# Patient Record
Sex: Male | Born: 1945 | Race: White | Hispanic: No | Marital: Single | Smoking: Former smoker
Health system: Southern US, Community
[De-identification: ages and names within clinical notes are randomized; demographics above are authoritative.]

## PROBLEM LIST (undated history)

## (undated) DIAGNOSIS — I340 Nonrheumatic mitral (valve) insufficiency: Secondary | ICD-10-CM

## (undated) DIAGNOSIS — I10 Essential (primary) hypertension: Secondary | ICD-10-CM

## (undated) DIAGNOSIS — I509 Heart failure, unspecified: Secondary | ICD-10-CM

## (undated) DIAGNOSIS — I5020 Unspecified systolic (congestive) heart failure: Secondary | ICD-10-CM

## (undated) HISTORY — PX: NO PAST SURGERIES: SHX2092

---

## 2020-06-16 DIAGNOSIS — M62830 Muscle spasm of back: Secondary | ICD-10-CM | POA: Diagnosis not present

## 2020-06-16 DIAGNOSIS — M9901 Segmental and somatic dysfunction of cervical region: Secondary | ICD-10-CM | POA: Diagnosis not present

## 2020-06-16 DIAGNOSIS — M9903 Segmental and somatic dysfunction of lumbar region: Secondary | ICD-10-CM | POA: Diagnosis not present

## 2020-06-16 DIAGNOSIS — M5033 Other cervical disc degeneration, cervicothoracic region: Secondary | ICD-10-CM | POA: Diagnosis not present

## 2020-07-07 DIAGNOSIS — M9903 Segmental and somatic dysfunction of lumbar region: Secondary | ICD-10-CM | POA: Diagnosis not present

## 2020-07-07 DIAGNOSIS — M5033 Other cervical disc degeneration, cervicothoracic region: Secondary | ICD-10-CM | POA: Diagnosis not present

## 2020-07-07 DIAGNOSIS — M9901 Segmental and somatic dysfunction of cervical region: Secondary | ICD-10-CM | POA: Diagnosis not present

## 2020-07-07 DIAGNOSIS — M62830 Muscle spasm of back: Secondary | ICD-10-CM | POA: Diagnosis not present

## 2020-07-28 DIAGNOSIS — M62830 Muscle spasm of back: Secondary | ICD-10-CM | POA: Diagnosis not present

## 2020-07-28 DIAGNOSIS — M5033 Other cervical disc degeneration, cervicothoracic region: Secondary | ICD-10-CM | POA: Diagnosis not present

## 2020-07-28 DIAGNOSIS — M9903 Segmental and somatic dysfunction of lumbar region: Secondary | ICD-10-CM | POA: Diagnosis not present

## 2020-07-28 DIAGNOSIS — M9901 Segmental and somatic dysfunction of cervical region: Secondary | ICD-10-CM | POA: Diagnosis not present

## 2020-08-25 DIAGNOSIS — M9903 Segmental and somatic dysfunction of lumbar region: Secondary | ICD-10-CM | POA: Diagnosis not present

## 2020-08-25 DIAGNOSIS — M5033 Other cervical disc degeneration, cervicothoracic region: Secondary | ICD-10-CM | POA: Diagnosis not present

## 2020-08-25 DIAGNOSIS — M9901 Segmental and somatic dysfunction of cervical region: Secondary | ICD-10-CM | POA: Diagnosis not present

## 2020-08-25 DIAGNOSIS — M62830 Muscle spasm of back: Secondary | ICD-10-CM | POA: Diagnosis not present

## 2020-09-22 DIAGNOSIS — M9903 Segmental and somatic dysfunction of lumbar region: Secondary | ICD-10-CM | POA: Diagnosis not present

## 2020-09-22 DIAGNOSIS — M9901 Segmental and somatic dysfunction of cervical region: Secondary | ICD-10-CM | POA: Diagnosis not present

## 2020-09-22 DIAGNOSIS — M62830 Muscle spasm of back: Secondary | ICD-10-CM | POA: Diagnosis not present

## 2020-09-22 DIAGNOSIS — M5033 Other cervical disc degeneration, cervicothoracic region: Secondary | ICD-10-CM | POA: Diagnosis not present

## 2020-10-06 DIAGNOSIS — M62830 Muscle spasm of back: Secondary | ICD-10-CM | POA: Diagnosis not present

## 2020-10-06 DIAGNOSIS — M5033 Other cervical disc degeneration, cervicothoracic region: Secondary | ICD-10-CM | POA: Diagnosis not present

## 2020-10-06 DIAGNOSIS — M9901 Segmental and somatic dysfunction of cervical region: Secondary | ICD-10-CM | POA: Diagnosis not present

## 2020-10-06 DIAGNOSIS — M9903 Segmental and somatic dysfunction of lumbar region: Secondary | ICD-10-CM | POA: Diagnosis not present

## 2020-10-20 DIAGNOSIS — M9903 Segmental and somatic dysfunction of lumbar region: Secondary | ICD-10-CM | POA: Diagnosis not present

## 2020-10-20 DIAGNOSIS — M9901 Segmental and somatic dysfunction of cervical region: Secondary | ICD-10-CM | POA: Diagnosis not present

## 2020-10-20 DIAGNOSIS — M62830 Muscle spasm of back: Secondary | ICD-10-CM | POA: Diagnosis not present

## 2020-10-20 DIAGNOSIS — M5033 Other cervical disc degeneration, cervicothoracic region: Secondary | ICD-10-CM | POA: Diagnosis not present

## 2020-11-17 DIAGNOSIS — M62830 Muscle spasm of back: Secondary | ICD-10-CM | POA: Diagnosis not present

## 2020-11-17 DIAGNOSIS — M9903 Segmental and somatic dysfunction of lumbar region: Secondary | ICD-10-CM | POA: Diagnosis not present

## 2020-11-17 DIAGNOSIS — M9901 Segmental and somatic dysfunction of cervical region: Secondary | ICD-10-CM | POA: Diagnosis not present

## 2020-11-17 DIAGNOSIS — M5033 Other cervical disc degeneration, cervicothoracic region: Secondary | ICD-10-CM | POA: Diagnosis not present

## 2020-12-08 DIAGNOSIS — M62830 Muscle spasm of back: Secondary | ICD-10-CM | POA: Diagnosis not present

## 2020-12-08 DIAGNOSIS — M9903 Segmental and somatic dysfunction of lumbar region: Secondary | ICD-10-CM | POA: Diagnosis not present

## 2020-12-08 DIAGNOSIS — M9901 Segmental and somatic dysfunction of cervical region: Secondary | ICD-10-CM | POA: Diagnosis not present

## 2020-12-08 DIAGNOSIS — M5033 Other cervical disc degeneration, cervicothoracic region: Secondary | ICD-10-CM | POA: Diagnosis not present

## 2021-01-05 DIAGNOSIS — M62830 Muscle spasm of back: Secondary | ICD-10-CM | POA: Diagnosis not present

## 2021-01-05 DIAGNOSIS — M5033 Other cervical disc degeneration, cervicothoracic region: Secondary | ICD-10-CM | POA: Diagnosis not present

## 2021-01-05 DIAGNOSIS — M9903 Segmental and somatic dysfunction of lumbar region: Secondary | ICD-10-CM | POA: Diagnosis not present

## 2021-01-05 DIAGNOSIS — M9901 Segmental and somatic dysfunction of cervical region: Secondary | ICD-10-CM | POA: Diagnosis not present

## 2021-01-26 DIAGNOSIS — M9901 Segmental and somatic dysfunction of cervical region: Secondary | ICD-10-CM | POA: Diagnosis not present

## 2021-01-26 DIAGNOSIS — M9903 Segmental and somatic dysfunction of lumbar region: Secondary | ICD-10-CM | POA: Diagnosis not present

## 2021-01-26 DIAGNOSIS — M62830 Muscle spasm of back: Secondary | ICD-10-CM | POA: Diagnosis not present

## 2021-01-26 DIAGNOSIS — M5033 Other cervical disc degeneration, cervicothoracic region: Secondary | ICD-10-CM | POA: Diagnosis not present

## 2021-02-16 DIAGNOSIS — M9901 Segmental and somatic dysfunction of cervical region: Secondary | ICD-10-CM | POA: Diagnosis not present

## 2021-02-16 DIAGNOSIS — M62830 Muscle spasm of back: Secondary | ICD-10-CM | POA: Diagnosis not present

## 2021-02-16 DIAGNOSIS — M9903 Segmental and somatic dysfunction of lumbar region: Secondary | ICD-10-CM | POA: Diagnosis not present

## 2021-02-16 DIAGNOSIS — M5033 Other cervical disc degeneration, cervicothoracic region: Secondary | ICD-10-CM | POA: Diagnosis not present

## 2021-03-09 DIAGNOSIS — M62830 Muscle spasm of back: Secondary | ICD-10-CM | POA: Diagnosis not present

## 2021-03-09 DIAGNOSIS — M5033 Other cervical disc degeneration, cervicothoracic region: Secondary | ICD-10-CM | POA: Diagnosis not present

## 2021-03-09 DIAGNOSIS — M9903 Segmental and somatic dysfunction of lumbar region: Secondary | ICD-10-CM | POA: Diagnosis not present

## 2021-03-09 DIAGNOSIS — M9901 Segmental and somatic dysfunction of cervical region: Secondary | ICD-10-CM | POA: Diagnosis not present

## 2021-03-30 DIAGNOSIS — M9903 Segmental and somatic dysfunction of lumbar region: Secondary | ICD-10-CM | POA: Diagnosis not present

## 2021-03-30 DIAGNOSIS — M5033 Other cervical disc degeneration, cervicothoracic region: Secondary | ICD-10-CM | POA: Diagnosis not present

## 2021-03-30 DIAGNOSIS — M9901 Segmental and somatic dysfunction of cervical region: Secondary | ICD-10-CM | POA: Diagnosis not present

## 2021-03-30 DIAGNOSIS — M62830 Muscle spasm of back: Secondary | ICD-10-CM | POA: Diagnosis not present

## 2021-04-06 DIAGNOSIS — M5033 Other cervical disc degeneration, cervicothoracic region: Secondary | ICD-10-CM | POA: Diagnosis not present

## 2021-04-06 DIAGNOSIS — M62830 Muscle spasm of back: Secondary | ICD-10-CM | POA: Diagnosis not present

## 2021-04-06 DIAGNOSIS — M9901 Segmental and somatic dysfunction of cervical region: Secondary | ICD-10-CM | POA: Diagnosis not present

## 2021-04-06 DIAGNOSIS — M9903 Segmental and somatic dysfunction of lumbar region: Secondary | ICD-10-CM | POA: Diagnosis not present

## 2021-04-13 DIAGNOSIS — M9903 Segmental and somatic dysfunction of lumbar region: Secondary | ICD-10-CM | POA: Diagnosis not present

## 2021-04-13 DIAGNOSIS — M5033 Other cervical disc degeneration, cervicothoracic region: Secondary | ICD-10-CM | POA: Diagnosis not present

## 2021-04-13 DIAGNOSIS — M62830 Muscle spasm of back: Secondary | ICD-10-CM | POA: Diagnosis not present

## 2021-04-13 DIAGNOSIS — M9901 Segmental and somatic dysfunction of cervical region: Secondary | ICD-10-CM | POA: Diagnosis not present

## 2021-04-20 DIAGNOSIS — M5033 Other cervical disc degeneration, cervicothoracic region: Secondary | ICD-10-CM | POA: Diagnosis not present

## 2021-04-20 DIAGNOSIS — M9903 Segmental and somatic dysfunction of lumbar region: Secondary | ICD-10-CM | POA: Diagnosis not present

## 2021-04-20 DIAGNOSIS — M62830 Muscle spasm of back: Secondary | ICD-10-CM | POA: Diagnosis not present

## 2021-04-20 DIAGNOSIS — M9901 Segmental and somatic dysfunction of cervical region: Secondary | ICD-10-CM | POA: Diagnosis not present

## 2021-05-04 DIAGNOSIS — M5033 Other cervical disc degeneration, cervicothoracic region: Secondary | ICD-10-CM | POA: Diagnosis not present

## 2021-05-04 DIAGNOSIS — M9901 Segmental and somatic dysfunction of cervical region: Secondary | ICD-10-CM | POA: Diagnosis not present

## 2021-05-04 DIAGNOSIS — M9903 Segmental and somatic dysfunction of lumbar region: Secondary | ICD-10-CM | POA: Diagnosis not present

## 2021-05-04 DIAGNOSIS — M62830 Muscle spasm of back: Secondary | ICD-10-CM | POA: Diagnosis not present

## 2021-05-25 DIAGNOSIS — M62830 Muscle spasm of back: Secondary | ICD-10-CM | POA: Diagnosis not present

## 2021-05-25 DIAGNOSIS — M9903 Segmental and somatic dysfunction of lumbar region: Secondary | ICD-10-CM | POA: Diagnosis not present

## 2021-05-25 DIAGNOSIS — M5033 Other cervical disc degeneration, cervicothoracic region: Secondary | ICD-10-CM | POA: Diagnosis not present

## 2021-05-25 DIAGNOSIS — M9901 Segmental and somatic dysfunction of cervical region: Secondary | ICD-10-CM | POA: Diagnosis not present

## 2021-05-26 ENCOUNTER — Ambulatory Visit (INDEPENDENT_AMBULATORY_CARE_PROVIDER_SITE_OTHER): Payer: Medicare Other | Admitting: Nurse Practitioner

## 2021-05-26 ENCOUNTER — Encounter: Payer: Self-pay | Admitting: Nurse Practitioner

## 2021-05-26 ENCOUNTER — Other Ambulatory Visit: Payer: Self-pay

## 2021-05-26 VITALS — BP 154/72 | HR 86 | Temp 97.6°F | Resp 12 | Ht 68.0 in | Wt 150.0 lb

## 2021-05-26 DIAGNOSIS — Z0000 Encounter for general adult medical examination without abnormal findings: Secondary | ICD-10-CM

## 2021-05-26 DIAGNOSIS — M25562 Pain in left knee: Secondary | ICD-10-CM

## 2021-05-26 DIAGNOSIS — H6121 Impacted cerumen, right ear: Secondary | ICD-10-CM | POA: Diagnosis not present

## 2021-05-26 DIAGNOSIS — Z125 Encounter for screening for malignant neoplasm of prostate: Secondary | ICD-10-CM | POA: Diagnosis not present

## 2021-05-26 DIAGNOSIS — G8929 Other chronic pain: Secondary | ICD-10-CM | POA: Diagnosis not present

## 2021-05-26 DIAGNOSIS — M25561 Pain in right knee: Secondary | ICD-10-CM | POA: Diagnosis not present

## 2021-05-26 DIAGNOSIS — Z1211 Encounter for screening for malignant neoplasm of colon: Secondary | ICD-10-CM | POA: Diagnosis not present

## 2021-05-26 LAB — CBC WITH DIFFERENTIAL/PLATELET
Basophils Absolute: 0 10*3/uL (ref 0.0–0.1)
Basophils Relative: 0.8 % (ref 0.0–3.0)
Eosinophils Absolute: 0.1 10*3/uL (ref 0.0–0.7)
Eosinophils Relative: 1.7 % (ref 0.0–5.0)
HCT: 42.9 % (ref 39.0–52.0)
Hemoglobin: 14 g/dL (ref 13.0–17.0)
Lymphocytes Relative: 23.2 % (ref 12.0–46.0)
Lymphs Abs: 1.4 10*3/uL (ref 0.7–4.0)
MCHC: 32.7 g/dL (ref 30.0–36.0)
MCV: 84.3 fl (ref 78.0–100.0)
Monocytes Absolute: 0.6 10*3/uL (ref 0.1–1.0)
Monocytes Relative: 10.9 % (ref 3.0–12.0)
Neutro Abs: 3.7 10*3/uL (ref 1.4–7.7)
Neutrophils Relative %: 63.4 % (ref 43.0–77.0)
Platelets: 308 10*3/uL (ref 150.0–400.0)
RBC: 5.1 Mil/uL (ref 4.22–5.81)
RDW: 15.7 % — ABNORMAL HIGH (ref 11.5–15.5)
WBC: 5.9 10*3/uL (ref 4.0–10.5)

## 2021-05-26 LAB — COMPREHENSIVE METABOLIC PANEL
ALT: 15 U/L (ref 0–53)
AST: 23 U/L (ref 0–37)
Albumin: 4.5 g/dL (ref 3.5–5.2)
Alkaline Phosphatase: 102 U/L (ref 39–117)
BUN: 16 mg/dL (ref 6–23)
CO2: 30 mEq/L (ref 19–32)
Calcium: 9.8 mg/dL (ref 8.4–10.5)
Chloride: 101 mEq/L (ref 96–112)
Creatinine, Ser: 0.73 mg/dL (ref 0.40–1.50)
GFR: 88.9 mL/min (ref >60.00)
Glucose, Bld: 104 mg/dL — ABNORMAL HIGH (ref 70–99)
Potassium: 4.7 mEq/L (ref 3.5–5.1)
Sodium: 138 mEq/L (ref 135–145)
Total Bilirubin: 0.7 mg/dL (ref 0.2–1.2)
Total Protein: 7.9 g/dL (ref 6.0–8.3)

## 2021-05-26 LAB — LIPID PANEL
Cholesterol: 162 mg/dL (ref 0–200)
HDL: 58.5 mg/dL (ref >39.00)
LDL Cholesterol: 87 mg/dL (ref 0–99)
NonHDL: 103.91
Total CHOL/HDL Ratio: 3
Triglycerides: 86 mg/dL (ref 0.0–149.0)
VLDL: 17.2 mg/dL (ref 0.0–40.0)

## 2021-05-26 LAB — PSA, MEDICARE: PSA: 1.05 ng/ml (ref 0.10–4.00)

## 2021-05-26 NOTE — Assessment & Plan Note (Signed)
Discussed age-appropriate immunizations and screening exams. 

## 2021-05-26 NOTE — Assessment & Plan Note (Signed)
Discussed option of colonoscopy, Cologuard, iFOB.  Patient defers any intervention at current time.  He may be interested in Cologuard but states "ask him next time". ?

## 2021-05-26 NOTE — Progress Notes (Addendum)
? ?New Patient Office Visit ? ?Subjective:  ?Patient ID: Walter Vargas, male    DOB: 07-24-1945  Age: 76 y.o. MRN: [id number] ? ?CC:  ?Chief Complaint  ?Patient presents with  ? Establish Care  ?  Previous PCP Dr Merian Capronlmerado with [facility] and Dr Daisy LazarNeymeyer's office  ? ? ?HPI ?Walter CouncilmanJoel C Vargas presents for Establish care ? ?for complete physical and follow up of chronic conditions. ? ?Immunizations: ?-Tetanus: UTD ?-Influenza: refused ?-Covid-19: refused ?-Shingles: refused ?-Pneumonia: refused ? ? ? ?Diet: Fair diet. States that he eats 1.5 meals a day. Will eat salad every other day and bananas. Coffee in the am and then sprite, avoid caffiene. Very little water. ?Exercise: No regular exercise. States that his knees and hips are bothering him. Would walk and do some at home exercises prior to the joint issues ? ?Eye exam:  Within the year and wears glasses ?Dental exam: Completes semi-annually. Walter NovemberMike Vargas  ? ? ?Colonoscopy: Completed in never. does not want a colonoscopy. Did discuss cologuard and iFOB ?Dexa: Completed in never ?PSA: Due ? ?Lung Cancer Screening: NA ? ?Sleep: Goes to bed around 9-930 and get up 4am. Feels rested. Unsure if he snores ? ?Joint pain: Both knees and both hips. Has been bothering him for a while. A year ago it got worse. States that he limbs some and has balance issue. No pain at rest. States that it will hurt with movement. Even with long term sitting he will not hurt ? ? ? ?No past medical history on file. ? ? ? ?Family History  ?Problem Relation Age of Onset  ? Diabetes Mother   ?     gestational diabetes  ? Hypertension Father   ? Alcohol abuse Father   ? Heart disease Brother   ? Alcohol abuse Brother   ? Drug abuse Brother   ? ? ?Social History  ? ?Socioeconomic History  ? Marital status: Married  ?  Spouse name: Not on file  ? Number of children: Not on file  ? Years of education: Not on file  ? Highest education level: Not on file  ?Occupational History  ? Not on file  ?Tobacco Use  ?  Smoking status: Former  ?  Packs/day: 0.50  ?  Years: 10.00  ?  Pack years: 5.00  ?  Types: Cigarettes  ?  Quit date: 291978  ?  Years since quitting: 45.2  ? Smokeless tobacco: Never  ?Vaping Use  ? Vaping Use: Never used  ?Substance and Sexual Activity  ? Alcohol use: Not Currently  ?  Comment: no alcohol since 76 years old  ? Drug use: Never  ? Sexual activity: Not on file  ?Other Topics Concern  ? Not on file  ?Social History Narrative  ? Not on file  ? ?Social Determinants of Health  ? ?Financial Resource Strain: Not on file  ?Food Insecurity: Not on file  ?Transportation Needs: Not on file  ?Physical Activity: Not on file  ?Stress: Not on file  ?Social Connections: Not on file  ?Intimate Partner Violence: Not on file  ? ? ?ROS ?Review of Systems  ?Constitutional:  Negative for chills, fatigue and fever.  ?Respiratory:  Negative for cough and shortness of breath.   ?Cardiovascular:  Negative for chest pain and leg swelling.  ?Gastrointestinal:  Negative for abdominal pain, diarrhea, nausea and vomiting.  ?     BM every other day ?  ?Genitourinary:  Negative for difficulty urinating, dysuria and frequency.  ?  Nocturia "-'  ?Neurological:  Negative for dizziness, weakness, light-headedness, numbness and headaches.  ?Psychiatric/Behavioral:  Negative for hallucinations and suicidal ideas.   ? ?Objective:  ? ?Today's Vitals: BP (!) 154/72   Pulse 86   Temp 97.6 ?F (36.4 ?C)   Resp 12   Ht 5\' 8"  (1.727 m)   Wt 150 lb (68 kg)   SpO2 98%   BMI 22.81 kg/m?  ? ?Physical Exam ?Vitals and nursing note reviewed.  ?Constitutional:   ?   Appearance: Normal appearance.  ?HENT:  ?   Right Ear: Ear canal and external ear normal. There is impacted cerumen.  ?   Left Ear: Tympanic membrane, ear canal and external ear normal. There is impacted cerumen.  ?   Mouth/Throat:  ?   Mouth: Mucous membranes are moist.  ?   Pharynx: Oropharynx is clear.  ?Eyes:  ?   Extraocular Movements: Extraocular movements intact.  ?   Pupils:  Pupils are equal, round, and reactive to light.  ?Cardiovascular:  ?   Rate and Rhythm: Normal rate and regular rhythm.  ?   Pulses: Normal pulses.  ?   Heart sounds: Normal heart sounds.  ?Pulmonary:  ?   Effort: Pulmonary effort is normal.  ?   Breath sounds: Normal breath sounds.  ?Abdominal:  ?   General: Bowel sounds are normal. There is no distension.  ?   Palpations: There is no mass.  ?   Tenderness: There is no abdominal tenderness.  ?   Hernia: A hernia is present.  ?Musculoskeletal:  ?   Right knee: Crepitus present. No bony tenderness. No tenderness.  ?   Left knee: No bony tenderness or crepitus. No tenderness.  ?   Right lower leg: No edema.  ?   Left lower leg: No edema.  ?Lymphadenopathy:  ?   Cervical: No cervical adenopathy.  ?Skin: ?   General: Skin is warm.  ?Neurological:  ?   General: No focal deficit present.  ?   Mental Status: He is alert.  ?Psychiatric:     ?   Mood and Affect: Mood normal.     ?   Behavior: Behavior normal.     ?   Thought Content: Thought content normal.     ?   Judgment: Judgment normal.  ? ? ?Assessment & Plan:  ? ?Problem List Items Addressed This Visit   ? ?  ? Nervous and Auditory  ? Impacted cerumen of right ear  ?  Verbal consent obtained.  Patient was prepared per office policy mixture of water and hydroperoxide was mixed with bilateral ears were irrigated.  Patient tolerated procedure well ?  ?  ? Relevant Orders  ? Ear Lavage  ?  ? Other  ? Chronic pain of both knees  ?  Complaining of bilateral chronic knee pain.  States has been going on for extended period of time but over the past year has increased and worsened.  He did mention that he would like to see Dr. for further evaluation.  He can make an appointment when he leaves today ?  ?  ? Relevant Medications  ? aspirin EC 81 MG tablet  ? Preventative health care - Primary  ?  Discussed age-appropriate immunizations and screening exams. ?  ?  ? Relevant Orders  ? CBC with Differential/Platelet  ?  Comprehensive metabolic panel  ? Lipid panel  ? Screening for colon cancer  ?  Discussed option of colonoscopy, Cologuard,  iFOB.  Patient defers any intervention at current time.  He may be interested in Cologuard but states "ask him next time". ?  ?  ? ?Other Visit Diagnoses   ? ? Screening for prostate cancer      ? Relevant Orders  ? PSA, Medicare  ? ?  ? ? ?Outpatient Encounter Medications as of 05/26/2021  ?Medication Sig  ? Ascorbic Acid (VITAMIN C) 1000 MG tablet Take 1,000 mg by mouth daily.  ? aspirin EC 81 MG tablet Take by mouth daily. Swallow whole. 1/2 TABLET DAILY  ? Multiple Vitamin (MULTIVITAMIN) tablet Take 1 tablet by mouth daily. daily  ? ?No facility-administered encounter medications on file as of 05/26/2021.  ? ? ?Follow-up: Return in about 1 year (around 05/27/2022) for CPE. Make an appointment with Dr. Patsy Lager at check out.  ? ?This visit occurred during the SARS-CoV-2 public health emergency.  Safety protocols were in place, including screening questions prior to the visit, additional usage of staff PPE, and extensive cleaning of exam room while observing appropriate contact time as indicated for disinfecting solutions.  ? ?Audria Nine, NP ? ?

## 2021-05-26 NOTE — Assessment & Plan Note (Signed)
Verbal consent obtained.  Patient was prepared per office policy mixture of water and hydroperoxide was mixed with bilateral ears were irrigated.  Patient tolerated procedure well ?

## 2021-05-26 NOTE — Patient Instructions (Signed)
Nice to see you today.  ?I will be in touch with the lab results once I have them ?Follow up with me in a year, sooner if you need me ? ?When you leave today schedule an appointment with Dr Patsy Lager so he can look at your knees. ?

## 2021-05-26 NOTE — Assessment & Plan Note (Signed)
Complaining of bilateral chronic knee pain.  States has been going on for extended period of time but over the past year has increased and worsened.  He did mention that he would like to see Dr. Patsy Lager for further evaluation.  He can make an appointment when he leaves today ?

## 2021-06-15 DIAGNOSIS — M9901 Segmental and somatic dysfunction of cervical region: Secondary | ICD-10-CM | POA: Diagnosis not present

## 2021-06-15 DIAGNOSIS — M9903 Segmental and somatic dysfunction of lumbar region: Secondary | ICD-10-CM | POA: Diagnosis not present

## 2021-06-15 DIAGNOSIS — M5033 Other cervical disc degeneration, cervicothoracic region: Secondary | ICD-10-CM | POA: Diagnosis not present

## 2021-06-15 DIAGNOSIS — M62830 Muscle spasm of back: Secondary | ICD-10-CM | POA: Diagnosis not present

## 2021-06-29 ENCOUNTER — Ambulatory Visit (INDEPENDENT_AMBULATORY_CARE_PROVIDER_SITE_OTHER)
Admission: RE | Admit: 2021-06-29 | Discharge: 2021-06-29 | Disposition: A | Discharge status: Home / Self Care | Payer: Medicare Other | Source: Ambulatory Visit | Attending: Family Medicine | Admitting: Family Medicine

## 2021-06-29 ENCOUNTER — Ambulatory Visit (INDEPENDENT_AMBULATORY_CARE_PROVIDER_SITE_OTHER): Payer: Medicare Other | Admitting: Family Medicine

## 2021-06-29 ENCOUNTER — Encounter: Payer: Self-pay | Admitting: Family Medicine

## 2021-06-29 VITALS — BP 130/70 | HR 63 | Temp 97.7°F | Ht 68.0 in | Wt 149.5 lb

## 2021-06-29 DIAGNOSIS — M1612 Unilateral primary osteoarthritis, left hip: Secondary | ICD-10-CM

## 2021-06-29 DIAGNOSIS — M170 Bilateral primary osteoarthritis of knee: Secondary | ICD-10-CM

## 2021-06-29 DIAGNOSIS — S73002A Unspecified subluxation of left hip, initial encounter: Secondary | ICD-10-CM | POA: Diagnosis not present

## 2021-06-29 DIAGNOSIS — S73005A Unspecified dislocation of left hip, initial encounter: Secondary | ICD-10-CM | POA: Diagnosis not present

## 2021-06-29 NOTE — Progress Notes (Signed)
? ? ?Samyrah Bruster T. Danyele Smejkal, MD, CAQ Sports Medicine ?Nature conservation officer at [facility] ?[address] [place] ?[place], [address] ? ?Phone: [phone]  FAX: [phone] ? ?Walter Vargas - 76 y.o. male  MRN [id number]  Date of Birth: [birth year] ? ?Date: 06/29/2021  PCP: [name], NP  Referral: [name], NP ? ?Chief Complaint  ?Patient presents with  ? Knee Pain  ?  Bilateral  ? Hip Pain  ?  Left  ? ? ?This visit occurred during the SARS-CoV-2 public health emergency.  Safety protocols were in place, including screening questions prior to the visit, additional usage of staff PPE, and extensive cleaning of exam room while observing appropriate contact time as indicated for disinfecting solutions.  ? ?Subjective:  ? ?Walter Vargas is a 76 y.o. very pleasant male patient with Body mass index is 22.73 kg/m?. who presents with the following: ? ?Patient is here for discussion of bilateral chronic knee pain.  He just presented to our medical office as a new patient to Mr. Cable roughly 1 month ago. ? ?He has never had any prior operative intervention. ? ?L hip pain and hip OA.   While he is having some bilateral knee pain, he does have primary area of pain in the anterior groin on the left.  He currently denies any significant back pain. ? ?He is notably having quite a bit of difficulty putting on a sock, very much difficulty crossing his legs, and while he is able to put on his pants, sometimes this is more difficult.  He also is having trouble getting in and out of a car, and he sits down and then swings his leg. ? ?He is currently limiting his going up and down stairs to take pressure off his left hip.  This is due to hip pain, not knee pain. ? ?Knees are also hurting. ?He does see Dr. Jeannetta Ellis.  ? ?NO back pain ?Anterior groin pain. ? ?Limited going up steps with his L hip. ? ? ? ?Review of Systems is noted in the HPI, as appropriate ? ?Objective:  ? ?BP 130/70   Pulse 63   Temp 97.7 ?F (36.5 ?C)  (Oral)   Ht 5\' 8"  (1.727 m)   Wt 149 lb 8 oz (67.8 kg)   SpO2 98%   BMI 22.73 kg/m?  ? ?GEN: No acute distress; alert,appropriate. ?PULM: Breathing comfortably in no respiratory distress ?PSYCH: Normally interactive.  ? ?He does have significant genu varus. ? ?His knees do lacks roughly 5 degrees of extension bilaterally, but flexion to 120.  No significant joint pain, joint line tenderness, pain with patellar manipulation, flexion, or extension. ? ?At the hip, the patient's right hip has full range of motion, rotates easily with no limitation and no pain with terminal movement, ? ?At the left hip has pain with abduction at roughly 25 degrees. ?Difficulty getting the hip to 90 degrees, but as this approaches, the patient's rotational movement internal/external rotation is roughly 5 degrees. ?Pain with terminal motions in both directions. ?The hip pain he points to is at the anterior, C sign. ? ?Laboratory and Imaging Data: ? ?Assessment and Plan:  ? ?  ICD-10-CM   ?1. Primary osteoarthritis of left hip  M16.12 DG HIP UNILAT WITH PELVIS 2-3 VIEWS LEFT  ?  ?2. Primary osteoarthritis of knees, bilateral  M17.0   ?  ? ?Acute on chronic hip pain with exacerbation. ? ?Xray, hip series ?Indication: pain ?Findings: No occult fracture  or dislocation.  On the left hip, the patient does have complete loss of joint space, extensive subchondral sclerosis, and flattening of the femoral head.  This consistent with end-stage osteoarthritis of the hip. ?Electronically Signed  By: Hannah Beat, MD On: 06/29/2021  9:20 AM EDT  ? ?I think that all of his pain and functional impairment is coming from his left-sided end-stage hip arthritis.  Truly at this point, I think that the only thing that would provide him significant long-lasting relief is a total hip arthroplasty.  At any point, I am happy to refer him to a total joint surgeon. ? ?For now, he will think on things, and use Tylenol and ibuprofen. ? ?Does have some bilateral  knee pain and some occasional pain in stiffness in the knees.  Certainly does have some arthritic changes, but I think that #1 is by far and away the issue here ? ?Social: Functional impairment and certainly loss of functional activity for exercise ? ?Patient Instructions  ?You should be able to take generic ibuprofen orally. (Over the counter Motrin, Advil, or Generic Ibuprofen 200 mg tablets. 4 tablets by mouth 3 times a day. This equals a prescription strength dose.)  ? ?Take Tylenol/Acetaminophen ES (500mg ) 2 tabs by mouth three times a day max as needed.   ? ?No orders of the defined types were placed in this encounter. ? ?There are no discontinued medications. ?Orders Placed This Encounter  ?Procedures  ? DG HIP UNILAT WITH PELVIS 2-3 VIEWS LEFT  ? ? ?Follow-up: No follow-ups on file. ? ?Dr[employer] One speech-to-text software was used for transcription in this dictation.  Possible transcriptional errors can occur using .  ? ?Signed, ? ?Ezequias Lard T. Erving Sassano, MD ? ? ?Outpatient Encounter Medications as of 06/29/2021  ?Medication Sig  ? Ascorbic Acid (VITAMIN C) 1000 MG tablet Take 1,000 mg by mouth daily.  ? aspirin EC 81 MG tablet Take by mouth daily. Swallow whole. 1/2 TABLET DAILY  ? Multiple Vitamin (MULTIVITAMIN) tablet Take 1 tablet by mouth daily. daily  ? ?No facility-administered encounter medications on file as of 06/29/2021.  ?  ?

## 2021-06-29 NOTE — Patient Instructions (Signed)
You should be able to take generic ibuprofen orally. (Over the counter Motrin, Advil, or Generic Ibuprofen 200 mg tablets. 4 tablets by mouth 3 times a day. This equals a prescription strength dose.)  ? ?Take Tylenol/Acetaminophen ES (500mg ) 2 tabs by mouth three times a day max as needed.  ?

## 2021-07-06 DIAGNOSIS — M9901 Segmental and somatic dysfunction of cervical region: Secondary | ICD-10-CM | POA: Diagnosis not present

## 2021-07-06 DIAGNOSIS — M62830 Muscle spasm of back: Secondary | ICD-10-CM | POA: Diagnosis not present

## 2021-07-06 DIAGNOSIS — M9903 Segmental and somatic dysfunction of lumbar region: Secondary | ICD-10-CM | POA: Diagnosis not present

## 2021-07-06 DIAGNOSIS — M5033 Other cervical disc degeneration, cervicothoracic region: Secondary | ICD-10-CM | POA: Diagnosis not present

## 2021-07-27 DIAGNOSIS — M62830 Muscle spasm of back: Secondary | ICD-10-CM | POA: Diagnosis not present

## 2021-07-27 DIAGNOSIS — M9901 Segmental and somatic dysfunction of cervical region: Secondary | ICD-10-CM | POA: Diagnosis not present

## 2021-07-27 DIAGNOSIS — M5033 Other cervical disc degeneration, cervicothoracic region: Secondary | ICD-10-CM | POA: Diagnosis not present

## 2021-07-27 DIAGNOSIS — M9903 Segmental and somatic dysfunction of lumbar region: Secondary | ICD-10-CM | POA: Diagnosis not present

## 2021-07-29 DIAGNOSIS — H25813 Combined forms of age-related cataract, bilateral: Secondary | ICD-10-CM | POA: Diagnosis not present

## 2021-08-22 DIAGNOSIS — M9903 Segmental and somatic dysfunction of lumbar region: Secondary | ICD-10-CM | POA: Diagnosis not present

## 2021-08-22 DIAGNOSIS — M9901 Segmental and somatic dysfunction of cervical region: Secondary | ICD-10-CM | POA: Diagnosis not present

## 2021-08-22 DIAGNOSIS — M62830 Muscle spasm of back: Secondary | ICD-10-CM | POA: Diagnosis not present

## 2021-08-22 DIAGNOSIS — M5033 Other cervical disc degeneration, cervicothoracic region: Secondary | ICD-10-CM | POA: Diagnosis not present

## 2021-09-14 DIAGNOSIS — M9903 Segmental and somatic dysfunction of lumbar region: Secondary | ICD-10-CM | POA: Diagnosis not present

## 2021-09-14 DIAGNOSIS — M9901 Segmental and somatic dysfunction of cervical region: Secondary | ICD-10-CM | POA: Diagnosis not present

## 2021-09-14 DIAGNOSIS — M5033 Other cervical disc degeneration, cervicothoracic region: Secondary | ICD-10-CM | POA: Diagnosis not present

## 2021-09-14 DIAGNOSIS — M62830 Muscle spasm of back: Secondary | ICD-10-CM | POA: Diagnosis not present

## 2021-10-05 DIAGNOSIS — M9901 Segmental and somatic dysfunction of cervical region: Secondary | ICD-10-CM | POA: Diagnosis not present

## 2021-10-05 DIAGNOSIS — M62830 Muscle spasm of back: Secondary | ICD-10-CM | POA: Diagnosis not present

## 2021-10-05 DIAGNOSIS — M5033 Other cervical disc degeneration, cervicothoracic region: Secondary | ICD-10-CM | POA: Diagnosis not present

## 2021-10-05 DIAGNOSIS — M9903 Segmental and somatic dysfunction of lumbar region: Secondary | ICD-10-CM | POA: Diagnosis not present

## 2021-10-26 DIAGNOSIS — M62830 Muscle spasm of back: Secondary | ICD-10-CM | POA: Diagnosis not present

## 2021-10-26 DIAGNOSIS — M5033 Other cervical disc degeneration, cervicothoracic region: Secondary | ICD-10-CM | POA: Diagnosis not present

## 2021-10-26 DIAGNOSIS — M9903 Segmental and somatic dysfunction of lumbar region: Secondary | ICD-10-CM | POA: Diagnosis not present

## 2021-10-26 DIAGNOSIS — M9901 Segmental and somatic dysfunction of cervical region: Secondary | ICD-10-CM | POA: Diagnosis not present

## 2021-11-16 DIAGNOSIS — M5033 Other cervical disc degeneration, cervicothoracic region: Secondary | ICD-10-CM | POA: Diagnosis not present

## 2021-11-16 DIAGNOSIS — M9901 Segmental and somatic dysfunction of cervical region: Secondary | ICD-10-CM | POA: Diagnosis not present

## 2021-11-16 DIAGNOSIS — M62830 Muscle spasm of back: Secondary | ICD-10-CM | POA: Diagnosis not present

## 2021-11-16 DIAGNOSIS — M9903 Segmental and somatic dysfunction of lumbar region: Secondary | ICD-10-CM | POA: Diagnosis not present

## 2021-12-07 DIAGNOSIS — M5033 Other cervical disc degeneration, cervicothoracic region: Secondary | ICD-10-CM | POA: Diagnosis not present

## 2021-12-07 DIAGNOSIS — M9903 Segmental and somatic dysfunction of lumbar region: Secondary | ICD-10-CM | POA: Diagnosis not present

## 2021-12-07 DIAGNOSIS — M9901 Segmental and somatic dysfunction of cervical region: Secondary | ICD-10-CM | POA: Diagnosis not present

## 2021-12-07 DIAGNOSIS — M62830 Muscle spasm of back: Secondary | ICD-10-CM | POA: Diagnosis not present

## 2021-12-20 ENCOUNTER — Telehealth: Payer: Self-pay | Admitting: Nurse Practitioner

## 2021-12-20 NOTE — Telephone Encounter (Signed)
No answer unable to leave a message for patient to call back and schedule Medicare Annual Wellness Visit (AWV).  [facility] offer to do virtually or by telephone.   No hx of AWV - eligible as of 09/11/11   Please schedule at anytime with [facility].   45 minute appointment  Any questions, please contact me at [phone]

## 2021-12-21 DIAGNOSIS — M62830 Muscle spasm of back: Secondary | ICD-10-CM | POA: Diagnosis not present

## 2021-12-21 DIAGNOSIS — M9903 Segmental and somatic dysfunction of lumbar region: Secondary | ICD-10-CM | POA: Diagnosis not present

## 2021-12-21 DIAGNOSIS — M5033 Other cervical disc degeneration, cervicothoracic region: Secondary | ICD-10-CM | POA: Diagnosis not present

## 2021-12-21 DIAGNOSIS — M9901 Segmental and somatic dysfunction of cervical region: Secondary | ICD-10-CM | POA: Diagnosis not present

## 2022-01-11 DIAGNOSIS — M9901 Segmental and somatic dysfunction of cervical region: Secondary | ICD-10-CM | POA: Diagnosis not present

## 2022-01-11 DIAGNOSIS — M62830 Muscle spasm of back: Secondary | ICD-10-CM | POA: Diagnosis not present

## 2022-01-11 DIAGNOSIS — M5033 Other cervical disc degeneration, cervicothoracic region: Secondary | ICD-10-CM | POA: Diagnosis not present

## 2022-01-11 DIAGNOSIS — M9903 Segmental and somatic dysfunction of lumbar region: Secondary | ICD-10-CM | POA: Diagnosis not present

## 2022-01-16 ENCOUNTER — Telehealth: Payer: Self-pay | Admitting: Nurse Practitioner

## 2022-01-16 NOTE — Telephone Encounter (Signed)
LVM for pt to rtn my call to schedule AWV-I with [facility] call back # [phone] 

## 2022-02-01 DIAGNOSIS — M9903 Segmental and somatic dysfunction of lumbar region: Secondary | ICD-10-CM | POA: Diagnosis not present

## 2022-02-01 DIAGNOSIS — M5033 Other cervical disc degeneration, cervicothoracic region: Secondary | ICD-10-CM | POA: Diagnosis not present

## 2022-02-01 DIAGNOSIS — M9901 Segmental and somatic dysfunction of cervical region: Secondary | ICD-10-CM | POA: Diagnosis not present

## 2022-02-01 DIAGNOSIS — M62830 Muscle spasm of back: Secondary | ICD-10-CM | POA: Diagnosis not present

## 2022-02-27 DIAGNOSIS — M9903 Segmental and somatic dysfunction of lumbar region: Secondary | ICD-10-CM | POA: Diagnosis not present

## 2022-02-27 DIAGNOSIS — M62830 Muscle spasm of back: Secondary | ICD-10-CM | POA: Diagnosis not present

## 2022-02-27 DIAGNOSIS — M9901 Segmental and somatic dysfunction of cervical region: Secondary | ICD-10-CM | POA: Diagnosis not present

## 2022-02-27 DIAGNOSIS — M5033 Other cervical disc degeneration, cervicothoracic region: Secondary | ICD-10-CM | POA: Diagnosis not present

## 2022-03-01 DIAGNOSIS — M62830 Muscle spasm of back: Secondary | ICD-10-CM | POA: Diagnosis not present

## 2022-03-01 DIAGNOSIS — M5033 Other cervical disc degeneration, cervicothoracic region: Secondary | ICD-10-CM | POA: Diagnosis not present

## 2022-03-01 DIAGNOSIS — M9903 Segmental and somatic dysfunction of lumbar region: Secondary | ICD-10-CM | POA: Diagnosis not present

## 2022-03-01 DIAGNOSIS — M9901 Segmental and somatic dysfunction of cervical region: Secondary | ICD-10-CM | POA: Diagnosis not present

## 2022-03-22 DIAGNOSIS — M5033 Other cervical disc degeneration, cervicothoracic region: Secondary | ICD-10-CM | POA: Diagnosis not present

## 2022-03-22 DIAGNOSIS — M9903 Segmental and somatic dysfunction of lumbar region: Secondary | ICD-10-CM | POA: Diagnosis not present

## 2022-03-22 DIAGNOSIS — M62830 Muscle spasm of back: Secondary | ICD-10-CM | POA: Diagnosis not present

## 2022-03-22 DIAGNOSIS — M9901 Segmental and somatic dysfunction of cervical region: Secondary | ICD-10-CM | POA: Diagnosis not present

## 2022-04-12 DIAGNOSIS — M5033 Other cervical disc degeneration, cervicothoracic region: Secondary | ICD-10-CM | POA: Diagnosis not present

## 2022-04-12 DIAGNOSIS — M62830 Muscle spasm of back: Secondary | ICD-10-CM | POA: Diagnosis not present

## 2022-04-12 DIAGNOSIS — M9901 Segmental and somatic dysfunction of cervical region: Secondary | ICD-10-CM | POA: Diagnosis not present

## 2022-04-12 DIAGNOSIS — M9903 Segmental and somatic dysfunction of lumbar region: Secondary | ICD-10-CM | POA: Diagnosis not present

## 2022-05-10 DIAGNOSIS — M9901 Segmental and somatic dysfunction of cervical region: Secondary | ICD-10-CM | POA: Diagnosis not present

## 2022-05-10 DIAGNOSIS — M5033 Other cervical disc degeneration, cervicothoracic region: Secondary | ICD-10-CM | POA: Diagnosis not present

## 2022-05-10 DIAGNOSIS — M62830 Muscle spasm of back: Secondary | ICD-10-CM | POA: Diagnosis not present

## 2022-05-10 DIAGNOSIS — M9903 Segmental and somatic dysfunction of lumbar region: Secondary | ICD-10-CM | POA: Diagnosis not present

## 2022-05-31 DIAGNOSIS — M9903 Segmental and somatic dysfunction of lumbar region: Secondary | ICD-10-CM | POA: Diagnosis not present

## 2022-05-31 DIAGNOSIS — M62830 Muscle spasm of back: Secondary | ICD-10-CM | POA: Diagnosis not present

## 2022-05-31 DIAGNOSIS — M5033 Other cervical disc degeneration, cervicothoracic region: Secondary | ICD-10-CM | POA: Diagnosis not present

## 2022-05-31 DIAGNOSIS — M9901 Segmental and somatic dysfunction of cervical region: Secondary | ICD-10-CM | POA: Diagnosis not present

## 2022-06-21 ENCOUNTER — Encounter: Payer: Self-pay | Admitting: Nurse Practitioner

## 2022-06-21 ENCOUNTER — Ambulatory Visit (INDEPENDENT_AMBULATORY_CARE_PROVIDER_SITE_OTHER): Payer: 59 | Admitting: Nurse Practitioner

## 2022-06-21 VITALS — BP 160/92 | HR 79 | Temp 97.9°F | Resp 16 | Ht 68.0 in | Wt 150.5 lb

## 2022-06-21 DIAGNOSIS — Z5320 Procedure and treatment not carried out because of patient's decision for unspecified reasons: Secondary | ICD-10-CM

## 2022-06-21 DIAGNOSIS — L84 Corns and callosities: Secondary | ICD-10-CM | POA: Diagnosis not present

## 2022-06-21 DIAGNOSIS — M25561 Pain in right knee: Secondary | ICD-10-CM | POA: Diagnosis not present

## 2022-06-21 DIAGNOSIS — G8929 Other chronic pain: Secondary | ICD-10-CM | POA: Diagnosis not present

## 2022-06-21 DIAGNOSIS — R030 Elevated blood-pressure reading, without diagnosis of hypertension: Secondary | ICD-10-CM | POA: Diagnosis not present

## 2022-06-21 DIAGNOSIS — K429 Umbilical hernia without obstruction or gangrene: Secondary | ICD-10-CM | POA: Insufficient documentation

## 2022-06-21 DIAGNOSIS — Z125 Encounter for screening for malignant neoplasm of prostate: Secondary | ICD-10-CM

## 2022-06-21 DIAGNOSIS — Z0001 Encounter for general adult medical examination with abnormal findings: Secondary | ICD-10-CM | POA: Diagnosis not present

## 2022-06-21 DIAGNOSIS — M25562 Pain in left knee: Secondary | ICD-10-CM

## 2022-06-21 DIAGNOSIS — Z0000 Encounter for general adult medical examination without abnormal findings: Secondary | ICD-10-CM | POA: Diagnosis not present

## 2022-06-21 DIAGNOSIS — Z13220 Encounter for screening for lipoid disorders: Secondary | ICD-10-CM

## 2022-06-21 LAB — COMPREHENSIVE METABOLIC PANEL
ALT: 14 U/L (ref 0–53)
AST: 24 U/L (ref 0–37)
Albumin: 4.4 g/dL (ref 3.5–5.2)
Alkaline Phosphatase: 83 U/L (ref 39–117)
BUN: 11 mg/dL (ref 6–23)
CO2: 27 mEq/L (ref 19–32)
Calcium: 9.5 mg/dL (ref 8.4–10.5)
Chloride: 104 mEq/L (ref 96–112)
Creatinine, Ser: 0.66 mg/dL (ref 0.40–1.50)
GFR: 90.96 mL/min (ref >60.00)
Glucose, Bld: 88 mg/dL (ref 70–99)
Potassium: 4.3 mEq/L (ref 3.5–5.1)
Sodium: 141 mEq/L (ref 135–145)
Total Bilirubin: 0.7 mg/dL (ref 0.2–1.2)
Total Protein: 7.3 g/dL (ref 6.0–8.3)

## 2022-06-21 LAB — LIPID PANEL
Cholesterol: 170 mg/dL (ref 0–200)
HDL: 55.1 mg/dL (ref >39.00)
LDL Cholesterol: 100 mg/dL — ABNORMAL HIGH (ref 0–99)
NonHDL: 114.9
Total CHOL/HDL Ratio: 3
Triglycerides: 77 mg/dL (ref 0.0–149.0)
VLDL: 15.4 mg/dL (ref 0.0–40.0)

## 2022-06-21 LAB — CBC
HCT: 43 % (ref 39.0–52.0)
Hemoglobin: 14 g/dL (ref 13.0–17.0)
MCHC: 32.5 g/dL (ref 30.0–36.0)
MCV: 84 fl (ref 78.0–100.0)
Platelets: 302 10*3/uL (ref 150.0–400.0)
RBC: 5.12 Mil/uL (ref 4.22–5.81)
RDW: 17.2 % — ABNORMAL HIGH (ref 11.5–15.5)
WBC: 5.6 10*3/uL (ref 4.0–10.5)

## 2022-06-21 LAB — PSA, MEDICARE: PSA: 0.74 ng/ml (ref 0.10–4.00)

## 2022-06-21 NOTE — Assessment & Plan Note (Signed)
Patient is still being followed by orthopedist continue following with orthopedist as recommended.

## 2022-06-21 NOTE — Assessment & Plan Note (Signed)
Discussed age-appropriate immunizations and screening exams.  Did review patient's personal, surgical, family, and social history.  Patient states he is up-to-date on tetanus vaccine he refuses all other vaccinations.  Patient also refuses CRC screening.  Will draw PSA for prostate cancer screening.  Patient was given information at discharge about preventative healthcare maintenance with anticipatory guidance for his age range.

## 2022-06-21 NOTE — Progress Notes (Signed)
Established Patient Office Visit  Subjective   Patient ID: Walter Vargas, male    DOB: Jul 26, 1945  Age: 77 y.o. MRN: [id number]  Chief Complaint  Patient presents with   Hypertension      Elevated blood pressure: Patient history of elevated blood pressure.  Was elevated at last office visit more so today and even upon recheck.  Patient not currently on any medication  Hernia:  states that he has a hernia around the naval for approx 4-5 years. Does not bother him per his report.  for complete physical and follow up of chronic conditions.  Immunizations: -Tetanus: utd -Influenza: refused -Shingles: refused -Pneumonia: refused  Diet: Fair diet. States that he is a little breakfast and dinner. States that he does have snacks. Sprite and water and coffee. 1 cup in the am   Exercise: No regular exercise.  Eye exam: Completes annually. Glasses new glasses. With in the year. Progressive lenses  Dental exam: Completes semi-annually    Colonoscopy: refused Lung Cancer Screening:  NA  PSA: Due  Sleep; goes to bed aroun 8-9 and get up 4am. Feels rested. States that he may snore       Review of Systems  Constitutional:  Negative for chills and fever.  Eyes:  Negative for blurred vision, double vision and pain.  Respiratory:  Negative for shortness of breath.   Cardiovascular:  Negative for chest pain and leg swelling.  Gastrointestinal:  Negative for abdominal pain, blood in stool, constipation, diarrhea, nausea and vomiting.       BM daily to every other   Genitourinary:  Negative for dysuria and hematuria.  Neurological:  Negative for tingling and headaches.  Psychiatric/Behavioral:  Negative for hallucinations and suicidal ideas.       Objective:     BP (!) 160/92   Pulse 79   Temp 97.9 F (36.6 C)   Resp 16   Ht 5\' 8"  (1.727 m)   Wt 150 lb 8 oz (68.3 kg)   SpO2 99%   BMI 22.88 kg/m  BP Readings from Last 3 Encounters:  06/21/22 (!) 160/92  06/29/21 130/70   05/26/21 (!) 154/72   Wt Readings from Last 3 Encounters:  06/21/22 150 lb 8 oz (68.3 kg)  06/29/21 149 lb 8 oz (67.8 kg)  05/26/21 150 lb (68 kg)      Physical Exam Vitals and nursing note reviewed.  Constitutional:      Appearance: Normal appearance.  HENT:     Right Ear: Tympanic membrane, ear canal and external ear normal.     Left Ear: Tympanic membrane, ear canal and external ear normal.     Mouth/Throat:     Mouth: Mucous membranes are moist.     Pharynx: Oropharynx is clear.  Eyes:     Extraocular Movements: Extraocular movements intact.     Pupils: Pupils are equal, round, and reactive to light.  Cardiovascular:     Rate and Rhythm: Normal rate and regular rhythm.     Pulses: Normal pulses.     Heart sounds: Normal heart sounds.  Pulmonary:     Effort: Pulmonary effort is normal.     Breath sounds: Normal breath sounds.  Abdominal:     General: Bowel sounds are normal. There is no distension.     Palpations: There is no mass.     Tenderness: There is no abdominal tenderness.     Hernia: A hernia is present.  Musculoskeletal:     Right lower  leg: No edema.     Left lower leg: No edema.  Lymphadenopathy:     Cervical: No cervical adenopathy.  Skin:    General: Skin is warm.  Neurological:     General: No focal deficit present.     Mental Status: He is alert.     Deep Tendon Reflexes:     Reflex Scores:      Bicep reflexes are 2+ on the right side and 2+ on the left side.      Patellar reflexes are 2+ on the right side and 2+ on the left side.    Comments: Bilateral upper and lower extremity strength 5/5  Psychiatric:        Mood and Affect: Mood normal.        Behavior: Behavior normal.        Thought Content: Thought content normal.        Judgment: Judgment normal.      No results found for any visits on 06/21/22.    The 10-year ASCVD risk score (Arnett DK, et al., 2019) is: 33%    Assessment & Plan:   Problem List Items Addressed This  Visit       Musculoskeletal and Integument   Callus    2 located on the left plantar ball of feet.  Patient would like to see podiatry ambulatory referral placed      Relevant Orders   Ambulatory referral to Podiatry     Other   Chronic pain of both knees    Patient is still being followed by orthopedist continue following with orthopedist as recommended.      Preventative health care - Primary    Discussed age-appropriate immunizations and screening exams.  Did review patient's personal, surgical, family, and social history.  Patient states he is up-to-date on tetanus vaccine he refuses all other vaccinations.  Patient also refuses CRC screening.  Will draw PSA for prostate cancer screening.  Patient was given information at discharge about preventative healthcare maintenance with anticipatory guidance for his age range.      Elevated blood pressure reading    Elevated blood pressure x 2.  Does have morning between that was normotensive.  Patient is not on any blood pressure medications at this juncture.  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  Not incarcerated.  Did discuss watchful waiting versus elective versus emergent repair.  Patient will think on it as it would require general anesthesia.      Other Visit Diagnoses     Colonoscopy refused       Screening for lipid disorders       Relevant Orders   Lipid panel   Screening for prostate cancer       Relevant Orders   PSA, Medicare       Return in about 3 months (around 09/20/2022) for BP recheck.    Audria Nine, NP

## 2022-06-21 NOTE — Patient Instructions (Signed)
Nice to see you today See me in 3 months for a blood pressure check  I will be in touch with the labs once I have reviewed them

## 2022-06-21 NOTE — Assessment & Plan Note (Signed)
Elevated blood pressure x 2.  Does have morning between that was normotensive.  Patient is not on any blood pressure medications at this juncture.  Will have him follow-up in 3 months to see how blood pressure is doing if still elevated encourage patient on amlodipine 2.5 mg

## 2022-06-21 NOTE — Assessment & Plan Note (Signed)
2 located on the left plantar ball of feet.  Patient would like to see podiatry ambulatory referral placed

## 2022-06-21 NOTE — Assessment & Plan Note (Signed)
Reducible in office but came right back out once patient stood up.  Not incarcerated.  Did discuss watchful waiting versus elective versus emergent repair.  Patient will think on it as it would require general anesthesia.

## 2022-06-21 NOTE — Progress Notes (Signed)
Established Patient Office Visit  Subjective   Patient ID: Walter Vargas, male    DOB: Jul 26, 1945  Age: 77 y.o. MRN: [id number]  Chief Complaint  Patient presents with   Hypertension      Elevated blood pressure: Patient history of elevated blood pressure.  Was elevated at last office visit more so today and even upon recheck.  Patient not currently on any medication  Hernia:  states that he has a hernia around the naval for approx 4-5 years. Does not bother him per his report.  for complete physical and follow up of chronic conditions.  Immunizations: -Tetanus: utd -Influenza: refused -Shingles: refused -Pneumonia: refused  Diet: Fair diet. States that he is a little breakfast and dinner. States that he does have snacks. Sprite and water and coffee. 1 cup in the am   Exercise: No regular exercise.  Eye exam: Completes annually. Glasses new glasses. With in the year. Progressive lenses  Dental exam: Completes semi-annually    Colonoscopy: refused Lung Cancer Screening:  NA  PSA: Due  Sleep; goes to bed aroun 8-9 and get up 4am. Feels rested. States that he may snore       Review of Systems  Constitutional:  Negative for chills and fever.  Eyes:  Negative for blurred vision, double vision and pain.  Respiratory:  Negative for shortness of breath.   Cardiovascular:  Negative for chest pain and leg swelling.  Gastrointestinal:  Negative for abdominal pain, blood in stool, constipation, diarrhea, nausea and vomiting.       BM daily to every other   Genitourinary:  Negative for dysuria and hematuria.  Neurological:  Negative for tingling and headaches.  Psychiatric/Behavioral:  Negative for hallucinations and suicidal ideas.       Objective:     BP (!) 160/92   Pulse 79   Temp 97.9 F (36.6 C)   Resp 16   Ht 5\' 8"  (1.727 m)   Wt 150 lb 8 oz (68.3 kg)   SpO2 99%   BMI 22.88 kg/m  BP Readings from Last 3 Encounters:  06/21/22 (!) 160/92  06/29/21 130/70   05/26/21 (!) 154/72   Wt Readings from Last 3 Encounters:  06/21/22 150 lb 8 oz (68.3 kg)  06/29/21 149 lb 8 oz (67.8 kg)  05/26/21 150 lb (68 kg)      Physical Exam Vitals and nursing note reviewed.  Constitutional:      Appearance: Normal appearance.  HENT:     Right Ear: Tympanic membrane, ear canal and external ear normal.     Left Ear: Tympanic membrane, ear canal and external ear normal.     Mouth/Throat:     Mouth: Mucous membranes are moist.     Pharynx: Oropharynx is clear.  Eyes:     Extraocular Movements: Extraocular movements intact.     Pupils: Pupils are equal, round, and reactive to light.  Cardiovascular:     Rate and Rhythm: Normal rate and regular rhythm.     Pulses: Normal pulses.     Heart sounds: Normal heart sounds.  Pulmonary:     Effort: Pulmonary effort is normal.     Breath sounds: Normal breath sounds.  Abdominal:     General: Bowel sounds are normal. There is no distension.     Palpations: There is no mass.     Tenderness: There is no abdominal tenderness.     Hernia: A hernia is present.  Musculoskeletal:     Right lower  leg: No edema.     Left lower leg: No edema.  Lymphadenopathy:     Cervical: No cervical adenopathy.  Skin:    General: Skin is warm.  Neurological:     General: No focal deficit present.     Mental Status: He is alert.     Deep Tendon Reflexes:     Reflex Scores:      Bicep reflexes are 2+ on the right side and 2+ on the left side.      Patellar reflexes are 2+ on the right side and 2+ on the left side.    Comments: Bilateral upper and lower extremity strength 5/5  Psychiatric:        Mood and Affect: Mood normal.        Behavior: Behavior normal.        Thought Content: Thought content normal.        Judgment: Judgment normal.      No results found for any visits on 06/21/22.    The 10-year ASCVD risk score (Arnett DK, et al., 2019) is: 33%    Assessment & Plan:   Problem List Items Addressed This  Visit       Other   Chronic pain of both knees   Preventative health care - Primary   Relevant Orders   CBC   Comprehensive metabolic panel   Other Visit Diagnoses     Elevated blood pressure reading       Colonoscopy refused       Screening for lipid disorders       Relevant Orders   Lipid panel   Screening for prostate cancer       Relevant Orders   PSA, Medicare   Callus       Relevant Orders   Ambulatory referral to Podiatry   Umbilical hernia without obstruction and without gangrene       Primary hypertension       Relevant Orders   CBC   Comprehensive metabolic panel   Lipid panel       Return in about 3 months (around 09/20/2022) for BP recheck.    Audria Nine, NP

## 2022-06-23 ENCOUNTER — Telehealth: Payer: Self-pay | Admitting: Nurse Practitioner

## 2022-06-23 NOTE — Telephone Encounter (Signed)
Spoke to pt

## 2022-06-23 NOTE — Telephone Encounter (Signed)
Patient returning call re: lab results Please call him back at [phone]

## 2022-06-28 DIAGNOSIS — M62830 Muscle spasm of back: Secondary | ICD-10-CM | POA: Diagnosis not present

## 2022-06-28 DIAGNOSIS — M9903 Segmental and somatic dysfunction of lumbar region: Secondary | ICD-10-CM | POA: Diagnosis not present

## 2022-06-28 DIAGNOSIS — M9901 Segmental and somatic dysfunction of cervical region: Secondary | ICD-10-CM | POA: Diagnosis not present

## 2022-06-28 DIAGNOSIS — M5033 Other cervical disc degeneration, cervicothoracic region: Secondary | ICD-10-CM | POA: Diagnosis not present

## 2022-07-04 DIAGNOSIS — M79672 Pain in left foot: Secondary | ICD-10-CM | POA: Diagnosis not present

## 2022-07-04 DIAGNOSIS — D2372 Other benign neoplasm of skin of left lower limb, including hip: Secondary | ICD-10-CM | POA: Diagnosis not present

## 2022-07-26 DIAGNOSIS — M62830 Muscle spasm of back: Secondary | ICD-10-CM | POA: Diagnosis not present

## 2022-07-26 DIAGNOSIS — M9903 Segmental and somatic dysfunction of lumbar region: Secondary | ICD-10-CM | POA: Diagnosis not present

## 2022-07-26 DIAGNOSIS — M5033 Other cervical disc degeneration, cervicothoracic region: Secondary | ICD-10-CM | POA: Diagnosis not present

## 2022-07-26 DIAGNOSIS — M9901 Segmental and somatic dysfunction of cervical region: Secondary | ICD-10-CM | POA: Diagnosis not present

## 2022-08-16 DIAGNOSIS — M9901 Segmental and somatic dysfunction of cervical region: Secondary | ICD-10-CM | POA: Diagnosis not present

## 2022-08-16 DIAGNOSIS — M5033 Other cervical disc degeneration, cervicothoracic region: Secondary | ICD-10-CM | POA: Diagnosis not present

## 2022-08-16 DIAGNOSIS — M62830 Muscle spasm of back: Secondary | ICD-10-CM | POA: Diagnosis not present

## 2022-08-16 DIAGNOSIS — M9903 Segmental and somatic dysfunction of lumbar region: Secondary | ICD-10-CM | POA: Diagnosis not present

## 2022-09-06 DIAGNOSIS — M5033 Other cervical disc degeneration, cervicothoracic region: Secondary | ICD-10-CM | POA: Diagnosis not present

## 2022-09-06 DIAGNOSIS — M9901 Segmental and somatic dysfunction of cervical region: Secondary | ICD-10-CM | POA: Diagnosis not present

## 2022-09-06 DIAGNOSIS — M62830 Muscle spasm of back: Secondary | ICD-10-CM | POA: Diagnosis not present

## 2022-09-06 DIAGNOSIS — M9903 Segmental and somatic dysfunction of lumbar region: Secondary | ICD-10-CM | POA: Diagnosis not present

## 2022-10-10 ENCOUNTER — Telehealth: Payer: Self-pay | Admitting: Nurse Practitioner

## 2022-10-10 DIAGNOSIS — M9901 Segmental and somatic dysfunction of cervical region: Secondary | ICD-10-CM | POA: Diagnosis not present

## 2022-10-10 DIAGNOSIS — M62830 Muscle spasm of back: Secondary | ICD-10-CM | POA: Diagnosis not present

## 2022-10-10 DIAGNOSIS — M9903 Segmental and somatic dysfunction of lumbar region: Secondary | ICD-10-CM | POA: Diagnosis not present

## 2022-10-10 DIAGNOSIS — M5033 Other cervical disc degeneration, cervicothoracic region: Secondary | ICD-10-CM | POA: Diagnosis not present

## 2022-10-10 NOTE — Telephone Encounter (Signed)
Noted appreciate Dr. Alphonsus Sias evaluating the patient

## 2022-10-10 NOTE — Telephone Encounter (Signed)
Pt has an appointment scheduled tomorrow 10/11/22 @ 11:15a with Dr. Alphonsus Sias.  Has a dental appointment today that he doesn't want to miss.

## 2022-10-10 NOTE — Telephone Encounter (Signed)
Walter Vargas It has been going on for a week--so hopefully the delay is not a problem

## 2022-10-10 NOTE — Telephone Encounter (Signed)
FYI: This call has been transferred to Access Nurse. Once the result note has been entered staff can address the message at that time.  Patient called in with the following symptoms:  Red Word: sob   Please advise at Mobile [phone] (mobile)  Message is routed to Provider Pool and [facility] Triage     Pt called requesting a appt for some breathing issues he's been experiencing here & there along with fatigue. No available appt today, scheduled pt with Dr. Alphonsus Sias for tomorrow, 7/30 @ 11:15am. Transferred pt to access nurse.

## 2022-10-11 ENCOUNTER — Ambulatory Visit: Payer: 59 | Admitting: Internal Medicine

## 2022-10-12 ENCOUNTER — Ambulatory Visit
Admission: RE | Admit: 2022-10-12 | Discharge: 2022-10-12 | Disposition: A | Discharge status: Home / Self Care | Payer: 59 | Source: Ambulatory Visit | Attending: Nurse Practitioner | Admitting: Nurse Practitioner

## 2022-10-12 ENCOUNTER — Ambulatory Visit (INDEPENDENT_AMBULATORY_CARE_PROVIDER_SITE_OTHER): Payer: 59 | Admitting: Nurse Practitioner

## 2022-10-12 ENCOUNTER — Encounter: Payer: Self-pay | Admitting: Nurse Practitioner

## 2022-10-12 VITALS — BP 128/70 | HR 86 | Temp 97.8°F | Ht 68.0 in | Wt 152.0 lb

## 2022-10-12 DIAGNOSIS — R0609 Other forms of dyspnea: Secondary | ICD-10-CM

## 2022-10-12 DIAGNOSIS — R600 Localized edema: Secondary | ICD-10-CM

## 2022-10-12 DIAGNOSIS — R059 Cough, unspecified: Secondary | ICD-10-CM | POA: Diagnosis not present

## 2022-10-12 DIAGNOSIS — J90 Pleural effusion, not elsewhere classified: Secondary | ICD-10-CM | POA: Diagnosis not present

## 2022-10-12 LAB — CBC
HCT: 42.9 % (ref 39.0–52.0)
Hemoglobin: 13.5 g/dL (ref 13.0–17.0)
MCHC: 31.5 g/dL (ref 30.0–36.0)
MCV: 83.5 fl (ref 78.0–100.0)
Platelets: 254 10*3/uL (ref 150.0–400.0)
RBC: 5.13 Mil/uL (ref 4.22–5.81)
RDW: 17.2 % — ABNORMAL HIGH (ref 11.5–15.5)
WBC: 6.4 10*3/uL (ref 4.0–10.5)

## 2022-10-12 LAB — COMPREHENSIVE METABOLIC PANEL
ALT: 25 U/L (ref 0–53)
AST: 27 U/L (ref 0–37)
Albumin: 4.2 g/dL (ref 3.5–5.2)
Alkaline Phosphatase: 71 U/L (ref 39–117)
BUN: 14 mg/dL (ref 6–23)
CO2: 28 mEq/L (ref 19–32)
Calcium: 9.6 mg/dL (ref 8.4–10.5)
Chloride: 106 mEq/L (ref 96–112)
Creatinine, Ser: 0.9 mg/dL (ref 0.40–1.50)
GFR: 82.65 mL/min (ref >60.00)
Glucose, Bld: 99 mg/dL (ref 70–99)
Potassium: 4.1 mEq/L (ref 3.5–5.1)
Sodium: 143 mEq/L (ref 135–145)
Total Bilirubin: 1.3 mg/dL — ABNORMAL HIGH (ref 0.2–1.2)
Total Protein: 7 g/dL (ref 6.0–8.3)

## 2022-10-12 LAB — BRAIN NATRIURETIC PEPTIDE: Pro B Natriuretic peptide (BNP): 2878 pg/mL — ABNORMAL HIGH (ref 0.0–100.0)

## 2022-10-12 NOTE — Assessment & Plan Note (Signed)
Been over the past 2 weeks.  Patient has had a cough with some sputum production.  Given length of time will defer COVID testing pending chest x-ray.

## 2022-10-12 NOTE — Progress Notes (Signed)
Acute Office Visit  Subjective:     Patient ID: Walter Vargas, male    DOB: 19-Sep-1945, 77 y.o.   MRN: [id number]  Chief Complaint  Patient presents with   Shortness of Breath    With walking x 2 weeks some cough that is productive brown in color.      Patient is in today for shortness of breath with a history that is non contributory   States that it started 2 weeks ago. States that he has not been around anyone that was sick. States that he is having shortness of breath with exertoin. States that at rest he is ok at rest. States it vaires with distance. States that he can sleep 3-4 hours   Review of Systems  Constitutional:  Positive for malaise/fatigue. Negative for chills and fever.  HENT:  Negative for congestion, ear discharge, ear pain, sinus pain and sore throat.   Respiratory:  Positive for cough, sputum production and shortness of breath.   Cardiovascular:  Negative for chest pain.  Neurological:  Negative for dizziness and headaches.        Objective:    BP 128/70   Pulse 86   Temp 97.8 F (36.6 C) (Temporal)   Ht 5\' 8"  (1.727 m)   Wt 152 lb (68.9 kg)   SpO2 94%   BMI 23.11 kg/m  BP Readings from Last 3 Encounters:  10/12/22 128/70  06/21/22 (!) 160/92  06/29/21 130/70   Wt Readings from Last 3 Encounters:  10/12/22 152 lb (68.9 kg)  06/21/22 150 lb 8 oz (68.3 kg)  06/29/21 149 lb 8 oz (67.8 kg)      Physical Exam Vitals and nursing note reviewed.  Constitutional:      Appearance: Normal appearance.  Cardiovascular:     Rate and Rhythm: Normal rate. Rhythm irregular.     Heart sounds: Normal heart sounds.  Pulmonary:     Effort: Pulmonary effort is normal.     Breath sounds: Normal breath sounds.  Musculoskeletal:     Right lower leg: 2+ Pitting Edema present.     Left lower leg: 2+ Pitting Edema present.  Neurological:     Mental Status: He is alert.     Results for orders placed or performed in visit on 10/12/22  CBC  Result Value  Ref Range   WBC 6.4 4.0 - 10.5 K/uL   RBC 5.13 4.22 - 5.81 Mil/uL   Platelets 254.0 150.0 - 400.0 K/uL   Hemoglobin 13.5 13.0 - 17.0 g/dL   HCT 40.9 81.1 - 91.4 %   MCV 83.5 78.0 - 100.0 fl   MCHC 31.5 30.0 - 36.0 g/dL   RDW 78.2 (H) 95.6 - 21.3 %  Comprehensive metabolic panel  Result Value Ref Range   Sodium 143 135 - 145 mEq/L   Potassium 4.1 3.5 - 5.1 mEq/L   Chloride 106 96 - 112 mEq/L   CO2 28 19 - 32 mEq/L   Glucose, Bld 99 70 - 99 mg/dL   BUN 14 6 - 23 mg/dL   Creatinine, Ser 0.86 0.40 - 1.50 mg/dL   Total Bilirubin 1.3 (H) 0.2 - 1.2 mg/dL   Alkaline Phosphatase 71 39 - 117 U/L   AST 27 0 - 37 U/L   ALT 25 0 - 53 U/L   Total Protein 7.0 6.0 - 8.3 g/dL   Albumin 4.2 3.5 - 5.2 g/dL   GFR 57.84 >69.62 mL/min   Calcium 9.6 8.4 - 10.5 mg/dL  Brain natriuretic peptide  Result Value Ref Range   Pro B Natriuretic peptide (BNP) 2,878.0 (H) 0.0 - 100.0 pg/mL        Assessment & Plan:   Problem List Items Addressed This Visit       Other   DOE (dyspnea on exertion) - Primary    Been over the past 2 weeks.  Patient has had a cough with some sputum production.  Given length of time will defer COVID testing pending chest x-ray.      Relevant Orders   EKG 12-Lead (Completed)   DG Chest 2 View (Completed)   CBC (Completed)   Comprehensive metabolic panel (Completed)   Brain natriuretic peptide (Completed)   Lower extremity edema    Given dyspnea on exertion and lower extremity edema we will order a BNP along with chest x-ray EKG did show some LVH patient may be experiencing some heart failure      Relevant Orders   CBC (Completed)   Comprehensive metabolic panel (Completed)   Brain natriuretic peptide (Completed)    No orders of the defined types were placed in this encounter.   Return if symptoms worsen or fail to improve.  Audria Nine, NP

## 2022-10-12 NOTE — Patient Instructions (Signed)
Nice to see you today I will be in touch with the labs and xray once I have it  Follow up if you do not improve

## 2022-10-12 NOTE — Assessment & Plan Note (Signed)
Given dyspnea on exertion and lower extremity edema we will order a BNP along with chest x-ray EKG did show some LVH patient may be experiencing some heart failure

## 2022-10-13 ENCOUNTER — Telehealth: Payer: Self-pay | Admitting: Nurse Practitioner

## 2022-10-13 ENCOUNTER — Other Ambulatory Visit: Payer: Self-pay | Admitting: Nurse Practitioner

## 2022-10-13 DIAGNOSIS — R600 Localized edema: Secondary | ICD-10-CM

## 2022-10-13 DIAGNOSIS — J90 Pleural effusion, not elsewhere classified: Secondary | ICD-10-CM

## 2022-10-13 DIAGNOSIS — R0609 Other forms of dyspnea: Secondary | ICD-10-CM

## 2022-10-13 MED ORDER — FUROSEMIDE 20 MG PO TABS: 20.0000 mg | ORAL_TABLET | Freq: Every day | ORAL | 0 refills | Status: DC

## 2022-10-13 NOTE — Progress Notes (Signed)
Discussed results with patient over the phone.

## 2022-10-13 NOTE — Telephone Encounter (Signed)
Patient is on the phone with the pharmacy and they are stating they do not have any medication for him but he knows he discussed with Audria Nine about calling something in. Please advise

## 2022-10-13 NOTE — Telephone Encounter (Signed)
Pt called requesting a call regarding his Xray results and question about medication that was  called in yesterday 10/12/22 . Please advise [phone]

## 2022-10-13 NOTE — Telephone Encounter (Signed)
Contacted pt to inform that xray results have not come back yet. Pt has a question about medication prescribed yesterday. Does not know the name. Medication is not in pt chart. States it is for trouble breathing. Pt states that he will not take anything until ok'd by provider.

## 2022-10-13 NOTE — Telephone Encounter (Signed)
Matt spoke to patient. Will forward to matt if anything needs to be added.

## 2022-10-19 ENCOUNTER — Ambulatory Visit: Payer: 59 | Admitting: Nurse Practitioner

## 2022-10-19 ENCOUNTER — Encounter: Payer: Self-pay | Admitting: Nurse Practitioner

## 2022-10-19 VITALS — BP 130/70 | HR 62 | Temp 97.7°F | Ht 68.0 in | Wt 146.8 lb

## 2022-10-19 DIAGNOSIS — R0609 Other forms of dyspnea: Secondary | ICD-10-CM | POA: Diagnosis not present

## 2022-10-19 DIAGNOSIS — J90 Pleural effusion, not elsewhere classified: Secondary | ICD-10-CM | POA: Diagnosis not present

## 2022-10-19 DIAGNOSIS — R600 Localized edema: Secondary | ICD-10-CM | POA: Diagnosis not present

## 2022-10-19 DIAGNOSIS — R7989 Other specified abnormal findings of blood chemistry: Secondary | ICD-10-CM | POA: Diagnosis not present

## 2022-10-19 LAB — BASIC METABOLIC PANEL
BUN: 12 mg/dL (ref 6–23)
CO2: 30 mEq/L (ref 19–32)
Calcium: 9 mg/dL (ref 8.4–10.5)
Chloride: 104 mEq/L (ref 96–112)
Creatinine, Ser: 0.82 mg/dL (ref 0.40–1.50)
GFR: 84.99 mL/min (ref >60.00)
Glucose, Bld: 95 mg/dL (ref 70–99)
Potassium: 3.6 mEq/L (ref 3.5–5.1)
Sodium: 143 mEq/L (ref 135–145)

## 2022-10-19 LAB — BRAIN NATRIURETIC PEPTIDE: Pro B Natriuretic peptide (BNP): 2297 pg/mL — ABNORMAL HIGH (ref 0.0–100.0)

## 2022-10-19 NOTE — Assessment & Plan Note (Signed)
Improved with initiation of Lasix 20 mg daily.  Will recheck BNP today and renal function continue Lasix for now echocardiogram also ordered for suspected heart failure

## 2022-10-19 NOTE — Assessment & Plan Note (Signed)
Has improved with the initiation of Lasix 20 mg daily.  Continue

## 2022-10-19 NOTE — Patient Instructions (Addendum)
Nice to see you today I will be in touch with the labs once I have reviewed them Follow up with me in approx 8 months, sooner if you need me

## 2022-10-19 NOTE — Assessment & Plan Note (Addendum)
Noticed on x-ray last time.  Patient was started on Lasix 20 mg daily.  Tolerating medication well.  Will continue the fluid pill and recheck basic labs today.  Echocardiogram ordered

## 2022-10-19 NOTE — Progress Notes (Signed)
Established Patient Office Visit  Subjective   Patient ID: Walter Vargas, male    DOB: [birth year]  Age: 77 y.o. MRN: [id number]  Chief Complaint  Patient presents with   Follow-up    Pt states he is feeling fine.     HPI  DOE: Patient was seen by me on 10/12/2018 for dyspnea on exertion and lower extremity edema.  Patient underwent lab drawl and chest x-ray.Patient's BNP came back at 2800.  Chest x-ray reflected pulmonary edema with small bilateral pleural effusions.  Patient was placed on Lasix and here for follow-up for her renal function check and status recheck.  Patient states he has been taking the fluid pill as prescribed and tolerating medication well.  He has noticed a difference.  States that last time walking back to the exam room he was winded today has not.  States he does not swelling his legs have decreased.  States he did not take the fluid pill this morning as he is unsure of how many times he had to stop and to the restroom.  Plans on taking it when he gets in the car after office visit per his report    Review of Systems  Constitutional:  Negative for chills and fever.  Respiratory:  Negative for shortness of breath.   Cardiovascular:  Positive for leg swelling. Negative for chest pain.  Neurological:  Negative for dizziness and headaches.      Objective:     BP 130/70   Pulse 62   Temp 97.7 F (36.5 C) (Temporal)   Ht 5\' 8"  (1.727 m)   Wt 146 lb 12.8 oz (66.6 kg)   SpO2 96%   BMI 22.32 kg/m    Physical Exam Vitals and nursing note reviewed.  Constitutional:      Appearance: Normal appearance.  Cardiovascular:     Rate and Rhythm: Normal rate. Rhythm irregular.     Heart sounds: Normal heart sounds.     Comments: Regularly irregular  Pulmonary:     Effort: Pulmonary effort is normal.     Breath sounds: Normal breath sounds.  Musculoskeletal:     Right lower leg: Edema (trace) present.     Left lower leg: Edema (1+) present.  Neurological:      Mental Status: He is alert.      No results found for any visits on 10/19/22.    The 10-year ASCVD risk score (Arnett DK, et al., 2019) is: 26.7%    Assessment & Plan:   Problem List Items Addressed This Visit       Respiratory   Pleural effusion    Noticed on x-ray last time.  Patient was started on Lasix 20 mg daily.  Tolerating medication well.  Will continue the fluid pill and recheck basic labs today.  Echocardiogram ordered      Relevant Orders   Basic metabolic panel   Brain natriuretic peptide   ECHOCARDIOGRAM COMPLETE     Other   DOE (dyspnea on exertion)    Improved with initiation of Lasix 20 mg daily.  Will recheck BNP today and renal function continue Lasix for now echocardiogram also ordered for suspected heart failure      Relevant Orders   Basic metabolic panel   Brain natriuretic peptide   ECHOCARDIOGRAM COMPLETE   Lower extremity edema    Has improved with the initiation of Lasix 20 mg daily.  Continue      Elevated brain natriuretic peptide (BNP) level - Primary  Relevant Orders   Basic metabolic panel   Brain natriuretic peptide   ECHOCARDIOGRAM COMPLETE    Return in about 8 months (around 06/22/2023) for CPE and Labs.    Audria Nine, NP

## 2022-10-25 NOTE — Telephone Encounter (Signed)
Pt called in requesting a call back regarding lab results and concern about Medication [phone]

## 2022-10-26 NOTE — Telephone Encounter (Signed)
Called patient reviewed all information and repeated back to me. Pt states the Lasix is working good. Pt also stated that he has spoken to matt personally 3-4 times and would like to follow up again.

## 2022-10-26 NOTE — Telephone Encounter (Signed)
Pt call back request a call back regarding his lab results and medication concern #[phone]

## 2022-10-26 NOTE — Telephone Encounter (Signed)
I am not sure if the patient has any more questions or not. I am waiting on him to get the echocardiogram and then will be in touch with the results and if we need to do further follow up

## 2022-10-26 NOTE — Telephone Encounter (Signed)
Contacted pt. Could not hear pt over the phone. Will contact again regarding results.

## 2022-10-27 ENCOUNTER — Telehealth: Payer: Self-pay | Admitting: Nurse Practitioner

## 2022-10-27 NOTE — Telephone Encounter (Signed)
-----   Message from [facility] [place] T sent at 10/27/2022  9:33 AM EDT ----- Called patient reviewed all information and repeated back to me. Will call if any questions.  Patient stats he has not received any call about setting up echocardiogram. Do they call him or do we need to reach out to referrals to get number for him to call?

## 2022-10-27 NOTE — Telephone Encounter (Signed)
They should call him but if we can get the number for him that would be great

## 2022-10-30 NOTE — Telephone Encounter (Signed)
Contacted pt to give him the phone number for [facility] echocardiogram. Will call if any questions.

## 2022-10-30 NOTE — Telephone Encounter (Signed)
The patient can call [facility] to schedule Echo [phone]

## 2022-11-01 DIAGNOSIS — M9903 Segmental and somatic dysfunction of lumbar region: Secondary | ICD-10-CM | POA: Diagnosis not present

## 2022-11-01 DIAGNOSIS — M9901 Segmental and somatic dysfunction of cervical region: Secondary | ICD-10-CM | POA: Diagnosis not present

## 2022-11-01 DIAGNOSIS — M62830 Muscle spasm of back: Secondary | ICD-10-CM | POA: Diagnosis not present

## 2022-11-01 DIAGNOSIS — M5033 Other cervical disc degeneration, cervicothoracic region: Secondary | ICD-10-CM | POA: Diagnosis not present

## 2022-11-04 ENCOUNTER — Other Ambulatory Visit: Payer: Self-pay | Admitting: Nurse Practitioner

## 2022-11-04 DIAGNOSIS — R0609 Other forms of dyspnea: Secondary | ICD-10-CM

## 2022-11-04 DIAGNOSIS — J90 Pleural effusion, not elsewhere classified: Secondary | ICD-10-CM

## 2022-11-04 DIAGNOSIS — R600 Localized edema: Secondary | ICD-10-CM

## 2022-11-06 NOTE — Telephone Encounter (Signed)
Patient called office states that he needs refill as requested. He is taking 1 tab daily. Has had improvement with symptoms. He still has very little swelling. Will need refill soon .

## 2022-11-16 ENCOUNTER — Telehealth: Payer: Self-pay | Admitting: Nurse Practitioner

## 2022-11-16 NOTE — Telephone Encounter (Signed)
I am planning to keep him on it for a while. The side effects can be that it depletes his potassium and decrease kidney function. That is why I had him come back in office for labs. He still is holding on to a lot of fluid and I am waiting for him to get the Echo done for more information in regards to this decision

## 2022-11-16 NOTE — Telephone Encounter (Signed)
Pt called asking Toney Reil how long does he plan to have him on the meds, furosemide (LASIX) 20 MG tablet? Pt states at first the rx was for 30 tablets then when the meds got refilled, they were for 3 month supply. Pt also asked what are some of the side effects for meds? Call back # [phone]

## 2022-11-20 NOTE — Telephone Encounter (Signed)
Called patient reviewed information. He has not had echo done he thought that it didn't need to have that done for 8 months or so in our office. Advised that is not done in our office but at cardiology. If you want him to have done sooner that is ok would like done in [place].   Patient also was not sure if he had labs rechecked. Looks like he still needs to have done. Let patient know I will check with [facility] and call back with information.

## 2022-11-20 NOTE — Telephone Encounter (Signed)
The echo was ordered at the last office visit. I rechecked labs at that point too. If he would like to have them checked again to make sure the kidneys and all are doing ok, make a lab appointment for the patient  Referral pool can we check on the status of the echo please

## 2022-11-21 NOTE — Telephone Encounter (Signed)
Does not have to be done at cardiology

## 2022-11-22 DIAGNOSIS — M9903 Segmental and somatic dysfunction of lumbar region: Secondary | ICD-10-CM | POA: Diagnosis not present

## 2022-11-22 DIAGNOSIS — M62830 Muscle spasm of back: Secondary | ICD-10-CM | POA: Diagnosis not present

## 2022-11-22 DIAGNOSIS — M9901 Segmental and somatic dysfunction of cervical region: Secondary | ICD-10-CM | POA: Diagnosis not present

## 2022-11-22 DIAGNOSIS — M5033 Other cervical disc degeneration, cervicothoracic region: Secondary | ICD-10-CM | POA: Diagnosis not present

## 2022-12-13 DIAGNOSIS — M62830 Muscle spasm of back: Secondary | ICD-10-CM | POA: Diagnosis not present

## 2022-12-13 DIAGNOSIS — M9903 Segmental and somatic dysfunction of lumbar region: Secondary | ICD-10-CM | POA: Diagnosis not present

## 2022-12-13 DIAGNOSIS — M5033 Other cervical disc degeneration, cervicothoracic region: Secondary | ICD-10-CM | POA: Diagnosis not present

## 2022-12-13 DIAGNOSIS — M9901 Segmental and somatic dysfunction of cervical region: Secondary | ICD-10-CM | POA: Diagnosis not present

## 2022-12-20 ENCOUNTER — Ambulatory Visit: Payer: 59 | Attending: Nurse Practitioner

## 2022-12-25 DIAGNOSIS — I493 Ventricular premature depolarization: Secondary | ICD-10-CM | POA: Diagnosis not present

## 2022-12-25 DIAGNOSIS — R0602 Shortness of breath: Secondary | ICD-10-CM | POA: Diagnosis not present

## 2023-01-01 ENCOUNTER — Ambulatory Visit: Payer: 59 | Admitting: Nurse Practitioner

## 2023-01-01 ENCOUNTER — Telehealth: Payer: Self-pay | Admitting: Nurse Practitioner

## 2023-01-01 VITALS — BP 122/80 | HR 70 | Ht 68.0 in | Wt 142.0 lb

## 2023-01-01 DIAGNOSIS — R0609 Other forms of dyspnea: Secondary | ICD-10-CM | POA: Diagnosis not present

## 2023-01-01 DIAGNOSIS — R7989 Other specified abnormal findings of blood chemistry: Secondary | ICD-10-CM

## 2023-01-01 DIAGNOSIS — R600 Localized edema: Secondary | ICD-10-CM

## 2023-01-01 DIAGNOSIS — J90 Pleural effusion, not elsewhere classified: Secondary | ICD-10-CM | POA: Diagnosis not present

## 2023-01-01 MED ORDER — FUROSEMIDE 20 MG PO TABS
40.0000 mg | ORAL_TABLET | Freq: Every day | ORAL | Status: DC
Start: 2023-01-01 — End: 2023-02-14

## 2023-01-01 MED ORDER — METOPROLOL SUCCINATE ER 25 MG PO TB24: 25.0000 mg | ORAL_TABLET | Freq: Every day | ORAL | 0 refills | Status: DC

## 2023-01-01 NOTE — Telephone Encounter (Signed)
Called Walter Vargas to schedule Echocardiogram Complete:  Called and LVM @ 2:20 pm Called and LVM @ 3:00 pm. (No authorization required per [facility])  Please note, if pt calls: Needs scheduled at [facility]; Barbara-specialized scheduler [phone] Pt will enter at the [facility]

## 2023-01-01 NOTE — Patient Instructions (Signed)
Nice to see you today I want to see you in 7-10 days in office Sooner if you need me If you start spitting up blood let me know If the shortness of breath gets worse go to the hospital

## 2023-01-01 NOTE — Assessment & Plan Note (Signed)
Continue Lasix.  Patient defers cardiology referral at this juncture

## 2023-01-01 NOTE — Telephone Encounter (Signed)
Can we call the patient and get the echo scheduled or get him in touch with the appropriate folk please

## 2023-01-01 NOTE — Telephone Encounter (Signed)
Thank you :)

## 2023-01-01 NOTE — Assessment & Plan Note (Signed)
Still present.  Patient has not taken the fluid pill for 2 days inclusive of today.  He states that his legs are spine really well when he does take the fluid pill we are going to increase the Lasix to 40 mg daily since patient still having some shortness of breath.  Plan to check labs at next visit inclusive of BNP and BMP

## 2023-01-01 NOTE — Assessment & Plan Note (Signed)
Will increase Lasix to 40 mg daily.  Also put patient on metoprolol 25 mg nightly

## 2023-01-01 NOTE — Progress Notes (Signed)
Established Patient Office Visit  Subjective   Patient ID: BRIDGE MARZ, male    DOB: 1945-03-20  Age: 77 y.o. MRN: [id number]  Chief Complaint  Patient presents with   discuss stres test and treatment    HPI   CHF: Patient was seen by me on 10/12/2022 with dyspnea on exertion.  Labs were obtained and patient's BNP came back at 2900.  He was placed on Lasix daily and had a recheck of renal and BMP function a week later.  Kidneys were doing okay BNP had dropped approximately 600 points.  Patient was also ordered an echocardiogram but has not had that completed yet. Patient still experiencing shortness of breath on exertion.  [facility] and they did an EKG that showed normal sinus rhythm with occasional PVCs.  Patient was encouraged to follow-up with a cardiologist and primary care provider.  Did discuss with patient etiology of fluid overload likely being heart failure but unable to tell whether he has not preserved or reduced ejection fractions and had not had an echocardiogram.  Patient is still experiencing some swelling.  He has been taking the Lasix most days that does help with the swelling per his report.  Did talk about going to a cardiologist patient defers at this moment and will stick with the workup we have plan.     Review of Systems  Constitutional:  Negative for chills and fever.  Respiratory:  Positive for cough and shortness of breath.   Cardiovascular:  Positive for leg swelling. Negative for chest pain.  Neurological:  Negative for dizziness and headaches.      Objective:     BP 122/80   Pulse 70   Ht 5\' 8"  (1.727 m)   Wt 142 lb (64.4 kg)   SpO2 99%   BMI 21.59 kg/m  BP Readings from Last 3 Encounters:  01/01/23 122/80  10/19/22 130/70  10/12/22 128/70   Wt Readings from Last 3 Encounters:  01/01/23 142 lb (64.4 kg)  10/19/22 146 lb 12.8 oz (66.6 kg)  10/12/22 152 lb (68.9 kg)   SpO2 Readings from Last 3 Encounters:   01/01/23 99%  10/19/22 96%  10/12/22 94%      Physical Exam Vitals and nursing note reviewed.  Constitutional:      Appearance: Normal appearance.  Cardiovascular:     Rate and Rhythm: Normal rate and regular rhythm.     Heart sounds: Normal heart sounds.  Pulmonary:     Breath sounds: Normal breath sounds.  Musculoskeletal:     Right lower leg: Edema present.     Left lower leg: Edema present.  Neurological:     Mental Status: He is alert.      No results found for any visits on 01/01/23.    The 10-year ASCVD risk score (Arnett DK, et al., 2019) is: 24.2%    Assessment & Plan:   Problem List Items Addressed This Visit       Respiratory   Pleural effusion   Relevant Medications   furosemide (LASIX) 20 MG tablet     Other   Dyspnea on exertion    Will increase Lasix to 40 mg daily.  Also put patient on metoprolol 25 mg nightly      Relevant Medications   furosemide (LASIX) 20 MG tablet   Lower extremity edema    Still present.  Patient has not taken the fluid pill for 2 days inclusive of today.  He states that his legs are spine really well when he does take the fluid pill we are going to increase the Lasix to 40 mg daily since patient still having some shortness of breath.  Plan to check labs at next visit inclusive of BNP and BMP      Relevant Medications   furosemide (LASIX) 20 MG tablet   Elevated brain natriuretic peptide (BNP) level - Primary    Continue Lasix.  Patient defers cardiology referral at this juncture       Return in 1 week (on 01/08/2023) for BNP/BMP/ [facility] recheck .    Audria Nine, NP

## 2023-01-03 DIAGNOSIS — M9903 Segmental and somatic dysfunction of lumbar region: Secondary | ICD-10-CM | POA: Diagnosis not present

## 2023-01-03 DIAGNOSIS — M9901 Segmental and somatic dysfunction of cervical region: Secondary | ICD-10-CM | POA: Diagnosis not present

## 2023-01-03 DIAGNOSIS — M62830 Muscle spasm of back: Secondary | ICD-10-CM | POA: Diagnosis not present

## 2023-01-03 DIAGNOSIS — M5033 Other cervical disc degeneration, cervicothoracic region: Secondary | ICD-10-CM | POA: Diagnosis not present

## 2023-01-03 NOTE — Telephone Encounter (Signed)
Pt called to report he is not interested in scheduling the echocardiogram complete right now. He thought the doctor talked to him about other options besides the echo. Mr. Landaverde said he wants to talk to Dr. Toney Reil about it on Monday 01/08/23 at their appt. Pt says he "really don't want to do the echo right now".

## 2023-01-04 ENCOUNTER — Telehealth: Payer: Self-pay | Admitting: Nurse Practitioner

## 2023-01-04 NOTE — Telephone Encounter (Signed)
Contacted pt and relayed messages. Pt said okay to getting echocardiogram done. Pt would like to know if the medication metoprolol is helping the heart pump better, does it pump at a normal capacity?   Informed pt that we will recheck labs at next appointment. Pt verbalized understanding.

## 2023-01-04 NOTE — Telephone Encounter (Signed)
The metoprolol is to help the heart pump better with each pump and the heart rate  I have not checked labs or xray since the last time. We did increase the lasix and plan to recheck labs when he sees me in office.  I need him to scheduled the echocardiogram in order to tell him how his hear is doing

## 2023-01-04 NOTE — Telephone Encounter (Signed)
Mr. Carraher called asking to get some answers on 'where he stands' before coming for his appt with Dr. Toney Reil on 01/08/23. He left a voicemail today:  What is metoprolol for? How are his lungs now since taking Lasix? Overall how is my heart and lungs doing?  Patient's phone: [phone]

## 2023-01-05 NOTE — Telephone Encounter (Signed)
Pt is aware and has an appointment on Monday.

## 2023-01-05 NOTE — Telephone Encounter (Signed)
The metoprolol will help it pump better I cannot tell him how his heart is pumping until he gets the echocardiogram completed

## 2023-01-08 ENCOUNTER — Encounter: Payer: Self-pay | Admitting: Nurse Practitioner

## 2023-01-08 ENCOUNTER — Ambulatory Visit (INDEPENDENT_AMBULATORY_CARE_PROVIDER_SITE_OTHER): Payer: 59 | Admitting: Nurse Practitioner

## 2023-01-08 VITALS — BP 104/80 | HR 81 | Temp 97.5°F | Ht 68.0 in | Wt 137.8 lb

## 2023-01-08 DIAGNOSIS — R0609 Other forms of dyspnea: Secondary | ICD-10-CM | POA: Diagnosis not present

## 2023-01-08 DIAGNOSIS — R7989 Other specified abnormal findings of blood chemistry: Secondary | ICD-10-CM | POA: Diagnosis not present

## 2023-01-08 LAB — BRAIN NATRIURETIC PEPTIDE: Pro B Natriuretic peptide (BNP): >4978 pg/mL — ABNORMAL HIGH (ref 0.0–100.0)

## 2023-01-08 LAB — BASIC METABOLIC PANEL
BUN: 18 mg/dL (ref 6–23)
CO2: 31 meq/L (ref 19–32)
Calcium: 9.3 mg/dL (ref 8.4–10.5)
Chloride: 103 meq/L (ref 96–112)
Creatinine, Ser: 0.89 mg/dL (ref 0.40–1.50)
GFR: 82.79 mL/min (ref >60.00)
Glucose, Bld: 97 mg/dL (ref 70–99)
Potassium: 3.5 meq/L (ref 3.5–5.1)
Sodium: 141 meq/L (ref 135–145)

## 2023-01-08 LAB — CBC
HCT: 41.7 % (ref 39.0–52.0)
Hemoglobin: 12.9 g/dL — ABNORMAL LOW (ref 13.0–17.0)
MCHC: 30.9 g/dL (ref 30.0–36.0)
MCV: 82.3 fL (ref 78.0–100.0)
Platelets: 241 10*3/uL (ref 150.0–400.0)
RBC: 5.07 Mil/uL (ref 4.22–5.81)
RDW: 19.1 % — ABNORMAL HIGH (ref 11.5–15.5)
WBC: 6.4 10*3/uL (ref 4.0–10.5)

## 2023-01-08 NOTE — Patient Instructions (Signed)
Call  [facility] scheduler [phone]  You will [facility] into the medical mall entrance for the ultrasound. I want to see you in 1 month. We can discuss the Echo result

## 2023-01-08 NOTE — Assessment & Plan Note (Signed)
Check BMP today.  Patient is down 5 pounds we did increase his diuresis.  Continue Lasix 40 mg daily

## 2023-01-08 NOTE — Progress Notes (Signed)
   Established Patient Office Visit  Subjective   Patient ID: Walter Vargas, male    DOB: 06-15-1945  Age: 77 y.o. MRN: [id number]  Chief Complaint  Patient presents with   Follow-up    Pt states metoprolol and lasix are helping.     HPI   Heart failure: states that he did start the new medications. States that he feels like they have helped. Stats that he was able to walk from the care to the room without thinking about his breathing. States that he still has some shortness of breath.  Patient states that he is able to lie flat when he sleeps.  He has tolerated medication changes and additions well.  Patient denies any lightheadedness or dizziness.  He has still yet to schedule echocardiogram.   Review of Systems  Constitutional:  Negative for chills and fever.  Respiratory:  Positive for shortness of breath (improving).   Cardiovascular:  Positive for leg swelling. Negative for chest pain.  Neurological:  Negative for dizziness and headaches.      Objective:     BP 104/80   Pulse 81   Temp (!) 97.5 F (36.4 C) (Oral)   Ht 5\' 8"  (1.727 m)   Wt 137 lb 12.8 oz (62.5 kg)   SpO2 96%   BMI 20.95 kg/m  BP Readings from Last 3 Encounters:  01/08/23 104/80  01/01/23 122/80  10/19/22 130/70   Wt Readings from Last 3 Encounters:  01/08/23 137 lb 12.8 oz (62.5 kg)  01/01/23 142 lb (64.4 kg)  10/19/22 146 lb 12.8 oz (66.6 kg)   SpO2 Readings from Last 3 Encounters:  01/08/23 96%  01/01/23 99%  10/19/22 96%      Physical Exam Vitals and nursing note reviewed.  Constitutional:      Appearance: Normal appearance.  Cardiovascular:     Rate and Rhythm: Normal rate and regular rhythm.     Heart sounds: Normal heart sounds.  Pulmonary:     Effort: Pulmonary effort is normal.     Breath sounds: Normal breath sounds.  Musculoskeletal:     Right lower leg: Edema present.     Left lower leg: Edema present.  Neurological:     Mental Status: He is alert.      No  results found for any visits on 01/08/23.    The 10-year ASCVD risk score (Arnett DK, et al., 2019) is: 18.9%    Assessment & Plan:   Problem List Items Addressed This Visit       Other   Dyspnea on exertion - Primary    Improving with beta-blocker and diuretic.  Continue both pending labs today.  Patient was given information to call and have echocardiogram scheduled I stressed it again and cannot determine the function of his heart without this additional test.      Relevant Orders   CBC   Basic metabolic panel   Brain natriuretic peptide   Elevated brain natriuretic peptide (BNP) level    Check BMP today.  Patient is down 5 pounds we did increase his diuresis.  Continue Lasix 40 mg daily      Relevant Orders   Brain natriuretic peptide    Return in about 4 weeks (around 02/05/2023) for Breathing recheck .    Audria Nine, NP

## 2023-01-08 NOTE — Assessment & Plan Note (Signed)
Improving with beta-blocker and diuretic.  Continue both pending labs today.  Patient was given information to call and have echocardiogram scheduled I stressed it again and cannot determine the function of his heart without this additional test.

## 2023-01-11 ENCOUNTER — Other Ambulatory Visit: Payer: Self-pay | Admitting: Nurse Practitioner

## 2023-01-11 DIAGNOSIS — R0609 Other forms of dyspnea: Secondary | ICD-10-CM

## 2023-01-11 DIAGNOSIS — R7989 Other specified abnormal findings of blood chemistry: Secondary | ICD-10-CM

## 2023-01-16 ENCOUNTER — Ambulatory Visit
Admission: RE | Admit: 2023-01-16 | Discharge: 2023-01-16 | Disposition: A | Discharge status: Home / Self Care | Payer: 59 | Source: Ambulatory Visit | Attending: Nurse Practitioner | Admitting: Nurse Practitioner

## 2023-01-16 ENCOUNTER — Telehealth: Payer: Self-pay | Admitting: Nurse Practitioner

## 2023-01-16 DIAGNOSIS — J90 Pleural effusion, not elsewhere classified: Secondary | ICD-10-CM | POA: Diagnosis not present

## 2023-01-16 DIAGNOSIS — R7989 Other specified abnormal findings of blood chemistry: Secondary | ICD-10-CM | POA: Diagnosis not present

## 2023-01-16 DIAGNOSIS — R0609 Other forms of dyspnea: Secondary | ICD-10-CM | POA: Diagnosis not present

## 2023-01-16 DIAGNOSIS — I519 Heart disease, unspecified: Secondary | ICD-10-CM | POA: Insufficient documentation

## 2023-01-16 LAB — ECHOCARDIOGRAM COMPLETE
Area-P 1/2: 4.26 cm2
Calc EF: 8.2 %
Est EF: <20
MV M vel: 4.49 m/s
MV Peak grad: 80.6 mm[Hg]
Radius: 0.9 cm
S' Lateral: 5.1 cm
Single Plane A2C EF: 6 %
Single Plane A4C EF: 13.2 %

## 2023-01-16 NOTE — Progress Notes (Signed)
*  PRELIMINARY RESULTS* Echocardiogram 2D Echocardiogram has been performed.  Cristela Blue 01/16/2023, 10:33 AM

## 2023-01-16 NOTE — Telephone Encounter (Signed)
Pt called in to let Cable know he took the [place] test  today pt stated if there is anything else he need todo please feel free to call pt at  [phone]

## 2023-01-23 ENCOUNTER — Other Ambulatory Visit: Payer: Self-pay | Admitting: Nurse Practitioner

## 2023-01-24 DIAGNOSIS — M9901 Segmental and somatic dysfunction of cervical region: Secondary | ICD-10-CM | POA: Diagnosis not present

## 2023-01-24 DIAGNOSIS — M9903 Segmental and somatic dysfunction of lumbar region: Secondary | ICD-10-CM | POA: Diagnosis not present

## 2023-01-24 DIAGNOSIS — M62830 Muscle spasm of back: Secondary | ICD-10-CM | POA: Diagnosis not present

## 2023-01-24 DIAGNOSIS — M5033 Other cervical disc degeneration, cervicothoracic region: Secondary | ICD-10-CM | POA: Diagnosis not present

## 2023-02-05 ENCOUNTER — Encounter: Payer: Self-pay | Admitting: Nurse Practitioner

## 2023-02-05 ENCOUNTER — Ambulatory Visit (INDEPENDENT_AMBULATORY_CARE_PROVIDER_SITE_OTHER): Payer: 59 | Admitting: Nurse Practitioner

## 2023-02-05 VITALS — BP 130/82 | HR 75 | Temp 97.6°F | Ht 68.0 in | Wt 140.4 lb

## 2023-02-05 DIAGNOSIS — I5032 Chronic diastolic (congestive) heart failure: Secondary | ICD-10-CM | POA: Insufficient documentation

## 2023-02-05 NOTE — Assessment & Plan Note (Signed)
Patient is EF less than 20% on Lasix 40 mg daily along with metoprolol 25 mg daily patient is urgently referred to cardiology but has not made an appointment.  Encourage patient to make the appointment continue medication as prescribed

## 2023-02-05 NOTE — Progress Notes (Signed)
   Established Patient Office Visit  Subjective   Patient ID: Walter Vargas, male    DOB: 1945/07/19  Age: 77 y.o. MRN: [id number]  Chief Complaint  Patient presents with   Follow-up    Pt follow up for CHF.     HPI  CHF: he has been seen by me several times. He has been referred to cardiology and has not made an appointmetn yet. He has been contacted. States that his breathing has improved but is not base line. States that he has less swelling since the lasix medication. Patient did have an echocardiogram done on 01/16/2023.  This demonstrated an ejection fraction less than 20% with left ventricular function severely decreased.  Patient also demonstrated to have a pleural effusion in the left lateral region.  Patient is on Lasix 40 mg daily along with metoprolol 25 mg once a day    Review of Systems  Constitutional:  Negative for chills and fever.  Respiratory:  Positive for shortness of breath.   Cardiovascular:  Positive for leg swelling. Negative for chest pain.  Neurological:  Negative for headaches.  Psychiatric/Behavioral:  Negative for hallucinations and suicidal ideas.       Objective:     BP 130/82   Pulse 75   Temp 97.6 F (36.4 C) (Oral)   Ht 5\' 8"  (1.727 m)   Wt 140 lb 6.4 oz (63.7 kg)   SpO2 99%   BMI 21.35 kg/m  BP Readings from Last 3 Encounters:  02/05/23 130/82  01/08/23 104/80  01/01/23 122/80   Wt Readings from Last 3 Encounters:  02/05/23 140 lb 6.4 oz (63.7 kg)  01/08/23 137 lb 12.8 oz (62.5 kg)  01/01/23 142 lb (64.4 kg)   SpO2 Readings from Last 3 Encounters:  02/05/23 99%  01/08/23 96%  01/01/23 99%      Physical Exam Vitals and nursing note reviewed.  Constitutional:      Appearance: Normal appearance.  Cardiovascular:     Rate and Rhythm: Normal rate and regular rhythm.     Heart sounds: Normal heart sounds.  Pulmonary:     Effort: Pulmonary effort is normal.     Breath sounds: Normal breath sounds.  Musculoskeletal:      Right lower leg: Edema present.     Left lower leg: Edema present.  Neurological:     Mental Status: He is alert.      No results found for any visits on 02/05/23.    The 10-year ASCVD risk score (Arnett DK, et al., 2019) is: 26.7%    Assessment & Plan:   Problem List Items Addressed This Visit       Cardiovascular and Mediastinum   Chronic diastolic congestive heart failure (HCC) - Primary    Patient is EF less than 20% on Lasix 40 mg daily along with metoprolol 25 mg daily patient is urgently referred to cardiology but has not made an appointment.  Encourage patient to make the appointment continue medication as prescribed       Return in about 3 months (around 05/08/2023) for CHF recheck .    Audria Nine, NP

## 2023-02-05 NOTE — Patient Instructions (Addendum)
Call them and make an appointment  [facility] at [facility], [place]  [phone]

## 2023-02-06 ENCOUNTER — Ambulatory Visit: Payer: 59 | Admitting: Nurse Practitioner

## 2023-02-10 ENCOUNTER — Inpatient Hospital Stay
Admission: EM | Admit: 2023-02-10 | Discharge: 2023-02-14 | DRG: 280 | Discharge status: Other Acute Inpatient Hospital | Payer: 59 | Attending: Student in an Organized Health Care Education/Training Program | Admitting: Student in an Organized Health Care Education/Training Program

## 2023-02-10 ENCOUNTER — Emergency Department: Payer: 59

## 2023-02-10 ENCOUNTER — Other Ambulatory Visit: Payer: Self-pay

## 2023-02-10 DIAGNOSIS — I5041 Acute combined systolic (congestive) and diastolic (congestive) heart failure: Secondary | ICD-10-CM | POA: Diagnosis not present

## 2023-02-10 DIAGNOSIS — K429 Umbilical hernia without obstruction or gangrene: Secondary | ICD-10-CM | POA: Diagnosis not present

## 2023-02-10 DIAGNOSIS — R0602 Shortness of breath: Secondary | ICD-10-CM | POA: Diagnosis not present

## 2023-02-10 DIAGNOSIS — I5084 End stage heart failure: Secondary | ICD-10-CM | POA: Diagnosis not present

## 2023-02-10 DIAGNOSIS — F0390 Unspecified dementia without behavioral disturbance: Secondary | ICD-10-CM | POA: Diagnosis present

## 2023-02-10 DIAGNOSIS — T8111XA Postprocedural  cardiogenic shock, initial encounter: Secondary | ICD-10-CM | POA: Diagnosis not present

## 2023-02-10 DIAGNOSIS — U071 COVID-19: Secondary | ICD-10-CM | POA: Diagnosis not present

## 2023-02-10 DIAGNOSIS — Z79899 Other long term (current) drug therapy: Secondary | ICD-10-CM

## 2023-02-10 DIAGNOSIS — J690 Pneumonitis due to inhalation of food and vomit: Secondary | ICD-10-CM | POA: Diagnosis not present

## 2023-02-10 DIAGNOSIS — R918 Other nonspecific abnormal finding of lung field: Secondary | ICD-10-CM | POA: Diagnosis not present

## 2023-02-10 DIAGNOSIS — I110 Hypertensive heart disease with heart failure: Secondary | ICD-10-CM | POA: Diagnosis not present

## 2023-02-10 DIAGNOSIS — Z833 Family history of diabetes mellitus: Secondary | ICD-10-CM | POA: Diagnosis not present

## 2023-02-10 DIAGNOSIS — Z0389 Encounter for observation for other suspected diseases and conditions ruled out: Secondary | ICD-10-CM | POA: Diagnosis not present

## 2023-02-10 DIAGNOSIS — T8111XD Postprocedural  cardiogenic shock, subsequent encounter: Secondary | ICD-10-CM | POA: Diagnosis not present

## 2023-02-10 DIAGNOSIS — R7989 Other specified abnormal findings of blood chemistry: Secondary | ICD-10-CM | POA: Diagnosis present

## 2023-02-10 DIAGNOSIS — Z87891 Personal history of nicotine dependence: Secondary | ICD-10-CM | POA: Diagnosis not present

## 2023-02-10 DIAGNOSIS — Z95811 Presence of heart assist device: Secondary | ICD-10-CM | POA: Diagnosis not present

## 2023-02-10 DIAGNOSIS — I4719 Other supraventricular tachycardia: Secondary | ICD-10-CM | POA: Diagnosis present

## 2023-02-10 DIAGNOSIS — I2510 Atherosclerotic heart disease of native coronary artery without angina pectoris: Secondary | ICD-10-CM | POA: Diagnosis not present

## 2023-02-10 DIAGNOSIS — R570 Cardiogenic shock: Secondary | ICD-10-CM | POA: Diagnosis not present

## 2023-02-10 DIAGNOSIS — I97611 Postprocedural hemorrhage and hematoma of a circulatory system organ or structure following cardiac bypass: Secondary | ICD-10-CM | POA: Diagnosis not present

## 2023-02-10 DIAGNOSIS — E876 Hypokalemia: Secondary | ICD-10-CM | POA: Diagnosis not present

## 2023-02-10 DIAGNOSIS — I5043 Acute on chronic combined systolic (congestive) and diastolic (congestive) heart failure: Secondary | ICD-10-CM | POA: Diagnosis not present

## 2023-02-10 DIAGNOSIS — Z811 Family history of alcohol abuse and dependence: Secondary | ICD-10-CM | POA: Diagnosis not present

## 2023-02-10 DIAGNOSIS — K8020 Calculus of gallbladder without cholecystitis without obstruction: Secondary | ICD-10-CM | POA: Diagnosis not present

## 2023-02-10 DIAGNOSIS — Z7982 Long term (current) use of aspirin: Secondary | ICD-10-CM

## 2023-02-10 DIAGNOSIS — I5082 Biventricular heart failure: Secondary | ICD-10-CM | POA: Diagnosis not present

## 2023-02-10 DIAGNOSIS — R079 Chest pain, unspecified: Secondary | ICD-10-CM | POA: Diagnosis not present

## 2023-02-10 DIAGNOSIS — J9601 Acute respiratory failure with hypoxia: Secondary | ICD-10-CM | POA: Diagnosis not present

## 2023-02-10 DIAGNOSIS — I340 Nonrheumatic mitral (valve) insufficiency: Secondary | ICD-10-CM | POA: Diagnosis present

## 2023-02-10 DIAGNOSIS — R1013 Epigastric pain: Secondary | ICD-10-CM | POA: Diagnosis not present

## 2023-02-10 DIAGNOSIS — Z813 Family history of other psychoactive substance abuse and dependence: Secondary | ICD-10-CM

## 2023-02-10 DIAGNOSIS — I5023 Acute on chronic systolic (congestive) heart failure: Secondary | ICD-10-CM | POA: Diagnosis not present

## 2023-02-10 DIAGNOSIS — I5021 Acute systolic (congestive) heart failure: Secondary | ICD-10-CM | POA: Diagnosis not present

## 2023-02-10 DIAGNOSIS — R0609 Other forms of dyspnea: Secondary | ICD-10-CM | POA: Diagnosis present

## 2023-02-10 DIAGNOSIS — E871 Hypo-osmolality and hyponatremia: Secondary | ICD-10-CM | POA: Diagnosis present

## 2023-02-10 DIAGNOSIS — I214 Non-ST elevation (NSTEMI) myocardial infarction: Principal | ICD-10-CM

## 2023-02-10 DIAGNOSIS — K838 Other specified diseases of biliary tract: Secondary | ICD-10-CM | POA: Diagnosis not present

## 2023-02-10 DIAGNOSIS — J90 Pleural effusion, not elsewhere classified: Secondary | ICD-10-CM | POA: Diagnosis present

## 2023-02-10 DIAGNOSIS — E872 Acidosis, unspecified: Secondary | ICD-10-CM | POA: Diagnosis not present

## 2023-02-10 DIAGNOSIS — K828 Other specified diseases of gallbladder: Secondary | ICD-10-CM | POA: Diagnosis not present

## 2023-02-10 DIAGNOSIS — I6782 Cerebral ischemia: Secondary | ICD-10-CM | POA: Diagnosis not present

## 2023-02-10 DIAGNOSIS — R609 Edema, unspecified: Secondary | ICD-10-CM | POA: Diagnosis not present

## 2023-02-10 DIAGNOSIS — R0902 Hypoxemia: Secondary | ICD-10-CM | POA: Diagnosis not present

## 2023-02-10 DIAGNOSIS — N179 Acute kidney failure, unspecified: Secondary | ICD-10-CM | POA: Insufficient documentation

## 2023-02-10 DIAGNOSIS — Z9861 Coronary angioplasty status: Secondary | ICD-10-CM | POA: Diagnosis not present

## 2023-02-10 DIAGNOSIS — Z8249 Family history of ischemic heart disease and other diseases of the circulatory system: Secondary | ICD-10-CM

## 2023-02-10 DIAGNOSIS — I5022 Chronic systolic (congestive) heart failure: Secondary | ICD-10-CM | POA: Diagnosis not present

## 2023-02-10 DIAGNOSIS — I509 Heart failure, unspecified: Secondary | ICD-10-CM | POA: Diagnosis not present

## 2023-02-10 DIAGNOSIS — I255 Ischemic cardiomyopathy: Secondary | ICD-10-CM | POA: Diagnosis not present

## 2023-02-10 HISTORY — DX: Heart failure, unspecified: I50.9

## 2023-02-10 HISTORY — DX: Essential (primary) hypertension: I10

## 2023-02-10 HISTORY — DX: Nonrheumatic mitral (valve) insufficiency: I34.0

## 2023-02-10 HISTORY — DX: Unspecified systolic (congestive) heart failure: I50.20

## 2023-02-10 LAB — BASIC METABOLIC PANEL
Anion gap: 15 (ref 5–15)
BUN: 22 mg/dL (ref 8–23)
CO2: 19 mmol/L — ABNORMAL LOW (ref 22–32)
Calcium: 8.7 mg/dL — ABNORMAL LOW (ref 8.9–10.3)
Chloride: 102 mmol/L (ref 98–111)
Creatinine, Ser: 1.37 mg/dL — ABNORMAL HIGH (ref 0.61–1.24)
GFR, Estimated: 53 mL/min — ABNORMAL LOW (ref >60)
Glucose, Bld: 110 mg/dL — ABNORMAL HIGH (ref 70–99)
Potassium: 4.4 mmol/L (ref 3.5–5.1)
Sodium: 136 mmol/L (ref 135–145)

## 2023-02-10 LAB — LIPASE, BLOOD: Lipase: 22 U/L (ref 11–51)

## 2023-02-10 LAB — PROCALCITONIN: Procalcitonin: 0.42 ng/mL

## 2023-02-10 LAB — URINALYSIS, ROUTINE W REFLEX MICROSCOPIC
Bilirubin Urine: NEGATIVE
Glucose, UA: NEGATIVE mg/dL
Hgb urine dipstick: NEGATIVE
Ketones, ur: NEGATIVE mg/dL
Leukocytes,Ua: NEGATIVE
Nitrite: NEGATIVE
Protein, ur: NEGATIVE mg/dL
Specific Gravity, Urine: >1.046 — ABNORMAL HIGH (ref 1.005–1.030)
pH: 5 (ref 5.0–8.0)

## 2023-02-10 LAB — HEPATIC FUNCTION PANEL
ALT: 46 U/L — ABNORMAL HIGH (ref 0–44)
AST: 124 U/L — ABNORMAL HIGH (ref 15–41)
Albumin: 3.4 g/dL — ABNORMAL LOW (ref 3.5–5.0)
Alkaline Phosphatase: 58 U/L (ref 38–126)
Bilirubin, Direct: 0.8 mg/dL — ABNORMAL HIGH (ref 0.0–0.2)
Indirect Bilirubin: 1.3 mg/dL — ABNORMAL HIGH (ref 0.3–0.9)
Total Bilirubin: 2.1 mg/dL — ABNORMAL HIGH (ref <1.2)
Total Protein: 6.6 g/dL (ref 6.5–8.1)

## 2023-02-10 LAB — CBC
HCT: 41.8 % (ref 39.0–52.0)
Hemoglobin: 12.6 g/dL — ABNORMAL LOW (ref 13.0–17.0)
MCH: 26.3 pg (ref 26.0–34.0)
MCHC: 30.1 g/dL (ref 30.0–36.0)
MCV: 87.3 fL (ref 80.0–100.0)
Platelets: 237 10*3/uL (ref 150–400)
RBC: 4.79 MIL/uL (ref 4.22–5.81)
RDW: 20.4 % — ABNORMAL HIGH (ref 11.5–15.5)
WBC: 10.1 10*3/uL (ref 4.0–10.5)
nRBC: 0 % (ref 0.0–0.2)

## 2023-02-10 LAB — AMMONIA: Ammonia: 14 umol/L (ref 9–35)

## 2023-02-10 LAB — PROTIME-INR
INR: 1.4 — ABNORMAL HIGH (ref 0.8–1.2)
Prothrombin Time: 17.5 s — ABNORMAL HIGH (ref 11.4–15.2)

## 2023-02-10 LAB — ETHANOL: Alcohol, Ethyl (B): <10 mg/dL (ref <10)

## 2023-02-10 LAB — APTT: aPTT: 34 s (ref 24–36)

## 2023-02-10 LAB — HEPARIN LEVEL (UNFRACTIONATED): Heparin Unfractionated: 0.16 [IU]/mL — ABNORMAL LOW (ref 0.30–0.70)

## 2023-02-10 LAB — BRAIN NATRIURETIC PEPTIDE: B Natriuretic Peptide: >4500 pg/mL — ABNORMAL HIGH (ref 0.0–100.0)

## 2023-02-10 LAB — TROPONIN I (HIGH SENSITIVITY)
Troponin I (High Sensitivity): 13606 ng/L (ref <18)
Troponin I (High Sensitivity): 13871 ng/L (ref <18)

## 2023-02-10 MED ORDER — HEPARIN BOLUS VIA INFUSION
1900.0000 [IU] | Freq: Once | INTRAVENOUS | Status: AC
Start: 2023-02-10 — End: 2023-02-10
  Administered 2023-02-10: 1900 [IU] via INTRAVENOUS
  Filled 2023-02-10: qty 1900

## 2023-02-10 MED ORDER — ASPIRIN 81 MG PO TBEC
81.0000 mg | DELAYED_RELEASE_TABLET | Freq: Every day | ORAL | Status: DC
  Administered 2023-02-11 – 2023-02-14 (×4): 81 mg via ORAL
  Filled 2023-02-10 (×4): qty 1

## 2023-02-10 MED ORDER — FUROSEMIDE 10 MG/ML IJ SOLN
20.0000 mg | Freq: Once | INTRAMUSCULAR | Status: AC
  Administered 2023-02-10: 20 mg via INTRAVENOUS
  Filled 2023-02-10: qty 2

## 2023-02-10 MED ORDER — ONDANSETRON HCL 4 MG PO TABS: 4.0000 mg | ORAL_TABLET | Freq: Four times a day (QID) | ORAL | Status: DC | PRN

## 2023-02-10 MED ORDER — FUROSEMIDE 10 MG/ML IJ SOLN
40.0000 mg | Freq: Once | INTRAMUSCULAR | Status: AC
Start: 2023-02-10 — End: 2023-02-10
  Administered 2023-02-10: 40 mg via INTRAVENOUS
  Filled 2023-02-10: qty 4

## 2023-02-10 MED ORDER — IOHEXOL 350 MG/ML SOLN
100.0000 mL | Freq: Once | INTRAVENOUS | Status: AC | PRN
  Administered 2023-02-10: 100 mL via INTRAVENOUS

## 2023-02-10 MED ORDER — ACETAMINOPHEN 650 MG RE SUPP: 650.0000 mg | Freq: Four times a day (QID) | RECTAL | Status: DC | PRN

## 2023-02-10 MED ORDER — ASPIRIN 81 MG PO CHEW
324.0000 mg | CHEWABLE_TABLET | Freq: Once | ORAL | Status: AC
  Administered 2023-02-10: 81 mg via ORAL
  Filled 2023-02-10: qty 4

## 2023-02-10 MED ORDER — FUROSEMIDE 10 MG/ML IJ SOLN
40.0000 mg | Freq: Once | INTRAMUSCULAR | Status: DC
Start: 2023-02-10 — End: 2023-02-10

## 2023-02-10 MED ORDER — ACETAMINOPHEN 325 MG PO TABS: 650.0000 mg | ORAL_TABLET | Freq: Four times a day (QID) | ORAL | Status: DC | PRN

## 2023-02-10 MED ORDER — ONDANSETRON HCL 4 MG/2ML IJ SOLN: 4.0000 mg | Freq: Four times a day (QID) | INTRAMUSCULAR | Status: DC | PRN

## 2023-02-10 MED ORDER — HEPARIN (PORCINE) 25000 UT/250ML-% IV SOLN
1250.0000 [IU]/h | INTRAVENOUS | Status: DC
  Administered 2023-02-10: 750 [IU]/h via INTRAVENOUS
  Administered 2023-02-11: 1050 [IU]/h via INTRAVENOUS
  Administered 2023-02-12 (×2): 1250 [IU]/h via INTRAVENOUS
  Filled 2023-02-10 (×5): qty 250

## 2023-02-10 MED ORDER — SENNOSIDES-DOCUSATE SODIUM 8.6-50 MG PO TABS: 1.0000 | ORAL_TABLET | Freq: Every evening | ORAL | Status: DC | PRN

## 2023-02-10 MED ORDER — HEPARIN BOLUS VIA INFUSION
4000.0000 [IU] | Freq: Once | INTRAVENOUS | Status: AC
  Administered 2023-02-10: 4000 [IU] via INTRAVENOUS
  Filled 2023-02-10: qty 4000

## 2023-02-10 NOTE — Assessment & Plan Note (Addendum)
Versus edema with stones and sludge with elevated LFTs Per right upper quadrant ultrasound, acute cholecystitis is possible I will order a HIDA scan for further evaluation

## 2023-02-10 NOTE — ED Notes (Signed)
Pt taken to CT.

## 2023-02-10 NOTE — ED Notes (Signed)
Troponin 13,606. MD made aware

## 2023-02-10 NOTE — Assessment & Plan Note (Addendum)
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 on admission Admit to PCU Patient currently not on heart failure medication at home Cardiology has been consulted, appreciate recommendation guidance

## 2023-02-10 NOTE — Hospital Course (Addendum)
[name] is a 77 year old male with history of hypertension, heart failure reduced ejection fraction, who presents emergency department for chief concerns of chest pain, shortness of breath, and left-sided abdominal pain. Patient recently had echocardiogram 01/16/2023 showed ejection fraction less than 20% with moderate to severe mitral regurgitation.  Lab work showed BNP was elevated > 4500.  High sensitive troponin was 13,606 and on repeat was 13,871. CT abdomen pelvis with contrast: Was read as moderate gallbladder wall thickening is noted without cholelithiasis.  Ultrasound or HIDA scan recommended to evaluate acalculous cholecystitis.  Moderate bilateral pleural effusion. Patient is seen by cardiology, started on heparin drip and IV Lasix.  HIDA scan was also ordered.

## 2023-02-10 NOTE — Assessment & Plan Note (Signed)
Recheck LFTs in the a.m.

## 2023-02-10 NOTE — Final Consult Note (Signed)
Cardiology Consultation   Patient ID: MAKARI ROPER MRN: [id number]; DOB: 04/12/1945  Admit date: 02/10/2023 Date of Consult: 02/10/2023  PCP:  [name], NP   [facility] Providers Cardiologist:  None        Patient Profile:   HAIDON CHIRIBOGA is a 77 y.o. male with a hx of HFrEF who is being seen 02/10/2023 for the evaluation of acute on chronic heart failure at the request of Dr. Sedalia Muta.  History of Present Illness:   Mr. Curren reports shortness of breath and chest tightness, which had been ongoing for a couple of months. The symptoms were primarily noticed during physical activity, but recently, he was also experienced while at rest. The patient reported feeling disoriented and confused on the day of the consultation, which was a new symptom.  His son came in town for the holiday and noticed that he was not his usual self and that he was also very dyspneic.  Other family members and spoke with him earlier in the week and his mental status was reportedly normal.  He denies any fevers, chills, cough, or dysuria.  Mr. Berenguer has been working with his PCP, Mordecai Maes, NP and had an echo 01/16/23 that revealed LVEF <20% with global hypokinesis  and moderately reduced RV function.  He had moderate pleural effusions and moderate to severe MR.  The patient's shortness of breath was initially managed with Lasix, which also helped reduce leg swelling.  he reports that the dosage of Lasix was increased from 20mg  to 60mg  over time, which seemed to improve the symptoms. However, the patient expresses to stop Lasix and to avoid medications in the future if possible.  The patient denied any current alcohol consumption and reported abstinence for over 30 years.  He has not smoked in over 40 years.  The patient's physical activity was limited due to back and joint pain. Despite these limitations, the patient reported trying to maintain some level of physical activity, primarily  walking.  The patient's family history was significant for heart disease. The patient's father and brother both had heart conditions, with the brother having had a stent placed. The patient expressed concern about the possibility of needing a stent due to the brother's negative experience.  In the ED BNP was over 4500.  High-sensitivity troponin was 13,606--> 13,871.  Urinalysis was negative for infection.  EKG revealed sinus rhythm at 96 bpm with LVH and secondary repolarization abnormality.  He was given IV Lasix and IV heparin and cardiology was consulted for further evaluation and management.  Past Medical History:  Diagnosis Date   Heart failure [facility])     Past Surgical History:  Procedure Laterality Date   NO PAST SURGERIES       Home Medications:  Prior to Admission medications   Medication Sig Start Date End Date Taking? Authorizing Provider  Ascorbic Acid (VITAMIN C) 1000 MG tablet Take 1,000 mg by mouth daily.   Yes [provider]  aspirin EC 81 MG tablet Take by mouth daily. Swallow whole. 1/2 TABLET DAILY   Yes [provider]  furosemide (LASIX) 20 MG tablet Take 2 tablets (40 mg total) by mouth daily. Patient taking differently: Take 20 mg by mouth daily. 01/01/23  Yes [name], NP  metoprolol succinate (TOPROL-XL) 25 MG 24 hr tablet TAKE 1 TABLET (25 MG TOTAL) BY MOUTH DAILY. 01/23/23  Yes [name], NP  Multiple Vitamin (MULTIVITAMIN) tablet Take 1 tablet by mouth  daily. daily   Yes [provider]    Inpatient Medications: Scheduled Meds:  furosemide  20 mg Intravenous Once   Continuous Infusions:  heparin 750 Units/hr (02/10/23 1306)   PRN Meds: acetaminophen **OR** acetaminophen, ondansetron **OR** ondansetron (ZOFRAN) IV, senna-docusate  Allergies:   No Known Allergies  Social History:   Social History   Socioeconomic History   Marital status: Single    Spouse name: Not on file   Number of children: Not on file    Years of education: Not on file   Highest education level: Not on file  Occupational History   Not on file  Tobacco Use   Smoking status: Former    Current packs/day: 0.00    Average packs/day: 0.5 packs/day for 10.0 years (5.0 ttl pk-yrs)    Types: Cigarettes    Start date: 46    Quit date: 49    Years since quitting: 46.9   Smokeless tobacco: Never  Vaping Use   Vaping status: Never Used  Substance and Sexual Activity   Alcohol use: Not Currently    Comment: no alcohol since 77 years old   Drug use: Never   Sexual activity: Not Currently  Other Topics Concern   Not on file  Social History Narrative   Not on file   Social Determinants of Health   Financial Resource Strain: Not on file  Food Insecurity: Not on file  Transportation Needs: Not on file  Physical Activity: Not on file  Stress: Not on file  Social Connections: Not on file  Intimate Partner Violence: Not on file    Family History:    Family History  Problem Relation Age of Onset   Diabetes Mother        gestational diabetes   Hypertension Father    Alcohol abuse Father    Heart disease Brother    Alcohol abuse Brother    Drug abuse Brother      ROS:  Please see the history of present illness.   All other ROS reviewed and negative.     Physical Exam/Data:   Vitals:   02/10/23 1330 02/10/23 1400 02/10/23 1430 02/10/23 1500  BP: 111/76 110/76 111/79 104/64  Pulse: 84 95 (!) 145 74  Resp: 16 18 (!) 23 20  Temp:      TempSrc:      SpO2: 97% 98% 96% 95%   No intake or output data in the 24 hours ending 02/10/23 1604    02/05/2023    8:21 AM 01/08/2023    9:13 AM 01/01/2023    9:04 AM  Last 3 Weights  Weight (lbs) 140 lb 6.4 oz 137 lb 12.8 oz 142 lb  Weight (kg) 63.685 kg 62.506 kg 64.411 kg     There is no height or weight on file to calculate BMI.  General:  Well nourished, well developed, in no acute distress HEENT: normal Neck: + JVD to mandible at 45 degrees.  +HJR.  Vascular:  No carotid bruits; Distal pulses 2+ bilaterally Cardiac:  normal S1, S2; RRR; no murmur, rubs or gallops Lungs:  clear to auscultation bilaterally, no wheezing, rhonchi or rales  Abd: soft, nontender, no hepatomegaly  Ext: no edema Musculoskeletal:  No deformities, BUE and BLE strength normal and equal Skin: warm and dry  Neuro:  CNs 2-12 intact, no focal abnormalities noted Psych:  Normal affect   EKG:  The EKG was personally reviewed and demonstrates:  sinus rhythm at 96 bpm with LVH and  secondary repolarization abnormality Telemetry:  Telemetry was personally reviewed and demonstrates:  sinus rhythm.  PVCs  Relevant CV Studies:  Echo 01/16/23:  1. Left ventricular ejection fraction, by estimation, is <20%. The left  ventricle has severely decreased function. The left ventricle demonstrates  global hypokinesis. The left ventricular internal cavity size was mildly  dilated. Left ventricular  diastolic parameters are indeterminate.   2. Right ventricular systolic function is moderately reduced. The right  ventricular size is mildly enlarged. There is moderately elevated  pulmonary artery systolic pressure.   3. Left atrial size was mild to moderately dilated.   4. Right atrial size was mildly dilated.   5. Moderate pleural effusion in the left lateral region.   6. The mitral valve is normal in structure. Moderate to severe mitral  valve regurgitation. No evidence of mitral stenosis.   7. The aortic valve is normal in structure. Aortic valve regurgitation is  not visualized. Aortic valve sclerosis/calcification is present, without  any evidence of aortic stenosis.   8. The inferior vena cava is dilated in size with <50% respiratory  variability, suggesting right atrial pressure of 15 mmHg.   Laboratory Data:  High Sensitivity Troponin:   Recent Labs  Lab 02/10/23 1120 02/10/23 1225  TROPONINIHS 13,606* 13,871*     Chemistry Recent Labs  Lab 02/10/23 1120  NA 136  K 4.4   CL 102  CO2 19*  GLUCOSE 110*  BUN 22  CREATININE 1.37*  CALCIUM 8.7*  GFRNONAA 53*  ANIONGAP 15    Recent Labs  Lab 02/10/23 1322  PROT 6.6  ALBUMIN 3.4*  AST 124*  ALT 46*  ALKPHOS 58  BILITOT 2.1*   Lipids No results for input(s): "CHOL", "TRIG", "HDL", "LABVLDL", "LDLCALC", "CHOLHDL" in the last 168 hours.  Hematology Recent Labs  Lab 02/10/23 1120  WBC 10.1  RBC 4.79  HGB 12.6*  HCT 41.8  MCV 87.3  MCH 26.3  MCHC 30.1  RDW 20.4*  PLT 237   Thyroid No results for input(s): "TSH", "FREET4" in the last 168 hours.  BNP Recent Labs  Lab 02/10/23 1120  BNP >4,500.0*    DDimer No results for input(s): "DDIMER" in the last 168 hours.   Radiology/Studies:  US ABDOMEN LIMITED RUQ (LIVER/GB)  Result Date: 02/10/2023 CLINICAL DATA:  Abnormal gallbladder on CT EXAM: ULTRASOUND ABDOMEN LIMITED RIGHT UPPER QUADRANT COMPARISON:  CT 02/10/2023. FINDINGS: Gallbladder: Dilated gallbladder with stones and sludge. There is also significant wall thickening up to 7 mm with gallbladder wall edema. Common bile duct: Diameter: 3 mm Liver: Mildly complex cystic areas again seen left hepatic lobe lateral segment as seen on prior CT. Within normal limits in parenchymal echogenicity. Portal vein is patent on color Doppler imaging with normal direction of blood flow towards the liver. Other: Pleural effusion. IMPRESSION: Abnormal gallbladder with wall thickening, edema with stones and sludge. Acute cholecystitis is possible. If needed further workup with HIDA scan. No ductal dilatation. Right-sided pleural effusion. Electronically Signed   By: Karen Kays M.D.   On: 02/10/2023 15:00   CT ABDOMEN PELVIS W CONTRAST  Result Date: 02/10/2023 CLINICAL DATA:  Epigastric abdominal pain. EXAM: CT ABDOMEN AND PELVIS WITH CONTRAST TECHNIQUE: Multidetector CT imaging of the abdomen and pelvis was performed using the standard protocol following bolus administration of intravenous contrast.  RADIATION DOSE REDUCTION: This exam was performed according to the departmental dose-optimization program which includes automated exposure control, adjustment of the mA and/or kV according to patient size and/or use  of iterative reconstruction technique. CONTRAST:  OMNIPAQUE IOHEXOL 350 MG/ML SOLN COMPARISON:  None Available. FINDINGS: Lower chest: Bilateral pleural effusions are noted, right greater than left. Hepatobiliary: Moderate gallbladder wall thickening is noted without cholelithiasis. No biliary dilatation is noted. Probable 18 mm cyst seen in left hepatic lobe. Pancreas: Unremarkable. No pancreatic ductal dilatation or surrounding inflammatory changes. Spleen: Normal in size without focal abnormality. Adrenals/Urinary Tract: Adrenal glands are unremarkable. Kidneys are normal, without renal calculi, focal lesion, or hydronephrosis. Bladder is unremarkable. Stomach/Bowel: Stomach is within normal limits. Appendix appears normal. No evidence of bowel wall thickening, distention, or inflammatory changes. Vascular/Lymphatic: Aortic atherosclerosis. No enlarged abdominal or pelvic lymph nodes. Reproductive: Prostate is unremarkable. Other: Moderate size fat containing periumbilical hernia is noted. Minimal free fluid is noted in the pelvis. Musculoskeletal: Severe degenerative changes seen involving the left hip. L2 level degenerative disc disease is noted in lumbar spine. No acute osseous abnormality is noted. IMPRESSION: Moderate gallbladder wall thickening is noted without cholelithiasis. Ultrasound or HIDA scan is recommended to evaluate for acalculous cholecystitis. Moderate bilateral pleural effusions. Moderate size fat containing periumbilical hernia. Minimal free fluid is noted in the pelvis of uncertain etiology. Aortic Atherosclerosis (ICD10-I70.0). Electronically Signed   By: Lupita Raider M.D.   On: 02/10/2023 13:34   CT Head Wo Contrast  Result Date: 02/10/2023 CLINICAL DATA:   Altered mental status EXAM: CT HEAD WITHOUT CONTRAST TECHNIQUE: Contiguous axial images were obtained from the base of the skull through the vertex without intravenous contrast. RADIATION DOSE REDUCTION: This exam was performed according to the departmental dose-optimization program which includes automated exposure control, adjustment of the mA and/or kV according to patient size and/or use of iterative reconstruction technique. COMPARISON:  None Available. FINDINGS: Brain: No evidence of acute infarction, hemorrhage, hydrocephalus, extra-axial collection or mass lesion/mass effect. Patchy low-density changes within the periventricular and subcortical white matter most compatible with chronic microvascular ischemic change. Moderate diffuse cerebral volume loss. Vascular: Atherosclerotic calcifications involving the large vessels of the skull base. No unexpected hyperdense vessel. Skull: Normal. Negative for fracture or focal lesion. Sinuses/Orbits: No acute finding. Other: None. IMPRESSION: 1. No acute intracranial findings. 2. Chronic microvascular ischemic change and cerebral volume loss. Electronically Signed   By: Duanne Guess D.O.   On: 02/10/2023 13:16   CT Angio Chest PE W and/or Wo Contrast  Result Date: 02/10/2023 CLINICAL DATA:  Shortness of breath, hypoxia. EXAM: CT ANGIOGRAPHY CHEST WITH CONTRAST TECHNIQUE: Multidetector CT imaging of the chest was performed using the standard protocol during bolus administration of intravenous contrast. Multiplanar CT image reconstructions and MIPs were obtained to evaluate the vascular anatomy. RADIATION DOSE REDUCTION: This exam was performed according to the departmental dose-optimization program which includes automated exposure control, adjustment of the mA and/or kV according to patient size and/or use of iterative reconstruction technique. CONTRAST:  OMNIPAQUE IOHEXOL 350 MG/ML SOLN COMPARISON:  None Available. FINDINGS: Cardiovascular:  Satisfactory opacification of the pulmonary arteries to the segmental level. No evidence of pulmonary embolism. Normal heart size. No pericardial effusion. Mediastinum/Nodes: No enlarged mediastinal, hilar, or axillary lymph nodes. Thyroid gland, trachea, and esophagus demonstrate no significant findings. Lungs/Pleura: Moderate size right pleural effusion is noted with small left pleural effusion, but no pneumothorax is noted. Right middle lobe opacity is noted concerning for atelectasis or pneumonia. Mild left basilar opacity is noted most consistent with atelectasis or possibly infiltrate. Upper Abdomen: No acute abnormality. Musculoskeletal: No chest wall abnormality. No acute or significant osseous findings. Review  of the MIP images confirms the above findings. IMPRESSION: No definite evidence of pulmonary embolus. Moderate size right pleural effusion is noted with small left pleural effusion. Right middle lobe opacity is noted concerning for atelectasis or pneumonia. Mild left basilar opacity is noted most consistent with atelectasis or infiltrate. Aortic Atherosclerosis (ICD10-I70.0). Electronically Signed   By: Lupita Raider M.D.   On: 02/10/2023 13:14   DG Chest 2 View  Result Date: 02/10/2023 CLINICAL DATA:  Chest pain EXAM: CHEST - 2 VIEW COMPARISON:  Chest radiograph dated 10/12/2022 FINDINGS: Normal lung volumes. Bibasilar patchy opacities. Small right and moderate left pleural effusions. No pneumothorax. Similar cardiomediastinal silhouette. No acute osseous abnormality. IMPRESSION: 1. Small right and moderate left pleural effusions. 2. Bibasilar patchy opacities, likely atelectasis. Aspiration or pneumonia can be considered in the appropriate clinical setting. Electronically Signed   By: Agustin Cree M.D.   On: 02/10/2023 12:12     Assessment and Plan:   # Acute on chronic systolic and diastolic HF:  # Pleural effusion:  Newly diagnosed earlier this month.  Recent onset of shortness of  breath and chest tightness. Echocardiogram revealed ejection fraction <20%. No history of stroke or heart disease in the family. Currently on Lasix with some improvement in symptoms and edema.  BP too low to titrate GDMT at this time.  -Continue Lasix to manage fluid overload. -Plan for heart catheterization next week to assess for coronary artery disease. -Consider initiation of heart failure medications such as Entresto, pending results of catheterization and stabilization of blood pressure.  # NSTEMI:  Currently denies chest pain.  He had chest tightness prior to admission that seem more consistent with heart failure than ischemia.  However high-sensitivity troponin is over 13,000.  He will need a left and right heart cath catheterization for evaluation of NSTEMI as well as new diagnosis of heart failure.  Will plan to do this once he is more euvolemic next week.  He does not have much lower extremity edema but neck veins are still quite elevated.  Continue IV heparin and will add aspirin 81 mg daily.  Check fasting lipid panel.  # Encephalopathy:  New onset confusion noted today. No signs of stroke on CT scan and urinalysis was negative for UTI.  Will check ammonia as his LFTs are mildly abnormal.  No signs of infection in urine.  Will also check TSH.  General Health Maintenance -Discussed code status, patient agrees to CPR if necessary.   Risk Assessment/Risk Scores:     TIMI Risk Score for Unstable Angina or Non-ST Elevation MI:   The patient's TIMI risk score is  , which indicates a  % risk of all cause mortality, new or recurrent myocardial infarction or need for urgent revascularization in the next 14 days.  New York Heart Association (NYHA) Functional Class NYHA Class III        For questions or updates, please contact [facility] Please consult [email or web address] for contact info under    Signed, Chilton Si, MD  02/10/2023 4:04 PM

## 2023-02-10 NOTE — H&P (Addendum)
History and Physical   Walter Vargas XBM:[id number] DOB: Jul 31, 1945 DOA: 02/10/2023  PCP: [name], NP  Patient coming from: Home via POV  I have personally briefly reviewed patient's old medical records in [facility] EMR.  Chief Concern: Chest pain, shortness of breath  HPI: [name] is a 77 year old male with history of hypertension, heart failure reduced ejection fraction, who presents emergency department for chief concerns of chest pain, shortness of breath, and left-sided abdominal pain.  Vitals in the ED showed temperature of 98, respiration rate of 20, heart rate 83, blood pressure 119/62, SpO2 of 96% on room air.  Serum sodium is 136, potassium 4.4, chloride 102, bicarb 19, BUN of 22, serum creatinine 1.37, EGFR 53, nonfasting blood glucose 110, WBC 10.1, hemoglobin 12.6, platelets of 237.  BNP was elevated > 4500.  High sensitive troponin was 13,606 and on repeat was 13,871.  Patient was placed on heparin bolus and gtt.  CT abdomen pelvis with contrast: Was read as moderate gallbladder wall thickening is noted without cholelithiasis.  Ultrasound or HIDA scan recommended to evaluate acalculous cholecystitis.  Moderate bilateral pleural effusion.  EDP ordered right upper quadrant abdominal ultrasound.  EDP consulted [facility] cardiology service who recommends diuresis.  ED treatment: Heparin bolus and gtt., aspirin 324 mg p.o. one-time dose.  Furosemide 40 mg IV one-time dose. ------------------------------- Patient is a poor historian  At bedside, patient was able to tell me his first and last name and he knows he is in a hospital.  He was able to identify his son Walter Vargas at bedside.  He was not able to tell me his age or the current calendar year.  He reports he has been having shortness of breath for about 2 months.  He reports that shortness of breath is worse with exertion.  He denies current chest pain at this time.  He denies nausea, vomiting, abdominal  pain, pain with swallowing, swelling of his lower extremities, fever, chills, cough.  Social history: He lives at home on his own.  He is a former tobacco user quitting more than 40 years ago.  He denies EtOH and recreational drug use.  He is retired.  ROS: Constitutional: no weight change, no fever ENT/Mouth: no sore throat, no rhinorrhea Eyes: no eye pain, no vision changes Cardiovascular: no chest pain, + dyspnea,  no edema, no palpitations Respiratory: no cough, no sputum, no wheezing Gastrointestinal: no nausea, no vomiting, no diarrhea, no constipation Genitourinary: no urinary incontinence, no dysuria, no hematuria Musculoskeletal: no arthralgias, no myalgias Skin: no skin lesions, no pruritus, Neuro: + weakness, no loss of consciousness, no syncope Psych: no anxiety, no depression, no decrease appetite Heme/Lymph: no bruising, no bleeding  ED Course: Discussed with EDP, patient requiring hospitalization for chief concerns of NSTEMI, heart failure exacerbation.  Assessment/Plan  Principal Problem:   Acute exacerbation of CHF (congestive heart failure) (HCC) Active Problems:   NSTEMI (non-ST elevated myocardial infarction) (HCC)   Thickening of wall of gallbladder   Elevated LFTs   Dyspnea on exertion   Pleural effusion   Elevated brain natriuretic peptide (BNP) level   Assessment and Plan:  * Acute exacerbation of CHF (congestive heart failure) (HCC) Strict I's and O's New diagnosis: severe heart failure with reduced ejection fraction Status post furosemide 40 mg IV one-time dose per EDP Order additional furosemide 20 mg IV at 2200 hrs. on admission Admit to PCU Patient currently not on heart failure medication at home Cardiology has been consulted, appreciate  recommendation guidance  NSTEMI (non-ST elevated myocardial infarction) (HCC) Heparin per pharmacy for NSTEMI Cardiology has been consulted, appreciate recommendation Patient had on 01/16/2023 with estimated  ejection fraction of less than 20%, left ventricle demonstrates global hypokinesis.  Left ventricular diastolic parameters are indeterminate.  Left atrial was mild to moderately dilated.  Right atrial size was mildly dilated. Admit to PCU, inpatient  Elevated LFTs Recheck LFTs in the a.m.  Thickening of wall of gallbladder Versus edema with stones and sludge with elevated LFTs Per right upper quadrant ultrasound, acute cholecystitis is possible I will order a HIDA scan for further evaluation  Pleural effusion Bilateral pleural effusion Status post furosemide 40 mg IV per EDP Ordered additional furosemide 20 mg IV one-time dose of 2200 hrs., on admission Strict I's and O's  Dyspnea on exertion Presumed secondary to end-stage heart failure  Chart reviewed.   DVT prophylaxis: Heparin per pharmacy Code Status: Full code Diet: Heart healthy Family Communication: Updated son, Walter Vargas at bedside with patient's permission Disposition Plan: Pending clinical course Consults called: Cardiology Admission status: PCU, inpatient  Past Medical History:  Diagnosis Date   Heart failure [facility])    Past Surgical History:  Procedure Laterality Date   NO PAST SURGERIES     Social History:  reports that he quit smoking about 46 years ago. His smoking use included cigarettes. He started smoking about 56 years ago. He has a 5 pack-year smoking history. He has never used smokeless tobacco. He reports that he does not currently use alcohol. He reports that he does not use drugs.  No Known Allergies Family History  Problem Relation Age of Onset   Diabetes Mother        gestational diabetes   Hypertension Father    Alcohol abuse Father    Heart disease Brother    Alcohol abuse Brother    Drug abuse Brother    Family history: Family history reviewed and not pertinent.  Prior to Admission medications   Medication Sig Start Date End Date Taking? Authorizing Provider  Ascorbic Acid (VITAMIN C)  1000 MG tablet Take 1,000 mg by mouth daily.   Yes [provider]  aspirin EC 81 MG tablet Take by mouth daily. Swallow whole. 1/2 TABLET DAILY   Yes [provider]  furosemide (LASIX) 20 MG tablet Take 2 tablets (40 mg total) by mouth daily. Patient taking differently: Take 20 mg by mouth daily. 01/01/23  Yes [name], NP  metoprolol succinate (TOPROL-XL) 25 MG 24 hr tablet TAKE 1 TABLET (25 MG TOTAL) BY MOUTH DAILY. 01/23/23  Yes [name], NP  Multiple Vitamin (MULTIVITAMIN) tablet Take 1 tablet by mouth daily. daily   Yes [provider]   Physical Exam: Vitals:   02/10/23 1400 02/10/23 1430 02/10/23 1500 02/10/23 1642  BP: 110/76 111/79 104/64 107/80  Pulse: 95 (!) 145 74 80  Resp: 18 (!) 23 20 18   Temp:      TempSrc:      SpO2: 98% 96% 95% 100%   Constitutional: appears age-appropriate, frail, cachectic appearing, NAD, calm Eyes: PERRL, lids and conjunctivae normal ENMT: Mucous membranes are moist. Posterior pharynx clear of any exudate or lesions. Age-appropriate dentition. Hearing appropriate Neck: normal, supple, no masses, no thyromegaly Respiratory: clear to auscultation bilaterally, no wheezing, no crackles. Normal respiratory effort. No accessory muscle use.  Cardiovascular: Regular rate and rhythm, no murmurs / rubs / gallops.  Bilateral lower extremity 3+ pitting edema. 2+ pedal pulses. No carotid bruits.  Abdomen: no tenderness, no masses palpated, no hepatosplenomegaly. Bowel sounds positive.  Umbilical hernia, reducible. Musculoskeletal: no clubbing / cyanosis. No joint deformity upper and lower extremities. Good ROM, no contractures, no atrophy. Normal muscle tone.  Skin: no rashes, lesions, ulcers. No induration Neurologic: Sensation intact. Strength 5/5 in all 4.  Psychiatric: Lacks judgment and insight. Alert and oriented x 3. Normal mood.   EKG: independently reviewed, showing sinus rhythm with rate of 96, QTc 485, ST  depression in V5 and V6 noted.  Chest x-ray on Admission: I personally reviewed and I agree with radiologist reading as below.  US ABDOMEN LIMITED RUQ (LIVER/GB)  Result Date: 02/10/2023 CLINICAL DATA:  Abnormal gallbladder on CT EXAM: ULTRASOUND ABDOMEN LIMITED RIGHT UPPER QUADRANT COMPARISON:  CT 02/10/2023. FINDINGS: Gallbladder: Dilated gallbladder with stones and sludge. There is also significant wall thickening up to 7 mm with gallbladder wall edema. Common bile duct: Diameter: 3 mm Liver: Mildly complex cystic areas again seen left hepatic lobe lateral segment as seen on prior CT. Within normal limits in parenchymal echogenicity. Portal vein is patent on color Doppler imaging with normal direction of blood flow towards the liver. Other: Pleural effusion. IMPRESSION: Abnormal gallbladder with wall thickening, edema with stones and sludge. Acute cholecystitis is possible. If needed further workup with HIDA scan. No ductal dilatation. Right-sided pleural effusion. Electronically Signed   By: Karen Kays M.D.   On: 02/10/2023 15:00   CT ABDOMEN PELVIS W CONTRAST  Result Date: 02/10/2023 CLINICAL DATA:  Epigastric abdominal pain. EXAM: CT ABDOMEN AND PELVIS WITH CONTRAST TECHNIQUE: Multidetector CT imaging of the abdomen and pelvis was performed using the standard protocol following bolus administration of intravenous contrast. RADIATION DOSE REDUCTION: This exam was performed according to the departmental dose-optimization program which includes automated exposure control, adjustment of the mA and/or kV according to patient size and/or use of iterative reconstruction technique. CONTRAST:  OMNIPAQUE IOHEXOL 350 MG/ML SOLN COMPARISON:  None Available. FINDINGS: Lower chest: Bilateral pleural effusions are noted, right greater than left. Hepatobiliary: Moderate gallbladder wall thickening is noted without cholelithiasis. No biliary dilatation is noted. Probable 18 mm cyst seen in left hepatic  lobe. Pancreas: Unremarkable. No pancreatic ductal dilatation or surrounding inflammatory changes. Spleen: Normal in size without focal abnormality. Adrenals/Urinary Tract: Adrenal glands are unremarkable. Kidneys are normal, without renal calculi, focal lesion, or hydronephrosis. Bladder is unremarkable. Stomach/Bowel: Stomach is within normal limits. Appendix appears normal. No evidence of bowel wall thickening, distention, or inflammatory changes. Vascular/Lymphatic: Aortic atherosclerosis. No enlarged abdominal or pelvic lymph nodes. Reproductive: Prostate is unremarkable. Other: Moderate size fat containing periumbilical hernia is noted. Minimal free fluid is noted in the pelvis. Musculoskeletal: Severe degenerative changes seen involving the left hip. L2 level degenerative disc disease is noted in lumbar spine. No acute osseous abnormality is noted. IMPRESSION: Moderate gallbladder wall thickening is noted without cholelithiasis. Ultrasound or HIDA scan is recommended to evaluate for acalculous cholecystitis. Moderate bilateral pleural effusions. Moderate size fat containing periumbilical hernia. Minimal free fluid is noted in the pelvis of uncertain etiology. Aortic Atherosclerosis (ICD10-I70.0). Electronically Signed   By: Lupita Raider M.D.   On: 02/10/2023 13:34   CT Head Wo Contrast  Result Date: 02/10/2023 CLINICAL DATA:  Altered mental status EXAM: CT HEAD WITHOUT CONTRAST TECHNIQUE: Contiguous axial images were obtained from the base of the skull through the vertex without intravenous contrast. RADIATION DOSE REDUCTION: This exam was performed according to the departmental dose-optimization program which includes automated exposure control, adjustment of  the mA and/or kV according to patient size and/or use of iterative reconstruction technique. COMPARISON:  None Available. FINDINGS: Brain: No evidence of acute infarction, hemorrhage, hydrocephalus, extra-axial collection or mass lesion/mass  effect. Patchy low-density changes within the periventricular and subcortical white matter most compatible with chronic microvascular ischemic change. Moderate diffuse cerebral volume loss. Vascular: Atherosclerotic calcifications involving the large vessels of the skull base. No unexpected hyperdense vessel. Skull: Normal. Negative for fracture or focal lesion. Sinuses/Orbits: No acute finding. Other: None. IMPRESSION: 1. No acute intracranial findings. 2. Chronic microvascular ischemic change and cerebral volume loss. Electronically Signed   By: Duanne Guess D.O.   On: 02/10/2023 13:16   CT Angio Chest PE W and/or Wo Contrast  Result Date: 02/10/2023 CLINICAL DATA:  Shortness of breath, hypoxia. EXAM: CT ANGIOGRAPHY CHEST WITH CONTRAST TECHNIQUE: Multidetector CT imaging of the chest was performed using the standard protocol during bolus administration of intravenous contrast. Multiplanar CT image reconstructions and MIPs were obtained to evaluate the vascular anatomy. RADIATION DOSE REDUCTION: This exam was performed according to the departmental dose-optimization program which includes automated exposure control, adjustment of the mA and/or kV according to patient size and/or use of iterative reconstruction technique. CONTRAST:  OMNIPAQUE IOHEXOL 350 MG/ML SOLN COMPARISON:  None Available. FINDINGS: Cardiovascular: Satisfactory opacification of the pulmonary arteries to the segmental level. No evidence of pulmonary embolism. Normal heart size. No pericardial effusion. Mediastinum/Nodes: No enlarged mediastinal, hilar, or axillary lymph nodes. Thyroid gland, trachea, and esophagus demonstrate no significant findings. Lungs/Pleura: Moderate size right pleural effusion is noted with small left pleural effusion, but no pneumothorax is noted. Right middle lobe opacity is noted concerning for atelectasis or pneumonia. Mild left basilar opacity is noted most consistent with atelectasis or possibly  infiltrate. Upper Abdomen: No acute abnormality. Musculoskeletal: No chest wall abnormality. No acute or significant osseous findings. Review of the MIP images confirms the above findings. IMPRESSION: No definite evidence of pulmonary embolus. Moderate size right pleural effusion is noted with small left pleural effusion. Right middle lobe opacity is noted concerning for atelectasis or pneumonia. Mild left basilar opacity is noted most consistent with atelectasis or infiltrate. Aortic Atherosclerosis (ICD10-I70.0). Electronically Signed   By: Lupita Raider M.D.   On: 02/10/2023 13:14   DG Chest 2 View  Result Date: 02/10/2023 CLINICAL DATA:  Chest pain EXAM: CHEST - 2 VIEW COMPARISON:  Chest radiograph dated 10/12/2022 FINDINGS: Normal lung volumes. Bibasilar patchy opacities. Small right and moderate left pleural effusions. No pneumothorax. Similar cardiomediastinal silhouette. No acute osseous abnormality. IMPRESSION: 1. Small right and moderate left pleural effusions. 2. Bibasilar patchy opacities, likely atelectasis. Aspiration or pneumonia can be considered in the appropriate clinical setting. Electronically Signed   By: Agustin Cree M.D.   On: 02/10/2023 12:12    Labs on Admission: I have personally reviewed following labs  CBC: Recent Labs  Lab 02/10/23 1120  WBC 10.1  HGB 12.6*  HCT 41.8  MCV 87.3  PLT 237   Basic Metabolic Panel: Recent Labs  Lab 02/10/23 1120  NA 136  K 4.4  CL 102  CO2 19*  GLUCOSE 110*  BUN 22  CREATININE 1.37*  CALCIUM 8.7*   GFR: Estimated Creatinine Clearance: 40.7 mL/min (A) (by C-G formula based on SCr of 1.37 mg/dL (H)).  Liver Function Tests: Recent Labs  Lab 02/10/23 1322  AST 124*  ALT 46*  ALKPHOS 58  BILITOT 2.1*  PROT 6.6  ALBUMIN 3.4*   Recent Labs  Lab  02/10/23 1322  LIPASE 22   Coagulation Profile: Recent Labs  Lab 02/10/23 1225  INR 1.4*   BNP (last 3 results) Recent Labs    10/12/22 1030 10/19/22 0852  01/08/23 0957  PROBNP 2,878.0* 2,297.0* >4978.0*   This document was prepared using Dragon Voice Recognition software and may include unintentional dictation errors.  Dr. Sedalia Muta [facility]  If 7PM-7AM, please contact overnight-coverage provider If 7AM-7PM, please contact day attending provider [email or web address]  02/10/2023, 6:01 PM

## 2023-02-10 NOTE — ED Provider Notes (Signed)
[facility] Provider Note    Event Date/Time   First MD Initiated Contact with Patient 02/10/23 1213     (approximate)   History   Shortness of Breath and Chest Pain   HPI Walter Vargas is a 77 y.o. 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  Patient states he has had intermittent shortness of breath over the past couple weeks associated with walking up stairs and hills.  Describes vague chest pain but unable to locate it.  Also intermittently stated having some abdominal pain but is not present at this time.  Denies fever, chills, cough, congestion, diarrhea, constipation, dysuria.  Does note leg swelling.  Family at bedside notes that patient has been more confused over the past couple of days.  Unsure of any recent falls.  Chart review: Reviewed most recent cardiology note as well as echo from 01/16/2023 with profound systolic heart failure with EF less than 20%.     Physical Exam   Triage Vital Signs: ED Triage Vitals  Encounter Vitals Group     BP 02/10/23 1120 119/62     Systolic BP Percentile --      Diastolic BP Percentile --      Pulse Rate 02/10/23 1118 83     Resp 02/10/23 1118 20     Temp 02/10/23 1118 98 F (36.7 C)     Temp Source 02/10/23 1118 Oral     SpO2 02/10/23 1120 96 %     Weight --      Height --      Head Circumference --      Peak Flow --      Pain Score 02/10/23 1118 4     Pain Loc --      Pain Education --      Exclude from Growth Chart --     Most recent vital signs: Vitals:   02/10/23 1120 02/10/23 1220  BP:  114/75  Pulse:  84  Resp:  15  Temp:    SpO2: 96% 98%   Physical Exam: I have reviewed the vital signs and nursing notes. General: Awake, alert, no acute distress.  Nontoxic appearing. Head:  Atraumatic, normocephalic.   ENT:  EOM intact, PERRL. Oral mucosa is pink and moist with no lesions. Neck: Neck is supple with full range of  motion, No meningeal signs. Cardiovascular:  RRR, No murmurs. Peripheral pulses palpable and equal bilaterally. Respiratory:  Symmetrical chest wall expansion.  No rhonchi, rales, or wheezes.  Good air movement throughout.  No use of accessory muscles.   Musculoskeletal:  No cyanosis.  1+ pitting edema to right lower extremity and 2+ pitting edema to left lower extremity.  Moving extremities with full ROM Abdomen:  Soft, nontender, nondistended. Neuro:  GCS 14.  Moving all extremities.  Oriented to name and place but not to year. Psych:  Calm, appropriate.   Skin:  Warm, dry, no rash.    ED Results / Procedures / Treatments   Labs (all labs ordered are listed, but only abnormal results are displayed) Labs Reviewed  BASIC METABOLIC PANEL - Abnormal; Notable for the following components:      Result Value   CO2 19 (*)    Glucose, Bld 110 (*)    Creatinine, Ser 1.37 (*)    Calcium 8.7 (*)    GFR, Estimated 53 (*)    All other components within normal limits  CBC - Abnormal; Notable for the following components:   Hemoglobin 12.6 (*)    RDW 20.4 (*)    All other components within normal limits  BRAIN NATRIURETIC PEPTIDE - Abnormal; Notable for the following components:   B Natriuretic Peptide >4,500.0 (*)    All other components within normal limits  PROTIME-INR - Abnormal; Notable for the following components:   Prothrombin Time 17.5 (*)    INR 1.4 (*)    All other components within normal limits  HEPATIC FUNCTION PANEL - Abnormal; Notable for the following components:   Albumin 3.4 (*)    AST 124 (*)    ALT 46 (*)    Total Bilirubin 2.1 (*)    Bilirubin, Direct 0.8 (*)    Indirect Bilirubin 1.3 (*)    All other components within normal limits  TROPONIN I (HIGH SENSITIVITY) - Abnormal; Notable for the following components:   Troponin I (High Sensitivity) 13,606 (*)    All other components within normal limits  TROPONIN I (HIGH SENSITIVITY) - Abnormal; Notable for the  following components:   Troponin I (High Sensitivity) 13,871 (*)    All other components within normal limits  APTT  LIPASE, BLOOD  URINALYSIS, ROUTINE W REFLEX MICROSCOPIC  HEPARIN LEVEL (UNFRACTIONATED)  PROCALCITONIN  ETHANOL     EKG My EKG interpretation: Rate of 96, right axis deviation.  Left bundle branch block.  No acute ST elevations or depressions.  EKG overall comparable with most recent on 12/25/2022   RADIOLOGY Independently interpreted CT imaging with no acute findings on CT head.  CT chest with evidence of pulmonary edema and pleural effusions consistent with his heart failure exacerbation.  CT abdomen/pelvis with distended gallbladder but no obvious cholecystitis.  Will follow this up with right upper quadrant ultrasound   PROCEDURES:  Critical Care performed: Yes, see critical care procedure note(s)  .Critical Care  Performed by: Janith Lima, MD Authorized by: Janith Lima, MD   Critical care provider statement:    Critical care time (minutes):  30   Critical care was necessary to treat or prevent imminent or life-threatening deterioration of the following conditions:  Cardiac failure and circulatory failure   Critical care was time spent personally by me on the following activities:  Development of treatment plan with patient or surrogate, discussions with consultants, evaluation of patient's response to treatment, examination of patient, ordering and review of laboratory studies, ordering and review of radiographic studies, ordering and performing treatments and interventions, pulse oximetry, re-evaluation of patient's condition and review of old charts   I assumed direction of critical care for this patient from another provider in my specialty: no     Care discussed with: admitting provider      MEDICATIONS ORDERED IN ED: Medications  heparin bolus via infusion 4,000 Units (4,000 Units Intravenous Bolus from Bag 02/10/23 1306)    Followed by  heparin  ADULT infusion 100 units/mL (25000 units/296mL) (750 Units/hr Intravenous New Bag/Given 02/10/23 1306)  aspirin chewable tablet 324 mg (81 mg Oral Given 02/10/23 1302)  iohexol (OMNIPAQUE) 350 MG/ML injection 100 mL (100 mLs Intravenous Contrast Given 02/10/23 1243)  furosemide (LASIX) injection 40 mg (40 mg Intravenous Given 02/10/23 1412)     IMPRESSION / MDM / ASSESSMENT AND PLAN / ED COURSE  I reviewed the triage vital signs and the nursing notes.  Differential diagnosis includes, but is not limited to, pneumonia, CHF exacerbation, ACS, lower suspicion PE  Patient's presentation is most consistent with acute presentation with potential threat to life or bodily function.  Patient is a 77 year old male presenting today for vague shortness of breath and chest pain symptoms.  Recent echo with profound systolic dysfunction with EF less than 20%.  Patient was brought back into the room after his troponin in the waiting room came back at 13,000.  BMP also found to be greater than 4500.  He does have pitting edema to bilateral lower extremities which appears worse on the left side.  Given slight confusion here in the room, will evaluate for infectious sources as well as CT of head and chest/abdomen/pelvis.  Patient started on heparin at this time.  CT head with no acute pathology.  CTA chest with no evidence of PE but does show pleural effusions indicative of CHF exacerbation potentially either caused by heart attack or with resultant type II demand.  Spoke with the cardiologist on the phone who agrees with heparin, aspirin, and diuresis.  No further need for immediate cardiac cath discussing EKG.  CT abdomen/pelvis also makes note of possible acalculous cholecystitis.  Patient's bilirubin is more indirect than direct and have lower suspicion for this but will get right upper quadrant ultrasound.  Patient mid to hospitalist for further care.  The patient is on the cardiac  monitor to evaluate for evidence of arrhythmia and/or significant heart rate changes. Clinical Course as of 02/10/23 1420  Sat Feb 10, 2023  1216 Troponin I (High Sensitivity)(!!): 13,606 [DW]  1303 Cardiology aware of patient.  Agree with heparin, aspirin, and diuresis at this time [DW]    Clinical Course User Index [DW] Janith Lima, MD     FINAL CLINICAL IMPRESSION(S) / ED DIAGNOSES   Final diagnoses:  NSTEMI (non-ST elevated myocardial infarction) (HCC)  Acute on chronic systolic congestive heart failure (HCC)  AKI (acute kidney injury) (HCC)     Rx / DC Orders   ED Discharge Orders     None        Note:  This document was prepared using Dragon voice recognition software and may include unintentional dictation errors.   Janith Lima, MD 02/10/23 1430

## 2023-02-10 NOTE — Assessment & Plan Note (Addendum)
Heparin per pharmacy for NSTEMI Cardiology has been consulted, appreciate recommendation Patient had on 01/16/2023 with estimated ejection fraction of less than 20%, left ventricle demonstrates global hypokinesis.  Left ventricular diastolic parameters are indeterminate.  Left atrial was mild to moderately dilated.  Right atrial size was mildly dilated. Admit to PCU, inpatient

## 2023-02-10 NOTE — Consult Note (Signed)
PHARMACY - ANTICOAGULATION CONSULT NOTE  Pharmacy Consult for heparin  Indication: chest pain/ACS  No Known Allergies  Patient Measurements:   Heparin Dosing Weight:   Vital Signs: Temp: 98 F (36.7 C) (11/30 1118) Temp Source: Oral (11/30 1118) BP: 114/75 (11/30 1220) Pulse Rate: 84 (11/30 1220)  Labs: Recent Labs    02/10/23 1120  HGB 12.6*  HCT 41.8  PLT 237  CREATININE 1.37*  TROPONINIHS 13,606*    Estimated Creatinine Clearance: 40.7 mL/min (A) (by C-G formula based on SCr of 1.37 mg/dL (H)).   Medical History: History reviewed. No pertinent past medical history.  Medications:  Not on anticoagulation PTA  Assessment: Patient is a 77 y.o. male who presented to the ED on 02/10/2023 with SOB and chest pain. PMH includes HFrEF with EF < 20%. On admission BNP > 4,500 and troponins 13,606.   Goal of Therapy:  Heparin level 0.3-0.7 units/ml Monitor platelets by anticoagulation protocol: Yes   Plan:  - Bolus 4000 units IV heparin  - Initiate heparin drip at 750 units/hr  - 8 hour heparin level, daily heparin level and CBC    Karlton Lemon, PharmD Candidate 02/10/2023,12:40 PM

## 2023-02-10 NOTE — Assessment & Plan Note (Signed)
Presumed secondary to end-stage heart failure

## 2023-02-10 NOTE — Consult Note (Signed)
PHARMACY - ANTICOAGULATION CONSULT NOTE  Pharmacy Consult for heparin  Indication: chest pain/ACS  No Known Allergies  Patient Measurements:   Heparin Dosing Weight: 63.7 kg  Vital Signs: Temp: 98 F (36.7 C) (11/30 1642) Temp Source: Oral (11/30 1642) BP: 107/80 (11/30 1642) Pulse Rate: 80 (11/30 1642)  Labs: Recent Labs    02/10/23 1120 02/10/23 1225  HGB 12.6*  --   HCT 41.8  --   PLT 237  --   APTT  --  34  LABPROT  --  17.5*  INR  --  1.4*  CREATININE 1.37*  --   TROPONINIHS 13,606* 86,578*    Estimated Creatinine Clearance: 40.7 mL/min (A) (by C-G formula based on SCr of 1.37 mg/dL (H)).   Medical History: Past Medical History:  Diagnosis Date   Heart failure (HCC)     Medications:  Not on anticoagulation PTA  Assessment: Patient is a 77 y.o. male who presented to the ED on 02/10/2023 with SOB and chest pain. PMH includes HFrEF with EF < 20%. On admission BNP > 4,500 and troponins 13,606.   Date/Time  HL Rate  Comment 11/30 2050 0.16 750 units/hr Subtherapeutic   Goal of Therapy:  Heparin level 0.3-0.7 units/ml Monitor platelets by anticoagulation protocol: Yes   Plan:  - Heparin level subtherapeutic  - Bolus 1900 units IV heparin  - Initiate heparin drip at 950 units/hr  - 8 hour heparin level, daily heparin level and CBC    Walter Vargas, PharmD Clinical Pharmacist 02/10/2023,9:03 PM

## 2023-02-10 NOTE — ED Triage Notes (Addendum)
Pt to ED via POV from home. Son reports he is visiting from out of town and was suppose to meet pt for breakfast but he wasn't there which is unusual. Son reports went to pt's apartment and saw him walking up the stairs and seems more confused. Pt reports left sided abd pain, centralized CP and SOB. Pt with labored breathing in triage. Pt is A&Ox4 to orientation questions. Pt is also very restless and fidgety in wheelchair.   Son states pt recently seen by cardiology and placed on new medications for fluid.

## 2023-02-10 NOTE — Assessment & Plan Note (Addendum)
Bilateral pleural effusion Status post furosemide 40 mg IV per EDP Ordered additional furosemide 20 mg IV one-time dose of 2200 hrs., on admission Strict I's and O's

## 2023-02-11 ENCOUNTER — Encounter: Payer: Self-pay | Admitting: Internal Medicine

## 2023-02-11 DIAGNOSIS — R7989 Other specified abnormal findings of blood chemistry: Secondary | ICD-10-CM | POA: Diagnosis not present

## 2023-02-11 DIAGNOSIS — I214 Non-ST elevation (NSTEMI) myocardial infarction: Secondary | ICD-10-CM | POA: Diagnosis not present

## 2023-02-11 DIAGNOSIS — I5023 Acute on chronic systolic (congestive) heart failure: Secondary | ICD-10-CM | POA: Diagnosis not present

## 2023-02-11 LAB — CBC
HCT: 35.3 % — ABNORMAL LOW (ref 39.0–52.0)
Hemoglobin: 11.6 g/dL — ABNORMAL LOW (ref 13.0–17.0)
MCH: 26.5 pg (ref 26.0–34.0)
MCHC: 32.9 g/dL (ref 30.0–36.0)
MCV: 80.8 fL (ref 80.0–100.0)
Platelets: 176 10*3/uL (ref 150–400)
RBC: 4.37 MIL/uL (ref 4.22–5.81)
RDW: 20 % — ABNORMAL HIGH (ref 11.5–15.5)
WBC: 6.2 10*3/uL (ref 4.0–10.5)
nRBC: 0 % (ref 0.0–0.2)

## 2023-02-11 LAB — LIPID PANEL
Cholesterol: 90 mg/dL (ref 0–200)
HDL: 38 mg/dL — ABNORMAL LOW (ref >40)
LDL Cholesterol: 43 mg/dL (ref 0–99)
Total CHOL/HDL Ratio: 2.4 {ratio}
Triglycerides: 45 mg/dL (ref <150)
VLDL: 9 mg/dL (ref 0–40)

## 2023-02-11 LAB — BASIC METABOLIC PANEL
Anion gap: 10 (ref 5–15)
BUN: 30 mg/dL — ABNORMAL HIGH (ref 8–23)
CO2: 23 mmol/L (ref 22–32)
Calcium: 8.3 mg/dL — ABNORMAL LOW (ref 8.9–10.3)
Chloride: 101 mmol/L (ref 98–111)
Creatinine, Ser: 1.2 mg/dL (ref 0.61–1.24)
GFR, Estimated: >60 mL/min (ref >60)
Glucose, Bld: 74 mg/dL (ref 70–99)
Potassium: 3.7 mmol/L (ref 3.5–5.1)
Sodium: 134 mmol/L — ABNORMAL LOW (ref 135–145)

## 2023-02-11 LAB — HEPARIN LEVEL (UNFRACTIONATED)
Heparin Unfractionated: 0.12 [IU]/mL — ABNORMAL LOW (ref 0.30–0.70)
Heparin Unfractionated: 0.17 [IU]/mL — ABNORMAL LOW (ref 0.30–0.70)

## 2023-02-11 LAB — MAGNESIUM: Magnesium: 1.9 mg/dL (ref 1.7–2.4)

## 2023-02-11 LAB — HEPATIC FUNCTION PANEL
ALT: 106 U/L — ABNORMAL HIGH (ref 0–44)
AST: 193 U/L — ABNORMAL HIGH (ref 15–41)
Albumin: 3.2 g/dL — ABNORMAL LOW (ref 3.5–5.0)
Alkaline Phosphatase: 52 U/L (ref 38–126)
Bilirubin, Direct: 0.6 mg/dL — ABNORMAL HIGH (ref 0.0–0.2)
Indirect Bilirubin: 0.8 mg/dL (ref 0.3–0.9)
Total Bilirubin: 1.4 mg/dL — ABNORMAL HIGH (ref <1.2)
Total Protein: 5.8 g/dL — ABNORMAL LOW (ref 6.5–8.1)

## 2023-02-11 LAB — TSH: TSH: 4.481 u[IU]/mL (ref 0.350–4.500)

## 2023-02-11 MED ORDER — FUROSEMIDE 10 MG/ML IJ SOLN
40.0000 mg | Freq: Two times a day (BID) | INTRAMUSCULAR | Status: DC
  Administered 2023-02-11: 40 mg via INTRAVENOUS
  Filled 2023-02-11: qty 4

## 2023-02-11 MED ORDER — POTASSIUM CHLORIDE CRYS ER 20 MEQ PO TBCR
20.0000 meq | EXTENDED_RELEASE_TABLET | Freq: Two times a day (BID) | ORAL | Status: DC
  Administered 2023-02-11 (×2): 20 meq via ORAL
  Filled 2023-02-11 (×2): qty 1

## 2023-02-11 MED ORDER — FUROSEMIDE 10 MG/ML IJ SOLN
60.0000 mg | Freq: Two times a day (BID) | INTRAMUSCULAR | Status: DC
  Administered 2023-02-11 – 2023-02-13 (×4): 60 mg via INTRAVENOUS
  Filled 2023-02-11 (×4): qty 6

## 2023-02-11 MED ORDER — HEPARIN BOLUS VIA INFUSION
1900.0000 [IU] | Freq: Once | INTRAVENOUS | Status: AC
  Administered 2023-02-11: 1900 [IU] via INTRAVENOUS
  Filled 2023-02-11: qty 1900

## 2023-02-11 NOTE — Progress Notes (Signed)
Progress Note  Patient Name: Walter Vargas Date of Encounter: 02/11/2023  Primary Cardiologist: New - consult by Dr. Duke Salvia, MD  Subjective   More lucid today. Denies chest pain or dyspnea. Reports "I have stuff to take care of at home and I want to go home." Documented UOP 124 mL for the admission. Had 3 episodes of narrow complex tachycardia last evening consistent with atrial tachycardia into the 120s bpm lasting up to 5 minutes in duration.   Inpatient Medications    Scheduled Meds:  aspirin EC  81 mg Oral Daily   Continuous Infusions:  heparin 1,050 Units/hr (02/11/23 0629)   PRN Meds: acetaminophen **OR** acetaminophen, ondansetron **OR** ondansetron (ZOFRAN) IV, senna-docusate   Vital Signs    Vitals:   02/10/23 2100 02/10/23 2332 02/11/23 0414 02/11/23 0815  BP:  103/65 118/74 113/72  Pulse:  (!) 59 60 (!) 53  Resp:  20 16 15   Temp:  98.1 F (36.7 C) 98.4 F (36.9 C) 97.8 F (36.6 C)  TempSrc:  Oral    SpO2:  98% 98% 98%  Weight: 63.7 kg     Height: 5' 7.99" (1.727 m)       Intake/Output Summary (Last 24 hours) at 02/11/2023 0825 Last data filed at 02/11/2023 0200 Gross per 24 hour  Intake 75.58 ml  Output 200 ml  Net -124.42 ml   Filed Weights   02/10/23 2100  Weight: 63.7 kg    Telemetry    SR with sinus bradycardia with rates in the upper 50s to 60s bpm, rare PVCs, 3 runs of narrow complex tachycardia consistent with atrial tachycardia in the 120s bpm lasting up to 5 minutes - Personally Reviewed  ECG    No new tracings - Personally Reviewed  Physical Exam   GEN: No acute distress.   Neck: JVD elevated to the angle of the mandible. Cardiac: RRR, no murmurs, rubs, or gallops.  Respiratory: Diminished breath sounds along the bases bilaterally.  GI: Soft, nontender, non-distended.   MS: No edema; No deformity. Neuro:  Alert and oriented x 3; Nonfocal.  Psych: Normal affect.  Labs    Chemistry Recent Labs  Lab 02/10/23 1120  02/10/23 1322 02/11/23 0546  NA 136  --  134*  K 4.4  --  3.7  CL 102  --  101  CO2 19*  --  23  GLUCOSE 110*  --  74  BUN 22  --  30*  CREATININE 1.37*  --  1.20  CALCIUM 8.7*  --  8.3*  PROT  --  6.6 5.8*  ALBUMIN  --  3.4* 3.2*  AST  --  124* 193*  ALT  --  46* 106*  ALKPHOS  --  58 52  BILITOT  --  2.1* 1.4*  GFRNONAA 53*  --  >60  ANIONGAP 15  --  10     Hematology Recent Labs  Lab 02/10/23 1120 02/11/23 0546  WBC 10.1 6.2  RBC 4.79 4.37  HGB 12.6* 11.6*  HCT 41.8 35.3*  MCV 87.3 80.8  MCH 26.3 26.5  MCHC 30.1 32.9  RDW 20.4* 20.0*  PLT 237 176    Cardiac EnzymesNo results for input(s): "TROPONINI" in the last 168 hours. No results for input(s): "TROPIPOC" in the last 168 hours.   BNP Recent Labs  Lab 02/10/23 1120  BNP >4,500.0*     DDimer No results for input(s): "DDIMER" in the last 168 hours.   Radiology    US ABDOMEN  LIMITED RUQ (LIVER/GB)  Result Date: 02/10/2023 IMPRESSION: Abnormal gallbladder with wall thickening, edema with stones and sludge. Acute cholecystitis is possible. If needed further workup with HIDA scan. No ductal dilatation. Right-sided pleural effusion. Electronically Signed   By: Karen Kays M.D.   On: 02/10/2023 15:00   CT ABDOMEN PELVIS W CONTRAST  Result Date: 02/10/2023 IMPRESSION: Moderate gallbladder wall thickening is noted without cholelithiasis. Ultrasound or HIDA scan is recommended to evaluate for acalculous cholecystitis. Moderate bilateral pleural effusions. Moderate size fat containing periumbilical hernia. Minimal free fluid is noted in the pelvis of uncertain etiology. Aortic Atherosclerosis (ICD10-I70.0). Electronically Signed   By: Lupita Raider M.D.   On: 02/10/2023 13:34   CT Head Wo Contrast  Result Date: 02/10/2023 IMPRESSION: 1. No acute intracranial findings. 2. Chronic microvascular ischemic change and cerebral volume loss. Electronically Signed   By: Duanne Guess D.O.   On: 02/10/2023 13:16    CT Angio Chest PE W and/or Wo Contrast  Result Date: 02/10/2023 IMPRESSION: No definite evidence of pulmonary embolus. Moderate size right pleural effusion is noted with small left pleural effusion. Right middle lobe opacity is noted concerning for atelectasis or pneumonia. Mild left basilar opacity is noted most consistent with atelectasis or infiltrate. Aortic Atherosclerosis (ICD10-I70.0). Electronically Signed   By: Lupita Raider M.D.   On: 02/10/2023 13:14   DG Chest 2 View  Result Date: 02/10/2023 IMPRESSION: 1. Small right and moderate left pleural effusions. 2. Bibasilar patchy opacities, likely atelectasis. Aspiration or pneumonia can be considered in the appropriate clinical setting. Electronically Signed   By: Agustin Cree M.D.   On: 02/10/2023 12:12    Cardiac Studies   2D echo 01/16/2023: 1. Left ventricular ejection fraction, by estimation, is <20%. The left  ventricle has severely decreased function. The left ventricle demonstrates  global hypokinesis. The left ventricular internal cavity size was mildly  dilated. Left ventricular  diastolic parameters are indeterminate.   2. Right ventricular systolic function is moderately reduced. The right  ventricular size is mildly enlarged. There is moderately elevated  pulmonary artery systolic pressure.   3. Left atrial size was mild to moderately dilated.   4. Right atrial size was mildly dilated.   5. Moderate pleural effusion in the left lateral region.   6. The mitral valve is normal in structure. Moderate to severe mitral  valve regurgitation. No evidence of mitral stenosis.   7. The aortic valve is normal in structure. Aortic valve regurgitation is  not visualized. Aortic valve sclerosis/calcification is present, without  any evidence of aortic stenosis.   8. The inferior vena cava is dilated in size with <50% respiratory  variability, suggesting right atrial pressure of 15 mmHg.   Patient Profile     77 y.o. male  with history of recently diagnosed HFrEF by echo in early 01/2023, moderate to severe mitral regurgitation, and HTN who we are seeing for acute on chronic HFrEF with pleural effusion, and NSTEMI at the request of Dr. Sedalia Muta.   Assessment & Plan    1. Acute on chronic HFrEF with pleural effusion and mitral regurgitation: -Remains volume up -BUN up trending with improved SCr -If we have difficulty diuresing may need to consider transferring to the ICU for initiation of milrinone and Lasix gtts to augment diuresis, would also benefit from Coox and CVP evaluations -Plan for advanced heart failure consult tomorrow  -Continue IV Lasix 40 mg bid with KCl repletion  -Not currently on any GDMT, limited by  BP and bradycardia, escalate as able -Bradycardia and decompensated HFrEF preclude beta blocker at this time -Escalate GDMT as able moving forward -Plan for [facility] next week following diuresis -Look to follow up echo in several months to reassess LVSF -Daily weights -Strict I/O -Patient reports he wants to go home and may leave AMA -Despite detailed discussion with him, up to and including death -He understands this risk in sound state of mind and continues to report possible leaving AMA -High risk of death, patient aware and understands   2. NSTEMI: -Currently, without chest pain -High sensitivity troponin trended to 13871 -Heparin gtt for 48 hours -ASA -Will need R/LHC next week when he is more euvolemic, if he does not leave AMA -LDL 43, defer statin at this time with abnormal LFT  3. Transaminitis: -Ultrasound concerning for acute cholecystitis  -Cannot exclude some degree of congestive hepatopathy as well  -Patient would be high risk for surgery at this point  -Per IM  4. Atrial tachycardia: -Self limiting  -Potassium normal -Check magnesium and TSH -Baseline bradycardia and decompensated HFrEF preclude beta blocker at this time       For questions or updates, please contact  [facility] Please consult [email or web address] for contact info under Cardiology/STEMI.    Signed, Eula Listen, PA-C [facility] Pager: [phone] 02/11/2023, 8:25 AM

## 2023-02-11 NOTE — Plan of Care (Signed)

## 2023-02-11 NOTE — Consult Note (Signed)
PHARMACY - ANTICOAGULATION CONSULT NOTE  Pharmacy Consult for heparin  Indication: chest pain/ACS  No Known Allergies  Patient Measurements: Height: 5' 7.99" (172.7 cm) Weight: 63.7 kg (140 lb 6.9 oz) IBW/kg (Calculated) : 68.38 Heparin Dosing Weight: 63.7 kg  Vital Signs: Temp: 98.4 F (36.9 C) (12/01 0414) Temp Source: Oral (11/30 2332) BP: 118/74 (12/01 0414) Pulse Rate: 60 (12/01 0414)  Labs: Recent Labs    02/10/23 1120 02/10/23 1225 02/10/23 2050 02/11/23 0546  HGB 12.6*  --   --  11.6*  HCT 41.8  --   --  35.3*  PLT 237  --   --  176  APTT  --  34  --   --   LABPROT  --  17.5*  --   --   INR  --  1.4*  --   --   HEPARINUNFRC  --   --  0.16* 0.12*  CREATININE 1.37*  --   --   --   TROPONINIHS 13,606* 40,981*  --   --     Estimated Creatinine Clearance: 40.7 mL/min (A) (by C-G formula based on SCr of 1.37 mg/dL (H)).   Medical History: Past Medical History:  Diagnosis Date   Heart failure (HCC)     Medications:  Not on anticoagulation PTA  Assessment: Patient is a 77 y.o. male who presented to the ED on 02/10/2023 with SOB and chest pain. PMH includes HFrEF with EF < 20%. On admission BNP > 4,500 and troponins 13,606.   Date/Time  HL Rate  Comment 11/30 2050 0.16 750 units/hr Subtherapeutic 12/01 0546 0.12 950 units/hr subtherapeutic  Goal of Therapy:  Heparin level 0.3-0.7 units/ml Monitor platelets by anticoagulation protocol: Yes   Plan:  - Heparin level subtherapeutic  - Bolus 1900 units IV heparin  - Increase heparin drip at 1050 units/hr  - 8 hour heparin level, daily heparin level and CBC    Otelia Sergeant, PharmD, [facility] 02/11/2023 6:22 AM

## 2023-02-11 NOTE — Progress Notes (Signed)
Progress Note   Patient: Walter Vargas:[id number] DOB: 1945-11-28 DOA: 02/10/2023     1 DOS: the patient was seen and examined on 02/11/2023   Brief hospital course: [name] is a 77 year old male with history of hypertension, heart failure reduced ejection fraction, who presents emergency department for chief concerns of chest pain, shortness of breath, and left-sided abdominal pain. Patient recently had echocardiogram 01/16/2023 showed ejection fraction less than 20% with moderate to severe mitral regurgitation.  Lab work showed BNP was elevated > 4500.  High sensitive troponin was 13,606 and on repeat was 13,871. CT abdomen pelvis with contrast: Was read as moderate gallbladder wall thickening is noted without cholelithiasis.  Ultrasound or HIDA scan recommended to evaluate acalculous cholecystitis.  Moderate bilateral pleural effusion. Patient is seen by cardiology, started on heparin drip and IV Lasix.  HIDA scan was also ordered.   Principal Problem:   Acute exacerbation of CHF (congestive heart failure) (HCC) Active Problems:   NSTEMI (non-ST elevated myocardial infarction) (HCC)   Thickening of wall of gallbladder   Elevated LFTs   Dyspnea on exertion   Pleural effusion   Elevated brain natriuretic peptide (BNP) level   Assessment and Plan:  *Acute on chronic systolic congestive heart failure. Moderate to severe mitral regurgitation. Bilateral pleural effusion secondary to CHF. Patient had severe systolic dysfunction with ejection fraction less than 20%.  Patient was volume overloaded at the time of admission, will continue IV Lasix. Patient is a followed by cardiology to consider GDMT. Will continue monitor closely.  NSTEMI (non-ST elevated myocardial infarction) (HCC) Significant elevation troponin, followed by cardiology consider heart cath.  Elevated LFTs Thickening of wall of gallbladder CT scan showed thickened wall of the gallbladder, right upper quadrant  ultrasound also showed thickened bladder wall with edema, but clinically, patient does not have any evidence of infection.  Will wait for HIDA scan.  Acute kidney injury. Hyponatremia. Mild metabolic acidosis. Renal function is better after giving Lasix.  Will continue to monitor.  Patient wants to go home, but is not medically stable for discharge.  I have talked with the patient for the importance to stay in the hospital, he has very high risk about death if signout AMA.  He understood.  Hopefully he does not sign out AGAINST MEDICAL ADVICE.    Subjective:  Patient feels a lot better today with breathing.  Nonproductive cough.  No chest pain.  Physical Exam: Vitals:   02/10/23 2100 02/10/23 2332 02/11/23 0414 02/11/23 0815  BP:  103/65 118/74 113/72  Pulse:  (!) 59 60 (!) 53  Resp:  20 16 15   Temp:  98.1 F (36.7 C) 98.4 F (36.9 C) 97.8 F (36.6 C)  TempSrc:  Oral    SpO2:  98% 98% 98%  Weight: 63.7 kg     Height: 5' 7.99" (1.727 m)      General exam: Appears calm and comfortable  Respiratory system: Clear to auscultation. Respiratory effort normal. Cardiovascular system: Regular. No JVD, murmurs, rubs, gallops or clicks. No pedal edema. Gastrointestinal system: Abdomen is nondistended, soft and nontender. No organomegaly or masses felt. Normal bowel sounds heard. Central nervous system: Alert and oriented. No focal neurological deficits. Extremities: Symmetric 5 x 5 power. Skin: No rashes, lesions or ulcers Psychiatry: Judgement and insight appear normal. Mood & affect appropriate.    Data Reviewed:  Reviewed lab results, ultrasound and CT scan results.  Family Communication: None  Disposition: Status is: Inpatient Remains inpatient appropriate because: Severity  of disease, IV treatment.     Time spent: 55 minutes  Author: Marrion Coy, MD 02/11/2023 11:40 AM  For on call review [email or web address].

## 2023-02-11 NOTE — Consult Note (Signed)
PHARMACY - ANTICOAGULATION CONSULT NOTE  Pharmacy Consult for heparin  Indication: chest pain/ACS  No Known Allergies  Patient Measurements: Height: 5' 7.99" (172.7 cm) Weight: 63.7 kg (140 lb 6.9 oz) IBW/kg (Calculated) : 68.38 Heparin Dosing Weight: 63.7 kg  Vital Signs: Temp: 97.8 F (36.6 C) (12/01 1244) BP: 102/76 (12/01 1244) Pulse Rate: 75 (12/01 1244)  Labs: Recent Labs    02/10/23 1120 02/10/23 1225 02/10/23 2050 02/11/23 0546 02/11/23 1512  HGB 12.6*  --   --  11.6*  --   HCT 41.8  --   --  35.3*  --   PLT 237  --   --  176  --   APTT  --  34  --   --   --   LABPROT  --  17.5*  --   --   --   INR  --  1.4*  --   --   --   HEPARINUNFRC  --   --  0.16* 0.12* 0.17*  CREATININE 1.37*  --   --  1.20  --   TROPONINIHS 13,606* 13,244*  --   --   --     Estimated Creatinine Clearance: 46.4 mL/min (by C-G formula based on SCr of 1.2 mg/dL).   Medical History: Past Medical History:  Diagnosis Date   Essential hypertension    HFrEF (heart failure with reduced ejection fraction) (HCC)    Mitral regurgitation     Medications:  Not on anticoagulation PTA  Assessment: Patient is a 77 y.o. male who presented to the ED on 02/10/2023 with SOB and chest pain. PMH includes HFrEF with EF < 20%. On admission BNP > 4,500 and troponins 13,606.   Date/Time  HL Rate  Comment 11/30 2050 0.16 750 units/hr Subtherapeutic 12/01 0546 0.12 950 units/hr Subtherapeutic 12/01 1512 0.17 1050 units/hr Subtherapeutic  Goal of Therapy:  Heparin level 0.3-0.7 units/ml Monitor platelets by anticoagulation protocol: Yes   Plan:  - Heparin level subtherapeutic  - Bolus 1900 units IV heparin  - Increase heparin drip to 1250 units/hr  - 8 hour heparin level, daily heparin level and CBC    Paulita Fujita, PharmD Clinical Pharmacist 02/11/2023 3:44 PM

## 2023-02-11 NOTE — Plan of Care (Signed)
  Problem: Education: Goal: Knowledge of General Education information will improve Description: Including pain rating scale, medication(s)/side effects and non-pharmacologic comfort measures Outcome: Not Progressing   Problem: Health Behavior/Discharge Planning: Goal: Ability to manage health-related needs will improve Outcome: Not Progressing   Problem: Clinical Measurements: Goal: Ability to maintain clinical measurements within normal limits will improve Outcome: Not Progressing Goal: Will remain free from infection Outcome: Not Progressing Goal: Diagnostic test results will improve Outcome: Not Progressing Goal: Respiratory complications will improve Outcome: Not Progressing Goal: Cardiovascular complication will be avoided Outcome: Not Progressing   Problem: Activity: Goal: Risk for activity intolerance will decrease Outcome: Not Progressing   Problem: Nutrition: Goal: Adequate nutrition will be maintained Outcome: Not Progressing   Problem: Coping: Goal: Level of anxiety will decrease Outcome: Not Progressing   Problem: Elimination: Goal: Will not experience complications related to bowel motility Outcome: Not Progressing Goal: Will not experience complications related to urinary retention Outcome: Not Progressing   Problem: Pain Management: Goal: General experience of comfort will improve Outcome: Not Progressing   Problem: Safety: Goal: Ability to remain free from injury will improve Outcome: Not Progressing   Problem: Skin Integrity: Goal: Risk for impaired skin integrity will decrease Outcome: Not Progressing   Problem: Education: Goal: Ability to demonstrate management of disease process will improve Outcome: Not Progressing Goal: Ability to verbalize understanding of medication therapies will improve Outcome: Not Progressing Goal: Individualized Educational Video(s) Outcome: Not Progressing   Problem: Activity: Goal: Capacity to carry out  activities will improve Outcome: Not Progressing   Problem: Cardiac: Goal: Ability to achieve and maintain adequate cardiopulmonary perfusion will improve Outcome: Not Progressing

## 2023-02-12 ENCOUNTER — Inpatient Hospital Stay: Payer: 59

## 2023-02-12 DIAGNOSIS — K828 Other specified diseases of gallbladder: Secondary | ICD-10-CM | POA: Diagnosis not present

## 2023-02-12 DIAGNOSIS — I5023 Acute on chronic systolic (congestive) heart failure: Secondary | ICD-10-CM | POA: Diagnosis not present

## 2023-02-12 DIAGNOSIS — I214 Non-ST elevation (NSTEMI) myocardial infarction: Secondary | ICD-10-CM | POA: Diagnosis not present

## 2023-02-12 LAB — BASIC METABOLIC PANEL
Anion gap: 8 (ref 5–15)
BUN: 26 mg/dL — ABNORMAL HIGH (ref 8–23)
CO2: 26 mmol/L (ref 22–32)
Calcium: 8 mg/dL — ABNORMAL LOW (ref 8.9–10.3)
Chloride: 102 mmol/L (ref 98–111)
Creatinine, Ser: 0.87 mg/dL (ref 0.61–1.24)
GFR, Estimated: >60 mL/min (ref >60)
Glucose, Bld: 67 mg/dL — ABNORMAL LOW (ref 70–99)
Potassium: 3.4 mmol/L — ABNORMAL LOW (ref 3.5–5.1)
Sodium: 136 mmol/L (ref 135–145)

## 2023-02-12 LAB — CBC
HCT: 35.3 % — ABNORMAL LOW (ref 39.0–52.0)
Hemoglobin: 11.6 g/dL — ABNORMAL LOW (ref 13.0–17.0)
MCH: 26.7 pg (ref 26.0–34.0)
MCHC: 32.9 g/dL (ref 30.0–36.0)
MCV: 81.3 fL (ref 80.0–100.0)
Platelets: 198 10*3/uL (ref 150–400)
RBC: 4.34 MIL/uL (ref 4.22–5.81)
RDW: 19.6 % — ABNORMAL HIGH (ref 11.5–15.5)
WBC: 5.3 10*3/uL (ref 4.0–10.5)
nRBC: 0 % (ref 0.0–0.2)

## 2023-02-12 LAB — HEPARIN LEVEL (UNFRACTIONATED)
Heparin Unfractionated: <0.1 [IU]/mL — ABNORMAL LOW (ref 0.30–0.70)
Heparin Unfractionated: 0.35 [IU]/mL (ref 0.30–0.70)
Heparin Unfractionated: 0.43 [IU]/mL (ref 0.30–0.70)

## 2023-02-12 LAB — MAGNESIUM: Magnesium: 1.9 mg/dL (ref 1.7–2.4)

## 2023-02-12 MED ORDER — SPIRONOLACTONE 12.5 MG HALF TABLET
12.5000 mg | ORAL_TABLET | Freq: Every day | ORAL | Status: DC
  Administered 2023-02-12 – 2023-02-14 (×3): 12.5 mg via ORAL
  Filled 2023-02-12 (×3): qty 1

## 2023-02-12 MED ORDER — MAGNESIUM SULFATE IN D5W 1-5 GM/100ML-% IV SOLN
1.0000 g | Freq: Once | INTRAVENOUS | Status: AC
Start: 2023-02-12 — End: 2023-02-12
  Administered 2023-02-12: 1 g via INTRAVENOUS
  Filled 2023-02-12 (×2): qty 100

## 2023-02-12 MED ORDER — POTASSIUM CHLORIDE CRYS ER 20 MEQ PO TBCR
40.0000 meq | EXTENDED_RELEASE_TABLET | ORAL | Status: AC
  Administered 2023-02-12 (×2): 40 meq via ORAL
  Filled 2023-02-12 (×2): qty 2

## 2023-02-12 MED ORDER — TECHNETIUM TC 99M MEBROFENIN IV KIT
5.0000 | PACK | Freq: Once | INTRAVENOUS | Status: AC | PRN
  Administered 2023-02-12: 5 via INTRAVENOUS

## 2023-02-12 MED ORDER — HEPARIN BOLUS VIA INFUSION
1900.0000 [IU] | Freq: Once | INTRAVENOUS | Status: AC
  Administered 2023-02-12: 1900 [IU] via INTRAVENOUS
  Filled 2023-02-12: qty 1900

## 2023-02-12 NOTE — TOC Initial Note (Signed)
Transition of Care [facility]) - Initial/Assessment Note    Patient Details  Name: Walter Vargas MRN: [id number] Date of Birth: 09/08/1945  [facility]) CM/SW Contact:    Margarito Liner, LCSW Phone Number: 02/12/2023, 12:11 PM  Clinical Narrative:   CSW had a message to contact patient's son. He was not at bedside so patient gave permission to call him. Son asked about post-acute care options. Discussed SNF vs home health vs personal care services. MD has consulted PT and OT so we can determine patient's needs. Son will likely return to [place] tomorrow night so he asked if we could get him home if he or other family are not in the area at time of discharge. Discussed cab vs Safe Transport options.              Expected Discharge Plan:  (TBD) Barriers to Discharge: Continued Medical Work up   Patient Goals and CMS Choice            Expected Discharge Plan and Services     Post Acute Care Choice:  (TBD) Living arrangements for the past 2 months: Apartment                                      Prior Living Arrangements/Services Living arrangements for the past 2 months: Apartment Lives with:: Self Patient language and need for interpreter reviewed:: Yes Do you feel safe going back to the place where you live?: Yes      Need for Family Participation in Patient Care: Yes (Comment)     Criminal Activity/Legal Involvement Pertinent to Current Situation/Hospitalization: No - Comment as needed  Activities of Daily Living   ADL Screening (condition at time of admission) Independently performs ADLs?: Yes (appropriate for developmental age) Is the patient deaf or have difficulty hearing?: No Does the patient have difficulty seeing, even when wearing glasses/contacts?: No Does the patient have difficulty concentrating, remembering, or making decisions?: No  Permission Sought/Granted Permission sought to share information with : Family Supports Permission granted  to share information with : Yes, Verbal Permission Granted  Share Information with NAME: Gustavus Basaldua     Permission granted to share info w Relationship: Son  Permission granted to share info w Contact Information: [phone]  Emotional Assessment Appearance:: Appears stated age Attitude/Demeanor/Rapport: Engaged, Gracious Affect (typically observed): Accepting, Appropriate, Calm, Pleasant Orientation: : Oriented to Self, Oriented to Place, Oriented to  Time, Oriented to Situation Alcohol / Substance Use: Not Applicable Psych Involvement: No (comment)  Admission diagnosis:  NSTEMI (non-ST elevated myocardial infarction) (HCC) [I21.4] Acute exacerbation of CHF (congestive heart failure) (HCC) [I50.9] Acute on chronic systolic congestive heart failure (HCC) [I50.23] AKI (acute kidney injury) (HCC) [N17.9] Patient Active Problem List   Diagnosis Date Noted   Acute exacerbation of CHF (congestive heart failure) (HCC) 02/10/2023   NSTEMI (non-ST elevated myocardial infarction) (HCC) 02/10/2023   Thickening of wall of gallbladder 02/10/2023   Elevated LFTs 02/10/2023   Chronic diastolic congestive heart failure (HCC) 02/05/2023   Pleural effusion 10/19/2022   Elevated brain natriuretic peptide (BNP) level 10/19/2022   Dyspnea on exertion 10/12/2022   Lower extremity edema 10/12/2022   Elevated blood pressure reading 06/21/2022   Callus 06/21/2022   Umbilical hernia without obstruction and without gangrene 06/21/2022   Impacted cerumen of right ear 05/26/2021   Chronic pain of both knees 05/26/2021   Preventative health  care 05/26/2021   Screening for colon cancer 05/26/2021   PCP:  [name], NP Pharmacy:   [facility] [phone] [place], [place] - [address] [place] [address] Phone: [phone] Fax: [phone]     Social Determinants of Health (SDOH) Social History: SDOH Screenings   Food Insecurity: No Food Insecurity (02/10/2023)   Housing: Low Risk  (02/10/2023)  Transportation Needs: No Transportation Needs (02/10/2023)  Utilities: Not At Risk (02/10/2023)  Depression (PHQ2-9): Low Risk  (10/19/2022)  Tobacco Use: Medium Risk (02/10/2023)   SDOH Interventions:     Readmission Risk Interventions     No data to display

## 2023-02-12 NOTE — Consult Note (Signed)
PHARMACY - ANTICOAGULATION CONSULT NOTE  Pharmacy Consult for heparin  Indication: chest pain/ACS  No Known Allergies  Patient Measurements: Height: 5' 7.99" (172.7 cm) Weight: 63.7 kg (140 lb 6.9 oz) IBW/kg (Calculated) : 68.38 Heparin Dosing Weight: 63.7 kg  Vital Signs: Temp: 98.6 F (37 C) (12/02 0853) Temp Source: Oral (12/02 0853) BP: 110/61 (12/02 0853) Pulse Rate: 76 (12/02 0853)  Labs: Recent Labs    02/10/23 1120 02/10/23 1225 02/10/23 2050 02/11/23 0546 02/11/23 1512 02/12/23 0021 02/12/23 0501 02/12/23 1020  HGB 12.6*  --   --  11.6*  --   --  11.6*  --   HCT 41.8  --   --  35.3*  --   --  35.3*  --   PLT 237  --   --  176  --   --  198  --   APTT  --  34  --   --   --   --   --   --   LABPROT  --  17.5*  --   --   --   --   --   --   INR  --  1.4*  --   --   --   --   --   --   HEPARINUNFRC  --   --    < > 0.12* 0.17* <0.10*  --  0.35  CREATININE 1.37*  --   --  1.20  --   --  0.87  --   TROPONINIHS 13,606* 78,295*  --   --   --   --   --   --    < > = values in this interval not displayed.    Estimated Creatinine Clearance: 64.1 mL/min (by C-G formula based on SCr of 0.87 mg/dL).   Medical History: Past Medical History:  Diagnosis Date   Essential hypertension    HFrEF (heart failure with reduced ejection fraction) (HCC)    Mitral regurgitation     Medications:  Not on anticoagulation PTA  Assessment: Patient is a 77 y.o. male who presented to the ED on 02/10/2023 with SOB and chest pain. PMH includes HFrEF with EF < 20 RV mod reduced and mildly enlarged, mod pulm artery pressure, LA mod dilated, RA mildly dilated, mod pleural effusion, severe MVR. On admission BNP > 4,500 and troponins 13,606.   Date/Time  HL Rate  Comment 11/30 2050 0.16 750 units/hr Subtherapeutic 12/01 0546 0.12 950 units/hr Subtherapeutic 12/01 1512 0.17 1050 units/hr Subtherapeutic 12/02 0023 <0.1 1250 units/hr Subtherapeutic 12/02 1020 0.35  Goal of Therapy:   Heparin level 0.3-0.7 units/ml Monitor platelets by anticoagulation protocol: Yes   Plan:  Heparin level is therapeutic. Will continue heparin infusion at 1250 units/hr. Recheck heparin level in 8 hours. CBC with AM labs.   Paschal Dopp, PharmD, 02/12/2023 11:25 AM

## 2023-02-12 NOTE — Progress Notes (Signed)
Consult to HF Navigation Team Placed. Unfortunately, this patient does not meet criteria given the patient is followed by advanced heart failure. HF navigation team will sign off, but please feel free to reach out with any specific questions or needs.  Thank you for involving the HF Navigation Team in this patient's care.  Enos Fling, PharmD, BCPS Clinical Pharmacist 10/19/2022 12:50 PM

## 2023-02-12 NOTE — Consult Note (Addendum)
PHARMACY - ANTICOAGULATION CONSULT NOTE  Pharmacy Consult for heparin  Indication: chest pain/ACS  No Known Allergies  Patient Measurements: Height: 5' 7.99" (172.7 cm) Weight: 63.7 kg (140 lb 6.9 oz) IBW/kg (Calculated) : 68.38 Heparin Dosing Weight: 63.7 kg  Vital Signs: Temp: 98.4 F (36.9 C) (12/02 0011) Temp Source: Oral (12/01 1643) BP: 103/59 (12/02 0011) Pulse Rate: 60 (12/02 0011)  Labs: Recent Labs    02/10/23 1120 02/10/23 1225 02/10/23 2050 02/11/23 0546 02/11/23 1512 02/12/23 0021  HGB 12.6*  --   --  11.6*  --   --   HCT 41.8  --   --  35.3*  --   --   PLT 237  --   --  176  --   --   APTT  --  34  --   --   --   --   LABPROT  --  17.5*  --   --   --   --   INR  --  1.4*  --   --   --   --   HEPARINUNFRC  --   --    < > 0.12* 0.17* <0.10*  CREATININE 1.37*  --   --  1.20  --   --   TROPONINIHS 13,606* 13,871*  --   --   --   --    < > = values in this interval not displayed.    Estimated Creatinine Clearance: 46.4 mL/min (by C-G formula based on SCr of 1.2 mg/dL).   Medical History: Past Medical History:  Diagnosis Date   Essential hypertension    HFrEF (heart failure with reduced ejection fraction) (HCC)    Mitral regurgitation     Medications:  Not on anticoagulation PTA  Assessment: Patient is a 77 y.o. male who presented to the ED on 02/10/2023 with SOB and chest pain. PMH includes HFrEF with EF < 20%. On admission BNP > 4,500 and troponins 13,606.   Date/Time  HL Rate  Comment 11/30 2050 0.16 750 units/hr Subtherapeutic 12/01 0546 0.12 950 units/hr Subtherapeutic 12/01 1512 0.17 1050 units/hr Subtherapeutic 12/02 0023 >0.1 1250 units/hr Subtherapeutic  Goal of Therapy:  Heparin level 0.3-0.7 units/ml Monitor platelets by anticoagulation protocol: Yes   Plan:  -Contacted RN.  RN checked and discovered leakage at heparin infusion site. - Rebolus 1900 units IV heparin  - Continue heparin drip at 1250 units/hr  - 8 hour heparin  level, daily heparin level and CBC   Otelia Sergeant, PharmD, [facility] 02/12/2023 1:35 AM

## 2023-02-12 NOTE — Progress Notes (Signed)
Heart Failure Navigator Progress Note  Patient is being followed by the Advanced Heart Failure Team. Patient respectfully declined the offered Heart Failure education from the Nurse Navigator.   Navigator will sign off at this time.  Roxy Horseman, RN, BSN [facility] Heart Failure Navigator Secure Chat Only

## 2023-02-12 NOTE — Progress Notes (Signed)
Transported off unit by Patient Transporter to Nuclear Medicine at this time.

## 2023-02-12 NOTE — Plan of Care (Signed)
Intermittently confused. Went for Nuclear Medicine scan. Educated on plan for Cardiac Cath tomorrow. NPO after MN order in.   Problem: Education: Goal: Knowledge of General Education information will improve Description: Including pain rating scale, medication(s)/side effects and non-pharmacologic comfort measures Outcome: Progressing   Problem: Health Behavior/Discharge Planning: Goal: Ability to manage health-related needs will improve Outcome: Progressing   Problem: Clinical Measurements: Goal: Ability to maintain clinical measurements within normal limits will improve Outcome: Progressing Goal: Will remain free from infection Outcome: Progressing Goal: Diagnostic test results will improve Outcome: Progressing Goal: Respiratory complications will improve Outcome: Progressing Goal: Cardiovascular complication will be avoided Outcome: Progressing

## 2023-02-12 NOTE — Consult Note (Signed)
PHARMACY - ANTICOAGULATION CONSULT NOTE  Pharmacy Consult for heparin  Indication: chest pain/ACS  No Known Allergies  Patient Measurements: Height: 5' 7.99" (172.7 cm) Weight: 63.7 kg (140 lb 6.9 oz) IBW/kg (Calculated) : 68.38 Heparin Dosing Weight: 63.7 kg  Vital Signs: Temp: 97.9 F (36.6 C) (12/02 1717) Temp Source: Oral (12/02 1717) BP: 96/68 (12/02 1717) Pulse Rate: 76 (12/02 1717)  Labs: Recent Labs    02/10/23 1120 02/10/23 1225 02/10/23 2050 02/11/23 0546 02/11/23 1512 02/12/23 0021 02/12/23 0501 02/12/23 1020 02/12/23 1738  HGB 12.6*  --   --  11.6*  --   --  11.6*  --   --   HCT 41.8  --   --  35.3*  --   --  35.3*  --   --   PLT 237  --   --  176  --   --  198  --   --   APTT  --  34  --   --   --   --   --   --   --   LABPROT  --  17.5*  --   --   --   --   --   --   --   INR  --  1.4*  --   --   --   --   --   --   --   HEPARINUNFRC  --   --    < > 0.12*   < > <0.10*  --  0.35 0.43  CREATININE 1.37*  --   --  1.20  --   --  0.87  --   --   TROPONINIHS 13,606* 78,295*  --   --   --   --   --   --   --    < > = values in this interval not displayed.    Estimated Creatinine Clearance: 64.1 mL/min (by C-G formula based on SCr of 0.87 mg/dL).   Medical History: Past Medical History:  Diagnosis Date   Essential hypertension    HFrEF (heart failure with reduced ejection fraction) (HCC)    Mitral regurgitation     Medications:  Not on anticoagulation PTA  Assessment: Patient is a 77 y.o. male who presented to the ED on 02/10/2023 with SOB and chest pain. PMH includes HFrEF with EF < 20 RV mod reduced and mildly enlarged, mod pulm artery pressure, LA mod dilated, RA mildly dilated, mod pleural effusion, severe MVR. On admission BNP > 4,500 and troponins 13,606.   Date/Time  HL Rate  Comment 11/30 2050 0.16 750 units/hr Subtherapeutic 12/01 0546 0.12 950 units/hr Subtherapeutic 12/01 1512 0.17 1050 units/hr Subtherapeutic 12/02 0023 <0.1 1250  units/hr Subtherapeutic 12/02 1020 0.35 1250 units/hr Therapeutic x1 12/02 1738 0.43 1250 units/hr  Therapeutic x2  Goal of Therapy:  Heparin level 0.3-0.7 units/ml Monitor platelets by anticoagulation protocol: Yes   Plan:  Anti-Xa level remains therapeutic at 0.43 on 1250 units/hr. No issues with infusion reported, no signs/symptoms of bleeding noted. Continue heparin infusion at 1250 units/hour Monitor daily Anti-Xa levels while on heparin infusion Monitor CBC and signs/symptoms of bleeding F/u plans for left and right heart cath on 02/13/23  Thank you for involving pharmacy in this patient's care.   Rockwell [place], PharmD Clinical Pharmacist 02/12/2023 6:52 PM

## 2023-02-12 NOTE — Consult Note (Addendum)
Advanced Heart Failure Team Consult Note   Primary Physician: [name], NP PCP-Cardiologist:  None  Reason for Consultation: Acute/Chronic HFrEF  HPI:    Walter Vargas is seen today for evaluation of Acute HFrEF at the request of Dr Duke Salvia.   Walter Vargas is a 77 year old with a history of HTN, Walter, and recently diagnosed HFrEF.  Has a brother with heart disease.   In August he was saw PCP for shortness of breath and lower extremity edema. BNP was elelvated. He was started on lasix and set up for ECHO. He delayed ECHO and cardiology appointment. Echo 01/2023 Global HK, LVEF < 20% RV moderately reduced, LA mild-mod dilated, RA mildly dilated, MV mod-Severe Walter.   Presented to ED with chest pain and shortness of breath. Admitted with A/C HFrEF.EKG : SR 96 bpm /  CXR small right pleural effusion and moderate left pleural effusion. CTA - negative for PE. CT abdo- mod gall bladder thickening and moderate pleural effusions. Abd US-Gall bladder abnormal with edema and stones. Pertinent labs included: HS Trop 78295>62130, hgb 12.6, K 4.4, creatinine 1.37, and BNP >4500.  Placed on IV lasix and heparin drip.  Cardiology consulted. Agreed with IV lasix and considering cath.   Denies SOB.    Home Medications Prior to Admission medications   Medication Sig Start Date End Date Taking? Authorizing Provider  Ascorbic Acid (VITAMIN C) 1000 MG tablet Take 1,000 mg by mouth daily.   Yes [provider]  aspirin EC 81 MG tablet Take by mouth daily. Swallow whole. 1/2 TABLET DAILY   Yes [provider]  furosemide (LASIX) 20 MG tablet Take 2 tablets (40 mg total) by mouth daily. Patient taking differently: Take 20 mg by mouth daily. 01/01/23  Yes [name], NP  metoprolol succinate (TOPROL-XL) 25 MG 24 hr tablet TAKE 1 TABLET (25 MG TOTAL) BY MOUTH DAILY. 01/23/23  Yes [name], NP  Multiple Vitamin (MULTIVITAMIN) tablet Take 1 tablet by mouth daily. daily   Yes  [provider]    Past Medical History: Past Medical History:  Diagnosis Date   Essential hypertension    HFrEF (heart failure with reduced ejection fraction) (HCC)    Mitral regurgitation     Past Surgical History: Past Surgical History:  Procedure Laterality Date   NO PAST SURGERIES      Family History: Family History  Problem Relation Age of Onset   Diabetes Mother        gestational diabetes   Hypertension Father    Alcohol abuse Father    Heart disease Brother    Alcohol abuse Brother    Drug abuse Brother     Social History: Social History   Socioeconomic History   Marital status: Single    Spouse name: Not on file   Number of children: Not on file   Years of education: Not on file   Highest education level: Not on file  Occupational History   Not on file  Tobacco Use   Smoking status: Former    Current packs/day: 0.00    Average packs/day: 0.5 packs/day for 10.0 years (5.0 ttl pk-yrs)    Types: Cigarettes    Start date: 109    Quit date: 1978    Years since quitting: 46.9   Smokeless tobacco: Never  Vaping Use   Vaping status: Never Used  Substance and Sexual Activity   Alcohol use: Not Currently    Comment: no  alcohol since 77 years old   Drug use: Never   Sexual activity: Not Currently  Other Topics Concern   Not on file  Social History Narrative   Not on file   Social Determinants of Health   Financial Resource Strain: Not on file  Food Insecurity: No Food Insecurity (02/10/2023)   Hunger Vital Sign    Worried About Running Out of Food in the Last Year: Never true    Ran Out of Food in the Last Year: Never true  Transportation Needs: No Transportation Needs (02/10/2023)   PRAPARE - Administrator, Civil Service (Medical): No    Lack of Transportation (Non-Medical): No  Physical Activity: Not on file  Stress: Not on file  Social Connections: Not on file    Allergies:  No Known Allergies  Objective:     Vital Signs:   Temp:  [97.8 F (36.6 C)-98.6 F (37 C)] 98.6 F (37 C) (12/02 0853) Pulse Rate:  [60-76] 76 (12/02 0853) Resp:  [16-22] 22 (12/02 0853) BP: (102-110)/(59-93) 110/61 (12/02 0853) SpO2:  [97 %-100 %] 98 % (12/02 0853) Last BM Date : 02/11/23  Weight change: Filed Weights   02/10/23 2100  Weight: 63.7 kg    Intake/Output:   Intake/Output Summary (Last 24 hours) at 02/12/2023 1104 Last data filed at 02/12/2023 1000 Gross per 24 hour  Intake 249.78 ml  Output 3025 ml  Net -2775.22 ml      Physical Exam    General:  Sitting in the chair. No resp difficulty HEENT: normal Neck: supple. JVP 8-9  Carotids 2+ bilat; no bruits. No lymphadenopathy or thyromegaly appreciated. Cor: PMI nondisplaced. Regular rate & rhythm. No rubs, gallops or murmurs. Lungs: clear Abdomen: soft, nontender, nondistended. No hepatosplenomegaly. No bruits or masses. Good bowel sounds. Extremities: no cyanosis, clubbing, rash, edema Neuro: alert & orientedx3, cranial nerves grossly intact. moves all 4 extremities w/o difficulty. Affect pleasant   Telemetry   SR  EKG   SR 96 bpm    Labs   Basic Metabolic Panel: Recent Labs  Lab 02/10/23 1120 02/11/23 0545 02/11/23 0546 02/12/23 0501  NA 136  --  134* 136  K 4.4  --  3.7 3.4*  CL 102  --  101 102  CO2 19*  --  23 26  GLUCOSE 110*  --  74 67*  BUN 22  --  30* 26*  CREATININE 1.37*  --  1.20 0.87  CALCIUM 8.7*  --  8.3* 8.0*  MG  --  1.9  --  1.9    Liver Function Tests: Recent Labs  Lab 02/10/23 1322 02/11/23 0546  AST 124* 193*  ALT 46* 106*  ALKPHOS 58 52  BILITOT 2.1* 1.4*  PROT 6.6 5.8*  ALBUMIN 3.4* 3.2*   Recent Labs  Lab 02/10/23 1322  LIPASE 22   Recent Labs  Lab 02/10/23 1715  AMMONIA 14    CBC: Recent Labs  Lab 02/10/23 1120 02/11/23 0546 02/12/23 0501  WBC 10.1 6.2 5.3  HGB 12.6* 11.6* 11.6*  HCT 41.8 35.3* 35.3*  MCV 87.3 80.8 81.3  PLT 237 176 198    Cardiac Enzymes: No  results for input(s): "CKTOTAL", "CKMB", "CKMBINDEX", "TROPONINI" in the last 168 hours.  BNP: BNP (last 3 results) Recent Labs    02/10/23 1120  BNP >4,500.0*    ProBNP (last 3 results) Recent Labs    10/12/22 1030 10/19/22 0852 01/08/23 0957  PROBNP 2,878.0* 2,297.0* >4978.0*  CBG: No results for input(s): "GLUCAP" in the last 168 hours.  Coagulation Studies: Recent Labs    02/10/23 1225  LABPROT 17.5*  INR 1.4*     Imaging   No results found.   Medications:     Current Medications:  aspirin EC  81 mg Oral Daily   furosemide  60 mg Intravenous BID   potassium chloride  40 mEq Oral Q2H    Infusions:  heparin 1,250 Units/hr (02/12/23 3329)      Patient Profile   Walter Vargas is a 77 year old with a history of HTN, Walter, and recently diagnosed HFrEF.    Admitted with A/C biventricular heart failure.   Assessment/Plan   1. A/C HFrEF, Biventricular  Recently diagnosed 01/2023 with  global HK,severely reduced LVEF, moderately reduced RV, and mod-severe Walter. CO2 26. No previous cath. HS Trop >13K.  BNP > 4500 - Diuresed with IV lasix. Volume status improving. Adjust post cath.  - Plan for cath tomorrow. He is agreeable.  - Slowly add GDMT .  - Renal function ok.   2. NSTEMI  -Had chest tightness prior to admit. HS Trop 13K.  -On heparin drip.  - Plan cath RHC/LHC tomorrow by Dr End. He is agreeable.   3. Pleural Effusion Small Right and Mod Left . Follow with diuresis.   4. Gall Bladder Wall Thickening HIDDA scan pending.   5. Elevated LFTs AST 124>193 ALT 46>106   6. Mod/Severe Walter May need TEE to further assess.     Length of Stay: 2  Tonye Becket, NP  02/12/2023, 11:04 AM  Advanced Heart Failure Team Pager [phone] (M-F; 7a - 5p)  Please contact [facility] for night-coverage after hours (4p -7a ) and weekends on [email or web address]  Patient seen and examined with the above-signed Advanced Practice Provider and/or Housestaff. I  personally reviewed laboratory data, imaging studies and relevant notes. I independently examined the patient and formulated the important aspects of the plan. I have edited the note to reflect any of my changes or salient points. I have personally discussed the plan with the patient and/or family.  Denies CP or SOB. Remains on heparin. No bleeding. Pending HIDA scan  General:  Thin male sitting in chair No resp difficulty HEENT: normal Neck: supple. no JVD. Carotids 2+ bilat; no bruits. No lymphadenopathy or thryomegaly appreciated. Cor:  Regular rate & rhythm. No rubs, gallops or murmurs. Lungs: clear Abdomen: soft, nontender, nondistended. No hepatosplenomegaly. No bruits or masses. Good bowel sounds. Extremities: no cyanosis, clubbing, rash, edema Neuro: alert & orientedx3, cranial nerves grossly intact. moves all 4 extremities w/o difficulty. Affect pleasant  Volume status much improved. Plan R/L cath tomorrow. He will be on schedule for Dr. Okey Dupre (I d/w Dr. Kirke Corin). Concern for 3v CAD.  Wil lneed titration of GDMT post-cath. I will add spiro today.   Arvilla Meres, MD  4:08 PM

## 2023-02-12 NOTE — Progress Notes (Signed)
   02/12/23 1600  Note  Patient Observations Off unit at this time. Q4 Vital signs unable to be obtained.

## 2023-02-12 NOTE — Progress Notes (Signed)
  Progress Note   Patient: Walter Vargas:[id number] DOB: 1945-03-28 DOA: 02/10/2023     2 DOS: the patient was seen and examined on 02/12/2023   Brief hospital course: Walter Vargas is a 77 year old male with history of hypertension, heart failure reduced ejection fraction, who presents emergency department for chief concerns of chest pain, shortness of breath, and left-sided abdominal pain. Patient recently had echocardiogram 01/16/2023 showed ejection fraction less than 20% with moderate to severe mitral regurgitation.  Lab work showed BNP was elevated > 4500.  High sensitive troponin was 13,606 and on repeat was 13,871. CT abdomen pelvis with contrast: Was read as moderate gallbladder wall thickening is noted without cholelithiasis.  Ultrasound or HIDA scan recommended to evaluate acalculous cholecystitis.  Moderate bilateral pleural effusion. Patient is seen by cardiology, started on heparin drip and IV Lasix.  HIDA scan was also ordered.   Principal Problem:   Acute exacerbation of CHF (congestive heart failure) (HCC) Active Problems:   NSTEMI (non-ST elevated myocardial infarction) (HCC)   Thickening of wall of gallbladder   Elevated LFTs   Dyspnea on exertion   Pleural effusion   Elevated brain natriuretic peptide (BNP) level   Assessment and Plan:  Acute on chronic systolic congestive heart failure. Moderate to severe mitral regurgitation. Bilateral pleural effusion secondary to CHF. Patient had severe systolic dysfunction with ejection fraction less than 20%.  Patient was volume overloaded at the time of admission, patient is still on IV Lasix and followed by cardiology.  Will monitor electrolytes. Patient is a followed by cardiology to consider GDMT. Will continue monitor closely.   NSTEMI (non-ST elevated myocardial infarction) (HCC) Significant elevation troponin, followed by cardiology consider heart cath which is scheduled for tomorrow.   Elevated LFTs Thickening  of wall of gallbladder CT scan showed thickened wall of the gallbladder, right upper quadrant ultrasound also showed thickened bladder wall with edema, but clinically, patient does not have any evidence of infection.  HIDA scan scheduled for today.   Acute kidney injury. Hyponatremia. Mild metabolic acidosis. Hypokalemia. Condition stable, renal function normalized. Replete potassium.  Recheck a BMP tomorrow.     Subjective:  Patient doing much better today, no segment short of breath or chest pain.  Physical Exam: Vitals:   02/11/23 1940 02/12/23 0011 02/12/23 0352 02/12/23 0853  BP: 106/70 (!) 103/59 106/68 110/61  Pulse: 71 60 68 76  Resp: 18 18 18  (!) 22  Temp: 98.4 F (36.9 C) 98.4 F (36.9 C) 98 F (36.7 C) 98.6 F (37 C)  TempSrc:    Oral  SpO2: 98% 98% 97% 98%  Weight:      Height:       General exam: Appears calm and comfortable  Respiratory system: Clear to auscultation. Respiratory effort normal. Cardiovascular system: S1 & S2 heard, RRR. No JVD, murmurs, rubs, gallops or clicks. No pedal edema. Gastrointestinal system: Abdomen is nondistended, soft and nontender. No organomegaly or masses felt. Normal bowel sounds heard. Central nervous system: Alert and oriented x3. No focal neurological deficits. Extremities: Symmetric 5 x 5 power. Skin: No rashes, lesions or ulcers Psychiatry: Judgement and insight appear normal. Mood & affect appropriate.    Data Reviewed:  Lab results reviewed.  Family Communication: None  Disposition: Status is: Inpatient Remains inpatient appropriate because: Severity of disease, IV treatment.     Time spent: 35 minutes  Author: Marrion Coy, MD 02/12/2023 12:05 PM  For on call review [email or web address].

## 2023-02-12 NOTE — Progress Notes (Signed)
OT Cancellation Note  Patient Details Name: Walter Vargas MRN: [id number] DOB: January 08, 1946   Cancelled Treatment:    Reason Eval/Treat Not Completed: Patient at procedure or test/ unavailable. Order received, chart reviewed. Pt off unit for imaging, will re-attempt as pt available to initiate services.   Kathie Dike, M.S. OTR/L  02/12/23, 2:07 PM  ascom [phone]

## 2023-02-12 NOTE — Progress Notes (Signed)
Patient is still off the unit for a HIDA scan.  He was seen by Dr. Gala Romney today. I placed orders for a right and left cardiac catheterization and possible PCI to be done tomorrow in the early afternoon by Dr. Okey Dupre.

## 2023-02-13 ENCOUNTER — Telehealth (HOSPITAL_COMMUNITY): Payer: Self-pay | Admitting: Pharmacy Technician

## 2023-02-13 ENCOUNTER — Encounter: Payer: Self-pay | Admitting: Internal Medicine

## 2023-02-13 ENCOUNTER — Encounter
Admission: EM | Disposition: A | Discharge status: Other Acute Inpatient Hospital | Payer: Self-pay | Source: Home / Self Care | Attending: Internal Medicine

## 2023-02-13 ENCOUNTER — Other Ambulatory Visit (HOSPITAL_COMMUNITY): Payer: Self-pay

## 2023-02-13 DIAGNOSIS — I5021 Acute systolic (congestive) heart failure: Secondary | ICD-10-CM | POA: Diagnosis not present

## 2023-02-13 DIAGNOSIS — N179 Acute kidney failure, unspecified: Secondary | ICD-10-CM | POA: Insufficient documentation

## 2023-02-13 DIAGNOSIS — I5023 Acute on chronic systolic (congestive) heart failure: Secondary | ICD-10-CM | POA: Diagnosis not present

## 2023-02-13 DIAGNOSIS — I214 Non-ST elevation (NSTEMI) myocardial infarction: Secondary | ICD-10-CM | POA: Diagnosis not present

## 2023-02-13 DIAGNOSIS — I2510 Atherosclerotic heart disease of native coronary artery without angina pectoris: Secondary | ICD-10-CM | POA: Diagnosis not present

## 2023-02-13 HISTORY — PX: RIGHT/LEFT HEART CATH AND CORONARY ANGIOGRAPHY: CATH118266

## 2023-02-13 LAB — CBC
HCT: 36.9 % — ABNORMAL LOW (ref 39.0–52.0)
Hemoglobin: 12.1 g/dL — ABNORMAL LOW (ref 13.0–17.0)
MCH: 26.5 pg (ref 26.0–34.0)
MCHC: 32.8 g/dL (ref 30.0–36.0)
MCV: 80.7 fL (ref 80.0–100.0)
Platelets: 215 10*3/uL (ref 150–400)
RBC: 4.57 MIL/uL (ref 4.22–5.81)
RDW: 19.7 % — ABNORMAL HIGH (ref 11.5–15.5)
WBC: 5 10*3/uL (ref 4.0–10.5)
nRBC: 0 % (ref 0.0–0.2)

## 2023-02-13 LAB — POCT I-STAT 7, (LYTES, BLD GAS, ICA,H+H)
Acid-Base Excess: 1 mmol/L (ref 0.0–2.0)
Bicarbonate: 24.7 mmol/L (ref 20.0–28.0)
Calcium, Ion: 1.14 mmol/L — ABNORMAL LOW (ref 1.15–1.40)
HCT: 40 % (ref 39.0–52.0)
Hemoglobin: 13.6 g/dL (ref 13.0–17.0)
O2 Saturation: 95 %
Potassium: 3.7 mmol/L (ref 3.5–5.1)
Sodium: 138 mmol/L (ref 135–145)
TCO2: 26 mmol/L (ref 22–32)
pCO2 arterial: 36 mm[Hg] (ref 32–48)
pH, Arterial: 7.443 (ref 7.35–7.45)
pO2, Arterial: 74 mm[Hg] — ABNORMAL LOW (ref 83–108)

## 2023-02-13 LAB — BASIC METABOLIC PANEL
Anion gap: 10 (ref 5–15)
BUN: 27 mg/dL — ABNORMAL HIGH (ref 8–23)
CO2: 25 mmol/L (ref 22–32)
Calcium: 8.3 mg/dL — ABNORMAL LOW (ref 8.9–10.3)
Chloride: 102 mmol/L (ref 98–111)
Creatinine, Ser: 0.88 mg/dL (ref 0.61–1.24)
GFR, Estimated: >60 mL/min (ref >60)
Glucose, Bld: 75 mg/dL (ref 70–99)
Potassium: 3.8 mmol/L (ref 3.5–5.1)
Sodium: 137 mmol/L (ref 135–145)

## 2023-02-13 LAB — MAGNESIUM: Magnesium: 2.5 mg/dL — ABNORMAL HIGH (ref 1.7–2.4)

## 2023-02-13 LAB — HEPARIN LEVEL (UNFRACTIONATED): Heparin Unfractionated: 0.45 [IU]/mL (ref 0.30–0.70)

## 2023-02-13 SURGERY — RIGHT/LEFT HEART CATH AND CORONARY ANGIOGRAPHY
Anesthesia: Moderate Sedation

## 2023-02-13 MED ORDER — VERAPAMIL HCL 2.5 MG/ML IV SOLN
INTRAVENOUS | Status: DC | PRN
  Administered 2023-02-13 (×2): 2.5 mg via INTRA_ARTERIAL

## 2023-02-13 MED ORDER — FENTANYL CITRATE (PF) 100 MCG/2ML IJ SOLN
INTRAMUSCULAR | Status: DC | PRN
  Administered 2023-02-13: 12.5 ug via INTRAVENOUS

## 2023-02-13 MED ORDER — HEPARIN (PORCINE) 25000 UT/250ML-% IV SOLN
1250.0000 [IU]/h | INTRAVENOUS | Status: DC
  Administered 2023-02-14: 1250 [IU]/h via INTRAVENOUS
  Filled 2023-02-13: qty 250

## 2023-02-13 MED ORDER — MIDAZOLAM HCL 2 MG/2ML IJ SOLN
INTRAMUSCULAR | Status: DC | PRN
  Administered 2023-02-13: .5 mg via INTRAVENOUS

## 2023-02-13 MED ORDER — HEPARIN SODIUM (PORCINE) 1000 UNIT/ML IJ SOLN
INTRAMUSCULAR | Status: DC | PRN
  Administered 2023-02-13: 3000 [IU] via INTRAVENOUS

## 2023-02-13 MED ORDER — VERAPAMIL HCL 2.5 MG/ML IV SOLN
INTRAVENOUS | Status: AC
  Filled 2023-02-13: qty 2

## 2023-02-13 MED ORDER — SODIUM CHLORIDE 0.9% FLUSH
10.0000 mL | Freq: Two times a day (BID) | INTRAVENOUS | Status: DC
  Administered 2023-02-13: 10 mL via INTRAVENOUS

## 2023-02-13 MED ORDER — ASPIRIN 81 MG PO CHEW
81.0000 mg | CHEWABLE_TABLET | ORAL | Status: AC
Start: 2023-02-14 — End: 2023-02-14

## 2023-02-13 MED ORDER — ASPIRIN 81 MG PO CHEW: 81.0000 mg | CHEWABLE_TABLET | ORAL | Status: AC

## 2023-02-13 MED ORDER — ATORVASTATIN CALCIUM 80 MG PO TABS
80.0000 mg | ORAL_TABLET | Freq: Every day | ORAL | Status: DC
  Administered 2023-02-13 – 2023-02-14 (×2): 80 mg via ORAL
  Filled 2023-02-13 (×3): qty 1

## 2023-02-13 MED ORDER — MIDAZOLAM HCL 2 MG/2ML IJ SOLN
INTRAMUSCULAR | Status: AC
  Filled 2023-02-13: qty 2

## 2023-02-13 MED ORDER — SODIUM CHLORIDE 0.9% FLUSH: 3.0000 mL | INTRAVENOUS | Status: DC | PRN

## 2023-02-13 MED ORDER — SODIUM CHLORIDE 0.9 % IV SOLN: 250.0000 mL | INTRAVENOUS | Status: DC | PRN

## 2023-02-13 MED ORDER — HEPARIN SODIUM (PORCINE) 1000 UNIT/ML IJ SOLN
INTRAMUSCULAR | Status: AC
  Filled 2023-02-13: qty 10

## 2023-02-13 MED ORDER — HEPARIN (PORCINE) IN NACL 1000-0.9 UT/500ML-% IV SOLN
INTRAVENOUS | Status: DC | PRN
  Administered 2023-02-13: 1000 mL

## 2023-02-13 MED ORDER — IOHEXOL 300 MG/ML  SOLN
INTRAMUSCULAR | Status: DC | PRN
  Administered 2023-02-13: 27 mL

## 2023-02-13 MED ORDER — HYDRALAZINE HCL 20 MG/ML IJ SOLN
10.0000 mg | INTRAMUSCULAR | Status: AC | PRN
Start: 2023-02-13 — End: 2023-02-13

## 2023-02-13 MED ORDER — HEPARIN (PORCINE) IN NACL 1000-0.9 UT/500ML-% IV SOLN
INTRAVENOUS | Status: AC
  Filled 2023-02-13: qty 1000

## 2023-02-13 MED ORDER — SODIUM CHLORIDE 0.9% FLUSH
3.0000 mL | Freq: Two times a day (BID) | INTRAVENOUS | Status: DC
Start: 2023-02-13 — End: 2023-02-14
  Administered 2023-02-13 – 2023-02-14 (×2): 3 mL via INTRAVENOUS

## 2023-02-13 MED ORDER — FENTANYL CITRATE (PF) 100 MCG/2ML IJ SOLN
INTRAMUSCULAR | Status: AC
  Filled 2023-02-13: qty 2

## 2023-02-13 MED ORDER — POTASSIUM CHLORIDE CRYS ER 20 MEQ PO TBCR
20.0000 meq | EXTENDED_RELEASE_TABLET | Freq: Once | ORAL | Status: AC
  Administered 2023-02-13: 20 meq via ORAL
  Filled 2023-02-13: qty 1

## 2023-02-13 MED ORDER — LIDOCAINE HCL 1 % IJ SOLN
INTRAMUSCULAR | Status: DC | PRN
  Administered 2023-02-13 (×2): 1 mL

## 2023-02-13 SURGICAL SUPPLY — 13 items
CATH 5FR JL3.5 JR4 ANG PIG MP (CATHETERS) IMPLANT
CATH BALLN WEDGE 5F 110CM (CATHETERS) IMPLANT
DEVICE RAD TR BAND REGULAR (VASCULAR PRODUCTS) IMPLANT
DRAPE BRACHIAL (DRAPES) IMPLANT
GLIDESHEATH SLEND SS 6F .021 (SHEATH) IMPLANT
GUIDEWIRE INQWIRE 1.5J.035X260 (WIRE) IMPLANT
INQWIRE 1.5J .035X260CM (WIRE) ×1
PACK CARDIAC CATH (CUSTOM PROCEDURE TRAY) ×1 IMPLANT
PAD ELECT DEFIB RADIOL ZOLL (MISCELLANEOUS) IMPLANT
PROTECTION STATION PRESSURIZED (MISCELLANEOUS) ×1
SET ATX-X65L (MISCELLANEOUS) IMPLANT
SHEATH GLIDE SLENDER 4/5FR (SHEATH) IMPLANT
STATION PROTECTION PRESSURIZED (MISCELLANEOUS) IMPLANT

## 2023-02-13 NOTE — Interval H&P Note (Signed)
History and Physical Interval Note:  02/13/2023 12:25 PM  Walter Vargas  has presented today for surgery, with the diagnosis of NSTEMI/ acute systolic heart failure.  The various methods of treatment have been discussed with the patient and family. After consideration of risks, benefits and other options for treatment, the patient has consented to  Procedure(s): RIGHT AND LEFT HEART CATH (N/A) as a surgical intervention.  The patient's history has been reviewed, patient examined, no change in status, stable for surgery.  I have reviewed the patient's chart and labs.  Questions were answered to the patient's satisfaction.    Cath Lab Visit (complete for each Cath Lab visit)  Clinical Evaluation Leading to the Procedure:   ACS: Yes.    Non-ACS:  N/A  Shelba Susi

## 2023-02-13 NOTE — H&P (View-Only) (Signed)
Advanced Heart Failure Rounding Note  PCP-Cardiologist: None   Subjective:    Denies chest pain. Denies shortness of breath.    Objective:   Weight Range: 63.7 kg Body mass index is 21.36 kg/m.   Vital Signs:   Temp:  [97.7 F (36.5 C)-98.2 F (36.8 C)] 97.7 F (36.5 C) (12/03 0813) Pulse Rate:  [67-76] 76 (12/03 0813) Resp:  [16-20] 16 (12/03 0813) BP: (93-122)/(58-105) 103/72 (12/03 0813) SpO2:  [95 %-100 %] 100 % (12/03 0813) Last BM Date : 02/11/23  Weight change: Filed Weights   02/10/23 2100  Weight: 63.7 kg    Intake/Output:   Intake/Output Summary (Last 24 hours) at 02/13/2023 0918 Last data filed at 02/13/2023 0200 Gross per 24 hour  Intake 210.08 ml  Output 1300 ml  Net -1089.92 ml      Physical Exam    General:  In bed.  No resp difficulty HEENT: Normal Neck: Supple. JVP flat . Carotids 2+ bilat; no bruits. No lymphadenopathy or thyromegaly appreciated. Cor: PMI nondisplaced. Regular rate & rhythm. No rubs, gallops or murmurs. Lungs: Clear Abdomen: Soft, nontender, nondistended. No hepatosplenomegaly. No bruits or masses. Good bowel sounds. Extremities: No cyanosis, clubbing, rash, edema Neuro: Alert & orientedx3, cranial nerves grossly intact. moves all 4 extremities w/o difficulty. Affect pleasant   Telemetry   SR 60-70s   EKG    N/A  Labs    CBC Recent Labs    02/12/23 0501 02/13/23 0415  WBC 5.3 5.0  HGB 11.6* 12.1*  HCT 35.3* 36.9*  MCV 81.3 80.7  PLT 198 215   Basic Metabolic Panel Recent Labs    13/24/40 0501 02/13/23 0415  NA 136 137  K 3.4* 3.8  CL 102 102  CO2 26 25  GLUCOSE 67* 75  BUN 26* 27*  CREATININE 0.87 0.88  CALCIUM 8.0* 8.3*  MG 1.9 2.5*   Liver Function Tests Recent Labs    02/10/23 1322 02/11/23 0546  AST 124* 193*  ALT 46* 106*  ALKPHOS 58 52  BILITOT 2.1* 1.4*  PROT 6.6 5.8*  ALBUMIN 3.4* 3.2*   Recent Labs    02/10/23 1322  LIPASE 22   Cardiac Enzymes No results for  input(s): "CKTOTAL", "CKMB", "CKMBINDEX", "TROPONINI" in the last 72 hours.  BNP: BNP (last 3 results) Recent Labs    02/10/23 1120  BNP >4,500.0*    ProBNP (last 3 results) Recent Labs    10/12/22 1030 10/19/22 0852 01/08/23 0957  PROBNP 2,878.0* 2,297.0* >4978.0*     D-Dimer No results for input(s): "DDIMER" in the last 72 hours. Hemoglobin A1C No results for input(s): "HGBA1C" in the last 72 hours. Fasting Lipid Panel Recent Labs    02/11/23 0546  CHOL 90  HDL 38*  LDLCALC 43  TRIG 45  CHOLHDL 2.4   Thyroid Function Tests Recent Labs    02/11/23 0545  TSH 4.481    Other results:   Imaging    NM Hepato W/EF  Result Date: 02/12/2023 CLINICAL DATA:  Cholelithiasis.  Concern for cholecystitis. EXAM: NUCLEAR MEDICINE HEPATOBILIARY IMAGING WITH GALLBLADDER EF TECHNIQUE: Sequential images of the abdomen were obtained out to 60 minutes following intravenous administration of radiopharmaceutical. After oral ingestion of Ensure, gallbladder ejection fraction was determined. At 60 min, normal ejection fraction is greater than 33%. RADIOPHARMACEUTICALS:  5.6 mCi Tc-23m  Choletec IV COMPARISON:  Ultrasound 02/10/2023 FINDINGS: Prompt uptake and biliary excretion of activity by the liver is seen. Gallbladder activity is visualized, consistent with  patency of cystic duct. Biliary activity passes into small bowel, consistent with patent common bile duct. Fatty meal was administered to augment contraction of the gallbladder. Minimal contraction of the gallbladder with fatty meal stimulation. Calculated gallbladder ejection fraction is 0%. (Normal gallbladder ejection fraction with Ensure is greater than 33% and less than 80%.) IMPRESSION: 1. Patent cystic duct and common bile duct. No evidence of acute cholecystitis. 2. Failure of the gallbladder to contracted after administration of fatty meal. Electronically Signed   By: Genevive Bi M.D.   On: 02/12/2023 17:06      Medications:     Scheduled Medications:  aspirin EC  81 mg Oral Daily   furosemide  60 mg Intravenous BID   spironolactone  12.5 mg Oral Daily    Infusions:  heparin 1,250 Units/hr (02/12/23 2132)    PRN Medications: acetaminophen **OR** acetaminophen, ondansetron **OR** ondansetron (ZOFRAN) IV, senna-docusate    Patient Profile   Walter Vargas is a 77 year old with a history of HTN, Walter, and recently diagnosed HFrEF.     Admitted with A/C biventricular heart failure & NSTEMI  Assessment/Plan   1. A/C HFrEF, Biventricular  -Recently diagnosed 01/2023 with  global HK,severely reduced LVEF, moderately reduced RV, and mod-severe Walter. CO2 26. No previous cath. HS Trop >13K.  BNP > 4500.  - Diuresed with IV lasix. Appear euvolemic.Stop lasix.  Adjust post cath.  - Plan for cath today. He is agreeable. If cath ok may need CMRI to further assess.  -Continue spiro 12.5 mg daily.  - Adjust GDMT post cath.  - Renal function ok.    2. NSTEMI  -Had chest tightness prior to admit. HS Trop 13K.  -On heparin drip.  - Plan cath RHC/LHC today by Dr End. He is agreeable.    3. Pleural Effusion Small Right and Mod Left . Follow with diuresis.    4. Gall Bladder Wall Thickening HIDDA scan- no evidence of cholecystitis.     5. Elevated LFTs AST 124>193 ALT 46>106    6. Mod/Severe Walter May need TEE to further assess.    Length of Stay: 3  Walter Clegg, NP  02/13/2023, 9:18 AM  Advanced Heart Failure Team Pager [phone] (M-F; 7a - 5p)  Please contact [facility] for night-coverage after hours (5p -7a ) and weekends on [email or web address]  Patient seen and examined with the above-signed Advanced Practice Provider and/or Housestaff. I personally reviewed laboratory data, imaging studies and relevant notes. I independently examined the patient and formulated the important aspects of the plan. I have edited the note to reflect any of my changes or salient points. I have personally discussed  the plan with the patient and/or family.  Denies CP or SOB. Remains on heparin for NSTEMI.  Plan for cath today   General:  Thin male sitting in chair  No resp difficulty HEENT: normal Neck: supple. no JVD. Carotids 2+ bilat; no bruits. No lymphadenopathy or thryomegaly appreciated. Cor: PMI nondisplaced. Regular rate & rhythm. 2/6 TR Lungs: clear Abdomen: soft, nontender, nondistended. No hepatosplenomegaly. No bruits or masses. Good bowel sounds. Extremities: no cyanosis, clubbing, rash, edema Neuro: alert & orientedx3, cranial nerves grossly intact. moves all 4 extremities w/o difficulty. Affect pleasant  For R/L cath today with Dr. Okey Dupre. 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 Further evaluation of Walter based on cath findings (ischemic vs nonischemic)   Walter Meres, MD  10:31 AM

## 2023-02-13 NOTE — Progress Notes (Signed)
OT Cancellation Note  Patient Details Name: Walter Vargas MRN: [id number] DOB: 11/01/1945   Cancelled Treatment:     Consult received and chart reviewed. PT  discussed with RN who states pt is asking to talk to MD about heart cath today. PT discussed with MD, advised to hold off on PT/OT evaluations this date and re-attempt after procedure.   [name] 02/13/2023, 9:59 AM

## 2023-02-13 NOTE — Progress Notes (Signed)
PT Cancellation Note  Patient Details Name: Walter Vargas MRN: [id number] DOB: [birth year]   Cancelled Treatment:    Reason Eval/Treat Not Completed: Other (comment). Consult received and chart reviewed. Discussed with RN who states pt is asking to talk to MD about heart cath today. Discussed with MD, advised to hold off on PT evaluation this date and re-attempt after procedure.    Khori Underberg 02/13/2023, 9:28 AM Elizabeth Palau, PT, DPT, GCS [phone]

## 2023-02-13 NOTE — Telephone Encounter (Signed)
Patient Product/process development scientist completed.    The patient is insured through [facility] [employer]. Patient has Medicare and is not eligible for a copay card, but may be able to apply for patient assistance, if available.    Ran test claim for Entresto 24-26 mg and the current 30 day co-pay is $0.00.  Ran test claim for Farxiga 10 mg and the current 30 day co-pay is $0.00.  Ran test claim for Jardiance 10 mg and the current 30 day co-pay is $0.00.  Ran test claim for Brilinta 90 mg and the current 30 day co-pay is $0.00.  This test claim was processed through [facility]- copay amounts may vary at other pharmacies due to pharmacy/plan contracts, or as the patient moves through the different stages of their insurance plan.     Walter Vargas, CPHT Pharmacy Technician III Certified Patient Advocate [facility] Patient Advocate Team Direct Number: [phone]  Fax: [phone]

## 2023-02-13 NOTE — Plan of Care (Signed)

## 2023-02-13 NOTE — Progress Notes (Addendum)
Advanced Heart Failure Rounding Note  PCP-Cardiologist: None   Subjective:    Denies chest pain. Denies shortness of breath.    Objective:   Weight Range: 63.7 kg Body mass index is 21.36 kg/m.   Vital Signs:   Temp:  [97.7 F (36.5 C)-98.2 F (36.8 C)] 97.7 F (36.5 C) (12/03 0813) Pulse Rate:  [67-76] 76 (12/03 0813) Resp:  [16-20] 16 (12/03 0813) BP: (93-122)/(58-105) 103/72 (12/03 0813) SpO2:  [95 %-100 %] 100 % (12/03 0813) Last BM Date : 02/11/23  Weight change: Filed Weights   02/10/23 2100  Weight: 63.7 kg    Intake/Output:   Intake/Output Summary (Last 24 hours) at 02/13/2023 0918 Last data filed at 02/13/2023 0200 Gross per 24 hour  Intake 210.08 ml  Output 1300 ml  Net -1089.92 ml      Physical Exam    General:  In bed.  No resp difficulty HEENT: Normal Neck: Supple. JVP flat . Carotids 2+ bilat; no bruits. No lymphadenopathy or thyromegaly appreciated. Cor: PMI nondisplaced. Regular rate & rhythm. No rubs, gallops or murmurs. Lungs: Clear Abdomen: Soft, nontender, nondistended. No hepatosplenomegaly. No bruits or masses. Good bowel sounds. Extremities: No cyanosis, clubbing, rash, edema Neuro: Alert & orientedx3, cranial nerves grossly intact. moves all 4 extremities w/o difficulty. Affect pleasant   Telemetry   SR 60-70s   EKG    N/A  Labs    CBC Recent Labs    02/12/23 0501 02/13/23 0415  WBC 5.3 5.0  HGB 11.6* 12.1*  HCT 35.3* 36.9*  MCV 81.3 80.7  PLT 198 215   Basic Metabolic Panel Recent Labs    13/24/40 0501 02/13/23 0415  NA 136 137  K 3.4* 3.8  CL 102 102  CO2 26 25  GLUCOSE 67* 75  BUN 26* 27*  CREATININE 0.87 0.88  CALCIUM 8.0* 8.3*  MG 1.9 2.5*   Liver Function Tests Recent Labs    02/10/23 1322 02/11/23 0546  AST 124* 193*  ALT 46* 106*  ALKPHOS 58 52  BILITOT 2.1* 1.4*  PROT 6.6 5.8*  ALBUMIN 3.4* 3.2*   Recent Labs    02/10/23 1322  LIPASE 22   Cardiac Enzymes No results for  input(s): "CKTOTAL", "CKMB", "CKMBINDEX", "TROPONINI" in the last 72 hours.  BNP: BNP (last 3 results) Recent Labs    02/10/23 1120  BNP >4,500.0*    ProBNP (last 3 results) Recent Labs    10/12/22 1030 10/19/22 0852 01/08/23 0957  PROBNP 2,878.0* 2,297.0* >4978.0*     D-Dimer No results for input(s): "DDIMER" in the last 72 hours. Hemoglobin A1C No results for input(s): "HGBA1C" in the last 72 hours. Fasting Lipid Panel Recent Labs    02/11/23 0546  CHOL 90  HDL 38*  LDLCALC 43  TRIG 45  CHOLHDL 2.4   Thyroid Function Tests Recent Labs    02/11/23 0545  TSH 4.481    Other results:   Imaging    NM Hepato W/EF  Result Date: 02/12/2023 CLINICAL DATA:  Cholelithiasis.  Concern for cholecystitis. EXAM: NUCLEAR MEDICINE HEPATOBILIARY IMAGING WITH GALLBLADDER EF TECHNIQUE: Sequential images of the abdomen were obtained out to 60 minutes following intravenous administration of radiopharmaceutical. After oral ingestion of Ensure, gallbladder ejection fraction was determined. At 60 min, normal ejection fraction is greater than 33%. RADIOPHARMACEUTICALS:  5.6 mCi Tc-23m  Choletec IV COMPARISON:  Ultrasound 02/10/2023 FINDINGS: Prompt uptake and biliary excretion of activity by the liver is seen. Gallbladder activity is visualized, consistent with  patency of cystic duct. Biliary activity passes into small bowel, consistent with patent common bile duct. Fatty meal was administered to augment contraction of the gallbladder. Minimal contraction of the gallbladder with fatty meal stimulation. Calculated gallbladder ejection fraction is 0%. (Normal gallbladder ejection fraction with Ensure is greater than 33% and less than 80%.) IMPRESSION: 1. Patent cystic duct and common bile duct. No evidence of acute cholecystitis. 2. Failure of the gallbladder to contracted after administration of fatty meal. Electronically Signed   By: Genevive Bi M.D.   On: 02/12/2023 17:06      Medications:     Scheduled Medications:  aspirin EC  81 mg Oral Daily   furosemide  60 mg Intravenous BID   spironolactone  12.5 mg Oral Daily    Infusions:  heparin 1,250 Units/hr (02/12/23 2132)    PRN Medications: acetaminophen **OR** acetaminophen, ondansetron **OR** ondansetron (ZOFRAN) IV, senna-docusate    Patient Profile   Mr Strenger is a 77 year old with a history of HTN, MR, and recently diagnosed HFrEF.     Admitted with A/C biventricular heart failure & NSTEMI  Assessment/Plan   1. A/C HFrEF, Biventricular  -Recently diagnosed 01/2023 with  global HK,severely reduced LVEF, moderately reduced RV, and mod-severe MR. CO2 26. No previous cath. HS Trop >13K.  BNP > 4500.  - Diuresed with IV lasix. Appear euvolemic.Stop lasix.  Adjust post cath.  - Plan for cath today. He is agreeable. If cath ok may need CMRI to further assess.  -Continue spiro 12.5 mg daily.  - Adjust GDMT post cath.  - Renal function ok.    2. NSTEMI  -Had chest tightness prior to admit. HS Trop 13K.  -On heparin drip.  - Plan cath RHC/LHC today by Dr End. He is agreeable.    3. Pleural Effusion Small Right and Mod Left . Follow with diuresis.    4. Gall Bladder Wall Thickening HIDDA scan- no evidence of cholecystitis.     5. Elevated LFTs AST 124>193 ALT 46>106    6. Mod/Severe MR May need TEE to further assess.    Length of Stay: 3  Amy Clegg, NP  02/13/2023, 9:18 AM  Advanced Heart Failure Team Pager [phone] (M-F; 7a - 5p)  Please contact [facility] for night-coverage after hours (5p -7a ) and weekends on [email or web address]  Patient seen and examined with the above-signed Advanced Practice Provider and/or Housestaff. I personally reviewed laboratory data, imaging studies and relevant notes. I independently examined the patient and formulated the important aspects of the plan. I have edited the note to reflect any of my changes or salient points. I have personally discussed  the plan with the patient and/or family.  Denies CP or SOB. Remains on heparin for NSTEMI.  Plan for cath today   General:  Thin male sitting in chair  No resp difficulty HEENT: normal Neck: supple. no JVD. Carotids 2+ bilat; no bruits. No lymphadenopathy or thryomegaly appreciated. Cor: PMI nondisplaced. Regular rate & rhythm. 2/6 TR Lungs: clear Abdomen: soft, nontender, nondistended. No hepatosplenomegaly. No bruits or masses. Good bowel sounds. Extremities: no cyanosis, clubbing, rash, edema Neuro: alert & orientedx3, cranial nerves grossly intact. moves all 4 extremities w/o difficulty. Affect pleasant  For R/L cath today with Dr. Okey Dupre. 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 Further evaluation of MR based on cath findings (ischemic vs nonischemic)   Arvilla Meres, MD  10:31 AM

## 2023-02-13 NOTE — Consult Note (Signed)
PHARMACY - ANTICOAGULATION CONSULT NOTE  Pharmacy Consult for heparin  Indication: chest pain/ACS  No Known Allergies  Patient Measurements: Height: 5\' 7"  (170.2 cm) Weight: 63.7 kg (140 lb 6.9 oz) IBW/kg (Calculated) : 66.1 Heparin Dosing Weight: 63.7 kg  Vital Signs: Temp: 97.4 F (36.3 C) (12/03 1555) Temp Source: Oral (12/03 1144) BP: 94/63 (12/03 1555) Pulse Rate: 65 (12/03 1555)  Labs: Recent Labs    02/11/23 0546 02/11/23 1512 02/12/23 0501 02/12/23 1020 02/12/23 1738 02/13/23 0415 02/13/23 1327  HGB 11.6*  --  11.6*  --   --  12.1* 13.6  HCT 35.3*  --  35.3*  --   --  36.9* 40.0  PLT 176  --  198  --   --  215  --   HEPARINUNFRC 0.12*   < >  --  0.35 0.43 0.45  --   CREATININE 1.20  --  0.87  --   --  0.88  --    < > = values in this interval not displayed.   Estimated Creatinine Clearance: 63.3 mL/min (by C-G formula based on SCr of 0.88 mg/dL).  Medical History: Past Medical History:  Diagnosis Date   Essential hypertension    HFrEF (heart failure with reduced ejection fraction) (HCC)    Mitral regurgitation    Medications:  Not on anticoagulation PTA  Assessment: Patient is a 77 y.o. male who presented to the ED on 02/10/2023 with SOB and chest pain. PMH includes HFrEF with EF < 20 RV mod reduced and mildly enlarged, mod pulm artery pressure, LA mod dilated, RA mildly dilated, mod pleural effusion, severe MVR. On admission BNP > 4,500 and troponins 13,606. 12/3 taken for Right/Left heart cath and coronary angiography. Post procedure recommends CABG +/- MVR. Patient wishes to discuss with patient before transfer. Planned to resume heparin infusion 2 hours after TR band removal. Pharmacy was consulted to dose and manage heparin.   Date/Time  HL Rate  Comment 11/30 2050 0.16 750 units/hr Subtherapeutic 12/01 0546 0.12 950 units/hr Subtherapeutic 12/01 1512 0.17 1050 units/hr Subtherapeutic 12/02 0023 <0.1 1250 units/hr Subtherapeutic 12/02  1020 0.35 1250 units/hr Therapeutic x1 12/02 1738 0.43 1250 units/hr  Therapeutic x2 12/03 0415      0.45    1250 units/hr   Therapeutic X 3  12/03 - heparin stopped for cath, started pending transfer for CABG  Goal of Therapy:  Heparin level 0.3-0.7 units/ml Monitor platelets by anticoagulation protocol: Yes   Plan:  Re-started heparin infusion at previously therapeutic rate: 1250 units/hour Band removed at 1500 Timed to start 2 hours after remove: 1700 Monitor Anti-Xa levels 8 hours after heparin infusion starts (planned to start at 1700) Monitor CBC and signs/symptoms of bleeding  Thank you for involving pharmacy in this patient's care.   Effie Shy, PharmD Pharmacy Resident  02/13/2023 4:14 PM

## 2023-02-13 NOTE — Consult Note (Signed)
PHARMACY - ANTICOAGULATION CONSULT NOTE  Pharmacy Consult for heparin  Indication: chest pain/ACS  No Known Allergies  Patient Measurements: Height: 5' 7.99" (172.7 cm) Weight: 63.7 kg (140 lb 6.9 oz) IBW/kg (Calculated) : 68.38 Heparin Dosing Weight: 63.7 kg  Vital Signs: Temp: 97.9 F (36.6 C) (12/03 0417) BP: 111/75 (12/03 0417) Pulse Rate: 70 (12/03 0417)  Labs: Recent Labs    02/10/23 1120 02/10/23 1225 02/10/23 2050 02/11/23 0546 02/11/23 1512 02/12/23 0501 02/12/23 1020 02/12/23 1738 02/13/23 0415  HGB 12.6*  --   --  11.6*  --  11.6*  --   --  12.1*  HCT 41.8  --   --  35.3*  --  35.3*  --   --  36.9*  PLT 237  --   --  176  --  198  --   --  215  APTT  --  34  --   --   --   --   --   --   --   LABPROT  --  17.5*  --   --   --   --   --   --   --   INR  --  1.4*  --   --   --   --   --   --   --   HEPARINUNFRC  --   --    < > 0.12*   < >  --  0.35 0.43 0.45  CREATININE 1.37*  --   --  1.20  --  0.87  --   --  0.88  TROPONINIHS 13,606* 52,841*  --   --   --   --   --   --   --    < > = values in this interval not displayed.    Estimated Creatinine Clearance: 63.3 mL/min (by C-G formula based on SCr of 0.88 mg/dL).   Medical History: Past Medical History:  Diagnosis Date   Essential hypertension    HFrEF (heart failure with reduced ejection fraction) (HCC)    Mitral regurgitation     Medications:  Not on anticoagulation PTA  Assessment: Patient is a 77 y.o. male who presented to the ED on 02/10/2023 with SOB and chest pain. PMH includes HFrEF with EF < 20 RV mod reduced and mildly enlarged, mod pulm artery pressure, LA mod dilated, RA mildly dilated, mod pleural effusion, severe MVR. On admission BNP > 4,500 and troponins 13,606.   Date/Time  HL Rate  Comment 11/30 2050 0.16 750 units/hr Subtherapeutic 12/01 0546 0.12 950 units/hr Subtherapeutic 12/01 1512 0.17 1050 units/hr Subtherapeutic 12/02 0023 <0.1 1250 units/hr Subtherapeutic 12/02  1020 0.35 1250 units/hr Therapeutic x1 12/02 1738 0.43 1250 units/hr  Therapeutic x2 12/03 0415      0.45    1250 units/hr   Therapeutic X 3   Goal of Therapy:  Heparin level 0.3-0.7 units/ml Monitor platelets by anticoagulation protocol: Yes   Plan:  12/3:  HL @ 0415 = 0.45, therapeutic X 3 Continue heparin infusion at 1250 units/hour Monitor daily Anti-Xa levels while on heparin infusion Monitor CBC and signs/symptoms of bleeding F/u plans for left and right heart cath on 02/13/23  Thank you for involving pharmacy in this patient's care.   Daniella Dewberry D Clinical Pharmacist 02/13/2023 6:18 AM

## 2023-02-13 NOTE — Progress Notes (Signed)
Progress Note   Patient: Walter Vargas EXB:[id number] DOB: 30-Nov-1945 DOA: 02/10/2023     3 DOS: the patient was seen and examined on 02/13/2023   Brief hospital course: Mr. Jadakiss Kump is a 77 year old male with history of hypertension, heart failure reduced ejection fraction, who presents emergency department for chief concerns of chest pain, shortness of breath, and left-sided abdominal pain. Patient recently had echocardiogram 01/16/2023 showed ejection fraction less than 20% with moderate to severe mitral regurgitation.  Lab work showed BNP was elevated > 4500.  High sensitive troponin was 13,606 and on repeat was 13,871. CT abdomen pelvis with contrast: Was read as moderate gallbladder wall thickening is noted without cholelithiasis.  Ultrasound or HIDA scan recommended to evaluate acalculous cholecystitis.  Moderate bilateral pleural effusion. Patient is seen by cardiology, started on heparin drip and IV Lasix.  HIDA scan was also ordered.  No evidence of cholecystitis. Coronary angiogram was performed on 12/3, showed severe three-vessel disease, cardiology recommended CABG.   Principal Problem:   Acute exacerbation of CHF (congestive heart failure) (HCC) Active Problems:   NSTEMI (non-ST elevated myocardial infarction) (HCC)   Thickening of wall of gallbladder   Elevated LFTs   Dyspnea on exertion   Pleural effusion   Elevated brain natriuretic peptide (BNP) level   Assessment and Plan: Acute on chronic systolic congestive heart failure. Moderate to severe mitral regurgitation. Bilateral pleural effusion secondary to CHF. Patient had severe systolic dysfunction with ejection fraction less than 20%.  Patient was volume overloaded at the time of admission, patient is still on IV Lasix and followed by cardiology.   Patient is a followed by cardiology to consider GDMT. Heart cath was performed today.  Showed severe three-vessel disease.   NSTEMI (non-ST elevated myocardial  infarction) (HCC) Heart cath showed severe three-vessel disease, need CABG.  Patient is undecided as for the surgery.  Patient is put back on heparin postop.    Elevated LFTs Thickening of wall of gallbladder CT scan showed thickened wall of the gallbladder, right upper quadrant ultrasound also showed thickened bladder wall with edema, but clinically, patient does not have any evidence of infection.  HIDA scan has ruled out cholecystitis.  Acute kidney injury. Hyponatremia. Mild metabolic acidosis. Hypokalemia. Condition stable, renal function normalized. Potassium also normalized.      Subjective:  Patient doing well today, no chest pain shortness of breath.  Physical Exam: Vitals:   02/13/23 1500 02/13/23 1515 02/13/23 1530 02/13/23 1555  BP: 101/62 (!) 81/60 90/62 94/63   Pulse: 70 69 65 65  Resp: (!) 21 13 (!) 21 16  Temp:    (!) 97.4 F (36.3 C)  TempSrc:      SpO2: 93% 90% 93% 99%  Weight:      Height:       General exam: Appears calm and comfortable  Respiratory system: Clear to auscultation. Respiratory effort normal. Cardiovascular system: S1 & S2 heard, RRR. No JVD, murmurs, rubs, gallops or clicks. No pedal edema. Gastrointestinal system: Abdomen is nondistended, soft and nontender. No organomegaly or masses felt. Normal bowel sounds heard. Central nervous system: Alert and oriented. No focal neurological deficits. Extremities: Symmetric 5 x 5 power. Skin: No rashes, lesions or ulcers Psychiatry: Judgement and insight appear normal. Mood & affect appropriate.    Data Reviewed:  Lab results reviewed.  Family Communication:   Disposition: Status is: Inpatient Remains inpatient appropriate because: Severity of disease, IV treatment.  Inpatient procedure.     Time spent: 35 minutes  Author: Marrion Coy, MD 02/13/2023 4:25 PM  For on call review [email or web address].

## 2023-02-14 ENCOUNTER — Encounter (HOSPITAL_COMMUNITY): Payer: Self-pay

## 2023-02-14 ENCOUNTER — Other Ambulatory Visit (HOSPITAL_COMMUNITY): Payer: 59

## 2023-02-14 ENCOUNTER — Inpatient Hospital Stay (HOSPITAL_COMMUNITY): Payer: 59

## 2023-02-14 ENCOUNTER — Encounter (HOSPITAL_COMMUNITY): Payer: Self-pay | Admitting: Student in an Organized Health Care Education/Training Program

## 2023-02-14 ENCOUNTER — Other Ambulatory Visit: Payer: Self-pay

## 2023-02-14 ENCOUNTER — Inpatient Hospital Stay (HOSPITAL_COMMUNITY)
Admission: AD | Admit: 2023-02-14 | Discharge: 2023-03-20 | DRG: 001 | Disposition: A | Discharge status: Rehabilitation Facility | Payer: 59 | Source: Other Acute Inpatient Hospital | Attending: Thoracic Surgery (Cardiothoracic Vascular Surgery) | Admitting: Thoracic Surgery (Cardiothoracic Vascular Surgery)

## 2023-02-14 DIAGNOSIS — K429 Umbilical hernia without obstruction or gangrene: Secondary | ICD-10-CM | POA: Diagnosis present

## 2023-02-14 DIAGNOSIS — L89152 Pressure ulcer of sacral region, stage 2: Secondary | ICD-10-CM | POA: Diagnosis not present

## 2023-02-14 DIAGNOSIS — Z9861 Coronary angioplasty status: Secondary | ICD-10-CM | POA: Diagnosis not present

## 2023-02-14 DIAGNOSIS — Z79899 Other long term (current) drug therapy: Secondary | ICD-10-CM

## 2023-02-14 DIAGNOSIS — Z681 Body mass index (BMI) 19 or less, adult: Secondary | ICD-10-CM

## 2023-02-14 DIAGNOSIS — L03115 Cellulitis of right lower limb: Secondary | ICD-10-CM | POA: Diagnosis not present

## 2023-02-14 DIAGNOSIS — I272 Pulmonary hypertension, unspecified: Secondary | ICD-10-CM | POA: Diagnosis present

## 2023-02-14 DIAGNOSIS — J9601 Acute respiratory failure with hypoxia: Secondary | ICD-10-CM | POA: Diagnosis not present

## 2023-02-14 DIAGNOSIS — I5041 Acute combined systolic (congestive) and diastolic (congestive) heart failure: Secondary | ICD-10-CM | POA: Diagnosis not present

## 2023-02-14 DIAGNOSIS — Z87891 Personal history of nicotine dependence: Secondary | ICD-10-CM

## 2023-02-14 DIAGNOSIS — I5021 Acute systolic (congestive) heart failure: Secondary | ICD-10-CM | POA: Diagnosis not present

## 2023-02-14 DIAGNOSIS — I2510 Atherosclerotic heart disease of native coronary artery without angina pectoris: Secondary | ICD-10-CM

## 2023-02-14 DIAGNOSIS — E871 Hypo-osmolality and hyponatremia: Secondary | ICD-10-CM | POA: Diagnosis not present

## 2023-02-14 DIAGNOSIS — I429 Cardiomyopathy, unspecified: Secondary | ICD-10-CM | POA: Diagnosis not present

## 2023-02-14 DIAGNOSIS — I081 Rheumatic disorders of both mitral and tricuspid valves: Secondary | ICD-10-CM | POA: Diagnosis present

## 2023-02-14 DIAGNOSIS — I5042 Chronic combined systolic (congestive) and diastolic (congestive) heart failure: Secondary | ICD-10-CM

## 2023-02-14 DIAGNOSIS — E8729 Other acidosis: Secondary | ICD-10-CM | POA: Diagnosis not present

## 2023-02-14 DIAGNOSIS — J690 Pneumonitis due to inhalation of food and vomit: Secondary | ICD-10-CM | POA: Diagnosis not present

## 2023-02-14 DIAGNOSIS — I472 Ventricular tachycardia, unspecified: Secondary | ICD-10-CM | POA: Diagnosis not present

## 2023-02-14 DIAGNOSIS — I110 Hypertensive heart disease with heart failure: Secondary | ICD-10-CM | POA: Diagnosis not present

## 2023-02-14 DIAGNOSIS — S51012A Laceration without foreign body of left elbow, initial encounter: Secondary | ICD-10-CM | POA: Diagnosis not present

## 2023-02-14 DIAGNOSIS — I255 Ischemic cardiomyopathy: Secondary | ICD-10-CM | POA: Diagnosis not present

## 2023-02-14 DIAGNOSIS — I97611 Postprocedural hemorrhage and hematoma of a circulatory system organ or structure following cardiac bypass: Secondary | ICD-10-CM | POA: Diagnosis not present

## 2023-02-14 DIAGNOSIS — U071 COVID-19: Secondary | ICD-10-CM | POA: Diagnosis not present

## 2023-02-14 DIAGNOSIS — Y92230 Patient room in hospital as the place of occurrence of the external cause: Secondary | ICD-10-CM | POA: Diagnosis not present

## 2023-02-14 DIAGNOSIS — F0280 Dementia in other diseases classified elsewhere without behavioral disturbance: Secondary | ICD-10-CM | POA: Diagnosis not present

## 2023-02-14 DIAGNOSIS — Z833 Family history of diabetes mellitus: Secondary | ICD-10-CM

## 2023-02-14 DIAGNOSIS — L89156 Pressure-induced deep tissue damage of sacral region: Secondary | ICD-10-CM | POA: Diagnosis not present

## 2023-02-14 DIAGNOSIS — E038 Other specified hypothyroidism: Secondary | ICD-10-CM | POA: Diagnosis not present

## 2023-02-14 DIAGNOSIS — Z951 Presence of aortocoronary bypass graft: Secondary | ICD-10-CM

## 2023-02-14 DIAGNOSIS — L8990 Pressure ulcer of unspecified site, unspecified stage: Secondary | ICD-10-CM | POA: Diagnosis not present

## 2023-02-14 DIAGNOSIS — T8141XA Infection following a procedure, superficial incisional surgical site, initial encounter: Secondary | ICD-10-CM | POA: Diagnosis not present

## 2023-02-14 DIAGNOSIS — I5023 Acute on chronic systolic (congestive) heart failure: Secondary | ICD-10-CM | POA: Diagnosis present

## 2023-02-14 DIAGNOSIS — N179 Acute kidney failure, unspecified: Secondary | ICD-10-CM

## 2023-02-14 DIAGNOSIS — K828 Other specified diseases of gallbladder: Secondary | ICD-10-CM | POA: Diagnosis present

## 2023-02-14 DIAGNOSIS — Z95811 Presence of heart assist device: Secondary | ICD-10-CM | POA: Diagnosis not present

## 2023-02-14 DIAGNOSIS — I5022 Chronic systolic (congestive) heart failure: Secondary | ICD-10-CM | POA: Diagnosis not present

## 2023-02-14 DIAGNOSIS — I214 Non-ST elevation (NSTEMI) myocardial infarction: Secondary | ICD-10-CM | POA: Diagnosis not present

## 2023-02-14 DIAGNOSIS — Z452 Encounter for adjustment and management of vascular access device: Secondary | ICD-10-CM | POA: Diagnosis not present

## 2023-02-14 DIAGNOSIS — R64 Cachexia: Secondary | ICD-10-CM | POA: Diagnosis not present

## 2023-02-14 DIAGNOSIS — K8020 Calculus of gallbladder without cholecystitis without obstruction: Secondary | ICD-10-CM | POA: Diagnosis present

## 2023-02-14 DIAGNOSIS — I6522 Occlusion and stenosis of left carotid artery: Secondary | ICD-10-CM | POA: Diagnosis not present

## 2023-02-14 DIAGNOSIS — F0394 Unspecified dementia, unspecified severity, with anxiety: Secondary | ICD-10-CM | POA: Diagnosis present

## 2023-02-14 DIAGNOSIS — I5082 Biventricular heart failure: Secondary | ICD-10-CM | POA: Diagnosis present

## 2023-02-14 DIAGNOSIS — R1310 Dysphagia, unspecified: Secondary | ICD-10-CM | POA: Diagnosis not present

## 2023-02-14 DIAGNOSIS — R419 Unspecified symptoms and signs involving cognitive functions and awareness: Secondary | ICD-10-CM | POA: Diagnosis not present

## 2023-02-14 DIAGNOSIS — R059 Cough, unspecified: Secondary | ICD-10-CM | POA: Diagnosis not present

## 2023-02-14 DIAGNOSIS — R7303 Prediabetes: Secondary | ICD-10-CM | POA: Diagnosis present

## 2023-02-14 DIAGNOSIS — M7989 Other specified soft tissue disorders: Secondary | ICD-10-CM | POA: Diagnosis not present

## 2023-02-14 DIAGNOSIS — Z48812 Encounter for surgical aftercare following surgery on the circulatory system: Secondary | ICD-10-CM | POA: Diagnosis not present

## 2023-02-14 DIAGNOSIS — D62 Acute posthemorrhagic anemia: Secondary | ICD-10-CM | POA: Diagnosis not present

## 2023-02-14 DIAGNOSIS — F05 Delirium due to known physiological condition: Secondary | ICD-10-CM | POA: Diagnosis not present

## 2023-02-14 DIAGNOSIS — I63512 Cerebral infarction due to unspecified occlusion or stenosis of left middle cerebral artery: Secondary | ICD-10-CM | POA: Diagnosis not present

## 2023-02-14 DIAGNOSIS — Z8616 Personal history of COVID-19: Secondary | ICD-10-CM | POA: Diagnosis not present

## 2023-02-14 DIAGNOSIS — Z781 Physical restraint status: Secondary | ICD-10-CM

## 2023-02-14 DIAGNOSIS — I700 Atherosclerosis of aorta: Secondary | ICD-10-CM | POA: Diagnosis not present

## 2023-02-14 DIAGNOSIS — D689 Coagulation defect, unspecified: Secondary | ICD-10-CM | POA: Diagnosis not present

## 2023-02-14 DIAGNOSIS — W06XXXA Fall from bed, initial encounter: Secondary | ICD-10-CM | POA: Diagnosis not present

## 2023-02-14 DIAGNOSIS — J811 Chronic pulmonary edema: Secondary | ICD-10-CM | POA: Diagnosis not present

## 2023-02-14 DIAGNOSIS — I340 Nonrheumatic mitral (valve) insufficiency: Secondary | ICD-10-CM | POA: Diagnosis not present

## 2023-02-14 DIAGNOSIS — R570 Cardiogenic shock: Secondary | ICD-10-CM | POA: Diagnosis not present

## 2023-02-14 DIAGNOSIS — Z0489 Encounter for examination and observation for other specified reasons: Secondary | ICD-10-CM | POA: Diagnosis not present

## 2023-02-14 DIAGNOSIS — J9811 Atelectasis: Secondary | ICD-10-CM | POA: Diagnosis not present

## 2023-02-14 DIAGNOSIS — I639 Cerebral infarction, unspecified: Secondary | ICD-10-CM | POA: Diagnosis not present

## 2023-02-14 DIAGNOSIS — Z8249 Family history of ischemic heart disease and other diseases of the circulatory system: Secondary | ICD-10-CM

## 2023-02-14 DIAGNOSIS — J90 Pleural effusion, not elsewhere classified: Secondary | ICD-10-CM | POA: Diagnosis not present

## 2023-02-14 DIAGNOSIS — R918 Other nonspecific abnormal finding of lung field: Secondary | ICD-10-CM | POA: Diagnosis not present

## 2023-02-14 DIAGNOSIS — K5901 Slow transit constipation: Secondary | ICD-10-CM | POA: Diagnosis not present

## 2023-02-14 DIAGNOSIS — I5043 Acute on chronic combined systolic (congestive) and diastolic (congestive) heart failure: Secondary | ICD-10-CM | POA: Diagnosis not present

## 2023-02-14 DIAGNOSIS — I447 Left bundle-branch block, unspecified: Secondary | ICD-10-CM | POA: Diagnosis not present

## 2023-02-14 DIAGNOSIS — R5381 Other malaise: Secondary | ICD-10-CM | POA: Diagnosis not present

## 2023-02-14 DIAGNOSIS — E861 Hypovolemia: Secondary | ICD-10-CM | POA: Diagnosis not present

## 2023-02-14 DIAGNOSIS — E43 Unspecified severe protein-calorie malnutrition: Secondary | ICD-10-CM | POA: Insufficient documentation

## 2023-02-14 DIAGNOSIS — I509 Heart failure, unspecified: Principal | ICD-10-CM

## 2023-02-14 DIAGNOSIS — D6959 Other secondary thrombocytopenia: Secondary | ICD-10-CM | POA: Diagnosis not present

## 2023-02-14 DIAGNOSIS — I63412 Cerebral infarction due to embolism of left middle cerebral artery: Secondary | ICD-10-CM | POA: Diagnosis not present

## 2023-02-14 DIAGNOSIS — I517 Cardiomegaly: Secondary | ICD-10-CM | POA: Diagnosis not present

## 2023-02-14 DIAGNOSIS — R0989 Other specified symptoms and signs involving the circulatory and respiratory systems: Secondary | ICD-10-CM | POA: Diagnosis not present

## 2023-02-14 DIAGNOSIS — Z7982 Long term (current) use of aspirin: Secondary | ICD-10-CM

## 2023-02-14 DIAGNOSIS — Y838 Other surgical procedures as the cause of abnormal reaction of the patient, or of later complication, without mention of misadventure at the time of the procedure: Secondary | ICD-10-CM | POA: Diagnosis not present

## 2023-02-14 DIAGNOSIS — Z602 Problems related to living alone: Secondary | ICD-10-CM | POA: Diagnosis present

## 2023-02-14 DIAGNOSIS — Z01810 Encounter for preprocedural cardiovascular examination: Secondary | ICD-10-CM | POA: Diagnosis not present

## 2023-02-14 DIAGNOSIS — T8111XD Postprocedural  cardiogenic shock, subsequent encounter: Secondary | ICD-10-CM | POA: Diagnosis not present

## 2023-02-14 DIAGNOSIS — I97618 Postprocedural hemorrhage and hematoma of a circulatory system organ or structure following other circulatory system procedure: Secondary | ICD-10-CM | POA: Diagnosis not present

## 2023-02-14 DIAGNOSIS — E785 Hyperlipidemia, unspecified: Secondary | ICD-10-CM | POA: Diagnosis present

## 2023-02-14 DIAGNOSIS — Z4682 Encounter for fitting and adjustment of non-vascular catheter: Secondary | ICD-10-CM | POA: Diagnosis not present

## 2023-02-14 DIAGNOSIS — I5032 Chronic diastolic (congestive) heart failure: Secondary | ICD-10-CM | POA: Diagnosis not present

## 2023-02-14 DIAGNOSIS — T8111XA Postprocedural  cardiogenic shock, initial encounter: Secondary | ICD-10-CM

## 2023-02-14 DIAGNOSIS — I7090 Unspecified atherosclerosis: Secondary | ICD-10-CM | POA: Diagnosis not present

## 2023-02-14 DIAGNOSIS — S41111A Laceration without foreign body of right upper arm, initial encounter: Secondary | ICD-10-CM | POA: Diagnosis not present

## 2023-02-14 LAB — POCT I-STAT EG7
Acid-Base Excess: 2 mmol/L (ref 0.0–2.0)
Bicarbonate: 26.2 mmol/L (ref 20.0–28.0)
Calcium, Ion: 1.14 mmol/L — ABNORMAL LOW (ref 1.15–1.40)
HCT: 40 % (ref 39.0–52.0)
Hemoglobin: 13.6 g/dL (ref 13.0–17.0)
O2 Saturation: 59 %
Potassium: 3.7 mmol/L (ref 3.5–5.1)
Sodium: 138 mmol/L (ref 135–145)
TCO2: 27 mmol/L (ref 22–32)
pCO2, Ven: 38.7 mm[Hg] — ABNORMAL LOW (ref 44–60)
pH, Ven: 7.438 — ABNORMAL HIGH (ref 7.25–7.43)
pO2, Ven: 30 mm[Hg] — CL (ref 32–45)

## 2023-02-14 LAB — CBC
HCT: 38.8 % — ABNORMAL LOW (ref 39.0–52.0)
HCT: 39.5 % (ref 39.0–52.0)
Hemoglobin: 12.7 g/dL — ABNORMAL LOW (ref 13.0–17.0)
Hemoglobin: 12.8 g/dL — ABNORMAL LOW (ref 13.0–17.0)
MCH: 26.7 pg (ref 26.0–34.0)
MCH: 26.4 pg (ref 26.0–34.0)
MCHC: 32.7 g/dL (ref 30.0–36.0)
MCHC: 32.4 g/dL (ref 30.0–36.0)
MCV: 81.7 fL (ref 80.0–100.0)
MCV: 81.6 fL (ref 80.0–100.0)
Platelets: 213 10*3/uL (ref 150–400)
Platelets: 212 10*3/uL (ref 150–400)
RBC: 4.75 MIL/uL (ref 4.22–5.81)
RBC: 4.84 MIL/uL (ref 4.22–5.81)
RDW: 19.8 % — ABNORMAL HIGH (ref 11.5–15.5)
RDW: 19.9 % — ABNORMAL HIGH (ref 11.5–15.5)
WBC: 8.1 10*3/uL (ref 4.0–10.5)
WBC: 5.2 10*3/uL (ref 4.0–10.5)
nRBC: 0 % (ref 0.0–0.2)
nRBC: 0 % (ref 0.0–0.2)

## 2023-02-14 LAB — BASIC METABOLIC PANEL
Anion gap: 9 (ref 5–15)
BUN: 24 mg/dL — ABNORMAL HIGH (ref 8–23)
CO2: 26 mmol/L (ref 22–32)
Calcium: 8.1 mg/dL — ABNORMAL LOW (ref 8.9–10.3)
Chloride: 99 mmol/L (ref 98–111)
Creatinine, Ser: 0.87 mg/dL (ref 0.61–1.24)
GFR, Estimated: >60 mL/min (ref >60)
Glucose, Bld: 95 mg/dL (ref 70–99)
Potassium: 3.9 mmol/L (ref 3.5–5.1)
Sodium: 134 mmol/L — ABNORMAL LOW (ref 135–145)

## 2023-02-14 LAB — ECHOCARDIOGRAM COMPLETE
Est EF: <20
Height: 71 in
S' Lateral: 5.2 cm
Weight: 1992.96 [oz_av]

## 2023-02-14 LAB — CREATININE, SERUM
Creatinine, Ser: 0.9 mg/dL (ref 0.61–1.24)
GFR, Estimated: >60 mL/min (ref >60)

## 2023-02-14 LAB — HEPARIN LEVEL (UNFRACTIONATED)
Heparin Unfractionated: 0.4 [IU]/mL (ref 0.30–0.70)
Heparin Unfractionated: 0.57 [IU]/mL (ref 0.30–0.70)

## 2023-02-14 LAB — MAGNESIUM: Magnesium: 2.3 mg/dL (ref 1.7–2.4)

## 2023-02-14 MED ORDER — HEPARIN SODIUM (PORCINE) 5000 UNIT/ML IJ SOLN: 5000.0000 [IU] | Freq: Three times a day (TID) | INTRAMUSCULAR | Status: DC

## 2023-02-14 MED ORDER — ACETAMINOPHEN 325 MG PO TABS
650.0000 mg | ORAL_TABLET | ORAL | Status: DC | PRN
Start: 2023-02-14 — End: 2023-03-01
  Filled 2023-02-14: qty 2

## 2023-02-14 MED ORDER — ATORVASTATIN CALCIUM 80 MG PO TABS: 80.0000 mg | ORAL_TABLET | Freq: Every day | ORAL | Status: DC

## 2023-02-14 MED ORDER — DIGOXIN 125 MCG PO TABS
0.1250 mg | ORAL_TABLET | Freq: Every day | ORAL | Status: DC
Start: 2023-02-14 — End: 2023-03-01
  Administered 2023-02-14 – 2023-02-28 (×15): 0.125 mg via ORAL
  Filled 2023-02-14 (×15): qty 1

## 2023-02-14 MED ORDER — SODIUM CHLORIDE 0.9% FLUSH
3.0000 mL | Freq: Two times a day (BID) | INTRAVENOUS | Status: DC
  Administered 2023-02-17 – 2023-02-28 (×21): 3 mL via INTRAVENOUS

## 2023-02-14 MED ORDER — SPIRONOLACTONE 12.5 MG HALF TABLET: 12.5000 mg | ORAL_TABLET | Freq: Every day | ORAL | Status: DC

## 2023-02-14 MED ORDER — ASPIRIN 81 MG PO CHEW
81.0000 mg | CHEWABLE_TABLET | Freq: Every day | ORAL | Status: DC
  Administered 2023-02-15 – 2023-02-28 (×14): 81 mg via ORAL
  Filled 2023-02-14 (×14): qty 1

## 2023-02-14 MED ORDER — HEPARIN (PORCINE) 25000 UT/250ML-% IV SOLN
1350.0000 [IU]/h | INTRAVENOUS | Status: DC
  Administered 2023-02-14 – 2023-02-19 (×6): 1250 [IU]/h via INTRAVENOUS
  Administered 2023-02-20: 1350 [IU]/h via INTRAVENOUS
  Administered 2023-02-20: 1250 [IU]/h via INTRAVENOUS
  Administered 2023-02-21 – 2023-02-28 (×10): 1350 [IU]/h via INTRAVENOUS
  Filled 2023-02-14 (×19): qty 250

## 2023-02-14 MED ORDER — SODIUM CHLORIDE 0.9 % IV SOLN: 250.0000 mL | INTRAVENOUS | Status: AC | PRN

## 2023-02-14 MED ORDER — ONDANSETRON HCL 4 MG/2ML IJ SOLN
4.0000 mg | Freq: Four times a day (QID) | INTRAMUSCULAR | Status: DC | PRN
  Administered 2023-02-20: 4 mg via INTRAVENOUS
  Filled 2023-02-14: qty 2

## 2023-02-14 MED ORDER — SODIUM CHLORIDE 0.9% FLUSH: 3.0000 mL | INTRAVENOUS | Status: DC | PRN

## 2023-02-14 MED ORDER — SPIRONOLACTONE 25 MG PO TABS: 12.5000 mg | ORAL_TABLET | Freq: Every day | ORAL | Status: DC

## 2023-02-14 MED ORDER — ATORVASTATIN CALCIUM 80 MG PO TABS
80.0000 mg | ORAL_TABLET | Freq: Every evening | ORAL | Status: DC
  Administered 2023-02-15 – 2023-02-17 (×3): 80 mg via ORAL
  Filled 2023-02-14 (×3): qty 1

## 2023-02-14 MED ORDER — SPIRONOLACTONE 25 MG PO TABS
25.0000 mg | ORAL_TABLET | Freq: Every day | ORAL | Status: DC
  Administered 2023-02-15 – 2023-02-28 (×14): 25 mg via ORAL
  Filled 2023-02-14 (×14): qty 1

## 2023-02-14 MED ORDER — HEPARIN (PORCINE) 25000 UT/250ML-% IV SOLN: 1250.0000 [IU]/h | INTRAVENOUS | Status: DC

## 2023-02-14 NOTE — Progress Notes (Signed)
PHARMACY - ANTICOAGULATION CONSULT NOTE  Pharmacy Consult for Heparin Indication: NSTEMI, multi-vessel CAD  Allergies  Allergen Reactions   Lasix [Furosemide] Other (See Comments)    Excessive urination    Patient Measurements: Height: 68"  Weight: 55.7 kg (initial weight 56.5 kg, repeated due to weight 63.7 kg at [facility]) - lasix 60 IV q12h x 4 doses given at [facility] 12/1>>12/3 IBW/kg: 68.4 kg Heparin Dosing Weight: 55.7 kg   Vital Signs: Temp: 97.8 F (36.6 C) (12/04 1417) Temp Source: Oral (12/04 1417) BP: 120/86 (12/04 1417) Pulse Rate: 89 (12/04 1417)  Labs: Recent Labs    02/12/23 0501 02/12/23 1020 02/13/23 0415 02/13/23 1324 02/13/23 1327 02/14/23 0124 02/14/23 0950  HGB 11.6*  --  12.1* 13.6 13.6 12.7*  --   HCT 35.3*  --  36.9* 40.0 40.0 38.8*  --   PLT 198  --  215  --   --  213  --   HEPARINUNFRC  --    < > 0.45  --   --  0.40 0.57  CREATININE 0.87  --  0.88  --   --  0.87  --    < > = values in this interval not displayed.    Estimated Creatinine Clearance: 56.8 mL/min (by C-G formula based on SCr of 0.87 mg/dL).   Medical History: Past Medical History:  Diagnosis Date   Essential hypertension    HFrEF (heart failure with reduced ejection fraction) (HCC)    Mitral regurgitation     Assessment: Patient is a 77 y.o. male who presented to the ED on 02/10/2023 with SOB and chest pain. PMH includes HFrEF with EF < 20 RV mod reduced and mildly enlarged, mod pulm artery pressure, LA mod dilated, RA mildly dilated, mod pleural effusion, severe MVR. On admission BNP > 4,500 and troponins 13,606. 12/3 taken for Right/Left heart cath and coronary angiography. Showed severe three-vessel disease. Post procedure recommends CABG +/- MVR. Heparin infusion resumed 2 hours after TR band removal post-procedure. Pharmacy consulted to dose and manage heparin.   Transferred from [facility] to [facility] for TCTS evaluation. Arrived with heparin infusing at 1250 units/hr. Heparin level  therapeutic (0.57) this am. CBC stable.   Goal of Therapy:  Heparin level 0.3-0.7 units/ml Monitor platelets by anticoagulation protocol: Yes   Plan:  Continue heparin drip at 1250 units/hr Daily heparin level and CBC. Follow up TCTS consult.  Dennie Fetters, RPh 02/14/2023,5:07 PM

## 2023-02-14 NOTE — Consult Note (Signed)
Value-Based Care Institute [facility] Liaison Consult Note    02/14/2023  RADEK KEITA Jan 30, 1946 [id number]  *Value-Based Care Institute [VBCI] remote coverage review for Walter Cousin RN HL, patient at [facility]  Insurance: [employer]   Primary Care Provider: [name], NP, with Corinda Gubler at [facility], this provider is listed for the transition of care follow up appointments  and Community [facility] calls   [facility] Liaison screened the patient remotely at [facility]. Screened for disposition and patient transferring to [facility] noted.   Will anticipate transition to [facility] hospital.  Plan: [facility] Liaison will continue to follow progress and disposition to asess for post hospital community care coordination/management needs.  Referral request for community care coordination: pending disposition needs.   VBCI Community Care, Population Health does not replace or interfere with any arrangements made by the Inpatient Transition of Care team.   For questions contact:   Charlesetta Shanks, RN, BSN, CCM [place]  [facility], Population Health, [facility] Liaison Direct Dial: [phone] or secure chat Email: [email or web address]

## 2023-02-14 NOTE — Progress Notes (Signed)
PT Cancellation Note  Patient Details Name: DEROLD RAMIREZGARCIA MRN: [id number] DOB: 03/09/1946   Cancelled Treatment:    Reason Eval/Treat Not Completed: Other (comment). Per review, pt discharging to another hospital for cardio intervention. Will complete current orders at this time.   Swain Acree 02/14/2023, 10:51 AM Elizabeth Palau, PT, DPT, GCS [phone]

## 2023-02-14 NOTE — Progress Notes (Signed)
OT Cancellation Note  Patient Details Name: Walter Vargas MRN: [id number] DOB: 10/24/1945   Cancelled Treatment:     Per chart, following heart cath recommendation was made for CABG with pt/son made aware and agreeable. Pt to transfer to [facility] for procedure today. OT to hold off on eval.   [name] E Mir Fullilove 02/14/2023, 10:51 AM

## 2023-02-14 NOTE — Consult Note (Signed)
PHARMACY - ANTICOAGULATION CONSULT NOTE  Pharmacy Consult for heparin  Indication: chest pain/ACS  No Known Allergies  Patient Measurements: Height: 5\' 7"  (170.2 cm) Weight: 63.7 kg (140 lb 6.9 oz) IBW/kg (Calculated) : 66.1 Heparin Dosing Weight: 63.7 kg  Vital Signs: Temp: 98.7 F (37.1 C) (12/04 0318) Temp Source: Oral (12/04 0318) BP: 102/68 (12/04 0318) Pulse Rate: 60 (12/04 0318)  Labs: Recent Labs    02/12/23 0501 02/12/23 1020 02/12/23 1738 02/13/23 0415 02/13/23 1327 02/14/23 0124  HGB 11.6*  --   --  12.1* 13.6 12.7*  HCT 35.3*  --   --  36.9* 40.0 38.8*  PLT 198  --   --  215  --  213  HEPARINUNFRC  --    < > 0.43 0.45  --  0.40  CREATININE 0.87  --   --  0.88  --  0.87   < > = values in this interval not displayed.   Estimated Creatinine Clearance: 64.1 mL/min (by C-G formula based on SCr of 0.87 mg/dL).  Medical History: Past Medical History:  Diagnosis Date   Essential hypertension    HFrEF (heart failure with reduced ejection fraction) (HCC)    Mitral regurgitation    Medications:  Not on anticoagulation PTA  Assessment: Patient is a 77 y.o. male who presented to the ED on 02/10/2023 with SOB and chest pain. PMH includes HFrEF with EF < 20 RV mod reduced and mildly enlarged, mod pulm artery pressure, LA mod dilated, RA mildly dilated, mod pleural effusion, severe MVR. On admission BNP > 4,500 and troponins 13,606. 12/3 taken for Right/Left heart cath and coronary angiography. Post procedure recommends CABG +/- MVR. Patient wishes to discuss with patient before transfer. Planned to resume heparin infusion 2 hours after TR band removal. Pharmacy was consulted to dose and manage heparin.   Date/Time  HL Rate  Comment 11/30 2050 0.16 750 units/hr Subtherapeutic 12/01 0546 0.12 950 units/hr Subtherapeutic 12/01 1512 0.17 1050 units/hr Subtherapeutic 12/02 0023 <0.1 1250 units/hr Subtherapeutic 12/02 1020 0.35 1250 units/hr Therapeutic x1 12/02  1738 0.43 1250 units/hr  Therapeutic x2 12/03 0415      0.45    1250 units/hr   Therapeutic X 3  12/03 - heparin stopped for cath, started pending transfer for CABG 12/04 0124      0.40    1250 units/hr   Therapeutic X 1   Goal of Therapy:  Heparin level 0.3-0.7 units/ml Monitor platelets by anticoagulation protocol: Yes   Plan:  12/04:  HL @ 0124 = 0.40, therapeutic X 1 following restart - continue pt on current rate and recheck HL in 8 hrs Monitor CBC and signs/symptoms of bleeding  Thank you for involving pharmacy in this patient's care.   Brandilee Pies D, PharmD 02/14/2023 3:54 AM

## 2023-02-14 NOTE — Progress Notes (Incomplete)
Progress Note  Patient: Walter Vargas JXB:[id number] DOB: 22-Jan-1946 DOA: 02/10/2023     4 DOS: the patient was seen and examined on 02/14/2023   Brief hospital course: Mr. Jadarrion Benevento is a 77 year old male with history of hypertension, heart failure reduced ejection fraction, who presents emergency department for chief concerns of chest pain, shortness of breath, and left-sided abdominal pain. Patient recently had echocardiogram 01/16/2023 showed ejection fraction less than 20% with moderate to severe mitral regurgitation.  Lab work showed BNP was elevated > 4500.  High sensitive troponin was 13,606 and on repeat was 13,871. CT abdomen pelvis with contrast: Was read as moderate gallbladder wall thickening is noted without cholelithiasis.  Ultrasound or HIDA scan recommended to evaluate acalculous cholecystitis.  Moderate bilateral pleural effusion. Patient is seen by cardiology, started on heparin drip and IV Lasix.  HIDA scan was also ordered.  No evidence of cholecystitis. Coronary angiogram was performed on 12/3, showed severe three-vessel disease, cardiology recommended CABG.  Principal Problem:   Acute exacerbation of CHF (congestive heart failure) (HCC) Active Problems:   NSTEMI (non-ST elevated myocardial infarction) (HCC)   Thickening of wall of gallbladder   Elevated LFTs   Dyspnea on exertion   Pleural effusion   Elevated brain natriuretic peptide (BNP) level   AKI (acute kidney injury) (HCC)   Assessment and Plan: Acute on chronic systolic congestive heart failure. Moderate to severe mitral regurgitation. Bilateral pleural effusion secondary to CHF. Patient had severe systolic dysfunction with ejection fraction less than 20%.  Patient was volume overloaded at the time of admission, patient is still on IV Lasix and followed by cardiology.   Patient is a followed by cardiology to consider GDMT. Heart cath was performed today.  Showed severe three-vessel disease.   NSTEMI  (non-ST elevated myocardial infarction) (HCC) Heart cath showed severe three-vessel disease, need CABG.  Patient is undecided as for the surgery.  Patient is put back on heparin postop.    Elevated LFTs Thickening of wall of gallbladder CT scan showed thickened wall of the gallbladder, right upper quadrant ultrasound also showed thickened bladder wall with edema, but clinically, patient does not have any evidence of infection.  HIDA scan has ruled out cholecystitis.  Acute kidney injury. Hyponatremia. Mild metabolic acidosis. Hypokalemia. Condition stable, renal function normalized. Potassium also normalized.    {Tip this will not be part of the note when signed Body mass index is 21.99 kg/m. , ,  (Optional):26781}  Subjective:  Patient doing well today, no chest pain shortness of breath.  Physical Exam: Vitals:   02/13/23 1530 02/13/23 1555 02/14/23 0004 02/14/23 0318  BP: 90/62 94/63 104/76 102/68  Pulse: 65 65 79 60  Resp: (!) 21 16 19 17   Temp:  (!) 97.4 F (36.3 C) 98.9 F (37.2 C) 98.7 F (37.1 C)  TempSrc:   Oral Oral  SpO2: 93% 99% 93% 94%  Weight:      Height:       General exam: Appears calm and comfortable  Respiratory system: Clear to auscultation. Respiratory effort normal. Cardiovascular system: S1 & S2 heard, RRR. No JVD, murmurs, rubs, gallops or clicks. No pedal edema. Gastrointestinal system: Abdomen is nondistended, soft and nontender. No organomegaly or masses felt. Normal bowel sounds heard. Central nervous system: Alert and oriented. No focal neurological deficits. Extremities: Symmetric 5 x 5 power. Skin: No rashes, lesions or ulcers Psychiatry: Judgement and insight appear normal. Mood & affect appropriate.    Data Reviewed:  Lab results reviewed.  Family Communication:   Disposition: Status is: Inpatient Remains inpatient appropriate because: Severity of disease, IV treatment.  Inpatient procedure.  {Tip this will not be part of the  note when signed  DVT Prophylaxis  ., Place ted hose  (Optional):26781}   Time spent: 35 minutes  Author: Leeroy Bock, MD 02/14/2023 7:43 AM  For on call review [email or web address].

## 2023-02-14 NOTE — H&P (Addendum)
Advanced Heart Failure Team History and Physical Note   PCP:  [name], NP  PCP-Cardiology: None     Reason for Admission: Acute Heart Failure/ NSTEM  -->severe 3 vessel CAD   HPI:   Mr Paxson is a 77 year old history of HTN, MR, HFrEF, and severe 3V CAD. Brother has heart disease.   In August he was saw PCP for shortness of breath and lower extremity edema. BNP was elelvated. He was started on lasix and set up for ECHO. He delayed ECHO and cardiology appointment. Echo 01/2023 [employer] [facility], LVEF < 20% RV moderately reduced, LA mild-mod dilated, RA mildly dilated, MV mod-Severe MR.   Prior to admit he lived alone and had been fairly active until last Saturday when developed shortness breath and chest tightness.  He had been able to drive to the store. Working Chemical engineer at [employer].   Presented to [facility] 02/10/23 with chest pain and shortness of breath. Admitted with A/C HFrEF and NSTEMI. CXR small right pleural effusion and moderate left pleural effusion. CTA - negative for PE. CT abd- mod gall bladder thickening and moderate pleural effusions. Abd US-Gall bladder abnormal with edema and stones. Pertinent labs included: HS Trop 16109>60454, hgb 12.6, K 4.4, creatinine 1.37, and BNP >4500. Placed on IV lasix and heparin drip. HIDDA scan negative. Had cath yesterday with severe 3 vessel disease, RA 1, PCWP 8, PA 27/6 (11), CO 3.5, and CI 2.    Transferred to [facility] for CT surgery consultation for possible CABG. On Heparin drip. No chest pain.    Home Medications Prior to Admission medications   Medication Sig Start Date End Date Taking? Authorizing Provider  aspirin EC 81 MG tablet Take 81 mg by mouth daily.   Yes [provider]  metoprolol succinate (TOPROL-XL) 25 MG 24 hr tablet Take 25 mg by mouth daily.   Yes [provider]  Multiple Vitamins-Minerals (MULTIVITAMIN MEN 50+) TABS Take 1 tablet by mouth daily.   Yes [provider]   atorvastatin (LIPITOR) 80 MG tablet Take 1 tablet (80 mg total) by mouth daily. Patient not taking: Reported on 02/14/2023 02/15/23   Leeroy Bock, MD  furosemide (LASIX) 20 MG tablet Take 20 mg by mouth daily. Patient not taking: Reported on 02/14/2023    [provider]  heparin 09811 UT/250ML infusion Inject 1,250 Units/hr into the vein continuous. Patient not taking: Reported on 02/14/2023 02/14/23   Leeroy Bock, MD  spironolactone (ALDACTONE) 25 MG tablet Take 0.5 tablets (12.5 mg total) by mouth daily. Patient not taking: Reported on 02/14/2023 02/15/23   Leeroy Bock, MD    Past Medical History: Past Medical History:  Diagnosis Date   Essential hypertension    HFrEF (heart failure with reduced ejection fraction) (HCC)    Mitral regurgitation     Past Surgical History: Past Surgical History:  Procedure Laterality Date   NO PAST SURGERIES     RIGHT/LEFT HEART CATH AND CORONARY ANGIOGRAPHY N/A 02/13/2023   Procedure: RIGHT/LEFT HEART CATH AND CORONARY ANGIOGRAPHY;  Surgeon: Yvonne Kendall, MD;  Location: [facility] INVASIVE CV LAB;  Service: Cardiovascular;  Laterality: N/A;    Family History:  Family History  Problem Relation Age of Onset   Diabetes Mother        gestational diabetes   Hypertension Father    Alcohol abuse Father    Heart disease Brother    Alcohol abuse Brother    Drug abuse Brother  Social History: Social History   Socioeconomic History   Marital status: Single    Spouse name: Not on file   Number of children: Not on file   Years of education: Not on file   Highest education level: Not on file  Occupational History   Not on file  Tobacco Use   Smoking status: Former    Current packs/day: 0.00    Average packs/day: 0.5 packs/day for 10.0 years (5.0 ttl pk-yrs)    Types: Cigarettes    Start date: 44    Quit date: 20    Years since quitting: 46.9   Smokeless tobacco: Never  Vaping Use   Vaping status: Never  Used  Substance and Sexual Activity   Alcohol use: Not Currently    Comment: no alcohol since 77 years old   Drug use: Never   Sexual activity: Not Currently  Other Topics Concern   Not on file  Social History Narrative   Not on file   Social Determinants of Health   Financial Resource Strain: Not on file  Food Insecurity: No Food Insecurity (02/14/2023)   Hunger Vital Sign    Worried About Running Out of Food in the Last Year: Never true    Ran Out of Food in the Last Year: Never true  Transportation Needs: No Transportation Needs (02/14/2023)   PRAPARE - Administrator, Civil Service (Medical): No    Lack of Transportation (Non-Medical): No  Physical Activity: Not on file  Stress: Not on file  Social Connections: Not on file    Allergies:  Allergies  Allergen Reactions   Lasix [Furosemide] Other (See Comments)    Excessive urination    Objective:    Vital Signs:   Temp:  [97.4 F (36.3 C)-98.9 F (37.2 C)] 97.8 F (36.6 C) (12/04 1417) Pulse Rate:  [60-89] 89 (12/04 1417) Resp:  [16-20] 16 (12/04 1417) BP: (89-120)/(61-86) 120/86 (12/04 1417) SpO2:  [93 %-100 %] 100 % (12/04 1417) Weight:  [56.5 kg] 56.5 kg (12/04 1412)   Filed Weights   02/14/23 1412  Weight: 56.5 kg     Physical Exam     General:  In bed. Thin. NAD. No respiratory difficulty HEENT: Normal Neck: Supple. no JVD. Carotids 2+ bilat; no bruits. No lymphadenopathy or thyromegaly appreciated. Cor: PMI nondisplaced. Regular rate & rhythm. No rubs, gallops or murmurs. Lungs: Clear Abdomen: Soft, nontender, nondistended. No hepatosplenomegaly. No bruits or masses. Good bowel sounds. Extremities: No cyanosis, clubbing, rash, edema Neuro: Alert & oriented x 3, cranial nerves grossly intact. moves all 4 extremities w/o difficulty. Affect pleasant.   Telemetry  SR 70s   EKG   N/A  Labs     Basic Metabolic Panel: Recent Labs  Lab 02/10/23 1120 02/11/23 0545 02/11/23 0546  02/12/23 0501 02/13/23 0415 02/13/23 1324 02/13/23 1327 02/14/23 0124  NA 136  --  134* 136 137 138 138 134*  K 4.4  --  3.7 3.4* 3.8 3.7 3.7 3.9  CL 102  --  101 102 102  --   --  99  CO2 19*  --  23 26 25   --   --  26  GLUCOSE 110*  --  74 67* 75  --   --  95  BUN 22  --  30* 26* 27*  --   --  24*  CREATININE 1.37*  --  1.20 0.87 0.88  --   --  0.87  CALCIUM 8.7*  --  8.3* 8.0* 8.3*  --   --  8.1*  MG  --  1.9  --  1.9 2.5*  --   --  2.3    Liver Function Tests: Recent Labs  Lab 02/10/23 1322 02/11/23 0546  AST 124* 193*  ALT 46* 106*  ALKPHOS 58 52  BILITOT 2.1* 1.4*  PROT 6.6 5.8*  ALBUMIN 3.4* 3.2*   Recent Labs  Lab 02/10/23 1322  LIPASE 22   Recent Labs  Lab 02/10/23 1715  AMMONIA 14    CBC: Recent Labs  Lab 02/10/23 1120 02/11/23 0546 02/12/23 0501 02/13/23 0415 02/13/23 1324 02/13/23 1327 02/14/23 0124  WBC 10.1 6.2 5.3 5.0  --   --  8.1  HGB 12.6* 11.6* 11.6* 12.1* 13.6 13.6 12.7*  HCT 41.8 35.3* 35.3* 36.9* 40.0 40.0 38.8*  MCV 87.3 80.8 81.3 80.7  --   --  81.7  PLT 237 176 198 215  --   --  213    Cardiac Enzymes: No results for input(s): "CKTOTAL", "CKMB", "CKMBINDEX", "TROPONINI" in the last 168 hours.  BNP: BNP (last 3 results) Recent Labs    02/10/23 1120  BNP >4,500.0*    ProBNP (last 3 results) Recent Labs    10/12/22 1030 10/19/22 0852 01/08/23 0957  PROBNP 2,878.0* 2,297.0* >4978.0*     CBG: No results for input(s): "GLUCAP" in the last 168 hours.  Coagulation Studies: No results for input(s): "LABPROT", "INR" in the last 72 hours.  Imaging: CARDIAC CATHETERIZATION  Result Date: 02/13/2023 Conclusions: Severe three-vessel coronary artery disease, as detailed below. Normal left and right heart filling pressures. Mild-moderately reduced Fick cardiac output/index. Recommendations: Cardiac surgery consultation for CABG +/- MVR.  Patient wishes to discuss with his son before agreeing to transfer. Resume heparin  infusion 2 hours after TR band removal. Aggressive secondary prevention of coronary artery disease. Maintain net even fluid balance.  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 Yvonne Kendall, MD [facility]    Patient Profile  Mr Prante is a 77 year old history of HTN, MR, HFrEF, and severe 3V CAD.   Transferred to [facility] for CT surgery consultation for possible CABG.   Assessment/Plan  1. CAD--> NSTEMI, Severe 3V CAD  Admitted to [facility] with NSTEMI.  HS Trop 13K. Cath with severe 3V disease. Continue aspirin, high intensity statin, and heparin drip.   Repeat ECHO now.  CT Surgery consulted.   2. Acute Biventricular HF, ICM  Echo 01/16/2023 LVEF 20% RV moderately reduced, and mod- severe MR. Diuresed at [facility] and had cath. RHC CI 2. Low filling pressures.  Appears euvolemic. BP has been soft.  Continue 12.5 mg spironolactone daily. Consider increasing tomorrow.  Reviewed BMET from earlier today, stable.  As above repeat ECHO now.   3. Gall Bladder Thickening HIDDA Scan- No acute cholecystitis     Tonye Becket, NP 02/14/2023, 4:03 PM  Advanced Heart Failure Team Pager [phone] (M-F; 7a - 5p)  Please contact [facility] for night-coverage after hours (4p -7a ) and weekends on [email or web address]   Patient seen with NP, agree with the above note.   He reports dyspnea with moderate exertion for about 2 months, he has not had prominent chest pain. Last Saturday, he developed worsening shortness of breath and chest pain. To [facility] with BNP > 4500 and HS-TnI in 45409 range. Started on IV heparin and IV Lasix.  Echo showed EF < 20%, moderate RV dysfunction, mod-severe MR.  LHC was done  showing severe 3 vessel disease not amenable to PCI (see report). RHC showed low filling pressures and CI 2, PAPi good at 7.5.  He was transferred to [facility] for consideration of CABG.    Repeat echo done at bedside today: EF 20%, mild-moderate RV dysfunction, mild-moderate  MR.  Patient currently denies dyspnea or chest pain.  He has apparently had some confusion but is able to converse with me normally.  Head CT this admission with no acute findings.   General: NAD Neck: JVP 8 cm, no thyromegaly or thyroid nodule.  Lungs: Clear to auscultation bilaterally with normal respiratory effort. CV: Lateral PMI.  Heart regular S1/S2, no S3/S4, no murmur.  No peripheral edema.  No carotid bruit.  Normal pedal pulses.  Abdomen: Soft, nontender, no hepatosplenomegaly, no distention.  Skin: Intact without lesions or rashes.  Neurologic: Alert and oriented x 3.  Psych: Normal affect. Extremities: No clubbing or cyanosis.  HEENT: Normal.   1. CAD: Patient admitted with NSTEMI (?culprit D2) but with severe extensive diffuse 3 vessel disease.  Disease too extensive to treat percutaneously.  Anatomy would be best served by CABG, but CABG would be moderate-high risk given cardiomyopathy.  Echo today showed mild-moderate RV dysfunction and PAPi was adequate on RHC, so do not think RV function will be a limitation.  Patient seems to be reasonably active at baseline without significant comorbidities. - TCTS consult for high risk CABG.  - Continue heparin gtt for now.  - Continue ASA 81 - Continue statin.  - We could obtain a viability study, but LV walls are not severely thinned and presence/absence of viability has not been shown to have a significant impact on outcome for CABG in heart failure.  2. Acute on chronic systolic CHF: Patient has had HF symptoms for at least a couple of months.  Suspect pre-existing ischemic cardiomyopathy then he presented this admission with NSTEMI possible in D2 territory leading to CHF exacerbation. He has been diuresed, RHC yesterday showed normal filling pressures and low CI at 2.0. Echo today showed EF 20%, RV mild-moderately dysfunctional, mild-moderate MR.  On exam today, he does not look markedly volume overloaded but IVC on echo is mildly dilated.    - Start digoxin 0.125 daily.  - Increase spironolactone to 25 daily.  - Add on Lasix 20 mg daily tomorrow.  - Suspect patient's best chance to improve LV function will be CABG.  If deemed too high risk, he may be LVAD candidate.  3. Delirium: Patient has had on and off confusion in hospital.  He is clear with me today during our conversation.  He would be at risk for worsening post-op delirium in setting of CABG.  4. Gallbladder disease: Patient had gallstones but HIDA scan not suggestive of cholecystitis.   Marca Ancona 02/14/2023 5:17 PM

## 2023-02-14 NOTE — Consult Note (Signed)
PHARMACY - ANTICOAGULATION CONSULT NOTE  Pharmacy Consult for heparin  Indication: chest pain/ACS  No Known Allergies  Patient Measurements: Height: 5\' 7"  (170.2 cm) Weight: 63.7 kg (140 lb 6.9 oz) IBW/kg (Calculated) : 66.1 Heparin Dosing Weight: 63.7 kg  Vital Signs: Temp: 97.4 F (36.3 C) (12/04 0836) Temp Source: Oral (12/04 0318) BP: 89/61 (12/04 0836) Pulse Rate: 82 (12/04 0836)  Labs: Recent Labs    02/12/23 0501 02/12/23 1020 02/13/23 0415 02/13/23 1324 02/13/23 1327 02/14/23 0124 02/14/23 0950  HGB 11.6*  --  12.1* 13.6 13.6 12.7*  --   HCT 35.3*  --  36.9* 40.0 40.0 38.8*  --   PLT 198  --  215  --   --  213  --   HEPARINUNFRC  --    < > 0.45  --   --  0.40 0.57  CREATININE 0.87  --  0.88  --   --  0.87  --    < > = values in this interval not displayed.   Estimated Creatinine Clearance: 64.1 mL/min (by C-G formula based on SCr of 0.87 mg/dL).  Medical History: Past Medical History:  Diagnosis Date   Essential hypertension    HFrEF (heart failure with reduced ejection fraction) (HCC)    Mitral regurgitation    Medications:  Not on anticoagulation PTA  Assessment: Patient is a 77 y.o. male who presented to the ED on 02/10/2023 with SOB and chest pain. PMH includes HFrEF with EF < 20 RV mod reduced and mildly enlarged, mod pulm artery pressure, LA mod dilated, RA mildly dilated, mod pleural effusion, severe MVR. On admission BNP > 4,500 and troponins 13,606. 12/3 taken for Right/Left heart cath and coronary angiography. Showed severe three-vessel disease. Post procedure recommends CABG +/- MVR. Patient wishes to discuss with family and is undecided regarding the surgery. Patient to be put back on heparin postop. Planned to resume heparin infusion 2 hours after TR band removal. Pharmacy was consulted to dose and manage heparin.   Date/Time  HL Rate  Comment 11/30 2050 0.16 750 units/hr Subtherapeutic 12/01 0546 0.12 950 units/hr Subtherapeutic 12/01  1512 0.17 1050 units/hr Subtherapeutic 12/02 0023 <0.1 1250 units/hr Subtherapeutic 12/02 1020 0.35 1250 units/hr Therapeutic x1 12/02 1738 0.43 1250 units/hr  Therapeutic x2 12/03 0415      0.45    1250 units/hr   Therapeutic X 3  12/03 - heparin stopped for cath, started pending transfer for CABG 12/04  0124  0.40  1250 units/hr  Therapeutic X 1  12/04 0950 0.57 1250 units/hr Therapeutic x 2 Goal of Therapy:  Heparin level 0.3-0.7 units/ml Monitor platelets by anticoagulation protocol: Yes   Plan:  Continue heparin infusion 1,250 units/hour Monitor Anti-Xa with AM labs (therapeutic x 2) Follow cardio recs and patient decision for possible CABG for duration of heparin gtt Monitor CBC daily and signs/symptoms of bleeding  Thank you for involving pharmacy in this patient's care.   Effie Shy, PharmD Pharmacy Resident  02/14/2023 10:29 AM

## 2023-02-14 NOTE — Discharge Summary (Signed)
Physician Discharge Summary  Patient: Walter Vargas QQV:[id number] DOB: 07/14/1945   Code Status: Full Code Admit date: 02/10/2023 Discharge date: 02/14/2023 Disposition:  [facility] , [facility] PCP: [name], NP  Recommendations for Outpatient Follow-up:  Follow up with hospitalist upon arrival CVTS, cardiology in regard to managing CABG  Discharge Diagnoses:  Principal Problem:   Acute exacerbation of CHF (congestive heart failure) (HCC) Active Problems:   NSTEMI (non-ST elevated myocardial infarction) (HCC)   Thickening of wall of gallbladder   Elevated LFTs   Dyspnea on exertion   Pleural effusion   Elevated brain natriuretic peptide (BNP) level   AKI (acute kidney injury) [facility])  Brief Hospital Course Summary: [name] is a 77 year old male with history of hypertension, heart failure reduced ejection fraction, who presents emergency department for chief concerns of chest pain, shortness of breath, and left-sided abdominal pain. Patient recently had echocardiogram 01/16/2023 showed ejection fraction less than 20% with moderate to severe mitral regurgitation.  Lab work showed BNP was elevated > 4500.  High sensitive troponin was 13,606 and on repeat was 13,871.  CT abdomen pelvis with contrast: Was read as moderate gallbladder wall thickening is noted without cholelithiasis. Moderate bilateral pleural effusion.  Evaluated by cardiology, started on heparin drip and IV Lasix for suspected NSTEMI.   HIDA scan was also ordered.  No evidence of cholecystitis.  Coronary angiogram was performed on 12/3, showed severe three-vessel disease, cardiology recommended CABG.  Patient has remained stable on heparin gtt and has decided to proceed with CABG procedure. He is chest pain free currently and is awaiting transfer to [facility] for further management.   Discharge Condition: Stable, improved Recommended discharge diet: Cardiac  diet  Consultations: Cardiology CVTS  Procedures/Studies: LHC, RHC HIDA scan  Allergies as of 02/14/2023   No Known Allergies      Medication List     STOP taking these medications    furosemide 20 MG tablet Commonly known as: LASIX   metoprolol succinate 25 MG 24 hr tablet Commonly known as: TOPROL-XL   multivitamin tablet   vitamin C 1000 MG tablet       TAKE these medications    aspirin EC 81 MG tablet Take by mouth daily. Swallow whole. 1/2 TABLET DAILY   atorvastatin 80 MG tablet Commonly known as: LIPITOR Take 1 tablet (80 mg total) by mouth daily. Start taking on: February 15, 2023   heparin 33295 UT/250ML infusion Inject 1,250 Units/hr into the vein continuous.   spironolactone 25 MG tablet Commonly known as: ALDACTONE Take 0.5 tablets (12.5 mg total) by mouth daily. Start taking on: February 15, 2023       Subjective   Pt reports feeling well today. Denies SOB or chest pain. Primary complaint is being bored from prolonged stay in hospital room. He explains that he desires to be transferred to [facility] to proceed with CABG.   All questions and concerns were addressed at time of discharge.  Objective  Blood pressure (!) 89/61, pulse 82, temperature (!) 97.4 F (36.3 C), resp. rate 20, height 5\' 7"  (1.702 m), weight 63.7 kg, SpO2 100%.   General: Pt is alert, awake, not in acute distress. Cardiovascular: RRR, S1/S2 +, no rubs, no gallops Respiratory: CTA bilaterally, no wheezing, no rhonchi Abdominal: Soft, NT, ND, bowel sounds + Extremities: no edema, no cyanosis  The results of significant diagnostics from this hospitalization (including imaging, microbiology, ancillary and laboratory) are listed below for reference.   Imaging studies:  CARDIAC CATHETERIZATION  Result Date: 02/13/2023 Conclusions: Severe three-vessel coronary artery disease, as detailed below. Normal left and right heart filling pressures. Mild-moderately reduced Fick cardiac  output/index. Recommendations: Cardiac surgery consultation for CABG +/- MVR.  Patient wishes to discuss with his son before agreeing to transfer. Resume heparin infusion 2 hours after TR band removal. Aggressive secondary prevention of coronary artery disease. Maintain net even fluid balance.  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 Yvonne Kendall, MD [facility]  NM Hepato W/EF  Result Date: 02/12/2023 CLINICAL DATA:  Cholelithiasis.  Concern for cholecystitis. EXAM: NUCLEAR MEDICINE HEPATOBILIARY IMAGING WITH GALLBLADDER EF TECHNIQUE: Sequential images of the abdomen were obtained out to 60 minutes following intravenous administration of radiopharmaceutical. After oral ingestion of Ensure, gallbladder ejection fraction was determined. At 60 min, normal ejection fraction is greater than 33%. RADIOPHARMACEUTICALS:  5.6 mCi Tc-49m  Choletec IV COMPARISON:  Ultrasound 02/10/2023 FINDINGS: Prompt uptake and biliary excretion of activity by the liver is seen. Gallbladder activity is visualized, consistent with patency of cystic duct. Biliary activity passes into small bowel, consistent with patent common bile duct. Fatty meal was administered to augment contraction of the gallbladder. Minimal contraction of the gallbladder with fatty meal stimulation. Calculated gallbladder ejection fraction is 0%. (Normal gallbladder ejection fraction with Ensure is greater than 33% and less than 80%.) IMPRESSION: 1. Patent cystic duct and common bile duct. No evidence of acute cholecystitis. 2. Failure of the gallbladder to contracted after administration of fatty meal. Electronically Signed   By: Genevive Bi M.D.   On: 02/12/2023 17:06   US ABDOMEN LIMITED RUQ (LIVER/GB)  Result Date: 02/10/2023 CLINICAL DATA:  Abnormal gallbladder on CT EXAM: ULTRASOUND ABDOMEN LIMITED RIGHT UPPER QUADRANT COMPARISON:  CT 02/10/2023. FINDINGS: Gallbladder: Dilated gallbladder  with stones and sludge. There is also significant wall thickening up to 7 mm with gallbladder wall edema. Common bile duct: Diameter: 3 mm Liver: Mildly complex cystic areas again seen left hepatic lobe lateral segment as seen on prior CT. Within normal limits in parenchymal echogenicity. Portal vein is patent on color Doppler imaging with normal direction of blood flow towards the liver. Other: Pleural effusion. IMPRESSION: Abnormal gallbladder with wall thickening, edema with stones and sludge. Acute cholecystitis is possible. If needed further workup with HIDA scan. No ductal dilatation. Right-sided pleural effusion. Electronically Signed   By: Karen Kays M.D.   On: 02/10/2023 15:00   CT ABDOMEN PELVIS W CONTRAST  Result Date: 02/10/2023 CLINICAL DATA:  Epigastric abdominal pain. EXAM: CT ABDOMEN AND PELVIS WITH CONTRAST TECHNIQUE: Multidetector CT imaging of the abdomen and pelvis was performed using the standard protocol following bolus administration of intravenous contrast. RADIATION DOSE REDUCTION: This exam was performed according to the departmental dose-optimization program which includes automated exposure control, adjustment of the mA and/or kV according to patient size and/or use of iterative reconstruction technique. CONTRAST:  OMNIPAQUE IOHEXOL 350 MG/ML SOLN COMPARISON:  None Available. FINDINGS: Lower chest: Bilateral pleural effusions are noted, right greater than left. Hepatobiliary: Moderate gallbladder wall thickening is noted without cholelithiasis. No biliary dilatation is noted. Probable 18 mm cyst seen in left hepatic lobe. Pancreas: Unremarkable. No pancreatic ductal dilatation or surrounding inflammatory changes. Spleen: Normal in size without focal abnormality. Adrenals/Urinary Tract: Adrenal glands are unremarkable. Kidneys are normal, without renal calculi, focal lesion, or hydronephrosis. Bladder is unremarkable. Stomach/Bowel: Stomach is within normal limits. Appendix  appears normal. No evidence of bowel wall thickening, distention, or inflammatory changes. Vascular/Lymphatic:  Aortic atherosclerosis. No enlarged abdominal or pelvic lymph nodes. Reproductive: Prostate is unremarkable. Other: Moderate size fat containing periumbilical hernia is noted. Minimal free fluid is noted in the pelvis. Musculoskeletal: Severe degenerative changes seen involving the left hip. L2 level degenerative disc disease is noted in lumbar spine. No acute osseous abnormality is noted. IMPRESSION: Moderate gallbladder wall thickening is noted without cholelithiasis. Ultrasound or HIDA scan is recommended to evaluate for acalculous cholecystitis. Moderate bilateral pleural effusions. Moderate size fat containing periumbilical hernia. Minimal free fluid is noted in the pelvis of uncertain etiology. Aortic Atherosclerosis (ICD10-I70.0). Electronically Signed   By: Lupita Raider M.D.   On: 02/10/2023 13:34   CT Head Wo Contrast  Result Date: 02/10/2023 CLINICAL DATA:  Altered mental status EXAM: CT HEAD WITHOUT CONTRAST TECHNIQUE: Contiguous axial images were obtained from the base of the skull through the vertex without intravenous contrast. RADIATION DOSE REDUCTION: This exam was performed according to the departmental dose-optimization program which includes automated exposure control, adjustment of the mA and/or kV according to patient size and/or use of iterative reconstruction technique. COMPARISON:  None Available. FINDINGS: Brain: No evidence of acute infarction, hemorrhage, hydrocephalus, extra-axial collection or mass lesion/mass effect. Patchy low-density changes within the periventricular and subcortical white matter most compatible with chronic microvascular ischemic change. Moderate diffuse cerebral volume loss. Vascular: Atherosclerotic calcifications involving the large vessels of the skull base. No unexpected hyperdense vessel. Skull: Normal. Negative for fracture or focal lesion.  Sinuses/Orbits: No acute finding. Other: None. IMPRESSION: 1. No acute intracranial findings. 2. Chronic microvascular ischemic change and cerebral volume loss. Electronically Signed   By: Duanne Guess D.O.   On: 02/10/2023 13:16   CT Angio Chest PE W and/or Wo Contrast  Result Date: 02/10/2023 CLINICAL DATA:  Shortness of breath, hypoxia. EXAM: CT ANGIOGRAPHY CHEST WITH CONTRAST TECHNIQUE: Multidetector CT imaging of the chest was performed using the standard protocol during bolus administration of intravenous contrast. Multiplanar CT image reconstructions and MIPs were obtained to evaluate the vascular anatomy. RADIATION DOSE REDUCTION: This exam was performed according to the departmental dose-optimization program which includes automated exposure control, adjustment of the mA and/or kV according to patient size and/or use of iterative reconstruction technique. CONTRAST:  OMNIPAQUE IOHEXOL 350 MG/ML SOLN COMPARISON:  None Available. FINDINGS: Cardiovascular: Satisfactory opacification of the pulmonary arteries to the segmental level. No evidence of pulmonary embolism. Normal heart size. No pericardial effusion. Mediastinum/Nodes: No enlarged mediastinal, hilar, or axillary lymph nodes. Thyroid gland, trachea, and esophagus demonstrate no significant findings. Lungs/Pleura: Moderate size right pleural effusion is noted with small left pleural effusion, but no pneumothorax is noted. Right middle lobe opacity is noted concerning for atelectasis or pneumonia. Mild left basilar opacity is noted most consistent with atelectasis or possibly infiltrate. Upper Abdomen: No acute abnormality. Musculoskeletal: No chest wall abnormality. No acute or significant osseous findings. Review of the MIP images confirms the above findings. IMPRESSION: No definite evidence of pulmonary embolus. Moderate size right pleural effusion is noted with small left pleural effusion. Right middle lobe opacity is noted concerning  for atelectasis or pneumonia. Mild left basilar opacity is noted most consistent with atelectasis or infiltrate. Aortic Atherosclerosis (ICD10-I70.0). Electronically Signed   By: Lupita Raider M.D.   On: 02/10/2023 13:14   DG Chest 2 View  Result Date: 02/10/2023 CLINICAL DATA:  Chest pain EXAM: CHEST - 2 VIEW COMPARISON:  Chest radiograph dated 10/12/2022 FINDINGS: Normal lung volumes. Bibasilar patchy opacities. Small right and moderate left pleural  effusions. No pneumothorax. Similar cardiomediastinal silhouette. No acute osseous abnormality. IMPRESSION: 1. Small right and moderate left pleural effusions. 2. Bibasilar patchy opacities, likely atelectasis. Aspiration or pneumonia can be considered in the appropriate clinical setting. Electronically Signed   By: Agustin Cree M.D.   On: 02/10/2023 12:12   ECHOCARDIOGRAM COMPLETE  Result Date: 01/16/2023    ECHOCARDIOGRAM REPORT   Patient Name:   KARY FRONTINO Date of Exam: 01/16/2023 Medical Rec #:  [id number]     Height:       68.0 in Accession #:    [id number]    Weight:       137.8 lb Date of Birth:  1946-01-07     BSA:          1.744 m Patient Age:    77 years      BP:           104/80 mmHg Patient Gender: M             HR:           81 bpm. Exam Location:  [facility] Procedure: 2D Echo, Cardiac Doppler and Color Doppler Indications:     Dyspnea R06.00  History:         Patient has no prior history of Echocardiogram examinations. No                  past medical history on file.  Sonographer:     Cristela Blue Referring Phys:  [id number] Genene Churn CABLE Diagnosing Phys: Lorine Bears MD IMPRESSIONS  1. Left ventricular ejection fraction, by estimation, is <20%. The left ventricle has severely decreased function. The left ventricle demonstrates global hypokinesis. The left ventricular internal cavity size was mildly dilated. Left ventricular diastolic parameters are indeterminate.  2. Right ventricular systolic function is moderately reduced. The right ventricular size  is mildly enlarged. There is moderately elevated pulmonary artery systolic pressure.  3. Left atrial size was mild to moderately dilated.  4. Right atrial size was mildly dilated.  5. Moderate pleural effusion in the left lateral region.  6. The mitral valve is normal in structure. Moderate to severe mitral valve regurgitation. No evidence of mitral stenosis.  7. The aortic valve is normal in structure. Aortic valve regurgitation is not visualized. Aortic valve sclerosis/calcification is present, without any evidence of aortic stenosis.  8. The inferior vena cava is dilated in size with <50% respiratory variability, suggesting right atrial pressure of 15 mmHg. FINDINGS  Left Ventricle: Left ventricular ejection fraction, by estimation, is <20%. The left ventricle has severely decreased function. The left ventricle demonstrates global hypokinesis. The left ventricular internal cavity size was mildly dilated. There is no  left ventricular hypertrophy. Left ventricular diastolic parameters are indeterminate. Right Ventricle: The right ventricular size is mildly enlarged. No increase in right ventricular wall thickness. Right ventricular systolic function is moderately reduced. There is moderately elevated pulmonary artery systolic pressure. The tricuspid regurgitant velocity is 3.27 m/s, and with an assumed right atrial pressure of 15 mmHg, the estimated right ventricular systolic pressure is 57.8 mmHg. Left Atrium: Left atrial size was mild to moderately dilated. Right Atrium: Right atrial size was mildly dilated. Pericardium: Trivial pericardial effusion is present. Mitral Valve: The mitral valve is normal in structure. There is mild thickening of the mitral valve leaflet(s). Moderate to severe mitral valve regurgitation. No evidence of mitral valve stenosis. Tricuspid Valve: The tricuspid valve is normal in structure. Tricuspid valve regurgitation is mild . No evidence  of tricuspid stenosis. Aortic Valve: The aortic  valve is normal in structure. Aortic valve regurgitation is not visualized. Aortic valve sclerosis/calcification is present, without any evidence of aortic stenosis. Pulmonic Valve: The pulmonic valve was normal in structure. Pulmonic valve regurgitation is mild. No evidence of pulmonic stenosis. Aorta: The aortic root is normal in size and structure. Venous: The inferior vena cava is dilated in size with less than 50% respiratory variability, suggesting right atrial pressure of 15 mmHg. IAS/Shunts: No atrial level shunt detected by color flow Doppler. Additional Comments: There is a moderate pleural effusion in the left lateral region.  LEFT VENTRICLE PLAX 2D LVIDd:         5.50 cm      Diastology LVIDs:         5.10 cm      LV e' lateral:   7.72 cm/s LV PW:         1.10 cm      LV E/e' lateral: 11.2 LV IVS:        1.00 cm LVOT diam:     2.00 cm LVOT Area:     3.14 cm  LV Volumes (MOD) LV vol d, MOD A2C: 184.0 ml LV vol d, MOD A4C: 167.0 ml LV vol s, MOD A2C: 173.0 ml LV vol s, MOD A4C: 145.0 ml LV SV MOD A2C:     11.0 ml LV SV MOD A4C:     167.0 ml LV SV MOD BP:      14.3 ml RIGHT VENTRICLE RV Basal diam:  4.50 cm RV Mid diam:    3.00 cm LEFT ATRIUM             Index        RIGHT ATRIUM           Index LA diam:        4.50 cm 2.58 cm/m   RA Area:     18.90 cm LA Vol (A2C):   40.8 ml 23.39 ml/m  RA Volume:   57.70 ml  33.08 ml/m LA Vol (A4C):   36.7 ml 21.04 ml/m LA Biplane Vol: 39.6 ml 22.70 ml/m   AORTA Ao Root diam: 3.20 cm MITRAL VALVE                  TRICUSPID VALVE MV Area (PHT): 4.26 cm       TR Peak grad:   42.8 mmHg MV Decel Time: 178 msec       TR Vmax:        327.00 cm/s MR Peak grad:    80.6 mmHg MR Mean grad:    52.0 mmHg    SHUNTS MR Vmax:         449.00 cm/s  Systemic Diam: 2.00 cm MR Vmean:        343.0 cm/s MR PISA:         5.09 cm MR PISA Eff ROA: 39 mm MR PISA Radius:  0.90 cm MV E velocity: 86.50 cm/s Lorine Bears MD Electronically signed by Lorine Bears MD Signature Date/Time:  01/16/2023/1:26:49 PM    Final     Labs: Basic Metabolic Panel: Recent Labs  Lab 02/10/23 1120 02/11/23 0545 02/11/23 0546 02/12/23 0501 02/13/23 0415 02/13/23 1324 02/13/23 1327 02/14/23 0124  NA 136  --  134* 136 137 138 138 134*  K 4.4  --  3.7 3.4* 3.8 3.7 3.7 3.9  CL 102  --  101 102 102  --   --  99  CO2 19*  --  23 26 25   --   --  26  GLUCOSE 110*  --  74 67* 75  --   --  95  BUN 22  --  30* 26* 27*  --   --  24*  CREATININE 1.37*  --  1.20 0.87 0.88  --   --  0.87  CALCIUM 8.7*  --  8.3* 8.0* 8.3*  --   --  8.1*  MG  --  1.9  --  1.9 2.5*  --   --  2.3   CBC: Recent Labs  Lab 02/10/23 1120 02/11/23 0546 02/12/23 0501 02/13/23 0415 02/13/23 1324 02/13/23 1327 02/14/23 0124  WBC 10.1 6.2 5.3 5.0  --   --  8.1  HGB 12.6* 11.6* 11.6* 12.1* 13.6 13.6 12.7*  HCT 41.8 35.3* 35.3* 36.9* 40.0 40.0 38.8*  MCV 87.3 80.8 81.3 80.7  --   --  81.7  PLT 237 176 198 215  --   --  213   Microbiology: No results found for this or any previous visit.  Time coordinating discharge: Over 30 minutes  Leeroy Bock, MD  [facility] 02/14/2023, 10:44 AM

## 2023-02-14 NOTE — Progress Notes (Signed)
Advanced Heart Failure Rounding Note  PCP-Cardiologist: None   Subjective:    - Denies SOB/CP - Intermittently confused during our conversation but able to tell me year, president, president-elect, city and why he is in the hospital.    Objective:   Weight Range: 63.7 kg Body mass index is 21.99 kg/m.   Vital Signs:   Temp:  [97.4 F (36.3 C)-98.9 F (37.2 C)] 97.4 F (36.3 C) (12/04 0836) Pulse Rate:  [60-82] 82 (12/04 0836) Resp:  [11-24] 20 (12/04 0836) BP: (81-119)/(53-84) 89/61 (12/04 0836) SpO2:  [84 %-100 %] 100 % (12/04 0836) Weight:  [63.7 kg] 63.7 kg (12/03 1144) Last BM Date : 02/13/23  Weight change: Filed Weights   02/10/23 2100 02/13/23 1144  Weight: 63.7 kg 63.7 kg    Intake/Output:   Intake/Output Summary (Last 24 hours) at 02/14/2023 1041 Last data filed at 02/14/2023 0422 Gross per 24 hour  Intake 1367.1 ml  Output 700 ml  Net 667.1 ml      Physical Exam    General:  eating breakfast at bedside chair HEENT: Normal Neck: Supple. JVP flat . Carotids 2+ bilat; no bruits. No lymphadenopathy or thyromegaly appreciated. Cor: RRR Lungs: CTA B/L Abdomen: Soft, nontender, nondistended. No hepatosplenomegaly. No bruits or masses. Good bowel sounds. Extremities: No edema Neuro: Alert & orientedx3, cranial nerves grossly intact. moves all 4 extremities w/o difficulty. Affect pleasant   Telemetry   SR 60-70s   EKG    N/A  Labs    CBC Recent Labs    02/13/23 0415 02/13/23 1324 02/13/23 1327 02/14/23 0124  WBC 5.0  --   --  8.1  HGB 12.1*   < > 13.6 12.7*  HCT 36.9*   < > 40.0 38.8*  MCV 80.7  --   --  81.7  PLT 215  --   --  213   < > = values in this interval not displayed.   Basic Metabolic Panel Recent Labs    54/09/81 0415 02/13/23 1324 02/13/23 1327 02/14/23 0124  NA 137   < > 138 134*  K 3.8   < > 3.7 3.9  CL 102  --   --  99  CO2 25  --   --  26  GLUCOSE 75  --   --  95  BUN 27*  --   --  24*  CREATININE 0.88   --   --  0.87  CALCIUM 8.3*  --   --  8.1*  MG 2.5*  --   --  2.3   < > = values in this interval not displayed.   Liver Function Tests No results for input(s): "AST", "ALT", "ALKPHOS", "BILITOT", "PROT", "ALBUMIN" in the last 72 hours.  No results for input(s): "LIPASE", "AMYLASE" in the last 72 hours.  Cardiac Enzymes No results for input(s): "CKTOTAL", "CKMB", "CKMBINDEX", "TROPONINI" in the last 72 hours.  BNP: BNP (last 3 results) Recent Labs    02/10/23 1120  BNP >4,500.0*    ProBNP (last 3 results) Recent Labs    10/12/22 1030 10/19/22 0852 01/08/23 0957  PROBNP 2,878.0* 2,297.0* >4978.0*     D-Dimer No results for input(s): "DDIMER" in the last 72 hours. Hemoglobin A1C No results for input(s): "HGBA1C" in the last 72 hours. Fasting Lipid Panel No results for input(s): "CHOL", "HDL", "LDLCALC", "TRIG", "CHOLHDL", "LDLDIRECT" in the last 72 hours.  Thyroid Function Tests No results for input(s): "TSH", "T4TOTAL", "T3FREE", "THYROIDAB" in the last 72  hours.  Invalid input(s): "FREET3"   Other results:   Imaging    CARDIAC CATHETERIZATION  Result Date: 02/13/2023 Conclusions: Severe three-vessel coronary artery disease, as detailed below. Normal left and right heart filling pressures. Mild-moderately reduced Fick cardiac output/index. Recommendations: Cardiac surgery consultation for CABG +/- MVR.  Patient wishes to discuss with his son before agreeing to transfer. Resume heparin infusion 2 hours after TR band removal. Aggressive secondary prevention of coronary artery disease. Maintain net even fluid balance.  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 Walter Kendall, MD [facility]    Medications:     Scheduled Medications:  aspirin EC  81 mg Oral Daily   atorvastatin  80 mg Oral Daily   sodium chloride flush  3 mL Intravenous Q12H   spironolactone  12.5 mg Oral Daily    Infusions:  sodium  chloride     heparin 1,250 Units/hr (02/14/23 0422)    PRN Medications: sodium chloride, acetaminophen **OR** acetaminophen, ondansetron **OR** ondansetron (ZOFRAN) IV, sodium chloride flush    Patient Profile   Walter Vargas is a 77 year old with a history of HTN, Walter, and recently diagnosed HFrEF.     Admitted with A/C biventricular heart failure & NSTEMI  Assessment/Plan   1. A/C HFrEF, Biventricular  -Recently diagnosed 01/2023 with  global HK,severely reduced LVEF, moderately reduced RV, and mod-severe Walter. CO2 26. No previous cath. HS Trop >13K.  BNP > 4500.  -Euvolemic on exam today; holding off further diuresis. Well compensated.  -S/P RHC/LHC yesterday with low filling pressures and severe multivessel CAD   2. NSTEMI 2/2 multivessel CAD -LHC yesterday w/ severe multivessel CAD; moderately reduced CI at 2L/min/m2 by Fick -I spoke with him at length today regarding risks/benefits of CABG & his son over the phone. They are both agreeable and wish to be transferred to [facility] for further evaluation. Will discuss with hospitalist - He will be moderate to high risk given his biventricular failure albeit with low filling pressures and normal PAPi. I suspect post-op course will also be complicated by some dementia/delirium. He is some what confused on my exam but oriented to person/place/time and has appropriate situational awareness.  -Will start transfer process.    3. Pleural Effusion -Hold further diuresis.    4. Gall Bladder Wall Thickening HIDDA scan- no evidence of cholecystitis.     5. Elevated LFTs AST 124>193 ALT 46>106    6. Mod/Severe Walter - Would plan for intra-op TEE w/ MVR at the time of CABG.    Length of Stay: 4  Walter Poythress, DO  02/14/2023, 10:41 AM  Advanced Heart Failure Team Pager [phone] (M-F; 7a - 5p)  Please contact [facility] for night-coverage after hours (5p -7a ) and weekends on [email or web address]

## 2023-02-14 NOTE — Care Management Important Message (Signed)
Important Message  Patient Details  Name: Walter Vargas MRN: [id number] Date of Birth: 1945-06-08   Important Message Given:  Yes - Medicare IM     [name] 02/14/2023, 12:11 PM

## 2023-02-14 NOTE — Progress Notes (Signed)
  Echocardiogram 2D Echocardiogram has been performed.  Walter Vargas 02/14/2023, 4:59 PM

## 2023-02-14 NOTE — Plan of Care (Signed)
  Problem: Education: Goal: Knowledge of General Education information will improve Description: Including pain rating scale, medication(s)/side effects and non-pharmacologic comfort measures Outcome: Progressing   Problem: Health Behavior/Discharge Planning: Goal: Ability to manage health-related needs will improve Outcome: Progressing   Problem: Clinical Measurements: Goal: Ability to maintain clinical measurements within normal limits will improve Outcome: Progressing Goal: Will remain free from infection Outcome: Progressing Goal: Diagnostic test results will improve Outcome: Progressing Goal: Respiratory complications will improve Outcome: Progressing Goal: Cardiovascular complication will be avoided Outcome: Progressing   Problem: Activity: Goal: Risk for activity intolerance will decrease Outcome: Progressing   Problem: Nutrition: Goal: Adequate nutrition will be maintained Outcome: Progressing   Problem: Coping: Goal: Level of anxiety will decrease Outcome: Progressing   Problem: Elimination: Goal: Will not experience complications related to bowel motility Outcome: Progressing Goal: Will not experience complications related to urinary retention Outcome: Progressing   Problem: Pain Management: Goal: General experience of comfort will improve Outcome: Progressing   Problem: Safety: Goal: Ability to remain free from injury will improve Outcome: Progressing   Problem: Skin Integrity: Goal: Risk for impaired skin integrity will decrease Outcome: Progressing   Problem: Education: Goal: Ability to demonstrate management of disease process will improve Outcome: Progressing Goal: Ability to verbalize understanding of medication therapies will improve Outcome: Progressing Goal: Individualized Educational Video(s) Outcome: Progressing   Problem: Activity: Goal: Capacity to carry out activities will improve Outcome: Progressing   Problem: Cardiac: Goal:  Ability to achieve and maintain adequate cardiopulmonary perfusion will improve Outcome: Progressing   Problem: Education: Goal: Understanding of CV disease, CV risk reduction, and recovery process will improve Outcome: Progressing Goal: Individualized Educational Video(s) Outcome: Progressing   Problem: Activity: Goal: Ability to return to baseline activity level will improve Outcome: Progressing   Problem: Cardiovascular: Goal: Ability to achieve and maintain adequate cardiovascular perfusion will improve Outcome: Progressing Goal: Vascular access site(s) Level 0-1 will be maintained Outcome: Progressing   Problem: Health Behavior/Discharge Planning: Goal: Ability to safely manage health-related needs after discharge will improve Outcome: Progressing

## 2023-02-15 ENCOUNTER — Other Ambulatory Visit (HOSPITAL_COMMUNITY): Payer: 59

## 2023-02-15 DIAGNOSIS — I214 Non-ST elevation (NSTEMI) myocardial infarction: Secondary | ICD-10-CM | POA: Diagnosis not present

## 2023-02-15 DIAGNOSIS — I5023 Acute on chronic systolic (congestive) heart failure: Secondary | ICD-10-CM | POA: Diagnosis not present

## 2023-02-15 DIAGNOSIS — I5021 Acute systolic (congestive) heart failure: Secondary | ICD-10-CM | POA: Diagnosis not present

## 2023-02-15 DIAGNOSIS — I2510 Atherosclerotic heart disease of native coronary artery without angina pectoris: Secondary | ICD-10-CM | POA: Diagnosis not present

## 2023-02-15 DIAGNOSIS — I340 Nonrheumatic mitral (valve) insufficiency: Secondary | ICD-10-CM | POA: Diagnosis not present

## 2023-02-15 LAB — BASIC METABOLIC PANEL
Anion gap: 10 (ref 5–15)
BUN: 22 mg/dL (ref 8–23)
CO2: 24 mmol/L (ref 22–32)
Calcium: 8.5 mg/dL — ABNORMAL LOW (ref 8.9–10.3)
Chloride: 100 mmol/L (ref 98–111)
Creatinine, Ser: 0.85 mg/dL (ref 0.61–1.24)
GFR, Estimated: >60 mL/min (ref >60)
Glucose, Bld: 91 mg/dL (ref 70–99)
Potassium: 3.8 mmol/L (ref 3.5–5.1)
Sodium: 134 mmol/L — ABNORMAL LOW (ref 135–145)

## 2023-02-15 LAB — CBC
HCT: 37.6 % — ABNORMAL LOW (ref 39.0–52.0)
Hemoglobin: 12.2 g/dL — ABNORMAL LOW (ref 13.0–17.0)
MCH: 26.6 pg (ref 26.0–34.0)
MCHC: 32.4 g/dL (ref 30.0–36.0)
MCV: 81.9 fL (ref 80.0–100.0)
Platelets: 199 10*3/uL (ref 150–400)
RBC: 4.59 MIL/uL (ref 4.22–5.81)
RDW: 19.7 % — ABNORMAL HIGH (ref 11.5–15.5)
WBC: 5.9 10*3/uL (ref 4.0–10.5)
nRBC: 0 % (ref 0.0–0.2)

## 2023-02-15 LAB — LIPOPROTEIN A (LPA): Lipoprotein (a): 26.2 nmol/L (ref <75.0)

## 2023-02-15 LAB — HEPARIN LEVEL (UNFRACTIONATED): Heparin Unfractionated: 0.54 [IU]/mL (ref 0.30–0.70)

## 2023-02-15 MED ORDER — FUROSEMIDE 20 MG PO TABS
20.0000 mg | ORAL_TABLET | Freq: Every day | ORAL | Status: DC
  Administered 2023-02-15 – 2023-02-28 (×14): 20 mg via ORAL
  Filled 2023-02-15 (×14): qty 1

## 2023-02-15 NOTE — Progress Notes (Addendum)
Advanced Heart Failure Rounding Note  PCP-Cardiologist: None   Subjective:   Transferred to [facility] for CT surgery consultation for possible CABG   Patient adamant he's going home today. Otherwise feeling ok, sitting up in chair with sneakers on. Denies CP/SOB.   Objective:   Weight Range: 56.5 kg Body mass index is 18.93 kg/m.   Vital Signs:   Temp:  [97.3 F (36.3 C)-97.9 F (36.6 C)] 97.5 F (36.4 C) (12/05 0801) Pulse Rate:  [64-89] 64 (12/05 0801) Resp:  [16-20] 16 (12/05 0801) BP: (89-120)/(56-91) 108/56 (12/05 0801) SpO2:  [98 %-100 %] 98 % (12/05 0435) Weight:  [55.7 kg-56.5 kg] 56.5 kg (12/05 0435) Last BM Date : 02/14/23  Weight change: Filed Weights   02/14/23 1412 02/14/23 1714 02/15/23 0435  Weight: 56.5 kg 55.7 kg 56.5 kg    Intake/Output:   Intake/Output Summary (Last 24 hours) at 02/15/2023 0826 Last data filed at 02/15/2023 0757 Gross per 24 hour  Intake 534.97 ml  Output 200 ml  Net 334.97 ml    Physical Exam  General:  elderly appearing.  No respiratory difficulty HEENT: normal Neck: supple. JVD flat. Carotids 2+ bilat; no bruits. No lymphadenopathy or thyromegaly appreciated. Cor: PMI nondisplaced. Regular rate & rhythm. No rubs, gallops or murmurs. Lungs: clear Abdomen: soft, nontender, nondistended. No hepatosplenomegaly. No bruits or masses. Good bowel sounds. Extremities: no cyanosis, clubbing, rash, edema  Neuro: alert & oriented x 3, cranial nerves grossly intact. moves all 4 extremities w/o difficulty. Affect pleasant.   Telemetry   NSR 70s (Personally reviewed)    EKG    No new EKG to review  Labs    CBC Recent Labs    02/14/23 1814 02/15/23 0304  WBC 5.2 5.9  HGB 12.8* 12.2*  HCT 39.5 37.6*  MCV 81.6 81.9  PLT 212 199   Basic Metabolic Panel Recent Labs    78/46/96 0415 02/13/23 1324 02/14/23 0124 02/14/23 1739 02/15/23 0304  NA 137   < > 134*  --  134*  K 3.8   < > 3.9  --  3.8  CL 102  --  99  --   100  CO2 25  --  26  --  24  GLUCOSE 75  --  95  --  91  BUN 27*  --  24*  --  22  CREATININE 0.88  --  0.87 0.90 0.85  CALCIUM 8.3*  --  8.1*  --  8.5*  MG 2.5*  --  2.3  --   --    < > = values in this interval not displayed.   Liver Function Tests No results for input(s): "AST", "ALT", "ALKPHOS", "BILITOT", "PROT", "ALBUMIN" in the last 72 hours. No results for input(s): "LIPASE", "AMYLASE" in the last 72 hours. Cardiac Enzymes No results for input(s): "CKTOTAL", "CKMB", "CKMBINDEX", "TROPONINI" in the last 72 hours.  BNP: BNP (last 3 results) Recent Labs    02/10/23 1120  BNP >4,500.0*    ProBNP (last 3 results) Recent Labs    10/12/22 1030 10/19/22 0852 01/08/23 0957  PROBNP 2,878.0* 2,297.0* >4978.0*   D-Dimer No results for input(s): "DDIMER" in the last 72 hours. Hemoglobin A1C No results for input(s): "HGBA1C" in the last 72 hours. Fasting Lipid Panel No results for input(s): "CHOL", "HDL", "LDLCALC", "TRIG", "CHOLHDL", "LDLDIRECT" in the last 72 hours. Thyroid Function Tests No results for input(s): "TSH", "T4TOTAL", "T3FREE", "THYROIDAB" in the last 72 hours.  Invalid input(s): "FREET3"  Other results:  Imaging  ECHOCARDIOGRAM COMPLETE  Result Date: 02/14/2023    ECHOCARDIOGRAM REPORT   Patient Name:   ARVAL MCGUINESS Date of Exam: 02/14/2023 Medical Rec #:  [id number]     Height:       71.0 in Accession #:    [id number]    Weight:       124.6 lb Date of Birth:  [birth year]     BSA:          1.724 m Patient Age:    77 years      BP:           120/86 mmHg Patient Gender: M             HR:           78 bpm. Exam Location:  Inpatient Procedure: 2D Echo, Cardiac Doppler and Color Doppler STAT ECHO Indications:    congestive heart failure  History:        Patient has prior history of Echocardiogram examinations, most                 recent 01/16/2023. Signs/Symptoms:Dyspnea and Edema.  Sonographer:    Delcie Roch RDCS Referring Phys: [id number] STEVEN C HENDRICKSON  IMPRESSIONS  1. Left ventricular ejection fraction, by estimation, is <20%. The left ventricle has severely decreased function. The left ventricle demonstrates global hypokinesis. There is mild concentric left ventricular hypertrophy. Left ventricular diastolic parameters are indeterminate.  2. Right ventricular systolic function is moderately reduced. The right ventricular size is normal. There is severely elevated pulmonary artery systolic pressure.  3. Left atrial size was severely dilated.  4. Right atrial size was severely dilated.  5. The mitral valve is normal in structure. Moderate mitral valve regurgitation. No evidence of mitral stenosis.  6. Tricuspid valve regurgitation is moderate.  7. Abdominal aorta is normal sized. The aortic valve has an indeterminant number of cusps. Aortic valve regurgitation is not visualized. No aortic stenosis is present.  8. The inferior vena cava is dilated in size with <50% respiratory variability, suggesting right atrial pressure of 15 mmHg. FINDINGS  Left Ventricle: Left ventricular ejection fraction, by estimation, is <20%. The left ventricle has severely decreased function. The left ventricle demonstrates global hypokinesis. The left ventricular internal cavity size was normal in size. There is mild concentric left ventricular hypertrophy. Left ventricular diastolic parameters are indeterminate. Right Ventricle: The right ventricular size is normal. No increase in right ventricular wall thickness. Right ventricular systolic function is moderately reduced. There is severely elevated pulmonary artery systolic pressure. The tricuspid regurgitant velocity is 3.60 m/s, and with an assumed right atrial pressure of 15 mmHg, the estimated right ventricular systolic pressure is 66.8 mmHg. Left Atrium: Left atrial size was severely dilated. Right Atrium: Right atrial size was severely dilated. Pericardium: There is no evidence of pericardial effusion. Mitral Valve: The mitral  valve is normal in structure. Moderate mitral valve regurgitation. No evidence of mitral valve stenosis. Tricuspid Valve: The tricuspid valve is normal in structure. Tricuspid valve regurgitation is moderate . No evidence of tricuspid stenosis. Aortic Valve: Abdominal aorta is normal sized. The aortic valve has an indeterminant number of cusps. Aortic valve regurgitation is not visualized. No aortic stenosis is present. Pulmonic Valve: The pulmonic valve was not well visualized. Pulmonic valve regurgitation is trivial. No evidence of pulmonic stenosis. Aorta: The aortic root is normal in size and structure. Venous: The inferior vena cava is dilated in size with less than 50% respiratory  variability, suggesting right atrial pressure of 15 mmHg. IAS/Shunts: No atrial level shunt detected by color flow Doppler.  LEFT VENTRICLE PLAX 2D LVIDd:         5.40 cm LVIDs:         5.20 cm LV PW:         1.10 cm LV IVS:        1.10 cm LVOT diam:     1.90 cm LV SV:         24 LV SV Index:   14 LVOT Area:     2.84 cm  RIGHT VENTRICLE          IVC RV Basal diam:  3.30 cm  IVC diam: 2.00 cm TAPSE (M-mode): 1.6 cm LEFT ATRIUM              Index        RIGHT ATRIUM           Index LA diam:        4.50 cm  2.61 cm/m   RA Area:     20.40 cm LA Vol (A2C):   101.0 ml 58.58 ml/m  RA Volume:   63.20 ml  36.66 ml/m LA Vol (A4C):   86.8 ml  50.34 ml/m LA Biplane Vol: 94.5 ml  54.81 ml/m  AORTIC VALVE LVOT Vmax:   57.00 cm/s LVOT Vmean:  35.200 cm/s LVOT VTI:    0.085 m  AORTA Ao Root diam: 3.20 cm Ao Asc diam:  3.40 cm TRICUSPID VALVE TR Peak grad:   51.8 mmHg TR Vmax:        360.00 cm/s  SHUNTS Systemic VTI:  0.08 m Systemic Diam: 1.90 cm Kardie Tobb DO Electronically signed by Thomasene Ripple DO Signature Date/Time: 02/14/2023/5:08:48 PM    Final      Medications:   Scheduled Medications:  aspirin  81 mg Oral Daily   atorvastatin  80 mg Oral QPM   digoxin  0.125 mg Oral Daily   sodium chloride flush  3 mL Intravenous Q12H    spironolactone  25 mg Oral Daily    Infusions:  sodium chloride     heparin 1,250 Units/hr (02/15/23 0600)    PRN Medications: sodium chloride, acetaminophen, ondansetron (ZOFRAN) IV, sodium chloride flush  Patient Profile  [name] is a 77 y.o. history of HTN, MR, HFrEF, and severe 3V CAD.    Transferred to [facility] for CT surgery consultation for possible CABG.   Assessment/Plan  1. CAD: Patient admitted with NSTEMI (?culprit D2) but with severe extensive diffuse 3 vessel disease.  Disease too extensive to treat percutaneously.  Anatomy would be best served by CABG, but CABG would be moderate-high risk given cardiomyopathy.  Echo 12/4 showed mild-moderate RV dysfunction and PAPi was adequate on RHC, so do not think RV function will be a limitation.  Patient seems to be reasonably active at baseline without significant comorbidities. - TCTS consult for high risk CABG.  - Continue heparin gtt for now.  - Continue ASA 81 - Continue statin.  - We could obtain a viability study, but LV walls are not severely thinned and presence/absence of viability has not been shown to have a significant impact on outcome for CABG in heart failure.  2. Acute on chronic systolic CHF: Patient has had HF symptoms for at least a couple of months.  Suspect pre-existing ischemic cardiomyopathy then he presented this admission with NSTEMI possible in D2 territory leading to CHF exacerbation. He has been diuresed,  RHC 12/3 showed normal filling pressures and low CI at 2.0. Echo 12/4: EF 20%, RV mild-moderately dysfunctional, mild-moderate MR.   - Volume stable. Start lasix 20 mg daily (documented as allergy "excessive urination", means its working). Will removed from allergy list.  - Continue digoxin 0.125 daily.  - Continue spironolactone 25 daily.  - Hold on adding SGLT2i with possible procedure. - Suspect patient's best chance to improve LV function will be CABG.  If deemed too high risk, he may be LVAD candidate.   3. Delirium: Patient has had on and off confusion in hospital.  He would be at risk for worsening post-op delirium in setting of CABG.  4. Gallbladder disease: Patient had gallstones but HIDA scan not suggestive of cholecystitis.  Patient adamant about going home today "now". Long discussion on why he needs to stay. Reached out to TCTS to follow up on timing of consult. Also called his son Markham Jordan to explain the situation this morning, he will call his dad and try to get him to stay. Markham Jordan also coming up a little later today.   Length of Stay: 1  Alen Bleacher, NP  02/15/2023, 8:26 AM  Advanced Heart Failure Team Pager [phone] (M-F; 7a - 5p)  Please contact [facility] for night-coverage after hours (5p -7a ) and weekends on [email or web address]   Patient seen with NP, agree with the above note.   No chest pain or dyspnea.  Has walked in hall.  I saw him today with his son who is here from [place].   General: NAD Neck: No JVD, no thyromegaly or thyroid nodule.  Lungs: Clear to auscultation bilaterally with normal respiratory effort. CV: Nondisplaced PMI.  Heart regular S1/S2, no S3/S4, no murmur.  No peripheral edema.   Abdomen: Soft, nontender, no hepatosplenomegaly, no distention.  Skin: Intact without lesions or rashes.  Neurologic: Alert and oriented x 3.  Psych: Normal affect. Extremities: No clubbing or cyanosis.  HEENT: Normal.   Stable currently.  Severe 3VD, presented with NSTEMI.  He does not have good targets for PCI.  I reviewed yesterday's echo, EF 20%, RV mild-moderately dysfunctional, mild-moderate MR.  He looks optimized from volume standpoint.  - Start Lasix 20 mg po daily.  - Continue spironolactone and digoxin 0.125.  - Will review options with TCTS.  I do not think that the mitral valve would need repair.  CABG would be moderate-high risk with low EF, risk of mortality 7% by STS calculator.  Long-term outcome would be poor with low EF and nonrevascularized severe CAD.  At  baseline, he lives alone independently and drives.  He does seem to have mild dementia. I do not think a viability study would add much here, LV walls are not thinned and presence/absence of viability has not been shown to have a significant impact on outcome for CABG in heart failure.   Marca Ancona 02/15/2023 11:49 AM

## 2023-02-15 NOTE — Consult Note (Addendum)
[address]       [place] [address]             [id number]        [name] [facility] Medical Record #[id number] Date of Birth: 03/10/1946  Referring: Leeroy Bock, MD Primary Care: [name], NP Primary Cardiologist:None  Reason for Consult: Evaluation for potential coronary bypass grafting for severe 3V CAD presenting with acute NSTEMI and severe LV dysfynction.   History of Present Illness:     [name]. 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  He has a history of known congestive heart failure and cardiomyopathy based on echocardiogram in early November of this year.  His ejection fraction was estimated at less than 20%.  He was referred to cardiologist but delayed follow-up.  [name] son came into town last week with plans to meet him for breakfast but [name] did not arrive is planned.  When the son went to [name]'s home to check on him, he found him confused and short of breath.  Walter Vargas was taken to the emergency room at [facility] where he firmed having shortness of breath with activity for the previous couple of months along with vague chest and abdominal pain.  In the ED, his vital signs were within normal limits and he was in a stable sinus rhythm.  Physical exam was notable labored respirations and lower extremity pitting edema.  Initial high-sensitivity troponin was greater than 13,000 and BNP was greater than 4500.  EKG showed left bundle branch block with no acute ST changes.  CT head scan was done due to the recent onset of confusion which was a new symptom.  CT head showed no acute abnormality.  CTA of the chest was notable for pulmonary edema and small pleural effusions.  CT of the abdomen and pelvis showed a distended gallbladder but was otherwise  unremarkable.  This finding was followed up with an eminent an abdominal ultrasound showing thickened gallbladder wall with stones and sludge but no ductal dilatation.  A subsequent HIDA scan has ruled out cholecystitis.  [name] was admitted to the hospital with acute non-ST elevation myocardial infarction in the setting of acute on chronic congestive heart failure.  He was started on aspirin, heparin infusion, and was diuresed with double improvement of his breathing and resolution of lower extremity edema.  Cardiology left heart catheterization was recommended and completed yesterday showing severe three-vessel coronary artery disease repeat transthoracic echocardiogram also obtained yesterday confirmed left ventricular ejection fraction of less than 20% with global hypokinesis and mild concentric LV hypertrophy.  RV function was moderately reduced.  There was moderate mitral insufficiency and moderate tricuspid insufficiency.  No significant AI or AS.  Pulmonary artery systolic pressure was elevated with an estimated RV systolic pressure of 66 mmHg.  [name] is sitting in the bedside chair with his son accompanying him.  [name] is she is in insist that he needs to go home today " to think things over".  He lives independently in an apartment in [place].  He said his appetite has not been as good lately and he has lost some weight unintentionally.  He denies ever having any significant episodes of chest pain has been primarily limited by the shortness of breath exertion.  Zubrod Score: At the time of surgery this patient's most appropriate activity status/level should be described as: []     0    Normal activity, no symptoms []     1    Restricted in physical strenuous activity but ambulatory, able to do out light work []     2    Ambulatory and capable of self care, unable to do work activities, up and about                 more than 50%  Of the time                            [x]     3     Only limited self care, in bed greater than 50% of waking hours []     4    Completely disabled, no self care, confined to bed or chair []     5    Moribund  Past Medical History:  Diagnosis Date   Essential hypertension    HFrEF (heart failure with reduced ejection fraction) (HCC)    Mitral regurgitation     Past Surgical History:  Procedure Laterality Date   NO PAST SURGERIES     RIGHT/LEFT HEART CATH AND CORONARY ANGIOGRAPHY N/A 02/13/2023   Procedure: RIGHT/LEFT HEART CATH AND CORONARY ANGIOGRAPHY;  Surgeon: Yvonne Kendall, MD;  Location: [facility] INVASIVE CV LAB;  Service: Cardiovascular;  Laterality: N/A;    Social History   Tobacco Use  Smoking Status Former   Current packs/day: 0.00   Average packs/day: 0.5 packs/day for 10.0 years (5.0 ttl pk-yrs)   Types: Cigarettes   Start date: 38   Quit date: 66   Years since quitting: 46.9  Smokeless Tobacco Never    Social History   Substance and Sexual Activity  Alcohol Use Not Currently   Comment: no alcohol since 77 years old     No Active Allergies  Current Facility-Administered Medications  Medication Dose Route Frequency Provider Last Rate Last Admin   0.9 %  sodium chloride infusion  250 mL Intravenous PRN Andrey Farmer, PA-C       acetaminophen (TYLENOL) tablet 650 mg  650 mg Oral Q4H PRN Andrey Farmer, PA-C       aspirin chewable tablet 81 mg  81 mg Oral Daily Andrey Farmer, [place]   81 mg at 02/15/23 0859   atorvastatin (LIPITOR) tablet 80 mg  80 mg Oral QPM Andrey Farmer, PA-C       digoxin Margit Banda) tablet 0.125 mg  0.125 mg Oral Daily Laurey Morale, MD   0.125 mg at 02/15/23 9528   furosemide (LASIX) tablet 20 mg  20 mg Oral Daily Brynda Peon L, NP       heparin ADULT infusion 100 units/mL (25000 units/244mL)  1,250 Units/hr Intravenous Continuous Synetta Fail, MD 12.5 mL/hr at 02/15/23 0600 1,250 Units/hr at 02/15/23 0600   ondansetron (ZOFRAN) injection 4 mg  4 mg  Intravenous Q6H PRN Andrey Farmer, PA-C       sodium chloride flush (NS) 0.9 % injection 3 mL  3 mL Intravenous Q12H Andrey Farmer, PA-C       sodium chloride flush (NS) 0.9 % injection 3 mL  3 mL Intravenous PRN Andrey Farmer, PA-C       spironolactone (ALDACTONE) tablet 25 mg  25 mg Oral Daily Laurey Morale, MD   25 mg at 02/15/23 [phone]  Medications Prior to Admission  Medication Sig Dispense Refill Last Dose   aspirin EC 81 MG tablet Take 81 mg by mouth daily.   Past Week   metoprolol succinate (TOPROL-XL) 25 MG 24 hr tablet Take 25 mg by mouth daily.   Past Week   Multiple Vitamins-Minerals (MULTIVITAMIN MEN 50+) TABS Take 1 tablet by mouth daily.   Past Week   atorvastatin (LIPITOR) 80 MG tablet Take 1 tablet (80 mg total) by mouth daily. (Patient not taking: Reported on 02/14/2023)   Not Taking   furosemide (LASIX) 20 MG tablet Take 20 mg by mouth daily. (Patient not taking: Reported on 02/14/2023)   Not Taking   heparin 65784 UT/250ML infusion Inject 1,250 Units/hr into the vein continuous. (Patient not taking: Reported on 02/14/2023)   Not Taking   spironolactone (ALDACTONE) 25 MG tablet Take 0.5 tablets (12.5 mg total) by mouth daily. (Patient not taking: Reported on 02/14/2023)   Not Taking    Family History  Problem Relation Age of Onset   Diabetes Mother        gestational diabetes   Hypertension Father    Alcohol abuse Father    Heart disease Brother    Alcohol abuse Brother    Drug abuse Brother      Review of Systems:       Cardiac Review of Systems: Y or  [    ]= no  Chest Pain [    ]  Resting SOB [occasional] Exertional SOB  [frequent]  Orthopnea [  ]   Pedal Edema [present on admission, resolved]    Palpitations [  ] Syncope  [  ]   Presyncope [   ]  General Review of Systems: [Y] = yes [  ]=no Constitional: recent weight change [recent unintentional weight loss]; anorexia [  ]; fatigue [  ]; nausea [  ]; night sweats [  ]; fever [  ]; or  chills [  ]                                                               Dental: Last Dentist visit: About 6 months ago, scheduled for visit in January  Eye : blurred vision [  ]; diplopia [   ]; vision changes [  ];  Amaurosis fugax[  ]; Resp: cough [  ];  wheezing[  ];  hemoptysis[  ]; shortness of breath[ x ]; paroxysmal nocturnal dyspnea[  ]; dyspnea on exertion[x  ]; or orthopnea[  ];  GI:  gallstones[ x ], vomiting[  ];  dysphagia[  ]; melena[  ];  hematochezia [  ]; heartburn[  ];   Hx of  Colonoscopy[  ]; GU: kidney stones [  ]; hematuria[  ];   dysuria [  ];  nocturia[  ];  history of     obstruction [  ]; urinary frequency [  ]             Skin: rash, swelling[  ];, hair loss[  ];  peripheral edema[ x,  ];  or itching[  ]; Musculosketetal: myalgias[  ];  joint swelling[  ];  joint now resolved erythema[  ];  joint pain[  ];  back pain[  ];  Heme/Lymph: bruising[  ];  bleeding[  ];  anemia[  ];  Neuro: TIA[  ];  headaches[  ];  stroke[  ];  vertigo[  ];  seizures[  ];   paresthesias[  ];  difficulty walking[ x ];  Psych:depression[  ]; anxiety[  ];  Endocrine: diabetes[  ];  thyroid dysfunction[  ];                  Physical Exam: BP (!) 108/56 (BP Location: Right Arm)   Pulse 64   Temp (!) 97.5 F (36.4 C) (Axillary)   Resp 16   Ht 5\' 8"  (1.727 m) Comment: compared to tech's height  Wt 56.5 kg   SpO2 98%   BMI 18.93 kg/m    General appearance: alert, cooperative, no distress, and anxious Head: Normocephalic, without obvious abnormality, atraumatic Neck: no adenopathy, no carotid bruit, no JVD, supple, symmetrical, trachea midline, and there is a 1-1/2 cm cystic mass on the right side of the neck just below the angle of the jaw (he said this has been there for years and has not changed in character or size) Lymph nodes: No cervical or clavicular adenopathy Resp: Respirations are shallow but clear.  He has normal respiratory effort at rest.  Oxygen saturation is  satisfactory on room air Cardio: Regular rate and rhythm, monitor shows normal sinus rhythm.  I did not hear a murmur GI: Soft, nontender.  There is an obvious circular 3 cm umbilical hernia that is noninflamed and not tender Extremities: Thin, no deformities, all well-perfused with palpable distal pulses.  No significant varicosities in the lower extremities Neurologic: Anxious about being in the hospital and insistent that he needs to go home but he was cooperative and answered questions appropriately.  Diagnostic Studies & Laboratory data:    RIGHT/LEFT HEART CATH AND CORONARY ANGIOGRAPHY   Conclusion  Conclusions: Severe three-vessel coronary artery disease, as detailed below. Normal left and right heart filling pressures. Mild-moderately reduced Fick cardiac output/index.   Recommendations: Cardiac surgery consultation for CABG +/- MVR.  Patient wishes to discuss with his son before agreeing to transfer. Resume heparin infusion 2 hours after TR band removal. Aggressive secondary prevention of coronary artery disease. Maintain net even fluid balance.  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   Yvonne Kendall, MD [facility]  Coronary Findings  Diagnostic Dominance: Right Left Main  Vessel is moderate in size.  Mid LM to Dist LM lesion is 45% stenosed.    Left Anterior Descending  Vessel is moderate in size.  Prox LAD to Mid LAD lesion is 95% stenosed.  Mid LAD lesion is 70% stenosed.  Dist LAD lesion is 80% stenosed.    First Diagonal Branch  Vessel is moderate in size.  1st Diag lesion is 60% stenosed.    Second Diagonal Branch  Vessel is moderate in size.  2nd Diag lesion is 99% stenosed.    Ramus Intermedius  Ramus lesion is 50% stenosed.    Left Circumflex  Vessel is moderate in size.  Ost Cx to Prox Cx lesion is 60% stenosed.    First Obtuse Marginal Branch  Vessel is small in size.    Second  Obtuse Marginal Branch  Vessel is moderate in size.  2nd Mrg-1 lesion is 60% stenosed.  2nd Mrg-2 lesion is 80% stenosed.    Third Obtuse Marginal Branch  Vessel is small in size.    Right Coronary Artery  Vessel is large.  Prox RCA lesion is 50% stenosed.  Mid  RCA lesion is 99% stenosed. The lesion is focal.    Right Posterior Descending Artery  Vessel is moderate in size.  RPDA lesion is 70% stenosed.    Right Posterior Atrioventricular Artery  Vessel is moderate in size.  RPAV lesion is 30% stenosed.    First Right Posterolateral Branch  Vessel is small in size.    Second Right Posterolateral Branch  Vessel is small in size.    Third Right Posterolateral Branch  Vessel is moderate in size.  3rd RPL lesion is 80% stenosed.    Intervention   No interventions have been documented.   Right Heart  Right Heart Pressures RA (mean): 2 mmHg RV (S/EDP): 27/2 mmHg PA (S/D, mean): 25/10 (15) mmHg PCWP (mean): 12 mmHg  Ao sat: 95% PA sat: 59%  Fick CO: 3.5 L/min Fick CI: 2.0 L/min/m^2   Left Heart  Left Ventricle LV end diastolic pressure is normal. LVEDP 15 mmHg.  Aortic Valve There is no aortic valve stenosis.   Coronary Diagrams  Diagnostic Dominance: Right    ECHOCARDIOGRAM REPORT    Patient Name:   LADDIE WOODKE Date of Exam: 02/14/2023  Medical Rec #:  [id number]     Height:       71.0 in  Accession #:    [id number]    Weight:       124.6 lb  Date of Birth:  [birth year]     BSA:          1.724 m  Patient Age:    77 years      BP:           120/86 mmHg  Patient Gender: M             HR:           78 bpm.  Exam Location:  Inpatient   Procedure: 2D Echo, Cardiac Doppler and Color Doppler   STAT ECHO   Indications:    congestive heart failure    History:        Patient has prior history of Echocardiogram examinations,  most                 recent 01/16/2023. Signs/Symptoms:Dyspnea and Edema.    Sonographer:    Delcie Roch RDCS  Referring Phys:  [id number]   IMPRESSIONS     1. Left ventricular ejection fraction, by estimation, is <20%. The left  ventricle has severely decreased function. The left ventricle demonstrates  global hypokinesis. There is mild concentric left ventricular hypertrophy.  Left ventricular diastolic  parameters are indeterminate.   2. Right ventricular systolic function is moderately reduced. The right  ventricular size is normal. There is severely elevated pulmonary artery  systolic pressure.   3. Left atrial size was severely dilated.   4. Right atrial size was severely dilated.   5. The mitral valve is normal in structure. Moderate mitral valve  regurgitation. No evidence of mitral stenosis.   6. Tricuspid valve regurgitation is moderate.   7. Abdominal aorta is normal sized. The aortic valve has an indeterminant  number of cusps. Aortic valve regurgitation is not visualized. No aortic  stenosis is present.   8. The inferior vena cava is dilated in size with <50% respiratory  variability, suggesting right atrial pressure of 15 mmHg.   FINDINGS   Left Ventricle: Left ventricular ejection fraction, by estimation, is  <20%. The left ventricle has severely decreased function. The left  ventricle demonstrates global  hypokinesis. The left ventricular internal  cavity size was normal in size. There is  mild concentric left ventricular hypertrophy. Left ventricular diastolic  parameters are indeterminate.   Right Ventricle: The right ventricular size is normal. No increase in  right ventricular wall thickness. Right ventricular systolic function is  moderately reduced. There is severely elevated pulmonary artery systolic  pressure. The tricuspid regurgitant  velocity is 3.60 m/s, and with an assumed right atrial pressure of 15  mmHg, the estimated right ventricular systolic pressure is 66.8 mmHg.   Left Atrium: Left atrial size was severely dilated.   Right Atrium: Right atrial size  was severely dilated.   Pericardium: There is no evidence of pericardial effusion.   Mitral Valve: The mitral valve is normal in structure. Moderate mitral  valve regurgitation. No evidence of mitral valve stenosis.   Tricuspid Valve: The tricuspid valve is normal in structure. Tricuspid  valve regurgitation is moderate . No evidence of tricuspid stenosis.   Aortic Valve: Abdominal aorta is normal sized. The aortic valve has an  indeterminant number of cusps. Aortic valve regurgitation is not  visualized. No aortic stenosis is present.   Pulmonic Valve: The pulmonic valve was not well visualized. Pulmonic valve  regurgitation is trivial. No evidence of pulmonic stenosis.   Aorta: The aortic root is normal in size and structure.   Venous: The inferior vena cava is dilated in size with less than 50%  respiratory variability, suggesting right atrial pressure of 15 mmHg.   IAS/Shunts: No atrial level shunt detected by color flow Doppler.     LEFT VENTRICLE  PLAX 2D  LVIDd:         5.40 cm  LVIDs:         5.20 cm  LV PW:         1.10 cm  LV IVS:        1.10 cm  LVOT diam:     1.90 cm  LV SV:         24  LV SV Index:   14  LVOT Area:     2.84 cm     RIGHT VENTRICLE          IVC  RV Basal diam:  3.30 cm  IVC diam: 2.00 cm  TAPSE (M-mode): 1.6 cm   LEFT ATRIUM              Index        RIGHT ATRIUM           Index  LA diam:        4.50 cm  2.61 cm/m   RA Area:     20.40 cm  LA Vol (A2C):   101.0 ml 58.58 ml/m  RA Volume:   63.20 ml  36.66 ml/m  LA Vol (A4C):   86.8 ml  50.34 ml/m  LA Biplane Vol: 94.5 ml  54.81 ml/m   AORTIC VALVE  LVOT Vmax:   57.00 cm/s  LVOT Vmean:  35.200 cm/s  LVOT VTI:    0.085 m    AORTA  Ao Root diam: 3.20 cm  Ao Asc diam:  3.40 cm   TRICUSPID VALVE  TR Peak grad:   51.8 mmHg  TR Vmax:        360.00 cm/s    SHUNTS  Systemic VTI:  0.08 m  Systemic Diam: 1.90 cm   Kardie Tobb DO  Electronically signed by Thomasene Ripple DO  Signature  Date/Time: 02/14/2023/5:08:48 PM  Recent Radiology Findings:   CLINICAL DATA:  Chest pain   EXAM: CHEST - 2 VIEW   COMPARISON:  Chest radiograph dated 10/12/2022   FINDINGS: Normal lung volumes. Bibasilar patchy opacities. Small right and moderate left pleural effusions. No pneumothorax. Similar cardiomediastinal silhouette. No acute osseous abnormality.   IMPRESSION: 1. Small right and moderate left pleural effusions. 2. Bibasilar patchy opacities, likely atelectasis. Aspiration or pneumonia can be considered in the appropriate clinical setting.     Electronically Signed   By: Agustin Cree M.D.   On: 02/10/2023 12:12    CLINICAL DATA:  Shortness of breath, hypoxia.   EXAM: CT ANGIOGRAPHY CHEST WITH CONTRAST   TECHNIQUE: Multidetector CT imaging of the chest was performed using the standard protocol during bolus administration of intravenous contrast. Multiplanar CT image reconstructions and MIPs were obtained to evaluate the vascular anatomy.   RADIATION DOSE REDUCTION: This exam was performed according to the departmental dose-optimization program which includes automated exposure control, adjustment of the mA and/or kV according to patient size and/or use of iterative reconstruction technique.   CONTRAST:  OMNIPAQUE IOHEXOL 350 MG/ML SOLN   COMPARISON:  None Available.   FINDINGS: Cardiovascular: Satisfactory opacification of the pulmonary arteries to the segmental level. No evidence of pulmonary embolism. Normal heart size. No pericardial effusion.   Mediastinum/Nodes: No enlarged mediastinal, hilar, or axillary lymph nodes. Thyroid gland, trachea, and esophagus demonstrate no significant findings.   Lungs/Pleura: Moderate size right pleural effusion is noted with small left pleural effusion, but no pneumothorax is noted. Right middle lobe opacity is noted concerning for atelectasis or pneumonia. Mild left basilar opacity is noted most consistent  with atelectasis or possibly infiltrate.   Upper Abdomen: No acute abnormality.   Musculoskeletal: No chest wall abnormality. No acute or significant osseous findings.   Review of the MIP images confirms the above findings.   IMPRESSION: No definite evidence of pulmonary embolus.   Moderate size right pleural effusion is noted with small left pleural effusion.   Right middle lobe opacity is noted concerning for atelectasis or pneumonia. Mild left basilar opacity is noted most consistent with atelectasis or infiltrate.   Aortic Atherosclerosis (ICD10-I70.0).     Electronically Signed   By: Lupita Raider M.D.   On: 02/10/2023 13:14   CLINICAL DATA:  Epigastric abdominal pain.   EXAM: CT ABDOMEN AND PELVIS WITH CONTRAST   TECHNIQUE: Multidetector CT imaging of the abdomen and pelvis was performed using the standard protocol following bolus administration of intravenous contrast.   RADIATION DOSE REDUCTION: This exam was performed according to the departmental dose-optimization program which includes automated exposure control, adjustment of the mA and/or kV according to patient size and/or use of iterative reconstruction technique.   CONTRAST:  OMNIPAQUE IOHEXOL 350 MG/ML SOLN   COMPARISON:  None Available.   FINDINGS: Lower chest: Bilateral pleural effusions are noted, right greater than left.   Hepatobiliary: Moderate gallbladder wall thickening is noted without cholelithiasis. No biliary dilatation is noted. Probable 18 mm cyst seen in left hepatic lobe.   Pancreas: Unremarkable. No pancreatic ductal dilatation or surrounding inflammatory changes.   Spleen: Normal in size without focal abnormality.   Adrenals/Urinary Tract: Adrenal glands are unremarkable. Kidneys are normal, without renal calculi, focal lesion, or hydronephrosis. Bladder is unremarkable.   Stomach/Bowel: Stomach is within normal limits. Appendix appears normal. No evidence of  bowel wall thickening, distention, or inflammatory changes.   Vascular/Lymphatic: Aortic atherosclerosis. No enlarged abdominal or pelvic lymph nodes.  Reproductive: Prostate is unremarkable.   Other: Moderate size fat containing periumbilical hernia is noted. Minimal free fluid is noted in the pelvis.   Musculoskeletal: Severe degenerative changes seen involving the left hip. L2 level degenerative disc disease is noted in lumbar spine. No acute osseous abnormality is noted.   IMPRESSION: Moderate gallbladder wall thickening is noted without cholelithiasis. Ultrasound or HIDA scan is recommended to evaluate for acalculous cholecystitis.   Moderate bilateral pleural effusions.   Moderate size fat containing periumbilical hernia.   Minimal free fluid is noted in the pelvis of uncertain etiology.   Aortic Atherosclerosis (ICD10-I70.0).     Electronically Signed   By: Lupita Raider M.D.   On: 02/10/2023 13:34      I have independently reviewed the above radiologic studies and discussed with the patient   Recent Lab Findings: Lab Results  Component Value Date   WBC 5.9 02/15/2023   HGB 12.2 (L) 02/15/2023   HCT 37.6 (L) 02/15/2023   PLT 199 02/15/2023   GLUCOSE 91 02/15/2023   CHOL 90 02/11/2023   TRIG 45 02/11/2023   HDL 38 (L) 02/11/2023   LDLCALC 43 02/11/2023   ALT 106 (H) 02/11/2023   AST 193 (H) 02/11/2023   NA 134 (L) 02/15/2023   K 3.8 02/15/2023   CL 100 02/15/2023   CREATININE 0.85 02/15/2023   BUN 22 02/15/2023   CO2 24 02/15/2023   TSH 4.481 02/11/2023   INR 1.4 (H) 02/10/2023      Assessment / Plan:      -Severe three-vessel coronary artery disease with moderate mitral insufficiency and a 76 year old male presenting with acute non-ST elevation myocardial infarction in the setting of acute on chronic systolic congestive heart failure.  His ejection fraction is less than 20%.  He has been hospitalized since 02/10/2023 with significant  improvement in his resting respiratory status with diuresis.  He denies having any chest pain either prior to admission or since admission.  He is currently on heparin infusion, Lasix, digoxin, and spironolactone. The STS risk calculator places the operative mortality at 7.09% for isolated CABG with combined morbidity and mortality risk of 17.7% Dr. Dorris Fetch is aware of Mr. Pearcy's hospital admission and findings of the workup so far.  He will review further and comment regarding risks versus benefits of surgery.  -History of hypertension: Blood pressure is well-controlled on the current setting.  -Cholelithiasis without acute cholecystitis based on abdominal ultrasound and HIDA scan this admission.  -Acute kidney injury-present on admission and likely related to hypoperfusion.  Creatinine has normalized  -Umbilical hernia: Chronic, no evidence of incarceration.  I  spent 30 minutes counseling the patient face to face.   Leary Roca, PA-C  02/15/2023 10:08 AM  Patient seen and examined, agree with above.  I have reviewed Mr. Weltman's records and personally reviewed his cath and echo images.  77 yo man presented with confusion and shortness of breath.  Admitted with decompensated heart failure. W/u revealed EF 10% with severe MR,  cath showed severe 3 vessel CAD. CI 2.0 PA 25/10 PCWP 12. Repeat echo- EF 20%, moderate MR.  Has had issues with confusion while in hospital and has threatedned to leave earlier today.  Currently calm and cooperative, but anxious about not having son present.  CABG is indicated for survival benefit and relief of symptoms.  I think the STS risk calculator severely underestimates the risk in this case with his confusion making delirium a significant issue postoperatively.  May  not require MVR but final decision would be made in OR.  I discussed the proposed operative procedure with him.  We discussed the need for general anesthesia, the incisions to be  used, the use of cardiopulmonary bypass, and the need for drainage tubes and temporary pacemaker wires postoperatively.  We discussed the expected hospital stay, overall recovery and short and long term outcomes. I informed him of the indications, risks, benefits and alternatives.  He understands the risks include, but are not limited to death, stroke, MI, DVT/PE, bleeding, possible need for transfusion, infections, cardiac arrhythmias, possible need for mechanical assist device, as well as other organ system dysfunction including respiratory, renal, or GI complications.   He wants his son to make his decisions for him.  I am happy to speak with his son.  Salvatore Decent Dorris Fetch, MD [facility] [phone]

## 2023-02-15 NOTE — Progress Notes (Signed)
Patient's son is requesting a call from Dr. Dorris Fetch as soon as possible tomorrow. He states that he is requesting to know timing of surgery. Phone number is listed under patient's emergency contact information.

## 2023-02-15 NOTE — Progress Notes (Signed)
PHARMACY - ANTICOAGULATION CONSULT NOTE  Pharmacy Consult for Heparin Indication: NSTEMI, multi-vessel CAD  Allergies  Allergen Reactions   Lasix [Furosemide] Other (See Comments)    Excessive urination    Patient Measurements: Height: 68"  Weight: 55.7 kg (initial weight 56.5 kg, repeated due to weight 63.7 kg at [facility]) - lasix 60 IV q12h x 4 doses given at [facility] 12/1>>12/3 IBW/kg: 68.4 kg Heparin Dosing Weight: 55.7 kg   Vital Signs: Temp: 97.3 F (36.3 C) (12/05 0435) Temp Source: Oral (12/05 0435) BP: 105/91 (12/05 0435) Pulse Rate: 80 (12/05 0435)  Labs: Recent Labs    02/14/23 0124 02/14/23 0950 02/14/23 1739 02/14/23 1814 02/15/23 0304  HGB 12.7*  --   --  12.8* 12.2*  HCT 38.8*  --   --  39.5 37.6*  PLT 213  --   --  212 199  HEPARINUNFRC 0.40 0.57  --   --  0.54  CREATININE 0.87  --  0.90  --  0.85    Estimated Creatinine Clearance: 58.2 mL/min (by C-G formula based on SCr of 0.85 mg/dL).   Medical History: Past Medical History:  Diagnosis Date   Essential hypertension    HFrEF (heart failure with reduced ejection fraction) (HCC)    Mitral regurgitation     Assessment: Patient is a 77 y.o. male who presented to the ED on 02/10/2023 with SOB and chest pain. PMH includes HFrEF with EF < 20 RV mod reduced and mildly enlarged, mod pulm artery pressure, LA mod dilated, RA mildly dilated, mod pleural effusion, severe MVR. On admission BNP > 4,500 and troponins 13,606. 12/3 taken for Right/Left heart cath and coronary angiography. Showed severe three-vessel disease. Post procedure recommends CABG +/- MVR. Heparin infusion resumed 2 hours after TR band removal post-procedure. Pharmacy consulted to dose and manage heparin.   Transferred from [facility] to [facility] for TCTS evaluation.  Heparin level 0.54 is therapeutic on 1250 units/hr.  CBC stable.  Goal of Therapy:  Heparin level 0.3-0.7 units/ml Monitor platelets by anticoagulation protocol: Yes   Plan:  Continue  heparin drip at 1250 units/hr Daily heparin level and CBC. Follow up TCTS consult.  Trixie Rude, PharmD Clinical Pharmacist 02/15/2023  7:23 AM

## 2023-02-15 NOTE — H&P (View-Only) (Signed)
 [address]       [place] [address]             [id number]        [name] [facility] Medical Record #[id number] Date of Birth: 03/10/1946  Referring: Leeroy Bock, MD Primary Care: [name], NP Primary Cardiologist:None  Reason for Consult: Evaluation for potential coronary bypass grafting for severe 3V CAD presenting with acute NSTEMI and severe LV dysfynction.   History of Present Illness:     [name] 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  He has a history of known congestive heart failure and cardiomyopathy based on echocardiogram in early November of this year.  His ejection fraction was estimated at less than 20%.  He was referred to cardiologist but delayed follow-up.  [name] son came into town last week with plans to meet him for breakfast but [name] did not arrive is planned.  When the son went to [name] home to check on him, he found him confused and short of breath.  Stryffeler was taken to the emergency room at [facility] where he firmed having shortness of breath with activity for the previous couple of months along with vague chest and abdominal pain.  In the ED, his vital signs were within normal limits and he was in a stable sinus rhythm.  Physical exam was notable labored respirations and lower extremity pitting edema.  Initial high-sensitivity troponin was greater than 13,000 and BNP was greater than 4500.  EKG showed left bundle branch block with no acute ST changes.  CT head scan was done due to the recent onset of confusion which was a new symptom.  CT head showed no acute abnormality.  CTA of the chest was notable for pulmonary edema and small pleural effusions.  CT of the abdomen and pelvis showed a distended gallbladder but was otherwise  unremarkable.  This finding was followed up with an eminent an abdominal ultrasound showing thickened gallbladder wall with stones and sludge but no ductal dilatation.  A subsequent HIDA scan has ruled out cholecystitis.  Mr. Hosp was admitted to the hospital with acute non-ST elevation myocardial infarction in the setting of acute on chronic congestive heart failure.  He was started on aspirin, heparin infusion, and was diuresed with double improvement of his breathing and resolution of lower extremity edema.  Cardiology left heart catheterization was recommended and completed yesterday showing severe three-vessel coronary artery disease repeat transthoracic echocardiogram also obtained yesterday confirmed left ventricular ejection fraction of less than 20% with global hypokinesis and mild concentric LV hypertrophy.  RV function was moderately reduced.  There was moderate mitral insufficiency and moderate tricuspid insufficiency.  No significant AI or AS.  Pulmonary artery systolic pressure was elevated with an estimated RV systolic pressure of 66 mmHg.  [name] is sitting in the bedside chair with his son accompanying him.  Mr. Leonel Ramsay is she is in insist that he needs to go home today " to think things over".  He lives independently in an apartment in [place].  He said his appetite has not been as good lately and he has lost some weight unintentionally.  He denies ever having any significant episodes of chest pain has been primarily limited by the shortness of breath exertion.  Zubrod Score: At the time of surgery this patient's most appropriate activity status/level should be described as: []     0    Normal activity, no symptoms []     1    Restricted in physical strenuous activity but ambulatory, able to do out light work []     2    Ambulatory and capable of self care, unable to do work activities, up and about                 more than 50%  Of the time                            [x]     3     Only limited self care, in bed greater than 50% of waking hours []     4    Completely disabled, no self care, confined to bed or chair []     5    Moribund  Past Medical History:  Diagnosis Date   Essential hypertension    HFrEF (heart failure with reduced ejection fraction) (HCC)    Mitral regurgitation     Past Surgical History:  Procedure Laterality Date   NO PAST SURGERIES     RIGHT/LEFT HEART CATH AND CORONARY ANGIOGRAPHY N/A 02/13/2023   Procedure: RIGHT/LEFT HEART CATH AND CORONARY ANGIOGRAPHY;  Surgeon: Yvonne Kendall, MD;  Location: [facility] INVASIVE CV LAB;  Service: Cardiovascular;  Laterality: N/A;    Social History   Tobacco Use  Smoking Status Former   Current packs/day: 0.00   Average packs/day: 0.5 packs/day for 10.0 years (5.0 ttl pk-yrs)   Types: Cigarettes   Start date: 38   Quit date: 66   Years since quitting: 46.9  Smokeless Tobacco Never    Social History   Substance and Sexual Activity  Alcohol Use Not Currently   Comment: no alcohol since 77 years old     No Active Allergies  Current Facility-Administered Medications  Medication Dose Route Frequency Provider Last Rate Last Admin   0.9 %  sodium chloride infusion  250 mL Intravenous PRN Andrey Farmer, PA-C       acetaminophen (TYLENOL) tablet 650 mg  650 mg Oral Q4H PRN Andrey Farmer, PA-C       aspirin chewable tablet 81 mg  81 mg Oral Daily Andrey Farmer, [place]   81 mg at 02/15/23 0859   atorvastatin (LIPITOR) tablet 80 mg  80 mg Oral QPM Andrey Farmer, PA-C       digoxin Margit Banda) tablet 0.125 mg  0.125 mg Oral Daily Laurey Morale, MD   0.125 mg at 02/15/23 9528   furosemide (LASIX) tablet 20 mg  20 mg Oral Daily Brynda Peon L, NP       heparin ADULT infusion 100 units/mL (25000 units/244mL)  1,250 Units/hr Intravenous Continuous Synetta Fail, MD 12.5 mL/hr at 02/15/23 0600 1,250 Units/hr at 02/15/23 0600   ondansetron (ZOFRAN) injection 4 mg  4 mg  Intravenous Q6H PRN Andrey Farmer, PA-C       sodium chloride flush (NS) 0.9 % injection 3 mL  3 mL Intravenous Q12H Andrey Farmer, PA-C       sodium chloride flush (NS) 0.9 % injection 3 mL  3 mL Intravenous PRN Andrey Farmer, PA-C       spironolactone (ALDACTONE) tablet 25 mg  25 mg Oral Daily Laurey Morale, MD   25 mg at 02/15/23 [phone]  Medications Prior to Admission  Medication Sig Dispense Refill Last Dose   aspirin EC 81 MG tablet Take 81 mg by mouth daily.   Past Week   metoprolol succinate (TOPROL-XL) 25 MG 24 hr tablet Take 25 mg by mouth daily.   Past Week   Multiple Vitamins-Minerals (MULTIVITAMIN MEN 50+) TABS Take 1 tablet by mouth daily.   Past Week   atorvastatin (LIPITOR) 80 MG tablet Take 1 tablet (80 mg total) by mouth daily. (Patient not taking: Reported on 02/14/2023)   Not Taking   furosemide (LASIX) 20 MG tablet Take 20 mg by mouth daily. (Patient not taking: Reported on 02/14/2023)   Not Taking   heparin 65784 UT/250ML infusion Inject 1,250 Units/hr into the vein continuous. (Patient not taking: Reported on 02/14/2023)   Not Taking   spironolactone (ALDACTONE) 25 MG tablet Take 0.5 tablets (12.5 mg total) by mouth daily. (Patient not taking: Reported on 02/14/2023)   Not Taking    Family History  Problem Relation Age of Onset   Diabetes Mother        gestational diabetes   Hypertension Father    Alcohol abuse Father    Heart disease Brother    Alcohol abuse Brother    Drug abuse Brother      Review of Systems:       Cardiac Review of Systems: Y or  [    ]= no  Chest Pain [    ]  Resting SOB [occasional] Exertional SOB  [frequent]  Orthopnea [  ]   Pedal Edema [present on admission, resolved]    Palpitations [  ] Syncope  [  ]   Presyncope [   ]  General Review of Systems: [Y] = yes [  ]=no Constitional: recent weight change [recent unintentional weight loss]; anorexia [  ]; fatigue [  ]; nausea [  ]; night sweats [  ]; fever [  ]; or  chills [  ]                                                               Dental: Last Dentist visit: About 6 months ago, scheduled for visit in January  Eye : blurred vision [  ]; diplopia [   ]; vision changes [  ];  Amaurosis fugax[  ]; Resp: cough [  ];  wheezing[  ];  hemoptysis[  ]; shortness of breath[ x ]; paroxysmal nocturnal dyspnea[  ]; dyspnea on exertion[x  ]; or orthopnea[  ];  GI:  gallstones[ x ], vomiting[  ];  dysphagia[  ]; melena[  ];  hematochezia [  ]; heartburn[  ];   Hx of  Colonoscopy[  ]; GU: kidney stones [  ]; hematuria[  ];   dysuria [  ];  nocturia[  ];  history of     obstruction [  ]; urinary frequency [  ]             Skin: rash, swelling[  ];, hair loss[  ];  peripheral edema[ x,  ];  or itching[  ]; Musculosketetal: myalgias[  ];  joint swelling[  ];  joint now resolved erythema[  ];  joint pain[  ];  back pain[  ];  Heme/Lymph: bruising[  ];  bleeding[  ];  anemia[  ];  Neuro: TIA[  ];  headaches[  ];  stroke[  ];  vertigo[  ];  seizures[  ];   paresthesias[  ];  difficulty walking[ x ];  Psych:depression[  ]; anxiety[  ];  Endocrine: diabetes[  ];  thyroid dysfunction[  ];                  Physical Exam: BP (!) 108/56 (BP Location: Right Arm)   Pulse 64   Temp (!) 97.5 F (36.4 C) (Axillary)   Resp 16   Ht 5\' 8"  (1.727 m) Comment: compared to tech's height  Wt 56.5 kg   SpO2 98%   BMI 18.93 kg/m    General appearance: alert, cooperative, no distress, and anxious Head: Normocephalic, without obvious abnormality, atraumatic Neck: no adenopathy, no carotid bruit, no JVD, supple, symmetrical, trachea midline, and there is a 1-1/2 cm cystic mass on the right side of the neck just below the angle of the jaw (he said this has been there for years and has not changed in character or size) Lymph nodes: No cervical or clavicular adenopathy Resp: Respirations are shallow but clear.  He has normal respiratory effort at rest.  Oxygen saturation is  satisfactory on room air Cardio: Regular rate and rhythm, monitor shows normal sinus rhythm.  I did not hear a murmur GI: Soft, nontender.  There is an obvious circular 3 cm umbilical hernia that is noninflamed and not tender Extremities: Thin, no deformities, all well-perfused with palpable distal pulses.  No significant varicosities in the lower extremities Neurologic: Anxious about being in the hospital and insistent that he needs to go home but he was cooperative and answered questions appropriately.  Diagnostic Studies & Laboratory data:    RIGHT/LEFT HEART CATH AND CORONARY ANGIOGRAPHY   Conclusion  Conclusions: Severe three-vessel coronary artery disease, as detailed below. Normal left and right heart filling pressures. Mild-moderately reduced Fick cardiac output/index.   Recommendations: Cardiac surgery consultation for CABG +/- MVR.  Patient wishes to discuss with his son before agreeing to transfer. Resume heparin infusion 2 hours after TR band removal. Aggressive secondary prevention of coronary artery disease. Maintain net even fluid balance.  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   Yvonne Kendall, MD [facility]  Coronary Findings  Diagnostic Dominance: Right Left Main  Vessel is moderate in size.  Mid LM to Dist LM lesion is 45% stenosed.    Left Anterior Descending  Vessel is moderate in size.  Prox LAD to Mid LAD lesion is 95% stenosed.  Mid LAD lesion is 70% stenosed.  Dist LAD lesion is 80% stenosed.    First Diagonal Branch  Vessel is moderate in size.  1st Diag lesion is 60% stenosed.    Second Diagonal Branch  Vessel is moderate in size.  2nd Diag lesion is 99% stenosed.    Ramus Intermedius  Ramus lesion is 50% stenosed.    Left Circumflex  Vessel is moderate in size.  Ost Cx to Prox Cx lesion is 60% stenosed.    First Obtuse Marginal Branch  Vessel is small in size.    Second  Obtuse Marginal Branch  Vessel is moderate in size.  2nd Mrg-1 lesion is 60% stenosed.  2nd Mrg-2 lesion is 80% stenosed.    Third Obtuse Marginal Branch  Vessel is small in size.    Right Coronary Artery  Vessel is large.  Prox RCA lesion is 50% stenosed.  Mid  RCA lesion is 99% stenosed. The lesion is focal.    Right Posterior Descending Artery  Vessel is moderate in size.  RPDA lesion is 70% stenosed.    Right Posterior Atrioventricular Artery  Vessel is moderate in size.  RPAV lesion is 30% stenosed.    First Right Posterolateral Branch  Vessel is small in size.    Second Right Posterolateral Branch  Vessel is small in size.    Third Right Posterolateral Branch  Vessel is moderate in size.  3rd RPL lesion is 80% stenosed.    Intervention   No interventions have been documented.   Right Heart  Right Heart Pressures RA (mean): 2 mmHg RV (S/EDP): 27/2 mmHg PA (S/D, mean): 25/10 (15) mmHg PCWP (mean): 12 mmHg  Ao sat: 95% PA sat: 59%  Fick CO: 3.5 L/min Fick CI: 2.0 L/min/m^2   Left Heart  Left Ventricle LV end diastolic pressure is normal. LVEDP 15 mmHg.  Aortic Valve There is no aortic valve stenosis.   Coronary Diagrams  Diagnostic Dominance: Right    ECHOCARDIOGRAM REPORT    Patient Name:   LADDIE WOODKE Date of Exam: 02/14/2023  Medical Rec #:  [id number]     Height:       71.0 in  Accession #:    [id number]    Weight:       124.6 lb  Date of Birth:  [birth year]     BSA:          1.724 m  Patient Age:    77 years      BP:           120/86 mmHg  Patient Gender: M             HR:           78 bpm.  Exam Location:  Inpatient   Procedure: 2D Echo, Cardiac Doppler and Color Doppler   STAT ECHO   Indications:    congestive heart failure    History:        Patient has prior history of Echocardiogram examinations,  most                 recent 01/16/2023. Signs/Symptoms:Dyspnea and Edema.    Sonographer:    Delcie Roch RDCS  Referring Phys:  [id number]   IMPRESSIONS     1. Left ventricular ejection fraction, by estimation, is <20%. The left  ventricle has severely decreased function. The left ventricle demonstrates  global hypokinesis. There is mild concentric left ventricular hypertrophy.  Left ventricular diastolic  parameters are indeterminate.   2. Right ventricular systolic function is moderately reduced. The right  ventricular size is normal. There is severely elevated pulmonary artery  systolic pressure.   3. Left atrial size was severely dilated.   4. Right atrial size was severely dilated.   5. The mitral valve is normal in structure. Moderate mitral valve  regurgitation. No evidence of mitral stenosis.   6. Tricuspid valve regurgitation is moderate.   7. Abdominal aorta is normal sized. The aortic valve has an indeterminant  number of cusps. Aortic valve regurgitation is not visualized. No aortic  stenosis is present.   8. The inferior vena cava is dilated in size with <50% respiratory  variability, suggesting right atrial pressure of 15 mmHg.   FINDINGS   Left Ventricle: Left ventricular ejection fraction, by estimation, is  <20%. The left ventricle has severely decreased function. The left  ventricle demonstrates global  hypokinesis. The left ventricular internal  cavity size was normal in size. There is  mild concentric left ventricular hypertrophy. Left ventricular diastolic  parameters are indeterminate.   Right Ventricle: The right ventricular size is normal. No increase in  right ventricular wall thickness. Right ventricular systolic function is  moderately reduced. There is severely elevated pulmonary artery systolic  pressure. The tricuspid regurgitant  velocity is 3.60 m/s, and with an assumed right atrial pressure of 15  mmHg, the estimated right ventricular systolic pressure is 66.8 mmHg.   Left Atrium: Left atrial size was severely dilated.   Right Atrium: Right atrial size  was severely dilated.   Pericardium: There is no evidence of pericardial effusion.   Mitral Valve: The mitral valve is normal in structure. Moderate mitral  valve regurgitation. No evidence of mitral valve stenosis.   Tricuspid Valve: The tricuspid valve is normal in structure. Tricuspid  valve regurgitation is moderate . No evidence of tricuspid stenosis.   Aortic Valve: Abdominal aorta is normal sized. The aortic valve has an  indeterminant number of cusps. Aortic valve regurgitation is not  visualized. No aortic stenosis is present.   Pulmonic Valve: The pulmonic valve was not well visualized. Pulmonic valve  regurgitation is trivial. No evidence of pulmonic stenosis.   Aorta: The aortic root is normal in size and structure.   Venous: The inferior vena cava is dilated in size with less than 50%  respiratory variability, suggesting right atrial pressure of 15 mmHg.   IAS/Shunts: No atrial level shunt detected by color flow Doppler.     LEFT VENTRICLE  PLAX 2D  LVIDd:         5.40 cm  LVIDs:         5.20 cm  LV PW:         1.10 cm  LV IVS:        1.10 cm  LVOT diam:     1.90 cm  LV SV:         24  LV SV Index:   14  LVOT Area:     2.84 cm     RIGHT VENTRICLE          IVC  RV Basal diam:  3.30 cm  IVC diam: 2.00 cm  TAPSE (M-mode): 1.6 cm   LEFT ATRIUM              Index        RIGHT ATRIUM           Index  LA diam:        4.50 cm  2.61 cm/m   RA Area:     20.40 cm  LA Vol (A2C):   101.0 ml 58.58 ml/m  RA Volume:   63.20 ml  36.66 ml/m  LA Vol (A4C):   86.8 ml  50.34 ml/m  LA Biplane Vol: 94.5 ml  54.81 ml/m   AORTIC VALVE  LVOT Vmax:   57.00 cm/s  LVOT Vmean:  35.200 cm/s  LVOT VTI:    0.085 m    AORTA  Ao Root diam: 3.20 cm  Ao Asc diam:  3.40 cm   TRICUSPID VALVE  TR Peak grad:   51.8 mmHg  TR Vmax:        360.00 cm/s    SHUNTS  Systemic VTI:  0.08 m  Systemic Diam: 1.90 cm   Kardie Tobb DO  Electronically signed by Thomasene Ripple DO  Signature  Date/Time: 02/14/2023/5:08:48 PM  Recent Radiology Findings:   CLINICAL DATA:  Chest pain   EXAM: CHEST - 2 VIEW   COMPARISON:  Chest radiograph dated 10/12/2022   FINDINGS: Normal lung volumes. Bibasilar patchy opacities. Small right and moderate left pleural effusions. No pneumothorax. Similar cardiomediastinal silhouette. No acute osseous abnormality.   IMPRESSION: 1. Small right and moderate left pleural effusions. 2. Bibasilar patchy opacities, likely atelectasis. Aspiration or pneumonia can be considered in the appropriate clinical setting.     Electronically Signed   By: Agustin Cree M.D.   On: 02/10/2023 12:12    CLINICAL DATA:  Shortness of breath, hypoxia.   EXAM: CT ANGIOGRAPHY CHEST WITH CONTRAST   TECHNIQUE: Multidetector CT imaging of the chest was performed using the standard protocol during bolus administration of intravenous contrast. Multiplanar CT image reconstructions and MIPs were obtained to evaluate the vascular anatomy.   RADIATION DOSE REDUCTION: This exam was performed according to the departmental dose-optimization program which includes automated exposure control, adjustment of the mA and/or kV according to patient size and/or use of iterative reconstruction technique.   CONTRAST:  OMNIPAQUE IOHEXOL 350 MG/ML SOLN   COMPARISON:  None Available.   FINDINGS: Cardiovascular: Satisfactory opacification of the pulmonary arteries to the segmental level. No evidence of pulmonary embolism. Normal heart size. No pericardial effusion.   Mediastinum/Nodes: No enlarged mediastinal, hilar, or axillary lymph nodes. Thyroid gland, trachea, and esophagus demonstrate no significant findings.   Lungs/Pleura: Moderate size right pleural effusion is noted with small left pleural effusion, but no pneumothorax is noted. Right middle lobe opacity is noted concerning for atelectasis or pneumonia. Mild left basilar opacity is noted most consistent  with atelectasis or possibly infiltrate.   Upper Abdomen: No acute abnormality.   Musculoskeletal: No chest wall abnormality. No acute or significant osseous findings.   Review of the MIP images confirms the above findings.   IMPRESSION: No definite evidence of pulmonary embolus.   Moderate size right pleural effusion is noted with small left pleural effusion.   Right middle lobe opacity is noted concerning for atelectasis or pneumonia. Mild left basilar opacity is noted most consistent with atelectasis or infiltrate.   Aortic Atherosclerosis (ICD10-I70.0).     Electronically Signed   By: Lupita Raider M.D.   On: 02/10/2023 13:14   CLINICAL DATA:  Epigastric abdominal pain.   EXAM: CT ABDOMEN AND PELVIS WITH CONTRAST   TECHNIQUE: Multidetector CT imaging of the abdomen and pelvis was performed using the standard protocol following bolus administration of intravenous contrast.   RADIATION DOSE REDUCTION: This exam was performed according to the departmental dose-optimization program which includes automated exposure control, adjustment of the mA and/or kV according to patient size and/or use of iterative reconstruction technique.   CONTRAST:  OMNIPAQUE IOHEXOL 350 MG/ML SOLN   COMPARISON:  None Available.   FINDINGS: Lower chest: Bilateral pleural effusions are noted, right greater than left.   Hepatobiliary: Moderate gallbladder wall thickening is noted without cholelithiasis. No biliary dilatation is noted. Probable 18 mm cyst seen in left hepatic lobe.   Pancreas: Unremarkable. No pancreatic ductal dilatation or surrounding inflammatory changes.   Spleen: Normal in size without focal abnormality.   Adrenals/Urinary Tract: Adrenal glands are unremarkable. Kidneys are normal, without renal calculi, focal lesion, or hydronephrosis. Bladder is unremarkable.   Stomach/Bowel: Stomach is within normal limits. Appendix appears normal. No evidence of  bowel wall thickening, distention, or inflammatory changes.   Vascular/Lymphatic: Aortic atherosclerosis. No enlarged abdominal or pelvic lymph nodes.  Reproductive: Prostate is unremarkable.   Other: Moderate size fat containing periumbilical hernia is noted. Minimal free fluid is noted in the pelvis.   Musculoskeletal: Severe degenerative changes seen involving the left hip. L2 level degenerative disc disease is noted in lumbar spine. No acute osseous abnormality is noted.   IMPRESSION: Moderate gallbladder wall thickening is noted without cholelithiasis. Ultrasound or HIDA scan is recommended to evaluate for acalculous cholecystitis.   Moderate bilateral pleural effusions.   Moderate size fat containing periumbilical hernia.   Minimal free fluid is noted in the pelvis of uncertain etiology.   Aortic Atherosclerosis (ICD10-I70.0).     Electronically Signed   By: Lupita Raider M.D.   On: 02/10/2023 13:34      I have independently reviewed the above radiologic studies and discussed with the patient   Recent Lab Findings: Lab Results  Component Value Date   WBC 5.9 02/15/2023   HGB 12.2 (L) 02/15/2023   HCT 37.6 (L) 02/15/2023   PLT 199 02/15/2023   GLUCOSE 91 02/15/2023   CHOL 90 02/11/2023   TRIG 45 02/11/2023   HDL 38 (L) 02/11/2023   LDLCALC 43 02/11/2023   ALT 106 (H) 02/11/2023   AST 193 (H) 02/11/2023   NA 134 (L) 02/15/2023   K 3.8 02/15/2023   CL 100 02/15/2023   CREATININE 0.85 02/15/2023   BUN 22 02/15/2023   CO2 24 02/15/2023   TSH 4.481 02/11/2023   INR 1.4 (H) 02/10/2023      Assessment / Plan:      -Severe three-vessel coronary artery disease with moderate mitral insufficiency and a 76 year old male presenting with acute non-ST elevation myocardial infarction in the setting of acute on chronic systolic congestive heart failure.  His ejection fraction is less than 20%.  He has been hospitalized since 02/10/2023 with significant  improvement in his resting respiratory status with diuresis.  He denies having any chest pain either prior to admission or since admission.  He is currently on heparin infusion, Lasix, digoxin, and spironolactone. The STS risk calculator places the operative mortality at 7.09% for isolated CABG with combined morbidity and mortality risk of 17.7% Dr. Dorris Fetch is aware of Mr. Pearcy's hospital admission and findings of the workup so far.  He will review further and comment regarding risks versus benefits of surgery.  -History of hypertension: Blood pressure is well-controlled on the current setting.  -Cholelithiasis without acute cholecystitis based on abdominal ultrasound and HIDA scan this admission.  -Acute kidney injury-present on admission and likely related to hypoperfusion.  Creatinine has normalized  -Umbilical hernia: Chronic, no evidence of incarceration.  I  spent 30 minutes counseling the patient face to face.   Leary Roca, PA-C  02/15/2023 10:08 AM  Patient seen and examined, agree with above.  I have reviewed Mr. Weltman's records and personally reviewed his cath and echo images.  77 yo man presented with confusion and shortness of breath.  Admitted with decompensated heart failure. W/u revealed EF 10% with severe MR,  cath showed severe 3 vessel CAD. CI 2.0 PA 25/10 PCWP 12. Repeat echo- EF 20%, moderate MR.  Has had issues with confusion while in hospital and has threatedned to leave earlier today.  Currently calm and cooperative, but anxious about not having son present.  CABG is indicated for survival benefit and relief of symptoms.  I think the STS risk calculator severely underestimates the risk in this case with his confusion making delirium a significant issue postoperatively.  May  not require MVR but final decision would be made in OR.  I discussed the proposed operative procedure with him.  We discussed the need for general anesthesia, the incisions to be  used, the use of cardiopulmonary bypass, and the need for drainage tubes and temporary pacemaker wires postoperatively.  We discussed the expected hospital stay, overall recovery and short and long term outcomes. I informed him of the indications, risks, benefits and alternatives.  He understands the risks include, but are not limited to death, stroke, MI, DVT/PE, bleeding, possible need for transfusion, infections, cardiac arrhythmias, possible need for mechanical assist device, as well as other organ system dysfunction including respiratory, renal, or GI complications.   He wants his son to make his decisions for him.  I am happy to speak with his son.  Salvatore Decent Dorris Fetch, MD [facility] [phone]

## 2023-02-15 NOTE — Progress Notes (Addendum)
Patient's son is from out of town and is concerned that he has not been updated by the Physician regarding the CABG procedure. His son is now at beside requesting a phone call after numerous attempts to reach the doctor throughout the day. The After hours number was contacted for a message to be sent to [facility] MD tomorrow.  Patient's son has to fly back to [place]. Due top this circumstance, Please call [name] [phone]. If she is not reachable, please leave a message.  Main concerns:  Family is on board with procedure. Is the procedure still CABG? When will the procedure be scheduled?

## 2023-02-16 DIAGNOSIS — I340 Nonrheumatic mitral (valve) insufficiency: Secondary | ICD-10-CM | POA: Diagnosis not present

## 2023-02-16 DIAGNOSIS — I2510 Atherosclerotic heart disease of native coronary artery without angina pectoris: Secondary | ICD-10-CM | POA: Diagnosis not present

## 2023-02-16 DIAGNOSIS — I214 Non-ST elevation (NSTEMI) myocardial infarction: Secondary | ICD-10-CM | POA: Diagnosis not present

## 2023-02-16 LAB — BASIC METABOLIC PANEL
Anion gap: 8 (ref 5–15)
BUN: 33 mg/dL — ABNORMAL HIGH (ref 8–23)
CO2: 25 mmol/L (ref 22–32)
Calcium: 8.6 mg/dL — ABNORMAL LOW (ref 8.9–10.3)
Chloride: 103 mmol/L (ref 98–111)
Creatinine, Ser: 0.84 mg/dL (ref 0.61–1.24)
GFR, Estimated: >60 mL/min (ref >60)
Glucose, Bld: 86 mg/dL (ref 70–99)
Potassium: 4.1 mmol/L (ref 3.5–5.1)
Sodium: 136 mmol/L (ref 135–145)

## 2023-02-16 LAB — CBC
HCT: 38 % — ABNORMAL LOW (ref 39.0–52.0)
Hemoglobin: 12.4 g/dL — ABNORMAL LOW (ref 13.0–17.0)
MCH: 26.8 pg (ref 26.0–34.0)
MCHC: 32.6 g/dL (ref 30.0–36.0)
MCV: 82.3 fL (ref 80.0–100.0)
Platelets: 211 10*3/uL (ref 150–400)
RBC: 4.62 MIL/uL (ref 4.22–5.81)
RDW: 19.5 % — ABNORMAL HIGH (ref 11.5–15.5)
WBC: 7.5 10*3/uL (ref 4.0–10.5)
nRBC: 0 % (ref 0.0–0.2)

## 2023-02-16 LAB — URINALYSIS, ROUTINE W REFLEX MICROSCOPIC
Bilirubin Urine: NEGATIVE
Glucose, UA: NEGATIVE mg/dL
Hgb urine dipstick: NEGATIVE
Ketones, ur: NEGATIVE mg/dL
Leukocytes,Ua: NEGATIVE
Nitrite: NEGATIVE
Protein, ur: NEGATIVE mg/dL
Specific Gravity, Urine: 1.014 (ref 1.005–1.030)
pH: 7 (ref 5.0–8.0)

## 2023-02-16 LAB — TYPE AND SCREEN
ABO/RH(D): O POS
Antibody Screen: NEGATIVE

## 2023-02-16 LAB — HEPARIN LEVEL (UNFRACTIONATED): Heparin Unfractionated: 0.5 [IU]/mL (ref 0.30–0.70)

## 2023-02-16 LAB — ABO/RH: ABO/RH(D): O POS

## 2023-02-16 MED ORDER — METOPROLOL SUCCINATE ER 25 MG PO TB24
12.5000 mg | ORAL_TABLET | Freq: Every day | ORAL | Status: DC
  Administered 2023-02-16 – 2023-02-28 (×13): 12.5 mg via ORAL
  Filled 2023-02-16 (×12): qty 1

## 2023-02-16 MED ORDER — METOPROLOL TARTRATE 12.5 MG HALF TABLET: 12.5000 mg | ORAL_TABLET | Freq: Once | ORAL | Status: DC

## 2023-02-16 MED ORDER — CHLORHEXIDINE GLUCONATE CLOTH 2 % EX PADS: 6.0000 | MEDICATED_PAD | Freq: Once | CUTANEOUS | Status: DC

## 2023-02-16 MED ORDER — BISACODYL 5 MG PO TBEC
5.0000 mg | DELAYED_RELEASE_TABLET | Freq: Once | ORAL | Status: DC
Start: 2023-02-18 — End: 2023-02-19

## 2023-02-16 MED ORDER — DIAZEPAM 2 MG PO TABS: 2.0000 mg | ORAL_TABLET | Freq: Once | ORAL | Status: DC

## 2023-02-16 MED ORDER — CHLORHEXIDINE GLUCONATE 0.12 % MT SOLN: 15.0000 mL | Freq: Once | OROMUCOSAL | Status: DC

## 2023-02-16 NOTE — TOC Progression Note (Signed)
Transition of Care [facility]) - Progression Note    Patient Details  Name: Walter Vargas MRN: [id number] Date of Birth: 1945-04-29  Transition of [facility]) CM/SW Contact  Nicanor Bake Phone Number: [phone] 02/16/2023, 2:14 PM  Clinical Narrative: HF CSW attempted to meet with pt at bedside. Pt was in the restroom and needed some assistance. CSW went to grab nurse for pt to assist with device while in the restroom. CSW will follow up with the pt at a more appropriate time.   TOC will continue following.            Expected Discharge Plan and Services                                               Social Determinants of Health (SDOH) Interventions SDOH Screenings   Food Insecurity: No Food Insecurity (02/14/2023)  Housing: Low Risk  (02/14/2023)  Transportation Needs: No Transportation Needs (02/14/2023)  Utilities: Not At Risk (02/14/2023)  Depression (PHQ2-9): Low Risk  (10/19/2022)  Tobacco Use: Medium Risk (02/14/2023)    Readmission Risk Interventions     No data to display

## 2023-02-16 NOTE — Plan of Care (Signed)
Problem: Education: Goal: Knowledge of General Education information will improve Description: Including pain rating scale, medication(s)/side effects and non-pharmacologic comfort measures Outcome: Progressing   Problem: Health Behavior/Discharge Planning: Goal: Ability to manage health-related needs will improve Outcome: Progressing   Problem: Clinical Measurements: Goal: Ability to maintain clinical measurements within normal limits will improve Outcome: Progressing Goal: Will remain free from infection Outcome: Progressing Goal: Diagnostic test results will improve Outcome: Progressing Goal: Respiratory complications will improve Outcome: Progressing Goal: Cardiovascular complication will be avoided Outcome: Progressing   Problem: Activity: Goal: Risk for activity intolerance will decrease Outcome: Progressing   Problem: Nutrition: Goal: Adequate nutrition will be maintained Outcome: Progressing   Problem: Coping: Goal: Level of anxiety will decrease Outcome: Progressing   Problem: Elimination: Goal: Will not experience complications related to bowel motility Outcome: Progressing Goal: Will not experience complications related to urinary retention Outcome: Progressing   Problem: Pain Management: Goal: General experience of comfort will improve Outcome: Progressing   Problem: Safety: Goal: Ability to remain free from injury will improve Outcome: Progressing   Problem: Skin Integrity: Goal: Risk for impaired skin integrity will decrease Outcome: Progressing   Problem: Education: Goal: Knowledge of disease or condition will improve Outcome: Progressing Goal: Understanding of medication regimen will improve Outcome: Progressing Goal: Individualized Educational Video(s) Outcome: Progressing   Problem: Activity: Goal: Ability to tolerate increased activity will improve Outcome: Progressing   Problem: Cardiac: Goal: Ability to achieve and maintain  adequate cardiopulmonary perfusion will improve Outcome: Progressing   Problem: Health Behavior/Discharge Planning: Goal: Ability to safely manage health-related needs after discharge will improve Outcome: Progressing   Problem: Education: Goal: Will demonstrate proper wound care and an understanding of methods to prevent future damage Outcome: Progressing Goal: Knowledge of disease or condition will improve Outcome: Progressing Goal: Knowledge of the prescribed therapeutic regimen will improve Outcome: Progressing Goal: Individualized Educational Video(s) Outcome: Progressing   Problem: Activity: Goal: Risk for activity intolerance will decrease Outcome: Progressing   Problem: Cardiac: Goal: Will achieve and/or maintain hemodynamic stability Outcome: Progressing   Problem: Clinical Measurements: Goal: Postoperative complications will be avoided or minimized Outcome: Progressing   Problem: Respiratory: Goal: Respiratory status will improve Outcome: Progressing   Problem: Skin Integrity: Goal: Wound healing without signs and symptoms of infection Outcome: Progressing Goal: Risk for impaired skin integrity will decrease Outcome: Progressing   Problem: Urinary Elimination: Goal: Ability to achieve and maintain adequate renal perfusion and functioning will improve Outcome: Progressing   Problem: Education: Goal: Knowledge of General Education information will improve Description: Including pain rating scale, medication(s)/side effects and non-pharmacologic comfort measures Outcome: Progressing   Problem: Health Behavior/Discharge Planning: Goal: Ability to manage health-related needs will improve Outcome: Progressing   Problem: Clinical Measurements: Goal: Ability to maintain clinical measurements within normal limits will improve Outcome: Progressing Goal: Will remain free from infection Outcome: Progressing Goal: Diagnostic test results will improve Outcome:  Progressing Goal: Respiratory complications will improve Outcome: Progressing Goal: Cardiovascular complication will be avoided Outcome: Progressing   Problem: Activity: Goal: Risk for activity intolerance will decrease Outcome: Progressing   Problem: Nutrition: Goal: Adequate nutrition will be maintained Outcome: Progressing   Problem: Coping: Goal: Level of anxiety will decrease Outcome: Progressing   Problem: Elimination: Goal: Will not experience complications related to bowel motility Outcome: Progressing Goal: Will not experience complications related to urinary retention Outcome: Progressing   Problem: Pain Management: Goal: General experience of comfort will improve Outcome: Progressing   Problem: Safety: Goal: Ability to remain free from injury  will improve Outcome: Progressing   Problem: Skin Integrity: Goal: Risk for impaired skin integrity will decrease Outcome: Progressing

## 2023-02-16 NOTE — Progress Notes (Signed)
Pts son, Walter Vargas, is able to talk now.  Please call him now for any procedure information.  Thank you.    Walter Vargas [phone]

## 2023-02-16 NOTE — Progress Notes (Addendum)
PHARMACY - ANTICOAGULATION CONSULT NOTE  Pharmacy Consult for Heparin Indication: NSTEMI, multi-vessel CAD  No Active Allergies   Patient Measurements: Height: 68"  Weight: 55.7 kg (initial weight 56.5 kg, repeated due to weight 63.7 kg at [facility]) - lasix 60 IV q12h x 4 doses given at [facility] 12/1>>12/3 IBW/kg: 68.4 kg Heparin Dosing Weight: 55.7 kg   Vital Signs: Temp: 97.4 F (36.3 C) (12/06 0409) Temp Source: Oral (12/06 0409) BP: 114/70 (12/06 0409) Pulse Rate: 54 (12/06 0409)  Labs: Recent Labs    02/14/23 0950 02/14/23 1739 02/14/23 1814 02/15/23 0304 02/16/23 0414  HGB  --   --  12.8* 12.2* 12.4*  HCT  --   --  39.5 37.6* 38.0*  PLT  --   --  212 199 211  HEPARINUNFRC 0.57  --   --  0.54 0.50  CREATININE  --  0.90  --  0.85 0.84    Estimated Creatinine Clearance: 58.4 mL/min (by C-G formula based on SCr of 0.84 mg/dL).   Medical History: Past Medical History:  Diagnosis Date   Essential hypertension    HFrEF (heart failure with reduced ejection fraction) (HCC)    Mitral regurgitation     Assessment: Patient is a 77 y.o. male who presented to the ED on 02/10/2023 with SOB and chest pain. PMH includes HFrEF with EF < 20 RV mod reduced and mildly enlarged, mod pulm artery pressure, LA mod dilated, RA mildly dilated, mod pleural effusion, severe MVR. On admission BNP > 4,500 and troponins 13,606. 12/3 taken for Right/Left heart cath and coronary angiography. Showed severe three-vessel disease. Post procedure recommends CABG +/- MVR. Pharmacy consulted to dose and manage heparin.   Heparin level remains therapeutic a t0.5, on heparin infusion at 1250 units/hr. Hgb 12.4, plt 211. No s/sx of bleeding or infusion issues - IV had to be changed out after shift change.  Goal of Therapy:  Heparin level 0.3-0.7 units/ml Monitor platelets by anticoagulation protocol: Yes   Plan:  Continue heparin drip at 1250 units/hr Daily heparin level and CBC. Follow up timing of  CABG   Thank you for allowing pharmacy to participate in this patient's care,  Sherron Monday, PharmD, BCCCP Clinical Pharmacist  Phone: [phone] 02/16/2023 8:32 AM  Please check AMION for all [facility] Pharmacy phone numbers After 10:00 PM, call Main Pharmacy [phone]

## 2023-02-16 NOTE — Progress Notes (Addendum)
Advanced Heart Failure Rounding Note  PCP-Cardiologist: None   Subjective:   Transferred to [facility] for CT surgery consultation for possible CABG   TCTS saw patient yesterday. Timing of CABG pending but suspect early next week.   Feels good this morning, sitting on EOB eating breakfast.   Objective:   Weight Range: 56.1 kg Body mass index is 18.81 kg/m.   Vital Signs:   Temp:  [97.4 F (36.3 C)-98 F (36.7 C)] 97.4 F (36.3 C) (12/06 0409) Pulse Rate:  [54-95] 54 (12/06 0409) Resp:  [16-18] 18 (12/06 0409) BP: (91-114)/(53-84) 114/70 (12/06 0409) SpO2:  [96 %] 96 % (12/06 0409) Weight:  [56.1 kg] 56.1 kg (12/06 0409) Last BM Date : 02/14/23  Weight change: Filed Weights   02/14/23 1714 02/15/23 0435 02/16/23 0409  Weight: 55.7 kg 56.5 kg 56.1 kg    Intake/Output:   Intake/Output Summary (Last 24 hours) at 02/16/2023 1008 Last data filed at 02/16/2023 0900 Gross per 24 hour  Intake 273.68 ml  Output 700 ml  Net -426.32 ml    Physical Exam  General:  elderly appearing.  No respiratory difficulty HEENT: normal Neck: supple. JVD ~7 cm. Carotids 2+ bilat; no bruits. No lymphadenopathy or thyromegaly appreciated. Cor: PMI nondisplaced. Regular rate & rhythm. No rubs, gallops or murmurs. Lungs: clear Abdomen: soft, nontender, nondistended. No hepatosplenomegaly. No bruits or masses. Good bowel sounds. Extremities: no cyanosis, clubbing, rash, edema  Neuro: alert & oriented x 3, cranial nerves grossly intact. moves all 4 extremities w/o difficulty. Affect pleasant.   Telemetry   NSR 80s (Personally reviewed)    EKG    No new EKG to review  Labs    CBC Recent Labs    02/15/23 0304 02/16/23 0414  WBC 5.9 7.5  HGB 12.2* 12.4*  HCT 37.6* 38.0*  MCV 81.9 82.3  PLT 199 211   Basic Metabolic Panel Recent Labs    16/10/96 0124 02/14/23 1739 02/15/23 0304 02/16/23 0414  NA 134*  --  134* 136  K 3.9  --  3.8 4.1  CL 99  --  100 103  CO2 26  --   24 25  GLUCOSE 95  --  91 86  BUN 24*  --  22 33*  CREATININE 0.87   < > 0.85 0.84  CALCIUM 8.1*  --  8.5* 8.6*  MG 2.3  --   --   --    < > = values in this interval not displayed.   Liver Function Tests No results for input(s): "AST", "ALT", "ALKPHOS", "BILITOT", "PROT", "ALBUMIN" in the last 72 hours. No results for input(s): "LIPASE", "AMYLASE" in the last 72 hours. Cardiac Enzymes No results for input(s): "CKTOTAL", "CKMB", "CKMBINDEX", "TROPONINI" in the last 72 hours.  BNP: BNP (last 3 results) Recent Labs    02/10/23 1120  BNP >4,500.0*    ProBNP (last 3 results) Recent Labs    10/12/22 1030 10/19/22 0852 01/08/23 0957  PROBNP 2,878.0* 2,297.0* >4978.0*   D-Dimer No results for input(s): "DDIMER" in the last 72 hours. Hemoglobin A1C No results for input(s): "HGBA1C" in the last 72 hours. Fasting Lipid Panel No results for input(s): "CHOL", "HDL", "LDLCALC", "TRIG", "CHOLHDL", "LDLDIRECT" in the last 72 hours. Thyroid Function Tests No results for input(s): "TSH", "T4TOTAL", "T3FREE", "THYROIDAB" in the last 72 hours.  Invalid input(s): "FREET3"  Other results:  Imaging  No results found.   Medications:   Scheduled Medications:  aspirin  81 mg Oral  Daily   atorvastatin  80 mg Oral QPM   digoxin  0.125 mg Oral Daily   furosemide  20 mg Oral Daily   sodium chloride flush  3 mL Intravenous Q12H   spironolactone  25 mg Oral Daily    Infusions:  heparin 1,250 Units/hr (02/15/23 2306)    PRN Medications: acetaminophen, ondansetron (ZOFRAN) IV, sodium chloride flush  Patient Profile  [name] is a 77 y.o. history of HTN, MR, HFrEF, and severe 3V CAD.    Transferred to [facility] for CT surgery consultation for possible CABG.  Assessment/Plan  1. CAD: Patient admitted with NSTEMI (?culprit D2) but with severe extensive diffuse 3 vessel disease.  Disease too extensive to treat percutaneously.  Anatomy would be best served by CABG, but CABG would be  moderate-high risk given cardiomyopathy.  Echo 12/4 showed mild-moderate RV dysfunction and PAPi was adequate on RHC, so do not think RV function will be a limitation.  Patient seems to be reasonably active at baseline without significant comorbidities. - TCTS consult for high risk CABG, timing TBD but suspect early next week.  - Continue heparin gtt for now.  - Continue ASA 81 - Continue statin.  - We could obtain a viability study, but LV walls are not severely thinned and presence/absence of viability has not been shown to have a significant impact on outcome for CABG in heart failure.  2. Acute on chronic systolic CHF: Patient has had HF symptoms for at least a couple of months.  Suspect pre-existing ischemic cardiomyopathy then he presented this admission with NSTEMI possible in D2 territory leading to CHF exacerbation. He has been diuresed, RHC 12/3 showed normal filling pressures and low CI at 2.0. Echo 12/4: EF 20%, RV mild-moderately dysfunctional, mild-moderate MR.   - Volume stable. Continue lasix 20 mg daily  - Continue digoxin 0.125 daily.  - Continue spironolactone 25 daily.  - Hold on adding SGLT2i with possible procedure. - Suspect patient's best chance to improve LV function will be CABG.  If deemed too high risk, he may be LVAD candidate.  3. Delirium: Suspect some underlying mild demential.  Patient has had on and off confusion in hospital.  He would be at risk for worsening post-op delirium in setting of CABG.  4. Gallbladder disease: Patient had gallstones but HIDA scan not suggestive of cholecystitis.  Length of Stay: 2  Walter Bleacher, NP  02/16/2023, 10:08 AM  Advanced Heart Failure Team Pager [phone] (M-F; 7a - 5p)  Please contact [facility] for night-coverage after hours (5p -7a ) and weekends on [email or web address]  Patient seen with NP, agree with the above note.   No chest pain or dyspnea today, eating lunch. Converses normally with me today.   General: NAD Neck: No  JVD, no thyromegaly or thyroid nodule.  Lungs: Clear to auscultation bilaterally with normal respiratory effort. CV: Nondisplaced PMI.  Heart regular S1/S2, no S3/S4, no murmur.  No peripheral edema.   Abdomen: Soft, nontender, no hepatosplenomegaly, no distention.  Skin: Intact without lesions or rashes.  Neurologic: Alert and oriented x 3.  Psych: Normal affect. Extremities: No clubbing or cyanosis.  HEENT: Normal.   Stable awaiting CABG.  As noted above, will be high risk due to low EF as well as mild dementia with delirium risk.  He is not volume overloaded on exam.   - Will continue current meds and add low dose Toprol XL 12.5 mg daily.    TCTS following, CABG plan to be decided.  I spoke with son by phone today.   Walter Vargas 02/16/2023 12:41 PM

## 2023-02-16 NOTE — Progress Notes (Signed)
Procedure(s) (LRB): CORONARY ARTERY BYPASS GRAFTING (CABG) (N/A) possible MITRAL VALVE REPAIR or replacement (MVR) (N/A) TRANSESOPHAGEAL ECHOCARDIOGRAM (TEE) (N/A) Subjective: No CP or SOB  Objective: Vital signs in last 24 hours: Temp:  [97.4 F (36.3 C)-98 F (36.7 C)] 97.4 F (36.3 C) (12/06 1403) Pulse Rate:  [54-95] 82 (12/06 1403) Cardiac Rhythm: Normal sinus rhythm (12/06 0946) Resp:  [16-18] 18 (12/06 1403) BP: (91-114)/(67-84) 111/77 (12/06 1403) SpO2:  [96 %-100 %] 100 % (12/06 1403) Weight:  [56.1 kg] 56.1 kg (12/06 0409)  Hemodynamic parameters for last 24 hours:    Intake/Output from previous day: 12/05 0701 - 12/06 0700 In: 333.7 [P.O.:120; I.V.:213.7] Out: 700 [Urine:700] Intake/Output this shift: Total I/O In: 60 [P.O.:60] Out: 250 [Urine:250]  General appearance: alert, cooperative, and no distress Neurologic: intact Heart: regular rate and rhythm Lungs: clear to auscultation bilaterally  Lab Results: Recent Labs    02/15/23 0304 02/16/23 0414  WBC 5.9 7.5  HGB 12.2* 12.4*  HCT 37.6* 38.0*  PLT 199 211   BMET:  Recent Labs    02/15/23 0304 02/16/23 0414  NA 134* 136  K 3.8 4.1  CL 100 103  CO2 24 25  GLUCOSE 91 86  BUN 22 33*  CREATININE 0.85 0.84  CALCIUM 8.5* 8.6*    PT/INR: No results for input(s): "LABPROT", "INR" in the last 72 hours. ABG    Component Value Date/Time   PHART 7.443 02/13/2023 1327   HCO3 24.7 02/13/2023 1327   TCO2 26 02/13/2023 1327   O2SAT 95 02/13/2023 1327   CBG (last 3)  No results for input(s): "GLUCAP" in the last 72 hours.  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 Pecina.  I  also spoke with his son on the telephone.  We discussed the general nature of the procedure, including the need for general anesthesia, the incisions to be used, the use of cardiopulmonary bypass, and the use of drainage tubes and temporary pacemaker wires postoperatively.  We discussed the expected hospital stay, overall recovery and short and long term outcomes.  I informed him of the indications, risks, benefits and alternatives.  He understands the risks include, but are not limited to death, stroke, MI, DVT/PE, bleeding, possible need for transfusion, infections, possible need for mechanical assistance device, as well as other organ system dysfunction including respiratory, renal, or GI complications.   He accept the risks and wishes to proceed.   Plan CABG, possible mitral repair or replacement on Monday 02/19/2023   LOS: 2 days    [name] 02/16/2023

## 2023-02-17 ENCOUNTER — Other Ambulatory Visit (HOSPITAL_COMMUNITY): Payer: 59

## 2023-02-17 ENCOUNTER — Inpatient Hospital Stay (HOSPITAL_COMMUNITY): Payer: 59

## 2023-02-17 DIAGNOSIS — I214 Non-ST elevation (NSTEMI) myocardial infarction: Secondary | ICD-10-CM | POA: Diagnosis not present

## 2023-02-17 DIAGNOSIS — I5021 Acute systolic (congestive) heart failure: Secondary | ICD-10-CM | POA: Diagnosis not present

## 2023-02-17 LAB — CBC
HCT: 37.4 % — ABNORMAL LOW (ref 39.0–52.0)
Hemoglobin: 11.9 g/dL — ABNORMAL LOW (ref 13.0–17.0)
MCH: 26.3 pg (ref 26.0–34.0)
MCHC: 31.8 g/dL (ref 30.0–36.0)
MCV: 82.7 fL (ref 80.0–100.0)
Platelets: 220 10*3/uL (ref 150–400)
RBC: 4.52 MIL/uL (ref 4.22–5.81)
RDW: 19.6 % — ABNORMAL HIGH (ref 11.5–15.5)
WBC: 7.7 10*3/uL (ref 4.0–10.5)
nRBC: 0 % (ref 0.0–0.2)

## 2023-02-17 LAB — BASIC METABOLIC PANEL
Anion gap: 10 (ref 5–15)
BUN: 17 mg/dL (ref 8–23)
CO2: 23 mmol/L (ref 22–32)
Calcium: 8.7 mg/dL — ABNORMAL LOW (ref 8.9–10.3)
Chloride: 103 mmol/L (ref 98–111)
Creatinine, Ser: 0.72 mg/dL (ref 0.61–1.24)
GFR, Estimated: >60 mL/min (ref >60)
Glucose, Bld: 91 mg/dL (ref 70–99)
Potassium: 3.9 mmol/L (ref 3.5–5.1)
Sodium: 136 mmol/L (ref 135–145)

## 2023-02-17 LAB — HEPARIN LEVEL (UNFRACTIONATED): Heparin Unfractionated: 0.48 [IU]/mL (ref 0.30–0.70)

## 2023-02-17 LAB — SURGICAL PCR SCREEN
MRSA, PCR: NEGATIVE
Staphylococcus aureus: NEGATIVE

## 2023-02-17 LAB — GLUCOSE, CAPILLARY: Glucose-Capillary: 130 mg/dL — ABNORMAL HIGH (ref 70–99)

## 2023-02-17 MED ORDER — TRANEXAMIC ACID (OHS) PUMP PRIME SOLUTION
2.0000 mg/kg | INTRAVENOUS | Status: DC
  Filled 2023-02-17: qty 1.12

## 2023-02-17 MED ORDER — POTASSIUM CHLORIDE 2 MEQ/ML IV SOLN
80.0000 meq | INTRAVENOUS | Status: DC
  Filled 2023-02-17: qty 40

## 2023-02-17 MED ORDER — NITROGLYCERIN IN D5W 200-5 MCG/ML-% IV SOLN
2.0000 ug/min | INTRAVENOUS | Status: DC
  Filled 2023-02-17: qty 250

## 2023-02-17 MED ORDER — CEFAZOLIN SODIUM-DEXTROSE 2-4 GM/100ML-% IV SOLN
2.0000 g | INTRAVENOUS | Status: DC
  Filled 2023-02-17: qty 100

## 2023-02-17 MED ORDER — PLASMA-LYTE A IV SOLN
INTRAVENOUS | Status: DC
  Filled 2023-02-17: qty 2.5

## 2023-02-17 MED ORDER — NOREPINEPHRINE 4 MG/250ML-% IV SOLN
0.0000 ug/min | INTRAVENOUS | Status: DC
  Filled 2023-02-17: qty 250

## 2023-02-17 MED ORDER — TRANEXAMIC ACID 1000 MG/10ML IV SOLN
1.5000 mg/kg/h | INTRAVENOUS | Status: DC
  Filled 2023-02-17: qty 25

## 2023-02-17 MED ORDER — MANNITOL 20 % IV SOLN
INTRAVENOUS | Status: DC
  Filled 2023-02-17: qty 13

## 2023-02-17 MED ORDER — MILRINONE LACTATE IN DEXTROSE 20-5 MG/100ML-% IV SOLN
0.3000 ug/kg/min | INTRAVENOUS | Status: DC
  Filled 2023-02-17: qty 100

## 2023-02-17 MED ORDER — PHENYLEPHRINE HCL-NACL 20-0.9 MG/250ML-% IV SOLN
30.0000 ug/min | INTRAVENOUS | Status: DC
Start: 2023-02-19 — End: 2023-02-19
  Filled 2023-02-17: qty 250

## 2023-02-17 MED ORDER — HEPARIN 30,000 UNITS/1000 ML (OHS) CELLSAVER SOLUTION
Status: DC
  Filled 2023-02-17: qty 1000

## 2023-02-17 MED ORDER — VANCOMYCIN HCL 1250 MG/250ML IV SOLN
1250.0000 mg | INTRAVENOUS | Status: DC
  Filled 2023-02-17: qty 250

## 2023-02-17 MED ORDER — DEXMEDETOMIDINE HCL IN NACL 400 MCG/100ML IV SOLN
0.1000 ug/kg/h | INTRAVENOUS | Status: DC
Start: 2023-02-19 — End: 2023-02-19
  Filled 2023-02-17: qty 100

## 2023-02-17 MED ORDER — TRANEXAMIC ACID (OHS) BOLUS VIA INFUSION
15.0000 mg/kg | INTRAVENOUS | Status: DC
  Filled 2023-02-17: qty 843

## 2023-02-17 MED ORDER — EPINEPHRINE HCL 5 MG/250ML IV SOLN IN NS
0.0000 ug/min | INTRAVENOUS | Status: DC
  Filled 2023-02-17: qty 250

## 2023-02-17 MED ORDER — INSULIN REGULAR(HUMAN) IN NACL 100-0.9 UT/100ML-% IV SOLN
INTRAVENOUS | Status: DC
  Filled 2023-02-17: qty 100

## 2023-02-17 NOTE — Evaluation (Signed)
Physical Therapy Evaluation Patient Details Name: Walter Vargas MRN: [id number] DOB: 1945/06/29 Today's Date: 02/17/2023  History of Present Illness  77 y.o. male admitted 02/13/22 with NSTEMI, CHF exacerbation. LHC with 3-vessel CAD. Plan for CABG with possible MVR on 12/9. PMH includes HTN, HF, mod/severe MR.   Clinical Impression  Pt presents with an overall decrease in functional mobility secondary to above. PTA, pt independent, lives alone in apartment, drives. Initiated pre-op CABG education re: sternal precautions, activity recommendations and importance of mobility. Today, pt able to transfer and ambulate without DME at supervision-level. Pt with supportive sister/brother-in-law who plan to provide 24/7 assist for patient at home; educ on HHPT vs SNF pending activity progression post-op. Will plan to follow-up for PT Re-Evaluation post-op CABG as appropriate.    If plan is discharge home, recommend the following: Assistance with cooking/housework;Direct supervision/assist for medications management;Direct supervision/assist for financial management;Assist for transportation   Can travel by private vehicle        Equipment Recommendations Other (comment) (TBD post-op CABG)  Recommendations for Other Services       Functional Status Assessment       Precautions / Restrictions Precautions Precautions: Fall;Other (comment) Precaution Comments: educ on sternal precautions in preparation for CABG on 12/9 Restrictions Weight Bearing Restrictions: No      Mobility  Bed Mobility               General bed mobility comments: received sitting EOB    Transfers Overall transfer level: Modified independent Equipment used: None Transfers: Sit to/from Stand             General transfer comment: unable to stand without UEs or with hands on knees, reliant on momentum and pushing with BUEs next to knees to stand; 4x sit<>stand from EOB throughout session     Ambulation/Gait Ambulation/Gait assistance: Supervision Gait Distance (Feet): 360 Feet Assistive device: None Gait Pattern/deviations: Step-through pattern, Decreased stride length Gait velocity: Decreased     General Gait Details: slow, mostly steady gait without DME, 1x self-corrected LOB with direction change; supervision for safety/line management  Stairs            Wheelchair Mobility     Tilt Bed    Modified Rankin (Stroke Patients Only)       Balance Overall balance assessment: Needs assistance Sitting-balance support: No upper extremity supported, Feet supported Sitting balance-Leahy Scale: Good     Standing balance support: No upper extremity supported, During functional activity Standing balance-Leahy Scale: Good                               Pertinent Vitals/Pain Pain Assessment Pain Assessment: No/denies pain    Home Living Family/patient expects to be discharged to:: Private residence Living Arrangements: Alone Available Help at Discharge: Family;Available 24 hours/day Type of Home: Apartment Home Access: Stairs to enter Entrance Stairs-Rails: Right Entrance Stairs-Number of Steps: 3   Home Layout: One level Home Equipment: None Additional Comments: lives alone; sister and brother-in-law (in good shape) live in [place], state they're retired and plan to stay with pt for initial 24/7 assist at d/c    Prior Function Prior Level of Function : Independent/Modified Independent;Driving             Mobility Comments: independent without DME. drives. enjoys going to [employer] to socialize in the morning       Extremity/Trunk Assessment   Upper Extremity Assessment Upper Extremity Assessment:  Overall WFL for tasks assessed    Lower Extremity Assessment Lower Extremity Assessment: Overall WFL for tasks assessed    Cervical / Trunk Assessment Cervical / Trunk Assessment: Kyphotic  Communication    Communication Communication: No apparent difficulties  Cognition Arousal: Alert Behavior During Therapy: Restless, Anxious Overall Cognitive Status: History of cognitive impairments - at baseline                                 General Comments: pt easily distracted and has difficulty maintaining attention on current task, very focused on wanting floor cleaned and needing to figure something out on phone. pt requires encouragement to mobilize, initially declining to walk. pt's brother-in-law states this is baseline cognition        General Comments General comments (skin integrity, edema, etc.): pt's sister and BIL present, supportive. initiated educ re: POC, sternal precautions, activity recommendations, importance of OOB mobility (pre-op and post-op), potential discharge needs    Exercises     Assessment/Plan    PT Assessment Patient needs continued PT services  PT Problem List Decreased strength;Decreased activity tolerance;Decreased balance;Decreased mobility;Decreased cognition;Decreased knowledge of use of DME;Decreased knowledge of precautions;Cardiopulmonary status limiting activity       PT Treatment Interventions DME instruction;Gait training;Stair training;Functional mobility training;Therapeutic activities;Therapeutic exercise;Balance training;Patient/family education;Cognitive remediation    PT Goals (Current goals can be found in the Care Plan section)  Acute Rehab PT Goals Patient Stated Goal: return home PT Goal Formulation: With patient/family Time For Goal Achievement: 03/03/23 Potential to Achieve Goals: Good    Frequency Min 1X/week     Co-evaluation               AM-PAC PT "6 Clicks" Mobility  Outcome Measure Help needed turning from your back to your side while in a flat bed without using bedrails?: None Help needed moving from lying on your back to sitting on the side of a flat bed without using bedrails?: None Help needed moving  to and from a bed to a chair (including a wheelchair)?: None Help needed standing up from a chair using your arms (e.g., wheelchair or bedside chair)?: None Help needed to walk in hospital room?: A Little Help needed climbing 3-5 steps with a railing? : A Little 6 Click Score: 22    End of Session   Activity Tolerance: Patient tolerated treatment well Patient left: in bed;with call bell/phone within reach;with family/visitor present Nurse Communication: Mobility status PT Visit Diagnosis: Other abnormalities of gait and mobility (R26.89)    Time: 1030-1050 PT Time Calculation (min) (ACUTE ONLY): 20 min   Charges:   PT Evaluation $PT Eval Low Complexity: 1 Low   PT General Charges $$ ACUTE PT VISIT: 1 Visit       Ina Homes, PT, DPT Acute Rehabilitation Services  Personal: Secure Chat Rehab Office: [phone]  Malachy Chamber 02/17/2023, 12:51 PM

## 2023-02-17 NOTE — Progress Notes (Signed)
PHARMACY - ANTICOAGULATION CONSULT NOTE  Pharmacy Consult for Heparin Indication: NSTEMI, multi-vessel CAD  No Active Allergies   Patient Measurements: Height: 68"  Weight: 55.7 kg (initial weight 56.5 kg, repeated due to weight 63.7 kg at [facility]) - lasix 60 IV q12h x 4 doses given at [facility] 12/1>>12/3 IBW/kg: 68.4 kg Heparin Dosing Weight: 55.7 kg   Vital Signs: Temp: 97.8 F (36.6 C) (12/07 0401) Temp Source: Oral (12/07 0401) BP: 108/62 (12/07 0401) Pulse Rate: 60 (12/07 0401)  Labs: Recent Labs    02/15/23 0304 02/16/23 0414 02/17/23 0259  HGB 12.2* 12.4* 11.9*  HCT 37.6* 38.0* 37.4*  PLT 199 211 220  HEPARINUNFRC 0.54 0.50 0.48  CREATININE 0.85 0.84 0.72    Estimated Creatinine Clearance: 61.5 mL/min (by C-G formula based on SCr of 0.72 mg/dL).   Medical History: Past Medical History:  Diagnosis Date   Essential hypertension    HFrEF (heart failure with reduced ejection fraction) (HCC)    Mitral regurgitation     Assessment: Patient is a 77 y.o. male who presented to the ED on 02/10/2023 with SOB and chest pain. PMH includes HFrEF with EF < 20 RV mod reduced and mildly enlarged, mod pulm artery pressure, LA mod dilated, RA mildly dilated, mod pleural effusion, severe MVR. On admission BNP > 4,500 and troponins 13,606. 12/3 taken for Right/Left heart cath and coronary angiography. Showed severe three-vessel disease. Post procedure recommends CABG +/- MVR. Pharmacy consulted to dose and manage IV heparin.   Heparin level remains therapeutic at 0.48, on heparin infusion at 1250 units/hr. Hgb 11.9, plt 220. No s/sx of bleeding or infusion issues.    Goal of Therapy:  Heparin level 0.3-0.7 units/ml Monitor platelets by anticoagulation protocol: Yes   Plan:  Continue heparin infusion at 1250 units/hr Daily heparin level and CBC. Scheduled for CABG 12/9  Thank you for allowing pharmacy to participate in this patient's care,  [employer], Pharm.D.,  BCPS Clinical Pharmacist  **Pharmacist phone directory can be found on [email or web address] listed under [facility].  02/17/2023 10:29 AM

## 2023-02-17 NOTE — Progress Notes (Signed)
Cardiac rehab came by to walk pt. Pt declined walk at this time. He stated he does not want to walk until after his surgery. Pre-op education done on sternal precautions, IS use, importance of mobility and OHS booklet was given. We will continue to follow.   [id number] Guss Bunde, RRT 02/17/2023 9:25 AM

## 2023-02-17 NOTE — Progress Notes (Signed)
VASCULAR LAB    Attempted pre CABG Dopplers, however, only able to get the right carotid scanned as patient refused to go on with the study after his meal was delivered.  The vascular lab will re-attempt 12/8 as schedule allows.     Krupa Stege, RVT 02/17/2023, 5:47 PM

## 2023-02-17 NOTE — Plan of Care (Signed)
Problem: Education: Goal: Knowledge of General Education information will improve Description: Including pain rating scale, medication(s)/side effects and non-pharmacologic comfort measures Outcome: Progressing   Problem: Health Behavior/Discharge Planning: Goal: Ability to manage health-related needs will improve Outcome: Progressing   Problem: Clinical Measurements: Goal: Ability to maintain clinical measurements within normal limits will improve Outcome: Progressing Goal: Will remain free from infection Outcome: Progressing Goal: Diagnostic test results will improve Outcome: Progressing Goal: Respiratory complications will improve Outcome: Progressing Goal: Cardiovascular complication will be avoided Outcome: Progressing   Problem: Activity: Goal: Risk for activity intolerance will decrease Outcome: Progressing   Problem: Nutrition: Goal: Adequate nutrition will be maintained Outcome: Progressing   Problem: Coping: Goal: Level of anxiety will decrease Outcome: Progressing   Problem: Elimination: Goal: Will not experience complications related to bowel motility Outcome: Progressing Goal: Will not experience complications related to urinary retention Outcome: Progressing   Problem: Pain Management: Goal: General experience of comfort will improve Outcome: Progressing   Problem: Safety: Goal: Ability to remain free from injury will improve Outcome: Progressing   Problem: Skin Integrity: Goal: Risk for impaired skin integrity will decrease Outcome: Progressing   Problem: Education: Goal: Knowledge of disease or condition will improve Outcome: Progressing Goal: Understanding of medication regimen will improve Outcome: Progressing Goal: Individualized Educational Video(s) Outcome: Progressing   Problem: Activity: Goal: Ability to tolerate increased activity will improve Outcome: Progressing   Problem: Cardiac: Goal: Ability to achieve and maintain  adequate cardiopulmonary perfusion will improve Outcome: Progressing   Problem: Health Behavior/Discharge Planning: Goal: Ability to safely manage health-related needs after discharge will improve Outcome: Progressing   Problem: Education: Goal: Will demonstrate proper wound care and an understanding of methods to prevent future damage Outcome: Progressing Goal: Knowledge of disease or condition will improve Outcome: Progressing Goal: Knowledge of the prescribed therapeutic regimen will improve Outcome: Progressing Goal: Individualized Educational Video(s) Outcome: Progressing   Problem: Activity: Goal: Risk for activity intolerance will decrease Outcome: Progressing   Problem: Cardiac: Goal: Will achieve and/or maintain hemodynamic stability Outcome: Progressing   Problem: Clinical Measurements: Goal: Postoperative complications will be avoided or minimized Outcome: Progressing   Problem: Respiratory: Goal: Respiratory status will improve Outcome: Progressing   Problem: Skin Integrity: Goal: Wound healing without signs and symptoms of infection Outcome: Progressing Goal: Risk for impaired skin integrity will decrease Outcome: Progressing   Problem: Urinary Elimination: Goal: Ability to achieve and maintain adequate renal perfusion and functioning will improve Outcome: Progressing   Problem: Education: Goal: Knowledge of General Education information will improve Description: Including pain rating scale, medication(s)/side effects and non-pharmacologic comfort measures Outcome: Progressing   Problem: Health Behavior/Discharge Planning: Goal: Ability to manage health-related needs will improve Outcome: Progressing   Problem: Clinical Measurements: Goal: Ability to maintain clinical measurements within normal limits will improve Outcome: Progressing Goal: Will remain free from infection Outcome: Progressing Goal: Diagnostic test results will improve Outcome:  Progressing Goal: Respiratory complications will improve Outcome: Progressing Goal: Cardiovascular complication will be avoided Outcome: Progressing   Problem: Activity: Goal: Risk for activity intolerance will decrease Outcome: Progressing   Problem: Nutrition: Goal: Adequate nutrition will be maintained Outcome: Progressing   Problem: Coping: Goal: Level of anxiety will decrease Outcome: Progressing   Problem: Elimination: Goal: Will not experience complications related to bowel motility Outcome: Progressing Goal: Will not experience complications related to urinary retention Outcome: Progressing   Problem: Pain Management: Goal: General experience of comfort will improve Outcome: Progressing   Problem: Safety: Goal: Ability to remain free from injury  will improve Outcome: Progressing   Problem: Skin Integrity: Goal: Risk for impaired skin integrity will decrease Outcome: Progressing

## 2023-02-17 NOTE — Progress Notes (Signed)
DAILY PROGRESS NOTE   Patient Name: Walter Vargas Date of Encounter: 02/17/2023 Cardiologist: None  Chief Complaint   No chest pain or dyspnea  Patient Profile   Walter Vargas is a 77 year old history of HTN, Walter, HFrEF, and severe 3V CAD. Transferred to [facility] for CT surgery consultation for possible CABG.   Subjective   No events overnight. Declined to walk with cardiac rehab. Appreciate TCTS consultation and recommendations - plan for CABG/MV repair on Monday. I's/O's stable overnight.  Vitals and labs stable.  Objective   Vitals:   02/16/23 1403 02/16/23 2014 02/17/23 0401 02/17/23 0913  BP: 111/77 (!) 105/56 108/62 107/66  Pulse: 82 84 60 71  Resp: 18 16 16    Temp: (!) 97.4 F (36.3 C) 97.8 F (36.6 C) 97.8 F (36.6 C)   TempSrc: Oral Oral Oral   SpO2: 100% 98% 99%   Weight:   56.2 kg   Height:        Intake/Output Summary (Last 24 hours) at 02/17/2023 0950 Last data filed at 02/17/2023 0200 Gross per 24 hour  Intake 685.22 ml  Output 450 ml  Net 235.22 ml   Filed Weights   02/15/23 0435 02/16/23 0409 02/17/23 0401  Weight: 56.5 kg 56.1 kg 56.2 kg    Physical Exam   General appearance: alert, cachectic, no distress, and pale Neck: no carotid bruit, no JVD, and thyroid not enlarged, symmetric, no tenderness/mass/nodules Lungs: clear to auscultation bilaterally Heart: regular rate and rhythm Abdomen: soft, non-tender; bowel sounds normal; no masses,  no organomegaly Extremities: extremities normal, atraumatic, no cyanosis or edema Pulses: 2+ and symmetric Skin: Skin color, texture, turgor normal. No rashes or lesions Neurologic: Grossly normal Psych: Pleasant  Inpatient Medications    Scheduled Meds:  aspirin  81 mg Oral Daily   atorvastatin  80 mg Oral QPM   [START ON 02/18/2023] bisacodyl  5 mg Oral Once   [START ON 02/19/2023] chlorhexidine  15 mL Mouth/Throat Once   [START ON 02/18/2023] Chlorhexidine Gluconate Cloth  6 each Topical Once   And    [START ON 02/19/2023] Chlorhexidine Gluconate Cloth  6 each Topical Once   [START ON 02/19/2023] diazepam  2 mg Oral Once   digoxin  0.125 mg Oral Daily   furosemide  20 mg Oral Daily   metoprolol succinate  12.5 mg Oral Daily   [START ON 02/19/2023] metoprolol tartrate  12.5 mg Oral Once   sodium chloride flush  3 mL Intravenous Q12H   spironolactone  25 mg Oral Daily    Continuous Infusions:  heparin 1,250 Units/hr (02/16/23 1522)    PRN Meds: acetaminophen, ondansetron (ZOFRAN) IV, sodium chloride flush   Labs   Results for orders placed or performed during the hospital encounter of 02/14/23 (from the past 48 hour(s))  Basic metabolic panel     Status: Abnormal   Collection Time: 02/16/23  4:14 AM  Result Value Ref Range   Sodium 136 135 - 145 mmol/L   Potassium 4.1 3.5 - 5.1 mmol/L   Chloride 103 98 - 111 mmol/L   CO2 25 22 - 32 mmol/L   Glucose, Bld 86 70 - 99 mg/dL    Comment: Glucose reference range applies only to samples taken after fasting for at least 8 hours.   BUN 33 (H) 8 - 23 mg/dL   Creatinine, Ser 4.09 0.61 - 1.24 mg/dL   Calcium 8.6 (L) 8.9 - 10.3 mg/dL   GFR, Estimated >81 >19 mL/min  Comment: (NOTE) Calculated using the CKD-EPI Creatinine Equation (2021)    Anion gap 8 5 - 15    Comment: Performed at [facility], [address], [place], [place] [address]  Heparin level (unfractionated)     Status: None   Collection Time: 02/16/23  4:14 AM  Result Value Ref Range   Heparin Unfractionated 0.50 0.30 - 0.70 IU/mL    Comment: (NOTE) The clinical reportable range upper limit is being lowered to >1.10 to align with the FDA approved guidance for the current laboratory assay.  If heparin results are below expected values, and patient dosage has  been confirmed, suggest follow up testing of antithrombin III levels. Performed at [facility], [address], [place], [place] [address]   CBC     Status: Abnormal   Collection Time: 02/16/23   4:14 AM  Result Value Ref Range   WBC 7.5 4.0 - 10.5 K/uL   RBC 4.62 4.22 - 5.81 MIL/uL   Hemoglobin 12.4 (L) 13.0 - 17.0 g/dL   HCT 75.6 (L) 43.3 - 29.5 %   MCV 82.3 80.0 - 100.0 fL   MCH 26.8 26.0 - 34.0 pg   MCHC 32.6 30.0 - 36.0 g/dL   RDW 18.8 (H) 41.6 - 60.6 %   Platelets 211 150 - 400 K/uL   nRBC 0.0 0.0 - 0.2 %    Comment: Performed at [facility], [address] [place], [place] [address]  ABO/Rh     Status: None   Collection Time: 02/16/23  4:14 AM  Result Value Ref Range   ABO/RH(D)      O POS Performed at [facility], [address] [place], [place] [address]   Type and screen [facility]     Status: None   Collection Time: 02/16/23  6:38 PM  Result Value Ref Range   ABO/RH(D) O POS    Antibody Screen NEG    Sample Expiration      02/19/2023,2359 Performed at [facility], [address] [place], [place] [address]   Urinalysis, Routine w reflex microscopic -Urine, Clean Catch     Status: None   Collection Time: 02/16/23  7:22 PM  Result Value Ref Range   Color, Urine YELLOW YELLOW   APPearance CLEAR CLEAR   Specific Gravity, Urine 1.014 1.005 - 1.030   pH 7.0 5.0 - 8.0   Glucose, UA NEGATIVE NEGATIVE mg/dL   Hgb urine dipstick NEGATIVE NEGATIVE   Bilirubin Urine NEGATIVE NEGATIVE   Ketones, ur NEGATIVE NEGATIVE mg/dL   Protein, ur NEGATIVE NEGATIVE mg/dL   Nitrite NEGATIVE NEGATIVE   Leukocytes,Ua NEGATIVE NEGATIVE    Comment: Performed at [facility], [address], [place], [place] [address]  Basic metabolic panel     Status: Abnormal   Collection Time: 02/17/23  2:59 AM  Result Value Ref Range   Sodium 136 135 - 145 mmol/L   Potassium 3.9 3.5 - 5.1 mmol/L   Chloride 103 98 - 111 mmol/L   CO2 23 22 - 32 mmol/L   Glucose, Bld 91 70 - 99 mg/dL    Comment: Glucose reference range applies only to samples taken after fasting for at least 8 hours.   BUN 17 8 - 23 mg/dL   Creatinine, Ser 4.27 0.61 - 1.24  mg/dL   Calcium 8.7 (L) 8.9 - 10.3 mg/dL   GFR, Estimated >06 >23 mL/min    Comment: (NOTE)  Calculated using the CKD-EPI Creatinine Equation (2021)    Anion gap 10 5 - 15    Comment: Performed at [facility], [address] [place], [place] [address]  Heparin level (unfractionated)     Status: None   Collection Time: 02/17/23  2:59 AM  Result Value Ref Range   Heparin Unfractionated 0.48 0.30 - 0.70 IU/mL    Comment: (NOTE) The clinical reportable range upper limit is being lowered to >1.10 to align with the FDA approved guidance for the current laboratory assay.  If heparin results are below expected values, and patient dosage has  been confirmed, suggest follow up testing of antithrombin III levels. Performed at [facility], [address] [place], [place] [address]   CBC     Status: Abnormal   Collection Time: 02/17/23  2:59 AM  Result Value Ref Range   WBC 7.7 4.0 - 10.5 K/uL   RBC 4.52 4.22 - 5.81 MIL/uL   Hemoglobin 11.9 (L) 13.0 - 17.0 g/dL   HCT 09.8 (L) 11.9 - 14.7 %   MCV 82.7 80.0 - 100.0 fL   MCH 26.3 26.0 - 34.0 pg   MCHC 31.8 30.0 - 36.0 g/dL   RDW 82.9 (H) 56.2 - 13.0 %   Platelets 220 150 - 400 K/uL   nRBC 0.0 0.0 - 0.2 %    Comment: Performed at [facility], [address] [place], [place] [address]    ECG   N/A  Telemetry   Sinus rhythm with frequent PAC's - Personally Reviewed  Radiology    No results found.  Cardiac Studies   STAT ECHO   Indications:    congestive heart failure    History:        Patient has prior history of Echocardiogram examinations,  most                 recent 01/16/2023. Signs/Symptoms:Dyspnea and Edema.    Sonographer:    Delcie Roch RDCS  Referring Phys: [id number] STEVEN C HENDRICKSON   IMPRESSIONS     1. Left ventricular ejection fraction, by estimation, is <20%. The left  ventricle has severely decreased function. The left ventricle demonstrates  global hypokinesis. There is mild  concentric left ventricular hypertrophy.  Left ventricular diastolic  parameters are indeterminate.   2. Right ventricular systolic function is moderately reduced. The right  ventricular size is normal. There is severely elevated pulmonary artery  systolic pressure.   3. Left atrial size was severely dilated.   4. Right atrial size was severely dilated.   5. The mitral valve is normal in structure. Moderate mitral valve  regurgitation. No evidence of mitral stenosis.   6. Tricuspid valve regurgitation is moderate.   7. Abdominal aorta is normal sized. The aortic valve has an indeterminant  number of cusps. Aortic valve regurgitation is not visualized. No aortic  stenosis is present.   8. The inferior vena cava is dilated in size with <50% respiratory  variability, suggesting right atrial pressure of 15 mmHg.   Assessment   Principal Problem:   Heart failure (HCC) Active Problems:   Non-ST elevation (NSTEMI) myocardial infarction [facility])   Plan   Walter. Vargas is doing well - remains on heparin. No shortness of breath or chest pain. Labs and vitals stable. Monitor over the weekend with plans for OR on Monday morning with Dr. Dorris Fetch.  Time Spent Directly with Patient:  I have spent a total of 25 minutes  with the patient reviewing hospital notes, telemetry, EKGs, labs and examining the patient as well as establishing an assessment and plan that was discussed personally with the patient.  > 50% of time was spent in direct patient care.  Length of Stay:  LOS: 3 days   Chrystie Nose, MD, [facility], FACP  [place]  [facility]  Medical Director of the [facility]  [facility] Diplomate of the American Board of Clinical Lipidology Attending Cardiologist  Direct Dial: [phone]  Fax: [phone]  Website:  [email or web address] Nicely Chezney Huether 02/17/2023, 9:50 AM

## 2023-02-18 ENCOUNTER — Other Ambulatory Visit (HOSPITAL_COMMUNITY): Payer: 59

## 2023-02-18 DIAGNOSIS — U071 COVID-19: Secondary | ICD-10-CM | POA: Insufficient documentation

## 2023-02-18 DIAGNOSIS — I5021 Acute systolic (congestive) heart failure: Secondary | ICD-10-CM | POA: Diagnosis not present

## 2023-02-18 DIAGNOSIS — I214 Non-ST elevation (NSTEMI) myocardial infarction: Secondary | ICD-10-CM | POA: Diagnosis not present

## 2023-02-18 LAB — CBC
HCT: 37.3 % — ABNORMAL LOW (ref 39.0–52.0)
Hemoglobin: 11.9 g/dL — ABNORMAL LOW (ref 13.0–17.0)
MCH: 26.9 pg (ref 26.0–34.0)
MCHC: 31.9 g/dL (ref 30.0–36.0)
MCV: 84.4 fL (ref 80.0–100.0)
Platelets: 214 10*3/uL (ref 150–400)
RBC: 4.42 MIL/uL (ref 4.22–5.81)
RDW: 19.7 % — ABNORMAL HIGH (ref 11.5–15.5)
WBC: 5.8 10*3/uL (ref 4.0–10.5)
nRBC: 0 % (ref 0.0–0.2)

## 2023-02-18 LAB — BASIC METABOLIC PANEL
Anion gap: 11 (ref 5–15)
BUN: 16 mg/dL (ref 8–23)
CO2: 23 mmol/L (ref 22–32)
Calcium: 8.9 mg/dL (ref 8.9–10.3)
Chloride: 105 mmol/L (ref 98–111)
Creatinine, Ser: 0.93 mg/dL (ref 0.61–1.24)
GFR, Estimated: >60 mL/min (ref >60)
Glucose, Bld: 101 mg/dL — ABNORMAL HIGH (ref 70–99)
Potassium: 4.1 mmol/L (ref 3.5–5.1)
Sodium: 139 mmol/L (ref 135–145)

## 2023-02-18 LAB — BLOOD GAS, ARTERIAL
Acid-Base Excess: 1.8 mmol/L (ref 0.0–2.0)
Bicarbonate: 25.7 mmol/L (ref 20.0–28.0)
O2 Saturation: 99.2 %
Patient temperature: 36.4
pCO2 arterial: 36 mm[Hg] (ref 32–48)
pH, Arterial: 7.46 — ABNORMAL HIGH (ref 7.35–7.45)
pO2, Arterial: 96 mm[Hg] (ref 83–108)

## 2023-02-18 LAB — HEPARIN LEVEL (UNFRACTIONATED): Heparin Unfractionated: 0.49 [IU]/mL (ref 0.30–0.70)

## 2023-02-18 LAB — SARS CORONAVIRUS 2 (TAT 6-24 HRS): SARS Coronavirus 2: POSITIVE — AB

## 2023-02-18 MED ORDER — NIRMATRELVIR/RITONAVIR (PAXLOVID)TABLET
3.0000 | ORAL_TABLET | Freq: Two times a day (BID) | ORAL | Status: AC
  Administered 2023-02-18 – 2023-02-22 (×10): 3 via ORAL
  Filled 2023-02-18 (×2): qty 30

## 2023-02-18 NOTE — Progress Notes (Signed)
PHARMACY - ANTICOAGULATION CONSULT NOTE  Pharmacy Consult for Heparin Indication: NSTEMI, multi-vessel CAD  No Active Allergies   Patient Measurements: Height: 68"  Weight: 55.7 kg (initial weight 56.5 kg, repeated due to weight 63.7 kg at [facility]) - lasix 60 IV q12h x 4 doses given at [facility] 12/1>>12/3 IBW/kg: 68.4 kg Heparin Dosing Weight: 55.7 kg   Vital Signs: Temp: 97.5 F (36.4 C) (12/08 0406) Temp Source: Oral (12/08 0406) BP: 123/67 (12/08 0406) Pulse Rate: 56 (12/08 0406)  Labs: Recent Labs    02/16/23 0414 02/17/23 0259 02/18/23 0406  HGB 12.4* 11.9* 11.9*  HCT 38.0* 37.4* 37.3*  PLT 211 220 214  HEPARINUNFRC 0.50 0.48 0.49  CREATININE 0.84 0.72 0.93    Estimated Creatinine Clearance: 53.6 mL/min (by C-G formula based on SCr of 0.93 mg/dL).   Medical History: Past Medical History:  Diagnosis Date   Essential hypertension    HFrEF (heart failure with reduced ejection fraction) (HCC)    Mitral regurgitation     Assessment: Patient is a 77 y.o. male who presented to the ED on 02/10/2023 with SOB and chest pain. PMH includes HFrEF with EF < 20 RV mod reduced and mildly enlarged, mod pulm artery pressure, LA mod dilated, RA mildly dilated, mod pleural effusion, severe MVR. On admission BNP > 4,500 and troponins 13,606. 12/3 taken for Right/Left heart cath and coronary angiography. Showed severe three-vessel disease. Post procedure recommends CABG +/- MVR. Pharmacy consulted to dose and manage IV heparin.   Heparin level remains therapeutic on heparin infusion at 1250 units/hr. Hgb 11.9, plt 214. No s/sx of bleeding or infusion issues.    Goal of Therapy:  Heparin level 0.3-0.7 units/ml Monitor platelets by anticoagulation protocol: Yes   Plan:  Continue heparin infusion at 1250 units/hr Daily heparin level and CBC. Scheduled for CABG 12/9  Thank you for allowing pharmacy to participate in this patient's care,  [employer], Pharm.D., BCPS Clinical  Pharmacist  **Pharmacist phone directory can be found on [email or web address] listed under [facility].  02/18/2023 9:58 AM

## 2023-02-18 NOTE — Progress Notes (Signed)
Mobility Specialist Progress Note:   02/18/23 0920  Mobility  Activity Ambulated with assistance in hallway  Level of Assistance Standby assist, set-up cues, supervision of patient - no hands on  Assistive Device None  Distance Ambulated (ft) 250 ft  Activity Response Tolerated well  Mobility Referral Yes  Mobility visit 1 Mobility  Mobility Specialist Start Time (ACUTE ONLY) 0920  Mobility Specialist Stop Time (ACUTE ONLY) 0930  Mobility Specialist Time Calculation (min) (ACUTE ONLY) 10 min   Pt agreeable to mobility session. Required only supervision for safety with no AD use. C/o L hip pain which is chronic per pt. Back sitting EOB with all needs met.   Addison Lank Mobility Specialist Please contact via SecureChat or  Rehab office at [phone]

## 2023-02-18 NOTE — Progress Notes (Signed)
DAILY PROGRESS NOTE   Patient Name: Walter Vargas Date of Encounter: 02/18/2023 Cardiologist: None  Chief Complaint   No chest pain or dyspnea  Patient Profile   Walter Vargas is a 77 year old history of HTN, Walter, HFrEF, and severe 3V CAD. Transferred to [facility] for CT surgery consultation for possible CABG.   Subjective   No issues overnight - plan for surgery tomorrow. Noted to test positive for COVID via nasal swab yesterday, ordered apparently for pre-op. Has had a productive cough, no fevers. Not hypoxic.   Objective   Vitals:   02/17/23 1713 02/17/23 2000 02/18/23 0406 02/18/23 1239  BP: 100/60 (!) 101/58 123/67 115/67  Pulse: 73 64 (!) 56 68  Resp: 16 16 16 16   Temp: 97.9 F (36.6 C) 98.4 F (36.9 C) (!) 97.5 F (36.4 C) 98 F (36.7 C)  TempSrc: Oral Oral Oral Oral  SpO2: 100% 100% 100% 100%  Weight:   57 kg   Height:        Intake/Output Summary (Last 24 hours) at 02/18/2023 1301 Last data filed at 02/18/2023 0700 Gross per 24 hour  Intake 605.25 ml  Output 450 ml  Net 155.25 ml   Filed Weights   02/16/23 0409 02/17/23 0401 02/18/23 0406  Weight: 56.1 kg 56.2 kg 57 kg    Physical Exam   General appearance: alert, cachectic, no distress, and pale Neck: no carotid bruit, no JVD, and thyroid not enlarged, symmetric, no tenderness/mass/nodules Lungs: diminished breath sounds bilaterally and rhonchi bilaterally Heart: regular rate and rhythm Abdomen: soft, non-tender; bowel sounds normal; no masses,  no organomegaly Extremities: extremities normal, atraumatic, no cyanosis or edema Pulses: 2+ and symmetric Skin: Skin color, texture, turgor normal. No rashes or lesions Neurologic: Grossly normal Psych: Pleasant  Inpatient Medications    Scheduled Meds:  aspirin  81 mg Oral Daily   bisacodyl  5 mg Oral Once   [START ON 02/19/2023] chlorhexidine  15 mL Mouth/Throat Once   Chlorhexidine Gluconate Cloth  6 each Topical Once   And   [START ON 02/19/2023]  Chlorhexidine Gluconate Cloth  6 each Topical Once   [START ON 02/19/2023] diazepam  2 mg Oral Once   digoxin  0.125 mg Oral Daily   [START ON 02/19/2023] epinephrine  0-10 mcg/min Intravenous To OR   furosemide  20 mg Oral Daily   [START ON 02/19/2023] heparin sodium (porcine) 2,500 Units, papaverine 30 mg in electrolyte-A (PLASMALYTE-A PH 7.4) 500 mL irrigation   Irrigation To OR   [START ON 02/19/2023] insulin   Intravenous To OR   [START ON 02/19/2023] [place] Blood Cardioplegia vial (lidocaine/magnesium/mannitol 0.26g-4g-6.4g)   Intracoronary To OR   metoprolol succinate  12.5 mg Oral Daily   [START ON 02/19/2023] metoprolol tartrate  12.5 mg Oral Once   nirmatrelvir/ritonavir  3 tablet Oral BID   [START ON 02/19/2023] phenylephrine  30-200 mcg/min Intravenous To OR   [START ON 02/19/2023] potassium chloride  80 mEq Other To OR   sodium chloride flush  3 mL Intravenous Q12H   spironolactone  25 mg Oral Daily   [START ON 02/19/2023] tranexamic acid  15 mg/kg Intravenous To OR   [START ON 02/19/2023] tranexamic acid  2 mg/kg Intracatheter To OR    Continuous Infusions:  [START ON 02/19/2023]  ceFAZolin (ANCEF) IV     [START ON 02/19/2023]  ceFAZolin (ANCEF) IV     [START ON 02/19/2023] dexmedetomidine     [START ON 02/19/2023] heparin 30,000 units/NS  1000 mL solution for CELLSAVER     heparin 1,250 Units/hr (02/18/23 0853)   [START ON 02/19/2023] milrinone     [START ON 02/19/2023] nitroGLYCERIN     [START ON 02/19/2023] norepinephrine     [START ON 02/19/2023] tranexamic acid (CYKLOKAPRON) 2,500 mg in sodium chloride 0.9 % 250 mL (10 mg/mL) infusion     [START ON 02/19/2023] vancomycin      PRN Meds: acetaminophen, ondansetron (ZOFRAN) IV, sodium chloride flush   Labs   Results for orders placed or performed during the hospital encounter of 02/14/23 (from the past 48 hour(s))  Type and screen [facility]     Status: None   Collection Time: 02/16/23  6:38 PM  Result Value  Ref Range   ABO/RH(D) O POS    Antibody Screen NEG    Sample Expiration      02/19/2023,2359 Performed at [facility], [address], [place], [place] [address]   Urinalysis, Routine w reflex microscopic -Urine, Clean Catch     Status: None   Collection Time: 02/16/23  7:22 PM  Result Value Ref Range   Color, Urine YELLOW YELLOW   APPearance CLEAR CLEAR   Specific Gravity, Urine 1.014 1.005 - 1.030   pH 7.0 5.0 - 8.0   Glucose, UA NEGATIVE NEGATIVE mg/dL   Hgb urine dipstick NEGATIVE NEGATIVE   Bilirubin Urine NEGATIVE NEGATIVE   Ketones, ur NEGATIVE NEGATIVE mg/dL   Protein, ur NEGATIVE NEGATIVE mg/dL   Nitrite NEGATIVE NEGATIVE   Leukocytes,Ua NEGATIVE NEGATIVE    Comment: Performed at [facility], [address], [place], [place] [address]  Basic metabolic panel     Status: Abnormal   Collection Time: 02/17/23  2:59 AM  Result Value Ref Range   Sodium 136 135 - 145 mmol/L   Potassium 3.9 3.5 - 5.1 mmol/L   Chloride 103 98 - 111 mmol/L   CO2 23 22 - 32 mmol/L   Glucose, Bld 91 70 - 99 mg/dL    Comment: Glucose reference range applies only to samples taken after fasting for at least 8 hours.   BUN 17 8 - 23 mg/dL   Creatinine, Ser 4.78 0.61 - 1.24 mg/dL   Calcium 8.7 (L) 8.9 - 10.3 mg/dL   GFR, Estimated >29 >56 mL/min    Comment: (NOTE) Calculated using the CKD-EPI Creatinine Equation (2021)    Anion gap 10 5 - 15    Comment: Performed at [facility], [address], [place], [place] [address]  Heparin level (unfractionated)     Status: None   Collection Time: 02/17/23  2:59 AM  Result Value Ref Range   Heparin Unfractionated 0.48 0.30 - 0.70 IU/mL    Comment: (NOTE) The clinical reportable range upper limit is being lowered to >1.10 to align with the FDA approved guidance for the current laboratory assay.  If heparin results are below expected values, and patient dosage has  been confirmed, suggest follow up testing of antithrombin III  levels. Performed at [facility], [address], [place], [place] [address]   CBC     Status: Abnormal   Collection Time: 02/17/23  2:59 AM  Result Value Ref Range   WBC 7.7 4.0 - 10.5 K/uL   RBC 4.52 4.22 - 5.81 MIL/uL   Hemoglobin 11.9 (L) 13.0 - 17.0 g/dL   HCT 69.6 (L) 29.5 - 28.4 %   MCV 82.7 80.0 - 100.0 fL   MCH  26.3 26.0 - 34.0 pg   MCHC 31.8 30.0 - 36.0 g/dL   RDW 82.9 (H) 56.2 - 13.0 %   Platelets 220 150 - 400 K/uL   nRBC 0.0 0.0 - 0.2 %    Comment: Performed at [facility], [address], [place], [place] [address]  Surgical pcr screen     Status: None   Collection Time: 02/17/23  9:00 AM   Specimen: Nasal Mucosa; Nasal Swab  Result Value Ref Range   MRSA, PCR NEGATIVE NEGATIVE   Staphylococcus aureus NEGATIVE NEGATIVE    Comment: (NOTE) The Xpert SA Assay (FDA approved for NASAL specimens in patients 36 years of age and older), is one component of a comprehensive surveillance program. It is not intended to diagnose infection nor to guide or monitor treatment. Performed at [facility], [address] [place], [place] [address]   SARS CORONAVIRUS 2 (TAT 6-24 HRS) Anterior Nasal Swab     Status: Abnormal   Collection Time: 02/17/23  9:18 AM   Specimen: Anterior Nasal Swab  Result Value Ref Range   SARS Coronavirus 2 POSITIVE (A) NEGATIVE    Comment: (NOTE) SARS-CoV-2 target nucleic acids are DETECTED.  The SARS-CoV-2 RNA is generally detectable in upper and lower respiratory specimens during the acute phase of infection. Positive results are indicative of the presence of SARS-CoV-2 RNA. Clinical correlation with patient history and other diagnostic information is  necessary to determine patient infection status. Positive results do not rule out bacterial infection or co-infection with other viruses.  The expected result is Negative.  Fact Sheet for Patients: HairSlick.no  Fact Sheet for Healthcare  Providers: quierodirigir.com  This test is not yet approved or cleared by the [place] FDA and  has been authorized for detection and/or diagnosis of SARS-CoV-2 by FDA under an Emergency Use Authorization (EUA). This EUA will remain  in effect (meaning this test can be used) for the duration of the COVID-19 declaration under Section 564(b)(1) of the Act, 21 U. S.C. section 360bbb-3(b)(1), unless the authorization is terminated or revoked sooner.   Performed at [facility], [address] [place], [place] [address]   Glucose, capillary     Status: Abnormal   Collection Time: 02/17/23  9:09 PM  Result Value Ref Range   Glucose-Capillary 130 (H) 70 - 99 mg/dL    Comment: Glucose reference range applies only to samples taken after fasting for at least 8 hours.  Basic metabolic panel     Status: Abnormal   Collection Time: 02/18/23  4:06 AM  Result Value Ref Range   Sodium 139 135 - 145 mmol/L   Potassium 4.1 3.5 - 5.1 mmol/L   Chloride 105 98 - 111 mmol/L   CO2 23 22 - 32 mmol/L   Glucose, Bld 101 (H) 70 - 99 mg/dL    Comment: Glucose reference range applies only to samples taken after fasting for at least 8 hours.   BUN 16 8 - 23 mg/dL   Creatinine, Ser 1.32 0.61 - 1.24 mg/dL   Calcium 8.9 8.9 - 44.0 mg/dL   GFR, Estimated >10 >27 mL/min    Comment: (NOTE) Calculated using the CKD-EPI Creatinine Equation (2021)    Anion gap 11 5 - 15    Comment: Performed at [facility], [address] [place], [place] [address]  Heparin level (unfractionated)     Status: None   Collection Time: 02/18/23  4:06 AM  Result Value  Ref Range   Heparin Unfractionated 0.49 0.30 - 0.70 IU/mL    Comment: (NOTE) The clinical reportable range upper limit is being lowered to >1.10 to align with the FDA approved guidance for the current laboratory assay.  If heparin results are below expected values, and patient dosage has  been confirmed, suggest follow up  testing of antithrombin III levels. Performed at [facility], [address] [place], [place] [address]   CBC     Status: Abnormal   Collection Time: 02/18/23  4:06 AM  Result Value Ref Range   WBC 5.8 4.0 - 10.5 K/uL   RBC 4.42 4.22 - 5.81 MIL/uL   Hemoglobin 11.9 (L) 13.0 - 17.0 g/dL   HCT 60.4 (L) 54.0 - 98.1 %   MCV 84.4 80.0 - 100.0 fL   MCH 26.9 26.0 - 34.0 pg   MCHC 31.9 30.0 - 36.0 g/dL   RDW 19.1 (H) 47.8 - 29.5 %   Platelets 214 150 - 400 K/uL   nRBC 0.0 0.0 - 0.2 %    Comment: Performed at [facility], [address], [place], [place] [address]  Blood gas, arterial     Status: Abnormal   Collection Time: 02/18/23  5:00 AM  Result Value Ref Range   pH, Arterial 7.46 (H) 7.35 - 7.45   pCO2 arterial 36 32 - 48 mmHg   pO2, Arterial 96 83 - 108 mmHg   Bicarbonate 25.7 20.0 - 28.0 mmol/L   Acid-Base Excess 1.8 0.0 - 2.0 mmol/L   O2 Saturation 99.2 %   Patient temperature 36.4    Collection site RIGHT RADIAL    Allens test (pass/fail) PASS PASS    Comment: Performed at [facility], [address] [place], [place] [address]    ECG   N/A  Telemetry   Sinus rhythm with frequent PAC's - Personally Reviewed  Radiology    DG Chest 2 View  Result Date: 02/17/2023 CLINICAL DATA:  77 year old male preoperative study for CABG. EXAM: CHEST - 2 VIEW COMPARISON:  CTA chest 02/10/2023 and earlier. FINDINGS: Bilateral pleural effusions appear regressed but not resolved, now small. Calcified aortic atherosclerosis. Heart size within normal limits. Other mediastinal contours are within normal limits. Visualized tracheal air column is within normal limits. No pneumothorax or pulmonary edema. No consolidation. Osteopenia. No acute osseous abnormality identified. Scoliosis. Negative visible bowel gas. IMPRESSION: 1. Regressed pleural effusions since last month, now small. 2. No other acute cardiopulmonary abnormality. Electronically Signed   By: Odessa Fleming M.D.   On:  02/17/2023 10:20    Cardiac Studies   STAT ECHO   Indications:    congestive heart failure    History:        Patient has prior history of Echocardiogram examinations,  most                 recent 01/16/2023. Signs/Symptoms:Dyspnea and Edema.    Sonographer:    Delcie Roch RDCS  Referring Phys: [id number] STEVEN C HENDRICKSON   IMPRESSIONS     1. Left ventricular ejection fraction, by estimation, is <20%. The left  ventricle has severely decreased function. The left ventricle demonstrates  global hypokinesis. There is mild concentric left ventricular hypertrophy.  Left ventricular diastolic  parameters are indeterminate.   2. Right ventricular systolic function is moderately reduced. The right  ventricular size is normal. There is severely elevated pulmonary artery  systolic pressure.   3. Left atrial size was severely  dilated.   4. Right atrial size was severely dilated.   5. The mitral valve is normal in structure. Moderate mitral valve  regurgitation. No evidence of mitral stenosis.   6. Tricuspid valve regurgitation is moderate.   7. Abdominal aorta is normal sized. The aortic valve has an indeterminant  number of cusps. Aortic valve regurgitation is not visualized. No aortic  stenosis is present.   8. The inferior vena cava is dilated in size with <50% respiratory  variability, suggesting right atrial pressure of 15 mmHg.   Assessment   Principal Problem:   Heart failure (HCC) Active Problems:   Non-ST elevation (NSTEMI) myocardial infarction (HCC)   COVID-19 virus infection   Plan   Walter. Guckert was found by nasal swab to be positive for COVID 19. Labs and vitals stable, but he has a productive cough. D/w hospital medicine - Dr. Janee Morn, noted to not be hypoxic - suspect this is a mild case. Given low EF and planned surgery, will start Paxlovid. On airborne and contact precautions. Hold atorvastatin given elevated liver enzymes (AST/ALT 193/106) and planned therapy  with Paxlovid.  Will message reach out to CT surgery to make them aware.  Time Spent Directly with Patient:  I have spent a total of 25 minutes with the patient reviewing hospital notes, telemetry, EKGs, labs and examining the patient as well as establishing an assessment and plan that was discussed personally with the patient.  > 50% of time was spent in direct patient care.  Length of Stay:  LOS: 4 days   Chrystie Nose, MD, [facility], FACP  [place]  [facility]  Medical Director of the [facility] Lipid [facility]  Cardiovascular Risk Reduction [facility] Diplomate of the American Board of Clinical Lipidology Attending Cardiologist  Direct Dial: [phone]  Fax: [phone]  Website:  [email or web address] Nicely Amena Dockham 02/18/2023, 1:01 PM

## 2023-02-18 NOTE — Plan of Care (Signed)
Problem: Education: Goal: Knowledge of General Education information will improve Description: Including pain rating scale, medication(s)/side effects and non-pharmacologic comfort measures Outcome: Progressing   Problem: Health Behavior/Discharge Planning: Goal: Ability to manage health-related needs will improve Outcome: Progressing   Problem: Clinical Measurements: Goal: Ability to maintain clinical measurements within normal limits will improve Outcome: Progressing Goal: Will remain free from infection Outcome: Progressing Goal: Diagnostic test results will improve Outcome: Progressing Goal: Respiratory complications will improve Outcome: Progressing Goal: Cardiovascular complication will be avoided Outcome: Progressing   Problem: Activity: Goal: Risk for activity intolerance will decrease Outcome: Progressing   Problem: Nutrition: Goal: Adequate nutrition will be maintained Outcome: Progressing   Problem: Coping: Goal: Level of anxiety will decrease Outcome: Progressing   Problem: Elimination: Goal: Will not experience complications related to bowel motility Outcome: Progressing Goal: Will not experience complications related to urinary retention Outcome: Progressing   Problem: Pain Management: Goal: General experience of comfort will improve Outcome: Progressing   Problem: Safety: Goal: Ability to remain free from injury will improve Outcome: Progressing   Problem: Skin Integrity: Goal: Risk for impaired skin integrity will decrease Outcome: Progressing   Problem: Education: Goal: Knowledge of disease or condition will improve Outcome: Progressing Goal: Understanding of medication regimen will improve Outcome: Progressing Goal: Individualized Educational Video(s) Outcome: Progressing   Problem: Activity: Goal: Ability to tolerate increased activity will improve Outcome: Progressing   Problem: Cardiac: Goal: Ability to achieve and maintain  adequate cardiopulmonary perfusion will improve Outcome: Progressing   Problem: Health Behavior/Discharge Planning: Goal: Ability to safely manage health-related needs after discharge will improve Outcome: Progressing   Problem: Education: Goal: Will demonstrate proper wound care and an understanding of methods to prevent future damage Outcome: Progressing Goal: Knowledge of disease or condition will improve Outcome: Progressing Goal: Knowledge of the prescribed therapeutic regimen will improve Outcome: Progressing Goal: Individualized Educational Video(s) Outcome: Progressing   Problem: Activity: Goal: Risk for activity intolerance will decrease Outcome: Progressing   Problem: Cardiac: Goal: Will achieve and/or maintain hemodynamic stability Outcome: Progressing   Problem: Clinical Measurements: Goal: Postoperative complications will be avoided or minimized Outcome: Progressing   Problem: Respiratory: Goal: Respiratory status will improve Outcome: Progressing   Problem: Skin Integrity: Goal: Wound healing without signs and symptoms of infection Outcome: Progressing Goal: Risk for impaired skin integrity will decrease Outcome: Progressing   Problem: Urinary Elimination: Goal: Ability to achieve and maintain adequate renal perfusion and functioning will improve Outcome: Progressing   Problem: Education: Goal: Knowledge of General Education information will improve Description: Including pain rating scale, medication(s)/side effects and non-pharmacologic comfort measures Outcome: Progressing   Problem: Health Behavior/Discharge Planning: Goal: Ability to manage health-related needs will improve Outcome: Progressing   Problem: Clinical Measurements: Goal: Ability to maintain clinical measurements within normal limits will improve Outcome: Progressing Goal: Will remain free from infection Outcome: Progressing Goal: Diagnostic test results will improve Outcome:  Progressing Goal: Respiratory complications will improve Outcome: Progressing Goal: Cardiovascular complication will be avoided Outcome: Progressing   Problem: Activity: Goal: Risk for activity intolerance will decrease Outcome: Progressing   Problem: Nutrition: Goal: Adequate nutrition will be maintained Outcome: Progressing   Problem: Coping: Goal: Level of anxiety will decrease Outcome: Progressing   Problem: Elimination: Goal: Will not experience complications related to bowel motility Outcome: Progressing Goal: Will not experience complications related to urinary retention Outcome: Progressing   Problem: Pain Management: Goal: General experience of comfort will improve Outcome: Progressing   Problem: Safety: Goal: Ability to remain free from injury  will improve Outcome: Progressing   Problem: Skin Integrity: Goal: Risk for impaired skin integrity will decrease Outcome: Progressing   Problem: Education: Goal: Knowledge of risk factors and measures for prevention of condition will improve Outcome: Progressing   Problem: Coping: Goal: Psychosocial and spiritual needs will be supported Outcome: Progressing   Problem: Respiratory: Goal: Will maintain a patent airway Outcome: Progressing Goal: Complications related to the disease process, condition or treatment will be avoided or minimized Outcome: Progressing

## 2023-02-19 ENCOUNTER — Other Ambulatory Visit (HOSPITAL_COMMUNITY): Payer: 59

## 2023-02-19 DIAGNOSIS — I340 Nonrheumatic mitral (valve) insufficiency: Secondary | ICD-10-CM | POA: Diagnosis not present

## 2023-02-19 DIAGNOSIS — I2510 Atherosclerotic heart disease of native coronary artery without angina pectoris: Secondary | ICD-10-CM | POA: Diagnosis not present

## 2023-02-19 DIAGNOSIS — I5021 Acute systolic (congestive) heart failure: Secondary | ICD-10-CM | POA: Diagnosis not present

## 2023-02-19 LAB — COMPREHENSIVE METABOLIC PANEL
ALT: 42 U/L (ref 0–44)
AST: 25 U/L (ref 15–41)
Albumin: 2.9 g/dL — ABNORMAL LOW (ref 3.5–5.0)
Alkaline Phosphatase: 51 U/L (ref 38–126)
Anion gap: 9 (ref 5–15)
BUN: 15 mg/dL (ref 8–23)
CO2: 22 mmol/L (ref 22–32)
Calcium: 8.5 mg/dL — ABNORMAL LOW (ref 8.9–10.3)
Chloride: 103 mmol/L (ref 98–111)
Creatinine, Ser: 0.9 mg/dL (ref 0.61–1.24)
GFR, Estimated: >60 mL/min (ref >60)
Glucose, Bld: 84 mg/dL (ref 70–99)
Potassium: 4.1 mmol/L (ref 3.5–5.1)
Sodium: 134 mmol/L — ABNORMAL LOW (ref 135–145)
Total Bilirubin: 1 mg/dL (ref <1.2)
Total Protein: 6.1 g/dL — ABNORMAL LOW (ref 6.5–8.1)

## 2023-02-19 LAB — TYPE AND SCREEN
ABO/RH(D): O POS
Antibody Screen: NEGATIVE

## 2023-02-19 LAB — CBC WITH DIFFERENTIAL/PLATELET
Abs Immature Granulocytes: 0.03 10*3/uL (ref 0.00–0.07)
Basophils Absolute: 0 10*3/uL (ref 0.0–0.1)
Basophils Relative: 0 %
Eosinophils Absolute: 0.1 10*3/uL (ref 0.0–0.5)
Eosinophils Relative: 1 %
HCT: 34.5 % — ABNORMAL LOW (ref 39.0–52.0)
Hemoglobin: 11.1 g/dL — ABNORMAL LOW (ref 13.0–17.0)
Immature Granulocytes: 0 %
Lymphocytes Relative: 14 %
Lymphs Abs: 1.3 10*3/uL (ref 0.7–4.0)
MCH: 26.7 pg (ref 26.0–34.0)
MCHC: 32.2 g/dL (ref 30.0–36.0)
MCV: 82.9 fL (ref 80.0–100.0)
Monocytes Absolute: 0.6 10*3/uL (ref 0.1–1.0)
Monocytes Relative: 7 %
Neutro Abs: 7 10*3/uL (ref 1.7–7.7)
Neutrophils Relative %: 78 %
Platelets: 230 10*3/uL (ref 150–400)
RBC: 4.16 MIL/uL — ABNORMAL LOW (ref 4.22–5.81)
RDW: 19.5 % — ABNORMAL HIGH (ref 11.5–15.5)
WBC: 8.9 10*3/uL (ref 4.0–10.5)
nRBC: 0 % (ref 0.0–0.2)

## 2023-02-19 LAB — PROTIME-INR
INR: 1.2 (ref 0.8–1.2)
Prothrombin Time: 15.8 s — ABNORMAL HIGH (ref 11.4–15.2)

## 2023-02-19 LAB — HEPARIN LEVEL (UNFRACTIONATED): Heparin Unfractionated: 0.35 [IU]/mL (ref 0.30–0.70)

## 2023-02-19 LAB — HEMOGLOBIN A1C
Hgb A1c MFr Bld: 6 % — ABNORMAL HIGH (ref 4.8–5.6)
Mean Plasma Glucose: 125.5 mg/dL

## 2023-02-19 LAB — APTT: aPTT: 104 s — ABNORMAL HIGH (ref 24–36)

## 2023-02-19 MED ORDER — FUROSEMIDE 20 MG PO TABS
20.0000 mg | ORAL_TABLET | Freq: Once | ORAL | Status: AC
  Administered 2023-02-19: 20 mg via ORAL
  Filled 2023-02-19: qty 1

## 2023-02-19 NOTE — Plan of Care (Signed)
Problem: Education: Goal: Knowledge of General Education information will improve Description: Including pain rating scale, medication(s)/side effects and non-pharmacologic comfort measures Outcome: Progressing   Problem: Health Behavior/Discharge Planning: Goal: Ability to manage health-related needs will improve Outcome: Progressing   Problem: Clinical Measurements: Goal: Ability to maintain clinical measurements within normal limits will improve Outcome: Progressing Goal: Will remain free from infection Outcome: Progressing Goal: Diagnostic test results will improve Outcome: Progressing Goal: Respiratory complications will improve Outcome: Progressing Goal: Cardiovascular complication will be avoided Outcome: Progressing   Problem: Activity: Goal: Risk for activity intolerance will decrease Outcome: Progressing   Problem: Nutrition: Goal: Adequate nutrition will be maintained Outcome: Progressing   Problem: Coping: Goal: Level of anxiety will decrease Outcome: Progressing   Problem: Elimination: Goal: Will not experience complications related to bowel motility Outcome: Progressing Goal: Will not experience complications related to urinary retention Outcome: Progressing   Problem: Pain Management: Goal: General experience of comfort will improve Outcome: Progressing   Problem: Safety: Goal: Ability to remain free from injury will improve Outcome: Progressing   Problem: Skin Integrity: Goal: Risk for impaired skin integrity will decrease Outcome: Progressing   Problem: Education: Goal: Knowledge of disease or condition will improve Outcome: Progressing Goal: Understanding of medication regimen will improve Outcome: Progressing Goal: Individualized Educational Video(s) Outcome: Progressing   Problem: Activity: Goal: Ability to tolerate increased activity will improve Outcome: Progressing   Problem: Cardiac: Goal: Ability to achieve and maintain  adequate cardiopulmonary perfusion will improve Outcome: Progressing   Problem: Health Behavior/Discharge Planning: Goal: Ability to safely manage health-related needs after discharge will improve Outcome: Progressing   Problem: Education: Goal: Will demonstrate proper wound care and an understanding of methods to prevent future damage Outcome: Progressing Goal: Knowledge of disease or condition will improve Outcome: Progressing Goal: Knowledge of the prescribed therapeutic regimen will improve Outcome: Progressing Goal: Individualized Educational Video(s) Outcome: Progressing   Problem: Activity: Goal: Risk for activity intolerance will decrease Outcome: Progressing   Problem: Cardiac: Goal: Will achieve and/or maintain hemodynamic stability Outcome: Progressing   Problem: Clinical Measurements: Goal: Postoperative complications will be avoided or minimized Outcome: Progressing   Problem: Respiratory: Goal: Respiratory status will improve Outcome: Progressing   Problem: Skin Integrity: Goal: Wound healing without signs and symptoms of infection Outcome: Progressing Goal: Risk for impaired skin integrity will decrease Outcome: Progressing   Problem: Urinary Elimination: Goal: Ability to achieve and maintain adequate renal perfusion and functioning will improve Outcome: Progressing   Problem: Education: Goal: Knowledge of General Education information will improve Description: Including pain rating scale, medication(s)/side effects and non-pharmacologic comfort measures Outcome: Progressing   Problem: Health Behavior/Discharge Planning: Goal: Ability to manage health-related needs will improve Outcome: Progressing   Problem: Clinical Measurements: Goal: Ability to maintain clinical measurements within normal limits will improve Outcome: Progressing Goal: Will remain free from infection Outcome: Progressing Goal: Diagnostic test results will improve Outcome:  Progressing Goal: Respiratory complications will improve Outcome: Progressing Goal: Cardiovascular complication will be avoided Outcome: Progressing   Problem: Activity: Goal: Risk for activity intolerance will decrease Outcome: Progressing   Problem: Nutrition: Goal: Adequate nutrition will be maintained Outcome: Progressing   Problem: Coping: Goal: Level of anxiety will decrease Outcome: Progressing   Problem: Elimination: Goal: Will not experience complications related to bowel motility Outcome: Progressing Goal: Will not experience complications related to urinary retention Outcome: Progressing   Problem: Pain Management: Goal: General experience of comfort will improve Outcome: Progressing   Problem: Safety: Goal: Ability to remain free from injury  will improve Outcome: Progressing   Problem: Skin Integrity: Goal: Risk for impaired skin integrity will decrease Outcome: Progressing   Problem: Education: Goal: Knowledge of risk factors and measures for prevention of condition will improve Outcome: Progressing   Problem: Coping: Goal: Psychosocial and spiritual needs will be supported Outcome: Progressing   Problem: Respiratory: Goal: Will maintain a patent airway Outcome: Progressing Goal: Complications related to the disease process, condition or treatment will be avoided or minimized Outcome: Progressing

## 2023-02-19 NOTE — Progress Notes (Signed)
Procedure(s) (LRB): CORONARY ARTERY BYPASS GRAFTING (CABG) (N/A) possible MITRAL VALVE REPAIR or replacement (MVR) (N/A) TRANSESOPHAGEAL ECHOCARDIOGRAM (TEE) (N/A) Subjective: Cancelled CABG due to testing + for COVID + cough but no respiratory distress or hypoxia  Objective: Vital signs in last 24 hours: Temp:  [97.6 F (36.4 C)-97.9 F (36.6 C)] 97.6 F (36.4 C) (12/09 0821) Pulse Rate:  [64-97] 75 (12/09 0821) Cardiac Rhythm: Normal sinus rhythm;Bundle branch block (12/09 0819) Resp:  [16-20] 16 (12/09 0821) BP: (94-138)/(63-88) 110/74 (12/09 0821) SpO2:  [94 %-97 %] 97 % (12/09 0821) Weight:  [58.4 kg] 58.4 kg (12/09 0350)  Hemodynamic parameters for last 24 hours:    Intake/Output from previous day: 12/08 0701 - 12/09 0700 In: 506.1 [P.O.:240; I.V.:266.1] Out: 300 [Urine:300] Intake/Output this shift: Total I/O In: 236 [P.O.:236] Out: -   General appearance: alert, cooperative, and no distress Heart: regular rate and rhythm  Lab Results: Recent Labs    02/18/23 0406 02/19/23 0327  WBC 5.8 8.9  HGB 11.9* 11.1*  HCT 37.3* 34.5*  PLT 214 230   BMET:  Recent Labs    02/18/23 0406 02/19/23 0327  NA 139 134*  K 4.1 4.1  CL 105 103  CO2 23 22  GLUCOSE 101* 84  BUN 16 15  CREATININE 0.93 0.90  CALCIUM 8.9 8.5*    PT/INR:  Recent Labs    02/19/23 0327  LABPROT 15.8*  INR 1.2   ABG    Component Value Date/Time   PHART 7.46 (H) 02/18/2023 0500   HCO3 25.7 02/18/2023 0500   TCO2 26 02/13/2023 1327   O2SAT 99.2 02/18/2023 0500   CBG (last 3)  Recent Labs    02/17/23 2109  GLUCAP 130*    Assessment/Plan: S/P Procedure(s) (LRB): CORONARY ARTERY BYPASS GRAFTING (CABG) (N/A) possible MITRAL VALVE REPAIR or replacement (MVR) (N/A) TRANSESOPHAGEAL ECHOCARDIOGRAM (TEE) (N/A) - Started on paxlovid for + COVID test Will need to wait 10 days piror to surgery He is anxious about home matters including paying bills, etc. Tentative plan for CABG  next Thursday 12/19   LOS: 5 days    [name] 02/19/2023

## 2023-02-19 NOTE — Progress Notes (Addendum)
Advanced Heart Failure Rounding Note  PCP-Cardiologist: None   Subjective:   Transferred to [facility] for CT surgery consultation for possible CABG   Was scheduled for CABG today but canceled due to being COVID +. Now on Plaxlovid.   Wt trending up slowly last several days, from low of 122 lb to 128 lb today. I/Os net +. SCr stable 0.90. Na 134.   Denies dyspnea. + dry cough. No fever or chills. Denies CP    Worried about getting home to pay bills. Worried his power may be off by the time he is discharged if payment is not made on time.    Objective:   Weight Range: 58.4 kg Body mass index is 19.58 kg/m.   Vital Signs:   Temp:  [97.6 F (36.4 C)-98 F (36.7 C)] 97.6 F (36.4 C) (12/09 0821) Pulse Rate:  [64-97] 75 (12/09 0821) Resp:  [16-20] 16 (12/09 0821) BP: (94-138)/(63-88) 110/74 (12/09 0821) SpO2:  [94 %-100 %] 97 % (12/09 0821) Weight:  [58.4 kg] 58.4 kg (12/09 0350) Last BM Date : 02/16/23  Weight change: Filed Weights   02/17/23 0401 02/18/23 0406 02/19/23 0350  Weight: 56.2 kg 57 kg 58.4 kg    Intake/Output:   Intake/Output Summary (Last 24 hours) at 02/19/2023 1006 Last data filed at 02/19/2023 1610 Gross per 24 hour  Intake 742.09 ml  Output 300 ml  Net 442.09 ml    Physical Exam   General:  Well appearing elderly male, sitting up on side of bed. No respiratory difficulty HEENT: normal Neck: supple. JVD 7 cm. Carotids 2+ bilat; no bruits. No lymphadenopathy or thyromegaly appreciated. Cor: PMI nondisplaced. Regular rate & rhythm. No rubs, gallops or murmurs. Lungs: clear Abdomen: soft, nontender, nondistended. No hepatosplenomegaly. No bruits or masses. Good bowel sounds. Extremities: no cyanosis, clubbing, rash, edema Neuro: alert & oriented x 3, cranial nerves grossly intact. moves all 4 extremities w/o difficulty. Affect pleasant.   Telemetry   NSR 80s (Personally reviewed)    EKG    No new EKG to review  Labs    CBC Recent  Labs    02/18/23 0406 02/19/23 0327  WBC 5.8 8.9  NEUTROABS  --  7.0  HGB 11.9* 11.1*  HCT 37.3* 34.5*  MCV 84.4 82.9  PLT 214 230   Basic Metabolic Panel Recent Labs    96/04/54 0406 02/19/23 0327  NA 139 134*  K 4.1 4.1  CL 105 103  CO2 23 22  GLUCOSE 101* 84  BUN 16 15  CREATININE 0.93 0.90  CALCIUM 8.9 8.5*   Liver Function Tests Recent Labs    02/19/23 0327  AST 25  ALT 42  ALKPHOS 51  BILITOT 1.0  PROT 6.1*  ALBUMIN 2.9*   No results for input(s): "LIPASE", "AMYLASE" in the last 72 hours. Cardiac Enzymes No results for input(s): "CKTOTAL", "CKMB", "CKMBINDEX", "TROPONINI" in the last 72 hours.  BNP: BNP (last 3 results) Recent Labs    02/10/23 1120  BNP >4,500.0*    ProBNP (last 3 results) Recent Labs    10/12/22 1030 10/19/22 0852 01/08/23 0957  PROBNP 2,878.0* 2,297.0* >4978.0*   D-Dimer No results for input(s): "DDIMER" in the last 72 hours. Hemoglobin A1C Recent Labs    02/19/23 0327  HGBA1C 6.0*   Fasting Lipid Panel No results for input(s): "CHOL", "HDL", "LDLCALC", "TRIG", "CHOLHDL", "LDLDIRECT" in the last 72 hours. Thyroid Function Tests No results for input(s): "TSH", "T4TOTAL", "T3FREE", "THYROIDAB" in the last  72 hours.  Invalid input(s): "FREET3"  Other results:  Imaging  No results found.   Medications:   Scheduled Medications:  aspirin  81 mg Oral Daily   Chlorhexidine Gluconate Cloth  6 each Topical Once   And   Chlorhexidine Gluconate Cloth  6 each Topical Once   diazepam  2 mg Oral Once   digoxin  0.125 mg Oral Daily   furosemide  20 mg Oral Daily   metoprolol succinate  12.5 mg Oral Daily   nirmatrelvir/ritonavir  3 tablet Oral BID   sodium chloride flush  3 mL Intravenous Q12H   spironolactone  25 mg Oral Daily    Infusions:  heparin 1,250 Units/hr (02/19/23 0407)    PRN Medications: acetaminophen, ondansetron (ZOFRAN) IV, sodium chloride flush  Patient Profile  [name] is a 77 y.o. history  of HTN, MR, HFrEF, and severe 3V CAD.    Transferred to [facility] for CT surgery consultation for possible CABG.  Assessment/Plan  1. CAD: Patient admitted with NSTEMI (?culprit D2) but with severe extensive diffuse 3 vessel disease.  Disease too extensive to treat percutaneously.  Anatomy would be best served by CABG, but CABG would be moderate-high risk given cardiomyopathy.  Echo 12/4 showed mild-moderate RV dysfunction and PAPi was adequate on RHC, so do not think RV function will be a limitation.  Patient seems to be reasonably active at baseline without significant comorbidities. - We could obtain a viability study, but LV walls are not severely thinned and presence/absence of viability has not been shown to have a significant impact on outcome for CABG in heart failure - TCTS consulted for high risk CABG, timing TBD. Being treated for COVID.   - he is CP free  - Continue heparin gtt for now.  - Continue ASA 81 - Holding statin w/ mildly elevated LFTs while being treated w/ Plaxlovid (can increase statin concentrations/increase risk for myopathy)  2. Acute on chronic systolic CHF: Patient has had HF symptoms for at least a couple of months.  Suspect pre-existing ischemic cardiomyopathy then he presented this admission with NSTEMI possible in D2 territory leading to CHF exacerbation. He has been diuresed, RHC 12/3 showed normal filling pressures and low CI at 2.0. Echo 12/4: EF 20%, RV mild-moderately dysfunctional, mild-moderate MR. Suspect patient's best chance to improve LV function will be CABG.   - Wt trending up last several days. I/Os net +. Will given extra dose of 20 mg IV Lasix today (40 mg total) and follow response  - Continue digoxin 0.125 daily.  - Continue spironolactone 25 daily.  - Hold on adding SGLT2i until post CABG   3. Delirium: Suspect some underlying mild demential.  Patient has had on and off confusion in hospital.  He would be at risk for worsening post-op delirium in  setting of CABG.   4. Gallbladder disease: Patient had gallstones but HIDA scan not suggestive of cholecystitis.  5. COVID+ - on Paxlovid   Social: Lives alone. Worried about getting home to pay bills. Worried his power may be off by the time he is discharged if payment is not made on time. Will ask SW/CM to reach out to power company to see about getting extension. They will also contact his son who lives in [place]   Length of Stay: 5  Robbie Lis, [place]  02/19/2023, 10:06 AM  Advanced Heart Failure Team Pager [phone] (M-F; 7a - 5p)  Please contact [facility] for night-coverage after hours (5p -7a ) and weekends  on [email or web address]  Patient seen and examined with the above-signed Advanced Practice Provider and/or Housestaff. I personally reviewed laboratory data, imaging studies and relevant notes. I independently examined the patient and formulated the important aspects of the plan. I have edited the note to reflect any of my changes or salient points. I have personally discussed the plan with the patient and/or family.  Tested positive for COVID. Now on Paxlovid. Denies CP.  + cough. Vitals stable.   On heparin. No bleeding  General:  Thin weak appearing. No resp difficulty + cough HEENT: normal Neck: supple. no JVD. Carotids 2+ bilat; no bruits. No lymphadenopathy or thryomegaly appreciated. Cor: PMI nondisplaced. Regular rate & rhythm. No rubs, gallops or murmurs. Lungs: coarse Abdomen: soft, nontender, nondistended. No hepatosplenomegaly. No bruits or masses. Good bowel sounds. Extremities: no cyanosis, clubbing, rash, edema Neuro: alert & orientedx3, cranial nerves grossly intact. moves all 4 extremities w/o difficulty. Affect pleasant  Stable from HF perspective. No current angina. Now COVID +. D/w TCTS.   Plan for CABG 12/19. Will need to determine if patient will remain in house until CABG or go home. Given comorbidities and social situation may need to get him in the  hospital.   Arvilla Meres, MD  5:39 PM

## 2023-02-19 NOTE — Progress Notes (Signed)
   02/19/23 1927  What Happened  Was fall witnessed? Yes  Who witnessed fall? Kizzie Bane, NT  Patients activity before fall ambulating-unassisted  Point of contact buttocks  Was patient injured? No  Provider Notification  Provider Name/Title Dr. Gwendalyn Ege  Date Provider Notified 02/19/23  Time Provider Notified 1930  Method of Notification Call  Notification Reason Fall  Provider response See new orders  Date of Provider Response 02/19/23  Time of Provider Response 1932  Follow Up  Family notified No - patient refusal  Additional tests No  Progress note created (see row info) Yes  Vitals  Temp 97.8 F (36.6 C)  Temp Source Oral  BP 108/77  MAP (mmHg) 85  BP Location Right Arm  BP Method Automatic  Patient Position (if appropriate) Lying  Pulse Rate 86  Pulse Rate Source Monitor  ECG Heart Rate 88  Resp 20  Oxygen Therapy  SpO2 98 %  O2 Device Room Air  Pain Assessment  Pain Scale 0-10  Pain Score 0  PCA/Epidural/Spinal Assessment  Respiratory Pattern Regular;Unlabored  Neurological  Neuro (WDL) X  Level of Consciousness Alert  Orientation Level Oriented to person;Oriented to time;Oriented to situation  Cognition Follows commands;Impulsive  Speech Clear  R Pupil Size (mm) 3  R Pupil Shape Round  R Pupil Reaction Brisk  L Pupil Size (mm) 3  L Pupil Shape Round  L Pupil Reaction Brisk  Motor Function/Sensation Assessment Grip  R Hand Grip Strong  L Hand Grip Strong  R Foot Dorsiflexion Strong  L Foot Dorsiflexion Strong  R Foot Plantar Flexion Strong  L Foot Plantar Flexion Strong  RUE Motor Response Purposeful movement  RUE Sensation Full sensation  RUE Motor Strength 5  LUE Motor Response Purposeful movement  LUE Sensation Full sensation  LUE Motor Strength 5  RLE Motor Response Purposeful movement  RLE Sensation Full sensation  RLE Motor Strength 5  LLE Motor Response Purposeful movement  LLE Sensation Full sensation  LLE Motor Strength 5   Neuro Symptoms Forgetful  Glasgow Coma Scale  Eye Opening 4  Best Verbal Response (NON-intubated) 4  Best Motor Response 6  Glasgow Coma Scale Score 14  Musculoskeletal  Musculoskeletal (WDL) X  Assistive Device None  Generalized Weakness Yes  Weight Bearing Restrictions No  Integumentary  Integumentary (WDL) X  Skin Color Appropriate for ethnicity  Skin Condition Dry;Flaky  Skin Integrity Abrasion  Abrasion Location Arm  Abrasion Location Orientation Bilateral  Abrasion Intervention Other (Comment)  Scratch Marks Location Back  Scratch Marks Location Orientation Bilateral  Scratch Marks Intervention Other (Comment)  Skin Turgor Non-tenting

## 2023-02-19 NOTE — TOC Progression Note (Signed)
Transition of Care [facility]) - Progression Note    Patient Details  Name: Walter Vargas MRN: [id number] Date of Birth: Feb 12, 1946  Transition of Care [facility]) CM/SW Contact  Nicanor Bake Phone Number: [phone] 02/19/2023, 1:57 PM  Clinical Narrative:   HF CSW spoke with pts son, Remi Deter over the phone in reference to pts bills. Pt seems to be concerned about his bill getting behind while he is in the hospital. Pts son stated that his father does not need to worry about his bills and he will help him manage them. Pts son stated that he lives in [place], and travels to and from often to assist his father since the hospitalization. Pts son will be here at the end of the week and will support in managing bills. Pts son also inquired about SNF and if that was an option. CSW encouraged the pts son to wait for PT/OT recommendations and doctor. Son agrees.   TOC will continue following.          Expected Discharge Plan and Services                                               Social Determinants of Health (SDOH) Interventions SDOH Screenings   Food Insecurity: No Food Insecurity (02/14/2023)  Housing: Low Risk  (02/14/2023)  Transportation Needs: No Transportation Needs (02/14/2023)  Utilities: Not At Risk (02/14/2023)  Depression (PHQ2-9): Low Risk  (10/19/2022)  Tobacco Use: Medium Risk (02/14/2023)    Readmission Risk Interventions     No data to display

## 2023-02-19 NOTE — Progress Notes (Addendum)
Mechanical fall: Patient's nurse reported that patient accidentally has a fall on the ground when he was trying to get out of the bed using the urinal.  At the baseline patient has some delirium.  Patient did not hit his head or did not injure himself.  Denies any pain anywhere.  Patient is alert oriented x 3.  Hemo-dynamically stable. -Recommending nurse to continue fall precaution, keep the bed alarm on. - Continue delirium precaution. - Place soft floor mat on both side bed to make sure that patient does not have any major injury from fall again.   Tereasa Coop, MD [facility] 02/19/2023, 7:36 PM

## 2023-02-19 NOTE — Progress Notes (Signed)
PHARMACY - ANTICOAGULATION CONSULT NOTE  Pharmacy Consult for Heparin Indication: NSTEMI, multi-vessel CAD  No Active Allergies   Patient Measurements: Height: 68"  Weight: 55.7 kg (initial weight 56.5 kg, repeated due to weight 63.7 kg at [facility]) - lasix 60 IV q12h x 4 doses given at [facility] 12/1>>12/3 IBW/kg: 68.4 kg Heparin Dosing Weight: 55.7 kg   Vital Signs: Temp: 97.6 F (36.4 C) (12/09 0821) Temp Source: Oral (12/09 0821) BP: 110/74 (12/09 0821) Pulse Rate: 75 (12/09 0821)  Labs: Recent Labs    02/17/23 0259 02/18/23 0406 02/19/23 0327  HGB 11.9* 11.9* 11.1*  HCT 37.4* 37.3* 34.5*  PLT 220 214 230  APTT  --   --  104*  LABPROT  --   --  15.8*  INR  --   --  1.2  HEPARINUNFRC 0.48 0.49 0.35  CREATININE 0.72 0.93 0.90    Estimated Creatinine Clearance: 56.8 mL/min (by C-G formula based on SCr of 0.9 mg/dL).   Medical History: Past Medical History:  Diagnosis Date   Essential hypertension    HFrEF (heart failure with reduced ejection fraction) (HCC)    Mitral regurgitation     Assessment: Patient is a 76 y.o. male who presented to the ED on 02/10/2023 with SOB and chest pain. PMH includes HFrEF with EF < 20 RV mod reduced and mildly enlarged, mod pulm artery pressure, LA mod dilated, RA mildly dilated, mod pleural effusion, severe MVR. On admission BNP > 4,500 and troponins 13,606. 12/3 taken for Right/Left heart cath and coronary angiography. Showed severe three-vessel disease. Post procedure recommends CABG +/- MVR. Pharmacy consulted to dose and manage IV heparin.   Heparin level is therapeutic at 0.35, CBC stable.  Goal of Therapy:  Heparin level 0.3-0.7 units/ml Monitor platelets by anticoagulation protocol: Yes   Plan:  Continue heparin infusion at 1250 units/hr Daily heparin level and CBC.  Fredonia Highland, PharmD, BCPS, [facility] Clinical Pharmacist [phone] Please check AMION for all [facility] Pharmacy numbers 02/19/2023

## 2023-02-20 DIAGNOSIS — I5021 Acute systolic (congestive) heart failure: Secondary | ICD-10-CM | POA: Diagnosis not present

## 2023-02-20 LAB — BASIC METABOLIC PANEL
Anion gap: 8 (ref 5–15)
BUN: 16 mg/dL (ref 8–23)
CO2: 25 mmol/L (ref 22–32)
Calcium: 9.2 mg/dL (ref 8.9–10.3)
Chloride: 101 mmol/L (ref 98–111)
Creatinine, Ser: 0.79 mg/dL (ref 0.61–1.24)
GFR, Estimated: >60 mL/min (ref >60)
Glucose, Bld: 93 mg/dL (ref 70–99)
Potassium: 4.1 mmol/L (ref 3.5–5.1)
Sodium: 134 mmol/L — ABNORMAL LOW (ref 135–145)

## 2023-02-20 LAB — DIGOXIN LEVEL: Digoxin Level: 0.7 ng/mL — ABNORMAL LOW (ref 0.8–2.0)

## 2023-02-20 LAB — HEPARIN LEVEL (UNFRACTIONATED): Heparin Unfractionated: 0.29 [IU]/mL — ABNORMAL LOW (ref 0.30–0.70)

## 2023-02-20 NOTE — Progress Notes (Signed)
PHARMACY - ANTICOAGULATION CONSULT NOTE  Pharmacy Consult for Heparin Indication: NSTEMI, multi-vessel CAD  No Active Allergies   Patient Measurements: Height: 68"  Weight: 55.7 kg (initial weight 56.5 kg, repeated due to weight 63.7 kg at [facility]) - lasix 60 IV q12h x 4 doses given at [facility] 12/1>>12/3 IBW/kg: 68.4 kg Heparin Dosing Weight: 55.7 kg   Vital Signs: Temp: 98.3 F (36.8 C) (12/10 0836) Temp Source: Oral (12/10 0836) BP: 109/67 (12/10 0836) Pulse Rate: 76 (12/10 0836)  Labs: Recent Labs    02/18/23 0406 02/19/23 0327 02/20/23 0354  HGB 11.9* 11.1*  --   HCT 37.3* 34.5*  --   PLT 214 230  --   APTT  --  104*  --   LABPROT  --  15.8*  --   INR  --  1.2  --   HEPARINUNFRC 0.49 0.35 0.29*  CREATININE 0.93 0.90 0.79    Estimated Creatinine Clearance: 63.1 mL/min (by C-G formula based on SCr of 0.79 mg/dL).   Medical History: Past Medical History:  Diagnosis Date   Essential hypertension    HFrEF (heart failure with reduced ejection fraction) (HCC)    Mitral regurgitation     Assessment: Patient is a 77 y.o. male who presented to the ED on 02/10/2023 with SOB and chest pain. PMH includes HFrEF with EF < 20 RV mod reduced and mildly enlarged, mod pulm artery pressure, LA mod dilated, RA mildly dilated, mod pleural effusion, severe MVR. On admission BNP > 4,500 and troponins 13,606. 12/3 taken for Right/Left heart cath and coronary angiography. Showed severe three-vessel disease. Post procedure recommends CABG +/- MVR. Pharmacy consulted to dose and manage IV heparin.   Heparin level is slightly subtherapeutic at 0.29.  Goal of Therapy:  Heparin level 0.3-0.7 units/ml Monitor platelets by anticoagulation protocol: Yes   Plan:  Increase heparin infusion to 1350 units/hr Daily heparin level and CBC.  Fredonia Highland, PharmD, BCPS, [facility] Clinical Pharmacist [phone] Please check AMION for all [facility] Pharmacy numbers 02/20/2023

## 2023-02-20 NOTE — Progress Notes (Signed)
   I personally called his son Remi Deter [phone]    Discussed current condition and need dopplers for preop CABG. CABG date has been set 03/01/23. I reached out to CT surgery about possible discharge.    Remi Deter plans to reach out to family members about possible discharge before CABG.    If he discharges will need to let CT Surgery know, Darius Bump RN, and set up preoperative hospital visit for labs and imaging.  Our team will reach out to [facility] tomorrow about the plan   Wlliam Grosso NP-C  5:01 PM

## 2023-02-20 NOTE — Progress Notes (Addendum)
Advanced Heart Failure Rounding Note  PCP-Cardiologist: None   Subjective:   Scheduled for CABG but canceled due to being COVID +. Now on Plaxlovid.   Yesterday diuresed with IV lasix.   Had fall last night trying to stand and use the urinal. No injuries.   Wants to go home and take care of a few things. Nonproductive cough.   Objective:   Weight Range: 57.7 kg Body mass index is 19.36 kg/m.   Vital Signs:   Temp:  [97.8 F (36.6 C)-98.5 F (36.9 C)] 98.3 F (36.8 C) (12/10 0836) Pulse Rate:  [62-86] 76 (12/10 0836) Resp:  [16-20] 16 (12/10 0836) BP: (98-114)/(61-77) 109/67 (12/10 0836) SpO2:  [97 %-100 %] 100 % (12/10 0836) Weight:  [57.7 kg] 57.7 kg (12/10 0400) Last BM Date : 02/16/23  Weight change: Filed Weights   02/18/23 0406 02/19/23 0350 02/20/23 0400  Weight: 57 kg 58.4 kg 57.7 kg    Intake/Output:   Intake/Output Summary (Last 24 hours) at 02/20/2023 0903 Last data filed at 02/20/2023 0835 Gross per 24 hour  Intake 1450.36 ml  Output 300 ml  Net 1150.36 ml    Physical Exam  General:  Thin. Sitting on the side of the bed.  No resp difficulty HEENT: normal Neck: supple. no JVD. Carotids 2+ bilat; no bruits. No lymphadenopathy or thryomegaly appreciated. Cor: PMI nondisplaced. Regular rate & rhythm. No rubs, gallops or murmurs. Lungs: clear on room air.  Abdomen: soft, nontender, nondistended. No hepatosplenomegaly. No bruits or masses. Good bowel sounds. Extremities: no cyanosis, clubbing, rash, edema Neuro: alert & orientedx3, cranial nerves grossly intact. moves all 4 extremities w/o difficulty. Affect pleasant   Telemetry   SR 60-80s   EKG    No new EKG to review  Labs    CBC Recent Labs    02/18/23 0406 02/19/23 0327  WBC 5.8 8.9  NEUTROABS  --  7.0  HGB 11.9* 11.1*  HCT 37.3* 34.5*  MCV 84.4 82.9  PLT 214 230   Basic Metabolic Panel Recent Labs    16/10/96 0327 02/20/23 0354  NA 134* 134*  K 4.1 4.1  CL 103 101   CO2 22 25  GLUCOSE 84 93  BUN 15 16  CREATININE 0.90 0.79  CALCIUM 8.5* 9.2   Liver Function Tests Recent Labs    02/19/23 0327  AST 25  ALT 42  ALKPHOS 51  BILITOT 1.0  PROT 6.1*  ALBUMIN 2.9*   No results for input(s): "LIPASE", "AMYLASE" in the last 72 hours. Cardiac Enzymes No results for input(s): "CKTOTAL", "CKMB", "CKMBINDEX", "TROPONINI" in the last 72 hours.  BNP: BNP (last 3 results) Recent Labs    02/10/23 1120  BNP >4,500.0*    ProBNP (last 3 results) Recent Labs    10/12/22 1030 10/19/22 0852 01/08/23 0957  PROBNP 2,878.0* 2,297.0* >4978.0*   D-Dimer No results for input(s): "DDIMER" in the last 72 hours. Hemoglobin A1C Recent Labs    02/19/23 0327  HGBA1C 6.0*   Fasting Lipid Panel No results for input(s): "CHOL", "HDL", "LDLCALC", "TRIG", "CHOLHDL", "LDLDIRECT" in the last 72 hours. Thyroid Function Tests No results for input(s): "TSH", "T4TOTAL", "T3FREE", "THYROIDAB" in the last 72 hours.  Invalid input(s): "FREET3"  Other results:  Imaging  No results found.   Medications:   Scheduled Medications:  aspirin  81 mg Oral Daily   Chlorhexidine Gluconate Cloth  6 each Topical Once   And   Chlorhexidine Gluconate Cloth  6 each Topical  Once   digoxin  0.125 mg Oral Daily   furosemide  20 mg Oral Daily   metoprolol succinate  12.5 mg Oral Daily   nirmatrelvir/ritonavir  3 tablet Oral BID   sodium chloride flush  3 mL Intravenous Q12H   spironolactone  25 mg Oral Daily    Infusions:  heparin 1,250 Units/hr (02/20/23 0332)    PRN Medications: acetaminophen, ondansetron (ZOFRAN) IV, sodium chloride flush  Patient Profile  [name] is a 77 y.o. history of HTN, MR, HFrEF, and severe 3V CAD.    Transferred to [facility] for CT surgery consultation for possible CABG.  Assessment/Plan  1. CAD: Patient admitted with NSTEMI (?culprit D2) but with severe extensive diffuse 3 vessel disease.  Disease too extensive to treat percutaneously.   Anatomy would be best served by CABG, but CABG would be moderate-high risk given cardiomyopathy.  Echo 12/4 showed mild-moderate RV dysfunction and PAPi was adequate on RHC, so do not think RV function will be a limitation.  Patient seems to be reasonably active at baseline without significant comorbidities. - We could obtain a viability study, but LV walls are not severely thinned and presence/absence of viability has not been shown to have a significant impact on outcome for CABG in heart failure - TCTS consulted for high risk CABG, timing TBD. Being treated for COVID.  CABG rescheduled for 12/19 - No chest pain.  - Continue heparin gtt for now.  - Continue ASA 81 - Holding statin w/ mildly elevated LFTs while being treated w/ Plaxlovid (can increase statin concentrations/increase risk for myopathy)  2. Acute on chronic systolic CHF: Patient has had HF symptoms for at least a couple of months.  Suspect pre-existing ischemic cardiomyopathy then he presented this admission with NSTEMI possible in D2 territory leading to CHF exacerbation. He has been diuresed, RHC 12/3 showed normal filling pressures and low CI at 2.0. Echo 12/4: EF 20%, RV mild-moderately dysfunctional, mild-moderate MR. Suspect patient's best chance to improve LV function will be CABG.   - Appears  euvolemic.  - Continue digoxin 0.125 daily.  - Continue spironolactone 25 daily.  - Hold on adding SGLT2i until post CABG  - Renal function stable  3. Delirium: Suspect some underlying mild demential.  Patient has had on and off confusion in hospital.  He would be at risk for worsening post-op delirium in setting of CABG.    4. Gallbladder disease: Patient had gallstones but HIDA scan not suggestive of cholecystitis.  5. COVID+ - on Paxlovid   6. Fall Had fall last night. No injuries. Did not hit his head. Soft mats beside bed.     Social: SW met with him and discussed with his family. Family plans to help him.    Length of  Stay: 6  Amy Clegg, NP  02/20/2023, 9:03 AM  Advanced Heart Failure Team Pager [phone] (M-F; 7a - 5p)  Please contact [facility] for night-coverage after hours (5p -7a ) and weekends on [email or web address]   Patient seen and examined with the above-signed Advanced Practice Provider and/or Housestaff. I personally reviewed laboratory data, imaging studies and relevant notes. I independently examined the patient and formulated the important aspects of the plan. I have edited the note to reflect any of my changes or salient points. I have personally discussed the plan with the patient and/or family.  On Paxlovid. Still coughing. Felt weak last night and had a fall. No CP or SOB. Wants to go home  General:  Weak appearing. +  fall HEENT: normal Neck: supple. no JVD. Carotids 2+ bilat; no bruits. No lymphadenopathy or thryomegaly appreciated. Cor: PMI nondisplaced. Regular rate & rhythm. No rubs, gallops or murmurs. Lungs: coarse Abdomen: soft, nontender, nondistended. No hepatosplenomegaly. No bruits or masses. Good bowel sounds. Extremities: no cyanosis, clubbing, rash, edema Neuro: alert & orientedx3, cranial nerves grossly intact. moves all 4 extremities w/o difficulty. Affect pleasant  CABG cancelled due to COVID rescheduled for 12/19. I told him that we would consider d/c home for several days prior to CABG if he can assure me that he has someone to live with him for 1 week.   Arvilla Meres, MD  1:02 PM

## 2023-02-20 NOTE — Progress Notes (Signed)
Physical Therapy Treatment Patient Details Name: Walter Vargas MRN: [id number] DOB: [birth year] Today's Date: 02/20/2023   History of Present Illness 77 y.o. male admitted 02/13/22 with NSTEMI, CHF exacerbation. LHC with 3-vessel CAD. Plan for CABG with possible MVR but pt covid + so surgery delayed until possibly 12/19. Pt with fall in room on 12/9. PMH includes HTN, HF, mod/severe MR.    PT Comments  Pt now with delay in CABG due to now covid +. Pt also with fall yesterday in room. Had pt use rollator today for incr stability. Pt with poor safety awareness in general. Agree with Dr Teressa Lower that if he is to go home prior to surgery he will need someone to live with him for safety. Will continue to follow.     If plan is discharge home, recommend the following: Assistance with cooking/housework;Direct supervision/assist for medications management;Direct supervision/assist for financial management;Assist for transportation;Supervision due to cognitive status   Can travel by private Psychologist, clinical (4 wheels)    Recommendations for Other Services       Precautions / Restrictions Precautions Precautions: Fall Restrictions Weight Bearing Restrictions: No     Mobility  Bed Mobility               General bed mobility comments: Pt sitting EOB    Transfers Overall transfer level: Needs assistance Equipment used: Rollator (4 wheels) Transfers: Sit to/from Stand Sit to Stand: Contact guard assist           General transfer comment: Assist for safety    Ambulation/Gait Ambulation/Gait assistance: Supervision Gait Distance (Feet): 225 Feet Assistive device: Rollator (4 wheels) Gait Pattern/deviations: Step-through pattern, Decreased stride length Gait velocity: normal Gait velocity interpretation: >4.37 ft/sec, indicative of normal walking speed   General Gait Details: supervision for safety and lines   Stairs              Wheelchair Mobility     Tilt Bed    Modified Rankin (Stroke Patients Only)       Balance Overall balance assessment: Needs assistance Sitting-balance support: No upper extremity supported, Feet supported Sitting balance-Leahy Scale: Good     Standing balance support: No upper extremity supported, During functional activity Standing balance-Leahy Scale: Fair                              Cognition Arousal: Alert Behavior During Therapy: Restless Overall Cognitive Status: History of cognitive impairments - at baseline                                 General Comments: Pt easily distracted and decr safety awareness which family reports is baseline        Exercises      General Comments        Pertinent Vitals/Pain      Home Living                          Prior Function            PT Goals (current goals can now be found in the care plan section) Acute Rehab PT Goals Patient Stated Goal: return home Progress towards PT goals: Progressing toward goals    Frequency    Min 1X/week      PT Plan  Co-evaluation              AM-PAC PT "6 Clicks" Mobility   Outcome Measure  Help needed turning from your back to your side while in a flat bed without using bedrails?: None Help needed moving from lying on your back to sitting on the side of a flat bed without using bedrails?: None Help needed moving to and from a bed to a chair (including a wheelchair)?: A Little Help needed standing up from a chair using your arms (e.g., wheelchair or bedside chair)?: A Little Help needed to walk in hospital room?: A Little Help needed climbing 3-5 steps with a railing? : A Little 6 Click Score: 20    End of Session   Activity Tolerance: Patient tolerated treatment well Patient left: with call bell/phone within reach;in chair;with chair alarm set Nurse Communication: Mobility status PT Visit Diagnosis: Other  abnormalities of gait and mobility (R26.89)     Time: 1610-9604 PT Time Calculation (min) (ACUTE ONLY): 25 min  Charges:    $Gait Training: 23-37 mins PT General Charges $$ ACUTE PT VISIT: 1 Visit                     [facility] PT Acute Rehabilitation Services Office [phone]    Angelina Ok [facility] 02/20/2023, 12:01 PM

## 2023-02-20 NOTE — Plan of Care (Signed)
Problem: Education: Goal: Knowledge of General Education information will improve Description: Including pain rating scale, medication(s)/side effects and non-pharmacologic comfort measures Outcome: Progressing   Problem: Health Behavior/Discharge Planning: Goal: Ability to manage health-related needs will improve Outcome: Progressing   Problem: Clinical Measurements: Goal: Ability to maintain clinical measurements within normal limits will improve Outcome: Progressing Goal: Will remain free from infection Outcome: Progressing Goal: Diagnostic test results will improve Outcome: Progressing Goal: Respiratory complications will improve Outcome: Progressing Goal: Cardiovascular complication will be avoided Outcome: Progressing   Problem: Activity: Goal: Risk for activity intolerance will decrease Outcome: Progressing   Problem: Nutrition: Goal: Adequate nutrition will be maintained Outcome: Progressing   Problem: Coping: Goal: Level of anxiety will decrease Outcome: Progressing   Problem: Elimination: Goal: Will not experience complications related to bowel motility Outcome: Progressing Goal: Will not experience complications related to urinary retention Outcome: Progressing   Problem: Pain Management: Goal: General experience of comfort will improve Outcome: Progressing   Problem: Safety: Goal: Ability to remain free from injury will improve Outcome: Progressing   Problem: Skin Integrity: Goal: Risk for impaired skin integrity will decrease Outcome: Progressing   Problem: Education: Goal: Knowledge of General Education information will improve Description: Including pain rating scale, medication(s)/side effects and non-pharmacologic comfort measures Outcome: Progressing   Problem: Health Behavior/Discharge Planning: Goal: Ability to manage health-related needs will improve Outcome: Progressing   Problem: Clinical Measurements: Goal: Ability to maintain  clinical measurements within normal limits will improve Outcome: Progressing Goal: Will remain free from infection Outcome: Progressing Goal: Diagnostic test results will improve Outcome: Progressing Goal: Respiratory complications will improve Outcome: Progressing Goal: Cardiovascular complication will be avoided Outcome: Progressing   Problem: Activity: Goal: Risk for activity intolerance will decrease Outcome: Progressing   Problem: Nutrition: Goal: Adequate nutrition will be maintained Outcome: Progressing   Problem: Coping: Goal: Level of anxiety will decrease Outcome: Progressing   Problem: Elimination: Goal: Will not experience complications related to bowel motility Outcome: Progressing Goal: Will not experience complications related to urinary retention Outcome: Progressing   Problem: Pain Management: Goal: General experience of comfort will improve Outcome: Progressing   Problem: Safety: Goal: Ability to remain free from injury will improve Outcome: Progressing   Problem: Skin Integrity: Goal: Risk for impaired skin integrity will decrease Outcome: Progressing   Problem: Education: Goal: Knowledge of disease or condition will improve Outcome: Progressing Goal: Understanding of medication regimen will improve Outcome: Progressing Goal: Individualized Educational Video(s) Outcome: Progressing   Problem: Activity: Goal: Ability to tolerate increased activity will improve Outcome: Progressing   Problem: Cardiac: Goal: Ability to achieve and maintain adequate cardiopulmonary perfusion will improve Outcome: Progressing   Problem: Health Behavior/Discharge Planning: Goal: Ability to safely manage health-related needs after discharge will improve Outcome: Progressing   Problem: Education: Goal: Will demonstrate proper wound care and an understanding of methods to prevent future damage Outcome: Progressing Goal: Knowledge of disease or condition will  improve Outcome: Progressing Goal: Knowledge of the prescribed therapeutic regimen will improve Outcome: Progressing Goal: Individualized Educational Video(s) Outcome: Progressing   Problem: Activity: Goal: Risk for activity intolerance will decrease Outcome: Progressing   Problem: Cardiac: Goal: Will achieve and/or maintain hemodynamic stability Outcome: Progressing   Problem: Clinical Measurements: Goal: Postoperative complications will be avoided or minimized Outcome: Progressing   Problem: Respiratory: Goal: Respiratory status will improve Outcome: Progressing   Problem: Skin Integrity: Goal: Wound healing without signs and symptoms of infection Outcome: Progressing Goal: Risk for impaired skin integrity will decrease Outcome: Progressing

## 2023-02-21 ENCOUNTER — Other Ambulatory Visit (HOSPITAL_COMMUNITY): Payer: 59

## 2023-02-21 ENCOUNTER — Other Ambulatory Visit: Payer: Self-pay | Admitting: *Deleted

## 2023-02-21 ENCOUNTER — Encounter: Payer: Self-pay | Admitting: *Deleted

## 2023-02-21 DIAGNOSIS — I2510 Atherosclerotic heart disease of native coronary artery without angina pectoris: Secondary | ICD-10-CM

## 2023-02-21 DIAGNOSIS — I214 Non-ST elevation (NSTEMI) myocardial infarction: Secondary | ICD-10-CM | POA: Diagnosis not present

## 2023-02-21 LAB — BASIC METABOLIC PANEL
Anion gap: 5 (ref 5–15)
BUN: 13 mg/dL (ref 8–23)
CO2: 25 mmol/L (ref 22–32)
Calcium: 8.8 mg/dL — ABNORMAL LOW (ref 8.9–10.3)
Chloride: 104 mmol/L (ref 98–111)
Creatinine, Ser: 0.86 mg/dL (ref 0.61–1.24)
GFR, Estimated: >60 mL/min (ref >60)
Glucose, Bld: 79 mg/dL (ref 70–99)
Potassium: 4.2 mmol/L (ref 3.5–5.1)
Sodium: 134 mmol/L — ABNORMAL LOW (ref 135–145)

## 2023-02-21 LAB — HEPARIN LEVEL (UNFRACTIONATED): Heparin Unfractionated: 0.42 [IU]/mL (ref 0.30–0.70)

## 2023-02-21 NOTE — Care Management (Signed)
Ordered rollator through [employer] to be delivered to the room today

## 2023-02-21 NOTE — Progress Notes (Addendum)
Advanced Heart Failure Rounding Note  PCP-Cardiologist: None   Subjective:    Scheduled for CABG but postponed due to being COVID +. Now on Plaxlovid.   Son is coming from [facility] to help patient at home prior to CABG.    Objective:   Weight Range: 49.3 kg Body mass index is 16.51 kg/m.   Vital Signs:   Temp:  [97.6 F (36.4 C)-98.4 F (36.9 C)] 97.6 F (36.4 C) (12/11 0308) Pulse Rate:  [65-73] 70 (12/11 0308) Resp:  [16-18] 18 (12/11 0308) BP: (107-114)/(62-88) 111/78 (12/11 0308) SpO2:  [98 %-100 %] 98 % (12/11 0308) Weight:  [49.3 kg] 49.3 kg (12/11 0308) Last BM Date : 02/16/23  Weight change: Filed Weights   02/19/23 0350 02/20/23 0400 02/21/23 0308  Weight: 58.4 kg 57.7 kg 49.3 kg    Intake/Output:   Intake/Output Summary (Last 24 hours) at 02/21/2023 1154 Last data filed at 02/21/2023 1032 Gross per 24 hour  Intake 802.2 ml  Output 800 ml  Net 2.2 ml    Physical Exam   General: Well appearing. No distress on RA HEENT: neck supple.   Cardiac: no JVD. S1 and S2 present. No murmurs or rub. Resp: Lung sounds clear and equal B/L Abdomen: Soft, non-tender, non-distended. + BS. Extremities: Warm and dry. No rash, cyanosis, edema.  Neuro: Alert and oriented x3. Affect pleasant. Moves all extremities without difficulty.  Telemetry   SR 60-70s (personally reviewed)  EKG    No new EKG to review  Labs    CBC Recent Labs    02/19/23 0327  WBC 8.9  NEUTROABS 7.0  HGB 11.1*  HCT 34.5*  MCV 82.9  PLT 230   Basic Metabolic Panel Recent Labs    95/28/41 0354 02/21/23 0424  NA 134* 134*  K 4.1 4.2  CL 101 104  CO2 25 25  GLUCOSE 93 79  BUN 16 13  CREATININE 0.79 0.86  CALCIUM 9.2 8.8*   Liver Function Tests Recent Labs    02/19/23 0327  AST 25  ALT 42  ALKPHOS 51  BILITOT 1.0  PROT 6.1*  ALBUMIN 2.9*   No results for input(s): "LIPASE", "AMYLASE" in the last 72 hours. Cardiac Enzymes No results for input(s): "CKTOTAL", "CKMB",  "CKMBINDEX", "TROPONINI" in the last 72 hours.  BNP: BNP (last 3 results) Recent Labs    02/10/23 1120  BNP >4,500.0*   ProBNP (last 3 results) Recent Labs    10/12/22 1030 10/19/22 0852 01/08/23 0957  PROBNP 2,878.0* 2,297.0* >4978.0*   D-Dimer No results for input(s): "DDIMER" in the last 72 hours. Hemoglobin A1C Recent Labs    02/19/23 0327  HGBA1C 6.0*   Fasting Lipid Panel No results for input(s): "CHOL", "HDL", "LDLCALC", "TRIG", "CHOLHDL", "LDLDIRECT" in the last 72 hours. Thyroid Function Tests No results for input(s): "TSH", "T4TOTAL", "T3FREE", "THYROIDAB" in the last 72 hours.  Invalid input(s): "FREET3"  Other results:  Imaging   No results found.  Medications:   Scheduled Medications:  aspirin  81 mg Oral Daily   Chlorhexidine Gluconate Cloth  6 each Topical Once   And   Chlorhexidine Gluconate Cloth  6 each Topical Once   digoxin  0.125 mg Oral Daily   furosemide  20 mg Oral Daily   metoprolol succinate  12.5 mg Oral Daily   nirmatrelvir/ritonavir  3 tablet Oral BID   sodium chloride flush  3 mL Intravenous Q12H   spironolactone  25 mg Oral Daily    Infusions:  heparin 1,350 Units/hr (02/21/23 1031)    PRN Medications: acetaminophen, ondansetron (ZOFRAN) IV, sodium chloride flush  Patient Profile   Walter Vargas is a 77 y.o. history of HTN, Walter, HFrEF, and severe 3V CAD.  Transferred to [facility] for CT surgery consultation for possible CABG.   Assessment/Plan   1. CAD: Patient admitted with NSTEMI (?culprit D2) but with severe extensive diffuse 3 vessel disease.  Disease too extensive to treat percutaneously.  Anatomy would be best served by CABG, but CABG would be moderate-high risk given cardiomyopathy.  Echo 12/4 showed mild-moderate RV dysfunction and PAPi was adequate on RHC, so do not think RV function will be a limitation.  Patient seems to be reasonably active at baseline without significant comorbidities. - We could obtain a viability  study, but LV walls are not severely thinned and presence/absence of viability has not been shown to have a significant impact on outcome for CABG in heart failure - TCTS high risk CABG now postponed to 12/19 d/t COVID + being treated w Paxlovid - No chest pain.  - On heparin gtt. - Continue ASA 81 - Holding statin w/ mildly elevated LFTs while being treated w/ Plaxlovid (can increase statin concentrations/increase risk for myopathy)  2. Acute on chronic systolic CHF: Patient has had HF symptoms for at least a couple of months.  Suspect pre-existing ischemic cardiomyopathy then he presented this admission with NSTEMI possible in D2 territory leading to CHF exacerbation. He has been diuresed, RHC 12/3 showed normal filling pressures and low CI at 2.0. Echo 12/4: EF 20%, RV mild-moderately dysfunctional, mild-moderate Walter. Suspect patient's best chance to improve LV function will be CABG.   - Appears euvolemic. Continue Lasix 20 PO daily - Continue Digoxin 0.125 daily.  - Continue Spironolactone 25 daily.  - Continue Metoprolol 12.5 daily - Hold on adding SGLT2i until post CABG  - Renal function stable  3. Delirium: Suspect some underlying mild demential.  Patient has had on and off confusion in hospital.  He would be at risk for worsening post-op delirium in setting of CABG.    4. Gallbladder disease: Patient had gallstones but HIDA scan not suggestive of cholecystitis.  5. COVID+: on Paxlovid   6. Fall: Had inpatient fall. No injuries. Did not hit his head. Soft mats beside bed.   Social: Patient would like to discharge home and come back as outpatient for CABG. Per pt, son is on the way from [facility] to help patient at home prior to surgery. Dr. Gala Romney will reach out to him today.   Length of Stay: 7  Walter Lee, NP  02/21/2023, 11:54 AM  Advanced Heart Failure Team Pager [phone] (M-F; 7a - 5p)  Please contact [facility] for night-coverage after hours (5p -7a ) and weekends on  [email or web address]  Patient seen and examined with the above-signed Advanced Practice Provider and/or Housestaff. I personally reviewed laboratory data, imaging studies and relevant notes. I independently examined the patient and formulated the important aspects of the plan. I have edited the note to reflect any of my changes or salient points. I have personally discussed the plan with the patient and/or family.  COVID cough improving. Denies CP or SOB.   General:  Thin male. No resp difficulty HEENT: normal Neck: supple. no JVD. Carotids 2+ bilat; no bruits. No lymphadenopathy or thryomegaly appreciated. Cor: PMI nondisplaced. Regular rate & rhythm. No rubs, gallops or murmurs. Lungs: coarse Abdomen: soft, nontender, nondistended. No hepatosplenomegaly. No bruits or masses. Good bowel sounds. Extremities:  no cyanosis, clubbing, rash, edema Neuro: alert & orientedx3, cranial nerves grossly intact. moves all 4 extremities w/o difficulty. Affect pleasant  He is eager to go home prior to CABG but will need someone to stay with him.  I spoke with his son who is in [facility] and attempting to make arrangements for him.   Will go ahead and schedule pre-CABG Doppler studies prior to d/c.   Continue current regimen. Continue to ambulate.   Arvilla Meres, MD  4:45 PM

## 2023-02-21 NOTE — Plan of Care (Signed)
  Problem: Education: Goal: Knowledge of General Education information will improve Description: Including pain rating scale, medication(s)/side effects and non-pharmacologic comfort measures Outcome: Progressing   Problem: Health Behavior/Discharge Planning: Goal: Ability to manage health-related needs will improve Outcome: Progressing   Problem: Clinical Measurements: Goal: Ability to maintain clinical measurements within normal limits will improve Outcome: Progressing Goal: Will remain free from infection Outcome: Progressing Goal: Diagnostic test results will improve Outcome: Progressing Goal: Respiratory complications will improve Outcome: Progressing Goal: Cardiovascular complication will be avoided Outcome: Progressing   Problem: Activity: Goal: Risk for activity intolerance will decrease Outcome: Progressing   Problem: Coping: Goal: Level of anxiety will decrease Outcome: Progressing   Problem: Elimination: Goal: Will not experience complications related to bowel motility Outcome: Progressing Goal: Will not experience complications related to urinary retention Outcome: Progressing   Problem: Pain Management: Goal: General experience of comfort will improve Outcome: Progressing   Problem: Safety: Goal: Ability to remain free from injury will improve Outcome: Progressing   Problem: Skin Integrity: Goal: Risk for impaired skin integrity will decrease Outcome: Progressing   Problem: Education: Goal: Knowledge of General Education information will improve Description: Including pain rating scale, medication(s)/side effects and non-pharmacologic comfort measures Outcome: Progressing   Problem: Health Behavior/Discharge Planning: Goal: Ability to manage health-related needs will improve Outcome: Progressing   Problem: Clinical Measurements: Goal: Ability to maintain clinical measurements within normal limits will improve Outcome: Progressing Goal: Will  remain free from infection Outcome: Progressing Goal: Diagnostic test results will improve Outcome: Progressing Goal: Respiratory complications will improve Outcome: Progressing Goal: Cardiovascular complication will be avoided Outcome: Progressing   Problem: Activity: Goal: Risk for activity intolerance will decrease Outcome: Progressing   Problem: Nutrition: Goal: Adequate nutrition will be maintained Outcome: Progressing   Problem: Coping: Goal: Level of anxiety will decrease Outcome: Progressing   Problem: Elimination: Goal: Will not experience complications related to bowel motility Outcome: Progressing Goal: Will not experience complications related to urinary retention Outcome: Progressing   Problem: Pain Management: Goal: General experience of comfort will improve Outcome: Progressing   Problem: Safety: Goal: Ability to remain free from injury will improve Outcome: Progressing   Problem: Skin Integrity: Goal: Risk for impaired skin integrity will decrease Outcome: Progressing   Problem: Education: Goal: Knowledge of disease or condition will improve Outcome: Progressing Goal: Understanding of medication regimen will improve Outcome: Progressing Goal: Individualized Educational Video(s) Outcome: Progressing   Problem: Activity: Goal: Ability to tolerate increased activity will improve Outcome: Progressing   Problem: Cardiac: Goal: Ability to achieve and maintain adequate cardiopulmonary perfusion will improve Outcome: Progressing   Problem: Health Behavior/Discharge Planning: Goal: Ability to safely manage health-related needs after discharge will improve Outcome: Progressing   Problem: Education: Goal: Will demonstrate proper wound care and an understanding of methods to prevent future damage Outcome: Progressing Goal: Knowledge of disease or condition will improve Outcome: Progressing Goal: Knowledge of the prescribed therapeutic regimen will  improve Outcome: Progressing Goal: Individualized Educational Video(s) Outcome: Progressing   Problem: Activity: Goal: Risk for activity intolerance will decrease Outcome: Progressing   Problem: Cardiac: Goal: Will achieve and/or maintain hemodynamic stability Outcome: Progressing   Problem: Clinical Measurements: Goal: Postoperative complications will be avoided or minimized Outcome: Progressing   Problem: Respiratory: Goal: Respiratory status will improve Outcome: Progressing   Problem: Skin Integrity: Goal: Wound healing without signs and symptoms of infection Outcome: Progressing Goal: Risk for impaired skin integrity will decrease Outcome: Progressing

## 2023-02-21 NOTE — Progress Notes (Signed)
Mobility Specialist Progress Note:    02/21/23 1142  Mobility  Activity Ambulated with assistance in hallway  Level of Assistance Contact guard assist, steadying assist  Assistive Device Four wheel walker  Distance Ambulated (ft) 500 ft  Activity Response Tolerated well  Mobility Referral Yes  Mobility visit 1 Mobility  Mobility Specialist Start Time (ACUTE ONLY) 1117  Mobility Specialist Stop Time (ACUTE ONLY) 1136  Mobility Specialist Time Calculation (min) (ACUTE ONLY) 19 min   Pt received in chair, agreeable to mobility. Voided in BR before ambulating in hallway. No complaints throughout. Pt left in chair with call bell and all needs met. Chair alarm on.  D'Vante Earlene Plater Mobility Specialist Please contact via Special educational needs teacher or Rehab office at [phone]

## 2023-02-21 NOTE — Progress Notes (Signed)
PHARMACY - ANTICOAGULATION CONSULT NOTE  Pharmacy Consult for Heparin Indication: NSTEMI, multi-vessel CAD  No Active Allergies   Patient Measurements: Height: 68"  Weight: 55.7 kg (initial weight 56.5 kg, repeated due to weight 63.7 kg at [facility]) - lasix 60 IV q12h x 4 doses given at [facility] 12/1>>12/3 IBW/kg: 68.4 kg Heparin Dosing Weight: 55.7 kg   Vital Signs: Temp: 97.6 F (36.4 C) (12/11 0308) Temp Source: Oral (12/11 0308) BP: 111/78 (12/11 0308) Pulse Rate: 70 (12/11 0308)  Labs: Recent Labs    02/19/23 0327 02/20/23 0354 02/21/23 0424  HGB 11.1*  --   --   HCT 34.5*  --   --   PLT 230  --   --   APTT 104*  --   --   LABPROT 15.8*  --   --   INR 1.2  --   --   HEPARINUNFRC 0.35 0.29* 0.42  CREATININE 0.90 0.79 0.86    Estimated Creatinine Clearance: 50.2 mL/min (by C-G formula based on SCr of 0.86 mg/dL).   Medical History: Past Medical History:  Diagnosis Date   Essential hypertension    HFrEF (heart failure with reduced ejection fraction) (HCC)    Mitral regurgitation     Assessment: Patient is a 77 y.o. male who presented to the ED on 02/10/2023 with SOB and chest pain. PMH includes HFrEF with EF < 20 RV mod reduced and mildly enlarged, mod pulm artery pressure, LA mod dilated, RA mildly dilated, mod pleural effusion, severe MVR. On admission BNP > 4,500 and troponins 13,606. 12/3 taken for Right/Left heart cath and coronary angiography. Showed severe three-vessel disease. Post procedure recommends CABG +/- MVR. Pharmacy consulted to dose and manage IV heparin.   Heparin level is therapeutic at 0.42.  Goal of Therapy:  Heparin level 0.3-0.7 units/ml Monitor platelets by anticoagulation protocol: Yes   Plan:  Continue heparin 1350 units/h Daily heparin level and CBC  Fredonia Highland, PharmD, [place], [facility] Clinical Pharmacist [phone] Please check AMION for all [facility] Pharmacy numbers 02/21/2023

## 2023-02-22 ENCOUNTER — Inpatient Hospital Stay (HOSPITAL_COMMUNITY): Payer: 59

## 2023-02-22 DIAGNOSIS — I6522 Occlusion and stenosis of left carotid artery: Secondary | ICD-10-CM | POA: Diagnosis not present

## 2023-02-22 DIAGNOSIS — I340 Nonrheumatic mitral (valve) insufficiency: Secondary | ICD-10-CM | POA: Diagnosis not present

## 2023-02-22 DIAGNOSIS — I2510 Atherosclerotic heart disease of native coronary artery without angina pectoris: Secondary | ICD-10-CM | POA: Diagnosis not present

## 2023-02-22 DIAGNOSIS — Z01810 Encounter for preprocedural cardiovascular examination: Secondary | ICD-10-CM | POA: Diagnosis not present

## 2023-02-22 DIAGNOSIS — I5021 Acute systolic (congestive) heart failure: Secondary | ICD-10-CM | POA: Diagnosis not present

## 2023-02-22 LAB — BASIC METABOLIC PANEL
Anion gap: 9 (ref 5–15)
BUN: 14 mg/dL (ref 8–23)
CO2: 26 mmol/L (ref 22–32)
Calcium: 9.3 mg/dL (ref 8.9–10.3)
Chloride: 101 mmol/L (ref 98–111)
Creatinine, Ser: 0.87 mg/dL (ref 0.61–1.24)
GFR, Estimated: >60 mL/min (ref >60)
Glucose, Bld: 86 mg/dL (ref 70–99)
Potassium: 4.4 mmol/L (ref 3.5–5.1)
Sodium: 136 mmol/L (ref 135–145)

## 2023-02-22 LAB — CBC
HCT: 36.5 % — ABNORMAL LOW (ref 39.0–52.0)
Hemoglobin: 11.6 g/dL — ABNORMAL LOW (ref 13.0–17.0)
MCH: 26.6 pg (ref 26.0–34.0)
MCHC: 31.8 g/dL (ref 30.0–36.0)
MCV: 83.7 fL (ref 80.0–100.0)
Platelets: 323 10*3/uL (ref 150–400)
RBC: 4.36 MIL/uL (ref 4.22–5.81)
RDW: 20.1 % — ABNORMAL HIGH (ref 11.5–15.5)
WBC: 7.4 10*3/uL (ref 4.0–10.5)
nRBC: 0 % (ref 0.0–0.2)

## 2023-02-22 LAB — HEPARIN LEVEL (UNFRACTIONATED): Heparin Unfractionated: 0.66 [IU]/mL (ref 0.30–0.70)

## 2023-02-22 MED ORDER — POLYETHYLENE GLYCOL 3350 17 G PO PACK
17.0000 g | PACK | Freq: Every day | ORAL | Status: DC
Start: 2023-02-22 — End: 2023-03-01
  Administered 2023-02-23 – 2023-02-27 (×5): 17 g via ORAL
  Filled 2023-02-22 (×7): qty 1

## 2023-02-22 MED ORDER — SENNOSIDES-DOCUSATE SODIUM 8.6-50 MG PO TABS
2.0000 | ORAL_TABLET | Freq: Every day | ORAL | Status: DC
  Administered 2023-02-22 – 2023-02-25 (×3): 2 via ORAL
  Filled 2023-02-22 (×6): qty 2

## 2023-02-22 NOTE — Progress Notes (Signed)
Mobility Specialist Progress Note:    02/22/23 1200  Mobility  Activity Ambulated with assistance in hallway  Level of Assistance Contact guard assist, steadying assist  Assistive Device Four wheel walker  Distance Ambulated (ft) 500 ft  Activity Response Tolerated well  Mobility Referral Yes  Mobility visit 1 Mobility  Mobility Specialist Start Time (ACUTE ONLY) 1113  Mobility Specialist Stop Time (ACUTE ONLY) 1132  Mobility Specialist Time Calculation (min) (ACUTE ONLY) 19 min   Received pt in chair having no complaints and agreeable to mobility. Pt was asymptomatic throughout ambulation and returned to room w/o fault. Left in chair w/ call bell in reach and all needs met. Chair alarm on.   D'Vante Earlene Plater Mobility Specialist Please contact via Special educational needs teacher or Rehab office at [phone]

## 2023-02-22 NOTE — Progress Notes (Signed)
PHARMACY - ANTICOAGULATION CONSULT NOTE  Pharmacy Consult for Heparin Indication: NSTEMI, multi-vessel CAD  No Active Allergies   Patient Measurements: Height: 68"  Weight: 55.7 kg (initial weight 56.5 kg, repeated due to weight 63.7 kg at [facility]) - lasix 60 IV q12h x 4 doses given at [facility] 12/1>>12/3 IBW/kg: 68.4 kg Heparin Dosing Weight: 55.7 kg   Vital Signs: Temp: 97.8 F (36.6 C) (12/12 0324) Temp Source: Oral (12/12 0324) BP: 107/49 (12/12 1027) Pulse Rate: 68 (12/12 1027)  Labs: Recent Labs    02/20/23 0354 02/21/23 0424 02/22/23 0346  HGB  --   --  11.6*  HCT  --   --  36.5*  PLT  --   --  323  HEPARINUNFRC 0.29* 0.42 0.66  CREATININE 0.79 0.86 0.87    Estimated Creatinine Clearance: 48.8 mL/min (by C-G formula based on SCr of 0.87 mg/dL).   Medical History: Past Medical History:  Diagnosis Date   Essential hypertension    HFrEF (heart failure with reduced ejection fraction) (HCC)    Mitral regurgitation     Assessment: Patient is a 77 y.o. male who presented to the ED on 02/10/2023 with SOB and chest pain. PMH includes HFrEF with EF < 20 RV mod reduced and mildly enlarged, mod pulm artery pressure, LA mod dilated, RA mildly dilated, mod pleural effusion, severe MVR. On admission BNP > 4,500 and troponins 13,606. 12/3 taken for Right/Left heart cath and coronary angiography. Showed severe three-vessel disease. Post procedure recommends CABG +/- MVR. Pharmacy consulted to dose and manage IV heparin.   Heparin level is therapeutic at 0.66 on heparin drip 1350 uts/hr  CBC stable no bleeding noted   Goal of Therapy:  Heparin level 0.3-0.7 units/ml Monitor platelets by anticoagulation protocol: Yes   Plan:  Continue heparin 1350 units/h Daily heparin level and CBC    Leota Sauers Pharm.D. CPP, BCPS Clinical Pharmacist [phone] 02/22/2023 12:23 PM   Please check AMION for all [facility] Pharmacy numbers 02/22/2023

## 2023-02-22 NOTE — Progress Notes (Signed)
VASCULAR LAB    Carotid duplex has been performed.  See CV proc for preliminary results.  Messaged results to Darius Bump via secure chat  Simisola Sandles, RVT 02/22/2023, 3:21 PM

## 2023-02-22 NOTE — Progress Notes (Addendum)
Advanced Heart Failure Rounding Note  PCP-Cardiologist: None   Subjective:    Scheduled for CABG but postponed due to being COVID +. Now on Plaxlovid.      Denies chest pain. Denies shortness of breath. Told me he saw a baby in a bassinet outside of his room. When asked orientation questions he is A&O x3. Walked 500 feet with mobility team.   Objective:   Weight Range: 48.5 kg Body mass index is 16.26 kg/m.   Vital Signs:   Temp:  [97.8 F (36.6 C)-98.1 F (36.7 C)] 97.8 F (36.6 C) (12/12 0324) Pulse Rate:  [56-115] 68 (12/12 1027) Resp:  [16-18] 18 (12/12 0324) BP: (107-125)/(49-91) 107/49 (12/12 1027) Weight:  [48.5 kg] 48.5 kg (12/12 0527) Last BM Date : 02/16/23  Weight change: Filed Weights   02/20/23 0400 02/21/23 0308 02/22/23 0527  Weight: 57.7 kg 49.3 kg 48.5 kg    Intake/Output:   Intake/Output Summary (Last 24 hours) at 02/22/2023 1059 Last data filed at 02/22/2023 0215 Gross per 24 hour  Intake 449.03 ml  Output --  Net 449.03 ml    Physical Exam  General:  Well appearing. No resp difficulty. Sitting in the chair.  HEENT: normal Neck: supple. no JVD. Carotids 2+ bilat; no bruits. No lymphadenopathy or thryomegaly appreciated. Cor: PMI nondisplaced. Regular rate & rhythm. No rubs, gallops or murmurs. Lungs: clear Abdomen: soft, nontender, nondistended. No hepatosplenomegaly. No bruits or masses. Good bowel sounds. Extremities: no cyanosis, clubbing, rash, edema Neuro: alert & orientedx3, cranial nerves grossly intact. moves all 4 extremities w/o difficulty. Affect pleasant  Telemetry  SR 60-70s   EKG    No new EKG to review  Labs    CBC Recent Labs    02/22/23 0346  WBC 7.4  HGB 11.6*  HCT 36.5*  MCV 83.7  PLT 323   Basic Metabolic Panel Recent Labs    57/84/69 0424 02/22/23 0346  NA 134* 136  K 4.2 4.4  CL 104 101  CO2 25 26  GLUCOSE 79 86  BUN 13 14  CREATININE 0.86 0.87  CALCIUM 8.8* 9.3   Liver Function  Tests No results for input(s): "AST", "ALT", "ALKPHOS", "BILITOT", "PROT", "ALBUMIN" in the last 72 hours.  No results for input(s): "LIPASE", "AMYLASE" in the last 72 hours. Cardiac Enzymes No results for input(s): "CKTOTAL", "CKMB", "CKMBINDEX", "TROPONINI" in the last 72 hours.  BNP: BNP (last 3 results) Recent Labs    02/10/23 1120  BNP >4,500.0*   ProBNP (last 3 results) Recent Labs    10/12/22 1030 10/19/22 0852 01/08/23 0957  PROBNP 2,878.0* 2,297.0* >4978.0*   D-Dimer No results for input(s): "DDIMER" in the last 72 hours. Hemoglobin A1C No results for input(s): "HGBA1C" in the last 72 hours.  Fasting Lipid Panel No results for input(s): "CHOL", "HDL", "LDLCALC", "TRIG", "CHOLHDL", "LDLDIRECT" in the last 72 hours. Thyroid Function Tests No results for input(s): "TSH", "T4TOTAL", "T3FREE", "THYROIDAB" in the last 72 hours.  Invalid input(s): "FREET3"  Other results:  Imaging   No results found.  Medications:   Scheduled Medications:  aspirin  81 mg Oral Daily   Chlorhexidine Gluconate Cloth  6 each Topical Once   And   Chlorhexidine Gluconate Cloth  6 each Topical Once   digoxin  0.125 mg Oral Daily   furosemide  20 mg Oral Daily   metoprolol succinate  12.5 mg Oral Daily   nirmatrelvir/ritonavir  3 tablet Oral BID   sodium chloride flush  3  mL Intravenous Q12H   spironolactone  25 mg Oral Daily    Infusions:  heparin 1,350 Units/hr (02/22/23 0807)    PRN Medications: acetaminophen, ondansetron (ZOFRAN) IV, sodium chloride flush  Patient Profile   [name] is a 77 y.o. history of HTN, MR, HFrEF, and severe 3V CAD.  Transferred to [facility] for CT surgery consultation for possible CABG.   Assessment/Plan   1. CAD: Patient admitted with NSTEMI (?culprit D2) but with severe extensive diffuse 3 vessel disease.  Disease too extensive to treat percutaneously.  Anatomy would be best served by CABG, but CABG would be moderate-high risk given  cardiomyopathy.  Echo 12/4 showed mild-moderate RV dysfunction and PAPi was adequate on RHC, so do not think RV function will be a limitation.  Patient seems to be reasonably active at baseline without significant comorbidities. - We could obtain a viability study, but LV walls are not severely thinned and presence/absence of viability has not been shown to have a significant impact on outcome for CABG in heart failure - TCTS high risk CABG now postponed to 12/19 d/t COVID + being treated w Paxlovid - Vascular US later today.  - No chest pain.  - On heparin gtt. - Continue ASA 81 - Holding statin w/ mildly elevated LFTs while being treated w/ Plaxlovid (can increase statin concentrations/increase risk for myopathy)  2. Acute on chronic systolic CHF: Patient has had HF symptoms for at least a couple of months.  Suspect pre-existing ischemic cardiomyopathy then he presented this admission with NSTEMI possible in D2 territory leading to CHF exacerbation. He has been diuresed, RHC 12/3 showed normal filling pressures and low CI at 2.0. Echo 12/4: EF 20%, RV mild-moderately dysfunctional, mild-moderate MR. Suspect patient's best chance to improve LV function will be CABG.  - Volume status appears stable.   - Continue Lasix 20 PO daily - Continue Digoxin 0.125 daily.  - Continue Spironolactone 25 daily.  - Continue Metoprolol 12.5 daily - Hold on adding SGLT2i until post CABG  -Renal function stable   3. Delirium: Suspect some underlying mild demential.  Patient has had on and off confusion in hospital. He is A&Ox3 but had some confusion about   He would be at risk for worsening post-op delirium in setting of CABG.    4. Gallbladder disease: Patient had gallstones but HIDA scan not suggestive of cholecystitis.  5. COVID+: on Paxlovid   6. Fall: Had inpatient fall. No injuries. Did not hit his head.   Social: Patient would like to discharge home and come back as outpatient for CABG. Per pt, son  is on the way from [facility] to help patient at home prior to surgery.   Length of Stay: 8  Amy Clegg, NP  02/22/2023, 10:59 AM  Advanced Heart Failure Team Pager [phone] (M-F; 7a - 5p)  Please contact [facility] for night-coverage after hours (5p -7a ) and weekends on [email or web address]  Patient seen and examined with the above-signed Advanced Practice Provider and/or Housestaff. I personally reviewed laboratory data, imaging studies and relevant notes. I independently examined the patient and formulated the important aspects of the plan. I have edited the note to reflect any of my changes or salient points. I have personally discussed the plan with the patient and/or family.  Feeling better. Walking halls. Anxious to go home. No CP or SOB.  Covid cough improved  General:  Sitting in chair. Thin  No resp difficulty HEENT: normal Neck: supple. no JVD.  Cor: PMI nondisplaced.  Regular rate & rhythm. 2/6 MR Lungs: clear decreased  Abdomen: soft, nontender, nondistended. No hepatosplenomegaly. No bruits or masses. Good bowel sounds. Extremities: no cyanosis, clubbing, rash, edema Neuro: alert & orientedx3, cranial nerves grossly intact. moves all 4 extremities w/o difficulty. Affect pleasant  Stable from cardiac perspective. I spoke with his son and they do not feel like he will be able to go home with family prior to CABG so will keep in house.   Carotid u/s with 80-99% L CEA. VVS consult pending  I have asked son to help clarify post-op care plan (I.e. where he will go after SNF stay post-d/c). They will get back to me.   Time spent 35 minutes   Arvilla Meres, MD  5:25 PM

## 2023-02-22 NOTE — Plan of Care (Signed)
  Problem: Clinical Measurements: Goal: Ability to maintain clinical measurements within normal limits will improve Outcome: Progressing Goal: Will remain free from infection Outcome: Progressing Goal: Diagnostic test results will improve Outcome: Progressing Goal: Respiratory complications will improve Outcome: Progressing Goal: Cardiovascular complication will be avoided Outcome: Progressing   Problem: Activity: Goal: Risk for activity intolerance will decrease Outcome: Progressing   Problem: Elimination: Goal: Will not experience complications related to bowel motility Outcome: Progressing Goal: Will not experience complications related to urinary retention Outcome: Progressing   Problem: Safety: Goal: Ability to remain free from injury will improve Outcome: Progressing

## 2023-02-22 NOTE — Consult Note (Signed)
Hospital Consult    Reason for Consult: Carotid artery stenosis Requesting Physician: Dr. Orson Aloe  MRN #:  [id number]  History of Present Illness: This is a 77 y.o. male with a history of CAD, HFrEF, mitral regurg and hypertension. He originally presented about 1 week ago with shortness of breath, confusion and lower extremity edema.  He was found to have an NSTEMI in the setting of acute on chronic CHF.  Left heart cath demonstrated severe three-vessel CAD and an echo demonstrated an EF of 20% with global hypokinesis, mild concentric LV hypertrophy and pulmonary hypertension with an RSVP of 66. He was planned to undergo CABG and mitral valve repair but recently had a positive COVID test so is now rescheduled for 1219.  On preop workup a carotid duplex noted a severe, greater than 80% left ICA stenosis with an EDV of 131. He denies any previous CVA or TIA symptoms.  Specifically he denies any one-sided weakness, numbness, amaurosis or speech issues.  He is a former smoker.  He is currently on aspirin and high intensity statin therapy.  No previous neck surgeries or radiation.    Past Medical History:  Diagnosis Date   Essential hypertension    HFrEF (heart failure with reduced ejection fraction) (HCC)    Mitral regurgitation     Past Surgical History:  Procedure Laterality Date   NO PAST SURGERIES     RIGHT/LEFT HEART CATH AND CORONARY ANGIOGRAPHY N/A 02/13/2023   Procedure: RIGHT/LEFT HEART CATH AND CORONARY ANGIOGRAPHY;  Surgeon: Yvonne Kendall, MD;  Location: [facility] INVASIVE CV [facility];  Service: Cardiovascular;  Laterality: N/A;    No Active Allergies  Prior to Admission medications   Medication Sig Start Date End Date Taking? Authorizing Provider  aspirin EC 81 MG tablet Take 81 mg by mouth daily.   Yes [provider]  metoprolol succinate (TOPROL-XL) 25 MG 24 hr tablet Take 25 mg by mouth daily.   Yes [provider]  Multiple Vitamins-Minerals (MULTIVITAMIN  MEN 50+) TABS Take 1 tablet by mouth daily.   Yes [provider]  atorvastatin (LIPITOR) 80 MG tablet Take 1 tablet (80 mg total) by mouth daily. Patient not taking: Reported on 02/14/2023 02/15/23   Leeroy Bock, MD  furosemide (LASIX) 20 MG tablet Take 20 mg by mouth daily. Patient not taking: Reported on 02/14/2023    [provider]  heparin 78295 UT/250ML infusion Inject 1,250 Units/hr into the vein continuous. Patient not taking: Reported on 02/14/2023 02/14/23   Leeroy Bock, MD  spironolactone (ALDACTONE) 25 MG tablet Take 0.5 tablets (12.5 mg total) by mouth daily. Patient not taking: Reported on 02/14/2023 02/15/23   Leeroy Bock, MD    Social History   Socioeconomic History   Marital status: Single    Spouse name: Not on file   Number of children: Not on file   Years of education: Not on file   Highest education level: Not on file  Occupational History   Not on file  Tobacco Use   Smoking status: Former    Current packs/day: 0.00    Average packs/day: 0.5 packs/day for 10.0 years (5.0 ttl pk-yrs)    Types: Cigarettes    Start date: 61    Quit date: 65    Years since quitting: 46.9   Smokeless tobacco: Never  Vaping Use   Vaping status: Never Used  Substance and Sexual Activity   Alcohol use: Not Currently    Comment: no alcohol since 77 years  old   Drug use: Never   Sexual activity: Not Currently  Other Topics Concern   Not on file  Social History Narrative   Not on file   Social Drivers of Health   Financial Resource Strain: Not on file  Food Insecurity: No Food Insecurity (02/14/2023)   Hunger Vital Sign    Worried About Running Out of Food in the Last Year: Never true    Ran Out of Food in the Last Year: Never true  Transportation Needs: No Transportation Needs (02/14/2023)   PRAPARE - Administrator, Civil Service (Medical): No    Lack of Transportation (Non-Medical): No  Physical Activity: Not on file   Stress: Not on file  Social Connections: Not on file  Intimate Partner Violence: Not At Risk (02/14/2023)   Humiliation, Afraid, Rape, and Kick questionnaire    Fear of Current or Ex-Partner: No    Emotionally Abused: No    Physically Abused: No    Sexually Abused: No    Family History  Problem Relation Age of Onset   Diabetes Mother        gestational diabetes   Hypertension Father    Alcohol abuse Father    Heart disease Brother    Alcohol abuse Brother    Drug abuse Brother     ROS: Otherwise negative unless mentioned in HPI  Physical Examination  Vitals:   02/22/23 1027 02/22/23 1337  BP: (!) 107/49   Pulse: 68 71  Resp:  18  Temp:  98.5 F (36.9 C)  SpO2:     Body mass index is 16.26 kg/m.  General: no acute distress Cardiac: hemodynamically stable, nontachycardic Neuro: alert, no focal deficit Extremities: No edema, cyanosis or wounds Vascular: Palpable radials bilaterally   Data:   Right Carotid Findings:  +----------+--------+--------+--------+------------+------------------+           PSV cm/sEDV cm/sStenosisDescribe    Comments            +----------+--------+--------+--------+------------+------------------+  CCA Prox  103     21                          intimal thickening  +----------+--------+--------+--------+------------+------------------+  CCA Distal96      25                          intimal thickening  +----------+--------+--------+--------+------------+------------------+  ICA Prox  79      19      1-39%   heterogenoustortuous            +----------+--------+--------+--------+------------+------------------+  ICA Mid   188     40                          tortuous            +----------+--------+--------+--------+------------+------------------+  ICA Distal102     40                          tortuous            +----------+--------+--------+--------+------------+------------------+  ECA      145      22                          tortuous            +----------+--------+--------+--------+------------+------------------+   +----------+--------+-------+------------+------------+  PSV cm/sEDV cmsDescribe    Arm Pressure  +----------+--------+-------+------------+------------+  Subclavian              Not assessed              +----------+--------+-------+------------+------------+   +---------+--------+--+--------+--+  VertebralPSV cm/s86EDV cm/s23  +---------+--------+--+--------+--+  Elevated velocities noted mid ICA, possibly secondary to tortuosity.  Left Carotid Findings:  +----------+--------+--------+--------+------------+--------+           PSV cm/sEDV cm/sStenosisDescribe    Comments  +----------+--------+--------+--------+------------+--------+  CCA Prox  66      10              homogeneous           +----------+--------+--------+--------+------------+--------+  CCA Distal88      11              homogeneous           +----------+--------+--------+--------+------------+--------+  ICA Prox  392     131     80-99%  heterogenoustortuous  +----------+--------+--------+--------+------------+--------+  ICA Mid   144     26                          tortuous  +----------+--------+--------+--------+------------+--------+  ICA Distal46      17                          tortuous  +----------+--------+--------+--------+------------+--------+  ECA      193     3                                     +----------+--------+--------+--------+------------+--------+   CT chest reviewed  Echo reviewed  BMP reviewed, creatinine 0.87  ASSESSMENT/PLAN: This is a 77 y.o. male with severe three-vessel CAD, HFrEF, mitral regurg and asymptomatic severe left ICA stenosis.  We discussed the natural history of carotid stenosis and his risk of stroke in the next 2 years approximately 10 to 15% with best medical management.   We briefly discussed the options of CEA and TCAR.  We discussed that since he is asymptomatic we will plan for follow-up in about 1 month with a CTA head and neck.  This should give him time for recovery from his CABG and mitral valve repair and at that time we can discuss carotid intervention.   Daria Pastures MD [facility] [phone] 02/22/2023  4:53 PM

## 2023-02-23 DIAGNOSIS — I5021 Acute systolic (congestive) heart failure: Secondary | ICD-10-CM | POA: Diagnosis not present

## 2023-02-23 LAB — HEPARIN LEVEL (UNFRACTIONATED): Heparin Unfractionated: 0.51 [IU]/mL (ref 0.30–0.70)

## 2023-02-23 LAB — CBC
HCT: 35.9 % — ABNORMAL LOW (ref 39.0–52.0)
Hemoglobin: 11.4 g/dL — ABNORMAL LOW (ref 13.0–17.0)
MCH: 26.5 pg (ref 26.0–34.0)
MCHC: 31.8 g/dL (ref 30.0–36.0)
MCV: 83.5 fL (ref 80.0–100.0)
Platelets: 336 10*3/uL (ref 150–400)
RBC: 4.3 MIL/uL (ref 4.22–5.81)
RDW: 20 % — ABNORMAL HIGH (ref 11.5–15.5)
WBC: 8.8 10*3/uL (ref 4.0–10.5)
nRBC: 0 % (ref 0.0–0.2)

## 2023-02-23 LAB — BASIC METABOLIC PANEL
Anion gap: 8 (ref 5–15)
BUN: 16 mg/dL (ref 8–23)
CO2: 25 mmol/L (ref 22–32)
Calcium: 9.1 mg/dL (ref 8.9–10.3)
Chloride: 100 mmol/L (ref 98–111)
Creatinine, Ser: 0.73 mg/dL (ref 0.61–1.24)
GFR, Estimated: >60 mL/min (ref >60)
Glucose, Bld: 82 mg/dL (ref 70–99)
Potassium: 4.5 mmol/L (ref 3.5–5.1)
Sodium: 133 mmol/L — ABNORMAL LOW (ref 135–145)

## 2023-02-23 NOTE — TOC Progression Note (Addendum)
Transition of Care [facility]) - Progression Note    Patient Details  Name: Walter Vargas MRN: [id number] Date of Birth: 11-30-1945  Transition of Care [facility]) CM/SW Contact  Nicanor Bake Phone Number: [phone] 02/23/2023, 1:56 PM  Clinical Narrative:   1:56PM-HF CSW called to speak with pts sister, Burns Spain and daughter, [name] and left a VM asking to be contacted back. Both of their contact information is located on the face sheet. CSW will attempt to make contact with other family.   3:24 PM- Pts daughter, [name] called the CSW back to discuss a plan for th ept. [name] stated that Remi Deter, who is also known as [name] will not be able to take the pt in because of the distance. [name] stated that she has small kids and works FT with a busy schedule and it would be challenging. [name] stated that she cannot go to [place], but the pt could potentially come to [place] with her. [name] stated that she is going to contact her aunt, Burns Spain to see if she would be willing to take her brother in. [name] stated that she and her husband are both retired and have the time to help meet pts needs. [name] stated that she is going to check-in with her aunt and follow up with CSW early next week with a plan. Family would like for pt to go to rehab after surgery and trying to create a plan.   TOC will continue following.          Expected Discharge Plan and Services   Discharge Planning Services: CM Consult                     DME Arranged: Walker rolling with seat DME Agency: [employer] Date DME Agency Contacted: 02/21/23 Time DME Agency Contacted: 1341 Representative spoke with at DME Agency: Vaughan Basta             Social Determinants of Health (SDOH) Interventions SDOH Screenings   Food Insecurity: No Food Insecurity (02/14/2023)  Housing: Low Risk  (02/14/2023)  Transportation Needs: No Transportation Needs (02/14/2023)  Utilities: Not At Risk (02/14/2023)  Depression (PHQ2-9): Low  Risk  (10/19/2022)  Tobacco Use: Medium Risk (02/14/2023)    Readmission Risk Interventions     No data to display

## 2023-02-23 NOTE — Progress Notes (Signed)
Physical Therapy Treatment Patient Details Name: Walter Vargas MRN: [id number] DOB: Mar 23, 1945 Today's Date: 02/23/2023   History of Present Illness 77 y.o. male admitted 02/13/22 with NSTEMI, CHF exacerbation. LHC with 3-vessel CAD. Plan for CABG with possible MVR but pt covid + so surgery delayed until possibly 12/19. Pt with fall in room on 12/9. PMH includes HTN, HF, mod/severe MR.    PT Comments  Patient was eager to walk. With 4 wheeled walker, patient ambulates a normal gait speed with supervision. No significant shortness of breath noted with activity. Short distance ambulation performed in room with CGA and patient with tendency to reach for furniture for support. Recommend to continue using assistive device for safety. Had patient try standing from bed without UE support in preparation for upcoming CABG. He required Mod A with maximal cues for technique to stand without using UE. Recommend to continue PT and supervision if going home prior to anticipated surgery. PT will continue to follow    If plan is discharge home, recommend the following: Assistance with cooking/housework;Direct supervision/assist for medications management;Direct supervision/assist for financial management;Assist for transportation;Supervision due to cognitive status   Can travel by private vehicle        Equipment Recommendations  Rollator (4 wheels)    Recommendations for Other Services       Precautions / Restrictions Precautions Precautions: Fall Restrictions Weight Bearing Restrictions Per Provider Order: No     Mobility  Bed Mobility Overal bed mobility: Modified Independent                  Transfers Overall transfer level: Needs assistance Equipment used: Rollator (4 wheels) Transfers: Sit to/from Stand Sit to Stand: Contact guard assist           General transfer comment: verbal cues for safety. after hallway ambulation, had patient attempt standing without use of UE support  in preparation for upcoming CABG. with cues for technique, patient required Mod A to stand without use of UE    Ambulation/Gait Ambulation/Gait assistance: Supervision Gait Distance (Feet): 300 Feet Assistive device: Rollator (4 wheels) Gait Pattern/deviations: Step-through pattern, Decreased stride length Gait velocity: normal     General Gait Details: supervision for safety. patient walked a short distance in the room without device with CGA required for safety and occasionally reacing out for furniture. recommend to continue using assistive device for ambulation for safety at this time   Stairs             Wheelchair Mobility     Tilt Bed    Modified Rankin (Stroke Patients Only)       Balance Overall balance assessment: Needs assistance Sitting-balance support: No upper extremity supported, Feet supported Sitting balance-Leahy Scale: Good     Standing balance support: No upper extremity supported, During functional activity Standing balance-Leahy Scale: Fair                              Cognition Arousal: Alert Behavior During Therapy: Restless Overall Cognitive Status: History of cognitive impairments - at baseline                                 General Comments: patient needs occasional attention to task and is easily distracted at times.        Exercises      General Comments  Pertinent Vitals/Pain Pain Assessment Pain Assessment: No/denies pain    Home Living                          Prior Function            PT Goals (current goals can now be found in the care plan section) Acute Rehab PT Goals Patient Stated Goal: return home PT Goal Formulation: With patient/family Time For Goal Achievement: 03/03/23 Potential to Achieve Goals: Good Progress towards PT goals: Progressing toward goals    Frequency    Min 1X/week      PT Plan      Co-evaluation              AM-PAC PT "6  Clicks" Mobility   Outcome Measure  Help needed turning from your back to your side while in a flat bed without using bedrails?: None Help needed moving from lying on your back to sitting on the side of a flat bed without using bedrails?: None Help needed moving to and from a bed to a chair (including a wheelchair)?: A Little Help needed standing up from a chair using your arms (e.g., wheelchair or bedside chair)?: A Little Help needed to walk in hospital room?: A Little Help needed climbing 3-5 steps with a railing? : A Little 6 Click Score: 20    End of Session   Activity Tolerance: Patient tolerated treatment well Patient left: in chair;with call bell/phone within reach;with chair alarm set Nurse Communication: Mobility status PT Visit Diagnosis: Other abnormalities of gait and mobility (R26.89)     Time: 1610-9604 PT Time Calculation (min) (ACUTE ONLY): 32 min  Charges:    $Therapeutic Activity: 23-37 mins PT General Charges $$ ACUTE PT VISIT: 1 Visit                     Donna Bernard, PT, MPT    [employer] 02/23/2023, 12:30 PM

## 2023-02-23 NOTE — Progress Notes (Signed)
PHARMACY - ANTICOAGULATION CONSULT NOTE  Pharmacy Consult for Heparin Indication: NSTEMI, multi-vessel CAD  No Active Allergies   Patient Measurements: Height: 68"  Weight: 55.7 kg (initial weight 56.5 kg, repeated due to weight 63.7 kg at [facility]) - lasix 60 IV q12h x 4 doses given at [facility] 12/1>>12/3 IBW/kg: 68.4 kg Heparin Dosing Weight: 55.7 kg   Vital Signs: Temp: 97.4 F (36.3 C) (12/13 0430) Temp Source: Oral (12/13 0430) BP: 120/63 (12/13 0430) Pulse Rate: 66 (12/13 0430)  Labs: Recent Labs    02/21/23 0424 02/22/23 0346 02/23/23 0431  HGB  --  11.6* 11.4*  HCT  --  36.5* 35.9*  PLT  --  323 336  HEPARINUNFRC 0.42 0.66 0.51  CREATININE 0.86 0.87 0.73    Estimated Creatinine Clearance: 60.6 mL/min (by C-G formula based on SCr of 0.73 mg/dL).   Medical History: Past Medical History:  Diagnosis Date   Essential hypertension    HFrEF (heart failure with reduced ejection fraction) (HCC)    Mitral regurgitation     Assessment: Patient is a 77 y.o. male who presented to the ED on 02/10/2023 with SOB and chest pain. PMH includes HFrEF with EF < 20 RV mod reduced and mildly enlarged, mod pulm artery pressure, LA mod dilated, RA mildly dilated, mod pleural effusion, severe MVR. On admission BNP > 4,500 and troponins 13,606. 12/3 taken for Right/Left heart cath and coronary angiography. Showed severe three-vessel disease. Post procedure recommends CABG +/- MVR. Pharmacy consulted to dose and manage IV heparin until OR  Heparin level is therapeutic at 0.51 on heparin drip 1350 uts/hr  CBC stable no bleeding noted    Goal of Therapy:  Heparin level 0.3-0.7 units/ml Monitor platelets by anticoagulation protocol: Yes   Plan:  Continue heparin 1350 units/h Daily heparin level and CBC    Leota Sauers Pharm.D. CPP, BCPS Clinical Pharmacist [phone] 02/23/2023 10:23 AM   Please check AMION for all [facility] numbers 02/23/2023

## 2023-02-23 NOTE — Progress Notes (Addendum)
Advanced Heart Failure Rounding Note  PCP-Cardiologist: None   Subjective:    Scheduled for CABG but postponed due to being COVID +. Completed Paxlovid.  Feeling well. Reports he has already walked the halls today. No dyspnea.     Objective:   Weight Range: 55.4 kg Body mass index is 18.58 kg/m.   Vital Signs:   Temp:  [97.4 F (36.3 C)-98.5 F (36.9 C)] 97.4 F (36.3 C) (12/13 0430) Pulse Rate:  [66-71] 66 (12/13 0430) Resp:  [16-18] 16 (12/13 0430) BP: (107-120)/(49-68) 120/63 (12/13 0430) SpO2:  [99 %-100 %] 100 % (12/13 0430) Weight:  [55.4 kg] 55.4 kg (12/13 0430) Last BM Date : 02/16/23  Weight change: Filed Weights   02/21/23 0308 02/22/23 0527 02/23/23 0430  Weight: 49.3 kg 48.5 kg 55.4 kg    Intake/Output:   Intake/Output Summary (Last 24 hours) at 02/23/2023 0959 Last data filed at 02/23/2023 0530 Gross per 24 hour  Intake 480 ml  Output 650 ml  Net -170 ml    Physical Exam  General:  Thin elderly male. Sitting up in chair.  HEENT: normal Neck: supple. no JVD. Carotids 2+ bilat; no bruits.  Cor: PMI nondisplaced. Regular rate & rhythm. No rubs, gallops, 2/6 Walter murmur Lungs: clear Abdomen: soft, nontender, nondistended.  Extremities: no cyanosis, clubbing, rash, edema Neuro: alert & orientedx3. Affect pleasant   Telemetry  SR 70s, PACs, ? Short run of SVT this am  Labs    CBC Recent Labs    02/22/23 0346 02/23/23 0431  WBC 7.4 8.8  HGB 11.6* 11.4*  HCT 36.5* 35.9*  MCV 83.7 83.5  PLT 323 336   Basic Metabolic Panel Recent Labs    56/38/75 0346 02/23/23 0431  NA 136 133*  K 4.4 4.5  CL 101 100  CO2 26 25  GLUCOSE 86 82  BUN 14 16  CREATININE 0.87 0.73  CALCIUM 9.3 9.1   Liver Function Tests No results for input(s): "AST", "ALT", "ALKPHOS", "BILITOT", "PROT", "ALBUMIN" in the last 72 hours.  No results for input(s): "LIPASE", "AMYLASE" in the last 72 hours. Cardiac Enzymes No results for input(s): "CKTOTAL",  "CKMB", "CKMBINDEX", "TROPONINI" in the last 72 hours.  BNP: BNP (last 3 results) Recent Labs    02/10/23 1120  BNP >4,500.0*   ProBNP (last 3 results) Recent Labs    10/12/22 1030 10/19/22 0852 01/08/23 0957  PROBNP 2,878.0* 2,297.0* >4978.0*   D-Dimer No results for input(s): "DDIMER" in the last 72 hours. Hemoglobin A1C No results for input(s): "HGBA1C" in the last 72 hours.  Fasting Lipid Panel No results for input(s): "CHOL", "HDL", "LDLCALC", "TRIG", "CHOLHDL", "LDLDIRECT" in the last 72 hours. Thyroid Function Tests No results for input(s): "TSH", "T4TOTAL", "T3FREE", "THYROIDAB" in the last 72 hours.  Invalid input(s): "FREET3"  Other results:  Imaging   VAS US DOPPLER PRE CABG Result Date: 02/22/2023 PREOPERATIVE VASCULAR EVALUATION Patient Name:  Walter Vargas  Date of Exam:   02/22/2023 Medical Rec #: [id number]      Accession #:    [id number] Date of Birth: June 06, 1945      Patient Gender: M Patient Age:   78 years Exam Location:  [facility] Procedure:      VAS US DOPPLER PRE CABG Referring Phys: Avon Gully SIMMONS --------------------------------------------------------------------------------  Indications:      Pre-CABG. Risk Factors:     Hypertension, past history of smoking, prior MI, coronary  artery disease. Other Factors:    Covid 19 Positive, HFrEF. Limitations:      Body habitus, dementia, movement, tortuosity of vessels Comparison Study: No prior study on file Performing Technologist: Sherren Kerns RVS  Examination Guidelines: A complete evaluation includes B-mode imaging, spectral Doppler, color Doppler, and power Doppler as needed of all accessible portions of each vessel. Bilateral testing is considered an integral part of a complete examination. Limited examinations for reoccurring indications may be performed as noted.  Right Carotid Findings: +----------+--------+--------+--------+------------+------------------+           PSV  cm/sEDV cm/sStenosisDescribe    Comments           +----------+--------+--------+--------+------------+------------------+ CCA Prox  103     21                          intimal thickening +----------+--------+--------+--------+------------+------------------+ CCA Distal96      25                          intimal thickening +----------+--------+--------+--------+------------+------------------+ ICA Prox  79      19      1-39%   heterogenoustortuous           +----------+--------+--------+--------+------------+------------------+ ICA Mid   188     40                          tortuous           +----------+--------+--------+--------+------------+------------------+ ICA Distal102     40                          tortuous           +----------+--------+--------+--------+------------+------------------+ ECA       145     22                          tortuous           +----------+--------+--------+--------+------------+------------------+ +----------+--------+-------+------------+------------+           PSV cm/sEDV cmsDescribe    Arm Pressure +----------+--------+-------+------------+------------+ Subclavian               Not assessed             +----------+--------+-------+------------+------------+ +---------+--------+--+--------+--+ VertebralPSV cm/s86EDV cm/s23 +---------+--------+--+--------+--+ Elevated velocities noted mid ICA, possibly secondary to tortuosity. Left Carotid Findings: +----------+--------+--------+--------+------------+--------+           PSV cm/sEDV cm/sStenosisDescribe    Comments +----------+--------+--------+--------+------------+--------+ CCA Prox  66      10              homogeneous          +----------+--------+--------+--------+------------+--------+ CCA Distal88      11              homogeneous          +----------+--------+--------+--------+------------+--------+ ICA Prox  392     131     80-99%   heterogenoustortuous +----------+--------+--------+--------+------------+--------+ ICA Mid   144     26                          tortuous +----------+--------+--------+--------+------------+--------+ ICA Distal46      17  tortuous +----------+--------+--------+--------+------------+--------+ ECA       193     3                                    +----------+--------+--------+--------+------------+--------+ +----------+--------+--------+------------+------------+ SubclavianPSV cm/sEDV cm/sDescribe    Arm Pressure +----------+--------+--------+------------+------------+                           Not assessed             +----------+--------+--------+------------+------------+ +---------+--------+--+--------+-+ VertebralPSV cm/s40EDV cm/s4 +---------+--------+--+--------+-+   Summary: Right Carotid: Velocities in the right ICA are consistent with a 1-39% stenosis. Left Carotid: Velocities in the left ICA are consistent with a 80-99% stenosis. Vertebrals:  Bilateral vertebral arteries demonstrate antegrade flow. Subclavians: Not assessed.  Electronically signed by Carolynn Sayers on 02/22/2023 at 5:17:07 PM.    Final     Medications:   Scheduled Medications:  aspirin  81 mg Oral Daily   Chlorhexidine Gluconate Cloth  6 each Topical Once   And   Chlorhexidine Gluconate Cloth  6 each Topical Once   digoxin  0.125 mg Oral Daily   furosemide  20 mg Oral Daily   metoprolol succinate  12.5 mg Oral Daily   polyethylene glycol  17 g Oral Daily   senna-docusate  2 tablet Oral QHS   sodium chloride flush  3 mL Intravenous Q12H   spironolactone  25 mg Oral Daily    Infusions:  heparin 1,350 Units/hr (02/23/23 0150)    PRN Medications: acetaminophen, ondansetron (ZOFRAN) IV, sodium chloride flush  Patient Profile   Walter Vargas is a 77 y.o. history of HTN, Walter, HFrEF, and severe 3V CAD.  Transferred to [facility] for CT surgery consultation for possible  CABG.   Assessment/Plan   1. CAD: Patient admitted with NSTEMI (?culprit D2) but with severe extensive diffuse 3 vessel disease.  Disease too extensive to treat percutaneously.  Anatomy would be best served by CABG, but CABG would be moderate-high risk given cardiomyopathy.  Echo 12/4 showed mild-moderate RV dysfunction and PAPi was adequate on RHC, so do not think RV function will be a limitation.   - We could obtain a viability study, but LV walls are not severely thinned and presence/absence of viability has not been shown to have a significant impact on outcome for CABG in heart failure - High risk CABG now postponed to 12/19 d/t COVID + being treated w Paxlovid - No chest pain.  - On heparin gtt. - Continue ASA 81 - Held statin w/ mildly elevated LFTs while being treated w/ Plaxlovid. Will need to restart prior to discharge.  2. Acute on chronic systolic CHF: Patient has had HF symptoms for at least a couple of months.  Suspect pre-existing ischemic cardiomyopathy then he presented this admission with NSTEMI possible in D2 territory leading to CHF exacerbation. He has been diuresed, RHC 12/3 showed normal filling pressures and low CI at 2.0. Echo 12/4: EF 20%, RV mild-moderately dysfunctional, mild-moderate Walter. Suspect patient's best chance to improve LV function will be CABG.  - Volume status appears stable.   - Continue Lasix 20 PO daily - Continue Digoxin 0.125 daily.  - Continue Spironolactone 25 daily.  - Continue Metoprolol 12.5 daily - Hold on adding SGLT2i and ARB/ARNi until post CABG  -Renal function stable   3. 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  Patient has had on and off  confusion in hospital.  - He would be at risk for worsening post-op delirium in setting of CABG.    4. Gallbladder disease: Patient had gallstones but HIDA scan not suggestive of cholecystitis.  5. COVID+: Completed Paxlovid  6. Fall: Had inpatient fall. No injuries. Did not hit his head.    7. 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    He cannot go home with family pre CABG, planning to stay here until surgery. Family confirming post-op plan. Patient states he has family in [place] and [place] but is not aware of a confirmed plan.   Length of Stay: 9  FINCH, LINDSAY N, PA-C  02/23/2023, 9:59 AM  Advanced Heart Failure Team Pager [phone] (M-F; 7a - 5p)  Please contact [facility] for night-coverage after hours (5p -7a ) and weekends on [email or web address]  Patient seen and examined with the above-signed Advanced Practice Provider and/or Housestaff. I personally reviewed laboratory data, imaging studies and relevant notes. I independently examined the patient and formulated the important aspects of the plan. I have edited the note to reflect any of my changes or salient points. I have personally discussed the plan with the patient and/or family.  Denies CP or SOB. COVID symptoms resolved. Seen by [facility] for left carotid stenosis  General:  thin male sitting up in chair  No resp difficulty HEENT: normal Neck: supple. no JVD. Carotids 2+ bilat; no bruits. No lymphadenopathy or thryomegaly appreciated. CZY:SAYTKZS rate & rhythm. No rubs, gallops or murmurs. Lungs: clear but decreased  Abdomen: soft, nontender, nondistended. No hepatosplenomegaly. No bruits or masses. Good bowel sounds. Extremities: no cyanosis, clubbing, rash, edema Neuro: alert & orientedx3, cranial nerves grossly intact. moves all 4 extremities w/o difficulty. Affect pleasant  Continue current treatment. Plan for CABG Thursday.   [facility]VS has seen and plan will be to address L carotid stenosis post-CABG.  We will reconnect with his son on Monday to make sure there is a family plan to help him with care after CABG and SNF stay.  Arvilla Meres, MD  5:26 PM

## 2023-02-24 DIAGNOSIS — I5023 Acute on chronic systolic (congestive) heart failure: Secondary | ICD-10-CM

## 2023-02-24 LAB — CBC
HCT: 35.3 % — ABNORMAL LOW (ref 39.0–52.0)
Hemoglobin: 11.3 g/dL — ABNORMAL LOW (ref 13.0–17.0)
MCH: 26.7 pg (ref 26.0–34.0)
MCHC: 32 g/dL (ref 30.0–36.0)
MCV: 83.5 fL (ref 80.0–100.0)
Platelets: 357 10*3/uL (ref 150–400)
RBC: 4.23 MIL/uL (ref 4.22–5.81)
RDW: 19.9 % — ABNORMAL HIGH (ref 11.5–15.5)
WBC: 8 10*3/uL (ref 4.0–10.5)
nRBC: 0 % (ref 0.0–0.2)

## 2023-02-24 LAB — BASIC METABOLIC PANEL
Anion gap: 7 (ref 5–15)
BUN: 14 mg/dL (ref 8–23)
CO2: 25 mmol/L (ref 22–32)
Calcium: 9.3 mg/dL (ref 8.9–10.3)
Chloride: 102 mmol/L (ref 98–111)
Creatinine, Ser: 0.75 mg/dL (ref 0.61–1.24)
GFR, Estimated: >60 mL/min (ref >60)
Glucose, Bld: 99 mg/dL (ref 70–99)
Potassium: 4.5 mmol/L (ref 3.5–5.1)
Sodium: 134 mmol/L — ABNORMAL LOW (ref 135–145)

## 2023-02-24 LAB — HEPARIN LEVEL (UNFRACTIONATED): Heparin Unfractionated: 0.53 [IU]/mL (ref 0.30–0.70)

## 2023-02-24 NOTE — Progress Notes (Signed)
   Rounding Note    Patient Name: DEMARKUS USCANGA Date of Encounter: 02/24/2023  [facility] Cardiologist: None   Subjective   NAEO. Feels well today.  Vital Signs    Vitals:   02/23/23 1528 02/23/23 1956 02/24/23 0307 02/24/23 0728  BP: (!) 101/58 125/79 106/78 111/73  Pulse: 76 85 77 68  Resp: 18 16 18 16   Temp: (!) 97.4 F (36.3 C) 98.3 F (36.8 C) 98.5 F (36.9 C) 97.8 F (36.6 C)  TempSrc: Oral Oral Oral Oral  SpO2:  97% 100% 100%  Weight:   54.9 kg   Height:        Intake/Output Summary (Last 24 hours) at 02/24/2023 0853 Last data filed at 02/24/2023 0727 Gross per 24 hour  Intake 758.87 ml  Output 675 ml  Net 83.87 ml      02/24/2023    3:07 AM 02/23/2023    4:30 AM 02/22/2023    5:27 AM  Last 3 Weights  Weight (lbs) 121 lb 122 lb 3.2 oz 106 lb 14.8 oz  Weight (kg) 54.885 kg 55.43 kg 48.5 kg      Telemetry    Personally Reviewed  ECG    Personally Reviewed  Physical Exam   GEN: No acute distress.  Thin. Cardiac: RRR, no murmurs, rubs, or gallops.  Respiratory: Clear to auscultation bilaterally. Psych: Normal affect   Assessment & Plan    #CAD CABG planned for Thursday Cont heparin gtt Cont aspirin  #COVID Treated  #CAS [facility] outpatient follow up  #Acute on chronic systolic heart failure NYHA II today. Cont lasix, digoxin, spiro, metop      Tinesha Siegrist T. Lalla Brothers, MD, [facility], [facility] Cardiac Electrophysiology

## 2023-02-24 NOTE — Plan of Care (Signed)

## 2023-02-24 NOTE — Progress Notes (Signed)
Mobility Specialist Progress Note:    02/24/23 1200  Mobility  Activity Ambulated with assistance in hallway  Level of Assistance Contact guard assist, steadying assist  Assistive Device Four wheel walker  Distance Ambulated (ft) 500 ft  Activity Response Tolerated well  Mobility Referral Yes  Mobility visit 1 Mobility  Mobility Specialist Start Time (ACUTE ONLY) 1200  Mobility Specialist Stop Time (ACUTE ONLY) 1213  Mobility Specialist Time Calculation (min) (ACUTE ONLY) 13 min   Received pt in bed having no complaints and agreeable to mobility. Pt was asymptomatic throughout ambulation and returned to room w/o fault. Left in bed w/ call bell in reach and all needs met. Bed alarm on.   D'Vante Earlene Plater Mobility Specialist Please contact via Special educational needs teacher or Rehab office at [phone]

## 2023-02-24 NOTE — Progress Notes (Signed)
PHARMACY - ANTICOAGULATION CONSULT NOTE  Pharmacy Consult for Heparin Indication: NSTEMI, multi-vessel CAD  No Active Allergies   Patient Measurements: Height: 68"  Weight: 55.7 kg (initial weight 56.5 kg, repeated due to weight 63.7 kg at [facility]) - lasix 60 IV q12h x 4 doses given at [facility] 12/1>>12/3 IBW/kg: 68.4 kg Heparin Dosing Weight: 55.7 kg   Vital Signs: Temp: 97.8 F (36.6 C) (12/14 0728) Temp Source: Oral (12/14 0728) BP: 111/73 (12/14 0728) Pulse Rate: 68 (12/14 0728)  Labs: Recent Labs    02/22/23 0346 02/23/23 0431 02/24/23 0314  HGB 11.6* 11.4* 11.3*  HCT 36.5* 35.9* 35.3*  PLT 323 336 357  HEPARINUNFRC 0.66 0.51 0.53  CREATININE 0.87 0.73 0.75    Estimated Creatinine Clearance: 60 mL/min (by C-G formula based on SCr of 0.75 mg/dL).   Medical History: Past Medical History:  Diagnosis Date   Essential hypertension    HFrEF (heart failure with reduced ejection fraction) (HCC)    Mitral regurgitation     Assessment: Patient is a 77 y.o. male who presented to the ED on 02/10/2023 with SOB and chest pain. PMH includes HFrEF with EF < 20 RV mod reduced and mildly enlarged, mod pulm artery pressure, LA mod dilated, RA mildly dilated, mod pleural effusion, severe MVR. On admission BNP > 4,500 and troponins 13,606. 12/3 taken for Right/Left heart cath and coronary angiography. Showed severe three-vessel disease. Post procedure recommends CABG +/- MVR. Pharmacy consulted to dose and manage IV heparin until OR. Noted plans for CABG on 12/19.   Heparin level is therapeutic at 0.53 on 1350 units/hr. CBC is stable. No issues with infusion and no new bleeding noted per RN.    Goal of Therapy:  Heparin level 0.3-0.7 units/ml Monitor platelets by anticoagulation protocol: Yes   Plan:  Continue heparin 1350 units/h Daily heparin level and CBC  Lennie Muckle, PharmD PGY1 Pharmacy Resident 02/24/2023 7:57 AM

## 2023-02-25 DIAGNOSIS — I5023 Acute on chronic systolic (congestive) heart failure: Secondary | ICD-10-CM | POA: Diagnosis not present

## 2023-02-25 LAB — CBC
HCT: 34.4 % — ABNORMAL LOW (ref 39.0–52.0)
Hemoglobin: 11 g/dL — ABNORMAL LOW (ref 13.0–17.0)
MCH: 27 pg (ref 26.0–34.0)
MCHC: 32 g/dL (ref 30.0–36.0)
MCV: 84.5 fL (ref 80.0–100.0)
Platelets: 318 10*3/uL (ref 150–400)
RBC: 4.07 MIL/uL — ABNORMAL LOW (ref 4.22–5.81)
RDW: 20 % — ABNORMAL HIGH (ref 11.5–15.5)
WBC: 7.1 10*3/uL (ref 4.0–10.5)
nRBC: 0 % (ref 0.0–0.2)

## 2023-02-25 LAB — HEPARIN LEVEL (UNFRACTIONATED): Heparin Unfractionated: 0.44 [IU]/mL (ref 0.30–0.70)

## 2023-02-25 MED ORDER — ATORVASTATIN CALCIUM 80 MG PO TABS
80.0000 mg | ORAL_TABLET | Freq: Every day | ORAL | Status: DC
  Administered 2023-02-25 – 2023-02-27 (×3): 80 mg via ORAL
  Filled 2023-02-25 (×5): qty 1

## 2023-02-25 NOTE — Plan of Care (Signed)
Problem: Health Behavior/Discharge Planning: Goal: Ability to manage health-related needs will improve Outcome: Progressing   Problem: Clinical Measurements: Goal: Ability to maintain clinical measurements within normal limits will improve Outcome: Progressing Goal: Will remain free from infection Outcome: Progressing Goal: Diagnostic test results will improve Outcome: Progressing Goal: Respiratory complications will improve Outcome: Progressing Goal: Cardiovascular complication will be avoided Outcome: Progressing   Problem: Activity: Goal: Risk for activity intolerance will decrease Outcome: Progressing   Problem: Coping: Goal: Level of anxiety will decrease Outcome: Progressing   Problem: Elimination: Goal: Will not experience complications related to bowel motility Outcome: Progressing Goal: Will not experience complications related to urinary retention Outcome: Progressing   Problem: Pain Management: Goal: General experience of comfort will improve Outcome: Progressing   Problem: Safety: Goal: Ability to remain free from injury will improve Outcome: Progressing   Problem: Skin Integrity: Goal: Risk for impaired skin integrity will decrease Outcome: Progressing   Problem: Education: Goal: Knowledge of disease or condition will improve Outcome: Progressing Goal: Understanding of medication regimen will improve Outcome: Progressing Goal: Individualized Educational Video(s) Outcome: Progressing   Problem: Activity: Goal: Ability to tolerate increased activity will improve Outcome: Progressing   Problem: Cardiac: Goal: Ability to achieve and maintain adequate cardiopulmonary perfusion will improve Outcome: Progressing   Problem: Health Behavior/Discharge Planning: Goal: Ability to safely manage health-related needs after discharge will improve Outcome: Progressing   Problem: Education: Goal: Will demonstrate proper wound care and an understanding of  methods to prevent future damage Outcome: Progressing Goal: Knowledge of disease or condition will improve Outcome: Progressing Goal: Knowledge of the prescribed therapeutic regimen will improve Outcome: Progressing Goal: Individualized Educational Video(s) Outcome: Progressing   Problem: Activity: Goal: Risk for activity intolerance will decrease Outcome: Progressing   Problem: Cardiac: Goal: Will achieve and/or maintain hemodynamic stability Outcome: Progressing   Problem: Clinical Measurements: Goal: Postoperative complications will be avoided or minimized Outcome: Progressing   Problem: Respiratory: Goal: Respiratory status will improve Outcome: Progressing   Problem: Skin Integrity: Goal: Wound healing without signs and symptoms of infection Outcome: Progressing Goal: Risk for impaired skin integrity will decrease Outcome: Progressing   Problem: Urinary Elimination: Goal: Ability to achieve and maintain adequate renal perfusion and functioning will improve Outcome: Progressing   Problem: Education: Goal: Knowledge of General Education information will improve Description: Including pain rating scale, medication(s)/side effects and non-pharmacologic comfort measures Outcome: Progressing   Problem: Health Behavior/Discharge Planning: Goal: Ability to manage health-related needs will improve Outcome: Progressing   Problem: Clinical Measurements: Goal: Ability to maintain clinical measurements within normal limits will improve Outcome: Progressing Goal: Will remain free from infection Outcome: Progressing Goal: Diagnostic test results will improve Outcome: Progressing Goal: Respiratory complications will improve Outcome: Progressing Goal: Cardiovascular complication will be avoided Outcome: Progressing   Problem: Activity: Goal: Risk for activity intolerance will decrease Outcome: Progressing   Problem: Nutrition: Goal: Adequate nutrition will be  maintained Outcome: Progressing   Problem: Coping: Goal: Level of anxiety will decrease Outcome: Progressing   Problem: Elimination: Goal: Will not experience complications related to bowel motility Outcome: Progressing Goal: Will not experience complications related to urinary retention Outcome: Progressing   Problem: Pain Management: Goal: General experience of comfort will improve Outcome: Progressing   Problem: Safety: Goal: Ability to remain free from injury will improve Outcome: Progressing   Problem: Skin Integrity: Goal: Risk for impaired skin integrity will decrease Outcome: Progressing   Problem: Education: Goal: Knowledge of risk factors and measures for prevention of condition will improve Outcome:  Progressing   Problem: Coping: Goal: Psychosocial and spiritual needs will be supported Outcome: Progressing   Problem: Respiratory: Goal: Will maintain a patent airway Outcome: Progressing Goal: Complications related to the disease process, condition or treatment will be avoided or minimized Outcome: Progressing   Problem: Education: Goal: Ability to demonstrate management of disease process will improve Outcome: Progressing Goal: Ability to verbalize understanding of medication therapies will improve Outcome: Progressing Goal: Individualized Educational Video(s) Outcome: Progressing   Problem: Activity: Goal: Capacity to carry out activities will improve Outcome: Progressing   Problem: Cardiac: Goal: Ability to achieve and maintain adequate cardiopulmonary perfusion will improve Outcome: Progressing

## 2023-02-25 NOTE — Progress Notes (Signed)
Mobility Specialist Progress Note:    02/25/23 1100  Mobility  Activity Ambulated with assistance in hallway  Level of Assistance Standby assist, set-up cues, supervision of patient - no hands on  Assistive Device Four wheel walker  Distance Ambulated (ft) 400 ft  Activity Response Tolerated well  Mobility Referral Yes  Mobility visit 1 Mobility  Mobility Specialist Start Time (ACUTE ONLY) 1107  Mobility Specialist Stop Time (ACUTE ONLY) 1130  Mobility Specialist Time Calculation (min) (ACUTE ONLY) 23 min   Received pt in bed having no complaints and agreeable to mobility. Pt was asymptomatic throughout ambulation and returned to room w/o fault. Left in chair w/ call bell in reach and all needs met. Chair alarm on, RN aware.   D'Vante Earlene Plater Mobility Specialist Please contact via Special educational needs teacher or Rehab office at [phone]

## 2023-02-25 NOTE — Progress Notes (Signed)
PHARMACY - ANTICOAGULATION CONSULT NOTE  Pharmacy Consult for Heparin Indication: NSTEMI, multi-vessel CAD  No Active Allergies   Patient Measurements: Height: 68"  Weight: 55.7 kg (initial weight 56.5 kg, repeated due to weight 63.7 kg at [facility]) - lasix 60 IV q12h x 4 doses given at [facility] 12/1>>12/3 IBW/kg: 68.4 kg Heparin Dosing Weight: 55.7 kg   Vital Signs: Temp: 97.9 F (36.6 C) (12/15 0235) Temp Source: Oral (12/15 0235) BP: 114/71 (12/15 0235) Pulse Rate: 71 (12/15 0235)  Labs: Recent Labs    02/23/23 0431 02/24/23 0314 02/25/23 0529  HGB 11.4* 11.3* 11.0*  HCT 35.9* 35.3* 34.4*  PLT 336 357 318  HEPARINUNFRC 0.51 0.53 0.44  CREATININE 0.73 0.75  --     Estimated Creatinine Clearance: 60 mL/min (by C-G formula based on SCr of 0.75 mg/dL).   Medical History: Past Medical History:  Diagnosis Date   Essential hypertension    HFrEF (heart failure with reduced ejection fraction) (HCC)    Mitral regurgitation     Assessment: Patient is a 77 y.o. male who presented to the ED on 02/10/2023 with SOB and chest pain. PMH includes HFrEF with EF < 20 RV mod reduced and mildly enlarged, mod pulm artery pressure, LA mod dilated, RA mildly dilated, mod pleural effusion, severe MVR. On admission BNP > 4,500 and troponins 13,606. 12/3 taken for Right/Left heart cath and coronary angiography. Showed severe three-vessel disease. Post procedure recommends CABG +/- MVR. Pharmacy consulted to dose and manage IV heparin until OR. Noted plans for CABG on 12/19.   Heparin level is therapeutic at 0.44 on 1350 units/hr. CBC is stable. RN does report that patient is allowing pump to beep and not letting nursing staff know, so infusion does pause for undetermined lengths of time. Heparin level is therapeutic, so will not make any adjustments but will keep close eye on heparin level. No new bleeding reported per RN.   Goal of Therapy:  Heparin level 0.3-0.7 units/ml Monitor platelets by  anticoagulation protocol: Yes   Plan:  Continue heparin infusion at 1350 units/h Daily heparin level and CBC Monitor for new s/sx of bleeding   Lennie Muckle, PharmD PGY1 Pharmacy Resident 02/25/2023 8:00 AM

## 2023-02-25 NOTE — Progress Notes (Signed)
   Rounding Note    Patient Name: TREVONTA BRISKY Date of Encounter: 02/25/2023  [facility] Cardiologist: None   Subjective   NAEO.  Vital Signs    Vitals:   02/24/23 1120 02/24/23 1632 02/24/23 1914 02/25/23 0235  BP:  109/78 98/62 114/71  Pulse:   67 71  Resp:  17 16 16   Temp:  98.6 F (37 C) 98.4 F (36.9 C) 97.9 F (36.6 C)  TempSrc:  Oral Oral Oral  SpO2: 94% 100% 99% 97%  Weight:    54.9 kg  Height:        Intake/Output Summary (Last 24 hours) at 02/25/2023 0946 Last data filed at 02/25/2023 8657 Gross per 24 hour  Intake 1278.03 ml  Output 1000 ml  Net 278.03 ml      02/25/2023    2:35 AM 02/24/2023    3:07 AM 02/23/2023    4:30 AM  Last 3 Weights  Weight (lbs) 121 lb 121 lb 122 lb 3.2 oz  Weight (kg) 54.885 kg 54.885 kg 55.43 kg      Telemetry    Personally Reviewed  ECG    Personally Reviewed  Physical Exam   GEN: No acute distress.  Respiratory: No IWOB Psych: Normal affect   Assessment & Plan    #CAD CABG planned for Thursday Cont heparin gtt Cont aspirin  #COVID Treated  #CAS [facility] outpatient follow up  #Acute on chronic systolic heart failure NYHA II today. Cont lasix, digoxin, spiro, metop      Rally Ouch T. Lalla Brothers, MD, [facility], [facility] Cardiac Electrophysiology

## 2023-02-26 DIAGNOSIS — I2510 Atherosclerotic heart disease of native coronary artery without angina pectoris: Secondary | ICD-10-CM

## 2023-02-26 LAB — HEPARIN LEVEL (UNFRACTIONATED): Heparin Unfractionated: 0.43 [IU]/mL (ref 0.30–0.70)

## 2023-02-26 LAB — CBC
HCT: 35.7 % — ABNORMAL LOW (ref 39.0–52.0)
Hemoglobin: 11.3 g/dL — ABNORMAL LOW (ref 13.0–17.0)
MCH: 26.9 pg (ref 26.0–34.0)
MCHC: 31.7 g/dL (ref 30.0–36.0)
MCV: 85 fL (ref 80.0–100.0)
Platelets: 340 10*3/uL (ref 150–400)
RBC: 4.2 MIL/uL — ABNORMAL LOW (ref 4.22–5.81)
RDW: 20.2 % — ABNORMAL HIGH (ref 11.5–15.5)
WBC: 7.6 10*3/uL (ref 4.0–10.5)
nRBC: 0 % (ref 0.0–0.2)

## 2023-02-26 LAB — BASIC METABOLIC PANEL
Anion gap: 12 (ref 5–15)
BUN: 16 mg/dL (ref 8–23)
CO2: 24 mmol/L (ref 22–32)
Calcium: 9.6 mg/dL (ref 8.9–10.3)
Chloride: 99 mmol/L (ref 98–111)
Creatinine, Ser: 0.71 mg/dL (ref 0.61–1.24)
GFR, Estimated: >60 mL/min (ref >60)
Glucose, Bld: 112 mg/dL — ABNORMAL HIGH (ref 70–99)
Potassium: 4.7 mmol/L (ref 3.5–5.1)
Sodium: 135 mmol/L (ref 135–145)

## 2023-02-26 NOTE — Progress Notes (Signed)
Physical Therapy Treatment Patient Details Name: Walter Vargas MRN: [id number] DOB: 1945/10/13 Today's Date: 02/26/2023   History of Present Illness 77 y.o. male admitted 02/13/22 with NSTEMI, CHF exacerbation. LHC with 3-vessel CAD. Plan for CABG with possible MVR but pt covid + so surgery delayed until possibly 12/19. Pt with fall in room on 12/9. PMH includes HTN, HF, mod/severe MR.    PT Comments  Patient is agreeable to PT session. He remains mildly impulsive with mobility and needs safety cues throughout session. Patient ambulated using 4 wheeled walker without loss of balance, cues to decrease walking speed for safety. No dyspnea with exertion noted. Patient did require increase physical assistance with bed mobility and transfers when simulating sternal precautions in preparation for upcoming CABG surgery. Patient will need re-evaluation of mobility following surgery to determine further therapy needs. PT will continue to follow.    If plan is discharge home, recommend the following: Assistance with cooking/housework;Direct supervision/assist for medications management;Direct supervision/assist for financial management;Assist for transportation;Supervision due to cognitive status   Can travel by private vehicle        Equipment Recommendations  Rollator (4 wheels)    Recommendations for Other Services       Precautions / Restrictions Precautions Precautions: Fall Restrictions Weight Bearing Restrictions Per Provider Order: No     Mobility  Bed Mobility Overal bed mobility: Modified Independent, Needs Assistance             General bed mobility comments: Mod I for supine to short sitting. When returning to bed, had patient simulate sternal precuations- he required increased cues and minimal assistance for LE support.    Transfers Overall transfer level: Needs assistance Equipment used: Rolling walker (2 wheels) Transfers: Sit to/from Stand Sit to Stand: Contact guard  assist           General transfer comment: patient relying on UE support to stand. he needs assistance for standing without UE support (to simulate sternal precuations in preparation for upcoming CABG surgery)    Ambulation/Gait Ambulation/Gait assistance: Supervision Gait Distance (Feet): 300 Feet Assistive device: Rollator (4 wheels) Gait Pattern/deviations: Step-through pattern Gait velocity: normal     General Gait Details: no loss of balance with ambulation. occasional safety cues to slow walking speed for safety. no dyspnea noted with exertion. heart rate in the 80's-90's after walking.   Stairs             Wheelchair Mobility     Tilt Bed    Modified Rankin (Stroke Patients Only)       Balance Overall balance assessment: Needs assistance Sitting-balance support: No upper extremity supported, Feet supported Sitting balance-Leahy Scale: Good     Standing balance support: No upper extremity supported, During functional activity Standing balance-Leahy Scale: Fair                              Cognition Arousal: Alert Behavior During Therapy: Restless Overall Cognitive Status: History of cognitive impairments - at baseline                                 General Comments: patient is impulsive at times and needs safety cues with mobility        Exercises      General Comments        Pertinent Vitals/Pain Pain Assessment Pain Assessment: No/denies pain  Home Living                          Prior Function            PT Goals (current goals can now be found in the care plan section) Acute Rehab PT Goals Patient Stated Goal: return home PT Goal Formulation: With patient/family Time For Goal Achievement: 03/03/23 Potential to Achieve Goals: Good Progress towards PT goals: Progressing toward goals    Frequency    Min 1X/week      PT Plan      Co-evaluation              AM-PAC PT "6  Clicks" Mobility   Outcome Measure  Help needed turning from your back to your side while in a flat bed without using bedrails?: None Help needed moving from lying on your back to sitting on the side of a flat bed without using bedrails?: None Help needed moving to and from a bed to a chair (including a wheelchair)?: A Little Help needed standing up from a chair using your arms (e.g., wheelchair or bedside chair)?: A Little Help needed to walk in hospital room?: A Little Help needed climbing 3-5 steps with a railing? : A Little 6 Click Score: 20    End of Session   Activity Tolerance: Patient tolerated treatment well Patient left: in bed;with call bell/phone within reach;with bed alarm set Nurse Communication: Mobility status PT Visit Diagnosis: Other abnormalities of gait and mobility (R26.89)     Time: 5732-2025 PT Time Calculation (min) (ACUTE ONLY): 19 min  Charges:    $Therapeutic Activity: 8-22 mins PT General Charges $$ ACUTE PT VISIT: 1 Visit                     Donna Bernard, PT, MPT    Ina Homes 02/26/2023, 1:22 PM

## 2023-02-26 NOTE — Plan of Care (Signed)
Problem: Health Behavior/Discharge Planning: Goal: Ability to manage health-related needs will improve Outcome: Progressing   Problem: Clinical Measurements: Goal: Ability to maintain clinical measurements within normal limits will improve Outcome: Progressing Goal: Will remain free from infection Outcome: Progressing Goal: Diagnostic test results will improve Outcome: Progressing Goal: Respiratory complications will improve Outcome: Progressing Goal: Cardiovascular complication will be avoided Outcome: Progressing   Problem: Activity: Goal: Risk for activity intolerance will decrease Outcome: Progressing   Problem: Coping: Goal: Level of anxiety will decrease Outcome: Progressing   Problem: Elimination: Goal: Will not experience complications related to bowel motility Outcome: Progressing Goal: Will not experience complications related to urinary retention Outcome: Progressing   Problem: Pain Management: Goal: General experience of comfort will improve Outcome: Progressing   Problem: Safety: Goal: Ability to remain free from injury will improve Outcome: Progressing   Problem: Skin Integrity: Goal: Risk for impaired skin integrity will decrease Outcome: Progressing   Problem: Education: Goal: Knowledge of disease or condition will improve Outcome: Progressing Goal: Understanding of medication regimen will improve Outcome: Progressing Goal: Individualized Educational Video(s) Outcome: Progressing   Problem: Activity: Goal: Ability to tolerate increased activity will improve Outcome: Progressing   Problem: Cardiac: Goal: Ability to achieve and maintain adequate cardiopulmonary perfusion will improve Outcome: Progressing   Problem: Health Behavior/Discharge Planning: Goal: Ability to safely manage health-related needs after discharge will improve Outcome: Progressing   Problem: Education: Goal: Will demonstrate proper wound care and an understanding of  methods to prevent future damage Outcome: Progressing Goal: Knowledge of disease or condition will improve Outcome: Progressing Goal: Knowledge of the prescribed therapeutic regimen will improve Outcome: Progressing Goal: Individualized Educational Video(s) Outcome: Progressing   Problem: Activity: Goal: Risk for activity intolerance will decrease Outcome: Progressing   Problem: Cardiac: Goal: Will achieve and/or maintain hemodynamic stability Outcome: Progressing   Problem: Clinical Measurements: Goal: Postoperative complications will be avoided or minimized Outcome: Progressing   Problem: Respiratory: Goal: Respiratory status will improve Outcome: Progressing   Problem: Skin Integrity: Goal: Wound healing without signs and symptoms of infection Outcome: Progressing Goal: Risk for impaired skin integrity will decrease Outcome: Progressing   Problem: Urinary Elimination: Goal: Ability to achieve and maintain adequate renal perfusion and functioning will improve Outcome: Progressing   Problem: Education: Goal: Knowledge of General Education information will improve Description: Including pain rating scale, medication(s)/side effects and non-pharmacologic comfort measures Outcome: Progressing   Problem: Health Behavior/Discharge Planning: Goal: Ability to manage health-related needs will improve Outcome: Progressing   Problem: Clinical Measurements: Goal: Ability to maintain clinical measurements within normal limits will improve Outcome: Progressing Goal: Will remain free from infection Outcome: Progressing Goal: Diagnostic test results will improve Outcome: Progressing Goal: Respiratory complications will improve Outcome: Progressing Goal: Cardiovascular complication will be avoided Outcome: Progressing   Problem: Activity: Goal: Risk for activity intolerance will decrease Outcome: Progressing   Problem: Nutrition: Goal: Adequate nutrition will be  maintained Outcome: Progressing   Problem: Coping: Goal: Level of anxiety will decrease Outcome: Progressing   Problem: Elimination: Goal: Will not experience complications related to bowel motility Outcome: Progressing Goal: Will not experience complications related to urinary retention Outcome: Progressing   Problem: Pain Management: Goal: General experience of comfort will improve Outcome: Progressing   Problem: Safety: Goal: Ability to remain free from injury will improve Outcome: Progressing   Problem: Skin Integrity: Goal: Risk for impaired skin integrity will decrease Outcome: Progressing   Problem: Education: Goal: Knowledge of risk factors and measures for prevention of condition will improve Outcome:  Progressing   Problem: Coping: Goal: Psychosocial and spiritual needs will be supported Outcome: Progressing   Problem: Respiratory: Goal: Will maintain a patent airway Outcome: Progressing Goal: Complications related to the disease process, condition or treatment will be avoided or minimized Outcome: Progressing   Problem: Education: Goal: Ability to demonstrate management of disease process will improve Outcome: Progressing Goal: Ability to verbalize understanding of medication therapies will improve Outcome: Progressing Goal: Individualized Educational Video(s) Outcome: Progressing   Problem: Activity: Goal: Capacity to carry out activities will improve Outcome: Progressing   Problem: Cardiac: Goal: Ability to achieve and maintain adequate cardiopulmonary perfusion will improve Outcome: Progressing

## 2023-02-26 NOTE — Progress Notes (Signed)
Advanced Heart Failure Rounding Note  PCP-Cardiologist: None   Subjective:   Chief Complaint: Acute on Chronic Systolic Heart Failure  CABG Thursday COVID treated.  Feeling well this morning. Ambulating around room. No CP, SOB, or dyspnea.  Objective:   Weight Range: 55.1 kg Body mass index is 18.47 kg/m.   Vital Signs:   Temp:  [98.2 F (36.8 C)-98.3 F (36.8 C)] 98.2 F (36.8 C) (12/16 0414) Pulse Rate:  [70-75] 75 (12/16 0414) Resp:  [17-20] 20 (12/16 0414) BP: (108-125)/(67-90) 114/77 (12/16 0414) SpO2:  [96 %-98 %] 98 % (12/16 0414) Weight:  [55.1 kg] 55.1 kg (12/16 0414) Last BM Date : 02/24/23  Weight change: Filed Weights   02/24/23 0307 02/25/23 0235 02/26/23 0414  Weight: 54.9 kg 54.9 kg 55.1 kg    Intake/Output:   Intake/Output Summary (Last 24 hours) at 02/26/2023 0920 Last data filed at 02/26/2023 0654 Gross per 24 hour  Intake 120 ml  Output 1025 ml  Net -905 ml    Physical Exam   General: Thin and frail appearing. No distress on RA HEENT: neck supple.   Cardiac: JVP ~8cm. S1 and S2 present. No murmurs or rub. Resp: Lung sounds clear and equal B/L Abdomen: Soft, non-tender, non-distended. + BS. Extremities: Warm and dry. No rash, cyanosis.  No edema.  Neuro: Alert and oriented x3. Impulsive and forgetful. Moves all extremities without difficulty.  Telemetry   SR 70-80s w frequent PACs  (personally reviewed)  Labs    CBC Recent Labs    02/24/23 0314 02/25/23 0529  WBC 8.0 7.1  HGB 11.3* 11.0*  HCT 35.3* 34.4*  MCV 83.5 84.5  PLT 357 318   Basic Metabolic Panel Recent Labs    16/10/96 0314  NA 134*  K 4.5  CL 102  CO2 25  GLUCOSE 99  BUN 14  CREATININE 0.75  CALCIUM 9.3   Liver Function Tests No results for input(s): "AST", "ALT", "ALKPHOS", "BILITOT", "PROT", "ALBUMIN" in the last 72 hours.  No results for input(s): "LIPASE", "AMYLASE" in the last 72 hours. Cardiac Enzymes No results for input(s): "CKTOTAL",  "CKMB", "CKMBINDEX", "TROPONINI" in the last 72 hours.  BNP: BNP (last 3 results) Recent Labs    02/10/23 1120  BNP >4,500.0*   ProBNP (last 3 results) Recent Labs    10/12/22 1030 10/19/22 0852 01/08/23 0957  PROBNP 2,878.0* 2,297.0* >4978.0*   D-Dimer No results for input(s): "DDIMER" in the last 72 hours. Hemoglobin A1C No results for input(s): "HGBA1C" in the last 72 hours.  Fasting Lipid Panel No results for input(s): "CHOL", "HDL", "LDLCALC", "TRIG", "CHOLHDL", "LDLDIRECT" in the last 72 hours. Thyroid Function Tests No results for input(s): "TSH", "T4TOTAL", "T3FREE", "THYROIDAB" in the last 72 hours.  Invalid input(s): "FREET3"  Other results:  Imaging   No results found.  Medications:    Scheduled Medications:  aspirin  81 mg Oral Daily   atorvastatin  80 mg Oral Daily   Chlorhexidine Gluconate Cloth  6 each Topical Once   And   Chlorhexidine Gluconate Cloth  6 each Topical Once   digoxin  0.125 mg Oral Daily   furosemide  20 mg Oral Daily   metoprolol succinate  12.5 mg Oral Daily   polyethylene glycol  17 g Oral Daily   senna-docusate  2 tablet Oral QHS   sodium chloride flush  3 mL Intravenous Q12H   spironolactone  25 mg Oral Daily    Infusions:  heparin 1,350 Units/hr (02/25/23 1241)  PRN Medications: acetaminophen, ondansetron (ZOFRAN) IV, sodium chloride flush  Patient Profile   Walter Vargas is a 77 y.o. history of HTN, Walter, HFrEF, and severe 3V CAD.  Transferred to [facility] for CT surgery consultation for possible CABG.   Assessment/Plan   1. CAD: Patient admitted with NSTEMI (?culprit D2) but with severe extensive diffuse 3 vessel disease.  Disease too extensive to treat percutaneously.  Anatomy would be best served by CABG, but CABG would be moderate-high risk given cardiomyopathy.  Echo 12/4 showed mild-moderate RV dysfunction and PAPi was adequate on RHC, so do not think RV function will be a limitation.   - We could obtain a viability  study, but LV walls are not severely thinned and presence/absence of viability has not been shown to have a significant impact on outcome for CABG in heart failure - High risk CABG now postponed to 12/19 d/t COVID +. Completed Treatment.  - No chest pain.  - On heparin gtt. - Continue ASA 81 - Held statin w/ mildly elevated LFTs while being treated w/ Plaxlovid. Will need to restart prior to discharge.  2. Acute on chronic systolic CHF: Patient has had HF symptoms for at least a couple of months.  Suspect pre-existing ischemic cardiomyopathy then he presented this admission with NSTEMI possible in D2 territory leading to CHF exacerbation. He has been diuresed, RHC 12/3 showed normal filling pressures and low CI at 2.0. Echo 12/4: EF 20%, RV mild-moderately dysfunctional, mild-moderate Walter. Suspect patient's best chance to improve LV function will be CABG.  - Volume status appears stable.   - Continue Lasix 20 PO daily - Continue Digoxin 0.125 daily.  - Continue Spironolactone 25 daily.  - Continue Metoprolol 12.5 daily - Hold on adding SGLT2i and ARB/ARNi until post CABG  - Renal function stable   3. 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  Patient has had on and off confusion in hospital.  - He would be at risk for worsening post-op delirium in setting of CABG.    4. Gallbladder disease: Patient had gallstones but HIDA scan not suggestive of cholecystitis.  5. COVID+: Completed Paxlovid  6. Fall: Had inpatient fall. No injuries. Did not hit his head.   7. 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  He cannot go home with family pre-CABG, planning to stay here until surgery. Family confirming post-op plan. Patient states he has family in [place] and [place] but is not aware of a confirmed plan.   We will reconnect with his son today to make sure  there is a family plan to help him with care after CABG and SNF stay.  Length of Stay: 20  [place] Ivi Griffith, NP  02/26/2023, 9:20 AM  Advanced Heart Failure Team Pager [phone] (M-F; 7a - 5p)  Please contact [facility] for night-coverage after hours (5p -7a ) and weekends on [email or web address]

## 2023-02-26 NOTE — Progress Notes (Signed)
PHARMACY - ANTICOAGULATION CONSULT NOTE  Pharmacy Consult for Heparin Indication: NSTEMI, multi-vessel CAD  No Active Allergies   Patient Measurements: Height: 68"  Weight: 55.7 kg (initial weight 56.5 kg, repeated due to weight 63.7 kg at [facility]) - lasix 60 IV q12h x 4 doses given at [facility] 12/1>>12/3 IBW/kg: 68.4 kg Heparin Dosing Weight: 55.7 kg   Vital Signs: Temp: 98.2 F (36.8 C) (12/16 0414) Temp Source: Oral (12/16 0414) BP: 114/77 (12/16 0414) Pulse Rate: 75 (12/16 0414)  Labs: Recent Labs    02/24/23 0314 02/25/23 0529 02/26/23 0438 02/26/23 1005  HGB 11.3* 11.0*  --  11.3*  HCT 35.3* 34.4*  --  35.7*  PLT 357 318  --  340  HEPARINUNFRC 0.53 0.44 0.43  --   CREATININE 0.75  --   --   --     Estimated Creatinine Clearance: 60.3 mL/min (by C-G formula based on SCr of 0.75 mg/dL).   Medical History: Past Medical History:  Diagnosis Date   Essential hypertension    HFrEF (heart failure with reduced ejection fraction) (HCC)    Mitral regurgitation     Assessment: Patient is a 77 y.o. male who presented to the ED on 02/10/2023 with SOB and chest pain. PMH includes HFrEF with EF < 20 RV mod reduced and mildly enlarged, mod pulm artery pressure, LA mod dilated, RA mildly dilated, mod pleural effusion, severe MVR. On admission BNP > 4,500 and troponins 13,606. 12/3 taken for Right/Left heart cath and coronary angiography. Showed severe three-vessel disease. Post procedure recommends CABG +/- MVR. Pharmacy consulted to dose and manage IV heparin until OR. Noted plans for CABG on 12/19.   Heparin level is therapeutic at 0.43 on heparin drip rate 1350 units/hr. CBC is stable. RN does report that patient is allowing pump to beep and not letting nursing staff know, so infusion does pause for undetermined lengths of time. Heparin level is therapeutic, so will not make any adjustments but will keep close eye on heparin level. No new bleeding reported per RN.   Meds stable  ready for CABG  Goal of Therapy:  Heparin level 0.3-0.7 units/ml Monitor platelets by anticoagulation protocol: Yes   Plan:  Continue heparin infusion at 1350 units/h Daily heparin level and CBC Monitor for new s/sx of bleeding     Leota Sauers Pharm.D. CPP, BCPS Clinical Pharmacist [phone] 02/26/2023 10:41 AM

## 2023-02-27 LAB — CBC
HCT: 35.1 % — ABNORMAL LOW (ref 39.0–52.0)
Hemoglobin: 11.2 g/dL — ABNORMAL LOW (ref 13.0–17.0)
MCH: 27 pg (ref 26.0–34.0)
MCHC: 31.9 g/dL (ref 30.0–36.0)
MCV: 84.6 fL (ref 80.0–100.0)
Platelets: 338 10*3/uL (ref 150–400)
RBC: 4.15 MIL/uL — ABNORMAL LOW (ref 4.22–5.81)
RDW: 20.2 % — ABNORMAL HIGH (ref 11.5–15.5)
WBC: 7.4 10*3/uL (ref 4.0–10.5)
nRBC: 0 % (ref 0.0–0.2)

## 2023-02-27 LAB — BASIC METABOLIC PANEL
Anion gap: 8 (ref 5–15)
BUN: 19 mg/dL (ref 8–23)
CO2: 25 mmol/L (ref 22–32)
Calcium: 9.2 mg/dL (ref 8.9–10.3)
Chloride: 100 mmol/L (ref 98–111)
Creatinine, Ser: 0.79 mg/dL (ref 0.61–1.24)
GFR, Estimated: >60 mL/min (ref >60)
Glucose, Bld: 94 mg/dL (ref 70–99)
Potassium: 4.6 mmol/L (ref 3.5–5.1)
Sodium: 133 mmol/L — ABNORMAL LOW (ref 135–145)

## 2023-02-27 LAB — HEPARIN LEVEL (UNFRACTIONATED): Heparin Unfractionated: 0.55 [IU]/mL (ref 0.30–0.70)

## 2023-02-27 NOTE — Progress Notes (Signed)
Mobility Specialist Progress Note:    02/27/23 1000  Mobility  Activity Ambulated with assistance in hallway  Level of Assistance Standby assist, set-up cues, supervision of patient - no hands on  Assistive Device Four wheel walker  Distance Ambulated (ft) 400 ft  Activity Response Tolerated well  Mobility Referral Yes  Mobility visit 1 Mobility  Mobility Specialist Start Time (ACUTE ONLY) 0956  Mobility Specialist Stop Time (ACUTE ONLY) 1012  Mobility Specialist Time Calculation (min) (ACUTE ONLY) 16 min   Pt received in bed, agreeable to mobility.Asymptomatic w/ no complaints throughout. Pt left in bed with call bell and all needs met. Bed alarm on.  [place] Earlene Plater Mobility Specialist Please contact via Special educational needs teacher or Rehab office at [phone]

## 2023-02-27 NOTE — Plan of Care (Signed)
Problem: Health Behavior/Discharge Planning: Goal: Ability to manage health-related needs will improve Outcome: Progressing   Problem: Clinical Measurements: Goal: Ability to maintain clinical measurements within normal limits will improve Outcome: Progressing Goal: Will remain free from infection Outcome: Progressing Goal: Diagnostic test results will improve Outcome: Progressing Goal: Respiratory complications will improve Outcome: Progressing Goal: Cardiovascular complication will be avoided Outcome: Progressing   Problem: Activity: Goal: Risk for activity intolerance will decrease Outcome: Progressing   Problem: Coping: Goal: Level of anxiety will decrease Outcome: Progressing   Problem: Elimination: Goal: Will not experience complications related to bowel motility Outcome: Progressing Goal: Will not experience complications related to urinary retention Outcome: Progressing   Problem: Pain Management: Goal: General experience of comfort will improve Outcome: Progressing   Problem: Safety: Goal: Ability to remain free from injury will improve Outcome: Progressing   Problem: Skin Integrity: Goal: Risk for impaired skin integrity will decrease Outcome: Progressing   Problem: Education: Goal: Knowledge of disease or condition will improve Outcome: Progressing Goal: Understanding of medication regimen will improve Outcome: Progressing Goal: Individualized Educational Video(s) Outcome: Progressing   Problem: Activity: Goal: Ability to tolerate increased activity will improve Outcome: Progressing   Problem: Cardiac: Goal: Ability to achieve and maintain adequate cardiopulmonary perfusion will improve Outcome: Progressing   Problem: Health Behavior/Discharge Planning: Goal: Ability to safely manage health-related needs after discharge will improve Outcome: Progressing   Problem: Education: Goal: Will demonstrate proper wound care and an understanding of  methods to prevent future damage Outcome: Progressing Goal: Knowledge of disease or condition will improve Outcome: Progressing Goal: Knowledge of the prescribed therapeutic regimen will improve Outcome: Progressing Goal: Individualized Educational Video(s) Outcome: Progressing   Problem: Activity: Goal: Risk for activity intolerance will decrease Outcome: Progressing   Problem: Cardiac: Goal: Will achieve and/or maintain hemodynamic stability Outcome: Progressing   Problem: Clinical Measurements: Goal: Postoperative complications will be avoided or minimized Outcome: Progressing   Problem: Respiratory: Goal: Respiratory status will improve Outcome: Progressing   Problem: Skin Integrity: Goal: Wound healing without signs and symptoms of infection Outcome: Progressing Goal: Risk for impaired skin integrity will decrease Outcome: Progressing   Problem: Urinary Elimination: Goal: Ability to achieve and maintain adequate renal perfusion and functioning will improve Outcome: Progressing   Problem: Education: Goal: Knowledge of General Education information will improve Description: Including pain rating scale, medication(s)/side effects and non-pharmacologic comfort measures Outcome: Progressing   Problem: Health Behavior/Discharge Planning: Goal: Ability to manage health-related needs will improve Outcome: Progressing   Problem: Clinical Measurements: Goal: Ability to maintain clinical measurements within normal limits will improve Outcome: Progressing Goal: Will remain free from infection Outcome: Progressing Goal: Diagnostic test results will improve Outcome: Progressing Goal: Respiratory complications will improve Outcome: Progressing Goal: Cardiovascular complication will be avoided Outcome: Progressing   Problem: Activity: Goal: Risk for activity intolerance will decrease Outcome: Progressing   Problem: Nutrition: Goal: Adequate nutrition will be  maintained Outcome: Progressing   Problem: Coping: Goal: Level of anxiety will decrease Outcome: Progressing   Problem: Elimination: Goal: Will not experience complications related to bowel motility Outcome: Progressing Goal: Will not experience complications related to urinary retention Outcome: Progressing   Problem: Pain Management: Goal: General experience of comfort will improve Outcome: Progressing   Problem: Safety: Goal: Ability to remain free from injury will improve Outcome: Progressing   Problem: Skin Integrity: Goal: Risk for impaired skin integrity will decrease Outcome: Progressing   Problem: Education: Goal: Knowledge of risk factors and measures for prevention of condition will improve Outcome:  Progressing

## 2023-02-27 NOTE — Progress Notes (Signed)
Advanced Heart Failure Rounding Note  PCP-Cardiologist: None   Subjective:    CABG scheduled for Thursday  Feeling antsy this morning. No CP, SOB. Ambulating in room and in halls.   Objective:   Weight Range: 54.4 kg Body mass index is 18.23 kg/m.   Vital Signs:   Temp:  [97.8 F (36.6 C)-98 F (36.7 C)] 98 F (36.7 C) (12/17 0339) Pulse Rate:  [66-76] 76 (12/17 0339) Resp:  [15-16] 16 (12/17 0339) BP: (109-121)/(63-82) 121/82 (12/17 0339) SpO2:  [100 %] 100 % (12/17 0339) Weight:  [54.4 kg] 54.4 kg (12/17 0339) Last BM Date : 02/24/23  Weight change: Filed Weights   02/25/23 0235 02/26/23 0414 02/27/23 0339  Weight: 54.9 kg 55.1 kg 54.4 kg    Intake/Output:   Intake/Output Summary (Last 24 hours) at 02/27/2023 0903 Last data filed at 02/27/2023 0500 Gross per 24 hour  Intake 653.36 ml  Output 1150 ml  Net -496.64 ml    Physical Exam   General: Frail appearing. No distress on RA HEENT: neck supple.   Cardiac: JVP ~6cm. S1 and S2 present. No murmurs or rub. Resp: Lung sounds clear and equal B/L Abdomen: Soft, non-tender, non-distended. + BS. Extremities: Warm and dry. No rash, cyanosis.  No edema.  Neuro: Alert and oriented x3. Affect pleasant. Moves all extremities without difficulty.  Telemetry   SR 70-80s w occasional PVCs  (personally reviewed)  Labs    CBC Recent Labs    02/26/23 1005 02/27/23 0403  WBC 7.6 7.4  HGB 11.3* 11.2*  HCT 35.7* 35.1*  MCV 85.0 84.6  PLT 340 338   Basic Metabolic Panel Recent Labs    02/72/53 1005 02/27/23 0403  NA 135 133*  K 4.7 4.6  CL 99 100  CO2 24 25  GLUCOSE 112* 94  BUN 16 19  CREATININE 0.71 0.79  CALCIUM 9.6 9.2   Liver Function Tests No results for input(s): "AST", "ALT", "ALKPHOS", "BILITOT", "PROT", "ALBUMIN" in the last 72 hours.  No results for input(s): "LIPASE", "AMYLASE" in the last 72 hours. Cardiac Enzymes No results for input(s): "CKTOTAL", "CKMB", "CKMBINDEX",  "TROPONINI" in the last 72 hours.  BNP: BNP (last 3 results) Recent Labs    02/10/23 1120  BNP >4,500.0*   ProBNP (last 3 results) Recent Labs    10/12/22 1030 10/19/22 0852 01/08/23 0957  PROBNP 2,878.0* 2,297.0* >4978.0*   D-Dimer No results for input(s): "DDIMER" in the last 72 hours. Hemoglobin A1C No results for input(s): "HGBA1C" in the last 72 hours.  Fasting Lipid Panel No results for input(s): "CHOL", "HDL", "LDLCALC", "TRIG", "CHOLHDL", "LDLDIRECT" in the last 72 hours. Thyroid Function Tests No results for input(s): "TSH", "T4TOTAL", "T3FREE", "THYROIDAB" in the last 72 hours.  Invalid input(s): "FREET3"  Other results:  Imaging   No results found.  Medications:    Scheduled Medications:  aspirin  81 mg Oral Daily   atorvastatin  80 mg Oral Daily   Chlorhexidine Gluconate Cloth  6 each Topical Once   And   Chlorhexidine Gluconate Cloth  6 each Topical Once   digoxin  0.125 mg Oral Daily   furosemide  20 mg Oral Daily   metoprolol succinate  12.5 mg Oral Daily   polyethylene glycol  17 g Oral Daily   senna-docusate  2 tablet Oral QHS   sodium chloride flush  3 mL Intravenous Q12H   spironolactone  25 mg Oral Daily    Infusions:  heparin 1,350 Units/hr (02/27/23 0448)  PRN Medications: acetaminophen, ondansetron (ZOFRAN) IV, sodium chloride flush  Patient Profile   Walter Vargas is a 77 y.o. history of HTN, Walter, HFrEF, and severe 3V CAD.  Transferred to [facility] for CT surgery consultation for possible CABG.   Assessment/Plan   1. CAD: Patient admitted with NSTEMI (?culprit D2) but with severe extensive diffuse 3 vessel disease.  Disease too extensive to treat percutaneously.  Anatomy would be best served by CABG, but CABG would be moderate-high risk given cardiomyopathy.  Echo 12/4 showed mild-moderate RV dysfunction and PAPi was adequate on RHC, so do not think RV function will be a limitation.   - We could obtain a viability study, but LV walls  are not severely thinned and presence/absence of viability has not been shown to have a significant impact on outcome for CABG in heart failure - High risk CABG now postponed to 12/19 d/t COVID +. Completed Treatment.  - No chest pain.  - On heparin gtt. - Continue ASA 81 - Held statin w/ mildly elevated LFTs while being treated w/ Plaxlovid. Will need to restart prior to discharge.  2. Acute on chronic systolic CHF: Patient has had HF symptoms for at least a couple of months.  Suspect pre-existing ischemic cardiomyopathy then he presented this admission with NSTEMI possible in D2 territory leading to CHF exacerbation. He has been diuresed, RHC 12/3 showed normal filling pressures and low CI at 2.0. Echo 12/4: EF 20%, RV mild-moderately dysfunctional, mild-moderate Walter. Suspect patient's best chance to improve LV function will be CABG.  - Volume status appears stable.   - Continue Lasix 20 PO daily - Continue Digoxin 0.125 daily.  - Continue Spironolactone 25 daily.  - Continue Metoprolol 12.5 daily - Hold on adding SGLT2i and ARB/ARNi until post CABG  - Renal function stable   3. 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  Patient has had on and off confusion in hospital.  - He would be at risk for worsening post-op delirium in setting of CABG.    4. Gallbladder disease: Patient had gallstones but HIDA scan not suggestive of cholecystitis.  5. COVID+: Completed Paxlovid  6. Fall: Had inpatient fall. No injuries. Did not hit his head.   7. 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  He cannot go home with family pre-CABG, planning to stay here until surgery. Family confirming post-op plan. Patient states he has family in [place] and [place] but is not aware of a confirmed plan.   Length of Stay: 22  Walter Brenlyn Beshara, NP  02/27/2023, 9:03 AM  Advanced  Heart Failure Team Pager [phone] (M-F; 7a - 5p)  Please contact [facility] for night-coverage after hours (5p -7a ) and weekends on [email or web address]

## 2023-02-27 NOTE — Progress Notes (Signed)
PHARMACY - ANTICOAGULATION CONSULT NOTE  Pharmacy Consult for Heparin Indication: NSTEMI, multi-vessel CAD  No Active Allergies   Patient Measurements: Height: 68"  Weight: 55.7 kg (initial weight 56.5 kg, repeated due to weight 63.7 kg at [facility]) - lasix 60 IV q12h x 4 doses given at [facility] 12/1>>12/3 IBW/kg: 68.4 kg Heparin Dosing Weight: 55.7 kg   Vital Signs: Temp: 98 F (36.7 C) (12/17 0339) Temp Source: Oral (12/17 0339) BP: 114/69 (12/17 0929) Pulse Rate: 68 (12/17 0929)  Labs: Recent Labs    02/25/23 0529 02/26/23 0438 02/26/23 1005 02/27/23 0403  HGB 11.0*  --  11.3* 11.2*  HCT 34.4*  --  35.7* 35.1*  PLT 318  --  340 338  HEPARINUNFRC 0.44 0.43  --  0.55  CREATININE  --   --  0.71 0.79    Estimated Creatinine Clearance: 59.5 mL/min (by C-G formula based on SCr of 0.79 mg/dL).   Medical History: Past Medical History:  Diagnosis Date   Essential hypertension    HFrEF (heart failure with reduced ejection fraction) (HCC)    Mitral regurgitation     Assessment: Patient is a 77 y.o. male who presented to the ED on 02/10/2023 with SOB and chest pain. PMH includes HFrEF with EF < 20 RV mod reduced and mildly enlarged, mod pulm artery pressure, LA mod dilated, RA mildly dilated, mod pleural effusion, severe MVR. On admission BNP > 4,500 and troponins 13,606. 12/3 taken for Right/Left heart cath and coronary angiography. Showed severe three-vessel disease. Post procedure recommends CABG +/- MVR. Pharmacy consulted to dose and manage IV heparin until OR. Noted plans for CABG on 12/19.   Heparin level is therapeutic at 0.55 on heparin drip rate 1350 units/hr.  CBC is stable.  No further infusion issues per RN.  Meds stable ready for CABG 12/19.  Goal of Therapy:  Heparin level 0.3-0.7 units/ml Monitor platelets by anticoagulation protocol: Yes   Plan:  Continue heparin infusion at 1350 units/h Daily heparin level and CBC Monitor for new s/sx of bleeding    Trixie Rude, PharmD Clinical Pharmacist 02/27/2023  10:18 AM

## 2023-02-27 NOTE — Plan of Care (Signed)
  Problem: Health Behavior/Discharge Planning: Goal: Ability to manage health-related needs will improve Outcome: Progressing   

## 2023-02-27 NOTE — Plan of Care (Signed)
  Problem: Clinical Measurements: Goal: Ability to maintain clinical measurements within normal limits will improve Outcome: Progressing Goal: Will remain free from infection Outcome: Progressing Goal: Respiratory complications will improve Outcome: Progressing Goal: Cardiovascular complication will be avoided Outcome: Progressing   Problem: Activity: Goal: Risk for activity intolerance will decrease Outcome: Progressing   Problem: Coping: Goal: Level of anxiety will decrease Outcome: Progressing   Problem: Elimination: Goal: Will not experience complications related to bowel motility Outcome: Progressing Goal: Will not experience complications related to urinary retention Outcome: Progressing   Problem: Pain Management: Goal: General experience of comfort will improve Outcome: Progressing   Problem: Safety: Goal: Ability to remain free from injury will improve Outcome: Progressing   Problem: Activity: Goal: Ability to tolerate increased activity will improve Outcome: Progressing   Problem: Cardiac: Goal: Ability to achieve and maintain adequate cardiopulmonary perfusion will improve Outcome: Progressing   Problem: Respiratory: Goal: Respiratory status will improve Outcome: Progressing   Problem: Clinical Measurements: Goal: Ability to maintain clinical measurements within normal limits will improve Outcome: Progressing Goal: Will remain free from infection Outcome: Progressing Goal: Respiratory complications will improve Outcome: Progressing   Problem: Activity: Goal: Risk for activity intolerance will decrease Outcome: Progressing   Problem: Pain Management: Goal: General experience of comfort will improve Outcome: Progressing   Problem: Safety: Goal: Ability to remain free from injury will improve Outcome: Progressing

## 2023-02-28 ENCOUNTER — Other Ambulatory Visit (HOSPITAL_COMMUNITY): Payer: 59

## 2023-02-28 ENCOUNTER — Inpatient Hospital Stay (HOSPITAL_COMMUNITY): Payer: 59

## 2023-02-28 DIAGNOSIS — I2510 Atherosclerotic heart disease of native coronary artery without angina pectoris: Secondary | ICD-10-CM

## 2023-02-28 DIAGNOSIS — I5022 Chronic systolic (congestive) heart failure: Secondary | ICD-10-CM

## 2023-02-28 LAB — COMPREHENSIVE METABOLIC PANEL
ALT: 15 U/L (ref 0–44)
AST: 18 U/L (ref 15–41)
Albumin: 3.4 g/dL — ABNORMAL LOW (ref 3.5–5.0)
Alkaline Phosphatase: 59 U/L (ref 38–126)
Anion gap: 12 (ref 5–15)
BUN: 22 mg/dL (ref 8–23)
CO2: 24 mmol/L (ref 22–32)
Calcium: 9.5 mg/dL (ref 8.9–10.3)
Chloride: 98 mmol/L (ref 98–111)
Creatinine, Ser: 0.84 mg/dL (ref 0.61–1.24)
GFR, Estimated: >60 mL/min (ref >60)
Glucose, Bld: 89 mg/dL (ref 70–99)
Potassium: 4.6 mmol/L (ref 3.5–5.1)
Sodium: 134 mmol/L — ABNORMAL LOW (ref 135–145)
Total Bilirubin: 0.6 mg/dL (ref <1.2)
Total Protein: 7.1 g/dL (ref 6.5–8.1)

## 2023-02-28 LAB — BLOOD GAS, ARTERIAL
Acid-Base Excess: 2.9 mmol/L — ABNORMAL HIGH (ref 0.0–2.0)
Bicarbonate: 25.9 mmol/L (ref 20.0–28.0)
Drawn by: 51185
O2 Saturation: 100 %
Patient temperature: 37
pCO2 arterial: 34 mm[Hg] (ref 32–48)
pH, Arterial: 7.49 — ABNORMAL HIGH (ref 7.35–7.45)
pO2, Arterial: 102 mm[Hg] (ref 83–108)

## 2023-02-28 LAB — APTT: aPTT: 83 s — ABNORMAL HIGH (ref 24–36)

## 2023-02-28 LAB — CBC
HCT: 35.1 % — ABNORMAL LOW (ref 39.0–52.0)
Hemoglobin: 11.2 g/dL — ABNORMAL LOW (ref 13.0–17.0)
MCH: 27 pg (ref 26.0–34.0)
MCHC: 31.9 g/dL (ref 30.0–36.0)
MCV: 84.6 fL (ref 80.0–100.0)
Platelets: 319 10*3/uL (ref 150–400)
RBC: 4.15 MIL/uL — ABNORMAL LOW (ref 4.22–5.81)
RDW: 20.6 % — ABNORMAL HIGH (ref 11.5–15.5)
WBC: 7 10*3/uL (ref 4.0–10.5)
nRBC: 0 % (ref 0.0–0.2)

## 2023-02-28 LAB — BASIC METABOLIC PANEL
Anion gap: 8 (ref 5–15)
BUN: 21 mg/dL (ref 8–23)
CO2: 25 mmol/L (ref 22–32)
Calcium: 9.1 mg/dL (ref 8.9–10.3)
Chloride: 100 mmol/L (ref 98–111)
Creatinine, Ser: 0.75 mg/dL (ref 0.61–1.24)
GFR, Estimated: >60 mL/min (ref >60)
Glucose, Bld: 88 mg/dL (ref 70–99)
Potassium: 4.6 mmol/L (ref 3.5–5.1)
Sodium: 133 mmol/L — ABNORMAL LOW (ref 135–145)

## 2023-02-28 LAB — URINALYSIS, ROUTINE W REFLEX MICROSCOPIC
Bilirubin Urine: NEGATIVE
Glucose, UA: NEGATIVE mg/dL
Hgb urine dipstick: NEGATIVE
Ketones, ur: NEGATIVE mg/dL
Leukocytes,Ua: NEGATIVE
Nitrite: NEGATIVE
Protein, ur: NEGATIVE mg/dL
Specific Gravity, Urine: 1.017 (ref 1.005–1.030)
pH: 5 (ref 5.0–8.0)

## 2023-02-28 LAB — HEMOGLOBIN A1C
Hgb A1c MFr Bld: 5.9 % — ABNORMAL HIGH (ref 4.8–5.6)
Mean Plasma Glucose: 122.63 mg/dL

## 2023-02-28 LAB — HEPARIN LEVEL (UNFRACTIONATED): Heparin Unfractionated: 0.46 [IU]/mL (ref 0.30–0.70)

## 2023-02-28 MED ORDER — NOREPINEPHRINE 4 MG/250ML-% IV SOLN
0.0000 ug/min | INTRAVENOUS | Status: AC
  Administered 2023-03-01: 2 ug/min via INTRAVENOUS
  Filled 2023-02-28: qty 250

## 2023-02-28 MED ORDER — BISACODYL 5 MG PO TBEC
5.0000 mg | DELAYED_RELEASE_TABLET | Freq: Once | ORAL | Status: AC
  Administered 2023-02-28: 5 mg via ORAL
  Filled 2023-02-28: qty 1

## 2023-02-28 MED ORDER — PHENYLEPHRINE HCL-NACL 20-0.9 MG/250ML-% IV SOLN
30.0000 ug/min | INTRAVENOUS | Status: AC
  Administered 2023-03-01: 30 ug/min via INTRAVENOUS
  Filled 2023-02-28: qty 250

## 2023-02-28 MED ORDER — CHLORHEXIDINE GLUCONATE CLOTH 2 % EX PADS
6.0000 | MEDICATED_PAD | Freq: Once | CUTANEOUS | Status: AC
Start: 2023-02-28 — End: 2023-02-28
  Administered 2023-02-28: 6 via TOPICAL

## 2023-02-28 MED ORDER — DEXMEDETOMIDINE HCL IN NACL 400 MCG/100ML IV SOLN
0.1000 ug/kg/h | INTRAVENOUS | Status: AC
  Administered 2023-03-01: .4 ug/kg/h via INTRAVENOUS
  Filled 2023-02-28: qty 100

## 2023-02-28 MED ORDER — TRANEXAMIC ACID (OHS) BOLUS VIA INFUSION
15.0000 mg/kg | INTRAVENOUS | Status: AC
  Administered 2023-03-01: 805.5 mg via INTRAVENOUS
  Filled 2023-02-28: qty 806

## 2023-02-28 MED ORDER — DIAZEPAM 2 MG PO TABS
2.0000 mg | ORAL_TABLET | Freq: Once | ORAL | Status: AC
  Administered 2023-03-01: 2 mg via ORAL
  Filled 2023-02-28: qty 1

## 2023-02-28 MED ORDER — VANCOMYCIN HCL IN DEXTROSE 1-5 GM/200ML-% IV SOLN
1000.0000 mg | INTRAVENOUS | Status: AC
  Administered 2023-03-01: 1000 mg via INTRAVENOUS
  Filled 2023-02-28: qty 200

## 2023-02-28 MED ORDER — INSULIN REGULAR(HUMAN) IN NACL 100-0.9 UT/100ML-% IV SOLN
INTRAVENOUS | Status: AC
  Administered 2023-03-01: .8 [IU]/h via INTRAVENOUS
  Filled 2023-02-28: qty 100

## 2023-02-28 MED ORDER — NITROGLYCERIN IN D5W 200-5 MCG/ML-% IV SOLN
2.0000 ug/min | INTRAVENOUS | Status: DC
  Filled 2023-02-28: qty 250

## 2023-02-28 MED ORDER — PLASMA-LYTE A IV SOLN
INTRAVENOUS | Status: DC
  Filled 2023-02-28: qty 5

## 2023-02-28 MED ORDER — HEPARIN 30,000 UNITS/1000 ML (OHS) CELLSAVER SOLUTION
Status: DC
  Filled 2023-02-28: qty 1000

## 2023-02-28 MED ORDER — EPINEPHRINE HCL 5 MG/250ML IV SOLN IN NS
0.0000 ug/min | INTRAVENOUS | Status: AC
  Administered 2023-03-01: 2 ug/min via INTRAVENOUS
  Filled 2023-02-28: qty 250

## 2023-02-28 MED ORDER — CHLORHEXIDINE GLUCONATE 0.12 % MT SOLN
15.0000 mL | Freq: Once | OROMUCOSAL | Status: AC
Start: 2023-03-01 — End: 2023-03-01
  Administered 2023-03-01: 15 mL via OROMUCOSAL
  Filled 2023-02-28: qty 15

## 2023-02-28 MED ORDER — TRANEXAMIC ACID (OHS) PUMP PRIME SOLUTION
2.0000 mg/kg | INTRAVENOUS | Status: DC
  Filled 2023-02-28: qty 1.07

## 2023-02-28 MED ORDER — POTASSIUM CHLORIDE 2 MEQ/ML IV SOLN
80.0000 meq | INTRAVENOUS | Status: DC
  Filled 2023-02-28: qty 40

## 2023-02-28 MED ORDER — PLASMA-LYTE A IV SOLN
INTRAVENOUS | Status: DC
  Filled 2023-02-28: qty 2.5

## 2023-02-28 MED ORDER — MANNITOL 20 % IV SOLN
INTRAVENOUS | Status: DC
  Filled 2023-02-28: qty 13

## 2023-02-28 MED ORDER — MILRINONE LACTATE IN DEXTROSE 20-5 MG/100ML-% IV SOLN
0.3000 ug/kg/min | INTRAVENOUS | Status: AC
  Administered 2023-03-01: .375 ug/kg/min via INTRAVENOUS
  Filled 2023-02-28: qty 100

## 2023-02-28 MED ORDER — CEFAZOLIN SODIUM-DEXTROSE 2-4 GM/100ML-% IV SOLN
2.0000 g | INTRAVENOUS | Status: AC
  Administered 2023-03-01: 2 g via INTRAVENOUS
  Filled 2023-02-28: qty 100

## 2023-02-28 MED ORDER — TRANEXAMIC ACID 1000 MG/10ML IV SOLN
1.5000 mg/kg/h | INTRAVENOUS | Status: AC
  Administered 2023-03-01: 1.5 mg/kg/h via INTRAVENOUS
  Filled 2023-02-28: qty 20

## 2023-02-28 MED ORDER — METOPROLOL TARTRATE 12.5 MG HALF TABLET
12.5000 mg | ORAL_TABLET | Freq: Once | ORAL | Status: AC
  Administered 2023-03-01: 12.5 mg via ORAL
  Filled 2023-02-28: qty 1

## 2023-02-28 MED ORDER — CHLORHEXIDINE GLUCONATE CLOTH 2 % EX PADS
6.0000 | MEDICATED_PAD | Freq: Once | CUTANEOUS | Status: AC
Start: 2023-03-01 — End: 2023-03-01
  Administered 2023-03-01: 6 via TOPICAL

## 2023-02-28 NOTE — Progress Notes (Signed)
PHARMACY - ANTICOAGULATION CONSULT NOTE  Pharmacy Consult for Heparin Indication: NSTEMI, multi-vessel CAD  No Active Allergies   Patient Measurements: Height: 68"  Weight: 55.7 kg (initial weight 56.5 kg, repeated due to weight 63.7 kg at [facility]) - lasix 60 IV q12h x 4 doses given at [facility] 12/1>>12/3 IBW/kg: 68.4 kg Heparin Dosing Weight: 55.7 kg   Vital Signs: Temp: 97.6 F (36.4 C) (12/18 0759) Temp Source: Oral (12/18 0759) BP: 120/66 (12/18 0759) Pulse Rate: 82 (12/18 0759)  Labs: Recent Labs    02/26/23 0438 02/26/23 1005 02/26/23 1005 02/27/23 0403 02/28/23 0409  HGB  --  11.3*   < > 11.2* 11.2*  HCT  --  35.7*  --  35.1* 35.1*  PLT  --  340  --  338 319  HEPARINUNFRC 0.43  --   --  0.55 0.46  CREATININE  --  0.71  --  0.79 0.75   < > = values in this interval not displayed.    Estimated Creatinine Clearance: 58.7 mL/min (by C-G formula based on SCr of 0.75 mg/dL).   Medical History: Past Medical History:  Diagnosis Date   Essential hypertension    HFrEF (heart failure with reduced ejection fraction) (HCC)    Mitral regurgitation     Assessment: Patient is a 77 y.o. male who presented to the ED on 02/10/2023 with SOB and chest pain. PMH includes HFrEF with EF < 20 RV mod reduced and mildly enlarged, mod pulm artery pressure, LA mod dilated, RA mildly dilated, mod pleural effusion, severe MVR. On admission BNP > 4,500 and troponins 13,606. 12/3 taken for Right/Left heart cath and coronary angiography. Showed severe three-vessel disease. Post procedure recommends CABG +/- MVR. Pharmacy consulted to dose and manage IV heparin until OR. Noted plans for CABG on 12/19.   Heparin remains therapeutic, CBC ok.  Goal of Therapy:  Heparin level 0.3-0.7 units/ml Monitor platelets by anticoagulation protocol: Yes   Plan:  Continue heparin infusion at 1350 units/h Daily heparin level and CBC Monitor for new s/sx of bleeding   Fredonia Highland, PharmD, BCPS,  [facility] Clinical Pharmacist [phone] Please check AMION for all [facility] Pharmacy numbers 02/28/2023

## 2023-02-28 NOTE — TOC Progression Note (Addendum)
Transition of Care [facility]) - Progression Note    Patient Details  Name: Walter Vargas MRN: [id number] Date of Birth: 1945-06-27  [facility] CM/SW Contact  Nicanor Bake Phone Number: [phone] 02/28/2023, 10:43 AM  Clinical Narrative:   HF CSW called and left a VM for pts daughter, Walter Vargas asking to be called back.   1:54 PM- CSW received a phone call from pts daughter, Walter Vargas. [name] stated that her brother will be traveling from [facility] and will be working remote for a few weeks to take care of their father. [name] stated that they will take care of him in his home and she will be bale to assist as needed.   TOC will continue following.          Expected Discharge Plan and Services   Discharge Planning Services: CM Consult                     DME Arranged: Walker rolling with seat DME Agency: [employer] Date DME Agency Contacted: 02/21/23 Time DME Agency Contacted: 1341 Representative spoke with at DME Agency: Vaughan Basta             Social Determinants of Health (SDOH) Interventions SDOH Screenings   Food Insecurity: No Food Insecurity (02/14/2023)  Housing: Low Risk  (02/14/2023)  Transportation Needs: No Transportation Needs (02/14/2023)  Utilities: Not At Risk (02/14/2023)  Depression (PHQ2-9): Low Risk  (10/19/2022)  Tobacco Use: Medium Risk (02/14/2023)    Readmission Risk Interventions     No data to display

## 2023-02-28 NOTE — Anesthesia Preprocedure Evaluation (Signed)
Anesthesia Evaluation  Patient identified by MRN, date of birth, ID band Patient awake    Reviewed: Allergy & Precautions, H&P , NPO status , Patient's Chart, lab work & pertinent test results  Airway Mallampati: II  TM Distance: >3 FB Neck ROM: Full    Dental no notable dental hx. (+) Poor Dentition, Dental Advisory Given   Pulmonary neg pulmonary ROS, former smoker   Pulmonary exam normal breath sounds clear to auscultation       Cardiovascular Exercise Tolerance: Good hypertension, Pt. on medications and Pt. on home beta blockers + Past MI and +CHF   Rhythm:Regular Rate:Normal     Neuro/Psych negative neurological ROS  negative psych ROS   GI/Hepatic negative GI ROS, Neg liver ROS,,,  Endo/Other  negative endocrine ROS    Renal/GU negative Renal ROS  negative genitourinary   Musculoskeletal   Abdominal   Peds  Hematology negative hematology ROS (+)   Anesthesia Other Findings   Reproductive/Obstetrics negative OB ROS                             Anesthesia Physical Anesthesia Plan  ASA: 4  Anesthesia Plan: General   Post-op Pain Management: Tylenol PO (pre-op)*   Induction: Intravenous  PONV Risk Score and Plan: 3 and Ondansetron, Midazolam and Treatment may vary due to age or medical condition  Airway Management Planned: Oral ETT  Additional Equipment: Arterial line, CVP, PA Cath, TEE, 3D TEE and Ultrasound Guidance Line Placement  Intra-op Plan:   Post-operative Plan: Post-operative intubation/ventilation  Informed Consent: I have reviewed the patients History and Physical, chart, labs and discussed the procedure including the risks, benefits and alternatives for the proposed anesthesia with the patient or authorized representative who has indicated his/her understanding and acceptance.     Dental advisory given  Plan Discussed with: CRNA  Anesthesia Plan Comments:         Anesthesia Quick Evaluation

## 2023-02-28 NOTE — Plan of Care (Signed)
Will continue to monitor patient.

## 2023-02-28 NOTE — Progress Notes (Signed)
Advanced Heart Failure Rounding Note  HF Cardiologist: Dr. Gala Romney  Subjective:    CABG scheduled for tomorrow.  Feeling well. Has been up ambulating the halls.   Objective:   Weight Range: 53.7 kg Body mass index is 17.99 kg/m.   Vital Signs:   Temp:  [97.6 F (36.4 C)-98.2 F (36.8 C)] 97.6 F (36.4 C) (12/18 0759) Pulse Rate:  [64-82] 82 (12/18 0759) Resp:  [16-20] 18 (12/18 0759) BP: (110-123)/(66-94) 120/66 (12/18 0759) SpO2:  [90 %-98 %] 90 % (12/18 0439) Weight:  [53.7 kg] 53.7 kg (12/18 0439) Last BM Date : 02/27/23  Weight change: Filed Weights   02/26/23 0414 02/27/23 0339 02/28/23 0439  Weight: 55.1 kg 54.4 kg 53.7 kg    Intake/Output:   Intake/Output Summary (Last 24 hours) at 02/28/2023 0943 Last data filed at 02/28/2023 3244 Gross per 24 hour  Intake 1177.78 ml  Output 1990 ml  Net -812.22 ml    Physical Exam   General:  Thin, frail appearing elderly male HEENT: normal Neck: supple. no JVD.  Cor: PMI nondisplaced. Regular rate & rhythm. No rubs, gallops or murmurs. Lungs: clear Abdomen: soft, nontender, nondistended.  Extremities: no cyanosis, clubbing, rash, edema Neuro: alert & orientedx3. Affect pleasant. Forgetful.   Telemetry   SR 60s-70s  Labs    CBC Recent Labs    02/27/23 0403 02/28/23 0409  WBC 7.4 7.0  HGB 11.2* 11.2*  HCT 35.1* 35.1*  MCV 84.6 84.6  PLT 338 319   Basic Metabolic Panel Recent Labs    03/14/70 0403 02/28/23 0409  NA 133* 133*  K 4.6 4.6  CL 100 100  CO2 25 25  GLUCOSE 94 88  BUN 19 21  CREATININE 0.79 0.75  CALCIUM 9.2 9.1   Liver Function Tests No results for input(s): "AST", "ALT", "ALKPHOS", "BILITOT", "PROT", "ALBUMIN" in the last 72 hours.  No results for input(s): "LIPASE", "AMYLASE" in the last 72 hours. Cardiac Enzymes No results for input(s): "CKTOTAL", "CKMB", "CKMBINDEX", "TROPONINI" in the last 72 hours.  BNP: BNP (last 3 results) Recent Labs    02/10/23 1120   BNP >4,500.0*   ProBNP (last 3 results) Recent Labs    10/12/22 1030 10/19/22 0852 01/08/23 0957  PROBNP 2,878.0* 2,297.0* >4978.0*   D-Dimer No results for input(s): "DDIMER" in the last 72 hours. Hemoglobin A1C No results for input(s): "HGBA1C" in the last 72 hours.  Fasting Lipid Panel No results for input(s): "CHOL", "HDL", "LDLCALC", "TRIG", "CHOLHDL", "LDLDIRECT" in the last 72 hours. Thyroid Function Tests No results for input(s): "TSH", "T4TOTAL", "T3FREE", "THYROIDAB" in the last 72 hours.  Invalid input(s): "FREET3"  Other results:  Imaging   No results found.  Medications:    Scheduled Medications:  aspirin  81 mg Oral Daily   atorvastatin  80 mg Oral Daily   Chlorhexidine Gluconate Cloth  6 each Topical Once   And   Chlorhexidine Gluconate Cloth  6 each Topical Once   digoxin  0.125 mg Oral Daily   furosemide  20 mg Oral Daily   metoprolol succinate  12.5 mg Oral Daily   polyethylene glycol  17 g Oral Daily   senna-docusate  2 tablet Oral QHS   sodium chloride flush  3 mL Intravenous Q12H   spironolactone  25 mg Oral Daily    Infusions:  heparin 1,350 Units/hr (02/28/23 0011)    PRN Medications: acetaminophen, ondansetron (ZOFRAN) IV, sodium chloride flush  Patient Profile   Mr Desimone is a  77 y.o. history of HTN, MR, HFrEF, and severe 3V CAD.  Transferred to [facility] for CT surgery consultation for possible CABG.   Assessment/Plan   1. CAD: Patient admitted with NSTEMI (?culprit D2) but with severe extensive diffuse 3 vessel disease.  Disease too extensive to treat percutaneously.  Anatomy would be best served by CABG, but CABG would be moderate-high risk given cardiomyopathy.  Echo 12/4 showed mild-moderate RV dysfunction and PAPi was adequate on RHC, so do not think RV function will be a limitation.   - We could obtain a viability study, but LV walls are not severely thinned and presence/absence of viability has not been shown to have a  significant impact on outcome for CABG in heart failure - High risk CABG now postponed to 12/19 d/t COVID +. Completed Paxlovid. - No chest pain.  - On heparin gtt. - Continue ASA 81 - Held statin w/ mildly elevated LFTs while being treated w/ Plaxlovid. Will need to restart prior to discharge.  2. Acute on chronic systolic CHF: Patient has had HF symptoms for at least a couple of months.  Suspect pre-existing ischemic cardiomyopathy then he presented this admission with NSTEMI possible in D2 territory leading to CHF exacerbation. He has been diuresed, RHC 12/3 showed normal filling pressures and low CI at 2.0. Echo 12/4: EF 20%, RV mild-moderately dysfunctional, mild-moderate MR. Suspect patient's best chance to improve LV function will be CABG.  - Volume status appears stable.   - Continue Lasix 20 PO daily - Continue Digoxin 0.125 daily.  - Continue Spironolactone 25 daily.  - Continue Metoprolol 12.5 daily - Hold on adding SGLT2i and ARB/ARNi until post CABG  - Renal function stable   3. 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  Patient has had on and off confusion in hospital.  - He would be at risk for worsening post-op delirium in setting of CABG.    4. Gallbladder disease: Patient had gallstones but HIDA scan not suggestive of cholecystitis.  5. COVID+: Completed Paxlovid  6. Fall: Had inpatient fall. No injuries. Did not hit his head.   7. 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  Consulted HF TOC CM/CSW to assist with disposition post-op.  Length of Stay: 14  Oswin Johal N, PA-C  02/28/2023, 9:43 AM  Advanced Heart Failure Team Pager [phone] (M-F; 7a - 5p)  Please contact [facility] for night-coverage after hours (5p -7a ) and weekends on [email or web address]

## 2023-02-28 NOTE — Progress Notes (Signed)
Procedure(s) (LRB): CORONARY ARTERY BYPASS GRAFTING (CABG) (N/A) possible MITRAL VALVE REPAIR or replacement (MVR) (N/A) TRANSESOPHAGEAL ECHOCARDIOGRAM (TEE) (N/A) Subjective: No chest pain or shortness of breath.  A little confusion re: who has seen him today  Objective: Vital signs in last 24 hours: Temp:  [97.6 F (36.4 C)-98.2 F (36.8 C)] 97.8 F (36.6 C) (12/18 1220) Pulse Rate:  [64-82] 74 (12/18 1220) Cardiac Rhythm: Normal sinus rhythm (12/18 0700) Resp:  [18-20] 18 (12/18 1220) BP: (101-123)/(66-94) 101/79 (12/18 1220) SpO2:  [90 %-100 %] 100 % (12/18 1220) Weight:  [53.7 kg] 53.7 kg (12/18 0439)  Hemodynamic parameters for last 24 hours:    Intake/Output from previous day: 12/17 0701 - 12/18 0700 In: 1413.8 [P.O.:1072; I.V.:341.8] Out: 1840 [Urine:1840] Intake/Output this shift: Total I/O In: -  Out: 650 [Urine:650]  General appearance: alert, cooperative, and no distress Neurologic: intact Heart: regular rate and rhythm Lungs: clear to auscultation bilaterally Extremities: no edema, hematoma right forearm  Lab Results: Recent Labs    02/27/23 0403 02/28/23 0409  WBC 7.4 7.0  HGB 11.2* 11.2*  HCT 35.1* 35.1*  PLT 338 319   BMET:  Recent Labs    02/27/23 0403 02/28/23 0409  NA 133* 133*  K 4.6 4.6  CL 100 100  CO2 25 25  GLUCOSE 94 88  BUN 19 21  CREATININE 0.79 0.75  CALCIUM 9.2 9.1    PT/INR: No results for input(s): "LABPROT", "INR" in the last 72 hours. ABG    Component Value Date/Time   PHART 7.46 (H) 02/18/2023 0500   HCO3 25.7 02/18/2023 0500   TCO2 26 02/13/2023 1327   O2SAT 99.2 02/18/2023 0500   CBG (last 3)  No results for input(s): "GLUCAP" in the last 72 hours.  Assessment/Plan: S/P Procedure(s) (LRB): CORONARY ARTERY BYPASS GRAFTING (CABG) (N/A) possible MITRAL VALVE REPAIR or replacement (MVR) (N/A) TRANSESOPHAGEAL ECHOCARDIOGRAM (TEE) (N/A) Severe 3 vessel CAD with moderate MR  Plan for coronary bypass  grafting and possible mitral valve repair or replacement tomorrow a.m.  Will assess mitral valve with intraoperative TEE.  Will be interesting to see left-ventricular function and mitral function now that he is compensated, whereas previously he was severely decompensated.  I again discussed the proposed operative procedure with him.  He understands the need for general anesthesia, the incisions to be used, the use of cardiopulmonary bypass, the use of drainage tubes and temporary pacemaker wires postoperatively, and the possible need for mechanical circulatory support.  I informed him of the indications, risks, benefits, and alternatives.  We had previously discussed these.  He is aware of the risks include but not limited to death, MI, DVT, PE, bleeding, possible need for transfusion, infection, cardiac arrhythmias, respiratory or renal failure, as well as possibility of other unforeseeable complications.  He accepts the risks and agrees to proceed   LOS: 14 days    [name] 02/28/2023

## 2023-03-01 ENCOUNTER — Inpatient Hospital Stay (HOSPITAL_COMMUNITY): Payer: 59

## 2023-03-01 ENCOUNTER — Inpatient Hospital Stay (HOSPITAL_COMMUNITY): Payer: 59 | Admitting: Certified Registered Nurse Anesthetist

## 2023-03-01 ENCOUNTER — Other Ambulatory Visit: Payer: Self-pay

## 2023-03-01 ENCOUNTER — Inpatient Hospital Stay (HOSPITAL_COMMUNITY)
Admission: AD | Disposition: A | Discharge status: Rehabilitation Facility | Payer: Self-pay | Source: Other Acute Inpatient Hospital | Attending: Cardiology

## 2023-03-01 ENCOUNTER — Encounter (HOSPITAL_COMMUNITY)
Admission: AD | Disposition: A | Discharge status: Rehabilitation Facility | Payer: Self-pay | Source: Other Acute Inpatient Hospital | Attending: Cardiology

## 2023-03-01 DIAGNOSIS — I2510 Atherosclerotic heart disease of native coronary artery without angina pectoris: Secondary | ICD-10-CM

## 2023-03-01 DIAGNOSIS — T8111XA Postprocedural  cardiogenic shock, initial encounter: Secondary | ICD-10-CM

## 2023-03-01 DIAGNOSIS — I110 Hypertensive heart disease with heart failure: Secondary | ICD-10-CM

## 2023-03-01 DIAGNOSIS — R570 Cardiogenic shock: Secondary | ICD-10-CM

## 2023-03-01 DIAGNOSIS — I509 Heart failure, unspecified: Secondary | ICD-10-CM

## 2023-03-01 DIAGNOSIS — I6529 Occlusion and stenosis of unspecified carotid artery: Secondary | ICD-10-CM

## 2023-03-01 DIAGNOSIS — I340 Nonrheumatic mitral (valve) insufficiency: Secondary | ICD-10-CM

## 2023-03-01 HISTORY — PX: TEE WITHOUT CARDIOVERSION: SHX5443

## 2023-03-01 HISTORY — PX: EXPLORATION POST OPERATIVE OPEN HEART: SHX5061

## 2023-03-01 HISTORY — PX: CORONARY ARTERY BYPASS GRAFT: SHX141

## 2023-03-01 HISTORY — PX: PLACEMENT OF IMPELLA LEFT VENTRICULAR ASSIST DEVICE: SHX6519

## 2023-03-01 LAB — POCT I-STAT 7, (LYTES, BLD GAS, ICA,H+H)
Acid-Base Excess: 0 mmol/L (ref 0.0–2.0)
Acid-base deficit: 1 mmol/L (ref 0.0–2.0)
Acid-base deficit: 1 mmol/L (ref 0.0–2.0)
Acid-base deficit: 3 mmol/L — ABNORMAL HIGH (ref 0.0–2.0)
Acid-base deficit: 3 mmol/L — ABNORMAL HIGH (ref 0.0–2.0)
Acid-base deficit: 8 mmol/L — ABNORMAL HIGH (ref 0.0–2.0)
Bicarbonate: 24.7 mmol/L (ref 20.0–28.0)
Bicarbonate: 23.2 mmol/L (ref 20.0–28.0)
Bicarbonate: 23.7 mmol/L (ref 20.0–28.0)
Bicarbonate: 18.9 mmol/L — ABNORMAL LOW (ref 20.0–28.0)
Bicarbonate: 22.7 mmol/L (ref 20.0–28.0)
Bicarbonate: 17.8 mmol/L — ABNORMAL LOW (ref 20.0–28.0)
Calcium, Ion: 1.26 mmol/L (ref 1.15–1.40)
Calcium, Ion: 1.07 mmol/L — ABNORMAL LOW (ref 1.15–1.40)
Calcium, Ion: 1.09 mmol/L — ABNORMAL LOW (ref 1.15–1.40)
Calcium, Ion: 1.09 mmol/L — ABNORMAL LOW (ref 1.15–1.40)
Calcium, Ion: 1.2 mmol/L (ref 1.15–1.40)
Calcium, Ion: 1.17 mmol/L (ref 1.15–1.40)
HCT: 33 % — ABNORMAL LOW (ref 39.0–52.0)
HCT: 25 % — ABNORMAL LOW (ref 39.0–52.0)
HCT: 26 % — ABNORMAL LOW (ref 39.0–52.0)
HCT: 26 % — ABNORMAL LOW (ref 39.0–52.0)
HCT: 26 % — ABNORMAL LOW (ref 39.0–52.0)
HCT: 16 % — ABNORMAL LOW (ref 39.0–52.0)
Hemoglobin: 11.2 g/dL — ABNORMAL LOW (ref 13.0–17.0)
Hemoglobin: 8.5 g/dL — ABNORMAL LOW (ref 13.0–17.0)
Hemoglobin: 8.8 g/dL — ABNORMAL LOW (ref 13.0–17.0)
Hemoglobin: 8.8 g/dL — ABNORMAL LOW (ref 13.0–17.0)
Hemoglobin: 8.8 g/dL — ABNORMAL LOW (ref 13.0–17.0)
Hemoglobin: 5.4 g/dL — CL (ref 13.0–17.0)
O2 Saturation: 100 %
O2 Saturation: 100 %
O2 Saturation: 100 %
O2 Saturation: 100 %
O2 Saturation: 97 %
O2 Saturation: 100 %
Potassium: 4.4 mmol/L (ref 3.5–5.1)
Potassium: 4.3 mmol/L (ref 3.5–5.1)
Potassium: 6.3 mmol/L (ref 3.5–5.1)
Potassium: 4.9 mmol/L (ref 3.5–5.1)
Potassium: 4.8 mmol/L (ref 3.5–5.1)
Potassium: 3.3 mmol/L — ABNORMAL LOW (ref 3.5–5.1)
Sodium: 134 mmol/L — ABNORMAL LOW (ref 135–145)
Sodium: 134 mmol/L — ABNORMAL LOW (ref 135–145)
Sodium: 131 mmol/L — ABNORMAL LOW (ref 135–145)
Sodium: 135 mmol/L (ref 135–145)
Sodium: 136 mmol/L (ref 135–145)
Sodium: 138 mmol/L (ref 135–145)
TCO2: 26 mmol/L (ref 22–32)
TCO2: 24 mmol/L (ref 22–32)
TCO2: 25 mmol/L (ref 22–32)
TCO2: 20 mmol/L — ABNORMAL LOW (ref 22–32)
TCO2: 24 mmol/L (ref 22–32)
TCO2: 19 mmol/L — ABNORMAL LOW (ref 22–32)
pCO2 arterial: 38.3 mm[Hg] (ref 32–48)
pCO2 arterial: 35.7 mm[Hg] (ref 32–48)
pCO2 arterial: 38.3 mm[Hg] (ref 32–48)
pCO2 arterial: 22.7 mm[Hg] — ABNORMAL LOW (ref 32–48)
pCO2 arterial: 41.6 mmHg (ref 32–48)
pCO2 arterial: 39.3 mm[Hg] (ref 32–48)
pH, Arterial: 7.417 (ref 7.35–7.45)
pH, Arterial: 7.421 (ref 7.35–7.45)
pH, Arterial: 7.4 (ref 7.35–7.45)
pH, Arterial: 7.528 — ABNORMAL HIGH (ref 7.35–7.45)
pH, Arterial: 7.346 — ABNORMAL LOW (ref 7.35–7.45)
pH, Arterial: 7.263 — ABNORMAL LOW (ref 7.35–7.45)
pO2, Arterial: 503 mm[Hg] — ABNORMAL HIGH (ref 83–108)
pO2, Arterial: 299 mm[Hg] — ABNORMAL HIGH (ref 83–108)
pO2, Arterial: 312 mm[Hg] — ABNORMAL HIGH (ref 83–108)
pO2, Arterial: 456 mm[Hg] — ABNORMAL HIGH (ref 83–108)
pO2, Arterial: 97 mmHg (ref 83–108)
pO2, Arterial: 428 mm[Hg] — ABNORMAL HIGH (ref 83–108)

## 2023-03-01 LAB — POCT I-STAT, CHEM 8
BUN: 20 mg/dL (ref 8–23)
BUN: 19 mg/dL (ref 8–23)
BUN: 17 mg/dL (ref 8–23)
BUN: 17 mg/dL (ref 8–23)
BUN: 17 mg/dL (ref 8–23)
BUN: 16 mg/dL (ref 8–23)
BUN: 12 mg/dL (ref 8–23)
Calcium, Ion: 1.25 mmol/L (ref 1.15–1.40)
Calcium, Ion: 1.27 mmol/L (ref 1.15–1.40)
Calcium, Ion: 1.12 mmol/L — ABNORMAL LOW (ref 1.15–1.40)
Calcium, Ion: 1.08 mmol/L — ABNORMAL LOW (ref 1.15–1.40)
Calcium, Ion: 1.28 mmol/L (ref 1.15–1.40)
Calcium, Ion: 1.16 mmol/L (ref 1.15–1.40)
Calcium, Ion: 1.18 mmol/L (ref 1.15–1.40)
Chloride: 99 mmol/L (ref 98–111)
Chloride: 97 mmol/L — ABNORMAL LOW (ref 98–111)
Chloride: 98 mmol/L (ref 98–111)
Chloride: 100 mmol/L (ref 98–111)
Chloride: 103 mmol/L (ref 98–111)
Chloride: 100 mmol/L (ref 98–111)
Chloride: 104 mmol/L (ref 98–111)
Creatinine, Ser: 0.6 mg/dL — ABNORMAL LOW (ref 0.61–1.24)
Creatinine, Ser: 0.5 mg/dL — ABNORMAL LOW (ref 0.61–1.24)
Creatinine, Ser: 0.5 mg/dL — ABNORMAL LOW (ref 0.61–1.24)
Creatinine, Ser: 0.5 mg/dL — ABNORMAL LOW (ref 0.61–1.24)
Creatinine, Ser: 0.4 mg/dL — ABNORMAL LOW (ref 0.61–1.24)
Creatinine, Ser: 0.5 mg/dL — ABNORMAL LOW (ref 0.61–1.24)
Creatinine, Ser: 0.4 mg/dL — ABNORMAL LOW (ref 0.61–1.24)
Glucose, Bld: 96 mg/dL (ref 70–99)
Glucose, Bld: 125 mg/dL — ABNORMAL HIGH (ref 70–99)
Glucose, Bld: 124 mg/dL — ABNORMAL HIGH (ref 70–99)
Glucose, Bld: 127 mg/dL — ABNORMAL HIGH (ref 70–99)
Glucose, Bld: 188 mg/dL — ABNORMAL HIGH (ref 70–99)
Glucose, Bld: 169 mg/dL — ABNORMAL HIGH (ref 70–99)
Glucose, Bld: 184 mg/dL — ABNORMAL HIGH (ref 70–99)
HCT: 35 % — ABNORMAL LOW (ref 39.0–52.0)
HCT: 32 % — ABNORMAL LOW (ref 39.0–52.0)
HCT: 26 % — ABNORMAL LOW (ref 39.0–52.0)
HCT: 24 % — ABNORMAL LOW (ref 39.0–52.0)
HCT: 22 % — ABNORMAL LOW (ref 39.0–52.0)
HCT: 28 % — ABNORMAL LOW (ref 39.0–52.0)
HCT: 16 % — ABNORMAL LOW (ref 39.0–52.0)
Hemoglobin: 11.9 g/dL — ABNORMAL LOW (ref 13.0–17.0)
Hemoglobin: 10.9 g/dL — ABNORMAL LOW (ref 13.0–17.0)
Hemoglobin: 8.8 g/dL — ABNORMAL LOW (ref 13.0–17.0)
Hemoglobin: 8.2 g/dL — ABNORMAL LOW (ref 13.0–17.0)
Hemoglobin: 7.5 g/dL — ABNORMAL LOW (ref 13.0–17.0)
Hemoglobin: 9.5 g/dL — ABNORMAL LOW (ref 13.0–17.0)
Hemoglobin: 5.4 g/dL — CL (ref 13.0–17.0)
Potassium: 4.4 mmol/L (ref 3.5–5.1)
Potassium: 4.3 mmol/L (ref 3.5–5.1)
Potassium: 5.9 mmol/L — ABNORMAL HIGH (ref 3.5–5.1)
Potassium: 6.7 mmol/L (ref 3.5–5.1)
Potassium: 4.5 mmol/L (ref 3.5–5.1)
Potassium: 4.8 mmol/L (ref 3.5–5.1)
Potassium: 3.3 mmol/L — ABNORMAL LOW (ref 3.5–5.1)
Sodium: 134 mmol/L — ABNORMAL LOW (ref 135–145)
Sodium: 134 mmol/L — ABNORMAL LOW (ref 135–145)
Sodium: 131 mmol/L — ABNORMAL LOW (ref 135–145)
Sodium: 132 mmol/L — ABNORMAL LOW (ref 135–145)
Sodium: 135 mmol/L (ref 135–145)
Sodium: 135 mmol/L (ref 135–145)
Sodium: 138 mmol/L (ref 135–145)
TCO2: 25 mmol/L (ref 22–32)
TCO2: 25 mmol/L (ref 22–32)
TCO2: 25 mmol/L (ref 22–32)
TCO2: 25 mmol/L (ref 22–32)
TCO2: 22 mmol/L (ref 22–32)
TCO2: 23 mmol/L (ref 22–32)
TCO2: 20 mmol/L — ABNORMAL LOW (ref 22–32)

## 2023-03-01 LAB — CBC
HCT: 33.7 % — ABNORMAL LOW (ref 39.0–52.0)
HCT: 26.1 % — ABNORMAL LOW (ref 39.0–52.0)
HCT: 18.9 % — ABNORMAL LOW (ref 39.0–52.0)
HCT: 25.4 % — ABNORMAL LOW (ref 39.0–52.0)
Hemoglobin: 10.8 g/dL — ABNORMAL LOW (ref 13.0–17.0)
Hemoglobin: 8.4 g/dL — ABNORMAL LOW (ref 13.0–17.0)
Hemoglobin: 6.1 g/dL — CL (ref 13.0–17.0)
Hemoglobin: 8.6 g/dL — ABNORMAL LOW (ref 13.0–17.0)
MCH: 27.3 pg (ref 26.0–34.0)
MCH: 28.2 pg (ref 26.0–34.0)
MCH: 29.2 pg (ref 26.0–34.0)
MCH: 30.4 pg (ref 26.0–34.0)
MCHC: 32 g/dL (ref 30.0–36.0)
MCHC: 32.2 g/dL (ref 30.0–36.0)
MCHC: 32.3 g/dL (ref 30.0–36.0)
MCHC: 33.9 g/dL (ref 30.0–36.0)
MCV: 85.1 fL (ref 80.0–100.0)
MCV: 87.6 fL (ref 80.0–100.0)
MCV: 90.4 fL (ref 80.0–100.0)
MCV: 89.8 fL (ref 80.0–100.0)
Platelets: 328 10*3/uL (ref 150–400)
Platelets: 149 10*3/uL — ABNORMAL LOW (ref 150–400)
Platelets: 113 10*3/uL — ABNORMAL LOW (ref 150–400)
Platelets: 99 10*3/uL — ABNORMAL LOW (ref 150–400)
RBC: 3.96 MIL/uL — ABNORMAL LOW (ref 4.22–5.81)
RBC: 2.98 MIL/uL — ABNORMAL LOW (ref 4.22–5.81)
RBC: 2.09 MIL/uL — ABNORMAL LOW (ref 4.22–5.81)
RBC: 2.83 MIL/uL — ABNORMAL LOW (ref 4.22–5.81)
RDW: 20.4 % — ABNORMAL HIGH (ref 11.5–15.5)
RDW: 18.6 % — ABNORMAL HIGH (ref 11.5–15.5)
RDW: 17.2 % — ABNORMAL HIGH (ref 11.5–15.5)
RDW: 14.9 % (ref 11.5–15.5)
WBC: 8.2 10*3/uL (ref 4.0–10.5)
WBC: 15.1 10*3/uL — ABNORMAL HIGH (ref 4.0–10.5)
WBC: 16.2 10*3/uL — ABNORMAL HIGH (ref 4.0–10.5)
WBC: 13.6 10*3/uL — ABNORMAL HIGH (ref 4.0–10.5)
nRBC: 0 % (ref 0.0–0.2)
nRBC: 0 % (ref 0.0–0.2)
nRBC: 0.1 % (ref 0.0–0.2)
nRBC: 0 % (ref 0.0–0.2)

## 2023-03-01 LAB — DIC (DISSEMINATED INTRAVASCULAR COAGULATION)PANEL
D-Dimer, Quant: 1.64 ug{FEU}/mL — ABNORMAL HIGH (ref 0.00–0.50)
D-Dimer, Quant: 0.89 ug{FEU}/mL — ABNORMAL HIGH (ref 0.00–0.50)
D-Dimer, Quant: 0.74 ug{FEU}/mL — ABNORMAL HIGH (ref 0.00–0.50)
Fibrinogen: 180 mg/dL — ABNORMAL LOW (ref 210–475)
Fibrinogen: 174 mg/dL — ABNORMAL LOW (ref 210–475)
Fibrinogen: 163 mg/dL — ABNORMAL LOW (ref 210–475)
INR: 1.8 — ABNORMAL HIGH (ref 0.8–1.2)
INR: 1.6 — ABNORMAL HIGH (ref 0.8–1.2)
INR: 1.1 (ref 0.8–1.2)
Platelets: 149 10*3/uL — ABNORMAL LOW (ref 150–400)
Platelets: 114 10*3/uL — ABNORMAL LOW (ref 150–400)
Platelets: 97 10*3/uL — ABNORMAL LOW (ref 150–400)
Prothrombin Time: 20.9 s — ABNORMAL HIGH (ref 11.4–15.2)
Prothrombin Time: 19.7 s — ABNORMAL HIGH (ref 11.4–15.2)
Prothrombin Time: 14.3 s (ref 11.4–15.2)
Smear Review: NONE SEEN
Smear Review: NONE SEEN
Smear Review: NONE SEEN
aPTT: 38 s — ABNORMAL HIGH (ref 24–36)
aPTT: 61 s — ABNORMAL HIGH (ref 24–36)
aPTT: 46 s — ABNORMAL HIGH (ref 24–36)

## 2023-03-01 LAB — GLUCOSE, CAPILLARY
Glucose-Capillary: 111 mg/dL — ABNORMAL HIGH (ref 70–99)
Glucose-Capillary: 135 mg/dL — ABNORMAL HIGH (ref 70–99)
Glucose-Capillary: 125 mg/dL — ABNORMAL HIGH (ref 70–99)
Glucose-Capillary: 154 mg/dL — ABNORMAL HIGH (ref 70–99)
Glucose-Capillary: 175 mg/dL — ABNORMAL HIGH (ref 70–99)
Glucose-Capillary: 126 mg/dL — ABNORMAL HIGH (ref 70–99)
Glucose-Capillary: 185 mg/dL — ABNORMAL HIGH (ref 70–99)
Glucose-Capillary: 194 mg/dL — ABNORMAL HIGH (ref 70–99)

## 2023-03-01 LAB — PREPARE RBC (CROSSMATCH)

## 2023-03-01 LAB — BASIC METABOLIC PANEL
Anion gap: 8 (ref 5–15)
Anion gap: 8 (ref 5–15)
BUN: 20 mg/dL (ref 8–23)
BUN: 13 mg/dL (ref 8–23)
CO2: 25 mmol/L (ref 22–32)
CO2: 21 mmol/L — ABNORMAL LOW (ref 22–32)
Calcium: 9 mg/dL (ref 8.9–10.3)
Calcium: 7.7 mg/dL — ABNORMAL LOW (ref 8.9–10.3)
Chloride: 100 mmol/L (ref 98–111)
Chloride: 105 mmol/L (ref 98–111)
Creatinine, Ser: 0.69 mg/dL (ref 0.61–1.24)
Creatinine, Ser: 0.87 mg/dL (ref 0.61–1.24)
GFR, Estimated: >60 mL/min (ref >60)
GFR, Estimated: >60 mL/min (ref >60)
Glucose, Bld: 96 mg/dL (ref 70–99)
Glucose, Bld: 210 mg/dL — ABNORMAL HIGH (ref 70–99)
Potassium: 4.1 mmol/L (ref 3.5–5.1)
Potassium: 3.7 mmol/L (ref 3.5–5.1)
Sodium: 133 mmol/L — ABNORMAL LOW (ref 135–145)
Sodium: 134 mmol/L — ABNORMAL LOW (ref 135–145)

## 2023-03-01 LAB — POCT I-STAT EG7
Acid-base deficit: 1 mmol/L (ref 0.0–2.0)
Bicarbonate: 24 mmol/L (ref 20.0–28.0)
Calcium, Ion: 1.09 mmol/L — ABNORMAL LOW (ref 1.15–1.40)
HCT: 26 % — ABNORMAL LOW (ref 39.0–52.0)
Hemoglobin: 8.8 g/dL — ABNORMAL LOW (ref 13.0–17.0)
O2 Saturation: 76 %
Potassium: 4.4 mmol/L (ref 3.5–5.1)
Sodium: 134 mmol/L — ABNORMAL LOW (ref 135–145)
TCO2: 25 mmol/L (ref 22–32)
pCO2, Ven: 38.9 mm[Hg] — ABNORMAL LOW (ref 44–60)
pH, Ven: 7.399 (ref 7.25–7.43)
pO2, Ven: 41 mm[Hg] (ref 32–45)

## 2023-03-01 LAB — APTT: aPTT: 37 s — ABNORMAL HIGH (ref 24–36)

## 2023-03-01 LAB — PLATELET COUNT: Platelets: 218 10*3/uL (ref 150–400)

## 2023-03-01 LAB — HEMOGLOBIN AND HEMATOCRIT, BLOOD
HCT: 22.3 % — ABNORMAL LOW (ref 39.0–52.0)
Hemoglobin: 7.1 g/dL — ABNORMAL LOW (ref 13.0–17.0)

## 2023-03-01 LAB — ECHO INTRAOPERATIVE TEE
Height: 68 in
Weight: 1936 [oz_av]

## 2023-03-01 LAB — PROTIME-INR
INR: 1.9 — ABNORMAL HIGH (ref 0.8–1.2)
Prothrombin Time: 21.6 s — ABNORMAL HIGH (ref 11.4–15.2)

## 2023-03-01 LAB — HEPARIN LEVEL (UNFRACTIONATED): Heparin Unfractionated: 0.52 [IU]/mL (ref 0.30–0.70)

## 2023-03-01 SURGERY — CORONARY ARTERY BYPASS GRAFTING (CABG)
Anesthesia: General | Site: Chest

## 2023-03-01 SURGERY — EXPLORATION POST OPERATIVE OPEN HEART
Anesthesia: General

## 2023-03-01 MED ORDER — BISACODYL 10 MG RE SUPP
10.0000 mg | Freq: Every day | RECTAL | Status: DC
  Administered 2023-03-02: 10 mg via RECTAL
  Filled 2023-03-01: qty 1

## 2023-03-01 MED ORDER — ETOMIDATE 2 MG/ML IV SOLN
INTRAVENOUS | Status: AC
  Filled 2023-03-01: qty 10

## 2023-03-01 MED ORDER — AMIODARONE LOAD VIA INFUSION
150.0000 mg | Freq: Once | INTRAVENOUS | Status: AC
  Administered 2023-03-01: 150 mg via INTRAVENOUS
  Filled 2023-03-01: qty 83.34

## 2023-03-01 MED ORDER — DEXTROSE 50 % IV SOLN: 0.0000 mL | INTRAVENOUS | Status: DC | PRN

## 2023-03-01 MED ORDER — ACETAMINOPHEN 500 MG PO TABS
1000.0000 mg | ORAL_TABLET | Freq: Four times a day (QID) | ORAL | Status: AC
  Filled 2023-03-01: qty 2

## 2023-03-01 MED ORDER — FENTANYL CITRATE (PF) 250 MCG/5ML IJ SOLN
INTRAMUSCULAR | Status: AC
  Filled 2023-03-01: qty 5

## 2023-03-01 MED ORDER — HEMOSTATIC AGENTS (NO CHARGE) OPTIME
TOPICAL | Status: DC | PRN
  Administered 2023-03-01: 1 via TOPICAL

## 2023-03-01 MED ORDER — ASPIRIN 81 MG PO CHEW
324.0000 mg | CHEWABLE_TABLET | Freq: Once | ORAL | Status: DC
  Filled 2023-03-01: qty 4

## 2023-03-01 MED ORDER — HEPARIN 6000 UNIT IRRIGATION SOLUTION
Status: DC | PRN
  Administered 2023-03-01: 1

## 2023-03-01 MED ORDER — BISACODYL 5 MG PO TBEC
10.0000 mg | DELAYED_RELEASE_TABLET | Freq: Every day | ORAL | Status: DC
  Administered 2023-03-03: 10 mg via ORAL
  Filled 2023-03-01: qty 2

## 2023-03-01 MED ORDER — PROTAMINE SULFATE 10 MG/ML IV SOLN
INTRAVENOUS | Status: AC
  Filled 2023-03-01: qty 25

## 2023-03-01 MED ORDER — COAGULATION FACTOR VIIA RECOMB 1 MG IV SOLR
90.0000 ug/kg | Freq: Once | INTRAVENOUS | Status: AC
  Administered 2023-03-01: 5000 ug via INTRAVENOUS
  Filled 2023-03-01: qty 5

## 2023-03-01 MED ORDER — TRANEXAMIC ACID 1000 MG/10ML IV SOLN
INTRAVENOUS | Status: DC | PRN
  Administered 2023-03-01: 1.5 mg/kg/h via INTRAVENOUS

## 2023-03-01 MED ORDER — SODIUM CHLORIDE 0.9% IV SOLUTION: Freq: Once | INTRAVENOUS | Status: DC

## 2023-03-01 MED ORDER — VASOPRESSIN 20 UNITS/100 ML INFUSION FOR SHOCK
0.0000 [IU]/min | INTRAVENOUS | Status: DC
  Administered 2023-03-01 – 2023-03-04 (×7): 0.04 [IU]/min via INTRAVENOUS
  Filled 2023-03-01 (×8): qty 100

## 2023-03-01 MED ORDER — MIDAZOLAM HCL (PF) 10 MG/2ML IJ SOLN
INTRAMUSCULAR | Status: AC
  Filled 2023-03-01: qty 2

## 2023-03-01 MED ORDER — VASOPRESSIN 20 UNIT/ML IV SOLN
INTRAVENOUS | Status: AC
  Filled 2023-03-01: qty 1

## 2023-03-01 MED ORDER — PANTOPRAZOLE SODIUM 40 MG IV SOLR
40.0000 mg | Freq: Every day | INTRAVENOUS | Status: DC
  Administered 2023-03-01: 40 mg via INTRAVENOUS
  Filled 2023-03-01: qty 10

## 2023-03-01 MED ORDER — VASOPRESSIN 20 UNITS/100 ML INFUSION FOR SHOCK
INTRAVENOUS | Status: DC | PRN
  Administered 2023-03-01: .03 [IU]/min via INTRAVENOUS

## 2023-03-01 MED ORDER — LACTATED RINGERS IV SOLN: INTRAVENOUS | Status: AC

## 2023-03-01 MED ORDER — MILRINONE LACTATE IN DEXTROSE 20-5 MG/100ML-% IV SOLN
0.3000 ug/kg/min | INTRAVENOUS | Status: DC
  Filled 2023-03-01 (×2): qty 100

## 2023-03-01 MED ORDER — FENTANYL 2500MCG IN NS 250ML (10MCG/ML) PREMIX INFUSION
0.0000 ug/h | INTRAVENOUS | Status: AC
Start: 2023-03-01 — End: ?
  Administered 2023-03-01 – 2023-03-02 (×2): 25 ug/h via INTRAVENOUS
  Filled 2023-03-01: qty 250

## 2023-03-01 MED ORDER — MIDAZOLAM HCL (PF) 5 MG/ML IJ SOLN
INTRAMUSCULAR | Status: DC | PRN
  Administered 2023-03-01: 2 mg via INTRAVENOUS
  Administered 2023-03-01: 4 mg via INTRAVENOUS
  Administered 2023-03-01: 3 mg via INTRAVENOUS
  Administered 2023-03-01: 2 mg via INTRAVENOUS
  Administered 2023-03-01: 1 mg via INTRAVENOUS

## 2023-03-01 MED ORDER — ACETAMINOPHEN 160 MG/5ML PO SOLN
1000.0000 mg | Freq: Four times a day (QID) | ORAL | Status: AC
  Administered 2023-03-02 – 2023-03-06 (×17): 1000 mg
  Filled 2023-03-01 (×18): qty 40.6

## 2023-03-01 MED ORDER — LACTATED RINGERS IV SOLN: INTRAVENOUS | Status: DC | PRN

## 2023-03-01 MED ORDER — PROPOFOL 10 MG/ML IV BOLUS
INTRAVENOUS | Status: AC
  Filled 2023-03-01: qty 20

## 2023-03-01 MED ORDER — MAGNESIUM SULFATE 4 GM/100ML IV SOLN
4.0000 g | Freq: Once | INTRAVENOUS | Status: AC
  Administered 2023-03-01: 4 g via INTRAVENOUS
  Filled 2023-03-01: qty 100

## 2023-03-01 MED ORDER — SODIUM CHLORIDE (PF) 0.9 % IJ SOLN: OROMUCOSAL | Status: DC | PRN

## 2023-03-01 MED ORDER — DOCUSATE SODIUM 100 MG PO CAPS: 200.0000 mg | ORAL_CAPSULE | Freq: Every day | ORAL | Status: DC

## 2023-03-01 MED ORDER — FENTANYL CITRATE (PF) 250 MCG/5ML IJ SOLN
INTRAMUSCULAR | Status: DC | PRN
  Administered 2023-03-01: 50 ug via INTRAVENOUS

## 2023-03-01 MED ORDER — SODIUM CHLORIDE 0.9% FLUSH: 3.0000 mL | INTRAVENOUS | Status: DC | PRN

## 2023-03-01 MED ORDER — MORPHINE SULFATE (PF) 2 MG/ML IV SOLN
1.0000 mg | INTRAVENOUS | Status: DC | PRN
  Administered 2023-03-02 – 2023-03-20 (×4): 2 mg via INTRAVENOUS
  Filled 2023-03-01 (×4): qty 1

## 2023-03-01 MED ORDER — 0.9 % SODIUM CHLORIDE (POUR BTL) OPTIME
TOPICAL | Status: DC | PRN
  Administered 2023-03-01: 5000 mL

## 2023-03-01 MED ORDER — OXYCODONE HCL 5 MG PO TABS: 5.0000 mg | ORAL_TABLET | ORAL | Status: DC | PRN

## 2023-03-01 MED ORDER — ROCURONIUM BROMIDE 10 MG/ML (PF) SYRINGE
PREFILLED_SYRINGE | INTRAVENOUS | Status: DC | PRN
  Administered 2023-03-01 (×3): 100 mg via INTRAVENOUS
  Administered 2023-03-01: 80 mg via INTRAVENOUS

## 2023-03-01 MED ORDER — PROTAMINE SULFATE 10 MG/ML IV SOLN
INTRAVENOUS | Status: AC
  Filled 2023-03-01: qty 5

## 2023-03-01 MED ORDER — VANCOMYCIN HCL IN DEXTROSE 1-5 GM/200ML-% IV SOLN
1000.0000 mg | Freq: Once | INTRAVENOUS | Status: AC
Start: 2023-03-01 — End: 2023-03-01
  Administered 2023-03-01: 1000 mg via INTRAVENOUS
  Filled 2023-03-01: qty 200

## 2023-03-01 MED ORDER — SODIUM BICARBONATE 8.4 % IV SOLN
50.0000 meq | Freq: Once | INTRAVENOUS | Status: AC
  Administered 2023-03-01: 50 meq via INTRAVENOUS

## 2023-03-01 MED ORDER — SODIUM CHLORIDE 0.9 % IV SOLN
250.0000 mL | INTRAVENOUS | Status: AC
  Administered 2023-03-02: 250 mL via INTRAVENOUS

## 2023-03-01 MED ORDER — CALCIUM CHLORIDE 10 % IV SOLN
INTRAVENOUS | Status: AC
  Filled 2023-03-01: qty 10

## 2023-03-01 MED ORDER — CHLORHEXIDINE GLUCONATE 0.12 % MT SOLN
15.0000 mL | OROMUCOSAL | Status: AC
  Administered 2023-03-01: 15 mL via OROMUCOSAL
  Filled 2023-03-01: qty 15

## 2023-03-01 MED ORDER — PROTAMINE SULFATE 10 MG/ML IV SOLN
INTRAVENOUS | Status: DC | PRN
  Administered 2023-03-01: 50 mg via INTRAVENOUS
  Administered 2023-03-01: 180 mg via INTRAVENOUS

## 2023-03-01 MED ORDER — PANTOPRAZOLE SODIUM 40 MG PO TBEC: 40.0000 mg | DELAYED_RELEASE_TABLET | Freq: Every day | ORAL | Status: DC

## 2023-03-01 MED ORDER — FENTANYL BOLUS VIA INFUSION: 25.0000 ug | INTRAVENOUS | Status: DC | PRN

## 2023-03-01 MED ORDER — POTASSIUM CHLORIDE 10 MEQ/50ML IV SOLN: 10.0000 meq | INTRAVENOUS | Status: AC

## 2023-03-01 MED ORDER — CHLORHEXIDINE GLUCONATE CLOTH 2 % EX PADS
6.0000 | MEDICATED_PAD | Freq: Every day | CUTANEOUS | Status: DC
  Administered 2023-03-02 – 2023-03-20 (×19): 6 via TOPICAL

## 2023-03-01 MED ORDER — MIDAZOLAM HCL 2 MG/2ML IJ SOLN
2.0000 mg | INTRAMUSCULAR | Status: DC | PRN
  Administered 2023-03-01 – 2023-03-02 (×3): 2 mg via INTRAVENOUS
  Filled 2023-03-01 (×3): qty 2

## 2023-03-01 MED ORDER — METOPROLOL TARTRATE 12.5 MG HALF TABLET: 12.5000 mg | ORAL_TABLET | Freq: Two times a day (BID) | ORAL | Status: DC

## 2023-03-01 MED ORDER — SODIUM CHLORIDE 0.9% FLUSH
3.0000 mL | Freq: Two times a day (BID) | INTRAVENOUS | Status: DC
  Administered 2023-03-02 – 2023-03-04 (×5): 3 mL via INTRAVENOUS

## 2023-03-01 MED ORDER — MIDAZOLAM HCL 5 MG/5ML IJ SOLN
INTRAMUSCULAR | Status: DC | PRN
  Administered 2023-03-01: 5 mg via INTRAVENOUS

## 2023-03-01 MED ORDER — CALCIUM GLUCONATE-NACL 1-0.675 GM/50ML-% IV SOLN
INTRAVENOUS | Status: AC
  Filled 2023-03-01: qty 50

## 2023-03-01 MED ORDER — ALBUMIN HUMAN 5 % IV SOLN
INTRAVENOUS | Status: DC | PRN
  Administered 2023-03-02: 12.5 g via INTRAVENOUS

## 2023-03-01 MED ORDER — PHENYLEPHRINE 80 MCG/ML (10ML) SYRINGE FOR IV PUSH (FOR BLOOD PRESSURE SUPPORT): PREFILLED_SYRINGE | INTRAVENOUS | Status: DC | PRN

## 2023-03-01 MED ORDER — SODIUM CHLORIDE 0.9 % IV SOLN: INTRAVENOUS | Status: DC | PRN

## 2023-03-01 MED ORDER — METOCLOPRAMIDE HCL 5 MG/ML IJ SOLN
10.0000 mg | Freq: Four times a day (QID) | INTRAMUSCULAR | Status: AC
  Administered 2023-03-02 (×4): 10 mg via INTRAVENOUS
  Filled 2023-03-01 (×4): qty 2

## 2023-03-01 MED ORDER — SODIUM BICARBONATE 8.4 % IV SOLN
INTRAVENOUS | Status: DC
  Filled 2023-03-01 (×3): qty 25

## 2023-03-01 MED ORDER — SODIUM CHLORIDE 0.9 % IV SOLN
1.0000 g | Freq: Once | INTRAVENOUS | Status: AC
  Administered 2023-03-01: 1 g via INTRAVENOUS
  Filled 2023-03-01: qty 10

## 2023-03-01 MED ORDER — METOPROLOL TARTRATE 5 MG/5ML IV SOLN: 2.5000 mg | INTRAVENOUS | Status: DC | PRN

## 2023-03-01 MED ORDER — AMIODARONE HCL IN DEXTROSE 360-4.14 MG/200ML-% IV SOLN
INTRAVENOUS | Status: AC
  Filled 2023-03-01: qty 200

## 2023-03-01 MED ORDER — ASPIRIN 325 MG PO TBEC
325.0000 mg | DELAYED_RELEASE_TABLET | Freq: Every day | ORAL | Status: DC
Start: 2023-03-02 — End: 2023-03-02

## 2023-03-01 MED ORDER — SODIUM CHLORIDE 0.45 % IV SOLN: INTRAVENOUS | Status: AC | PRN

## 2023-03-01 MED ORDER — AMIODARONE HCL IN DEXTROSE 360-4.14 MG/200ML-% IV SOLN
60.0000 mg/h | INTRAVENOUS | Status: AC
  Administered 2023-03-01 (×2): 60 mg/h via INTRAVENOUS
  Filled 2023-03-01: qty 400

## 2023-03-01 MED ORDER — PROTAMINE SULFATE 10 MG/ML IV SOLN
25.0000 mg | Freq: Once | INTRAVENOUS | Status: AC
  Administered 2023-03-01: 25 mg via INTRAVENOUS
  Filled 2023-03-01: qty 5

## 2023-03-01 MED ORDER — ASPIRIN 81 MG PO CHEW: 324.0000 mg | CHEWABLE_TABLET | Freq: Every day | ORAL | Status: DC

## 2023-03-01 MED ORDER — ONDANSETRON HCL 4 MG/2ML IJ SOLN: 4.0000 mg | Freq: Four times a day (QID) | INTRAMUSCULAR | Status: DC | PRN

## 2023-03-01 MED ORDER — HEPARIN SODIUM (PORCINE) 1000 UNIT/ML IJ SOLN
INTRAMUSCULAR | Status: DC | PRN
  Administered 2023-03-01: 2000 [IU] via INTRAVENOUS
  Administered 2023-03-01: 7000 [IU] via INTRAVENOUS
  Administered 2023-03-01: 16000 [IU] via INTRAVENOUS

## 2023-03-01 MED ORDER — ROCURONIUM BROMIDE 10 MG/ML (PF) SYRINGE
PREFILLED_SYRINGE | INTRAVENOUS | Status: AC
  Filled 2023-03-01: qty 10

## 2023-03-01 MED ORDER — TRAMADOL HCL 50 MG PO TABS: 50.0000 mg | ORAL_TABLET | ORAL | Status: DC | PRN

## 2023-03-01 MED ORDER — MILRINONE LACTATE IN DEXTROSE 20-5 MG/100ML-% IV SOLN
0.2500 ug/kg/min | INTRAVENOUS | Status: DC
  Administered 2023-03-02 – 2023-03-12 (×14): 0.3 ug/kg/min via INTRAVENOUS
  Administered 2023-03-13: 0.25 ug/kg/min via INTRAVENOUS
  Filled 2023-03-01 (×16): qty 100

## 2023-03-01 MED ORDER — AMIODARONE HCL IN DEXTROSE 360-4.14 MG/200ML-% IV SOLN
30.0000 mg/h | INTRAVENOUS | Status: DC
Start: 2023-03-02 — End: 2023-03-11
  Administered 2023-03-02 – 2023-03-11 (×22): 30 mg/h via INTRAVENOUS
  Filled 2023-03-01 (×20): qty 200

## 2023-03-01 MED ORDER — PLASMA-LYTE A IV SOLN
INTRAVENOUS | Status: DC | PRN
  Administered 2023-03-01: 1000 mL via INTRAVASCULAR

## 2023-03-01 MED ORDER — CEFAZOLIN SODIUM-DEXTROSE 2-4 GM/100ML-% IV SOLN
2.0000 g | Freq: Three times a day (TID) | INTRAVENOUS | Status: AC
  Administered 2023-03-01 – 2023-03-03 (×6): 2 g via INTRAVENOUS
  Filled 2023-03-01 (×5): qty 100

## 2023-03-01 MED ORDER — FENTANYL CITRATE (PF) 250 MCG/5ML IJ SOLN
INTRAMUSCULAR | Status: DC | PRN
  Administered 2023-03-01: 75 ug via INTRAVENOUS
  Administered 2023-03-01: 25 ug via INTRAVENOUS
  Administered 2023-03-01 (×2): 100 ug via INTRAVENOUS
  Administered 2023-03-01: 150 ug via INTRAVENOUS

## 2023-03-01 MED ORDER — METOPROLOL TARTRATE 25 MG/10 ML ORAL SUSPENSION: 12.5000 mg | Freq: Two times a day (BID) | ORAL | Status: DC

## 2023-03-01 MED ORDER — SURGIFLO WITH THROMBIN (HEMOSTATIC MATRIX KIT) OPTIME
TOPICAL | Status: DC | PRN
  Administered 2023-03-01: 1 via TOPICAL

## 2023-03-01 MED ORDER — ETOMIDATE 2 MG/ML IV SOLN
INTRAVENOUS | Status: DC | PRN
  Administered 2023-03-01: 10 mg via INTRAVENOUS

## 2023-03-01 MED ORDER — EPHEDRINE SULFATE-NACL 50-0.9 MG/10ML-% IV SOSY
PREFILLED_SYRINGE | INTRAVENOUS | Status: DC | PRN
  Administered 2023-03-01: 5 mg via INTRAVENOUS
  Administered 2023-03-01: 2.5 mg via INTRAVENOUS

## 2023-03-01 MED ORDER — ROCURONIUM BROMIDE 10 MG/ML (PF) SYRINGE
PREFILLED_SYRINGE | INTRAVENOUS | Status: DC | PRN
  Administered 2023-03-01: 100 mg via INTRAVENOUS
  Administered 2023-03-02: 50 mg via INTRAVENOUS

## 2023-03-01 MED ORDER — SODIUM CHLORIDE 0.9 % IV SOLN: INTRAVENOUS | Status: AC

## 2023-03-01 MED ORDER — VASOPRESSIN 20 UNIT/ML IV SOLN
INTRAVENOUS | Status: DC | PRN
  Administered 2023-03-01: 1 [IU] via INTRAVENOUS

## 2023-03-01 MED ORDER — ALBUMIN HUMAN 5 % IV SOLN
250.0000 mL | INTRAVENOUS | Status: AC | PRN
  Administered 2023-03-01 (×5): 12.5 g via INTRAVENOUS
  Filled 2023-03-01 (×4): qty 250

## 2023-03-01 MED ORDER — DEXMEDETOMIDINE HCL IN NACL 400 MCG/100ML IV SOLN
0.0000 ug/kg/h | INTRAVENOUS | Status: DC
  Administered 2023-03-01: 0.7 ug/kg/h via INTRAVENOUS
  Administered 2023-03-02: 0.3 ug/kg/h via INTRAVENOUS
  Administered 2023-03-02: 0.7 ug/kg/h via INTRAVENOUS
  Filled 2023-03-01 (×4): qty 100

## 2023-03-01 MED ORDER — MIDAZOLAM HCL 2 MG/2ML IJ SOLN
INTRAMUSCULAR | Status: AC
  Filled 2023-03-01: qty 2

## 2023-03-01 MED ORDER — 0.9 % SODIUM CHLORIDE (POUR BTL) OPTIME
TOPICAL | Status: DC | PRN
  Administered 2023-03-01: 5000 mL
  Administered 2023-03-01 (×3): 1000 mL

## 2023-03-01 MED ORDER — HEPARIN 6000 UNIT IRRIGATION SOLUTION
Status: AC
  Filled 2023-03-01: qty 500

## 2023-03-01 MED ORDER — CEFAZOLIN SODIUM-DEXTROSE 2-3 GM-%(50ML) IV SOLR
INTRAVENOUS | Status: DC | PRN
  Administered 2023-03-01: 2 g via INTRAVENOUS

## 2023-03-01 MED ORDER — ALBUMIN HUMAN 5 % IV SOLN: INTRAVENOUS | Status: DC | PRN

## 2023-03-01 MED ORDER — ACETAMINOPHEN 160 MG/5ML PO SOLN
650.0000 mg | Freq: Once | ORAL | Status: AC
  Administered 2023-03-01: 650 mg
  Filled 2023-03-01: qty 20.3

## 2023-03-01 MED ORDER — EPINEPHRINE HCL 5 MG/250ML IV SOLN IN NS
0.0000 ug/min | INTRAVENOUS | Status: DC
Start: 2023-03-01 — End: 2023-03-04
  Administered 2023-03-02: 2 ug/min via INTRAVENOUS
  Filled 2023-03-01 (×2): qty 250

## 2023-03-01 MED ORDER — NOREPINEPHRINE 4 MG/250ML-% IV SOLN
0.0000 ug/min | INTRAVENOUS | Status: DC
  Administered 2023-03-01: 12 ug/min via INTRAVENOUS
  Administered 2023-03-02: 6 ug/min via INTRAVENOUS
  Administered 2023-03-02: 9 ug/min via INTRAVENOUS
  Administered 2023-03-02 – 2023-03-03 (×2): 6 ug/min via INTRAVENOUS
  Administered 2023-03-03: 4 ug/min via INTRAVENOUS
  Administered 2023-03-04: 5 ug/min via INTRAVENOUS
  Administered 2023-03-05: 7 ug/min via INTRAVENOUS
  Administered 2023-03-06 (×2): 6 ug/min via INTRAVENOUS
  Administered 2023-03-07: 8 ug/min via INTRAVENOUS
  Administered 2023-03-07: 10 ug/min via INTRAVENOUS
  Administered 2023-03-08: 2 ug/min via INTRAVENOUS
  Administered 2023-03-08: 12 ug/min via INTRAVENOUS
  Administered 2023-03-09: 2 ug/min via INTRAVENOUS
  Administered 2023-03-10 (×2): 5 ug/min via INTRAVENOUS
  Administered 2023-03-11 (×2): 6 ug/min via INTRAVENOUS
  Administered 2023-03-12 (×2): 5 ug/min via INTRAVENOUS
  Administered 2023-03-14 – 2023-03-15 (×2): 6 ug/min via INTRAVENOUS
  Administered 2023-03-15: 10 ug/min via INTRAVENOUS
  Filled 2023-03-01 (×23): qty 250

## 2023-03-01 MED ORDER — INSULIN REGULAR(HUMAN) IN NACL 100-0.9 UT/100ML-% IV SOLN
INTRAVENOUS | Status: DC
  Administered 2023-03-01: 0.5 [IU]/h via INTRAVENOUS

## 2023-03-01 MED ORDER — VASOPRESSIN 20 UNITS/100 ML INFUSION FOR SHOCK
INTRAVENOUS | Status: AC
  Filled 2023-03-01: qty 100

## 2023-03-01 SURGICAL SUPPLY — 115 items
ADAPTER CARDIO PERF ANTE/RETRO (ADAPTER) ×3 IMPLANT
ANTIFOG SOL W/FOAM PAD STRL (MISCELLANEOUS) ×3 IMPLANT
BAG DECANTER FOR FLEXI CONT (MISCELLANEOUS) ×3 IMPLANT
BLADE CLIPPER SURG (BLADE) ×3 IMPLANT
BLADE STERNUM SYSTEM 6 (BLADE) ×3 IMPLANT
BLADE SURG 15 STRL LF DISP TIS (BLADE) ×3 IMPLANT
BNDG ELASTIC 4INX 5YD STR LF (GAUZE/BANDAGES/DRESSINGS) IMPLANT
BNDG ELASTIC 6INX 5YD STR LF (GAUZE/BANDAGES/DRESSINGS) IMPLANT
BNDG GAUZE DERMACEA FLUFF 4 (GAUZE/BANDAGES/DRESSINGS) ×3 IMPLANT
CANISTER SUCT 3000ML PPV (MISCELLANEOUS) ×3 IMPLANT
CANNULA ARTERIAL NVNT 3/8 22FR (MISCELLANEOUS) IMPLANT
CANNULA EZ GLIDE AORTIC 21FR (CANNULA) ×3 IMPLANT
CANNULA GUNDRY RCSP 15FR (MISCELLANEOUS) IMPLANT
CANNULA MC2 2 STG 36/46 NON-V (CANNULA) IMPLANT
CANNULA SUMP PERICARDIAL (CANNULA) ×3 IMPLANT
CATH CPB KIT HENDRICKSON (MISCELLANEOUS) ×3 IMPLANT
CATH INFINITI 5FR ANG PIGTAIL (CATHETERS) IMPLANT
CATH ROBINSON RED A/P 18FR (CATHETERS) ×3 IMPLANT
CATH THORACIC 36FR (CATHETERS) ×3 IMPLANT
CATH THORACIC 36FR RT ANG (CATHETERS) ×3 IMPLANT
CLIP FOGARTY SPRING 6M (CLIP) IMPLANT
CLIP TI MEDIUM 24 (CLIP) IMPLANT
CLIP TI WIDE RED SMALL 24 (CLIP) IMPLANT
CNTNR URN SCR LID CUP LEK RST (MISCELLANEOUS) IMPLANT
CONN 1/2X1/2X1/2 BEN (MISCELLANEOUS) ×3 IMPLANT
CONN ST 3/8 X 1/2 (MISCELLANEOUS) ×6 IMPLANT
CONN Y 3/8X3/8X3/8 BEN (MISCELLANEOUS) IMPLANT
CONTAINER PROTECT SURGISLUSH (MISCELLANEOUS) ×6 IMPLANT
DERMABOND ADVANCED .7 DNX12 (GAUZE/BANDAGES/DRESSINGS) IMPLANT
DRAIN CHANNEL 28F RND 3/8 FF (WOUND CARE) IMPLANT
DRAPE SRG 135X102X78XABS (DRAPES) ×3 IMPLANT
DRAPE WARM FLUID 44X44 (DRAPES) ×3 IMPLANT
DRSG COVADERM 4X14 (GAUZE/BANDAGES/DRESSINGS) ×3 IMPLANT
DRSG TEGADERM 4X4.5 CHG (GAUZE/BANDAGES/DRESSINGS) IMPLANT
ELECT CAUTERY BLADE 6.4 (BLADE) IMPLANT
ELECT REM PT RETURN 9FT ADLT (ELECTROSURGICAL) ×6 IMPLANT
ELECTRODE REM PT RTRN 9FT ADLT (ELECTROSURGICAL) ×6 IMPLANT
FELT TEFLON 1X6 (MISCELLANEOUS) ×6 IMPLANT
GAUZE SPONGE 4X4 12PLY STRL (GAUZE/BANDAGES/DRESSINGS) ×6 IMPLANT
GLOVE SS BIOGEL STRL SZ 7.5 (GLOVE) ×3 IMPLANT
GLOVE SURG SIGNA 7.5 PF LTX (GLOVE) ×9 IMPLANT
GOWN STRL REUS W/ TWL LRG LVL3 (GOWN DISPOSABLE) ×12 IMPLANT
GOWN STRL REUS W/ TWL XL LVL3 (GOWN DISPOSABLE) ×6 IMPLANT
GRAFT VASC STRG 30X10STRL (Graft) IMPLANT
HEMOSTAT POWDER SURGIFOAM 1G (HEMOSTASIS) ×9 IMPLANT
HEMOSTAT SURGICEL 2X14 (HEMOSTASIS) ×3 IMPLANT
INSERT FOGARTY SM (MISCELLANEOUS) IMPLANT
INSERT FOGARTY XLG (MISCELLANEOUS) IMPLANT
IV CATH 22GX1 FEP (IV SOLUTION) IMPLANT
KIT BASIN OR (CUSTOM PROCEDURE TRAY) ×3 IMPLANT
KIT SUCTION CATH 14FR (SUCTIONS) ×6 IMPLANT
KIT TURNOVER KIT B (KITS) ×3 IMPLANT
KIT VASOVIEW HEMOPRO 2 VH 4000 (KITS) ×3 IMPLANT
LOOP VASCLR EXTRA MAXI WHITE (MISCELLANEOUS) IMPLANT
LOOPS VASCLR EXTRA MAXI WHITE (MISCELLANEOUS) ×3 IMPLANT
MARKER GRAFT CORONARY BYPASS (MISCELLANEOUS) ×9 IMPLANT
NS IRRIG 1000ML POUR BTL (IV SOLUTION) ×15 IMPLANT
PACK E OPEN HEART (SUTURE) ×3 IMPLANT
PACK OPEN HEART (CUSTOM PROCEDURE TRAY) ×3 IMPLANT
PAD ARMBOARD 7.5X6 YLW CONV (MISCELLANEOUS) ×6 IMPLANT
PAD ELECT DEFIB RADIOL ZOLL (MISCELLANEOUS) ×3 IMPLANT
PENCIL BUTTON HOLSTER BLD 10FT (ELECTRODE) ×3 IMPLANT
POSITIONER HEAD DONUT 9IN (MISCELLANEOUS) ×3 IMPLANT
PUMP SET IMPELLA 5.5 US (CATHETERS) IMPLANT
PUNCH AORTIC ROTATE 4.0MM (MISCELLANEOUS) IMPLANT
PUNCH AORTIC ROTATE 4.5MM 8IN (MISCELLANEOUS) IMPLANT
PUNCH AORTIC ROTATE 5MM 8IN (MISCELLANEOUS) IMPLANT
SET MPS 3-ND DEL (MISCELLANEOUS) IMPLANT
SOLUTION ANTFG W/FOAM PAD STRL (MISCELLANEOUS) IMPLANT
SPONGE T-LAP 18X18 ~~LOC~~+RFID (SPONGE) ×12 IMPLANT
SPONGE T-LAP 4X18 ~~LOC~~+RFID (SPONGE) ×3 IMPLANT
STOPCOCK 4 WAY LG BORE MALE ST (IV SETS) IMPLANT
SUPPORT HEART JANKE-BARRON (MISCELLANEOUS) ×3 IMPLANT
SUT BONE WAX W31G (SUTURE) ×3 IMPLANT
SUT EB EXC GRN/WHT 2-0 D/A SH (SUTURE) ×3 IMPLANT
SUT ETHIBOND 2 0 SH 36X2 (SUTURE) ×6 IMPLANT
SUT ETHIBOND 2 0 V4 (SUTURE) IMPLANT
SUT ETHIBOND 2 0V4 GREEN (SUTURE) IMPLANT
SUT MNCRL AB 4-0 PS2 18 (SUTURE) IMPLANT
SUT PROLENE 3 0 SH DA (SUTURE) ×3 IMPLANT
SUT PROLENE 3 0 SH1 36 (SUTURE) ×3 IMPLANT
SUT PROLENE 4 0 SH DA (SUTURE) IMPLANT
SUT PROLENE 4-0 RB1 .5 CRCL 36 (SUTURE) ×6 IMPLANT
SUT PROLENE 5 0 C 1 36 (SUTURE) ×6 IMPLANT
SUT PROLENE 5 0 CC1 (SUTURE) ×3 IMPLANT
SUT PROLENE 6 0 C 1 30 (SUTURE) ×6 IMPLANT
SUT PROLENE 7 0 BV1 MDA (SUTURE) ×3 IMPLANT
SUT PROLENE 8 0 BV175 6 (SUTURE) IMPLANT
SUT SILK 1 MH (SUTURE) ×6 IMPLANT
SUT SILK 1 TIES 10X30 (SUTURE) ×3 IMPLANT
SUT SILK 2 0 SH CR/8 (SUTURE) ×6 IMPLANT
SUT SILK 2-0 18XBRD TIE 12 (SUTURE) ×3 IMPLANT
SUT SILK 3 0 SH CR/8 (SUTURE) ×3 IMPLANT
SUT SILK 4-0 18XBRD TIE 12 (SUTURE) ×3 IMPLANT
SUT STEEL 6MS V (SUTURE) ×3 IMPLANT
SUT STEEL STERNAL CCS#1 18IN (SUTURE) IMPLANT
SUT STEEL SZ 6 DBL 3X14 BALL (SUTURE) ×3 IMPLANT
SUT TEM PAC WIRE 2 0 SH (SUTURE) ×12 IMPLANT
SUT VIC AB 1 CTX36XBRD ANBCTR (SUTURE) ×6 IMPLANT
SUT VIC AB 2-0 CT1 TAPERPNT 27 (SUTURE) IMPLANT
SUT VIC AB 2-0 CTX 27 (SUTURE) IMPLANT
SUT VIC AB 3-0 SH 27X BRD (SUTURE) IMPLANT
SUT VIC AB 3-0 X1 27 (SUTURE) IMPLANT
SUTURE EB EXC GRN/WHT 2-0 D/A (SUTURE) ×3 IMPLANT
SYSTEM SAHARA CHEST DRAIN ATS (WOUND CARE) ×3 IMPLANT
TAPE CLOTH SURG 4X10 WHT LF (GAUZE/BANDAGES/DRESSINGS) IMPLANT
TAPE PAPER 2X10 WHT MICROPORE (GAUZE/BANDAGES/DRESSINGS) IMPLANT
TIE VASCULAR EXTRA MAXI WHITE (MISCELLANEOUS) ×3 IMPLANT
TOWEL GREEN STERILE (TOWEL DISPOSABLE) ×3 IMPLANT
TOWEL GREEN STERILE FF (TOWEL DISPOSABLE) ×3 IMPLANT
TRAY FOLEY SLVR 16FR TEMP STAT (SET/KITS/TRAYS/PACK) ×3 IMPLANT
TUBING LAP HI FLOW INSUFFLATIO (TUBING) ×3 IMPLANT
UNDERPAD 30X36 HEAVY ABSORB (UNDERPADS AND DIAPERS) ×3 IMPLANT
VASCULAR TIE EXTRA MAXI WHITE (MISCELLANEOUS) ×3
WATER STERILE IRR 1000ML POUR (IV SOLUTION) ×6 IMPLANT

## 2023-03-01 SURGICAL SUPPLY — 81 items
ADAPTER CARDIO PERF ANTE/RETRO (ADAPTER) IMPLANT
APPLIER CLIP 9.375 MED OPEN (MISCELLANEOUS) IMPLANT
APPLIER CLIP 9.375 SM OPEN (CLIP) IMPLANT
BAG DECANTER FOR FLEXI CONT (MISCELLANEOUS) ×1 IMPLANT
BLADE CLIPPER SURG (BLADE) ×1 IMPLANT
BLADE SURG 11 STRL SS (BLADE) IMPLANT
CANISTER SUCT 3000ML PPV (MISCELLANEOUS) ×1 IMPLANT
CANNULA GUNDRY RCSP 15FR (MISCELLANEOUS) IMPLANT
CATH THORACIC 28FR (CATHETERS) IMPLANT
CLIP APPLIE 9.375 MED OPEN (MISCELLANEOUS) IMPLANT
CLIP APPLIE 9.375 SM OPEN (CLIP) IMPLANT
CLIP FOGARTY SPRING 6M (CLIP) IMPLANT
CLIP TI MEDIUM 24 (CLIP) IMPLANT
CLIP TI MEDIUM 6 (CLIP) IMPLANT
CLIP TI WIDE RED SMALL 24 (CLIP) IMPLANT
CONN Y 3/8X3/8X3/8 BEN (MISCELLANEOUS) IMPLANT
CONTAINER PROTECT SURGISLUSH (MISCELLANEOUS) ×1 IMPLANT
COVER SURGICAL LIGHT HANDLE (MISCELLANEOUS) IMPLANT
DRAPE SRG 135X102X78XABS (DRAPES) ×1 IMPLANT
DRSG COVADERM 4X14 (GAUZE/BANDAGES/DRESSINGS) ×1 IMPLANT
ELECT CAUTERY BLADE 6.4 (BLADE) IMPLANT
ELECT REM PT RETURN 9FT ADLT (ELECTROSURGICAL) ×2 IMPLANT
ELECTRODE REM PT RTRN 9FT ADLT (ELECTROSURGICAL) ×2 IMPLANT
GAUZE 4X4 16PLY ~~LOC~~+RFID DBL (SPONGE) ×1 IMPLANT
GAUZE SPONGE 4X4 12PLY STRL (GAUZE/BANDAGES/DRESSINGS) ×2 IMPLANT
GLOVE BIO SURGEON STRL SZ 6 (GLOVE) IMPLANT
GLOVE BIO SURGEON STRL SZ 6.5 (GLOVE) IMPLANT
GLOVE BIO SURGEON STRL SZ7 (GLOVE) IMPLANT
GLOVE BIO SURGEON STRL SZ7.5 (GLOVE) IMPLANT
GLOVE SKINSENSE STRL SZ6.5 (GLOVE) IMPLANT
GLOVE SS BIOGEL STRL SZ 7.5 (GLOVE) ×1 IMPLANT
GLOVE SURG SIGNA 7.5 PF LTX (GLOVE) ×2 IMPLANT
GOWN STRL REUS W/ TWL LRG LVL3 (GOWN DISPOSABLE) ×3 IMPLANT
GOWN STRL REUS W/ TWL XL LVL3 (GOWN DISPOSABLE) ×2 IMPLANT
HEMOSTAT POWDER SURGIFOAM 1G (HEMOSTASIS) IMPLANT
HEMOSTAT SURGICEL 2X14 (HEMOSTASIS) IMPLANT
INSERT FOGARTY XLG (MISCELLANEOUS) IMPLANT
KIT BASIN OR (CUSTOM PROCEDURE TRAY) ×1 IMPLANT
KIT SUCTION CATH 14FR (SUCTIONS) IMPLANT
KIT TURNOVER KIT B (KITS) ×1 IMPLANT
LINE VENT (MISCELLANEOUS) IMPLANT
NS IRRIG 1000ML POUR BTL (IV SOLUTION) ×5 IMPLANT
PACK CHEST (CUSTOM PROCEDURE TRAY) ×1 IMPLANT
PACK E OPEN HEART (SUTURE) IMPLANT
PAD ARMBOARD 7.5X6 YLW CONV (MISCELLANEOUS) ×2 IMPLANT
PAD ELECT DEFIB RADIOL ZOLL (MISCELLANEOUS) ×1 IMPLANT
POSITIONER HEAD DONUT 9IN (MISCELLANEOUS) ×1 IMPLANT
POWDER SURGICEL 3.0 GRAM (HEMOSTASIS) IMPLANT
SEALANT PATCH FIBRIN 2X4IN (MISCELLANEOUS) IMPLANT
SET CARDIOPLEGIA MPS 5001102 (MISCELLANEOUS) IMPLANT
SPONGE T-LAP 18X18 ~~LOC~~+RFID (SPONGE) ×4 IMPLANT
SPONGE T-LAP 4X18 ~~LOC~~+RFID (SPONGE) ×1 IMPLANT
STAPLER VISISTAT (STAPLE) IMPLANT
SURGIFLO W/THROMBIN 8M KIT (HEMOSTASIS) IMPLANT
SUT ETHIBOND 2 0 SH 36X2 (SUTURE) IMPLANT
SUT MNCRL AB 4-0 PS2 18 (SUTURE) IMPLANT
SUT PROLENE 3 0 SH 1 (SUTURE) IMPLANT
SUT PROLENE 4-0 RB1 .5 CRCL 36 (SUTURE) IMPLANT
SUT PROLENE 5 0 C 1 36 (SUTURE) IMPLANT
SUT PROLENE 6 0 C 1 30 (SUTURE) IMPLANT
SUT PROLENE 7 0 BV 1 (SUTURE) IMPLANT
SUT PROLENE 7 0 BV1 MDA (SUTURE) IMPLANT
SUT PROLENE 8 0 BV175 6 (SUTURE) IMPLANT
SUT SILK 1 MH (SUTURE) ×1 IMPLANT
SUT STEEL 6MS V (SUTURE) IMPLANT
SUT STEEL STERNAL CCS#1 18IN (SUTURE) IMPLANT
SUT STEEL SZ 6 DBL 3X14 BALL (SUTURE) IMPLANT
SUT VIC AB 1 CTX36XBRD ANBCTR (SUTURE) ×2 IMPLANT
SUT VIC AB 2-0 CT1 36 (SUTURE) IMPLANT
SUT VIC AB 2-0 CTX 27 (SUTURE) ×2 IMPLANT
SUT VIC AB 3-0 SH 27X BRD (SUTURE) IMPLANT
SUT VIC AB 3-0 X1 27 (SUTURE) ×2 IMPLANT
SUT VICRYL 4-0 PS2 18IN ABS (SUTURE) IMPLANT
SYSTEM SAHARA CHEST DRAIN ATS (WOUND CARE) IMPLANT
TAPE PAPER 2X10 WHT MICROPORE (GAUZE/BANDAGES/DRESSINGS) IMPLANT
TAPE PAPER 3X10 WHT MICROPORE (GAUZE/BANDAGES/DRESSINGS) IMPLANT
TOWEL GREEN STERILE (TOWEL DISPOSABLE) ×1 IMPLANT
TRAY CATH LUMEN 1 20CM STRL (SET/KITS/TRAYS/PACK) IMPLANT
TUBING LAP HI FLOW INSUFFLATIO (TUBING) IMPLANT
UNDERPAD 30X36 HEAVY ABSORB (UNDERPADS AND DIAPERS) ×1 IMPLANT
WATER STERILE IRR 1000ML POUR (IV SOLUTION) ×2 IMPLANT

## 2023-03-01 NOTE — Anesthesia Procedure Notes (Signed)
Central Venous Catheter Insertion Performed by: Gaynelle Adu, MD, anesthesiologist Start/End12/19/2024 6:55 AM, 03/01/2023 7:15 AM Patient location: Pre-op. Preanesthetic checklist: patient identified, IV checked, site marked, risks and benefits discussed, surgical consent, monitors and equipment checked, pre-op evaluation, timeout performed and anesthesia consent Hand hygiene performed  and maximum sterile barriers used  PA cath was placed.Swan type:thermodilution PA Cath depth:50 Procedure performed without using ultrasound guided technique. Attempts: 1 Patient tolerated the procedure well with no immediate complications.

## 2023-03-01 NOTE — Transfer of Care (Signed)
Immediate Anesthesia Transfer of Care Note  Patient: KOMARI PRZYWARA  Procedure(s) Performed: CORONARY ARTERY BYPASS GRAFTING X 5, USING LEFT INTERNAL MAMMARY ARTERY AND ENDOSCOPICALLY HARVESTED RIGHT SAPHENOUS VEIN GRAFT (Chest) TRANSESOPHAGEAL ECHOCARDIOGRAM (TEE) PLACEMENT OF IMPELLA LEFT VENTRICULAR ASSIST DEVICE  Patient Location: SICU  Anesthesia Type:General  Level of Consciousness: sedated and Patient remains intubated per anesthesia plan  Airway & Oxygen Therapy: Patient remains intubated per anesthesia plan and Patient placed on Ventilator (see vital sign flow sheet for setting)  Post-op Assessment: Report given to RN and Post -op Vital signs reviewed and stable  Post vital signs: Reviewed and stable  Last Vitals:  Vitals Value Taken Time  BP 98/82 03/01/23 1526  Temp 36.5 C 03/01/23 1535  Pulse 109 03/01/23 1525  Resp 16 03/01/23 1535  SpO2 94 % 03/01/23 1525  Vitals shown include unfiled device data.  Last Pain:  Vitals:   03/01/23 0637  TempSrc: Oral  PainSc:       Patients Stated Pain Goal: 0 (02/24/23 1914)  Complications: No notable events documented.

## 2023-03-01 NOTE — Discharge Summary (Incomplete)
[address]       [place] [address]             [phone]    Physician Discharge Summary  Patient ID: Walter Vargas MRN: [id number] DOB/AGE: 77/25/1947 77 y.o.  Admit date: 02/14/2023 Discharge date: 03/02/2023  Admission Diagnoses:  Patient Active Problem List   Diagnosis Date Noted   Cardiogenic shock (HCC) 03/01/2023   COVID-19 virus infection 02/18/2023   Heart failure (HCC) 02/14/2023   AKI (acute kidney injury) (HCC) 02/13/2023   Acute exacerbation of CHF (congestive heart failure) (HCC) 02/10/2023   Non-ST elevation (NSTEMI) myocardial infarction (HCC) 02/10/2023   Thickening of wall of gallbladder 02/10/2023   Elevated LFTs 02/10/2023   Chronic diastolic congestive heart failure (HCC) 02/05/2023   Pleural effusion 10/19/2022   Elevated brain natriuretic peptide (BNP) level 10/19/2022   Dyspnea on exertion 10/12/2022   Lower extremity edema 10/12/2022   Elevated blood pressure reading 06/21/2022   Callus 06/21/2022   Umbilical hernia without obstruction and without gangrene 06/21/2022   Impacted cerumen of right ear 05/26/2021   Chronic pain of both knees 05/26/2021   Preventative health care 05/26/2021   Screening for colon cancer 05/26/2021     Discharge Diagnoses:  Patient Active Problem List   Diagnosis Date Noted   Cardiogenic shock (HCC) 03/01/2023   COVID-19 virus infection 02/18/2023   Heart failure (HCC) 02/14/2023   AKI (acute kidney injury) (HCC) 02/13/2023   Acute exacerbation of CHF (congestive heart failure) (HCC) 02/10/2023   Non-ST elevation (NSTEMI) myocardial infarction (HCC) 02/10/2023   Thickening of wall of gallbladder 02/10/2023   Elevated LFTs 02/10/2023   Chronic diastolic congestive heart failure (HCC) 02/05/2023   Pleural effusion 10/19/2022   Elevated brain natriuretic peptide (BNP) level 10/19/2022   Dyspnea on exertion 10/12/2022   Lower extremity edema 10/12/2022   Elevated blood pressure reading  06/21/2022   Callus 06/21/2022   Umbilical hernia without obstruction and without gangrene 06/21/2022   Impacted cerumen of right ear 05/26/2021   Chronic pain of both knees 05/26/2021   Preventative health care 05/26/2021   Screening for colon cancer 05/26/2021     Discharged Condition: Stable  History of Present Illness:  Walter Vargas. 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  He has a history of known congestive heart failure and cardiomyopathy based on echocardiogram in early November of this year.  His ejection fraction was estimated at less than 20%.  He was referred to cardiologist but delayed follow-up.  Walter Vargas son came into town last week with plans to meet him for breakfast but Walter Vargas did not arrive is planned.  When the son went to Walter Vargas's home to check on him, he found him confused and short of breath.  Stryffeler was taken to the emergency room at [facility] where he firmed having shortness of breath with activity for the previous couple of months along with vague chest and abdominal pain.  In the ED, his vital signs were within normal limits and he was in a stable sinus rhythm.  Physical exam was notable labored respirations and lower extremity pitting edema.  Initial high-sensitivity troponin was greater than 13,000 and BNP was greater than 4500.  EKG showed left bundle branch block with no acute ST changes.  CT head scan was  done due to the recent onset of confusion which was a new symptom.  CT head showed no acute abnormality.  CTA of the chest was notable for pulmonary edema and small pleural effusions.  CT of the abdomen and pelvis showed a distended gallbladder but was otherwise unremarkable.  This finding was followed up with an eminent an abdominal ultrasound showing thickened gallbladder wall  with stones and sludge but no ductal dilatation.  A subsequent HIDA scan has ruled out cholecystitis.   [name] was admitted to the hospital with acute non-ST elevation myocardial infarction in the setting of acute on chronic congestive heart failure.  He was started on aspirin, heparin infusion, and was diuresed with double improvement of his breathing and resolution of lower extremity edema.  Cardiology left heart catheterization was recommended and completed yesterday showing severe three-vessel coronary artery disease repeat transthoracic echocardiogram also obtained yesterday confirmed left ventricular ejection fraction of less than 20% with global hypokinesis and mild concentric LV hypertrophy.  RV function was moderately reduced.  There was moderate mitral insufficiency and moderate tricuspid insufficiency.  No significant AI or AS.  Pulmonary artery systolic pressure was elevated with an estimated RV systolic pressure of 66 mmHg.  Hospital Course:  He was evaluated by Dr. Dorris Fetch who recommended coronary bypass grafting with possible intervention on his Mitral Valve.  The risks and benefits of the procedure were explained to the patient and he was agreeable to proceed.  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  He was symptom free from this.  Vascular surgery was consulted and felt this could be dealt with once he recovered from his surgery.  He also tested positive for COVID-19 and was treated with Paxlovid.  He has since recovered and was felt stable to proceed with his surgical procedure.  He was taken to the operating room on 03/01/2023.  He underwent CABG x 5 and endoscopic harvest of the greater saphenous vein from his right leg and an Impella 5.5.  He tolerated the procedure without difficulty and was taken to the SICU in stable condition. He returned to the OR after midnight for exploration for bleeding. He was transported back to  [facility] ICU and remained intubated. Will try to wean and extubate later today. He is on Epineprhine, Nor epinephrine, Milrinone, and Vasopressin drips. He had anemia and thrombocytopenia post op. Advanced heart failure continued to follow post op.  Consults:  Advanced heart failure  Significant Diagnostic Studies:   Narrative & Impression  CLINICAL DATA:  Status post coronary artery bypass graft.   EXAM: PORTABLE CHEST 1 VIEW   COMPARISON:  Same day.   FINDINGS: Stable cardiomediastinal silhouette. Impella device is unchanged in position. Right internal jugular Swan-Ganz catheter is unchanged. Endotracheal tube is unchanged. Distal tip of nasogastric tube is seen in expected position of cavoatrial junction. Left-sided chest tube is noted without pneumothorax. Bibasilar atelectasis is noted. Small right pleural effusion.   IMPRESSION: Nasogastric tube tip seen at expected position of gastroesophageal junction; advancement is recommended. Otherwise stable support apparatus. Left-sided chest tube without pneumothorax. Bibasilar subsegmental atelectasis. Small right pleural effusion.     Electronically Signed   By: Lupita Raider M.D.   On: 03/02/2023 09:45    Treatments: surgery: ***   Discharge Exam: Blood pressure (!) 103/90, pulse 81, temperature 99.7 F (37.6 C), resp. rate 20, height 5\' 8"  (1.727 m), weight 67.1 kg, SpO2 99%. {physical [id number]   Discharge Medications:  The patient has been discharged on:   1.Beta Blocker:  Yes [   ]                              No   [   ]                              If No, reason:  2.Ace Inhibitor/ARB: Yes [   ]                                     No  [    ]                                     If No, reason:  3.Statin:   Yes [   ]                  No  [   ]                  If No, reason:  4.Ecasa:  Yes  [   ]                  No   [   ]                  If No, reason:  Patient had ACS upon  admission:  Plavix/P2Y12 inhibitor: Yes [   ]                                      No  [   ]     Discharge Instructions     AMB Referral to Cardiac Rehabilitation - Phase II   Complete by: As directed    Diagnosis: CABG   CABG X ___: 5   After initial evaluation and assessments completed: Virtual Based Care may be provided alone or in conjunction with Phase 2 Cardiac Rehab based on patient barriers.: Yes   Intensive Cardiac Rehabilitation (ICR) [facility] location only OR Traditional Cardiac Rehabilitation (TCR) *If criteria for ICR are not met will enroll in TCR [facility] only): Yes      Allergies as of 03/02/2023   No Active Allergies   Med Rec must be completed prior to using this [facility]***        Durable Medical Equipment  (From admission, onward)           Start     Ordered   02/20/23 1715  For home use only DME 4 wheeled rolling walker with seat  Once       Question Answer Comment  Patient needs a walker to treat with the following condition Physical deconditioning   Patient needs a walker to treat with the following condition Heart failure (HCC)      02/20/23 1719            Follow-up Information     [facility] Follow up.   Specialty: Cardiology Why: 02/28/23 at 2:00 PM   For pre surgical ultrasounds and labs. Arrive to [facility] ([facility] [place]  [facility], Valet parking avaible) Solicitor information: [address] [place] [address] [phone]        [facility]. Go on 04/03/2023.   Why: Please arrive by 8:15 am in order to have PA/LAT CXR taken PRIOR to office appointment Contact information: [address] [place] [address]        [name], MD. Go on 04/03/2023.   Specialty: Cardiothoracic Surgery Why: Appointment time is at 9:15 am Contact information: [address] [place]  [address] [phone]                 Signed:  Ardelle Balls, PA-C 03/02/2023, 10:20 AM

## 2023-03-01 NOTE — Hospital Course (Addendum)
History of Present Illness:  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  He has a history of known congestive heart failure and cardiomyopathy based on echocardiogram in early November of this year.  His ejection fraction was estimated at less than 20%.  He was referred to cardiologist but delayed follow-up.  Walter Vargas son came into town last week with plans to meet him for breakfast but Walter Vargas did not arrive is planned.  When the son went to Walter Vargas's home to check on him, he found him confused and short of breath.  Walter Vargas was taken to the emergency room at [facility] where he firmed having shortness of breath with activity for the previous couple of months along with vague chest and abdominal pain.  In the ED, his vital signs were within normal limits and he was in a stable sinus rhythm.  Physical exam was notable labored respirations and lower extremity pitting edema.  Initial high-sensitivity troponin was greater than 13,000 and BNP was greater than 4500.  EKG showed left bundle branch block with no acute ST changes.  CT head scan was done due to the recent onset of confusion which was a new symptom.  CT head showed no acute abnormality.  CTA of the chest was notable for pulmonary edema and small pleural effusions.  CT of the abdomen and pelvis showed a distended gallbladder but was otherwise unremarkable.  This finding was followed up with an eminent an abdominal ultrasound showing thickened gallbladder wall with stones and sludge but no ductal dilatation.  A subsequent HIDA scan has ruled out cholecystitis.   Walter Vargas was admitted to the hospital with acute non-ST elevation myocardial infarction in the setting of acute on chronic congestive heart failure.  He was started on aspirin, heparin infusion, and was  diuresed with double improvement of his breathing and resolution of lower extremity edema.  Cardiology left heart catheterization was recommended and completed yesterday showing severe three-vessel coronary artery disease repeat transthoracic echocardiogram also obtained yesterday confirmed left ventricular ejection fraction of less than 20% with global hypokinesis and mild concentric LV hypertrophy.  RV function was moderately reduced.  There was moderate mitral insufficiency and moderate tricuspid insufficiency.  No significant AI or AS.  Pulmonary artery systolic pressure was elevated with an estimated RV systolic pressure of 66 mmHg.  Hospital Course:  He was evaluated by Dr. Dorris Fetch who recommended coronary bypass grafting with possible intervention on his Mitral Valve.  The risks and benefits of the procedure were explained to the patient and he was agreeable to proceed.  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  He was symptom free from this.  Vascular surgery was consulted and felt this could be dealt with once he recovered from his surgery.  He also tested positive for COVID-19 and was treated with Paxlovid.  He has since recovered and was felt stable to proceed with his surgical procedure.  He was taken to the operating room on 03/01/2023.  He underwent CABG x 5 and endoscopic harvest of the greater saphenous vein from his right leg and an Impella 5.5.  He tolerated the procedure without difficulty and was taken to the SICU in stable condition. He returned to the OR after midnight for exploration for bleeding. He was transported back to  2H ICU and remained intubated. Will try to wean and extubate later today. He is on Epineprhine, Nor epinephrine, Milrinone, and Vasopressin drips. He had anemia and thrombocytopenia post op and was not started on Lovenox. Advanced heart failure continued to follow post op.  He underwent placement of a Cortrak  feeding tube and feedings were initiated.  He was extubated the evening of POD #1.  He was transfused for post operative blood loss anemia with Hgb of 7.6.  His chest tubes and pacing wires were removed without difficulty.  He was started on lasix to help facilitate diuresis.  SLP evaluation was obtained and they recommended permitting a dysphagia I diet since there was no evidence of aspiration.  The patient had issues with delirium baseline and experienced increased episodes post surgery.  This was treated with Precedex during severe episodes and he was also started on Seroquel.  His mental status gradually returned to baseline with intermittent confusion. Post operative thrombocytopenia improved and his hematocrit remaine relatively stable. The vasopressor supporg was gradually weaned off by post-op day 8 and the Impella 5.5 support was also gradually decreased.  After discussion with the advance heart failure team, the decision was made to remove the Impella on 12/30. Impella was reduced to P4 and hemodynamics remained stable on milrinone, norepi and Amiodarone. Milrinone drip was continued and NE was weaned as BP allowed. Impella was weaned and removed without complication on 12/30. His confusion remained stable on Seroquel and empiricThiamine was also added.

## 2023-03-01 NOTE — Progress Notes (Signed)
PT Cancellation Note  Patient Details Name: KRISTIEN MEHAFFEY MRN: [id number] DOB: 01-29-1946   Cancelled Treatment:    Reason Eval/Treat Not Completed: Patient at procedure or test/unavailable (Pt to OR this morning for CABG. Will follow up at later date/time for Re-eval as pt able/appropriate and schedule allows.)   Wynn Maudlin, DPT Acute Rehabilitation Services Office [phone]  03/01/23 9:38 AM

## 2023-03-01 NOTE — Anesthesia Procedure Notes (Signed)
Procedure Name: Intubation Date/Time: 03/01/2023 8:24 AM  Performed by: April Holding, CRNAPre-anesthesia Checklist: Patient identified, Emergency Drugs available, Suction available and Patient being monitored Patient Re-evaluated:Patient Re-evaluated prior to induction Oxygen Delivery Method: Circle System Utilized Preoxygenation: Pre-oxygenation with 100% oxygen Induction Type: IV induction Ventilation: Mask ventilation without difficulty Laryngoscope Size: Miller and 2 Grade View: Grade I Tube type: Oral Tube size: 8.0 mm Number of attempts: 1 Airway Equipment and Method: Stylet and Oral airway Placement Confirmation: ETT inserted through vocal cords under direct vision, positive ETCO2 and breath sounds checked- equal and bilateral Secured at: 22 cm Tube secured with: Tape Dental Injury: Teeth and Oropharynx as per pre-operative assessment

## 2023-03-01 NOTE — Anesthesia Procedure Notes (Signed)
Arterial Line Insertion Start/End12/19/2024 7:15 AM, 03/01/2023 7:25 AM Performed by: Gaynelle Adu, MD, anesthesiologist  Patient location: Pre-op. Preanesthetic checklist: patient identified, IV checked, site marked, risks and benefits discussed, surgical consent, monitors and equipment checked, pre-op evaluation, timeout performed and anesthesia consent Lidocaine 1% used for infiltration Left, brachial was placed Catheter size: 20 G Hand hygiene performed  and maximum sterile barriers used   Attempts: 1 Procedure performed using ultrasound guided technique. Ultrasound Notes:anatomy identified, needle tip was noted to be adjacent to the nerve/plexus identified, no ultrasound evidence of intravascular and/or intraneural injection and image(s) printed for medical record Following insertion, dressing applied, Biopatch and line sutured. Post procedure assessment: normal and unchanged  Post procedure complications: unsuccessful attempts and second provider assisted. Patient tolerated the procedure well with no immediate complications.

## 2023-03-01 NOTE — Anesthesia Postprocedure Evaluation (Addendum)
Anesthesia Post Note  Patient: Walter Vargas  Procedure(s) Performed: CORONARY ARTERY BYPASS GRAFTING X 5, USING LEFT INTERNAL MAMMARY ARTERY AND ENDOSCOPICALLY HARVESTED RIGHT SAPHENOUS VEIN GRAFT (Chest) TRANSESOPHAGEAL ECHOCARDIOGRAM (TEE) PLACEMENT OF IMPELLA LEFT VENTRICULAR ASSIST DEVICE     Patient location during evaluation: SICU Anesthesia Type: General Level of consciousness: sedated Pain management: pain level controlled Vital Signs Assessment: post-procedure vital signs reviewed and stable Respiratory status: patient remains intubated per anesthesia plan Cardiovascular status: stable Postop Assessment: no apparent nausea or vomiting Anesthetic complications: no Comments: OG tube placed at the conclusion of the case after the TEE was removed. Frank blood returned. This was relayed to the surgeon and ICU nurses who will monitor  No notable events documented.  Last Vitals:  Vitals:   03/01/23 0738 03/01/23 0739  BP:    Pulse: (!) 54 (!) 55  Resp: 18 (!) 21  Temp:    SpO2: 100% 100%    Last Pain:  Vitals:   03/01/23 0637  TempSrc: Oral  PainSc:                  Chrishawn Boley,W. EDMOND

## 2023-03-01 NOTE — Progress Notes (Addendum)
Advanced Heart Failure Rounding Note  PCP-Cardiologist: None  Chief Complaint: Cardiogenic Shock  Subjective:    s/p CABG and Impella 5.5 Insertion. Post-Op day #0 Intra-op received 3u RBCs, 2L plasmalyte, 1L albumin, and ~580 cell saver.  Stable transferred to ICU on ventilator. On NE 10, Milrinone 0.375, and Vaso 0.04. Now on Epi 2.  Swan#s: CVP 4, PAP 28/17, CO 2.86, CI 1.74, PAPi 2.75  Objective:   Weight Range: 54.9 kg Body mass index is 18.4 kg/m.   Vital Signs:   Temp:  [97.6 F (36.4 C)-98.2 F (36.8 C)] 98.2 F (36.8 C) (12/19 0637) Pulse Rate:  [54-110] 55 (12/19 0739) Resp:  [14-21] 21 (12/19 0739) BP: (114-119)/(61-74) 119/61 (12/19 0637) SpO2:  [92 %-100 %] 100 % (12/19 0739) Weight:  [53.6 kg-54.9 kg] 54.9 kg (12/19 0637) Last BM Date : 02/27/23  Weight change: Filed Weights   02/28/23 0439 03/01/23 0540 03/01/23 0637  Weight: 53.7 kg 53.6 kg 54.9 kg   Intake/Output:   Intake/Output Summary (Last 24 hours) at 03/01/2023 1513 Last data filed at 03/01/2023 1508 Gross per 24 hour  Intake 3345.73 ml  Output 2830 ml  Net 515.73 ml    Physical Exam   CVP 3-4 General: Intubated and sedated on vent HEENT: neck supple.   Cardiac: JVP not visible. Impella hum. Resp: Lung sounds clear and equal B/L Abdomen: Soft, non-tender, non-distended.  Extremities: Warm and dry. No rash, cyanosis.  No edema.  Neuro: Sedated. Lines/Devices: R axillary graft + impella 5.5, RIJ PAC + introducer, L brach aline, and 1 L pleural CT, Foley, ETT, OGT  Telemetry   ST 110s (personally reviewed)  Labs    CBC Recent Labs    02/28/23 0409 03/01/23 0409 03/01/23 0834 03/01/23 1145 03/01/23 1157 03/01/23 1404 03/01/23 1407  WBC 7.0 8.2  --   --   --   --   --   HGB 11.2* 10.8*   < > 7.1*   < > 8.8* 9.5*  HCT 35.1* 33.7*   < > 22.3*   < > 26.0* 28.0*  MCV 84.6 85.1  --   --   --   --   --   PLT 319 328  --  218  --   --   --    < > = values in this  interval not displayed.   Basic Metabolic Panel Recent Labs    16/10/96 1446 03/01/23 0409 03/01/23 0834 03/01/23 1257 03/01/23 1325 03/01/23 1404 03/01/23 1407  NA 134* 133*   < > 135   < > 136 135  K 4.6 4.1   < > 4.5   < > 4.8 4.8  CL 98 100   < > 103  --   --  100  CO2 24 25  --   --   --   --   --   GLUCOSE 89 96   < > 188*  --   --  169*  BUN 22 20   < > 17  --   --  16  CREATININE 0.84 0.69   < > 0.40*  --   --  0.50*  CALCIUM 9.5 9.0  --   --   --   --   --    < > = values in this interval not displayed.   Liver Function Tests Recent Labs    02/28/23 1446  AST 18  ALT 15  ALKPHOS 59  BILITOT 0.6  PROT 7.1  ALBUMIN 3.4*    No results for input(s): "LIPASE", "AMYLASE" in the last 72 hours. Cardiac Enzymes No results for input(s): "CKTOTAL", "CKMB", "CKMBINDEX", "TROPONINI" in the last 72 hours.  BNP: BNP (last 3 results) Recent Labs    02/10/23 1120  BNP >4,500.0*   ProBNP (last 3 results) Recent Labs    10/12/22 1030 10/19/22 0852 01/08/23 0957  PROBNP 2,878.0* 2,297.0* >4978.0*   D-Dimer No results for input(s): "DDIMER" in the last 72 hours. Hemoglobin A1C Recent Labs    02/28/23 1446  HGBA1C 5.9*    Fasting Lipid Panel No results for input(s): "CHOL", "HDL", "LDLCALC", "TRIG", "CHOLHDL", "LDLDIRECT" in the last 72 hours. Thyroid Function Tests No results for input(s): "TSH", "T4TOTAL", "T3FREE", "THYROIDAB" in the last 72 hours.  Invalid input(s): "FREET3"  Other results:  Imaging   DG C-Arm 1-60 Min-No Report Result Date: 03/01/2023 Fluoroscopy was utilized by the requesting physician.  No radiographic interpretation.   DG Chest 2 View Result Date: 02/28/2023 CLINICAL DATA:  Coronary artery disease. EXAM: CHEST - 2 VIEW COMPARISON:  Chest x-ray 02/27/2023 FINDINGS: The heart size and mediastinal contours are within normal limits. Both lungs are clear. The visualized skeletal structures are unremarkable. IMPRESSION: No active  cardiopulmonary disease. Electronically Signed   By: Darliss Cheney M.D.   On: 02/28/2023 19:05   Medications:    Scheduled Medications:  [MAR Hold] aspirin  81 mg Oral Daily   [MAR Hold] atorvastatin  80 mg Oral Daily   Chlorhexidine Gluconate Cloth  6 each Topical Once   And   Chlorhexidine Gluconate Cloth  6 each Topical Once   [MAR Hold] digoxin  0.125 mg Oral Daily   [MAR Hold] furosemide  20 mg Oral Daily   heparin sodium (porcine) 5,000 Units, papaverine 60 mg in electrolyte-A (PLASMALYTE-A PH 7.4) 1,000 mL irrigation   Irrigation To OR   [facility] Blood Cardioplegia vial (lidocaine/magnesium/mannitol 0.26g-4g-6.4g)   Intracoronary To OR   [MAR Hold] metoprolol succinate  12.5 mg Oral Daily   [MAR Hold] polyethylene glycol  17 g Oral Daily   potassium chloride  80 mEq Other To OR   [MAR Hold] senna-docusate  2 tablet Oral QHS   [MAR Hold] sodium chloride flush  3 mL Intravenous Q12H   [MAR Hold] spironolactone  25 mg Oral Daily   tranexamic acid  2 mg/kg Intracatheter To OR   Infusions:  heparin 30,000 units/NS 1000 mL solution for CELLSAVER     heparin 1,350 Units/hr (02/28/23 1916)   milrinone     nitroGLYCERIN     sodium bicarbonate 25 mEq (Impella PURGE) in dextrose 5 % 1000 mL bag     PRN Medications: 0.9 % irrigation (POUR BTL), [MAR Hold] acetaminophen, hemostatic agents (no charge) Optime, heparin sodium (porcine) 2,500 Units, papaverine 30 mg in electrolyte-A (PLASMALYTE-A PH 7.4) 500 mL irrigation, [MAR Hold] ondansetron (ZOFRAN) IV, [MAR Hold] sodium chloride flush, Surgifoam 1 Gm with 0.9% sodium chloride (4 ml) topical solution  Patient Profile   Walter Vargas is a 77 y.o. history of HTN, Walter, HFrEF, and severe 3V CAD.  S/p CABG and Impella 5.5 Insertion 12/19 w D.r Dorris Fetch.   Assessment/Plan   Cardiogenic Postoperative Shock - Intra-op course complicated by cardiac stun and failure to wean from bypass. Impella 5.5 placed.  - Impella 5.5 P7. Flow ~4.0LPM.  CVP 3-4. Unable to tolerate p8 without suction. - Under-filled, needs aggressive volume resuscitation. Continue to monitor CT output. CBC pending - No pulse pressure  on exam. MAP 72. CI 1.74 - Restart Epi. Initially held for tachycardia up to 160s. - On NE 10, Milrinone 0.375, and Vaso 0.04  2.[name] CAD: Patient admitted with NSTEMI (?culprit D2) but with severe extensive diffuse 3 vessel disease.  Disease too extensive to treat percutaneously.  Anatomy would be best served by CABG, but CABG would be moderate-high risk given cardiomyopathy.  Echo 12/4 showed mild-moderate RV dysfunction and PAPi was adequate on RHC, so do not think RV function will be a limitation.  - s/p CABG and Impella 5.5 insertion w Dr. Dorris Fetch 12/19. - Vent management per CCM. CXR pending. - Follow CT output closely - Continue ASA 81 - Held statin w/ mildly elevated LFTs while being treated w/ Plaxlovid. Will need to restart prior to discharge.  3. Acute on chronic systolic CHF: Patient has had HF symptoms for at least a couple of months.  Suspect pre-existing ischemic cardiomyopathy then he presented this admission with NSTEMI possible in D2 territory leading to CHF exacerbation. He has been diuresed, RHC 12/3 showed normal filling pressures and low CI at 2.0. Echo 12/4: EF 20%, RV mild-moderately dysfunctional, mild-moderate Walter.  - in CGS as above. - Hold diuretics in immediate post-op - Hold GDMT with pressor requirement (prev on: digoxin 0.125, spiro 25 and metoprolol XL12.5) - Hold on adding SGLT2i and ARB/ARNi until post CABG  - Will need repeat Echo closer to discharge  - Follow renal function  4. 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  Patient has had on and off confusion in hospital.  - He would be at risk for worsening post-op delirium in setting of CABG.    5. Gallbladder disease: Patient had gallstones but HIDA scan not suggestive of cholecystitis.  6. COVID+: Completed Paxlovid  7. Fall: Had  inpatient fall. No injuries. Did not hit his head.   8. 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      Length of Stay: 31  [place] Pama Roskos, NP  03/01/2023, 3:13 PM  Advanced Heart Failure Team Pager [phone] (M-F; 7a - 5p)  Please contact [facility] for night-coverage after hours (5p -7a ) and weekends on [email or web address]

## 2023-03-01 NOTE — CV Procedure (Signed)
CV PROCEDURE  Procedure:  1. Insertion of Impells 5.5 ventricular assist device  Indication  Acute systolic HF, CAD - inability to separate from cardiopulmonary bypass pump post CABG  Operators:  1. Andrey Spearman, MD (Primary) 2. Arvilla Meres, MD (co-primary) 3. Clearnce Hasten, MD  77 y/o male with severe CAD and severe ischemic CM. Underwent CABG today by Dr. Dorris Fetch. Post-CABG was having difficulty separating from CPB. Severe LV dysfunction by TEE (pre-existing). RV function looked good visually and by TEE. Decision made to proceed with Impella 5.5 placement.   Dr. Sunday Corn attached a Dacron graft to the ascending aorta. Using TEE guidance a 5FR pigtail was placed across the aortic valve using an 0.035" J-wire for support. Fluoroscopy was brought in to the OR suite. Under fluoroscopic guidance, the J-wire was removed and the Impella placement wire was inserted through the pigtail and wire was placed in the LV wrapping the apex and anterior wall. The pigtail was removed. The Impella 5.5 device was brought to the table and inserted into the LV over the wire and positioned appropriately using fluoro and TEE guidance. The wire was removed and Impella flow was commenced without issue.   Once stable flow established. The ECMO circuit was clamped and patient remained stable. Dr. Dorris Fetch then removed the peel-away sheath from the graft and sutured in the repositioning sheath with good hemostasis.   See Dr. Sunday Corn operative note for further details.   Arvilla Meres, MD  3:20 PM

## 2023-03-01 NOTE — Anesthesia Procedure Notes (Deleted)
Central Venous Catheter Insertion Performed by: Gaynelle Adu, MD, anesthesiologist Start/End12/19/2024 7:15 AM, 03/01/2023 7:20 AM Patient location: Pre-op. Preanesthetic checklist: patient identified, IV checked, site marked, risks and benefits discussed, surgical consent, monitors and equipment checked, pre-op evaluation, timeout performed and anesthesia consent Hand hygiene performed  and maximum sterile barriers used  PA cath was placed.Swan type:thermodilution PA Cath depth:50 Procedure performed without using ultrasound guided technique. Attempts: 1 Patient tolerated the procedure well with no immediate complications.

## 2023-03-01 NOTE — Progress Notes (Addendum)
  Patient coagulopathic on return from OR.  ~1500cc our from CTs over first few hours.   Dr. Dorris Fetch at bedside.   Supported with multiple units of blood products including RBCs, cryo, FFP and Factor VII. Also given multiple bottles of albumin.   Repeat DIC panel with low platelets and elevated PTT. A second dose of protamine was given.   ABG with mild respiratory acidosis. Vent rate increased.   Patient transiently dropped BP and developed suction alarm on Impella with brief VT due to low preload. Impella turned down to P-7 and resolved. Pressure supported with more volume/blood and MAPs improved. Impella turned back up to P-8 with good flows.   If bleeding persists will return to OR per Dr. Dorris Fetch.   Critical care time 120 minutes  Arvilla Meres, MD  8:28 PM

## 2023-03-01 NOTE — Interval H&P Note (Signed)
History and Physical Interval Note:   Confused this AM, no focal deficit  03/01/2023 7:47 AM  Walter Vargas  has presented today for surgery, with the diagnosis of CAD MR CHF.  The various methods of treatment have been discussed with the patient and family. After consideration of risks, benefits and other options for treatment, the patient has consented to  Procedure(s): CORONARY ARTERY BYPASS GRAFTING (CABG) (N/A) possible MITRAL VALVE REPAIR or replacement (MVR) (N/A) TRANSESOPHAGEAL ECHOCARDIOGRAM (TEE) (N/A) as a surgical intervention.  The patient's history has been reviewed, patient examined, no change in status, stable for surgery.  I have reviewed the patient's chart and labs.  Questions were answered to the patient's satisfaction.     [name]

## 2023-03-01 NOTE — Discharge Instructions (Signed)
Discharge Instructions:  1. You may shower, please wash incisions daily with soap and water and keep dry.  If you wish to cover wounds with dressing you may do so but please keep clean and change daily.  No tub baths or swimming until incisions have completely healed.  If your incisions become red or develop any drainage please call our office at [phone]  2. No Driving until cleared by Dr. Hendrickson's office and you are no longer using narcotic pain medications  3. Monitor your weight daily.. Please use the same scale and weigh at same time... If you gain 5-10 lbs in 48 hours with associated lower extremity swelling, please contact our office at [phone]  4. Fever of 101.5 for at least 24 hours with no source, please contact our office at [phone]  5. Activity- up as tolerated, please walk at least 3 times per day.  Avoid strenuous activity, no lifting, pushing, or pulling with your arms over 8-10 lbs for a minimum of 6 weeks  6. If any questions or concerns arise, please do not hesitate to contact our office at [phone]  

## 2023-03-01 NOTE — Anesthesia Preprocedure Evaluation (Signed)
Anesthesia Evaluation  Patient identified by MRN, date of birth, ID band Patient unresponsive    Reviewed: Allergy & Precautions, H&P , Patient's Chart, lab work & pertinent test resultsPreop documentation limited or incomplete due to emergent nature of procedure.  Airway Mallampati: Intubated       Dental  (+) Poor Dentition, Dental Advisory Given   Pulmonary neg pulmonary ROS, former smoker   Pulmonary exam normal breath sounds clear to auscultation       Cardiovascular Exercise Tolerance: Good hypertension, Pt. on medications and Pt. on home beta blockers + Past MI and +CHF   Rhythm:Regular Rate:Normal     Neuro/Psych negative neurological ROS  negative psych ROS   GI/Hepatic negative GI ROS, Neg liver ROS,,,  Endo/Other  negative endocrine ROS    Renal/GU Renal disease     Musculoskeletal   Abdominal   Peds  Hematology negative hematology ROS (+)   Anesthesia Other Findings   Reproductive/Obstetrics                             Anesthesia Physical Anesthesia Plan  ASA: 4 and emergent  Anesthesia Plan: General   Post-op Pain Management:    Induction: Intravenous  PONV Risk Score and Plan: 3 and Ondansetron, Midazolam and Treatment may vary due to age or medical condition  Airway Management Planned: Oral ETT  Additional Equipment: Arterial line, CVP, PA Cath, TEE, 3D TEE and Ultrasound Guidance Line Placement  Intra-op Plan:   Post-operative Plan: Post-operative intubation/ventilation  Informed Consent: I have reviewed the patients History and Physical, chart, labs and discussed the procedure including the risks, benefits and alternatives for the proposed anesthesia with the patient or authorized representative who has indicated his/her understanding and acceptance.     Only emergency history available and History available from chart only  Plan Discussed with:  CRNA  Anesthesia Plan Comments:        Anesthesia Quick Evaluation

## 2023-03-01 NOTE — Anesthesia Procedure Notes (Signed)
Central Venous Catheter Insertion Performed by: Gaynelle Adu, MD, anesthesiologist Start/End12/19/2024 6:55 AM, 03/01/2023 7:15 AM Patient location: Pre-op. Preanesthetic checklist: patient identified, IV checked, site marked, risks and benefits discussed, surgical consent, monitors and equipment checked, pre-op evaluation, timeout performed and anesthesia consent Position: Trendelenburg Lidocaine 1% used for infiltration and patient sedated Hand hygiene performed , maximum sterile barriers used  and Seldinger technique used Catheter size: 9 Fr Total catheter length 10. Central line was placed.MAC introducer Procedure performed using ultrasound guided technique. Ultrasound Notes:anatomy identified, needle tip was noted to be adjacent to the nerve/plexus identified, no ultrasound evidence of intravascular and/or intraneural injection and image(s) printed for medical record Attempts: 1 Following insertion, line sutured, dressing applied and Biopatch. Post procedure assessment: blood return through all ports, free fluid flow and no air  Patient tolerated the procedure well with no immediate complications.

## 2023-03-01 NOTE — Progress Notes (Signed)
      [address]       [name] [address]             [phone]       [name] has been bleeding since OR.  Initially coagulopathic and FFP, cryo and PLT given  Elevated PTT- suspected heparin rebound- given additional protamine  PRBC for anemia  For a time his output decreased, but now is bleeding again  Hemodynamics stable on Impella  I discussed the need for reexploration for bleeding with his son Walter Vargas.  He understands the indications, risks, benefits and alternatives.  He goves verbal consent witnessed by RN  OR notified  Salvatore Decent. Dorris Fetch, MD [facility] [phone]

## 2023-03-02 ENCOUNTER — Inpatient Hospital Stay (HOSPITAL_COMMUNITY): Payer: 59

## 2023-03-02 ENCOUNTER — Encounter (HOSPITAL_COMMUNITY): Payer: Self-pay | Admitting: Thoracic Surgery (Cardiothoracic Vascular Surgery)

## 2023-03-02 DIAGNOSIS — I97611 Postprocedural hemorrhage and hematoma of a circulatory system organ or structure following cardiac bypass: Secondary | ICD-10-CM

## 2023-03-02 LAB — BPAM FFP
Blood Product Expiration Date: 202412192359
Blood Product Expiration Date: 202412192359
Blood Product Expiration Date: 202412202359
Blood Product Expiration Date: 202412242359
Blood Product Expiration Date: 202412252359
Blood Product Expiration Date: 202412252359
ISSUE DATE / TIME: 202412191644
ISSUE DATE / TIME: 202412191644
ISSUE DATE / TIME: 202412191644
ISSUE DATE / TIME: 202412191644
Unit Type and Rh: 6200
Unit Type and Rh: 6200
Unit Type and Rh: 6200
Unit Type and Rh: 6200
Unit Type and Rh: 6200
Unit Type and Rh: 6200

## 2023-03-02 LAB — CBC
HCT: 28.3 % — ABNORMAL LOW (ref 39.0–52.0)
HCT: 25.8 % — ABNORMAL LOW (ref 39.0–52.0)
HCT: 24.1 % — ABNORMAL LOW (ref 39.0–52.0)
Hemoglobin: 9.6 g/dL — ABNORMAL LOW (ref 13.0–17.0)
Hemoglobin: 9 g/dL — ABNORMAL LOW (ref 13.0–17.0)
Hemoglobin: 8.4 g/dL — ABNORMAL LOW (ref 13.0–17.0)
MCH: 30 pg (ref 26.0–34.0)
MCH: 29.7 pg (ref 26.0–34.0)
MCH: 30 pg (ref 26.0–34.0)
MCHC: 33.9 g/dL (ref 30.0–36.0)
MCHC: 34.9 g/dL (ref 30.0–36.0)
MCHC: 34.9 g/dL (ref 30.0–36.0)
MCV: 88.4 fL (ref 80.0–100.0)
MCV: 85.1 fL (ref 80.0–100.0)
MCV: 86.1 fL (ref 80.0–100.0)
Platelets: 70 10*3/uL — ABNORMAL LOW (ref 150–400)
Platelets: 68 10*3/uL — ABNORMAL LOW (ref 150–400)
Platelets: 105 10*3/uL — ABNORMAL LOW (ref 150–400)
RBC: 3.2 MIL/uL — ABNORMAL LOW (ref 4.22–5.81)
RBC: 3.03 MIL/uL — ABNORMAL LOW (ref 4.22–5.81)
RBC: 2.8 MIL/uL — ABNORMAL LOW (ref 4.22–5.81)
RDW: 15.1 % (ref 11.5–15.5)
RDW: 15.5 % (ref 11.5–15.5)
RDW: 16 % — ABNORMAL HIGH (ref 11.5–15.5)
WBC: 8.5 10*3/uL (ref 4.0–10.5)
WBC: 6.4 10*3/uL (ref 4.0–10.5)
WBC: 9.4 10*3/uL (ref 4.0–10.5)
nRBC: 0 % (ref 0.0–0.2)
nRBC: 0 % (ref 0.0–0.2)
nRBC: 0 % (ref 0.0–0.2)

## 2023-03-02 LAB — BASIC METABOLIC PANEL
Anion gap: 12 (ref 5–15)
Anion gap: 7 (ref 5–15)
Anion gap: 8 (ref 5–15)
BUN: 12 mg/dL (ref 8–23)
BUN: 10 mg/dL (ref 8–23)
BUN: 11 mg/dL (ref 8–23)
CO2: 20 mmol/L — ABNORMAL LOW (ref 22–32)
CO2: 23 mmol/L (ref 22–32)
CO2: 22 mmol/L (ref 22–32)
Calcium: 7.3 mg/dL — ABNORMAL LOW (ref 8.9–10.3)
Calcium: 7.4 mg/dL — ABNORMAL LOW (ref 8.9–10.3)
Calcium: 7.6 mg/dL — ABNORMAL LOW (ref 8.9–10.3)
Chloride: 104 mmol/L (ref 98–111)
Chloride: 104 mmol/L (ref 98–111)
Chloride: 102 mmol/L (ref 98–111)
Creatinine, Ser: 0.85 mg/dL (ref 0.61–1.24)
Creatinine, Ser: 0.96 mg/dL (ref 0.61–1.24)
Creatinine, Ser: 0.57 mg/dL — ABNORMAL LOW (ref 0.61–1.24)
GFR, Estimated: >60 mL/min (ref >60)
GFR, Estimated: >60 mL/min (ref >60)
GFR, Estimated: >60 mL/min (ref >60)
Glucose, Bld: 187 mg/dL — ABNORMAL HIGH (ref 70–99)
Glucose, Bld: 153 mg/dL — ABNORMAL HIGH (ref 70–99)
Glucose, Bld: 139 mg/dL — ABNORMAL HIGH (ref 70–99)
Potassium: 3.6 mmol/L (ref 3.5–5.1)
Potassium: 4.2 mmol/L (ref 3.5–5.1)
Potassium: 4.5 mmol/L (ref 3.5–5.1)
Sodium: 136 mmol/L (ref 135–145)
Sodium: 134 mmol/L — ABNORMAL LOW (ref 135–145)
Sodium: 132 mmol/L — ABNORMAL LOW (ref 135–145)

## 2023-03-02 LAB — BPAM PLATELET PHERESIS
Blood Product Expiration Date: 202412202359
Blood Product Expiration Date: 202412202359
Blood Product Expiration Date: 202412202359
ISSUE DATE / TIME: 202412191952
ISSUE DATE / TIME: 202412192239
ISSUE DATE / TIME: 202412192344
Unit Type and Rh: 5100
Unit Type and Rh: 7300
Unit Type and Rh: 7300

## 2023-03-02 LAB — POCT I-STAT 7, (LYTES, BLD GAS, ICA,H+H)
Acid-base deficit: 8 mmol/L — ABNORMAL HIGH (ref 0.0–2.0)
Acid-base deficit: 4 mmol/L — ABNORMAL HIGH (ref 0.0–2.0)
Acid-base deficit: 5 mmol/L — ABNORMAL HIGH (ref 0.0–2.0)
Acid-base deficit: 6 mmol/L — ABNORMAL HIGH (ref 0.0–2.0)
Acid-base deficit: 7 mmol/L — ABNORMAL HIGH (ref 0.0–2.0)
Acid-base deficit: 2 mmol/L (ref 0.0–2.0)
Acid-base deficit: 1 mmol/L (ref 0.0–2.0)
Bicarbonate: 18.9 mmol/L — ABNORMAL LOW (ref 20.0–28.0)
Bicarbonate: 19.4 mmol/L — ABNORMAL LOW (ref 20.0–28.0)
Bicarbonate: 20.8 mmol/L (ref 20.0–28.0)
Bicarbonate: 21.6 mmol/L (ref 20.0–28.0)
Bicarbonate: 22.9 mmol/L (ref 20.0–28.0)
Bicarbonate: 21.8 mmol/L (ref 20.0–28.0)
Bicarbonate: 22.7 mmol/L (ref 20.0–28.0)
Calcium, Ion: 0.94 mmol/L — ABNORMAL LOW (ref 1.15–1.40)
Calcium, Ion: 1.05 mmol/L — ABNORMAL LOW (ref 1.15–1.40)
Calcium, Ion: 1.05 mmol/L — ABNORMAL LOW (ref 1.15–1.40)
Calcium, Ion: 1.14 mmol/L — ABNORMAL LOW (ref 1.15–1.40)
Calcium, Ion: 1.17 mmol/L (ref 1.15–1.40)
Calcium, Ion: 1.06 mmol/L — ABNORMAL LOW (ref 1.15–1.40)
Calcium, Ion: 1.09 mmol/L — ABNORMAL LOW (ref 1.15–1.40)
HCT: 28 % — ABNORMAL LOW (ref 39.0–52.0)
HCT: 20 % — ABNORMAL LOW (ref 39.0–52.0)
HCT: 24 % — ABNORMAL LOW (ref 39.0–52.0)
HCT: 26 % — ABNORMAL LOW (ref 39.0–52.0)
HCT: 26 % — ABNORMAL LOW (ref 39.0–52.0)
HCT: 22 % — ABNORMAL LOW (ref 39.0–52.0)
HCT: 23 % — ABNORMAL LOW (ref 39.0–52.0)
Hemoglobin: 9.5 g/dL — ABNORMAL LOW (ref 13.0–17.0)
Hemoglobin: 6.8 g/dL — CL (ref 13.0–17.0)
Hemoglobin: 8.2 g/dL — ABNORMAL LOW (ref 13.0–17.0)
Hemoglobin: 8.8 g/dL — ABNORMAL LOW (ref 13.0–17.0)
Hemoglobin: 8.8 g/dL — ABNORMAL LOW (ref 13.0–17.0)
Hemoglobin: 7.5 g/dL — ABNORMAL LOW (ref 13.0–17.0)
Hemoglobin: 7.8 g/dL — ABNORMAL LOW (ref 13.0–17.0)
O2 Saturation: 100 %
O2 Saturation: 100 %
O2 Saturation: 100 %
O2 Saturation: 95 %
O2 Saturation: 99 %
O2 Saturation: 100 %
O2 Saturation: 97 %
Patient temperature: 34.2
Patient temperature: 34.6
Patient temperature: 34.9
Patient temperature: 35.2
Patient temperature: 36.5
Patient temperature: 37.5
Patient temperature: 37.8
Potassium: 3.7 mmol/L (ref 3.5–5.1)
Potassium: 3.5 mmol/L (ref 3.5–5.1)
Potassium: 3.7 mmol/L (ref 3.5–5.1)
Potassium: 3.8 mmol/L (ref 3.5–5.1)
Potassium: 4.1 mmol/L (ref 3.5–5.1)
Potassium: 4.5 mmol/L (ref 3.5–5.1)
Potassium: 4.3 mmol/L (ref 3.5–5.1)
Sodium: 137 mmol/L (ref 135–145)
Sodium: 135 mmol/L (ref 135–145)
Sodium: 135 mmol/L (ref 135–145)
Sodium: 136 mmol/L (ref 135–145)
Sodium: 137 mmol/L (ref 135–145)
Sodium: 134 mmol/L — ABNORMAL LOW (ref 135–145)
Sodium: 133 mmol/L — ABNORMAL LOW (ref 135–145)
TCO2: 20 mmol/L — ABNORMAL LOW (ref 22–32)
TCO2: 21 mmol/L — ABNORMAL LOW (ref 22–32)
TCO2: 22 mmol/L (ref 22–32)
TCO2: 23 mmol/L (ref 22–32)
TCO2: 24 mmol/L (ref 22–32)
TCO2: 23 mmol/L (ref 22–32)
TCO2: 24 mmol/L (ref 22–32)
pCO2 arterial: 40.1 mm[Hg] (ref 32–48)
pCO2 arterial: 37.6 mm[Hg] (ref 32–48)
pCO2 arterial: 40 mm[Hg] (ref 32–48)
pCO2 arterial: 44.4 mm[Hg] (ref 32–48)
pCO2 arterial: 45.6 mm[Hg] (ref 32–48)
pCO2 arterial: 31.6 mm[Hg] — ABNORMAL LOW (ref 32–48)
pCO2 arterial: 35.2 mm[Hg] (ref 32–48)
pH, Arterial: 7.267 — ABNORMAL LOW (ref 7.35–7.45)
pH, Arterial: 7.292 — ABNORMAL LOW (ref 7.35–7.45)
pH, Arterial: 7.299 — ABNORMAL LOW (ref 7.35–7.45)
pH, Arterial: 7.308 — ABNORMAL LOW (ref 7.35–7.45)
pH, Arterial: 7.314 — ABNORMAL LOW (ref 7.35–7.45)
pH, Arterial: 7.448 (ref 7.35–7.45)
pH, Arterial: 7.42 (ref 7.35–7.45)
pO2, Arterial: 570 mm[Hg] — ABNORMAL HIGH (ref 83–108)
pO2, Arterial: 162 mm[Hg] — ABNORMAL HIGH (ref 83–108)
pO2, Arterial: 203 mm[Hg] — ABNORMAL HIGH (ref 83–108)
pO2, Arterial: 205 mm[Hg] — ABNORMAL HIGH (ref 83–108)
pO2, Arterial: 81 mm[Hg] — ABNORMAL LOW (ref 83–108)
pO2, Arterial: 170 mm[Hg] — ABNORMAL HIGH (ref 83–108)
pO2, Arterial: 95 mm[Hg] (ref 83–108)

## 2023-03-02 LAB — PREPARE PLATELET PHERESIS
Unit division: 0
Unit division: 0
Unit division: 0

## 2023-03-02 LAB — PREPARE FRESH FROZEN PLASMA
Unit division: 0
Unit division: 0

## 2023-03-02 LAB — HEPATIC FUNCTION PANEL
ALT: 13 U/L (ref 0–44)
AST: 51 U/L — ABNORMAL HIGH (ref 15–41)
Albumin: 2.8 g/dL — ABNORMAL LOW (ref 3.5–5.0)
Alkaline Phosphatase: 27 U/L — ABNORMAL LOW (ref 38–126)
Bilirubin, Direct: 0.3 mg/dL — ABNORMAL HIGH (ref 0.0–0.2)
Indirect Bilirubin: 0.8 mg/dL (ref 0.3–0.9)
Total Bilirubin: 1.1 mg/dL (ref <1.2)
Total Protein: 4.4 g/dL — ABNORMAL LOW (ref 6.5–8.1)

## 2023-03-02 LAB — GLUCOSE, CAPILLARY
Glucose-Capillary: 184 mg/dL — ABNORMAL HIGH (ref 70–99)
Glucose-Capillary: 155 mg/dL — ABNORMAL HIGH (ref 70–99)
Glucose-Capillary: 100 mg/dL — ABNORMAL HIGH (ref 70–99)
Glucose-Capillary: 104 mg/dL — ABNORMAL HIGH (ref 70–99)
Glucose-Capillary: 141 mg/dL — ABNORMAL HIGH (ref 70–99)
Glucose-Capillary: 145 mg/dL — ABNORMAL HIGH (ref 70–99)
Glucose-Capillary: 117 mg/dL — ABNORMAL HIGH (ref 70–99)
Glucose-Capillary: 104 mg/dL — ABNORMAL HIGH (ref 70–99)
Glucose-Capillary: 82 mg/dL (ref 70–99)
Glucose-Capillary: 148 mg/dL — ABNORMAL HIGH (ref 70–99)
Glucose-Capillary: 146 mg/dL — ABNORMAL HIGH (ref 70–99)
Glucose-Capillary: 133 mg/dL — ABNORMAL HIGH (ref 70–99)
Glucose-Capillary: 132 mg/dL — ABNORMAL HIGH (ref 70–99)

## 2023-03-02 LAB — BPAM CRYOPRECIPITATE
Blood Product Expiration Date: 202412212359
Blood Product Expiration Date: 202412242359
Blood Product Expiration Date: 202412242359
ISSUE DATE / TIME: 202412191836
ISSUE DATE / TIME: 202412192341
ISSUE DATE / TIME: 202412192341
Unit Type and Rh: 6200
Unit Type and Rh: 5100
Unit Type and Rh: 6200

## 2023-03-02 LAB — MAGNESIUM
Magnesium: 2 mg/dL (ref 1.7–2.4)
Magnesium: 1.7 mg/dL (ref 1.7–2.4)

## 2023-03-02 LAB — CG4 I-STAT (LACTIC ACID): Lactic Acid, Venous: 2.1 mmol/L (ref 0.5–1.9)

## 2023-03-02 LAB — DIC (DISSEMINATED INTRAVASCULAR COAGULATION)PANEL
D-Dimer, Quant: 0.54 ug{FEU}/mL — ABNORMAL HIGH (ref 0.00–0.50)
Fibrinogen: 190 mg/dL — ABNORMAL LOW (ref 210–475)
INR: 1.1 (ref 0.8–1.2)
Platelets: 68 10*3/uL — ABNORMAL LOW (ref 150–400)
Prothrombin Time: 14.8 s (ref 11.4–15.2)
Smear Review: NONE SEEN
aPTT: 48 s — ABNORMAL HIGH (ref 24–36)

## 2023-03-02 LAB — PREPARE CRYOPRECIPITATE
Unit division: 0
Unit division: 0
Unit division: 0

## 2023-03-02 MED ORDER — SODIUM CHLORIDE 0.9% IV SOLUTION: Freq: Once | INTRAVENOUS | Status: DC

## 2023-03-02 MED ORDER — SODIUM BICARBONATE 8.4 % IV SOLN
INTRAVENOUS | Status: AC
  Filled 2023-03-02: qty 50

## 2023-03-02 MED ORDER — ORAL CARE MOUTH RINSE: 15.0000 mL | OROMUCOSAL | Status: DC | PRN

## 2023-03-02 MED ORDER — AMIODARONE LOAD VIA INFUSION
150.0000 mg | Freq: Once | INTRAVENOUS | Status: AC
  Administered 2023-03-02: 150 mg via INTRAVENOUS
  Filled 2023-03-02: qty 83.34

## 2023-03-02 MED ORDER — SODIUM CHLORIDE 0.9% FLUSH: 10.0000 mL | INTRAVENOUS | Status: DC | PRN

## 2023-03-02 MED ORDER — SODIUM CHLORIDE 0.9% IV SOLUTION: Freq: Once | INTRAVENOUS | Status: AC

## 2023-03-02 MED ORDER — VITAL 1.5 CAL PO LIQD
1000.0000 mL | ORAL | Status: DC
  Administered 2023-03-02 – 2023-03-05 (×2): 1000 mL

## 2023-03-02 MED ORDER — SODIUM BICARBONATE 8.4 % IV SOLN
INTRAVENOUS | Status: DC | PRN
  Administered 2023-03-02: 50 meq via INTRAVENOUS

## 2023-03-02 MED ORDER — ASPIRIN 81 MG PO CHEW
81.0000 mg | CHEWABLE_TABLET | Freq: Every day | ORAL | Status: DC
  Filled 2023-03-02: qty 1

## 2023-03-02 MED ORDER — ATORVASTATIN CALCIUM 80 MG PO TABS
80.0000 mg | ORAL_TABLET | Freq: Every day | ORAL | Status: DC
Start: 2023-03-02 — End: 2023-03-08
  Administered 2023-03-02 – 2023-03-08 (×7): 80 mg
  Filled 2023-03-02 (×6): qty 1

## 2023-03-02 MED ORDER — TRAMADOL HCL 50 MG PO TABS: 50.0000 mg | ORAL_TABLET | ORAL | Status: DC | PRN

## 2023-03-02 MED ORDER — SODIUM BICARBONATE 8.4 % IV SOLN
50.0000 meq | Freq: Once | INTRAVENOUS | Status: AC
  Administered 2023-03-02: 50 meq via INTRAVENOUS
  Filled 2023-03-02: qty 50

## 2023-03-02 MED ORDER — AMIODARONE IV BOLUS ONLY 150 MG/100ML: 150.0000 mg | Freq: Once | INTRAVENOUS | Status: DC

## 2023-03-02 MED ORDER — LACTATED RINGERS IV SOLN: INTRAVENOUS | Status: DC | PRN

## 2023-03-02 MED ORDER — CEFAZOLIN SODIUM 1 G IJ SOLR
INTRAMUSCULAR | Status: AC
  Filled 2023-03-02: qty 20

## 2023-03-02 MED ORDER — CALCIUM GLUCONATE-NACL 1-0.675 GM/50ML-% IV SOLN
1.0000 g | Freq: Once | INTRAVENOUS | Status: AC
  Administered 2023-03-02: 1000 mg via INTRAVENOUS
  Filled 2023-03-02: qty 50

## 2023-03-02 MED ORDER — ORAL CARE MOUTH RINSE
15.0000 mL | OROMUCOSAL | Status: DC
  Administered 2023-03-02 (×7): 15 mL via OROMUCOSAL

## 2023-03-02 MED ORDER — INSULIN ASPART 100 UNIT/ML IJ SOLN
0.0000 [IU] | INTRAMUSCULAR | Status: DC
  Administered 2023-03-02 – 2023-03-03 (×5): 2 [IU] via SUBCUTANEOUS

## 2023-03-02 MED ORDER — ORAL CARE MOUTH RINSE
15.0000 mL | Freq: Four times a day (QID) | OROMUCOSAL | Status: DC
  Administered 2023-03-02 – 2023-03-15 (×44): 15 mL via OROMUCOSAL

## 2023-03-02 MED ORDER — OXYCODONE HCL 5 MG PO TABS
5.0000 mg | ORAL_TABLET | ORAL | Status: DC | PRN
  Administered 2023-03-02: 5 mg
  Filled 2023-03-02: qty 1

## 2023-03-02 MED ORDER — ALBUMIN HUMAN 5 % IV SOLN
INTRAVENOUS | Status: AC
  Filled 2023-03-02: qty 250

## 2023-03-02 MED ORDER — DOCUSATE SODIUM 50 MG/5ML PO LIQD
200.0000 mg | Freq: Every day | ORAL | Status: DC
Start: 2023-03-02 — End: 2023-03-08
  Administered 2023-03-02 – 2023-03-05 (×4): 200 mg
  Filled 2023-03-02 (×4): qty 20

## 2023-03-02 MED ORDER — PANTOPRAZOLE SODIUM 40 MG IV SOLR
40.0000 mg | Freq: Every day | INTRAVENOUS | Status: DC
  Administered 2023-03-02 – 2023-03-18 (×17): 40 mg via INTRAVENOUS
  Filled 2023-03-02 (×17): qty 10

## 2023-03-02 MED ORDER — DOCUSATE SODIUM 50 MG/5ML PO LIQD: 200.0000 mg | Freq: Every day | ORAL | Status: DC

## 2023-03-02 MED ORDER — ASPIRIN 81 MG PO CHEW
81.0000 mg | CHEWABLE_TABLET | Freq: Every day | ORAL | Status: DC
  Administered 2023-03-02 – 2023-03-08 (×7): 81 mg
  Filled 2023-03-02 (×6): qty 1

## 2023-03-02 MED ORDER — PROSOURCE TF20 ENFIT COMPATIBL EN LIQD
60.0000 mL | Freq: Every day | ENTERAL | Status: DC
  Administered 2023-03-02 – 2023-03-20 (×17): 60 mL
  Filled 2023-03-02 (×17): qty 60

## 2023-03-02 MED ORDER — MAGNESIUM SULFATE 2 GM/50ML IV SOLN
2.0000 g | Freq: Once | INTRAVENOUS | Status: AC
  Administered 2023-03-02: 2 g via INTRAVENOUS
  Filled 2023-03-02: qty 50

## 2023-03-02 MED ORDER — SODIUM CHLORIDE 0.9% FLUSH
10.0000 mL | Freq: Two times a day (BID) | INTRAVENOUS | Status: DC
  Administered 2023-03-02: 10 mL
  Administered 2023-03-02: 40 mL
  Administered 2023-03-03: 10 mL
  Administered 2023-03-03: 20 mL
  Administered 2023-03-04: 30 mL

## 2023-03-02 MED FILL — Potassium Chloride Inj 2 mEq/ML: INTRAVENOUS | Qty: 40 | Status: AC

## 2023-03-02 MED FILL — Lidocaine HCl Local Preservative Free (PF) Inj 2%: INTRAMUSCULAR | Qty: 14 | Status: AC

## 2023-03-02 MED FILL — Heparin Sodium (Porcine) Inj 1000 Unit/ML: Qty: 1000 | Status: AC

## 2023-03-02 NOTE — Op Note (Unsigned)
NAMESENOVIO, Walter Vargas MEDICAL RECORD NO: [id number] ACCOUNT NO: [id number] DATE OF BIRTH: 12-17-1945 FACILITY: [facility] LOCATION: [facility] PHYSICIAN: Salvatore Decent. 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  PROCEDURE:  Median sternotomy, extracorporeal circulation, coronary artery bypass grafting x5 (left internal mammary artery to the LAD, saphenous vein graft to first diagonal, saphenous vein graft to obtuse marginal 1, sequential saphenous vein graft to  posterior descending and posterolateral), endoscopic vein harvest right leg, placement of Impella 5.5 ventricular assist device.  SURGEON:  Salvatore Decent. Dorris Fetch, MD  ASSISTANT:  Darnell Level, MD and Lowella Dandy, PA.  Experienced assistance was necessary for this case due to surgical complexity.  Erin Barrett independently harvested the saphenous vein and closed the leg incisions while the internal mammary artery was being harvested.  Dr. Mindi Slicker served as the  assistant providing assistance with retraction of delicate tissues, exposure, suture management, suctioning.  I performed all the critical ports of the coronary procedure including the sternotomy, mammary takedown, anastomoses, cannulating and  decannulating and grafting the aorta for the Impella placement.  Dr. Gala Romney and Dr. Elwyn Lade from cardiology placed the Impella device via the graft.  ANESTHESIA:  General.  FINDINGS:  Severe left ventricular dysfunction, mild mitral regurgitation with pre and post bypass, slight improvement in ventricular function post bypass, but low output and rising pulmonary artery pressures necessitated Impella placement.  Ejection  fraction is still only approximately 20% post bypass.  Good quality conduits.  Good quality targets.  CLINICAL  NOTE:  The patient is a 77 year old gentleman admitted with decompensated heart failure with severe three-vessel disease and profound ischemic cardiomyopathy with an ejection fraction of less than 20%.  There were no good percutaneous options  and limited life expectancy with medical therapy.  He was offered coronary bypass grafting albeit as a high-risk procedure with a strong possibility of needing mechanical ventricular support to wean from bypass.  The indications, risks, benefits, and  alternatives were discussed in detail with the patient.  He understood and accepted the risks and agreed to proceed.  OPERATIVE NOTE:  The patient was brought to the operating room on 12/***/2024.  He had induction of general anesthesia and was intubated.  Dr. Sampson Goon performed transesophageal echocardiography.  Please see the separately dictated notes for full  details of the procedure.  Intravenous antibiotics were administered.  A Foley catheter was placed.  The chest, abdomen, and legs were prepped and draped in the usual sterile fashion.  A timeout was performed.  A median sternotomy was performed and the  left internal mammary artery was harvested using standard technique.  Simultaneously, Erin Barrett made an incision in the right leg at the level of the knee.  The greater saphenous vein was harvested from just above the ankle to the groin.  The  saphenous vein was a good quality vessel.  The mammary artery was also a good quality vessel.  2000 units of heparin was administered during the vessel harvest.  The remainder of the full heparin dose was given prior to opening the pericardium.  After harvesting the conduits and administering the remainder of the heparin, the pericardium was opened.  The ascending aorta was inspected.  There was no evidence of atherosclerotic disease.  After confirming adequate anticoagulation with ACT  measurement, the aorta was cannulated via concentric 2-0 Ethibond pledgeted  pursestring  sutures.  A dual-stage venous cannula was placed via a pursestring suture in the right atrial appendage.  Cardiopulmonary bypass was initiated.  Flows were maintained  per protocol.  The patient was cooled at 32 degrees Celsius.  The coronary arteries were inspected and anastomotic sites were chosen.  The conduits were inspected and cut to length.  A foam pad was placed in the pericardium to ventilated the heart.  A  temperature probe was placed in the myocardial septum and the cardioplegia cannula was placed in the ascending aorta.  The aorta was cross-clamped.  The left ventricle was emptied via the aortic root vent.  Cardiac arrest then was achieved with a combination of cold antegrade blood cardioplegia and topical ice saline.  There was a rapid diastolic arrest and subsequently  to 12 degrees with cardioplegia administration.  A reverse saphenous vein graft was placed sequentially to the posterior descending and posterolateral branches of the right coronary artery.  These were both 1.5 mm good quality targets at the site of the anastomosis.  A side-to-side anastomosis was  performed to the posterior descending and an inside of the posterolateral.  Both were done with running 7-0 Prolene sutures.  At the completion of the anastomosis a probe was passed proximally and distally to ensure patency before tying the suture.   Cardioplegia was administered down the graft.  There was good flow and good hemostasis.  Next, reverse saphenous vein graft was placed end-to-side to the first diagonal branch to the LAD.  This was a 1.5 mm good quality target.  The anastomosis was performed with a running 7-0 Prolene suture.  I probed passed easily proximally and distally.   Cardioplegia was administered with good flow and good hemostasis.  A reverse saphenous vein graft then was placed end-to-side to obtuse marginal 1.  This was the only marginal branch.  It was a 1.5 mm good quality vessel.  The vein  was of good quality.  It was anastomosed at end-to-side with a running 7-0 Prolene  suture.  Again, the probe passed easily and cardioplegia was administered with good flow and good hemostasis.  The left internal mammary artery was brought through a window in the pericardium.  The distal limb was beveled.  It was anastomosed end-to-side to the distal LAD.  The LAD had some diffuse disease, but was of good quality at the site of the anastomosis.   The mammary was of good quality.  It was anastomosed end-to-side with a running 8-0 Prolene suture.  At the completion of the anastomosis, the Bulldog clamp was removed.  Rapid septal rewarming was noted.  The Bulldog clamps were placed and the mammary  pedicle was tacked to the epicardial surface of the heart with 6-0 Prolene sutures.  Additional cardioplegia was administered.  The vein grafts were cut to length.  The cardioplegia cannula was removed from the ascending aorta and the proximal vein graft anastomoses were performed with 4.0 mm punch aortotomies with running 6-0 Prolene  sutures.  At the completion of the final proximal anastomosis, the patient was placed in Trendelenburg position.  Lidocaine was administered.  The aortic root was deaired and the aortic cross clamp was removed.  The total cross-clamp time was 97 minutes.   The patient required two defibrillations and then was in a sinus rhythm.  While rewarming was completed, all proximal and distal anastomosis were inspected for hemostasis.  Epicardial pacing wires were placed on the right ventricle and right atrium.   When the  patient had rewarmed to a core temperature of 37 degrees Celsius, a milrinone infusion was initiated as were low-dose epinephrine and norepinephrine infusions.  The patient then weaned from cardiopulmonary bypass on those inotropes and initially  paced, but that was later stopped because of competition with his native heart rate.  The cardiopulmonary bypass time was 133  minutes.  The initial cardiac index was 1.6 liters per minute per meter squared, which was higher than his pre-bypass output,  but still low.  Over time, his PA pressures progressively rose.  Transesophageal echocardiography showed no significant change in his mitral regurgitation, but only slight improvement in wall motion.  At this point, it was felt that the best option would  be to place a mechanical support device and the patient was placed back on cardiopulmonary bypass.  A partial occlusion clamp was placed on the ascending aorta and a 10 mm Hemashield graft was anastomosed end-to-side to the aorta with a running 5-0  Prolene suture.  The graft then was tunneled to a supraclavicular exit site.  The peel-away sheath introducer for the Impella was placed into the graft.  Dr. Nicholes Mango then advanced a wire into the left ventricle.  The pigtail catheter then was  advanced over the wire and the second wire was advanced through the pigtail catheter.  The Impella 5.5 was advanced through the graft and into the left ventricle and positioned using both fluoroscopy and transesophageal echocardiography.  The peel-away  sheath introducer was removed and the Impella device was secured in the graft.  The patient then weaned from cardiopulmonary bypass with the Impella flowing approximately 4 liters per minute.  The second bypass time was 48 minutes.  The patient did have  some hemodynamic instability and had coagulopathic bleeding.  Volume resuscitation and blood products were administered.  The test dose of protamine was administered.  The atrial and aortic cannulae were removed.  The remainder of the protamine was  administered without incident.  The chest was irrigated with warm saline.  Hemostasis was achieved.  Hemostasis was poor due to coagulopathic bleeding and blood products were administered to help with that.  Left pleural and mediastinal chest tubes were  placed and a 28-French Blake drain was  placed into the pericardium.  These were secured with #1 silk sutures.  The sternum was closed with a combination of single and double heavy gauge stainless steel wires.  The pectoralis fascia and subcutaneous  tissue and skin were closed in standard fashion.  All sponge, needle, and instrument counts were correct at the end of the procedure.  The patient remained critically ill and was transported from the operating room to the surgical intensive care unit intubated and in fair condition.   PUS D: 03/02/2023 10:39:18 am T: 03/02/2023 11:49:00 am  JOB: [id number]/ [id number]

## 2023-03-02 NOTE — Transfer of Care (Signed)
Immediate Anesthesia Transfer of Care Note  Patient: Walter Vargas  Procedure(s) Performed: EXPLORATION POST OPERATIVE OPEN HEART  Patient Location: ICU  Anesthesia Type:General  Level of Consciousness: Patient remains intubated per anesthesia plan  Airway & Oxygen Therapy: Patient remains intubated per anesthesia plan and Patient placed on Ventilator (see vital sign flow sheet for setting)  Post-op Assessment: Report given to RN and Post -op Vital signs reviewed and stable  Post vital signs: Reviewed and stable  Last Vitals:  Vitals Value Taken Time  BP    Temp    Pulse    Resp    SpO2      Last Pain:  Vitals:   03/01/23 1915  TempSrc: Core  PainSc:       Patients Stated Pain Goal: 0 (02/24/23 1914)  Complications: No notable events documented.

## 2023-03-02 NOTE — Progress Notes (Signed)
PT Cancellation Note  Patient Details Name: Walter Vargas MRN: [id number] DOB: 01-23-1946   Cancelled Treatment:    Reason Eval/Treat Not Completed: Patient at procedure or test/unavailable;Medical issues which prohibited therapy;Patient not medically ready (Pt back to OR due to bleeding for surgery. Will hold at this time. Will continue to follow up as able and appropriate.)  Harrel Carina, DPT, CLT  Acute Rehabilitation Services Office: [phone] (Secure chat preferred)    Claudia Desanctis 03/02/2023, 12:07 PM

## 2023-03-02 NOTE — Plan of Care (Signed)
 Problem: Health Behavior/Discharge Planning: Goal: Ability to manage health-related needs will improve Outcome: Progressing   Problem: Clinical Measurements: Goal: Ability to maintain clinical measurements within normal limits will improve Outcome: Progressing Goal: Will remain free from infection Outcome: Progressing Goal: Diagnostic test results will improve Outcome: Progressing Goal: Respiratory complications will improve Outcome: Progressing Goal: Cardiovascular complication will be avoided Outcome: Progressing   Problem: Activity: Goal: Risk for activity intolerance will decrease Outcome: Progressing   Problem: Coping: Goal: Level of anxiety will decrease Outcome: Progressing   Problem: Elimination: Goal: Will not experience complications related to bowel motility Outcome: Progressing Goal: Will not experience complications related to urinary retention Outcome: Progressing   Problem: Pain Management: Goal: General experience of comfort will improve Outcome: Progressing   Problem: Safety: Goal: Ability to remain free from injury will improve Outcome: Progressing   Problem: Skin Integrity: Goal: Risk for impaired skin integrity will decrease Outcome: Progressing   Problem: Education: Goal: Knowledge of disease or condition will improve Outcome: Progressing Goal: Understanding of medication regimen will improve Outcome: Progressing Goal: Individualized Educational Video(s) Outcome: Progressing   Problem: Activity: Goal: Ability to tolerate increased activity will improve Outcome: Progressing   Problem: Cardiac: Goal: Ability to achieve and maintain adequate cardiopulmonary perfusion will improve Outcome: Progressing   Problem: Health Behavior/Discharge Planning: Goal: Ability to safely manage health-related needs after discharge will improve Outcome: Progressing   Problem: Education: Goal: Will demonstrate proper wound care and an understanding of  methods to prevent future damage Outcome: Progressing Goal: Knowledge of disease or condition will improve Outcome: Progressing Goal: Knowledge of the prescribed therapeutic regimen will improve Outcome: Progressing Goal: Individualized Educational Video(s) Outcome: Progressing   Problem: Activity: Goal: Risk for activity intolerance will decrease Outcome: Progressing   Problem: Cardiac: Goal: Will achieve and/or maintain hemodynamic stability Outcome: Progressing   Problem: Clinical Measurements: Goal: Postoperative complications will be avoided or minimized Outcome: Progressing   Problem: Respiratory: Goal: Respiratory status will improve Outcome: Progressing   Problem: Skin Integrity: Goal: Wound healing without signs and symptoms of infection Outcome: Progressing Goal: Risk for impaired skin integrity will decrease Outcome: Progressing   Problem: Urinary Elimination: Goal: Ability to achieve and maintain adequate renal perfusion and functioning will improve Outcome: Progressing   Problem: Education: Goal: Knowledge of General Education information will improve Description: Including pain rating scale, medication(s)/side effects and non-pharmacologic comfort measures Outcome: Progressing   Problem: Health Behavior/Discharge Planning: Goal: Ability to manage health-related needs will improve Outcome: Progressing   Problem: Clinical Measurements: Goal: Ability to maintain clinical measurements within normal limits will improve Outcome: Progressing Goal: Will remain free from infection Outcome: Progressing Goal: Diagnostic test results will improve Outcome: Progressing Goal: Respiratory complications will improve Outcome: Progressing Goal: Cardiovascular complication will be avoided Outcome: Progressing   Problem: Activity: Goal: Risk for activity intolerance will decrease Outcome: Progressing   Problem: Nutrition: Goal: Adequate nutrition will be  maintained Outcome: Progressing   Problem: Coping: Goal: Level of anxiety will decrease Outcome: Progressing   Problem: Elimination: Goal: Will not experience complications related to bowel motility Outcome: Progressing Goal: Will not experience complications related to urinary retention Outcome: Progressing   Problem: Pain Management: Goal: General experience of comfort will improve Outcome: Progressing   Problem: Safety: Goal: Ability to remain free from injury will improve Outcome: Progressing   Problem: Skin Integrity: Goal: Risk for impaired skin integrity will decrease Outcome: Progressing   Problem: Education: Goal: Knowledge of risk factors and measures for prevention of condition will improve Outcome:  Progressing

## 2023-03-02 NOTE — Brief Op Note (Signed)
02/14/2023 - 03/02/2023  1:21 AM  PATIENT:  Walter Vargas  77 y.o. male  PRE-OPERATIVE DIAGNOSIS:  Postoperative bleeding  POST-OPERATIVE DIAGNOSIS:  Postoperative bleeding  PROCEDURE:  Procedure(s): EXPLORATION POST OPERATIVE OPEN HEART (N/A)  SURGEON:  Surgeons and Role:    * [name], MD - Primary  PHYSICIAN ASSISTANT:   ASSISTANTS: Alfonso Patten, MD   ANESTHESIA:   general  EBL:  850 ml   BLOOD ADMINISTERED:  4 units of PRBC,  4 units FFP, 2 units cryo and 2 platelets  DRAINS:  3 Chest Tube(s) in the Left pleura and mediastinum    LOCAL MEDICATIONS USED:  NONE  SPECIMEN:  No Specimen  DISPOSITION OF SPECIMEN:  N/A  COUNTS:  YES  TOURNIQUET:  * No tourniquets in log *  DICTATION: .Other Dictation: Dictation Number -  PLAN OF CARE: Admit to inpatient   PATIENT DISPOSITION:  ICU - intubated and critically ill.   Delay start of Pharmacological VTE agent (>24hrs) due to surgical blood loss or risk of bleeding: yes

## 2023-03-02 NOTE — Op Note (Unsigned)
NAMESUSHANT, Walter Vargas MEDICAL RECORD NO: [id number] ACCOUNT NO: [id number] DATE OF BIRTH: 1945-06-11 FACILITY: [facility] LOCATION: [facility] PHYSICIAN: Salvatore Decent. Dorris Fetch, MD  Operative Report   DATE OF PROCEDURE: 03/02/2023  PREOPERATIVE DIAGNOSIS:  Postoperative bleeding after coronary artery bypass grafting.  POSTOPERATIVE DIAGNOSIS:  Postoperative bleeding after coronary artery bypass grafting.  PROCEDURE:  Re-exploration of mediastinum for bleeding.  SURGEON:  Salvatore Decent.  Dorris Fetch, MD  ASSISTANT:  Darnell Level, MD  Experienced assistance was necessary for this case due to surgical complexity, Darnell Level assisted with exposure, retraction of delicate tissues, suctioning, and wound closure.  I was present for the entire case and performed all of the critical steps of the case.  ANESTHESIA:  General.  FINDINGS:  Approximately 400 mL of blood in the left pleural space, arterial blood in the anterior mediastinum with severe coagulopathy.  Several small bleeding sites noted under the right hemisternum and around chest tube sites.  No bleeding at anastomoses.  No definitive culprit vessel identified.  CLINICAL NOTE:  Mr. Grube is a 77 year old gentleman who had undergone coronary artery bypass grafting and Impella ventricular assist device placement earlier on 03/01/2023.  He had coagulopathic bleeding and then even after administration of blood products had ongoing bleeding.  The situation was discussed with his family who gave consent for re-exploration for bleeding.    OPERATIVE NOTE: Mr. Lilja was transported from the ICU to the operating room.  He was placed on the operative bed.  Anesthesia was induced.  The dressings were removed.  The chest, abdomen, and left thigh were prepped and draped in the usual sterile fashion.  A timeout was performed.  The sternotomy wound was reopened.  The sternal wires were removed.  On opening the chest, there was red non-clotting blood in the  anterior mediastinum and with placement of the sucker into the left pleural space, there was approximately 400 mL of non-clotting blood in the left pleural space.  The sternum and area under the sternum were inspected for hemostasis.  There were some bleeding sites under the manubrium on the right.  These did not appear to be major bleeding sites, but did have significant ongoing oozing.  These were cauterized and sutured respectively.  Systematic inspection was made of the left internal mammary bed and there was some minor bleeding in that area.  There was some bleeding around the [place] drain insertion site, which was sutured.  The cannulation sites were intact with no bleeding as were the proximal anastomosis and the anastomosis for the Hemoshield graft on the aorta.  The internal mammary was inspected along its length and there were no bleeding sites noted there.  There was no significant bleeding from behind the heart, so no attempt was made to elevate the heart due to risk of injury to the mammary artery or other grafts.  Correction of coagulopathy continued guided by thromboelastogram.  A total of 4 units of packed red blood cells, 4 units of FFP, 2 units of cryoprecipitate, and 2 bags of platelets were administered.  After watching for additional bleeding, hemostasis was much improved, although there still was some coagulopathic bleeding.  The tubes were placed back into position and the sternum was closed with a combination of single and double-heavy gauge stainless steel wires.  Pectoralis fascia and subcutaneous tissue were closed in standard fashion.  Before closing the skin, the patient was observed for 30 minutes in the operating room to ensure that there was not ongoing surgical bleeding.  The  bleeding was dramatically improved compared to the bleeding on arrival to the OR.  The skin then was closed in standard fashion.  The patient then was transported from the operating room to the surgical intensive  care unit intubated and in critical condition.  All sponge, needle, and instrument counts were correct at the end of the procedure.   PUS D: 03/02/2023 10:46:11 am T: 03/02/2023 12:03:00 pm  JOB: [id number]/ [id number]

## 2023-03-02 NOTE — Consult Note (Signed)
NAME:  Walter Vargas, MRN:  [id number], DOB:  [birth year], LOS: 16 ADMISSION DATE:  02/14/2023, CONSULTATION DATE:  03/02/23 REFERRING MD:  Dr. Dorris Fetch, CHIEF COMPLAINT:  post op   History of Present Illness:  Encephalopathic, therefore HPI obtained from EMR review.  89 yoM with PMH significant for HTN, MR, HFrEF, and CAD admitted to [facility] 11/30 with acute on chronic HFrEF and NSTEMI.  Found to be in acute Biventricular HF with moderate to severe MR.  Treated with IV heparin, diuresed and transferred to [facility] for CABG evaluation on 12/4, admitted to HF team. Additionally tested positive for COVID 12/7, s/p completion of paxlovid with hospitalization complicated by intermittent delirium.  Underwent CABG x 5 on 12/19 by Dr. Dorris Fetch, complicated by cardiac stunning and failure to wean from bypass s/p with placement of impella 5.5.  On return to ICU, pt coagulopathic with high CT output, VT event w/suction alarm, given multiple blood products, requiring return to OR for re-exploration without obvious source found, suspected due to coagulopathy.  PCCM consulted to assist with vent and medical management postop.   Pertinent  Medical History  HTN, MR, HFrEF, CAD  Significant Hospital Events: Including procedures, antibiotic start and stop dates in addition to other pertinent events    Interim History / Subjective:  On WUA, followed simple commands, MAE  Objective   Blood pressure (!) 103/90, pulse 81, temperature 99 F (37.2 C), resp. rate 20, height 5\' 8"  (1.727 m), weight 67.1 kg, SpO2 99%. PAP: (13-43)/(10-22) 32/16 CVP:  [2 mmHg-42 mmHg] 9 mmHg CO:  [2.9 L/min-4 L/min] 3.1 L/min CI:  [1.74 L/min/m2-2.41 L/min/m2] 1.85 L/min/m2  Vent Mode: SIMV;PSV;PRVC FiO2 (%):  [50 %] 50 % Set Rate:  [16 bmp-20 bmp] 20 bmp Vt Set:  [540 mL] 540 mL PEEP:  [8 cmH20] 8 cmH20 Pressure Support:  [10 cmH20] 10 cmH20 Plateau Pressure:  [20 cmH20-21 cmH20] 21 cmH20   Intake/Output Summary (Last 24  hours) at 03/02/2023 0854 Last data filed at 03/02/2023 0800 Gross per 24 hour  Intake 13938.42 ml  Output 7905 ml  Net 6033.42 ml   Filed Weights   03/01/23 0540 03/01/23 0637 03/02/23 0500  Weight: 53.6 kg 54.9 kg 67.1 kg   Examination: General:  elderly critically ill appearing male sedated on MV in NAD HEENT: MM pink/moist, pupils 3/r, anicteric, ETT/ OGT (clamped/ scant residual bloody output) Neuro: sedated CV: rr ir, dry sternal dressing, pacing wires in place, 3 Cts, left brachial aline, R internal jugular CVL/ PA, R axillary 5.5 impella  PULM:  MV supported, clear anteriorly, +cough GI: soft, bs+, ND, foley -cyu Extremities: warm/dry, trace LE edema  Skin: posterior not visualized.  Small blood blister R forearm, dressing to R arm (from prior blood blister)   Impella 5.5> P9, flow 4.8L, CO 4.8, CVP 9-12, PA 34/16 (23) UOP 3L/ 24hrs   +6L / 24hrs CT output 1.8L/ 12hrs   sodium chloride     sodium chloride 250 mL (03/02/23 0817)   sodium chloride     albumin human     amiodarone 30 mg/hr (03/02/23 0800)    ceFAZolin (ANCEF) IV Stopped (03/02/23 0535)   dexmedetomidine (PRECEDEX) IV infusion 0.7 mcg/kg/hr (03/02/23 0800)   epinephrine 4 mcg/min (03/02/23 0800)   fentaNYL infusion INTRAVENOUS 100 mcg/hr (03/02/23 0800)   insulin 1 Units/hr (03/02/23 0800)   lactated ringers     lactated ringers 20 mL/hr at 03/02/23 0800   milrinone 0.3 mcg/kg/min (03/02/23 0800)   norepinephrine (LEVOPHED)  Adult infusion 6 mcg/min (03/02/23 0800)   sodium bicarbonate 25 mEq (Impella PURGE) in dextrose 5 % 1000 mL bag     vasopressin 0.04 Units/min (03/02/23 0800)   Resolved Hospital Problem list    Assessment & Plan:   Multivessel CAD s/p CABG x5 NSTEMI Post bypass vasoplegia/ cardiogenic shock s/p impella 5.5 Acute on chronic biventricular HF Acute postoperative hypoxic respiratory insufficiency Expected post-operative ABLA with coagulopathy  - intra op TEE EF 20%, RV mildly  reduced - s/p 4u PRBC, 4u FFP, 2u cryo, 2 plts P:  - post-op management per TCTS - EPW/ mediastinal drains per TCTS - impella per TCTS/ HF.  Trending daily lactic and CMP - trending hemodynamics, coox - cont epi, NE, vaso, milrinone per HF/ TCTS, goal MAP > 65 - monitor for further bleeding, CT output - H/H stable thus far.  Transfuse per TCTS - amio per TCTS - optimize electrolytes - ASA/ statin  - complete post-op antibiotics - insulin gtt per endo tool - cont full MV support, will start to wean sedation as hemodynamically tolerated today - VAP/ PPI - CXR in am - monitor electrolytes, replete PRN - PAD protocol for now w/ fentanyl/ precedex, RASS goal 0/ -1 with bowel regimen.  Multimodal pain regimen to resume after extubation - CBC, CMP, coags in am   COVID+ - s/p paxlovid tx, completed 12/12  Delirium - some underlying dementia suspected - delirium precautions/ supportive care   Best Practice (right click and "Reselect all SmartList Selections" daily)   Diet/type: NPO DVT prophylaxis SCD Pressure ulcer(s): N/A GI prophylaxis: PPI Lines: Central line, Arterial Line, and yes and it is still needed Foley:  Yes, and it is still needed Code Status:  full code Last date of multidisciplinary goals of care discussion [per primary team]  Labs   CBC: Recent Labs  Lab 03/01/23 1550 03/01/23 1828 03/01/23 1944 03/01/23 2129 03/01/23 2257 03/01/23 2304 03/02/23 0053 03/02/23 0206 03/02/23 0215 03/02/23 0550  WBC 15.1* 16.2*  --  13.6*  --   --   --  8.5  --  6.4  HGB 8.4* 6.1*   < > 8.6*   < > 5.4* 9.5* 9.6* 8.8* 9.0*  HCT 26.1* 18.9*   < > 25.4*   < > 16.0* 28.0* 28.3* 26.0* 25.8*  MCV 87.6 90.4  --  89.8  --   --   --  88.4  --  85.1  PLT 149*  149* 114*  113*  --  97*  99*  --   --   --  68*  70*  --  68*   < > = values in this interval not displayed.    Basic Metabolic Panel: Recent Labs  Lab 02/28/23 1446 03/01/23 0409 03/01/23 0834 03/01/23 1407  03/01/23 1544 03/01/23 2129 03/01/23 2257 03/01/23 2304 03/02/23 0053 03/02/23 0206 03/02/23 0215 03/02/23 0550  NA 134* 133*   < > 135   < > 134*   < > 138 137 136 135 134*  K 4.6 4.1   < > 4.8   < > 3.7   < > 3.3* 3.7 3.6 3.5 4.2  CL 98 100   < > 100  --  105  --  104  --  104  --  104  CO2 24 25  --   --   --  21*  --   --   --  20*  --  23  GLUCOSE 89 96   < >  169*  --  210*  --  184*  --  187*  --  153*  BUN 22 20   < > 16  --  13  --  12  --  12  --  10  CREATININE 0.84 0.69   < > 0.50*  --  0.87  --  0.40*  --  0.85  --  0.96  CALCIUM 9.5 9.0  --   --   --  7.7*  --   --   --  7.3*  --  7.4*  MG  --   --   --   --   --   --   --   --   --  2.0  --   --    < > = values in this interval not displayed.   GFR: Estimated Creatinine Clearance: 61.2 mL/min (by C-G formula based on SCr of 0.96 mg/dL). Recent Labs  Lab 03/01/23 1828 03/01/23 2129 03/02/23 0206 03/02/23 0550  WBC 16.2* 13.6* 8.5 6.4    Liver Function Tests: Recent Labs  Lab 02/28/23 1446  AST 18  ALT 15  ALKPHOS 59  BILITOT 0.6  PROT 7.1  ALBUMIN 3.4*   No results for input(s): "LIPASE", "AMYLASE" in the last 168 hours. No results for input(s): "AMMONIA" in the last 168 hours.  ABG    Component Value Date/Time   PHART 7.314 (L) 03/02/2023 0215   PCO2ART 40.0 03/02/2023 0215   PO2ART 162 (H) 03/02/2023 0215   HCO3 20.8 03/02/2023 0215   TCO2 22 03/02/2023 0215   ACIDBASEDEF 6.0 (H) 03/02/2023 0215   O2SAT 99 03/02/2023 0215     Coagulation Profile: Recent Labs  Lab 03/01/23 1550 03/01/23 1828 03/01/23 2129 03/02/23 0206  INR 1.8*  1.9* 1.6* 1.1 1.1    Cardiac Enzymes: No results for input(s): "CKTOTAL", "CKMB", "CKMBINDEX", "TROPONINI" in the last 168 hours.  HbA1C: Hgb A1c MFr Bld  Date/Time Value Ref Range Status  02/28/2023 02:46 PM 5.9 (H) 4.8 - 5.6 % Final    Comment:    (NOTE) Pre diabetes:          5.7%-6.4%  Diabetes:              >6.4%  Glycemic control for    <7.0% adults with diabetes   02/19/2023 03:27 AM 6.0 (H) 4.8 - 5.6 % Final    Comment:    (NOTE) Pre diabetes:          5.7%-6.4%  Diabetes:              >6.4%  Glycemic control for   <7.0% adults with diabetes     CBG: Recent Labs  Lab 03/02/23 0312 03/02/23 0416 03/02/23 0528 03/02/23 0646 03/02/23 0809  GLUCAP 155* 100* 104* 141* 145*    Review of Systems:   Unable to review  Past Medical History:  He,  has a past medical history of Essential hypertension, HFrEF (heart failure with reduced ejection fraction) (HCC), and Mitral regurgitation.   Surgical History:   Past Surgical History:  Procedure Laterality Date   NO PAST SURGERIES     RIGHT/LEFT HEART CATH AND CORONARY ANGIOGRAPHY N/A 02/13/2023   Procedure: RIGHT/LEFT HEART CATH AND CORONARY ANGIOGRAPHY;  Surgeon: Yvonne Kendall, MD;  Location: [facility] INVASIVE CV LAB;  Service: Cardiovascular;  Laterality: N/A;     Social History:   reports that he quit smoking about 47 years ago. His smoking use included cigarettes. He started smoking about  57 years ago. He has a 5 pack-year smoking history. He has never used smokeless tobacco. He reports that he does not currently use alcohol. He reports that he does not use drugs.   Family History:  His family history includes Alcohol abuse in his brother and father; Diabetes in his mother; Drug abuse in his brother; Heart disease in his brother; Hypertension in his father.   Allergies No Active Allergies   Home Medications  Prior to Admission medications   Medication Sig Start Date End Date Taking? Authorizing Provider  aspirin EC 81 MG tablet Take 81 mg by mouth daily.   Yes [provider]  metoprolol succinate (TOPROL-XL) 25 MG 24 hr tablet Take 25 mg by mouth daily.   Yes [provider]  Multiple Vitamins-Minerals (MULTIVITAMIN MEN 50+) TABS Take 1 tablet by mouth daily.   Yes [provider]  atorvastatin (LIPITOR) 80 MG tablet Take 1  tablet (80 mg total) by mouth daily. Patient not taking: Reported on 02/14/2023 02/15/23   Leeroy Bock, MD  furosemide (LASIX) 20 MG tablet Take 20 mg by mouth daily. Patient not taking: Reported on 02/14/2023    [provider]  heparin 47829 UT/250ML infusion Inject 1,250 Units/hr into the vein continuous. Patient not taking: Reported on 02/14/2023 02/14/23   Leeroy Bock, MD  spironolactone (ALDACTONE) 25 MG tablet Take 0.5 tablets (12.5 mg total) by mouth daily. Patient not taking: Reported on 02/14/2023 02/15/23   Leeroy Bock, MD     Critical care time: 73 mins     Posey Boyer, MSN, AG-ACNP-BC [facility] 03/02/2023, 8:54 AM  See Amion for pager If no response to pager , please call [phone] until 7pm After 7:00 pm call Elink  [phone]

## 2023-03-02 NOTE — Progress Notes (Signed)
1 Day Post-Op Procedure(s) (LRB): EXPLORATION POST OPERATIVE OPEN HEART (N/A) Subjective: Intubated, has been waking up a little  Objective: Vital signs in last 24 hours: Temp:  [94.1 F (34.5 C)-99 F (37.2 C)] 99 F (37.2 C) (12/20 0700) Pulse Rate:  [75-251] 81 (12/20 0445) Cardiac Rhythm: Normal sinus rhythm;Sinus tachycardia;Ventricular tachycardia (12/19 2000) Resp:  [0-25] 20 (12/20 0700) BP: (71-105)/(39-90) 103/90 (12/19 2200) SpO2:  [78 %-100 %] 99 % (12/20 0751) Arterial Line BP: (49-247)/(43-216) 101/90 (12/20 0700) FiO2 (%):  [50 %] 50 % (12/20 0751) Weight:  [67.1 kg] 67.1 kg (12/20 0500)  Hemodynamic parameters for last 24 hours: PAP: (13-43)/(10-22) 32/16 CVP:  [2 mmHg-42 mmHg] 9 mmHg CO:  [2.9 L/min-4 L/min] 3.1 L/min CI:  [1.74 L/min/m2-2.41 L/min/m2] 1.85 L/min/m2  Intake/Output from previous day: 12/19 0701 - 12/20 0700 In: 13815.4 [I.V.:5060.8; Blood:6068.3; IV Piggyback:2606.2] Out: 7790 [Urine:2955; Blood:1905; Chest Tube:2930] Intake/Output this shift: Total I/O In: 123 [I.V.:113.8; Other:9.2] Out: 115 [Urine:65; Chest Tube:50]  PE  Intubated, sedated Extremities cool, doppler pulses except left DP Cardiac RRR, Impella sounds Lungs clear anteriorly  Lab Results: Recent Labs    03/02/23 0206 03/02/23 0215 03/02/23 0550  WBC 8.5  --  6.4  HGB 9.6* 8.8* 9.0*  HCT 28.3* 26.0* 25.8*  PLT 68*  70*  --  68*   BMET:  Recent Labs    03/02/23 0206 03/02/23 0215 03/02/23 0550  NA 136 135 134*  K 3.6 3.5 4.2  CL 104  --  104  CO2 20*  --  23  GLUCOSE 187*  --  153*  BUN 12  --  10  CREATININE 0.85  --  0.96  CALCIUM 7.3*  --  7.4*    PT/INR:  Recent Labs    03/02/23 0206  LABPROT 14.8  INR 1.1   ABG    Component Value Date/Time   PHART 7.314 (L) 03/02/2023 0215   HCO3 20.8 03/02/2023 0215   TCO2 22 03/02/2023 0215   ACIDBASEDEF 6.0 (H) 03/02/2023 0215   O2SAT 99 03/02/2023 0215   CBG (last 3)  Recent Labs     03/02/23 0528 03/02/23 0646 03/02/23 0809  GLUCAP 104* 141* 145*    Assessment/Plan: S/P Procedure(s) (LRB): EXPLORATION POST OPERATIVE OPEN HEART (N/A) Critically ill NEURO- will assess as sedation lightened CV- decompensated LV systolic and diastolic failure with ischemic cardiomyopathy and EF < 20% S/p CABG, Impella placement On Impella p9 with 5L flow  PA 35/18 CI 2.4  Epi 4, norepi 6, milrinone 0.3, vasopressin 0.04 RESP- on 50% and 8 PEEP  Will decrease PEEP to 5  Consult CCM for vent and medical management  Hopefully can try to wean today RENAL- creatinine normal, good UO  Lytes ok  Will need diuresis ENDO- CBG well controlled  Transition to SSI per protocol GI- NPO Malnutrition- will need enteral nutrition in near future if unable to extubate Anemia - Hgb 9 post transfusion Thrombocytopenia- PLT 68K- transfuse  Coagulopathy- improved Keep CT in place No enoxaparin due to bleeding risk   LOS: 16 days    [name] 03/02/2023

## 2023-03-02 NOTE — Brief Op Note (Signed)
03/01/2023  1:18 AM  PATIENT:  Walter Vargas  77 y.o. male  PRE-OPERATIVE DIAGNOSIS:  CORONARY ARTERY DISEASE, MITRAL REGURGITATION, CONGESTIVE HEART FAILURE  POST-OPERATIVE DIAGNOSIS:  CORONARY ARTERY DISEASE, MITRAL REGURGITATION, CONGESTIVE HEART FAILURE  PROCEDURE:  Procedure(s): CORONARY ARTERY BYPASS GRAFTING X 5, USING LEFT INTERNAL MAMMARY ARTERY AND ENDOSCOPICALLY HARVESTED RIGHT SAPHENOUS VEIN GRAFT (N/A) TRANSESOPHAGEAL ECHOCARDIOGRAM (TEE) (N/A) PLACEMENT OF IMPELLA LEFT VENTRICULAR ASSIST DEVICE   SURGEON:  Surgeons and Role:    * [name], MD - Primary    * Bensimhon, Bevelyn Buckles, MD - Assisting    * Romie Minus, MD - Assisting  PHYSICIAN ASSISTANT: Lowella Dandy, PA-C  ASSISTANTS: Alfonso Patten, MD   ANESTHESIA:   general  EBL:  1055 mL   BLOOD ADMINISTERED: 2 units CC PRBC and 2 units FFP  DRAINS:  3 Chest Tube(s) in the Left pleura and mediastinum    LOCAL MEDICATIONS USED:  NONE  SPECIMEN:  No Specimen  DISPOSITION OF SPECIMEN:  N/A  COUNTS:  YES  TOURNIQUET:  * No tourniquets in log *  DICTATION: .Other Dictation: Dictation Number -  PLAN OF CARE: Admit to inpatient   PATIENT DISPOSITION:  ICU - intubated and critically ill.   Delay start of Pharmacological VTE agent (>24hrs) due to surgical blood loss or risk of bleeding: yes

## 2023-03-02 NOTE — Procedures (Signed)
Extubation Procedure Note  Patient Details:   Name: Walter Vargas DOB: September 18, 1945 MRN: [id number]   Airway Documentation:    Vent end date: 03/02/23 Vent end time: 1448   Evaluation  O2 sats: stable throughout Complications: No apparent complications Patient did tolerate procedure well. Bilateral Breath Sounds: Clear   Yes Pt extubated to 4L [place]. Positive cuff leak noted NIF: - 25cm H2O FVC: 1L Walter Vargas V 03/02/2023, 2:51 PM

## 2023-03-02 NOTE — Progress Notes (Signed)
Initial Nutrition Assessment  DOCUMENTATION CODES:   Severe malnutrition in context of chronic illness  INTERVENTION:  Once xray confirms adequate placement, start trickle TF via Cortrak:  Vital 1.5 at 61ml/hr  Once medically able to advance TF, recommend: Advancing Vital 1.5 to 30ml and then 10ml q8h to goal rate of 71ml/hr (1099ml/day) 60ml ProSource TF20 once daily Provides 1700 kcal, 93g protein, 825 ml free water  NUTRITION DIAGNOSIS:   Severe Malnutrition related to chronic illness (HFrEF, 3V CAD, MR) as evidenced by severe fat depletion, severe muscle depletion.  GOAL:   Patient will meet greater than or equal to 90% of their needs  MONITOR:   Labs, Vent status, Weight trends, TF tolerance, I & O's  REASON FOR ASSESSMENT:   Ventilator    ASSESSMENT:   Pt remains admitted with heart failure, NSTEMI and cardiogenic shock. PMH significant for HTN, MR, HFrEF and severe 3V CAD.  11/30: admitted 12/19: s/p CABG and impella 5/5 placement 12/20: back to OR for re-exploration d/t post-op bleeding/ABLA; cortrak placed  Spoke with RN at bedside. Monitoring chest tube output which is reduced from day prior. Plans to lighten sedation and wean for possible extubation today. Unfortunately unable to advance OGT with side port remaining GEJ. MD agreeable to Cortrak placement and initiation of trickle TF.   No family/visitors present at time of visit to obtain detailed nutrition related history from at time of visit. Patient noted to have fluctuating confusion throughout admission. CABG had also been delayed d/t COVID +.   Patient is currently intubated on ventilator support MV: 11.1 L/min Temp (24hrs), Avg:97.5 F (36.4 C), Min:94.1 F (34.5 C), Max:99.7 F (37.6 C) MAP (a-line) 91 Diastolic BP 84   Admit weight: 56.5 kg Current weight: 67.1 kg  Question accuracy of weight measurement today as weight had been trending down and was documented to be 54.9 kg yesterday.    Drains/lines: A-line RIJ L chest tube Cortrak (xray pending)  Medications: dulcolax, colace, reglan q6h started 12/19, IV protonix daily Drips:  Amio Precedex Epi @ 51mcg/min Insulin drip Milrinone Levo @ 26mcg/min Vaso @ 0.04 units/min  Labs:   Sodium 134 Ionized Ca 1.05 Alkaline phos 27 AST 51 Hgb 9.0 CBG's 100-145 x24 hours  UOP: x24 hours Chest tube output: x24 hours + x6 hours  NUTRITION - FOCUSED PHYSICAL EXAM:  Flowsheet Row Most Recent Value  Orbital Region Severe depletion  Upper Arm Region Severe depletion  Thoracic and Lumbar Region Severe depletion  Buccal Region Severe depletion  Temple Region Severe depletion  Clavicle Bone Region Moderate depletion  Clavicle and Acromion Bone Region Severe depletion  Scapular Bone Region Moderate depletion  Dorsal Hand Unable to assess  [edema]  Patellar Region Moderate depletion  Anterior Thigh Region Severe depletion  Posterior Calf Region Unable to assess  [BLE,  pitting edema on feet]  Edema (RD Assessment) Mild  Hair Reviewed  Eyes Unable to assess  Mouth Unable to assess  Skin Reviewed  Nails Reviewed   Diet Order:   Diet Order     None       EDUCATION NEEDS:   No education needs have been identified at this time  Skin:  Skin Assessment: Skin Integrity Issues: Skin Integrity Issues:: Incisions Incisions: Chest, R leg inicision closed  Last BM:  12/17 type 4 large  Height:   Ht Readings from Last 1 Encounters:  03/01/23 5\' 8"  (1.727 m)    Weight:   Wt Readings from Last 1  Encounters:  03/02/23 67.1 kg   BMI:  Body mass index is 22.49 kg/m.  Estimated Nutritional Needs:   Kcal:  1700-1900  Protein:  85-100g  Fluid:  1.7L  Drusilla Kanner, RDN, LDN Clinical Nutrition

## 2023-03-02 NOTE — Progress Notes (Signed)
      [address]       [name] [address]             [phone]      POD # 1  Extubated, communicating well moving all 4 ext "I need Lasix"  BP (!) 103/90   Pulse 82   Temp 100 F (37.8 C)   Resp (!) 22   Ht 5\' 8"  (1.727 m)   Wt 67.1 kg   SpO2 99%   BMI 22.49 kg/m  36/11 CI 2.57  Impella p8 4.6 L/min On epi 3, norepi 6, vasopressin 0.04, milrinone 0.375  Intake/Output Summary (Last 24 hours) at 03/02/2023 1642 Last data filed at 03/02/2023 1600 Gross per 24 hour  Intake 11629.85 ml  Output 5965 ml  Net 5664.85 ml   ~ 50 ml/ hr serosanguinous from chest tubes  PM labs pending  Will need diuresis but not ready yet- d/w Dr. Gerlean Ren [name], MD [facility] [phone]

## 2023-03-02 NOTE — Procedures (Signed)
Cortrak  Person Inserting Tube:  Maylon Peppers C, RD Tube Type:  Cortrak - 43 inches Tube Size:  10 Tube Location:  Left nare Secured by: Bridle Technique Used to Measure Tube Placement:  Marking at nare/corner of mouth Cortrak Secured At:  74 cm   Cortrak Tube Team Note:  Consult received to place a Cortrak feeding tube.   X-ray is required, abdominal x-ray has been ordered by the Cortrak team. Please confirm tube placement before using the Cortrak tube.   If the tube becomes dislodged please keep the tube and contact the Cortrak team at [email or web address] for replacement.  If after hours and replacement cannot be delayed, place a NG tube and confirm placement with an abdominal x-ray.    Lockie Pares., RD, LDN, CNSC See AMiON for contact information

## 2023-03-02 NOTE — Progress Notes (Signed)
Advanced Heart Failure Rounding Note  PCP-Cardiologist: None  Chief Complaint: Cardiogenic Shock  Subjective:    12/19: S/p CABG and Impella 5.5 Insertion. Coagulopathic on return from OR, high chest tube output, required multiple blood products. Subsequent VT associated w/ Impella suction alarms. Back to OR for re-exploratation, bleeding from EPW.   POD# 1. Intubated and sedated   800 CT output overnight (slowing).   Hgb 9.0 Plts 68K   Impella at P-9, Flow 4.9L. No further suction alarms overnight.   On Epi 4, NE 6, Milrinone 0.375, and Vaso 0.04. MAPs 80.   Swan#s:  CVP 9 PAP 28/14 (20) CO 3.68 CI 2.23   Objective:   Weight Range: 67.1 kg Body mass index is 22.49 kg/m.   Vital Signs:   Temp:  [94.1 F (34.5 C)-99 F (37.2 C)] 99 F (37.2 C) (12/20 0700) Pulse Rate:  [54-251] 81 (12/20 0445) Resp:  [0-25] 20 (12/20 0700) BP: (71-105)/(39-90) 103/90 (12/19 2200) SpO2:  [78 %-100 %] 99 % (12/20 0447) Arterial Line BP: (49-247)/(43-216) 101/90 (12/20 0700) FiO2 (%):  [50 %] 50 % (12/20 0447) Weight:  [67.1 kg] 67.1 kg (12/20 0500) Last BM Date : 02/27/23  Weight change: Filed Weights   03/01/23 0540 03/01/23 0637 03/02/23 0500  Weight: 53.6 kg 54.9 kg 67.1 kg   Intake/Output:   Intake/Output Summary (Last 24 hours) at 03/02/2023 0715 Last data filed at 03/02/2023 0700 Gross per 24 hour  Intake 13380.44 ml  Output 7790 ml  Net 5590.44 ml    Physical Exam   General:  critically ill, intubated and sedated  HEENT: + ETT  Neck: supple.  + RIJ Swan. Rt subclavian 5.5 Impella Carotids 2+ bilat; no bruits. No lymphadenopathy or thyromegaly appreciated. Cor: PMI nondisplaced. Regular rate & rhythm. No rubs, gallops or murmurs. + sternal dressing, + EWP, + CTs  Lungs: intubated and clear  Abdomen: soft, nontender, nondistended. No hepatosplenomegaly. No bruits or masses. Good bowel sounds. Extremities: no cyanosis, clubbing, rash, edema, decreased  palpable DPs (audible by doppler) Neuro: intubated and sedated GU: + foley    Telemetry   NSR 80s, 2 brief runs of NSVT this morning ( 5 and 7 beats) (personally reviewed)  Labs    CBC Recent Labs    03/02/23 0206 03/02/23 0215 03/02/23 0550  WBC 8.5  --  6.4  HGB 9.6* 8.8* 9.0*  HCT 28.3* 26.0* 25.8*  MCV 88.4  --  85.1  PLT 68*  70*  --  68*   Basic Metabolic Panel Recent Labs    62/13/08 0206 03/02/23 0215 03/02/23 0550  NA 136 135 134*  K 3.6 3.5 4.2  CL 104  --  104  CO2 20*  --  23  GLUCOSE 187*  --  153*  BUN 12  --  10  CREATININE 0.85  --  0.96  CALCIUM 7.3*  --  7.4*  MG 2.0  --   --    Liver Function Tests Recent Labs    02/28/23 1446  AST 18  ALT 15  ALKPHOS 59  BILITOT 0.6  PROT 7.1  ALBUMIN 3.4*    No results for input(s): "LIPASE", "AMYLASE" in the last 72 hours. Cardiac Enzymes No results for input(s): "CKTOTAL", "CKMB", "CKMBINDEX", "TROPONINI" in the last 72 hours.  BNP: BNP (last 3 results) Recent Labs    02/10/23 1120  BNP >4,500.0*   ProBNP (last 3 results) Recent Labs    10/12/22 1030 10/19/22 0852 01/08/23 0957  PROBNP 2,878.0* 2,297.0* >4978.0*   D-Dimer Recent Labs    03/01/23 2129 03/02/23 0206  DDIMER 0.74* 0.54*   Hemoglobin A1C Recent Labs    02/28/23 1446  HGBA1C 5.9*    Fasting Lipid Panel No results for input(s): "CHOL", "HDL", "LDLCALC", "TRIG", "CHOLHDL", "LDLDIRECT" in the last 72 hours. Thyroid Function Tests No results for input(s): "TSH", "T4TOTAL", "T3FREE", "THYROIDAB" in the last 72 hours.  Invalid input(s): "FREET3"  Other results:  Imaging   DG CHEST PORT 1 VIEW Result Date: 03/02/2023 CLINICAL DATA:  Chest tube placement EXAM: PORTABLE CHEST 1 VIEW COMPARISON:  06/30/2022 FINDINGS: Endotracheal tube 3.6 cm above the carina. Nasogastric tube tip is seen within the expected distal esophagus with proximal sidehole within the subcarinal region. Right internal jugular Swan-Ganz  catheter is in place with its tip within the probable right pulmonary artery, obscured by overlying lines and tubes. Right subclavian Impella left ventricular assist device is in expected position across the aortic valve plane. Mediastinal drain and dual left basilar chest tubes are in place. The lungs are symmetrically well expanded. Trace right pleural effusion. Pulmonary infiltrate throughout the right lung is present most in keeping with asymmetric pulmonary edema. No pneumothorax. No pleural effusion on the left. Coronary artery bypass grafting has been performed. Cardiac size within normal limits. IMPRESSION: 1. Support lines and tubes in expected position as described above. 2. Nasogastric tube tip within the expected distal esophagus. Recommend advancement by at least 20 cm. 3. Asymmetric pulmonary edema. Trace right pleural effusion. 4. Left chest tubes in place.  No pneumothorax.  The Electronically Signed   By: Helyn Numbers M.D.   On: 03/02/2023 03:58   DG Abd 1 View Result Date: 03/02/2023 CLINICAL DATA:  Orogastric tube placement EXAM: ABDOMEN - 1 VIEW COMPARISON:  None Available. FINDINGS: Nasogastric tube tip seen within the expected gastroesophageal junction and proximal side hole within the distal esophagus. The abdominal gas pattern is indeterminate due to a paucity of intra-abdominal gas. No free intraperitoneal gas within the upper. IMPRESSION: 1. Nasogastric tube tip within the expected gastroesophageal junction and proximal side hole within the distal esophagus. Advancement by at least 20 cm is recommended. Electronically Signed   By: Helyn Numbers M.D.   On: 03/02/2023 00:29   DG Chest Port 1 View Result Date: 03/01/2023 CLINICAL DATA:  Status post CABG. EXAM: PORTABLE CHEST 1 VIEW COMPARISON:  Chest x-ray 02/28/2023 FINDINGS: Endotracheal tube tip is 3 cm above the carina. Enteric tube tip is at the level of the distal esophagus. Swan-Ganz catheter tip projects over the main  pulmonary artery. Large bore catheter tip projects over the left heart. Left-sided chest tube and mediastinal drain are present. The cardiomediastinal silhouette is mildly enlarged with new sternotomy wires compatible with recent surgery. The lungs are clear. There is no pleural effusion or pneumothorax. No acute fractures are seen. IMPRESSION: 1. Enteric tube tip is at the level of the distal esophagus. Recommend advancement. 2. Additional support apparatus as above. 3. No acute cardiopulmonary process. Electronically Signed   By: Darliss Cheney M.D.   On: 03/01/2023 19:14   ECHO INTRAOPERATIVE TEE Result Date: 03/01/2023  *INTRAOPERATIVE TRANSESOPHAGEAL REPORT *  Patient Name:   Walter Vargas  Date of Exam: 03/01/2023 Medical Rec #:  [id number]      Height:       68.0 in Accession #:    [id number]     Weight:       121.0 lb Date of Birth:  [birth year]      BSA:          1.65 m Patient Age:    77 years       BP:           ./. mmHg Patient Gender: M              HR:           63 bpm. Exam Location:  Anesthesiology Transesophogeal exam was perform intraoperatively during surgical procedure. Patient was closely monitored under general anesthesia during the entirety of examination. Indications:     CAD Sonographer:     Dondra Prader RVT RCS Performing Phys: [id number] STEVEN C HENDRICKSON Complications: No known complications during this procedure. POST-OP IMPRESSIONS Overall, there were no significant changes from pre-bypass. _ Left Ventricle: has severely reduced systolic function, with an ejection fraction of 20%. _ Comments: Impella is across the aortic valve in good position. Functioning well. PRE-OP FINDINGS  Left Ventricle: The left ventricle Less than 20. The cavity size was severely dilated. There is no increase in left ventricular wall thickness. There is no left ventricular hypertrophy.  LV Wall Scoring: The entire septum, entire inferior wall, and apex are akinetic. The entire anterior wall and entire lateral wall  are hypokinetic.  Right Ventricle: The right ventricle has mildly reduced systolic function. The cavity was normal. There is no increase in right ventricular wall thickness. Left Atrium: Left atrial size was not assessed. No left atrial/left atrial appendage thrombus was detected. The left atrial appendage is well visualized and there is no evidence of thrombus present. Right Atrium: Right atrial size was not assessed. Interatrial Septum: No atrial level shunt detected by color flow Doppler. Pericardium: There is no evidence of pericardial effusion. Mitral Valve: The mitral valve is normal in structure. Mitral valve regurgitation is mild by color flow Doppler. The Walter jet is centrally-directed. There is No evidence of mitral stenosis. Tricuspid Valve: The tricuspid valve was normal in structure. Tricuspid valve regurgitation was not visualized by color flow Doppler. Aortic Valve: The aortic valve is tricuspid Aortic valve regurgitation was not visualized by color flow Doppler. Pulmonic Valve: The pulmonic valve was normal in structure. Pulmonic valve regurgitation is not visualized by color flow Doppler. +--------------+--------++ LEFT VENTRICLE         +--------------+--------++ PLAX 2D                +--------------+--------++ LVOT diam:    1.70 cm  +--------------+--------++ LVOT Area:    2.27 cm +--------------+--------++                        +--------------+--------++  +-------------+-------++ AORTA                +-------------+-------++ Ao Root diam:3.70 cm +-------------+-------++ +-------------+---------++ MITRAL VALVE           +--------------+-------+ +-------------+---------++ SHUNTS                MV Peak grad:1.8 mmHg  +--------------+-------+ +-------------+---------++ Systemic Diam:1.70 cm MV Mean grad:1.0 mmHg  +--------------+-------+ +-------------+---------++ MV Vmax:     0.67 m/s  +-------------+---------++ MV Vmean:    38.2 cm/s  +-------------+---------++ MV VTI:      0.21 m    +-------------+---------++  Gaynelle Adu MD Electronically signed by Gaynelle Adu MD Signature Date/Time: 03/01/2023/3:54:04 PM    Final    DG C-Arm 1-60 Min-No Report Result Date: 03/01/2023 Fluoroscopy was utilized by the requesting physician.  No radiographic  interpretation.   Medications:    Scheduled Medications:  sodium chloride   Intravenous Once   sodium chloride   Intravenous Once   sodium chloride   Intravenous Once   sodium chloride   Intravenous Once   sodium chloride   Intravenous Once   sodium chloride   Intravenous Once   sodium chloride   Intravenous Once   sodium chloride   Intravenous Once   sodium chloride   Intravenous Once   sodium chloride   Intravenous Once   sodium chloride   Intravenous Once   sodium chloride   Intravenous Once   acetaminophen  1,000 mg Oral Q6H   Or   acetaminophen (TYLENOL) oral liquid 160 mg/5 mL  1,000 mg Per Tube Q6H   aspirin  324 mg Oral Once   aspirin EC  325 mg Oral Daily   Or   aspirin  324 mg Per Tube Daily   atorvastatin  80 mg Oral Daily   bisacodyl  10 mg Oral Daily   Or   bisacodyl  10 mg Rectal Daily   Chlorhexidine Gluconate Cloth  6 each Topical Daily   docusate sodium  200 mg Oral Daily   metoCLOPramide (REGLAN) injection  10 mg Intravenous Q6H   mouth rinse  15 mL Mouth Rinse Q2H   [START ON 03/03/2023] pantoprazole  40 mg Oral Daily   pantoprazole (PROTONIX) IV  40 mg Intravenous QHS   protamine       sodium chloride flush  3 mL Intravenous Q12H   Infusions:  sodium chloride     sodium chloride     sodium chloride     albumin human     amiodarone 30 mg/hr (03/02/23 0700)   calcium gluconate in NaCl      ceFAZolin (ANCEF) IV Stopped (03/02/23 0535)   dexmedetomidine (PRECEDEX) IV infusion 0.7 mcg/kg/hr (03/02/23 0700)   epinephrine 4 mcg/min (03/02/23 0700)   fentaNYL infusion INTRAVENOUS 50 mcg/hr (03/02/23 0700)   insulin 1 Units/hr  (03/02/23 0700)   lactated ringers     lactated ringers 20 mL/hr at 03/02/23 0700   milrinone 0.3 mcg/kg/min (03/02/23 0700)   norepinephrine (LEVOPHED) Adult infusion 6 mcg/min (03/02/23 0700)   sodium bicarbonate 25 mEq (Impella PURGE) in dextrose 5 % 1000 mL bag     vasopressin 0.04 Units/min (03/02/23 0700)   PRN Medications: sodium chloride, albumin human, calcium gluconate in NaCl, dextrose, fentaNYL, midazolam, morphine injection, ondansetron (ZOFRAN) IV, mouth rinse, oxyCODONE, protamine, sodium chloride flush, traMADol  Patient Profile   Walter Vargas is a 77 y.o. history of HTN, Walter, HFrEF, and severe 3V CAD.  S/p CABG and Impella 5.5 Insertion 12/19 w D.r Dorris Fetch.   Assessment/Plan   Cardiogenic Postoperative Shock - Biventricular failure at b/l. Pre CABG echo EF < 20%, RV moderately reduced (PAPi was adequate on RHC) - Intra-op course complicated by cardiac stun and failure to wean from bypass. Impella 5.5 placed.  - intra op TEE EF 20%, RV mildly reduced  - Impella 5.5 @ P9. Flow ~4.9LPM. - on Epi 4, NE 6, Milrinone 0.375, and Vaso 0.04 - CI 2.2, CVP 9  - continue current Impella and inotropic support support  - no diuresis today   2. CAD: Patient admitted with NSTEMI (?culprit D2) but with severe extensive diffuse 3 vessel disease.  Disease too extensive to treat percutaneously.  Anatomy felt best served by CABG. - s/p CABG and Impella 5.5 insertion w Dr. Dorris Fetch 12/19. - Vent management per  CCM.  - Continue ASA 352  - Held statin w/ mildly elevated LFTs while being treated w/ Plaxlovid. Will need to restart prior to discharge. - no ? blocker w/ shock   3. Acute on chronic systolic CHF: Patient has had HF symptoms for at least a couple of months.  Suspect pre-existing ischemic cardiomyopathy then he presented this admission with NSTEMI possible in D2 territory leading to CHF exacerbation. RHC 12/3 showed normal filling pressures and low CI at 2.0. Echo 12/4: EF  20%, RV mild-moderately dysfunctional, mild-moderate Walter.  - in CGS as above. - Hold diuretics, need to maintain adequate preload in setting of blood loss  - Hold GDMT with pressor requirement (prev on: digoxin 0.125, spiro 25 and metoprolol XL12.5) - Hold on adding SGLT2i and ARB/ARNi until post CABG  - Will need repeat Echo closer to discharge  - Follow renal function/ UOP   4. Post-Operative Bleeding/ ABLA -coagulopathic on return from OR. ~1500cc our from CTs over first few hours  - Supported with multiple units of blood products including RBCs, cryo, FFP and Factor VII + albumin and protamine - Back to OR for re-exploration, bleeding from EPW, bleeding controlled  - CT OP slowing, 800 cc overnight. Hgb stable at 9.0. Plts 68K  - continue to monitor closely   5. 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  Patient has had on and off confusion in hospital.  - He would be at risk for worsening post-op delirium in setting of CABG.  - will reassess once off sedation    6. Gallbladder disease: Patient had gallstones but HIDA scan not suggestive of cholecystitis.  7. COVID+: Completed Paxlovid  8. Fall: Had inpatient fall. No injuries. Did not hit his head.   9. 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  10. Acute Hypoxic Respiratory Failure - intubated, FiO2 50%, PEEP 8       Length of Stay: [address] PA-C  03/02/2023, 7:15 AM  Advanced Heart Failure Team Pager [phone] (M-F; 7a - 5p)  Please contact [facility] for night-coverage after hours (5p -7a ) and weekends on [email or web address]

## 2023-03-03 ENCOUNTER — Inpatient Hospital Stay (HOSPITAL_COMMUNITY): Payer: 59

## 2023-03-03 DIAGNOSIS — R570 Cardiogenic shock: Secondary | ICD-10-CM

## 2023-03-03 DIAGNOSIS — E43 Unspecified severe protein-calorie malnutrition: Secondary | ICD-10-CM | POA: Insufficient documentation

## 2023-03-03 DIAGNOSIS — I509 Heart failure, unspecified: Secondary | ICD-10-CM

## 2023-03-03 LAB — PREPARE CRYOPRECIPITATE: Unit division: 0

## 2023-03-03 LAB — PREPARE FRESH FROZEN PLASMA
Unit division: 0
Unit division: 0
Unit division: 0

## 2023-03-03 LAB — PREPARE PLATELET PHERESIS: Unit division: 0

## 2023-03-03 LAB — BPAM FFP
Blood Product Expiration Date: 202412242359
Blood Product Expiration Date: 202412242359
Blood Product Expiration Date: 202412242359
Blood Product Expiration Date: 202412242359
ISSUE DATE / TIME: 202412192239
ISSUE DATE / TIME: 202412192239
ISSUE DATE / TIME: 202412200114
ISSUE DATE / TIME: 202412200114
Unit Type and Rh: 6200
Unit Type and Rh: 6200
Unit Type and Rh: 6200
Unit Type and Rh: 6200

## 2023-03-03 LAB — CBC
HCT: 21.4 % — ABNORMAL LOW (ref 39.0–52.0)
HCT: 23.3 % — ABNORMAL LOW (ref 39.0–52.0)
Hemoglobin: 7.6 g/dL — ABNORMAL LOW (ref 13.0–17.0)
Hemoglobin: 8.1 g/dL — ABNORMAL LOW (ref 13.0–17.0)
MCH: 30.4 pg (ref 26.0–34.0)
MCH: 30 pg (ref 26.0–34.0)
MCHC: 35.5 g/dL (ref 30.0–36.0)
MCHC: 34.8 g/dL (ref 30.0–36.0)
MCV: 85.6 fL (ref 80.0–100.0)
MCV: 86.3 fL (ref 80.0–100.0)
Platelets: 88 10*3/uL — ABNORMAL LOW (ref 150–400)
Platelets: 75 10*3/uL — ABNORMAL LOW (ref 150–400)
RBC: 2.5 MIL/uL — ABNORMAL LOW (ref 4.22–5.81)
RBC: 2.7 MIL/uL — ABNORMAL LOW (ref 4.22–5.81)
RDW: 16.4 % — ABNORMAL HIGH (ref 11.5–15.5)
RDW: 16.3 % — ABNORMAL HIGH (ref 11.5–15.5)
WBC: 10 10*3/uL (ref 4.0–10.5)
WBC: 9.8 10*3/uL (ref 4.0–10.5)
nRBC: 0 % (ref 0.0–0.2)
nRBC: 0 % (ref 0.0–0.2)

## 2023-03-03 LAB — COMPREHENSIVE METABOLIC PANEL
ALT: 11 U/L (ref 0–44)
AST: 39 U/L (ref 15–41)
Albumin: 2.5 g/dL — ABNORMAL LOW (ref 3.5–5.0)
Alkaline Phosphatase: 33 U/L — ABNORMAL LOW (ref 38–126)
Anion gap: 4 — ABNORMAL LOW (ref 5–15)
BUN: 13 mg/dL (ref 8–23)
CO2: 23 mmol/L (ref 22–32)
Calcium: 7.4 mg/dL — ABNORMAL LOW (ref 8.9–10.3)
Chloride: 101 mmol/L (ref 98–111)
Creatinine, Ser: 0.63 mg/dL (ref 0.61–1.24)
GFR, Estimated: >60 mL/min (ref >60)
Glucose, Bld: 138 mg/dL — ABNORMAL HIGH (ref 70–99)
Potassium: 4.2 mmol/L (ref 3.5–5.1)
Sodium: 128 mmol/L — ABNORMAL LOW (ref 135–145)
Total Bilirubin: 0.9 mg/dL (ref <1.2)
Total Protein: 4.4 g/dL — ABNORMAL LOW (ref 6.5–8.1)

## 2023-03-03 LAB — BASIC METABOLIC PANEL
Anion gap: 8 (ref 5–15)
BUN: 17 mg/dL (ref 8–23)
CO2: 22 mmol/L (ref 22–32)
Calcium: 7.7 mg/dL — ABNORMAL LOW (ref 8.9–10.3)
Chloride: 97 mmol/L — ABNORMAL LOW (ref 98–111)
Creatinine, Ser: 0.6 mg/dL — ABNORMAL LOW (ref 0.61–1.24)
GFR, Estimated: >60 mL/min (ref >60)
Glucose, Bld: 114 mg/dL — ABNORMAL HIGH (ref 70–99)
Potassium: 4.1 mmol/L (ref 3.5–5.1)
Sodium: 127 mmol/L — ABNORMAL LOW (ref 135–145)

## 2023-03-03 LAB — GLUCOSE, CAPILLARY
Glucose-Capillary: 147 mg/dL — ABNORMAL HIGH (ref 70–99)
Glucose-Capillary: 134 mg/dL — ABNORMAL HIGH (ref 70–99)
Glucose-Capillary: 144 mg/dL — ABNORMAL HIGH (ref 70–99)
Glucose-Capillary: 109 mg/dL — ABNORMAL HIGH (ref 70–99)
Glucose-Capillary: 90 mg/dL (ref 70–99)
Glucose-Capillary: 126 mg/dL — ABNORMAL HIGH (ref 70–99)

## 2023-03-03 LAB — MAGNESIUM
Magnesium: 2.1 mg/dL (ref 1.7–2.4)
Magnesium: 1.8 mg/dL (ref 1.7–2.4)

## 2023-03-03 LAB — BPAM PLATELET PHERESIS
Blood Product Expiration Date: 202412232359
ISSUE DATE / TIME: 202412200858
Unit Type and Rh: 8400

## 2023-03-03 LAB — PROTIME-INR
INR: 1.6 — ABNORMAL HIGH (ref 0.8–1.2)
Prothrombin Time: 19.4 s — ABNORMAL HIGH (ref 11.4–15.2)

## 2023-03-03 LAB — LACTIC ACID, PLASMA: Lactic Acid, Venous: 2.3 mmol/L (ref 0.5–1.9)

## 2023-03-03 LAB — BPAM CRYOPRECIPITATE
Blood Product Expiration Date: 202412242359
ISSUE DATE / TIME: 202412200119
Unit Type and Rh: 5100

## 2023-03-03 LAB — PHOSPHORUS: Phosphorus: 2 mg/dL — ABNORMAL LOW (ref 2.5–4.6)

## 2023-03-03 LAB — PREPARE RBC (CROSSMATCH)

## 2023-03-03 MED ORDER — INSULIN ASPART 100 UNIT/ML IJ SOLN
0.0000 [IU] | Freq: Four times a day (QID) | INTRAMUSCULAR | Status: DC
  Administered 2023-03-03 – 2023-03-06 (×4): 2 [IU] via SUBCUTANEOUS
  Administered 2023-03-07: 3 [IU] via SUBCUTANEOUS
  Administered 2023-03-07: 2 [IU] via SUBCUTANEOUS
  Administered 2023-03-08 (×2): 3 [IU] via SUBCUTANEOUS
  Administered 2023-03-08: 2 [IU] via SUBCUTANEOUS

## 2023-03-03 MED ORDER — DEXMEDETOMIDINE HCL IN NACL 400 MCG/100ML IV SOLN
0.0000 ug/kg/h | INTRAVENOUS | Status: DC
  Administered 2023-03-03: 0.5 ug/kg/h via INTRAVENOUS
  Administered 2023-03-03 – 2023-03-04 (×2): 1.2 ug/kg/h via INTRAVENOUS
  Administered 2023-03-04: 1.1 ug/kg/h via INTRAVENOUS
  Filled 2023-03-03: qty 100
  Filled 2023-03-03: qty 200
  Filled 2023-03-03: qty 100

## 2023-03-03 MED ORDER — DIPHENHYDRAMINE HCL 12.5 MG/5ML PO ELIX
25.0000 mg | ORAL_SOLUTION | Freq: Every evening | ORAL | Status: DC | PRN
  Administered 2023-03-19: 25 mg via ORAL
  Filled 2023-03-03 (×3): qty 10

## 2023-03-03 MED ORDER — FUROSEMIDE 10 MG/ML IJ SOLN
20.0000 mg | Freq: Once | INTRAMUSCULAR | Status: AC
  Administered 2023-03-03: 20 mg via INTRAVENOUS
  Filled 2023-03-03: qty 2

## 2023-03-03 MED ORDER — QUETIAPINE FUMARATE 25 MG PO TABS
25.0000 mg | ORAL_TABLET | Freq: Every day | ORAL | Status: DC
  Administered 2023-03-03: 25 mg via ORAL
  Filled 2023-03-03: qty 1

## 2023-03-03 MED ORDER — SODIUM CHLORIDE 0.9% IV SOLUTION
Freq: Once | INTRAVENOUS | Status: AC
Start: 2023-03-03 — End: 2023-03-03

## 2023-03-03 MED ORDER — OXYCODONE HCL 5 MG/5ML PO SOLN
5.0000 mg | ORAL | Status: DC | PRN
  Administered 2023-03-03: 5 mg
  Filled 2023-03-03: qty 5

## 2023-03-03 NOTE — Progress Notes (Signed)
2 Days Post-Op Procedure(s) (LRB): EXPLORATION POST OPERATIVE OPEN HEART (N/A) Subjective: No complaints, denies pain and nausea  Objective: Vital signs in last 24 hours: Temp:  [98.6 F (37 C)-100.2 F (37.9 C)] 99.3 F (37.4 C) (12/21 0800) Pulse Rate:  [55-120] 84 (12/21 0800) Cardiac Rhythm: Normal sinus rhythm (12/20 2000) Resp:  [8-26] 18 (12/21 0800) SpO2:  [90 %-100 %] 97 % (12/21 0800) Arterial Line BP: (70-121)/(52-94) 110/52 (12/21 0800) FiO2 (%):  [40 %-50 %] 40 % (12/20 1416) Weight:  [67.3 kg] 67.3 kg (12/21 0500)  Hemodynamic parameters for last 24 hours: PAP: (23-48)/(6-23) 44/18 CVP:  [3 mmHg-45 mmHg] 10 mmHg CO:  [3.6 L/min-4.8 L/min] 4 L/min CI:  [2.19 L/min/m2-2.89 L/min/m2] 2.5 L/min/m2  Intake/Output from previous day: 12/20 0701 - 12/21 0700 In: 3372.4 [I.V.:2107.7; Blood:409.2; NG/GT:263.3; IV Piggyback:400.1] Out: 2300 [Urine:1235; Emesis/NG output:55; Chest Tube:1010] Intake/Output this shift: No intake/output data recorded.  General appearance: alert, cooperative, and no distress Neurologic: intact Heart: regular rate and rhythm Lungs: diminished breath sounds bibasilar Abdomen: normal findings: soft, non-tender Extremities: warm  Lab Results: Recent Labs    03/02/23 1558 03/03/23 0314  WBC 9.4 10.0  HGB 8.4* 7.6*  HCT 24.1* 21.4*  PLT 105* 88*   BMET:  Recent Labs    03/02/23 1558 03/03/23 0314  NA 132* 128*  K 4.5 4.2  CL 102 101  CO2 22 23  GLUCOSE 139* 138*  BUN 11 13  CREATININE 0.57* 0.63  CALCIUM 7.6* 7.4*    PT/INR:  Recent Labs    03/03/23 0314  LABPROT 19.4*  INR 1.6*   ABG    Component Value Date/Time   PHART 7.420 03/02/2023 1552   HCO3 22.7 03/02/2023 1552   TCO2 24 03/02/2023 1552   ACIDBASEDEF 1.0 03/02/2023 1552   O2SAT 97 03/02/2023 1552   CBG (last 3)  Recent Labs    03/02/23 2303 03/03/23 0308 03/03/23 0758  GLUCAP 147* 134* 144*    Assessment/Plan: S/P Procedure(s)  (LRB): EXPLORATION POST OPERATIVE OPEN HEART (N/A) POD # 2  NEURO- alert and appropriate, no focal deficit CV- in SR  Impella p8 with 4.4L flow  Pulsatility 108/83  49/18 CI 2.45  On milrinone 0.375, epi 3, norepi 6, vasopressin 0.04  Dc pacing wires RESP- 4L Spencer 97% sat  Small to moderate right pleural effusion RENAL- creatinine normal  Hopefully can begin to diurese soon ENDO- CBG mildly elevated  SSI GI- needs Speech for swallow eval Malnutrition- severe, TF started advance as tolerated Anemia- Hgb 7.6- will transfuse 1 unit Thrombocytopenia- no bleeding at present, monitor  No enoxaparin  LOS: 17 days    [name] 03/03/2023

## 2023-03-03 NOTE — Plan of Care (Signed)
 Problem: Health Behavior/Discharge Planning: Goal: Ability to manage health-related needs will improve Outcome: Progressing   Problem: Clinical Measurements: Goal: Ability to maintain clinical measurements within normal limits will improve Outcome: Progressing Goal: Will remain free from infection Outcome: Progressing Goal: Diagnostic test results will improve Outcome: Progressing Goal: Respiratory complications will improve Outcome: Progressing Goal: Cardiovascular complication will be avoided Outcome: Progressing   Problem: Activity: Goal: Risk for activity intolerance will decrease Outcome: Progressing   Problem: Coping: Goal: Level of anxiety will decrease Outcome: Progressing   Problem: Elimination: Goal: Will not experience complications related to bowel motility Outcome: Progressing Goal: Will not experience complications related to urinary retention Outcome: Progressing   Problem: Pain Management: Goal: General experience of comfort will improve Outcome: Progressing   Problem: Safety: Goal: Ability to remain free from injury will improve Outcome: Progressing   Problem: Skin Integrity: Goal: Risk for impaired skin integrity will decrease Outcome: Progressing   Problem: Education: Goal: Knowledge of disease or condition will improve Outcome: Progressing Goal: Understanding of medication regimen will improve Outcome: Progressing Goal: Individualized Educational Video(s) Outcome: Progressing   Problem: Activity: Goal: Ability to tolerate increased activity will improve Outcome: Progressing   Problem: Cardiac: Goal: Ability to achieve and maintain adequate cardiopulmonary perfusion will improve Outcome: Progressing   Problem: Health Behavior/Discharge Planning: Goal: Ability to safely manage health-related needs after discharge will improve Outcome: Progressing   Problem: Education: Goal: Will demonstrate proper wound care and an understanding of  methods to prevent future damage Outcome: Progressing Goal: Knowledge of disease or condition will improve Outcome: Progressing Goal: Knowledge of the prescribed therapeutic regimen will improve Outcome: Progressing Goal: Individualized Educational Video(s) Outcome: Progressing   Problem: Activity: Goal: Risk for activity intolerance will decrease Outcome: Progressing   Problem: Cardiac: Goal: Will achieve and/or maintain hemodynamic stability Outcome: Progressing   Problem: Clinical Measurements: Goal: Postoperative complications will be avoided or minimized Outcome: Progressing   Problem: Respiratory: Goal: Respiratory status will improve Outcome: Progressing   Problem: Skin Integrity: Goal: Wound healing without signs and symptoms of infection Outcome: Progressing Goal: Risk for impaired skin integrity will decrease Outcome: Progressing   Problem: Urinary Elimination: Goal: Ability to achieve and maintain adequate renal perfusion and functioning will improve Outcome: Progressing   Problem: Education: Goal: Knowledge of General Education information will improve Description: Including pain rating scale, medication(s)/side effects and non-pharmacologic comfort measures Outcome: Progressing   Problem: Health Behavior/Discharge Planning: Goal: Ability to manage health-related needs will improve Outcome: Progressing   Problem: Clinical Measurements: Goal: Ability to maintain clinical measurements within normal limits will improve Outcome: Progressing Goal: Will remain free from infection Outcome: Progressing Goal: Diagnostic test results will improve Outcome: Progressing Goal: Respiratory complications will improve Outcome: Progressing Goal: Cardiovascular complication will be avoided Outcome: Progressing   Problem: Activity: Goal: Risk for activity intolerance will decrease Outcome: Progressing   Problem: Nutrition: Goal: Adequate nutrition will be  maintained Outcome: Progressing   Problem: Coping: Goal: Level of anxiety will decrease Outcome: Progressing   Problem: Elimination: Goal: Will not experience complications related to bowel motility Outcome: Progressing Goal: Will not experience complications related to urinary retention Outcome: Progressing   Problem: Pain Management: Goal: General experience of comfort will improve Outcome: Progressing   Problem: Safety: Goal: Ability to remain free from injury will improve Outcome: Progressing   Problem: Skin Integrity: Goal: Risk for impaired skin integrity will decrease Outcome: Progressing   Problem: Education: Goal: Knowledge of risk factors and measures for prevention of condition will improve Outcome:  Progressing

## 2023-03-03 NOTE — Progress Notes (Addendum)
Advanced Heart Failure Rounding Note  PCP-Cardiologist: None  Chief Complaint: Cardiogenic Shock  Subjective:    12/19: S/p CABG and Impella 5.5 Insertion. Coagulopathic on return from OR, high chest tube output, required multiple blood products. Subsequent VT associated w/ Impella suction alarms. Back to OR for re-exploratation, bleeding from EPW.   POD# 2. Intubated and sedated   1010 from chest tube yesterday, only today  Hgb 7.6, getting a unit of blood Plts 88K   Impella at P-8, Flow 4.5L. No further suction alarms.   Junctional rhythm this morning, increased pacer to 80bpm  Off Epi, NE 6, Milrinone 0.375, and Vaso 0.04. MAPs 80.   Swan#s:  CVP 8 PAP 35/14 (28)   Objective:   Weight Range: 67.3 kg Body mass index is 22.56 kg/m.   Vital Signs:   Temp:  [99 F (37.2 C)-100.2 F (37.9 C)] 99 F (37.2 C) (12/21 1345) Pulse Rate:  [55-120] 80 (12/21 1345) Resp:  [8-31] 25 (12/21 1345) BP: (106-109)/(78-81) 106/78 (12/21 1200) SpO2:  [87 %-100 %] 91 % (12/21 1345) Arterial Line BP: (70-121)/(52-93) 111/79 (12/21 1345) FiO2 (%):  [40 %] 40 % (12/20 1416) Weight:  [67.3 kg] 67.3 kg (12/21 0500) Last BM Date : 02/27/23  Weight change: Filed Weights   03/01/23 0637 03/02/23 0500 03/03/23 0500  Weight: 54.9 kg 67.1 kg 67.3 kg   Intake/Output:   Intake/Output Summary (Last 24 hours) at 03/03/2023 1359 Last data filed at 03/03/2023 1300 Gross per 24 hour  Intake 3336.02 ml  Output 2206 ml  Net 1130.02 ml    Physical Exam   General:  critically ill HEENT: NCAT Neck: supple.  + RIJ Swan. Rt subclavian 5.5 Impella Carotids 2+ bilat; no bruits. No lymphadenopathy or thyromegaly appreciated. Cor: Regular rate & rhythm. No rubs, gallops or murmurs. + sternal dressing, + EWP, + CTs  Lungs: Normal WOB Abdomen: soft, nontender, nondistended. No hepatosplenomegaly. No bruits or masses. Good bowel sounds. Extremities: Mild LE edema Neuro: slow to  respond, moves all 4's GU: + foley    Telemetry   NSR 80s, occasional junction rhythm in the 60s  Patient Profile   Walter Vargas is a 77 y.o. history of HTN, Walter, HFrEF, and severe 3V CAD.  S/p CABG and Impella 5.5 Insertion 12/19 w D.r Dorris Fetch.   Assessment/Plan   Cardiogenic Postoperative Shock - Biventricular failure at b/l. Pre CABG echo EF < 20%, RV moderately reduced (PAPi was adequate on RHC) - Intra-op course complicated by cardiac stun and failure to wean from bypass. Impella 5.5 placed  - intra op TEE EF 20%, RV mildly reduced  - Impella 5.5 @ P8. Flow 4.5LPM - NE 6, Milrinone 0.375, and Vaso 0.04 - continue current Impella and inotropic support support  - Give 20mg  IV lasix, goal urine output 2-3L, can increase to 40mg  if needed  2. CAD: Patient admitted with NSTEMI (?culprit D2) but with severe extensive diffuse 3 vessel disease.  Disease too extensive to treat percutaneously.  Anatomy felt best served by CABG. - s/p CABG and Impella 5.5 insertion w Dr. Dorris Fetch 12/19. - Vent management per CCM.  - Continue ASA 352  - Held statin w/ mildly elevated LFTs while being treated w/ Plaxlovid. Will need to restart prior to discharge. - no ? blocker w/ shock   3. Acute on chronic systolic CHF: Patient has had HF symptoms for at least a couple of months.  Suspect pre-existing ischemic cardiomyopathy then he presented this admission  with NSTEMI possible in D2 territory leading to CHF exacerbation. RHC 12/3 showed normal filling pressures and low CI at 2.0. Echo 12/4: EF 20%, RV mild-moderately dysfunctional, mild-moderate Walter.  - in CGS as above. - Hold GDMT with impella, can ambulate - Will need repeat Echo closer to discharge  - Follow renal function/ UOP   4. Post-Operative Bleeding/ ABLA -coagulopathic on return from OR - Supported with multiple units of blood products including RBCs, cryo, FFP and Factor VII + albumin and protamine, had to return to the OR - CT OP  slowing, today, 1unit pRBC - continue to monitor closely   5. 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  Patient has had on and off confusion in hospital.  - Ongoing  6. Gallbladder disease: Patient had gallstones but HIDA scan not suggestive of cholecystitis.  7. COVID+: Completed Paxlovid  8. Fall: Had inpatient fall. No injuries. Did not hit his head.   9. 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  10. Acute Hypoxic Respiratory Failure - Improving, diuresis as above      Length of Stay: 17  Romie Minus, MD  03/03/2023, 1:59 PM  Advanced Heart Failure Team Pager [phone] (M-F; 7a - 5p)  Please contact [facility] for night-coverage after hours (5p -7a ) and weekends on [email or web address]  CRITICAL CARE Performed by: Romie Minus   Total critical care time: 40 minutes  Critical care time was exclusive of separately billable procedures and treating other patients.  Critical care was necessary to treat or prevent imminent or life-threatening deterioration.  Critical care was time spent personally by me on the following activities: development of treatment plan with patient and/or surrogate as well as nursing, discussions with consultants, evaluation of patient's response to treatment, examination of patient, obtaining history from patient or surrogate, ordering and performing treatments and interventions, ordering and review of laboratory studies, ordering and review of radiographic studies, pulse oximetry and re-evaluation of patient's condition.

## 2023-03-03 NOTE — Progress Notes (Signed)
      [address]       [name] [address]             [phone]      POD # 2  Some cough, denies pain  Was trying to get OOB earlier  BP 106/78   Pulse 86   Temp 98.8 F (37.1 C)   Resp (!) 25   Ht 5\' 8"  (1.727 m)   Wt 67.3 kg   SpO2 94%   BMI 22.56 kg/m   Milrinone 0.3, norepi 5, vasopressin 0.04 44/17 CI 2.7 IMpella p8 4.3 L/min   Intake/Output Summary (Last 24 hours) at 03/03/2023 1659 Last data filed at 03/03/2023 1525 Gross per 24 hour  Intake 3389.51 ml  Output 2161 ml  Net 1228.51 ml    PM labs pending  Will give 20 mg of Lasix again this evening, will try to be more aggressive tomorrow  Viviann Spare [name], MD [facility] [phone]

## 2023-03-03 NOTE — Progress Notes (Signed)
SLP Cancellation Note  Patient Details Name: Walter Vargas MRN: [id number] DOB: Aug 30, 1945   Cancelled treatment:       Reason Eval/Treat Not Completed: D/W RN. Pt beset with confusion this afternoon, not able to participate in swallow evaluation. Cortrak in place for nutrition. Will f/u next date.  Walter Ware L. Samson Frederic, MA CCC/SLP Clinical Specialist - Acute Care SLP Acute Rehabilitation Services Office number [phone]    Walter Vargas 03/03/2023, 2:10 PM

## 2023-03-03 NOTE — Evaluation (Signed)
Physical Therapy Re-Evaluation Patient Details Name: Walter Vargas MRN: [id number] DOB: [birth year] Today's Date: 03/03/2023  History of Present Illness  77 y.o. male admitted 02/13/22 with NSTEMI, CHF exacerbation. LHC with 3-vessel CAD. CABG surgery delayed due to being covid +. S/p CABG x5, TEE, and placement of impella and swan-ganz 12/19. Coagulopathic on return from OR, high chest tube output, required multiple blood products. Back to OR for re-exploratation, bleeding from EPW. ETT 12/19 - 12/20. Chest tubes 12/19 - 12/21. Cortrak placed 12/20.  Pt with fall in room on 12/9. PMH includes HTN, HF, mod/severe MR.   Clinical Impression  Performed PT Re-Evaluation now that pt is s/p CABG. Limited session to transfers only for pt safety due to pt having a Swan-Ganz catheter line. Currently, pt is demonstrating deficits in cognition, balance (posterior bias in standing), power, activity tolerance, and gross strength. He needed intermittent knee blocking in standing due to buckling/instability. At this time, pt is requiring mod-maxA to roll, modA to transition sidelying to sit EOB, maxAx2 to transition sit to supine, and modA to transfer to stand with HHA. +2 provided from RN to monitor and manage lines throughout the session. The pt has had a drastic functional decline and could greatly benefit from intensive inpatient rehab, > 3 hours/day. Will continue to follow acutely.        If plan is discharge home, recommend the following: Assistance with cooking/housework;Direct supervision/assist for medications management;Direct supervision/assist for financial management;Assist for transportation;Supervision due to cognitive status;A lot of help with walking and/or transfers;A lot of help with bathing/dressing/bathroom;Help with stairs or ramp for entrance   Can travel by private vehicle        Equipment Recommendations Other (comment) (TBD)  Recommendations for Other Services  Rehab consult;OT  consult    Functional Status Assessment Patient has had a recent decline in their functional status and demonstrates the ability to make significant improvements in function in a reasonable and predictable amount of time.     Precautions / Restrictions Precautions Precautions: Fall;Sternal Precaution Booklet Issued: No Precaution Comments: reviewed precautions; Impella; Swan-Ganz; A-line; temp pacer Restrictions Weight Bearing Restrictions Per Provider Order: Yes Other Position/Activity Restrictions: sternal precautions      Mobility  Bed Mobility Overal bed mobility: Needs Assistance Bed Mobility: Rolling, Sidelying to Sit, Sit to Sidelying Rolling: Mod assist, Max assist Sidelying to sit: +2 for safety/equipment, HOB elevated, Mod assist     Sit to sidelying: Max assist, +2 for physical assistance, +2 for safety/equipment General bed mobility comments: Cues provided for pt to flex leg and bring UE to his chest when rolling either direction, mod-maxA needed to rotate trunk. Cues provided to bring legs off EOB and ascend trunk, modA at legs and trunk to sit up, + 2 for lines. MaxAx2 to manage trunk, legs, and lines back to supine.    Transfers Overall transfer level: Needs assistance Equipment used: 1 person hand held assist Transfers: Sit to/from Stand Sit to Stand: Mod assist, +2 safety/equipment           General transfer comment: Cues provided for pt to place hands on his lap with transfers. ModA needed to power up to stand, shift weight anteriorly, and gain balance, cuing pt to lean anteriorly and extend trunk and legs. Knees blocked due to intermittent buckling noted. +2 for lines    Ambulation/Gait               General Gait Details: deferred due to Swan-Ganz line  Stairs  Wheelchair Mobility     Tilt Bed    Modified Rankin (Stroke Patients Only)       Balance Overall balance assessment: Needs assistance Sitting-balance support: No  upper extremity supported, Feet supported Sitting balance-Leahy Scale: Fair Sitting balance - Comments: Static sitting EOB with CGA for safety   Standing balance support: Single extremity supported, Bilateral upper extremity supported Standing balance-Leahy Scale: Poor Standing balance comment: Pt placed hands on therapist anterior to him for support. Noted a posterior lean and tendency to flex at his trunk and legs, needing verbal and tactile cues to correct these deviations with mod success. ModA for static standing balance                             Pertinent Vitals/Pain Pain Assessment Pain Assessment: Faces Faces Pain Scale: Hurts little more Pain Location: generalized Pain Descriptors / Indicators: Discomfort, Grimacing, Operative site guarding Pain Intervention(s): Limited activity within patient's tolerance, Monitored during session, Repositioned    Home Living Family/patient expects to be discharged to:: Private residence Living Arrangements: Alone Available Help at Discharge: Family;Available 24 hours/day Type of Home: Apartment Home Access: Stairs to enter Entrance Stairs-Rails: Right Entrance Stairs-Number of Steps: 3   Home Layout: One level Home Equipment: None Additional Comments: lives alone; sister and brother-in-law (in good shape) live in [place], state they're retired and plan to stay with pt for initial 24/7 assist at d/c    Prior Function Prior Level of Function : Independent/Modified Independent;Driving             Mobility Comments: independent without DME. drives. enjoys going to [employer] to socialize in the morning       Extremity/Trunk Assessment   Upper Extremity Assessment Upper Extremity Assessment: Defer to OT evaluation    Lower Extremity Assessment Lower Extremity Assessment: Generalized weakness    Cervical / Trunk Assessment Cervical / Trunk Assessment: Kyphotic  Communication   Communication Communication: No  apparent difficulties  Cognition Arousal: Alert Behavior During Therapy: Flat affect Overall Cognitive Status: Impaired/Different from baseline Area of Impairment: Attention, Memory, Safety/judgement, Following commands, Awareness, Problem solving                   Current Attention Level: Sustained Memory: Decreased short-term memory, Decreased recall of precautions Following Commands: Follows one step commands consistently, Follows one step commands with increased time, Follows multi-step commands inconsistently Safety/Judgement: Decreased awareness of safety, Decreased awareness of deficits Awareness: Intellectual Problem Solving: Slow processing, Decreased initiation, Difficulty sequencing, Requires verbal cues, Requires tactile cues General Comments: Pt with no recall of precautions mentioned earlier in session. Slow to process cues. Needs step-by-step cues to sequence mobility.        General Comments General comments (skin integrity, edema, etc.): VSS; RN present throughout to monitor and manage lines for therapist; noted increased drainage from where chest tubes were with mobility (some already present at start of session), RN aware and changed bandage end of session    Exercises     Assessment/Plan    PT Assessment Patient needs continued PT services  PT Problem List Decreased strength;Decreased activity tolerance;Decreased balance;Decreased mobility;Decreased cognition;Decreased knowledge of use of DME;Decreased knowledge of precautions;Cardiopulmonary status limiting activity;Decreased safety awareness       PT Treatment Interventions DME instruction;Gait training;Stair training;Functional mobility training;Therapeutic activities;Therapeutic exercise;Balance training;Patient/family education;Cognitive remediation;Neuromuscular re-education    PT Goals (Current goals can be found in the Care Plan section)  Acute Rehab PT Goals  Patient Stated Goal: to improve PT  Goal Formulation: With patient Time For Goal Achievement: 03/17/23 Potential to Achieve Goals: Good    Frequency Min 1X/week     Co-evaluation               AM-PAC PT "6 Clicks" Mobility  Outcome Measure Help needed turning from your back to your side while in a flat bed without using bedrails?: A Lot Help needed moving from lying on your back to sitting on the side of a flat bed without using bedrails?: A Lot Help needed moving to and from a bed to a chair (including a wheelchair)?: A Lot Help needed standing up from a chair using your arms (e.g., wheelchair or bedside chair)?: A Lot Help needed to walk in hospital room?: Total Help needed climbing 3-5 steps with a railing? : Total 6 Click Score: 10    End of Session Equipment Utilized During Treatment: Oxygen Activity Tolerance: Patient tolerated treatment well;Other (comment) (limited by Swan-Ganz line) Patient left: in bed;with call bell/phone within reach;with bed alarm set Nurse Communication: Mobility status;Other (comment) (increased drainage from old chest tube site, RN aware and changed bandage) PT Visit Diagnosis: Other abnormalities of gait and mobility (R26.89);Unsteadiness on feet (R26.81);Muscle weakness (generalized) (M62.81);Difficulty in walking, not elsewhere classified (R26.2)    Time: 1478-2956 PT Time Calculation (min) (ACUTE ONLY): 29 min   Charges:   PT Evaluation $PT Re-evaluation: 1 Re-eval PT Treatments $Therapeutic Activity: 8-22 mins PT General Charges $$ ACUTE PT VISIT: 1 Visit         Virgil Benedict, PT, DPT Acute Rehabilitation Services  Office: [phone]   Bettina Gavia 03/03/2023, 5:15 PM

## 2023-03-03 NOTE — Progress Notes (Signed)
NAME:  RADLEY TWING, MRN:  [id number], DOB:  1945-12-13, LOS: 17 ADMISSION DATE:  02/14/2023, CONSULTATION DATE:  03/02/2023 REFERRING MD: Dorris Fetch - TCTS, CHIEF COMPLAINT: Postoperative management  History of Present Illness:  Encephalopathic, therefore HPI obtained from EMR review.  89 yoM with PMH significant for HTN, MR, HFrEF, and CAD admitted to [facility] 11/30 with acute on chronic HFrEF and NSTEMI.  Found to be in acute Biventricular HF with moderate to severe MR.  Treated with IV heparin, diuresed and transferred to [facility] for CABG evaluation on 12/4, admitted to HF team. Additionally tested positive for COVID 12/7, s/p completion of paxlovid with hospitalization complicated by intermittent delirium.  Underwent CABG x 5 on 12/19 by Dr. Dorris Fetch, complicated by cardiac stunning and failure to wean from bypass s/p with placement of Impella 5.5.  On return to ICU, pt coagulopathic with high CT output, VT event w/suction alarm, given multiple blood products, requiring return to OR for re-exploration without obvious source found, suspected due to coagulopathy.  PCCM consulted to assist with vent and medical management postop.   Pertinent Medical History:  HTN, MR, HFrEF, CAD  Significant Hospital Events: Including procedures, antibiotic start and stop dates in addition to other pertinent events   12/20 - PCCM consulted.  Interim History / Subjective:  No significant events overnight No complaints of pain, discomfort A&O x 2-3, answers orientation questions correctly with some redirection Weaning down on pressor support, Epi 1 and Levo 6 Mildly bradycardic in 50s  Objective:  Blood pressure (!) 103/90, pulse 84, temperature 99.3 F (37.4 C), resp. rate (!) 23, height 5\' 8"  (1.727 m), weight 67.3 kg, SpO2 96%. PAP: (23-48)/(6-23) 46/19 CVP:  [3 mmHg-45 mmHg] 11 mmHg CO:  [3.6 L/min-4.8 L/min] 4.8 L/min CI:  [2.19 L/min/m2-2.89 L/min/m2] 2.89 L/min/m2  Vent Mode: PSV;CPAP FiO2 (%):   [40 %-50 %] 40 % Set Rate:  [4 bmp-20 bmp] 4 bmp Vt Set:  [540 mL] 540 mL PEEP:  [5 cmH20-8 cmH20] 5 cmH20 Pressure Support:  [10 cmH20] 10 cmH20 Plateau Pressure:  [20 cmH20] 20 cmH20   Intake/Output Summary (Last 24 hours) at 03/03/2023 0757 Last data filed at 03/03/2023 0700 Gross per 24 hour  Intake 3372.4 ml  Output 2300 ml  Net 1072.4 ml   Filed Weights   03/01/23 0637 03/02/23 0500 03/03/23 0500  Weight: 54.9 kg 67.1 kg 67.3 kg   Physical Examination: General: Acutely ill-appearing older man in NAD. HEENT: [place]/AT, anicteric sclera, PERRL, slightly dry mucous membranes. Neuro: Awake, oriented x 2-3. Responds to verbal stimuli. Following commands consistently. Moves all 4 extremities spontaneously. Strength 4/5 in 4 extremities.  CV: Bradycardic, no m/g/r. Midline sternotomy c/d/I with dressing in place. CT present. PULM: Breathing even and unlabored on 4LNC. Lung fields diminished at bilateral bases. GI: Soft, nontender, nondistended. Normoactive bowel sounds. Extremities: No significant LE edema noted. Skin: Warm/dry, no rashes.  Resolved Hospital Problem list    Assessment & Plan:   Multivessel CAD s/p CABG x5 NSTEMI Post bypass vasoplegia/ cardiogenic shock s/p impella 5.5 Acute on chronic biventricular HF Acute postoperative hypoxic respiratory insufficiency Expected post-operative ABLA with coagulopathy  Intraoperative TEE EF 20%, RV mildly reduced. S/p 4U PRBC, 4U FFP, 2U Cryo, 2 Plt. - POD#2 CABG x 5  - Postoperative management per TCTS - Chest tube/pacing wire management per TCTS - Impella 5.5 in place - Trend Co-ox - Goal MAP > 65 - Levo/Epi, weaning as able - Milrinone, amio per HF/TCTS - ASA/statin  Expected  post-operative ABLA with coagulopathy  - Trend H&H - Monitor for signs of active bleeding - Transfuse for Hgb < 7.0 or hemodynamically significant bleeding (per TCTS)  Acute postoperative hypoxic respiratory insufficiency - Supplemental O2  support - Wean O2 for sat > 90% - Pulmonary hygiene - PAD protocol for sedation: Precedex for goal RASS 0 to -1  COVID+ - S/p Paxlovid treatment (end 12/12)  Delirium 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 - Delirium precautions/supportive care  Best Practice (right click and "Reselect all SmartList Selections" daily)   Diet/type: NPO DVT prophylaxis SCD Pressure ulcer(s): N/A GI prophylaxis: PPI Lines: Central line, Arterial Line, and yes and it is still needed Foley:  Yes, and it is still needed Code Status:  full code Last date of multidisciplinary goals of care discussion [per primary team]  Critical care time:    The patient is critically ill with multiple organ system failure and requires high complexity decision making for assessment and support, frequent evaluation and titration of therapies, advanced monitoring, review of radiographic studies and interpretation of complex data.   Critical Care Time devoted to patient care services, exclusive of separately billable procedures, described in this note is 36 minutes.  Tim Lair, PA-C [facility] 03/03/23 7:58 AM  Please see [email or web address] for pager details.  From 7A-7P if no response, please call [phone] After hours, please call ELink [phone]

## 2023-03-04 ENCOUNTER — Inpatient Hospital Stay (HOSPITAL_COMMUNITY): Payer: 59

## 2023-03-04 LAB — BPAM RBC
Blood Product Expiration Date: 202501012359
Blood Product Expiration Date: 202501012359
Blood Product Expiration Date: 202501112359
Blood Product Expiration Date: 202501112359
Blood Product Expiration Date: 202501122359
Blood Product Expiration Date: 202501122359
Blood Product Expiration Date: 202501122359
Blood Product Expiration Date: 202501122359
Blood Product Expiration Date: 202501122359
Blood Product Expiration Date: 202501122359
Blood Product Expiration Date: 202501132359
Blood Product Expiration Date: 202501132359
Blood Product Expiration Date: 202501132359
Blood Product Expiration Date: 202501152359
Blood Product Expiration Date: 202501152359
Blood Product Expiration Date: 202501162359
Blood Product Expiration Date: 202501162359
ISSUE DATE / TIME: 202412190855
ISSUE DATE / TIME: 202412190855
ISSUE DATE / TIME: 202412191309
ISSUE DATE / TIME: 202412191618
ISSUE DATE / TIME: 202412191836
ISSUE DATE / TIME: 202412191850
ISSUE DATE / TIME: 202412191930
ISSUE DATE / TIME: 202412191930
ISSUE DATE / TIME: 202412192239
ISSUE DATE / TIME: 202412192239
ISSUE DATE / TIME: 202412192319
ISSUE DATE / TIME: 202412192319
ISSUE DATE / TIME: 202412192331
ISSUE DATE / TIME: 202412201638
ISSUE DATE / TIME: 202412201827
Unit Type and Rh: 5100
Unit Type and Rh: 5100
Unit Type and Rh: 5100
Unit Type and Rh: 5100
Unit Type and Rh: 5100
Unit Type and Rh: 5100
Unit Type and Rh: 5100
Unit Type and Rh: 5100
Unit Type and Rh: 5100
Unit Type and Rh: 5100
Unit Type and Rh: 5100
Unit Type and Rh: 5100
Unit Type and Rh: 5100
Unit Type and Rh: 5100
Unit Type and Rh: 5100
Unit Type and Rh: 5100
Unit Type and Rh: 5100

## 2023-03-04 LAB — TYPE AND SCREEN
ABO/RH(D): O POS
Antibody Screen: NEGATIVE
Unit division: 0
Unit division: 0
Unit division: 0
Unit division: 0
Unit division: 0
Unit division: 0
Unit division: 0
Unit division: 0
Unit division: 0
Unit division: 0
Unit division: 0
Unit division: 0
Unit division: 0
Unit division: 0
Unit division: 0
Unit division: 0
Unit division: 0

## 2023-03-04 LAB — CBC
HCT: 21.9 % — ABNORMAL LOW (ref 39.0–52.0)
HCT: 25.9 % — ABNORMAL LOW (ref 39.0–52.0)
Hemoglobin: 7.6 g/dL — ABNORMAL LOW (ref 13.0–17.0)
Hemoglobin: 8.9 g/dL — ABNORMAL LOW (ref 13.0–17.0)
MCH: 30 pg (ref 26.0–34.0)
MCH: 29.7 pg (ref 26.0–34.0)
MCHC: 34.7 g/dL (ref 30.0–36.0)
MCHC: 34.4 g/dL (ref 30.0–36.0)
MCV: 86.6 fL (ref 80.0–100.0)
MCV: 86.3 fL (ref 80.0–100.0)
Platelets: 66 10*3/uL — ABNORMAL LOW (ref 150–400)
Platelets: 63 10*3/uL — ABNORMAL LOW (ref 150–400)
RBC: 2.53 MIL/uL — ABNORMAL LOW (ref 4.22–5.81)
RBC: 3 MIL/uL — ABNORMAL LOW (ref 4.22–5.81)
RDW: 16.1 % — ABNORMAL HIGH (ref 11.5–15.5)
RDW: 15.9 % — ABNORMAL HIGH (ref 11.5–15.5)
WBC: 8.8 10*3/uL (ref 4.0–10.5)
WBC: 9.3 10*3/uL (ref 4.0–10.5)
nRBC: 0 % (ref 0.0–0.2)
nRBC: 0 % (ref 0.0–0.2)

## 2023-03-04 LAB — BASIC METABOLIC PANEL
Anion gap: 8 (ref 5–15)
Anion gap: 9 (ref 5–15)
BUN: 16 mg/dL (ref 8–23)
BUN: 19 mg/dL (ref 8–23)
CO2: 24 mmol/L (ref 22–32)
CO2: 22 mmol/L (ref 22–32)
Calcium: 7.7 mg/dL — ABNORMAL LOW (ref 8.9–10.3)
Calcium: 7.5 mg/dL — ABNORMAL LOW (ref 8.9–10.3)
Chloride: 95 mmol/L — ABNORMAL LOW (ref 98–111)
Chloride: 93 mmol/L — ABNORMAL LOW (ref 98–111)
Creatinine, Ser: 0.78 mg/dL (ref 0.61–1.24)
Creatinine, Ser: 0.61 mg/dL (ref 0.61–1.24)
GFR, Estimated: >60 mL/min (ref >60)
GFR, Estimated: >60 mL/min (ref >60)
Glucose, Bld: 120 mg/dL — ABNORMAL HIGH (ref 70–99)
Glucose, Bld: 132 mg/dL — ABNORMAL HIGH (ref 70–99)
Potassium: 3.8 mmol/L (ref 3.5–5.1)
Potassium: 3.8 mmol/L (ref 3.5–5.1)
Sodium: 127 mmol/L — ABNORMAL LOW (ref 135–145)
Sodium: 124 mmol/L — ABNORMAL LOW (ref 135–145)

## 2023-03-04 LAB — GLUCOSE, CAPILLARY
Glucose-Capillary: 70 mg/dL (ref 70–99)
Glucose-Capillary: 118 mg/dL — ABNORMAL HIGH (ref 70–99)
Glucose-Capillary: 121 mg/dL — ABNORMAL HIGH (ref 70–99)
Glucose-Capillary: 97 mg/dL (ref 70–99)
Glucose-Capillary: 100 mg/dL — ABNORMAL HIGH (ref 70–99)
Glucose-Capillary: 118 mg/dL — ABNORMAL HIGH (ref 70–99)
Glucose-Capillary: 90 mg/dL (ref 70–99)

## 2023-03-04 LAB — MAGNESIUM: Magnesium: 1.6 mg/dL — ABNORMAL LOW (ref 1.7–2.4)

## 2023-03-04 LAB — PREPARE RBC (CROSSMATCH)

## 2023-03-04 MED ORDER — ORAL CARE MOUTH RINSE
15.0000 mL | OROMUCOSAL | Status: DC | PRN
Start: 2023-03-04 — End: 2023-03-04

## 2023-03-04 MED ORDER — MAGNESIUM SULFATE 2 GM/50ML IV SOLN
2.0000 g | Freq: Once | INTRAVENOUS | Status: AC
  Administered 2023-03-04: 2 g via INTRAVENOUS
  Filled 2023-03-04: qty 50

## 2023-03-04 MED ORDER — ORAL CARE MOUTH RINSE: 15.0000 mL | OROMUCOSAL | Status: DC

## 2023-03-04 MED ORDER — FUROSEMIDE 10 MG/ML IJ SOLN
40.0000 mg | Freq: Once | INTRAMUSCULAR | Status: AC
  Administered 2023-03-04: 40 mg via INTRAVENOUS
  Filled 2023-03-04: qty 4

## 2023-03-04 MED ORDER — SODIUM CHLORIDE 0.9% IV SOLUTION: Freq: Once | INTRAVENOUS | Status: AC

## 2023-03-04 MED ORDER — POTASSIUM CHLORIDE 20 MEQ PO PACK
40.0000 meq | PACK | ORAL | Status: AC
Start: 2023-03-04 — End: 2023-03-04
  Administered 2023-03-04 (×2): 40 meq
  Filled 2023-03-04 (×2): qty 2

## 2023-03-04 MED ORDER — QUETIAPINE FUMARATE 25 MG PO TABS
25.0000 mg | ORAL_TABLET | Freq: Two times a day (BID) | ORAL | Status: DC
  Administered 2023-03-04 (×2): 25 mg via ORAL
  Filled 2023-03-04 (×2): qty 1

## 2023-03-04 MED ORDER — POTASSIUM CHLORIDE 20 MEQ PO PACK
40.0000 meq | PACK | Freq: Once | ORAL | Status: AC
  Administered 2023-03-04: 40 meq
  Filled 2023-03-04: qty 2

## 2023-03-04 NOTE — Plan of Care (Signed)
 Problem: Health Behavior/Discharge Planning: Goal: Ability to manage health-related needs will improve Outcome: Progressing   Problem: Clinical Measurements: Goal: Ability to maintain clinical measurements within normal limits will improve Outcome: Progressing Goal: Will remain free from infection Outcome: Progressing Goal: Diagnostic test results will improve Outcome: Progressing Goal: Respiratory complications will improve Outcome: Progressing Goal: Cardiovascular complication will be avoided Outcome: Progressing   Problem: Activity: Goal: Risk for activity intolerance will decrease Outcome: Progressing   Problem: Coping: Goal: Level of anxiety will decrease Outcome: Progressing   Problem: Elimination: Goal: Will not experience complications related to bowel motility Outcome: Progressing Goal: Will not experience complications related to urinary retention Outcome: Progressing   Problem: Pain Management: Goal: General experience of comfort will improve Outcome: Progressing   Problem: Safety: Goal: Ability to remain free from injury will improve Outcome: Progressing   Problem: Skin Integrity: Goal: Risk for impaired skin integrity will decrease Outcome: Progressing   Problem: Education: Goal: Knowledge of disease or condition will improve Outcome: Progressing Goal: Understanding of medication regimen will improve Outcome: Progressing Goal: Individualized Educational Video(s) Outcome: Progressing   Problem: Activity: Goal: Ability to tolerate increased activity will improve Outcome: Progressing   Problem: Cardiac: Goal: Ability to achieve and maintain adequate cardiopulmonary perfusion will improve Outcome: Progressing   Problem: Health Behavior/Discharge Planning: Goal: Ability to safely manage health-related needs after discharge will improve Outcome: Progressing   Problem: Education: Goal: Will demonstrate proper wound care and an understanding of  methods to prevent future damage Outcome: Progressing Goal: Knowledge of disease or condition will improve Outcome: Progressing Goal: Knowledge of the prescribed therapeutic regimen will improve Outcome: Progressing Goal: Individualized Educational Video(s) Outcome: Progressing   Problem: Activity: Goal: Risk for activity intolerance will decrease Outcome: Progressing   Problem: Cardiac: Goal: Will achieve and/or maintain hemodynamic stability Outcome: Progressing   Problem: Clinical Measurements: Goal: Postoperative complications will be avoided or minimized Outcome: Progressing   Problem: Respiratory: Goal: Respiratory status will improve Outcome: Progressing   Problem: Skin Integrity: Goal: Wound healing without signs and symptoms of infection Outcome: Progressing Goal: Risk for impaired skin integrity will decrease Outcome: Progressing   Problem: Urinary Elimination: Goal: Ability to achieve and maintain adequate renal perfusion and functioning will improve Outcome: Progressing   Problem: Education: Goal: Knowledge of General Education information will improve Description: Including pain rating scale, medication(s)/side effects and non-pharmacologic comfort measures Outcome: Progressing   Problem: Health Behavior/Discharge Planning: Goal: Ability to manage health-related needs will improve Outcome: Progressing   Problem: Clinical Measurements: Goal: Ability to maintain clinical measurements within normal limits will improve Outcome: Progressing Goal: Will remain free from infection Outcome: Progressing Goal: Diagnostic test results will improve Outcome: Progressing Goal: Respiratory complications will improve Outcome: Progressing Goal: Cardiovascular complication will be avoided Outcome: Progressing   Problem: Activity: Goal: Risk for activity intolerance will decrease Outcome: Progressing   Problem: Nutrition: Goal: Adequate nutrition will be  maintained Outcome: Progressing   Problem: Coping: Goal: Level of anxiety will decrease Outcome: Progressing   Problem: Elimination: Goal: Will not experience complications related to bowel motility Outcome: Progressing Goal: Will not experience complications related to urinary retention Outcome: Progressing   Problem: Pain Management: Goal: General experience of comfort will improve Outcome: Progressing   Problem: Safety: Goal: Ability to remain free from injury will improve Outcome: Progressing   Problem: Skin Integrity: Goal: Risk for impaired skin integrity will decrease Outcome: Progressing   Problem: Education: Goal: Knowledge of risk factors and measures for prevention of condition will improve Outcome:  Progressing

## 2023-03-04 NOTE — Progress Notes (Signed)
Advanced Heart Failure Rounding Note  PCP-Cardiologist: None  Chief Complaint: Cardiogenic Shock  Subjective:    12/19: S/p CABG and Impella 5.5 Insertion. Coagulopathic on return from OR, high chest tube output, required multiple blood products. Subsequent VT associated w/ Impella suction alarms. Back to OR for re-exploratation, bleeding from EPW.   POD# 3  Chest tubes removed yesterday  Hgb 7.6, getting a unit of blood Plts 66K   Impella at P-8, Flow 4.5L. No further suction alarms.   Sinus rhythm in the 70s this AM. Epi weaned to off, coming down on vaso now.   Off Epi, NE 4, Milrinone 0.3, and Vaso 0.03. MAPs 80.   Can remove swan.   Objective:   Weight Range: 68.3 kg Body mass index is 22.89 kg/m.   Vital Signs:   Temp:  [97.2 F (36.2 C)-99.3 F (37.4 C)] 97.9 F (36.6 C) (12/22 1021) Pulse Rate:  [70-113] 74 (12/22 1021) Resp:  [13-31] 23 (12/22 1021) BP: (106)/(78) 106/78 (12/21 1200) SpO2:  [87 %-100 %] 96 % (12/22 1021) Arterial Line BP: (86-136)/(60-106) 96/84 (12/22 1021) Weight:  [68.3 kg] 68.3 kg (12/22 0500) Last BM Date : 03/04/23  Weight change: Filed Weights   03/02/23 0500 03/03/23 0500 03/04/23 0500  Weight: 67.1 kg 67.3 kg 68.3 kg   Intake/Output:   Intake/Output Summary (Last 24 hours) at 03/04/2023 1159 Last data filed at 03/04/2023 1021 Gross per 24 hour  Intake 3053.14 ml  Output 1800 ml  Net 1253.14 ml    Physical Exam   General:  critically ill HEENT: NCAT Neck: supple.  + RIJ Swan. Rt subclavian 5.5 Impella Carotids 2+ bilat; no bruits. No lymphadenopathy or thyromegaly appreciated. Cor: Regular rate & rhythm. No rubs, gallops or murmurs. + sternal dressing, + EWP, + CTs  Lungs: Normal WOB Abdomen: soft, nontender, nondistended. No hepatosplenomegaly. No bruits or masses. Good bowel sounds. Extremities: Mild LE edema Neuro: slow to respond, moves all 4's GU: + foley    Telemetry   NSR 70s  Patient Profile    Walter Vargas is a 77 y.o. history of HTN, Walter, HFrEF, and severe 3V CAD.  S/p CABG and Impella 5.5 Insertion 12/19 w D.r Dorris Fetch.   Assessment/Plan   Cardiogenic Postoperative Shock - Biventricular failure at b/l. Pre CABG echo EF < 20%, RV moderately reduced (PAPi was adequate on RHC) - Intra-op course complicated by cardiac stun and failure to wean from bypass. Impella 5.5 placed  - intra op TEE EF 20%, RV mildly reduced  - Impella 5.5 @ P8. Flow 4.5LPM - NE 6, Milrinone 0.3, and Vaso 0.04 - continue current Impella and inotropic support support  - Redose lasix 40mg  IV x1 with unit of blood - Repeat later this afternoon  2. CAD: Patient admitted with NSTEMI (?culprit D2) but with severe extensive diffuse 3 vessel disease.  Disease too extensive to treat percutaneously.  Anatomy felt best served by CABG. - s/p CABG and Impella 5.5 insertion w Dr. Dorris Fetch 12/19. - Vent management per CCM.  - Continue ASA 352  - Held statin w/ mildly elevated LFTs while being treated w/ Plaxlovid. Will need to restart prior to discharge. - no ? blocker w/ shock   3. Acute on chronic systolic CHF: Patient has had HF symptoms for at least a couple of months.  Suspect pre-existing ischemic cardiomyopathy then he presented this admission with NSTEMI possible in D2 territory leading to CHF exacerbation. RHC 12/3 showed normal filling pressures and low  CI at 2.0. Echo 12/4: EF 20%, RV mild-moderately dysfunctional, mild-moderate Walter.  - in CGS as above. - Hold GDMT with impella, can ambulate - Will need repeat Echo closer to discharge  - Follow renal function/ UOP   4. Post-Operative Bleeding/ ABLA -coagulopathic on return from OR - Supported with multiple units of blood products including RBCs, cryo, FFP and Factor VII + albumin and protamine, had to return to the OR - CT OP slowing, today, 1unit pRBC - continue to monitor closely   5. 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  Patient has  had on and off confusion in hospital. On precedex this morning, slept well with seroquel overnight - Ongoing, appreciate CCM management  6. Gallbladder disease: Patient had gallstones but HIDA scan not suggestive of cholecystitis.  7. COVID+: Completed Paxlovid  8. Fall: Had inpatient fall. No injuries. Did not hit his head.   9. 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  10. Acute Hypoxic Respiratory Failure - Improving, diuresis as above      Length of Stay: 26  Romie Minus, MD  03/04/2023, 11:59 AM  Advanced Heart Failure Team Pager [phone] (M-F; 7a - 5p)  Please contact [facility] for night-coverage after hours (5p -7a ) and weekends on [email or web address]  CRITICAL CARE Performed by: Romie Minus   Total critical care time: 45 minutes  Critical care time was exclusive of separately billable procedures and treating other patients.  Critical care was necessary to treat or prevent imminent or life-threatening deterioration.  Critical care was time spent personally by me on the following activities: development of treatment plan with patient and/or surrogate as well as nursing, discussions with consultants, evaluation of patient's response to treatment, examination of patient, obtaining history from patient or surrogate, ordering and performing treatments and interventions, ordering and review of laboratory studies, ordering and review of radiographic studies, pulse oximetry and re-evaluation of patient's condition.

## 2023-03-04 NOTE — Progress Notes (Signed)
      [address]       [name] [address]             [phone]      Sedated after Seroquel  BP 106/78   Pulse 78   Temp (!) 97.3 F (36.3 C) (Bladder)   Resp 13   Ht 5\' 8"  (1.727 m)   Wt 68.3 kg   SpO2 100%   BMI 22.89 kg/m  Off vaso, norepi @ 7, milrinone 0.3   Intake/Output Summary (Last 24 hours) at 03/04/2023 1748 Last data filed at 03/04/2023 1700 Gross per 24 hour  Intake 3198.11 ml  Output 2505 ml  Net 693.11 ml   Confusion persists, not surprising under the circumstances  Viviann Spare [name], MD [facility] [phone]

## 2023-03-04 NOTE — Progress Notes (Signed)
SLP Cancellation Note  Patient Details Name: Walter Vargas MRN: [id number] DOB: [birth year]   Cancelled treatment:       Reason Eval/Treat Not Completed: sleepy after Seroquel last night per RN, just not ready for swallowing evaluation. Has cortrak. Will continue to follow. RN will reach out via Secure Chat if mentation improves.  Joran Kallal L. Samson Frederic, MA CCC/SLP Clinical Specialist - Acute Care SLP Acute Rehabilitation Services Office number [phone]   Blenda Mounts Laurice 03/04/2023, 9:36 AM

## 2023-03-04 NOTE — Progress Notes (Signed)
NAME:  KALYX SPADE, MRN:  [id number], DOB:  [birth year], LOS: 18 ADMISSION DATE:  02/14/2023, CONSULTATION DATE:  03/02/2023 REFERRING MD: Dorris Fetch - TCTS, CHIEF COMPLAINT: Postoperative management  History of Present Illness:  Encephalopathic, therefore HPI obtained from EMR review.  70 yoM with PMH significant for HTN, MR, HFrEF, and CAD admitted to [facility] 11/30 with acute on chronic HFrEF and NSTEMI.  Found to be in acute Biventricular HF with moderate to severe MR.  Treated with IV heparin, diuresed and transferred to [facility] for CABG evaluation on 12/4, admitted to HF team. Additionally tested positive for COVID 12/7, s/p completion of paxlovid with hospitalization complicated by intermittent delirium.  Underwent CABG x 5 on 12/19 by Dr. Dorris Fetch, complicated by cardiac stunning and failure to wean from bypass s/p with placement of Impella 5.5.  On return to ICU, pt coagulopathic with high CT output, VT event w/suction alarm, given multiple blood products, requiring return to OR for re-exploration without obvious source found, suspected due to coagulopathy.  PCCM consulted to assist with vent and medical management postop.   Pertinent Medical History:  HTN, MR, HFrEF, CAD  Significant Hospital Events: Including procedures, antibiotic start and stop dates in addition to other pertinent events   12/20 - PCCM consulted.  Interim History / Subjective:  Patient continued to require vasopressor support Epinephrine was titrated off Currently on milrinone, Levophed and vasopressin He was agitated overnight, on Precedex  Objective:  Blood pressure 106/78, pulse 75, temperature 97.9 F (36.6 C), resp. rate 15, height 5\' 8"  (1.727 m), weight 68.3 kg, SpO2 99%. PAP: (23-60)/(9-31) 51/19 CVP:  [0 mmHg-20 mmHg] 15 mmHg CO:  [3.8 L/min-5.3 L/min] 3.8 L/min CI:  [2.29 L/min/m2-3.2 L/min/m2] 2.3 L/min/m2      Intake/Output Summary (Last 24 hours) at 03/04/2023 0940 Last data filed at  03/04/2023 0800 Gross per 24 hour  Intake 3172.48 ml  Output 2056 ml  Net 1116.48 ml   Filed Weights   03/02/23 0500 03/03/23 0500 03/04/23 0500  Weight: 67.1 kg 67.3 kg 68.3 kg   Physical Examination: General: Acute on chronically ill-appearing elderly male, lying on the bed HEENT: [place]/AT, eyes anicteric.  moist mucus membranes Neuro: Sedated, eyes closed, does not open, not following commands Chest: Central sternotomy incision looks clean and dry, bilateral rhonchorous breath sounds  Heart: Regular rate and rhythm, no murmurs or gallops Abdomen: Soft, nontender, nondistended, bowel sounds present Skin: No rash  Labs and images reviewed  Resolved Hospital Problem list    Assessment & Plan:  Multivessel CAD s/p CABG x5 Acute NSTEMI Acute on chronic biventricular HFrEF with cardiogenic shock status post Impella 5.5 Post bypass vasoplegia/ cardiogenic shock s/p impella 5.5 Acute on chronic biventricular HF Expected post-operative ABLA with coagulopathy  Intraoperative TEE EF 20%, RV mildly reduced. S/p 4U PRBC, 4U FFP, 2U Cryo, 2 Plt. 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 He was agitated and restless overnight Started on Seroquel twice daily Titrate off Precedex  Best Practice (right click and "Reselect all SmartList Selections" daily)   Diet/type: NPO DVT prophylaxis SCD Pressure ulcer(s): N/A GI prophylaxis: PPI Lines: Central line, Arterial Line, and  yes and it is still needed Foley:  Yes, and it is still needed Code Status:  full code Last date of  multidisciplinary goals of care discussion [per primary team]  Critical care time:     The patient is critically ill due to cardiogenic shock/acute respiratory failure/status post CABG.  Critical care was necessary to treat or prevent imminent or life-threatening deterioration.  Critical care was time spent personally by me on the following activities: development of treatment plan with patient and/or surrogate as well as nursing, discussions with consultants, evaluation of patient's response to treatment, examination of patient, obtaining history from patient or surrogate, ordering and performing treatments and interventions, ordering and review of laboratory studies, ordering and review of radiographic studies, pulse oximetry, re-evaluation of patient's condition and participation in multidisciplinary rounds.   During this encounter critical care time was devoted to patient care services described in this note for 33 minutes.     Cheri Hogan, MD [facility] See Amion for pager If no response to pager, please call [phone] until 7pm After 7pm, Please call E-link [phone]   Please see [email or web address] for pager details.  From 7A-7P if no response, please call [phone] After hours, please call ELink [phone]

## 2023-03-04 NOTE — Progress Notes (Signed)
Inpatient Rehab Admissions Coordinator:   Per therapy recommendations,  patient was screened for CIR candidacy by Megan Salon, MS, CCC-SLPp. At this time, Pt. Appears to be a a potential candidate for CIR. I will place  order for rehab consult per protocol for full assessment. Please contact me any with questions.  Megan Salon, MS, CCC-SLP Rehab Admissions Coordinator  [phone] (celll) [phone] (office)

## 2023-03-04 NOTE — Progress Notes (Signed)
3 Days Post-Op Procedure(s) (LRB): EXPLORATION POST OPERATIVE OPEN HEART (N/A) Subjective: Sleeping currently, some agitation yesterday evening, slept after being given Seroquel last night  Objective: Vital signs in last 24 hours: Temp:  [97.2 F (36.2 C)-99.3 F (37.4 C)] 97.7 F (36.5 C) (12/22 0800) Pulse Rate:  [55-113] 75 (12/22 0800) Cardiac Rhythm: Normal sinus rhythm (12/22 0800) Resp:  [13-31] 22 (12/22 0800) BP: (106-109)/(78-81) 106/78 (12/21 1200) SpO2:  [87 %-100 %] 96 % (12/22 0800) Arterial Line BP: (86-136)/(60-106) 108/87 (12/22 0800) Weight:  [68.3 kg] 68.3 kg (12/22 0500)  Hemodynamic parameters for last 24 hours: PAP: (23-60)/(9-31) 51/19 CVP:  [0 mmHg-20 mmHg] 15 mmHg CO:  [3.8 L/min-5.3 L/min] 3.8 L/min CI:  [2.29 L/min/m2-3.2 L/min/m2] 2.3 L/min/m2  Intake/Output from previous day: 12/21 0701 - 12/22 0700 In: 3094.9 [I.V.:1571.5; Blood:417; NG/GT:890] Out: 2121 [Urine:2015; Chest Tube:106] Intake/Output this shift: Total I/O In: 169.8 [I.V.:129.8; NG/GT:40] Out: 60 [Urine:60]  General appearance: ill appearing Heart: regular rate and rhythm Lungs: diminished breath sounds right base Abdomen: normal findings: soft, non-tender  Lab Results: Recent Labs    03/03/23 1615 03/04/23 0414  WBC 9.8 8.8  HGB 8.1* 7.6*  HCT 23.3* 21.9*  PLT 75* 66*   BMET:  Recent Labs    03/03/23 1615 03/04/23 0414  NA 127* 127*  K 4.1 3.8  CL 97* 95*  CO2 22 24  GLUCOSE 114* 120*  BUN 17 16  CREATININE 0.60* 0.78  CALCIUM 7.7* 7.7*    PT/INR:  Recent Labs    03/03/23 0314  LABPROT 19.4*  INR 1.6*   ABG    Component Value Date/Time   PHART 7.420 03/02/2023 1552   HCO3 22.7 03/02/2023 1552   TCO2 24 03/02/2023 1552   ACIDBASEDEF 1.0 03/02/2023 1552   O2SAT 97 03/02/2023 1552   CBG (last 3)  Recent Labs    03/03/23 2304 03/04/23 0416 03/04/23 0723  GLUCAP 70 118* 121*    Assessment/Plan: S/P Procedure(s) (LRB): EXPLORATION POST  OPERATIVE OPEN HEART (N/A) POD # 3 NEURO- some delirium yesterday, not surprising given baseline and delirium preop CV- in SR with intermittent runs of junctional- paced during those episodes  Impella p8 4.3 L/min, 48/19 Ci 2.32  Will try to wean vasopressin today RESP- sat  OK on 4L [place]  Right effusion- will need to be tapped at some point RENAL- creatinine stable  Diurese as tolerated, supplement K ENDO- CBG well controlled GI- tolerating TF Anemia- Hgb 7.6, still on multiple pressors- will transfuse 1 unit Thrombocytopenia- PLT down slightly to 66k, no evidence of active bleeding  Avoid heparin, monitor   LOS: 18 days    [name] 03/04/2023

## 2023-03-04 NOTE — Progress Notes (Signed)
OT Cancellation Note  Patient Details Name: Walter Vargas MRN: [id number] DOB: [birth year]   Cancelled Treatment:    Reason Eval/Treat Not Completed: Fatigue/lethargy limiting ability to participate. RN reports lethargy due to medication and requests hold until tomorrow to optimize pt benefit and participation.   Tyler Deis, OTR/L [facility] Acute Rehabilitation Office: [phone]   Walter Vargas 03/04/2023, 3:10 PM

## 2023-03-05 ENCOUNTER — Inpatient Hospital Stay (HOSPITAL_COMMUNITY): Payer: 59

## 2023-03-05 DIAGNOSIS — J9601 Acute respiratory failure with hypoxia: Secondary | ICD-10-CM

## 2023-03-05 DIAGNOSIS — Z95811 Presence of heart assist device: Secondary | ICD-10-CM

## 2023-03-05 LAB — POCT I-STAT 7, (LYTES, BLD GAS, ICA,H+H)
Acid-Base Excess: 3 mmol/L — ABNORMAL HIGH (ref 0.0–2.0)
Bicarbonate: 26.2 mmol/L (ref 20.0–28.0)
Calcium, Ion: 1.15 mmol/L (ref 1.15–1.40)
HCT: 30 % — ABNORMAL LOW (ref 39.0–52.0)
Hemoglobin: 10.2 g/dL — ABNORMAL LOW (ref 13.0–17.0)
O2 Saturation: 96 %
Patient temperature: 37.5
Potassium: 3.6 mmol/L (ref 3.5–5.1)
Sodium: 137 mmol/L (ref 135–145)
TCO2: 27 mmol/L (ref 22–32)
pCO2 arterial: 35.3 mm[Hg] (ref 32–48)
pH, Arterial: 7.481 — ABNORMAL HIGH (ref 7.35–7.45)
pO2, Arterial: 75 mm[Hg] — ABNORMAL LOW (ref 83–108)

## 2023-03-05 LAB — COOXEMETRY PANEL
Carboxyhemoglobin: 1.9 % — ABNORMAL HIGH (ref 0.5–1.5)
Methemoglobin: <0.7 % (ref 0.0–1.5)
O2 Saturation: 72.2 %
Total hemoglobin: 10.6 g/dL — ABNORMAL LOW (ref 12.0–16.0)

## 2023-03-05 LAB — BASIC METABOLIC PANEL
Anion gap: 9 (ref 5–15)
Anion gap: 8 (ref 5–15)
Anion gap: 11 (ref 5–15)
BUN: 14 mg/dL (ref 8–23)
BUN: 16 mg/dL (ref 8–23)
BUN: 16 mg/dL (ref 8–23)
CO2: 23 mmol/L (ref 22–32)
CO2: 26 mmol/L (ref 22–32)
CO2: 24 mmol/L (ref 22–32)
Calcium: 8.1 mg/dL — ABNORMAL LOW (ref 8.9–10.3)
Calcium: 8 mg/dL — ABNORMAL LOW (ref 8.9–10.3)
Calcium: 8 mg/dL — ABNORMAL LOW (ref 8.9–10.3)
Chloride: 102 mmol/L (ref 98–111)
Chloride: 99 mmol/L (ref 98–111)
Chloride: 100 mmol/L (ref 98–111)
Creatinine, Ser: 0.67 mg/dL (ref 0.61–1.24)
Creatinine, Ser: 0.53 mg/dL — ABNORMAL LOW (ref 0.61–1.24)
Creatinine, Ser: 0.58 mg/dL — ABNORMAL LOW (ref 0.61–1.24)
GFR, Estimated: >60 mL/min (ref >60)
GFR, Estimated: >60 mL/min (ref >60)
GFR, Estimated: >60 mL/min (ref >60)
Glucose, Bld: 118 mg/dL — ABNORMAL HIGH (ref 70–99)
Glucose, Bld: 112 mg/dL — ABNORMAL HIGH (ref 70–99)
Glucose, Bld: 115 mg/dL — ABNORMAL HIGH (ref 70–99)
Potassium: 4.2 mmol/L (ref 3.5–5.1)
Potassium: 3.7 mmol/L (ref 3.5–5.1)
Potassium: 3.8 mmol/L (ref 3.5–5.1)
Sodium: 134 mmol/L — ABNORMAL LOW (ref 135–145)
Sodium: 133 mmol/L — ABNORMAL LOW (ref 135–145)
Sodium: 135 mmol/L (ref 135–145)

## 2023-03-05 LAB — BPAM RBC
Blood Product Expiration Date: 202501172359
Blood Product Expiration Date: 202501172359
ISSUE DATE / TIME: 202412211011
ISSUE DATE / TIME: 202412220851
Unit Type and Rh: 5100
Unit Type and Rh: 5100

## 2023-03-05 LAB — CBC
HCT: 29.3 % — ABNORMAL LOW (ref 39.0–52.0)
HCT: 31.6 % — ABNORMAL LOW (ref 39.0–52.0)
Hemoglobin: 9.9 g/dL — ABNORMAL LOW (ref 13.0–17.0)
Hemoglobin: 10.6 g/dL — ABNORMAL LOW (ref 13.0–17.0)
MCH: 29.6 pg (ref 26.0–34.0)
MCH: 29.3 pg (ref 26.0–34.0)
MCHC: 33.8 g/dL (ref 30.0–36.0)
MCHC: 33.5 g/dL (ref 30.0–36.0)
MCV: 87.5 fL (ref 80.0–100.0)
MCV: 87.3 fL (ref 80.0–100.0)
Platelets: 79 10*3/uL — ABNORMAL LOW (ref 150–400)
Platelets: 81 10*3/uL — ABNORMAL LOW (ref 150–400)
RBC: 3.35 MIL/uL — ABNORMAL LOW (ref 4.22–5.81)
RBC: 3.62 MIL/uL — ABNORMAL LOW (ref 4.22–5.81)
RDW: 16.2 % — ABNORMAL HIGH (ref 11.5–15.5)
RDW: 16.8 % — ABNORMAL HIGH (ref 11.5–15.5)
WBC: 8.3 10*3/uL (ref 4.0–10.5)
WBC: 8 10*3/uL (ref 4.0–10.5)
nRBC: 0 % (ref 0.0–0.2)
nRBC: 0 % (ref 0.0–0.2)

## 2023-03-05 LAB — GLUCOSE, CAPILLARY
Glucose-Capillary: 102 mg/dL — ABNORMAL HIGH (ref 70–99)
Glucose-Capillary: 117 mg/dL — ABNORMAL HIGH (ref 70–99)
Glucose-Capillary: 98 mg/dL (ref 70–99)
Glucose-Capillary: 106 mg/dL — ABNORMAL HIGH (ref 70–99)
Glucose-Capillary: 96 mg/dL (ref 70–99)
Glucose-Capillary: 106 mg/dL — ABNORMAL HIGH (ref 70–99)
Glucose-Capillary: 114 mg/dL — ABNORMAL HIGH (ref 70–99)

## 2023-03-05 LAB — TYPE AND SCREEN
ABO/RH(D): O POS
Antibody Screen: NEGATIVE
Unit division: 0
Unit division: 0

## 2023-03-05 LAB — MAGNESIUM
Magnesium: 2 mg/dL (ref 1.7–2.4)
Magnesium: 1.9 mg/dL (ref 1.7–2.4)
Magnesium: 1.8 mg/dL (ref 1.7–2.4)

## 2023-03-05 LAB — PHOSPHORUS: Phosphorus: 2.1 mg/dL — ABNORMAL LOW (ref 2.5–4.6)

## 2023-03-05 MED ORDER — FUROSEMIDE 10 MG/ML IJ SOLN
4.0000 mg/h | INTRAVENOUS | Status: DC
  Administered 2023-03-05 – 2023-03-07 (×2): 4 mg/h via INTRAVENOUS
  Filled 2023-03-05 (×2): qty 20

## 2023-03-05 MED ORDER — SODIUM CHLORIDE 0.9% IV SOLUTION: INTRAVENOUS | Status: DC | PRN

## 2023-03-05 MED ORDER — FUROSEMIDE 10 MG/ML IJ SOLN
40.0000 mg | Freq: Once | INTRAMUSCULAR | Status: AC
  Administered 2023-03-05: 40 mg via INTRAVENOUS
  Filled 2023-03-05: qty 4

## 2023-03-05 MED ORDER — MAGNESIUM SULFATE 2 GM/50ML IV SOLN
2.0000 g | Freq: Once | INTRAVENOUS | Status: DC
  Administered 2023-03-05: 2 g via INTRAVENOUS
  Filled 2023-03-05: qty 50

## 2023-03-05 MED ORDER — POTASSIUM CHLORIDE 10 MEQ/100ML IV SOLN
10.0000 meq | INTRAVENOUS | Status: AC
  Administered 2023-03-05 (×4): 10 meq via INTRAVENOUS
  Filled 2023-03-05 (×4): qty 100

## 2023-03-05 MED ORDER — POTASSIUM CHLORIDE 10 MEQ/50ML IV SOLN
10.0000 meq | INTRAVENOUS | Status: AC
  Administered 2023-03-05 – 2023-03-06 (×3): 10 meq via INTRAVENOUS
  Filled 2023-03-05 (×3): qty 50

## 2023-03-05 MED ORDER — VITAL 1.5 CAL PO LIQD
1000.0000 mL | ORAL | Status: DC
  Administered 2023-03-06 – 2023-03-08 (×4): 1000 mL
  Filled 2023-03-05 (×4): qty 1000

## 2023-03-05 MED ORDER — FUROSEMIDE 10 MG/ML IJ SOLN: 40.0000 mg | Freq: Once | INTRAMUSCULAR | Status: DC

## 2023-03-05 MED ORDER — QUETIAPINE FUMARATE 25 MG PO TABS
25.0000 mg | ORAL_TABLET | Freq: Every day | ORAL | Status: DC
  Administered 2023-03-05 – 2023-03-10 (×6): 25 mg via ORAL
  Filled 2023-03-05 (×6): qty 1

## 2023-03-05 MED ORDER — SPIRONOLACTONE 12.5 MG HALF TABLET
12.5000 mg | ORAL_TABLET | Freq: Every day | ORAL | Status: DC
Start: 2023-03-05 — End: 2023-03-09
  Administered 2023-03-05 – 2023-03-08 (×4): 12.5 mg via ORAL
  Filled 2023-03-05 (×5): qty 1

## 2023-03-05 MED ORDER — THIAMINE MONONITRATE 100 MG PO TABS
100.0000 mg | ORAL_TABLET | Freq: Every day | ORAL | Status: DC
  Administered 2023-03-05 – 2023-03-08 (×4): 100 mg
  Filled 2023-03-05 (×4): qty 1

## 2023-03-05 NOTE — Plan of Care (Signed)
Problem: Health Behavior/Discharge Planning: Goal: Ability to manage health-related needs will improve 03/05/2023 0135 by Jerold Coombe, RN Outcome: Progressing 03/05/2023 0135 by Jerold Coombe, RN Outcome: Progressing   Problem: Clinical Measurements: Goal: Ability to maintain clinical measurements within normal limits will improve 03/05/2023 0135 by Jerold Coombe, RN Outcome: Progressing 03/05/2023 0135 by Jerold Coombe, RN Outcome: Progressing Goal: Will remain free from infection 03/05/2023 0135 by Jerold Coombe, RN Outcome: Progressing 03/05/2023 0135 by Jerold Coombe, RN Outcome: Progressing Goal: Diagnostic test results will improve 03/05/2023 0135 by Jerold Coombe, RN Outcome: Progressing 03/05/2023 0135 by Jerold Coombe, RN Outcome: Progressing Goal: Respiratory complications will improve 03/05/2023 0135 by Jerold Coombe, RN Outcome: Progressing 03/05/2023 0135 by Jerold Coombe, RN Outcome: Progressing Goal: Cardiovascular complication will be avoided 03/05/2023 0135 by Jerold Coombe, RN Outcome: Progressing 03/05/2023 0135 by Jerold Coombe, RN Outcome: Progressing   Problem: Activity: Goal: Risk for activity intolerance will decrease 03/05/2023 0135 by Jerold Coombe, RN Outcome: Progressing 03/05/2023 0135 by Jerold Coombe, RN Outcome: Progressing   Problem: Coping: Goal: Level of anxiety will decrease 03/05/2023 0135 by Jerold Coombe, RN Outcome: Progressing 03/05/2023 0135 by Jerold Coombe, RN Outcome: Progressing   Problem: Elimination: Goal: Will not experience complications related to bowel motility 03/05/2023 0135 by Jerold Coombe, RN Outcome: Progressing 03/05/2023 0135 by Jerold Coombe, RN Outcome: Progressing Goal: Will not experience complications related to urinary retention 03/05/2023 0135 by Jerold Coombe, RN Outcome: Progressing 03/05/2023 0135 by Jerold Coombe, RN Outcome: Progressing    Problem: Pain Management: Goal: General experience of comfort will improve 03/05/2023 0135 by Jerold Coombe, RN Outcome: Progressing 03/05/2023 0135 by Jerold Coombe, RN Outcome: Progressing   Problem: Safety: Goal: Ability to remain free from injury will improve 03/05/2023 0135 by Jerold Coombe, RN Outcome: Progressing 03/05/2023 0135 by Jerold Coombe, RN Outcome: Progressing   Problem: Skin Integrity: Goal: Risk for impaired skin integrity will decrease 03/05/2023 0135 by Jerold Coombe, RN Outcome: Progressing 03/05/2023 0135 by Jerold Coombe, RN Outcome: Progressing   Problem: Education: Goal: Knowledge of disease or condition will improve 03/05/2023 0135 by Jerold Coombe, RN Outcome: Progressing 03/05/2023 0135 by Jerold Coombe, RN Outcome: Progressing Goal: Understanding of medication regimen will improve 03/05/2023 0135 by Jerold Coombe, RN Outcome: Progressing 03/05/2023 0135 by Jerold Coombe, RN Outcome: Progressing Goal: Individualized Educational Video(s) 03/05/2023 0135 by Jerold Coombe, RN Outcome: Progressing 03/05/2023 0135 by Jerold Coombe, RN Outcome: Progressing   Problem: Activity: Goal: Ability to tolerate increased activity will improve 03/05/2023 0135 by Jerold Coombe, RN Outcome: Progressing 03/05/2023 0135 by Jerold Coombe, RN Outcome: Progressing   Problem: Cardiac: Goal: Ability to achieve and maintain adequate cardiopulmonary perfusion will improve 03/05/2023 0135 by Jerold Coombe, RN Outcome: Progressing 03/05/2023 0135 by Jerold Coombe, RN Outcome: Progressing   Problem: Health Behavior/Discharge Planning: Goal: Ability to safely manage health-related needs after discharge will improve 03/05/2023 0135 by Jerold Coombe, RN Outcome: Progressing 03/05/2023 0135 by Jerold Coombe, RN Outcome: Progressing   Problem: Education: Goal: Will demonstrate proper wound care and an understanding of methods  to prevent future damage 03/05/2023 0135 by Jerold Coombe, RN Outcome: Progressing 03/05/2023 0135 by Jerold Coombe, RN Outcome: Progressing Goal: Knowledge of disease or condition will improve 03/05/2023 0135 by Jerold Coombe, RN Outcome: Progressing 03/05/2023 0135 by Jerold Coombe, RN Outcome: Progressing Goal: Knowledge of the prescribed therapeutic regimen will improve 03/05/2023 0135 by Jerold Coombe, RN Outcome: Progressing 03/05/2023 0135 by Jerold Coombe, RN Outcome: Progressing Goal: Individualized Educational Video(s) 03/05/2023 0135 by Jerold Coombe, RN Outcome:  Progressing 03/05/2023 0135 by Jerold Coombe, RN Outcome: Progressing   Problem: Activity: Goal: Risk for activity intolerance will decrease 03/05/2023 0135 by Jerold Coombe, RN Outcome: Progressing 03/05/2023 0135 by Jerold Coombe, RN Outcome: Progressing   Problem: Cardiac: Goal: Will achieve and/or maintain hemodynamic stability 03/05/2023 0135 by Jerold Coombe, RN Outcome: Progressing 03/05/2023 0135 by Jerold Coombe, RN Outcome: Progressing   Problem: Clinical Measurements: Goal: Postoperative complications will be avoided or minimized 03/05/2023 0135 by Jerold Coombe, RN Outcome: Progressing 03/05/2023 0135 by Jerold Coombe, RN Outcome: Progressing   Problem: Respiratory: Goal: Respiratory status will improve 03/05/2023 0135 by Jerold Coombe, RN Outcome: Progressing 03/05/2023 0135 by Jerold Coombe, RN Outcome: Progressing   Problem: Skin Integrity: Goal: Wound healing without signs and symptoms of infection 03/05/2023 0135 by Jerold Coombe, RN Outcome: Progressing 03/05/2023 0135 by Jerold Coombe, RN Outcome: Progressing Goal: Risk for impaired skin integrity will decrease 03/05/2023 0135 by Jerold Coombe, RN Outcome: Progressing 03/05/2023 0135 by Jerold Coombe, RN Outcome: Progressing   Problem: Urinary Elimination: Goal: Ability to  achieve and maintain adequate renal perfusion and functioning will improve 03/05/2023 0135 by Jerold Coombe, RN Outcome: Progressing 03/05/2023 0135 by Jerold Coombe, RN Outcome: Progressing   Problem: Education: Goal: Knowledge of General Education information will improve Description: Including pain rating scale, medication(s)/side effects and non-pharmacologic comfort measures 03/05/2023 0135 by Jerold Coombe, RN Outcome: Progressing 03/05/2023 0135 by Jerold Coombe, RN Outcome: Progressing   Problem: Health Behavior/Discharge Planning: Goal: Ability to manage health-related needs will improve 03/05/2023 0135 by Jerold Coombe, RN Outcome: Progressing 03/05/2023 0135 by Jerold Coombe, RN Outcome: Progressing   Problem: Clinical Measurements: Goal: Ability to maintain clinical measurements within normal limits will improve 03/05/2023 0135 by Jerold Coombe, RN Outcome: Progressing 03/05/2023 0135 by Jerold Coombe, RN Outcome: Progressing Goal: Will remain free from infection 03/05/2023 0135 by Jerold Coombe, RN Outcome: Progressing 03/05/2023 0135 by Jerold Coombe, RN Outcome: Progressing Goal: Diagnostic test results will improve 03/05/2023 0135 by Jerold Coombe, RN Outcome: Progressing 03/05/2023 0135 by Jerold Coombe, RN Outcome: Progressing Goal: Respiratory complications will improve 03/05/2023 0135 by Jerold Coombe, RN Outcome: Progressing 03/05/2023 0135 by Jerold Coombe, RN Outcome: Progressing Goal: Cardiovascular complication will be avoided 03/05/2023 0135 by Jerold Coombe, RN Outcome: Progressing 03/05/2023 0135 by Jerold Coombe, RN Outcome: Progressing   Problem: Activity: Goal: Risk for activity intolerance will decrease 03/05/2023 0135 by Jerold Coombe, RN Outcome: Progressing 03/05/2023 0135 by Jerold Coombe, RN Outcome: Progressing   Problem: Nutrition: Goal: Adequate nutrition will be maintained 03/05/2023  0135 by Jerold Coombe, RN Outcome: Progressing 03/05/2023 0135 by Jerold Coombe, RN Outcome: Progressing   Problem: Coping: Goal: Level of anxiety will decrease 03/05/2023 0135 by Jerold Coombe, RN Outcome: Progressing 03/05/2023 0135 by Jerold Coombe, RN Outcome: Progressing   Problem: Elimination: Goal: Will not experience complications related to bowel motility 03/05/2023 0135 by Jerold Coombe, RN Outcome: Progressing 03/05/2023 0135 by Jerold Coombe, RN Outcome: Progressing Goal: Will not experience complications related to urinary retention 03/05/2023 0135 by Jerold Coombe, RN Outcome: Progressing 03/05/2023 0135 by Jerold Coombe, RN Outcome: Progressing   Problem: Pain Management: Goal: General experience of comfort will improve 03/05/2023 0135 by Jerold Coombe, RN Outcome: Progressing 03/05/2023 0135 by Jerold Coombe, RN Outcome: Progressing   Problem: Safety: Goal: Ability to remain free from injury will improve 03/05/2023 0135 by Jerold Coombe, RN Outcome: Progressing 03/05/2023 0135 by Jerold Coombe, RN Outcome: Progressing   Problem: Skin Integrity: Goal: Risk for impaired skin integrity will decrease 03/05/2023 0135  by Jerold Coombe, RN Outcome: Progressing 03/05/2023 0135 by Jerold Coombe, RN Outcome: Progressing   Problem: Education: Goal: Knowledge of risk factors and measures for prevention of condition will improve 03/05/2023 0135 by Jerold Coombe, RN Outcome: Progressing 03/05/2023 0135 by Jerold Coombe, RN Outcome: Progressing

## 2023-03-05 NOTE — Progress Notes (Addendum)
Advanced Heart Failure Rounding Note  PCP-Cardiologist: None  Chief Complaint: Cardiogenic Shock  Subjective:    12/19: S/p CABG and Impella 5.5 Insertion. Coagulopathic on return from OR, high chest tube output, required multiple blood products. Subsequent VT associated w/ Impella suction alarms. Back to OR for re-exploratation, bleeding from EPW.   POD# 4  Hgb improved, 7.6>> 9.9 post 1 u RBCs yesterday. Plts trending up, 63>>79K   Impella at P-8, Flow 4.5L. No further suction alarms.   On NE 1 + Milrinone 0.3. Off VP. Walter Vargas out. Co-ox 72%.   Good response to IV Lasix yesterday w/ 4.9L in UOP. CVP 6 but still up 25 lb from pre-op wt. SCr stable 0.67. K 4.2   NSR on tele. Amio gtt @ 30  Somnolent but arousable and follow commands. Required sequale over the weekend due to agitation.     Objective:   Weight Range: 65.3 kg Body mass index is 21.89 kg/m.   Vital Signs:   Temp:  [97.3 F (36.3 C)-99.5 F (37.5 C)] 99.5 F (37.5 C) (12/23 0400) Pulse Rate:  [68-89] 78 (12/23 0730) Resp:  [12-24] 19 (12/23 0730) SpO2:  [92 %-100 %] 94 % (12/23 0730) Arterial Line BP: (90-126)/(61-99) 117/74 (12/23 0730) Weight:  [65.3 kg] 65.3 kg (12/23 0500) Last BM Date : 03/04/23  Weight change: Filed Weights   03/03/23 0500 03/04/23 0500 03/05/23 0500  Weight: 67.3 kg 68.3 kg 65.3 kg   Intake/Output:   Intake/Output Summary (Last 24 hours) at 03/05/2023 0738 Last data filed at 03/05/2023 0700 Gross per 24 hour  Intake 3145.4 ml  Output 4910 ml  Net -1764.6 ml    Physical Exam   CVP 6  General:  thin, chronically ill appearing elderly male  HEENT: normal Neck: supple. JVD not well visualized, + rt subclavian Impella 5.5, site stable. Carotids 2+ bilat; no bruits. No lymphadenopathy or thyromegaly appreciated. Cor: PMI nondisplaced. Regular rate & rhythm. No rubs, gallops or murmurs. Lungs: course/rhonchorous bilaterally  Abdomen: soft, nontender, nondistended. No  hepatosplenomegaly. No bruits or masses. Good bowel sounds. Extremities: no cyanosis, clubbing, rash, 2+ b/l LE edema + SCDs Neuro: somnolent but arousable and follows commands  GU: + foley   Telemetry   NSR 70s   Patient Profile   Walter Vargas is a 77 y.o. history of HTN, Walter, HFrEF, and severe 3V CAD.  S/p CABG and Impella 5.5 Insertion 12/19 w D.r Dorris Fetch.   Assessment/Plan   Cardiogenic Postoperative Shock - Biventricular failure at b/l. Pre CABG echo EF < 20%, RV moderately reduced (PAPi was adequate on RHC) - Intra-op course complicated by cardiac stun and failure to wean from bypass. Impella 5.5 placed  - intra op TEE EF 20%, RV mildly reduced  - c/w Impella 5.5 @ P8. Flow 4.5LPM - on NE 1 + Milrinone 0.3. Walter Vargas is out. co-ox 72%  - CVP 6 but up 25 lb from pre-op. Responding well w/ IV lasix w/ stable SCr. Continue to diurese w/ IV Lasix, place on lasix gtt at 4/hr   - continue current Impella and inotropic support support   2. CAD: Patient admitted with NSTEMI (?culprit D2) but with severe extensive diffuse 3 vessel disease.  Disease too extensive to treat percutaneously.  Anatomy felt best served by CABG. - s/p CABG and Impella 5.5 insertion w Dr. Dorris Fetch 12/19. - Continue ASA 81  - Continue atorva 80 - no ? blocker w/ shock   3. Acute on chronic systolic CHF:  Patient has had HF symptoms for at least a couple of months.  Suspect pre-existing ischemic cardiomyopathy then he presented this admission with NSTEMI possible in D2 territory leading to CHF exacerbation. RHC 12/3 showed normal filling pressures and low CI at 2.0. Echo 12/4: EF 20%, RV mild-moderately dysfunctional, mild-moderate Walter.  - in CGS as above - continue Impella and milrinone support until fully diuresed - continue IV Lasix, start gtt at 4/hr  - Hold GDMT with impella, can ambulate - Will need repeat Echo closer to discharge  - Follow renal function/ UOP   4. Post-Operative Bleeding/  ABLA -coagulopathic on return from OR - Supported with multiple units of blood products including RBCs, cryo, FFP and Factor VII + albumin and protamine, had to return to the OR. Bleeding noted around chest tubes - now stable. CTs pulled - Hgb stable at 9.9 today  - continue to monitor closely   5. 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  Patient has had on and off confusion in hospital.  - Ongoing, appreciate CCM management  6. Gallbladder disease: Patient had gallstones but HIDA scan not suggestive of cholecystitis.  7. COVID+: Completed Paxlovid  8. Fall: Had inpatient fall. No injuries. Did not hit his head.   9. 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  10. Acute Hypoxic Respiratory Failure - now extubated  - continue diuresis as above      Length of Stay: [address], PA-C  03/05/2023, 7:38 AM  Advanced Heart Failure Team Pager [phone] (M-F; 7a - 5p)  Please contact [facility] for night-coverage after hours (5p -7a ) and weekends on [email or web address]  Agree with above.   Extubated. Awake but somewhat delirious. Remains on milrinone and Impella 5.5. Hemodynamics look good.   Rhythm stable. Hgb ok   Weight still up about 25 pounds.   General:  Sitting up in bed. Mildly confused HEENT: normal Neck: supple.RIJ introducer. Carotids 2+ bilat; no bruits. No lymphadenopathy or thryomegaly appreciated. Cor: Sternal wound ok  Regular rate & rhythm. No rubs, gallops or murmurs. Impella graft ok Lungs: coarse  Abdomen: soft, nontender, nondistended. No hepatosplenomegaly. No bruits or masses. Good bowel sounds. Extremities: no cyanosis, clubbing, rash, 2+ edema Neuro:mildly confused. O/w nonfocal   He is improving but still tenuous. Needs volume off. Will continue Impella. (Waveforms look good) start lasix gtt at 4. Increase as  needed  I did bedside echo severe biventricular dysfunction. LVEF 25% RV moderately down. Impella well positioned  CRITICAL CARE Performed by: Arvilla Meres  Total critical care time: 40 minutes  Critical care time was exclusive of separately billable procedures and treating other patients.  Critical care was necessary to treat or prevent imminent or life-threatening deterioration.  Critical care was time spent personally by me (independent of midlevel providers or residents) on the following activities: development of treatment plan with patient and/or surrogate as well as nursing, discussions with consultants, evaluation of patient's response to treatment, examination of patient, obtaining history from patient or surrogate, ordering and performing treatments and interventions, ordering and review of laboratory studies, ordering and review of radiographic studies, pulse oximetry and re-evaluation of patient's condition.  Arvilla Meres, MD  12:40 PM

## 2023-03-05 NOTE — Evaluation (Signed)
Clinical/Bedside Swallow Evaluation Patient Details  Name: Walter Vargas MRN: [id number] Date of Birth: 1945-10-03  Today's Date: 03/05/2023 Time: SLP Start Time (ACUTE ONLY): 1343 SLP Stop Time (ACUTE ONLY): 1356 SLP Time Calculation (min) (ACUTE ONLY): 13 min  Past Medical History:  Past Medical History:  Diagnosis Date   Essential hypertension    HFrEF (heart failure with reduced ejection fraction) (HCC)    Mitral regurgitation    Past Surgical History:  Past Surgical History:  Procedure Laterality Date   CORONARY ARTERY BYPASS GRAFT N/A 03/01/2023   Procedure: CORONARY ARTERY BYPASS GRAFTING X 5, USING LEFT INTERNAL MAMMARY ARTERY AND ENDOSCOPICALLY HARVESTED RIGHT SAPHENOUS VEIN GRAFT;  Surgeon: [name], MD;  Location: [facility] OR;  Service: Open Heart Surgery;  Laterality: N/A;   EXPLORATION POST OPERATIVE OPEN HEART N/A 03/01/2023   Procedure: EXPLORATION POST OPERATIVE OPEN HEART;  Surgeon: [name], MD;  Location: [facility] OR;  Service: Open Heart Surgery;  Laterality: N/A;   NO PAST SURGERIES     PLACEMENT OF IMPELLA LEFT VENTRICULAR ASSIST DEVICE  03/01/2023   Procedure: PLACEMENT OF IMPELLA LEFT VENTRICULAR ASSIST DEVICE;  Surgeon: [name], MD;  Location: [facility] OR;  Service: Open Heart Surgery;;   RIGHT/LEFT HEART CATH AND CORONARY ANGIOGRAPHY N/A 02/13/2023   Procedure: RIGHT/LEFT HEART CATH AND CORONARY ANGIOGRAPHY;  Surgeon: Yvonne Kendall, MD;  Location: [facility] INVASIVE CV LAB;  Service: Cardiovascular;  Laterality: N/A;   TEE WITHOUT CARDIOVERSION N/A 03/01/2023   Procedure: TRANSESOPHAGEAL ECHOCARDIOGRAM (TEE);  Surgeon: [name], MD;  Location: [facility] OR;  Service: Open Heart Surgery;  Laterality: N/A;   HPI:  77 year old male with chronic HFrEF was initially admitted 11/30 with acute NSTEMI and acute on chronic biventricular HFrEF with moderate to severe MR, underwent CABG x  5 12/19, post-procedure complicated with cardiac  extending requiring Impella 5/5; 12/20 back to OR for re-exploration d/t post-op bleeding/ABLA.  Intubated 12/19-20. Cortrak 12/20.    Assessment / Plan / Recommendation  Clinical Impression  Pt participated in limited clinical swallow assessment given degree of fatigue, weakness, and poor participation. Oral mechanism exam revealed bloody secretions on posterior pharyngeal wall; removed with oral suctioning. Pt followed minimal orofacial commands; did not vocalize nor speak despite cues.  He accepted ice chips, which he masticated with sufficient effort; swallow was palpable and ice did not elicit coughing. No further POs given due to the aforementioned conditions. RN present - recommend offering 3-4 ice chips to help stimulate spontaneous swallowing after each episode of oral care. SLP will follow for readiness to advance trials and/or participate in instrumental swallow study. Continue NPO otherwise. SLP Visit Diagnosis: Dysphagia, oropharyngeal phase (R13.12)    Aspiration Risk    tba   Diet Recommendation   NPO  Medication Administration: Via alternative means    Other  Recommendations Oral Care Recommendations: Oral care prior to ice chip/H20    Recommendations for follow up therapy are one component of a multi-disciplinary discharge planning process, led by the attending physician.  Recommendations may be updated based on patient status, additional functional criteria and insurance authorization.  Follow up Recommendations Other (comment) (tba)          Functional Status Assessment Patient has had a recent decline in their functional status and demonstrates the ability to make significant improvements in function in a reasonable and predictable amount of time.  Frequency and Duration min 2x/week  2 weeks       Prognosis Prognosis for improved oropharyngeal  function: Fair      Swallow Study   General Date of Onset: 02/14/23 HPI: 77 year old male with chronic HFrEF was  initially admitted 11/30 with acute NSTEMI and acute on chronic biventricular HFrEF with moderate to severe MR, underwent CABG x  5 12/19, post-procedure complicated with cardiac extending requiring Impella 5/5; 12/20 back to OR for re-exploration d/t post-op bleeding/ABLA.  Intubated 12/19-20. Cortrak 12/20. Type of Study: Bedside Swallow Evaluation Previous Swallow Assessment: no Diet Prior to this Study: NPO;Cortrak/Small bore NG tube Temperature Spikes Noted: No Respiratory Status: Nasal cannula History of Recent Intubation: Yes Total duration of intubation (days): 1 days Date extubated: 03/02/23 Behavior/Cognition: Lethargic/Drowsy Oral Cavity Assessment: Dried secretions Oral Care Completed by SLP: Yes Oral Cavity - Dentition: Missing dentition Self-Feeding Abilities: Total assist Patient Positioning: Partially reclined Baseline Vocal Quality: Not observed Volitional Cough: Cognitively unable to elicit Volitional Swallow: Unable to elicit    Oral/Motor/Sensory Function Overall Oral Motor/Sensory Function: Within functional limits   Ice Chips Ice chips: Within functional limits Presentation: Spoon   Thin Liquid Thin Liquid: Not tested    Nectar Thick Nectar Thick Liquid: Not tested   Honey Thick Honey Thick Liquid: Not tested   Puree Puree: Not tested   Solid     Solid: Not tested      Blenda Mounts Laurice 03/05/2023,2:12 PM Marchelle Folks L. Samson Frederic, MA CCC/SLP Clinical Specialist - Acute Care SLP Acute Rehabilitation Services Office number [phone]

## 2023-03-05 NOTE — Progress Notes (Signed)
Nutrition Follow-up  DOCUMENTATION CODES:   Severe malnutrition in context of chronic illness  INTERVENTION:   Tube Feeding via Cortrak: Vital 1.5 at 55 ml/hr Increase to 30 ml/hr, titrate by 10 mL q 8 hours until goal rate of 55 ml/hr Pro-Source TF20 60 mL daily TF at goal provides 2060 kcals, 109 g of protein and 1003 mL of free water  Add Thiamine 100 mg daily x 7 days   Add phosphorus on to AM labs today; if low, recommend supplementation and recommend checking for the next several days  Monitor bowel function; +type 6 stool today. Consider changing bowel regimen from schedule to prn   NUTRITION DIAGNOSIS:   Severe Malnutrition related to chronic illness (HFrEF, 3V CAD, MR) as evidenced by severe fat depletion, severe muscle depletion.  Being addressed via TF   GOAL:   Patient will meet greater than or equal to 90% of their needs  Progressing  MONITOR:   Labs, Vent status, Weight trends, TF tolerance, I & O's  REASON FOR ASSESSMENT:   Ventilator    ASSESSMENT:   Pt remains admitted with heart failure, NSTEMI and cardiogenic shock. PMH significant for HTN, MR, HFrEF and severe 3V CAD.  11/30: admitted 12/19: s/p CABG and impella 5/5 placement 12/20: back to OR for re-exploration d/t post-op bleeding/ABLA; cortrak placed, trickle TF initiated, extubated   Impella at P8 Tolerating trickle of Vital 1.5 at 20 ml/hr via Cortrak. Pt remains somnolent.  Discussed plan for advancement of TF with Dr. Merrily Pew; MD agrees with beginning titration towards goal  Remains on lasix gtt; currently off levophed/vasopressin  Noted last phosphorus on 12/21 was 2.0 and has not been rechecked since. Plan to check via add-on from today's labs and monitor for the next few days given refeeding risk and planning to titrate from trickle to goal  Noted weight of 48.5 kg on 12/12; current wt 65.3 kg with highest wt of 68.3 kg  Labs: CBGs 90-121, sodium 134 (L), BUN/Creatinine wdl,  magnesium 2.0 (wdl), phosphorus 2.0 (L on 12/214) Meds: dulcolax daily, colace   Diet Order:   Diet Order     None       EDUCATION NEEDS:   No education needs have been identified at this time  Skin:  Skin Assessment: Skin Integrity Issues: Skin Integrity Issues:: Incisions Incisions: Chest, R leg inicision closed  Last BM:  12/23 type 6 medium  Height:   Ht Readings from Last 1 Encounters:  03/01/23 5\' 8"  (1.727 m)    Weight:   Wt Readings from Last 1 Encounters:  03/05/23 65.3 kg    Ideal Body Weight:     BMI:  Body mass index is 21.89 kg/m.  Estimated Nutritional Needs:   Kcal:  1900-2200 kcals  Protein:  90-110 g  Fluid:  1.7L    Romelle Starcher MS, RDN, LDN, CNSC Registered Dietitian 3 Clinical Nutrition RD Inpatient Contact Info in Amion

## 2023-03-05 NOTE — Evaluation (Signed)
Occupational Therapy Evaluation Patient Details Name: Walter Vargas MRN: [id number] DOB: Jun 28, 1945 Today's Date: 03/05/2023   History of Present Illness 77 y.o. male admitted 02/13/22 with NSTEMI, CHF exacerbation. LHC with 3-vessel CAD. CABG surgery delayed due to being covid +. S/p CABG x5, TEE, and placement of impella and swan-ganz 12/19. Coagulopathic on return from OR, high chest tube output, required multiple blood products. Back to OR for re-exploratation, bleeding from EPW. ETT 12/19 - 12/20. Chest tubes 12/19 - 12/21. Cortrak placed 12/20.  Pt with fall in room on 12/9. PMH includes HTN, HF, mod/severe MR.   Clinical Impression   PTA, per chart pt lived alone but family can stay when first transitioning home if needed. Upon arrival, pt lethargic and needing increased time to follow commands and provide verbal responses to questions. Minimal verbalizations this session. Pt needing max A for oral care from chair position in bed today. Worked on breathing techniques and BLE exercises from chair position in bed. Will continue to follow. Once medically stable and tolerating more mobility, recommending intensive inpatient rehab >3 hours.       If plan is discharge home, recommend the following: Two people to help with walking and/or transfers;Two people to help with bathing/dressing/bathroom;Assistance with cooking/housework;Direct supervision/assist for medications management;Direct supervision/assist for financial management;Assist for transportation;Help with stairs or ramp for entrance;Assistance with feeding    Functional Status Assessment  Patient has had a recent decline in their functional status and demonstrates the ability to make significant improvements in function in a reasonable and predictable amount of time.  Equipment Recommendations  Other (comment) (defer)    Recommendations for Other Services       Precautions / Restrictions Precautions Precautions:  Fall;Sternal Precaution Booklet Issued: No Precaution Comments: reviewed precautions; Impella; Swan-Ganz; A-line; temp pacer Restrictions Weight Bearing Restrictions Per Provider Order: Yes (sternal precautions) RUE Weight Bearing Per Provider Order: Non weight bearing LUE Weight Bearing Per Provider Order: Non weight bearing Other Position/Activity Restrictions: sternal precautions      Mobility Bed Mobility Overal bed mobility: Needs Assistance             General bed mobility comments: total A with bed egress.    Transfers                   General transfer comment: MD cleared for sitting only this session      Balance Overall balance assessment: Needs assistance   Sitting balance-Leahy Scale: Poor Sitting balance - Comments: reliant on back support from bed this session                                   ADL either performed or assessed with clinical judgement   ADL Overall ADL's : Needs assistance/impaired     Grooming: Oral care;Sitting;Maximal assistance Grooming Details (indicate cue type and reason): using RUE with support at elbow and initiatlly assist to move hand in functional pattern                                     Vision   Additional Comments: Will continue to assess; cues to open eyes this session; does automatically scan     Perception Perception: Not tested       Praxis Praxis: Not tested       Pertinent Vitals/Pain Pain Assessment Pain Assessment:  Faces Faces Pain Scale: Hurts little more Pain Location: generalized Pain Descriptors / Indicators: Discomfort, Grimacing, Operative site guarding Pain Intervention(s): Limited activity within patient's tolerance, Monitored during session     Extremity/Trunk Assessment Upper Extremity Assessment Upper Extremity Assessment: Generalized weakness;Right hand dominant (able to give thumbs up and number 1 on R side)   Lower Extremity Assessment Lower  Extremity Assessment: Generalized weakness   Cervical / Trunk Assessment Cervical / Trunk Assessment: Kyphotic   Communication Communication Communication: No apparent difficulties   Cognition Arousal: Alert Behavior During Therapy: Flat affect Overall Cognitive Status: Impaired/Different from baseline Area of Impairment: Attention, Memory, Safety/judgement, Following commands, Awareness, Problem solving                   Current Attention Level: Focused, Sustained Memory: Decreased short-term memory, Decreased recall of precautions Following Commands: Follows one step commands with increased time, Follows one step commands inconsistently Safety/Judgement: Decreased awareness of safety, Decreased awareness of deficits Awareness: Intellectual Problem Solving: Slow processing, Decreased initiation, Difficulty sequencing, Requires verbal cues, Requires tactile cues General Comments: Pt lethargic needing constant cues to maintain arousal with eyes open. Was oriented to location and month but not year. Pt received sedative medications inducing drowsiness this weekend several times and last dose last night which RN believes contributing. Needing one step direct verbal cues this session to follow commands. Able to sistain task for short periods     General Comments  HR 90s. MAP iniaitlly 70s and 59 with sitting extended period of time in chair poisition in bed ~15 mins    Exercises     Shoulder Instructions      Home Living Family/patient expects to be discharged to:: Private residence Living Arrangements: Alone Available Help at Discharge: Family;Available 24 hours/day Type of Home: Apartment Home Access: Stairs to enter Entrance Stairs-Number of Steps: 3 Entrance Stairs-Rails: Right Home Layout: One level               Home Equipment: None   Additional Comments: Per chart review, lives alone; sister and brother-in-law (in good shape) live in [place], state they're  retired and plan to stay with pt for initial 24/7 assist at d/c      Prior Functioning/Environment Prior Level of Function : Independent/Modified Independent;Driving             Mobility Comments: independent without DME. drives. enjoys going to [employer] to socialize in the morning ADLs Comments: Per chart independent; pt with limited verbalizations this session        OT Problem List: Decreased strength;Impaired balance (sitting and/or standing);Decreased activity tolerance;Decreased cognition;Decreased safety awareness;Decreased knowledge of use of DME or AE;Cardiopulmonary status limiting activity;Impaired UE functional use      OT Treatment/Interventions: Self-care/ADL training;Therapeutic exercise;DME and/or AE instruction;Energy conservation;Balance training;Patient/family education;Therapeutic activities    OT Goals(Current goals can be found in the care plan section) Acute Rehab OT Goals Patient Stated Goal: unable OT Goal Formulation: With patient Time For Goal Achievement: 03/19/23 Potential to Achieve Goals: Good  OT Frequency: Min 1X/week    Co-evaluation              AM-PAC OT "6 Clicks" Daily Activity     Outcome Measure Help from another person eating meals?: Total Help from another person taking care of personal grooming?: A Lot Help from another person toileting, which includes using toliet, bedpan, or urinal?: Total Help from another person bathing (including washing, rinsing, drying)?: A Lot Help from another person to put on and  taking off regular upper body clothing?: A Lot Help from another person to put on and taking off regular lower body clothing?: Total 6 Click Score: 9   End of Session Nurse Communication: Mobility status  Activity Tolerance: Patient limited by lethargy Patient left: in bed;with call bell/phone within reach;with bed alarm set;with nursing/sitter in room  OT Visit Diagnosis: Unsteadiness on feet (R26.81);Muscle weakness  (generalized) (M62.81);Other symptoms and signs involving cognitive function                Time: 8119-1478 OT Time Calculation (min): 34 min Charges:  OT General Charges $OT Visit: 1 Visit OT Evaluation $OT Eval Moderate Complexity: 1 Mod OT Treatments $Self Care/Home Management : 8-22 mins  Tyler Deis, OTR/L [facility] Acute Rehabilitation Office: [phone]   Myrla Halsted 03/05/2023, 3:26 PM

## 2023-03-05 NOTE — Progress Notes (Signed)
NAME:  Walter Vargas, MRN:  [id number], DOB:  01/23/1946, LOS: 19 ADMISSION DATE:  02/14/2023, CONSULTATION DATE:  03/02/2023 REFERRING MD: Dorris Fetch - TCTS, CHIEF COMPLAINT: Postoperative management  History of Present Illness:  Encephalopathic, therefore HPI obtained from EMR review.  70 yoM with PMH significant for HTN, MR, HFrEF, and CAD admitted to [facility] 11/30 with acute on chronic HFrEF and NSTEMI.  Found to be in acute Biventricular HF with moderate to severe MR.  Treated with IV heparin, diuresed and transferred to [facility] for CABG evaluation on 12/4, admitted to HF team. Additionally tested positive for COVID 12/7, s/p completion of paxlovid with hospitalization complicated by intermittent delirium.  Underwent CABG x 5 on 12/19 by Dr. Dorris Fetch, complicated by cardiac stunning and failure to wean from bypass s/p with placement of Impella 5.5.  On return to ICU, pt coagulopathic with high CT output, VT event w/suction alarm, given multiple blood products, requiring return to OR for re-exploration without obvious source found, suspected due to coagulopathy.  PCCM consulted to assist with vent and medical management postop.   Pertinent Medical History:  HTN, MR, HFrEF, CAD  Significant Hospital Events: Including procedures, antibiotic start and stop dates in addition to other pertinent events   12/20 - PCCM consulted.  Interim History / Subjective:  Patient remains sleepy but now opens eyes with vocal stimuli Precedex was titrated off Vasopressin was titrated off, currently on norepinephrine 1 mic and milrinone at 0.3 Coox 72% Making good amount of urine Remain on Impella 5.5 at P8 with 4.5 L flow  Objective:  Blood pressure 106/78, pulse 75, temperature 99.3 F (37.4 C), temperature source Bladder, resp. rate (!) 22, height 5\' 8"  (1.727 m), weight 65.3 kg, SpO2 99%. PAP: (50-77)/(16-34) 50/16 CVP:  [0 mmHg-24 mmHg] 5 mmHg      Intake/Output Summary (Last 24 hours) at 03/05/2023  1021 Last data filed at 03/05/2023 1000 Gross per 24 hour  Intake 2594.94 ml  Output 5980 ml  Net -3385.06 ml   Filed Weights   03/03/23 0500 03/04/23 0500 03/05/23 0500  Weight: 67.3 kg 68.3 kg 65.3 kg   Physical Examination: General: Acute on chronically ill-appearing male, lying on the bed HEENT: [place]/AT, eyes anicteric.  moist mucus membranes Neuro: Lethargic, opens eyes with vocal stimuli, following simple commands but remained confused Chest: Central sternotomy incision looks clean and dry, rhonchorous breath sounds bilaterally  Heart: Regular rate and rhythm, no murmurs or gallops Abdomen: Soft, nontender, nondistended, bowel sounds present Skin: No rash   Labs and images reviewed  Resolved Hospital Problem list    Assessment & Plan:  Multivessel CAD s/p CABG x5 Acute NSTEMI Acute on chronic biventricular HFrEF with cardiogenic shock status post Impella 5.5 Expected post-operative ABLA with coagulopathy  Intraoperative TEE EF 20%, RV mildly reduced. S/p 4U PRBC, 4U FFP, 2U Cryo, 2 Plt. Still on Impella at P8 with 4.5 L flow Epinephrine and vasopressin was titrated off Remain on norepinephrine 1 mic and milrinone at 0.3 Coox is 72% Continue IV amiodarone Continue aspirin and statin Monitor H&H and transfuse  Acute respiratory failure with hypoxia Titrate oxygen with O2 sat goal 92%, currently on 4 L nasal cannula oxygen  COVID+ S/p Paxlovid treatment (end 12/12)  Delirium 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 Mental status is better today, now he is waking up Precedex was stopped Decrease Seroquel to once daily 25 mg nightly  Best Practice (right click and "Reselect all SmartList Selections" daily)   Diet/type: NPO DVT prophylaxis SCD Pressure  ulcer(s): N/A GI prophylaxis: PPI Lines: Central line, Arterial Line, and yes and it is still needed Foley:  Yes, and it is still needed Code Status:  full code Last date of multidisciplinary goals of care discussion  [per primary team]  Critical care time:     The patient is critically ill due to cardiogenic shock/acute respiratory failure/status post CABG.  Critical care was necessary to treat or prevent imminent or life-threatening deterioration.  Critical care was time spent personally by me on the following activities: development of treatment plan with patient and/or surrogate as well as nursing, discussions with consultants, evaluation of patient's response to treatment, examination of patient, obtaining history from patient or surrogate, ordering and performing treatments and interventions, ordering and review of laboratory studies, ordering and review of radiographic studies, pulse oximetry, re-evaluation of patient's condition and participation in multidisciplinary rounds.   During this encounter critical care time was devoted to patient care services described in this note for 34 minutes.     Cheri Fenstermaker, MD [place] [facility] See Amion for pager If no response to pager, please call [phone] until 7pm After 7pm, Please call E-link [phone]   Please see [email or web address] for pager details.  From 7A-7P if no response, please call [phone] After hours, please call ELink [phone]

## 2023-03-05 NOTE — Progress Notes (Signed)
Inpatient Rehab Admissions Coordinator:   Met with patient at bedside.  Lethargic, does not open eyes but does shake/nod head to yes/no questions.  Discussed with RN.  Still with impella, NE drip, and lasix gtt.  Not medically ready to consider CIR yet, but we will follow.  I will call his sister to discuss discharge plan today or tomorrow.   Estill Dooms, PT, DPT Admissions Coordinator [phone] 03/05/23  2:15 PM

## 2023-03-05 NOTE — Progress Notes (Signed)
4 Days Post-Op Procedure(s) (LRB): EXPLORATION POST OPERATIVE OPEN HEART (N/A) Subjective: somnolent  Objective: Vital signs in last 24 hours: Temp:  [97.3 F (36.3 C)-99.5 F (37.5 C)] 99.5 F (37.5 C) (12/23 0400) Pulse Rate:  [68-89] 78 (12/23 0730) Cardiac Rhythm: Normal sinus rhythm;Junctional rhythm (12/22 2000) Resp:  [12-24] 19 (12/23 0730) SpO2:  [92 %-100 %] 94 % (12/23 0730) Arterial Line BP: (90-126)/(61-99) 117/74 (12/23 0730) Weight:  [65.3 kg] 65.3 kg (12/23 0500)  Hemodynamic parameters for last 24 hours: PAP: (47-77)/(15-34) 50/16 CVP:  [0 mmHg-24 mmHg] 3 mmHg CO:  [3.8 L/min] 3.8 L/min CI:  [2.3 L/min/m2] 2.3 L/min/m2  Intake/Output from previous day: 12/22 0701 - 12/23 0700 In: 3145.4 [I.V.:1207.9; Blood:375; NG/GT:1210; IV Piggyback:50] Out: 4910 [Urine:4910] Intake/Output this shift: No intake/output data recorded.  General appearance: cooperative Neurologic: no focal deficit Heart: regular rate and rhythm and impella hum Lungs: rhonchi bilaterally and L>R Abdomen: normal findings: soft, non-tender  Lab Results: Recent Labs    03/04/23 1352 03/05/23 0313  WBC 9.3 8.3  HGB 8.9* 9.9*  HCT 25.9* 29.3*  PLT 63* 79*   BMET:  Recent Labs    03/04/23 1352 03/05/23 0313  NA 124* 134*  K 3.8 4.2  CL 93* 102  CO2 22 23  GLUCOSE 132* 118*  BUN 19 14  CREATININE 0.61 0.67  CALCIUM 7.5* 8.1*    PT/INR:  Recent Labs    03/03/23 0314  LABPROT 19.4*  INR 1.6*   ABG    Component Value Date/Time   PHART 7.420 03/02/2023 1552   HCO3 22.7 03/02/2023 1552   TCO2 24 03/02/2023 1552   ACIDBASEDEF 1.0 03/02/2023 1552   O2SAT 97 03/02/2023 1552   CBG (last 3)  Recent Labs    03/04/23 2335 03/05/23 0314 03/05/23 0727  GLUCAP 102* 117* 98    Assessment/Plan: S/P Procedure(s) (LRB): EXPLORATION POST OPERATIVE OPEN HEART (N/A) - NEURO- over sedated at present, hold Seroquel CV- in SR. Impella p8 with 4.6 L/min flow, CVP 2  Off  vasopressin, norepi @ 2, milrinone 0.3  Will discuss possible Impella wean with AHF  In Sr with occasional paced beats on amiodarone RESP- small to moderate effusions bilaterally  Continue pulmonary hygiene RENAL- creatinine and lytes OK  Diuresing well ENDO- CBG well controlled Gi- tolerating TF Anemia Hgb 9.9, monitor Thrombocytopenia- PLT up to 79K   LOS: 19 days    [name] 03/05/2023

## 2023-03-06 ENCOUNTER — Inpatient Hospital Stay (HOSPITAL_COMMUNITY): Payer: 59

## 2023-03-06 LAB — BASIC METABOLIC PANEL
Anion gap: 9 (ref 5–15)
Anion gap: 8 (ref 5–15)
Anion gap: 6 (ref 5–15)
BUN: 14 mg/dL (ref 8–23)
BUN: 18 mg/dL (ref 8–23)
BUN: 17 mg/dL (ref 8–23)
CO2: 24 mmol/L (ref 22–32)
CO2: 25 mmol/L (ref 22–32)
CO2: 27 mmol/L (ref 22–32)
Calcium: 7.7 mg/dL — ABNORMAL LOW (ref 8.9–10.3)
Calcium: 7.9 mg/dL — ABNORMAL LOW (ref 8.9–10.3)
Calcium: 7.7 mg/dL — ABNORMAL LOW (ref 8.9–10.3)
Chloride: 99 mmol/L (ref 98–111)
Chloride: 99 mmol/L (ref 98–111)
Chloride: 98 mmol/L (ref 98–111)
Creatinine, Ser: 0.65 mg/dL (ref 0.61–1.24)
Creatinine, Ser: 0.62 mg/dL (ref 0.61–1.24)
Creatinine, Ser: 0.64 mg/dL (ref 0.61–1.24)
GFR, Estimated: >60 mL/min (ref >60)
GFR, Estimated: >60 mL/min (ref >60)
GFR, Estimated: >60 mL/min (ref >60)
Glucose, Bld: 128 mg/dL — ABNORMAL HIGH (ref 70–99)
Glucose, Bld: 83 mg/dL (ref 70–99)
Glucose, Bld: 117 mg/dL — ABNORMAL HIGH (ref 70–99)
Potassium: 3.7 mmol/L (ref 3.5–5.1)
Potassium: 3.7 mmol/L (ref 3.5–5.1)
Potassium: 4 mmol/L (ref 3.5–5.1)
Sodium: 132 mmol/L — ABNORMAL LOW (ref 135–145)
Sodium: 132 mmol/L — ABNORMAL LOW (ref 135–145)
Sodium: 131 mmol/L — ABNORMAL LOW (ref 135–145)

## 2023-03-06 LAB — SURGICAL PATHOLOGY

## 2023-03-06 LAB — COOXEMETRY PANEL
Carboxyhemoglobin: 2.1 % — ABNORMAL HIGH (ref 0.5–1.5)
Methemoglobin: <0.7 % (ref 0.0–1.5)
O2 Saturation: 68.7 %
Total hemoglobin: 10.8 g/dL — ABNORMAL LOW (ref 12.0–16.0)

## 2023-03-06 LAB — CBC
HCT: 30.5 % — ABNORMAL LOW (ref 39.0–52.0)
Hemoglobin: 10.2 g/dL — ABNORMAL LOW (ref 13.0–17.0)
MCH: 29.6 pg (ref 26.0–34.0)
MCHC: 33.4 g/dL (ref 30.0–36.0)
MCV: 88.4 fL (ref 80.0–100.0)
Platelets: 89 10*3/uL — ABNORMAL LOW (ref 150–400)
RBC: 3.45 MIL/uL — ABNORMAL LOW (ref 4.22–5.81)
RDW: 16.7 % — ABNORMAL HIGH (ref 11.5–15.5)
WBC: 7.2 10*3/uL (ref 4.0–10.5)
nRBC: 0 % (ref 0.0–0.2)

## 2023-03-06 LAB — GLUCOSE, CAPILLARY
Glucose-Capillary: 118 mg/dL — ABNORMAL HIGH (ref 70–99)
Glucose-Capillary: 112 mg/dL — ABNORMAL HIGH (ref 70–99)
Glucose-Capillary: 121 mg/dL — ABNORMAL HIGH (ref 70–99)
Glucose-Capillary: 121 mg/dL — ABNORMAL HIGH (ref 70–99)
Glucose-Capillary: 128 mg/dL — ABNORMAL HIGH (ref 70–99)

## 2023-03-06 LAB — MAGNESIUM
Magnesium: 2.1 mg/dL (ref 1.7–2.4)
Magnesium: 1.6 mg/dL — ABNORMAL LOW (ref 1.7–2.4)

## 2023-03-06 LAB — PHOSPHORUS: Phosphorus: 2.5 mg/dL (ref 2.5–4.6)

## 2023-03-06 MED ORDER — POTASSIUM CHLORIDE 10 MEQ/50ML IV SOLN
10.0000 meq | INTRAVENOUS | Status: AC
  Administered 2023-03-06 (×3): 10 meq via INTRAVENOUS
  Filled 2023-03-06: qty 50

## 2023-03-06 MED ORDER — MAGNESIUM SULFATE 2 GM/50ML IV SOLN
2.0000 g | Freq: Once | INTRAVENOUS | Status: AC
  Administered 2023-03-06: 2 g via INTRAVENOUS
  Filled 2023-03-06: qty 50

## 2023-03-06 MED ORDER — MAGNESIUM SULFATE 2 GM/50ML IV SOLN
2.0000 g | Freq: Once | INTRAVENOUS | Status: AC
  Administered 2023-03-07: 2 g via INTRAVENOUS
  Filled 2023-03-06: qty 50

## 2023-03-06 NOTE — Progress Notes (Signed)
NAME:  Walter Vargas, MRN:  [id number], DOB:  Nov 20, 1945, LOS: 20 ADMISSION DATE:  02/14/2023, CONSULTATION DATE:  03/02/2023 REFERRING MD: Dorris Fetch - TCTS, CHIEF COMPLAINT: Postoperative management  History of Present Illness:  Encephalopathic, therefore HPI obtained from EMR review.  32 yoM with PMH significant for HTN, MR, HFrEF, and CAD admitted to [facility] 11/30 with acute on chronic HFrEF and NSTEMI.  Found to be in acute Biventricular HF with moderate to severe MR.  Treated with IV heparin, diuresed and transferred to [facility] for CABG evaluation on 12/4, admitted to HF team. Additionally tested positive for COVID 12/7, s/p completion of paxlovid with hospitalization complicated by intermittent delirium.  Underwent CABG x 5 on 12/19 by Dr. Dorris Fetch, complicated by cardiac stunning and failure to wean from bypass s/p with placement of Impella 5.5.  On return to ICU, pt coagulopathic with high CT output, VT event w/suction alarm, given multiple blood products, requiring return to OR for re-exploration without obvious source found, suspected due to coagulopathy.  PCCM consulted to assist with vent and medical management postop.   Pertinent Medical History:  HTN, MR, HFrEF, CAD  Significant Hospital Events: Including procedures, antibiotic start and stop dates in addition to other pertinent events   12/20 - PCCM consulted.  Interim History / Subjective:  Patient is much more awake and appropriate today, though he was agitated overnight Remain on low-dose norepinephrine Impella is at P8 with 4.6 L flow Coox 69%   Objective:  Blood pressure 106/78, pulse 89, temperature 98.2 F (36.8 C), temperature source Axillary, resp. rate 20, height 5\' 8"  (1.727 m), weight 64.9 kg, SpO2 95%. CVP:  [0 mmHg-6 mmHg] 2 mmHg      Intake/Output Summary (Last 24 hours) at 03/06/2023 8295 Last data filed at 03/06/2023 0700 Gross per 24 hour  Intake 3209.12 ml  Output 5155 ml  Net -1945.88 ml   Filed  Weights   03/05/23 0500 03/05/23 1037 03/06/23 0406  Weight: 65.3 kg 65.3 kg 64.9 kg   Physical Examination: General: Acute on chronically ill-appearing elderly male, lying on the bed HEENT: [place]/AT, eyes anicteric.  moist mucus membranes Neuro: Alert, awake following commands Chest: Central sternotomy looks clean and dry coarse breath sounds, no wheezes or rhonchi Heart: Regular rate and rhythm, no murmurs or gallops Abdomen: Soft, nontender, nondistended, bowel sounds present Skin: No rash   Labs and images reviewed  Resolved Hospital Problem list    Assessment & Plan:  Multivessel CAD s/p CABG x5 Acute NSTEMI Acute on chronic biventricular HFrEF with cardiogenic shock status post Impella 5.5 Expected post-operative ABLA with coagulopathy  Intraoperative TEE EF 20%, RV mildly reduced. S/p 4U PRBC, 4U FFP, 2U Cryo, 2 Plt. Remain on Impella at P8 with 4.6 L flow Remain on norepinephrine 2 mic and milrinone at 0.3 Coox is 69% Continue diuresis Continue IV amiodarone Continue aspirin and statin  Acute respiratory failure with hypoxia Continue to titrate oxygen with O2 sat goal 92%  Hypovolemic hyponatremia Closely monitor electrolytes and supplement  COVID+ S/p Paxlovid treatment (end 12/12)  Delirium 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 Mental status is better this morning, he was agitated overnight Precedex is off now Continue Seroquel 25 mg nightly  Best Practice (right click and "Reselect all SmartList Selections" daily)   Diet/type: Tube feeds DVT prophylaxis SCD Pressure ulcer(s): N/A GI prophylaxis: PPI Lines: Central line, Arterial Line, and yes and it is still needed Foley:  Yes, and it is still needed Code Status:  full code Last date  of multidisciplinary goals of care discussion [per primary team]  Critical care time:     The patient is critically ill due to cardiogenic shock/acute respiratory failure/status post CABG.  Critical care was necessary to treat  or prevent imminent or life-threatening deterioration.  Critical care was time spent personally by me on the following activities: development of treatment plan with patient and/or surrogate as well as nursing, discussions with consultants, evaluation of patient's response to treatment, examination of patient, obtaining history from patient or surrogate, ordering and performing treatments and interventions, ordering and review of laboratory studies, ordering and review of radiographic studies, pulse oximetry, re-evaluation of patient's condition and participation in multidisciplinary rounds.   During this encounter critical care time was devoted to patient care services described in this note for 32 minutes.     Cheri Wallman, MD  Pulmonary Critical Care See Amion for pager If no response to pager, please call [phone] until 7pm After 7pm, Please call E-link [phone]

## 2023-03-06 NOTE — Progress Notes (Signed)
5 Days Post-Op Procedure(s) (LRB): EXPLORATION POST OPERATIVE OPEN HEART (N/A) Subjective: Calm this AM after being agitated over night requiring restraints  Objective: Vital signs in last 24 hours: Temp:  [98.6 F (37 C)-99.6 F (37.6 C)] 99.1 F (37.3 C) (12/24 0400) Pulse Rate:  [63-126] 89 (12/24 0700) Cardiac Rhythm: Normal sinus rhythm (12/24 0000) Resp:  [15-27] 20 (12/24 0700) SpO2:  [78 %-100 %] 95 % (12/24 0700) Arterial Line BP: (81-135)/(53-87) 101/64 (12/24 0700) Weight:  [64.9 kg-65.3 kg] 64.9 kg (12/24 0406)  Hemodynamic parameters for last 24 hours: CVP:  [0 mmHg-6 mmHg] 2 mmHg  Intake/Output from previous day: 12/23 0701 - 12/24 0700 In: 3319.3 [I.V.:803.1; NG/GT:1482.7; IV Piggyback:750.8] Out: 5555 [Urine:5385; Stool:170] Intake/Output this shift: No intake/output data recorded.  General appearance: alert, cooperative, and no distress Neurologic: no focal weakness Heart: regular rate and rhythm and impella hum Lungs: diminished breath sounds bibasilar  Lab Results: Recent Labs    03/05/23 1650 03/06/23 0338  WBC 8.0 7.2  HGB 10.6* 10.2*  HCT 31.6* 30.5*  PLT 81* 89*   BMET:  Recent Labs    03/05/23 2126 03/06/23 0338  NA 135 132*  K 3.8 3.7  CL 100 99  CO2 24 24  GLUCOSE 115* 128*  BUN 16 14  CREATININE 0.58* 0.65  CALCIUM 8.0* 7.7*    PT/INR: No results for input(s): "LABPROT", "INR" in the last 72 hours. ABG    Component Value Date/Time   PHART 7.481 (H) 03/05/2023 1355   HCO3 26.2 03/05/2023 1355   TCO2 27 03/05/2023 1355   ACIDBASEDEF 1.0 03/02/2023 1552   O2SAT 96 03/05/2023 1355   CBG (last 3)  Recent Labs    03/05/23 1933 03/05/23 2125 03/06/23 0341  GLUCAP 106* 114* 118*    Assessment/Plan: S/P Procedure(s) (LRB): EXPLORATION POST OPERATIVE OPEN HEART (N/A) POD # 5 NEURO- no focal deficit, delirium in setting of dementia continues to be problematic CV- severe ischemic cardiomyopathy  Impella p8 4.5 l/min flow  CVP 2  On milrinone 0.3, norepi 6  Will d/w AHF team timing of Impella wean RESP- sat ok on 2L [place]  + R effusion RENAL- creatinine normal, mild hyponatremia  4 L negative over past 48 hours with no change in weight\  Continue IV Lasix ENDO- CBG well controlled GI- on goal TF Thrombocytopenia- PLT up slightly to 89K  LOS: 20 days    [name] 03/06/2023

## 2023-03-06 NOTE — Progress Notes (Signed)
Physical Therapy Treatment Patient Details Name: Walter Vargas MRN: [id number] DOB: 06/13/1945 Today's Date: 03/06/2023   History of Present Illness 77 y.o. male admitted 02/13/22 with NSTEMI, CHF exacerbation. LHC with 3-vessel CAD. 12/9 Pt with fall in room. CABG delayed due to covid +. 12/19 S/p CABG x 5, TEE, and placement of impella and swan-ganz. Coagulopathic on return from OR, high chest tube output, required multiple blood products. 12/20 Back to OR for re-exploratation, bleeding from EPW. ETT 12/19 - 12/20. Chest tubes 12/19 - 12/21. Cortrak placed 12/20. PMHx: HTN, HF, mod/severe MR.    PT Comments  Pt pleasant, remains confused but able to state name and initiate following commands to stand and ambulate. Impella 5.5 at p-8 with RN present and assisting with lines throughout. Pt educated for sternal precautions, transfers, progression and safety. Will continue to follow to maximize mobility and safety.  HR 93 BP 126/80 97% on 4L    If plan is discharge home, recommend the following: Assistance with cooking/housework;Direct supervision/assist for medications management;Direct supervision/assist for financial management;Assist for transportation;Supervision due to cognitive status;A lot of help with walking and/or transfers;A lot of help with bathing/dressing/bathroom;Help with stairs or ramp for entrance   Can travel by private vehicle        Equipment Recommendations  BSC/3in1    Recommendations for Other Services Rehab consult     Precautions / Restrictions Precautions Precautions: Fall;Sternal;Other (comment) Precaution Comments: Impella;  A-line; temp pacer, flexiseal     Mobility  Bed Mobility Overal bed mobility: Needs Assistance Bed Mobility: Rolling, Sidelying to Sit Rolling: Min assist Sidelying to sit: Min assist       General bed mobility comments: cues for sequence with assist to roll to right and rise from side with assist to clear legs and lift trunk.  mod assist to scoot to EOB    Transfers Overall transfer level: Needs assistance   Transfers: Sit to/from Stand Sit to Stand: Min assist, +2 safety/equipment           General transfer comment: min assist to rise from elevated bed with cues for hand placement, safety and sequence. +2 assist for lines and safety    Ambulation/Gait Ambulation/Gait assistance: Mod assist, +2 safety/equipment Gait Distance (Feet): 120 Feet Assistive device: Rolling walker (2 wheels) Gait Pattern/deviations: Step-through pattern, Decreased stride length, Leaning posteriorly, Narrow base of support   Gait velocity interpretation: <1.8 ft/sec, indicate of risk for recurrent falls   General Gait Details: pt with narrow BOS, heels nearly touching, posterior bias and mod cues for safety, sequence, RW control and balance. +3 additional assist to manage 2 iv poles, impella and chair follow. pt able to self direct distance   Optometrist     Tilt Bed    Modified Rankin (Stroke Patients Only)       Balance Overall balance assessment: Needs assistance Sitting-balance support: No upper extremity supported, Feet supported Sitting balance-Leahy Scale: Poor Sitting balance - Comments: posterior bias in sitting with CGA-min assist   Standing balance support: Bilateral upper extremity supported, Reliant on assistive device for balance Standing balance-Leahy Scale: Poor Standing balance comment: mod assist for standing balance with reliance on RW and physical support due to posterior bias                            Cognition Arousal: Alert Behavior During Therapy: Flat affect  Overall Cognitive Status: Impaired/Different from baseline Area of Impairment: Attention, Memory, Safety/judgement, Following commands, Awareness, Problem solving                   Current Attention Level: Focused Memory: Decreased short-term memory, Decreased recall of  precautions Following Commands: Follows one step commands with increased time, Follows one step commands inconsistently Safety/Judgement: Decreased awareness of safety, Decreased awareness of deficits   Problem Solving: Slow processing, Decreased initiation, Difficulty sequencing, Requires verbal cues, Requires tactile cues General Comments: pt oriented to place and self not time or precautions. educated for sternal precautions        Exercises      General Comments        Pertinent Vitals/Pain Pain Assessment Pain Assessment: No/denies pain    Home Living                          Prior Function            PT Goals (current goals can now be found in the care plan section) Progress towards PT goals: Progressing toward goals    Frequency    Min 1X/week      PT Plan      Co-evaluation              AM-PAC PT "6 Clicks" Mobility   Outcome Measure  Help needed turning from your back to your side while in a flat bed without using bedrails?: A Little Help needed moving from lying on your back to sitting on the side of a flat bed without using bedrails?: A Little Help needed moving to and from a bed to a chair (including a wheelchair)?: A Lot Help needed standing up from a chair using your arms (e.g., wheelchair or bedside chair)?: Total Help needed to walk in hospital room?: Total Help needed climbing 3-5 steps with a railing? : Total 6 Click Score: 11    End of Session Equipment Utilized During Treatment: Oxygen;Gait belt Activity Tolerance: Patient tolerated treatment well Patient left: in chair;with call bell/phone within reach;with chair alarm set;with nursing/sitter in room Nurse Communication: Mobility status;Precautions PT Visit Diagnosis: Other abnormalities of gait and mobility (R26.89);Unsteadiness on feet (R26.81);Muscle weakness (generalized) (M62.81);Difficulty in walking, not elsewhere classified (R26.2)     Time: 6195-0932 PT Time  Calculation (min) (ACUTE ONLY): 32 min  Charges:    $Gait Training: 8-22 mins $Therapeutic Activity: 8-22 mins PT General Charges $$ ACUTE PT VISIT: 1 Visit                     Merryl Hacker, PT Acute Rehabilitation Services Office: [phone]    Delailah Spieth B Leroy Pettway 03/06/2023, 10:30 AM

## 2023-03-06 NOTE — Consult Note (Signed)
WOC Nurse Consult Note: Reason for Consult: Requested to assess a skin breakdown Wound type: Bruise blisters on the skin. 1 - right arm  Measurement: 1.5x1.5x.1cm Wound bed: 100% red, hematoma. Drainage (amount, consistency, odor) Low amount, clots. Periwound:disrupt blister. Dressing procedure/placement/frequency: Apply xeroform (change daily), cover with foam dressing (change 3Q). Important to continue with non adherent dressing. Conservative wound debridement with gauze at the bedside. Sent it a secure chat to the doctor assess the pt, regarding to the suspicious causes of the hematomas related to a anticoagulant medication.  WOC team will not plan to follow further.  Please reconsult if further assistance is needed. Thank-you,  Denyse Amass BSN, RN, [employer], WOC  (Pager: [phone])

## 2023-03-06 NOTE — Progress Notes (Addendum)
Advanced Heart Failure Rounding Note  PCP-Cardiologist: None  Chief Complaint: Cardiogenic Shock  Subjective:    12/19: S/p CABG and Impella 5.5 Insertion. Coagulopathic on return from OR, high chest tube output, required multiple blood products. Subsequent VT associated w/ Impella suction alarms. Back to OR for re-exploratation, bleeding from EPW.   12/23: Bedside echo severe biventricular dysfunction. LVEF 25% RV moderately down. Impella well positioned  POD# 5  More alert/ awake this morning. Conversant. Mood appropriate.  No distress. Denies CP. No dyspnea.   Hgb improved/ stable at 10.2. Plts trending up, 89K today.   Impella at P-8, Flow 4.6L.   On NE 4 + Milrinone 0.3. Co-ox 69%.   Diuresing well on lasix gtt at 4/hr. 5.4L in UOP yesterday.   NSR on tele. Amio gtt @ 30  Objective:   Weight Range: 64.9 kg Body mass index is 21.76 kg/m.   Vital Signs:   Temp:  [98.2 F (36.8 C)-99.5 F (37.5 C)] 99.5 F (37.5 C) (12/24 0800) Pulse Rate:  [63-126] 89 (12/24 0845) Resp:  [15-27] 21 (12/24 0845) SpO2:  [78 %-100 %] 96 % (12/24 0845) Arterial Line BP: (81-135)/(53-87) 111/66 (12/24 0845) Weight:  [64.9 kg-65.3 kg] 64.9 kg (12/24 0406) Last BM Date : 03/05/23  Weight change: Filed Weights   03/05/23 0500 03/05/23 1037 03/06/23 0406  Weight: 65.3 kg 65.3 kg 64.9 kg   Intake/Output:   Intake/Output Summary (Last 24 hours) at 03/06/2023 0907 Last data filed at 03/06/2023 0800 Gross per 24 hour  Intake 3322.6 ml  Output 5080 ml  Net -1757.4 ml    Physical Exam   General:  thin, elderly appearing. No respiratory difficulty HEENT: normal + cor trak  Neck: supple. JVD 8 cm Carotids 2+ bilat; no bruits. No lymphadenopathy or thyromegaly appreciated. Cor: PMI nondisplaced. Regular rate & rhythm. No rubs, gallops or murmurs. +lt subclavian Impella  Lungs: clear Abdomen: soft, nontender, nondistended. No hepatosplenomegaly. No bruits or masses. Good bowel  sounds. Extremities: no cyanosis, clubbing, rash, edema Neuro: alert & oriented x 3, cranial nerves grossly intact. moves all 4 extremities w/o difficulty. Affect pleasant. GU:+ foley    Telemetry   NSR 70s   Patient Profile   Mr Phinney is a 77 y.o. history of HTN, MR, HFrEF, and severe 3V CAD.  S/p CABG and Impella 5.5 Insertion 12/19 w D.r Dorris Fetch.   Assessment/Plan   Cardiogenic Postoperative Shock - Biventricular failure at b/l. Pre CABG echo EF < 20%, RV moderately reduced (PAPi was adequate on RHC) - Intra-op course complicated by cardiac stun and failure to wean from bypass. Impella 5.5 placed  - intra op TEE EF 20%, RV mildly reduced  - c/w Impella 5.5 @ P8. Flow 4.6LPM - on NE 3 + Milrinone 0.3. Ernestine Conrad is out.  Co-ox 69%  - Remains volume up. Responding well w/ IV lasix w/ stable SCr. Continue  lasix gtt at 4/hr   - continue current Impella and milrinone support support . Wean NE as tolerated   2. CAD: Patient admitted with NSTEMI (?culprit D2) but with severe extensive diffuse 3 vessel disease.  Disease too extensive to treat percutaneously.  Anatomy felt best served by CABG. - s/p CABG and Impella 5.5 insertion w Dr. Dorris Fetch 12/19. - Continue ASA 81  - Continue atorva 80 - no ? blocker w/ shock   3. Acute on chronic systolic CHF: Patient has had HF symptoms for at least a couple of months.  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 RHC 12/3 showed normal filling pressures and low CI at 2.0. Echo 12/4: EF 20%, RV mild-moderately dysfunctional, mild-moderate MR.  - in CGS as above - continue Impella and milrinone support until fully diuresed - continue lasix gtt at 4/hr  - Hold GDMT with impella, can ambulate - Follow renal function/ UOP   4. Post-Operative Bleeding/ ABLA -coagulopathic on return from OR - Supported with multiple units of blood products  including RBCs, cryo, FFP and Factor VII + albumin and protamine, had to return to the OR. Bleeding noted around chest tubes - now stable. CTs pulled - Hgb stable at 10.6 today  - continue to monitor closely   5. 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  Patient has had on and off confusion in hospital.  - much improved today  - appreciate CCM management  6. Gallbladder disease: Patient had gallstones but HIDA scan not suggestive of cholecystitis.  7. COVID+: Completed Paxlovid  8. Fall: Had inpatient fall. No injuries. Did not hit his head.   9. 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  10. Acute Hypoxic Respiratory Failure - now extubated  - continue diuresis as above      Length of Stay: [address], PA-C  03/06/2023, 9:07 AM  Advanced Heart Failure Team Pager [phone] (M-F; 7a - 5p)  Please contact [facility] for night-coverage after hours (5p -7a ) and weekends on [email or web address]   Agree with above.   Remains on NE 3, milrinone 0.3 and Impella 5.5 @ P-8 (4.5 L flow)  More alert today. Diuresing well with IV lasix Renal function stable. Remains in sinus  General:  Weak appearing. No resp difficulty HEENT: normal Neck: supple. no JVD. Carotids 2+ bilat; no bruits. No lymphadenopathy or thryomegaly appreciated. Cor: Sternal wound ok. Impella site ok  Regular rate & rhythm. No rubs, gallops or murmurs. Lungs: coarse Abdomen: soft, nontender, nondistended. No hepatosplenomegaly. No bruits or masses. Good bowel sounds. Extremities: no cyanosis, clubbing, rash,1-2+ edema Neuro: alert & orientedx3, cranial nerves grossly intact. moves all 4 extremities w/o difficulty. Affect pleasant  Improving steadily. Continue IV diuresis with Impella and milrinone support. Once volume status optimized will wean Impella (Thurs/Fri?). D/w Dr.  Dorris Fetch.   Mobilize with PT today.   I did bedside echo and EF still 25% with moderate RV dysfunction. Impeall well positioned.   CRITICAL CARE Performed by: Arvilla Meres  Total critical care time: 50 minutes  Critical care time was exclusive of separately billable procedures and treating other patients.  Critical care was necessary to treat or prevent imminent or life-threatening deterioration.  Critical care was time spent personally by me (independent of midlevel providers or residents) on the following activities: development of treatment plan with patient and/or surrogate as well as nursing, discussions with consultants, evaluation of patient's response to treatment, examination of patient, obtaining history from patient or surrogate, ordering and performing treatments and interventions, ordering and review of laboratory studies, ordering and review of radiographic studies, pulse oximetry and re-evaluation of patient's condition.  Arvilla Meres, MD  5:11 PM

## 2023-03-06 NOTE — Progress Notes (Signed)
Inpatient Rehab Coordinator Note:  I spoke with patient's son, [name], over the phone to discuss CIR recommendations and goals/expectations of CIR stay.  We reviewed 3 hrs/day of therapy, physician follow up, and average length of stay 2 weeks (dependent upon progress) with goals of supervision.  We discussed pt's current medical status and barriers to CIR at this time (still with impella, on NE drip, amio gtt, and lasix gtt).  Family would need to coordinate 24/7 supervision from multiple family members, none of whom are local.  We discussed in depth regarding goal if admitted to CIR would be to d/c home with 24/7 supervision, not d/c to a SNF.  We discussed would need insurance auth if the time comes.  I will f/u with him on Thursday.   Estill Dooms, PT, DPT Admissions Coordinator [phone] 03/06/23  2:59 PM

## 2023-03-06 NOTE — Progress Notes (Signed)
Occupational Therapy Treatment Patient Details Name: Walter Vargas MRN: [id number] DOB: 10-Feb-1946 Today's Date: 03/06/2023   History of present illness 77 y.o. male admitted 02/13/22 with NSTEMI, CHF exacerbation. LHC with 3-vessel CAD. 12/9 Pt with fall in room. CABG delayed due to covid +. 12/19 S/p CABG x 5, TEE, and placement of impella and swan-ganz. Coagulopathic on return from OR, high chest tube output, required multiple blood products. 12/20 Back to OR for re-exploratation, bleeding from EPW. ETT 12/19 - 12/20. Chest tubes 12/19 - 12/21. Cortrak placed 12/20. PMHx: HTN, HF, mod/severe MR.   OT comments  Entering session at request of RN to assist with transfer back to bed. Pt unable to recall any sternal precautions and max multimodal cues to implement functionally. Pt needing mod A +2 for STS and steps toward bed with continued significant cueing. Total A for posterior pericare with leaky flexiseal. Will continue to follow. Recommending intensive multidisciplinary rehabilitation >3 hours/day to optimize safety and independence in ADL.        If plan is discharge home, recommend the following:  Two people to help with walking and/or transfers;Two people to help with bathing/dressing/bathroom;Assistance with cooking/housework;Direct supervision/assist for medications management;Direct supervision/assist for financial management;Assist for transportation;Help with stairs or ramp for entrance;Assistance with feeding   Equipment Recommendations  Other (comment) (defer)    Recommendations for Other Services      Precautions / Restrictions Precautions Precautions: Fall;Sternal;Other (comment) Precaution Booklet Issued: No Precaution Comments: Impella;  A-line; temp pacer, flexiseal Restrictions Weight Bearing Restrictions Per Provider Order: Yes RUE Weight Bearing Per Provider Order: Non weight bearing LUE Weight Bearing Per Provider Order: Non weight bearing Other Position/Activity  Restrictions: sternal precautions       Mobility Bed Mobility Overal bed mobility: Needs Assistance Bed Mobility: Rolling, Sit to Sidelying Rolling: Min assist       Sit to sidelying: Max assist, +2 for physical assistance, +2 for safety/equipment General bed mobility comments: max cues for sequencing and hand plancement    Transfers Overall transfer level: Needs assistance Equipment used: Rolling walker (2 wheels) Transfers: Sit to/from Stand Sit to Stand: +2 safety/equipment, Mod assist           General transfer comment: heavy retropulsion in standing. Max multimodal cues and decreasing environmental distraction to follow commands.     Balance Overall balance assessment: Needs assistance Sitting-balance support: No upper extremity supported, Feet supported Sitting balance-Leahy Scale: Poor     Standing balance support: Bilateral upper extremity supported, Reliant on assistive device for balance Standing balance-Leahy Scale: Poor Standing balance comment: mod assist for standing balance with reliance on RW and physical support due to posterior bias                           ADL either performed or assessed with clinical judgement   ADL Overall ADL's : Needs assistance/impaired                         Toilet Transfer: Moderate assistance;+2 for physical assistance;+2 for safety/equipment Toilet Transfer Details (indicate cue type and reason): Poor command following throughout transfer and signifciant retropulsion in standing requiring constant mod A Toileting- Clothing Manipulation and Hygiene: Total assistance;+2 for physical assistance;+2 for safety/equipment Toileting - Clothing Manipulation Details (indicate cue type and reason): posterior pericare with leaky flexiseal            Extremity/Trunk Assessment Upper Extremity Assessment Upper Extremity Assessment:  Generalized weakness;Right hand dominant   Lower Extremity  Assessment Lower Extremity Assessment: Generalized weakness        Vision       Perception Perception Perception: Not tested   Praxis Praxis Praxis: Not tested    Cognition Arousal: Alert Behavior During Therapy: Flat affect, Anxious Overall Cognitive Status: Impaired/Different from baseline Area of Impairment: Attention, Memory, Safety/judgement, Following commands, Awareness, Problem solving                   Current Attention Level: Focused Memory: Decreased short-term memory, Decreased recall of precautions Following Commands: Follows one step commands with increased time, Follows one step commands inconsistently Safety/Judgement: Decreased awareness of safety, Decreased awareness of deficits Awareness: Intellectual Problem Solving: Slow processing, Decreased initiation, Difficulty sequencing, Requires verbal cues, Requires tactile cues General Comments: pt oriented to place and self not time or precautions. educated for sternal precautions; poor implementation even with direct commands regarding hand palcement during transfers and bed mobility        Exercises      Shoulder Instructions       General Comments VSS. Impella 5.5 p8 on 4L O2 via [place]    Pertinent Vitals/ Pain       Pain Assessment Pain Assessment: No/denies pain  Home Living                                          Prior Functioning/Environment              Frequency  Min 1X/week        Progress Toward Goals  OT Goals(current goals can now be found in the care plan section)  Progress towards OT goals: Progressing toward goals  Acute Rehab OT Goals Patient Stated Goal: unable secondary to confusion OT Goal Formulation: With patient Time For Goal Achievement: 03/19/23 Potential to Achieve Goals: Good ADL Goals Pt Will Perform Grooming: with set-up;sitting Pt Will Perform Upper Body Dressing: with min assist;sitting Pt Will Perform Lower Body Dressing:  with mod assist;sit to/from stand Pt Will Transfer to Toilet: with mod assist;stand pivot transfer  Plan      Co-evaluation                 AM-PAC OT "6 Clicks" Daily Activity     Outcome Measure   Help from another person eating meals?: Total Help from another person taking care of personal grooming?: A Lot Help from another person toileting, which includes using toliet, bedpan, or urinal?: Total Help from another person bathing (including washing, rinsing, drying)?: A Lot Help from another person to put on and taking off regular upper body clothing?: A Lot Help from another person to put on and taking off regular lower body clothing?: Total 6 Click Score: 9    End of Session Equipment Utilized During Treatment: Gait belt;Rolling walker (2 wheels)  OT Visit Diagnosis: Unsteadiness on feet (R26.81);Muscle weakness (generalized) (M62.81);Other symptoms and signs involving cognitive function   Activity Tolerance Patient tolerated treatment well;Other (comment) (limited by confusion)   Patient Left in bed;with call bell/phone within reach;with bed alarm set;with nursing/sitter in room   Nurse Communication Mobility status        Time: 8119-1478 OT Time Calculation (min): 15 min  Charges: OT General Charges $OT Visit: 1 Visit OT Treatments $Self Care/Home Management : 8-22 mins  Myrla Halsted, OTD, OTR/L [facility] Acute  Rehabilitation Office: [phone]   Myrla Halsted 03/06/2023, 5:06 PM

## 2023-03-06 NOTE — Plan of Care (Signed)
 Problem: Health Behavior/Discharge Planning: Goal: Ability to manage health-related needs will improve Outcome: Progressing   Problem: Clinical Measurements: Goal: Ability to maintain clinical measurements within normal limits will improve Outcome: Progressing Goal: Will remain free from infection Outcome: Progressing Goal: Diagnostic test results will improve Outcome: Progressing Goal: Respiratory complications will improve Outcome: Progressing Goal: Cardiovascular complication will be avoided Outcome: Progressing   Problem: Activity: Goal: Risk for activity intolerance will decrease Outcome: Progressing   Problem: Coping: Goal: Level of anxiety will decrease Outcome: Progressing   Problem: Elimination: Goal: Will not experience complications related to bowel motility Outcome: Progressing Goal: Will not experience complications related to urinary retention Outcome: Progressing   Problem: Pain Management: Goal: General experience of comfort will improve Outcome: Progressing   Problem: Safety: Goal: Ability to remain free from injury will improve Outcome: Progressing   Problem: Skin Integrity: Goal: Risk for impaired skin integrity will decrease Outcome: Progressing   Problem: Education: Goal: Knowledge of disease or condition will improve Outcome: Progressing Goal: Understanding of medication regimen will improve Outcome: Progressing Goal: Individualized Educational Video(s) Outcome: Progressing   Problem: Activity: Goal: Ability to tolerate increased activity will improve Outcome: Progressing   Problem: Cardiac: Goal: Ability to achieve and maintain adequate cardiopulmonary perfusion will improve Outcome: Progressing   Problem: Health Behavior/Discharge Planning: Goal: Ability to safely manage health-related needs after discharge will improve Outcome: Progressing   Problem: Education: Goal: Will demonstrate proper wound care and an understanding of  methods to prevent future damage Outcome: Progressing Goal: Knowledge of disease or condition will improve Outcome: Progressing Goal: Knowledge of the prescribed therapeutic regimen will improve Outcome: Progressing Goal: Individualized Educational Video(s) Outcome: Progressing   Problem: Activity: Goal: Risk for activity intolerance will decrease Outcome: Progressing   Problem: Cardiac: Goal: Will achieve and/or maintain hemodynamic stability Outcome: Progressing   Problem: Clinical Measurements: Goal: Postoperative complications will be avoided or minimized Outcome: Progressing   Problem: Respiratory: Goal: Respiratory status will improve Outcome: Progressing   Problem: Skin Integrity: Goal: Wound healing without signs and symptoms of infection Outcome: Progressing Goal: Risk for impaired skin integrity will decrease Outcome: Progressing   Problem: Urinary Elimination: Goal: Ability to achieve and maintain adequate renal perfusion and functioning will improve Outcome: Progressing   Problem: Education: Goal: Knowledge of General Education information will improve Description: Including pain rating scale, medication(s)/side effects and non-pharmacologic comfort measures Outcome: Progressing   Problem: Health Behavior/Discharge Planning: Goal: Ability to manage health-related needs will improve Outcome: Progressing   Problem: Clinical Measurements: Goal: Ability to maintain clinical measurements within normal limits will improve Outcome: Progressing Goal: Will remain free from infection Outcome: Progressing Goal: Diagnostic test results will improve Outcome: Progressing Goal: Respiratory complications will improve Outcome: Progressing Goal: Cardiovascular complication will be avoided Outcome: Progressing   Problem: Activity: Goal: Risk for activity intolerance will decrease Outcome: Progressing   Problem: Nutrition: Goal: Adequate nutrition will be  maintained Outcome: Progressing   Problem: Coping: Goal: Level of anxiety will decrease Outcome: Progressing   Problem: Elimination: Goal: Will not experience complications related to bowel motility Outcome: Progressing Goal: Will not experience complications related to urinary retention Outcome: Progressing   Problem: Pain Management: Goal: General experience of comfort will improve Outcome: Progressing   Problem: Safety: Goal: Ability to remain free from injury will improve Outcome: Progressing   Problem: Skin Integrity: Goal: Risk for impaired skin integrity will decrease Outcome: Progressing   Problem: Education: Goal: Knowledge of risk factors and measures for prevention of condition will improve Outcome:  Progressing

## 2023-03-07 ENCOUNTER — Inpatient Hospital Stay (HOSPITAL_COMMUNITY): Payer: 59

## 2023-03-07 LAB — BASIC METABOLIC PANEL
Anion gap: 9 (ref 5–15)
Anion gap: 10 (ref 5–15)
BUN: 17 mg/dL (ref 8–23)
BUN: 20 mg/dL (ref 8–23)
CO2: 26 mmol/L (ref 22–32)
CO2: 24 mmol/L (ref 22–32)
Calcium: 7.8 mg/dL — ABNORMAL LOW (ref 8.9–10.3)
Calcium: 7.8 mg/dL — ABNORMAL LOW (ref 8.9–10.3)
Chloride: 94 mmol/L — ABNORMAL LOW (ref 98–111)
Chloride: 97 mmol/L — ABNORMAL LOW (ref 98–111)
Creatinine, Ser: 0.68 mg/dL (ref 0.61–1.24)
Creatinine, Ser: 0.66 mg/dL (ref 0.61–1.24)
GFR, Estimated: >60 mL/min (ref >60)
GFR, Estimated: >60 mL/min (ref >60)
Glucose, Bld: 148 mg/dL — ABNORMAL HIGH (ref 70–99)
Glucose, Bld: 127 mg/dL — ABNORMAL HIGH (ref 70–99)
Potassium: 3.8 mmol/L (ref 3.5–5.1)
Potassium: 4.1 mmol/L (ref 3.5–5.1)
Sodium: 129 mmol/L — ABNORMAL LOW (ref 135–145)
Sodium: 131 mmol/L — ABNORMAL LOW (ref 135–145)

## 2023-03-07 LAB — MAGNESIUM: Magnesium: 2.2 mg/dL (ref 1.7–2.4)

## 2023-03-07 LAB — CBC
HCT: 32.4 % — ABNORMAL LOW (ref 39.0–52.0)
Hemoglobin: 10.8 g/dL — ABNORMAL LOW (ref 13.0–17.0)
MCH: 29.2 pg (ref 26.0–34.0)
MCHC: 33.3 g/dL (ref 30.0–36.0)
MCV: 87.6 fL (ref 80.0–100.0)
Platelets: 116 10*3/uL — ABNORMAL LOW (ref 150–400)
RBC: 3.7 MIL/uL — ABNORMAL LOW (ref 4.22–5.81)
RDW: 16.3 % — ABNORMAL HIGH (ref 11.5–15.5)
WBC: 10.5 10*3/uL (ref 4.0–10.5)
nRBC: 0.2 % (ref 0.0–0.2)

## 2023-03-07 LAB — COOXEMETRY PANEL
Carboxyhemoglobin: 2.5 % — ABNORMAL HIGH (ref 0.5–1.5)
Methemoglobin: 0.7 % (ref 0.0–1.5)
O2 Saturation: 73.3 %
Total hemoglobin: 11.1 g/dL — ABNORMAL LOW (ref 12.0–16.0)

## 2023-03-07 LAB — GLUCOSE, CAPILLARY
Glucose-Capillary: 153 mg/dL — ABNORMAL HIGH (ref 70–99)
Glucose-Capillary: 127 mg/dL — ABNORMAL HIGH (ref 70–99)
Glucose-Capillary: 97 mg/dL (ref 70–99)
Glucose-Capillary: 116 mg/dL — ABNORMAL HIGH (ref 70–99)
Glucose-Capillary: 113 mg/dL — ABNORMAL HIGH (ref 70–99)

## 2023-03-07 LAB — PROCALCITONIN: Procalcitonin: 0.34 ng/mL

## 2023-03-07 MED ORDER — POTASSIUM CHLORIDE 20 MEQ PO PACK
20.0000 meq | PACK | ORAL | Status: AC
  Administered 2023-03-07 (×3): 20 meq
  Filled 2023-03-07 (×3): qty 1

## 2023-03-07 MED ORDER — PIPERACILLIN-TAZOBACTAM 3.375 G IVPB
3.3750 g | Freq: Three times a day (TID) | INTRAVENOUS | Status: AC
  Administered 2023-03-07 – 2023-03-12 (×15): 3.375 g via INTRAVENOUS
  Filled 2023-03-07 (×15): qty 50

## 2023-03-07 MED ORDER — DEXMEDETOMIDINE HCL IN NACL 400 MCG/100ML IV SOLN
0.0000 ug/kg/h | INTRAVENOUS | Status: DC
  Administered 2023-03-08: 0.3 ug/kg/h via INTRAVENOUS
  Filled 2023-03-07: qty 100

## 2023-03-07 MED ORDER — DEXMEDETOMIDINE HCL IN NACL 400 MCG/100ML IV SOLN
INTRAVENOUS | Status: AC
  Administered 2023-03-07: 0.2 ug/kg/h via INTRAVENOUS
  Filled 2023-03-07: qty 100

## 2023-03-07 MED ORDER — ENOXAPARIN SODIUM 40 MG/0.4ML IJ SOSY
40.0000 mg | PREFILLED_SYRINGE | INTRAMUSCULAR | Status: DC
  Administered 2023-03-07 – 2023-03-20 (×13): 40 mg via SUBCUTANEOUS
  Filled 2023-03-07 (×13): qty 0.4

## 2023-03-07 MED ORDER — ACETAMINOPHEN 160 MG/5ML PO SOLN
650.0000 mg | ORAL | Status: DC | PRN
  Administered 2023-03-07 – 2023-03-12 (×2): 650 mg via ORAL
  Filled 2023-03-07: qty 20.3

## 2023-03-07 NOTE — Plan of Care (Signed)
 Problem: Health Behavior/Discharge Planning: Goal: Ability to manage health-related needs will improve Outcome: Progressing   Problem: Clinical Measurements: Goal: Ability to maintain clinical measurements within normal limits will improve Outcome: Progressing Goal: Will remain free from infection Outcome: Progressing Goal: Diagnostic test results will improve Outcome: Progressing Goal: Respiratory complications will improve Outcome: Progressing Goal: Cardiovascular complication will be avoided Outcome: Progressing   Problem: Activity: Goal: Risk for activity intolerance will decrease Outcome: Progressing   Problem: Coping: Goal: Level of anxiety will decrease Outcome: Progressing   Problem: Elimination: Goal: Will not experience complications related to bowel motility Outcome: Progressing Goal: Will not experience complications related to urinary retention Outcome: Progressing   Problem: Pain Management: Goal: General experience of comfort will improve Outcome: Progressing   Problem: Safety: Goal: Ability to remain free from injury will improve Outcome: Progressing   Problem: Skin Integrity: Goal: Risk for impaired skin integrity will decrease Outcome: Progressing   Problem: Education: Goal: Knowledge of disease or condition will improve Outcome: Progressing Goal: Understanding of medication regimen will improve Outcome: Progressing Goal: Individualized Educational Video(s) Outcome: Progressing   Problem: Activity: Goal: Ability to tolerate increased activity will improve Outcome: Progressing   Problem: Cardiac: Goal: Ability to achieve and maintain adequate cardiopulmonary perfusion will improve Outcome: Progressing   Problem: Health Behavior/Discharge Planning: Goal: Ability to safely manage health-related needs after discharge will improve Outcome: Progressing   Problem: Education: Goal: Will demonstrate proper wound care and an understanding of  methods to prevent future damage Outcome: Progressing Goal: Knowledge of disease or condition will improve Outcome: Progressing Goal: Knowledge of the prescribed therapeutic regimen will improve Outcome: Progressing Goal: Individualized Educational Video(s) Outcome: Progressing   Problem: Activity: Goal: Risk for activity intolerance will decrease Outcome: Progressing   Problem: Cardiac: Goal: Will achieve and/or maintain hemodynamic stability Outcome: Progressing   Problem: Clinical Measurements: Goal: Postoperative complications will be avoided or minimized Outcome: Progressing   Problem: Respiratory: Goal: Respiratory status will improve Outcome: Progressing   Problem: Skin Integrity: Goal: Wound healing without signs and symptoms of infection Outcome: Progressing Goal: Risk for impaired skin integrity will decrease Outcome: Progressing   Problem: Urinary Elimination: Goal: Ability to achieve and maintain adequate renal perfusion and functioning will improve Outcome: Progressing   Problem: Education: Goal: Knowledge of General Education information will improve Description: Including pain rating scale, medication(s)/side effects and non-pharmacologic comfort measures Outcome: Progressing   Problem: Health Behavior/Discharge Planning: Goal: Ability to manage health-related needs will improve Outcome: Progressing   Problem: Clinical Measurements: Goal: Ability to maintain clinical measurements within normal limits will improve Outcome: Progressing Goal: Will remain free from infection Outcome: Progressing Goal: Diagnostic test results will improve Outcome: Progressing Goal: Respiratory complications will improve Outcome: Progressing Goal: Cardiovascular complication will be avoided Outcome: Progressing   Problem: Activity: Goal: Risk for activity intolerance will decrease Outcome: Progressing   Problem: Nutrition: Goal: Adequate nutrition will be  maintained Outcome: Progressing   Problem: Coping: Goal: Level of anxiety will decrease Outcome: Progressing   Problem: Elimination: Goal: Will not experience complications related to bowel motility Outcome: Progressing Goal: Will not experience complications related to urinary retention Outcome: Progressing   Problem: Pain Management: Goal: General experience of comfort will improve Outcome: Progressing   Problem: Safety: Goal: Ability to remain free from injury will improve Outcome: Progressing   Problem: Skin Integrity: Goal: Risk for impaired skin integrity will decrease Outcome: Progressing   Problem: Education: Goal: Knowledge of risk factors and measures for prevention of condition will improve Outcome:  Progressing

## 2023-03-07 NOTE — Progress Notes (Signed)
NAME:  Walter Vargas, MRN:  [id number], DOB:  May 09, 1945, LOS: 21 ADMISSION DATE:  02/14/2023, CONSULTATION DATE:  03/02/2023 REFERRING MD: Dorris Fetch - TCTS, CHIEF COMPLAINT: Postoperative management  History of Present Illness:  Encephalopathic, therefore HPI obtained from EMR review.  23 yoM with PMH significant for HTN, MR, HFrEF, and CAD admitted to [facility] 11/30 with acute on chronic HFrEF and NSTEMI.  Found to be in acute Biventricular HF with moderate to severe MR.  Treated with IV heparin, diuresed and transferred to [facility] for CABG evaluation on 12/4, admitted to HF team. Additionally tested positive for COVID 12/7, s/p completion of paxlovid with hospitalization complicated by intermittent delirium.  Underwent CABG x 5 on 12/19 by Dr. Dorris Fetch, complicated by cardiac stunning and failure to wean from bypass s/p with placement of Impella 5.5.  On return to ICU, pt coagulopathic with high CT output, VT event w/suction alarm, given multiple blood products, requiring return to OR for re-exploration without obvious source found, suspected due to coagulopathy.  PCCM consulted to assist with vent and medical management postop.   Pertinent Medical History:  HTN, MR, HFrEF, CAD  Significant Hospital Events: Including procedures, antibiotic start and stop dates in addition to other pertinent events   12/20 - PCCM consulted.  Interim History / Subjective:  Patient is awake but confused, agitation has resolved Impella was titrated down to P6 with 3.7 L flow  Started spiking fever with Tmax 100.8 White count went up from 7 now to 10 Slight increase in vasopressor requirement with Levophed currently at 9 mics  Objective:  Blood pressure 106/78, pulse 98, temperature (!) 100.4 F (38 C), resp. rate (!) 23, height 5\' 8"  (1.727 m), weight 63.6 kg, SpO2 95%. CVP:  [1 mmHg-3 mmHg] 1 mmHg CO:  [0 L/min] 0 L/min      Intake/Output Summary (Last 24 hours) at 03/07/2023 0854 Last data filed at  03/07/2023 0800 Gross per 24 hour  Intake 4209.92 ml  Output 4500 ml  Net -290.08 ml   Filed Weights   03/06/23 0406 03/06/23 1100 03/07/23 0500  Weight: 64.9 kg 64.2 kg 63.6 kg   Physical Examination: General: Acute on chronically ill-appearing elderly male, lying on the bed HEENT: [place]/AT, eyes anicteric.  moist mucus membranes, nasal cannula oxygen Neuro: Awake, following commands, moving all 4 extremities, confused Chest:.  Central sternotomy incision looks clean and dry, reduced air entry at the bases bilaterally, no wheezes or rhonchi Heart: Regular rate and rhythm, no murmurs or gallops.  Impella hum sound heard Abdomen: Soft, nontender, nondistended, bowel sounds present Skin: No rash   Labs and images reviewed  Resolved Hospital Problem list    Assessment & Plan:  Multivessel CAD s/p CABG x5 Acute NSTEMI Acute on chronic biventricular HFrEF with cardiogenic shock status post Impella 5.5 Expected post-operative ABLA with coagulopathy  TEE EF 20%, RV mildly reduced Received multiple blood products during and after surgery Impella was titrated down to P6 with 3.7 L flow Had increase vasopressor requirement with norepinephrine to 9 mics Remain on milrinone at 0.3 Coox is 73% Holding diuresis Continue IV amiodarone Continue aspirin and statin  Acute respiratory failure with hypoxia Probable aspiration pneumonia Continue to titrate oxygen with O2 sat goal 92% Encourage incentive spirometry Out of bed to chair Patient started spiking fever with Tmax 100.8, white count went up from 7 now to 10 Increased vasopressor requirement from 2 mics yesterday now to 9 mics Send procalcitonin Send sputum culture Started on IV Zosyn  Hypovolemic hyponatremia Closely monitor electrolytes and supplement  COVID+ S/p Paxlovid treatment (end 12/12)  Delirium 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 Mental status is better but he remained confused Continue Seroquel 25 mg  nightly  Best Practice (right click and "Reselect all SmartList Selections" daily)   Diet/type: Tube feeds DVT prophylaxis SCD Pressure ulcer(s): N/A GI prophylaxis: PPI Lines: Central line, Arterial Line, and yes and it is still needed Foley:  Yes, and it is still needed Code Status:  full code Last date of multidisciplinary goals of care discussion [per primary team]  Critical care time:     The patient is critically ill due to cardiogenic shock/acute respiratory failure/status post CABG.  Critical care was necessary to treat or prevent imminent or life-threatening deterioration.  Critical care was time spent personally by me on the following activities: development of treatment plan with patient and/or surrogate as well as nursing, discussions with consultants, evaluation of patient's response to treatment, examination of patient, obtaining history from patient or surrogate, ordering and performing treatments and interventions, ordering and review of laboratory studies, ordering and review of radiographic studies, pulse oximetry, re-evaluation of patient's condition and participation in multidisciplinary rounds.   During this encounter critical care time was devoted to patient care services described in this note for 35 minutes.     Cheri Zabawa, MD [facility] See Amion for pager If no response to pager, please call [phone] until 7pm After 7pm, Please call E-link [phone]

## 2023-03-07 NOTE — Progress Notes (Signed)
Velcro posey safety belt applied to patient while up in chair. Patient instructed on use and how to loosen belt if needed.

## 2023-03-07 NOTE — Progress Notes (Signed)
6 Days Post-Op Procedure(s) (LRB): EXPLORATION POST OPERATIVE OPEN HEART (N/A) Subjective: Confused overnight  Objective: Vital signs in last 24 hours: Temp:  [98.2 F (36.8 C)-100.8 F (38.2 C)] 100.4 F (38 C) (12/25 0615) Pulse Rate:  [85-102] 98 (12/25 0622) Cardiac Rhythm: Sinus tachycardia (12/25 0400) Resp:  [15-32] 20 (12/25 0622) SpO2:  [91 %-100 %] 95 % (12/25 0622) Arterial Line BP: (84-139)/(55-88) 93/59 (12/25 0622) Weight:  [63.6 kg-64.2 kg] 63.6 kg (12/25 0500)  Hemodynamic parameters for last 24 hours: CVP:  [1 mmHg-3 mmHg] 1 mmHg CO:  [0 L/min] 0 L/min  Intake/Output from previous day: 12/24 0701 - 12/25 0700 In: 4143.3 [I.V.:1133.6; ZO/XW:9604.5; IV Piggyback:249.5] Out: 4410 [Urine:3990; Stool:420] Intake/Output this shift: Total I/O In: 1795.5 [I.V.:655.3; Other:91.8; NG/GT:848.3; IV Piggyback:200.1] Out: 2900 [Urine:2515; Stool:385]  General appearance: just awakened Neurologic: intact Heart: regular rate and rhythm and + hum Lungs: diminished breath sounds bibasilar Abdomen: normal findings: soft, non-tender Wound: clean and dry  Lab Results: Recent Labs    03/06/23 0338 03/07/23 0338  WBC 7.2 10.5  HGB 10.2* 10.8*  HCT 30.5* 32.4*  PLT 89* 116*   BMET:  Recent Labs    03/06/23 2309 03/07/23 0338  NA 131* 129*  K 4.0 3.8  CL 98 94*  CO2 27 26  GLUCOSE 117* 148*  BUN 17 17  CREATININE 0.64 0.68  CALCIUM 7.7* 7.8*    PT/INR: No results for input(s): "LABPROT", "INR" in the last 72 hours. ABG    Component Value Date/Time   PHART 7.481 (H) 03/05/2023 1355   HCO3 26.2 03/05/2023 1355   TCO2 27 03/05/2023 1355   ACIDBASEDEF 1.0 03/02/2023 1552   O2SAT 73.3 03/07/2023 0338   CBG (last 3)  Recent Labs    03/06/23 1514 03/06/23 2056 03/07/23 0329  GLUCAP 121* 128* 153*    Assessment/Plan: S/P Procedure(s) (LRB): EXPLORATION POST OPERATIVE OPEN HEART (N/A) POD # 6 CABG + Impella NEURO- delirium CV- Impella at p8 4.6  L/min  Co-ox 73 on milrinone 0.3, norepi 9  CVP 0-1  In SR on amiodarone RESP_ still with pleural effusions, atelectasis  Some cough RENAL- creatinine normal,  hyponatremia with diuresis Supplement K ENDO_ CBG mildly elevated Gi- tolerating TF Anemia- Hgb stable Thrombocytopenia- PLT up to 116K  Start enoxaparin ID- Fever to 100.8, WBC WNL  Pulmonary most likely source  Will discuss empiric antibiotics with CCM  LOS: 21 days    Walter Vargas 03/07/2023

## 2023-03-07 NOTE — Progress Notes (Signed)
Advanced Heart Failure Rounding Note  PCP-Cardiologist: None  Chief Complaint: Cardiogenic Shock  Subjective:    12/19: S/p CABG and Impella 5.5 Insertion. Coagulopathic on return from OR, high chest tube output, required multiple blood products. Subsequent VT associated w/ Impella suction alarms. Back to OR for re-exploratation, bleeding from EPW.   12/23: Bedside echo severe biventricular dysfunction. LVEF 25% RV moderately down. Impella well positioned  POD# 6  Remains on milrinone 0.3 and Impella 5.5 on P-8 Waveforms and flow ok (I checked position by echo and looks good)   On lasix gtt. CVP 1-2 Co-ox 73%  Low-grade temps overnight. Zosyn started  A bit confused today. No CP or SOB  Objective:   Weight Range: 63.6 kg Body mass index is 21.32 kg/m.   Vital Signs:   Temp:  [98.2 F (36.8 C)-100.8 F (38.2 C)] 100.4 F (38 C) (12/25 0615) Pulse Rate:  [92-102] 98 (12/25 0900) Resp:  [16-32] 19 (12/25 0900) SpO2:  [92 %-100 %] 96 % (12/25 0900) Arterial Line BP: (84-139)/(55-88) 109/63 (12/25 0900) Weight:  [63.6 kg-64.2 kg] 63.6 kg (12/25 0500) Last BM Date : 03/07/23  Weight change: Filed Weights   03/06/23 0406 03/06/23 1100 03/07/23 0500  Weight: 64.9 kg 64.2 kg 63.6 kg   Intake/Output:   Intake/Output Summary (Last 24 hours) at 03/07/2023 1028 Last data filed at 03/07/2023 1000 Gross per 24 hour  Intake 3494.11 ml  Output 4485 ml  Net -990.89 ml    Physical Exam   General:  thin, elderly appearing. No respiratory difficulty HEENT: normal + cor trak  Neck: supple. no JVD. RIJ ccentral line Cor: Impella site and sternal wound ok. Regular rate & rhythm. No rubs, gallops or murmurs. Lungs: clear Abdomen: soft, nontender, nondistended. No hepatosplenomegaly. No bruits or masses. Good bowel sounds. Extremities: no cyanosis, clubbing, rash, edema Neuro: awake conversant mildly confused , cranial nerves grossly intact. moves all 4 extremities w/o  difficulty. Affect pleasant   Telemetry   NSR 70-80s Personally reviewed  Patient Profile   Walter Vargas is a 77 y.o. history of HTN, Walter, HFrEF, and severe 3V CAD.  S/p CABG and Impella 5.5 Insertion 12/19 w D.r Dorris Fetch.   Assessment/Plan   Cardiogenic Postoperative Shock - Biventricular failure at b/l. Pre CABG echo EF < 20%, RV moderately reduced (PAPi was adequate on RHC) - Intra-op course complicated by cardiac stun and failure to wean from bypass. Impella 5.5 placed  - intra op TEE EF 20%, RV mildly reduced  - c/w Impella 5.5 @ P8. Flow 4.6 No alarms - on Milrinone 0.3. Ernestine Conrad is out.  Co-ox 73%  - Has been diuresed well CVP 1-2. Stop lasix gtt - POCUS echo today EF 25-30% RV improved. Impella well positioned. - Turn Impella to P-6. Wean Impella over next 24-48 hours   2. CAD: Patient admitted with NSTEMI (?culprit D2) but with severe extensive diffuse 3 vessel disease.  Disease too extensive to treat percutaneously.  Anatomy felt best served by CABG. - s/p CABG and Impella 5.5 insertion w Dr. Dorris Fetch 12/19. - Continue ASA 81  - Continue atorva 80 - No s/s angina. No b-blokcer yet with shock   3. Acute on chronic systolic CHF: Patient has had HF symptoms for at least a couple of months.  Suspect pre-existing ischemic cardiomyopathy then he presented this admission with NSTEMI possible in D2 territory leading to CHF exacerbation. RHC 12/3 showed normal filling pressures and low CI at 2.0. Echo 12/4: EF  20%, RV mild-moderately dysfunctional, mild-moderate Walter.  - continue Impella and milrinone support  - plan as above - stop lasix.  - Wean Impella  4. Post-Operative Bleeding/ ABLA -coagulopathic on return from OR - Supported with multiple units of blood products including RBCs, cryo, FFP and Factor VII + albumin and protamine, had to return to the OR. Bleeding noted around chest tubes - now stable. CTs pulled - Hgb stable at 10.8 - continue to monitor closely   5.  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  Patient has had on and off confusion in hospital.  - labile  6. Fever - encourage IS - cover with Zosyn  7. COVID+: Completed Paxlovid  8. 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  9. Acute Hypoxic Respiratory Failure - now extubated  - continue diuresis as above  CRITICAL CARE Performed by: Arvilla Meres  Total critical care time: 45 minutes  Critical care time was exclusive of separately billable procedures and treating other patients.  Critical care was necessary to treat or prevent imminent or life-threatening deterioration.  Critical care was time spent personally by me (independent of midlevel providers or residents) on the following activities: development of treatment plan with patient and/or surrogate as well as nursing, discussions with consultants, evaluation of patient's response to treatment, examination of patient, obtaining history from patient or surrogate, ordering and performing treatments and interventions, ordering and review of laboratory studies, ordering and review of radiographic studies, pulse oximetry and re-evaluation of patient's condition.       Length of Stay: 21  Arvilla Meres, MD  03/07/2023, 10:28 AM  Advanced Heart Failure Team Pager [phone] (M-F; 7a - 5p)  Please contact [facility] for night-coverage after hours (5p -7a ) and weekends on [email or web address]

## 2023-03-07 NOTE — Plan of Care (Signed)
  Problem: Health Behavior/Discharge Planning: Goal: Ability to manage health-related needs will improve Outcome: Progressing   Problem: Clinical Measurements: Goal: Ability to maintain clinical measurements within normal limits will improve Outcome: Progressing Goal: Will remain free from infection Outcome: Progressing Goal: Cardiovascular complication will be avoided Outcome: Progressing   Problem: Activity: Goal: Risk for activity intolerance will decrease Outcome: Progressing   Problem: Safety: Goal: Ability to remain free from injury will improve Outcome: Progressing   Problem: Clinical Measurements: Goal: Ability to maintain clinical measurements within normal limits will improve Outcome: Progressing

## 2023-03-07 NOTE — Progress Notes (Signed)
      [address]       [place] [address]             [phone]    POD # 6  Visiting with family  BP 93/73   Pulse 89   Temp 98.8 F (37.1 C) (Oral)   Resp 20   Ht 5\' 8"  (1.727 m)   Wt 63.6 kg   SpO2 95%   BMI 21.32 kg/m  P6 3.9L/min Required precedex for confusion  Intake/Output Summary (Last 24 hours) at 03/07/2023 1726 Last data filed at 03/07/2023 1724 Gross per 24 hour  Intake 3283.62 ml  Output 4630 ml  Net -1346.38 ml    Viviann Spare [name], MD [facility] [phone]

## 2023-03-08 DIAGNOSIS — J690 Pneumonitis due to inhalation of food and vomit: Secondary | ICD-10-CM

## 2023-03-08 LAB — COOXEMETRY PANEL
Carboxyhemoglobin: 3.1 % — ABNORMAL HIGH (ref 0.5–1.5)
Methemoglobin: 2.2 % — ABNORMAL HIGH (ref 0.0–1.5)
O2 Saturation: 66.7 %
Total hemoglobin: 10.3 g/dL — ABNORMAL LOW (ref 12.0–16.0)

## 2023-03-08 LAB — BASIC METABOLIC PANEL
Anion gap: 9 (ref 5–15)
Anion gap: 9 (ref 5–15)
BUN: 17 mg/dL (ref 8–23)
BUN: 22 mg/dL (ref 8–23)
CO2: 24 mmol/L (ref 22–32)
CO2: 24 mmol/L (ref 22–32)
Calcium: 7.9 mg/dL — ABNORMAL LOW (ref 8.9–10.3)
Calcium: 8 mg/dL — ABNORMAL LOW (ref 8.9–10.3)
Chloride: 99 mmol/L (ref 98–111)
Chloride: 100 mmol/L (ref 98–111)
Creatinine, Ser: 0.63 mg/dL (ref 0.61–1.24)
Creatinine, Ser: 0.68 mg/dL (ref 0.61–1.24)
GFR, Estimated: >60 mL/min (ref >60)
GFR, Estimated: >60 mL/min (ref >60)
Glucose, Bld: 163 mg/dL — ABNORMAL HIGH (ref 70–99)
Glucose, Bld: 136 mg/dL — ABNORMAL HIGH (ref 70–99)
Potassium: 4 mmol/L (ref 3.5–5.1)
Potassium: 4.2 mmol/L (ref 3.5–5.1)
Sodium: 132 mmol/L — ABNORMAL LOW (ref 135–145)
Sodium: 133 mmol/L — ABNORMAL LOW (ref 135–145)

## 2023-03-08 LAB — CBC
HCT: 29.5 % — ABNORMAL LOW (ref 39.0–52.0)
Hemoglobin: 9.9 g/dL — ABNORMAL LOW (ref 13.0–17.0)
MCH: 29.1 pg (ref 26.0–34.0)
MCHC: 33.6 g/dL (ref 30.0–36.0)
MCV: 86.8 fL (ref 80.0–100.0)
Platelets: 149 10*3/uL — ABNORMAL LOW (ref 150–400)
RBC: 3.4 MIL/uL — ABNORMAL LOW (ref 4.22–5.81)
RDW: 16.4 % — ABNORMAL HIGH (ref 11.5–15.5)
WBC: 10.7 10*3/uL — ABNORMAL HIGH (ref 4.0–10.5)
nRBC: 0.4 % — ABNORMAL HIGH (ref 0.0–0.2)

## 2023-03-08 LAB — GLUCOSE, CAPILLARY
Glucose-Capillary: 153 mg/dL — ABNORMAL HIGH (ref 70–99)
Glucose-Capillary: 156 mg/dL — ABNORMAL HIGH (ref 70–99)
Glucose-Capillary: 90 mg/dL (ref 70–99)
Glucose-Capillary: 79 mg/dL (ref 70–99)

## 2023-03-08 LAB — MAGNESIUM: Magnesium: 1.8 mg/dL (ref 1.7–2.4)

## 2023-03-08 MED ORDER — GUAIFENESIN 100 MG/5ML PO LIQD
15.0000 mL | Freq: Four times a day (QID) | ORAL | Status: DC
Start: 2023-03-08 — End: 2023-03-12
  Administered 2023-03-08 – 2023-03-11 (×15): 15 mL via ORAL
  Filled 2023-03-08 (×15): qty 15

## 2023-03-08 MED ORDER — OXYCODONE HCL 5 MG/5ML PO SOLN: 5.0000 mg | ORAL | Status: DC | PRN

## 2023-03-08 MED ORDER — SALINE SPRAY 0.65 % NA SOLN: 1.0000 | NASAL | Status: DC | PRN

## 2023-03-08 MED ORDER — ATORVASTATIN CALCIUM 80 MG PO TABS
80.0000 mg | ORAL_TABLET | Freq: Every day | ORAL | Status: DC
  Administered 2023-03-09 – 2023-03-11 (×3): 80 mg via ORAL
  Filled 2023-03-08 (×3): qty 1

## 2023-03-08 MED ORDER — FUROSEMIDE 10 MG/ML IJ SOLN
40.0000 mg | Freq: Once | INTRAMUSCULAR | Status: AC
  Administered 2023-03-08: 40 mg via INTRAVENOUS
  Filled 2023-03-08: qty 4

## 2023-03-08 MED ORDER — THIAMINE MONONITRATE 100 MG PO TABS
100.0000 mg | ORAL_TABLET | Freq: Every day | ORAL | Status: AC
  Administered 2023-03-09 – 2023-03-11 (×3): 100 mg via ORAL
  Filled 2023-03-08 (×3): qty 1

## 2023-03-08 MED ORDER — POTASSIUM CHLORIDE 20 MEQ PO PACK
40.0000 meq | PACK | Freq: Once | ORAL | Status: AC
  Administered 2023-03-08: 40 meq
  Filled 2023-03-08: qty 2

## 2023-03-08 MED ORDER — ASPIRIN 81 MG PO CHEW
81.0000 mg | CHEWABLE_TABLET | Freq: Every day | ORAL | Status: DC
  Administered 2023-03-09 – 2023-03-11 (×3): 81 mg via ORAL
  Filled 2023-03-08 (×3): qty 1

## 2023-03-08 MED ORDER — MAGNESIUM SULFATE 2 GM/50ML IV SOLN
2.0000 g | Freq: Once | INTRAVENOUS | Status: AC
  Administered 2023-03-08: 2 g via INTRAVENOUS
  Filled 2023-03-08: qty 50

## 2023-03-08 MED ORDER — DOCUSATE SODIUM 50 MG/5ML PO LIQD: 200.0000 mg | Freq: Every day | ORAL | Status: DC

## 2023-03-08 NOTE — Progress Notes (Signed)
Physical Therapy Treatment Patient Details Name: Walter Vargas MRN: [id number] DOB: 02/05/1946 Today's Date: 03/08/2023   History of Present Illness 77 y.o. male admitted 02/13/22 with NSTEMI, CHF exacerbation. LHC with 3-vessel CAD. 12/9 Pt with fall in room. CABG delayed due to covid +. 12/19 S/p CABG x 5, TEE, and placement of impella and swan-ganz. Coagulopathic on return from OR, high chest tube output, required multiple blood products. 12/20 Back to OR for re-exploratation, bleeding from EPW. ETT 12/19 - 12/20. Chest tubes 12/19 - 12/21. Cortrak placed 12/20. PMHx: HTN, HF, mod/severe MR.    PT Comments  Pt is slowly progressing towards goals. Pt requires more assist currently for functional activities due to lines and leads. Pt was Min A for trunk and LE for bed mobility, Mod-Min A +2 for sit to stand and Min A +2 for gait with chair follow secondary to to fatigue. Pt has good family support. Due to pt current functional status, home set up and available assistance at home recommending skilled physical therapy services > 3 hours/day in order to address strength, balance and functional mobility to decrease risk for falls, injury, immobility, skin break down and re-hospitalization.      If plan is discharge home, recommend the following: Assistance with cooking/housework;Assist for transportation;Supervision due to cognitive status;A lot of help with walking and/or transfers;Help with stairs or ramp for entrance     Equipment Recommendations  BSC/3in1       Precautions / Restrictions Precautions Precautions: Fall;Sternal;Other (comment) Precaution Booklet Issued: No Precaution Comments: Impella;  A-line; temp pacer, flexiseal Restrictions Weight Bearing Restrictions Per Provider Order: Yes RUE Weight Bearing Per Provider Order: Non weight bearing LUE Weight Bearing Per Provider Order: Non weight bearing Other Position/Activity Restrictions: sternal precautions     Mobility  Bed  Mobility Overal bed mobility: Needs Assistance Bed Mobility: Sidelying to Sit   Sidelying to sit: Min assist       General bed mobility comments: multimodal cues for sequencing and Min A for LE then trunk to progress to mid line.    Transfers Overall transfer level: Needs assistance Equipment used: Rolling walker (2 wheels) Transfers: Sit to/from Stand Sit to Stand: +2 safety/equipment, Mod assist, Min assist           General transfer comment: heavy retropulsion in standing heavy initially then improved to Min A +2 for second attempt. Improved command following    Ambulation/Gait Ambulation/Gait assistance: Min assist, +2 physical assistance, +2 safety/equipment Gait Distance (Feet): 80 Feet Assistive device: Rolling walker (2 wheels) Gait Pattern/deviations: Step-through pattern, Decreased stride length, Leaning posteriorly, Narrow base of support Gait velocity: normal Gait velocity interpretation: <1.8 ft/sec, indicate of risk for recurrent falls   General Gait Details: intermittent posterior bias, able to correct. Verbal cues throughout for larger steps then higher steps. Pt responded better to cueing for higher steps. +3 additional assist for IV poles, impella and chair follow      Balance Overall balance assessment: Needs assistance Sitting-balance support: No upper extremity supported, Feet supported Sitting balance-Leahy Scale: Fair Sitting balance - Comments: able to sit EOB and recliner without falling posteriorly with feet on the floor   Standing balance support: Bilateral upper extremity supported, Reliant on assistive device for balance Standing balance-Leahy Scale: Poor Standing balance comment: mod assist for standing balance with reliance on RW and physical support due to posterior bias intermittently heavier CGA to Max A          Cognition Arousal: Alert Behavior During  Therapy: Flat affect, Anxious Overall Cognitive Status: Impaired/Different from  baseline     Following Commands: Follows one step commands with increased time, Follows one step commands consistently Safety/Judgement: Decreased awareness of safety, Decreased awareness of deficits   Problem Solving: Slow processing, Decreased initiation, Difficulty sequencing, Requires verbal cues, Requires tactile cues General Comments: poor implementation of sternal precautions even with direct commands regarding hand palcement during transfers and bed mobility           General Comments General comments (skin integrity, edema, etc.): Impella at P5 on 4L O2 [address]      Pertinent Vitals/Pain Pain Assessment Pain Assessment: No/denies pain     PT Goals (current goals can now be found in the care plan section) Acute Rehab PT Goals Patient Stated Goal: to improve PT Goal Formulation: With patient Time For Goal Achievement: 03/17/23 Potential to Achieve Goals: Good Progress towards PT goals: Progressing toward goals    Frequency    Min 1X/week      PT Plan  Continue with current POC        AM-PAC PT "6 Clicks" Mobility   Outcome Measure  Help needed turning from your back to your side while in a flat bed without using bedrails?: A Little Help needed moving from lying on your back to sitting on the side of a flat bed without using bedrails?: A Little Help needed moving to and from a bed to a chair (including a wheelchair)?: A Lot Help needed standing up from a chair using your arms (e.g., wheelchair or bedside chair)?: A Lot Help needed to walk in hospital room?: A Lot Help needed climbing 3-5 steps with a railing? : Total 6 Click Score: 13    End of Session Equipment Utilized During Treatment: Oxygen;Gait belt Activity Tolerance: Patient tolerated treatment well Patient left: in chair;with call bell/phone within reach;with chair alarm set;with nursing/sitter in room Nurse Communication: Mobility status;Precautions PT Visit Diagnosis: Other abnormalities of  gait and mobility (R26.89);Unsteadiness on feet (R26.81);Muscle weakness (generalized) (M62.81);Difficulty in walking, not elsewhere classified (R26.2)     Time: 1610-9604 PT Time Calculation (min) (ACUTE ONLY): 43 min  Charges:    $Gait Training: 8-22 mins $Therapeutic Activity: 23-37 mins PT General Charges $$ ACUTE PT VISIT: 1 Visit                     Harrel Carina, DPT, CLT  Acute Rehabilitation Services Office: [phone] (Secure chat preferred)    Claudia Desanctis 03/08/2023, 12:50 PM

## 2023-03-08 NOTE — Progress Notes (Signed)
Patient ID: Walter Vargas, male   DOB: 1945-08-17, 77 y.o.   MRN: [id number]  TCTS Evening Rounds:  Hemodynamically stable on milrinone 0.3, NE 3, Impella P5. Sinus 80's  UO good.  BMET    Component Value Date/Time   NA 133 (L) 03/08/2023 1813   K 4.2 03/08/2023 1813   CL 100 03/08/2023 1813   CO2 24 03/08/2023 1813   GLUCOSE 136 (H) 03/08/2023 1813   BUN 22 03/08/2023 1813   CREATININE 0.68 03/08/2023 1813   CALCIUM 8.0 (L) 03/08/2023 1813   GFRNONAA >60 03/08/2023 1813   Has been calm this evening.

## 2023-03-08 NOTE — Progress Notes (Signed)
Inpatient Rehab Admissions Coordinator:   Still working to wean impella.  On NE and amio gtt.  I will f/u with patient and family on Monday.   Estill Dooms, PT, DPT Admissions Coordinator [phone] 03/08/23  2:56 PM

## 2023-03-08 NOTE — Plan of Care (Signed)
 Problem: Health Behavior/Discharge Planning: Goal: Ability to manage health-related needs will improve Outcome: Progressing   Problem: Clinical Measurements: Goal: Ability to maintain clinical measurements within normal limits will improve Outcome: Progressing Goal: Will remain free from infection Outcome: Progressing Goal: Diagnostic test results will improve Outcome: Progressing Goal: Respiratory complications will improve Outcome: Progressing Goal: Cardiovascular complication will be avoided Outcome: Progressing   Problem: Activity: Goal: Risk for activity intolerance will decrease Outcome: Progressing   Problem: Coping: Goal: Level of anxiety will decrease Outcome: Progressing   Problem: Elimination: Goal: Will not experience complications related to bowel motility Outcome: Progressing Goal: Will not experience complications related to urinary retention Outcome: Progressing   Problem: Pain Management: Goal: General experience of comfort will improve Outcome: Progressing   Problem: Safety: Goal: Ability to remain free from injury will improve Outcome: Progressing   Problem: Skin Integrity: Goal: Risk for impaired skin integrity will decrease Outcome: Progressing   Problem: Education: Goal: Knowledge of disease or condition will improve Outcome: Progressing Goal: Understanding of medication regimen will improve Outcome: Progressing Goal: Individualized Educational Video(s) Outcome: Progressing   Problem: Activity: Goal: Ability to tolerate increased activity will improve Outcome: Progressing   Problem: Cardiac: Goal: Ability to achieve and maintain adequate cardiopulmonary perfusion will improve Outcome: Progressing   Problem: Health Behavior/Discharge Planning: Goal: Ability to safely manage health-related needs after discharge will improve Outcome: Progressing   Problem: Education: Goal: Will demonstrate proper wound care and an understanding of  methods to prevent future damage Outcome: Progressing Goal: Knowledge of disease or condition will improve Outcome: Progressing Goal: Knowledge of the prescribed therapeutic regimen will improve Outcome: Progressing Goal: Individualized Educational Video(s) Outcome: Progressing   Problem: Activity: Goal: Risk for activity intolerance will decrease Outcome: Progressing   Problem: Cardiac: Goal: Will achieve and/or maintain hemodynamic stability Outcome: Progressing   Problem: Clinical Measurements: Goal: Postoperative complications will be avoided or minimized Outcome: Progressing   Problem: Respiratory: Goal: Respiratory status will improve Outcome: Progressing   Problem: Skin Integrity: Goal: Wound healing without signs and symptoms of infection Outcome: Progressing Goal: Risk for impaired skin integrity will decrease Outcome: Progressing   Problem: Urinary Elimination: Goal: Ability to achieve and maintain adequate renal perfusion and functioning will improve Outcome: Progressing   Problem: Education: Goal: Knowledge of General Education information will improve Description: Including pain rating scale, medication(s)/side effects and non-pharmacologic comfort measures Outcome: Progressing   Problem: Health Behavior/Discharge Planning: Goal: Ability to manage health-related needs will improve Outcome: Progressing   Problem: Clinical Measurements: Goal: Ability to maintain clinical measurements within normal limits will improve Outcome: Progressing Goal: Will remain free from infection Outcome: Progressing Goal: Diagnostic test results will improve Outcome: Progressing Goal: Respiratory complications will improve Outcome: Progressing Goal: Cardiovascular complication will be avoided Outcome: Progressing   Problem: Activity: Goal: Risk for activity intolerance will decrease Outcome: Progressing   Problem: Nutrition: Goal: Adequate nutrition will be  maintained Outcome: Progressing   Problem: Coping: Goal: Level of anxiety will decrease Outcome: Progressing   Problem: Elimination: Goal: Will not experience complications related to bowel motility Outcome: Progressing Goal: Will not experience complications related to urinary retention Outcome: Progressing   Problem: Pain Management: Goal: General experience of comfort will improve Outcome: Progressing   Problem: Safety: Goal: Ability to remain free from injury will improve Outcome: Progressing   Problem: Skin Integrity: Goal: Risk for impaired skin integrity will decrease Outcome: Progressing   Problem: Education: Goal: Knowledge of risk factors and measures for prevention of condition will improve Outcome:  Progressing

## 2023-03-08 NOTE — Consult Note (Signed)
[facility] Liaison Consult Note   03/08/2023  Walter Vargas [birth year] [id number]  Follow up review:  LLOS 21 days, transferred from [facility] to [facility] ICU  Insurance: [employer] Dual Complete   Primary Care Provider: [name], NP    [facility] Liaison follow up review patient remains in ICU at ICU level of care and review for ongoing planning for post hospital needs.  Reviewed Rehab admissions notes, MD progress notes today.    Plan: [facility] Liaison will continue to follow progress and disposition to asess for post hospital community care coordination/management needs.  Referral request for community care coordination: disposition currently not complete.   [facility] Community Care, Population Health does not replace or interfere with any arrangements made by the Inpatient Transition of Care team.   For questions contact:   Charlesetta Shanks, RN, BSN, CCM [place]  [facility] Liaison Direct Dial: [phone] or secure chat Email: [email or web address]

## 2023-03-08 NOTE — Progress Notes (Addendum)
TCTS DAILY ICU PROGRESS NOTE                   [address]            [place] [address]          [phone]   7 Days Post-Op Procedure(s) (LRB): EXPLORATION POST OPERATIVE OPEN HEART (N/A)  Total Length of Stay:  LOS: 22 days   Subjective: Up in the bedside chair, just returned from walking in the hall.  Precedex is off. He is alert, calm and cooperative.    Milrinone at 0.22mcg/kg/min NorEpi at 64mcg/min Amiodarone at 30mg /hr  Impella at P-5, CoOx 67 (with Impella at P-6)   Objective: Vital signs in last 24 hours: Temp:  [97.6 F (36.4 C)-99.8 F (37.7 C)] 97.6 F (36.4 C) (12/26 0800) Pulse Rate:  [80-103] 88 (12/26 1045) Cardiac Rhythm: Normal sinus rhythm (12/26 0800) Resp:  [14-33] 28 (12/26 1045) BP: (93-120)/(73-89) 102/75 (12/26 0900) SpO2:  [81 %-100 %] 94 % (12/26 1045) Arterial Line BP: (72-142)/(25-88) 104/65 (12/26 1045) Weight:  [62.1 kg] 62.1 kg (12/26 0422)  Filed Weights   03/06/23 1100 03/07/23 0500 03/08/23 0422  Weight: 64.2 kg 63.6 kg 62.1 kg    Weight change: -2.1 kg   Hemodynamic parameters for last 24 hours: CVP:  [2 mmHg-49 mmHg] 4 mmHg  Intake/Output from previous day: 12/25 0701 - 12/26 0700 In: 3161.3 [I.V.:1189.5; NG/GT:1520; IV Piggyback:199.7] Out: 3320 [Urine:2850; Emesis/NG output:200; Stool:270]  Intake/Output this shift: Total I/O In: 330.6 [I.V.:134.8; Other:27.9; NG/GT:165; IV Piggyback:3] Out: 235 [Urine:200; Stool:35]  Current Meds: Scheduled Meds:  aspirin  81 mg Per Tube Daily   atorvastatin  80 mg Per Tube Daily   bisacodyl  10 mg Oral Daily   Or   bisacodyl  10 mg Rectal Daily   Chlorhexidine Gluconate Cloth  6 each Topical Daily   docusate  200 mg Per Tube Daily   enoxaparin (LOVENOX) injection  40 mg Subcutaneous Q24H   feeding supplement (PROSource TF20)  60 mL Per Tube Daily   guaiFENesin  15 mL Oral Q6H   insulin aspart  0-15 Units Subcutaneous Q6H   mouth rinse  15 mL Mouth Rinse  QID   pantoprazole (PROTONIX) IV  40 mg Intravenous QHS   QUEtiapine  25 mg Oral QHS   spironolactone  12.5 mg Oral Daily   thiamine  100 mg Per Tube Daily   Continuous Infusions:  amiodarone 30 mg/hr (03/08/23 1000)   dexmedetomidine (PRECEDEX) IV infusion Stopped (03/08/23 0833)   feeding supplement (VITAL 1.5 CAL) 55 mL/hr at 03/08/23 1000   milrinone 0.3 mcg/kg/min (03/08/23 1000)   norepinephrine (LEVOPHED) Adult infusion 2 mcg/min (03/08/23 1000)   piperacillin-tazobactam (ZOSYN)  IV 12.5 mL/hr at 03/08/23 1000   sodium bicarbonate 25 mEq (Impella PURGE) in dextrose 5 % 1000 mL bag     PRN Meds:.sodium chloride, acetaminophen (TYLENOL) oral liquid 160 mg/5 mL, diphenhydrAMINE, fentaNYL, morphine injection, ondansetron (ZOFRAN) IV, mouth rinse, oxyCODONE  General appearance: alert, cooperative, and no distress Neurologic: calm and cooperative, no focal deficits. Swallow eval pending.  Heart: monotor showing SR, Impella at P-5 Lungs: clear anterior, diminished both bases. CXR with R>L pleral effusion, bibasilar ATX.  Abdomen: soft, no tenderness Extremities: well perfused no peripheral edema. Expected bruising right ankle.  Wound: the Impella insertion site and sternotomy incision are dry.    Lab Results: CBC: Recent Labs    03/07/23 0338 03/08/23 0345  WBC 10.5 10.7*  HGB 10.8* 9.9*  HCT 32.4* 29.5*  PLT 116* 149*   BMET:  Recent Labs    03/07/23 1625 03/08/23 0345  NA 131* 132*  K 4.1 4.0  CL 97* 99  CO2 24 24  GLUCOSE 127* 163*  BUN 20 17  CREATININE 0.66 0.63  CALCIUM 7.8* 7.9*    CMET: Lab Results  Component Value Date   WBC 10.7 (H) 03/08/2023   HGB 9.9 (L) 03/08/2023   HCT 29.5 (L) 03/08/2023   PLT 149 (L) 03/08/2023   GLUCOSE 163 (H) 03/08/2023   CHOL 90 02/11/2023   TRIG 45 02/11/2023   HDL 38 (L) 02/11/2023   LDLCALC 43 02/11/2023   ALT 11 03/03/2023   AST 39 03/03/2023   NA 132 (L) 03/08/2023   K 4.0 03/08/2023   CL 99 03/08/2023    CREATININE 0.63 03/08/2023   BUN 17 03/08/2023   CO2 24 03/08/2023   TSH 4.481 02/11/2023   PSA 0.74 06/21/2022   INR 1.6 (H) 03/03/2023   HGBA1C 5.9 (H) 02/28/2023      PT/INR: No results for input(s): "LABPROT", "INR" in the last 72 hours. Radiology: No results found.   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  Hemodynamics slowly improving, HF team weaning support.   -PULM- On Zosyn per CCM for suspected aspiration pneumonia. Normal work of breathing on 4Lnc.  Small bilateral effusions with ATX. Diuresing as BP allows, encouraging pulm hygiene.  COVID positive on admission screening, received Paxlovid course prior to surgery.    -GI- receiving TF at goal per small-bore NG.  Awaiting swallow eval per speech therapy before offering PO's. Passing stool.  -NEURO- intermittent confusion before and after surgery. On Precedes yesterday for agitation, anxiety. He is calm and cooperative now off of Precedex. On daily Seroquel.  -RENAL- normal function, Wt is ~6kg+ if accurate.  Diuretic mgt per HF tea.   -Left carotid stenosis- seen on pre-op screening duplex.  Evaluated by Dr. Hetty Blend ([facility]) before CABG.  TCAR vs CEA planned after recovery from CABG.   -DVT PPX- on enoxaparin, ambulating.     Leary Roca, PA-C [phone] 03/08/2023 10:59 AM  Patient seen and examined, agree with above Impella turned down to p5- tolerating so far Hopefully can progress to removal tomorrow Delirium worse over past 24 hours Day 2 Zosyn  Viviann Spare [name], MD [facility] [phone]

## 2023-03-08 NOTE — Progress Notes (Addendum)
Advanced Heart Failure Rounding Note  PCP-Cardiologist: None  Chief Complaint: Cardiogenic Shock  Subjective:    12/19: S/p CABG and Impella 5.5 Insertion. Coagulopathic on return from OR, high chest tube output, required multiple blood products. Subsequent VT associated w/ Impella suction alarms. Back to OR for re-exploratation, bleeding from EPW.   12/23: Bedside echo severe biventricular dysfunction. LVEF 25% RV moderately down. Impella well positioned  12/25: started on Zosyn for fevers/suspected aspiration PNA. PCT 0.34   POD# 7  Back on precedex due to agitation. Sleeping    Remains on milrinone 0.3 + NE 4 and Impella 5.5 on P-6, Flow 3.8L   Co-ox 67%  CVP 6 but LVEDP up at 20.   Continues on Zosyn. Fevers resolved     Objective:   Weight Range: 62.1 kg Body mass index is 20.82 kg/m.   Vital Signs:   Temp:  [97.8 F (36.6 C)-99.8 F (37.7 C)] 99.8 F (37.7 C) (12/26 0400) Pulse Rate:  [80-103] 93 (12/26 0800) Resp:  [14-33] 19 (12/26 0800) BP: (93-120)/(73-89) 108/77 (12/26 0800) SpO2:  [81 %-100 %] 97 % (12/26 0800) Arterial Line BP: (72-142)/(25-80) 103/62 (12/26 0800) Weight:  [62.1 kg] 62.1 kg (12/26 0422) Last BM Date : 03/08/23  Weight change: Filed Weights   03/06/23 1100 03/07/23 0500 03/08/23 0422  Weight: 64.2 kg 63.6 kg 62.1 kg   Intake/Output:   Intake/Output Summary (Last 24 hours) at 03/08/2023 0834 Last data filed at 03/08/2023 0800 Gross per 24 hour  Intake 3135.25 ml  Output 3270 ml  Net -134.75 ml    Physical Exam   General:  thin elderly male appearing. No respiratory difficulty HEENT: normal + cortrak  Neck: supple. JVD 7 cm. Carotids 2+ bilat; no bruits. No lymphadenopathy or thyromegaly appreciated. Cor: PMI nondisplaced. Regular rate & rhythm. No rubs, gallops or murmurs. Lungs: clear Abdomen: soft, nontender, nondistended. No hepatosplenomegaly. No bruits or masses. Good bowel sounds. Extremities: no cyanosis,  clubbing, rash, edema Neuro: sedated   Telemetry   NSR 70-80s Personally reviewed  Patient Profile   Walter Vargas is a 77 y.o. history of HTN, Walter, HFrEF, and severe 3V CAD.  S/p CABG and Impella 5.5 Insertion 12/19 w D.r Dorris Fetch.   Assessment/Plan   Cardiogenic Postoperative Shock - Biventricular failure at b/l. Pre CABG echo EF < 20%, RV moderately reduced (PAPi was adequate on RHC) - Intra-op course complicated by cardiac stun and failure to wean from bypass. Impella 5.5 placed  - intra op TEE EF 20%, RV mildly reduced  - POCUS 12/25 EF 25-30% RV improved. Impella well positioned - c/w Impella 5.5 @ P6. Flow 3.8 No alarms.  - on Milrinone 0.3 + NE 3. Ernestine Conrad is out.  Co-ox 67%  - Volume status trending up, LVEDP 20 - Give IV Lasix 40 mg x 1  - Wean Impella down to P-5  2. CAD: Patient admitted with NSTEMI (?culprit D2) but with severe extensive diffuse 3 vessel disease.  Disease too extensive to treat percutaneously.  Anatomy felt best served by CABG. - s/p CABG and Impella 5.5 insertion w Dr. Dorris Fetch 12/19. - Continue ASA 81  - Continue atorva 80 - No s/s angina. No b-blokcer yet with shock   3. Acute on chronic systolic CHF: Patient has had HF symptoms for at least a couple of months.  Suspect pre-existing ischemic cardiomyopathy then he presented this admission with NSTEMI possible in D2 territory leading to CHF exacerbation. RHC 12/3 showed normal filling pressures  and low CI at 2.0. Echo 12/4: EF 20%, RV mild-moderately dysfunctional, mild-moderate Walter.  - continue Impella and milrinone support  - plan as above - IV Lasix 40 mg x 1 per above  - Wean Impella to P-5   4. Post-Operative Bleeding/ ABLA -coagulopathic on return from OR - Supported with multiple units of blood products including RBCs, cryo, FFP and Factor VII + albumin and protamine, had to return to the OR. Bleeding noted around chest tubes - now stable. CTs pulled - Hgb stable at 9.9 - continue to  monitor closely   5. 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  Patient has had on and off confusion in hospital.  - labile  6. Fevers/ Suspected Aspiration PNA - PCT 0.34 - Zosyn started 12/25 - fevers resolved - abx duration per CCM   7. COVID+: Completed Paxlovid  8. 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  9. Acute Hypoxic Respiratory Failure - now extubated  - diuretics/ abx per above        Length of Stay: [address] PA-C  03/08/2023, 8:34 AM  Advanced Heart Failure Team Pager [phone] (M-F; 7a - 5p)  Please contact [facility] for night-coverage after hours (5p -7a ) and weekends on [email or web address]   Agree with above.   Remains on Impella at P-6. Flowing well. On milrinone 0.3 and NE 3. Co-ox 67% CVP 5-6  LVEDP 20 on Impella   Continues with delirium. Now sedated with Precedex. On zosyn   General:  Sedated but arousable HEENT: normal Neck: supple. RIC TLC. Carotids 2+ bilat; no bruits. No lymphadenopathy or thryomegaly appreciated. Cor: Impella site and sternal wound ok  Regular rate & rhythm. No rubs, gallops or murmurs. Lungs: clear Abdomen: soft, nontender, nondistended. No hepatosplenomegaly. No bruits or masses. Good bowel sounds. Extremities: no cyanosis, clubbing, rash, edema Neuro: sedated but arousable. Non-focal   Will give one dose IV lasix today. Wean Impella to P-5 and recheck co-ox. Will wean Impella further throughout the day as tolerated. Continue epi and milrinone to support impella wean.   Management of sedation per TCTS and CCM.   CRITICAL CARE Performed by: Arvilla Meres  Total critical care time: 45 minutes  Critical care time was exclusive of separately billable procedures and treating other patients.  Critical care was necessary to treat or prevent imminent or life-threatening  deterioration.  Critical care was time spent personally by me (independent of midlevel providers or residents) on the following activities: development of treatment plan with patient and/or surrogate as well as nursing, discussions with consultants, evaluation of patient's response to treatment, examination of patient, obtaining history from patient or surrogate, ordering and performing treatments and interventions, ordering and review of laboratory studies, ordering and review of radiographic studies, pulse oximetry and re-evaluation of patient's condition.  Arvilla Meres, MD  9:33 AM

## 2023-03-08 NOTE — Progress Notes (Signed)
Speech Language Pathology Treatment: Dysphagia  Patient Details Name: Walter Vargas MRN: [id number] DOB: February 25, 1946 Today's Date: 03/08/2023 Time: 5621-3086 SLP Time Calculation (min) (ACUTE ONLY): 12 min  Assessment / Plan / Recommendation Clinical Impression  Walter Vargas was alert, communicative today; confusion noted. Seated in recliner visiting with a family friend. Voice was strong/ good quality. He was eager to participate in swallow re-assessment. Accepted ice chips, single sips then sequential sips of water without difficulty. He eagerly consumed a half container of applesauce with no impairments identified.  No s/s of aspiration.  Recommend starting a dysphagia 1 diet with thin liquids; give meds via TF or whole in puree. Anticipate we will be able to advance diet over next few days.  Pt pleased with recommendations. D/W RN. SLP will follow.   HPI HPI: 77 year old male with chronic HFrEF was initially admitted 11/30 with acute NSTEMI and acute on chronic biventricular HFrEF with moderate to severe MR, underwent CABG x  5 12/19, post-procedure complicated with cardiac stunning and failure to wean from bypass s/p with placement of Impella 5.5. 12/20 back to OR for re-exploration d/t post-op bleeding/ABLA.  Intubated 12/19-20. Cortrak 12/20.      SLP Plan  Continue with current plan of care      Recommendations for follow up therapy are one component of a multi-disciplinary discharge planning process, led by the attending physician.  Recommendations may be updated based on patient status, additional functional criteria and insurance authorization.    Recommendations  Diet recommendations: Dysphagia 1 (puree);Thin liquid Liquids provided via: Cup;Straw Medication Administration: Whole meds with puree Supervision: Staff to assist with self feeding Compensations: Minimize environmental distractions                  Oral care BID     Dysphagia, oropharyngeal phase (R13.12)      Continue with current plan of care   Walter Vargas L. Walter Frederic, MA CCC/SLP Clinical Specialist - Acute Care SLP Acute Rehabilitation Services Office number [phone]   Walter Vargas Walter Vargas  03/08/2023, 2:31 PM

## 2023-03-08 NOTE — Progress Notes (Signed)
NAME:  Walter Vargas, MRN:  [id number], DOB:  28-Dec-1945, LOS: 22 ADMISSION DATE:  02/14/2023, CONSULTATION DATE:  03/02/2023 REFERRING MD: Dorris Fetch - TCTS, CHIEF COMPLAINT: Postoperative management  History of Present Illness:  Encephalopathic, therefore HPI obtained from EMR review.  48 yoM with PMH significant for HTN, MR, HFrEF, and CAD admitted to [facility] 11/30 with acute on chronic HFrEF and NSTEMI.  Found to be in acute Biventricular HF with moderate to severe MR.  Treated with IV heparin, diuresed and transferred to [facility] for CABG evaluation on 12/4, admitted to HF team. Additionally tested positive for COVID 12/7, s/p completion of paxlovid with hospitalization complicated by intermittent delirium.  Underwent CABG x 5 on 12/19 by Dr. Dorris Fetch, complicated by cardiac stunning and failure to wean from bypass s/p with placement of Impella 5.5.  On return to ICU, pt coagulopathic with high CT output, VT event w/suction alarm, given multiple blood products, requiring return to OR for re-exploration without obvious source found, suspected due to coagulopathy.  PCCM consulted to assist with vent and medical management postop.   Pertinent Medical History:  HTN, MR, HFrEF, CAD  Significant Hospital Events: Including procedures, antibiotic start and stop dates in addition to other pertinent events   12/20 - PCCM consulted. 12/25 tmax 100.8, WBC up, pressor up, zosyn started, down to P6, worsening agitation needing precedex, lasix gtt stopped  Interim History / Subjective:  Worsening agitation and aggression yest requiring precedex, improved this am, weaned down to dex 0.1 Tmax 99.8, WBC stable NE 10 mcg (started to wean down), milrinone 0.3 Coox 67%, CVP 2-4 Impella remains P6, flow 3.8L  Objective:  Blood pressure 116/85, pulse 95, temperature 99.8 F (37.7 C), temperature source Axillary, resp. rate 17, height 5\' 8"  (1.727 m), weight 62.1 kg, SpO2 97%. CVP:  [0 mmHg-11 mmHg] 4 mmHg       Intake/Output Summary (Last 24 hours) at 03/08/2023 0715 Last data filed at 03/08/2023 0700 Gross per 24 hour  Intake 3161.25 ml  Output 3120 ml  Net 41.25 ml   Filed Weights   03/06/23 1100 03/07/23 0500 03/08/23 0422  Weight: 64.2 kg 63.6 kg 62.1 kg   Physical Examination: General:  AoC ill appearing elderly male sitting upright in bed in NAD HEENT: MM pink/dry, pupils 4/r Neuro: Awakens to verbal, oriented to person, place, month, not year.  MAE generalized weakness CV: rr, SR, mechanical hum, R ax impella  PULM:  non labored, scattered rales on right, diminished bases GI: soft, bs+, NT, purwick- yellow urine Extremities: warm/dry, no LE edema  Skin: no rashes   Labs and images reviewed Na 132, Mag 1.8, WBC 10.7, plts 149   amiodarone 30 mg/hr (03/08/23 0700)   dexmedetomidine (PRECEDEX) IV infusion 0.1 mcg/kg/hr (03/08/23 0700)   feeding supplement (VITAL 1.5 CAL) 55 mL/hr at 03/08/23 0700   milrinone 0.3 mcg/kg/min (03/08/23 0700)   norepinephrine (LEVOPHED) Adult infusion 10 mcg/min (03/08/23 0700)   piperacillin-tazobactam (ZOSYN)  IV Stopped (03/08/23 0540)   sodium bicarbonate 25 mEq (Impella PURGE) in dextrose 5 % 1000 mL bag     UOP 2.8L/ 24hrs +35ml Net +1.5L Wt 63.6> 62.1  Resolved Hospital Problem list    Assessment & Plan:  Multivessel CAD s/p CABG x5 Acute NSTEMI Acute on chronic biventricular HFrEF with cardiogenic shock status post Impella 5.5 Expected post-operative ABLA with coagulopathy  TEE EF 20%, RV mildly reduced 12/23 echo severe biventricular dysfunction, LVEF 25%, RV moderately down, impella well positioned P:  Per  TCTS and AHF team Impella per AHF Stable vasopressor requirements Abx as below for possible infectious resp component Milrinone per AHF Trending coox/ cvp Lasix gtt held 12/25 with low CVP and increased vasopressor requirement Cont IV amio, remains NSR ASA/ statin SSI prn  Multimodal pain management w/ bowel  regimen Hgb 10.8> 9.9, Plts better.  lovenox restarted 12/25.  No s/s bleeding, monior   Acute respiratory failure with hypoxia Probable aspiration pneumonia vs atelectasis with pleural effusions  CXR 12/26 with pleural effusions, atelectasis Continue to titrate oxygen with O2 sat goal 92% Cont pulm aggressive hygiene, IS/ flutter, add guaifenesin, mobilize as tolerated Holding diuresis  Repeat PCT Follow sputum culture Day 2 of empiric zosyn Trend WBC/ fever curve TF per cortrak Prn CXR  Hypovolemic hyponatremia Stable, trend BMET  COVID+ completed Paxlovid treatment 12/12  Delirium 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 Cont to wean precedex Minimize sedating meds Delirium precautions Tx possible infectious source as above Continue Seroquel 25 mg nightly  Deconditioning PT/ OT> OOB/ ambulate as able (very  weak getting out of bed 12/25 but did ambulate with PT/ AHF team 12/24)  Best Practice (right click and "Reselect all SmartList Selections" daily)   Diet/type: Tube feeds per cortrak DVT prophylaxis SCD; lovenox restarted 12/25 Pressure ulcer(s): N/A GI prophylaxis: PPI Lines: Central line, Arterial Line, and yes and it is still needed Foley:  Yes, and it is still needed Code Status:  full code Last date of multidisciplinary goals of care discussion [per primary team]  Critical care time: 33 mins     Posey Boyer, MSN, AG-ACNP-BC [facility] 03/08/2023, 7:15 AM  See Amion for pager If no response to pager , please call [phone] until 7pm After 7:00 pm call Elink  [phone]

## 2023-03-09 DIAGNOSIS — T8111XA Postprocedural  cardiogenic shock, initial encounter: Secondary | ICD-10-CM

## 2023-03-09 LAB — BASIC METABOLIC PANEL
Anion gap: 9 (ref 5–15)
BUN: 20 mg/dL (ref 8–23)
CO2: 24 mmol/L (ref 22–32)
Calcium: 7.8 mg/dL — ABNORMAL LOW (ref 8.9–10.3)
Chloride: 98 mmol/L (ref 98–111)
Creatinine, Ser: 0.63 mg/dL (ref 0.61–1.24)
GFR, Estimated: >60 mL/min (ref >60)
Glucose, Bld: 118 mg/dL — ABNORMAL HIGH (ref 70–99)
Potassium: 4.3 mmol/L (ref 3.5–5.1)
Sodium: 131 mmol/L — ABNORMAL LOW (ref 135–145)

## 2023-03-09 LAB — CBC
HCT: 27.6 % — ABNORMAL LOW (ref 39.0–52.0)
Hemoglobin: 9.1 g/dL — ABNORMAL LOW (ref 13.0–17.0)
MCH: 29.1 pg (ref 26.0–34.0)
MCHC: 33 g/dL (ref 30.0–36.0)
MCV: 88.2 fL (ref 80.0–100.0)
Platelets: 143 10*3/uL — ABNORMAL LOW (ref 150–400)
RBC: 3.13 MIL/uL — ABNORMAL LOW (ref 4.22–5.81)
RDW: 16.3 % — ABNORMAL HIGH (ref 11.5–15.5)
WBC: 8 10*3/uL (ref 4.0–10.5)
nRBC: 0.3 % — ABNORMAL HIGH (ref 0.0–0.2)

## 2023-03-09 LAB — COOXEMETRY PANEL
Carboxyhemoglobin: 2.5 % — ABNORMAL HIGH (ref 0.5–1.5)
Methemoglobin: 0.8 % (ref 0.0–1.5)
O2 Saturation: 59.7 %
Total hemoglobin: 9.5 g/dL — ABNORMAL LOW (ref 12.0–16.0)

## 2023-03-09 LAB — GLUCOSE, CAPILLARY
Glucose-Capillary: 125 mg/dL — ABNORMAL HIGH (ref 70–99)
Glucose-Capillary: 119 mg/dL — ABNORMAL HIGH (ref 70–99)
Glucose-Capillary: 91 mg/dL (ref 70–99)
Glucose-Capillary: 59 mg/dL — ABNORMAL LOW (ref 70–99)
Glucose-Capillary: 138 mg/dL — ABNORMAL HIGH (ref 70–99)
Glucose-Capillary: 82 mg/dL (ref 70–99)
Glucose-Capillary: 140 mg/dL — ABNORMAL HIGH (ref 70–99)
Glucose-Capillary: 142 mg/dL — ABNORMAL HIGH (ref 70–99)

## 2023-03-09 LAB — MAGNESIUM: Magnesium: 1.8 mg/dL (ref 1.7–2.4)

## 2023-03-09 LAB — PROCALCITONIN: Procalcitonin: 0.92 ng/mL

## 2023-03-09 MED ORDER — DEXTROSE 50 % IV SOLN
25.0000 mL | Freq: Once | INTRAVENOUS | Status: AC
  Administered 2023-03-09: 25 mL via INTRAVENOUS
  Filled 2023-03-09: qty 50

## 2023-03-09 MED ORDER — POTASSIUM CHLORIDE CRYS ER 20 MEQ PO TBCR: 40.0000 meq | EXTENDED_RELEASE_TABLET | Freq: Once | ORAL | Status: DC

## 2023-03-09 MED ORDER — VITAL 1.5 CAL PO LIQD
660.0000 mL | ORAL | Status: DC
Start: 2023-03-09 — End: 2023-03-20
  Administered 2023-03-09 – 2023-03-19 (×11): 660 mL
  Filled 2023-03-09 (×9): qty 1000
  Filled 2023-03-09: qty 711
  Filled 2023-03-09 (×7): qty 1000

## 2023-03-09 MED ORDER — MAGNESIUM SULFATE 2 GM/50ML IV SOLN
2.0000 g | Freq: Once | INTRAVENOUS | Status: AC
  Administered 2023-03-09: 2 g via INTRAVENOUS
  Filled 2023-03-09: qty 50

## 2023-03-09 MED ORDER — SPIRONOLACTONE 25 MG PO TABS
25.0000 mg | ORAL_TABLET | Freq: Every day | ORAL | Status: DC
  Administered 2023-03-09 – 2023-03-11 (×3): 25 mg via ORAL
  Filled 2023-03-09 (×3): qty 1

## 2023-03-09 MED ORDER — FUROSEMIDE 10 MG/ML IJ SOLN
4.0000 mg/h | INTRAVENOUS | Status: DC
  Administered 2023-03-09 – 2023-03-10 (×2): 4 mg/h via INTRAVENOUS
  Filled 2023-03-09 (×3): qty 20

## 2023-03-09 MED ORDER — INSULIN ASPART 100 UNIT/ML IJ SOLN
0.0000 [IU] | INTRAMUSCULAR | Status: DC
  Administered 2023-03-09 – 2023-03-11 (×5): 2 [IU] via SUBCUTANEOUS

## 2023-03-09 MED ORDER — ENSURE ENLIVE PO LIQD
237.0000 mL | Freq: Two times a day (BID) | ORAL | Status: DC
  Administered 2023-03-09 – 2023-03-13 (×5): 237 mL via ORAL

## 2023-03-09 MED ORDER — FUROSEMIDE 10 MG/ML IJ SOLN
60.0000 mg | Freq: Once | INTRAMUSCULAR | Status: AC
  Administered 2023-03-09: 60 mg via INTRAVENOUS
  Filled 2023-03-09: qty 6

## 2023-03-09 MED ORDER — FUROSEMIDE 10 MG/ML IJ SOLN
60.0000 mg | Freq: Once | INTRAMUSCULAR | Status: DC
Start: 2023-03-09 — End: 2023-03-09
  Filled 2023-03-09: qty 6

## 2023-03-09 MED ORDER — POTASSIUM CHLORIDE 20 MEQ PO PACK
40.0000 meq | PACK | Freq: Once | ORAL | Status: AC
  Administered 2023-03-09: 40 meq
  Filled 2023-03-09: qty 2

## 2023-03-09 NOTE — Progress Notes (Addendum)
Advanced Heart Failure Rounding Note  PCP-Cardiologist: None  Chief Complaint: Cardiogenic Shock  Subjective:    12/19: S/p CABG and Impella 5.5 Insertion. Coagulopathic on return from OR, high chest tube output, required multiple blood products. Subsequent VT associated w/ Impella suction alarms. Back to OR for re-exploratation, bleeding from EPW.   12/23: Bedside echo severe biventricular dysfunction. LVEF 25% RV moderately down. Impella well positioned  12/25: started on Zosyn for fevers/suspected aspiration PNA. PCT 0.34   POD# 8 OOB, sitting up in chair. Not agitated this morning. Cooperative. Denies CP and dyspnea. Eager to go home.    Impella at P-5, Flows 3.4L   Remains on milrinone 0.375. Off NE   Co-ox 60%. CVP 3 but LVEDP off Impella measuring 24.  Remains ~14 lb above pre-op wt. SCr stable 0.63.  Continues on Zosyn. Fevers resolved. O2 sats 95% on RA.   Meds:   aspirin  81 mg Oral Daily   atorvastatin  80 mg Oral Daily   bisacodyl  10 mg Oral Daily   Or   bisacodyl  10 mg Rectal Daily   Chlorhexidine Gluconate Cloth  6 each Topical Daily   docusate  200 mg Oral Daily   enoxaparin (LOVENOX) injection  40 mg Subcutaneous Q24H   feeding supplement (PROSource TF20)  60 mL Per Tube Daily   guaiFENesin  15 mL Oral Q6H   insulin aspart  0-15 Units Subcutaneous Q4H   mouth rinse  15 mL Mouth Rinse QID   pantoprazole (PROTONIX) IV  40 mg Intravenous QHS   QUEtiapine  25 mg Oral QHS   spironolactone  25 mg Oral Daily   thiamine  100 mg Oral Daily    amiodarone 30 mg/hr (03/09/23 0900)   feeding supplement (VITAL 1.5 CAL) Stopped (03/09/23 0725)   milrinone 0.3 mcg/kg/min (03/09/23 1008)   norepinephrine (LEVOPHED) Adult infusion Stopped (03/09/23 0209)   piperacillin-tazobactam (ZOSYN)  IV 3.375 g (03/09/23 1008)   sodium bicarbonate 25 mEq (Impella PURGE) in dextrose 5 % 1000 mL bag       Objective:   Weight Range: 61.4 kg Body mass index is 20.58  kg/m.   Vital Signs:   Temp:  [97.7 F (36.5 C)-98.7 F (37.1 C)] 97.7 F (36.5 C) (12/27 0800) Pulse Rate:  [78-96] 83 (12/27 0900) Resp:  [16-33] 20 (12/27 0900) BP: (89-120)/(63-80) 105/69 (12/27 0500) SpO2:  [81 %-100 %] 93 % (12/27 0900) Arterial Line BP: (93-231)/(48-226) 96/54 (12/27 0900) Weight:  [61.4 kg] 61.4 kg (12/27 0500) Last BM Date : 03/08/23  Weight change: Filed Weights   03/07/23 0500 03/08/23 0422 03/09/23 0500  Weight: 63.6 kg 62.1 kg 61.4 kg   Intake/Output:   Intake/Output Summary (Last 24 hours) at 03/09/2023 0908 Last data filed at 03/09/2023 0900 Gross per 24 hour  Intake 2676.84 ml  Output 2085 ml  Net 591.84 ml    Physical Exam   General:  thin elderly male, sitting up in chair. No respiratory difficulty HEENT: normal + cor trak  Neck: supple. no JVD. Rt subclavian Impella 5.5 (site ok) Carotids 2+ bilat; no bruits. No lymphadenopathy or thyromegaly appreciated. Cor: PMI nondisplaced. Regular rate & rhythm. No rubs, gallops or murmurs. Lungs: decreased BS at the bases bilaterally  Abdomen: soft, nontender, nondistended. No hepatosplenomegaly. No bruits or masses. Good bowel sounds. Extremities: no cyanosis, clubbing, rash, edema Neuro: alert & oriented x 3, cranial nerves grossly intact. moves all 4 extremities w/o difficulty. Affect pleasant.  Telemetry   NSR 70-80s Personally reviewed  Patient Profile   Walter Vargas is a 77 y.o. history of HTN, Walter, HFrEF, and severe 3V CAD. S/p CABG and Impella 5.5 Insertion 12/19 w Dr. Dorris Fetch.   Assessment/Plan   Cardiogenic Postoperative Shock - Biventricular failure at b/l. Pre CABG echo EF < 20%, RV moderately reduced (PAPi was adequate on RHC) - Intra-op course complicated by cardiac stun and failure to wean from bypass. Impella 5.5 placed  - intra op TEE EF 20%, RV mildly reduced  - POCUS 12/25 EF 25-30% RV improved. Impella well positioned - c/w Impella 5.5 @ P5. Flow 3.4L No alarms.   - on Milrinone 0.3. Off NE. Co-ox 60%   - volume status still elevated, remains up from post op wt, LVEDP 24. Continue diuresis w/ IV Lasix - aim to get Impella out today, will d/w Dr. Gala Romney   2. CAD: Patient admitted with NSTEMI (?culprit D2) but with severe extensive diffuse 3 vessel disease.  Disease too extensive to treat percutaneously.  Anatomy felt best served by CABG. - s/p CABG and Impella 5.5 insertion w Dr. Dorris Fetch 12/19. - Continue ASA 81  - Continue atorva 80 - No s/s angina. No b-blokcer yet with shock   3. Acute on chronic systolic CHF: Patient has had HF symptoms for at least a couple of months.  Suspect pre-existing ischemic cardiomyopathy then he presented this admission with NSTEMI possible in D2 territory leading to CHF exacerbation. RHC 12/3 showed normal filling pressures and low CI at 2.0. Echo 12/4: EF 20%, RV mild-moderately dysfunctional, mild-moderate Walter.  - continue milrinone support  - aim to remote Impella today - continue IV Lasix per above  - increase spiro to 25 mg daily  - will aim to start Entresto as we wean down milrinone   4. Post-Operative Bleeding/ ABLA - coagulopathic on return from OR - Supported with multiple units of blood products including RBCs, cryo, FFP and Factor VII + albumin and protamine, had to return to the OR. Bleeding noted around chest tubes - now stable. CTs pulled - Hgb stable at 9.1 - continue to monitor closely   5. 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  Patient has had on and off confusion in hospital.  - labile, improved some today   6. Fevers/ Suspected Aspiration PNA - PCT 0.34 - Zosyn started 12/25 - fevers resolved - abx duration per CCM   7. COVID+: Completed Paxlovid  8. 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  9. Acute Hypoxic Respiratory Failure - now extubated,  stable on RA  - diuretics/ abx per above        Length of Stay: [address] PA-C  03/09/2023, 9:08 AM  Advanced Heart Failure Team Pager [phone] (M-F; 7a - 5p)  Please contact [facility] for night-coverage after hours (5p -7a ) and weekends on [email or web address]  Agree with above.   More alert today. Remains on Impella 5.5 at P.5 with good flows. Also on milrinone 0.3. Co-ox 60%  Denies CP or SOB,   Weight still up 14 pounds post-op CVP   General:  Sitting in chair No resp difficulty HEENT: normal Neck: supple. + TLC Carotids 2+ bilat; no bruits. No lymphadenopathy or thryomegaly appreciated. Cor: Impella site ok. Sternal wound ok Regular rate & rhythm. No rubs, gallops or murmurs. Lungs: clear Abdomen: soft, nontender,  nondistended. No hepatosplenomegaly. No bruits or masses. Good bowel sounds. Extremities: no cyanosis, clubbing, rash, 1+ edema Neuro: alert & orientedx3, cranial nerves grossly intact. moves all 4 extremities w/o difficulty. Affect pleasant  Improving slowly. Remains on Impella at P-5. Attempting to wean but LVEDP 24 on P-5 and increases to 31 on P-4. Will diurese further today and recheck in the afternoon to see if we can remove Impella. Hopefully later today but may need another day of volume removal. Follow closely. D/w Dr. Dorris Fetch.  CRITICAL CARE Performed by: Walter Vargas  Total critical care time: 35 minutes  Critical care time was exclusive of separately billable procedures and treating other patients.  Critical care was necessary to treat or prevent imminent or life-threatening deterioration.  Critical care was time spent personally by me (independent of midlevel providers or residents) on the following activities: development of treatment plan with patient and/or surrogate as well as nursing, discussions with consultants, evaluation of patient's response to treatment, examination of patient, obtaining history from patient or  surrogate, ordering and performing treatments and interventions, ordering and review of laboratory studies, ordering and review of radiographic studies, pulse oximetry and re-evaluation of patient's condition.  Walter Meres, MD  11:23 AM

## 2023-03-09 NOTE — Progress Notes (Signed)
Hypoglycemic Event  CBG: 59  Treatment: D50 25 mL (12.5 gm)  Symptoms: Nervous/irritable  Follow-up CBG: 138  Possible Reasons for Event: Other: Tube Feeds held this AM for possible OR this afternoon, pt NPO.  Patient not going to OR this afternoon, previous diet reordered by Dr Dorris Fetch. Tube feeds switched to 12hr/nocturnal feeds.   [employer]

## 2023-03-09 NOTE — H&P (View-Only) (Signed)
      [address]       [name] [address]             [phone]      Diuresing reasonably well with Lasix and spironolactone  LVEDP still 22-25  Will plan to continue diuresis over the weekend and try to remove Impella Monday  Resume D1 diet and will resume tube feedings at 7 PM  Walter Vargas [name], MD [facility] [phone]

## 2023-03-09 NOTE — Progress Notes (Signed)
SLP Cancellation Note  Patient Details Name: Walter Vargas MRN: [id number] DOB: 08/04/1945   Cancelled treatment:       Reason Eval/Treat Not Completed: Patient NPO for procedure. SLP will follow. Please ensure that he is placed back on dys1/thin liquids s/p procedure.  Quinnley Colasurdo L. Samson Frederic, MA CCC/SLP Clinical Specialist - Acute Care SLP Acute Rehabilitation Services Office number [phone]    Blenda Mounts Laurice 03/09/2023, 10:13 AM

## 2023-03-09 NOTE — Progress Notes (Signed)
NAME:  Walter Vargas, MRN:  [id number], DOB:  1945/10/04, LOS: 23 ADMISSION DATE:  02/14/2023, CONSULTATION DATE:  03/02/2023 REFERRING MD: Dorris Fetch - TCTS, CHIEF COMPLAINT: Postoperative management  History of Present Illness:  Encephalopathic, therefore HPI obtained from EMR review.  20 yoM with PMH significant for HTN, MR, HFrEF, and CAD admitted to [facility] 11/30 with acute on chronic HFrEF and NSTEMI.  Found to be in acute Biventricular HF with moderate to severe MR.  Treated with IV heparin, diuresed and transferred to [facility] for CABG evaluation on 12/4, admitted to HF team. Additionally tested positive for COVID 12/7, s/p completion of paxlovid with hospitalization complicated by intermittent delirium.  Underwent CABG x 5 on 12/19 by Dr. Dorris Fetch, complicated by cardiac stunning and failure to wean from bypass s/p with placement of Impella 5.5.  On return to ICU, pt coagulopathic with high CT output, VT event w/suction alarm, given multiple blood products, requiring return to OR for re-exploration without obvious source found, suspected due to coagulopathy.  PCCM consulted to assist with vent and medical management postop.   Pertinent Medical History:  HTN, MR, HFrEF, CAD  Significant Hospital Events: Including procedures, antibiotic start and stop dates in addition to other pertinent events   12/20 - PCCM consulted. 12/25 tmax 100.8, WBC up, pressor up, zosyn started, down to P6, worsening agitation needing precedex, lasix gtt stopped  Interim History / Subjective:    amiodarone 30 mg/hr (03/09/23 0600)   dexmedetomidine (PRECEDEX) IV infusion Stopped (03/08/23 0833)   feeding supplement (VITAL 1.5 CAL) 55 mL/hr at 03/09/23 0600   milrinone 0.3 mcg/kg/min (03/09/23 0600)   norepinephrine (LEVOPHED) Adult infusion Stopped (03/09/23 0209)   piperacillin-tazobactam (ZOSYN)  IV 12.5 mL/hr at 03/09/23 0600   sodium bicarbonate 25 mEq (Impella PURGE) in dextrose 5 % 1000 mL bag      No  events, off precedex. Impella P5  Objective:  Blood pressure 105/69, pulse 83, temperature 98.6 F (37 C), temperature source Oral, resp. rate (!) 29, height 5\' 8"  (1.727 m), weight 61.4 kg, SpO2 92%. CVP:  [0 mmHg-49 mmHg] 3 mmHg      Intake/Output Summary (Last 24 hours) at 03/09/2023 0709 Last data filed at 03/09/2023 0600 Gross per 24 hour  Intake 2662.54 ml  Output 2320 ml  Net 342.54 ml   Filed Weights   03/07/23 0500 03/08/23 0422 03/09/23 0500  Weight: 63.6 kg 62.1 kg 61.4 kg   Physical Examination: No distress Sternotomy looks good Impella RP looks okay Small amount of blood near cortrak in nostril MM dry Ext warm Breath sounds diminished, nonlabored breathing pattern RA Aox3  CBG okay CBC stable Renal function stable Pct up? CXR improving edema pattern  Resolved Hospital Problem list    Assessment & Plan:  Multivessel CAD s/p CABG x5 Acute NSTEMI Acute on chronic biventricular HFrEF with cardiogenic shock status post Impella 5.5 Expected post-operative ABLA with coagulopathy  TEE EF 20%, RV mildly reduced 12/23 echo severe biventricular dysfunction, LVEF 25%, RV moderately down, impella well positioned P:  GDMT when BP allows Diuresis and impella wean per TCTS/AHF  Acute respiratory failure with hypoxia Probable aspiration pneumonia vs atelectasis with pleural effusions  Improved 5 days zosyn Diuresis Encourage IS, OOB  COVID+ completed Paxlovid treatment 12/12  Delirium Seroquel at bedtime Encourage day/night cycles, reorient PRN  Deconditioning PT/ OT  Dysphagia, poor PO Cortrak to PO conversion as able  Best Practice (right click and "Reselect all SmartList Selections" daily)   Diet/type: Tube  feeds per cortrak DVT prophylaxis SCD; lovenox restarted 12/25 Pressure ulcer(s): N/A GI prophylaxis: PPI Lines: Central line, Arterial Line, and yes and it is still needed Foley:  Yes, and it is still needed Code Status:  full code Last  date of multidisciplinary goals of care discussion [per primary team]  Myrla Halsted MD PCCM

## 2023-03-09 NOTE — Progress Notes (Signed)
Nutrition Follow-up  DOCUMENTATION CODES:   Severe malnutrition in context of chronic illness  INTERVENTION:   Tube Feeding via Cortrak: Changed to 12 hour nocturnal TF Vital 1.5 at 55 ml/hr x 12 hours TF provides 990 kcals, 45 g of protein and 502 mL of free water  Ensure Enlive po BID between meals, each supplement provides 350 kcal and 20 grams of protein.  Magic cup BID at lunch and dinner, each supplement provides 290 kcal and 9 grams of protein   NUTRITION DIAGNOSIS:   Severe Malnutrition related to chronic illness (HFrEF, 3V CAD, MR) as evidenced by severe fat depletion, severe muscle depletion.  Being addressed via diet, oral nutrition supplements and supplemental TF  GOAL:   Patient will meet greater than or equal to 90% of their needs  Progressing  MONITOR:   PO intake, Supplement acceptance, TF tolerance, Skin, Weight trends, Labs  REASON FOR ASSESSMENT:   Ventilator    ASSESSMENT:   Pt remains admitted with heart failure, NSTEMI and cardiogenic shock. PMH significant for HTN, MR, HFrEF and severe 3V CAD.  11/30: admitted 12/19: s/p CABG and impella 5/5 placement 12/20: back to OR for re-exploration d/t post-op bleeding/ABLA; cortrak placed, trickle TF initiated, extubated 12/26 Diet advanced to Dysphagia 1, Thins per SLP 12/27 Changed to nocturnal TF  NPO this AM with TF on hold for possible Impella removal today but now plan to continue diuresis over the weekend with possible Impella removal Monday.   Noted diet advanced to Dysphagia 1/Thins yesterday but no intake recorded; unsure how much, if any, pt ate yesterday.   Noted MD requesting TF be changed to nocturnal; RD to modify to 12 hours feedings and add oral nutrition supplements.   Weight trending back down with diuresis; current wt 61.4 kg. Lowest wt 49 kg with admit wt 56 kg. Still with significant edema. UOP around 2L in 24 hours; 900 mL thus far today  Labs: sodium 131 (L), BUN/Creatinine  wdl Meds: colace, lasix, ss novolog, thiamine   Diet Order:   Diet Order             DIET - DYS 1 Room service appropriate? Yes; Fluid consistency: Thin  Diet effective now                   EDUCATION NEEDS:   No education needs have been identified at this time  Skin:  Skin Assessment: Skin Integrity Issues: Skin Integrity Issues:: Incisions Incisions: Chest, R leg inicision closed  Last BM:  12/26 type 6/7, noted FMS removed  Height:   Ht Readings from Last 1 Encounters:  03/01/23 5\' 8"  (1.727 m)    Weight:   Wt Readings from Last 1 Encounters:  03/09/23 61.4 kg     BMI:  Body mass index is 20.58 kg/m.  Estimated Nutritional Needs:   Kcal:  1900-2200 kcals  Protein:  90-110 g  Fluid:  1.7L   Romelle Starcher MS, RDN, LDN, CNSC Registered Dietitian 3 Clinical Nutrition RD Inpatient Contact Info in Amion

## 2023-03-09 NOTE — Progress Notes (Signed)
      [address]       [name] [address]             [phone]      Diuresing reasonably well with Lasix and spironolactone  LVEDP still 22-25  Will plan to continue diuresis over the weekend and try to remove Impella Monday  Resume D1 diet and will resume tube feedings at 7 PM  Walter Mckillop [name], MD [facility] [phone]

## 2023-03-09 NOTE — Plan of Care (Signed)
  Problem: Health Behavior/Discharge Planning: Goal: Ability to manage health-related needs will improve Outcome: Progressing  Patient remained calm and cooperative  Problem: Clinical Measurements: Goal: Ability to maintain clinical measurements within normal limits will improve Outcome: Progressing Goal: Respiratory complications will improve Outcome: Progressing  Downward titration of O2 LPM Goal: Cardiovascular complication will be avoided Outcome: Progressing

## 2023-03-09 NOTE — Inpatient Diabetes Management (Signed)
Inpatient Diabetes Program Recommendations  AACE/ADA: New Consensus Statement on Inpatient Glycemic Control (2015)  Target Ranges:  Prepandial:   less than 140 mg/dL      Peak postprandial:   less than 180 mg/dL (1-2 hours)      Critically ill patients:  140 - 180 mg/dL   Lab Results  Component Value Date   GLUCAP 138 (H) 03/09/2023   HGBA1C 5.9 (H) 02/28/2023    Review of Glycemic Control  Latest Reference Range & Units 03/08/23 07:45 03/08/23 11:18 03/08/23 16:27 03/08/23 21:24 03/09/23 04:05 03/09/23 07:32 03/09/23 12:00 03/09/23 13:12  Glucose-Capillary 70 - 99 mg/dL 409 (H) 90 79 811 (H)  Novolog 2 units  119 (H) 91 59 (L) 138 (H)  (H): Data is abnormally high (L): Data is abnormally low  Diabetes history: Pre-DM Outpatient Diabetes medications: None Current orders for Inpatient glycemic control: Novolog 0-15 units Q4H  Inpatient Diabetes Program Recommendations:  Noted tube feeds on hold for possible OR.  Was 59 mg/dL at noon today.  Please decrease correction scale:  Novolog 0-9 units Q4H.  Will continue to follow while inpatient.  Thank you, Dulce Sellar, MSN, CDCES Diabetes Coordinator Inpatient Diabetes Program [phone] (team pager from 8a-5p)

## 2023-03-09 NOTE — Progress Notes (Addendum)
TCTS DAILY ICU PROGRESS NOTE                   [address]            [place] [id number]          [phone]   8 Days Post-Op Procedure(s) (LRB): EXPLORATION POST OPERATIVE OPEN HEART (N/A)  Total Length of Stay:  LOS: 23 days   Subjective: Up in the bedside chair. He is alert, calm and cooperative.    Milrinone at 0.1mcg/kg/min NorEpi is off Amiodarone at 30mg /hr  Impella at P-5, CoOx pending   Objective: Vital signs in last 24 hours: Temp:  [97.6 F (36.4 C)-98.7 F (37.1 C)] 98.6 F (37 C) (12/27 0311) Pulse Rate:  [78-96] 82 (12/27 0700) Cardiac Rhythm: Normal sinus rhythm;Bundle branch block (12/26 2100) Resp:  [16-33] 27 (12/27 0700) BP: (89-120)/(63-80) 105/69 (12/27 0500) SpO2:  [81 %-100 %] 97 % (12/27 0700) Arterial Line BP: (93-231)/(48-226) 106/57 (12/27 0700) Weight:  [61.4 kg] 61.4 kg (12/27 0500)  Filed Weights   03/07/23 0500 03/08/23 0422 03/09/23 0500  Weight: 63.6 kg 62.1 kg 61.4 kg    Weight change: -0.7 kg   Hemodynamic parameters for last 24 hours: CVP:  [0 mmHg-49 mmHg] 1 mmHg  Intake/Output from previous day: 12/26 0701 - 12/27 0700 In: 2672 [P.O.:100; I.V.:722.1; NG/GT:1415; IV Piggyback:148.9] Out: 2320 [Urine:1975; Stool:345]  Intake/Output this shift: No intake/output data recorded.  Current Meds: Scheduled Meds:  aspirin  81 mg Oral Daily   atorvastatin  80 mg Oral Daily   bisacodyl  10 mg Oral Daily   Or   bisacodyl  10 mg Rectal Daily   Chlorhexidine Gluconate Cloth  6 each Topical Daily   docusate  200 mg Oral Daily   enoxaparin (LOVENOX) injection  40 mg Subcutaneous Q24H   feeding supplement (PROSource TF20)  60 mL Per Tube Daily   guaiFENesin  15 mL Oral Q6H   insulin aspart  0-15 Units Subcutaneous Q4H   mouth rinse  15 mL Mouth Rinse QID   pantoprazole (PROTONIX) IV  40 mg Intravenous QHS   QUEtiapine  25 mg Oral QHS   spironolactone  12.5 mg Oral Daily   thiamine  100 mg Oral Daily    Continuous Infusions:  amiodarone 30 mg/hr (03/09/23 0600)   feeding supplement (VITAL 1.5 CAL) 55 mL/hr at 03/09/23 0600   magnesium sulfate bolus IVPB     milrinone 0.3 mcg/kg/min (03/09/23 0600)   norepinephrine (LEVOPHED) Adult infusion Stopped (03/09/23 0209)   piperacillin-tazobactam (ZOSYN)  IV 12.5 mL/hr at 03/09/23 0600   sodium bicarbonate 25 mEq (Impella PURGE) in dextrose 5 % 1000 mL bag     PRN Meds:.sodium chloride, acetaminophen (TYLENOL) oral liquid 160 mg/5 mL, diphenhydrAMINE, fentaNYL, morphine injection, ondansetron (ZOFRAN) IV, mouth rinse, oxyCODONE, sodium chloride  General appearance: alert, cooperative, and no distress Neurologic: calm and cooperative, no focal deficits. No signs os aspiration on swallow eval yesterday. Heart: monotor showing SR, Impella at P-5 Lungs: clear anterior, diminished both bases.  Abdomen: soft, no tenderness Extremities: well perfused no peripheral edema. Expected bruising right ankle.  Wound: the Impella insertion site and sternotomy incision are dry.    Lab Results: CBC: Recent Labs    03/08/23 0345 03/09/23 0402  WBC 10.7* 8.0  HGB 9.9* 9.1*  HCT 29.5* 27.6*  PLT 149* 143*   BMET:  Recent Labs    03/08/23 0345 03/08/23 1813  NA 132* 133*  K 4.0 4.2  CL 99 100  CO2 24 24  GLUCOSE 163* 136*  BUN 17 22  CREATININE 0.63 0.68  CALCIUM 7.9* 8.0*    CMET: Lab Results  Component Value Date   WBC 8.0 03/09/2023   HGB 9.1 (L) 03/09/2023   HCT 27.6 (L) 03/09/2023   PLT 143 (L) 03/09/2023   GLUCOSE 136 (H) 03/08/2023   CHOL 90 02/11/2023   TRIG 45 02/11/2023   HDL 38 (L) 02/11/2023   LDLCALC 43 02/11/2023   ALT 11 03/03/2023   AST 39 03/03/2023   NA 133 (L) 03/08/2023   K 4.2 03/08/2023   CL 100 03/08/2023   CREATININE 0.68 03/08/2023   BUN 22 03/08/2023   CO2 24 03/08/2023   TSH 4.481 02/11/2023   PSA 0.74 06/21/2022   INR 1.6 (H) 03/03/2023   HGBA1C 5.9 (H) 02/28/2023      PT/INR: No results for  input(s): "LABPROT", "INR" in the last 72 hours. Radiology: No results found.   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  Hemodynamics slowly improving, now off NE,  CoOx pending.  Will discuss timing of Impella removal with  the HF team. Will hold TF and keep NPO this morning until decision is made.   -PULM- On Zosyn per CCM for suspected aspiration pneumonia. Normal work of breathing on RA currently, SaO2 95%.  Diuresing as BP allows, encouraging pulm hygiene.  COVID positive on admission screening, received Paxlovid course prior to surgery.    -GI- receiving TF at goal per small-bore NG.  No sign of aspiration on swallow eval yesterday, ordered dysphagia I diet. Passing stool.  NPO and TF stopped for now. Will transition to nocturnal feeding when resumed.   -NEURO- intermittent confusion before and after surgery.  He is calm and cooperative now off of Precedex. On daily Seroquel.  -RENAL- normal function, Wt is ~6kg+ if accurate.  Diuretic mgt per HF team.  Replacing Mg++.  -HEME- expected acute blood loss anemia and thrombocytopenia. Counts stable.   -Left carotid stenosis- seen on pre-op screening duplex.  Evaluated by Dr. Hetty Blend ([facility]) before CABG.  TCAR vs CEA planned after recovery from CABG.   -DVT PPX- on enoxaparin, ambulating.     Leary Roca, PA-C [phone] 03/09/2023 7:29 AM  Patient seen and examined, agree with findings and plan outlined above Looks better this AM Up in chair, alert and appropriate Co-ox pending but #s look good on p5 with 3.5 L/min flow Will hold Tf and make NPO in case we are able to try to dc Impella this afternoon Will d/w Dr. Lenis Dickinson [name], MD [facility] [phone]

## 2023-03-10 ENCOUNTER — Other Ambulatory Visit: Payer: Self-pay

## 2023-03-10 LAB — CBC
HCT: 26.7 % — ABNORMAL LOW (ref 39.0–52.0)
Hemoglobin: 8.7 g/dL — ABNORMAL LOW (ref 13.0–17.0)
MCH: 28.8 pg (ref 26.0–34.0)
MCHC: 32.6 g/dL (ref 30.0–36.0)
MCV: 88.4 fL (ref 80.0–100.0)
Platelets: 223 10*3/uL (ref 150–400)
RBC: 3.02 MIL/uL — ABNORMAL LOW (ref 4.22–5.81)
RDW: 16.4 % — ABNORMAL HIGH (ref 11.5–15.5)
WBC: 9.7 10*3/uL (ref 4.0–10.5)
nRBC: 0 % (ref 0.0–0.2)

## 2023-03-10 LAB — RENAL FUNCTION PANEL
Albumin: 2.3 g/dL — ABNORMAL LOW (ref 3.5–5.0)
Anion gap: 9 (ref 5–15)
BUN: 22 mg/dL (ref 8–23)
CO2: 26 mmol/L (ref 22–32)
Calcium: 8 mg/dL — ABNORMAL LOW (ref 8.9–10.3)
Chloride: 94 mmol/L — ABNORMAL LOW (ref 98–111)
Creatinine, Ser: 0.93 mg/dL (ref 0.61–1.24)
GFR, Estimated: >60 mL/min (ref >60)
Glucose, Bld: 157 mg/dL — ABNORMAL HIGH (ref 70–99)
Phosphorus: 3.7 mg/dL (ref 2.5–4.6)
Potassium: 4 mmol/L (ref 3.5–5.1)
Sodium: 129 mmol/L — ABNORMAL LOW (ref 135–145)

## 2023-03-10 LAB — COOXEMETRY PANEL
Carboxyhemoglobin: 2.7 % — ABNORMAL HIGH (ref 0.5–1.5)
Methemoglobin: <0.7 % (ref 0.0–1.5)
O2 Saturation: 60.5 %
Total hemoglobin: 9 g/dL — ABNORMAL LOW (ref 12.0–16.0)

## 2023-03-10 LAB — LACTATE DEHYDROGENASE: LDH: 345 U/L — ABNORMAL HIGH (ref 98–192)

## 2023-03-10 LAB — GLUCOSE, CAPILLARY
Glucose-Capillary: 106 mg/dL — ABNORMAL HIGH (ref 70–99)
Glucose-Capillary: 116 mg/dL — ABNORMAL HIGH (ref 70–99)
Glucose-Capillary: 120 mg/dL — ABNORMAL HIGH (ref 70–99)
Glucose-Capillary: 129 mg/dL — ABNORMAL HIGH (ref 70–99)
Glucose-Capillary: 143 mg/dL — ABNORMAL HIGH (ref 70–99)
Glucose-Capillary: 93 mg/dL (ref 70–99)

## 2023-03-10 LAB — MAGNESIUM: Magnesium: 1.9 mg/dL (ref 1.7–2.4)

## 2023-03-10 MED ORDER — MAGNESIUM SULFATE 2 GM/50ML IV SOLN
2.0000 g | Freq: Once | INTRAVENOUS | Status: AC
Start: 2023-03-10 — End: 2023-03-10
  Administered 2023-03-10: 2 g via INTRAVENOUS
  Filled 2023-03-10: qty 50

## 2023-03-10 MED ORDER — GERHARDT'S BUTT CREAM
TOPICAL_CREAM | Freq: Every day | CUTANEOUS | Status: DC
Start: 2023-03-10 — End: 2023-03-20
  Administered 2023-03-12: 1 via TOPICAL
  Filled 2023-03-10 (×2): qty 60

## 2023-03-10 MED ORDER — DIGOXIN 125 MCG PO TABS
0.1250 mg | ORAL_TABLET | Freq: Every day | ORAL | Status: DC
  Administered 2023-03-10 – 2023-03-11 (×2): 0.125 mg via ORAL
  Filled 2023-03-10 (×2): qty 1

## 2023-03-10 MED ORDER — SODIUM CHLORIDE 0.9% FLUSH: 10.0000 mL | INTRAVENOUS | Status: DC | PRN

## 2023-03-10 MED ORDER — SODIUM CHLORIDE 0.9% FLUSH
10.0000 mL | Freq: Two times a day (BID) | INTRAVENOUS | Status: DC
  Administered 2023-03-10 – 2023-03-12 (×5): 10 mL
  Administered 2023-03-12: 20 mL
  Administered 2023-03-13 – 2023-03-16 (×7): 10 mL
  Administered 2023-03-16 – 2023-03-17 (×2): 20 mL
  Administered 2023-03-17: 10 mL
  Administered 2023-03-18: 20 mL
  Administered 2023-03-18 – 2023-03-19 (×2): 10 mL
  Administered 2023-03-19: 30 mL
  Administered 2023-03-20: 10 mL

## 2023-03-10 MED ORDER — POTASSIUM CHLORIDE 20 MEQ PO PACK
40.0000 meq | PACK | Freq: Once | ORAL | Status: AC
  Administered 2023-03-10: 40 meq
  Filled 2023-03-10: qty 2

## 2023-03-10 NOTE — Progress Notes (Signed)
RN spoke with pt son, Markham Jordan. Son updated on pt condition. RN discussed pt returning to OR Monday to remove impella. Son stated he knew of procedure and was comfortable with signing consent tomorrow morning at bedside.

## 2023-03-10 NOTE — Plan of Care (Signed)
  Problem: Health Behavior/Discharge Planning: Goal: Ability to manage health-related needs will improve Outcome: Not Progressing   

## 2023-03-10 NOTE — Progress Notes (Signed)
While reviewing patient's orders, RN noted order placed 03/03/23 to discontinue pacing wires. Pacing wires are still in place at this time. Dr. Leafy Ro made aware during evening rounds, MD stated to keep pacing wires in place at this time. RN to discontinue previous order.

## 2023-03-10 NOTE — Progress Notes (Addendum)
Patient ID: Walter Vargas, male   DOB: 06/18/1945, 77 y.o.   MRN: [id number]     Advanced Heart Failure Rounding Note  PCP-Cardiologist: None  Chief Complaint: Cardiogenic Shock  Subjective:    12/19: S/p CABG and Impella 5.5 Insertion. Coagulopathic on return from OR, high chest tube output, required multiple blood products. Subsequent VT associated w/ Impella suction alarms. Back to OR for re-exploratation, bleeding from EPW.   12/23: Bedside echo severe biventricular dysfunction. LVEF 25% RV moderately down. Impella well positioned  12/25: started on Zosyn for fevers/suspected aspiration PNA. PCT 0.34   POD# 9 Walked yesterday with PT.   Impella at P-5, Flows 3.5 L   Remains on milrinone 0.3, NE 5. MAP 69.  Co-ox 61%, CVP 3 but LVEDP 24 off Impella. Creatinine 0.93.  I/Os net negative 1383.   Continues on Zosyn. Fevers resolved.   Meds:   aspirin  81 mg Oral Daily   atorvastatin  80 mg Oral Daily   bisacodyl  10 mg Oral Daily   Or   bisacodyl  10 mg Rectal Daily   Chlorhexidine Gluconate Cloth  6 each Topical Daily   docusate  200 mg Oral Daily   enoxaparin (LOVENOX) injection  40 mg Subcutaneous Q24H   feeding supplement  237 mL Oral BID BM   feeding supplement (PROSource TF20)  60 mL Per Tube Daily   feeding supplement (VITAL 1.5 CAL)  660 mL Per Tube Q24H   guaiFENesin  15 mL Oral Q6H   insulin aspart  0-15 Units Subcutaneous Q4H   mouth rinse  15 mL Mouth Rinse QID   pantoprazole (PROTONIX) IV  40 mg Intravenous QHS   potassium chloride  40 mEq Per Tube Once   QUEtiapine  25 mg Oral QHS   spironolactone  25 mg Oral Daily   thiamine  100 mg Oral Daily    amiodarone 30 mg/hr (03/10/23 0700)   furosemide (LASIX) 200 mg in dextrose 5 % 100 mL (2 mg/mL) infusion 4 mg/hr (03/10/23 0700)   milrinone 0.3 mcg/kg/min (03/10/23 0700)   norepinephrine (LEVOPHED) Adult infusion 5 mcg/min (03/10/23 0700)   piperacillin-tazobactam (ZOSYN)  IV Stopped (03/10/23 0529)   sodium  bicarbonate 25 mEq (Impella PURGE) in dextrose 5 % 1000 mL bag       Objective:   Weight Range: 62.2 kg Body mass index is 20.85 kg/m.   Vital Signs:   Temp:  [97.7 F (36.5 C)-98.4 F (36.9 C)] 98.1 F (36.7 C) (12/28 0327) Pulse Rate:  [75-93] 90 (12/28 0700) Resp:  [17-30] 24 (12/28 0700) SpO2:  [91 %-99 %] 96 % (12/28 0700) Arterial Line BP: (86-144)/(45-100) 118/61 (12/28 0700) Weight:  [62.2 kg] 62.2 kg (12/28 0557) Last BM Date : 03/08/23  Weight change: Filed Weights   03/08/23 0422 03/09/23 0500 03/10/23 0557  Weight: 62.1 kg 61.4 kg 62.2 kg   Intake/Output:   Intake/Output Summary (Last 24 hours) at 03/10/2023 0728 Last data filed at 03/10/2023 0700 Gross per 24 hour  Intake 1957.39 ml  Output 3450 ml  Net -1492.61 ml    Physical Exam   General: NAD, thin Neck: No JVD, no thyromegaly or thyroid nodule.  Lungs: Clear to auscultation bilaterally with normal respiratory effort. CV: Nondisplaced PMI.  Heart regular S1/S2, no S3/S4, no murmur.  No peripheral edema.   Abdomen: Soft, nontender, no hepatosplenomegaly, no distention.  Skin: Intact without lesions or rashes.  Neurologic: Alert and oriented x 3.  Psych: Normal affect. Extremities:  No clubbing or cyanosis.  HEENT: Cortrack.   Telemetry   NSR 70-80s Personally reviewed  Patient Profile   Walter Vargas is a 77 y.o. history of HTN, Walter, HFrEF, and severe 3V CAD. S/p CABG and Impella 5.5 Insertion 12/19 w Dr. Dorris Fetch.   Assessment/Plan   1.  Cardiogenic Shock:  Patient has had HF symptoms for at least a couple of months.  Suspect pre-existing ischemic cardiomyopathy then he presented this admission with NSTEMI possible in D2 territory leading to CHF exacerbation. RHC 12/3 showed normal filling pressures and low CI at 2.0. Pre CABG echo EF < 20%, RV moderately reduced (PAPi was adequate on RHC)Intra-op course complicated by cardiac stun and failure to wean from bypass. Impella 5.5 placed. Intra-op  TEE EF 20%, RV mildly reduced.  POCUS 12/25 EF 25-30% RV improved. Impella well positioned.  Impella 5.5 @ P5 this morning. Flow 3.5 L No alarms. On Milrinone 0.3, NE5.  Co-ox 61%. CVP 3 but LVEDP 24.  I/Os net negative 1383 with Lasix gtt 4 mg/hr.    - Continue Lasix gtt 4 mg/hr today, LVEDP elevated and still above baseline weight. Would not diurese more aggressively with low CVP.  - Slow Impella wean with plan to remove Monday, decrease to P4 now. On HCO3 purge, no heparin.  - Continue milrinone 0.3, wean NE as MAP allows.  - Continue spironolactone 25 mg daily.  - Add digoxin 0.125.  2. CAD: Patient admitted with NSTEMI (?culprit D2) but with severe extensive diffuse 3 vessel disease.  Disease too extensive to treat percutaneously.  Anatomy felt best served by CABG.  s/p CABG and Impella 5.5 insertion w Dr. Dorris Fetch 12/19. - Continue ASA 81  - Continue atorva 80 3. Post-Operative Bleeding/ ABLA: Coagulopathic on return from OR. Supported with multiple units of blood products including RBCs, cryo, FFP and Factor VII + albumin and protamine, had to return to the OR. Bleeding noted around chest tubes.  Now stable. CTs pulled. Hgb 8.7.  4. 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  Patient has had on and off confusion in hospital.  - labile, improved some today  5. ID:  Suspected Aspiration PNA.  PCT 0.34. Zosyn started 12/25 - Continue Zosyn until Impella pulled.  6. COVID+: Completed Paxlovid 7. Carotid artery stenosis: Pre-CABG dopplers with 80-99% left ICA stenosis. Seen by [facility] - Planning for outpatient follow-up in 1 month (post CABG) with CTA head and neck. 8. DVT prophylaxis: Enoxaparin.  9. FEN: nocturnal TFs.   CRITICAL CARE Performed by: Marca Ancona  Total critical care time: 35 minutes  Critical care time was exclusive of separately billable procedures and treating other patients.  Critical care was necessary to treat or prevent imminent or life-threatening  deterioration.  Critical care was time spent personally by me (independent of midlevel providers or residents) on the following activities: development of treatment plan with patient and/or surrogate as well as nursing, discussions with consultants, evaluation of patient's response to treatment, examination of patient, obtaining history from patient or surrogate, ordering and performing treatments and interventions, ordering and review of laboratory studies, ordering and review of radiographic studies, pulse oximetry and re-evaluation of patient's condition.  Marca Ancona, MD  7:28 AM

## 2023-03-10 NOTE — Progress Notes (Signed)
[address]       [place] [address]             [phone]      9 Days Post-Op  Procedure(s) (LRB): EXPLORATION POST OPERATIVE OPEN HEART (N/A)   Total Length of Stay:  LOS: 24 days    SUBJECTIVE: No complaints  Slept well  No appetite  Vitals:   03/10/23 0645 03/10/23 0700  BP:    Pulse: 90 90  Resp: (!) 25 (!) 24  Temp:    SpO2: 94% 96%    Intake/Output      12/27 0701 12/28 0700 12/28 0701 12/29 0700   P.O.     I.V. (mL/kg) 821.8 (13.2)    Other 226    NG/GT 818.3    IV Piggyback 200.5    Total Intake(mL/kg) 2066.7 (33.2)    Urine (mL/kg/hr) 3450 (2.3)    Emesis/NG output     Other     Stool 0    Blood     Total Output 3450    Net -1383.3         Stool Occurrence 1 x        amiodarone 30 mg/hr (03/10/23 0700)   furosemide (LASIX) 200 mg in dextrose 5 % 100 mL (2 mg/mL) infusion 4 mg/hr (03/10/23 0700)   milrinone 0.3 mcg/kg/min (03/10/23 0700)   norepinephrine (LEVOPHED) Adult infusion 5 mcg/min (03/10/23 0700)   piperacillin-tazobactam (ZOSYN)  IV Stopped (03/10/23 0529)   sodium bicarbonate 25 mEq (Impella PURGE) in dextrose 5 % 1000 mL bag      CBC    Component Value Date/Time   WBC 9.7 03/10/2023 0400   RBC 3.02 (L) 03/10/2023 0400   HGB 8.7 (L) 03/10/2023 0400   HCT 26.7 (L) 03/10/2023 0400   PLT 223 03/10/2023 0400   MCV 88.4 03/10/2023 0400   MCH 28.8 03/10/2023 0400   MCHC 32.6 03/10/2023 0400   RDW 16.4 (H) 03/10/2023 0400   LYMPHSABS 1.3 02/19/2023 0327   MONOABS 0.6 02/19/2023 0327   EOSABS 0.1 02/19/2023 0327   BASOSABS 0.0 02/19/2023 0327   CMP     Component Value Date/Time   NA 129 (L) 03/10/2023 0400   K 4.0 03/10/2023 0400   CL 94 (L) 03/10/2023 0400   CO2 26 03/10/2023 0400   GLUCOSE 157 (H) 03/10/2023 0400   BUN 22 03/10/2023 0400   CREATININE 0.93 03/10/2023 0400   CALCIUM 8.0 (L) 03/10/2023 0400   PROT 4.4 (L) 03/03/2023 0314   ALBUMIN 2.3 (L) 03/10/2023 0400   AST 39 03/03/2023  0314   ALT 11 03/03/2023 0314   ALKPHOS 33 (L) 03/03/2023 0314   BILITOT 0.9 03/03/2023 0314   GFRNONAA >60 03/10/2023 0400   ABG    Component Value Date/Time   PHART 7.481 (H) 03/05/2023 1355   PCO2ART 35.3 03/05/2023 1355   PO2ART 75 (L) 03/05/2023 1355   HCO3 26.2 03/05/2023 1355   TCO2 27 03/05/2023 1355   ACIDBASEDEF 1.0 03/02/2023 1552   O2SAT 60.5 03/10/2023 0400   CBG (last 3)  Recent Labs    03/09/23 1945 03/09/23 2319 03/10/23 0329  GLUCAP 140* 142* 143*  EXAM: Lungs overall clear Card: RR WUX:LKGM no edema Neuro: intact   ASSESSMENT: Post op CABG with impella support Hemodynamics ok on milrinone, norepi, amiodarone and just reduced impella to P4. Coox 60 Plan on removal of impella on Monday Diuresing well on lasix drip. Weight up today  Cont tube feeds   Eugenio Hoes, MD 03/10/2023

## 2023-03-10 NOTE — Progress Notes (Signed)
Discussed with RN: mental status continues to improve. Will see again Monday if needed.  Call if issues over weekend.  Myrla Halsted MD PCCM

## 2023-03-10 NOTE — Progress Notes (Signed)
Peripherally Inserted Central Catheter Placement  The IV Nurse has discussed with the patient and/or persons authorized to consent for the patient, the purpose of this procedure and the potential benefits and risks involved with this procedure.  The benefits include less needle sticks, lab draws from the catheter, and the patient may be discharged home with the catheter. Risks include, but not limited to, infection, bleeding, blood clot (thrombus formation), and puncture of an artery; nerve damage and irregular heartbeat and possibility to perform a PICC exchange if needed/ordered by physician.  Alternatives to this procedure were also discussed.  Bard Power PICC patient education guide, fact sheet on infection prevention and patient information card has been provided to patient /or left at bedside.  Telephone consent obtained from son. Remi Deter.  PICC Placement Documentation  PICC Triple Lumen 03/10/23 Left Brachial 40 cm 0 cm (Active)  Indication for Insertion or Continuance of Line Vasoactive infusions 03/10/23 1042  Exposed Catheter (cm) 0 cm 03/10/23 1042  Site Assessment Clean, Dry, Intact 03/10/23 1042  Lumen #1 Status Flushed;Saline locked;Blood return noted 03/10/23 1042  Lumen #2 Status Flushed;Saline locked;Blood return noted 03/10/23 1042  Lumen #3 Status Flushed;Saline locked;Blood return noted 03/10/23 1042  Dressing Type Transparent;Securing device 03/10/23 1042  Dressing Status Antimicrobial disc in place;Clean, Dry, Intact 03/10/23 1042  Line Care Connections checked and tightened 03/10/23 1042  Line Adjustment (NICU/IV Team Only) No 03/10/23 1042  Dressing Intervention New dressing;Adhesive placed at insertion site (IV team only);Adhesive placed around edges of dressing (IV team/ICU RN only) 03/10/23 1042  Dressing Change Due 03/17/23 03/10/23 1042       Elliot Dally 03/10/2023, 10:43 AM

## 2023-03-11 ENCOUNTER — Inpatient Hospital Stay (HOSPITAL_COMMUNITY): Payer: 59

## 2023-03-11 DIAGNOSIS — M7989 Other specified soft tissue disorders: Secondary | ICD-10-CM | POA: Diagnosis not present

## 2023-03-11 DIAGNOSIS — I5043 Acute on chronic combined systolic (congestive) and diastolic (congestive) heart failure: Secondary | ICD-10-CM

## 2023-03-11 LAB — CBC
HCT: 28.5 % — ABNORMAL LOW (ref 39.0–52.0)
Hemoglobin: 9.5 g/dL — ABNORMAL LOW (ref 13.0–17.0)
MCH: 28.8 pg (ref 26.0–34.0)
MCHC: 33.3 g/dL (ref 30.0–36.0)
MCV: 86.4 fL (ref 80.0–100.0)
Platelets: 289 10*3/uL (ref 150–400)
RBC: 3.3 MIL/uL — ABNORMAL LOW (ref 4.22–5.81)
RDW: 16.2 % — ABNORMAL HIGH (ref 11.5–15.5)
WBC: 13 10*3/uL — ABNORMAL HIGH (ref 4.0–10.5)
nRBC: 0 % (ref 0.0–0.2)

## 2023-03-11 LAB — GLUCOSE, CAPILLARY
Glucose-Capillary: 128 mg/dL — ABNORMAL HIGH (ref 70–99)
Glucose-Capillary: 100 mg/dL — ABNORMAL HIGH (ref 70–99)
Glucose-Capillary: 83 mg/dL (ref 70–99)
Glucose-Capillary: 97 mg/dL (ref 70–99)
Glucose-Capillary: 105 mg/dL — ABNORMAL HIGH (ref 70–99)

## 2023-03-11 LAB — RENAL FUNCTION PANEL
Albumin: 2.4 g/dL — ABNORMAL LOW (ref 3.5–5.0)
Anion gap: 11 (ref 5–15)
BUN: 24 mg/dL — ABNORMAL HIGH (ref 8–23)
CO2: 23 mmol/L (ref 22–32)
Calcium: 8 mg/dL — ABNORMAL LOW (ref 8.9–10.3)
Chloride: 90 mmol/L — ABNORMAL LOW (ref 98–111)
Creatinine, Ser: 0.88 mg/dL (ref 0.61–1.24)
GFR, Estimated: >60 mL/min (ref >60)
Glucose, Bld: 137 mg/dL — ABNORMAL HIGH (ref 70–99)
Phosphorus: 3.4 mg/dL (ref 2.5–4.6)
Potassium: 4.4 mmol/L (ref 3.5–5.1)
Sodium: 124 mmol/L — ABNORMAL LOW (ref 135–145)

## 2023-03-11 LAB — SODIUM: Sodium: 133 mmol/L — ABNORMAL LOW (ref 135–145)

## 2023-03-11 LAB — COOXEMETRY PANEL
Carboxyhemoglobin: 3.1 % — ABNORMAL HIGH (ref 0.5–1.5)
Methemoglobin: 1.7 % — ABNORMAL HIGH (ref 0.0–1.5)
O2 Saturation: 57.6 %
Total hemoglobin: 9.4 g/dL — ABNORMAL LOW (ref 12.0–16.0)

## 2023-03-11 LAB — TYPE AND SCREEN
ABO/RH(D): O POS
Antibody Screen: NEGATIVE

## 2023-03-11 LAB — LACTATE DEHYDROGENASE: LDH: 442 U/L — ABNORMAL HIGH (ref 98–192)

## 2023-03-11 MED ORDER — FUROSEMIDE 10 MG/ML IJ SOLN
40.0000 mg | Freq: Once | INTRAMUSCULAR | Status: AC
  Administered 2023-03-11: 40 mg via INTRAVENOUS
  Filled 2023-03-11: qty 4

## 2023-03-11 MED ORDER — TOLVAPTAN 15 MG PO TABS
15.0000 mg | ORAL_TABLET | Freq: Once | ORAL | Status: AC
Start: 2023-03-11 — End: 2023-03-11
  Administered 2023-03-11: 15 mg via ORAL
  Filled 2023-03-11: qty 1

## 2023-03-11 MED ORDER — AMIODARONE HCL 200 MG PO TABS
200.0000 mg | ORAL_TABLET | Freq: Two times a day (BID) | ORAL | Status: DC
  Administered 2023-03-11 (×2): 200 mg via ORAL
  Filled 2023-03-11 (×2): qty 1

## 2023-03-11 MED ORDER — AMIODARONE HCL 200 MG PO TABS: 200.0000 mg | ORAL_TABLET | Freq: Two times a day (BID) | ORAL | Status: DC

## 2023-03-11 MED ORDER — INSULIN ASPART 100 UNIT/ML IJ SOLN
0.0000 [IU] | Freq: Three times a day (TID) | INTRAMUSCULAR | Status: DC
  Administered 2023-03-15 – 2023-03-20 (×5): 1 [IU] via SUBCUTANEOUS

## 2023-03-11 MED ORDER — QUETIAPINE FUMARATE 25 MG PO TABS
25.0000 mg | ORAL_TABLET | Freq: Two times a day (BID) | ORAL | Status: DC
  Administered 2023-03-11 (×2): 25 mg via ORAL
  Filled 2023-03-11 (×2): qty 1

## 2023-03-11 NOTE — Plan of Care (Signed)
  Problem: Clinical Measurements: Goal: Ability to maintain clinical measurements within normal limits will improve Outcome: Progressing   

## 2023-03-11 NOTE — Anesthesia Preprocedure Evaluation (Signed)
Anesthesia Evaluation  Patient identified by MRN, date of birth, ID band Patient awake    Reviewed: Allergy & Precautions, H&P , NPO status , Patient's Chart, lab work & pertinent test results  Airway Mallampati: II  TM Distance: >3 FB Neck ROM: Full    Dental no notable dental hx. (+) Teeth Intact, Dental Advisory Given   Pulmonary neg pulmonary ROS, former smoker   Pulmonary exam normal breath sounds clear to auscultation       Cardiovascular Exercise Tolerance: Good hypertension, Pt. on medications and Pt. on home beta blockers + CAD, + Past MI, + CABG and +CHF   Rhythm:Regular Rate:Normal     Neuro/Psych negative neurological ROS  negative psych ROS   GI/Hepatic negative GI ROS, Neg liver ROS,,,  Endo/Other  negative endocrine ROS    Renal/GU negative Renal ROS  negative genitourinary   Musculoskeletal   Abdominal   Peds  Hematology negative hematology ROS (+)   Anesthesia Other Findings   Reproductive/Obstetrics negative OB ROS                             Anesthesia Physical Anesthesia Plan  ASA: 4  Anesthesia Plan: General   Post-op Pain Management: Minimal or no pain anticipated   Induction: Intravenous  PONV Risk Score and Plan: 2 and Ondansetron  Airway Management Planned: Oral ETT  Additional Equipment: Arterial line, CVP, PA Cath, TEE and Ultrasound Guidance Line Placement  Intra-op Plan:   Post-operative Plan: Post-operative intubation/ventilation  Informed Consent: I have reviewed the patients History and Physical, chart, labs and discussed the procedure including the risks, benefits and alternatives for the proposed anesthesia with the patient or authorized representative who has indicated his/her understanding and acceptance.     Dental advisory given  Plan Discussed with: CRNA  Anesthesia Plan Comments:        Anesthesia Quick Evaluation

## 2023-03-11 NOTE — Progress Notes (Addendum)
SLP Cancellation Note  Patient Details Name: Walter Vargas MRN: [id number] DOB: 07/17/1945   Cancelled treatment:       Reason Eval/Treat Not Completed: Patient at procedure or test/unavailable. SLP will continue attempts to f/u.    Gwynneth Aliment, M.A., CF-SLP Speech Language Pathology, Acute Rehabilitation Services  Secure Chat preferred [phone]  03/11/2023, 9:31 AM

## 2023-03-11 NOTE — Progress Notes (Signed)
Consent signed for removal of impella on 03/12/23. Placed in pt chart.

## 2023-03-11 NOTE — Progress Notes (Signed)
° °   °  [address]       [place] [address]             [phone]      10 Days Post-Op  Procedure(s) (LRB): EXPLORATION POST OPERATIVE OPEN HEART (N/A)   Total Length of Stay:  LOS: 25 days    SUBJECTIVE: No issues. Still confused AHF decreased Impella to P3 this am Stopped lasix drip overnight  Vitals:   03/11/23 0700 03/11/23 0715  BP:    Pulse: 94 95  Resp: (!) 24 17  Temp:    SpO2: 96% 94%    Intake/Output      12/28 0701 12/29 0700 12/29 0701 12/30 0700   I.V. (mL/kg) 1034.7 (17.4)    Other 220    NG/GT 872.3    IV Piggyback 197    Total Intake(mL/kg) 2324 (39)    Urine (mL/kg/hr) 3150 (2.2)    Stool 0    Total Output 3150    Net -826         Urine Occurrence 1 x    Stool Occurrence 3 x        milrinone 0.3 mcg/kg/min (03/11/23 0700)   norepinephrine (LEVOPHED) Adult infusion 6 mcg/min (03/11/23 0700)   piperacillin-tazobactam (ZOSYN)  IV Stopped (03/11/23 0506)   sodium bicarbonate 25 mEq (Impella PURGE) in dextrose 5 % 1000 mL bag      CBC    Component Value Date/Time   WBC 13.0 (H) 03/11/2023 0311   RBC 3.30 (L) 03/11/2023 0311   HGB 9.5 (L) 03/11/2023 0311   HCT 28.5 (L) 03/11/2023 0311   PLT 289 03/11/2023 0311   MCV 86.4 03/11/2023 0311   MCH 28.8 03/11/2023 0311   MCHC 33.3 03/11/2023 0311   RDW 16.2 (H) 03/11/2023 0311   LYMPHSABS 1.3 02/19/2023 0327   MONOABS 0.6 02/19/2023 0327   EOSABS 0.1 02/19/2023 0327   BASOSABS 0.0 02/19/2023 0327   CMP     Component Value Date/Time   NA 124 (L) 03/11/2023 0311   K 4.4 03/11/2023 0311   CL 90 (L) 03/11/2023 0311   CO2 23 03/11/2023 0311   GLUCOSE 137 (H) 03/11/2023 0311   BUN 24 (H) 03/11/2023 0311   CREATININE 0.88 03/11/2023 0311   CALCIUM 8.0 (L) 03/11/2023 0311   PROT 4.4 (L) 03/03/2023 0314   ALBUMIN 2.4 (L) 03/11/2023 0311   AST 39 03/03/2023 0314   ALT 11 03/03/2023 0314   ALKPHOS 33 (L) 03/03/2023 0314   BILITOT 0.9 03/03/2023 0314   GFRNONAA >60  03/11/2023 0311   ABG    Component Value Date/Time   PHART 7.481 (H) 03/05/2023 1355   PCO2ART 35.3 03/05/2023 1355   PO2ART 75 (L) 03/05/2023 1355   HCO3 26.2 03/05/2023 1355   TCO2 27 03/05/2023 1355   ACIDBASEDEF 1.0 03/02/2023 1552   O2SAT 57.6 03/11/2023 0311   CBG (last 3)  Recent Labs    03/10/23 1947 03/10/23 2331 03/11/23 0306  GLUCAP 129* 120* 128*  EXAM Lungs: clear Card: RR Ext: Warm Neuro: confused   ASSESSMENT: SP 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 Hemodynamics stable on unchanged inotropic support Will stop tube feeds at midnight tonite Hyponatremia: lasix drip stopped. Following lytes Confusion: stable. Adjusting seroquel    Eugenio Hoes, MD 03/11/2023

## 2023-03-11 NOTE — Progress Notes (Signed)
Patient ID: Walter Vargas, male   DOB: 06-Feb-1946, 77 y.o.   MRN: [id number]     Advanced Heart Failure Rounding Note  PCP-Cardiologist: None  Chief Complaint: Cardiogenic Shock  Subjective:    12/19: S/p CABG and Impella 5.5 Insertion. Coagulopathic on return from OR, high chest tube output, required multiple blood products. Subsequent VT associated w/ Impella suction alarms. Back to OR for re-exploratation, bleeding from EPW.   12/23: Bedside echo severe biventricular dysfunction. LVEF 25% RV moderately down. Impella well positioned  12/25: started on Zosyn for fevers/suspected aspiration PNA. PCT 0.34   POD# 10  Impella at P-4, Flows 2.7 L. LDH 442.   Remains on milrinone 0.3, NE 6. MAP 82.  Co-ox 58%, CVP <5 but LVEDP 27 off Impella. Creatinine 0.88.  I/Os net negative 826, IV Lasix stopped overnight. Weight down 6 lbs.  Na lower at 124.   Continues on Zosyn. Fevers resolved.   On amiodarone gtt with history of VT.   Delirious overnight, this morning appropriate and oriented to person/place. Not eating much, does not like dysphagia diet, gets nocturnal tube feeds.   Meds:   aspirin  81 mg Oral Daily   atorvastatin  80 mg Oral Daily   bisacodyl  10 mg Oral Daily   Or   bisacodyl  10 mg Rectal Daily   Chlorhexidine Gluconate Cloth  6 each Topical Daily   digoxin  0.125 mg Oral Daily   docusate  200 mg Oral Daily   enoxaparin (LOVENOX) injection  40 mg Subcutaneous Q24H   feeding supplement  237 mL Oral BID BM   feeding supplement (PROSource TF20)  60 mL Per Tube Daily   feeding supplement (VITAL 1.5 CAL)  660 mL Per Tube Q24H   Gerhardt's butt cream   Topical Daily   guaiFENesin  15 mL Oral Q6H   insulin aspart  0-15 Units Subcutaneous Q4H   mouth rinse  15 mL Mouth Rinse QID   pantoprazole (PROTONIX) IV  40 mg Intravenous QHS   QUEtiapine  25 mg Oral QHS   sodium chloride flush  10-40 mL Intracatheter Q12H   spironolactone  25 mg Oral Daily   thiamine  100 mg Oral  Daily    amiodarone 30 mg/hr (03/11/23 0700)   milrinone 0.3 mcg/kg/min (03/11/23 0700)   norepinephrine (LEVOPHED) Adult infusion 6 mcg/min (03/11/23 0700)   piperacillin-tazobactam (ZOSYN)  IV Stopped (03/11/23 0506)   sodium bicarbonate 25 mEq (Impella PURGE) in dextrose 5 % 1000 mL bag       Objective:   Weight Range: 59.6 kg Body mass index is 19.98 kg/m.   Vital Signs:   Temp:  [98.3 F (36.8 C)-98.8 F (37.1 C)] 98.4 F (36.9 C) (12/28 2333) Pulse Rate:  [83-105] 95 (12/29 0715) Resp:  [15-31] 17 (12/29 0715) SpO2:  [78 %-100 %] 94 % (12/29 0715) Arterial Line BP: (87-155)/(49-80) 126/62 (12/29 0715) Weight:  [59.6 kg] 59.6 kg (12/29 0513) Last BM Date : 03/10/23  Weight change: Filed Weights   03/09/23 0500 03/10/23 0557 03/11/23 0513  Weight: 61.4 kg 62.2 kg 59.6 kg   Intake/Output:   Intake/Output Summary (Last 24 hours) at 03/11/2023 0732 Last data filed at 03/11/2023 0700 Gross per 24 hour  Intake 2324.03 ml  Output 3150 ml  Net -825.97 ml    Physical Exam   General: NAD, thin Neck: No JVD, no thyromegaly or thyroid nodule.  Lungs: Clear to auscultation bilaterally with normal respiratory effort. CV: Nondisplaced PMI.  Heart regular S1/S2, no S3/S4, no murmur.  1+ edema to knee on right.  No carotid bruit.  Normal pedal pulses.  Abdomen: Soft, nontender, no hepatosplenomegaly, no distention.  Skin: Intact without lesions or rashes.  Neurologic: Oriented to person/place.  Psych: Normal affect. Extremities: No clubbing or cyanosis.  HEENT: Normal.   Telemetry   NSR 70-80s Personally reviewed  Patient Profile   Walter Vargas is a 77 y.o. history of HTN, Walter, HFrEF, and severe 3V CAD. S/p CABG and Impella 5.5 Insertion 12/19 w Dr. Dorris Fetch.   Assessment/Plan   1.  Cardiogenic Shock:  Patient has had HF symptoms for at least a couple of months.  Suspect pre-existing ischemic cardiomyopathy then he presented this admission with NSTEMI possible in  D2 territory leading to CHF exacerbation. RHC 12/3 showed normal filling pressures and low CI at 2.0. Pre CABG echo EF < 20%, RV moderately reduced (PAPi was adequate on RHC)Intra-op course complicated by cardiac stun and failure to wean from bypass. Impella 5.5 placed. Intra-op TEE EF 20%, RV mildly reduced.  POCUS 12/25 EF 25-30% RV improved. Impella well positioned.  Impella 5.5 @ P4 this morning. Flow 2.7 L. No alarms. On Milrinone 0.3, NE 6.  Co-ox 58%. CVP < 5 but LVEDP 27.  I/Os mildly negative but weight down. Still significantly above baseline weight.  Now off Lasix gtt.  - Lasix 40 mg IV x 1 today and will give tolvaptan 15 mg x 1 with Na 124.  - Slow Impella wean with plan to remove Monday, decrease to P3 now. On HCO3 purge, no heparin.  - Continue milrinone 0.3, wean NE as MAP allows.  - Continue spironolactone 25 mg daily.  - Continue digoxin 0.125.  2. CAD: Patient admitted with NSTEMI (?culprit D2) but with severe extensive diffuse 3 vessel disease.  Disease too extensive to treat percutaneously.  Anatomy felt best served by CABG.  s/p CABG and Impella 5.5 insertion w Dr. Dorris Fetch 12/19. - Continue ASA 81  - Continue atorva 80 3. Post-Operative Bleeding/ ABLA: Coagulopathic on return from OR. Supported with multiple units of blood products including RBCs, cryo, FFP and Factor VII + albumin and protamine, had to return to the OR. Bleeding noted around chest tubes.  Now stable. CTs pulled. Hgb 9.5.  4. 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  Patient has had on and off confusion in hospital. Confused overnight, now oriented/appropriate.  - Will make Seroquel bid.  5. ID:  Suspected Aspiration PNA.  PCT 0.34. Zosyn started 12/25 - Continue Zosyn until Impella pulled.  6. COVID+: Completed Paxlovid 7. Carotid artery stenosis: Pre-CABG dopplers with 80-99% left ICA stenosis. Seen by [facility] - Planning for outpatient follow-up in 1 month (post CABG) with CTA head and neck. 8. DVT  prophylaxis: Enoxaparin.  Given right lower leg asymmetric edema, will get venous dopplers.  9. FEN: nocturnal TFs.  Not eating much, does not like dysphagia diet.  - Will ask speech/swallow to evaluate again, can we advance diet? 10. H/o VT: Peri-CABG.  - Stop IV amiodarone, change to amiodarone 200 mg bid.  11. Need to mobilize.   CRITICAL CARE Performed by: Marca Ancona  Total critical care time: 40 minutes  Critical care time was exclusive of separately billable procedures and treating other patients.  Critical care was necessary to treat or prevent imminent or life-threatening deterioration.  Critical care was time spent personally by me (independent of midlevel providers or residents) on the following activities: development of treatment plan  with patient and/or surrogate as well as nursing, discussions with consultants, evaluation of patient's response to treatment, examination of patient, obtaining history from patient or surrogate, ordering and performing treatments and interventions, ordering and review of laboratory studies, ordering and review of radiographic studies, pulse oximetry and re-evaluation of patient's condition.  Marca Ancona, MD  7:32 AM

## 2023-03-12 ENCOUNTER — Inpatient Hospital Stay (HOSPITAL_COMMUNITY): Payer: 59

## 2023-03-12 ENCOUNTER — Encounter (HOSPITAL_COMMUNITY)
Admission: AD | Disposition: A | Discharge status: Rehabilitation Facility | Payer: Self-pay | Source: Other Acute Inpatient Hospital | Attending: Thoracic Surgery (Cardiothoracic Vascular Surgery)

## 2023-03-12 DIAGNOSIS — I110 Hypertensive heart disease with heart failure: Secondary | ICD-10-CM

## 2023-03-12 DIAGNOSIS — I509 Heart failure, unspecified: Secondary | ICD-10-CM

## 2023-03-12 DIAGNOSIS — I2510 Atherosclerotic heart disease of native coronary artery without angina pectoris: Secondary | ICD-10-CM | POA: Diagnosis not present

## 2023-03-12 DIAGNOSIS — I255 Ischemic cardiomyopathy: Secondary | ICD-10-CM

## 2023-03-12 HISTORY — PX: REMOVAL OF IMPELLA LEFT VENTRICULAR ASSIST DEVICE: SHX6556

## 2023-03-12 HISTORY — PX: TEE WITHOUT CARDIOVERSION: SHX5443

## 2023-03-12 LAB — POCT I-STAT 7, (LYTES, BLD GAS, ICA,H+H)
Acid-Base Excess: 3 mmol/L — ABNORMAL HIGH (ref 0.0–2.0)
Acid-Base Excess: 1 mmol/L (ref 0.0–2.0)
Acid-Base Excess: 1 mmol/L (ref 0.0–2.0)
Bicarbonate: 26.9 mmol/L (ref 20.0–28.0)
Bicarbonate: 24.7 mmol/L (ref 20.0–28.0)
Bicarbonate: 24.4 mmol/L (ref 20.0–28.0)
Calcium, Ion: 1.14 mmol/L — ABNORMAL LOW (ref 1.15–1.40)
Calcium, Ion: 1.18 mmol/L (ref 1.15–1.40)
Calcium, Ion: 1.16 mmol/L (ref 1.15–1.40)
HCT: 28 % — ABNORMAL LOW (ref 39.0–52.0)
HCT: 25 % — ABNORMAL LOW (ref 39.0–52.0)
HCT: 27 % — ABNORMAL LOW (ref 39.0–52.0)
Hemoglobin: 9.5 g/dL — ABNORMAL LOW (ref 13.0–17.0)
Hemoglobin: 8.5 g/dL — ABNORMAL LOW (ref 13.0–17.0)
Hemoglobin: 9.2 g/dL — ABNORMAL LOW (ref 13.0–17.0)
O2 Saturation: 99 %
O2 Saturation: 99 %
O2 Saturation: 97 %
Patient temperature: 36.2
Patient temperature: 36.3
Patient temperature: 36.6
Potassium: 4.4 mmol/L (ref 3.5–5.1)
Potassium: 4 mmol/L (ref 3.5–5.1)
Potassium: 4.1 mmol/L (ref 3.5–5.1)
Sodium: 133 mmol/L — ABNORMAL LOW (ref 135–145)
Sodium: 133 mmol/L — ABNORMAL LOW (ref 135–145)
Sodium: 135 mmol/L (ref 135–145)
TCO2: 28 mmol/L (ref 22–32)
TCO2: 26 mmol/L (ref 22–32)
TCO2: 25 mmol/L (ref 22–32)
pCO2 arterial: 36.6 mm[Hg] (ref 32–48)
pCO2 arterial: 33.9 mm[Hg] (ref 32–48)
pCO2 arterial: 31.3 mm[Hg] — ABNORMAL LOW (ref 32–48)
pH, Arterial: 7.471 — ABNORMAL HIGH (ref 7.35–7.45)
pH, Arterial: 7.468 — ABNORMAL HIGH (ref 7.35–7.45)
pH, Arterial: 7.498 — ABNORMAL HIGH (ref 7.35–7.45)
pO2, Arterial: 138 mm[Hg] — ABNORMAL HIGH (ref 83–108)
pO2, Arterial: 127 mm[Hg] — ABNORMAL HIGH (ref 83–108)
pO2, Arterial: 78 mm[Hg] — ABNORMAL LOW (ref 83–108)

## 2023-03-12 LAB — GLUCOSE, CAPILLARY
Glucose-Capillary: 140 mg/dL — ABNORMAL HIGH (ref 70–99)
Glucose-Capillary: 116 mg/dL — ABNORMAL HIGH (ref 70–99)
Glucose-Capillary: 105 mg/dL — ABNORMAL HIGH (ref 70–99)
Glucose-Capillary: 55 mg/dL — ABNORMAL LOW (ref 70–99)
Glucose-Capillary: 78 mg/dL (ref 70–99)
Glucose-Capillary: 91 mg/dL (ref 70–99)
Glucose-Capillary: 96 mg/dL (ref 70–99)

## 2023-03-12 LAB — CBC
HCT: 26.8 % — ABNORMAL LOW (ref 39.0–52.0)
Hemoglobin: 8.9 g/dL — ABNORMAL LOW (ref 13.0–17.0)
MCH: 28.6 pg (ref 26.0–34.0)
MCHC: 33.2 g/dL (ref 30.0–36.0)
MCV: 86.2 fL (ref 80.0–100.0)
Platelets: 358 10*3/uL (ref 150–400)
RBC: 3.11 MIL/uL — ABNORMAL LOW (ref 4.22–5.81)
RDW: 16.6 % — ABNORMAL HIGH (ref 11.5–15.5)
WBC: 16.1 10*3/uL — ABNORMAL HIGH (ref 4.0–10.5)
nRBC: 0 % (ref 0.0–0.2)

## 2023-03-12 LAB — LACTATE DEHYDROGENASE: LDH: 300 U/L — ABNORMAL HIGH (ref 98–192)

## 2023-03-12 LAB — RENAL FUNCTION PANEL
Albumin: 2.4 g/dL — ABNORMAL LOW (ref 3.5–5.0)
Anion gap: 15 (ref 5–15)
BUN: 26 mg/dL — ABNORMAL HIGH (ref 8–23)
CO2: 23 mmol/L (ref 22–32)
Calcium: 7.9 mg/dL — ABNORMAL LOW (ref 8.9–10.3)
Chloride: 92 mmol/L — ABNORMAL LOW (ref 98–111)
Creatinine, Ser: 0.94 mg/dL (ref 0.61–1.24)
GFR, Estimated: >60 mL/min (ref >60)
Glucose, Bld: 275 mg/dL — ABNORMAL HIGH (ref 70–99)
Phosphorus: 2.8 mg/dL (ref 2.5–4.6)
Potassium: 3.6 mmol/L (ref 3.5–5.1)
Sodium: 130 mmol/L — ABNORMAL LOW (ref 135–145)

## 2023-03-12 LAB — COOXEMETRY PANEL
Carboxyhemoglobin: 3 % — ABNORMAL HIGH (ref 0.5–1.5)
Methemoglobin: <0.7 % (ref 0.0–1.5)
O2 Saturation: 65.9 %
Total hemoglobin: 8.6 g/dL — ABNORMAL LOW (ref 12.0–16.0)

## 2023-03-12 SURGERY — REMOVAL, CARDIAC ASSIST DEVICE, IMPELLA
Anesthesia: General

## 2023-03-12 MED ORDER — DIGOXIN 125 MCG PO TABS
0.1250 mg | ORAL_TABLET | Freq: Every day | ORAL | Status: DC
  Administered 2023-03-13 – 2023-03-20 (×8): 0.125 mg
  Filled 2023-03-12 (×8): qty 1

## 2023-03-12 MED ORDER — ETOMIDATE 2 MG/ML IV SOLN
INTRAVENOUS | Status: DC | PRN
  Administered 2023-03-12: 10 mg via INTRAVENOUS

## 2023-03-12 MED ORDER — 0.9 % SODIUM CHLORIDE (POUR BTL) OPTIME
TOPICAL | Status: DC | PRN
  Administered 2023-03-12: 1000 mL

## 2023-03-12 MED ORDER — ASPIRIN 81 MG PO CHEW
81.0000 mg | CHEWABLE_TABLET | Freq: Every day | ORAL | Status: DC
  Administered 2023-03-13 – 2023-03-20 (×8): 81 mg
  Filled 2023-03-12 (×8): qty 1

## 2023-03-12 MED ORDER — THIAMINE HCL 100 MG/ML IJ SOLN
500.0000 mg | Freq: Three times a day (TID) | INTRAVENOUS | Status: AC
  Administered 2023-03-12 – 2023-03-13 (×5): 500 mg via INTRAVENOUS
  Filled 2023-03-12 (×8): qty 5

## 2023-03-12 MED ORDER — DOCUSATE SODIUM 50 MG/5ML PO LIQD
200.0000 mg | Freq: Every day | ORAL | Status: DC
  Administered 2023-03-14 – 2023-03-20 (×5): 200 mg
  Filled 2023-03-12 (×5): qty 20

## 2023-03-12 MED ORDER — AMIODARONE HCL 200 MG PO TABS
200.0000 mg | ORAL_TABLET | Freq: Two times a day (BID) | ORAL | Status: DC
  Administered 2023-03-12 – 2023-03-20 (×16): 200 mg
  Filled 2023-03-12 (×16): qty 1

## 2023-03-12 MED ORDER — LACTATED RINGERS IV SOLN: INTRAVENOUS | Status: DC | PRN

## 2023-03-12 MED ORDER — ROCURONIUM BROMIDE 10 MG/ML (PF) SYRINGE
PREFILLED_SYRINGE | INTRAVENOUS | Status: DC | PRN
  Administered 2023-03-12: 70 mg via INTRAVENOUS

## 2023-03-12 MED ORDER — POTASSIUM CHLORIDE 10 MEQ/50ML IV SOLN
10.0000 meq | INTRAVENOUS | Status: AC
  Administered 2023-03-12 (×2): 10 meq via INTRAVENOUS
  Filled 2023-03-12 (×3): qty 50

## 2023-03-12 MED ORDER — QUETIAPINE FUMARATE 25 MG PO TABS
25.0000 mg | ORAL_TABLET | Freq: Two times a day (BID) | ORAL | Status: DC
  Administered 2023-03-12 – 2023-03-19 (×15): 25 mg
  Filled 2023-03-12 (×15): qty 1

## 2023-03-12 MED ORDER — PROPOFOL 10 MG/ML IV BOLUS
INTRAVENOUS | Status: AC
  Filled 2023-03-12: qty 20

## 2023-03-12 MED ORDER — DEXMEDETOMIDINE HCL IN NACL 400 MCG/100ML IV SOLN
0.0000 ug/kg/h | INTRAVENOUS | Status: DC
  Administered 2023-03-12: 0.4 ug/kg/h via INTRAVENOUS
  Filled 2023-03-12 (×2): qty 100

## 2023-03-12 MED ORDER — VANCOMYCIN HCL 1000 MG IV SOLR
INTRAVENOUS | Status: AC
  Filled 2023-03-12: qty 20

## 2023-03-12 MED ORDER — VANCOMYCIN HCL 1000 MG IV SOLR
INTRAVENOUS | Status: DC | PRN
  Administered 2023-03-12: 1000 mg via INTRAVENOUS

## 2023-03-12 MED ORDER — DEXMEDETOMIDINE HCL IN NACL 200 MCG/50ML IV SOLN
INTRAVENOUS | Status: DC | PRN
  Administered 2023-03-12: .7 ug/kg/h via INTRAVENOUS

## 2023-03-12 MED ORDER — ATORVASTATIN CALCIUM 80 MG PO TABS
80.0000 mg | ORAL_TABLET | Freq: Every day | ORAL | Status: DC
  Administered 2023-03-13 – 2023-03-20 (×8): 80 mg
  Filled 2023-03-12 (×8): qty 1

## 2023-03-12 MED ORDER — THIAMINE MONONITRATE 100 MG PO TABS
100.0000 mg | ORAL_TABLET | Freq: Every day | ORAL | Status: DC
  Administered 2023-03-14 – 2023-03-20 (×7): 100 mg via ORAL
  Filled 2023-03-12 (×7): qty 1

## 2023-03-12 MED ORDER — FENTANYL CITRATE (PF) 250 MCG/5ML IJ SOLN
INTRAMUSCULAR | Status: AC
  Filled 2023-03-12: qty 5

## 2023-03-12 MED ORDER — GUAIFENESIN 100 MG/5ML PO LIQD
15.0000 mL | Freq: Four times a day (QID) | ORAL | Status: DC
  Administered 2023-03-12 – 2023-03-20 (×33): 15 mL
  Filled 2023-03-12 (×6): qty 15
  Filled 2023-03-12: qty 20
  Filled 2023-03-12: qty 15
  Filled 2023-03-12: qty 20
  Filled 2023-03-12 (×9): qty 15
  Filled 2023-03-12: qty 20
  Filled 2023-03-12 (×10): qty 15
  Filled 2023-03-12: qty 20
  Filled 2023-03-12 (×4): qty 15

## 2023-03-12 SURGICAL SUPPLY — 45 items
ATTRACTOMAT 16X20 MAGNETIC DRP (DRAPES) ×1 IMPLANT
BAG DECANTER FOR FLEXI CONT (MISCELLANEOUS) ×1 IMPLANT
BLADE CLIPPER SURG (BLADE) ×1 IMPLANT
BLADE SURG 12 STRL SS (BLADE) ×1 IMPLANT
CANISTER SUCT 3000ML PPV (MISCELLANEOUS) ×1 IMPLANT
CLIP TI MEDIUM 24 (CLIP) IMPLANT
CLIP TI WIDE RED SMALL 24 (CLIP) IMPLANT
CONTAINER PROTECT SURGISLUSH (MISCELLANEOUS) ×2 IMPLANT
COVER SURGICAL LIGHT HANDLE (MISCELLANEOUS) ×1 IMPLANT
DRAPE CHEST BREAST 15X10 FENES (DRAPES) ×1 IMPLANT
DRAPE SLUSH/WARMER DISC (DRAPES) ×1 IMPLANT
DRSG AQUACEL AG ADV 3.5X 6 (GAUZE/BANDAGES/DRESSINGS) ×1 IMPLANT
DRSG TEGADERM 4X10 (GAUZE/BANDAGES/DRESSINGS) IMPLANT
ELECT BLADE 4.0 EZ CLEAN MEGAD (MISCELLANEOUS) ×1 IMPLANT
ELECT BLADE 6.5 EXT (BLADE) ×1 IMPLANT
ELECT CAUTERY BLADE 6.4 (BLADE) ×1 IMPLANT
ELECT REM PT RETURN 9FT ADLT (ELECTROSURGICAL) ×2 IMPLANT
ELECTRODE BLDE 4.0 EZ CLN MEGD (MISCELLANEOUS) ×1 IMPLANT
ELECTRODE REM PT RTRN 9FT ADLT (ELECTROSURGICAL) ×2 IMPLANT
GAUZE 4X4 16PLY ~~LOC~~+RFID DBL (SPONGE) ×1 IMPLANT
GAUZE SPONGE 4X4 12PLY STRL (GAUZE/BANDAGES/DRESSINGS) ×2 IMPLANT
GLOVE BIO SURGEON STRL SZ7.5 (GLOVE) ×3 IMPLANT
GOWN STRL REUS W/ TWL LRG LVL3 (GOWN DISPOSABLE) ×4 IMPLANT
HANDLE STAPLE ENDO EGIA 4 STD (STAPLE) IMPLANT
INSERT FOGARTY SM (MISCELLANEOUS) IMPLANT
KIT BASIN OR (CUSTOM PROCEDURE TRAY) ×1 IMPLANT
KIT TURNOVER KIT B (KITS) ×1 IMPLANT
NS IRRIG 1000ML POUR BTL (IV SOLUTION) ×2 IMPLANT
PACK GENERAL/GYN (CUSTOM PROCEDURE TRAY) ×1 IMPLANT
PACK OPEN HEART (CUSTOM PROCEDURE TRAY) ×1 IMPLANT
PAD ARMBOARD 7.5X6 YLW CONV (MISCELLANEOUS) ×2 IMPLANT
PAD ELECT DEFIB RADIOL ZOLL (MISCELLANEOUS) ×1 IMPLANT
POSITIONER HEAD DONUT 9IN (MISCELLANEOUS) ×1 IMPLANT
RELOAD TRI 2.0 30 VAS MED SUL (STAPLE) IMPLANT
SET TUBE SMOKE EVAC HIGH FLOW (TUBING) ×1 IMPLANT
SPONGE T-LAP 18X18 ~~LOC~~+RFID (SPONGE) ×4 IMPLANT
SPONGE T-LAP 4X18 ~~LOC~~+RFID (SPONGE) ×1 IMPLANT
STAPLER SKIN PROX WIDE 3.9 (STAPLE) IMPLANT
SUT PROLENE 4-0 RB1 .5 CRCL 36 (SUTURE) ×1 IMPLANT
SUT VIC AB 1 CTX36XBRD ANBCTR (SUTURE) ×2 IMPLANT
SUT VIC AB 2-0 CT1 TAPERPNT 27 (SUTURE) ×2 IMPLANT
SUT VIC AB 3-0 X1 27 (SUTURE) ×2 IMPLANT
TOWEL GREEN STERILE (TOWEL DISPOSABLE) ×1 IMPLANT
TOWEL GREEN STERILE FF (TOWEL DISPOSABLE) ×1 IMPLANT
WATER STERILE IRR 1000ML POUR (IV SOLUTION) ×1 IMPLANT

## 2023-03-12 NOTE — Anesthesia Procedure Notes (Signed)
Central Venous Catheter Insertion Performed by: Gaynelle Adu, MD, anesthesiologist Start/End12/30/2024 7:40 AM, 03/12/2023 7:55 AM Patient location: Pre-op. Preanesthetic checklist: patient identified, IV checked, site marked, risks and benefits discussed, surgical consent, monitors and equipment checked, pre-op evaluation, timeout performed and anesthesia consent Position: Trendelenburg Lidocaine 1% used for infiltration and patient sedated Hand hygiene performed , maximum sterile barriers used  and Seldinger technique used Catheter size: 8.5 Fr Total catheter length 10. Central line was placed.Sheath introducer Procedure performed using ultrasound guided technique. Ultrasound Notes:anatomy identified, needle tip was noted to be adjacent to the nerve/plexus identified, no ultrasound evidence of intravascular and/or intraneural injection and image(s) printed for medical record Attempts: 1 Following insertion, line sutured, dressing applied and Biopatch. Post procedure assessment: blood return through all ports, free fluid flow and no air  Patient tolerated the procedure well with no immediate complications.

## 2023-03-12 NOTE — Interval H&P Note (Signed)
History and Physical Interval Note:  Coox 67 on p3 Milrinone 0.03, norepi 2 03/12/2023 7:14 AM  Walter Vargas  has presented today for surgery, with the diagnosis of CHF.  The various methods of treatment have been discussed with the patient and family. After consideration of risks, benefits and other options for treatment, the patient has consented to  Procedure(s): REMOVAL OF IMPELLA LEFT VENTRICULAR ASSIST DEVICE (N/A) TRANSESOPHAGEAL ECHOCARDIOGRAM (TEE) (N/A) as a surgical intervention.  The patient's history has been reviewed, patient examined, no change in status, stable for surgery.  I have reviewed the patient's chart and labs.  Questions were answered to the patient's satisfaction.     [name]

## 2023-03-12 NOTE — Procedures (Signed)
Cortrak  Person Inserting Tube:  Greig Castilla D, RD Tube Type:  Cortrak - 43 inches Tube Size:  10 Tube Location:  Left nare Secured by: Bridle Technique Used to Measure Tube Placement:  Marking at nare/corner of mouth Cortrak Secured At:  67 cm Procedure Comments:  Cortrak Tube Team Note:  Consult received to place a Cortrak feeding tube.   X-ray is required, abdominal x-ray has been ordered by the Cortrak team. Please confirm tube placement before using the Cortrak tube.   If the tube becomes dislodged please keep the tube and contact the Cortrak team at [email or web address] for replacement.  If after hours and replacement cannot be delayed, place a NG tube and confirm placement with an abdominal x-ray.    Greig Castilla, RD, LDN Registered Dietitian II Please reach out via secure chat Weekend on-call pager # available in [facility]

## 2023-03-12 NOTE — Anesthesia Postprocedure Evaluation (Signed)
Anesthesia Post Note  Patient: Walter Vargas  Procedure(s) Performed: EXPLORATION POST OPERATIVE OPEN HEART     Patient location during evaluation: SICU Anesthesia Type: General Level of consciousness: sedated and patient remains intubated per anesthesia plan Pain management: pain level controlled Vital Signs Assessment: post-procedure vital signs reviewed and stable Respiratory status: patient on ventilator - see flowsheet for VS and patient remains intubated per anesthesia plan Cardiovascular status: stable Anesthetic complications: no   No notable events documented.  Last Vitals:  Vitals:   03/12/23 1700 03/12/23 1715  BP: (!) 106/56   Pulse: 85 83  Resp: 20 (!) 21  Temp: 37.5 C 37.5 C  SpO2: 97% 98%    Last Pain:  Vitals:   03/12/23 1600  TempSrc: Core  PainSc: Asleep                 Lewie Loron

## 2023-03-12 NOTE — Progress Notes (Signed)
PT Cancellation Note  Patient Details Name: Walter Vargas MRN: [id number] DOB: 1945-10-15   Cancelled Treatment:    Reason Eval/Treat Not Completed: Patient at procedure or test/unavailable. Pt in OR for L impella removal. Acute PT to return as appropriate to re-asses mobility.  Lewis Shock, PT, DPT Acute Rehabilitation Services Secure chat preferred Office #: [phone]    Iona Hansen 03/12/2023, 9:20 AM

## 2023-03-12 NOTE — Progress Notes (Signed)
EVENING ROUNDS NOTE :     [address]       [place] [id number]             [phone]                 * Day of Surgery * Procedure(s) (LRB): REMOVAL OF IMPELLA LEFT VENTRICULAR ASSIST DEVICE (N/A) TRANSESOPHAGEAL ECHOCARDIOGRAM (TEE) (N/A)   Total Length of Stay:  LOS: 26 days  Events:   No events Good hemodynamics    BP (!) 106/56   Pulse 83   Temp 99.5 F (37.5 C)   Resp (!) 21   Ht 5\' 8"  (1.727 m)   Wt 54 kg   SpO2 98%   BMI 18.10 kg/m   PAP: (17-35)/(7-22) 32/16 CVP:  [0 mmHg-3 mmHg] 2 mmHg CO:  [4.1 L/min-5.1 L/min] 5.1 L/min CI:  [2.46 L/min/m2-3.1 L/min/m2] 3.1 L/min/m2  Vent Mode: PSV;CPAP FiO2 (%):  [40 %-50 %] 40 % Set Rate:  [4 bmp-16 bmp] 4 bmp Vt Set:  [540 mL] 540 mL PEEP:  [5 cmH20] 5 cmH20 Pressure Support:  [10 cmH20] 10 cmH20   dexmedetomidine (PRECEDEX) IV infusion Stopped (03/12/23 1150)   milrinone 0.3 mcg/kg/min (03/12/23 1700)   norepinephrine (LEVOPHED) Adult infusion 2 mcg/min (03/12/23 1700)   sodium bicarbonate 25 mEq (Impella PURGE) in dextrose 5 % 1000 mL bag     thiamine (VITAMIN B1) injection Stopped (03/12/23 1516)    I/O last 3 completed shifts: In: 3748.6 [P.O.:720; I.V.:1236.8; Other:326; NG/GT:1179.6; IV Piggyback:286.2] Out: 4100 [Urine:4100]      Latest Ref Rng & Units 03/12/2023    1:10 PM 03/12/2023   11:28 AM 03/12/2023   10:01 AM  CBC  Hemoglobin 13.0 - 17.0 g/dL 9.2  8.5  9.5   Hematocrit 39.0 - 52.0 % 27.0  25.0  28.0        Latest Ref Rng & Units 03/12/2023    1:10 PM 03/12/2023   11:28 AM 03/12/2023   10:01 AM  BMP  Sodium 135 - 145 mmol/L 135  133  133   Potassium 3.5 - 5.1 mmol/L 4.1  4.0  4.4     ABG    Component Value Date/Time   PHART 7.498 (H) 03/12/2023 1310   PCO2ART 31.3 (L) 03/12/2023 1310   PO2ART 78 (L) 03/12/2023 1310   HCO3 24.4 03/12/2023 1310   TCO2 25 03/12/2023 1310   ACIDBASEDEF 1.0 03/02/2023 1552   O2SAT 97 03/12/2023 1310       Walter Greathouse, MD 03/12/2023 6:09 PM

## 2023-03-12 NOTE — Progress Notes (Signed)
03/12/2023 Confusion still present but mild. For impella removal today. Apparently confusion pre-dated admission question developing dementia. Will give some empiric thiamine just in case.  Available as needed.  Myrla Halsted MD PCCM

## 2023-03-12 NOTE — Progress Notes (Signed)
SLP Cancellation Note  Patient Details Name: Walter Vargas MRN: [id number] DOB: [birth year]   Cancelled treatment:  Pt remains intubated after removal of Impella.  SLP will follow for readiness.  Millard Bautch L. Samson Frederic, MA CCC/SLP Clinical Specialist - Acute Care SLP Acute Rehabilitation Services Office number [phone]          Blenda Mounts Laurice 03/12/2023, 10:45 AM

## 2023-03-12 NOTE — Anesthesia Procedure Notes (Signed)
Central Venous Catheter Insertion Performed by: Gaynelle Adu, MD, anesthesiologist Start/End12/30/2024 7:40 AM, 03/12/2023 7:55 AM Patient location: Pre-op. Preanesthetic checklist: patient identified, IV checked, site marked, risks and benefits discussed, surgical consent, monitors and equipment checked, pre-op evaluation, timeout performed and anesthesia consent Hand hygiene performed  and maximum sterile barriers used  PA cath was placed.Swan type:thermodilution PA Cath depth:60 Procedure performed without using ultrasound guided technique. Attempts: 1 Patient tolerated the procedure well with no immediate complications.

## 2023-03-12 NOTE — Op Note (Unsigned)
Walter Vargas, Walter Vargas MEDICAL RECORD NO: [id number] ACCOUNT NO: [id number] DATE OF BIRTH: 1945-07-29 FACILITY: [facility] LOCATION: [facility] PHYSICIAN: Salvatore Decent. Dorris Fetch, MD  Operative Report   DATE OF PROCEDURE: 03/12/2023  PREOPERATIVE DIAGNOSIS:  Congestive heart failure with ischemic cardiomyopathy.  POSTOPERATIVE DIAGNOSIS:  Congestive heart failure with ischemic cardiomyopathy.  PROCEDURE:  Removal of Impella 5.5 left ventricular assist device.  SURGEON:  Salvatore Decent. Dorris Fetch, MD  ASSISTANT:  None.  ANESTHESIA:  General.  FINDINGS:  No hemodynamic changes with decrease of Impella from P3 to P2.  No hemodynamic change with removal of Impella device.  Transesophageal echocardiography shows severe left ventricular systolic dysfunction.  No significant valvular pathology.  CLINICAL NOTE:  The patient is a 77 year old gentleman with severe three-vessel coronary artery disease and ischemic cardiomyopathy who had undergone coronary bypass grafting and placement of an Impella left ventricular assist device.  He has now weaned  to the point where he is felt to no longer need the assistive device.  He and his family were advised to undergo removal of the Impella.  The indications, risks, benefits, and alternatives were discussed with the patient and family, permission was  granted.  OPERATIVE NOTE:  The patient was brought to the operating room on 03/12/2023.  He had induction of general anesthesia and was intubated.  A Swan-Ganz catheter was placed.  Initial cardiac index was greater than 2 liters per minute per meter square.   Pulmonary artery pressures vary depending on afterload.  With decrease from P3 to P2 on the Impella device, there was no hemodynamic change.  The chest, neck, and abdomen were prepped and draped in the usual sterile fashion.  A timeout was performed.   The staples in the supraclavicular wound were removed exposing the graft.  The sutures were cut and removed.  The  Impella device was pulled back into the ascending aorta.  There was no hemodynamic change.  The device then was turned off and removed  intact without difficulty.  The patient was observed with no hemodynamic changes.  Echocardiogram showed no valvular dysfunction.  There was severe left ventricular dysfunction, unchanged.  The Hemashield graft was oversewn with a 4-0 Prolene suture.   The wound was copiously irrigated with saline.  The subcutaneous tissue was closed with a running 3-0 Vicryl suture and staples were applied.  The patient then was transported from the operating room back to the surgical intensive care unit, intubated  and in critical, but stable condition.  All sponge, needle, and instrument counts were correct at the end of the procedure.   PUS D: 03/12/2023 9:42:28 am T: 03/12/2023 11:13:00 am  JOB: [id number]/ [id number]

## 2023-03-12 NOTE — Progress Notes (Signed)
Inpatient Rehab Admissions Coordinator:   Left message for pt's son to discuss caregiver support and next steps towards CIR.  Impella out this AM and weaning gtts.  Potentially ready to begin insurance auth request in the next 1-2 days if family planning on 24/7 support at discharge.   Estill Dooms, PT, DPT Admissions Coordinator [phone] 03/12/23  1:55 PM

## 2023-03-12 NOTE — Progress Notes (Signed)
Patient ID: Walter Vargas, male   DOB: Aug 13, 1945, 77 y.o.   MRN: [id number]     Advanced Heart Failure Rounding Note  PCP-Cardiologist: None  Chief Complaint: Cardiogenic Shock  Subjective:    12/19: S/p CABG and Impella 5.5 Insertion. Coagulopathic on return from OR, high chest tube output, required multiple blood products. Subsequent VT associated w/ Impella suction alarms. Back to OR for re-exploratation, bleeding from EPW.   12/23: Bedside echo severe biventricular dysfunction. LVEF 25% RV moderately down. Impella well positioned  12/25: started on Zosyn for fevers/suspected aspiration PNA. PCT 0.34   POD# 11  Impella at P-3, Flows 2.2 L. LDH 300.   Remains on milrinone 0.3, NE 2. MAP stable.  Co-ox 66%, CVP <5. Creatinine 0.88.  I/Os net negative 780 with 1 dose of IV Lasix yesterday + tolvaptan. Weight down.  Na 124 => 130.   Continues on Zosyn. Fevers resolved.   On amiodarone with history of VT.   Oriented to person/place and alert this morning.   Meds:   amiodarone  200 mg Oral BID   aspirin  81 mg Oral Daily   atorvastatin  80 mg Oral Daily   bisacodyl  10 mg Oral Daily   Or   bisacodyl  10 mg Rectal Daily   Chlorhexidine Gluconate Cloth  6 each Topical Daily   digoxin  0.125 mg Oral Daily   docusate  200 mg Oral Daily   enoxaparin (LOVENOX) injection  40 mg Subcutaneous Q24H   feeding supplement  237 mL Oral BID BM   feeding supplement (PROSource TF20)  60 mL Per Tube Daily   feeding supplement (VITAL 1.5 CAL)  660 mL Per Tube Q24H   Gerhardt's butt cream   Topical Daily   guaiFENesin  15 mL Oral Q6H   insulin aspart  0-9 Units Subcutaneous TID WC   mouth rinse  15 mL Mouth Rinse QID   pantoprazole (PROTONIX) IV  40 mg Intravenous QHS   QUEtiapine  25 mg Oral BID   sodium chloride flush  10-40 mL Intracatheter Q12H   spironolactone  25 mg Oral Daily   [START ON 03/14/2023] thiamine  100 mg Oral Daily    milrinone 0.3 mcg/kg/min (03/12/23 0700)    norepinephrine (LEVOPHED) Adult infusion 2 mcg/min (03/12/23 0700)   potassium chloride 50 mL/hr at 03/12/23 0700   sodium bicarbonate 25 mEq (Impella PURGE) in dextrose 5 % 1000 mL bag     thiamine (VITAMIN B1) injection       Objective:   Weight Range: 54 kg Body mass index is 18.1 kg/m.   Vital Signs:   Temp:  [97.7 F (36.5 C)-98.4 F (36.9 C)] 98 F (36.7 C) (12/30 0430) Pulse Rate:  [88-103] 88 (12/30 0700) Resp:  [16-33] 22 (12/30 0700) BP: (112-123)/(65-70) 112/70 (12/30 0700) SpO2:  [60 %-100 %] 93 % (12/30 0700) Arterial Line BP: (81-169)/(43-85) 118/49 (12/30 0700) Weight:  [54 kg] 54 kg (12/30 0430) Last BM Date : 03/11/23  Weight change: Filed Weights   03/10/23 0557 03/11/23 0513 03/12/23 0430  Weight: 62.2 kg 59.6 kg 54 kg   Intake/Output:   Intake/Output Summary (Last 24 hours) at 03/12/2023 4098 Last data filed at 03/12/2023 0700 Gross per 24 hour  Intake 2419.46 ml  Output 3200 ml  Net -780.54 ml    Physical Exam   General: NAD Neck: No JVD, no thyromegaly or thyroid nodule.  Lungs: Clear to auscultation bilaterally with normal respiratory effort. CV: Nondisplaced PMI.  Heart regular S1/S2, no S3/S4, no murmur.  1+ edema right ankle.  Abdomen: Soft, nontender, no hepatosplenomegaly, no distention.  Skin: Intact without lesions or rashes.  Neurologic: Alert and oriented x 3.  Psych: Normal affect. Extremities: No clubbing or cyanosis.  HEENT: Normal.   Telemetry   NSR 70-80s Personally reviewed  Patient Profile   Walter Vargas is a 77 y.o. history of HTN, Walter, HFrEF, and severe 3V CAD. S/p CABG and Impella 5.5 Insertion 12/19 w Dr. Dorris Fetch.   Assessment/Plan   1.  Cardiogenic Shock:  Patient has had HF symptoms for at least a couple of months.  Suspect pre-existing ischemic cardiomyopathy then he presented this admission with NSTEMI possible in D2 territory leading to CHF exacerbation. RHC 12/3 showed normal filling pressures and low CI  at 2.0. Pre CABG echo EF < 20%, RV moderately reduced (PAPi was adequate on RHC)Intra-op course complicated by cardiac stun and failure to wean from bypass. Impella 5.5 placed. Intra-op TEE EF 20%, RV mildly reduced.  POCUS 12/25 EF 25-30% RV improved. Impella well positioned.  Impella 5.5 @ P3 this morning. Flow 2.2 L. No alarms. On Milrinone 0.3, NE 2.  Co-ox 66%. CVP < 5.  I/Os negative and weight seems to be near baseline now.  - No Lasix for now.  - Plan to remove Impella 5.5 today, discussed with Dr. Dorris Fetch. - Continue milrinone 0.3, wean NE as MAP allows after Impella removal (will leave NE at 2 for now).  - Continue digoxin 0.125.  2. CAD: Patient admitted with NSTEMI (?culprit D2) but with severe extensive diffuse 3 vessel disease.  Disease too extensive to treat percutaneously.  Anatomy felt best served by CABG.  s/p CABG and Impella 5.5 insertion w Dr. Dorris Fetch 12/19. - Continue ASA 81  - Continue atorva 80 3. Post-Operative Bleeding/ ABLA: Coagulopathic on return from OR. Supported with multiple units of blood products including RBCs, cryo, FFP and Factor VII + albumin and protamine, had to return to the OR. Bleeding noted around chest tubes.  Now stable. CTs pulled.  - Needs CBC.  4. 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  Patient has had on and off confusion in hospital. Appropriate this morning.  - Continue Seroquel bid.  5. ID:  Suspected Aspiration PNA.  PCT 0.34. Zosyn started 12/25 - Continue Zosyn until Impella pulled.  6. COVID+: Completed Paxlovid 7. Carotid artery stenosis: Pre-CABG dopplers with 80-99% left ICA stenosis. Seen by [facility] - Planning for outpatient follow-up in 1 month (post CABG) with CTA head and neck. 8. DVT prophylaxis: Enoxaparin.  Right lower leg asymmetric edema => venous dopplers negative for DVT.  9. FEN: nocturnal TFs.  Not eating much, does not like dysphagia diet.  - Will ask speech/swallow to evaluate again, can we advance diet? 10.  H/o VT: Peri-CABG.  - Continue amiodarone 200 mg bid.  11. Hyponatremia: Had tolvaptan 12/29, Na up to 130 today.  12. Need to mobilize.   CRITICAL CARE Performed by: Marca Ancona  Total critical care time: 35 minutes  Critical care time was exclusive of separately billable procedures and treating other patients.  Critical care was necessary to treat or prevent imminent or life-threatening deterioration.  Critical care was time spent personally by me (independent of midlevel providers or residents) on the following activities: development of treatment plan with patient and/or surrogate as well as nursing, discussions with consultants, evaluation of patient's response to treatment, examination of patient, obtaining history from patient or surrogate, ordering and  performing treatments and interventions, ordering and review of laboratory studies, ordering and review of radiographic studies, pulse oximetry and re-evaluation of patient's condition.  Marca Ancona, MD  7:22 AM

## 2023-03-12 NOTE — Anesthesia Procedure Notes (Signed)
Procedure Name: Intubation Date/Time: 03/12/2023 8:04 AM  Performed by: Yolonda Kida, CRNAPre-anesthesia Checklist: Patient identified, Emergency Drugs available, Suction available and Patient being monitored Patient Re-evaluated:Patient Re-evaluated prior to induction Oxygen Delivery Method: Circle System Utilized Preoxygenation: Pre-oxygenation with 100% oxygen Induction Type: IV induction Ventilation: Mask ventilation without difficulty Laryngoscope Size: Mac and 4 Grade View: Grade I Tube type: Oral Tube size: 8.0 mm Number of attempts: 1 Airway Equipment and Method: Stylet Placement Confirmation: ETT inserted through vocal cords under direct vision, positive ETCO2 and breath sounds checked- equal and bilateral Secured at: 23 cm Tube secured with: Tape Dental Injury: Teeth and Oropharynx as per pre-operative assessment

## 2023-03-12 NOTE — Progress Notes (Signed)
Inpatient Rehab Admissions Coordinator:   Spoke to pt's son, [name], on the phone to discuss progress towards rehab.  Answered questions, family will provide 24/7 support at pt's home at discharge. I will touch base with therapy to see if we can get updated notes tomorrow to proceed with insurance auth request.   Estill Dooms, PT, DPT Admissions Coordinator [phone] 03/12/23  5:02 PM

## 2023-03-12 NOTE — Brief Op Note (Signed)
03/12/2023  8:47 AM  PATIENT:  Walter Vargas  77 y.o. male  PRE-OPERATIVE DIAGNOSIS:  CONGESTIVE HEART FAILURE  POST-OPERATIVE DIAGNOSIS:  CONGESTIVE HEART FAILURE  PROCEDURE:  Procedure(s): REMOVAL OF IMPELLA LEFT VENTRICULAR ASSIST DEVICE (N/A) TRANSESOPHAGEAL ECHOCARDIOGRAM (TEE) (N/A)  SURGEON:  Surgeons and Role:    * [name], MD - Primary  PHYSICIAN ASSISTANT:   ASSISTANTS: none   ANESTHESIA:   general  EBL:  minimal  BLOOD ADMINISTERED:none  DRAINS: none   LOCAL MEDICATIONS USED:  NONE  SPECIMEN:  Source of Specimen:  Impella device  DISPOSITION OF SPECIMEN:  N/A  COUNTS:  YES  TOURNIQUET:  * No tourniquets in log *  DICTATION: .Other Dictation: Dictation Number -  PLAN OF CARE:  already inpatient  PATIENT DISPOSITION:  ICU - intubated and hemodynamically stable.   Delay start of Pharmacological VTE agent (>24hrs) due to surgical blood loss or risk of bleeding: no

## 2023-03-12 NOTE — Progress Notes (Signed)
NIF weaning parameter not met despite multiple attempts with pt and bedside RN. CCM notified. Verbal order received to proceed with extubation after cortrak has been placed. RT will continue to monitor and be available as needed.

## 2023-03-12 NOTE — Anesthesia Postprocedure Evaluation (Signed)
Anesthesia Post Note  Patient: Walter Vargas  Procedure(s) Performed: REMOVAL OF IMPELLA LEFT VENTRICULAR ASSIST DEVICE TRANSESOPHAGEAL ECHOCARDIOGRAM (TEE)     Patient location during evaluation: SICU Anesthesia Type: General Level of consciousness: sedated Pain management: pain level controlled Vital Signs Assessment: post-procedure vital signs reviewed and stable Respiratory status: patient remains intubated per anesthesia plan Cardiovascular status: stable Postop Assessment: no apparent nausea or vomiting Anesthetic complications: no  No notable events documented.  Last Vitals:  Vitals:   03/12/23 0730 03/12/23 0901  BP:  127/60  Pulse: 86 83  Resp: (!) 22 16  Temp:    SpO2: 94% 99%    Last Pain:  Vitals:   03/12/23 0430  TempSrc: Oral  PainSc: 0-No pain                 Leonie Amacher,W. EDMOND

## 2023-03-12 NOTE — Procedures (Signed)
Extubation Procedure Note  Patient Details:   Name: Walter Vargas DOB: March 13, 1946 MRN: [id number]   Airway Documentation:    Vent end date: 03/12/23 Vent end time: 1150   Evaluation  O2 sats: stable throughout Complications: No apparent complications Patient did tolerate procedure well. Bilateral Breath Sounds: Clear, Diminished   Yes  Pt extubated per rapid wean protocol/physician order. Pt with positive cuff leak and suctioned via ETT/orally prior. Upon extubation pt placed on 4L nasal cannula, able to speak his name, give a good cough and no stridor was heard. Pt Vital capacity and pt unable to perform NIF. Pt tolerated procedure well, RT will continue to monitor and be available.   Derinda Late 03/12/2023, 11:58 AM

## 2023-03-12 NOTE — Transfer of Care (Signed)
Immediate Anesthesia Transfer of Care Note  Patient: Walter Vargas  Procedure(s) Performed: REMOVAL OF IMPELLA LEFT VENTRICULAR ASSIST DEVICE TRANSESOPHAGEAL ECHOCARDIOGRAM (TEE)  Patient Location: ICU  Anesthesia Type:General  Level of Consciousness: sedated and Patient remains intubated per anesthesia plan  Airway & Oxygen Therapy: Patient remains intubated per anesthesia plan and Patient placed on Ventilator (see vital sign flow sheet for setting)  Post-op Assessment: Report given to RN and Post -op Vital signs reviewed and stable  Post vital signs: Reviewed and stable  Last Vitals:  Vitals Value Taken Time  BP 127/60 03/12/23 0901  Temp    Pulse 83 03/12/23 0901  Resp 16 03/12/23 0901  SpO2 99 % 03/12/23 0901    Last Pain:  Vitals:   03/12/23 0430  TempSrc: Oral  PainSc: 0-No pain      Patients Stated Pain Goal: 0 (03/08/23 2000)  Complications: No notable events documented.

## 2023-03-13 ENCOUNTER — Encounter (HOSPITAL_COMMUNITY): Payer: Self-pay | Admitting: Thoracic Surgery (Cardiothoracic Vascular Surgery)

## 2023-03-13 ENCOUNTER — Inpatient Hospital Stay (HOSPITAL_COMMUNITY): Payer: 59

## 2023-03-13 LAB — CBC
HCT: 26.2 % — ABNORMAL LOW (ref 39.0–52.0)
Hemoglobin: 8.4 g/dL — ABNORMAL LOW (ref 13.0–17.0)
MCH: 29 pg (ref 26.0–34.0)
MCHC: 32.1 g/dL (ref 30.0–36.0)
MCV: 90.3 fL (ref 80.0–100.0)
Platelets: 387 10*3/uL (ref 150–400)
RBC: 2.9 MIL/uL — ABNORMAL LOW (ref 4.22–5.81)
RDW: 16.5 % — ABNORMAL HIGH (ref 11.5–15.5)
WBC: 13.2 10*3/uL — ABNORMAL HIGH (ref 4.0–10.5)
nRBC: 0 % (ref 0.0–0.2)

## 2023-03-13 LAB — RENAL FUNCTION PANEL
Albumin: 2.3 g/dL — ABNORMAL LOW (ref 3.5–5.0)
Anion gap: 6 (ref 5–15)
BUN: 26 mg/dL — ABNORMAL HIGH (ref 8–23)
CO2: 25 mmol/L (ref 22–32)
Calcium: 8.2 mg/dL — ABNORMAL LOW (ref 8.9–10.3)
Chloride: 105 mmol/L (ref 98–111)
Creatinine, Ser: 0.75 mg/dL (ref 0.61–1.24)
GFR, Estimated: >60 mL/min (ref >60)
Glucose, Bld: 148 mg/dL — ABNORMAL HIGH (ref 70–99)
Phosphorus: 2.5 mg/dL (ref 2.5–4.6)
Potassium: 3.9 mmol/L (ref 3.5–5.1)
Sodium: 136 mmol/L (ref 135–145)

## 2023-03-13 LAB — COOXEMETRY PANEL
Carboxyhemoglobin: 2.4 % — ABNORMAL HIGH (ref 0.5–1.5)
Methemoglobin: 0.8 % (ref 0.0–1.5)
O2 Saturation: 71.4 %
Total hemoglobin: 9 g/dL — ABNORMAL LOW (ref 12.0–16.0)

## 2023-03-13 LAB — GLUCOSE, CAPILLARY
Glucose-Capillary: 112 mg/dL — ABNORMAL HIGH (ref 70–99)
Glucose-Capillary: 90 mg/dL (ref 70–99)
Glucose-Capillary: 101 mg/dL — ABNORMAL HIGH (ref 70–99)
Glucose-Capillary: 123 mg/dL — ABNORMAL HIGH (ref 70–99)

## 2023-03-13 LAB — LACTATE DEHYDROGENASE: LDH: 336 U/L — ABNORMAL HIGH (ref 98–192)

## 2023-03-13 MED ORDER — FUROSEMIDE 10 MG/ML IJ SOLN
40.0000 mg | Freq: Once | INTRAMUSCULAR | Status: AC
  Administered 2023-03-13: 40 mg via INTRAVENOUS
  Filled 2023-03-13: qty 4

## 2023-03-13 MED ORDER — POTASSIUM CHLORIDE 20 MEQ PO PACK
40.0000 meq | PACK | Freq: Once | ORAL | Status: AC
  Administered 2023-03-13: 40 meq
  Filled 2023-03-13: qty 2

## 2023-03-13 MED ORDER — ENSURE ENLIVE PO LIQD
237.0000 mL | Freq: Three times a day (TID) | ORAL | Status: DC
  Administered 2023-03-13 – 2023-03-20 (×20): 237 mL via ORAL

## 2023-03-13 MED ORDER — SPIRONOLACTONE 12.5 MG HALF TABLET
12.5000 mg | ORAL_TABLET | Freq: Every day | ORAL | Status: DC
  Administered 2023-03-13 – 2023-03-14 (×2): 12.5 mg via ORAL
  Filled 2023-03-13 (×2): qty 1

## 2023-03-13 MED ORDER — FUROSEMIDE 20 MG PO TABS: 20.0000 mg | ORAL_TABLET | Freq: Every day | ORAL | Status: DC

## 2023-03-13 NOTE — Progress Notes (Signed)
 Inpatient Rehab Admissions Coordinator:   Met with pt at bedside.  Still with EPW, on NE/milrinone  drip.  On oral amio.  Discussed with nursing, not sure when EPW out but doing well weaning drips.  Will continue to follow.  Not ready to open insurance yet.   Reche Lowers, PT, DPT Admissions Coordinator [phone] 03/13/23  12:43 PM

## 2023-03-13 NOTE — Progress Notes (Signed)
 Speech Language Pathology Treatment: Dysphagia  Patient Details Name: Walter Vargas MRN: [id number] DOB: 07/27/1945 Today's Date: 03/13/2023 Time: 8341-8288 SLP Time Calculation (min) (ACUTE ONLY): 13 min  Assessment / Plan / Recommendation Clinical Impression  Per RN, pt significantly agitated earlier this date but is now appropriate for PO trials. Trials of purees observed with good oral clearance. Cup sips of thin liquids resulted in immediate coughing in 50% of trials. Controlled sips via tspn did not result in further coughing. Discussed with RN. Recommend continuing Dys 1 solids with thin liquids via tspn only. Meds can continue to be given whole in puree. SLP will f/u to complete an MBS as scheduling and pt's agitation allows.    HPI HPI: 77 year old male with chronic HFrEF was initially admitted 11/30 with acute NSTEMI and acute on chronic biventricular HFrEF with moderate to severe MR, underwent CABG x  5 12/19, post-procedure complicated with cardiac stunning and failure to wean from bypass s/p with placement of Impella 5.5. 12/20 back to OR for re-exploration d/t post-op bleeding/ABLA.  Intubated 12/19-20. Cortrak 12/20. Reintubated for procedure only 12/30. Per RN family reports baseline dysphagia      SLP Plan  MBS      Recommendations for follow up therapy are one component of a multi-disciplinary discharge planning process, led by the attending physician.  Recommendations may be updated based on patient status, additional functional criteria and insurance authorization.    Recommendations  Diet recommendations: Dysphagia 1 (puree);Thin liquid Liquids provided via: Teaspoon Medication Administration: Whole meds with puree Supervision: Staff to assist with self feeding Compensations: Minimize environmental distractions Postural Changes and/or Swallow Maneuvers: Seated upright 90 degrees                  Oral care BID   Frequent or constant  Supervision/Assistance Dysphagia, oropharyngeal phase (R13.12)     MBS     Damien Blumenthal, M.A., CF-SLP Speech Language Pathology, Acute Rehabilitation Services  Secure Chat preferred [phone]   03/13/2023, 5:18 PM

## 2023-03-13 NOTE — Progress Notes (Addendum)
 Patient ID: Walter Vargas, male   DOB: 01-Apr-1945, 77 y.o.   MRN: [id number]     Advanced Heart Failure Rounding Note  PCP-Cardiologist: None  Chief Complaint: Cardiogenic Shock  Subjective:   12/19: S/p CABG and Impella 5.5 Insertion. Coagulopathic on return from OR, high chest tube output, required multiple blood products. Subsequent VT associated w/ Impella suction alarms. Back to OR for re-exploratation, bleeding from EPW.  12/23: Bedside echo severe biventricular dysfunction. LVEF 25% RV moderately down. Impella well positioned 12/25: started on Zosyn  for fevers/suspected aspiration PNA. PCT 0.34   POD# 12  Impella removed 12/30  Remains on milrinone  0.3, off NE. MAP stable.  Co-ox 71%, CVP 9. Creatinine 0.75.  I/Os net +607.  Weight up 1lb.     Completed Zosyn  12/30, afebrile.    Confused this morning. Needs frequent redirecting.   Meds:   amiodarone   200 mg Per Tube BID   aspirin   81 mg Per Tube Daily   atorvastatin   80 mg Per Tube Daily   bisacodyl   10 mg Oral Daily   Or   bisacodyl   10 mg Rectal Daily   Chlorhexidine  Gluconate Cloth  6 each Topical Daily   digoxin   0.125 mg Per Tube Daily   docusate  200 mg Per Tube Daily   enoxaparin  (LOVENOX ) injection  40 mg Subcutaneous Q24H   feeding supplement  237 mL Oral BID BM   feeding supplement (PROSource TF20)  60 mL Per Tube Daily   feeding supplement (VITAL 1.5 CAL)  660 mL Per Tube Q24H   Gerhardt's butt cream   Topical Daily   guaiFENesin   15 mL Per Tube Q6H   insulin  aspart  0-9 Units Subcutaneous TID WC   mouth rinse  15 mL Mouth Rinse QID   pantoprazole  (PROTONIX ) IV  40 mg Intravenous QHS   QUEtiapine   25 mg Per Tube BID   sodium chloride  flush  10-40 mL Intracatheter Q12H   [START ON 03/14/2023] thiamine   100 mg Oral Daily    dexmedetomidine  (PRECEDEX ) IV infusion Stopped (03/13/23 0521)   milrinone  0.3 mcg/kg/min (03/13/23 1100)   norepinephrine  (LEVOPHED ) Adult infusion 1 mcg/min (03/13/23 1100)   sodium  bicarbonate 25 mEq (Impella PURGE) in dextrose  5 % 1000 mL bag     thiamine  (VITAMIN B1) injection Stopped (03/13/23 0554)     Objective:   Weight Range: 54.5 kg Body mass index is 18.27 kg/m.   Vital Signs:   Temp:  [97.3 F (36.3 C)-99.7 F (37.6 C)] 99.5 F (37.5 C) (12/31 1145) Pulse Rate:  [78-91] 86 (12/31 1145) Resp:  [15-29] 24 (12/31 1145) BP: (106-118)/(56-68) 114/60 (12/31 0800) SpO2:  [83 %-100 %] 98 % (12/31 1145) Arterial Line BP: (72-135)/(30-64) 123/43 (12/31 1145) Weight:  [54.5 kg] 54.5 kg (12/31 0530) Last BM Date : 03/12/23  Weight change: Filed Weights   03/11/23 0513 03/12/23 0430 03/13/23 0530  Weight: 59.6 kg 54 kg 54.5 kg   Intake/Output:   Intake/Output Summary (Last 24 hours) at 03/13/2023 1200 Last data filed at 03/13/2023 1100 Gross per 24 hour  Intake 1827.21 ml  Output 1500 ml  Net 327.21 ml    Physical Exam  CVP 9 General:  elderly appearing.  No respiratory difficulty HEENT: normal. Swann RIJ  Neck: supple. JVD ~8 cm. Carotids 2+ bilat; no bruits. No lymphadenopathy or thyromegaly appreciated. Cor: PMI nondisplaced. Regular rate & rhythm. No rubs, gallops or murmurs. Lungs: clear Abdomen: soft, nontender, nondistended. No hepatosplenomegaly. No bruits or  masses. Good bowel sounds. Extremities: no cyanosis, clubbing, rash, edema. L brachial a line. PICC LUE Neuro: confused  Telemetry   NSR 80s (Personally reviewed)    Patient Profile   Walter Vargas is a 77 y.o. history of HTN, Walter, HFrEF, and severe 3V CAD. S/p CABG and Impella 5.5 Insertion 12/19 w Dr. Kerrin.   Assessment/Plan  1.  Cardiogenic Shock:  Patient has had HF symptoms for at least a couple of months.  Suspect pre-existing ischemic cardiomyopathy then he presented this admission with NSTEMI possible in D2 territory leading to CHF exacerbation. RHC 12/3 showed normal filling pressures and low CI at 2.0. Pre CABG echo EF < 20%, RV moderately reduced (PAPi was  adequate on RHC)Intra-op course complicated by cardiac stun and failure to wean from bypass. Impella 5.5 placed. Intra-op TEE EF 20%, RV mildly reduced.  POCUS 12/25 EF 25-30% RV improved. Impella well positioned.  Impella 5.5 @ P3 this morning. Flow 2.2 L. No alarms. On Milrinone  0.3.  Co-ox 71%.  - CVP 9, weight up 1lb. Will give 20 PO lasix  today.  - Impella removed 12/30 - Decrease milrinone  0.3>0.25. NE off earlier today.  - Continue digoxin  0.125.   2. CAD: Patient admitted with NSTEMI (?culprit D2) but with severe extensive diffuse 3 vessel disease.  Disease too extensive to treat percutaneously.  Anatomy felt best served by CABG.  s/p CABG and Impella 5.5 insertion w Dr. Kerrin 12/19. - Continue ASA 81  - Continue atorva 80  3. Post-Operative Bleeding/ ABLA: Coagulopathic on return from OR. Supported with multiple units of blood products including RBCs, cryo, FFP and Factor VII + albumin  and protamine , had to return to the OR. Bleeding noted around chest tubes.  Now stable. CTs pulled.  - Hgb 8.8  4. 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  Patient has had on and off confusion in hospital. Appropriate this morning.  - Continue Seroquel  bid.   5. ID:  Suspected Aspiration PNA.  PCT 0.34. Zosyn  started 12/25 - Continue Zosyn  until Impella pulled.   6. COVID+: Completed Paxlovid   7. Carotid artery stenosis: Pre-CABG dopplers with 80-99% left ICA stenosis. Seen by [facility] - Planning for outpatient follow-up in 1 month (post CABG) with CTA head and neck.  8. DVT prophylaxis: Enoxaparin .  Right lower leg asymmetric edema => venous dopplers negative for DVT.   9. FEN: nocturnal TFs.  Not eating much, does not like dysphagia diet.  - Speech following  10. H/o VT: Peri-CABG.  - Continue amiodarone  200 mg bid.   11. Hyponatremia: Had tolvaptan  12/29, Na up to 136 today.   12. Need to mobilize.   Beckey LITTIE Coe, NP  12:00 PM  Patient seen with NP, agree with the above note.    NE now off, milrinone  decreased to 0.25.  Co-ox 71%, CI 2.8.  CVP 9-10.    Feels good, walked in hall today.  Seems to be eating better.  He is alert/oriented.   General: NAD, thin Neck: JVP 8-9 cm, no thyromegaly or thyroid nodule.  Lungs: Clear to auscultation bilaterally with normal respiratory effort. CV: Nondisplaced PMI.  Heart regular S1/S2, no S3/S4, no murmur.  No peripheral edema.    Abdomen: Soft, nontender, no hepatosplenomegaly, no distention.  Skin: Intact without lesions or rashes.  Neurologic: Alert and oriented x 3.  Psych: Normal affect. Extremities: No clubbing or cyanosis.  HEENT: Normal.   Can pull Swan today, follow CVP and co-ox from PICC.  Will slowly wean  milrinone  and add GDMT.  Continue digoxin , add spironolactone  12.5 daily.  Will give Lasix  40 mg IV x 1 today.    Continue to mobilize.   Ezra Shuck 03/13/2023 1:50 PM

## 2023-03-13 NOTE — Progress Notes (Signed)
 SLP Cancellation Note  Patient Details Name: Walter Vargas MRN: [id number] DOB: [birth year]   Cancelled treatment:       Reason Eval/Treat Not Completed: RN deferred MBS due to agitation. SLP will f/u.    Damien Blumenthal, M.A., CF-SLP Speech Language Pathology, Acute Rehabilitation Services  Secure Chat preferred [phone]  03/13/2023, 5:16 PM

## 2023-03-13 NOTE — Progress Notes (Signed)
Patient ID: Walter Vargas, male   DOB: 1945/07/21, 77 y.o.   MRN: [id number]  TCTS Evening Rounds:  Hemodynamically stable on milrinone 0.25. off NE.  UO good.   I will remove the pacing wires myself in the am.

## 2023-03-13 NOTE — Progress Notes (Signed)
 PT Cancellation Note  Patient Details Name: Walter Vargas MRN: [id number] DOB: 1945-09-17   Cancelled Treatment:    Reason Eval/Treat Not Completed: Other (comment) (pt just starting lunch, will return)   [name] 03/13/2023, 11:28 AM Lenoard SQUIBB, PT Acute Rehabilitation Services Office: [phone]

## 2023-03-13 NOTE — Progress Notes (Signed)
 Physical Therapy Treatment Patient Details Name: Walter Vargas MRN: [id number] DOB: 08-18-1945 Today's Date: 03/13/2023   History of Present Illness 77 y.o. male admitted 02/13/22 with NSTEMI, CHF exacerbation. LHC with 3-vessel CAD. 12/9 Pt with fall in room. CABG delayed due to covid +. 12/19 S/p CABG x 5, TEE, and placement of impella and swan-ganz. Coagulopathic on return from OR, high chest tube output, required multiple blood products. 12/20 Back to OR for re-exploratation, bleeding from EPW. ETT 12/19 - 12/20. Chest tubes 12/19 - 12/21. Cortrak placed 12/20. 12/30 impella removed, swan placed. 12/31 swan out.  PMHx: HTN, HF, mod/severe MR.    PT Comments  Pt with confusion and perseveration on items in room. Pt with mod assist for standing and gait due to continued posterior bias/retropulsion. Pt able to progress gait distance with assist and +2 support for lines and safety. Pt educated for sternal precautions, transfers, HEP and progression with plan still appropriate. Patient will benefit from intensive inpatient follow up therapy, >3 hours/day  HR 88-92 Pre gait 110/59 (75) Post gait 116/70 (84) 96% on RA    If plan is discharge home, recommend the following: Assistance with cooking/housework;Assist for transportation;Supervision due to cognitive status;A lot of help with walking and/or transfers;Help with stairs or ramp for entrance;A lot of help with bathing/dressing/bathroom   Can travel by private vehicle        Equipment Recommendations  BSC/3in1    Recommendations for Other Services       Precautions / Restrictions Precautions Precautions: Fall;Sternal     Mobility  Bed Mobility Overal bed mobility: Needs Assistance Bed Mobility: Supine to Sit     Supine to sit: Mod assist, HOB elevated     General bed mobility comments: HOB 40 degrees with mod assist to pivot to EOB and scoot fully to EOB, mod cues for sequence and safety with frequent need to redirect to  task    Transfers Overall transfer level: Needs assistance   Transfers: Sit to/from Stand Sit to Stand: Mod assist           General transfer comment: mod assist to rise from bed with max cues for hand placement and significant retropulsion with rise needing feet blocked and physical assist to achieve standing    Ambulation/Gait Ambulation/Gait assistance: Mod assist, +2 safety/equipment Gait Distance (Feet): 200 Feet Assistive device: Rolling walker (2 wheels) Gait Pattern/deviations: Step-through pattern, Decreased stride length, Leaning posteriorly, Narrow base of support, Trunk flexed   Gait velocity interpretation: <1.8 ft/sec, indicate of risk for recurrent falls   General Gait Details: pt with flexed trunk, posterior bias, veering right and mod physical assist to maintain anterior progression and momentum. 3 standing rests with max cues for direction and +2 assist for lines/safety   Stairs             Wheelchair Mobility     Tilt Bed    Modified Rankin (Stroke Patients Only)       Balance Overall balance assessment: Needs assistance Sitting-balance support: No upper extremity supported, Feet supported Sitting balance-Leahy Scale: Fair     Standing balance support: Bilateral upper extremity supported, Reliant on assistive device for balance Standing balance-Leahy Scale: Poor Standing balance comment: mod assist for standing balance with reliance on RW                            Cognition Arousal: Alert Behavior During Therapy: Flat affect Overall Cognitive Status: Impaired/Different  from baseline Area of Impairment: Attention, Memory, Safety/judgement, Following commands, Awareness, Problem solving, Orientation                 Orientation Level: Disoriented to, Time, Situation Current Attention Level: Sustained Memory: Decreased short-term memory, Decreased recall of precautions Following Commands: Follows one step commands with  increased time, Follows one step commands inconsistently Safety/Judgement: Decreased awareness of safety, Decreased awareness of deficits Awareness: Intellectual Problem Solving: Slow processing, Decreased initiation, Difficulty sequencing, Requires verbal cues, Requires tactile cues General Comments: pt perseverating on table and water placement in room, no awareness of precautions despite education during session needing max cues to maintain        Exercises General Exercises - Lower Extremity Long Arc Quad: AROM, Both, 15 reps, Seated Hip Flexion/Marching: AROM, Both, 10 reps, Seated    General Comments        Pertinent Vitals/Pain Pain Assessment Pain Assessment: No/denies pain    Home Living                          Prior Function            PT Goals (current goals can now be found in the care plan section) Progress towards PT goals: Progressing toward goals    Frequency    Min 1X/week      PT Plan      Co-evaluation              AM-PAC PT 6 Clicks Mobility   Outcome Measure  Help needed turning from your back to your side while in a flat bed without using bedrails?: A Little Help needed moving from lying on your back to sitting on the side of a flat bed without using bedrails?: A Lot Help needed moving to and from a bed to a chair (including a wheelchair)?: A Lot Help needed standing up from a chair using your arms (e.g., wheelchair or bedside chair)?: A Lot Help needed to walk in hospital room?: Total Help needed climbing 3-5 steps with a railing? : Total 6 Click Score: 11    End of Session Equipment Utilized During Treatment: Gait belt Activity Tolerance: Patient tolerated treatment well Patient left: in chair;with call bell/phone within reach;with chair alarm set;with nursing/sitter in room Nurse Communication: Mobility status;Precautions PT Visit Diagnosis: Other abnormalities of gait and mobility (R26.89);Unsteadiness on feet  (R26.81);Muscle weakness (generalized) (M62.81);Difficulty in walking, not elsewhere classified (R26.2)     Time: 1202-1242 PT Time Calculation (min) (ACUTE ONLY): 40 min  Charges:    $Gait Training: 8-22 mins $Therapeutic Exercise: 8-22 mins $Therapeutic Activity: 8-22 mins PT General Charges $$ ACUTE PT VISIT: 1 Visit                     Lenoard SQUIBB, PT Acute Rehabilitation Services Office: [phone]    Lenoard NOVAK Aiman [name] 03/13/2023, 1:36 PM

## 2023-03-13 NOTE — Progress Notes (Signed)
 Nutrition Follow-up  DOCUMENTATION CODES:   Severe malnutrition in context of chronic illness  INTERVENTION:   Continue nocturnal TF for now, pt ate well at lunch today with assistance from family but did not eat breakfast this AM or any meals this weekend. If pt continues to eat well at meals and taking supplements, ok to remove Cortrak  Increase Ensure Enlive po to TID, each supplement provides 350 kcal and 20 grams of protein.  Magic cup BID at lunch and dinner, each supplement provides 290 kcal and 9 grams of protein   Tube Feeding via Cortrak:  Vital 1.5 at 55 ml/hr x 12 hours TF provides 990 kcals, 45 g of protein and 502 mL of free water  NUTRITION DIAGNOSIS:   Severe Malnutrition related to chronic illness (HFrEF, 3V CAD, MR) as evidenced by severe fat depletion, severe muscle depletion.  Continues but being addressed via supplements, nocturnal TF  GOAL:   Patient will meet greater than or equal to 90% of their needs  Progressing  MONITOR:   PO intake, Supplement acceptance, TF tolerance, Skin, Weight trends, Labs  REASON FOR ASSESSMENT:   Ventilator    ASSESSMENT:   Pt remains admitted with heart failure, NSTEMI and cardiogenic shock. PMH significant for HTN, MR, HFrEF and severe 3V CAD.  11/30: admitted 12/19: s/p CABG and impella 5/5 placement 12/20: back to OR for re-exploration d/t post-op bleeding/ABLA; cortrak placed, trickle TF initiated, extubated 12/26 Diet advanced to Dysphagia 1, Thins per SLP 12/27 Changed to nocturnal TF 12/30 OR for Impella removal, Cortrak inadvertently removed during TEE and replaced postop  Pt is alert but confused at times. Noted pt with significant confusion/agitation this weekend.   RD present during lunch meal today; pt stating I have got to eat. Family friend at bedside and pt eating puree beef and mashed potatoes with gravy. By the end of the meal, pt had consumed almost all of the meat and potatoes, at least 75%  of Magic Cup and bites of pears and corn.  RN reports pt did not eat breakfast this AM and did not eat anything this weekend. RN indicates this is the first time she has seen patient eat. Pt has been drinking Ensures well; plan to increase frequency  Noted hypoglycemic episode yesterday; likely related to TF being on hold. No episodes since  Tolerating nocturnal TF via Cortrak x 12 hours  Pt receiving high dose thiamine  supplementation  Noted +BMs (type 6-7), rectal tube out  Labs: CBGs 55-116, sodium 136 (wdl) Meds: ss novolog , colace daily   Diet Order:   Diet Order             DIET - DYS 1 Room service appropriate? Yes; Fluid consistency: Thin  Diet effective now                   EDUCATION NEEDS:   No education needs have been identified at this time  Skin:  Skin Assessment: Skin Integrity Issues: Skin Integrity Issues:: Incisions Incisions: Chest, R leg inicision closed  Last BM:  12/31 type 6/7  Height:   Ht Readings from Last 1 Encounters:  03/13/23 5' 8 (1.727 m)    Weight:   Wt Readings from Last 1 Encounters:  03/13/23 54.5 kg     BMI:  Body mass index is 18.27 kg/m.  Estimated Nutritional Needs:   Kcal:  1900-2200 kcals  Protein:  90-110 g  Fluid:  1.7L   Betsey Finger MS, RDN, LDN, CNSC Registered  Dietitian 3 Clinical Nutrition RD Inpatient Contact Info in Amion

## 2023-03-13 NOTE — Progress Notes (Signed)
 Occupational Therapy Treatment Patient Details Name: Walter Vargas MRN: [id number] DOB: 22-Feb-1946 Today's Date: 03/13/2023   History of present illness 77 y.o. male admitted 02/13/22 with NSTEMI, CHF exacerbation. LHC with 3-vessel CAD. 12/9 Pt with fall in room. CABG delayed due to covid +. 12/19 S/p CABG x 5, TEE, and placement of impella and swan-ganz. Coagulopathic on return from OR, high chest tube output, required multiple blood products. 12/20 Back to OR for re-exploratation, bleeding from EPW. ETT 12/19 - 12/20. Chest tubes 12/19 - 12/21. Cortrak placed 12/20. 12/30 impella removed, swan placed. 12/31 swan out.  PMHx: HTN, HF, mod/severe MR.   OT comments  Pt progressing toward established OT goals. Pt confused on arrival perseverating on location of table/water and desire to go home. Pt needing max multimodal cues to both recall and maintain sternal precautions this session. Focus session on cognition, following commands, and functional implementation of transfers. Pt needing mod A +2 for safety for transfers and max multimodal cues. Recommending intensive multidisciplinary rehabilitation >3 hours/day to optimize safety and independence in ADL.        If plan is discharge home, recommend the following:  Two people to help with walking and/or transfers;Two people to help with bathing/dressing/bathroom;Assistance with cooking/housework;Direct supervision/assist for medications management;Direct supervision/assist for financial management;Assist for transportation;Help with stairs or ramp for entrance;Assistance with feeding   Equipment Recommendations  Other (comment) (defer)    Recommendations for Other Services      Precautions / Restrictions Precautions Precautions: Fall;Sternal Restrictions Weight Bearing Restrictions Per Provider Order: Yes (sternal precautions) RUE Weight Bearing Per Provider Order: Non weight bearing LUE Weight Bearing Per Provider Order: Non weight  bearing Other Position/Activity Restrictions: sternal precautions       Mobility Bed Mobility Overal bed mobility: Needs Assistance Bed Mobility: Sit to Sidelying, Rolling Rolling: Min assist       Sit to sidelying: Mod assist General bed mobility comments: mod A to bring BLE into bed    Transfers Overall transfer level: Needs assistance Equipment used: Rolling walker (2 wheels) Transfers: Sit to/from Stand Sit to Stand: Mod assist           General transfer comment: mod A to rise and maintain static standing     Balance Overall balance assessment: Needs assistance Sitting-balance support: No upper extremity supported, Feet supported Sitting balance-Leahy Scale: Fair   Postural control: Posterior lean Standing balance support: Bilateral upper extremity supported, Reliant on assistive device for balance Standing balance-Leahy Scale: Poor Standing balance comment: mod assist for standing balance with reliance on RW                           ADL either performed or assessed with clinical judgement   ADL Overall ADL's : Needs assistance/impaired     Grooming: Oral care;Sitting;Contact guard assist Grooming Details (indicate cue type and reason): max set-up                 Toilet Transfer: Moderate assistance;+2 for physical assistance;+2 for safety/equipment;Stand-pivot Toilet Transfer Details (indicate cue type and reason): Poor command following throughout transfer and signifciant retropulsion in standing requiring constant mod A Toileting- Clothing Manipulation and Hygiene: Total assistance;+2 for physical assistance;+2 for safety/equipment Toileting - Clothing Manipulation Details (indicate cue type and reason): posterior pericare provided by nursing staff            Extremity/Trunk Assessment Upper Extremity Assessment Upper Extremity Assessment: Generalized weakness   Lower Extremity Assessment  Lower Extremity Assessment: Generalized  weakness        Vision   Additional Comments: to continue to assess   Perception     Praxis      Cognition Arousal: Alert Behavior During Therapy: Flat affect Overall Cognitive Status: Impaired/Different from baseline Area of Impairment: Attention, Memory, Safety/judgement, Following commands, Awareness, Problem solving, Orientation                 Orientation Level: Disoriented to, Time, Situation Current Attention Level: Sustained Memory: Decreased short-term memory, Decreased recall of precautions Following Commands: Follows one step commands with increased time, Follows one step commands inconsistently Safety/Judgement: Decreased awareness of safety, Decreased awareness of deficits Awareness: Intellectual Problem Solving: Slow processing, Decreased initiation, Difficulty sequencing, Requires verbal cues, Requires tactile cues General Comments: pt perseverating on table and water placement in room as well as being ready to go home, no awareness of precautions despite education during session needing max cues to maintain and to recall at end of session. Poor awareness of current abilities. Soiled in chair on arrival with no awareness.        Exercises      Shoulder Instructions       General Comments      Pertinent Vitals/ Pain       Pain Assessment Pain Assessment: No/denies pain  Home Living                                          Prior Functioning/Environment              Frequency  Min 1X/week        Progress Toward Goals  OT Goals(current goals can now be found in the care plan section)  Progress towards OT goals: Progressing toward goals  Acute Rehab OT Goals OT Goal Formulation: With patient Time For Goal Achievement: 03/19/23 Potential to Achieve Goals: Good ADL Goals Pt Will Perform Grooming: with set-up;sitting Pt Will Perform Upper Body Dressing: with min assist;sitting Pt Will Perform Lower Body Dressing:  with mod assist;sit to/from stand Pt Will Transfer to Toilet: with mod assist;stand pivot transfer  Plan      Co-evaluation                 AM-PAC OT 6 Clicks Daily Activity     Outcome Measure   Help from another person eating meals?: A Lot Help from another person taking care of personal grooming?: A Lot Help from another person toileting, which includes using toliet, bedpan, or urinal?: Total Help from another person bathing (including washing, rinsing, drying)?: A Lot Help from another person to put on and taking off regular upper body clothing?: A Lot Help from another person to put on and taking off regular lower body clothing?: Total 6 Click Score: 10    End of Session Equipment Utilized During Treatment: Gait belt;Rolling walker (2 wheels)  OT Visit Diagnosis: Unsteadiness on feet (R26.81);Muscle weakness (generalized) (M62.81);Other symptoms and signs involving cognitive function   Activity Tolerance Patient tolerated treatment well;Other (comment) (limited by confusion)   Patient Left in bed;with call bell/phone within reach;with bed alarm set;with nursing/sitter in room   Nurse Communication Mobility status        Time: 1452-1510 OT Time Calculation (min): 18 min  Charges: OT General Charges $OT Visit: 1 Visit OT Treatments $Self Care/Home Management : 8-22 mins  Elma JONETTA Penner,  OTD, OTR/L [facility] Acute Rehabilitation Office: [phone]   Elma JONETTA Penner 03/13/2023, 4:22 PM

## 2023-03-13 NOTE — Progress Notes (Addendum)
 [facility] DAILY ICU PROGRESS NOTE                   [address]            [place] [address]          [phone]   1 Day Post-Op Procedure(s) (LRB): REMOVAL OF IMPELLA LEFT VENTRICULAR ASSIST DEVICE (N/A) TRANSESOPHAGEAL ECHOCARDIOGRAM (TEE) (N/A)  Total Length of Stay:  LOS: 27 days   Subjective: Patient states his mouth is dry and he is thirsty this am. He is oriented to person and place  Objective: Vital signs in last 24 hours: Temp:  [97 F (36.1 C)-99.7 F (37.6 C)] 99.1 F (37.3 C) (12/31 0700) Pulse Rate:  [75-91] 85 (12/31 0700) Cardiac Rhythm: Normal sinus rhythm (12/30 1945) Resp:  [14-29] 16 (12/31 0700) BP: (106-129)/(56-68) 110/65 (12/31 0400) SpO2:  [94 %-100 %] 96 % (12/31 0700) Arterial Line BP: (72-149)/(30-64) 122/44 (12/31 0700) FiO2 (%):  [40 %-50 %] 40 % (12/30 1114) Weight:  [54.5 kg] 54.5 kg (12/31 0530)  Filed Weights   03/11/23 0513 03/12/23 0430 03/13/23 0530  Weight: 59.6 kg 54 kg 54.5 kg    Weight change: 0.5 kg   Hemodynamic parameters for last 24 hours: PAP: (17-36)/(7-30) 28/24 CVP:  [0 mmHg-17 mmHg] 7 mmHg CO:  [4.1 L/min-6.1 L/min] 4.8 L/min CI:  [2.46 L/min/m2-3.7 L/min/m2] 2.89 L/min/m2  Intake/Output from previous day: 12/30 0701 - 12/31 0700 In: 2157.5 [I.V.:613.6; NG/GT:853.8; IV Piggyback:690.2] Out: 1550 [Urine:1550]  Intake/Output this shift: No intake/output data recorded.  Current Meds: Scheduled Meds:  amiodarone   200 mg Per Tube BID   aspirin   81 mg Per Tube Daily   atorvastatin   80 mg Per Tube Daily   bisacodyl   10 mg Oral Daily   Or   bisacodyl   10 mg Rectal Daily   Chlorhexidine  Gluconate Cloth  6 each Topical Daily   digoxin   0.125 mg Per Tube Daily   docusate  200 mg Per Tube Daily   enoxaparin  (LOVENOX ) injection  40 mg Subcutaneous Q24H   feeding supplement  237 mL Oral BID BM   feeding supplement (PROSource TF20)  60 mL Per Tube Daily   feeding supplement (VITAL 1.5 CAL)  660 mL Per  Tube Q24H   Gerhardt's butt cream   Topical Daily   guaiFENesin   15 mL Per Tube Q6H   insulin  aspart  0-9 Units Subcutaneous TID WC   mouth rinse  15 mL Mouth Rinse QID   pantoprazole  (PROTONIX ) IV  40 mg Intravenous QHS   QUEtiapine   25 mg Per Tube BID   sodium chloride  flush  10-40 mL Intracatheter Q12H   [START ON 03/14/2023] thiamine   100 mg Oral Daily   Continuous Infusions:  dexmedetomidine  (PRECEDEX ) IV infusion Stopped (03/13/23 0521)   milrinone  0.3 mcg/kg/min (03/13/23 0700)   norepinephrine  (LEVOPHED ) Adult infusion 2 mcg/min (03/13/23 0700)   sodium bicarbonate  25 mEq (Impella PURGE) in dextrose  5 % 1000 mL bag     thiamine  (VITAMIN B1) injection Stopped (03/13/23 0554)   PRN Meds:.sodium chloride , acetaminophen  (TYLENOL ) oral liquid 160 mg/5 mL, diphenhydrAMINE , fentaNYL , morphine  injection, ondansetron  (ZOFRAN ) IV, mouth rinse, oxyCODONE , sodium chloride , sodium chloride  flush  General appearance: cooperative and no distress Neurologic: intact Heart: RRR, no murmur Lungs: Mostly clear Abdomen: Soft, non tender, sporadic bowel sounds this am Extremities: No LE edema. Mild edema right ankle. Ecchymosis right thigh/leg Wounds:Sternal wound is clean, dry, no sign of infection. Eschars on right  LE ([facility])  Lab Results: CBC: Recent Labs    03/12/23 0727 03/12/23 1001 03/12/23 1310 03/13/23 0442  WBC 16.1*  --   --  13.2*  HGB 8.9*   < > 9.2* 8.4*  HCT 26.8*   < > 27.0* 26.2*  PLT 358  --   --  387   < > = values in this interval not displayed.   BMET:  Recent Labs    03/12/23 0440 03/12/23 1001 03/12/23 1310 03/13/23 0442  NA 130*   < > 135 136  K 3.6   < > 4.1 3.9  CL 92*  --   --  105  CO2 23  --   --  25  GLUCOSE 275*  --   --  148*  BUN 26*  --   --  26*  CREATININE 0.94  --   --  0.75  CALCIUM  7.9*  --   --  8.2*   < > = values in this interval not displayed.    CMET: Lab Results  Component Value Date   WBC 13.2 (H) 03/13/2023   HGB 8.4 (L)  03/13/2023   HCT 26.2 (L) 03/13/2023   PLT 387 03/13/2023   GLUCOSE 148 (H) 03/13/2023   CHOL 90 02/11/2023   TRIG 45 02/11/2023   HDL 38 (L) 02/11/2023   LDLCALC 43 02/11/2023   ALT 11 03/03/2023   AST 39 03/03/2023   NA 136 03/13/2023   K 3.9 03/13/2023   CL 105 03/13/2023   CREATININE 0.75 03/13/2023   BUN 26 (H) 03/13/2023   CO2 25 03/13/2023   TSH 4.481 02/11/2023   PSA 0.74 06/21/2022   INR 1.6 (H) 03/03/2023   HGBA1C 5.9 (H) 02/28/2023      PT/INR: No results for input(s): LABPROT, INR in the last 72 hours. Radiology: DG Abd Portable 1V Result Date: 03/12/2023 CLINICAL DATA:  Feeding tube placement. EXAM: PORTABLE ABDOMEN - 1 VIEW COMPARISON:  March 02, 2023. FINDINGS: Distal tip of feeding tube is seen in expected position of distal stomach. IMPRESSION: Distal tip of feeding tube is seen in expected position of distal stomach. Electronically Signed   By: Lynwood Landy Raddle M.D.   On: 03/12/2023 13:20   ECHO INTRAOPERATIVE TEE Result Date: 03/12/2023  *INTRAOPERATIVE TRANSESOPHAGEAL REPORT *  Patient Name:   Walter Vargas  Date of Exam: 03/12/2023 Medical Rec #:  [id number]      Height:       68.0 in Accession #:    [id number]     Weight:       119.0 lb Date of Birth:  1945/11/07      BSA:          1.64 m Patient Age:    77 years       BP:           123/65 mmHg Patient Gender: M              HR:           85 bpm. Exam Location:  Anesthesiology Transesophogeal exam was perform intraoperatively during surgical procedure. Patient was closely monitored under general anesthesia during the entirety of examination. Indications:     CHF Performing Phys: [id number] STEVEN C HENDRICKSON Complications: No known complications during this procedure. PRE-OP FINDINGS  Left Ventricle: The left ventricle 20. The cavity size was severely dilated. There is no increase in left ventricular wall thickness. Left ventricular diffuse hypokinesis. Right Ventricle: The right ventricle  has moderately reduced  systolic function. The cavity was dialated. There is no increase in right ventricular wall thickness. Left Atrium: Left atrial size was not assessed. No left atrial/left atrial appendage thrombus was detected. Right Atrium: Right atrial size was not assessed. Interatrial Septum: No atrial level shunt detected by color flow Doppler. Pericardium: There is no evidence of pericardial effusion. Mitral Valve: The mitral valve is normal in structure. Mitral valve regurgitation is trivial by color flow Doppler. There is No evidence of mitral stenosis. Tricuspid Valve: The tricuspid valve was normal in structure. Tricuspid valve regurgitation is trivial by color flow Doppler. Aortic Valve: The aortic valve is normal in structure. Aortic valve regurgitation was not visualized by color flow Doppler. There is no stenosis of the aortic valve. Pulmonic Valve: The pulmonic valve was normal in structure. Pulmonic valve regurgitation is trivial by color flow Doppler.  Elsie Needle MD Electronically signed by Elsie Needle MD Signature Date/Time: 03/12/2023/9:39:43 AM   Final     Assessment/Plan: S/P Procedure(s) (LRB): REMOVAL OF IMPELLA LEFT VENTRICULAR ASSIST DEVICE (N/A) TRANSESOPHAGEAL ECHOCARDIOGRAM (TEE) (N/A) CV-S/p NSTEMI. S/p removal of Impella 12/30. SR this am. LVEF prior to surgery < 20%. Co ox this am 71.4. On Milrinone  0.3 and Nor Epinephrine  at 2, Amiodarone  200 mg bid via tube and Digoxin  0.125 via tube. On Amiodarone  for history of VT.  EPW remain for now. Pulmonary-on room air this am. Will order CXR for am . Encourage incentive spirometer and flutter valve Expected post op blood loss anemia-H and H this am decreased to 8.4 and 26.2 HFrEF-CVP 6. Advanced heart failure following and will determine diuresis needs 5. CBGs 91/96/112. Pre op HGA1C 5.9. 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 Will continue accu checks, SS PRN until tolerating PO 6. Supplement potassium 7. GI-Cortrak in place. Tolerating tube  feedings at 60 ml/hr. Also, on dysphagia I diet. Speech pathology following. 8. On Lovenox  for DVT prophylaxis 9. Delirium-He most likely has underlying dementia, continue Seroquel  and Thiamine  10. Deconditioned-continue PT  Donielle CHRISTELLA Donald PA-C 03/13/2023 7:41 AM  CT Surgery Agree with above assessment and plan  Patient examined, yesterdays intra-op TEE personally reviewed. Stable postop Impella 5.5 removal, neck site dry w/o hematoma. Patient on low dose milrinone  and NE - wean per HF, remove Swan when possible to promote mobility Surgical incisions clean, dry   P Fleeta Ochoa MD

## 2023-03-13 NOTE — Progress Notes (Signed)
 Speech Language Pathology Treatment: Dysphagia  Patient Details Name: Walter Vargas MRN: [id number] DOB: 1946/02/18 Today's Date: 03/13/2023 Time: 9067-9056 SLP Time Calculation (min) (ACUTE ONLY): 11 min  Assessment / Plan / Recommendation Clinical Impression  Pt seen for ongoing dysphagia management. Pt intubated briefly for procedure yesterday.  He apparently does not like modified diet and would like to advance textures.  Today pt exhibited clinical s/s of aspiration with thin and nectar thick liquids.  There was immediate cough of 50% of trials of thin liquid.  Cough less frequent, but still present with nectar thick liquid.  Single sips decreased cough frequency but did not eliminate cough.  RN shared that family reports baseline dysphagia and were concerned this admission for  aspiration pneumonia.  Pt would benefit from instrumental assessment to further assess pharyngeal swallow function.  Pt tolerated puree and regular texture solids and exhibited adequate oral clearance.  There was trace-mild residue with solids. Pt is likely to be able to safely advance diet texture, but will wait for results of instrumental study prior to making diet recommendations.  MBSS preferred over FEES 2/2 delirium this admission and probable underlying dementia.     HPI HPI: 77 year old male with chronic HFrEF was initially admitted 11/30 with acute NSTEMI and acute on chronic biventricular HFrEF with moderate to severe MR, underwent CABG x  5 12/19, post-procedure complicated with cardiac stunning and failure to wean from bypass s/p with placement of Impella 5.5. 12/20 back to OR for re-exploration d/t post-op bleeding/ABLA.  Intubated 12/19-20. Cortrak 12/20. Reintubated for procedure only 12/30. Per RN family reports baseline dysphagia      SLP Plan  MBS      Recommendations for follow up therapy are one component of a multi-disciplinary discharge planning process, led by the attending physician.   Recommendations may be updated based on patient status, additional functional criteria and insurance authorization.    Recommendations  Diet recommendations: Dysphagia 1 (puree) Medication Administration: Whole meds with puree                  Oral care BID     Dysphagia, oropharyngeal phase (R13.12)     MBS     Anette FORBES Grippe, MA, CCC-SLP Acute Rehabilitation Services Office: [phone] 03/13/2023, 10:01 AM

## 2023-03-13 NOTE — Plan of Care (Signed)
  Problem: Coping: Goal: Level of anxiety will decrease Outcome: Not Progressing   

## 2023-03-14 ENCOUNTER — Inpatient Hospital Stay (HOSPITAL_COMMUNITY): Payer: 59

## 2023-03-14 LAB — LACTATE DEHYDROGENASE: LDH: 341 U/L — ABNORMAL HIGH (ref 98–192)

## 2023-03-14 LAB — RENAL FUNCTION PANEL
Albumin: 2.2 g/dL — ABNORMAL LOW (ref 3.5–5.0)
Anion gap: 9 (ref 5–15)
BUN: 35 mg/dL — ABNORMAL HIGH (ref 8–23)
CO2: 21 mmol/L — ABNORMAL LOW (ref 22–32)
Calcium: 8.3 mg/dL — ABNORMAL LOW (ref 8.9–10.3)
Chloride: 102 mmol/L (ref 98–111)
Creatinine, Ser: 0.8 mg/dL (ref 0.61–1.24)
GFR, Estimated: >60 mL/min (ref >60)
Glucose, Bld: 137 mg/dL — ABNORMAL HIGH (ref 70–99)
Phosphorus: 2.7 mg/dL (ref 2.5–4.6)
Potassium: 4.5 mmol/L (ref 3.5–5.1)
Sodium: 132 mmol/L — ABNORMAL LOW (ref 135–145)

## 2023-03-14 LAB — COOXEMETRY PANEL
Carboxyhemoglobin: 3 % — ABNORMAL HIGH (ref 0.5–1.5)
Carboxyhemoglobin: 2.6 % — ABNORMAL HIGH (ref 0.5–1.5)
Carboxyhemoglobin: 2 % — ABNORMAL HIGH (ref 0.5–1.5)
Methemoglobin: 0.7 % (ref 0.0–1.5)
Methemoglobin: <0.7 % (ref 0.0–1.5)
Methemoglobin: 0.8 % (ref 0.0–1.5)
O2 Saturation: 97.9 %
O2 Saturation: 52.3 %
O2 Saturation: 33.5 %
Total hemoglobin: 8.6 g/dL — ABNORMAL LOW (ref 12.0–16.0)
Total hemoglobin: 8.9 g/dL — ABNORMAL LOW (ref 12.0–16.0)
Total hemoglobin: 8.2 g/dL — ABNORMAL LOW (ref 12.0–16.0)

## 2023-03-14 LAB — CBC
HCT: 26.1 % — ABNORMAL LOW (ref 39.0–52.0)
Hemoglobin: 8.3 g/dL — ABNORMAL LOW (ref 13.0–17.0)
MCH: 28.5 pg (ref 26.0–34.0)
MCHC: 31.8 g/dL (ref 30.0–36.0)
MCV: 89.7 fL (ref 80.0–100.0)
Platelets: 455 10*3/uL — ABNORMAL HIGH (ref 150–400)
RBC: 2.91 MIL/uL — ABNORMAL LOW (ref 4.22–5.81)
RDW: 17.1 % — ABNORMAL HIGH (ref 11.5–15.5)
WBC: 11.5 10*3/uL — ABNORMAL HIGH (ref 4.0–10.5)
nRBC: 0 % (ref 0.0–0.2)

## 2023-03-14 LAB — GLUCOSE, CAPILLARY
Glucose-Capillary: 110 mg/dL — ABNORMAL HIGH (ref 70–99)
Glucose-Capillary: 80 mg/dL (ref 70–99)
Glucose-Capillary: 134 mg/dL — ABNORMAL HIGH (ref 70–99)

## 2023-03-14 MED ORDER — CEFAZOLIN SODIUM-DEXTROSE 2-4 GM/100ML-% IV SOLN
2.0000 g | Freq: Three times a day (TID) | INTRAVENOUS | Status: DC
  Administered 2023-03-14 – 2023-03-20 (×19): 2 g via INTRAVENOUS
  Filled 2023-03-14 (×19): qty 100

## 2023-03-14 MED ORDER — MILRINONE LACTATE IN DEXTROSE 20-5 MG/100ML-% IV SOLN: 0.3750 ug/kg/min | INTRAVENOUS | Status: DC

## 2023-03-14 MED ORDER — MILRINONE LACTATE IN DEXTROSE 20-5 MG/100ML-% IV SOLN
0.2500 ug/kg/min | INTRAVENOUS | Status: DC
  Administered 2023-03-14 – 2023-03-15 (×4): 0.375 ug/kg/min via INTRAVENOUS
  Filled 2023-03-14 (×3): qty 100

## 2023-03-14 MED ORDER — FUROSEMIDE 10 MG/ML IJ SOLN
60.0000 mg | Freq: Two times a day (BID) | INTRAMUSCULAR | Status: AC
  Administered 2023-03-14 (×2): 60 mg via INTRAVENOUS
  Filled 2023-03-14 (×2): qty 6

## 2023-03-14 MED ORDER — LOSARTAN POTASSIUM 25 MG PO TABS
12.5000 mg | ORAL_TABLET | Freq: Every day | ORAL | Status: DC
  Administered 2023-03-14: 12.5 mg via ORAL
  Filled 2023-03-14: qty 1

## 2023-03-14 NOTE — Progress Notes (Signed)
 PHARMACY ANTIBIOTIC CONSULT NOTE   Walter Vargas a 78 y.o. male admitted on 02/14/23 now s/p CABG 12/19 with re-exploration + Impella 5.5 (12/19-12/30).  Now with RLE cellulitis from vein harvest.  Pharmacy has been consulted for Ancef  dosing.  S/p Zosyn  x  Scr 0.80 (03/14/2023), LA 2.3 (03/03/2023), WBC 11.5 (03/14/2023)  03/14/23: Scr 0.80, WBC 11.5 Vital Signs: Afebrile, HR 79, BP 121/68  Estimated Creatinine Clearance: 68.4 mL/min (by C-G formula based on SCr of 0.8 mg/dL).  Plan: START Ancef  2gm IV Q8h Monitor renal function, clinical status, DOT  Allergies:  No Active Allergies  Filed Weights   03/12/23 0430 03/13/23 0530 03/14/23 0500  Weight: 54 kg (119 lb 0.8 oz) 54.5 kg (120 lb 2.4 oz) 62.5 kg (137 lb 12.6 oz)       Latest Ref Rng & Units 03/14/2023    4:29 AM 03/13/2023    4:42 AM 03/12/2023    1:10 PM  CBC  WBC 4.0 - 10.5 K/uL 11.5  13.2    Hemoglobin 13.0 - 17.0 g/dL 8.3  8.4  9.2   Hematocrit 39.0 - 52.0 % 26.1  26.2  27.0   Platelets 150 - 400 K/uL 455  387      Antibiotics Given (last 72 hours)     Date/Time Action Medication Dose Rate   03/11/23 1728 New Bag/Given   piperacillin -tazobactam (ZOSYN ) IVPB 3.375 g 3.375 g 12.5 mL/hr   03/12/23 0107 New Bag/Given   piperacillin -tazobactam (ZOSYN ) IVPB 3.375 g 3.375 g 12.5 mL/hr       Antimicrobials this admission: Ancef  (periop) 12/19 > 12/21, 1/1 >> c Vanc (periop) 12/19, 12/30 Zosyn  12/25 > 12/30  Microbiology results: 12/7 COVID: positive  Thank you for allowing pharmacy to be a part of this patient's care.  Maurilio Fila, PharmD Clinical Pharmacist 03/14/2023  10:00 AM

## 2023-03-14 NOTE — Plan of Care (Signed)
  Problem: Activity: Goal: Risk for activity intolerance will decrease Outcome: Progressing   Problem: Coping: Goal: Level of anxiety will decrease Outcome: Progressing   Problem: Elimination: Goal: Will not experience complications related to bowel motility Outcome: Not Progressing   Problem: Skin Integrity: Goal: Risk for impaired skin integrity will decrease Outcome: Not Progressing   Problem: Education: Goal: Knowledge of General Education information will improve Description: Including pain rating scale, medication(s)/side effects and non-pharmacologic comfort measures Outcome: Not Progressing   Problem: Nutrition: Goal: Adequate nutrition will be maintained Outcome: Not Progressing

## 2023-03-14 NOTE — Evaluation (Signed)
 Modified Barium Swallow Study  Patient Details  Name: Walter Vargas MRN: [id number] Date of Birth: 1945-04-25  Today's Date: 03/14/2023  Modified Barium Swallow completed.  Full report located under Chart Review in the Imaging Section.  History of Present Illness 78 year old male with chronic HFrEF was initially admitted 11/30 with acute NSTEMI and acute on chronic biventricular HFrEF with moderate to severe MR, underwent CABG x  5 12/19, post-procedure complicated with cardiac stunning and failure to wean from bypass s/p with placement of Impella 5.5. 12/20 back to OR for re-exploration d/t post-op bleeding/ABLA.  Intubated 12/19-20. Cortrak 12/20. Reintubated for procedure only 12/30. Per RN family reports baseline dysphagia   Clinical Impression Pt has an oropharyngeal dysphagia with study somewhat limited by mentation as well as positioning and frequent movement throughout the study that at times limited view. His lingual motion can be repetitive and he does not engage in self-feeding as he has at times with SLP this admission. He loses liquids anteriorly when drinking via spoon or cup and he needs cues to continue mastication and initiate posterior transit with solids. He has reduced hyolaryngeal movement, laryngeal vestibule closure, pharyngeal squeeze, and base of tongue retraction. He also has minimal epiglottic inversion, with no movement across most boluses, which resulted in incomplete clearance of valleculae. He achieved some inversion with honey thick liquids and purees which provided more weight, exhibiting the least amount of residue with these consistencies as a result. He had an increase in residue again when boluses became more solid than puree. Mistiming and presence of oral and pharyngeal residue further contribute to penetration to the vocal folds with thin and nectar thick liquids. Although aspiration was not definitively observed, he also could not clear the penetrates even with a  cued cough. Given current presentation and review of chart, pt appears to be fluctuating in his performance but there has been concern for clinical signs of difficulty with thin and nectar thick liquids as well as potentially even baseline dysphagia. Also considering his current deconditioning and acute medical issues, will err on the side of a more conservative recommendation today (dys 1 diet, honey thick liquids) and plan to follow up clinically to see if he can advance.  Factors that may increase risk of adverse event in presence of aspiration [name] & [name] 2021): Poor general health and/or compromised immunity;Reduced cognitive function;Frail or deconditioned;Dependence for feeding and/or oral hygiene;Weak cough;Presence of tubes (ETT, trach, NG, etc.)  Swallow Evaluation Recommendations Recommendations: PO diet PO Diet Recommendation: Dysphagia 1 (Pureed);Moderately thick liquids (Level 3, honey thick) Liquid Administration via: Cup;Straw Medication Administration: Crushed with puree Supervision: Staff to assist with self-feeding;Full assist for feeding Swallowing strategies  : Minimize environmental distractions;Slow rate;Small bites/sips Postural changes: Position pt fully upright for meals Oral care recommendations: Oral care BID (2x/day) Caregiver Recommendations: Avoid jello, ice cream, thin soups, popsicles;Remove water pitcher;Have oral suction available       Leita SAILOR., M.A. CCC-SLP Acute Rehabilitation Services Office [phone]  Secure chat preferred  03/14/2023,2:25 PM

## 2023-03-14 NOTE — Progress Notes (Signed)
 Patient ID: Walter Vargas, male   DOB: 1945-08-11, 78 y.o.   MRN: [id number]     Advanced Heart Failure Rounding Note  PCP-Cardiologist: None  Chief Complaint: Cardiogenic Shock  Subjective:   12/19: S/p CABG and Impella 5.5 Insertion. Coagulopathic on return from OR, high chest tube output, required multiple blood products. Subsequent VT associated w/ Impella suction alarms. Back to OR for re-exploratation, bleeding from EPW.  12/23: Bedside echo severe biventricular dysfunction. LVEF 25% RV moderately down. Impella well positioned 12/25: started on Zosyn  for fevers/suspected aspiration PNA. PCT 0.34  12/30: Impella 5.5 removed.   POD# 13  Remains on milrinone  0.25, off NE. MAP stable.  Co-ox lower at 52%, CVP 8-9. He looks more tachypneic this morning.  Weight probably not accurate.    Ancef  added for possible cellulitis at vein harvest site.   More confused today.   Meds:   amiodarone   200 mg Per Tube BID   aspirin   81 mg Per Tube Daily   atorvastatin   80 mg Per Tube Daily   Chlorhexidine  Gluconate Cloth  6 each Topical Daily   digoxin   0.125 mg Per Tube Daily   docusate  200 mg Per Tube Daily   enoxaparin  (LOVENOX ) injection  40 mg Subcutaneous Q24H   feeding supplement  237 mL Oral TID BM   feeding supplement (PROSource TF20)  60 mL Per Tube Daily   feeding supplement (VITAL 1.5 CAL)  660 mL Per Tube Q24H   furosemide   60 mg Intravenous BID   Gerhardt's butt cream   Topical Daily   guaiFENesin   15 mL Per Tube Q6H   insulin  aspart  0-9 Units Subcutaneous TID WC   losartan   12.5 mg Oral Daily   mouth rinse  15 mL Mouth Rinse QID   pantoprazole  (PROTONIX ) IV  40 mg Intravenous QHS   QUEtiapine   25 mg Per Tube BID   sodium chloride  flush  10-40 mL Intracatheter Q12H   spironolactone   12.5 mg Oral Daily   thiamine   100 mg Oral Daily     ceFAZolin  (ANCEF ) IV     milrinone  0.25 mcg/kg/min (03/14/23 0800)   norepinephrine  (LEVOPHED ) Adult infusion Stopped (03/13/23 1146)      Objective:   Weight Range: 62.5 kg Body mass index is 20.95 kg/m.   Vital Signs:   Temp:  [98.3 F (36.8 C)-99.5 F (37.5 C)] 98.3 F (36.8 C) (01/01 0800) Pulse Rate:  [74-94] 86 (01/01 1030) Resp:  [16-37] 23 (01/01 0800) BP: (96-123)/(51-68) 121/68 (01/01 0800) SpO2:  [76 %-98 %] 98 % (01/01 0800) Arterial Line BP: (80-160)/(38-78) 140/57 (01/01 0800) Weight:  [62.5 kg] 62.5 kg (01/01 0500) Last BM Date : 03/13/23  Weight change: Filed Weights   03/12/23 0430 03/13/23 0530 03/14/23 0500  Weight: 54 kg 54.5 kg 62.5 kg   Intake/Output:   Intake/Output Summary (Last 24 hours) at 03/14/2023 1033 Last data filed at 03/14/2023 0800 Gross per 24 hour  Intake 1281.56 ml  Output 1450 ml  Net -168.44 ml    Physical Exam  CVP 8-9 General: Tachypneic.  Neck: JVP 10-12 cm, no thyromegaly or thyroid nodule.  Lungs: Clear to auscultation bilaterally with normal respiratory effort. CV: Nondisplaced PMI.  Heart regular S1/S2, no S3/S4, no murmur.  No peripheral edema.    Abdomen: Soft, nontender, no hepatosplenomegaly, no distention.  Skin: Intact without lesions or rashes.  Neurologic: Confused  Psych: Normal affect. Extremities: No clubbing or cyanosis.  HEENT: Normal.    Telemetry  NSR 80s (Personally reviewed)    Patient Profile   Walter Vargas is a 78 y.o. history of HTN, Walter, HFrEF, and severe 3V CAD. S/p CABG and Impella 5.5 Insertion 12/19 w Dr. Kerrin.   Assessment/Plan   1.  Cardiogenic Shock:  Patient has had HF symptoms for at least a couple of months.  Suspect pre-existing ischemic cardiomyopathy then he presented this admission with NSTEMI possible in D2 territory leading to CHF exacerbation. RHC 12/3 showed normal filling pressures and low CI at 2.0. Pre CABG echo EF < 20%, RV moderately reduced (PAPi was adequate on RHC)Intra-op course complicated by cardiac stun and failure to wean from bypass. Impella 5.5 placed. Intra-op TEE EF 20%, RV mildly  reduced.  POCUS 12/25 EF 25-30% RV improved. Impella removed 12/30. NE titrated off.  He remains on  milrinone  0.25. Co-ox lower at 52% this morning. CVP 8-9 but more tachypneic today. BP stable.  - Continue milrinone  0.25 mcg/kg/min today.  Will be slow wean.  Repeat co-ox.   - Lasix  60 mg IV bid today.  - Continue digoxin  0.125.  - Continue spironolactone  12.5 daily.  - Add losartan  12.5 daily.  2. CAD: Patient admitted with NSTEMI (?culprit D2) but with severe extensive diffuse 3 vessel disease.  Disease too extensive to treat percutaneously.  Anatomy felt best served by CABG.  s/p CABG and Impella 5.5 insertion w Dr. Kerrin 12/19. - Continue ASA 81  - Continue atorva 80 3. Post-Operative Bleeding/ ABLA: Coagulopathic on return from OR. Supported with multiple units of blood products including RBCs, cryo, FFP and Factor VII + albumin  and protamine , had to return to the OR. Bleeding noted around chest tubes.  Now stable. CTs pulled.  Hgb 8.3.  4. 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  Patient has had on and off confusion in hospital. Currently confused.  - Continue Seroquel  bid.  5. ID:  Suspected Aspiration PNA. Zosyn  course completed.  Now with possible cellulitis at vein harvest site, started on Ancef .  6. COVID+: Completed Paxlovid  7. Carotid artery stenosis: Pre-CABG dopplers with 80-99% left ICA stenosis. Seen by [facility] - Planning for outpatient follow-up in 1 month (post CABG) with CTA head and neck. 8. DVT prophylaxis: Enoxaparin .  Right lower leg asymmetric edema => venous dopplers negative for DVT.  9. FEN: nocturnal TFs.  Not eating much, does not like dysphagia diet.  - Speech following 10. H/o VT: Peri-CABG.  - Continue amiodarone  200 mg bid.  11. Hyponatremia: Had tolvaptan  12/29, Na 132.  12. Need to mobilize.   Ezra Shuck, MD  10:33 AM

## 2023-03-14 NOTE — Progress Notes (Signed)
 Patient ID: Walter Vargas, male   DOB: 11-23-1945, 78 y.o.   MRN: [id number]  TCTS Evening Rounds:  Hemodynamically stable today but repeat Co-ox at 11 am was 33.5, down from 52.3 at 7:30 am.   Milrinone  increased to 0.375 and NE started at 1 and increased to 5 by DM.  CVP has been low. Diuresing well.

## 2023-03-14 NOTE — Progress Notes (Signed)
 2 Days Post-Op Procedure(s) (LRB): REMOVAL OF IMPELLA LEFT VENTRICULAR ASSIST DEVICE (N/A) TRANSESOPHAGEAL ECHOCARDIOGRAM (TEE) (N/A) Subjective:  No specific complaints. Wants better diet and to get out of the ICU.  No pain.  Objective: Vital signs in last 24 hours: Temp:  [98.3 F (36.8 C)-99.5 F (37.5 C)] 98.3 F (36.8 C) (01/01 0800) Pulse Rate:  [74-94] 79 (01/01 0800) Cardiac Rhythm: Normal sinus rhythm (01/01 0000) Resp:  [16-37] 23 (01/01 0800) BP: (96-123)/(51-68) 121/68 (01/01 0800) SpO2:  [76 %-98 %] 98 % (01/01 0800) Arterial Line BP: (80-160)/(38-78) 140/57 (01/01 0800) Weight:  [62.5 kg] 62.5 kg (01/01 0500)  Hemodynamic parameters for last 24 hours: PAP: (32-49)/(24-39) 36/27 CVP:  [7 mmHg-72 mmHg] 10 mmHg CO:  [4.6 L/min] 4.6 L/min CI:  [2.8 L/min/m2] 2.8 L/min/m2  Intake/Output from previous day: 12/31 0701 - 01/01 0700 In: 1703.7 [P.O.:600; I.V.:131.6; NG/GT:862.3; IV Piggyback:109.8] Out: 1650 [Urine:1650] Intake/Output this shift: Total I/O In: 4.1 [I.V.:4.1] Out: -   General appearance: alert and cooperative Neurologic: intact Heart: regular rate and rhythm Lungs: diminished breath sounds bibasilar Abdomen: soft, non-tender; bowel sounds normal, umbilical hernia.  Extremities: extremities normal, atraumatic, no cyanosis or edema Wound: chest incision healing well. Sternum stable. There is cellulitis of right lower leg from vein harvest tunnel.  Lab Results: Recent Labs    03/13/23 0442 03/14/23 0429  WBC 13.2* 11.5*  HGB 8.4* 8.3*  HCT 26.2* 26.1*  PLT 387 455*   BMET:  Recent Labs    03/13/23 0442 03/14/23 0429  NA 136 132*  K 3.9 4.5  CL 105 102  CO2 25 21*  GLUCOSE 148* 137*  BUN 26* 35*  CREATININE 0.75 0.80  CALCIUM  8.2* 8.3*    PT/INR: No results for input(s): LABPROT, INR in the last 72 hours. ABG    Component Value Date/Time   PHART 7.498 (H) 03/12/2023 1310   HCO3 24.4 03/12/2023 1310   TCO2 25 03/12/2023  1310   ACIDBASEDEF 1.0 03/02/2023 1552   O2SAT 52.3 03/14/2023 0739   CBG (last 3)  Recent Labs    03/13/23 1609 03/13/23 2117 03/14/23 0638  GLUCAP 101* 123* 110*   CXR: small right pleural effusion and bibasilar atelectasis.  Assessment/Plan: S/P Procedure(s) (LRB): REMOVAL OF IMPELLA LEFT VENTRICULAR ASSIST DEVICE (N/A) TRANSESOPHAGEAL ECHOCARDIOGRAM (TEE) (N/A)  Hemodynamically stable on milrinone  0.25. Co-ox dropped this am to 52% from 71.4 yesterday am. AHF team will decide about repeating and when to wean milrinone  further.   Rhythm is sinus 80's. I personally removed the pacing wires this am without difficulty.  DC sleeve today if stable after pacing wire removal for a couple hours.  RLE cellulitis from vein harvest. Will start Ancef .  Further swallowing eval per ST planned for today.  Continuing TF at night for now.  IS, OOB.   LOS: 28 days    Walter Vargas 03/14/2023

## 2023-03-15 ENCOUNTER — Ambulatory Visit: Payer: 59 | Attending: Cardiology | Admitting: Cardiology

## 2023-03-15 LAB — CBC
HCT: 28 % — ABNORMAL LOW (ref 39.0–52.0)
Hemoglobin: 9 g/dL — ABNORMAL LOW (ref 13.0–17.0)
MCH: 28.6 pg (ref 26.0–34.0)
MCHC: 32.1 g/dL (ref 30.0–36.0)
MCV: 88.9 fL (ref 80.0–100.0)
Platelets: 683 10*3/uL — ABNORMAL HIGH (ref 150–400)
RBC: 3.15 MIL/uL — ABNORMAL LOW (ref 4.22–5.81)
RDW: 17 % — ABNORMAL HIGH (ref 11.5–15.5)
WBC: 12.7 10*3/uL — ABNORMAL HIGH (ref 4.0–10.5)
nRBC: 0.2 % (ref 0.0–0.2)

## 2023-03-15 LAB — RENAL FUNCTION PANEL
Albumin: 2.3 g/dL — ABNORMAL LOW (ref 3.5–5.0)
Anion gap: 10 (ref 5–15)
BUN: 40 mg/dL — ABNORMAL HIGH (ref 8–23)
CO2: 21 mmol/L — ABNORMAL LOW (ref 22–32)
Calcium: 8.2 mg/dL — ABNORMAL LOW (ref 8.9–10.3)
Chloride: 101 mmol/L (ref 98–111)
Creatinine, Ser: 0.93 mg/dL (ref 0.61–1.24)
GFR, Estimated: >60 mL/min (ref >60)
Glucose, Bld: 151 mg/dL — ABNORMAL HIGH (ref 70–99)
Phosphorus: 3.5 mg/dL (ref 2.5–4.6)
Potassium: 3.9 mmol/L (ref 3.5–5.1)
Sodium: 132 mmol/L — ABNORMAL LOW (ref 135–145)

## 2023-03-15 LAB — GLUCOSE, CAPILLARY
Glucose-Capillary: 144 mg/dL — ABNORMAL HIGH (ref 70–99)
Glucose-Capillary: 122 mg/dL — ABNORMAL HIGH (ref 70–99)
Glucose-Capillary: 100 mg/dL — ABNORMAL HIGH (ref 70–99)
Glucose-Capillary: 95 mg/dL (ref 70–99)

## 2023-03-15 LAB — COOXEMETRY PANEL
Carboxyhemoglobin: 3.1 % — ABNORMAL HIGH (ref 0.5–1.5)
Carboxyhemoglobin: 2.3 % — ABNORMAL HIGH (ref 0.5–1.5)
Carboxyhemoglobin: 2.3 % — ABNORMAL HIGH (ref 0.5–1.5)
Methemoglobin: <0.7 % (ref 0.0–1.5)
Methemoglobin: 0.8 % (ref 0.0–1.5)
Methemoglobin: 1.5 % (ref 0.0–1.5)
O2 Saturation: 85 %
O2 Saturation: 47.4 %
O2 Saturation: 61.3 %
Total hemoglobin: 8.8 g/dL — ABNORMAL LOW (ref 12.0–16.0)
Total hemoglobin: 9.9 g/dL — ABNORMAL LOW (ref 12.0–16.0)
Total hemoglobin: 10.3 g/dL — ABNORMAL LOW (ref 12.0–16.0)

## 2023-03-15 LAB — LACTATE DEHYDROGENASE: LDH: 328 U/L — ABNORMAL HIGH (ref 98–192)

## 2023-03-15 MED ORDER — FUROSEMIDE 40 MG PO TABS
40.0000 mg | ORAL_TABLET | Freq: Every day | ORAL | Status: DC
Start: 2023-03-16 — End: 2023-03-17
  Administered 2023-03-16: 40 mg via ORAL
  Filled 2023-03-15: qty 1

## 2023-03-15 MED ORDER — POTASSIUM CHLORIDE 20 MEQ PO PACK
40.0000 meq | PACK | Freq: Once | ORAL | Status: AC
  Administered 2023-03-15: 40 meq
  Filled 2023-03-15: qty 2

## 2023-03-15 MED FILL — Lidocaine HCl Local Soln Prefilled Syringe 100 MG/5ML (2%): INTRAMUSCULAR | Qty: 5 | Status: AC

## 2023-03-15 MED FILL — Sodium Chloride IV Soln 0.9%: INTRAVENOUS | Qty: 2000 | Status: AC

## 2023-03-15 MED FILL — Heparin Sodium (Porcine) Inj 1000 Unit/ML: INTRAMUSCULAR | Qty: 60 | Status: AC

## 2023-03-15 MED FILL — Mannitol IV Soln 20%: INTRAVENOUS | Qty: 500 | Status: AC

## 2023-03-15 MED FILL — Calcium Chloride Inj 10%: INTRAVENOUS | Qty: 10 | Status: AC

## 2023-03-15 MED FILL — Magnesium Sulfate Inj 50%: INTRAMUSCULAR | Qty: 10 | Status: AC

## 2023-03-15 MED FILL — Sodium Bicarbonate IV Soln 8.4%: INTRAVENOUS | Qty: 50 | Status: AC

## 2023-03-15 MED FILL — Electrolyte-R (PH 7.4) Solution: INTRAVENOUS | Qty: 3000 | Status: AC

## 2023-03-15 MED FILL — Potassium Chloride Inj 2 mEq/ML: INTRAVENOUS | Qty: 20 | Status: AC

## 2023-03-15 NOTE — Progress Notes (Addendum)
 Patient ID: Walter Vargas, male   DOB: 05-Apr-1945, 78 y.o.   MRN: [id number]     Advanced Heart Failure Rounding Note  PCP-Cardiologist: Dr. Cherrie  Chief Complaint: Cardiogenic Shock  Subjective:   12/19: S/p CABG and Impella 5.5 Insertion. Coagulopathic on return from OR, high chest tube output, required multiple blood products. Subsequent VT associated w/ Impella suction alarms. Back to OR for re-exploratation, bleeding from EPW.  12/23: Bedside echo severe biventricular dysfunction. LVEF 25% RV moderately down. Impella well positioned 12/25: started on Zosyn  for fevers/suspected aspiration PNA. PCT 0.34  12/30: Impella 5.5 removed.   POD# 14  ? CO-OX 34% yesterday >>85% this am.  Yesterday milrinone  increased to 0.375 and NE added back, currently at 6 mcg/min.  CVP 5.   Lying in bed. States he feels okay. No dyspnea at rest.    Meds:   amiodarone   200 mg Per Tube BID   aspirin   81 mg Per Tube Daily   atorvastatin   80 mg Per Tube Daily   Chlorhexidine  Gluconate Cloth  6 each Topical Daily   digoxin   0.125 mg Per Tube Daily   docusate  200 mg Per Tube Daily   enoxaparin  (LOVENOX ) injection  40 mg Subcutaneous Q24H   feeding supplement  237 mL Oral TID BM   feeding supplement (PROSource TF20)  60 mL Per Tube Daily   feeding supplement (VITAL 1.5 CAL)  660 mL Per Tube Q24H   Gerhardt's butt cream   Topical Daily   guaiFENesin   15 mL Per Tube Q6H   insulin  aspart  0-9 Units Subcutaneous TID WC   losartan   12.5 mg Oral Daily   mouth rinse  15 mL Mouth Rinse QID   pantoprazole  (PROTONIX ) IV  40 mg Intravenous QHS   potassium chloride   40 mEq Per Tube Once   QUEtiapine   25 mg Per Tube BID   sodium chloride  flush  10-40 mL Intracatheter Q12H   spironolactone   12.5 mg Oral Daily   thiamine   100 mg Oral Daily     ceFAZolin  (ANCEF ) IV Stopped (03/15/23 9380)   milrinone  0.375 mcg/kg/min (03/15/23 0700)   norepinephrine  (LEVOPHED ) Adult infusion 9 mcg/min (03/15/23 0700)      Objective:   Weight Range: 57.5 kg Body mass index is 19.27 kg/m.   Vital Signs:   Temp:  [97.7 F (36.5 C)-98.9 F (37.2 C)] 97.8 F (36.6 C) (01/02 0700) Pulse Rate:  [77-98] 98 (01/02 0700) Resp:  [17-38] 25 (01/02 0700) BP: (88-136)/(44-114) 136/51 (01/02 0650) SpO2:  [87 %-98 %] 95 % (01/02 0700) Arterial Line BP: (80-150)/(34-73) 125/65 (01/02 0700) Weight:  [57.5 kg] 57.5 kg (01/02 0500) Last BM Date : 03/14/23  Weight change: Filed Weights   03/13/23 0530 03/14/23 0500 03/15/23 0500  Weight: 54.5 kg 62.5 kg 57.5 kg   Intake/Output:   Intake/Output Summary (Last 24 hours) at 03/15/2023 0744 Last data filed at 03/15/2023 0700 Gross per 24 hour  Intake 1939.17 ml  Output 1800 ml  Net 139.17 ml    Physical Exam  CVP 5 General:  Thin, elderly male. No distress. HEENT: + [place] Neck: supple. no JVD.  Cor: PMI nondisplaced. Regular rate & rhythm. No rubs, gallops or murmurs. Lungs: clear Abdomen: soft, nontender, nondistended.  Extremities: no cyanosis, clubbing, rash, edema, LUE PICC Neuro: Affect flat. Appears more confused than baseline.     Telemetry   NSR 80s  Patient Profile   [name] is a 78 y.o. history of HTN,  MR, HFrEF, and severe 3V CAD. Admitted with NSTEMI and a/c CHF. S/p CABG and Impella 5.5 Insertion 12/19 w Dr. Kerrin.   Assessment/Plan   1.  Cardiogenic Shock:  Patient has had HF symptoms for at least a couple of months.  Suspect pre-existing ischemic cardiomyopathy then he presented this admission with NSTEMI possible in D2 territory leading to CHF exacerbation. RHC 12/3 showed normal filling pressures and low CI at 2.0. Pre CABG echo EF < 20%, RV moderately reduced (PAPi was adequate on RHC)Intra-op course complicated by cardiac stun and failure to wean from bypass. Impella 5.5 placed. Intra-op TEE EF 20%, RV mildly reduced.  POCUS 12/25 EF 25-30% RV improved. Impella removed 12/30.  - CO-OX down to 33% yesterday. Milrinone   increased to 0.375 and NE restarted. NE up to 10 overnight, down to 6 this am. Target SBP > 90 rather than MAP. Wean NE as able today. ? CO-OX 85% this am, recheck.  - CVP 5. Start po lasix  40 daily tomorrow. - Continue digoxin  0.125.  - Hold losartan  and spiro until off NE.  - Will further titrate GDMT once off pressors. 2. CAD: Patient admitted with NSTEMI (?culprit D2) but with severe extensive diffuse 3 vessel disease.  Disease too extensive to treat percutaneously.  Anatomy felt best served by CABG.  s/p CABG and Impella 5.5 insertion w Dr. Kerrin 12/19. - Continue ASA 81. Add plavix  prior to discharge. - Continue atorva 80 3. Post-Operative Bleeding/ ABLA: Coagulopathic on return from OR. Supported with multiple units of blood products including RBCs, cryo, FFP and Factor VII + albumin  and protamine , had to return to the OR. Bleeding noted around chest tubes.  Now stable. CTs pulled.  Hgb 9.0.  4. 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  Patient has had on and off confusion in hospital. Currently confused.  - Continue Seroquel  bid.  5. ID:  Suspected Aspiration PNA. Zosyn  course completed.  Now with possible cellulitis at vein harvest site, started on Ancef .  6. COVID+: Completed Paxlovid  7. Carotid artery stenosis: Pre-CABG dopplers with 80-99% left ICA stenosis. Seen by [facility] - Planning for outpatient follow-up in 1 month (post CABG) with CTA head and neck. 8. DVT prophylaxis: Enoxaparin .  Right lower leg asymmetric edema => venous dopplers negative for DVT.  9. FEN: nocturnal TFs.  Not eating much, does not like dysphagia diet.  - Speech following 10. H/o VT: Peri-CABG.  - Continue amiodarone  200 mg bid.  11. Hyponatremia: Had tolvaptan  12/29, Na stable 132.  12. Need to mobilize. PT/OT following. Will need SNF for rehab.  FINCH, LINDSAY N, PA-C  7:44 AM  Patient see with PA, agree with the above note .  He was put back on norepinephrine  last night due to low BP.  I  only see 1 BP recorded with SBP 89.  Actively weaning back off NE.  CVP 5 today, given IV Lasix  yesterday.  We have stopped losartan  and spironolactone . Co-ox 61% on NE 6 + milrinone  0.375.   He is alert/oriented for me, walked down hall earlier.   General: NAD Neck: No JVD, no thyromegaly or thyroid nodule.  Lungs: Clear to auscultation bilaterally with normal respiratory effort. CV: Nondisplaced PMI.  Heart regular S1/S2, no S3/S4, no murmur.  1+ edema right ankle.    Abdomen: Soft, nontender, no hepatosplenomegaly, no distention.  Skin: Intact without lesions or rashes.  Neurologic: Alert and oriented x 3.  Psych: Normal affect. Extremities: No clubbing or cyanosis.  HEENT: Normal.  I will work on weaning off NE today, keep SBP > 90.  Can use midodrine if needed.  Will continue milrinone  at 0.375, suspect this will be a very slow wean.  Continue digoxin .   No diuretic today, will transition to Lasix  40 mg po daily tomorrow.   Delirium improved currently.  He waxes/wanes, worse overnight.   Continue to mobilize.   CRITICAL CARE Performed by: Ezra Shuck  Total critical care time: 35 minutes  Critical care time was exclusive of separately billable procedures and treating other patients.  Critical care was necessary to treat or prevent imminent or life-threatening deterioration.  Critical care was time spent personally by me on the following activities: development of treatment plan with patient and/or surrogate as well as nursing, discussions with consultants, evaluation of patient's response to treatment, examination of patient, obtaining history from patient or surrogate, ordering and performing treatments and interventions, ordering and review of laboratory studies, ordering and review of radiographic studies, pulse oximetry and re-evaluation of patient's condition.  Ezra Shuck 03/15/2023 11:45 AM

## 2023-03-15 NOTE — Progress Notes (Signed)
 Inpatient Rehab Admissions Coordinator:   Continue to follow.  Milrinone d/c'd, NE increased.  Participating well with therapy.    Estill Dooms, PT, DPT Admissions Coordinator [phone] 03/15/23  10:30 AM

## 2023-03-15 NOTE — Plan of Care (Signed)
  Problem: Health Behavior/Discharge Planning: Goal: Ability to manage health-related needs will improve Outcome: Progressing   Problem: Pain Management: Goal: General experience of comfort will improve Outcome: Progressing   Problem: Clinical Measurements: Goal: Diagnostic test results will improve Outcome: Progressing   Problem: Elimination: Goal: Will not experience complications related to bowel motility Outcome: Progressing

## 2023-03-15 NOTE — Consult Note (Addendum)
 WOC Nurse Consult Note: Consult requested for Deep tissue pressure injury to sacrum.  Performed remotely after review of progress notes and photo in the EMR.  WOC consult was previously performed 12/24 for an arm wound.  Pt is critically ill with multiple systemic factors which can impair healing. He is on a low airloss mattress to decrease pressure.  Dark red purple Deep tissue pressure injury noted by the bedside nurse on 1/2; 3.5X3cm, according to the bedside nurse's wound flow sheet. These are high risk to evolve into full thickness tissue loss within 7-10 days.  Pressure Injury POA: No Dressing procedure/placement/frequency: Topical treatment orders provided for bedside nurses to perform as follows: Apply xeroform to sacrum and right arm Q day, cover with foam dressing.  Change foam dressing Q 3 days or PRN soiling. WOC team will assess weekly to determine if a change in the plan of care is indicated at that time. Stephane Fought MSN, RN, CWOCN, Walter Vargas, CNS [phone]

## 2023-03-15 NOTE — Progress Notes (Signed)
 Physical Therapy Treatment Patient Details Name: Walter Vargas MRN: [id number] DOB: [birth year] Today's Date: 03/15/2023   History of Present Illness 78 y.o. male admitted 02/13/22 with NSTEMI, CHF exacerbation. LHC with 3-vessel CAD. 12/9 Pt with fall in room. CABG delayed due to covid +. 12/19 S/p CABG x 5, TEE, and placement of impella and swan-ganz. Coagulopathic on return from OR, high chest tube output, required multiple blood products. 12/20 Back to OR for re-exploratation, bleeding from EPW. ETT 12/19 - 12/20. Chest tubes 12/19 - 12/21. Cortrak placed 12/20. 12/30 impella removed, swan placed. 12/31 swan out.  PMHx: HTN, HF, mod/severe MR.    PT Comments  Pt pleasantly confused, able to progress gait distance with decreased posterior lean today but continues to maintain retropulsion, left lean and lack of awareness for deficits. Pt educated throughout session for sternal precautions with limited carryover end of session. Will continue to follow. Patient will benefit from intensive inpatient follow up therapy, >3 hours/day  HR 86-92 Pre gait 118/72 (86) Post gait 126/65 (82) 97% on RA    If plan is discharge home, recommend the following: Assistance with cooking/housework;Assist for transportation;Supervision due to cognitive status;A lot of help with walking and/or transfers;Help with stairs or ramp for entrance;A lot of help with bathing/dressing/bathroom   Can travel by private vehicle        Equipment Recommendations  BSC/3in1    Recommendations for Other Services       Precautions / Restrictions Precautions Precautions: Fall;Sternal     Mobility  Bed Mobility Overal bed mobility: Needs Assistance Bed Mobility: Rolling, Sidelying to Sit Rolling: Min assist Sidelying to sit: Min assist       General bed mobility comments: cue for sequence/ precautions, assist to lift trunk from surface    Transfers Overall transfer level: Needs assistance   Transfers: Sit to/from  Stand Sit to Stand: Mod assist           General transfer comment: mod assist to rise from bed, mod +2 to rise from recliner, total of 5 trials STS with max cues for hand placement, maintained retropulsion with narrow BOS and pt unable to carryover education    Ambulation/Gait Ambulation/Gait assistance: Mod assist, +2 safety/equipment Gait Distance (Feet): 200 Feet Assistive device: Rolling walker (2 wheels) Gait Pattern/deviations: Step-through pattern, Decreased stride length, Leaning posteriorly, Narrow base of support, Trunk flexed   Gait velocity interpretation: <1.8 ft/sec, indicate of risk for recurrent falls   General Gait Details: pt with flexed trunk, posterior bias, veering right  with left trunk lean and min-mod physical assist to maintain anterior progression and momentum. no standing rest this trial. cues for direction and +2 assist for lines/safety   Stairs             Wheelchair Mobility     Tilt Bed    Modified Rankin (Stroke Patients Only)       Balance Overall balance assessment: Needs assistance Sitting-balance support: No upper extremity supported, Feet supported Sitting balance-Leahy Scale: Fair Sitting balance - Comments: able to sit EOB and recliner   Standing balance support: Bilateral upper extremity supported, Reliant on assistive device for balance Standing balance-Leahy Scale: Poor Standing balance comment: mod assist for standing balance with reliance on RW                            Cognition Arousal: Alert Behavior During Therapy: Flat affect Overall Cognitive Status: Impaired/Different from baseline Area of  Impairment: Attention, Memory, Safety/judgement, Following commands, Awareness, Problem solving, Orientation                 Orientation Level: Disoriented to, Time, Situation Current Attention Level: Sustained Memory: Decreased short-term memory, Decreased recall of precautions Following Commands:  Follows one step commands with increased time, Follows one step commands inconsistently Safety/Judgement: Decreased awareness of safety, Decreased awareness of deficits Awareness: Intellectual Problem Solving: Slow processing, Decreased initiation, Difficulty sequencing, Requires verbal cues, Requires tactile cues General Comments: no recall of sternal precautions, perseverating on calling [name], no awareness of deficits        Exercises      General Comments        Pertinent Vitals/Pain Pain Assessment Pain Assessment: No/denies pain    Home Living                          Prior Function            PT Goals (current goals can now be found in the care plan section) Progress towards PT goals: Progressing toward goals    Frequency    Min 1X/week      PT Plan      Co-evaluation              AM-PAC PT 6 Clicks Mobility   Outcome Measure  Help needed turning from your back to your side while in a flat bed without using bedrails?: A Little Help needed moving from lying on your back to sitting on the side of a flat bed without using bedrails?: A Lot Help needed moving to and from a bed to a chair (including a wheelchair)?: A Lot Help needed standing up from a chair using your arms (e.g., wheelchair or bedside chair)?: A Lot Help needed to walk in hospital room?: Total Help needed climbing 3-5 steps with a railing? : Total 6 Click Score: 11    End of Session Equipment Utilized During Treatment: Gait belt Activity Tolerance: Patient tolerated treatment well Patient left: in chair;with call bell/phone within reach;with chair alarm set;with nursing/sitter in room Nurse Communication: Mobility status;Precautions PT Visit Diagnosis: Other abnormalities of gait and mobility (R26.89);Unsteadiness on feet (R26.81);Muscle weakness (generalized) (M62.81);Difficulty in walking, not elsewhere classified (R26.2)     Time: 9174-9146 PT Time Calculation  (min) (ACUTE ONLY): 28 min  Charges:    $Gait Training: 8-22 mins $Therapeutic Activity: 8-22 mins PT General Charges $$ ACUTE PT VISIT: 1 Visit                     Walter Vargas, PT Acute Rehabilitation Services Office: [phone]    Walter Vargas Walter Vargas 03/15/2023, 9:24 AM

## 2023-03-15 NOTE — Plan of Care (Signed)
  Problem: Coping: Goal: Level of anxiety will decrease Outcome: Progressing   Problem: Elimination: Goal: Will not experience complications related to urinary retention Outcome: Progressing   

## 2023-03-15 NOTE — Progress Notes (Signed)
 Speech Language Pathology Treatment: Dysphagia  Patient Details Name: PACER DORN MRN: [id number] DOB: 02-26-1946 Today's Date: 03/15/2023 Time: 0921-0940 SLP Time Calculation (min) (ACUTE ONLY): 19 min  Assessment / Plan / Recommendation Clinical Impression  F/u after yesterday's MBS. Mr. Amero was in the recliner after his session with PT. Friend was at the bedside; she brought his favorite coffee.  Thickened it to honey consistency; pt able to drink independently after initial assist.  Naturally taking small sips; tolerated without overt s/s of airway intrusion.  Pleasantly confused; able to follow commands but poor carryover of verbal information.    MBS yesterday revealed impairments in both efficiency (a lot of residue remained in the throat after swallowing) and safety (he had difficulty closing his larynx when swallowing) - all exacerbated by his mental status.  He will benefit from therapeutic exercise to target the pharyngeal musculature.    Precautions posted HOB. SLP will follow.    HPI HPI: 78 year old male with chronic HFrEF was initially admitted 11/30 with acute NSTEMI and acute on chronic biventricular HFrEF with moderate to severe MR, underwent CABG x  5 12/19, post-procedure complicated with cardiac stunning and failure to wean from bypass s/p with placement of Impella 5.5. 12/20 back to OR for re-exploration d/t post-op bleeding/ABLA.  Intubated 12/19-20. Cortrak 12/20. Reintubated for procedure only 12/30. Per RN family reports baseline dysphagia. MBS 1/1 with compromised airway protection and efficiency. Dysphagia 1/honey thick liquids recommended.      SLP Plan  Continue with current plan of care      Recommendations for follow up therapy are one component of a multi-disciplinary discharge planning process, led by the attending physician.  Recommendations may be updated based on patient status, additional functional criteria and insurance authorization.     Recommendations  Diet recommendations: Dysphagia 1 (puree);Honey-thick liquid Liquids provided via: Teaspoon;Cup Medication Administration: Crushed with puree Supervision: Staff to assist with self feeding Compensations: Minimize environmental distractions;Slow rate;Small sips/bites Postural Changes and/or Swallow Maneuvers: Seated upright 90 degrees                  Oral care BID   Frequent or constant Supervision/Assistance Dysphagia, oropharyngeal phase (R13.12)     Continue with current plan of care    Margot Oriordan L. Vona, MA CCC/SLP Clinical Specialist - Acute Care SLP Acute Rehabilitation Services Office number [phone]  Vona Palma Laurice  03/15/2023, 9:45 AM

## 2023-03-15 NOTE — Progress Notes (Addendum)
 TCTS DAILY ICU PROGRESS NOTE                   [address]            [place] [address]          [phone]   3 Days Post-Op Procedure(s) (LRB): REMOVAL OF IMPELLA LEFT VENTRICULAR ASSIST DEVICE (N/A) TRANSESOPHAGEAL ECHOCARDIOGRAM (TEE) (N/A)  Total Length of Stay:  LOS: 29 days   Subjective: Patient asking how Geryl is (his son). He is asking for help with opening container to drink.  Objective: Vital signs in last 24 hours: Temp:  [97.7 F (36.5 C)-98.9 F (37.2 C)] 97.8 F (36.6 C) (01/02 0700) Pulse Rate:  [75-98] 98 (01/02 0700) Cardiac Rhythm: Normal sinus rhythm (01/02 0400) Resp:  [17-38] 25 (01/02 0700) BP: (88-136)/(44-114) 136/51 (01/02 0650) SpO2:  [87 %-98 %] 95 % (01/02 0700) Arterial Line BP: (80-150)/(34-73) 125/65 (01/02 0700) Weight:  [57.5 kg] 57.5 kg (01/02 0500)  Filed Weights   03/13/23 0530 03/14/23 0500 03/15/23 0500  Weight: 54.5 kg 62.5 kg 57.5 kg    Weight change: -5 kg   Hemodynamic parameters for last 24 hours: CVP:  [3 mmHg-8 mmHg] 6 mmHg  Intake/Output from previous day: 01/01 0701 - 01/02 0700 In: 1939.2 [I.V.:724.1; NG/GT:915; IV Piggyback:300.1] Out: 1800 [Urine:1800]  Intake/Output this shift: No intake/output data recorded.  Current Meds: Scheduled Meds:  amiodarone   200 mg Per Tube BID   aspirin   81 mg Per Tube Daily   atorvastatin   80 mg Per Tube Daily   Chlorhexidine  Gluconate Cloth  6 each Topical Daily   digoxin   0.125 mg Per Tube Daily   docusate  200 mg Per Tube Daily   enoxaparin  (LOVENOX ) injection  40 mg Subcutaneous Q24H   feeding supplement  237 mL Oral TID BM   feeding supplement (PROSource TF20)  60 mL Per Tube Daily   feeding supplement (VITAL 1.5 CAL)  660 mL Per Tube Q24H   Gerhardt's butt cream   Topical Daily   guaiFENesin   15 mL Per Tube Q6H   insulin  aspart  0-9 Units Subcutaneous TID WC   losartan   12.5 mg Oral Daily   mouth rinse  15 mL Mouth Rinse QID   pantoprazole   (PROTONIX ) IV  40 mg Intravenous QHS   QUEtiapine   25 mg Per Tube BID   sodium chloride  flush  10-40 mL Intracatheter Q12H   spironolactone   12.5 mg Oral Daily   thiamine   100 mg Oral Daily   Continuous Infusions:   ceFAZolin  (ANCEF ) IV Stopped (03/15/23 9380)   milrinone  0.375 mcg/kg/min (03/15/23 0700)   norepinephrine  (LEVOPHED ) Adult infusion 9 mcg/min (03/15/23 0700)   PRN Meds:.sodium chloride , acetaminophen  (TYLENOL ) oral liquid 160 mg/5 mL, diphenhydrAMINE , morphine  injection, ondansetron  (ZOFRAN ) IV, mouth rinse, oxyCODONE , sodium chloride , sodium chloride  flush  General appearance: cooperative and no distress Neurologic: intact Heart: RRR, no murmur Lungs: Diminished at bases R>L Abdomen: Soft, non tender, sporadic bowel sounds this am Extremities: No LE edema. Erythema RLE (cellulitis). Ecchymosis right thigh/leg Wounds:Sternal wound is clean, dry, no sign of infection. 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 [facility])  Lab Results: CBC: Recent Labs    03/14/23 0429 03/15/23 0432  WBC 11.5* 12.7*  HGB 8.3* 9.0*  HCT 26.1* 28.0*  PLT 455* 683*   BMET:  Recent Labs    03/14/23 0429 03/15/23 0432  NA 132* 132*  K 4.5 3.9  CL 102 101  CO2 21* 21*  GLUCOSE 137* 151*  BUN 35* 40*  CREATININE 0.80 0.93  CALCIUM  8.3* 8.2*    CMET: Lab Results  Component Value Date   WBC 12.7 (H) 03/15/2023   HGB 9.0 (L) 03/15/2023   HCT 28.0 (L) 03/15/2023   PLT 683 (H) 03/15/2023   GLUCOSE 151 (H) 03/15/2023   CHOL 90 02/11/2023   TRIG 45 02/11/2023   HDL 38 (L) 02/11/2023   LDLCALC 43 02/11/2023   ALT 11 03/03/2023   AST 39 03/03/2023   NA 132 (L) 03/15/2023   K 3.9 03/15/2023   CL 101 03/15/2023   CREATININE 0.93 03/15/2023   BUN 40 (H) 03/15/2023   CO2 21 (L) 03/15/2023   TSH 4.481 02/11/2023   PSA 0.74 06/21/2022   INR 1.6 (H) 03/03/2023   HGBA1C 5.9 (H) 02/28/2023      PT/INR: No results for input(s): LABPROT, INR in the last 72 hours. Radiology: DG Swallowing  Func-Speech Pathology Result Date: 03/14/2023 Table formatting from the original result was not included. Modified Barium Swallow Study Patient Details Name: Walter Vargas MRN: [id number] Date of Birth: 01/28/1946 Today's Date: 03/14/2023 HPI/PMH: HPI: 78 year old male with chronic HFrEF was initially admitted 11/30 with acute NSTEMI and acute on chronic biventricular HFrEF with moderate to severe MR, underwent CABG x  5 12/19, post-procedure complicated with cardiac stunning and failure to wean from bypass s/p with placement of Impella 5.5. 12/20 back to OR for re-exploration d/t post-op bleeding/ABLA.  Intubated 12/19-20. Cortrak 12/20. Reintubated for procedure only 12/30. Per RN family reports baseline dysphagia Clinical Impression: Pt has an oropharyngeal dysphagia with study somewhat limited by mentation as well as positioning and frequent movement throughout the study that at times limited view. His lingual motion can be repetitive and he does not engage in self-feeding as he has at times with SLP this admission. He loses liquids anteriorly when drinking via spoon or cup and he needs cues to continue mastication and initiate posterior transit with solids. He has reduced hyolaryngeal movement, laryngeal vestibule closure, pharyngeal squeeze, and base of tongue retraction. He also has minimal epiglottic inversion, with no movement across most boluses, which resulted in incomplete clearance of valleculae. He achieved some inversion with honey thick liquids and purees which provided more weight, exhibiting the least amount of residue with these consistencies as a result. He had an increase in residue again when boluses became more solid than puree. Mistiming and presence of oral and pharyngeal residue further contribute to penetration to the vocal folds with thin and nectar thick liquids. Although aspiration was not definitively observed, he also could not clear the penetrates even with a cued cough. Given current  presentation and review of chart, pt appears to be fluctuating in his performance but there has been concern for clinical signs of difficulty with thin and nectar thick liquids as well as potentially even baseline dysphagia. Also considering his current deconditioning and acute medical issues, will err on the side of a more conservative recommendation today (dys 1 diet, honey thick liquids) and plan to follow up clinically to see if he can advance. Factors that may increase risk of adverse event in presence of aspiration [name] & [name] 2021): Factors that may increase risk of adverse event in presence of aspiration [name] & [name] 2021): Poor general health and/or compromised immunity; Reduced cognitive function; Frail or deconditioned; Dependence for feeding and/or oral hygiene; Weak cough; Presence of tubes (ETT, trach, NG, etc.) Recommendations/Plan: Swallowing Evaluation Recommendations Swallowing Evaluation Recommendations Recommendations: PO  diet PO Diet Recommendation: Dysphagia 1 (Pureed); Moderately thick liquids (Level 3, honey thick) Liquid Administration via: Cup; Straw Medication Administration: Crushed with puree Supervision: Staff to assist with self-feeding; Full assist for feeding Swallowing strategies  : Minimize environmental distractions; Slow rate; Small bites/sips Postural changes: Position pt fully upright for meals Oral care recommendations: Oral care BID (2x/day) Caregiver Recommendations: Avoid jello, ice cream, thin soups, popsicles; Remove water pitcher; Have oral suction available Treatment Plan Treatment Plan Treatment recommendations: Therapy as outlined in treatment plan below Follow-up recommendations: Acute inpatient rehab (3 hours/day) Functional status assessment: Patient has had a recent decline in their functional status and demonstrates the ability to make significant improvements in function in a reasonable and predictable amount of time. Treatment frequency: Min 2x/week  Treatment duration: 2 weeks Interventions: Aspiration precaution training; Oropharyngeal exercises; Compensatory techniques; Patient/family education; Trials of upgraded texture/liquids; Diet toleration management by SLP; Respiratory muscle strength training Recommendations Recommendations for follow up therapy are one component of a multi-disciplinary discharge planning process, led by the attending physician.  Recommendations may be updated based on patient status, additional functional criteria and insurance authorization. Assessment: Orofacial Exam: Orofacial Exam Oral Cavity - Dentition: Missing dentition Anatomy: No data recorded Boluses Administered: Boluses Administered Boluses Administered: Thin liquids (Level 0); Mildly thick liquids (Level 2, nectar thick); Moderately thick liquids (Level 3, honey thick); Puree; Solid  Oral Impairment Domain: Oral Impairment Domain Lip Closure: Escape beyond mid-chin Tongue control during bolus hold: Posterior escape of greater than half of bolus (cognitively not following commands) Bolus preparation/mastication: Slow prolonged chewing/mashing with complete recollection Bolus transport/lingual motion: Repetitive/disorganized tongue motion Oral residue: Residue collection on oral structures Location of oral residue : Tongue Initiation of pharyngeal swallow : Valleculae  Pharyngeal Impairment Domain: Pharyngeal Impairment Domain Soft palate elevation: No bolus between soft palate (SP)/pharyngeal wall (PW) Laryngeal elevation: Partial superior movement of thyroid cartilage/partial approximation of arytenoids to epiglottic petiole Anterior hyoid excursion: Partial anterior movement Epiglottic movement: No inversion Laryngeal vestibule closure: Incomplete, narrow column air/contrast in laryngeal vestibule Pharyngeal stripping wave : Present - diminished Pharyngeal contraction (A/P view only): N/A Pharyngoesophageal segment opening: Partial distention/partial duration, partial  obstruction of flow Tongue base retraction: Trace column of contrast or air between tongue base and PPW Pharyngeal residue: Collection of residue within or on pharyngeal structures Location of pharyngeal residue: Valleculae; Pyriform sinuses  Esophageal Impairment Domain: No data recorded Pill: No data recorded Penetration/Aspiration Scale Score: Penetration/Aspiration Scale Score 1.  Material does not enter airway: Moderately thick liquids (Level 3, honey thick); Puree; Solid 5.  Material enters airway, CONTACTS cords and not ejected out: Thin liquids (Level 0); Mildly thick liquids (Level 2, nectar thick) Compensatory Strategies: No data recorded  General Information: Caregiver present: Yes (RN)  Diet Prior to this Study: Dysphagia 1 (pureed); Thin liquids (Level 0); Cortrak/Small bore NG tube   Temperature : Normal   Respiratory Status: WFL   Supplemental O2: None (Room air)   History of Recent Intubation: Yes  Behavior/Cognition: Alert; Cooperative; Confused; Distractible; Requires cueing Self-Feeding Abilities: Dependent for feeding Baseline vocal quality/speech: Hypophonia/low volume Volitional Cough: Unable to elicit No data recorded Exam Limitations: Poor positioning; Excessive movement; Other (comment) (reduced command following) Goal Planning: Prognosis for improved oropharyngeal function: Fair Barriers to Reach Goals: Cognitive deficits No data recorded Patient/Family Stated Goal: none stated Consulted and agree with results and recommendations: Patient; Nurse Pain: Pain Assessment Pain Assessment: Faces Faces Pain Scale: 0 End of Session: Start Time:SLP Start Time (ACUTE ONLY): 1304  Stop Time: SLP Stop Time (ACUTE ONLY): 1320 Time Calculation:SLP Time Calculation (min) (ACUTE ONLY): 16 min Charges: SLP Evaluations $ SLP Speech Visit: 1 Visit SLP Evaluations $MBS Swallow: 1 Procedure $Swallowing Treatment: 1 Procedure SLP visit diagnosis: SLP Visit Diagnosis: Dysphagia, oropharyngeal phase (R13.12) Past  Medical History: Past Medical History: Diagnosis Date  Essential hypertension   HFrEF (heart failure with reduced ejection fraction) (HCC)   Mitral regurgitation  Past Surgical History: Past Surgical History: Procedure Laterality Date  CORONARY ARTERY BYPASS GRAFT N/A 03/01/2023  Procedure: CORONARY ARTERY BYPASS GRAFTING X 5, USING LEFT INTERNAL MAMMARY ARTERY AND ENDOSCOPICALLY HARVESTED RIGHT SAPHENOUS VEIN GRAFT;  Surgeon: Kerrin Elspeth BROCKS, MD;  Location: [facility] OR;  Service: Open Heart Surgery;  Laterality: N/A;  EXPLORATION POST OPERATIVE OPEN HEART N/A 03/01/2023  Procedure: EXPLORATION POST OPERATIVE OPEN HEART;  Surgeon: Kerrin Elspeth BROCKS, MD;  Location: [facility] OR;  Service: Open Heart Surgery;  Laterality: N/A;  NO PAST SURGERIES    PLACEMENT OF IMPELLA LEFT VENTRICULAR ASSIST DEVICE  03/01/2023  Procedure: PLACEMENT OF IMPELLA LEFT VENTRICULAR ASSIST DEVICE;  Surgeon: Kerrin Elspeth BROCKS, MD;  Location: [facility] OR;  Service: Open Heart Surgery;;  REMOVAL OF IMPELLA LEFT VENTRICULAR ASSIST DEVICE N/A 03/12/2023  Procedure: REMOVAL OF IMPELLA LEFT VENTRICULAR ASSIST DEVICE;  Surgeon: Kerrin Elspeth BROCKS, MD;  Location: [facility] OR;  Service: Open Heart Surgery;  Laterality: N/A;  RIGHT/LEFT HEART CATH AND CORONARY ANGIOGRAPHY N/A 02/13/2023  Procedure: RIGHT/LEFT HEART CATH AND CORONARY ANGIOGRAPHY;  Surgeon: Mady Bruckner, MD;  Location: [facility] INVASIVE CV LAB;  Service: Cardiovascular;  Laterality: N/A;  TEE WITHOUT CARDIOVERSION N/A 03/01/2023  Procedure: TRANSESOPHAGEAL ECHOCARDIOGRAM (TEE);  Surgeon: Kerrin Elspeth BROCKS, MD;  Location: [facility] OR;  Service: Open Heart Surgery;  Laterality: N/A;  TEE WITHOUT CARDIOVERSION N/A 03/12/2023  Procedure: TRANSESOPHAGEAL ECHOCARDIOGRAM (TEE);  Surgeon: Kerrin Elspeth BROCKS, MD;  Location: [facility] OR;  Service: Open Heart Surgery;  Laterality: N/A; Leita SAILOR., M.A. CCC-SLP Acute Rehabilitation Services Office [phone] Secure chat preferred 03/14/2023, 2:28  PM   Assessment/Plan: S/P Procedure(s) (LRB): REMOVAL OF IMPELLA LEFT VENTRICULAR ASSIST DEVICE (N/A) TRANSESOPHAGEAL ECHOCARDIOGRAM (TEE) (N/A) CV-S/p NSTEMI. S/p removal of Impella 12/30. SR this am. LVEF prior to surgery < 20%. Co ox this am 85. On Milrinone , which was increased to 0.375 yesterday for decreased co ox. Also, on Nor Epinephrine  drip and Amiodarone  200 mg bid via tube and Digoxin  0.125 via tube. On Amiodarone  for history of VT.   Pulmonary-On room air this am. Will order CXR for am . Encourage incentive spirometer and flutter valve Expected post op blood loss anemia-H and H this am increased to 9 and 28 HFrEF-CVP 6. On Spironolactone  12.5 mg daily. Advanced heart failure following. 5. CBGs 80/134/144. Pre op HGA1C 5.9. 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 Will continue accu checks, SS PRN until tolerating PO 6. Supplement potassium 7. GI-Cortrak in place. Tolerating tube feedings at 60 ml/hr. Also, on dysphagia I diet. Speech pathology following. 8. On Lovenox  for DVT prophylaxis 9. Delirium-He most likely has underlying dementia, continue Seroquel  and Thiamine  10. Cellulitis RLE-on Cefazolin  10. Deconditioned-continue PT  Donielle CHRISTELLA Donald PA-C 03/15/2023 7:20 AM  Patient seen and examined, agree with above Co-ox 85 this AM on milrinone  0.375, norepi 9 New cellulitis right LE- day 2 Ancef  Severe protein calorie malnutrition- Tf + D1 diet Deconditioning- mobilize  [employer]. Kerrin, MD [facility] [phone]

## 2023-03-16 ENCOUNTER — Inpatient Hospital Stay (HOSPITAL_COMMUNITY): Payer: 59

## 2023-03-16 LAB — CBC
HCT: 27.4 % — ABNORMAL LOW (ref 39.0–52.0)
Hemoglobin: 8.5 g/dL — ABNORMAL LOW (ref 13.0–17.0)
MCH: 28.3 pg (ref 26.0–34.0)
MCHC: 31 g/dL (ref 30.0–36.0)
MCV: 91.3 fL (ref 80.0–100.0)
Platelets: 575 10*3/uL — ABNORMAL HIGH (ref 150–400)
RBC: 3 MIL/uL — ABNORMAL LOW (ref 4.22–5.81)
RDW: 17 % — ABNORMAL HIGH (ref 11.5–15.5)
WBC: 9.4 10*3/uL (ref 4.0–10.5)
nRBC: 0 % (ref 0.0–0.2)

## 2023-03-16 LAB — RENAL FUNCTION PANEL
Albumin: 2.1 g/dL — ABNORMAL LOW (ref 3.5–5.0)
Anion gap: 9 (ref 5–15)
BUN: 34 mg/dL — ABNORMAL HIGH (ref 8–23)
CO2: 22 mmol/L (ref 22–32)
Calcium: 8.4 mg/dL — ABNORMAL LOW (ref 8.9–10.3)
Chloride: 100 mmol/L (ref 98–111)
Creatinine, Ser: 0.71 mg/dL (ref 0.61–1.24)
GFR, Estimated: >60 mL/min (ref >60)
Glucose, Bld: 153 mg/dL — ABNORMAL HIGH (ref 70–99)
Phosphorus: 3.5 mg/dL (ref 2.5–4.6)
Potassium: 4.2 mmol/L (ref 3.5–5.1)
Sodium: 131 mmol/L — ABNORMAL LOW (ref 135–145)

## 2023-03-16 LAB — GLUCOSE, CAPILLARY
Glucose-Capillary: 147 mg/dL — ABNORMAL HIGH (ref 70–99)
Glucose-Capillary: 61 mg/dL — ABNORMAL LOW (ref 70–99)
Glucose-Capillary: 73 mg/dL (ref 70–99)
Glucose-Capillary: 109 mg/dL — ABNORMAL HIGH (ref 70–99)
Glucose-Capillary: 122 mg/dL — ABNORMAL HIGH (ref 70–99)

## 2023-03-16 LAB — COOXEMETRY PANEL
Carboxyhemoglobin: 2.5 % — ABNORMAL HIGH (ref 0.5–1.5)
Methemoglobin: <0.7 % (ref 0.0–1.5)
O2 Saturation: 69.4 %
Total hemoglobin: 8.9 g/dL — ABNORMAL LOW (ref 12.0–16.0)

## 2023-03-16 LAB — DIGOXIN LEVEL: Digoxin Level: 0.6 ng/mL — ABNORMAL LOW (ref 0.8–2.0)

## 2023-03-16 MED ORDER — LOSARTAN POTASSIUM 25 MG PO TABS
12.5000 mg | ORAL_TABLET | Freq: Every day | ORAL | Status: DC
  Administered 2023-03-16 – 2023-03-18 (×3): 12.5 mg via ORAL
  Filled 2023-03-16 (×3): qty 1

## 2023-03-16 MED ORDER — ZINC SULFATE 220 (50 ZN) MG PO CAPS
220.0000 mg | ORAL_CAPSULE | Freq: Every day | ORAL | Status: DC
  Administered 2023-03-16 – 2023-03-20 (×5): 220 mg
  Filled 2023-03-16 (×5): qty 1

## 2023-03-16 MED ORDER — ORAL CARE MOUTH RINSE
15.0000 mL | OROMUCOSAL | Status: DC | PRN
Start: 2023-03-16 — End: 2023-03-20

## 2023-03-16 MED ORDER — ORAL CARE MOUTH RINSE
15.0000 mL | OROMUCOSAL | Status: DC
  Administered 2023-03-16 – 2023-03-20 (×17): 15 mL via OROMUCOSAL

## 2023-03-16 MED ORDER — VITAMIN C 500 MG PO TABS: 500.0000 mg | ORAL_TABLET | Freq: Every day | ORAL | Status: DC

## 2023-03-16 MED ORDER — SPIRONOLACTONE 12.5 MG HALF TABLET
12.5000 mg | ORAL_TABLET | Freq: Every day | ORAL | Status: DC
  Administered 2023-03-16 – 2023-03-20 (×5): 12.5 mg via ORAL
  Filled 2023-03-16 (×5): qty 1

## 2023-03-16 MED ORDER — ZINC SULFATE 220 (50 ZN) MG PO CAPS: 220.0000 mg | ORAL_CAPSULE | Freq: Every day | ORAL | Status: DC

## 2023-03-16 MED ORDER — VITAMIN C 500 MG PO TABS
500.0000 mg | ORAL_TABLET | Freq: Every day | ORAL | Status: DC
  Administered 2023-03-16 – 2023-03-20 (×5): 500 mg
  Filled 2023-03-16 (×5): qty 1

## 2023-03-16 NOTE — Progress Notes (Signed)
 Hypoglycemic Event  CBG: 61  Treatment: meal tray  Symptoms: none  Follow-up CBG: Time: 1148 CBG Result:73  Possible Reasons for Event: did not eat breakfast, tube feeds off since this AM  Comments/MD notified: none, will discuss on evening rounds   Walter Vargas

## 2023-03-16 NOTE — Progress Notes (Signed)
 Inpatient Rehab Admissions Coordinator:   Continues to improve.  Off NE, milrinone weaning.  I will start insurance auth request today.   Estill Dooms, PT, DPT Admissions Coordinator [phone] 03/16/23  4:43 PM

## 2023-03-16 NOTE — Progress Notes (Addendum)
 Patient ID: Walter Vargas, male   DOB: May 06, 1945, 78 y.o.   MRN: [id number]     Advanced Heart Failure Rounding Note  PCP-Cardiologist: Dr. Cherrie  Chief Complaint: Cardiogenic Shock  Subjective:   12/19: S/p CABG and Impella 5.5 Insertion. Coagulopathic on return from OR, high chest tube output, required multiple blood products. Subsequent VT associated w/ Impella suction alarms. Back to OR for re-exploratation, bleeding from EPW.  12/23: Bedside echo severe biventricular dysfunction. LVEF 25% RV moderately down. Impella well positioned 12/25: started on Zosyn  for fevers/suspected aspiration PNA. PCT 0.34  12/30: Impella 5.5 removed.   POD# 15  CO-OX 69% on milrinone  0.375. Remains off NE.   CVP 6.   Feeling well. No dyspnea. More confused this am.   Meds:   amiodarone   200 mg Per Tube BID   aspirin   81 mg Per Tube Daily   atorvastatin   80 mg Per Tube Daily   Chlorhexidine  Gluconate Cloth  6 each Topical Daily   digoxin   0.125 mg Per Tube Daily   docusate  200 mg Per Tube Daily   enoxaparin  (LOVENOX ) injection  40 mg Subcutaneous Q24H   feeding supplement  237 mL Oral TID BM   feeding supplement (PROSource TF20)  60 mL Per Tube Daily   feeding supplement (VITAL 1.5 CAL)  660 mL Per Tube Q24H   furosemide   40 mg Oral Daily   Gerhardt's butt cream   Topical Daily   guaiFENesin   15 mL Per Tube Q6H   insulin  aspart  0-9 Units Subcutaneous TID WC   mouth rinse  15 mL Mouth Rinse QID   pantoprazole  (PROTONIX ) IV  40 mg Intravenous QHS   QUEtiapine   25 mg Per Tube BID   sodium chloride  flush  10-40 mL Intracatheter Q12H   thiamine   100 mg Oral Daily     ceFAZolin  (ANCEF ) IV Stopped (03/16/23 9360)   milrinone  0.375 mcg/kg/min (03/16/23 0700)   norepinephrine  (LEVOPHED ) Adult infusion Stopped (03/15/23 1343)     Objective:   Weight Range: 58.3 kg Body mass index is 19.54 kg/m.   Vital Signs:   Temp:  [97.6 F (36.4 C)-98.7 F (37.1 C)] 98.2 F (36.8 C) (01/02  2315) Pulse Rate:  [68-226] 71 (01/03 0700) Resp:  [13-37] 23 (01/03 0700) BP: (93-137)/(47-114) 133/108 (01/03 0700) SpO2:  [72 %-100 %] 97 % (01/03 0700) Arterial Line BP: (85-155)/(33-63) 139/44 (01/03 0630) Weight:  [58.3 kg] 58.3 kg (01/03 0341) Last BM Date : 03/14/23  Weight change: Filed Weights   03/14/23 0500 03/15/23 0500 03/16/23 0341  Weight: 62.5 kg 57.5 kg 58.3 kg   Intake/Output:   Intake/Output Summary (Last 24 hours) at 03/16/2023 0708 Last data filed at 03/16/2023 0700 Gross per 24 hour  Intake 1960.26 ml  Output 1300 ml  Net 660.26 ml    CVP 6 Physical Exam  General:  Thin, elderly male. No distress.  HEENT: + CorTrak Neck: supple. no JVD.  Cor: PMI nondisplaced. Regular rate & rhythm. No rubs, gallops or murmurs. Sternum stable. Lungs: clear Abdomen: soft, nontender, nondistended.  Extremities: no cyanosis, clubbing, rash, edema Neuro: Awake. Appears confused. Affect pleasant   Telemetry  SR 70  Patient Profile   Mr Cotham is a 78 y.o. history of HTN, MR, HFrEF, and severe 3V CAD. Admitted with NSTEMI and a/c CHF. S/p CABG and Impella 5.5 Insertion 12/19 w Dr. Kerrin.   Assessment/Plan   1.  Cardiogenic Shock:  Patient has had HF symptoms for at  least a couple of months.  Suspect pre-existing ischemic cardiomyopathy then he presented this admission with NSTEMI possible in D2 territory leading to CHF exacerbation. RHC 12/3 showed normal filling pressures and low CI at 2.0. Pre CABG echo EF < 20%, RV moderately reduced (PAPi was adequate on RHC)Intra-op course complicated by cardiac stun and failure to wean from bypass. Impella 5.5 placed. Intra-op TEE EF 20%, RV mildly reduced.  POCUS 12/25 EF 25-30% RV improved. Impella removed 12/30.  - CO-OX 69% on milrinone  0.375. Wean milrinone  to 0.25. Remains off NE. - CVP 6. Starting po lasix  40 mg daily - Continue digoxin  0.125. Dig level 0.6. - Restart spiro 12.5 mg and losartan  12.5 mg daily 2. CAD:  Patient admitted with NSTEMI (?culprit D2) but with severe extensive diffuse 3 vessel disease.  Disease too extensive to treat percutaneously.  Anatomy felt best served by CABG.  s/p CABG and Impella 5.5 insertion w Dr. Kerrin 12/19. - Continue ASA 81. Add plavix  prior to discharge. - Continue atorva 80 3. Post-Operative Bleeding/ ABLA: Coagulopathic on return from OR. Supported with multiple units of blood products including RBCs, cryo, FFP and Factor VII + albumin  and protamine , had to return to the OR. Bleeding noted around chest tubes.  Now stable. CTs pulled.  Hgb 8.5.  4. 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  Patient has had on and off confusion in hospital. Worse in evenings/early am.  - Continue Seroquel  bid.  5. ID:  Suspected Aspiration PNA. Zosyn  course completed.  Now with possible cellulitis at vein harvest site, started on Ancef .  6. COVID+: Completed Paxlovid  7. Carotid artery stenosis: Pre-CABG dopplers with 80-99% left ICA stenosis. Seen by [facility] - Planning for outpatient follow-up in 1 month (post CABG) with CTA head and neck. 8. DVT prophylaxis: Enoxaparin .  Right lower leg asymmetric edema => venous dopplers negative for DVT.  9. FEN: nocturnal TFs.  Not eating much, does not like dysphagia diet.  - Speech following 10. H/o VT: Peri-CABG.  - Continue amiodarone  200 mg bid.  11. Hyponatremia: Had tolvaptan  12/29, Na stable 131.  12. Need to mobilize. PT/OT following. Will need SNF for rehab.  [facility], LINDSAY N, PA-C  7:08 AM  Patient seen with PA, agree with the above note.   CVP 6, co-ox 69%. Still on and off confusion. Digoxin  level 0.6.   General: NAD Neck: No JVD, no thyromegaly or thyroid nodule.  Lungs: Clear to auscultation bilaterally with normal respiratory effort. CV: Nondisplaced PMI.  Heart regular S1/S2, no S3/S4, no murmur.  No peripheral edema.  Abdomen: Soft, nontender, no hepatosplenomegaly, no distention.  Skin: Intact without lesions  or rashes.  Neurologic: Alert and oriented x 3.  Psych: Normal affect. Extremities: No clubbing or cyanosis.  HEENT: Normal.   Continue po Lasix .  Can restart losartan  12.5 daily and spironolactone  12.5 daily.   Decrease milrinone  to 0.25 mcg/kg/min.  Recheck co-ox in am and continue down-titration.   Continue to work with PT.   Ezra Shuck 03/16/2023 1:40 PM

## 2023-03-16 NOTE — Progress Notes (Signed)
 4 Days Post-Op Procedure(s) (LRB): REMOVAL OF IMPELLA LEFT VENTRICULAR ASSIST DEVICE (N/A) TRANSESOPHAGEAL ECHOCARDIOGRAM (TEE) (N/A) Subjective: No complaints this AM  Objective: Vital signs in last 24 hours: Temp:  [97.6 F (36.4 C)-98.7 F (37.1 C)] 97.9 F (36.6 C) (01/03 0700) Pulse Rate:  [68-226] 71 (01/03 0700) Cardiac Rhythm: Normal sinus rhythm (01/02 2000) Resp:  [13-37] 23 (01/03 0700) BP: (93-137)/(47-114) 133/108 (01/03 0700) SpO2:  [72 %-100 %] 97 % (01/03 0700) Arterial Line BP: (85-155)/(33-63) 139/44 (01/03 0630) Weight:  [58.3 kg] 58.3 kg (01/03 0341)  Hemodynamic parameters for last 24 hours: CVP:  [0 mmHg-11 mmHg] 3 mmHg  Intake/Output from previous day: 01/02 0701 - 01/03 0700 In: 1960.3 [I.V.:290.3; NG/GT:1370; IV Piggyback:300] Out: 1300 [Urine:1300] Intake/Output this shift: No intake/output data recorded.  General appearance: alert, cooperative, and no distress Neurologic: intact Heart: regular rate and rhythm Lungs: diminished breath sounds bibasilar Sternal wound Ok,  RLE slight decrease in erythema  Lab Results: Recent Labs    03/15/23 0432 03/16/23 0330  WBC 12.7* 9.4  HGB 9.0* 8.5*  HCT 28.0* 27.4*  PLT 683* 575*   BMET:  Recent Labs    03/15/23 0432 03/16/23 0330  NA 132* 131*  K 3.9 4.2  CL 101 100  CO2 21* 22  GLUCOSE 151* 153*  BUN 40* 34*  CREATININE 0.93 0.71  CALCIUM  8.2* 8.4*    PT/INR: No results for input(s): LABPROT, INR in the last 72 hours. ABG    Component Value Date/Time   PHART 7.498 (H) 03/12/2023 1310   HCO3 24.4 03/12/2023 1310   TCO2 25 03/12/2023 1310   ACIDBASEDEF 1.0 03/02/2023 1552   O2SAT 69.4 03/16/2023 0330   CBG (last 3)  Recent Labs    03/15/23 1608 03/15/23 2221 03/16/23 0643  GLUCAP 100* 95 147*    Assessment/Plan: S/P Procedure(s) (LRB): REMOVAL OF IMPELLA LEFT VENTRICULAR ASSIST DEVICE (N/A) TRANSESOPHAGEAL ECHOCARDIOGRAM (TEE) (N/A) - NEURO- delirium waxes and  wanes, alert and appropriate this AM CV- in Sr, BP ok off norepi   Still on milrinone  0,375, co-ox 69, CVP 1-5  On digoxin   ASA  Not on beta blocker  RESP- continue IS, has a small to moderate right effusion RENAL- creatinine normal, BUN down from 40 to 34  To start pO Lasix  today ENDO- CBG well controlled Severe protein calorie malnutrition- nocturnal TF + PO during day Deconditioning- severe, continue PT/OT ID- on Ancef  for cellulitis right LE    LOS: 30 days    Elspeth JAYSON Millers 03/16/2023

## 2023-03-16 NOTE — Progress Notes (Signed)
 Nutrition Follow-up  DOCUMENTATION CODES:   Severe malnutrition in context of chronic illness  INTERVENTION:   Calorie Count to better assess oral intake; further recommendations regarding Cortrak and TF pending results of calorie count  Ensure Enlive po to TID-currently must be thickened to honey consistency, each supplement provides 350 kcal and 20 grams of protein.   Magic cup BID at lunch and dinner, each supplement provides 290 kcal and 9 grams of protein    Tube Feeding via Cortrak:  Vital 1.5 at 55 ml/hr x 12 hours Pro-Source TF20 60 mL TF provides 1070 kcals, 65 g of protein and 502 mL of free water  Add Vitamin C  500 mg daily x 30 days and Zinc  Sulfate 200 mg daily x 14 days for wound healing. Continue Thiamine   NUTRITION DIAGNOSIS:   Severe Malnutrition related to chronic illness (HFrEF, 3V CAD, MR) as evidenced by severe fat depletion, severe muscle depletion.  Being addressed  GOAL:   Patient will meet greater than or equal to 90% of their needs  Progressing  MONITOR:   PO intake, Supplement acceptance, TF tolerance, Skin, Weight trends, Labs  REASON FOR ASSESSMENT:   Ventilator    ASSESSMENT:   Pt remains admitted with heart failure, NSTEMI and cardiogenic shock. PMH significant for HTN, MR, HFrEF and severe 3V CAD.  11/30: admitted 12/19: s/p CABG and impella 5/5 placement 12/20: back to OR for re-exploration d/t post-op bleeding/ABLA; cortrak placed, trickle TF initiated, extubated 12/26 Diet advanced to Dysphagia 1, Thins per SLP 12/27 Changed to nocturnal TF 12/30 OR for Impella removal, Cortrak inadvertently removed during TEE and replaced postop  Mental status waxes and wanes; alert and more appropriate this AM per RN  Hypoglycemic this AM after nocturnal TF stopped and pt did not eat breakfast but did eat about 50% of lunch today. Recorded po intake 0-50% of meals  Noted diet downgraded to Dysphagia 1 with HONEY thick liquids on 1/01. Pt  had been drinking Ensures very well but RN reports pt did not want Ensure this AM. Previously pt able to drink Ensure without being thickened to honey consistency. Pt also receiving the Magic Cup  Weight 58.3 kg, noted lowest wt of 54 kg  Continues on nocturnal TF via Cortrak  Newly documented pressure injury to sacrum, coccyx  Labs: CBGs 61-147, sodium 131 (L), BUN 34, Creatinine wdl Meds: ss novolog , thiamine , lasix    Diet Order:   Diet Order             DIET - DYS 1 Room service appropriate? Yes; Fluid consistency: Honey Thick  Diet effective now                   EDUCATION NEEDS:   No education needs have been identified at this time  Skin:  Skin Assessment: Skin Integrity Issues: Skin Integrity Issues:: Stage II, DTI DTI: sacrum Stage II: coccyx Incisions: Chest, R leg inicision closed  Last BM:  1/03  Height:   Ht Readings from Last 1 Encounters:  03/13/23 5' 8 (1.727 m)    Weight:   Wt Readings from Last 1 Encounters:  03/16/23 58.3 kg    BMI:  Body mass index is 19.54 kg/m.  Estimated Nutritional Needs:   Kcal:  1900-2200 kcals  Protein:  90-110 g  Fluid:  1.7L  Betsey Finger MS, RDN, LDN, CNSC Registered Dietitian 3 Clinical Nutrition RD Inpatient Contact Info in Amion

## 2023-03-16 NOTE — TOC Progression Note (Addendum)
 Transition of Care [facility]) - Progression Note    Patient Details  Name: Walter Vargas MRN: [id number] Date of Birth: Jan 30, 1946  [facility] CM/SW Contact  Arlana JINNY Nicholaus ISRAEL Phone Number: [phone] 03/16/2023, 11:16 AM  Clinical Narrative:   11:15 AM- HF CSW called to speak with pts sister, Steffi and left a VM asking to be contacted back. Her contact information is located on the face sheet. CSW will attempt to make contact with the pts sister.    TOC will continue following.          Expected Discharge Plan and Services   Discharge Planning Services: CM Consult                     DME Arranged: Walker rolling with seat DME Agency: [employer] Date DME Agency Contacted: 02/21/23 Time DME Agency Contacted: 1341 Representative spoke with at DME Agency: [place]             Social Determinants of Health (SDOH) Interventions SDOH Screenings   Food Insecurity: No Food Insecurity (02/14/2023)  Housing: Low Risk  (03/02/2023)  Transportation Needs: No Transportation Needs (02/14/2023)  Utilities: Not At Risk (02/14/2023)  Depression (PHQ2-9): Low Risk  (10/19/2022)  Social Connections: Patient Unable To Answer (03/12/2023)  Tobacco Use: Medium Risk (02/14/2023)    Readmission Risk Interventions     No data to display

## 2023-03-16 NOTE — Progress Notes (Signed)
 Physical Therapy Treatment Patient Details Name: Walter Vargas MRN: [id number] DOB: 1945-09-05 Today's Date: 03/16/2023   History of Present Illness 78 y.o. male admitted 02/13/22 with NSTEMI, CHF exacerbation. LHC with 3-vessel CAD. 12/9 Pt with fall in room. CABG delayed due to covid +. 12/19 S/p CABG x 5, TEE, and placement of impella and swan-ganz. Coagulopathic on return from OR, high chest tube output, required multiple blood products. 12/20 Back to OR for re-exploratation, bleeding from EPW. ETT 12/19 - 12/20. Chest tubes 12/19 - 12/21. Cortrak placed 12/20. 12/30 impella removed, swan placed. 12/31 swan out.  PMHx: HTN, HF, mod/severe MR.    PT Comments  Pt remains confused with inability to recall place, sx performed, or sternal precautions. Pt with improved standing trials from bed with increased BOS and improved balance. However, repeated trials to stand from chair pt requiring max assist and unable to fully rise. Pt educated for sternal precautions, transfers and progression. +2 assist remains necessary for lines, safety and rising from lower surfaces.   HR 77 98% on RA Pre gait 114/65 Post gait 121/65 (81)   If plan is discharge home, recommend the following: Assistance with cooking/housework;Assist for transportation;Supervision due to cognitive status;A lot of help with walking and/or transfers;Help with stairs or ramp for entrance;A lot of help with bathing/dressing/bathroom   Can travel by private vehicle        Equipment Recommendations  BSC/3in1    Recommendations for Other Services       Precautions / Restrictions Precautions Precautions: Fall;Sternal     Mobility  Bed Mobility Overal bed mobility: Needs Assistance Bed Mobility: Rolling, Sidelying to Sit Rolling: Mod assist Sidelying to sit: Min assist       General bed mobility comments: mod assist to roll to right as pt with difficulty following cues, min assist to clear leg and lift trunk from surface     Transfers Overall transfer level: Needs assistance   Transfers: Sit to/from Stand Sit to Stand: Mod assist, Max assist           General transfer comment: mod assist to rise from elevated bed with RW present and pt able to rise and steady with RW, initial retropulsion but improved from prior sessions. From lower recliner max assist to rise with left knee blocked, pt lifting sacrum but would not extend trunk and legs fully to rise x 2 trials from chair    Ambulation/Gait Ambulation/Gait assistance: Min assist, +2 safety/equipment Gait Distance (Feet): 200 Feet Assistive device: Rolling walker (2 wheels) Gait Pattern/deviations: Step-through pattern, Decreased stride length, Leaning posteriorly, Narrow base of support, Trunk flexed   Gait velocity interpretation: 1.31 - 2.62 ft/sec, indicative of limited community ambulator   General Gait Details: pt with flexed trunk, veering right with left trunk lean and min physical assist to maintain anterior progression and momentum. no standing rest this trial. cues for direction and +2 assist for lines/safety   Stairs             Wheelchair Mobility     Tilt Bed    Modified Rankin (Stroke Patients Only)       Balance Overall balance assessment: Needs assistance Sitting-balance support: No upper extremity supported, Feet supported Sitting balance-Leahy Scale: Fair Sitting balance - Comments: able to sit EOB and recliner   Standing balance support: Bilateral upper extremity supported, Reliant on assistive device for balance Standing balance-Leahy Scale: Poor Standing balance comment: RW in standing  Cognition Arousal: Alert Behavior During Therapy: Flat affect Overall Cognitive Status: Impaired/Different from baseline Area of Impairment: Attention, Memory, Safety/judgement, Following commands, Awareness, Problem solving, Orientation                 Orientation Level:  Disoriented to, Time, Situation, Place Current Attention Level: Sustained Memory: Decreased short-term memory, Decreased recall of precautions Following Commands: Follows one step commands with increased time, Follows one step commands inconsistently Safety/Judgement: Decreased awareness of safety, Decreased awareness of deficits   Problem Solving: Slow processing, Decreased initiation, Difficulty sequencing, Requires verbal cues, Requires tactile cues General Comments: no recall of sternal precautions, no awareness of deficits        Exercises      General Comments        Pertinent Vitals/Pain Pain Assessment Pain Assessment: No/denies pain    Home Living                          Prior Function            PT Goals (current goals can now be found in the care plan section) Progress towards PT goals: Progressing toward goals    Frequency    Min 1X/week      PT Plan      Co-evaluation              AM-PAC PT 6 Clicks Mobility   Outcome Measure  Help needed turning from your back to your side while in a flat bed without using bedrails?: A Little Help needed moving from lying on your back to sitting on the side of a flat bed without using bedrails?: A Lot Help needed moving to and from a bed to a chair (including a wheelchair)?: A Lot Help needed standing up from a chair using your arms (e.g., wheelchair or bedside chair)?: Total Help needed to walk in hospital room?: Total Help needed climbing 3-5 steps with a railing? : Total 6 Click Score: 10    End of Session Equipment Utilized During Treatment: Gait belt Activity Tolerance: Patient tolerated treatment well Patient left: in chair;with call bell/phone within reach;with chair alarm set;with nursing/sitter in room Nurse Communication: Mobility status;Precautions PT Visit Diagnosis: Other abnormalities of gait and mobility (R26.89);Unsteadiness on feet (R26.81);Muscle weakness (generalized)  (M62.81);Difficulty in walking, not elsewhere classified (R26.2)     Time: 8944-8880 PT Time Calculation (min) (ACUTE ONLY): 24 min  Charges:    $Gait Training: 8-22 mins $Therapeutic Activity: 8-22 mins PT General Charges $$ ACUTE PT VISIT: 1 Visit                     Lenoard SQUIBB, PT Acute Rehabilitation Services Office: [phone]    Lenoard NOVAK Ajax Schroll 03/16/2023, 12:22 PM

## 2023-03-17 LAB — COOXEMETRY PANEL
Carboxyhemoglobin: 2.6 % — ABNORMAL HIGH (ref 0.5–1.5)
Methemoglobin: <0.7 % (ref 0.0–1.5)
O2 Saturation: 68.3 %
Total hemoglobin: 8.9 g/dL — ABNORMAL LOW (ref 12.0–16.0)

## 2023-03-17 LAB — GLUCOSE, CAPILLARY
Glucose-Capillary: 105 mg/dL — ABNORMAL HIGH (ref 70–99)
Glucose-Capillary: 115 mg/dL — ABNORMAL HIGH (ref 70–99)
Glucose-Capillary: 61 mg/dL — ABNORMAL LOW (ref 70–99)
Glucose-Capillary: 74 mg/dL (ref 70–99)
Glucose-Capillary: 107 mg/dL — ABNORMAL HIGH (ref 70–99)

## 2023-03-17 LAB — RENAL FUNCTION PANEL
Albumin: 2.1 g/dL — ABNORMAL LOW (ref 3.5–5.0)
Anion gap: 6 (ref 5–15)
BUN: 36 mg/dL — ABNORMAL HIGH (ref 8–23)
CO2: 23 mmol/L (ref 22–32)
Calcium: 8.1 mg/dL — ABNORMAL LOW (ref 8.9–10.3)
Chloride: 103 mmol/L (ref 98–111)
Creatinine, Ser: 0.72 mg/dL (ref 0.61–1.24)
GFR, Estimated: >60 mL/min (ref >60)
Glucose, Bld: 151 mg/dL — ABNORMAL HIGH (ref 70–99)
Phosphorus: 3.2 mg/dL (ref 2.5–4.6)
Potassium: 4.6 mmol/L (ref 3.5–5.1)
Sodium: 132 mmol/L — ABNORMAL LOW (ref 135–145)

## 2023-03-17 MED ORDER — FUROSEMIDE 20 MG PO TABS
20.0000 mg | ORAL_TABLET | Freq: Every day | ORAL | Status: DC
  Administered 2023-03-17 – 2023-03-20 (×4): 20 mg via ORAL
  Filled 2023-03-17 (×4): qty 1

## 2023-03-17 MED ORDER — MILRINONE LACTATE IN DEXTROSE 20-5 MG/100ML-% IV SOLN
0.1250 ug/kg/min | INTRAVENOUS | Status: DC
  Administered 2023-03-17: 0.125 ug/kg/min via INTRAVENOUS
  Filled 2023-03-17: qty 100

## 2023-03-17 MED ORDER — DAPAGLIFLOZIN PROPANEDIOL 10 MG PO TABS
10.0000 mg | ORAL_TABLET | Freq: Every day | ORAL | Status: DC
Start: 2023-03-17 — End: 2023-03-20
  Administered 2023-03-17 – 2023-03-20 (×4): 10 mg via ORAL
  Filled 2023-03-17 (×4): qty 1

## 2023-03-17 NOTE — Progress Notes (Signed)
 Patient ID: Walter Vargas, male   DOB: 03-Jan-1946, 78 y.o.   MRN: [id number]      Advanced Heart Failure Rounding Note  Chief Complaint: Cardiogenic Shock  Subjective:    12/19: S/p CABG and Impella 5.5 Insertion. Coagulopathic on return from OR, high chest tube output, required multiple blood products. Subsequent VT associated w/ Impella suction alarms. Back to OR for re-exploratation, bleeding from EPW.  12/23: Bedside echo severe biventricular dysfunction. LVEF 25% RV moderately down. Impella well positioned 12/25: started on Zosyn  for fevers/suspected aspiration PNA. PCT 0.34  12/30: Impella 5.5 removed.   POD# 16  CO-OX 68% on milrinone  0.25. CVP 5.   Awake/alert, oriented to place this morning.  Tends to get confused overnight.  Doing some walking.   Meds:   amiodarone   200 mg Per Tube BID   ascorbic acid   500 mg Per Tube Daily   aspirin   81 mg Per Tube Daily   atorvastatin   80 mg Per Tube Daily   Chlorhexidine  Gluconate Cloth  6 each Topical Daily   dapagliflozin  propanediol  10 mg Oral Daily   digoxin   0.125 mg Per Tube Daily   docusate  200 mg Per Tube Daily   enoxaparin  (LOVENOX ) injection  40 mg Subcutaneous Q24H   feeding supplement  237 mL Oral TID BM   feeding supplement (PROSource TF20)  60 mL Per Tube Daily   feeding supplement (VITAL 1.5 CAL)  660 mL Per Tube Q24H   furosemide   20 mg Oral Daily   Gerhardt's butt cream   Topical Daily   guaiFENesin   15 mL Per Tube Q6H   insulin  aspart  0-9 Units Subcutaneous TID WC   losartan   12.5 mg Oral Daily   mouth rinse  15 mL Mouth Rinse 4 times per day   pantoprazole  (PROTONIX ) IV  40 mg Intravenous QHS   QUEtiapine   25 mg Per Tube BID   sodium chloride  flush  10-40 mL Intracatheter Q12H   spironolactone   12.5 mg Oral Daily   thiamine   100 mg Oral Daily   zinc  sulfate (50mg  elemental zinc )  220 mg Per Tube Daily     ceFAZolin  (ANCEF ) IV Stopped (03/17/23 0534)   milrinone        Objective:   Weight Range: 55  kg Body mass index is 18.44 kg/m.   Vital Signs:   Temp:  [97.6 F (36.4 C)-98.6 F (37 C)] 97.9 F (36.6 C) (01/04 0314) Pulse Rate:  [62-75] 65 (01/04 0700) Resp:  [14-29] 18 (01/04 0700) BP: (100-127)/(47-101) 108/56 (01/04 0700) SpO2:  [95 %-100 %] 97 % (01/04 0700) Arterial Line BP: (73-130)/(45-70) 130/45 (01/03 1000) Weight:  [55 kg] 55 kg (01/04 0500) Last BM Date : 03/16/23  Weight change: Filed Weights   03/15/23 0500 03/16/23 0341 03/17/23 0500  Weight: 57.5 kg 58.3 kg 55 kg   Intake/Output:   Intake/Output Summary (Last 24 hours) at 03/17/2023 0737 Last data filed at 03/17/2023 0600 Gross per 24 hour  Intake 1391.55 ml  Output 970 ml  Net 421.55 ml    CVP 5 Physical Exam  General: NAD, thin Neck: No JVD, no thyromegaly or thyroid nodule.  Lungs: Clear to auscultation bilaterally with normal respiratory effort. CV: Nondisplaced PMI.  Heart regular S1/S2, no S3/S4, no murmur.  No peripheral edema.   Abdomen: Soft, nontender, no hepatosplenomegaly, no distention.  Skin: Intact without lesions or rashes.  Neurologic: Alert and oriented x 3.  Psych: Normal affect. Extremities: No clubbing or  cyanosis.  HEENT: Normal.   Telemetry  SR 70 (personally reviewed)  Patient Profile   Walter Vargas is a 78 y.o. history of HTN, Walter, HFrEF, and severe 3V CAD. Admitted with NSTEMI and a/c CHF. S/p CABG and Impella 5.5 Insertion 12/19 w Dr. Kerrin.   Assessment/Plan   1.  Cardiogenic Shock:  Patient has had HF symptoms for at least a couple of months.  Suspect pre-existing ischemic cardiomyopathy then he presented this admission with NSTEMI possible in D2 territory leading to CHF exacerbation. RHC 12/3 showed normal filling pressures and low CI at 2.0. Pre CABG echo EF < 20%, RV moderately reduced (PAPi was adequate on RHC)Intra-op course complicated by cardiac stun and failure to wean from bypass. Impella 5.5 placed. Intra-op TEE EF 20%, RV mildly reduced.  POCUS 12/25 EF  25-30% RV improved. Impella removed 12/30. Co-ox 68%, CVP 5.  - Decrease milrinone  to 0.125. - Decrease Lasix  to 20 mg daily and add Farxiga  10 mg daily.  - Continue digoxin  0.125. - Continue spironolactone  12.5 mg and losartan  12.5 mg daily 2. CAD: Patient admitted with NSTEMI (?culprit D2) but with severe extensive diffuse 3 vessel disease.  Disease too extensive to treat percutaneously.  Anatomy felt best served by CABG.  s/p CABG and Impella 5.5 insertion w Dr. Kerrin 12/19. - Continue ASA 81. Add plavix  prior to discharge. - Continue atorva 80 3. Post-Operative Bleeding/ ABLA: Coagulopathic on return from OR. Supported with multiple units of blood products including RBCs, cryo, FFP and Factor VII + albumin  and protamine , had to return to the OR. Bleeding noted around chest tubes.  Now stable. CTs pulled.  Hgb 8.9.  4. 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  Patient has had on and off confusion in hospital. Worse in evenings/early am.  - Continue Seroquel  bid.  5. ID:  Suspected Aspiration PNA. Zosyn  course completed.  Now with possible cellulitis at vein harvest site, started on Ancef .  6. COVID+: Completed Paxlovid  7. Carotid artery stenosis: Pre-CABG dopplers with 80-99% left ICA stenosis. Seen by [facility] - Planning for outpatient follow-up in 1 month (post CABG) with CTA head and neck. 8. DVT prophylaxis: Enoxaparin .  Right lower leg asymmetric edema => venous dopplers negative for DVT.  9. FEN: nocturnal TFs. Aspiration risk, on dysphagia diet.  Not eating much, does not like dysphagia diet.  - Speech following 10. H/o VT: Peri-CABG.  - Continue amiodarone  200 mg bid.  11. Continue to mobilize, hopefully to CIR when off milrinone .   Walter Vargas 03/17/2023 7:37 AM

## 2023-03-17 NOTE — Progress Notes (Signed)
      [address]       [place] [id number]             [phone]                 5 Days Post-Op Procedure(s) (LRB): REMOVAL OF IMPELLA LEFT VENTRICULAR ASSIST DEVICE (N/A) TRANSESOPHAGEAL ECHOCARDIOGRAM (TEE) (N/A)   Events: No events _______________________________________________________________ Vitals: BP 119/63   Pulse 65   Temp 98.7 F (37.1 C) (Oral)   Resp (!) 26   Ht 5' 8 (1.727 m)   Wt 55 kg   SpO2 100%   BMI 18.44 kg/m  Filed Weights   03/15/23 0500 03/16/23 0341 03/17/23 0500  Weight: 57.5 kg 58.3 kg 55 kg     - Neuro: alert NAD  - Cardiovascular: sinus  Drips: milr 0.125.   CVP:  [0 mmHg-10 mmHg] 4 mmHg  - Pulm: EWOB    ABG    Component Value Date/Time   PHART 7.498 (H) 03/12/2023 1310   PCO2ART 31.3 (L) 03/12/2023 1310   PO2ART 78 (L) 03/12/2023 1310   HCO3 24.4 03/12/2023 1310   TCO2 25 03/12/2023 1310   ACIDBASEDEF 1.0 03/02/2023 1552   O2SAT 68.3 03/17/2023 0411    - Abd: ND - Extremity: warm  .Intake/Output      01/03 0701 01/04 0700 01/04 0701 01/05 0700   I.V. (mL/kg) 100 (1.8) 6.6 (0.1)   NG/GT 1050.7 110   IV Piggyback 300    Total Intake(mL/kg) 1450.7 (26.4) 116.6 (2.1)   Urine (mL/kg/hr) 970 (0.7)    Stool     Total Output 970    Net +480.7 +116.6           _______________________________________________________________ Labs:    Latest Ref Rng & Units 03/16/2023    3:30 AM 03/15/2023    4:32 AM 03/14/2023    4:29 AM  CBC  WBC 4.0 - 10.5 K/uL 9.4  12.7  11.5   Hemoglobin 13.0 - 17.0 g/dL 8.5  9.0  8.3   Hematocrit 39.0 - 52.0 % 27.4  28.0  26.1   Platelets 150 - 400 K/uL 575  683  455       Latest Ref Rng & Units 03/17/2023    4:11 AM 03/16/2023    3:30 AM 03/15/2023    4:32 AM  CMP  Glucose 70 - 99 mg/dL 848  846  848   BUN 8 - 23 mg/dL 36  34  40   Creatinine 0.61 - 1.24 mg/dL 9.27  9.28  9.06   Sodium 135 - 145 mmol/L 132  131  132   Potassium 3.5 - 5.1 mmol/L 4.6  4.2  3.9   Chloride 98  - 111 mmol/L 103  100  101   CO2 22 - 32 mmol/L 23  22  21    Calcium  8.9 - 10.3 mg/dL 8.1  8.4  8.2     CXR: -  _______________________________________________________________  Assessment and Plan: S/p CABG with impella placement on 12/19  Neuro: pain controlled CV: on milr.  Appreciate HF team Pulm: IS, pulm hygiene Renal: creat stable GI: on tube feeds.  Speech eval Heme: stable ID: [email or web address] Endo: SSI Dispo: continue ICU care.   Walter Vargas O Walter Vargas 03/17/2023 10:14 AM

## 2023-03-18 DIAGNOSIS — I5041 Acute combined systolic (congestive) and diastolic (congestive) heart failure: Secondary | ICD-10-CM

## 2023-03-18 LAB — CBC
HCT: 31.5 % — ABNORMAL LOW (ref 39.0–52.0)
Hemoglobin: 9.8 g/dL — ABNORMAL LOW (ref 13.0–17.0)
MCH: 28.3 pg (ref 26.0–34.0)
MCHC: 31.1 g/dL (ref 30.0–36.0)
MCV: 91 fL (ref 80.0–100.0)
Platelets: 745 10*3/uL — ABNORMAL HIGH (ref 150–400)
RBC: 3.46 MIL/uL — ABNORMAL LOW (ref 4.22–5.81)
RDW: 17.7 % — ABNORMAL HIGH (ref 11.5–15.5)
WBC: 9.2 10*3/uL (ref 4.0–10.5)
nRBC: 0 % (ref 0.0–0.2)

## 2023-03-18 LAB — RENAL FUNCTION PANEL
Albumin: 2.2 g/dL — ABNORMAL LOW (ref 3.5–5.0)
Anion gap: 8 (ref 5–15)
BUN: 36 mg/dL — ABNORMAL HIGH (ref 8–23)
CO2: 23 mmol/L (ref 22–32)
Calcium: 8.2 mg/dL — ABNORMAL LOW (ref 8.9–10.3)
Chloride: 100 mmol/L (ref 98–111)
Creatinine, Ser: 0.69 mg/dL (ref 0.61–1.24)
GFR, Estimated: >60 mL/min (ref >60)
Glucose, Bld: 153 mg/dL — ABNORMAL HIGH (ref 70–99)
Phosphorus: 3.4 mg/dL (ref 2.5–4.6)
Potassium: 4.5 mmol/L (ref 3.5–5.1)
Sodium: 131 mmol/L — ABNORMAL LOW (ref 135–145)

## 2023-03-18 LAB — GLUCOSE, CAPILLARY
Glucose-Capillary: 84 mg/dL (ref 70–99)
Glucose-Capillary: 85 mg/dL (ref 70–99)
Glucose-Capillary: 95 mg/dL (ref 70–99)
Glucose-Capillary: 87 mg/dL (ref 70–99)

## 2023-03-18 LAB — COOXEMETRY PANEL
Carboxyhemoglobin: 3 % — ABNORMAL HIGH (ref 0.5–1.5)
Methemoglobin: 0.8 % (ref 0.0–1.5)
O2 Saturation: 61.9 %
Total hemoglobin: 9.3 g/dL — ABNORMAL LOW (ref 12.0–16.0)

## 2023-03-18 LAB — MAGNESIUM: Magnesium: 1.9 mg/dL (ref 1.7–2.4)

## 2023-03-18 NOTE — Progress Notes (Signed)
 EVENING ROUNDS NOTE :     [address]       [place] [address]             [phone]                 6 Days Post-Op Procedure(s) (LRB): REMOVAL OF IMPELLA LEFT VENTRICULAR ASSIST DEVICE (N/A) TRANSESOPHAGEAL ECHOCARDIOGRAM (TEE) (N/A)   Total Length of Stay:  LOS: 32 days  Events:   No events     BP (!) 112/48   Pulse 65   Temp 97.9 F (36.6 C) (Oral)   Resp (!) 22   Ht 5' 8 (1.727 m)   Wt 57 kg   SpO2 98%   BMI 19.11 kg/m   CVP:  [0 mmHg-11 mmHg] 5 mmHg       ceFAZolin  (ANCEF ) IV Stopped (03/18/23 1530)   milrinone  0.125 mcg/kg/min (03/18/23 1900)    I/O last 3 completed shifts: In: 3533.4 [P.O.:1395; I.V.:76.5; Other:80; WH/HU:8524; IV Piggyback:506.9] Out: 1900 [Urine:1900]      Latest Ref Rng & Units 03/18/2023   10:54 AM 03/16/2023    3:30 AM 03/15/2023    4:32 AM  CBC  WBC 4.0 - 10.5 K/uL 9.2  9.4  12.7   Hemoglobin 13.0 - 17.0 g/dL 9.8  8.5  9.0   Hematocrit 39.0 - 52.0 % 31.5  27.4  28.0   Platelets 150 - 400 K/uL 745  575  683        Latest Ref Rng & Units 03/18/2023    3:46 AM 03/17/2023    4:11 AM 03/16/2023    3:30 AM  BMP  Glucose 70 - 99 mg/dL 846  848  846   BUN 8 - 23 mg/dL 36  36  34   Creatinine 0.61 - 1.24 mg/dL 9.30  9.27  9.28   Sodium 135 - 145 mmol/L 131  132  131   Potassium 3.5 - 5.1 mmol/L 4.5  4.6  4.2   Chloride 98 - 111 mmol/L 100  103  100   CO2 22 - 32 mmol/L 23  23  22    Calcium  8.9 - 10.3 mg/dL 8.2  8.1  8.4     ABG    Component Value Date/Time   PHART 7.498 (H) 03/12/2023 1310   PCO2ART 31.3 (L) 03/12/2023 1310   PO2ART 78 (L) 03/12/2023 1310   HCO3 24.4 03/12/2023 1310   TCO2 25 03/12/2023 1310   ACIDBASEDEF 1.0 03/02/2023 1552   O2SAT 61.9 03/18/2023 0350       Linnie Rayas, MD 03/18/2023 7:45 PM

## 2023-03-18 NOTE — Progress Notes (Signed)
 Patient ID: Walter Vargas, male   DOB: [birth year], 78 y.o.   MRN: [id number]      Advanced Heart Failure Rounding Note  Chief Complaint: Cardiogenic Shock  Subjective:    12/19: S/p CABG and Impella 5.5 Insertion. Coagulopathic on return from OR, high chest tube output, required multiple blood products. Subsequent VT associated w/ Impella suction alarms. Back to OR for re-exploratation, bleeding from EPW.  12/23: Bedside echo severe biventricular dysfunction. LVEF 25% RV moderately down. Impella well positioned 12/25: started on Zosyn  for fevers/suspected aspiration PNA. PCT 0.34  12/30: Impella 5.5 removed.   POD# 17 - Intermittent delirium - Sitting up at bedside today; hemodynamically stable.  - CVP 5-7  Meds:   amiodarone   200 mg Per Tube BID   ascorbic acid   500 mg Per Tube Daily   aspirin   81 mg Per Tube Daily   atorvastatin   80 mg Per Tube Daily   Chlorhexidine  Gluconate Cloth  6 each Topical Daily   dapagliflozin  propanediol  10 mg Oral Daily   digoxin   0.125 mg Per Tube Daily   docusate  200 mg Per Tube Daily   enoxaparin  (LOVENOX ) injection  40 mg Subcutaneous Q24H   feeding supplement  237 mL Oral TID BM   feeding supplement (PROSource TF20)  60 mL Per Tube Daily   feeding supplement (VITAL 1.5 CAL)  660 mL Per Tube Q24H   furosemide   20 mg Oral Daily   Gerhardt's butt cream   Topical Daily   guaiFENesin   15 mL Per Tube Q6H   insulin  aspart  0-9 Units Subcutaneous TID WC   losartan   12.5 mg Oral Daily   mouth rinse  15 mL Mouth Rinse 4 times per day   pantoprazole  (PROTONIX ) IV  40 mg Intravenous QHS   QUEtiapine   25 mg Per Tube BID   sodium chloride  flush  10-40 mL Intracatheter Q12H   spironolactone   12.5 mg Oral Daily   thiamine   100 mg Oral Daily   zinc  sulfate (50mg  elemental zinc )  220 mg Per Tube Daily     ceFAZolin  (ANCEF ) IV Stopped (03/18/23 0610)   milrinone  0.125 mcg/kg/min (03/18/23 1000)     Objective:   Weight Range: 57 kg Body mass index  is 19.11 kg/m.   Vital Signs:   Temp:  [97.6 F (36.4 C)-98.2 F (36.8 C)] 97.9 F (36.6 C) (01/05 1100) Pulse Rate:  [61-74] 70 (01/05 1145) Resp:  [14-31] 22 (01/05 1145) BP: (101-136)/(47-103) 126/63 (01/05 1145) SpO2:  [89 %-100 %] 98 % (01/05 1145) Weight:  [57 kg] 57 kg (01/05 0500) Last BM Date : 03/17/23  Weight change: Filed Weights   03/16/23 0341 03/17/23 0500 03/18/23 0500  Weight: 58.3 kg 55 kg 57 kg   Intake/Output:   Intake/Output Summary (Last 24 hours) at 03/18/2023 1213 Last data filed at 03/18/2023 1100 Gross per 24 hour  Intake 2325.43 ml  Output 1000 ml  Net 1325.43 ml    CVP 5-7 Physical Exam  General: sitting at bedside; NAD Neck: No JVD, no thyromegaly or thyroid nodule.  Lungs: Clear to auscultation bilaterally with normal respiratory effort. CV: RRR; no murmurs.  Abdomen: Soft, nontender, no hepatosplenomegaly, no distention.  Skin: erythema / cellulitis in the RLE Neurologic: AAOX3 Psych: Normal affect. Extremities: Nno edema.  HEENT: Normal.   Telemetry  SR 70 (personally reviewed)  Patient Profile   Walter Vargas is a 78 y.o. history of HTN, Walter, HFrEF, and severe 3V CAD. Admitted with  NSTEMI and a/c CHF. S/p CABG and Impella 5.5 Insertion 12/19 w Dr. Kerrin.   Assessment/Plan   1.  Cardiogenic Shock:  Patient has had HF symptoms for at least a couple of months.  Suspect pre-existing ischemic cardiomyopathy then he presented this admission with NSTEMI possible in D2 territory leading to CHF exacerbation. RHC 12/3 showed normal filling pressures and low CI at 2.0. Pre CABG echo EF < 20%, RV moderately reduced (PAPi was adequate on RHC)Intra-op course complicated by cardiac stun and failure to wean from bypass. Impella 5.5 placed. Intra-op TEE EF 20%, RV mildly reduced.   - continue milrinone  to 0.125; repeat mixed venous this afternoon. - Decrease Lasix  to 20 mg daily and add Farxiga  10 mg daily.  - Continue digoxin  0.125; check dig level  later today.  - Continue spironolactone  12.5 mg and losartan  12.5 mg daily; increase to 25mg  tomorrow to allow for milrinone  wean.   2. CAD: Patient admitted with NSTEMI (?culprit D2) but with severe extensive diffuse 3 vessel disease.  Disease too extensive to treat percutaneously.  Anatomy felt best served by CABG.  s/p CABG and Impella 5.5 insertion w Dr. Kerrin 12/19. - Continue ASA 81. Add plavix  prior to discharge. - Continue atorva 80  3. Post-Operative Bleeding/ ABLA: Coagulopathic on return from OR. Supported with multiple units of blood products including RBCs, cryo, FFP and Factor VII + albumin  and protamine , had to return to the OR. Bleeding noted around chest tubes.  Now stable. CTs pulled.  Hgb 9.8  4. 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  Patient has had on and off confusion in hospital. Worse in evenings/early am.  - Continue Seroquel  bid.   5. ID:   - Completed zosyn  for Suspected Aspiration PNA. Z - Now with possible cellulitis at vein harvest site, started on Ancef .  - WBC ct improving; down to 9.2 today.   6. COVID+: Completed Paxlovid   7. Carotid artery stenosis: Pre-CABG dopplers with 80-99% left ICA stenosis. Seen by [facility] - Planning for outpatient follow-up in 1 month (post CABG) with CTA head and neck.  8. DVT prophylaxis: Enoxaparin .  Right lower leg asymmetric edema => venous dopplers negative for DVT.   9. FEN: nocturnal TFs. Aspiration risk, on dysphagia diet.  Not eating much, does not like dysphagia diet.  - Speech following  10. H/o VT: Peri-CABG.  - Continue amiodarone  200 mg bid.   11. Continue to mobilize, hopefully to CIR when off milrinone .   Jordin Dambrosio 03/18/2023 12:13 PM

## 2023-03-18 NOTE — Progress Notes (Addendum)
 [address]       [place] [id number]             [phone]                 6 Days Post-Op Procedure(s) (LRB): REMOVAL OF IMPELLA LEFT VENTRICULAR ASSIST DEVICE (N/A) TRANSESOPHAGEAL ECHOCARDIOGRAM (TEE) (N/A)   Events: No events Pain in leg _______________________________________________________________ Vitals: BP 121/63   Pulse 65   Temp 98.1 F (36.7 C) (Oral)   Resp 19   Ht 5' 8 (1.727 m)   Wt 57 kg   SpO2 100%   BMI 19.11 kg/m  Filed Weights   03/16/23 0341 03/17/23 0500 03/18/23 0500  Weight: 58.3 kg 55 kg 57 kg     - Neuro: alert NAD  - Cardiovascular: sinus  Drips: milr 0.125.   CVP:  [0 mmHg-17 mmHg] 5 mmHg  - Pulm: EWOB    ABG    Component Value Date/Time   PHART 7.498 (H) 03/12/2023 1310   PCO2ART 31.3 (L) 03/12/2023 1310   PO2ART 78 (L) 03/12/2023 1310   HCO3 24.4 03/12/2023 1310   TCO2 25 03/12/2023 1310   ACIDBASEDEF 1.0 03/02/2023 1552   O2SAT 61.9 03/18/2023 0350    - Abd: ND - Extremity: warm  .Intake/Output      01/04 0701 01/05 0700 01/05 0701 01/06 0700   P.O. 1060    I.V. (mL/kg) 51.8 (0.9)    Other 70    NG/GT 1185    IV Piggyback 300.2    Total Intake(mL/kg) 2667 (46.8)    Urine (mL/kg/hr) 1200 (0.9)    Stool 0    Total Output 1200    Net +1467         Stool Occurrence 2 x       _______________________________________________________________ Labs:    Latest Ref Rng & Units 03/16/2023    3:30 AM 03/15/2023    4:32 AM 03/14/2023    4:29 AM  CBC  WBC 4.0 - 10.5 K/uL 9.4  12.7  11.5   Hemoglobin 13.0 - 17.0 g/dL 8.5  9.0  8.3   Hematocrit 39.0 - 52.0 % 27.4  28.0  26.1   Platelets 150 - 400 K/uL 575  683  455       Latest Ref Rng & Units 03/18/2023    3:46 AM 03/17/2023    4:11 AM 03/16/2023    3:30 AM  CMP  Glucose 70 - 99 mg/dL 846  848  846   BUN 8 - 23 mg/dL 36  36  34   Creatinine 0.61 - 1.24 mg/dL 9.30  9.27  9.28   Sodium 135 - 145 mmol/L 131  132  131   Potassium 3.5 - 5.1  mmol/L 4.5  4.6  4.2   Chloride 98 - 111 mmol/L 100  103  100   CO2 22 - 32 mmol/L 23  23  22    Calcium  8.9 - 10.3 mg/dL 8.2  8.1  8.4     CXR: -  _______________________________________________________________  Assessment and Plan: S/p CABG with impella placement on 12/19  Neuro: pain controlled CV: on milr.  Appreciate HF team Pulm: IS, pulm hygiene Renal: creat stable GI: on tube feeds.  Speech eval Heme: stable ID: afebrile, and ancef  for cellulitis.  Checking CBC, if elevated WBC will get US  to check for fluid collection Endo: SSI Dispo: continue ICU care.   Walter Vargas 03/18/2023 9:35 AM

## 2023-03-19 ENCOUNTER — Inpatient Hospital Stay (HOSPITAL_COMMUNITY): Payer: 59

## 2023-03-19 DIAGNOSIS — T8111XD Postprocedural  cardiogenic shock, subsequent encounter: Secondary | ICD-10-CM

## 2023-03-19 DIAGNOSIS — I7090 Unspecified atherosclerosis: Secondary | ICD-10-CM | POA: Diagnosis not present

## 2023-03-19 LAB — COOXEMETRY PANEL
Carboxyhemoglobin: 2 % — ABNORMAL HIGH (ref 0.5–1.5)
Carboxyhemoglobin: 2.1 % — ABNORMAL HIGH (ref 0.5–1.5)
Methemoglobin: <0.7 % (ref 0.0–1.5)
Methemoglobin: 0.9 % (ref 0.0–1.5)
O2 Saturation: 58.9 %
O2 Saturation: 57.6 %
Total hemoglobin: 9.8 g/dL — ABNORMAL LOW (ref 12.0–16.0)
Total hemoglobin: 10.3 g/dL — ABNORMAL LOW (ref 12.0–16.0)

## 2023-03-19 LAB — CBC
HCT: 30.4 % — ABNORMAL LOW (ref 39.0–52.0)
Hemoglobin: 9.5 g/dL — ABNORMAL LOW (ref 13.0–17.0)
MCH: 29 pg (ref 26.0–34.0)
MCHC: 31.3 g/dL (ref 30.0–36.0)
MCV: 92.7 fL (ref 80.0–100.0)
Platelets: 666 10*3/uL — ABNORMAL HIGH (ref 150–400)
RBC: 3.28 MIL/uL — ABNORMAL LOW (ref 4.22–5.81)
RDW: 17.8 % — ABNORMAL HIGH (ref 11.5–15.5)
WBC: 7.9 10*3/uL (ref 4.0–10.5)
nRBC: 0 % (ref 0.0–0.2)

## 2023-03-19 LAB — GLUCOSE, CAPILLARY
Glucose-Capillary: 137 mg/dL — ABNORMAL HIGH (ref 70–99)
Glucose-Capillary: 86 mg/dL (ref 70–99)
Glucose-Capillary: 85 mg/dL (ref 70–99)
Glucose-Capillary: 92 mg/dL (ref 70–99)

## 2023-03-19 LAB — RENAL FUNCTION PANEL
Albumin: 2.2 g/dL — ABNORMAL LOW (ref 3.5–5.0)
Anion gap: 8 (ref 5–15)
BUN: 38 mg/dL — ABNORMAL HIGH (ref 8–23)
CO2: 22 mmol/L (ref 22–32)
Calcium: 8.1 mg/dL — ABNORMAL LOW (ref 8.9–10.3)
Chloride: 99 mmol/L (ref 98–111)
Creatinine, Ser: 0.7 mg/dL (ref 0.61–1.24)
GFR, Estimated: >60 mL/min (ref >60)
Glucose, Bld: 135 mg/dL — ABNORMAL HIGH (ref 70–99)
Phosphorus: 3.4 mg/dL (ref 2.5–4.6)
Potassium: 4.6 mmol/L (ref 3.5–5.1)
Sodium: 129 mmol/L — ABNORMAL LOW (ref 135–145)

## 2023-03-19 MED ORDER — LOSARTAN POTASSIUM 25 MG PO TABS
25.0000 mg | ORAL_TABLET | Freq: Every day | ORAL | Status: DC
  Administered 2023-03-19 – 2023-03-20 (×2): 25 mg via ORAL
  Filled 2023-03-19 (×2): qty 1

## 2023-03-19 MED ORDER — PANTOPRAZOLE SODIUM 40 MG PO TBEC
40.0000 mg | DELAYED_RELEASE_TABLET | Freq: Every day | ORAL | Status: DC
  Administered 2023-03-19 – 2023-03-20 (×2): 40 mg via ORAL
  Filled 2023-03-19 (×2): qty 1

## 2023-03-19 NOTE — Progress Notes (Signed)
 Patient arrived from 2H to 4E.  VSS.  Patient states he has no pain.  No complaints.    03/19/23 1603  Vitals  Temp 97.8 F (36.6 C)  Temp Source Oral  BP 121/64  MAP (mmHg) 80  BP Location Left Arm  BP Method Automatic  Patient Position (if appropriate) Lying  Pulse Rate 68  Pulse Rate Source Monitor  ECG Heart Rate 69  Resp 20  Level of Consciousness  Level of Consciousness Alert  MEWS COLOR  MEWS Score Color Green  Oxygen Therapy  SpO2 100 %  O2 Device Room Air  Pain Assessment  Pain Scale 0-10  Pain Score 0  MEWS Score  MEWS Temp 0  MEWS Systolic 0  MEWS Pulse 0  MEWS RR 0  MEWS LOC 0  MEWS Score 0

## 2023-03-19 NOTE — Progress Notes (Signed)
 Patient ID: Walter Vargas, male   DOB: [birth year], 78 y.o.   MRN: [id number]      Advanced Heart Failure Rounding Note  Chief Complaint: Cardiogenic Shock  Subjective:    12/19: S/p CABG and Impella 5.5 Insertion. Coagulopathic on return from OR, high chest tube output, required multiple blood products. Subsequent VT associated w/ Impella suction alarms. Back to OR for re-exploratation, bleeding from EPW.  12/23: Bedside echo severe biventricular dysfunction. LVEF 25% RV moderately down. Impella well positioned 12/25: started on Zosyn  for fevers/suspected aspiration PNA. PCT 0.34  12/30: Impella 5.5 removed.   POD# 18 - mentation better today, family at bedside - Sitting up at bedside today; hemodynamically stable.  - CVP 9  Meds:   amiodarone   200 mg Per Tube BID   ascorbic acid   500 mg Per Tube Daily   aspirin   81 mg Per Tube Daily   atorvastatin   80 mg Per Tube Daily   Chlorhexidine  Gluconate Cloth  6 each Topical Daily   dapagliflozin  propanediol  10 mg Oral Daily   digoxin   0.125 mg Per Tube Daily   docusate  200 mg Per Tube Daily   enoxaparin  (LOVENOX ) injection  40 mg Subcutaneous Q24H   feeding supplement  237 mL Oral TID BM   feeding supplement (PROSource TF20)  60 mL Per Tube Daily   feeding supplement (VITAL 1.5 CAL)  660 mL Per Tube Q24H   furosemide   20 mg Oral Daily   Gerhardt's butt cream   Topical Daily   guaiFENesin   15 mL Per Tube Q6H   insulin  aspart  0-9 Units Subcutaneous TID WC   losartan   12.5 mg Oral Daily   mouth rinse  15 mL Mouth Rinse 4 times per day   pantoprazole  (PROTONIX ) IV  40 mg Intravenous QHS   QUEtiapine   25 mg Per Tube BID   sodium chloride  flush  10-40 mL Intracatheter Q12H   spironolactone   12.5 mg Oral Daily   thiamine   100 mg Oral Daily   zinc  sulfate (50mg  elemental zinc )  220 mg Per Tube Daily     ceFAZolin  (ANCEF ) IV Stopped (03/19/23 0649)   milrinone  0.125 mcg/kg/min (03/19/23 0700)     Objective:   Weight Range: 60  kg Body mass index is 20.11 kg/m.   Vital Signs:   Temp:  [97.7 F (36.5 C)-98.1 F (36.7 C)] 98 F (36.7 C) (01/06 0309) Pulse Rate:  [60-74] 69 (01/06 0630) Resp:  [15-29] 18 (01/06 0630) BP: (101-137)/(47-97) 126/66 (01/06 0630) SpO2:  [97 %-100 %] 99 % (01/06 0630) Weight:  [60 kg] 60 kg (01/06 0500) Last BM Date : 03/17/23  Weight change: Filed Weights   03/17/23 0500 03/18/23 0500 03/19/23 0500  Weight: 55 kg 57 kg 60 kg   Intake/Output:   Intake/Output Summary (Last 24 hours) at 03/19/2023 0949 Last data filed at 03/19/2023 0700 Gross per 24 hour  Intake 1746.8 ml  Output 1100 ml  Net 646.8 ml    CVP 9 Physical Exam  General:  elderly appearing.  No respiratory difficulty HEENT: normal Neck: supple. JVD ~8 cm. Carotids 2+ bilat; no bruits. No lymphadenopathy or thyromegaly appreciated. Cor: PMI nondisplaced. Regular rate & rhythm. No rubs, gallops or murmurs. Lungs: clear Abdomen: soft, nontender, nondistended. No hepatosplenomegaly. No bruits or masses. Good bowel sounds. Extremities: no cyanosis, clubbing, rash, edema  Neuro: alert & oriented x 2-3, cranial nerves grossly intact. moves all 4 extremities w/o difficulty. Affect pleasant.  Telemetry  SR 60s (personally reviewed) Patient Profile   Walter Vargas is a 78 y.o. history of HTN, Walter, HFrEF, and severe 3V CAD. Admitted with NSTEMI and a/c CHF. S/p CABG and Impella 5.5 Insertion 12/19 w Dr. Kerrin.   Assessment/Plan  1.  Cardiogenic Shock:  Patient has had HF symptoms for at least a couple of months.  Suspect pre-existing ischemic cardiomyopathy then he presented this admission with NSTEMI possible in D2 territory leading to CHF exacerbation. RHC 12/3 showed normal filling pressures and low CI at 2.0. Pre CABG echo EF < 20%, RV moderately reduced (PAPi was adequate on RHC)Intra-op course complicated by cardiac stun and failure to wean from bypass. Impella 5.5 placed. Intra-op TEE EF 20%, RV mildly reduced.    - Stop milrinone  today repeat co-ox later today - Continue Lasix  20 mg daily  - Continue Farxiga  10 mg daily.  - Continue digoxin  0.125; dig level 0.6 1/6 - Continue spironolactone  12.5 mg  - Increase losartan  12.5>25 mg daily  2. CAD: Patient admitted with NSTEMI (?culprit D2) but with severe extensive diffuse 3 vessel disease.  Disease too extensive to treat percutaneously.  Anatomy felt best served by CABG.  s/p CABG and Impella 5.5 insertion w Dr. Kerrin 12/19. - Continue ASA 81. Add plavix  prior to discharge. - Continue atorva 80  3. Post-Operative Bleeding/ ABLA: Coagulopathic on return from OR. Supported with multiple units of blood products including RBCs, cryo, FFP and Factor VII + albumin  and protamine , had to return to the OR. Bleeding noted around chest tubes.  Now stable. CTs pulled.  Hgb 9.5  4. 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  Patient has had on and off confusion in hospital. Worse in evenings/early am.  - Continue Seroquel  bid.   5. ID:   - Completed zosyn  for Suspected Aspiration PNA. Z - Now with possible cellulitis at vein harvest site, started on Ancef .  - WBC ct improving; down to 7.9 today.   6. COVID+: Completed Paxlovid   7. Carotid artery stenosis: Pre-CABG dopplers with 80-99% left ICA stenosis. Seen by [facility] - Planning for outpatient follow-up in 1 month (post CABG) with CTA head and neck.  8. DVT prophylaxis: Enoxaparin .  Right lower leg asymmetric edema => venous dopplers negative for DVT.   9. FEN: nocturnal TFs. Aspiration risk, on dysphagia diet.  Not eating much, does not like dysphagia diet.  - Speech following  10. H/o VT: Peri-CABG.  - Continue amiodarone  200 mg bid.   11. Continue to mobilize, plan for CIR  Transfer to floor today, awaiting CIR  Beckey LITTIE Coe 03/19/2023 9:49 AM

## 2023-03-19 NOTE — Consult Note (Addendum)
 WOC Nurse wound follow up Wound type: Deep pressure injury to sacrum healed.  Red skin, wound healed, skin intact. Pt is critically ill with multiple systemic factors which can impair healing. He is on a low airloss mattress to decrease pressure.  Pressure Injury POA: No   Dressing procedure/placement/frequency: Apply foam dressing to prevent further injuries and change Q3 days or PRN soiling.    WOC team will not plan to follow further.  Please reconsult if further assistance is needed. Thank-you,  Lela Holm BSN, RN, [employer], WOC  (Pager: [phone])

## 2023-03-19 NOTE — Progress Notes (Signed)
 Inpatient Rehab Admissions Coordinator:   Awaiting insurance determination from [facility].  Will continue to follow.   Estill Dooms, PT, DPT Admissions Coordinator [phone] 03/19/23  11:34 AM

## 2023-03-19 NOTE — Progress Notes (Signed)
 Nutrition Follow-up  DOCUMENTATION CODES:   Severe malnutrition in context of chronic illness  INTERVENTION:   Discontinue calorie count. Continue nocturnal TF via Cortrak: Vital 1.5 at 55 ml/h from 6pm-6am with Prosource TF20 60 ml once daily. Provides 1070 kcal, 65 gm protein, 504 ml free water daily. Continue PO supplements (Ensure Enlive or Nepro Shake) PO TID between meals. Continue vitamin C  and zinc .   NUTRITION DIAGNOSIS:   Severe Malnutrition related to chronic illness (HFrEF, 3V CAD, MR) as evidenced by severe fat depletion, severe muscle depletion.  Ongoing   GOAL:   Patient will meet greater than or equal to 90% of their needs  Progressing   MONITOR:   PO intake, Supplement acceptance, TF tolerance, Skin, Weight trends, Labs  REASON FOR ASSESSMENT:   Ventilator    ASSESSMENT:   Pt remains admitted with heart failure, NSTEMI and cardiogenic shock. PMH significant for HTN, MR, HFrEF and severe 3V CAD.  Plans for transfer out of the ICU today.  Awaiting insurance authorization for CIR. Patient remains on a dysphagia 1 diet with honey thick liquids.  Calorie Count: 1/3 Lunch: 106 kcal, 7 gm protein  1/4 Breakfast: 471 kcal, 27 gm protein  1/5 Breakfast: 300 kcal, 15 gm protein 1/5 Lunch: 450 kcal, 19 gm protein (includes 1 Nepro shake)  1/5 Dinner: 425 kcal, 19 gm protein (did not eat lunch, just had a Nepro shake) 1/5 Total: 1175 kcal, 53 gm protein  Intake is meeting approximately 50% of estimated protein and calorie needs.   Currently receiving nocturnal TF via Cortrak with Vital 1.5 at 55 ml/h from 6pm-6am with Prosource TF20 60 ml once daily. This provides 1070 kcal, 65 gm protein, 504 ml free water daily.   PO intake + TF is meeting 100% of estimated nutrition needs.   SLP working with patient on swallowing and wants to repeat MBS next date for hopeful diet advancement.   Suspect patient will eat better with diet advancement. Most of his oral  intake is coming from PO supplements (Nepro shake).   Labs and medications reviewed. Receiving vitamin C  and zinc  to support wound healing.   Admission weight 56.5 kg (02/14/23) Current weight 60 kg (03/19/23)  Diet Order:   Diet Order             DIET - DYS 1 Room service appropriate? Yes; Fluid consistency: Honey Thick  Diet effective now                   EDUCATION NEEDS:   No education needs have been identified at this time  Skin:  Skin Assessment: Skin Integrity Issues: Skin Integrity Issues:: Stage II, DTI DTI: sacrum Stage II: coccyx Incisions: Chest, R leg inicision closed  Last BM:  1/6 type 6, brown, medium  Height:   Ht Readings from Last 1 Encounters:  03/13/23 5' 8 (1.727 m)    Weight:   Wt Readings from Last 1 Encounters:  03/19/23 60 kg    BMI:  Body mass index is 20.11 kg/m.  Estimated Nutritional Needs:   Kcal:  1900-2200 kcals  Protein:  90-110 g  Fluid:  1.7L   Suzen HUNT RD, LDN, CNSC Contact Inpatient RD using Secure Chat. If unavailable, use group chat RD Inpatient via Secure Chat in EPIC.

## 2023-03-19 NOTE — Progress Notes (Addendum)
 VASCULAR LAB    Bilateral lower extremity arterial duplex has been performed.  See CV proc for preliminary results.   Talasia Saulter, RVT 03/19/2023, 11:47 AM

## 2023-03-19 NOTE — Progress Notes (Signed)
 Occupational Therapy Treatment Patient Details Name: Walter Vargas MRN: [id number] DOB: [birth year] Today's Date: 03/19/2023   History of present illness 78 y.o. male admitted 02/13/22 with NSTEMI, CHF exacerbation. LHC with 3-vessel CAD. 12/9 Pt with fall in room. CABG delayed due to covid +. 12/19 S/p CABG x 5, TEE, and placement of impella and swan-ganz. Coagulopathic on return from OR, high chest tube output, required multiple blood products. 12/20 Back to OR for re-exploratation, bleeding from EPW. ETT 12/19 - 12/20. Chest tubes 12/19 - 12/21. Cortrak placed 12/20. 12/30 impella removed, swan placed. 12/31 swan out.  PMHx: HTN, HF, mod/severe MR.   OT comments  Pt progressing well toward established OT goals this session. All goals upgraded accordingly. Challenging cognition, orientation, and activity tolerance. Pt oriented this session, able to follow one step commands in minimally distracting environments, and maintaining precautions with up to mod cues during ADL and mobility. Pt continues to require +2 assist for safety. Pt hitting LLE on RW several times during mobility despite cues to remain within RW; poor awareness. Pt able to perform flutter valve several times; required max cues for appropriate use of IS and able to reach ~750. Continue to recommend intensive inpatient rehab.       If plan is discharge home, recommend the following:  Two people to help with walking and/or transfers;Two people to help with bathing/dressing/bathroom;Assistance with cooking/housework;Direct supervision/assist for medications management;Direct supervision/assist for financial management;Assist for transportation;Help with stairs or ramp for entrance;Assistance with feeding   Equipment Recommendations  Other (comment) (defer next venue of care)    Recommendations for Other Services      Precautions / Restrictions Precautions Precautions: Fall;Sternal Precaution Booklet Issued: No Restrictions Other  Position/Activity Restrictions: sternal precautions       Mobility Bed Mobility Overal bed mobility: Needs Assistance Bed Mobility: Rolling, Sidelying to Sit Rolling: Mod assist Sidelying to sit: Min assist       General bed mobility comments: mod A to come to full sidelying; min A for truncal elevation    Transfers Overall transfer level: Needs assistance Equipment used: Rolling walker (2 wheels) Transfers: Sit to/from Stand Sit to Stand: Mod assist           General transfer comment: mod A for rise and steady     Balance Overall balance assessment: Needs assistance Sitting-balance support: No upper extremity supported, Feet supported Sitting balance-Leahy Scale: Fair Sitting balance - Comments: able to sit EOB and recliner Postural control: Posterior lean Standing balance support: Bilateral upper extremity supported, Reliant on assistive device for balance Standing balance-Leahy Scale: Poor Standing balance comment: RW in standing statically; external support and RW for dynamic                           ADL either performed or assessed with clinical judgement   ADL Overall ADL's : Needs assistance/impaired     Grooming: Oral care;Sitting;Set up Grooming Details (indicate cue type and reason): using suction toothbrush             Lower Body Dressing: Moderate assistance;Sitting/lateral leans Lower Body Dressing Details (indicate cue type and reason): socks Toilet Transfer: Minimal assistance;Stand-pivot;Rolling walker (2 wheels)           Functional mobility during ADLs: Moderate assistance;Minimal assistance;Rolling walker (2 wheels)      Extremity/Trunk Assessment Upper Extremity Assessment Upper Extremity Assessment: Generalized weakness   Lower Extremity Assessment Lower Extremity Assessment: Generalized weakness  Vision   Additional Comments: decr visual attention to environment when cluttered; ran into chair and door  facing with RW on way out of room. not formally assessed but would suspect decr peripheral vision   Perception     Praxis      Cognition Arousal: Alert Behavior During Therapy: Flat affect Overall Cognitive Status: Impaired/Different from baseline Area of Impairment: Attention, Memory, Safety/judgement, Following commands, Awareness, Problem solving, Orientation                   Current Attention Level: Sustained (for short periods) Memory: Decreased short-term memory, Decreased recall of precautions Following Commands: Follows one step commands with increased time, Follows one step commands inconsistently (~80% of direct simple commands) Safety/Judgement: Decreased awareness of safety, Decreased awareness of deficits Awareness: Intellectual Problem Solving: Slow processing, Decreased initiation, Difficulty sequencing, Requires verbal cues, Requires tactile cues General Comments: poor recall of sternal precautions; direct cues for functional implementation but able to follow commands to maintain this session WFL; some awareness of precautions by end of session holding RW when going to sit with pillow handed to pt and pt stating that was stupid with cues able to state BUE need to be holding pillow/close to body with transfers.        Exercises      Shoulder Instructions       General Comments VSS.    Pertinent Vitals/ Pain       Pain Assessment Pain Assessment: No/denies pain Pain Intervention(s): Monitored during session  Home Living                                          Prior Functioning/Environment              Frequency  Min 1X/week        Progress Toward Goals  OT Goals(current goals can now be found in the care plan section)  Progress towards OT goals: Progressing toward goals  Acute Rehab OT Goals Patient Stated Goal: get better OT Goal Formulation: With patient Time For Goal Achievement: 04/02/23 Potential to Achieve  Goals: Good  Plan      Co-evaluation                 AM-PAC OT 6 Clicks Daily Activity     Outcome Measure   Help from another person eating meals?: A Lot Help from another person taking care of personal grooming?: A Lot Help from another person toileting, which includes using toliet, bedpan, or urinal?: Total Help from another person bathing (including washing, rinsing, drying)?: A Lot Help from another person to put on and taking off regular upper body clothing?: A Lot Help from another person to put on and taking off regular lower body clothing?: Total 6 Click Score: 10    End of Session Equipment Utilized During Treatment: Gait belt;Rolling walker (2 wheels)  OT Visit Diagnosis: Unsteadiness on feet (R26.81);Muscle weakness (generalized) (M62.81);Other symptoms and signs involving cognitive function   Activity Tolerance Patient tolerated treatment well   Patient Left in chair;with call bell/phone within reach;with chair alarm set   Nurse Communication Mobility status        Time: 8594-8562 OT Time Calculation (min): 32 min  Charges: OT General Charges $OT Visit: 1 Visit OT Treatments $Self Care/Home Management : 23-37 mins  Elma JONETTA Lebron FREDERICK, OTR/L [facility] Acute Rehabilitation Office: [phone]  Schneider Warchol D Walton 03/19/2023, 2:51 PM

## 2023-03-19 NOTE — Progress Notes (Signed)
 Speech Language Pathology Treatment: Dysphagia  Patient Details Name: Walter Vargas MRN: [id number] DOB: 1946-02-05 Today's Date: 03/19/2023 Time: 9057-8998 SLP Time Calculation (min) (ACUTE ONLY): 19 min  Assessment / Plan / Recommendation Clinical Impression  Walter Vargas is making progress; transferring out of ICU today. He is not happy with pureed diet/thickened liquids. Continues with cortrak. His swallowing appears subjectively stronger today.  Practiced Masako for base of tongue strengthening and effortful swallows with verbal cues needed for each trial.  Confusion persists.  Family friends at bedside and providing encouragement.   Recommend repeating MBS next date if possible.  Continue pureed/honey-thick liquids and cortrak pending study. D/W pt and RN.    HPI HPI: 78 year old male with chronic HFrEF was initially admitted 11/30 with acute NSTEMI and acute on chronic biventricular HFrEF with moderate to severe MR, underwent CABG x  5 12/19, post-procedure complicated with cardiac stunning and failure to wean from bypass s/p with placement of Impella 5.5. 12/20 back to OR for re-exploration d/t post-op bleeding/ABLA.  Intubated 12/19-20. Cortrak 12/20. Reintubated for procedure only 12/30. Per RN family reports baseline dysphagia. MBS 1/1 with compromised airway protection and efficiency. Dysphagia 1/honey thick liquids recommended.      SLP Plan  MBS;Continue with current plan of care      Recommendations for follow up therapy are one component of a multi-disciplinary discharge planning process, led by the attending physician.  Recommendations may be updated based on patient status, additional functional criteria and insurance authorization.    Recommendations  Diet recommendations: Dysphagia 1 (puree);Honey-thick liquid Medication Administration: Crushed with puree Supervision: Staff to assist with self feeding Compensations: Minimize environmental distractions;Slow rate;Small  sips/bites Postural Changes and/or Swallow Maneuvers: Seated upright 90 degrees                  Oral care BID   Frequent or constant Supervision/Assistance Dysphagia, oropharyngeal phase (R13.12)     MBS;Continue with current plan of care    Lonzie Simmer L. Vona, MA CCC/SLP Clinical Specialist - Acute Care SLP Acute Rehabilitation Services Office number [phone]  Vona Palma Laurice  03/19/2023, 10:07 AM

## 2023-03-20 ENCOUNTER — Inpatient Hospital Stay (HOSPITAL_COMMUNITY)
Admission: AD | Admit: 2023-03-20 | Discharge: 2023-04-25 | DRG: 945 | Disposition: A | Discharge status: Other Acute Inpatient Hospital | Payer: 59 | Source: Intra-hospital | Attending: Physical Medicine and Rehabilitation | Admitting: Physical Medicine and Rehabilitation

## 2023-03-20 ENCOUNTER — Inpatient Hospital Stay (HOSPITAL_COMMUNITY): Payer: 59

## 2023-03-20 DIAGNOSIS — R059 Cough, unspecified: Secondary | ICD-10-CM | POA: Diagnosis not present

## 2023-03-20 DIAGNOSIS — J690 Pneumonitis due to inhalation of food and vomit: Secondary | ICD-10-CM | POA: Diagnosis present

## 2023-03-20 DIAGNOSIS — L03115 Cellulitis of right lower limb: Secondary | ICD-10-CM | POA: Diagnosis not present

## 2023-03-20 DIAGNOSIS — Z9689 Presence of other specified functional implants: Secondary | ICD-10-CM

## 2023-03-20 DIAGNOSIS — I639 Cerebral infarction, unspecified: Secondary | ICD-10-CM | POA: Diagnosis not present

## 2023-03-20 DIAGNOSIS — Z602 Problems related to living alone: Secondary | ICD-10-CM | POA: Diagnosis present

## 2023-03-20 DIAGNOSIS — J90 Pleural effusion, not elsewhere classified: Secondary | ICD-10-CM | POA: Diagnosis not present

## 2023-03-20 DIAGNOSIS — Z8616 Personal history of COVID-19: Secondary | ICD-10-CM | POA: Diagnosis not present

## 2023-03-20 DIAGNOSIS — L89156 Pressure-induced deep tissue damage of sacral region: Secondary | ICD-10-CM | POA: Diagnosis not present

## 2023-03-20 DIAGNOSIS — I2510 Atherosclerotic heart disease of native coronary artery without angina pectoris: Secondary | ICD-10-CM | POA: Diagnosis present

## 2023-03-20 DIAGNOSIS — L89152 Pressure ulcer of sacral region, stage 2: Secondary | ICD-10-CM | POA: Diagnosis present

## 2023-03-20 DIAGNOSIS — I6522 Occlusion and stenosis of left carotid artery: Secondary | ICD-10-CM | POA: Diagnosis present

## 2023-03-20 DIAGNOSIS — K828 Other specified diseases of gallbladder: Secondary | ICD-10-CM | POA: Diagnosis present

## 2023-03-20 DIAGNOSIS — D62 Acute posthemorrhagic anemia: Secondary | ICD-10-CM | POA: Diagnosis not present

## 2023-03-20 DIAGNOSIS — F0280 Dementia in other diseases classified elsewhere without behavioral disturbance: Secondary | ICD-10-CM | POA: Diagnosis not present

## 2023-03-20 DIAGNOSIS — R5381 Other malaise: Principal | ICD-10-CM | POA: Diagnosis present

## 2023-03-20 DIAGNOSIS — Z681 Body mass index (BMI) 19 or less, adult: Secondary | ICD-10-CM

## 2023-03-20 DIAGNOSIS — R570 Cardiogenic shock: Secondary | ICD-10-CM | POA: Diagnosis present

## 2023-03-20 DIAGNOSIS — R4189 Other symptoms and signs involving cognitive functions and awareness: Secondary | ICD-10-CM | POA: Diagnosis not present

## 2023-03-20 DIAGNOSIS — I081 Rheumatic disorders of both mitral and tricuspid valves: Secondary | ICD-10-CM | POA: Diagnosis present

## 2023-03-20 DIAGNOSIS — Z48812 Encounter for surgical aftercare following surgery on the circulatory system: Secondary | ICD-10-CM | POA: Diagnosis not present

## 2023-03-20 DIAGNOSIS — Z0489 Encounter for examination and observation for other specified reasons: Secondary | ICD-10-CM | POA: Diagnosis not present

## 2023-03-20 DIAGNOSIS — I5042 Chronic combined systolic (congestive) and diastolic (congestive) heart failure: Secondary | ICD-10-CM | POA: Diagnosis present

## 2023-03-20 DIAGNOSIS — Z7984 Long term (current) use of oral hypoglycemic drugs: Secondary | ICD-10-CM

## 2023-03-20 DIAGNOSIS — R1310 Dysphagia, unspecified: Secondary | ICD-10-CM | POA: Diagnosis present

## 2023-03-20 DIAGNOSIS — Z9889 Other specified postprocedural states: Secondary | ICD-10-CM

## 2023-03-20 DIAGNOSIS — R413 Other amnesia: Secondary | ICD-10-CM | POA: Diagnosis not present

## 2023-03-20 DIAGNOSIS — S51012A Laceration without foreign body of left elbow, initial encounter: Secondary | ICD-10-CM | POA: Diagnosis not present

## 2023-03-20 DIAGNOSIS — I110 Hypertensive heart disease with heart failure: Secondary | ICD-10-CM | POA: Diagnosis present

## 2023-03-20 DIAGNOSIS — F02A4 Dementia in other diseases classified elsewhere, mild, with anxiety: Secondary | ICD-10-CM | POA: Diagnosis present

## 2023-03-20 DIAGNOSIS — I6523 Occlusion and stenosis of bilateral carotid arteries: Secondary | ICD-10-CM | POA: Diagnosis not present

## 2023-03-20 DIAGNOSIS — Z811 Family history of alcohol abuse and dependence: Secondary | ICD-10-CM

## 2023-03-20 DIAGNOSIS — I429 Cardiomyopathy, unspecified: Secondary | ICD-10-CM | POA: Diagnosis not present

## 2023-03-20 DIAGNOSIS — G319 Degenerative disease of nervous system, unspecified: Secondary | ICD-10-CM | POA: Diagnosis not present

## 2023-03-20 DIAGNOSIS — I214 Non-ST elevation (NSTEMI) myocardial infarction: Secondary | ICD-10-CM | POA: Diagnosis present

## 2023-03-20 DIAGNOSIS — I63512 Cerebral infarction due to unspecified occlusion or stenosis of left middle cerebral artery: Secondary | ICD-10-CM | POA: Diagnosis not present

## 2023-03-20 DIAGNOSIS — I700 Atherosclerosis of aorta: Secondary | ICD-10-CM | POA: Diagnosis not present

## 2023-03-20 DIAGNOSIS — Z87891 Personal history of nicotine dependence: Secondary | ICD-10-CM

## 2023-03-20 DIAGNOSIS — Z951 Presence of aortocoronary bypass graft: Secondary | ICD-10-CM

## 2023-03-20 DIAGNOSIS — I6521 Occlusion and stenosis of right carotid artery: Secondary | ICD-10-CM | POA: Diagnosis not present

## 2023-03-20 DIAGNOSIS — E875 Hyperkalemia: Secondary | ICD-10-CM | POA: Diagnosis present

## 2023-03-20 DIAGNOSIS — S41111A Laceration without foreign body of right upper arm, initial encounter: Secondary | ICD-10-CM | POA: Diagnosis not present

## 2023-03-20 DIAGNOSIS — I5032 Chronic diastolic (congestive) heart failure: Secondary | ICD-10-CM

## 2023-03-20 DIAGNOSIS — D75839 Thrombocytosis, unspecified: Secondary | ICD-10-CM | POA: Diagnosis present

## 2023-03-20 DIAGNOSIS — I6389 Other cerebral infarction: Secondary | ICD-10-CM | POA: Diagnosis not present

## 2023-03-20 DIAGNOSIS — Z7982 Long term (current) use of aspirin: Secondary | ICD-10-CM

## 2023-03-20 DIAGNOSIS — R197 Diarrhea, unspecified: Secondary | ICD-10-CM | POA: Diagnosis not present

## 2023-03-20 DIAGNOSIS — Z8249 Family history of ischemic heart disease and other diseases of the circulatory system: Secondary | ICD-10-CM

## 2023-03-20 DIAGNOSIS — L8990 Pressure ulcer of unspecified site, unspecified stage: Secondary | ICD-10-CM | POA: Diagnosis not present

## 2023-03-20 DIAGNOSIS — E038 Other specified hypothyroidism: Secondary | ICD-10-CM | POA: Diagnosis present

## 2023-03-20 DIAGNOSIS — R64 Cachexia: Secondary | ICD-10-CM | POA: Diagnosis present

## 2023-03-20 DIAGNOSIS — Z7901 Long term (current) use of anticoagulants: Secondary | ICD-10-CM

## 2023-03-20 DIAGNOSIS — I63412 Cerebral infarction due to embolism of left middle cerebral artery: Secondary | ICD-10-CM | POA: Diagnosis not present

## 2023-03-20 DIAGNOSIS — I9581 Postprocedural hypotension: Secondary | ICD-10-CM | POA: Diagnosis not present

## 2023-03-20 DIAGNOSIS — R002 Palpitations: Secondary | ICD-10-CM | POA: Diagnosis present

## 2023-03-20 DIAGNOSIS — Z79899 Other long term (current) drug therapy: Secondary | ICD-10-CM

## 2023-03-20 DIAGNOSIS — I7300 Raynaud's syndrome without gangrene: Secondary | ICD-10-CM | POA: Diagnosis present

## 2023-03-20 DIAGNOSIS — K5901 Slow transit constipation: Secondary | ICD-10-CM | POA: Diagnosis not present

## 2023-03-20 DIAGNOSIS — E43 Unspecified severe protein-calorie malnutrition: Secondary | ICD-10-CM | POA: Diagnosis not present

## 2023-03-20 DIAGNOSIS — F05 Delirium due to known physiological condition: Secondary | ICD-10-CM | POA: Diagnosis not present

## 2023-03-20 DIAGNOSIS — Z6820 Body mass index (BMI) 20.0-20.9, adult: Secondary | ICD-10-CM

## 2023-03-20 DIAGNOSIS — R419 Unspecified symptoms and signs involving cognitive functions and awareness: Secondary | ICD-10-CM | POA: Diagnosis not present

## 2023-03-20 DIAGNOSIS — E785 Hyperlipidemia, unspecified: Secondary | ICD-10-CM | POA: Diagnosis present

## 2023-03-20 DIAGNOSIS — N179 Acute kidney failure, unspecified: Secondary | ICD-10-CM | POA: Diagnosis not present

## 2023-03-20 DIAGNOSIS — R21 Rash and other nonspecific skin eruption: Secondary | ICD-10-CM | POA: Diagnosis present

## 2023-03-20 DIAGNOSIS — E871 Hypo-osmolality and hyponatremia: Secondary | ICD-10-CM | POA: Diagnosis present

## 2023-03-20 DIAGNOSIS — R32 Unspecified urinary incontinence: Secondary | ICD-10-CM | POA: Diagnosis not present

## 2023-03-20 DIAGNOSIS — R001 Bradycardia, unspecified: Secondary | ICD-10-CM | POA: Diagnosis present

## 2023-03-20 DIAGNOSIS — I252 Old myocardial infarction: Secondary | ICD-10-CM

## 2023-03-20 DIAGNOSIS — K5900 Constipation, unspecified: Secondary | ICD-10-CM | POA: Diagnosis present

## 2023-03-20 DIAGNOSIS — Z813 Family history of other psychoactive substance abuse and dependence: Secondary | ICD-10-CM

## 2023-03-20 DIAGNOSIS — Z833 Family history of diabetes mellitus: Secondary | ICD-10-CM

## 2023-03-20 DIAGNOSIS — R0902 Hypoxemia: Secondary | ICD-10-CM | POA: Diagnosis not present

## 2023-03-20 LAB — RENAL FUNCTION PANEL
Albumin: 2.4 g/dL — ABNORMAL LOW (ref 3.5–5.0)
Anion gap: 9 (ref 5–15)
BUN: 37 mg/dL — ABNORMAL HIGH (ref 8–23)
CO2: 23 mmol/L (ref 22–32)
Calcium: 8.4 mg/dL — ABNORMAL LOW (ref 8.9–10.3)
Chloride: 99 mmol/L (ref 98–111)
Creatinine, Ser: 0.79 mg/dL (ref 0.61–1.24)
GFR, Estimated: >60 mL/min (ref >60)
Glucose, Bld: 128 mg/dL — ABNORMAL HIGH (ref 70–99)
Phosphorus: 3.8 mg/dL (ref 2.5–4.6)
Potassium: 4.6 mmol/L (ref 3.5–5.1)
Sodium: 131 mmol/L — ABNORMAL LOW (ref 135–145)

## 2023-03-20 LAB — COOXEMETRY PANEL
Carboxyhemoglobin: 2.9 % — ABNORMAL HIGH (ref 0.5–1.5)
Methemoglobin: <0.7 % (ref 0.0–1.5)
O2 Saturation: 62.6 %
Total hemoglobin: 9.9 g/dL — ABNORMAL LOW (ref 12.0–16.0)

## 2023-03-20 LAB — GLUCOSE, CAPILLARY
Glucose-Capillary: 149 mg/dL — ABNORMAL HIGH (ref 70–99)
Glucose-Capillary: 120 mg/dL — ABNORMAL HIGH (ref 70–99)

## 2023-03-20 MED ORDER — ACETAMINOPHEN 160 MG/5ML PO SOLN: 650.0000 mg | ORAL | Status: DC | PRN

## 2023-03-20 MED ORDER — AMIODARONE HCL 200 MG PO TABS
200.0000 mg | ORAL_TABLET | Freq: Two times a day (BID) | ORAL | Status: AC
  Administered 2023-03-20 – 2023-03-26 (×13): 200 mg via ORAL
  Filled 2023-03-20 (×13): qty 1

## 2023-03-20 MED ORDER — ORAL CARE MOUTH RINSE
15.0000 mL | OROMUCOSAL | Status: DC
  Administered 2023-03-20 – 2023-04-24 (×102): 15 mL via OROMUCOSAL

## 2023-03-20 MED ORDER — LOSARTAN POTASSIUM 25 MG PO TABS
25.0000 mg | ORAL_TABLET | Freq: Every day | ORAL | Status: DC
  Administered 2023-03-21 – 2023-04-11 (×22): 25 mg via ORAL
  Filled 2023-03-20 (×22): qty 1

## 2023-03-20 MED ORDER — ORAL CARE MOUTH RINSE: 15.0000 mL | OROMUCOSAL | Status: DC | PRN

## 2023-03-20 MED ORDER — FUROSEMIDE 10 MG/ML IJ SOLN
40.0000 mg | Freq: Once | INTRAMUSCULAR | Status: AC
  Administered 2023-03-20: 40 mg via INTRAVENOUS
  Filled 2023-03-20: qty 4

## 2023-03-20 MED ORDER — CEFAZOLIN SODIUM-DEXTROSE 2-4 GM/100ML-% IV SOLN
2.0000 g | Freq: Three times a day (TID) | INTRAVENOUS | Status: AC
  Administered 2023-03-20 – 2023-03-21 (×4): 2 g via INTRAVENOUS
  Filled 2023-03-20 (×3): qty 100

## 2023-03-20 MED ORDER — GUAIFENESIN 100 MG/5ML PO LIQD
15.0000 mL | Freq: Four times a day (QID) | ORAL | Status: DC
  Administered 2023-03-20 – 2023-03-30 (×28): 15 mL via ORAL
  Filled 2023-03-20 (×29): qty 15

## 2023-03-20 MED ORDER — ASPIRIN 81 MG PO CHEW
81.0000 mg | CHEWABLE_TABLET | Freq: Every day | ORAL | Status: DC
  Administered 2023-03-21 – 2023-04-24 (×35): 81 mg via ORAL
  Filled 2023-03-20 (×35): qty 1

## 2023-03-20 MED ORDER — QUETIAPINE FUMARATE 50 MG PO TABS: 50.0000 mg | ORAL_TABLET | Freq: Every day | ORAL | Status: DC

## 2023-03-20 MED ORDER — AMOXICILLIN-POT CLAVULANATE 600-42.9 MG/5ML PO SUSR: 600.0000 mg | Freq: Two times a day (BID) | ORAL | Status: DC

## 2023-03-20 MED ORDER — QUETIAPINE FUMARATE 25 MG PO TABS: 50.0000 mg | ORAL_TABLET | Freq: Every day | ORAL | Status: DC

## 2023-03-20 MED ORDER — GERHARDT'S BUTT CREAM: TOPICAL_CREAM | Freq: Every day | CUTANEOUS | Status: DC

## 2023-03-20 MED ORDER — SODIUM CHLORIDE 0.9% FLUSH
10.0000 mL | INTRAVENOUS | Status: DC | PRN
  Administered 2023-03-24: 30 mL

## 2023-03-20 MED ORDER — QUETIAPINE FUMARATE 50 MG PO TABS
50.0000 mg | ORAL_TABLET | Freq: Every day | ORAL | Status: DC
Start: 2023-03-20 — End: 2023-03-27
  Administered 2023-03-20 – 2023-03-26 (×7): 50 mg via ORAL
  Filled 2023-03-20 (×8): qty 1

## 2023-03-20 MED ORDER — ENSURE ENLIVE PO LIQD
237.0000 mL | Freq: Three times a day (TID) | ORAL | Status: DC
  Administered 2023-03-21 – 2023-03-26 (×13): 237 mL via ORAL

## 2023-03-20 MED ORDER — ENOXAPARIN SODIUM 40 MG/0.4ML IJ SOSY: 40.0000 mg | PREFILLED_SYRINGE | INTRAMUSCULAR | Status: DC

## 2023-03-20 MED ORDER — QUETIAPINE FUMARATE 25 MG PO TABS
25.0000 mg | ORAL_TABLET | Freq: Every day | ORAL | Status: DC
Start: 2023-03-21 — End: 2023-03-26
  Administered 2023-03-21 – 2023-03-25 (×5): 25 mg via ORAL
  Filled 2023-03-20 (×5): qty 1

## 2023-03-20 MED ORDER — DIGOXIN 125 MCG PO TABS: 0.1250 mg | ORAL_TABLET | Freq: Every day | ORAL | Status: DC

## 2023-03-20 MED ORDER — LOSARTAN POTASSIUM 25 MG PO TABS: 25.0000 mg | ORAL_TABLET | Freq: Every day | ORAL | Status: DC

## 2023-03-20 MED ORDER — AMIODARONE HCL 200 MG PO TABS: 200.0000 mg | ORAL_TABLET | Freq: Two times a day (BID) | ORAL | Status: DC

## 2023-03-20 MED ORDER — SPIRONOLACTONE 25 MG PO TABS
25.0000 mg | ORAL_TABLET | Freq: Every day | ORAL | Status: DC
  Administered 2023-03-21 – 2023-04-10 (×21): 25 mg via ORAL
  Filled 2023-03-20 (×21): qty 1

## 2023-03-20 MED ORDER — ENSURE ENLIVE PO LIQD: 237.0000 mL | Freq: Three times a day (TID) | ORAL | Status: DC

## 2023-03-20 MED ORDER — ENOXAPARIN SODIUM 40 MG/0.4ML IJ SOSY
40.0000 mg | PREFILLED_SYRINGE | INTRAMUSCULAR | Status: DC
  Administered 2023-03-21 – 2023-03-23 (×3): 40 mg via SUBCUTANEOUS
  Filled 2023-03-20 (×4): qty 0.4

## 2023-03-20 MED ORDER — VITAMIN C 500 MG PO TABS
500.0000 mg | ORAL_TABLET | Freq: Every day | ORAL | Status: AC
  Administered 2023-03-21 – 2023-04-14 (×24): 500 mg via ORAL
  Filled 2023-03-20 (×25): qty 1

## 2023-03-20 MED ORDER — THIAMINE MONONITRATE 100 MG PO TABS
100.0000 mg | ORAL_TABLET | Freq: Every day | ORAL | Status: DC
  Administered 2023-03-21 – 2023-04-20 (×29): 100 mg via ORAL
  Filled 2023-03-20 (×31): qty 1

## 2023-03-20 MED ORDER — SPIRONOLACTONE 25 MG PO TABS: 25.0000 mg | ORAL_TABLET | Freq: Every day | ORAL | Status: DC

## 2023-03-20 MED ORDER — ASCORBIC ACID 500 MG PO TABS: 500.0000 mg | ORAL_TABLET | Freq: Every day | ORAL | Status: DC

## 2023-03-20 MED ORDER — VITAL 1.5 CAL PO LIQD: 660.0000 mL | ORAL | Status: DC

## 2023-03-20 MED ORDER — OXYCODONE HCL 5 MG/5ML PO SOLN
5.0000 mg | ORAL | Status: DC | PRN
  Administered 2023-03-23: 5 mg via ORAL
  Filled 2023-03-20: qty 5

## 2023-03-20 MED ORDER — DIGOXIN 125 MCG PO TABS
0.1250 mg | ORAL_TABLET | Freq: Every day | ORAL | Status: DC
Start: 2023-03-21 — End: 2023-04-25
  Administered 2023-03-21 – 2023-04-24 (×33): 0.125 mg via ORAL
  Filled 2023-03-20 (×35): qty 1

## 2023-03-20 MED ORDER — SALINE SPRAY 0.65 % NA SOLN: 1.0000 | NASAL | Status: DC | PRN

## 2023-03-20 MED ORDER — EMPAGLIFLOZIN 10 MG PO TABS: 10.0000 mg | ORAL_TABLET | Freq: Every day | ORAL | Status: DC

## 2023-03-20 MED ORDER — ORAL CARE MOUTH RINSE: 15.0000 mL | Freq: Four times a day (QID) | OROMUCOSAL | Status: DC

## 2023-03-20 MED ORDER — PROSOURCE TF20 ENFIT COMPATIBL EN LIQD: 60.0000 mL | Freq: Every day | ENTERAL | Status: DC

## 2023-03-20 MED ORDER — QUETIAPINE FUMARATE 25 MG PO TABS: 25.0000 mg | ORAL_TABLET | Freq: Every day | ORAL | Status: DC

## 2023-03-20 MED ORDER — VITAMIN B-1 100 MG PO TABS: 100.0000 mg | ORAL_TABLET | Freq: Every day | ORAL | Status: DC

## 2023-03-20 MED ORDER — ACETAMINOPHEN 160 MG/5ML PO SOLN: 650.0000 mg | ORAL | 0 refills | Status: DC | PRN

## 2023-03-20 MED ORDER — ZINC SULFATE 220 (50 ZN) MG PO CAPS: 220.0000 mg | ORAL_CAPSULE | Freq: Every day | ORAL | Status: DC

## 2023-03-20 MED ORDER — GERHARDT'S BUTT CREAM: 1.0000 | TOPICAL_CREAM | Freq: Every day | CUTANEOUS | Status: DC

## 2023-03-20 MED ORDER — PROSOURCE TF20 ENFIT COMPATIBL EN LIQD
60.0000 mL | Freq: Every day | ENTERAL | Status: DC
  Administered 2023-03-21 – 2023-03-23 (×3): 60 mL
  Filled 2023-03-20 (×3): qty 60

## 2023-03-20 MED ORDER — SODIUM CHLORIDE 0.9% FLUSH
10.0000 mL | Freq: Two times a day (BID) | INTRAVENOUS | Status: DC
  Administered 2023-03-20 – 2023-03-24 (×5): 10 mL
  Administered 2023-03-25: 30 mL

## 2023-03-20 MED ORDER — GUAIFENESIN 100 MG/5ML PO LIQD: 15.0000 mL | Freq: Four times a day (QID) | ORAL | Status: DC

## 2023-03-20 MED ORDER — ZINC SULFATE 220 (50 ZN) MG PO CAPS
220.0000 mg | ORAL_CAPSULE | Freq: Every day | ORAL | Status: AC
  Administered 2023-03-21 – 2023-03-29 (×9): 220 mg via ORAL
  Filled 2023-03-20 (×9): qty 1

## 2023-03-20 MED ORDER — VITAL 1.5 CAL PO LIQD
660.0000 mL | ORAL | Status: DC
  Administered 2023-03-20 – 2023-03-23 (×4): 660 mL
  Filled 2023-03-20: qty 1000
  Filled 2023-03-20 (×3): qty 711
  Filled 2023-03-20: qty 1000

## 2023-03-20 MED ORDER — PANTOPRAZOLE SODIUM 40 MG PO TBEC
40.0000 mg | DELAYED_RELEASE_TABLET | Freq: Every day | ORAL | Status: DC
  Administered 2023-03-21 – 2023-03-27 (×7): 40 mg via ORAL
  Filled 2023-03-20 (×7): qty 1

## 2023-03-20 MED ORDER — SPIRONOLACTONE 12.5 MG HALF TABLET
12.5000 mg | ORAL_TABLET | Freq: Once | ORAL | Status: AC
  Administered 2023-03-20: 12.5 mg via ORAL
  Filled 2023-03-20: qty 1

## 2023-03-20 MED ORDER — DAPAGLIFLOZIN PROPANEDIOL 10 MG PO TABS
10.0000 mg | ORAL_TABLET | Freq: Every day | ORAL | Status: DC
  Administered 2023-03-21 – 2023-04-13 (×24): 10 mg via ORAL
  Filled 2023-03-20 (×24): qty 1

## 2023-03-20 MED ORDER — QUETIAPINE FUMARATE 25 MG PO TABS
25.0000 mg | ORAL_TABLET | Freq: Every day | ORAL | Status: DC
  Administered 2023-03-20: 25 mg
  Filled 2023-03-20: qty 1

## 2023-03-20 MED ORDER — ATORVASTATIN CALCIUM 80 MG PO TABS
80.0000 mg | ORAL_TABLET | Freq: Every day | ORAL | Status: DC
  Administered 2023-03-21 – 2023-04-24 (×35): 80 mg via ORAL
  Filled 2023-03-20 (×35): qty 1

## 2023-03-20 MED ORDER — DOCUSATE SODIUM 50 MG/5ML PO LIQD
200.0000 mg | Freq: Every day | ORAL | Status: DC
  Administered 2023-03-21 – 2023-03-30 (×9): 200 mg via ORAL
  Filled 2023-03-20 (×10): qty 20

## 2023-03-20 MED ORDER — OXYCODONE HCL 5 MG/5ML PO SOLN: 5.0000 mg | ORAL | Status: DC | PRN

## 2023-03-20 MED ORDER — CHLORHEXIDINE GLUCONATE CLOTH 2 % EX PADS
6.0000 | MEDICATED_PAD | Freq: Every day | CUTANEOUS | Status: DC
  Administered 2023-03-21: 6 via TOPICAL

## 2023-03-20 NOTE — Progress Notes (Addendum)
 Patient ID: Walter Vargas, male   DOB: [birth year], 78 y.o.   MRN: [id number]      Advanced Heart Failure Rounding Note  Chief Complaint: Cardiogenic Shock  Subjective:    12/19: S/p CABG and Impella 5.5 Insertion. Coagulopathic on return from OR, high chest tube output, required multiple blood products. Subsequent VT associated w/ Impella suction alarms. Back to OR for re-exploratation, bleeding from EPW.  12/23: Bedside echo severe biventricular dysfunction. LVEF 25% RV moderately down. Impella well positioned 12/25: started on Zosyn  for fevers/suspected aspiration PNA. PCT 0.34  12/30: Impella 5.5 removed.   POD# 32  Family friend, Resa, at bedside. Called her crying this morning.   Getting ready to go for MBS.  Denies dyspnea. Has already worked with PT/OT.  Meds:   amiodarone   200 mg Per Tube BID   ascorbic acid   500 mg Per Tube Daily   aspirin   81 mg Per Tube Daily   atorvastatin   80 mg Per Tube Daily   Chlorhexidine  Gluconate Cloth  6 each Topical Daily   dapagliflozin  propanediol  10 mg Oral Daily   digoxin   0.125 mg Per Tube Daily   docusate  200 mg Per Tube Daily   enoxaparin  (LOVENOX ) injection  40 mg Subcutaneous Q24H   feeding supplement  237 mL Oral TID BM   feeding supplement (PROSource TF20)  60 mL Per Tube Daily   feeding supplement (VITAL 1.5 CAL)  660 mL Per Tube Q24H   furosemide   20 mg Oral Daily   Gerhardt's butt cream   Topical Daily   guaiFENesin   15 mL Per Tube Q6H   insulin  aspart  0-9 Units Subcutaneous TID WC   losartan   25 mg Oral Daily   mouth rinse  15 mL Mouth Rinse 4 times per day   pantoprazole   40 mg Oral Daily   QUEtiapine   25 mg Per Tube Daily   QUEtiapine   50 mg Oral QHS   sodium chloride  flush  10-40 mL Intracatheter Q12H   spironolactone   12.5 mg Oral Daily   thiamine   100 mg Oral Daily   zinc  sulfate (50mg  elemental zinc )  220 mg Per Tube Daily     ceFAZolin  (ANCEF ) IV Stopped (03/20/23 0602)     Objective:   Weight  Range: 62.8 kg Body mass index is 21.05 kg/m.   Vital Signs:   Temp:  [96.2 F (35.7 C)-98.1 F (36.7 C)] 96.2 F (35.7 C) (01/07 0922) Pulse Rate:  [63-72] 63 (01/07 0922) Resp:  [13-26] 25 (01/07 0922) BP: (108-131)/(50-72) 123/71 (01/07 0922) SpO2:  [93 %-100 %] 97 % (01/07 0922) Weight:  [62.8 kg] 62.8 kg (01/07 0316) Last BM Date : 03/20/23  Weight change: Filed Weights   03/18/23 0500 03/19/23 0500 03/20/23 0316  Weight: 57 kg 60 kg 62.8 kg   Intake/Output:   Intake/Output Summary (Last 24 hours) at 03/20/2023 0948 Last data filed at 03/20/2023 0900 Gross per 24 hour  Intake 1893.95 ml  Output 675 ml  Net 1218.95 ml     Physical Exam  General:  Thin, frail appearing elderly male. HEENT: normal Neck: supple. no JVD.  Cor: PMI nondisplaced. Regular rate & rhythm. No rubs, gallops or murmurs. Lungs: clear Abdomen: soft, nontender, nondistended.  Extremities: no cyanosis, clubbing, rash, edema, eschar noted at RLE [facility] sites Neuro: alert & orientedx3. Affect flat   Telemetry   SR 60s-70s, 5 beat run NSVT this am.    Patient Profile   Walter Vargas is  a 78 y.o. history of HTN, Walter, HFrEF, and severe 3V CAD. Admitted with NSTEMI and a/c CHF. S/p CABG and Impella 5.5 Insertion 12/19 w Dr. Kerrin.   Assessment/Plan  1.  Cardiogenic Shock:  Patient has had HF symptoms for at least a couple of months.  Suspect pre-existing ischemic cardiomyopathy then he presented this admission with NSTEMI possible in D2 territory leading to CHF exacerbation. RHC 12/3 showed normal filling pressures and low CI at 2.0. Pre CABG echo EF < 20%, RV moderately reduced (PAPi was adequate on RHC)Intra-op course complicated by cardiac stun and failure to wean from bypass. Impella 5.5 placed. Intra-op TEE EF 20%, RV mildly reduced.   - CO-OX 62% off milrinone  - Volume looks okay on exam but question weight trending up. Set up CVP monitoring. On po lasix  20 daily but may need dose of IV lasix   today - Continue Farxiga  10 mg daily.  - Continue digoxin  0.125; dig level 0.6 1/6 - Increase spiro to 25 mg daily - Continue Losartan  25 mg daily  2. CAD: Patient admitted with NSTEMI (?culprit D2) but with severe extensive diffuse 3 vessel disease.  Disease too extensive to treat percutaneously.  Anatomy felt best served by CABG.  s/p CABG and Impella 5.5 insertion w Dr. Kerrin 12/19. - Continue ASA 81. Add plavix  prior to discharge. - Continue atorva 80  3. Post-Operative Bleeding/ ABLA: Coagulopathic on return from OR. Supported with multiple units of blood products including RBCs, cryo, FFP and Factor VII + albumin  and protamine , had to return to the OR. Bleeding noted around chest tubes.  Now stable. CTs pulled.  Hgb stable in 9s  4. 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  Patient has had on and off confusion in hospital. Worse in evenings/early am.  - Continue Seroquel  bid.   5. ID:   - Completed zosyn  for Suspected Aspiration PNA. Z - Now with possible cellulitis at vein harvest site, started on Ancef  01/01.   6. COVID+: Completed Paxlovid   7. Carotid artery stenosis: Pre-CABG dopplers with 80-99% left ICA stenosis. Seen by [facility] - Planning for outpatient follow-up in 1 month (post CABG) with CTA head and neck.  8. DVT prophylaxis: Enoxaparin .  Right lower leg asymmetric edema => venous dopplers negative for DVT.   9. FEN: nocturnal TFs. Aspiration risk, on dysphagia diet.  Not eating much, does not like dysphagia diet.  - Speech following. Going for MBS  10. H/o VT: Peri-CABG.  - Continue amiodarone  200 mg bid.   Awaiting CIR   ADDENDUM 11:36 AM:   CVP 10-11. Will give 40 mg lasix  IV once.  Kdyn Vonbehren N 03/20/2023 9:48 AM

## 2023-03-20 NOTE — Progress Notes (Signed)
 Inpatient Rehab Admissions Coordinator:   P2P complete and pt has been approved for CIR per Rocky Shad, PA-C.  I met with pt at bedside to update him and he is agreeable to proceed with CIR admit today.  I called pt's son to update him as well.  TOC aware.    Reche Lowers, PT, DPT Admissions Coordinator [phone] 03/20/23  12:45 PM

## 2023-03-20 NOTE — Evaluation (Signed)
 Modified Barium Swallow Study  Patient Details  Name: Walter Vargas MRN: [id number] Date of Birth: 08-01-1945  Today's Date: 03/20/2023  Modified Barium Swallow completed.  Full report located under Chart Review in the Imaging Section.  History of Present Illness 78 year old male with chronic HFrEF was initially admitted 11/30 with acute NSTEMI and acute on chronic biventricular HFrEF with moderate to severe MR, underwent CABG x  5 12/19, post-procedure complicated with cardiac stunning and failure to wean from bypass s/p with placement of Impella 5.5. 12/20 back to OR for re-exploration d/t post-op bleeding/ABLA.  Intubated 12/19-20. Cortrak 12/20. Reintubated for procedure only 12/30. Per RN family reports baseline dysphagia. MBS 1/1 with compromised airway protection and efficiency. Dysphagia 1/honey thick liquids recommended.   Clinical Impression Improved oropharyngeal swallow as compared to study on 03/14/23. Patient alert and cooperative, required cueing to to some continued confusion but able to self feed today and follow directions to complete compensatory strategies. Orally, patient able to transit all boluses provided with some mildly prolonged mastication of regular texture bolus. He continues to have reduced hyolaryngeal movement, laryngeal vestibule closure, pharyngeal squeeze, epiglottic deflection,  and base of tongue retraction but all appearing improved somewhat from previous study. Also note posterior curvative of C spine at C4-5/6 which combined with decreased epiglottic deflection and decreased UES opening is likely impacting full tight closure of laryngeal vestibule during the swallow. The combination of above reuslt in penetration of thin and nectar thick liquids however penetrates remained above the airway and were consistently cleared with either spontaneous dry swallows or cued throat clear and swallow.  Moderate vallecular and pyriform sinus residuals noted post swallows with  solids and nectar thick liquids, reduced to mild-moderate with thin liquids. Presence of esophageal stasis noted below UES tomid esophagus could also be contributing to increased pressure and attributing to decreased UES opening and the presence of pharyngeal residue post swallow. Recommend diet upgrade to dysphagia 2 with thin liquids with strict use of precautions. Did not provide pill today during study based on degree of residue with other boluses, Recommend crushing and providing with a pureed solid. SLP will f/u. Factors that may increase risk of adverse event in presence of aspiration Walter Vargas & Walter Vargas 2021): Poor general health and/or compromised immunity;Reduced cognitive function;Frail or deconditioned;Dependence for feeding and/or oral hygiene;Weak cough;Presence of tubes (ETT, trach, NG, etc.)  Swallow Evaluation Recommendations Recommendations: PO diet PO Diet Recommendation: Dysphagia 2 (Finely chopped);Thin liquids (Level 0) Liquid Administration via: Cup;No straw Medication Administration: Crushed with puree Supervision: Patient able to self-feed;Staff to assist with self-feeding;Full supervision/cueing for swallowing strategies Swallowing strategies  : Slow rate;Small bites/sips;Clear throat intermittently;Multiple dry swallows after each bite/sip Postural changes: Position pt fully upright for meals;Stay upright 30-60 min after meals Oral care recommendations: Oral care BID (2x/day)     Walter Wakefield MA, CCC-SLP  Walter Vargas Walter Vargas 03/20/2023,11:28 AM

## 2023-03-20 NOTE — Progress Notes (Addendum)
      [address]       [place] [id number]             [phone]      8 Days Post-Op Procedure(s) (LRB): REMOVAL OF IMPELLA LEFT VENTRICULAR ASSIST DEVICE (N/A) TRANSESOPHAGEAL ECHOCARDIOGRAM (TEE) (N/A)  Subjective:  Patient not doing well today.  States he did not sleep well last night.  Doesn't like the feeding tube in place.  Doesn't want to go to a facility wants to get home.  Talked to patient and his friend about importance of continued rehabilitation and increasing oral intake so that the patient can get strong enough to go back home.  Objective: Vital signs in last 24 hours: Temp:  [97.6 F (36.4 C)-98.1 F (36.7 C)] 97.6 F (36.4 C) (01/07 0316) Pulse Rate:  [64-78] 72 (01/07 0316) Cardiac Rhythm: Ventricular tachycardia (01/07 0724) Resp:  [13-26] 19 (01/07 0316) BP: (108-146)/(50-72) 124/68 (01/07 0316) SpO2:  [82 %-100 %] 93 % (01/07 0316) Weight:  [62.8 kg] 62.8 kg (01/07 0316)  Hemodynamic parameters for last 24 hours: CVP:  [2 mmHg-13 mmHg] 13 mmHg  Intake/Output from previous day: 01/06 0701 - 01/07 0700 In: 1808.1 [P.O.:240; I.V.:36; NG/GT:1237.8; IV Piggyback:294.2] Out: 375 [Urine:375]  General appearance: alert, cooperative, and no distress Heart: regular rate and rhythm Lungs: clear to auscultation bilaterally Abdomen: soft, non-tender; bowel sounds normal; no masses,  no organomegaly Extremities: edema RLE cellulitis, eschar present along EVH sites, mild erythema Wound: sternotomy well healed  Lab Results: Recent Labs    03/18/23 1054 03/19/23 0502  WBC 9.2 7.9  HGB 9.8* 9.5*  HCT 31.5* 30.4*  PLT 745* 666*   BMET:  Recent Labs    03/19/23 0502 03/20/23 0330  NA 129* 131*  K 4.6 4.6  CL 99 99  CO2 22 23  GLUCOSE 135* 128*  BUN 38* 37*  CREATININE 0.70 0.79  CALCIUM  8.1* 8.4*    PT/INR: No results for input(s): LABPROT, INR in the last 72 hours. ABG    Component Value Date/Time   PHART 7.498 (H)  03/12/2023 1310   HCO3 24.4 03/12/2023 1310   TCO2 25 03/12/2023 1310   ACIDBASEDEF 1.0 03/02/2023 1552   O2SAT 62.6 03/20/2023 0330   CBG (last 3)  Recent Labs    03/19/23 1622 03/19/23 2112 03/20/23 0614  GLUCAP 85 92 149*    Assessment/Plan: S/P Procedure(s) (LRB): REMOVAL OF IMPELLA LEFT VENTRICULAR ASSIST DEVICE (N/A) TRANSESOPHAGEAL ECHOCARDIOGRAM (TEE) (N/A)  CV- Sinus Bradycardia- continue Digoxin , Amiodarone , Cozaar , Aldactone , Farxiga  Pulm- breathing seems labored this morning. Not on oxygen, lung exam is clear, continue IS Renal- creatinine remains stable, CXR with some mild edema.. on Lasix  20 mg daily, may benefit from IV dose today Malnutrition- poor oral intake, swallowing dysfunction.SABRA SLP following, Cortrak in place with tube feedings nightly.SABRA MBS to be done.. needs to improve oral intake Neuro/Psych- persistent delirium off and on, didn't sleep last night..on Seroquel , per Dr. Kerrin will increase evening dose to 50 mg Deconditioning- PT recommending CIR.SABRA awaiting insurance authorization   LOS: 34 days   Rocky Shad, PA-C 03/20/2023  Patient seen and examined, agree with findings and plan outlined above Delirium persists this AM, did not sleep last night CXR actually looks a little better this morning but is still using accessory muscles of breathing Co-ox 63  Tyauna Lacaze C. Kerrin, MD [facility] [phone]

## 2023-03-20 NOTE — Progress Notes (Signed)
 Physical Therapy Treatment Patient Details Name: Walter Vargas MRN: [id number] DOB: September 06, 1945 Today's Date: 03/20/2023   History of Present Illness 78 y.o. male admitted 02/13/22 with NSTEMI, CHF exacerbation. LHC with 3-vessel CAD. 12/9 pt with fall in room. CABG delayed due to (+) COVID. S/p CABG x5, TEE, and placement of impella and swan-ganz 12/19. Coagulopathic on return from OR, high chest tube output, required multiple blood products. 12/20 back to OR for re-exploratation, bleeding from EPW. ETT 12/19-12/20. Chest tubes 12/19-12/21. Cortrak placed 12/20. 12/25 suspected aspiration PNA. 12/30 impella removed, swan placed; swan removed 12/31. Transfer out of ICU 1/6. Course further complicated by delirum. PMH includes HTN, HF, mod/severe MR.   PT Comments  Pt progressing with mobility. Today's session focused on transfer training, standing tolerance and maintaining sternal precautions with activity; pt requiring min-maxA with RW. Pt with increased anxiety (?) and impaired cognition, including decreased attention, difficulty command following and poor awareness, limiting activity progression; pt seems to have little to no recall of education from prior sessions. Pt remains limited by generalized weakness, decreased activity tolerance, and impaired balance strategies/postural reactions. Continue to recommend intensive post-acute rehab (>3 hrs/day) to maximize functional mobility and independence prior to return home.     If plan is discharge home, recommend the following: A lot of help with walking and/or transfers;A lot of help with bathing/dressing/bathroom;Direct supervision/assist for medications management;Direct supervision/assist for financial management;Assistance with cooking/housework;Assist for transportation;Help with stairs or ramp for entrance;Supervision due to cognitive status   Can travel by private vehicle      No  Equipment Recommendations  BSC/3in1    Recommendations for Other  Services       Precautions / Restrictions Precautions Precautions: Fall;Sternal;Other (comment) Precaution Comments: cortrak Restrictions Other Position/Activity Restrictions: sternal precautions     Mobility  Bed Mobility Overal bed mobility: Needs Assistance             General bed mobility comments: pt not following directions for log roll, adamant HOB be elevated, then able to power up to long sitting without UE support, max cues to move BLEs to EOB, ultimately able to do so with CGA for cuing    Transfers Overall transfer level: Needs assistance Equipment used: Rolling walker (2 wheels) Transfers: Sit to/from Stand Sit to Stand: Mod assist, Max assist           General transfer comment: multiple sit<>stands from EOB (2x), recliner (2x) and BSC (1x) to RW, pt requiring repeated, max verbal cues for hand placement and sternal precautions, modA for trunk elevation with momentum to power up, mod-maxA for eccentric lower to sit secondary to significant posterior lean and difficulty following commands for hand placement; repeated cues to widen BOS    Ambulation/Gait Ambulation/Gait assistance: Min assist, Mod assist Gait Distance (Feet): 6 Feet   Gait Pattern/deviations: Step-through pattern, Decreased stride length, Leaning posteriorly, Narrow base of support, Trunk flexed Gait velocity: Decreased     General Gait Details: pt with fluctuating ability to overcome significant posterior lean and sequence steps with RW this session; initial pivotal steps from bed>recliner though pt walking forward with significant posterior lean requiring maxA to safely pivot hips back to bed to prevent fall; additional pivotal steps from bed>recliner>BSC with RW and min-modA for balance; pt then able to walk ~4' forwards/backwards with RW and minA before adamant about return to sitting (I'm done)   Optometrist  Tilt Bed    Modified Rankin  (Stroke Patients Only)       Balance Overall balance assessment: Needs assistance Sitting-balance support: No upper extremity supported, Feet supported Sitting balance-Leahy Scale: Fair     Standing balance support: Bilateral upper extremity supported, Reliant on assistive device for balance Standing balance-Leahy Scale: Poor Standing balance comment: posterior lean, heavy reliance on UE support, dependent for posterior pericare while standing                            Cognition Arousal: Alert Behavior During Therapy: Anxious, Impulsive, Restless Overall Cognitive Status: Impaired/Different from baseline Area of Impairment: Attention, Memory, Safety/judgement, Following commands, Awareness, Problem solving, Orientation                   Current Attention Level: Sustained Memory: Decreased short-term memory, Decreased recall of precautions Following Commands: Follows one step commands with increased time, Follows one step commands inconsistently Safety/Judgement: Decreased awareness of safety, Decreased awareness of deficits Awareness: Intellectual Problem Solving: Slow processing, Decreased initiation, Difficulty sequencing, Requires verbal cues, Requires tactile cues General Comments: little to no recall of sternal precautions; requires direct, simple cues for all mobility. poor attention and awareness, impulsive with movement and poor problem solving. pt and his vistor report he is having a bad morning and friend concerned cognition is worse today.        Exercises      General Comments General comments (skin integrity, edema, etc.): SpO2 96% on RA, DOE up to 3/4 with mobility. pt's family friend present during session      Pertinent Vitals/Pain Pain Assessment Pain Assessment: Faces Faces Pain Scale: Hurts a little bit Pain Location: generalized (does not specify) Pain Descriptors / Indicators: Discomfort, Grimacing Pain Intervention(s): Monitored  during session    Home Living                          Prior Function            PT Goals (current goals can now be found in the care plan section) Acute Rehab PT Goals Patient Stated Goal: to return home PT Goal Formulation: With patient/family Time For Goal Achievement: 04/03/23 Potential to Achieve Goals: Good Progress towards PT goals: Progressing toward goals    Frequency    Min 1X/week      PT Plan      Co-evaluation              AM-PAC PT 6 Clicks Mobility   Outcome Measure  Help needed turning from your back to your side while in a flat bed without using bedrails?: A Little Help needed moving from lying on your back to sitting on the side of a flat bed without using bedrails?: A Lot Help needed moving to and from a bed to a chair (including a wheelchair)?: A Lot Help needed standing up from a chair using your arms (e.g., wheelchair or bedside chair)?: A Lot Help needed to walk in hospital room?: Total Help needed climbing 3-5 steps with a railing? : Total 6 Click Score: 11    End of Session Equipment Utilized During Treatment: Gait belt Activity Tolerance: Patient limited by fatigue;Other (comment) (limited by cognition) Patient left: in chair;with call bell/phone within reach;with chair alarm set;with nursing/sitter in room;with family/visitor present Nurse Communication: Mobility status;Precautions PT Visit Diagnosis: Other abnormalities of gait and mobility (R26.89);Unsteadiness on feet (R26.81);Muscle weakness (generalized) (  M62.81);Difficulty in walking, not elsewhere classified (R26.2)     Time: 9186-9145 PT Time Calculation (min) (ACUTE ONLY): 41 min  Charges:    $Therapeutic Activity: 38-52 mins PT General Charges $$ ACUTE PT VISIT: 1 Visit                     Darice Almas, PT, DPT Acute Rehabilitation Services  Personal: Secure Chat Rehab Office: [phone]  Darice LITTIE Almas 03/20/2023, 9:17 AM

## 2023-03-20 NOTE — TOC Transition Note (Addendum)
 Transition of Care (TOC) - Discharge Note Rayfield Gobble RN, BSN Transitions of Care Unit 4E- RN Case Manager See Treatment Team for direct phone #   Patient Details  Name: Walter Vargas MRN: [id number] Date of Birth: 1946-02-09  Transition of [facility]) CM/SW Contact:  Gobble Rayfield Hurst, RN Phone Number: 03/20/2023, 12:12 PM   Clinical Narrative:    Notified by CIR liaison that pt has insurance approval and bed available for INPT rehab admit today.  Pt has been cleared by attending team stable to transition to [facility] INPT rehab today- pt will transition to available bed later this afternoon.   [facility] liaison notified and will follow for any further HH needs post rehab stay  No further TOC needs noted.    Final next level of care: IP Rehab Facility Barriers to Discharge: Barriers Resolved   Patient Goals and CMS Choice Patient states their goals for this hospitalization and ongoing recovery are:: rehab and recover to return home CMS Medicare.gov Compare Post Acute Care list provided to:: Patient Choice offered to / list presented to : Patient      Discharge Placement               [facility] INPT rehab        Discharge Plan and Services Additional resources added to the After Visit Summary for     Discharge Planning Services: CM Consult Post Acute Care Choice: IP Rehab          DME Arranged: Walker rolling with seat DME Agency: [employer] Date DME Agency Contacted: 02/21/23 Time DME Agency Contacted: [id number] Representative spoke with at DME Agency: [place]   HH Agency: NA        Social Drivers of Health (SDOH) Interventions SDOH Screenings   Food Insecurity: No Food Insecurity (02/14/2023)  Housing: Low Risk  (03/02/2023)  Transportation Needs: No Transportation Needs (02/14/2023)  Utilities: Not At Risk (02/14/2023)  Depression (PHQ2-9): Low Risk  (10/19/2022)  Social Connections: Patient Unable To Answer (03/12/2023)  Tobacco Use: Medium Risk  (02/14/2023)     Readmission Risk Interventions    03/20/2023   12:12 PM  Readmission Risk Prevention Plan  Transportation Screening Complete  Medication Review Oceanographer) Complete  HRI or Home Care Consult Complete  SW Recovery Care/Counseling Consult Complete  Palliative Care Screening Not Applicable  Skilled Nursing Facility Not Applicable

## 2023-03-20 NOTE — PMR Pre-admission (Signed)
 PMR Admission Coordinator Pre-Admission Assessment  Patient: Walter Vargas is an 78 y.o., male MRN: [id number] DOB: 23-May-1945 Height: 5' 8 (172.7 cm) Weight: 62.8 kg  Insurance Information HMO: yes    PPO:      PCP:      IPA:      80/20:      OTHER:  PRIMARY: [employer] Medicare      Policy#: [id number]      Subscriber: pt CM Name: Brittany      Phone#: [phone]     Fax#: [phone] Pre-Cert#: [id number] auth after peer to peer, confirmed by Brittany with H&CC, updates due to fax listed above on  03/26/23     Employer:  Benefits:  Phone #: [phone]     Name:  Eff. Date: 03/14/23     Deduct: $257 ($0 met)      Out of Pocket Max: [phone] ($0 met)      Life Max: n/a CIR: $1610      SNF: 20 full days Outpatient: 80%     Co-Pay: 20% Home Health: 100%      Co-Pay: DME: 80%     Co-Pay: 20% Providers:  SECONDARY:       Policy#:      Phone#:   Artist:       Phone#:   The "Data Collection Information Summary" for patients in Inpatient Rehabilitation Facilities with attached "Privacy Act Statement-Health Care Records" was provided and verbally reviewed with: Patient and Family  Emergency Contact Information Contact Information     Name Relation Home Work Mobile   Eathan, Groman Alta) Son   [phone]   [facility] Sister   [phone]   Jackquline Rue Daughter   [phone]      Other Contacts     Name Relation Home Work Mobile   Aleda Domino Friend   [phone]       Current Medical History  Patient Admitting Diagnosis: cardiac debility   History of Present Illness: Walter Vargas is a 78 year old right-handed male with PMH of HTN, 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  Presented 02/10/2023 with increasing shortness of breath over the last few months as well as chest tightness and abdominal discomfort.  Family had noted some recent AMS.  In the ED, BNP was 4500 high-sensitivity troponin 13,600-13,871.  EKG  revealed sinus rhythm at 96 bpm with LVH and secondary repolarization abnormality.  Cranial CT scan negative for acute changes.  CT of the chest was notable for pulmonary edema and small pleural effusions.  He was given IV Lasix  and IV heparin  and cardiology services consulted.  CT of the abdomen pelvis showed a distended gallbladder but otherwise unremarkable.  A subsequent HIDA scan ruled out cholecystitis.  He had catheterization completed showing left ventricular ejection fraction of less than 20% with global hypokinesis and mild concentric LV hypertrophy.  RV function was moderately reduced.  There was moderate mitral insufficiency and moderate tricuspid insufficiency.  No significant AI or AS.  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  Vascular surgery follow-up as an outpatient recommended. Initial plan for surgical intervention was delayed due to COVID-19 infection, treatment has been completed.  Patient underwent insertion of Impella ventricular assistive device 03/01/2023 per Dr. 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 Kerrin, complicated by postoperative bleeding with reexploration 03/02/2023.  Pt did require MV post op. Postoperative chest tubes have since been removed.  Impella removed 12/30.  Hospital course  placed on Lovenox  03/07/2023 for DVT prophylaxis venous doppler negative.  Acute blood loss anemia 9.5 after transfusion receiving 4 units FFP 2 units cryo and 2 platelets and monitored.  Initially placed on Zosyn  12/25 for low-grade fever suspect aspiration pneumonia.  Develop some cellulitis from right vein harvest site after CABG, completing course of Ancef .  Bouts of agitation and restlessness placed on Seroquel .  He is currently on dysphagia #2 thin liquid diet.  Therapy evaluations were completed and pt was recommended for a comprehensive rehab program.     Patient's medical record from [facility] has been reviewed by the  rehabilitation admission coordinator and physician.  Past Medical History  Past Medical History:  Diagnosis Date   Essential hypertension    HFrEF (heart failure with reduced ejection fraction) (HCC)    Mitral regurgitation     Has the patient had major surgery during 100 days prior to admission? Yes  Family History   family history includes Alcohol abuse in his brother and father; Diabetes in his mother; Drug abuse in his brother; Heart disease in his brother; Hypertension in his father.  Current Medications  Current Facility-Administered Medications:    0.9 %  sodium chloride  infusion (Manually program via Guardrails IV Fluids), , Intra-arterial, PRN, Hendrickson, Steven C, MD   acetaminophen  (TYLENOL ) 160 MG/5ML solution 650 mg, 650 mg, Oral, Q4H PRN, Hendrickson, Steven C, MD, 650 mg at 03/12/23 1806   amiodarone  (PACERONE ) tablet 200 mg, 200 mg, Per Tube, BID, Bitonti, Michael T, RPH, 200 mg at 03/20/23 9143   ascorbic acid  (VITAMIN C ) tablet 500 mg, 500 mg, Per Tube, Daily, Kerrin Elspeth BROCKS, MD, 500 mg at 03/20/23 9143   aspirin  chewable tablet 81 mg, 81 mg, Per Tube, Daily, Bitonti, Michael T, RPH, 81 mg at 03/20/23 9144   atorvastatin  (LIPITOR ) tablet 80 mg, 80 mg, Per Tube, Daily, Bitonti, Michael T, RPH, 80 mg at 03/20/23 9143   ceFAZolin  (ANCEF ) IVPB 2g/100 mL premix, 2 g, Intravenous, Q8H, Kerrin Elspeth BROCKS, MD, Stopped at 03/20/23 0602   Chlorhexidine  Gluconate Cloth 2 % PADS 6 each, 6 each, Topical, Daily, Kerrin Elspeth BROCKS, MD, 6 each at 03/20/23 0857   dapagliflozin  propanediol (FARXIGA ) tablet 10 mg, 10 mg, Oral, Daily, McLean, Dalton S, MD, 10 mg at 03/20/23 9143   digoxin  (LANOXIN ) tablet 0.125 mg, 0.125 mg, Per Tube, Daily, Bitonti, Michael T, RPH, 0.125 mg at 03/20/23 9143   diphenhydrAMINE  (BENADRYL ) 12.5 MG/5ML elixir 25 mg, 25 mg, Oral, QHS PRN, Hendrickson, Steven C, MD, 25 mg at 03/19/23 2311   docusate (COLACE) 50 MG/5ML liquid 200 mg, 200 mg, Per  Tube, Daily, Bitonti, Michael T, RPH, 200 mg at 03/20/23 0855   enoxaparin  (LOVENOX ) injection 40 mg, 40 mg, Subcutaneous, Q24H, Kerrin Elspeth BROCKS, MD, 40 mg at 03/20/23 0856   feeding supplement (ENSURE ENLIVE / ENSURE PLUS) liquid 237 mL, 237 mL, Oral, TID BM, Vantrigt, Peter, MD, 237 mL at 03/20/23 0857   feeding supplement (PROSource TF20) liquid 60 mL, 60 mL, Per Tube, Daily, Kerrin Elspeth BROCKS, MD, 60 mL at 03/20/23 0856   feeding supplement (VITAL 1.5 CAL) liquid 660 mL, 660 mL, Per Tube, Q24H, Kerrin Elspeth BROCKS, MD, Infusion Verify at 03/20/23 [phone]   furosemide  (LASIX ) tablet 20 mg, 20 mg, Oral, Daily, McLean, Dalton S, MD, 20 mg at 03/20/23 9143   Gerhardt's butt cream, , Topical, Daily, Kerrin Elspeth BROCKS, MD, Given at 03/20/23 0856   guaiFENesin  (ROBITUSSIN) 100 MG/5ML liquid 15 mL, 15  mL, Per Tube, Q6H, Cyndy Ozell DASEN, RPH, 15 mL at 03/20/23 0856   insulin  aspart (novoLOG ) injection 0-9 Units, 0-9 Units, Subcutaneous, TID WC, Hendrickson, Steven C, MD, 1 Units at 03/20/23 9348   losartan  (COZAAR ) tablet 25 mg, 25 mg, Oral, Daily, Sabharwal, Aditya, DO, 25 mg at 03/20/23 0856   ondansetron  (ZOFRAN ) injection 4 mg, 4 mg, Intravenous, Q6H PRN, Hendrickson, Steven C, MD   Oral care mouth rinse, 15 mL, Mouth Rinse, 4 times per day, Kerrin Elspeth BROCKS, MD, 15 mL at 03/20/23 0857   Oral care mouth rinse, 15 mL, Mouth Rinse, PRN, Kerrin Elspeth BROCKS, MD   oxyCODONE  (ROXICODONE ) 5 MG/5ML solution 5 mg, 5 mg, Oral, Q4H PRN, Hendrickson, Steven C, MD   pantoprazole  (PROTONIX ) EC tablet 40 mg, 40 mg, Oral, Daily, Diaz, Alma L, NP, 40 mg at 03/20/23 0856   QUEtiapine  (SEROQUEL ) tablet 25 mg, 25 mg, Per Tube, Daily, Barrett, Erin R, PA-C, 25 mg at 03/20/23 9143   QUEtiapine  (SEROQUEL ) tablet 50 mg, 50 mg, Oral, QHS, Barrett, Erin R, PA-C   sodium chloride  (OCEAN) 0.65 % nasal spray 1 spray, 1 spray, Each Nare, PRN, Kerrin Elspeth BROCKS, MD   sodium chloride  flush (NS) 0.9 %  injection 10-40 mL, 10-40 mL, Intracatheter, Q12H, Kerrin Elspeth BROCKS, MD, 10 mL at 03/20/23 0857   sodium chloride  flush (NS) 0.9 % injection 10-40 mL, 10-40 mL, Intracatheter, PRN, Kerrin Elspeth BROCKS, MD   [START ON 03/21/2023] spironolactone  (ALDACTONE ) tablet 25 mg, 25 mg, Oral, Daily, Colletta Manuelita Garre, PA-C   [EXPIRED] thiamine  (VITAMIN B1) 500 mg in sodium chloride  0.9 % 50 mL IVPB, 500 mg, Intravenous, Q8H, Stopped at 03/13/23 2219 **FOLLOWED BY** thiamine  (VITAMIN B1) tablet 100 mg, 100 mg, Oral, Daily, Kerrin Elspeth BROCKS, MD, 100 mg at 03/20/23 9143   zinc  sulfate (50mg  elemental zinc ) capsule 220 mg, 220 mg, Per Tube, Daily, Kerrin Elspeth BROCKS, MD, 220 mg at 03/20/23 9144  Patients Current Diet:  Diet Order             DIET DYS 2 Room service appropriate? Yes; Fluid consistency: Thin  Diet effective now                   Precautions / Restrictions Precautions Precautions: Fall, Sternal, Other (comment) Precaution Booklet Issued: No Precaution Comments: cortrak Restrictions Weight Bearing Restrictions Per Provider Order: Yes RUE Weight Bearing Per Provider Order: Non weight bearing LUE Weight Bearing Per Provider Order: Non weight bearing Other Position/Activity Restrictions: sternal precautions   Has the patient had 2 or more falls or a fall with injury in the past year? No  Prior Activity Level Community (5-7x/wk): independent prior to admit without DME, driving  Prior Functional Level Self Care: Did the patient need help bathing, dressing, using the toilet or eating? Independent  Indoor Mobility: Did the patient need assistance with walking from room to room (with or without device)? Independent  Stairs: Did the patient need assistance with internal or external stairs (with or without device)? Independent  Functional Cognition: Did the patient need help planning regular tasks such as shopping or remembering to take medications?  Independent  Patient Information Are you of Hispanic, Latino/a,or Spanish origin?: A. No, not of Hispanic, Latino/a, or Spanish origin What is your race?: A. White Do you need or want an interpreter to communicate with a doctor or health care staff?: 0. No  Patient's Response To:  Health Literacy and Transportation Is the patient able to respond to  health literacy and transportation needs?: Yes Health Literacy - How often do you need to have someone help you when you read instructions, pamphlets, or other written material from your doctor or pharmacy?: Never In the past 12 months, has lack of transportation kept you from medical appointments or from getting medications?: No In the past 12 months, has lack of transportation kept you from meetings, work, or from getting things needed for daily living?: No  Home Assistive Devices / Equipment Home Equipment: None  Prior Device Use: Indicate devices/aids used by the patient prior to current illness, exacerbation or injury? None of the above  Current Functional Level Cognition  Overall Cognitive Status: Impaired/Different from baseline Current Attention Level: Sustained Orientation Level: Oriented to person, Oriented to place, Disoriented to time, Oriented to situation Following Commands: Follows one step commands with increased time, Follows one step commands inconsistently Safety/Judgement: Decreased awareness of safety, Decreased awareness of deficits General Comments: little to no recall of sternal precautions; requires direct, simple cues for all mobility. poor attention and awareness, impulsive with movement and poor problem solving. pt and his vistor report he is having a bad morning and friend concerned cognition is worse today.    Extremity Assessment (includes Sensation/Coordination)  Upper Extremity Assessment: Generalized weakness  Lower Extremity Assessment: Generalized weakness    ADLs  Overall ADL's : Needs  assistance/impaired Grooming: Oral care, Sitting, Set up Grooming Details (indicate cue type and reason): using suction toothbrush Lower Body Dressing: Moderate assistance, Sitting/lateral leans Lower Body Dressing Details (indicate cue type and reason): socks Toilet Transfer: Minimal assistance, Stand-pivot, Rolling walker (2 wheels) Toilet Transfer Details (indicate cue type and reason): Poor command following throughout transfer and signifciant retropulsion in standing requiring constant mod A Toileting- Clothing Manipulation and Hygiene: Total assistance, +2 for physical assistance, +2 for safety/equipment Toileting - Clothing Manipulation Details (indicate cue type and reason): posterior pericare provided by nursing staff Functional mobility during ADLs: Moderate assistance, Minimal assistance, Rolling walker (2 wheels)    Mobility  Overal bed mobility: Needs Assistance Bed Mobility: Rolling, Sidelying to Sit Rolling: Mod assist Sidelying to sit: Min assist Supine to sit: Mod assist, HOB elevated Sit to sidelying: Mod assist General bed mobility comments: pt not following directions for log roll, adamant HOB be elevated, then able to power up to long sitting without UE support, max cues to move BLEs to EOB, ultimately able to do so with CGA for cuing    Transfers  Overall transfer level: Needs assistance Equipment used: Rolling walker (2 wheels) Transfers: Sit to/from Stand Sit to Stand: Mod assist, Max assist General transfer comment: multiple sit<>stands from EOB (2x), recliner (2x) and BSC (1x) to RW, pt requiring repeated, max verbal cues for hand placement and sternal precautions, modA for trunk elevation with momentum to power up, mod-maxA for eccentric lower to sit secondary to significant posterior lean and difficulty following commands for hand placement; repeated cues to widen BOS    Ambulation / Gait / Stairs / Wheelchair Mobility  Ambulation/Gait Ambulation/Gait  assistance: Min assist, Mod assist Gait Distance (Feet): 6 Feet Assistive device: Rolling walker (2 wheels) Gait Pattern/deviations: Step-through pattern, Decreased stride length, Leaning posteriorly, Narrow base of support, Trunk flexed General Gait Details: pt with fluctuating ability to overcome significant posterior lean and sequence steps with RW this session; initial pivotal steps from bed>recliner though pt walking forward with significant posterior lean requiring maxA to safely pivot hips back to bed to prevent fall; additional pivotal steps from bed>recliner>BSC with  RW and min-modA for balance; pt then able to walk ~4' forwards/backwards with RW and minA before adamant about return to sitting (I'm done) Gait velocity: Decreased Gait velocity interpretation: 1.31 - 2.62 ft/sec, indicative of limited community ambulator    Posture / Balance Dynamic Sitting Balance Sitting balance - Comments: able to sit EOB and recliner Balance Overall balance assessment: Needs assistance Sitting-balance support: No upper extremity supported, Feet supported Sitting balance-Leahy Scale: Fair Sitting balance - Comments: able to sit EOB and recliner Postural control: Posterior lean Standing balance support: Bilateral upper extremity supported, Reliant on assistive device for balance Standing balance-Leahy Scale: Poor Standing balance comment: posterior lean, heavy reliance on UE support, dependent for posterior pericare while standing    Special needs/care consideration Skin sternal incision and Diabetic management yes   Previous Home Environment (from acute therapy documentation) Living Arrangements: Alone Available Help at Discharge: Family, Available 24 hours/day Type of Home: Apartment Home Layout: One level Home Access: Stairs to enter Entrance Stairs-Rails: Right Entrance Stairs-Number of Steps: 3 Home Care Services: No Additional Comments: Per chart review, lives alone; sister and  brother-in-law (in good shape) live in [place], state they're retired and plan to stay with pt for initial 24/7 assist at d/c  Discharge Living Setting Plans for Discharge Living Setting: Patient's home, Lives with (comment) (family to come stay with him) Type of Home at Discharge: Apartment Discharge Home Layout: One level Discharge Home Access: Level entry Discharge Bathroom Shower/Tub: Tub/shower unit Discharge Bathroom Toilet: Standard Discharge Bathroom Accessibility: Yes How Accessible: Accessible via walker Does the patient have any problems obtaining your medications?: No  Social/Family/Support Systems Anticipated Caregiver: son, [name], and sister/BIL (Steffi), and dtr Denson) Anticipated Caregiver's Contact Information: [name] is primary contact [phone] Ability/Limitations of Caregiver: none stated Caregiver Availability: 24/7 Discharge Plan Discussed with Primary Caregiver: Yes Is Caregiver In Agreement with Plan?: Yes Does Caregiver/Family have Issues with Lodging/Transportation while Pt is in Rehab?: No  Goals Patient/Family Goal for Rehab: PT/OT/SLP supervision to mod I Expected length of stay: 14-18 days Additional Information: Discharge plan: family will rotate staying with the patient so he has 24/7 supervision at discharge.  Plan to d/c home, not to a facility. Pt/Family Agrees to Admission and willing to participate: Yes Program Orientation Provided & Reviewed with Pt/Caregiver Including Roles  & Responsibilities: Yes  Decrease burden of Care through IP rehab admission: n/a  Possible need for SNF placement upon discharge: Not anticipated.  Plan for discharge back to patient's home where family will rotate staying with him to ensure 24/7 coverage.   Patient Condition: I have reviewed medical records from [facility], spoken with CM, and patient and son. I met with patient at the bedside and discussed via phone for inpatient rehabilitation assessment.   Patient will benefit from ongoing PT, OT, and SLP, can actively participate in 3 hours of therapy a day 5 days of the week, and can make measurable gains during the admission.  Patient will also benefit from the coordinated team approach during an Inpatient Acute Rehabilitation admission.  The patient will receive intensive therapy as well as Rehabilitation physician, nursing, social worker, and care management interventions.  Due to bladder management, safety, skin/wound care, disease management, medication administration, pain management, and patient education the patient requires 24 hour a day rehabilitation nursing.  The patient is currently min to mod assist with mobility and basic ADLs.  Discharge setting and therapy post discharge at home with home health is anticipated.  Patient has agreed to  participate in the Acute Inpatient Rehabilitation Program and will admit today.  Preadmission Screen Completed By:  Reche FORBES Lowers, PT, DPT 03/20/2023 11:24 AM ______________________________________________________________________   Discussed status with Dr. Zarayah Lanting on 03/20/23  at 11:39 AM  and received approval for admission today.  Admission Coordinator:  Caitlin E Warren, PT, DPT time 11:39 AM Pattricia 03/20/23    Assessment/Plan: Diagnosis: Debility due to CABG x5  Does the need for close, 24 hr/day Medical supervision in concert with the patient's rehab needs make it unreasonable for this patient to be served in a less intensive setting? Yes Co-Morbidities requiring supervision/potential complications: asp pneumonia- D2 thin diet for dysphagia- cellulitis- ABLA s/p FFP and plts; delirium Due to bladder management, bowel management, safety, skin/wound care, disease management, medication administration, pain management, and patient education, does the patient require 24 hr/day rehab nursing? Yes Does the patient require coordinated care of a physician, rehab nurse, PT, OT, and SLP to address physical and  functional deficits in the context of the above medical diagnosis(es)? Yes Addressing deficits in the following areas: balance, endurance, locomotion, strength, transferring, bowel/bladder control, bathing, dressing, feeding, grooming, toileting, cognition, and swallowing Can the patient actively participate in an intensive therapy program of at least 3 hrs of therapy 5 days a week? Yes The potential for patient to make measurable gains while on inpatient rehab is good Anticipated functional outcomes upon discharge from inpatient rehab: modified independent and supervision PT, modified independent and supervision OT, modified independent and supervision SLP Estimated rehab length of stay to reach the above functional goals is: 14-18 days Anticipated discharge destination: Home 10. Overall Rehab/Functional Prognosis: good   MD Signature:

## 2023-03-20 NOTE — H&P (Signed)
 Physical Medicine and Rehabilitation Admission H&P   CC: SOB/weakness  HPI: Walter Vargas. Dowe is a 78 year old right-handed male with history significant for hypertension, heart failure/cardiomyopathy with reduced ejection fraction 20%/mitral regurgitation, history of VT, quit smoking 47 years ago.  Per chart review patient lives alone.  1 level home 3 steps to entry.  He has a sister and brother-in-law who plans to provide assistance on discharge.  Presented 02/10/2023 with increasing shortness of breath over the last few months as well as chest tightness as well as abdominal discomfort.  Family had noted some recent altered mental status.  In the ED BNP was 4500 high-sensitivity troponin 13,600-13,871.  EKG revealed sinus rhythm at 96 bpm with LVH and secondary repolarization abnormality.  Cranial CT scan negative for acute changes.  CT of the chest was notable for pulmonary edema and small pleural effusions.  He was given IV Lasix  and IV heparin  and cardiology services consulted.  CT of the abdomen pelvis showed a distended gallbladder but otherwise unremarkable.  A subsequent HIDA scan ruled out cholecystitis.  He had catheterization completed showing left ventricular ejection fraction of less than 20% with global hypokinesis and mild concentric LV hypertrophy.  RV function was moderately reduced.  There was moderate mitral insufficiency and moderate tricuspid insufficiency.  No significant AI or AS.  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  Patient was cleared for surgery and underwent insertion of Impells ventricular assistive device 03/01/2023 per Dr. Cherrie followed by CABG x 5 03/02/2023 per Dr. Kerrin complicated by postoperative bleeding with reexploration 03/02/2023.SABRA   Patient was slowly extubated.  Postoperative chest tubes have since been removed.  Impella removed 12/30.  Hospital course placed on Lovenox  03/07/2023 for DVT prophylaxis venous doppler negative.  Acute blood loss anemia 9.5 after transfusion receiving 4 units FFP 2 units cryo and 2 platelets and monitored.  Initially placed on Zosyn  12/25 for low-grade fever suspect aspiration pneumonia.  Develop some cellulitis from right vein harvest site after CABG completing course of Ancef .  Bouts of agitation and restlessness placed on Seroquel .  He is currently on dysphagia #2 thin liquid diet as well as nasogastric tube for nutritional support.  Wound care nurse follow-up for sacral wound cover with foam dressing.  Due to patient decreased functional mobility was admitted for a comprehensive rehab program.  Pt reports using purewick to void- doesn't remember the name of it, but says it's easier than getting up.  Had MBS- diet upgraded to D2 thin diet- but still has Cortrak.  Pt admits still feels confused, not quite himself (family friend storngly agrees at bedside).  Irritation from R inner leg/esp lower thigh- bothersome.  Large BM today- in [facility] Also L hip bothers him from hernia.     Review of Systems  Constitutional:  Positive for fever and malaise/fatigue.  HENT:  Negative for hearing loss.   Eyes:  Negative for blurred vision and double vision.  Respiratory:  Positive for shortness of breath. Negative for cough and wheezing.   Cardiovascular:  Positive for chest pain, palpitations and leg swelling.  Gastrointestinal:  Positive for abdominal pain and constipation. Negative for nausea and vomiting.  Genitourinary:  Positive for urgency. Negative for dysuria, flank pain and hematuria.  Musculoskeletal:  Positive for joint pain and myalgias.  Skin:  Negative for rash.  Neurological:  Positive for weakness.  Psychiatric/Behavioral:  Positive for memory loss.   All other systems reviewed and are  negative.  Past Medical History:  Diagnosis Date   Essential hypertension    HFrEF (heart failure with reduced ejection fraction) (HCC)    Mitral regurgitation    Past Surgical History:  Procedure Laterality Date   CORONARY ARTERY BYPASS GRAFT N/A 03/01/2023   Procedure: CORONARY ARTERY BYPASS GRAFTING X 5, USING LEFT INTERNAL MAMMARY ARTERY AND ENDOSCOPICALLY HARVESTED RIGHT SAPHENOUS VEIN GRAFT;  Surgeon: Kerrin Elspeth BROCKS, MD;  Location: [facility] OR;  Service: Open Heart Surgery;  Laterality: N/A;   EXPLORATION POST OPERATIVE OPEN HEART N/A 03/01/2023   Procedure: EXPLORATION POST OPERATIVE OPEN HEART;  Surgeon: Kerrin Elspeth BROCKS, MD;  Location: [facility] OR;  Service: Open Heart Surgery;  Laterality: N/A;   NO PAST SURGERIES     PLACEMENT OF IMPELLA LEFT VENTRICULAR ASSIST DEVICE  03/01/2023   Procedure: PLACEMENT OF IMPELLA LEFT VENTRICULAR ASSIST DEVICE;  Surgeon: Kerrin Elspeth BROCKS, MD;  Location: [facility] OR;  Service: Open Heart Surgery;;   REMOVAL OF IMPELLA LEFT VENTRICULAR ASSIST DEVICE N/A 03/12/2023   Procedure: REMOVAL OF IMPELLA LEFT VENTRICULAR ASSIST DEVICE;  Surgeon: Kerrin Elspeth BROCKS, MD;  Location: [facility] OR;  Service: Open Heart Surgery;  Laterality: N/A;   RIGHT/LEFT HEART CATH AND CORONARY ANGIOGRAPHY N/A 02/13/2023   Procedure: RIGHT/LEFT HEART CATH AND CORONARY ANGIOGRAPHY;  Surgeon: Mady Bruckner, MD;  Location: [facility] INVASIVE CV LAB;  Service: Cardiovascular;  Laterality: N/A;   TEE WITHOUT CARDIOVERSION N/A 03/01/2023   Procedure: TRANSESOPHAGEAL ECHOCARDIOGRAM (TEE);  Surgeon: Kerrin Elspeth BROCKS, MD;  Location: [facility] OR;  Service: Open Heart Surgery;  Laterality: N/A;   TEE WITHOUT CARDIOVERSION N/A 03/12/2023   Procedure: TRANSESOPHAGEAL ECHOCARDIOGRAM (TEE);  Surgeon: Kerrin Elspeth BROCKS, MD;  Location: [facility] OR;  Service: Open Heart Surgery;  Laterality: N/A;   Family History  Problem Relation Age of Onset   Diabetes Mother        gestational diabetes    Hypertension Father    Alcohol abuse Father    Heart disease Brother    Alcohol abuse Brother    Drug abuse Brother    Social History:  reports that he quit smoking about 47 years ago. His smoking use included cigarettes. He started smoking about 57 years ago. He has a 5 pack-year smoking history. He has never used smokeless tobacco. He reports that he does not currently use alcohol. He reports that he does not use drugs. Allergies: No Active Allergies Medications Prior to Admission  Medication Sig Dispense Refill   aspirin  EC 81 MG tablet Take 81 mg by mouth daily.     metoprolol  succinate (TOPROL -XL) 25 MG 24 hr tablet Take 25 mg by mouth daily.     Multiple Vitamins-Minerals (MULTIVITAMIN MEN 50+) TABS Take 1 tablet by mouth daily.     atorvastatin  (LIPITOR ) 80 MG tablet Take 1 tablet (80 mg total) by mouth daily. (Patient not taking: Reported on 02/14/2023)     furosemide  (LASIX ) 20 MG tablet Take 20 mg by mouth daily. (Patient not taking: Reported on 02/14/2023)     heparin  25000 UT/250ML infusion Inject 1,250 Units/hr into the vein continuous. (Patient not taking: Reported on 02/14/2023)     spironolactone  (ALDACTONE ) 25 MG tablet Take 0.5 tablets (12.5 mg total) by mouth daily. (Patient not taking: Reported on 02/14/2023)        Home: Home Living Family/patient expects to be discharged to:: Private residence Living Arrangements:  Alone Available Help at Discharge: Family, Available 24 hours/day Type of Home: Apartment Home Access: Stairs to enter Entrance Stairs-Number of Steps: 3 Entrance Stairs-Rails: Right Home Layout: One level Home Equipment: None Additional Comments: Per chart review, lives alone; sister and brother-in-law (in good shape) live in [place], state they're retired and plan to stay with pt for initial 24/7 assist at d/c   Functional History: Prior Function Prior Level of Function : Independent/Modified Independent, Driving Mobility Comments: independent  without DME. drives. enjoys going to [employer] to socialize in the morning ADLs Comments: Per chart independent; pt with limited verbalizations this session  Functional Status:  Mobility: Bed Mobility Overal bed mobility: Needs Assistance Bed Mobility: Rolling, Sidelying to Sit Rolling: Mod assist Sidelying to sit: Min assist Supine to sit: Mod assist, HOB elevated Sit to sidelying: Mod assist General bed mobility comments: pt not following directions for log roll, adamant HOB be elevated, then able to power up to long sitting without UE support, max cues to move BLEs to EOB, ultimately able to do so with CGA for cuing Transfers Overall transfer level: Needs assistance Equipment used: Rolling walker (2 wheels) Transfers: Sit to/from Stand Sit to Stand: Mod assist, Max assist General transfer comment: multiple sit<>stands from EOB (2x), recliner (2x) and BSC (1x) to RW, pt requiring repeated, max verbal cues for hand placement and sternal precautions, modA for trunk elevation with momentum to power up, mod-maxA for eccentric lower to sit secondary to significant posterior lean and difficulty following commands for hand placement; repeated cues to widen BOS Ambulation/Gait Ambulation/Gait assistance: Min assist, Mod assist Gait Distance (Feet): 6 Feet Assistive device: Rolling walker (2 wheels) Gait Pattern/deviations: Step-through pattern, Decreased stride length, Leaning posteriorly, Narrow base of support, Trunk flexed General Gait Details: pt with fluctuating ability to overcome significant posterior lean and sequence steps with RW this session; initial pivotal steps from bed>recliner though pt walking forward with significant posterior lean requiring maxA to safely pivot hips back to bed to prevent fall; additional pivotal steps from bed>recliner>BSC with RW and min-modA for balance; pt then able to walk ~4' forwards/backwards with RW and minA before adamant about return to sitting (I'm  done) Gait velocity: Decreased Gait velocity interpretation: 1.31 - 2.62 ft/sec, indicative of limited community ambulator    ADL: ADL Overall ADL's : Needs assistance/impaired Grooming: Oral care, Sitting, Set up Grooming Details (indicate cue type and reason): using suction toothbrush Lower Body Dressing: Moderate assistance, Sitting/lateral leans Lower Body Dressing Details (indicate cue type and reason): socks Toilet Transfer: Minimal assistance, Stand-pivot, Rolling walker (2 wheels) Toilet Transfer Details (indicate cue type and reason): Poor command following throughout transfer and signifciant retropulsion in standing requiring constant mod A Toileting- Clothing Manipulation and Hygiene: Total assistance, +2 for physical assistance, +2 for safety/equipment Toileting - Clothing Manipulation Details (indicate cue type and reason): posterior pericare provided by nursing staff Functional mobility during ADLs: Moderate assistance, Minimal assistance, Rolling walker (2 wheels)  Cognition: Cognition Overall Cognitive Status: Impaired/Different from baseline Orientation Level: Oriented to person, Oriented to place, Disoriented to time, Oriented to situation Cognition Arousal: Alert Behavior During Therapy: Anxious, Impulsive, Restless Overall Cognitive Status: Impaired/Different from baseline Area of Impairment: Attention, Memory, Safety/judgement, Following commands, Awareness, Problem solving, Orientation Orientation Level: Disoriented to, Time, Situation, Place Current Attention Level: Sustained Memory: Decreased short-term memory, Decreased recall of precautions Following Commands: Follows one step commands with increased time, Follows one step commands inconsistently Safety/Judgement: Decreased awareness of safety, Decreased awareness of deficits Awareness: Intellectual  Problem Solving: Slow processing, Decreased initiation, Difficulty sequencing, Requires verbal cues, Requires  tactile cues General Comments: little to no recall of sternal precautions; requires direct, simple cues for all mobility. poor attention and awareness, impulsive with movement and poor problem solving. pt and his vistor report he is having a bad morning and friend concerned cognition is worse today.  Physical Exam: Blood pressure 123/71, pulse 63, temperature (!) 96.2 F (35.7 C), temperature source Axillary, resp. rate (!) 25, height 5' 8 (1.727 m), weight 62.8 kg, SpO2 97%. Physical Exam Vitals and nursing note reviewed. Exam conducted with a chaperone present.  Constitutional:      Comments: Thin, almost frail appearing man almost supine in bed; awake, alert, but not exactly appropriate; family friend [name] at bedside, NAD  HENT:     Head: Normocephalic and atraumatic.     Comments: On tele Cortrak in place Cranial nerves intact on exam B/L     Right Ear: External ear normal.     Left Ear: External ear normal.     Nose: Nose normal. No congestion.     Mouth/Throat:     Mouth: Mucous membranes are dry.     Pharynx: Oropharynx is clear. No oropharyngeal exudate.  Eyes:     General:        Right eye: No discharge.        Left eye: No discharge.     Extraocular Movements: Extraocular movements intact.  Cardiovascular:     Rate and Rhythm: Normal rate and regular rhythm.     Heart sounds: Normal heart sounds. No murmur heard.    No gallop.     Comments: S/p 5 vessel CABG- incision well healed  Pulmonary:     Effort: Pulmonary effort is normal. No respiratory distress.     Breath sounds: Normal breath sounds. No wheezing, rhonchi or rales.  Abdominal:     General: Abdomen is flat. Bowel sounds are normal. There is no distension.     Palpations: Abdomen is soft.     Tenderness: There is no abdominal tenderness.     Hernia: A hernia is present.  Musculoskeletal:     Cervical back: Neck supple. No tenderness.     Comments: Ue's 5-/5 except deltoids and biceps 4+/5 LE's HF  4+/5; otherwise 5-/5 B/L   Skin:    General: Skin is warm and dry.     Comments: Ecchymoses in B/L UEs IV R forearm looks OK And PICC LU upper arm looks well R inner leg- thigh and calf- a lot of bruising- dark reddish/purple/a little yellow Scabbing from vein harvest Some swelling like large blister? On R inner calf with line drawn around redness   Neurological:     Comments: Patient is alert and makes eye contact with examiner.  Follows commands.  Provides his name and age but very limited medical historian.  He needed some cues for place and situation. Jacquline Jolynn Pack and in hospital due to heart issues'- knew 2025, January ,but no idea day/date Couldn't come up with friend's name at bedside- known 15 years; Couldn't explain more about hospitalization No hoffman's or clonus B/L  Psychiatric:     Comments: Somewhat confused- higher level cognition impaired     Results for orders placed or performed during the hospital encounter of 02/14/23 (from the past 48 hours)  CBC     Status: Abnormal   Collection Time: 03/18/23 10:54 AM  Result Value Ref Range   WBC 9.2 4.0 - 10.5  K/uL   RBC 3.46 (L) 4.22 - 5.81 MIL/uL   Hemoglobin 9.8 (L) 13.0 - 17.0 g/dL   HCT 68.4 (L) 60.9 - 47.9 %   MCV 91.0 80.0 - 100.0 fL   MCH 28.3 26.0 - 34.0 pg   MCHC 31.1 30.0 - 36.0 g/dL   RDW 82.2 (H) 88.4 - 84.4 %   Platelets 745 (H) 150 - 400 K/uL   nRBC 0.0 0.0 - 0.2 %    Comment: Performed at [facility], [address], [place], [place] [address]  Glucose, capillary     Status: None   Collection Time: 03/18/23 11:12 AM  Result Value Ref Range   Glucose-Capillary 85 70 - 99 mg/dL    Comment: Glucose reference range applies only to samples taken after fasting for at least 8 hours.  Glucose, capillary     Status: None   Collection Time: 03/18/23  3:35 PM  Result Value Ref Range   Glucose-Capillary 95 70 - 99 mg/dL    Comment: Glucose reference range applies only to samples taken after  fasting for at least 8 hours.  Glucose, capillary     Status: None   Collection Time: 03/18/23  9:09 PM  Result Value Ref Range   Glucose-Capillary 87 70 - 99 mg/dL    Comment: Glucose reference range applies only to samples taken after fasting for at least 8 hours.  Renal function panel     Status: Abnormal   Collection Time: 03/19/23  5:02 AM  Result Value Ref Range   Sodium 129 (L) 135 - 145 mmol/L   Potassium 4.6 3.5 - 5.1 mmol/L   Chloride 99 98 - 111 mmol/L   CO2 22 22 - 32 mmol/L   Glucose, Bld 135 (H) 70 - 99 mg/dL    Comment: Glucose reference range applies only to samples taken after fasting for at least 8 hours.   BUN 38 (H) 8 - 23 mg/dL   Creatinine, Ser 9.29 0.61 - 1.24 mg/dL   Calcium  8.1 (L) 8.9 - 10.3 mg/dL   Phosphorus 3.4 2.5 - 4.6 mg/dL   Albumin  2.2 (L) 3.5 - 5.0 g/dL   GFR, Estimated >39 >39 mL/min    Comment: (NOTE) Calculated using the CKD-EPI Creatinine Equation (2021)    Anion gap 8 5 - 15    Comment: Performed at [facility], [address], [place], [place] [address]  CBC     Status: Abnormal   Collection Time: 03/19/23  5:02 AM  Result Value Ref Range   WBC 7.9 4.0 - 10.5 K/uL   RBC 3.28 (L) 4.22 - 5.81 MIL/uL   Hemoglobin 9.5 (L) 13.0 - 17.0 g/dL   HCT 69.5 (L) 60.9 - 47.9 %   MCV 92.7 80.0 - 100.0 fL   MCH 29.0 26.0 - 34.0 pg   MCHC 31.3 30.0 - 36.0 g/dL   RDW 82.1 (H) 88.4 - 84.4 %   Platelets 666 (H) 150 - 400 K/uL   nRBC 0.0 0.0 - 0.2 %    Comment: Performed at [facility], [address] [place], [place] [address]  Cooxemetry Panel (carboxy, met, total hgb, O2 sat)     Status: Abnormal   Collection Time: 03/19/23  5:13 AM  Result Value Ref Range   Total hemoglobin 9.8 (L) 12.0 - 16.0 g/dL   O2 Saturation 41.0 %   Carboxyhemoglobin 2.0 (H) 0.5 - 1.5 %   Methemoglobin <0.7 0.0 - 1.5 %  Comment: Performed at [facility], [address], [place], [place] [address]  Glucose, capillary     Status: Abnormal    Collection Time: 03/19/23  6:30 AM  Result Value Ref Range   Glucose-Capillary 137 (H) 70 - 99 mg/dL    Comment: Glucose reference range applies only to samples taken after fasting for at least 8 hours.  Glucose, capillary     Status: None   Collection Time: 03/19/23 11:48 AM  Result Value Ref Range   Glucose-Capillary 86 70 - 99 mg/dL    Comment: Glucose reference range applies only to samples taken after fasting for at least 8 hours.  Cooxemetry Panel (carboxy, met, total hgb, O2 sat)     Status: Abnormal   Collection Time: 03/19/23 12:57 PM  Result Value Ref Range   Total hemoglobin 10.3 (L) 12.0 - 16.0 g/dL   O2 Saturation 42.3 %   Carboxyhemoglobin 2.1 (H) 0.5 - 1.5 %   Methemoglobin 0.9 0.0 - 1.5 %    Comment: Performed at [facility], [address], [place], [place] [address]  Glucose, capillary     Status: None   Collection Time: 03/19/23  4:22 PM  Result Value Ref Range   Glucose-Capillary 85 70 - 99 mg/dL    Comment: Glucose reference range applies only to samples taken after fasting for at least 8 hours.  Glucose, capillary     Status: None   Collection Time: 03/19/23  9:12 PM  Result Value Ref Range   Glucose-Capillary 92 70 - 99 mg/dL    Comment: Glucose reference range applies only to samples taken after fasting for at least 8 hours.  Renal function panel     Status: Abnormal   Collection Time: 03/20/23  3:30 AM  Result Value Ref Range   Sodium 131 (L) 135 - 145 mmol/L   Potassium 4.6 3.5 - 5.1 mmol/L   Chloride 99 98 - 111 mmol/L   CO2 23 22 - 32 mmol/L   Glucose, Bld 128 (H) 70 - 99 mg/dL    Comment: Glucose reference range applies only to samples taken after fasting for at least 8 hours.   BUN 37 (H) 8 - 23 mg/dL   Creatinine, Ser 9.20 0.61 - 1.24 mg/dL   Calcium  8.4 (L) 8.9 - 10.3 mg/dL   Phosphorus 3.8 2.5 - 4.6 mg/dL   Albumin  2.4 (L) 3.5 - 5.0 g/dL   GFR, Estimated >39 >39 mL/min    Comment: (NOTE) Calculated using the CKD-EPI Creatinine  Equation (2021)    Anion gap 9 5 - 15    Comment: Performed at [facility], [address] [place], [place] [address]  Cooxemetry Panel (carboxy, met, total hgb, O2 sat)     Status: Abnormal   Collection Time: 03/20/23  3:30 AM  Result Value Ref Range   Total hemoglobin 9.9 (L) 12.0 - 16.0 g/dL   O2 Saturation 37.3 %   Carboxyhemoglobin 2.9 (H) 0.5 - 1.5 %   Methemoglobin <0.7 0.0 - 1.5 %    Comment: Performed at [facility], [address] [place], [place] [address]  Glucose, capillary     Status: Abnormal   Collection Time: 03/20/23  6:14 AM  Result Value Ref Range   Glucose-Capillary 149 (H) 70 - 99 mg/dL    Comment: Glucose reference range applies only to samples taken after fasting for at least 8 hours.   DG Chest Port 1 View Result Date:  03/20/2023 CLINICAL DATA:  Pleural effusion, cough. EXAM: PORTABLE CHEST 1 VIEW COMPARISON:  March 16, 2023. FINDINGS: Stable cardiomediastinal silhouette. Stable bibasilar atelectasis and effusions, right greater than left. Feeding tube is seen entering stomach. IMPRESSION: Stable bibasilar atelectasis and pleural effusions, right greater than left. Electronically Signed   By: Lynwood Landy Raddle M.D.   On: 03/20/2023 09:41   VAS US  LOWER EXTREMITY ARTERIAL DUPLEX Result Date: 03/19/2023 LOWER EXTREMITY ARTERIAL DUPLEX STUDY Patient Name:  HOUSTON ZAPIEN  Date of Exam:   03/19/2023 Medical Rec #: [id number]      Accession #:    [id number] Date of Birth: [birth year]      Patient Gender: M Patient Age:   56 years Exam Location:  [facility] Procedure:      VAS US  LOWER EXTREMITY ARTERIAL DUPLEX Referring Phys: RIA COMMANDER --------------------------------------------------------------------------------  Indications: Peripheral artery disease (left carotid artery stenosis 80-99%),              and Patient status post CABG and Impella. Some difficulty finding              pulses at times and large errythemous area medial calf near site of               GSV harvest, question cellulitis. High Risk Factors: Hypertension, past history of smoking, prior MI, coronary                    artery disease. Other Factors: Recent Covid 19 infection, Heart failure with reduced EF.  Current ABI: N/A Limitations: Altered mental status, movement, body habitus Comparison Study: No prior study on file Performing Technologist: Alberta Lis RVS  Examination Guidelines: A complete evaluation includes B-mode imaging, spectral Doppler, color Doppler, and power Doppler as needed of all accessible portions of each vessel. Bilateral testing is considered an integral part of a complete examination. Limited examinations for reoccurring indications may be performed as noted.  +----------+--------+-----+--------+-----------+---------------+ RIGHT     PSV cm/sRatioStenosisWaveform   Comments        +----------+--------+-----+--------+-----------+---------------+ CFA Prox  98                   multiphasic                +----------+--------+-----+--------+-----------+---------------+ DFA       42                   multiphasic                +----------+--------+-----+--------+-----------+---------------+ SFA Prox  47                   multiphasic                +----------+--------+-----+--------+-----------+---------------+ SFA Mid   56                   multiphasic                +----------+--------+-----+--------+-----------+---------------+ SFA Distal102                  multiphasic                +----------+--------+-----+--------+-----------+---------------+ POP Prox  43                   multiphasic                +----------+--------+-----+--------+-----------+---------------+ POP Mid   89  multiphasic                +----------+--------+-----+--------+-----------+---------------+ POP Distal82                   monophasic                  +----------+--------+-----+--------+-----------+---------------+ ATA Distal50                   monophasic                 +----------+--------+-----+--------+-----------+---------------+ PTA Prox  40                   monophasic                 +----------+--------+-----+--------+-----------+---------------+ PTA Mid   22                   monophasic collateral flow +----------+--------+-----+--------+-----------+---------------+ PTA Distal17                   monophasic                 +----------+--------+-----+--------+-----------+---------------+ Complex fluid filled area noted from proximal to distal calf near site of GSV harvest and area of errythema, no vascularization noted.  +-----------+--------+-----+--------+--------------+-----------+ LEFT       PSV cm/sRatioStenosisWaveform      Comments    +-----------+--------+-----+--------+--------------+-----------+ CFA Prox   44                   biphasic                  +-----------+--------+-----+--------+--------------+-----------+ DFA        28                   biphasic                  +-----------+--------+-----+--------+--------------+-----------+ SFA Prox   38                   biphasic                  +-----------+--------+-----+--------+--------------+-----------+ SFA Mid    49                   biphasic                  +-----------+--------+-----+--------+--------------+-----------+ SFA Distal 33                   biphasic                  +-----------+--------+-----+--------+--------------+-----------+ POP Prox   33                   biphasic                  +-----------+--------+-----+--------+--------------+-----------+ POP Mid    43                   biphasic                  +-----------+--------+-----+--------+--------------+-----------+ POP Distal 25                   biphasic                   +-----------+--------+-----+--------+--------------+-----------+ TP Trunk   67  biphasic                  +-----------+--------+-----+--------+--------------+-----------+ ATA Prox                occluded                          +-----------+--------+-----+--------+--------------+-----------+ ATA Mid                 occluded                          +-----------+--------+-----+--------+--------------+-----------+ ATA Distal              occluded                          +-----------+--------+-----+--------+--------------+-----------+ PTA Prox                        not visualized            +-----------+--------+-----+--------+--------------+-----------+ PTA Mid    37                   monophasic    collaterals +-----------+--------+-----+--------+--------------+-----------+ PTA Distal 41                   monophasic    collaterals +-----------+--------+-----+--------+--------------+-----------+ PERO Prox                       not visualized            +-----------+--------+-----+--------+--------------+-----------+ PERO Mid                        not visualized            +-----------+--------+-----+--------+--------------+-----------+ PERO Distal15                   monophasic                +-----------+--------+-----+--------+--------------+-----------+  Summary: Right: Diffuse calcific plaque throughout the right lower extremity arterial system. No significant areas of stenosis noted. Collateral flow noted in the mid calf at the PTA. Complex fluid filled area noted from proximal to distal calf near site of GSV harvest and area of errythema, no vascularization noted. Left: Diffuse calcific plaque noted throughout the left lower extremity. No significant areas of stenosis noted. The anterior tibial artery appears occluded. Collateral flow noted mid to distal calf at PTA.  See table(s) above for measurements and observations.  Electronically signed by Lonni Gaskins MD on 03/19/2023 at 1:53:10 PM.    Final       Blood pressure 123/71, pulse 63, temperature (!) 96.2 F (35.7 C), temperature source Axillary, resp. rate (!) 25, height 5' 8 (1.727 m), weight 62.8 kg, SpO2 97%.  Medical Problem List and Plan: 1. 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  Sternal precautions  -patient may  shower if cover PICC< etc  -ELOS/Goals: 14-18 days supervision to mod I 2.  Antithrombotics: -DVT/anticoagulation:  Pharmaceutical: Lovenox .  Venous Doppler studies negative  -antiplatelet therapy: Aspirin  81 mg daily.  Heart failure team considering adding Plavix  3. Pain Management: Oxycodone  as needed 4. Mood/Behavior/Sleep: Provide emotional support  -antipsychotic agents: Seroquel  25 mg daily at 1000 and 50 mg nightly 5. Neuropsych/cognition: This patient is capable of making  decisions on his own behalf. 6. Skin/Wound Care: Routine skin checks. His right lower extremity harvest site completing course of Ancef  for cellulitis 7. Fluids/Electrolytes/Nutrition: Routine in and outs with follow-up chemistries 8.  Acute blood loss anemia.  Follow-up CBC 9.  COVID-positive.  Completed Paxlovid  10.  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  Seen by vascular surgery Dr Norman Serve and plan outpatient follow-up 1 month 11.  History of VT peri-CABG.  Amiodarone  200 mg daily, Lanoxin  0.125 mg daily 12.  Hyperlipidemia.  Lipitor  13.  Diastolic congestive heart failure.  Lasix  20 mg daily, Cozaar  25 mg daily, Aldactone  25 mg daily.  Monitor for any signs of fluid overload 14.  Sacral wound.  Wound care nurse follow-up.  Covered with foam dressing 15.  Dysphagia.  Dysphagia #2 thin liquids.  Nasogastric tube for nutritional support. Just upgraded diet- might not need Cortrak long   Toribio JINNY Pitch,  PA-C 03/20/2023  I have personally performed a face to face diagnostic evaluation of this patient and formulated the key components of the plan.  Additionally, I have personally reviewed laboratory data, imaging studies, as well as relevant notes and concur with the physician assistant's documentation above.   The patient's status has not changed from the original H&P.  Any changes in documentation from the acute care chart have been noted above.

## 2023-03-20 NOTE — Progress Notes (Signed)
 Inpatient Rehab Admissions Coordinator:   Offered peer to peer by [facility] [employer] (servicing [facility]).  Erin Barrett, PA-C, aware and has contacted them.  She is awaiting their return call.  Will follow.   Reche Lowers, PT, DPT Admissions Coordinator [phone] 03/20/23  10:39 AM

## 2023-03-20 NOTE — H&P (Signed)
 Physical Medicine and Rehabilitation Admission H&P    CC: SOB/weakness   HPI: Walter Vargas. Mathew is a 78 year old right-handed male with history significant for hypertension, heart failure/cardiomyopathy with reduced ejection fraction 20%/mitral regurgitation, history of VT, quit smoking 47 years ago.  Per chart review patient lives alone.  1 level home 3 steps to entry.  He has a sister and brother-in-law who plans to provide assistance on discharge.  Presented 02/10/2023 with increasing shortness of breath over the last few months as well as chest tightness as well as abdominal discomfort.  Family had noted some recent altered mental status.  In the ED BNP was 4500 high-sensitivity troponin 13,600-13,871.  EKG revealed sinus rhythm at 96 bpm with LVH and secondary repolarization abnormality.  Cranial CT scan negative for acute changes.  CT of the chest was notable for pulmonary edema and small pleural effusions.  He was given IV Lasix  and IV heparin  and cardiology services consulted.  CT of the abdomen pelvis showed a distended gallbladder but otherwise unremarkable.  A subsequent HIDA scan ruled out cholecystitis.  He had catheterization completed showing left ventricular ejection fraction of less than 20% with global hypokinesis and mild concentric LV hypertrophy.  RV function was moderately reduced.  There was moderate mitral insufficiency and moderate tricuspid insufficiency.  No significant AI or AS.  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  Patient was cleared for surgery and underwent insertion of Impells ventricular assistive device 03/01/2023 per Dr. Cherrie followed by CABG x 5 03/02/2023 per Dr. Kerrin complicated by postoperative bleeding with reexploration  03/02/2023.SABRA  Patient was slowly extubated.  Postoperative chest tubes have since been removed.  Impella removed 12/30.  Hospital course placed on Lovenox  03/07/2023 for DVT prophylaxis venous doppler negative.  Acute blood loss anemia 9.5 after transfusion receiving 4 units FFP 2 units cryo and 2 platelets and monitored.  Initially placed on Zosyn  12/25 for low-grade fever suspect aspiration pneumonia.  Develop some cellulitis from right vein harvest site after CABG completing course of Ancef .  Bouts of agitation and restlessness placed on Seroquel .  He is currently on dysphagia #2 thin liquid diet as well as nasogastric tube for nutritional support.  Wound care nurse follow-up for sacral wound cover with foam dressing.  Due to patient decreased functional mobility was admitted for a comprehensive rehab program.   Pt reports using purewick to void- doesn't remember the name of it, but says it's easier than getting up.  Had MBS- diet upgraded to D2 thin diet- but still has Cortrak.  Pt admits still feels confused, not quite himself (family friend storngly agrees at bedside).  Irritation from R inner leg/esp lower thigh- bothersome.  Large BM today- in [facility] Also L hip bothers him from hernia.        Review of Systems  Constitutional:  Positive for fever and malaise/fatigue.  HENT:  Negative for hearing loss.   Eyes:  Negative for blurred vision and double vision.  Respiratory:  Positive for shortness of breath. Negative for cough and wheezing.   Cardiovascular:  Positive for chest pain, palpitations and leg swelling.  Gastrointestinal:  Positive for abdominal pain and constipation. Negative for nausea and vomiting.  Genitourinary:  Positive for urgency. Negative for dysuria, flank pain and hematuria.  Musculoskeletal:  Positive for joint pain  and myalgias.  Skin:  Negative for rash.  Neurological:  Positive for weakness.  Psychiatric/Behavioral:  Positive for memory loss.   All other systems  reviewed and are negative.       Past Medical History:  Diagnosis Date   Essential hypertension     HFrEF (heart failure with reduced ejection fraction) (HCC)     Mitral regurgitation               Past Surgical History:  Procedure Laterality Date   CORONARY ARTERY BYPASS GRAFT N/A 03/01/2023    Procedure: CORONARY ARTERY BYPASS GRAFTING X 5, USING LEFT INTERNAL MAMMARY ARTERY AND ENDOSCOPICALLY HARVESTED RIGHT SAPHENOUS VEIN GRAFT;  Surgeon: Kerrin Elspeth BROCKS, MD;  Location: [facility] OR;  Service: Open Heart Surgery;  Laterality: N/A;   EXPLORATION POST OPERATIVE OPEN HEART N/A 03/01/2023    Procedure: EXPLORATION POST OPERATIVE OPEN HEART;  Surgeon: Kerrin Elspeth BROCKS, MD;  Location: [facility] OR;  Service: Open Heart Surgery;  Laterality: N/A;   NO PAST SURGERIES       PLACEMENT OF IMPELLA LEFT VENTRICULAR ASSIST DEVICE   03/01/2023    Procedure: PLACEMENT OF IMPELLA LEFT VENTRICULAR ASSIST DEVICE;  Surgeon: Kerrin Elspeth BROCKS, MD;  Location: [facility] OR;  Service: Open Heart Surgery;;   REMOVAL OF IMPELLA LEFT VENTRICULAR ASSIST DEVICE N/A 03/12/2023    Procedure: REMOVAL OF IMPELLA LEFT VENTRICULAR ASSIST DEVICE;  Surgeon: Kerrin Elspeth BROCKS, MD;  Location: [facility] OR;  Service: Open Heart Surgery;  Laterality: N/A;   RIGHT/LEFT HEART CATH AND CORONARY ANGIOGRAPHY N/A 02/13/2023    Procedure: RIGHT/LEFT HEART CATH AND CORONARY ANGIOGRAPHY;  Surgeon: Mady Bruckner, MD;  Location: [facility] INVASIVE CV LAB;  Service: Cardiovascular;  Laterality: N/A;   TEE WITHOUT CARDIOVERSION N/A 03/01/2023    Procedure: TRANSESOPHAGEAL ECHOCARDIOGRAM (TEE);  Surgeon: Kerrin Elspeth BROCKS, MD;  Location: [facility] OR;  Service: Open Heart Surgery;  Laterality: N/A;   TEE WITHOUT CARDIOVERSION N/A 03/12/2023    Procedure: TRANSESOPHAGEAL ECHOCARDIOGRAM (TEE);  Surgeon: Kerrin Elspeth BROCKS, MD;  Location: [facility] OR;  Service: Open Heart Surgery;  Laterality: N/A;             Family History  Problem Relation Age of  Onset   Diabetes Mother          gestational diabetes   Hypertension Father     Alcohol abuse Father     Heart disease Brother     Alcohol abuse Brother     Drug abuse Brother          Social History:  reports that he quit smoking about 47 years ago. His smoking use included cigarettes. He started smoking about 57 years ago. He has a 5 pack-year smoking history. He has never used smokeless tobacco. He reports that he does not currently use alcohol. He reports that he does not use drugs. Allergies:  Allergies  No Active Allergies         Medications Prior to Admission  Medication Sig Dispense Refill   aspirin  EC 81 MG tablet Take 81 mg by mouth daily.       metoprolol  succinate (TOPROL -XL) 25 MG 24 hr tablet Take 25 mg by mouth daily.       Multiple Vitamins-Minerals (MULTIVITAMIN MEN 50+) TABS Take 1 tablet by mouth daily.       atorvastatin  (LIPITOR ) 80 MG tablet Take 1 tablet (80 mg total) by mouth daily. (Patient not taking: Reported on 02/14/2023)       furosemide  (LASIX ) 20 MG  tablet Take 20 mg by mouth daily. (Patient not taking: Reported on 02/14/2023)       heparin  25000 UT/250ML infusion Inject 1,250 Units/hr into the vein continuous. (Patient not taking: Reported on 02/14/2023)       spironolactone  (ALDACTONE ) 25 MG tablet Take 0.5 tablets (12.5 mg total) by mouth daily. (Patient not taking: Reported on 02/14/2023)                  Home: Home Living Family/patient expects to be discharged to:: Private residence Living Arrangements: Alone Available Help at Discharge: Family, Available 24 hours/day Type of Home: Apartment Home Access: Stairs to enter [employer] of Steps: 3 Entrance Stairs-Rails: Right Home Layout: One level Home Equipment: None Additional Comments: Per chart review, lives alone; sister and brother-in-law (in good shape) live in [place], state they're retired and plan to stay with pt for initial 24/7 assist at d/c   Functional  History: Prior Function Prior Level of Function : Independent/Modified Independent, Driving Mobility Comments: independent without DME. drives. enjoys going to [employer] to socialize in the morning ADLs Comments: Per chart independent; pt with limited verbalizations this session   Functional Status:  Mobility: Bed Mobility Overal bed mobility: Needs Assistance Bed Mobility: Rolling, Sidelying to Sit Rolling: Mod assist Sidelying to sit: Min assist Supine to sit: Mod assist, HOB elevated Sit to sidelying: Mod assist General bed mobility comments: pt not following directions for log roll, adamant HOB be elevated, then able to power up to long sitting without UE support, max cues to move BLEs to EOB, ultimately able to do so with CGA for cuing Transfers Overall transfer level: Needs assistance Equipment used: Rolling walker (2 wheels) Transfers: Sit to/from Stand Sit to Stand: Mod assist, Max assist General transfer comment: multiple sit<>stands from EOB (2x), recliner (2x) and BSC (1x) to RW, pt requiring repeated, max verbal cues for hand placement and sternal precautions, modA for trunk elevation with momentum to power up, mod-maxA for eccentric lower to sit secondary to significant posterior lean and difficulty following commands for hand placement; repeated cues to widen BOS Ambulation/Gait Ambulation/Gait assistance: Min assist, Mod assist Gait Distance (Feet): 6 Feet Assistive device: Rolling walker (2 wheels) Gait Pattern/deviations: Step-through pattern, Decreased stride length, Leaning posteriorly, Narrow base of support, Trunk flexed General Gait Details: pt with fluctuating ability to overcome significant posterior lean and sequence steps with RW this session; initial pivotal steps from bed>recliner though pt walking forward with significant posterior lean requiring maxA to safely pivot hips back to bed to prevent fall; additional pivotal steps from bed>recliner>BSC with RW and  min-modA for balance; pt then able to walk ~4' forwards/backwards with RW and minA before adamant about return to sitting (I'm done) Gait velocity: Decreased Gait velocity interpretation: 1.31 - 2.62 ft/sec, indicative of limited community ambulator   ADL: ADL Overall ADL's : Needs assistance/impaired Grooming: Oral care, Sitting, Set up Grooming Details (indicate cue type and reason): using suction toothbrush Lower Body Dressing: Moderate assistance, Sitting/lateral leans Lower Body Dressing Details (indicate cue type and reason): socks Toilet Transfer: Minimal assistance, Stand-pivot, Rolling walker (2 wheels) Toilet Transfer Details (indicate cue type and reason): Poor command following throughout transfer and signifciant retropulsion in standing requiring constant mod A Toileting- Clothing Manipulation and Hygiene: Total assistance, +2 for physical assistance, +2 for safety/equipment Toileting - Clothing Manipulation Details (indicate cue type and reason): posterior pericare provided by nursing staff Functional mobility during ADLs: Moderate assistance, Minimal assistance, Rolling walker (2 wheels)  Cognition: Cognition Overall Cognitive Status: Impaired/Different from baseline Orientation Level: Oriented to person, Oriented to place, Disoriented to time, Oriented to situation Cognition Arousal: Alert Behavior During Therapy: Anxious, Impulsive, Restless Overall Cognitive Status: Impaired/Different from baseline Area of Impairment: Attention, Memory, Safety/judgement, Following commands, Awareness, Problem solving, Orientation Orientation Level: Disoriented to, Time, Situation, Place Current Attention Level: Sustained Memory: Decreased short-term memory, Decreased recall of precautions Following Commands: Follows one step commands with increased time, Follows one step commands inconsistently Safety/Judgement: Decreased awareness of safety, Decreased awareness of  deficits Awareness: Intellectual Problem Solving: Slow processing, Decreased initiation, Difficulty sequencing, Requires verbal cues, Requires tactile cues General Comments: little to no recall of sternal precautions; requires direct, simple cues for all mobility. poor attention and awareness, impulsive with movement and poor problem solving. pt and his vistor report he is having a bad morning and friend concerned cognition is worse today.   Physical Exam: Blood pressure 123/71, pulse 63, temperature (!) 96.2 F (35.7 C), temperature source Axillary, resp. rate (!) 25, height 5' 8 (1.727 m), weight 62.8 kg, SpO2 97%. Physical Exam Vitals and nursing note reviewed. Exam conducted with a chaperone present.  Constitutional:      Comments: Thin, almost frail appearing man almost supine in bed; awake, alert, but not exactly appropriate; family friend [name] at bedside, NAD  HENT:     Head: Normocephalic and atraumatic.     Comments: On tele Cortrak in place Cranial nerves intact on exam B/L      Right Ear: External ear normal.     Left Ear: External ear normal.     Nose: Nose normal. No congestion.     Mouth/Throat:     Mouth: Mucous membranes are dry.     Pharynx: Oropharynx is clear. No oropharyngeal exudate.  Eyes:     General:        Right eye: No discharge.        Left eye: No discharge.     Extraocular Movements: Extraocular movements intact.  Cardiovascular:     Rate and Rhythm: Normal rate and regular rhythm.     Heart sounds: Normal heart sounds. No murmur heard.    No gallop.     Comments: S/p 5 vessel CABG- incision well healed   Pulmonary:     Effort: Pulmonary effort is normal. No respiratory distress.     Breath sounds: Normal breath sounds. No wheezing, rhonchi or rales.  Abdominal:     General: Abdomen is flat. Bowel sounds are normal. There is no distension.     Palpations: Abdomen is soft.     Tenderness: There is no abdominal tenderness.     Hernia: A hernia  is present.  Musculoskeletal:     Cervical back: Neck supple. No tenderness.     Comments: Ue's 5-/5 except deltoids and biceps 4+/5 LE's HF 4+/5; otherwise 5-/5 B/L   Skin:    General: Skin is warm and dry.     Comments: Ecchymoses in B/L UEs IV R forearm looks OK And PICC LU upper arm looks well R inner leg- thigh and calf- a lot of bruising- dark reddish/purple/a little yellow Scabbing from vein harvest Some swelling like large blister? On R inner calf with line drawn around redness   Neurological:     Comments: Patient is alert and makes eye contact with examiner.  Follows commands.  Provides his name and age but very limited medical historian.  He needed some cues for place and situation. Knew [place]  and in hospital due to heart issues'- knew 2025, January ,but no idea day/date Couldn't come up with friend's name at bedside- known 15 years; Couldn't explain more about hospitalization No hoffman's or clonus B/L  Psychiatric:     Comments: Somewhat confused- higher level cognition impaired        Lab Results Last 48 Hours        Results for orders placed or performed during the hospital encounter of 02/14/23 (from the past 48 hours)  CBC     Status: Abnormal    Collection Time: 03/18/23 10:54 AM  Result Value Ref Range    WBC 9.2 4.0 - 10.5 K/uL    RBC 3.46 (L) 4.22 - 5.81 MIL/uL    Hemoglobin 9.8 (L) 13.0 - 17.0 g/dL    HCT 68.4 (L) 60.9 - 52.0 %    MCV 91.0 80.0 - 100.0 fL    MCH 28.3 26.0 - 34.0 pg    MCHC 31.1 30.0 - 36.0 g/dL    RDW 82.2 (H) 88.4 - 15.5 %    Platelets 745 (H) 150 - 400 K/uL    nRBC 0.0 0.0 - 0.2 %      Comment: Performed at [facility], [address] [place], [place] [address]  Glucose, capillary     Status: None    Collection Time: 03/18/23 11:12 AM  Result Value Ref Range    Glucose-Capillary 85 70 - 99 mg/dL      Comment: Glucose reference range applies only to samples taken after fasting for at least 8 hours.  Glucose,  capillary     Status: None    Collection Time: 03/18/23  3:35 PM  Result Value Ref Range    Glucose-Capillary 95 70 - 99 mg/dL      Comment: Glucose reference range applies only to samples taken after fasting for at least 8 hours.  Glucose, capillary     Status: None    Collection Time: 03/18/23  9:09 PM  Result Value Ref Range    Glucose-Capillary 87 70 - 99 mg/dL      Comment: Glucose reference range applies only to samples taken after fasting for at least 8 hours.  Renal function panel     Status: Abnormal    Collection Time: 03/19/23  5:02 AM  Result Value Ref Range    Sodium 129 (L) 135 - 145 mmol/L    Potassium 4.6 3.5 - 5.1 mmol/L    Chloride 99 98 - 111 mmol/L    CO2 22 22 - 32 mmol/L    Glucose, Bld 135 (H) 70 - 99 mg/dL      Comment: Glucose reference range applies only to samples taken after fasting for at least 8 hours.    BUN 38 (H) 8 - 23 mg/dL    Creatinine, Ser 9.29 0.61 - 1.24 mg/dL    Calcium  8.1 (L) 8.9 - 10.3 mg/dL    Phosphorus 3.4 2.5 - 4.6 mg/dL    Albumin  2.2 (L) 3.5 - 5.0 g/dL    GFR, Estimated >39 >39 mL/min      Comment: (NOTE) Calculated using the CKD-EPI Creatinine Equation (2021)      Anion gap 8 5 - 15      Comment: Performed at [facility], [address] [place], [place] [address]  CBC     Status: Abnormal    Collection Time: 03/19/23  5:02 AM  Result Value Ref Range    WBC 7.9 4.0 -  10.5 K/uL    RBC 3.28 (L) 4.22 - 5.81 MIL/uL    Hemoglobin 9.5 (L) 13.0 - 17.0 g/dL    HCT 69.5 (L) 60.9 - 52.0 %    MCV 92.7 80.0 - 100.0 fL    MCH 29.0 26.0 - 34.0 pg    MCHC 31.3 30.0 - 36.0 g/dL    RDW 82.1 (H) 88.4 - 15.5 %    Platelets 666 (H) 150 - 400 K/uL    nRBC 0.0 0.0 - 0.2 %      Comment: Performed at [facility], [address], [place], [place] [address]  Cooxemetry Panel (carboxy, met, total hgb, O2 sat)     Status: Abnormal    Collection Time: 03/19/23  5:13 AM  Result Value Ref Range    Total hemoglobin 9.8 (L) 12.0 - 16.0  g/dL    O2 Saturation 41.0 %    Carboxyhemoglobin 2.0 (H) 0.5 - 1.5 %    Methemoglobin <0.7 0.0 - 1.5 %      Comment: Performed at [facility], [address] [place], [place] [address]  Glucose, capillary     Status: Abnormal    Collection Time: 03/19/23  6:30 AM  Result Value Ref Range    Glucose-Capillary 137 (H) 70 - 99 mg/dL      Comment: Glucose reference range applies only to samples taken after fasting for at least 8 hours.  Glucose, capillary     Status: None    Collection Time: 03/19/23 11:48 AM  Result Value Ref Range    Glucose-Capillary 86 70 - 99 mg/dL      Comment: Glucose reference range applies only to samples taken after fasting for at least 8 hours.  Cooxemetry Panel (carboxy, met, total hgb, O2 sat)     Status: Abnormal    Collection Time: 03/19/23 12:57 PM  Result Value Ref Range    Total hemoglobin 10.3 (L) 12.0 - 16.0 g/dL    O2 Saturation 42.3 %    Carboxyhemoglobin 2.1 (H) 0.5 - 1.5 %    Methemoglobin 0.9 0.0 - 1.5 %      Comment: Performed at [facility], [address] [place], [place] [address]  Glucose, capillary     Status: None    Collection Time: 03/19/23  4:22 PM  Result Value Ref Range    Glucose-Capillary 85 70 - 99 mg/dL      Comment: Glucose reference range applies only to samples taken after fasting for at least 8 hours.  Glucose, capillary     Status: None    Collection Time: 03/19/23  9:12 PM  Result Value Ref Range    Glucose-Capillary 92 70 - 99 mg/dL      Comment: Glucose reference range applies only to samples taken after fasting for at least 8 hours.  Renal function panel     Status: Abnormal    Collection Time: 03/20/23  3:30 AM  Result Value Ref Range    Sodium 131 (L) 135 - 145 mmol/L    Potassium 4.6 3.5 - 5.1 mmol/L    Chloride 99 98 - 111 mmol/L    CO2 23 22 - 32 mmol/L    Glucose, Bld 128 (H) 70 - 99 mg/dL      Comment: Glucose reference range applies only to samples taken after fasting for at least 8 hours.     BUN 37 (H) 8 - 23 mg/dL    Creatinine, Ser 9.20 0.61 -  1.24 mg/dL    Calcium  8.4 (L) 8.9 - 10.3 mg/dL    Phosphorus 3.8 2.5 - 4.6 mg/dL    Albumin  2.4 (L) 3.5 - 5.0 g/dL    GFR, Estimated >39 >39 mL/min      Comment: (NOTE) Calculated using the CKD-EPI Creatinine Equation (2021)      Anion gap 9 5 - 15      Comment: Performed at [facility], [address] [place], [place] [address]  Cooxemetry Panel (carboxy, met, total hgb, O2 sat)     Status: Abnormal    Collection Time: 03/20/23  3:30 AM  Result Value Ref Range    Total hemoglobin 9.9 (L) 12.0 - 16.0 g/dL    O2 Saturation 37.3 %    Carboxyhemoglobin 2.9 (H) 0.5 - 1.5 %    Methemoglobin <0.7 0.0 - 1.5 %      Comment: Performed at [facility], [address], [place], [place] [address]  Glucose, capillary     Status: Abnormal    Collection Time: 03/20/23  6:14 AM  Result Value Ref Range    Glucose-Capillary 149 (H) 70 - 99 mg/dL      Comment: Glucose reference range applies only to samples taken after fasting for at least 8 hours.       Imaging Results (Last 48 hours)  DG Chest Port 1 View Result Date: 03/20/2023 CLINICAL DATA:  Pleural effusion, cough. EXAM: PORTABLE CHEST 1 VIEW COMPARISON:  March 16, 2023. FINDINGS: Stable cardiomediastinal silhouette. Stable bibasilar atelectasis and effusions, right greater than left. Feeding tube is seen entering stomach. IMPRESSION: Stable bibasilar atelectasis and pleural effusions, right greater than left. Electronically Signed   By: Lynwood Landy Raddle M.D.   On: 03/20/2023 09:41    VAS US  LOWER EXTREMITY ARTERIAL DUPLEX Result Date: 03/19/2023 LOWER EXTREMITY ARTERIAL DUPLEX STUDY Patient Name:  AMELIO BROSKY  Date of Exam:   03/19/2023 Medical Rec #: [id number]      Accession #:    [id number] Date of Birth: Oct 15, 1945      Patient Gender: M Patient Age:   68 years Exam Location:  [facility] Procedure:      VAS US  LOWER EXTREMITY ARTERIAL DUPLEX Referring Phys: RIA COMMANDER --------------------------------------------------------------------------------  Indications: Peripheral artery disease (left carotid artery stenosis 80-99%),              and Patient status post CABG and Impella. Some difficulty finding              pulses at times and large errythemous area medial calf near site of              GSV harvest, question cellulitis. High Risk Factors: Hypertension, past history of smoking, prior MI, coronary                    artery disease. Other Factors: Recent Covid 19 infection, Heart failure with reduced EF.  Current ABI: N/A Limitations: Altered mental status, movement, body habitus Comparison Study: No prior study on file Performing Technologist: Alberta Lis RVS  Examination Guidelines: A complete evaluation includes B-mode imaging, spectral Doppler, color Doppler, and power Doppler as needed of all accessible portions of each vessel. Bilateral testing is considered an integral part of a complete examination. Limited examinations for reoccurring indications may be performed as noted.  +----------+--------+-----+--------+-----------+---------------+ RIGHT     PSV cm/sRatioStenosisWaveform   Comments        +----------+--------+-----+--------+-----------+---------------+ CFA Prox  98                   multiphasic                +----------+--------+-----+--------+-----------+---------------+ DFA       42                   multiphasic                +----------+--------+-----+--------+-----------+---------------+ SFA Prox  47                   multiphasic                +----------+--------+-----+--------+-----------+---------------+ SFA Mid   56                   multiphasic                +----------+--------+-----+--------+-----------+---------------+ SFA Distal102                  multiphasic                +----------+--------+-----+--------+-----------+---------------+ POP Prox  43                   multiphasic                 +----------+--------+-----+--------+-----------+---------------+ POP Mid   89                   multiphasic                +----------+--------+-----+--------+-----------+---------------+ POP Distal82                   monophasic                 +----------+--------+-----+--------+-----------+---------------+ ATA Distal50                   monophasic                 +----------+--------+-----+--------+-----------+---------------+ PTA Prox  40                   monophasic                 +----------+--------+-----+--------+-----------+---------------+ PTA Mid   22                   monophasic collateral flow +----------+--------+-----+--------+-----------+---------------+ PTA Distal17                   monophasic                 +----------+--------+-----+--------+-----------+---------------+ Complex fluid filled area noted from proximal to distal calf near site of GSV harvest and area of errythema, no vascularization noted.  +-----------+--------+-----+--------+--------------+-----------+ LEFT       PSV cm/sRatioStenosisWaveform      Comments    +-----------+--------+-----+--------+--------------+-----------+ CFA Prox   44                   biphasic                  +-----------+--------+-----+--------+--------------+-----------+ DFA        28                   biphasic                  +-----------+--------+-----+--------+--------------+-----------+ SFA Prox   38  biphasic                  +-----------+--------+-----+--------+--------------+-----------+ SFA Mid    49                   biphasic                  +-----------+--------+-----+--------+--------------+-----------+ SFA Distal 33                   biphasic                  +-----------+--------+-----+--------+--------------+-----------+ POP Prox   33                   biphasic                   +-----------+--------+-----+--------+--------------+-----------+ POP Mid    43                   biphasic                  +-----------+--------+-----+--------+--------------+-----------+ POP Distal 25                   biphasic                  +-----------+--------+-----+--------+--------------+-----------+ TP Trunk   67                   biphasic                  +-----------+--------+-----+--------+--------------+-----------+ ATA Prox                occluded                          +-----------+--------+-----+--------+--------------+-----------+ ATA Mid                 occluded                          +-----------+--------+-----+--------+--------------+-----------+ ATA Distal              occluded                          +-----------+--------+-----+--------+--------------+-----------+ PTA Prox                        not visualized            +-----------+--------+-----+--------+--------------+-----------+ PTA Mid    37                   monophasic    collaterals +-----------+--------+-----+--------+--------------+-----------+ PTA Distal 41                   monophasic    collaterals +-----------+--------+-----+--------+--------------+-----------+ PERO Prox                       not visualized            +-----------+--------+-----+--------+--------------+-----------+ PERO Mid                        not visualized            +-----------+--------+-----+--------+--------------+-----------+ PERO Distal15                   monophasic                +-----------+--------+-----+--------+--------------+-----------+  Summary: Right: Diffuse calcific plaque throughout the right lower extremity arterial system. No significant areas of stenosis noted. Collateral flow noted in the mid calf at the PTA. Complex fluid filled area noted from proximal to distal calf near site of GSV harvest and area of errythema, no vascularization noted.  Left: Diffuse calcific plaque noted throughout the left lower extremity. No significant areas of stenosis noted. The anterior tibial artery appears occluded. Collateral flow noted mid to distal calf at PTA.  See table(s) above for measurements and observations. Electronically signed by Lonni Gaskins MD on 03/19/2023 at 1:53:10 PM.    Final            Blood pressure 123/71, pulse 63, temperature (!) 96.2 F (35.7 C), temperature source Axillary, resp. rate (!) 25, height 5' 8 (1.727 m), weight 62.8 kg, SpO2 97%.   Medical Problem List and Plan: 1. 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  Sternal precautions             -patient may  shower if cover PICC< etc             -ELOS/Goals: 14-18 days supervision to mod I 2.  Antithrombotics: -DVT/anticoagulation:  Pharmaceutical: Lovenox .  Venous Doppler studies negative             -antiplatelet therapy: Aspirin  81 mg daily.  Heart failure team considering adding Plavix  3. Pain Management: Oxycodone  as needed 4. Mood/Behavior/Sleep: Provide emotional support             -antipsychotic agents: Seroquel  25 mg daily at 1000 and 50 mg nightly 5. Neuropsych/cognition: This patient is capable of making decisions on his own behalf. 6. Skin/Wound Care: Routine skin checks. His right lower extremity harvest site completing course of Ancef  for cellulitis 7. Fluids/Electrolytes/Nutrition: Routine in and outs with follow-up chemistries 8.  Acute blood loss anemia.  Follow-up CBC 9.  COVID-positive.  Completed Paxlovid  10.  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  Seen by vascular surgery Dr Norman Serve and plan outpatient follow-up 1 month 11.  History of VT peri-CABG.  Amiodarone  200 mg daily, Lanoxin  0.125 mg daily 12.  Hyperlipidemia.  Lipitor  13.  Diastolic congestive heart failure.  Lasix  20 mg daily, Cozaar   25 mg daily, Aldactone  25 mg daily.  Monitor for any signs of fluid overload 14.  Sacral wound.  Wound care nurse follow-up.  Covered with foam dressing 15.  Dysphagia.  Dysphagia #2 thin liquids.  Nasogastric tube for nutritional support. Just upgraded diet- might not need Cortrak long     Toribio JINNY Pitch, PA-C 03/20/2023   I have personally performed a face to face diagnostic evaluation of this patient and formulated the key components of the plan.  Additionally, I have personally reviewed laboratory data, imaging studies, as well as relevant notes and concur with the physician assistant's documentation above.   The patient's status has not changed from the original H&P.  Any changes in documentation from the acute care chart have been noted above.

## 2023-03-20 NOTE — Discharge Summary (Signed)
Physician Discharge Summary  Patient ID: Walter Vargas MRN: [id number] DOB/AGE: Feb 04, 1946 78 y.o.  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, without accompanying behavioral or psychological disturbance (HCC)   Acute ischemic left middle cerebral artery (MCA) stroke (HCC) Cardiogenic shock Acute/subacute infarct left MCA likely from left ICA severe stenosis Mood stabilization/delirium Acute blood loss anemia COVID-19 positive Carotid artery stenosis History of VT Hyperlipidemia Diastolic congestive heart failure Sacral pressure injury Dysphagia  Discharged Condition: Stable  Significant Diagnostic Studies: ECHOCARDIOGRAM COMPLETE Result Date: 04/20/2023    ECHOCARDIOGRAM REPORT   Patient Name:   Walter Vargas Date of Exam: 04/20/2023 Medical Rec #:  [id number]     Height:       68.0 in Accession #:    [id number]    Weight:       108.5 lb Date of Birth:  1946/02/28     BSA:          1.576 m Patient Age:    77 years      BP:           120/62 mmHg Patient Gender: M             HR:           54 bpm. Exam Location:  Inpatient Procedure: 2D Echo, Color Doppler and Cardiac Doppler Indications:    Stroke I63.9  History:        Patient has prior history of Echocardiogram examinations, most                 recent 02/14/2023. HFrEF; Risk Factors:Hypertension.  Sonographer:    Harriette Bouillon [facility] Referring Phys: [id number] Scheryl Marten XU IMPRESSIONS  1. Left ventricular ejection fraction, by estimation, is 20 to 25%. The left ventricle has severely decreased function. The left ventricle demonstrates global hypokinesis with septal-lateral dyssynchrony consistent with IVCD. The left ventricular internal cavity size was mildly dilated. Left ventricular diastolic parameters are consistent with Grade II diastolic dysfunction (pseudonormalization).  2. Right  ventricular systolic function is mildly reduced. The right ventricular size is normal. Tricuspid regurgitation signal is inadequate for assessing PA pressure.  3. Left atrial size was moderately dilated.  4. Right atrial size was mildly dilated.  5. The mitral valve is normal in structure. Mild mitral valve regurgitation. No evidence of mitral stenosis.  6. The aortic valve is tricuspid. Aortic valve regurgitation is not visualized. No aortic stenosis is present.  7. The inferior vena cava is normal in size with greater than 50% respiratory variability, suggesting right atrial pressure of 3 mmHg. FINDINGS  Left Ventricle: Left ventricular ejection fraction, by estimation, is 20 to 25%. The left ventricle has severely decreased function. The left ventricle demonstrates global hypokinesis. The left ventricular internal cavity size was mildly dilated. There is no left ventricular hypertrophy. Left ventricular diastolic parameters are consistent with Grade II diastolic dysfunction (pseudonormalization). Right Ventricle: The right ventricular size is normal. No increase in right ventricular wall thickness. Right ventricular systolic function is mildly reduced. Tricuspid regurgitation signal is inadequate for assessing PA pressure. Left Atrium: Left atrial size was moderately dilated. Right Atrium: Right atrial size was mildly dilated. Pericardium: Trivial pericardial effusion is present. Mitral Valve: The mitral valve is normal in structure. Mild mitral annular calcification. Mild mitral valve regurgitation. No evidence of mitral valve stenosis. Tricuspid Valve: The tricuspid valve  is normal in structure. Tricuspid valve regurgitation is trivial. Aortic Valve: The aortic valve is tricuspid. Aortic valve regurgitation is not visualized. No aortic stenosis is present. Pulmonic Valve: The pulmonic valve was normal in structure. Pulmonic valve regurgitation is not visualized. Aorta: The aortic root is normal in size and  structure. Venous: The inferior vena cava is normal in size with greater than 50% respiratory variability, suggesting right atrial pressure of 3 mmHg. IAS/Shunts: No atrial level shunt detected by color flow Doppler.  LEFT VENTRICLE PLAX 2D LVIDd:         6.10 cm      Diastology LVIDs:         5.20 cm      LV e' medial:   6.41 cm/s LV PW:         0.90 cm      LV E/e' medial: 9.1 LV IVS:        0.90 cm LVOT diam:     2.00 cm LV SV:         35 LV SV Index:   23 LVOT Area:     3.14 cm  LV Volumes (MOD) LV vol d, MOD A2C: 200.0 ml LV vol d, MOD A4C: 166.0 ml LV vol s, MOD A2C: 113.0 ml LV vol s, MOD A4C: 124.0 ml LV SV MOD A2C:     87.0 ml LV SV MOD A4C:     166.0 ml LV SV MOD BP:      74.6 ml RIGHT VENTRICLE         IVC TAPSE (M-mode): 1.0 cm  IVC diam: 1.30 cm LEFT ATRIUM             Index        RIGHT ATRIUM           Index LA diam:        4.60 cm 2.92 cm/m   RA Area:     20.00 cm LA Vol (A2C):   76.3 ml 48.42 ml/m  RA Volume:   57.50 ml  36.49 ml/m LA Vol (A4C):   97.5 ml 61.88 ml/m LA Biplane Vol: 88.0 ml 55.85 ml/m  AORTIC VALVE LVOT Vmax:   58.10 cm/s LVOT Vmean:  40.900 cm/s LVOT VTI:    0.113 m  AORTA Ao Root diam: 3.50 cm Ao Asc diam:  3.20 cm MITRAL VALVE MV Area (PHT): 3.45 cm    SHUNTS MV Decel Time: 220 msec    Systemic VTI:  0.11 m MV E velocity: 58.30 cm/s  Systemic Diam: 2.00 cm MV A velocity: 56.10 cm/s MV E/A ratio:  1.04 Dalton McleanMD Electronically signed by Wilfred Lacy Signature Date/Time: 04/20/2023/9:19:25 AM    Final    CT ANGIO HEAD NECK W WO CM Result Date: 04/19/2023 CLINICAL DATA:  Stroke/TIA EXAM: CT ANGIOGRAPHY HEAD AND NECK WITH AND WITHOUT CONTRAST TECHNIQUE: Multidetector CT imaging of the head and neck was performed using the standard protocol during bolus administration of intravenous contrast. Multiplanar CT image reconstructions and MIPs were obtained to evaluate the vascular anatomy. Carotid stenosis measurements (when applicable) are obtained utilizing NASCET  criteria, using the distal internal carotid diameter as the denominator. RADIATION DOSE REDUCTION: This exam was performed according to the departmental dose-optimization program which includes automated exposure control, adjustment of the mA and/or kV according to patient size and/or use of iterative reconstruction technique. CONTRAST:  75mL OMNIPAQUE IOHEXOL 350 MG/ML SOLN COMPARISON:  None Available. FINDINGS: CT HEAD FINDINGS Brain: There is no mass,  hemorrhage or extra-axial collection. The size and configuration of the ventricles and extra-axial CSF spaces are normal. There is hypoattenuation of the white matter, most commonly indicating chronic small vessel disease. Vascular: Atherosclerotic calcification of the internal carotid arteries at the skull base. No abnormal hyperdensity of the major intracranial arteries or dural venous sinuses. Skull: The visualized skull base, calvarium and extracranial soft tissues are normal. Sinuses/Orbits: No fluid levels or advanced mucosal thickening of the visualized paranasal sinuses. No mastoid or middle ear effusion. Normal orbits. CTA NECK FINDINGS Skeleton: Remote median sternotomy. Other neck: There is an incompletely visualized peripherally enhancing, tubular fluid collection extending from the anterior lower right neck into the anterior mediastinum, with a diameter of 12 mm. Upper chest: No pneumothorax or pleural effusion. No nodules or masses. Aortic arch: There is calcific atherosclerosis of the aortic arch. Conventional 3 vessel aortic branching pattern. RIGHT carotid system: There is mild atherosclerotic calcification at the bifurcation and extending into the proximal ICA with less than 50% stenosis. LEFT carotid system: There is a large amount of mixed density plaque at the carotid bifurcation and extending into the internal carotid artery causing severe stenosis and angiographic string sign Vertebral arteries: Right dominant configuration. There is no  dissection, occlusion or flow-limiting stenosis to the skull base (V1-V3 segments). CTA HEAD FINDINGS POSTERIOR CIRCULATION: Vertebral arteries are normal. No proximal occlusion of the anterior or inferior cerebellar arteries. Basilar artery is normal. Superior cerebellar arteries are normal. Posterior cerebral arteries are normal. ANTERIOR CIRCULATION: Atherosclerotic calcification of the internal carotid arteries at the skull base with severe stenosis of the left clinoid segment. Anterior cerebral arteries are normal. Middle cerebral arteries are normal. Venous sinuses: As permitted by contrast timing, patent. Anatomic variants: Patent right P-comm Review of the MIP images confirms the above findings. IMPRESSION: 1. No emergent large vessel occlusion. 2. 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 3. 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 4. Incompletely visualized peripherally enhancing, tubular structure extending from the anterior lower right neck into the anterior mediastinum, with a diameter of 12 mm. This is favored to be a thrombosed venous structure, possibly related to prior cardiothoracic surgery. An abscess or similar fluid collection is felt less likely. 5.  Aortic atherosclerosis (ICD10-I70.0). Electronically Signed   By: Deatra Robinson M.D.   On: 04/19/2023 22:19   MR BRAIN WO CONTRAST Result Date: 04/18/2023 CLINICAL DATA:  Mental status change, persistent or worsening. Memory loss. EXAM: MRI HEAD WITHOUT CONTRAST TECHNIQUE: Multiplanar, multiecho pulse sequences of the brain and surrounding structures were obtained without intravenous contrast. COMPARISON:  CT head without contrast 02/10/2023. FINDINGS: Brain: Five separate subcentimeter foci of restricted diffusion are present within the left corona radiata and periatrial white matter. a 7 mm lesion in the posterior left corona radiata and demonstrates peripheral  diffusion signal and central T2 signal, consistent with a subacute insult. Moderate confluent periventricular and scattered subcortical T2 hyperintensities are somewhat more prominent on the left. No acute infarct or hemorrhage is present. Deep brain nuclei are within normal limits. The ventricles are proportionate to the degree of atrophy. No significant extraaxial fluid collection is present. The brainstem and cerebellum are within normal limits. Midline structures are within normal limits. Vascular: Evaluation of vessels is somewhat limited due to patient motion. Flow is present in the anterior circulation bilaterally although the left internal carotid artery may be narrowed. CT angiography may be useful for further evaluation.  Skull and upper cervical spine: The craniocervical junction is normal. Upper cervical spine is within normal limits. Marrow signal is unremarkable. Sinuses/Orbits: The paranasal sinuses and mastoid air cells are clear. The globes and orbits are within normal limits. IMPRESSION: 1. Five separate subcentimeter foci of restricted diffusion within the left corona radiata and periatrial white matter consistent with acute infarcts. 2. 7 mm lesion in the posterior left corona radiata demonstrates peripheral diffusion signal and central T2 signal, consistent with a subacute insult. 3. Moderate confluent periventricular and scattered subcortical T2 hyperintensities bilaterally are somewhat more prominent on the left. This likely reflects the sequela of chronic microvascular ischemia. 4. Evaluation of vessels is somewhat limited due to patient motion. Flow is present in the anterior circulation bilaterally although the left internal carotid artery may be narrowed. CT angiography may be useful for further evaluation. These results will be called to the ordering clinician or representative by the Radiologist Assistant, and communication documented in the PACS or [employer]. Electronically Signed    By: Marin Roberts M.D.   On: 04/18/2023 13:40   DG CHEST PORT 1 VIEW Result Date: 03/29/2023 CLINICAL DATA:  Cough.  Postop CABG. EXAM: PORTABLE CHEST 1 VIEW COMPARISON:  Radiographs 03/20/2023 and 03/16/2023.  CT 02/10/2023. FINDINGS: 1407 hours. The heart size and mediastinal contours are stable status post median sternotomy and CABG. Feeding tube and left arm PICC have been removed in the interval. Bilateral pleural effusions have decreased in volume, and there is improved aeration of both lungs. No evidence of edema or pneumothorax. No acute osseous findings are seen. There is an old fracture of the left 5th rib IMPRESSION: Interval decrease in volume of bilateral pleural effusions and improved aeration of both lungs. No evidence of edema or pneumothorax. Electronically Signed   By: Carey Bullocks M.D.   On: 03/29/2023 18:13    Labs:  Basic Metabolic Panel: Recent Labs  Lab 04/23/23 0601  NA 138  K 4.2  CL 98  CO2 25  GLUCOSE 88  BUN 19  CREATININE 0.68  CALCIUM 9.4    CBC: Recent Labs  Lab 04/23/23 0601 04/24/23 0514  WBC 5.3 4.8  HGB 11.2* 10.6*  HCT 34.8* 33.5*  MCV 88.5 89.8  PLT 218 212    CBG: Recent Labs  Lab 04/20/23 0613  GLUCAP 71     Family history.  Mother with diabetes father with hypertension Brother with heart disease.  Denies any colon cancer esophageal cancer or rectal cancer  Brief HPI:   Walter Vargas is a 78 y.o. right-handed male with history of hypertension, heart failure/cardiomyopathy with reduced ejection fraction 20% mitral regurgitation, history of VT quit smoking 47 years ago.  Per chart review lives with spouse.  He has a sister and brother-in-law who can provide assistance.  Presented 02/10/2023 with increasing shortness of breath over the last few months as well as chest tightness and abdominal discomfort.  Family had noted some recent altered mental status.  In the ED BNP of 4500 high-sensitivity troponin of 13,600-13,871.  EKG  revealed sinus rhythm at 96 bpm with LVH and secondary repolarization abnormality.  Cranial CT scan negative for acute changes.  CT of the chest was notable for pulmonary edema and small pleural effusions.  He was given IV Lasix and IV heparin and cardiology services consulted.  CT of the abdomen and pelvis showed distended gallbladder but otherwise unremarkable.  A subsequent HIDA scan ruled out cholecystitis.  Underwent cardiac catheterization showing left ventricular ejection  fraction of less than 20% with global hypokinesis and mild concentric LV hypertrophy.  RV function was moderately reduced.  There was moderate mitral insufficiency and moderate tricuspid insufficiency.  No significant AI or AS.  Findings of carotid artery stenosis pre-CABG Dopplers with 80 to 99% left ICA stenosis vascular surgery consulted follow-up outpatient.  Initial plan for surgical intervention delayed due to hospital course COVID-19 positive undergoing treatment which has been completed.  Patient was cleared for surgery underwent insertion of Impella ventricular assistive device 03/01/2023 per Dr. Gala Romney followed by CABG x 5 03/02/2023 per Dr. Dorris Fetch complicated by postoperative bleeding with reexploration 03/02/2023.  Patient slowly extubated.  Postoperative chest tubes have since been removed.  Impella removed 12/30.  Hospital course placed on Lovenox for DVT prophylaxis venous Doppler studies negative.  Acute blood loss anemia 9.5 he has been transfused 4 units fresh frozen plasma 2 units cryo and 2 platelets and monitored.  Initially placed on Zosyn 12/25 for low grade fever suspect aspiration pneumonia.  Develop some cellulitis of right vein harvest site after CABG completed course of Ancef.  Bouts of agitation and restlessness placed on Seroquel.  He is on a dysphagia #2 thin liquid diet with nasogastric tube for nutritional support.  Wound care nurse follow-up for sacral wound cover with foam dressing.  Due to patient  decreased functional mobility was admitted for a comprehensive rehab program.   Hospital Course: Walter Vargas was admitted to rehab 03/20/2023 for inpatient therapies to consist of PT, ST and OT at least three hours five days a week. Past admission physiatrist, therapy team and rehab RN have worked together to provide customized collaborative inpatient rehab.  Pertaining to patient's debility/cardiogenic shock after CABG 03/02/2023 complicated by postoperative bleeding requiring reexploration.  Surgical site healing nicely sternal precautions.  Maintain on Lovenox for DVT prophylaxis venous Doppler studies negative currently on low-dose aspirin therapy followed by heart failure team considering plan for the addition of Plavix.  Oxycodone added for breakthrough pain.  Hospital course delirium with trial of Aricept that was later discontinued and follow-up per both psychiatry services and neurology question competency.  CT/MRI followed up showed 5 separate subcentimeter foci of restricted diffusion within the left corona radiata and periatrial white matter consistent with acute infarct.  7 mm lesion in the posterior left corona radiata demonstrating peripheral diffusion signal and central T2 signal consistent with subacute insult.  Moderate confluent periventricular and scattered subcortical T2 hyperintensities bilaterally somewhat more prominent on the left.  Vascular surgery follow-up for noted internal carotid artery stenosis plan CEA versus TCAR.  Currently maintained on low-dose aspirin awaiting plan after carotid intervention for possible need for DOAC.  Neuropsychology continue to follow.  Remeron was added for mood stabilization however later discontinued due to orthostasis.  Trial of BuSpar with little effect and discontinued.  Acute blood loss anemia no bleeding episodes.  Hospital course COVID-19 positive completed Paxlovid remained afebrile.  Findings of carotid artery stenosis during workup for CABG  with 80 to 99% left ICA stenosis reviewed by vascular surgery plan for left carotid enterectomy 04/25/2023 and IV heparin has since been administered and initiated.  History of VT PERI CABG amiodarone 200 mg daily as well as Lanoxin.  Diastolic congestive heart failure with diuresis as advised followed by heart failure team exhibiting no signs of fluid overload and continued with Demadex as well as Aldactone.  He did have a small sacral wound follow-up wound care nurse covered with foam dressing.  Noted dysphagia currently  on dysphagia #3 thin liquid diet and advance diet as tolerated.   Blood pressures were monitored on TID basis and remained soft and monitored   Rehab course: During patient's stay in rehab weekly team conferences were held to monitor patient's progress, set goals and discuss barriers to discharge. At admission, patient required min mod assist 6 feet rolling walker mod max assist sit to stand  Physical exam.  Blood pressure 123/71 pulse 63 temperature 96.2 respirations 18 oxygen saturation 97% room air Constitutional.  No acute distress HEENT Head.  Normocephalic and atraumatic Eyes.  Pupils round and reactive to light no discharge without nystagmus Neck.  Supple nontender no JVD without thyromegaly Cardiac regular rate and rhythm without any extra sounds or murmur heard Abdomen.  Soft nontender positive bowel sounds without rebound Respiratory effort normal no respiratory distress without wheeze Skin.  Midline chest incision site clean and dry.  Harvest vein site lower extremity clean and dry Neurologic.  Patient alert makes eye contact with examiner.  He does follow simple commands.  Answers basic questions as far as name and age but very limited medical historian.  He needed some cues for place and situation.  He/She  has had improvement in activity tolerance, balance, postural control as well as ability to compensate for deficits. He/She has had improvement in functional use  RUE/LUE  and RLE/LLE as well as improvement in awareness stand step to wheelchair contact-guard and cues for sequencing.  Ambulates 225 feet with rest breaks as needed.  Emphasizes optimal sequencing of sit to stand transfers and patient is able to complete with close supervision and no physical assistance.  Requiring supervision for upper body ADLs mod assist for lower body and mod assist for toileting task.  With upper extremity pushing up he is able to complete the majority of his transfers at min mod assist level.  Speech therapy follow-up patient oriented to date and day of the week utilizing external aids and minimal assist.  When given encouragement and max cues patient recalls events taking place during therapy sessions with reliance upon external memory book aid for accuracy.  Full family teaching completed and due to patient decreased functional mobility family did not feel they can provide the necessary supervision and plan was for skilled nursing facility placement after repair left carotid Endarterectomy       Disposition:  Discharge disposition: [facility] Not Defined        Diet: Regular  Special Instructions: No driving smoking or alcohol  Medications at discharge 1.  Amiodarone 200 mg p.o.  daily 2.  Vitamin C 500 mg p.o. daily 3.  Aspirin 81 mg p.o. daily 4.  Lipitor 80 mg p.o. daily 5.  Lanoxin 0.125 mg p.o. daily 6.  Colace 100 mg twice daily 7.  Multivitamin daily 8.  Atarax 25 mg 3 times daily as needed anxiety 9.  IV heparin per pharmacy protocol 10.  Protonix 40 mg twice daily   30-35 minutes were spent completing discharge summary and discharge planning  Discharge Instructions     Ambulatory referral to Cardiology   Complete by: As directed    Cardiomyopathy and CHF seen in the hospital. Needs follow up. Ideally heart failure team. Thanks   Ambulatory referral to Neurology   Complete by: As directed    Follow up with stroke  clinic NP at [facility] in about 4 weeks. Thanks.        Follow-up Information     Angelina Sheriff, DO  Follow up.   Specialty: Physical Medicine and Rehabilitation Why: No formal follow-up needed Contact information: [address] [place] [address] [phone]         Bensimhon, Bevelyn Buckles, MD Follow up.   Specialty: Cardiology Why: Call for appointment Contact information: [address] [place] [address] [phone]         [name], MD Follow up.   Specialty: Cardiothoracic Surgery Why: Call for appointment Contact information: [address] [place] [address] [phone]         Leonie Douglas, MD .   Specialties: Vascular Surgery, Interventional Cardiology Contact information: [address] [place] [address] [phone]         [facility]. Schedule an appointment as soon as possible for a visit in 1 month(s).   Specialty: Neurology Why: stroke clinic Contact information: [address] [place] [address] [phone]                Signed: Charlton Amor 04/25/2023, 5:02 AM

## 2023-03-20 NOTE — Progress Notes (Signed)
 Cornelio Bouchard, MD  Physician Physical Medicine and Rehabilitation   PMR Pre-admission     Signed   Date of Service: 03/20/2023 11:24 AM  Related encounter: Admission (Discharged) from 02/14/2023 in [facility] 4E CV SURGICAL PROGRESSIVE CARE   Signed     Expand All Collapse All  PMR Admission Coordinator Pre-Admission Assessment   Patient: Walter Vargas is an 78 y.o., male MRN: [id number] DOB: 04-04-1945 Height: 5' 8 (172.7 cm) Weight: 62.8 kg   Insurance Information HMO: yes    PPO:      PCP:      IPA:      80/20:      OTHER:  PRIMARY: U[employer] Medicare      Policy#: [id number]      Subscriber: pt CM Name: Brittany      Phone#: [phone]     Fax#: [phone] Pre-Cert#: [id number] auth after peer to peer, confirmed by Brittany with H&CC, updates due to fax listed above on  03/26/23     Employer:  Benefits:  Phone #: [phone]     Name:  Eff. Date: 03/14/23     Deduct: $257 ($0 met)      Out of Pocket Max: [phone] ($0 met)      Life Max: n/a CIR: $1610      SNF: 20 full days Outpatient: 80%     Co-Pay: 20% Home Health: 100%      Co-Pay: DME: 80%     Co-Pay: 20% Providers:  SECONDARY:       Policy#:      Phone#:    Artist:       Phone#:    The "Data Collection Information Summary" for patients in Inpatient Rehabilitation Facilities with attached "Privacy Act Statement-Health Care Records" was provided and verbally reviewed with: Patient and Family   Emergency Contact Information Contact Information       Name Relation Home Work Mobile    Josiah, Nieto [place]) Son     [phone]    [facility] Sister     [phone]    Jackquline Rue Daughter     [phone]         Other Contacts       Name Relation Home Work Mobile    Aleda Domino Friend     [phone]           Current Medical History  Patient Admitting Diagnosis: cardiac debility    History of Present Illness: Walter Vargas is a 78 year old right-handed male with PMH of HTN, 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  Presented 02/10/2023 with increasing shortness of breath over the last few months as well as chest tightness and abdominal discomfort.  Family had noted some recent AMS.  In the ED, BNP was 4500 high-sensitivity troponin 13,600-13,871.  EKG revealed sinus rhythm at 96 bpm with LVH and secondary repolarization abnormality.  Cranial CT scan negative for acute changes.  CT of the chest was notable for pulmonary edema and small pleural effusions.  He was given IV Lasix  and IV heparin  and cardiology services consulted.  CT of the abdomen pelvis showed a distended gallbladder but otherwise unremarkable.  A subsequent HIDA scan ruled out cholecystitis.  He had catheterization completed showing left ventricular ejection fraction of less than 20% with global hypokinesis and mild concentric LV hypertrophy.  RV function was moderately reduced.  There was moderate mitral insufficiency and moderate tricuspid insufficiency.  No significant AI or AS.  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  Vascular surgery follow-up as an outpatient recommended. Initial plan for surgical intervention was delayed due to COVID-19 infection, treatment has been completed.  Patient underwent insertion of Impella ventricular assistive device 03/01/2023 per Dr. 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 Kerrin, complicated by postoperative bleeding with reexploration 03/02/2023.  Pt did require MV post op. Postoperative chest tubes have since been removed.  Impella removed 12/30.  Hospital course placed on Lovenox  03/07/2023 for DVT prophylaxis venous doppler negative.  Acute blood loss anemia 9.5 after transfusion receiving 4 units FFP 2 units cryo and 2 platelets and monitored.  Initially placed on Zosyn  12/25 for low-grade fever suspect aspiration pneumonia.  Develop some cellulitis from right vein harvest  site after CABG, completing course of Ancef .  Bouts of agitation and restlessness placed on Seroquel .  He is currently on dysphagia #2 thin liquid diet.  Therapy evaluations were completed and pt was recommended for a comprehensive rehab program.    Patient's medical record from [facility] has been reviewed by the rehabilitation admission coordinator and physician.   Past Medical History      Past Medical History:  Diagnosis Date   Essential hypertension     HFrEF (heart failure with reduced ejection fraction) (HCC)     Mitral regurgitation            Has the patient had major surgery during 100 days prior to admission? Yes   Family History   family history includes Alcohol abuse in his brother and father; Diabetes in his mother; Drug abuse in his brother; Heart disease in his brother; Hypertension in his father.   Current Medications  Current Medications    Current Facility-Administered Medications:    0.9 %  sodium chloride  infusion (Manually program via Guardrails IV Fluids), , Intra-arterial, PRN, Hendrickson, Steven C, MD   acetaminophen  (TYLENOL ) 160 MG/5ML solution 650 mg, 650 mg, Oral, Q4H PRN, Hendrickson, Steven C, MD, 650 mg at 03/12/23 1806   amiodarone  (PACERONE ) tablet 200 mg, 200 mg, Per Tube, BID, Bitonti, Michael T, RPH, 200 mg at 03/20/23 9143   ascorbic acid  (VITAMIN C ) tablet 500 mg, 500 mg, Per Tube, Daily, Kerrin Elspeth BROCKS, MD, 500 mg at 03/20/23 9143   aspirin  chewable tablet 81 mg, 81 mg, Per Tube, Daily, Bitonti, Michael T, RPH, 81 mg at 03/20/23 9144   atorvastatin  (LIPITOR ) tablet 80 mg, 80 mg, Per Tube, Daily, Bitonti, Michael T, RPH, 80 mg at 03/20/23 9143   ceFAZolin  (ANCEF ) IVPB 2g/100 mL premix, 2 g, Intravenous, Q8H, Kerrin Elspeth BROCKS, MD, Stopped at 03/20/23 0602   Chlorhexidine  Gluconate Cloth 2 % PADS 6 each, 6 each, Topical, Daily, Hendrickson, Steven C, MD, 6 each at 03/20/23 0857   dapagliflozin  propanediol (FARXIGA ) tablet 10 mg, 10  mg, Oral, Daily, McLean, Dalton S, MD, 10 mg at 03/20/23 9143   digoxin  (LANOXIN ) tablet 0.125 mg, 0.125 mg, Per Tube, Daily, Bitonti, Michael T, RPH, 0.125 mg at 03/20/23 9143   diphenhydrAMINE  (BENADRYL ) 12.5 MG/5ML elixir 25 mg, 25 mg, Oral, QHS PRN, Hendrickson, Steven C, MD, 25 mg at 03/19/23 2311   docusate (COLACE) 50 MG/5ML liquid 200 mg, 200 mg, Per Tube, Daily, Bitonti, Michael T, RPH, 200 mg at 03/20/23 0855   enoxaparin  (LOVENOX ) injection 40 mg, 40 mg, Subcutaneous, Q24H, Hendrickson, Steven C, MD, 40 mg at 03/20/23 0856   feeding supplement (ENSURE ENLIVE / ENSURE PLUS) liquid 237 mL, 237 mL, Oral,  TID BM, Obadiah Coy, MD, 237 mL at 03/20/23 0857   feeding supplement (PROSource TF20) liquid 60 mL, 60 mL, Per Tube, Daily, Kerrin Elspeth BROCKS, MD, 60 mL at 03/20/23 0856   feeding supplement (VITAL 1.5 CAL) liquid 660 mL, 660 mL, Per Tube, Q24H, Kerrin Elspeth BROCKS, MD, Infusion Verify at 03/20/23 [phone]   furosemide  (LASIX ) tablet 20 mg, 20 mg, Oral, Daily, McLean, Dalton S, MD, 20 mg at 03/20/23 9143   Gerhardt's butt cream, , Topical, Daily, Kerrin Elspeth BROCKS, MD, Given at 03/20/23 0856   guaiFENesin  (ROBITUSSIN) 100 MG/5ML liquid 15 mL, 15 mL, Per Tube, Q6H, Bitonti, Michael T, RPH, 15 mL at 03/20/23 0856   insulin  aspart (novoLOG ) injection 0-9 Units, 0-9 Units, Subcutaneous, TID WC, Hendrickson, Steven C, MD, 1 Units at 03/20/23 9348   losartan  (COZAAR ) tablet 25 mg, 25 mg, Oral, Daily, Sabharwal, Aditya, DO, 25 mg at 03/20/23 9143   ondansetron  (ZOFRAN ) injection 4 mg, 4 mg, Intravenous, Q6H PRN, Kerrin Elspeth BROCKS, MD   Oral care mouth rinse, 15 mL, Mouth Rinse, 4 times per day, Kerrin Elspeth BROCKS, MD, 15 mL at 03/20/23 0857   Oral care mouth rinse, 15 mL, Mouth Rinse, PRN, Kerrin Elspeth BROCKS, MD   oxyCODONE  (ROXICODONE ) 5 MG/5ML solution 5 mg, 5 mg, Oral, Q4H PRN, Hendrickson, Steven C, MD   pantoprazole  (PROTONIX ) EC tablet 40 mg, 40 mg, Oral, Daily, Diaz,  Alma L, NP, 40 mg at 03/20/23 9143   QUEtiapine  (SEROQUEL ) tablet 25 mg, 25 mg, Per Tube, Daily, Barrett, Erin R, PA-C, 25 mg at 03/20/23 0856   QUEtiapine  (SEROQUEL ) tablet 50 mg, 50 mg, Oral, QHS, Barrett, Erin R, PA-C   sodium chloride  (OCEAN) 0.65 % nasal spray 1 spray, 1 spray, Each Nare, PRN, Kerrin Elspeth BROCKS, MD   sodium chloride  flush (NS) 0.9 % injection 10-40 mL, 10-40 mL, Intracatheter, Q12H, Kerrin Elspeth BROCKS, MD, 10 mL at 03/20/23 0857   sodium chloride  flush (NS) 0.9 % injection 10-40 mL, 10-40 mL, Intracatheter, PRN, Kerrin Elspeth BROCKS, MD   [START ON 03/21/2023] spironolactone  (ALDACTONE ) tablet 25 mg, 25 mg, Oral, Daily, Colletta Manuelita Garre, PA-C   [EXPIRED] thiamine  (VITAMIN B1) 500 mg in sodium chloride  0.9 % 50 mL IVPB, 500 mg, Intravenous, Q8H, Stopped at 03/13/23 2219 **FOLLOWED BY** thiamine  (VITAMIN B1) tablet 100 mg, 100 mg, Oral, Daily, Kerrin Elspeth BROCKS, MD, 100 mg at 03/20/23 9143   zinc  sulfate (50mg  elemental zinc ) capsule 220 mg, 220 mg, Per Tube, Daily, Kerrin Elspeth BROCKS, MD, 220 mg at 03/20/23 9144     Patients Current Diet:  Diet Order                  DIET DYS 2 Room service appropriate? Yes; Fluid consistency: Thin  Diet effective now                         Precautions / Restrictions Precautions Precautions: Fall, Sternal, Other (comment) Precaution Booklet Issued: No Precaution Comments: cortrak Restrictions Weight Bearing Restrictions Per Provider Order: Yes RUE Weight Bearing Per Provider Order: Non weight bearing LUE Weight Bearing Per Provider Order: Non weight bearing Other Position/Activity Restrictions: sternal precautions    Has the patient had 2 or more falls or a fall with injury in the past year? No   Prior Activity Level Community (5-7x/wk): independent prior to admit without DME, driving   Prior Functional Level Self Care: Did the patient need help bathing, dressing, using the  toilet or eating?  Independent   Indoor Mobility: Did the patient need assistance with walking from room to room (with or without device)? Independent   Stairs: Did the patient need assistance with internal or external stairs (with or without device)? Independent   Functional Cognition: Did the patient need help planning regular tasks such as shopping or remembering to take medications? Independent   Patient Information Are you of Hispanic, Latino/a,or Spanish origin?: A. No, not of Hispanic, Latino/a, or Spanish origin What is your race?: A. White Do you need or want an interpreter to communicate with a doctor or health care staff?: 0. No   Patient's Response To:  Health Literacy and Transportation Is the patient able to respond to health literacy and transportation needs?: Yes Health Literacy - How often do you need to have someone help you when you read instructions, pamphlets, or other written material from your doctor or pharmacy?: Never In the past 12 months, has lack of transportation kept you from medical appointments or from getting medications?: No In the past 12 months, has lack of transportation kept you from meetings, work, or from getting things needed for daily living?: No   Home Assistive Devices / Equipment Home Equipment: None   Prior Device Use: Indicate devices/aids used by the patient prior to current illness, exacerbation or injury? None of the above   Current Functional Level Cognition   Overall Cognitive Status: Impaired/Different from baseline Current Attention Level: Sustained Orientation Level: Oriented to person, Oriented to place, Disoriented to time, Oriented to situation Following Commands: Follows one step commands with increased time, Follows one step commands inconsistently Safety/Judgement: Decreased awareness of safety, Decreased awareness of deficits General Comments: little to no recall of sternal precautions; requires direct, simple cues for all mobility. poor  attention and awareness, impulsive with movement and poor problem solving. pt and his vistor report he is having a bad morning and friend concerned cognition is worse today.    Extremity Assessment (includes Sensation/Coordination)   Upper Extremity Assessment: Generalized weakness  Lower Extremity Assessment: Generalized weakness     ADLs   Overall ADL's : Needs assistance/impaired Grooming: Oral care, Sitting, Set up Grooming Details (indicate cue type and reason): using suction toothbrush Lower Body Dressing: Moderate assistance, Sitting/lateral leans Lower Body Dressing Details (indicate cue type and reason): socks Toilet Transfer: Minimal assistance, Stand-pivot, Rolling walker (2 wheels) Toilet Transfer Details (indicate cue type and reason): Poor command following throughout transfer and signifciant retropulsion in standing requiring constant mod A Toileting- Clothing Manipulation and Hygiene: Total assistance, +2 for physical assistance, +2 for safety/equipment Toileting - Clothing Manipulation Details (indicate cue type and reason): posterior pericare provided by nursing staff Functional mobility during ADLs: Moderate assistance, Minimal assistance, Rolling walker (2 wheels)     Mobility   Overal bed mobility: Needs Assistance Bed Mobility: Rolling, Sidelying to Sit Rolling: Mod assist Sidelying to sit: Min assist Supine to sit: Mod assist, HOB elevated Sit to sidelying: Mod assist General bed mobility comments: pt not following directions for log roll, adamant HOB be elevated, then able to power up to long sitting without UE support, max cues to move BLEs to EOB, ultimately able to do so with CGA for cuing     Transfers   Overall transfer level: Needs assistance Equipment used: Rolling walker (2 wheels) Transfers: Sit to/from Stand Sit to Stand: Mod assist, Max assist General transfer comment: multiple sit<>stands from EOB (2x), recliner (2x) and BSC (1x) to RW, pt  requiring repeated,  max verbal cues for hand placement and sternal precautions, modA for trunk elevation with momentum to power up, mod-maxA for eccentric lower to sit secondary to significant posterior lean and difficulty following commands for hand placement; repeated cues to widen BOS     Ambulation / Gait / Stairs / Wheelchair Mobility   Ambulation/Gait Ambulation/Gait assistance: Min assist, Mod assist Gait Distance (Feet): 6 Feet Assistive device: Rolling walker (2 wheels) Gait Pattern/deviations: Step-through pattern, Decreased stride length, Leaning posteriorly, Narrow base of support, Trunk flexed General Gait Details: pt with fluctuating ability to overcome significant posterior lean and sequence steps with RW this session; initial pivotal steps from bed>recliner though pt walking forward with significant posterior lean requiring maxA to safely pivot hips back to bed to prevent fall; additional pivotal steps from bed>recliner>BSC with RW and min-modA for balance; pt then able to walk ~4' forwards/backwards with RW and minA before adamant about return to sitting (I'm done) Gait velocity: Decreased Gait velocity interpretation: 1.31 - 2.62 ft/sec, indicative of limited community ambulator     Posture / Balance Dynamic Sitting Balance Sitting balance - Comments: able to sit EOB and recliner Balance Overall balance assessment: Needs assistance Sitting-balance support: No upper extremity supported, Feet supported Sitting balance-Leahy Scale: Fair Sitting balance - Comments: able to sit EOB and recliner Postural control: Posterior lean Standing balance support: Bilateral upper extremity supported, Reliant on assistive device for balance Standing balance-Leahy Scale: Poor Standing balance comment: posterior lean, heavy reliance on UE support, dependent for posterior pericare while standing     Special needs/care consideration Skin sternal incision and Diabetic management yes     Previous Home Environment (from acute therapy documentation) Living Arrangements: Alone Available Help at Discharge: Family, Available 24 hours/day Type of Home: Apartment Home Layout: One level Home Access: Stairs to enter Entrance Stairs-Rails: Right Entrance Stairs-Number of Steps: 3 Home Care Services: No Additional Comments: Per chart review, lives alone; sister and brother-in-law (in good shape) live in [place], state they're retired and plan to stay with pt for initial 24/7 assist at d/c   Discharge Living Setting Plans for Discharge Living Setting: Patient's home, Lives with (comment) (family to come stay with him) Type of Home at Discharge: Apartment Discharge Home Layout: One level Discharge Home Access: Level entry Discharge Bathroom Shower/Tub: Tub/shower unit Discharge Bathroom Toilet: Standard Discharge Bathroom Accessibility: Yes How Accessible: Accessible via walker Does the patient have any problems obtaining your medications?: No   Social/Family/Support Systems Anticipated Caregiver: son, [name], and sister/BIL (Steffi), and dtr Denson) Anticipated Caregiver's Contact Information: [name] is primary contact [phone] Ability/Limitations of Caregiver: none stated Caregiver Availability: 24/7 Discharge Plan Discussed with Primary Caregiver: Yes Is Caregiver In Agreement with Plan?: Yes Does Caregiver/Family have Issues with Lodging/Transportation while Pt is in Rehab?: No   Goals Patient/Family Goal for Rehab: PT/OT/SLP supervision to mod I Expected length of stay: 14-18 days Additional Information: Discharge plan: family will rotate staying with the patient so he has 24/7 supervision at discharge.  Plan to d/c home, not to a facility. Pt/Family Agrees to Admission and willing to participate: Yes Program Orientation Provided & Reviewed with Pt/Caregiver Including Roles  & Responsibilities: Yes   Decrease burden of Care through IP rehab admission: n/a    Possible need for SNF placement upon discharge: Not anticipated.  Plan for discharge back to patient's home where family will rotate staying with him to ensure 24/7 coverage.    Patient Condition: I have reviewed medical records from [facility], spoken with  CM, and patient and son. I met with patient at the bedside and discussed via phone for inpatient rehabilitation assessment.  Patient will benefit from ongoing PT, OT, and SLP, can actively participate in 3 hours of therapy a day 5 days of the week, and can make measurable gains during the admission.  Patient will also benefit from the coordinated team approach during an Inpatient Acute Rehabilitation admission.  The patient will receive intensive therapy as well as Rehabilitation physician, nursing, social worker, and care management interventions.  Due to bladder management, safety, skin/wound care, disease management, medication administration, pain management, and patient education the patient requires 24 hour a day rehabilitation nursing.  The patient is currently min to mod assist with mobility and basic ADLs.  Discharge setting and therapy post discharge at home with home health is anticipated.  Patient has agreed to participate in the Acute Inpatient Rehabilitation Program and will admit today.   Preadmission Screen Completed By:  Reche FORBES Lowers, PT, DPT 03/20/2023 11:24 AM ______________________________________________________________________   Discussed status with Dr. Lovorn on 03/20/23  at 11:39 AM  and received approval for admission today.   Admission Coordinator:  Caelin Rayl E Tamaria Dunleavy, PT, DPT time 11:39 AM Pattricia 03/20/23     Assessment/Plan: Diagnosis: Debility due to CABG x5  Does the need for close, 24 hr/day Medical supervision in concert with the patient's rehab needs make it unreasonable for this patient to be served in a less intensive setting? Yes Co-Morbidities requiring supervision/potential complications: asp pneumonia- D2 thin  diet for dysphagia- cellulitis- ABLA s/p FFP and plts; delirium Due to bladder management, bowel management, safety, skin/wound care, disease management, medication administration, pain management, and patient education, does the patient require 24 hr/day rehab nursing? Yes Does the patient require coordinated care of a physician, rehab nurse, PT, OT, and SLP to address physical and functional deficits in the context of the above medical diagnosis(es)? Yes Addressing deficits in the following areas: balance, endurance, locomotion, strength, transferring, bowel/bladder control, bathing, dressing, feeding, grooming, toileting, cognition, and swallowing Can the patient actively participate in an intensive therapy program of at least 3 hrs of therapy 5 days a week? Yes The potential for patient to make measurable gains while on inpatient rehab is good Anticipated functional outcomes upon discharge from inpatient rehab: modified independent and supervision PT, modified independent and supervision OT, modified independent and supervision SLP Estimated rehab length of stay to reach the above functional goals is: 14-18 days Anticipated discharge destination: Home 10. Overall Rehab/Functional Prognosis: good     MD Signature:           Revision History

## 2023-03-20 NOTE — Progress Notes (Signed)
 CARDIAC REHAB PHASE I     Post OHS education including site care, restrictions, heart healthy diet, sternal precautions, continued  IS use, exercise guidelines and CRP2 reviewed. All questions and concerns addressed. Will refer to [facility] for CRP2. Plan for discharge to CIR today.   [id number] Vaughn Asberry Hacking, RN BSN 03/20/2023 12:08 PM

## 2023-03-20 NOTE — Discharge Instructions (Addendum)
 Inpatient Rehab Discharge Instructions  Walter Vargas Discharge date and time: No discharge date for patient encounter.   Activities/Precautions/ Functional Status: Activity: Sternal precautions Diet: Regular Wound Care: Routine skin checks Functional status:  ___ No restrictions     ___ Walk up steps independently ___ 24/7 supervision/assistance   ___ Walk up steps with assistance ___ Intermittent supervision/assistance  ___ Bathe/dress independently ___ Walk with walker     _x__ Bathe/dress with assistance ___ Walk Independently    ___ Shower independently ___ Walk with assistance    ___ Shower with assistance ___ No alcohol     ___ Return to work/school ________  Special Instructions: No driving smoking or alcohol   My questions have been answered and I understand these instructions. I will adhere to these goals and the provided educational materials after my discharge from the hospital.  Patient/Caregiver Signature _______________________________ Date __________  Clinician Signature _______________________________________ Date __________  Please bring this form and your medication list with you to all your follow-up doctor's appointments.

## 2023-03-21 DIAGNOSIS — R5381 Other malaise: Secondary | ICD-10-CM | POA: Diagnosis not present

## 2023-03-21 LAB — CBC WITH DIFFERENTIAL/PLATELET
Abs Immature Granulocytes: 0.03 10*3/uL (ref 0.00–0.07)
Basophils Absolute: 0.1 10*3/uL (ref 0.0–0.1)
Basophils Relative: 1 %
Eosinophils Absolute: 0.4 10*3/uL (ref 0.0–0.5)
Eosinophils Relative: 4 %
HCT: 33.5 % — ABNORMAL LOW (ref 39.0–52.0)
Hemoglobin: 10.5 g/dL — ABNORMAL LOW (ref 13.0–17.0)
Immature Granulocytes: 0 %
Lymphocytes Relative: 14 %
Lymphs Abs: 1.1 10*3/uL (ref 0.7–4.0)
MCH: 28.4 pg (ref 26.0–34.0)
MCHC: 31.3 g/dL (ref 30.0–36.0)
MCV: 90.5 fL (ref 80.0–100.0)
Monocytes Absolute: 1.2 10*3/uL — ABNORMAL HIGH (ref 0.1–1.0)
Monocytes Relative: 15 %
Neutro Abs: 5.3 10*3/uL (ref 1.7–7.7)
Neutrophils Relative %: 66 %
Platelets: 698 10*3/uL — ABNORMAL HIGH (ref 150–400)
RBC: 3.7 MIL/uL — ABNORMAL LOW (ref 4.22–5.81)
RDW: 17.8 % — ABNORMAL HIGH (ref 11.5–15.5)
WBC: 8 10*3/uL (ref 4.0–10.5)
nRBC: 0 % (ref 0.0–0.2)

## 2023-03-21 LAB — COMPREHENSIVE METABOLIC PANEL
ALT: 18 U/L (ref 0–44)
AST: 33 U/L (ref 15–41)
Albumin: 2.5 g/dL — ABNORMAL LOW (ref 3.5–5.0)
Alkaline Phosphatase: 112 U/L (ref 38–126)
Anion gap: 12 (ref 5–15)
BUN: 37 mg/dL — ABNORMAL HIGH (ref 8–23)
CO2: 19 mmol/L — ABNORMAL LOW (ref 22–32)
Calcium: 8.4 mg/dL — ABNORMAL LOW (ref 8.9–10.3)
Chloride: 99 mmol/L (ref 98–111)
Creatinine, Ser: 0.88 mg/dL (ref 0.61–1.24)
GFR, Estimated: >60 mL/min (ref >60)
Glucose, Bld: 115 mg/dL — ABNORMAL HIGH (ref 70–99)
Potassium: 4.6 mmol/L (ref 3.5–5.1)
Sodium: 130 mmol/L — ABNORMAL LOW (ref 135–145)
Total Bilirubin: 1.1 mg/dL (ref 0.0–1.2)
Total Protein: 6.4 g/dL — ABNORMAL LOW (ref 6.5–8.1)

## 2023-03-21 MED ORDER — ALTEPLASE 2 MG IJ SOLR
2.0000 mg | Freq: Once | INTRAMUSCULAR | Status: AC
  Administered 2023-03-21: 2 mg
  Filled 2023-03-21: qty 2

## 2023-03-21 MED ORDER — AMOXICILLIN-POT CLAVULANATE 400-57 MG/5ML PO SUSR
875.0000 mg | Freq: Two times a day (BID) | ORAL | Status: DC
  Administered 2023-03-22 – 2023-03-23 (×4): 875 mg
  Filled 2023-03-21 (×5): qty 10.9

## 2023-03-21 MED ORDER — FUROSEMIDE 40 MG PO TABS
40.0000 mg | ORAL_TABLET | Freq: Every day | ORAL | Status: DC
  Administered 2023-03-22: 40 mg via ORAL
  Filled 2023-03-21: qty 1

## 2023-03-21 MED ORDER — AMIODARONE HCL 200 MG PO TABS
200.0000 mg | ORAL_TABLET | Freq: Every day | ORAL | Status: DC
  Administered 2023-03-27 – 2023-04-24 (×29): 200 mg via ORAL
  Filled 2023-03-21 (×29): qty 1

## 2023-03-21 NOTE — Progress Notes (Addendum)
 PROGRESS NOTE   Subjective/Complaints:  No events overnight.  No acute complaints.  Slept well, despite frequent interruptions per staff and labs. Vitals stable AM labs with hyponatremia 130, BUN 37 with normal Cr, albumin  2.5, and stable anemia and thrombocytosis. BG well controlled.  Last BM 2 days prior per patient, no concerns for constipation  ROS: Denies fevers, chills, N/V, abdominal pain, constipation, diarrhea, SOB, cough, chest pain, new weakness or paraesthesias.    Objective:   DG Swallowing Func-Speech Pathology Result Date: 03/20/2023 Table formatting from the original result was not included. Modified Barium Swallow Study Patient Details Name: Walter Vargas MRN: [id number] Date of Birth: [birth year] Today's Date: 03/20/2023 HPI/PMH: HPI: 78 year old male with chronic HFrEF was initially admitted 11/30 with acute NSTEMI and acute on chronic biventricular HFrEF with moderate to severe MR, underwent CABG x  5 12/19, post-procedure complicated with cardiac stunning and failure to wean from bypass s/p with placement of Impella 5.5. 12/20 back to OR for re-exploration d/t post-op bleeding/ABLA.  Intubated 12/19-20. Cortrak 12/20. Reintubated for procedure only 12/30. Per RN family reports baseline dysphagia. MBS 1/1 with compromised airway protection and efficiency. Dysphagia 1/honey thick liquids recommended. Clinical Impression: Clinical Impression: Improved oropharyngeal swallow as compared to study on 03/14/23. Patient alert and cooperative, required cueing to to some continued confusion but able to self feed today and follow directions to complete compensatory strategies. Orally, patient able to transit all boluses provided with some mildly prolonged mastication of regular texture bolus. He continues to have reduced hyolaryngeal movement, laryngeal vestibule closure, pharyngeal squeeze, epiglottic deflection,  and base of tongue  retraction but all appearing improved somewhat from previous study. Also note posterior curvative of C spine at C4-5/6 which combined with decreased epiglottic deflection and decreased UES opening is likely impacting full tight closure of laryngeal vestibule during the swallow. The combination of above reuslt in penetration of thin and nectar thick liquids however penetrates remained above the airway and were consistently cleared with either spontaneous dry swallows or cued throat clear and swallow.  Moderate vallecular and pyriform sinus residuals noted post swallows with solids and nectar thick liquids, reduced to mild-moderate with thin liquids. Presence of esophageal stasis noted below UES tomid esophagus could also be contributing to increased pressure and attributing to decreased UES opening and the presence of pharyngeal residue post swallow. Recommend diet upgrade to dysphagia 2 with thin liquids with strict use of precautions. Did not provide pill today during study based on degree of residue with other boluses, Recommend crushing and providing with a pureed solid. SLP will f/u. Factors that may increase risk of adverse event in presence of aspiration [name] & [name] 2021): Factors that may increase risk of adverse event in presence of aspiration [name] & [name] 2021): Poor general health and/or compromised immunity; Reduced cognitive function; Frail or deconditioned; Dependence for feeding and/or oral hygiene; Weak cough; Presence of tubes (ETT, trach, NG, etc.) Recommendations/Plan: Swallowing Evaluation Recommendations Swallowing Evaluation Recommendations Recommendations: PO diet PO Diet Recommendation: Dysphagia 2 (Finely chopped); Thin liquids (Level 0) Liquid Administration via: Cup; No straw Medication Administration: Crushed with puree Supervision: Patient able to self-feed; Staff to assist with self-feeding; Full supervision/cueing  for swallowing strategies Swallowing strategies  : Slow rate;  Small bites/sips; Clear throat intermittently; Multiple dry swallows after each bite/sip Postural changes: Position pt fully upright for meals; Stay upright 30-60 min after meals Oral care recommendations: Oral care BID (2x/day) Treatment Plan Treatment Plan Treatment recommendations: Therapy as outlined in treatment plan below Follow-up recommendations: Acute inpatient rehab (3 hours/day) Functional status assessment: Patient has had a recent decline in their functional status and demonstrates the ability to make significant improvements in function in a reasonable and predictable amount of time. Treatment frequency: Min 2x/week Treatment duration: 2 weeks Interventions: Aspiration precaution training; Oropharyngeal exercises; Compensatory techniques; Patient/family education; Trials of upgraded texture/liquids; Diet toleration management by SLP; Respiratory muscle strength training Recommendations Recommendations for follow up therapy are one component of a multi-disciplinary discharge planning process, led by the attending physician.  Recommendations may be updated based on patient status, additional functional criteria and insurance authorization. Assessment: Orofacial Exam: Orofacial Exam Oral Cavity: Oral Hygiene: Xerostomia (self reported) Oral Cavity - Dentition: Missing dentition Anatomy: Anatomy: Prominent cricopharyngeus (posterior curvative of C-spine) Boluses Administered: Boluses Administered Boluses Administered: Thin liquids (Level 0); Mildly thick liquids (Level 2, nectar thick); Puree; Solid  Oral Impairment Domain: Oral Impairment Domain Lip Closure: No labial escape Tongue control during bolus hold: Posterior escape of greater than half of bolus Bolus preparation/mastication: Slow prolonged chewing/mashing with complete recollection Bolus transport/lingual motion: Delayed initiation of tongue motion (oral holding) Oral residue: Complete oral clearance Location of oral residue : N/A Initiation of  pharyngeal swallow : Valleculae  Pharyngeal Impairment Domain: Pharyngeal Impairment Domain Soft palate elevation: No bolus between soft palate (SP)/pharyngeal wall (PW) Laryngeal elevation: Partial superior movement of thyroid cartilage/partial approximation of arytenoids to epiglottic petiole Anterior hyoid excursion: Partial anterior movement Epiglottic movement: Partial inversion Laryngeal vestibule closure: Incomplete, narrow column air/contrast in laryngeal vestibule Pharyngeal stripping wave : Present - diminished Pharyngeal contraction (A/P view only): N/A Pharyngoesophageal segment opening: Partial distention/partial duration, partial obstruction of flow Tongue base retraction: Trace column of contrast or air between tongue base and PPW Pharyngeal residue: Collection of residue within or on pharyngeal structures Location of pharyngeal residue: Valleculae; Pyriform sinuses  Esophageal Impairment Domain: Esophageal Impairment Domain Esophageal clearance upright position: Esophageal retention Pill: Pill Consistency administered: -- (n/a) Penetration/Aspiration Scale Score: Penetration/Aspiration Scale Score 1.  Material does not enter airway: Puree; Solid 3.  Material enters airway, remains ABOVE vocal cords and not ejected out: Thin liquids (Level 0); Mildly thick liquids (Level 2, nectar thick) Compensatory Strategies: Compensatory Strategies Compensatory strategies: Yes Multiple swallows: Effective Effective Multiple Swallows: Mildly thick liquid (Level 2, nectar thick); Thin liquid (Level 0); Puree; Solid Chin tuck: Ineffective Ineffective Chin Tuck: Mildly thick liquid (Level 2, nectar thick) Liquid wash: Effective Effective Liquid Wash: Solid; Puree   General Information: Caregiver present: No  Diet Prior to this Study: Dysphagia 1 (pureed); Moderately thick liquids (Level 3, honey thick)   Temperature : Normal   Respiratory Status: WFL   Supplemental O2: None (Room air)   History of Recent Intubation:  Yes  Behavior/Cognition: Alert; Cooperative; Confused; Distractible; Requires cueing Self-Feeding Abilities: Able to self-feed Baseline vocal quality/speech: Normal Volitional Cough: Able to elicit Volitional Swallow: Able to elicit No data recorded Goal Planning: Prognosis for improved oropharyngeal function: Good Barriers to Reach Goals: Cognitive deficits; Time post onset No data recorded No data recorded Consulted and agree with results and recommendations: Patient; Nurse Pain: Pain Assessment Pain Assessment: No/denies pain Faces Pain Scale: 2 Pain Location: generalized (  does not specify) Pain Descriptors / Indicators: Discomfort; Grimacing Pain Intervention(s): Monitored during session End of Session: Start Time:SLP Start Time (ACUTE ONLY): 0940 Stop Time: SLP Stop Time (ACUTE ONLY): 1015 Time Calculation:SLP Time Calculation (min) (ACUTE ONLY): 35 min Charges: SLP Evaluations $ SLP Speech Visit: 1 Visit SLP Evaluations $MBS Swallow: 1 Procedure $Swallowing Treatment: 1 Procedure SLP visit diagnosis: SLP Visit Diagnosis: Dysphagia, oropharyngeal phase (R13.12) Past Medical History: Past Medical History: Diagnosis Date  Essential hypertension   HFrEF (heart failure with reduced ejection fraction) (HCC)   Mitral regurgitation  Past Surgical History: Past Surgical History: Procedure Laterality Date  CORONARY ARTERY BYPASS GRAFT N/A 03/01/2023  Procedure: CORONARY ARTERY BYPASS GRAFTING X 5, USING LEFT INTERNAL MAMMARY ARTERY AND ENDOSCOPICALLY HARVESTED RIGHT SAPHENOUS VEIN GRAFT;  Surgeon: Kerrin Elspeth BROCKS, MD;  Location: [facility] OR;  Service: Open Heart Surgery;  Laterality: N/A;  EXPLORATION POST OPERATIVE OPEN HEART N/A 03/01/2023  Procedure: EXPLORATION POST OPERATIVE OPEN HEART;  Surgeon: Kerrin Elspeth BROCKS, MD;  Location: [facility] OR;  Service: Open Heart Surgery;  Laterality: N/A;  NO PAST SURGERIES    PLACEMENT OF IMPELLA LEFT VENTRICULAR ASSIST DEVICE  03/01/2023  Procedure: PLACEMENT OF IMPELLA LEFT  VENTRICULAR ASSIST DEVICE;  Surgeon: Kerrin Elspeth BROCKS, MD;  Location: [facility] OR;  Service: Open Heart Surgery;;  REMOVAL OF IMPELLA LEFT VENTRICULAR ASSIST DEVICE N/A 03/12/2023  Procedure: REMOVAL OF IMPELLA LEFT VENTRICULAR ASSIST DEVICE;  Surgeon: Kerrin Elspeth BROCKS, MD;  Location: [facility] OR;  Service: Open Heart Surgery;  Laterality: N/A;  RIGHT/LEFT HEART CATH AND CORONARY ANGIOGRAPHY N/A 02/13/2023  Procedure: RIGHT/LEFT HEART CATH AND CORONARY ANGIOGRAPHY;  Surgeon: Mady Bruckner, MD;  Location: [facility] INVASIVE CV LAB;  Service: Cardiovascular;  Laterality: N/A;  TEE WITHOUT CARDIOVERSION N/A 03/01/2023  Procedure: TRANSESOPHAGEAL ECHOCARDIOGRAM (TEE);  Surgeon: Kerrin Elspeth BROCKS, MD;  Location: [facility] OR;  Service: Open Heart Surgery;  Laterality: N/A;  TEE WITHOUT CARDIOVERSION N/A 03/12/2023  Procedure: TRANSESOPHAGEAL ECHOCARDIOGRAM (TEE);  Surgeon: Kerrin Elspeth BROCKS, MD;  Location: [facility] OR;  Service: Open Heart Surgery;  Laterality: N/A; Rea Pass MA, CCC-SLP McCoy Leah Meryl 03/20/2023, 11:29 AM  DG Chest Port 1 View Result Date: 03/20/2023 CLINICAL DATA:  Pleural effusion, cough. EXAM: PORTABLE CHEST 1 VIEW COMPARISON:  March 16, 2023. FINDINGS: Stable cardiomediastinal silhouette. Stable bibasilar atelectasis and effusions, right greater than left. Feeding tube is seen entering stomach. IMPRESSION: Stable bibasilar atelectasis and pleural effusions, right greater than left. Electronically Signed   By: Lynwood Landy Raddle M.D.   On: 03/20/2023 09:41   VAS US  LOWER EXTREMITY ARTERIAL DUPLEX Result Date: 03/19/2023 LOWER EXTREMITY ARTERIAL DUPLEX STUDY Patient Name:  KELDRICK POMPLUN  Date of Exam:   03/19/2023 Medical Rec #: [id number]      Accession #:    [id number] Date of Birth: 1945-04-06      Patient Gender: M Patient Age:   2 years Exam Location:  [facility] Procedure:      VAS US  LOWER EXTREMITY ARTERIAL DUPLEX Referring Phys: RIA COMMANDER  --------------------------------------------------------------------------------  Indications: Peripheral artery disease (left carotid artery stenosis 80-99%),              and Patient status post CABG and Impella. Some difficulty finding              pulses at times and large errythemous area medial calf near site of              GSV harvest, question cellulitis. High Risk Factors: Hypertension, past history  of smoking, prior MI, coronary                    artery disease. Other Factors: Recent Covid 19 infection, Heart failure with reduced EF.  Current ABI: N/A Limitations: Altered mental status, movement, body habitus Comparison Study: No prior study on file Performing Technologist: Alberta Lis RVS  Examination Guidelines: A complete evaluation includes B-mode imaging, spectral Doppler, color Doppler, and power Doppler as needed of all accessible portions of each vessel. Bilateral testing is considered an integral part of a complete examination. Limited examinations for reoccurring indications may be performed as noted.  +----------+--------+-----+--------+-----------+---------------+ RIGHT     PSV cm/sRatioStenosisWaveform   Comments        +----------+--------+-----+--------+-----------+---------------+ CFA Prox  98                   multiphasic                +----------+--------+-----+--------+-----------+---------------+ DFA       42                   multiphasic                +----------+--------+-----+--------+-----------+---------------+ SFA Prox  47                   multiphasic                +----------+--------+-----+--------+-----------+---------------+ SFA Mid   56                   multiphasic                +----------+--------+-----+--------+-----------+---------------+ SFA Distal102                  multiphasic                +----------+--------+-----+--------+-----------+---------------+ POP Prox  43                   multiphasic                 +----------+--------+-----+--------+-----------+---------------+ POP Mid   89                   multiphasic                +----------+--------+-----+--------+-----------+---------------+ POP Distal82                   monophasic                 +----------+--------+-----+--------+-----------+---------------+ ATA Distal50                   monophasic                 +----------+--------+-----+--------+-----------+---------------+ PTA Prox  40                   monophasic                 +----------+--------+-----+--------+-----------+---------------+ PTA Mid   22                   monophasic collateral flow +----------+--------+-----+--------+-----------+---------------+ PTA Distal17                   monophasic                 +----------+--------+-----+--------+-----------+---------------+ Complex fluid filled area noted from proximal to distal calf near site of GSV harvest and  area of errythema, no vascularization noted.  +-----------+--------+-----+--------+--------------+-----------+ LEFT       PSV cm/sRatioStenosisWaveform      Comments    +-----------+--------+-----+--------+--------------+-----------+ CFA Prox   44                   biphasic                  +-----------+--------+-----+--------+--------------+-----------+ DFA        28                   biphasic                  +-----------+--------+-----+--------+--------------+-----------+ SFA Prox   38                   biphasic                  +-----------+--------+-----+--------+--------------+-----------+ SFA Mid    49                   biphasic                  +-----------+--------+-----+--------+--------------+-----------+ SFA Distal 33                   biphasic                  +-----------+--------+-----+--------+--------------+-----------+ POP Prox   33                   biphasic                   +-----------+--------+-----+--------+--------------+-----------+ POP Mid    43                   biphasic                  +-----------+--------+-----+--------+--------------+-----------+ POP Distal 25                   biphasic                  +-----------+--------+-----+--------+--------------+-----------+ TP Trunk   67                   biphasic                  +-----------+--------+-----+--------+--------------+-----------+ ATA Prox                occluded                          +-----------+--------+-----+--------+--------------+-----------+ ATA Mid                 occluded                          +-----------+--------+-----+--------+--------------+-----------+ ATA Distal              occluded                          +-----------+--------+-----+--------+--------------+-----------+ PTA Prox                        not visualized            +-----------+--------+-----+--------+--------------+-----------+ PTA Mid    37  monophasic    collaterals +-----------+--------+-----+--------+--------------+-----------+ PTA Distal 41                   monophasic    collaterals +-----------+--------+-----+--------+--------------+-----------+ PERO Prox                       not visualized            +-----------+--------+-----+--------+--------------+-----------+ PERO Mid                        not visualized            +-----------+--------+-----+--------+--------------+-----------+ PERO Distal15                   monophasic                +-----------+--------+-----+--------+--------------+-----------+  Summary: Right: Diffuse calcific plaque throughout the right lower extremity arterial system. No significant areas of stenosis noted. Collateral flow noted in the mid calf at the PTA. Complex fluid filled area noted from proximal to distal calf near site of GSV harvest and area of errythema, no vascularization noted.  Left: Diffuse calcific plaque noted throughout the left lower extremity. No significant areas of stenosis noted. The anterior tibial artery appears occluded. Collateral flow noted mid to distal calf at PTA.  See table(s) above for measurements and observations. Electronically signed by Lonni Gaskins MD on 03/19/2023 at 1:53:10 PM.    Final    Recent Labs    03/19/23 0502 03/21/23 0511  WBC 7.9 8.0  HGB 9.5* 10.5*  HCT 30.4* 33.5*  PLT 666* 698*   Recent Labs    03/20/23 0330 03/21/23 0511  NA 131* 130*  K 4.6 4.6  CL 99 99  CO2 23 19*  GLUCOSE 128* 115*  BUN 37* 37*  CREATININE 0.79 0.88  CALCIUM  8.4* 8.4*    Intake/Output Summary (Last 24 hours) at 03/21/2023 0814 Last data filed at 03/21/2023 0700 Gross per 24 hour  Intake 220 ml  Output 350 ml  Net -130 ml     Pressure Injury 03/15/23 Coccyx Mid;Lower Stage 2 -  Partial thickness loss of dermis presenting as a shallow open injury with a red, pink wound bed without slough. (Active)  03/15/23 0800  Location: Coccyx  Location Orientation: Mid;Lower  Staging: Stage 2 -  Partial thickness loss of dermis presenting as a shallow open injury with a red, pink wound bed without slough.  Wound Description (Comments):   Present on Admission: No    Physical Exam: Vital Signs Blood pressure 116/60, pulse 63, temperature 97.7 F (36.5 C), temperature source Oral, resp. rate 18, weight 57.7 kg, SpO2 99%.   PE: Constitution: Appropriate appearance for age. No apparent distress.  Underweight Resp: No respiratory distress. No accessory muscle usage. on RA and CTAB.  Positive productive cough with small amount of yellow sputum. Cardio: Well perfused appearance.  Trace bilateral peripheral edema. Abdomen: Nondistended. Nontender.  + Core track Psych: Appropriate mood and affect. Skin: Bilateral upper extremity and ecchymosis, IV C/D/I, Mepilex on prior arterial line site.    Prior exams as below:  Ecchymoses in B/L UEs IV R  forearm looks OK And PICC LU upper arm looks well R inner leg- thigh and calf- a lot of bruising- dark reddish/purple/a little yellow Scabbing from vein harvest Some swelling like large blister On R inner calf with line drawn around redness    Neurologic Exam:  Awake, alert, and oriented  x 4.  Moderate memory and higher cognitive deficits.  Follows all simple commands.  No apparent impulsivity.  Language intact.  Cranial nerves II through XII intact.  Sensation intact.  No fine motor deficits.  No ataxia. Insight: Fair insight into current condition  Strength: Unchanged 1-8 Ue's 5-/5 except deltoids and biceps 4+/5 LE's HF 4+/5; otherwise 5-/5 B/L      Assessment/Plan: 1. Functional deficits which require 3+ hours per day of interdisciplinary therapy in a comprehensive inpatient rehab setting. Physiatrist is providing close team supervision and 24 hour management of active medical problems listed below. Physiatrist and rehab team continue to assess barriers to discharge/monitor patient progress toward functional and medical goals  Care Tool:  Bathing              Bathing assist       Upper Body Dressing/Undressing Upper body dressing        Upper body assist      Lower Body Dressing/Undressing Lower body dressing            Lower body assist       Toileting Toileting    Toileting assist       Transfers Chair/bed transfer  Transfers assist           Locomotion Ambulation   Ambulation assist              Walk 10 feet activity   Assist           Walk 50 feet activity   Assist           Walk 150 feet activity   Assist           Walk 10 feet on uneven surface  activity   Assist           Wheelchair     Assist               Wheelchair 50 feet with 2 turns activity    Assist            Wheelchair 150 feet activity     Assist          Blood pressure 116/60, pulse 63,  temperature 97.7 F (36.5 C), temperature source Oral, resp. rate 18, weight 57.7 kg, SpO2 99%.  Medical Problem List and Plan: 1. 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  Sternal precautions             -patient may  shower if cover PICC< etc             -ELOS/Goals: 14-18 days supervision to mod I  -Stable to continue CIR  2.  Antithrombotics: -DVT/anticoagulation:  Pharmaceutical: Lovenox .  Venous Doppler studies negative             -antiplatelet therapy: Aspirin  81 mg daily.  Heart failure team considering adding Plavix --will need to check back on this prior to discharge  3. Pain Management: Oxycodone  as needed--no complaints 4. Mood/Behavior/Sleep: Provide emotional support             -antipsychotic agents: Seroquel  25 mg daily at 1000 and 50 mg nightly   - Continue current regimen, can add melatonin 5 mg as needed  5. Neuropsych/cognition: This patient is capable of making decisions on his own behalf. 6. Skin/Wound Care: Routine skin checks. His right lower extremity harvest site completing course of Ancef  for cellulitis--transitioned to Augmentin  1-8 to complete 10-day course  7. Fluids/Electrolytes/Nutrition: Routine in and outs with follow-up chemistries  -1-8: Low albumin , protein; continue current supplemental tube feeds as below.  Encourage protein and p.o. intakes.  Very mild hyponatremia, will recheck on Friday.  8.  Acute blood loss anemia.  Follow-up CBC -stable 9.  COVID-positive.  Completed Paxlovid  10.  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  Seen by vascular surgery Dr Norman Serve and plan outpatient follow-up 1 month 11.  History of VT peri-CABG.  Amiodarone  200 mg daily, Lanoxin  0.125 mg daily 12.  Hyperlipidemia.  Lipitor  13.  Diastolic congestive heart failure.  Lasix  20 mg daily, Cozaar  25 mg daily, Aldactone  25 mg  daily.  Monitor for any signs of fluid overload.  Daily weights.  -1-8: No external signs of volume overload.  Heart failure team evaluated, appreciate recommendations and management.  Close monitoring while switching over to p.o. diuretics. Filed Weights   03/21/23 0500  Weight: 57.7 kg    14.  Sacral wound.  Wound care nurse follow-up.  Covered with foam dressing  15.  Dysphagia.  Dysphagia #2 thin liquids.  Nasogastric tube for nutritional support. Just upgraded diet- might not need Cortrak long -Continue supplemental tube feeds as above, encourage p.o. intakes, protein.      LOS: 1 days A FACE TO FACE EVALUATION WAS PERFORMED  Joesph Walter Vargas 03/21/2023, 8:14 AM

## 2023-03-21 NOTE — Progress Notes (Signed)
 Inpatient Rehabilitation  Patient information reviewed and entered into eRehab system by [facility]. Karen Kays., CCC/SLP, PPS Coordinator.  Information including medical coding, functional ability and quality indicators will be reviewed and updated through discharge.

## 2023-03-21 NOTE — Progress Notes (Addendum)
 Advanced Heart Failure Rounding Note  Cardiologist: None  Chief Complaint: Debility/Heart Failure  Subjective:    Denies SOB. Denis pain.    Objective:   Weight Range: 57.7 kg Body mass index is 19.34 kg/m.   Vital Signs:   Temp:  [97.7 F (36.5 C)-98.1 F (36.7 C)] 97.7 F (36.5 C) (01/08 0450) Pulse Rate:  [62-66] 63 (01/08 0450) Resp:  [17-18] 18 (01/08 0450) BP: (110-125)/(50-62) 116/60 (01/08 0450) SpO2:  [97 %-99 %] 99 % (01/08 0450) Weight:  [57.7 kg] 57.7 kg (01/08 0500) Last BM Date : 03/20/23  Weight change: Filed Weights   03/21/23 0500  Weight: 57.7 kg    Intake/Output:   Intake/Output Summary (Last 24 hours) at 03/21/2023 1010 Last data filed at 03/21/2023 0800 Gross per 24 hour  Intake 220 ml  Output 525 ml  Net -305 ml      Physical Exam    General:  Sitting in the chair. Thin, No resp difficulty HEENT: + Cortrak  Neck: Supple. JVP 5-6  Carotids 2+ bilat; no bruits. No lymphadenopathy or thyromegaly appreciated. Cor: PMI nondisplaced. Regular rate & rhythm. No rubs, gallops or murmurs. Lungs: Clear Abdomen: Soft, nontender, nondistended. No hepatosplenomegaly. No bruits or masses. Good bowel sounds. Extremities: No cyanosis, clubbing, rash, edema. RLE with eschar (harvest site)  Neuro: Alert & orientedx3, cranial nerves grossly intact. moves all 4 extremities w/o difficulty. Affect pleasant   Labs    CBC Recent Labs    03/19/23 0502 03/21/23 0511  WBC 7.9 8.0  NEUTROABS  --  5.3  HGB 9.5* 10.5*  HCT 30.4* 33.5*  MCV 92.7 90.5  PLT 666* 698*   Basic Metabolic Panel Recent Labs    98/93/74 0502 03/20/23 0330 03/21/23 0511  NA 129* 131* 130*  K 4.6 4.6 4.6  CL 99 99 99  CO2 22 23 19*  GLUCOSE 135* 128* 115*  BUN 38* 37* 37*  CREATININE 0.70 0.79 0.88  CALCIUM  8.1* 8.4* 8.4*  PHOS 3.4 3.8  --    Liver Function Tests Recent Labs    03/20/23 0330 03/21/23 0511  AST  --  33  ALT  --  18  ALKPHOS  --  112  BILITOT   --  1.1  PROT  --  6.4*  ALBUMIN  2.4* 2.5*   No results for input(s): LIPASE, AMYLASE in the last 72 hours. Cardiac Enzymes No results for input(s): CKTOTAL, CKMB, CKMBINDEX, TROPONINI in the last 72 hours.  BNP: BNP (last 3 results) Recent Labs    02/10/23 1120  BNP >4,500.0*    ProBNP (last 3 results) Recent Labs    10/12/22 1030 10/19/22 0852 01/08/23 0957  PROBNP 2,878.0* 2,297.0* >4978.0*     D-Dimer No results for input(s): DDIMER in the last 72 hours. Hemoglobin A1C No results for input(s): HGBA1C in the last 72 hours. Fasting Lipid Panel No results for input(s): CHOL, HDL, LDLCALC, TRIG, CHOLHDL, LDLDIRECT in the last 72 hours. Thyroid Function Tests No results for input(s): TSH, T4TOTAL, T3FREE, THYROIDAB in the last 72 hours.  Invalid input(s): FREET3  Other results:   Imaging    No results found.   Medications:     Scheduled Medications:  amiodarone   200 mg Oral BID   ascorbic acid   500 mg Oral Daily   aspirin   81 mg Oral Daily   atorvastatin   80 mg Oral Daily   Chlorhexidine  Gluconate Cloth  6 each Topical Daily   dapagliflozin  propanediol  10 mg  Oral Daily   digoxin   0.125 mg Oral Daily   docusate  200 mg Oral Daily   enoxaparin  (LOVENOX ) injection  40 mg Subcutaneous Q24H   feeding supplement  237 mL Oral TID BM   feeding supplement (PROSource TF20)  60 mL Per Tube Daily   feeding supplement (VITAL 1.5 CAL)  660 mL Per Tube Q24H   Gerhardt's butt cream   Topical Daily   guaiFENesin   15 mL Oral Q6H   losartan   25 mg Oral Daily   mouth rinse  15 mL Mouth Rinse 4 times per day   pantoprazole   40 mg Oral Daily   QUEtiapine   25 mg Oral Daily   QUEtiapine   50 mg Oral QHS   sodium chloride  flush  10-40 mL Intracatheter Q12H   spironolactone   25 mg Oral Daily   thiamine   100 mg Oral Daily   zinc  sulfate (50mg  elemental zinc )  220 mg Oral Daily    Infusions:   ceFAZolin  (ANCEF ) IV 2 g (03/21/23  0604)    PRN Medications: acetaminophen  (TYLENOL ) oral liquid 160 mg/5 mL, mouth rinse, oxyCODONE , sodium chloride , sodium chloride  flush    Patient Profile  atient is well known to the HF service, presented in low output heart failure with multivessel CAD. CABG delayed given COVID positive, underwent on 12/19. Needed Impella 5.5 placement during procedure, slow wean and removed on 12/30. ICU stay complicated by delirium   Assessment/Plan   1. CAD, S/P CABG x5  CT surgery following.  Continue aspirin  + hi intensity statin.  Completing antibiotics for possible vein site infection. Will add stop date.    2. 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  Continue farxiga  10 mg dialy  Continue digoxin  0.125 mg dialy  Contiue losartan  25 mg daily  Continue spiro 25 mg daily   3. H/O VT Continue amio 200 mg twice a day. Cut back to 200 mg 03/25/22  4. Carotid Disease  Pre-CABG dopplers with 80-99% left ICA stenosis. Seen by [facility]  Follow up outpatient.   Appreciate Rehab staff.   Length of Stay: 1  Karlye Ihrig, NP  03/21/2023, 10:10 AM  Advanced Heart Failure Team Pager [phone] (M-F; 7a - 5p)  Please contact [facility] for night-coverage after hours (5p -7a ) and weekends on [email or web address]

## 2023-03-21 NOTE — Consult Note (Signed)
 WOC Nurse Consult Note: Reason for Consult:patient has been evaluated by wound care team 12/26, 03/15/23 and 03/19/23.  Deep tissue injury to sacrum has fully epithelialized with scarring in place.  Patient is on mattress with low air loss feature. Foam dressing to pad and protect, turning and repositioning as ordered.  Patient with severe malnutrition in the setting of chronic illness. He has been receiving enteral feeds.  Swallow study performed and PO foods are being introduced. Dysphagia diet.   Wound type: Full thickness deep tissue injury to sacrum.   Skin tear right arm Pressure Injury POA: Yes Measurement:healed Wound azi:qloob epithelialized.  At risk for further breakdown due to risk factors named above Drainage (amount, consistency, odor) none Periwound:intact  Dressing procedure/placement/frequency: Continue sacral foam, turning every two hours.  COntinue to monitor nutritional status.  Continue Xeroform and foam to arm skin tear as ordered.  No changes at this time.  Will not follow at this time.  Please re-consult if needed.  Darice Cooley MSN, RN, FNP-BC CWON Wound, Ostomy, Continence Nurse Outpatient [facility] [phone] Pager [phone]

## 2023-03-21 NOTE — Evaluation (Signed)
 Occupational Therapy Assessment and Plan  Patient Details  Name: Walter Vargas MRN: [id number] Date of Birth: September 16, 1945  OT Diagnosis: acute pain and muscle weakness (generalized) Rehab Potential: Rehab Potential (ACUTE ONLY): Good ELOS: 10-14 days   Today's Date: 03/21/2023 OT Individual Time: 9179-9062 OT Individual Time Calculation (min): 77 min     Hospital Problem: Principal Problem:   Debility Active Problems:   S/P CABG x 5   Past Medical History:  Past Medical History:  Diagnosis Date   Essential hypertension    HFrEF (heart failure with reduced ejection fraction) (HCC)    Mitral regurgitation    Past Surgical History:  Past Surgical History:  Procedure Laterality Date   CORONARY ARTERY BYPASS GRAFT N/A 03/01/2023   Procedure: CORONARY ARTERY BYPASS GRAFTING X 5, USING LEFT INTERNAL MAMMARY ARTERY AND ENDOSCOPICALLY HARVESTED RIGHT SAPHENOUS VEIN GRAFT;  Surgeon: Kerrin Elspeth JAYSON, MD;  Location: [facility] OR;  Service: Open Heart Surgery;  Laterality: N/A;   EXPLORATION POST OPERATIVE OPEN HEART N/A 03/01/2023   Procedure: EXPLORATION POST OPERATIVE OPEN HEART;  Surgeon: Kerrin Elspeth JAYSON, MD;  Location: [facility] OR;  Service: Open Heart Surgery;  Laterality: N/A;   NO PAST SURGERIES     PLACEMENT OF IMPELLA LEFT VENTRICULAR ASSIST DEVICE  03/01/2023   Procedure: PLACEMENT OF IMPELLA LEFT VENTRICULAR ASSIST DEVICE;  Surgeon: Kerrin Elspeth JAYSON, MD;  Location: [facility] OR;  Service: Open Heart Surgery;;   REMOVAL OF IMPELLA LEFT VENTRICULAR ASSIST DEVICE N/A 03/12/2023   Procedure: REMOVAL OF IMPELLA LEFT VENTRICULAR ASSIST DEVICE;  Surgeon: Kerrin Elspeth JAYSON, MD;  Location: [facility] OR;  Service: Open Heart Surgery;  Laterality: N/A;   RIGHT/LEFT HEART CATH AND CORONARY ANGIOGRAPHY N/A 02/13/2023   Procedure: RIGHT/LEFT HEART CATH AND CORONARY ANGIOGRAPHY;  Surgeon: Mady Bruckner, MD;  Location: [facility] INVASIVE CV LAB;  Service: Cardiovascular;  Laterality: N/A;   TEE WITHOUT  CARDIOVERSION N/A 03/01/2023   Procedure: TRANSESOPHAGEAL ECHOCARDIOGRAM (TEE);  Surgeon: Kerrin Elspeth JAYSON, MD;  Location: [facility] OR;  Service: Open Heart Surgery;  Laterality: N/A;   TEE WITHOUT CARDIOVERSION N/A 03/12/2023   Procedure: TRANSESOPHAGEAL ECHOCARDIOGRAM (TEE);  Surgeon: Kerrin Elspeth JAYSON, MD;  Location: [facility] OR;  Service: Open Heart Surgery;  Laterality: N/A;    Assessment & Plan Clinical Impression: Walter Vargas. 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 Per chart review patient lives alone. 1 level home 3 steps to entry. He has a sister and brother-in-law who plans to provide assistance on discharge. Presented 02/10/2023 with increasing shortness of breath over the last few months as well as chest tightness as well as abdominal discomfort. Family had noted some recent altered mental status. In the ED BNP was 4500 high-sensitivity troponin 13,600-13,871. EKG revealed sinus rhythm at 96 bpm with LVH and secondary repolarization abnormality. Cranial CT scan negative for acute changes. CT of the chest was notable for pulmonary edema and small pleural effusions. He was given IV Lasix  and IV heparin  and cardiology services consulted. CT of the abdomen pelvis showed a distended gallbladder but otherwise unremarkable. A subsequent HIDA scan ruled out cholecystitis. He had catheterization completed showing left ventricular ejection fraction of less than 20% with global hypokinesis and mild concentric LV hypertrophy. RV function was moderately reduced. There was moderate mitral insufficiency and moderate tricuspid insufficiency. No significant AI or AS. Findings of carotid artery stenosis pre-CABG Dopplers with 80 to 99% left  ICA stenosis vascular surgery follow-up plan outpatient..Initial plan for surgical intervention delayed due to  hospital course COVID-19 positive undergoing treatment which has been completed. Patient was cleared for surgery and underwent insertion of Impells ventricular assistive device 03/01/2023 per Dr. Cherrie followed by CABG x 5 03/02/2023 per Dr. Kerrin complicated by postoperative bleeding with reexploration 03/02/2023.SABRA Patient was slowly extubated. Postoperative chest tubes have since been removed. Impella removed 12/30. Hospital course placed on Lovenox  03/07/2023 for DVT prophylaxis venous doppler negative. Acute blood loss anemia 9.5 after transfusion receiving 4 units FFP 2 units cryo and 2 platelets and monitored. Initially placed on Zosyn  12/25 for low-grade fever suspect aspiration pneumonia. Develop some cellulitis from right vein harvest site after CABG completing course of Ancef . Bouts of agitation and restlessness placed on Seroquel . He is currently on dysphagia #2 thin liquid diet as well as nasogastric tube for nutritional support. Wound care nurse follow-up for sacral wound cover with foam dressing. Due to patient decreased functional mobility was admitted for a comprehensive rehab program. Patient transferred to CIR on 03/20/2023 .    Patient currently requires mod with basic self-care skills secondary to muscle weakness, decreased cardiorespiratoy endurance, decreased attention and decreased memory, and decreased standing balance, decreased postural control, and decreased balance strategies.  Prior to hospitalization, patient could complete adlS with independent .  Patient will benefit from skilled intervention to increase independence with basic self-care skills prior to discharge home with care partner.  Anticipate patient will require intermittent supervision and follow up outpatient.  OT - End of Session Activity Tolerance: Tolerates 10 - 20 min activity with multiple rests Endurance Deficit: Yes Endurance Deficit Description: generalized deconditioning OT Assessment Rehab  Potential (ACUTE ONLY): Good OT Barriers to Discharge: Decreased caregiver support OT Barriers to Discharge Comments: He states sister will be in and out OT Patient demonstrates impairments in the following area(s): Balance;Safety;Cognition;Endurance;Motor;Vision;Pain OT Basic ADL's Functional Problem(s): Bathing;Dressing;Toileting OT Transfers Functional Problem(s): Tub/Shower;Toilet OT Additional Impairment(s): None OT Plan OT Intensity: Minimum of 1-2 x/day, 45 to 90 minutes OT Frequency: 5 out of 7 days OT Duration/Estimated Length of Stay: 10-14 days OT Treatment/Interventions: Balance/vestibular training;Discharge planning;Pain management;Self Care/advanced ADL retraining;Therapeutic Activities;UE/LE Coordination activities;Therapeutic Exercise;Skin care/wound managment;Patient/family education;Functional mobility training;Cognitive remediation/compensation;Disease mangement/prevention;Community reintegration;DME/adaptive equipment instruction;Psychosocial support;UE/LE Strength taining/ROM;Wheelchair propulsion/positioning OT Self Feeding Anticipated Outcome(s): no goal OT Basic Self-Care Anticipated Outcome(s): (S) OT Toileting Anticipated Outcome(s): (S) OT Bathroom Transfers Anticipated Outcome(s): (S) OT Recommendation Patient destination: Home Follow Up Recommendations: Home health OT Equipment Recommended: To be determined   OT Evaluation Precautions/Restrictions  Precautions Precautions: Fall;Sternal;Other (comment) Precaution Comments: cortrak, sternal precautions Restrictions Weight Bearing Restrictions Per Provider Order: No General Chart Reviewed: Yes Family/Caregiver Present: No   Home Living/Prior Functioning Home Living Available Help at Discharge: Family, Available 24 hours/day Type of Home: Apartment Home Access: Stairs to enter [employer] of Steps: 3 Entrance Stairs-Rails: Right Home Layout: One level Bathroom Shower/Tub: Teacher, Music: Standard Additional Comments: Per chart review, lives alone; sister and brother-in-law (in good shape) live in [place], state they're retired and plan to stay with pt for initial 24/7 assist at d/c -------- > per chart  Lives With: Alone IADL History Homemaking Responsibilities: Yes Meal Prep Responsibility: Primary Laundry Responsibility: Primary Cleaning Responsibility: Primary Bill Paying/Finance Responsibility: Primary Shopping Responsibility: Primary Current License: Yes Occupation: Retired Prior Function Level of Independence: Independent with basic ADLs, Independent with transfers, Independent with gait Driving: Yes Vocation: Retired Administrator, Sports Baseline Vision/History: 0 No visual deficits Ability to See  in Adequate Light: 0 Adequate Patient Visual Report: No change from baseline Vision Assessment?: Vision impaired- to be further tested in functional context Additional Comments: Occasional over/under shooting, will evaluate further Perception  Perception: Within Functional Limits Praxis Praxis: Impaired Praxis Impairment Details: Motor planning Cognition Cognition Overall Cognitive Status: Impaired/Different from baseline Arousal/Alertness: Awake/alert Orientation Level: Person;Place;Situation Person: Oriented Place: Oriented Situation: Oriented Memory: Impaired Memory Impairment: Decreased recall of new information Awareness: Appears intact Problem Solving: Impaired Problem Solving Impairment: Verbal basic;Functional basic Safety/Judgment: Appears intact Brief Interview for Mental Status (BIMS) Repetition of Three Words (First Attempt): 3 Temporal Orientation: Year: Correct Temporal Orientation: Month: Missed by 6 days to 1 month Temporal Orientation: Day: Incorrect Recall: Sock: No, could not recall Recall: Blue: Yes, no cue required Recall: Bed: No, could not recall BIMS Summary Score: 9 Sensation Sensation Light Touch: Appears  Intact Coordination Gross Motor Movements are Fluid and Coordinated: No Fine Motor Movements are Fluid and Coordinated: No Coordination and Movement Description: Generalized weakness Motor  Motor Motor: Within Functional Limits  Trunk/Postural Assessment  Cervical Assessment Cervical Assessment: Exceptions to [facility] (forward head) Thoracic Assessment Thoracic Assessment: Exceptions to [facility] (rounded shoulders) Lumbar Assessment Lumbar Assessment: Exceptions to [facility] (posterior pelvic tilt) Postural Control Postural Control: Deficits on evaluation Righting Reactions: delayed  Balance Balance Balance Assessed: Yes Static Sitting Balance Static Sitting - Balance Support: Feet supported Static Sitting - Level of Assistance: 5: Stand by assistance Dynamic Sitting Balance Dynamic Sitting - Balance Support: Feet supported Dynamic Sitting - Level of Assistance: 5: Stand by assistance Static Standing Balance Static Standing - Balance Support: During functional activity;Bilateral upper extremity supported Static Standing - Level of Assistance: 4: Min assist Dynamic Standing Balance Dynamic Standing - Balance Support: During functional activity;Bilateral upper extremity supported Dynamic Standing - Level of Assistance: 4: Min assist Extremity/Trunk Assessment RUE Assessment RUE Assessment: Exceptions to [facility] General Strength Comments: Did not MMT 2/2 sternal precautions, grip WFL and reaching during ADLs WFL- generalized weakness LUE Assessment LUE Assessment: Exceptions to [facility] General Strength Comments: Did not MMT 2/2 sternal precautions, grip WFL and reaching during ADLs WFL- generalized weakness  Care Tool Care Tool Self Care Eating   Eating Assist Level: Supervision/Verbal cueing    Oral Care    Oral Care Assist Level: Supervision/Verbal cueing    Bathing   Body parts bathed by patient: Right arm;Left arm;Chest;Abdomen;Front perineal area;Buttocks;Right upper leg;Left upper  leg;Face Body parts bathed by helper: Right lower leg;Left lower leg   Assist Level: Moderate Assistance - Patient 50 - 74%    Upper Body Dressing(including orthotics)   What is the patient wearing?: Pull over shirt   Assist Level: Maximal Assistance - Patient 25 - 49%    Lower Body Dressing (excluding footwear)   What is the patient wearing?: Pants;Underwear/pull up Assist for lower body dressing: Maximal Assistance - Patient 25 - 49%    Putting on/Taking off footwear   What is the patient wearing?: Non-skid slipper socks Assist for footwear: Total Assistance - Patient < 25%       Care Tool Toileting Toileting activity   Assist for toileting: Maximal Assistance - Patient 25 - 49%     Care Tool Bed Mobility Roll left and right activity   Roll left and right assist level: Minimal Assistance - Patient > 75%    Sit to lying activity   Sit to lying assist level: Minimal Assistance - Patient > 75%    Lying to sitting on side of bed activity  Lying to sitting on side of bed assist level: the ability to move from lying on the back to sitting on the side of the bed with no back support.: Minimal Assistance - Patient > 75%     Care Tool Transfers Sit to stand transfer   Sit to stand assist level: Moderate Assistance - Patient 50 - 74%    Chair/bed transfer   Chair/bed transfer assist level: Moderate Assistance - Patient 50 - 74%     Toilet transfer   Assist Level: Moderate Assistance - Patient 50 - 74%     Care Tool Cognition  Expression of Ideas and Wants Expression of Ideas and Wants: 4. Without difficulty (complex and basic) - expresses complex messages without difficulty and with speech that is clear and easy to understand  Understanding Verbal and Non-Verbal Content Understanding Verbal and Non-Verbal Content: 3. Usually understands - understands most conversations, but misses some part/intent of message. Requires cues at times to understand   Memory/Recall Ability  Memory/Recall Ability : Location of own room;That he or she is in a hospital/hospital unit   Refer to Care Plan for Long Term Goals  SHORT TERM GOAL WEEK 1 OT Short Term Goal 1 (Week 1): Pt will don a shirt with min A OT Short Term Goal 2 (Week 1): Pt will complete LB dressing with min A OT Short Term Goal 3 (Week 1): Pt will complete toilet transfer with min A OT Short Term Goal 4 (Week 1): Pt will complete ADL routine with no more than 1 rest break to demo increased activity tolerance  Recommendations for other services: None    Skilled Therapeutic Intervention ADL ADL Eating: Supervision/safety Where Assessed-Eating: Bed level Grooming: Supervision/safety Where Assessed-Grooming: Sitting at sink Upper Body Bathing: Supervision/safety Where Assessed-Upper Body Bathing: Sitting at sink Lower Body Bathing: Moderate assistance Where Assessed-Lower Body Bathing: Sitting at sink Upper Body Dressing: Moderate assistance Where Assessed-Upper Body Dressing: Sitting at sink Lower Body Dressing: Maximal assistance Where Assessed-Lower Body Dressing: Sitting at sink Toileting: Maximal assistance Where Assessed-Toileting: Teacher, Adult Education: Moderate assistance Toilet Transfer Method: Ambulating Mobility  Bed Mobility Bed Mobility: Sit to Supine;Supine to Sit Supine to Sit: Minimal Assistance - Patient > 75% Sit to Supine: Minimal Assistance - Patient > 75% Transfers Sit to Stand: Moderate Assistance - Patient 50-74% Stand to Sit: Moderate Assistance - Patient 50-74%  Skilled OT evaluation completed with the creation of pt centered OT POC. Pt educated on condition, ELOS, rehab expectations, and fall risk reduction strategies throughout session. Pt with poor adherence to sternal precautions but also very little pain, educated on stay in the tube precautions. ADLs completed sink side as described above. Pt mod A to stand but then min A once on his feet with the RW. Vitals assessed  throughout session intermittently and all WNL. Some motor planning deficits present and cognitive deficits that will require further testing. Pt was left sitting up in the w/c with all needs met, chair alarm set, and call bell within reach.    Discharge Criteria: Patient will be discharged from OT if patient refuses treatment 3 consecutive times without medical reason, if treatment goals not met, if there is a change in medical status, if patient makes no progress towards goals or if patient is discharged from hospital.  The above assessment, treatment plan, treatment alternatives and goals were discussed and mutually agreed upon: by patient  [name] 03/21/2023, 9:56 AM

## 2023-03-21 NOTE — Progress Notes (Signed)
 Inpatient Rehabilitation Admission Medication Review by a Pharmacist  A complete drug regimen review was completed for this patient to identify any potential clinically significant medication issues.  High Risk Drug Classes Is patient taking? Indication by Medication  Antipsychotic Yes Quetiapine  - mood/behavior  Anticoagulant Yes Enoxparin - VTE prophylaxis  Antibiotic Yes Cefazolin  - cellulitis RLE vein harvest site  Opioid Yes PRN Oxycodone  - severe pain  Antiplatelet Yes Aspirin  81 mg - CAD  Hypoglycemics/insulin  Yes Dapagliflozin  - CHF  Vasoactive Medication Yes Amiodarone  - VT post-CABG Digoxin , Losartan , Spironolactone , Dapagliflozin  - CHF  Chemotherapy No   Other Yes Atorvastatin  - hyperlipidemia Pantoprazole  - GI prophylaxis Guaifenesin  - mucolytic Thiamine , Vitamin C , Zinc  - supplements Docusate - laxative  PRNs: Acetaminophen  - mild pain, headache or fever Saline Nasal Spray - congestion     Type of Medication Issue Identified Description of Issue Recommendation(s)  Drug Interaction(s) (clinically significant)     Duplicate Therapy     Allergy     No Medication Administration End Date  Cefazolin  to Augmentin  x 7 days versus complete 10-day course. Begun 1/1 HF team has addressed. Cefazolin  thru tonight, then Augmentin  x 3 days.  Incorrect Dose     Additional Drug Therapy Needed     Significant med changes from prior encounter (inform family/care partners about these prior to discharge). Metoprolol  XL discontinued. Off furosemide , MVI. Vitamin C  dose decreased. All other medications are new since admitted 02/10/23, except ASA 81 mg. Communicate changes with patient/family prior to discharge.  Other  DC summary indicates plans to:   1.Decrease Amiodarone  from BID to daily after 7 days; no decr in place.  2. Change Cefazolin  to Augmentin  x 7 days; cefazolin  continues.  3. Continue Furosemide  20 mg daily (but dc'd per HF team on 1/7)  4. Begin Jaridance 10  mg daily, but Farxiga  10 mg daily begun 1/4. Clarified with Heart Failure team and orders have been updated.  Amiodarone  to daily on 1/14   Cefazolin  thru tonight, then Augmentin  for 3 days.   No furosemide  today per HF team. To continue to assess daily.  Continue Farxiga . Copay is $0 for both meds.    Clinically significant medication issues were identified that warrant physician communication and completion of prescribed/recommended actions by midnight of the next day:  Yes  Name of provider notified for urgent issues identified:    - Olam Chalk, RPh, rounding with Heart Failure team.  Provider Method of Notification: secure chat  Pharmacist comments:  - discrepancies between discharge summary and CIR orders resolved.  Time spent performing this drug regimen review (minutes):  [address]   Genaro Niemann [place], [place] 03/21/2023 10:57 AM

## 2023-03-21 NOTE — Plan of Care (Signed)
  Problem: RH Swallowing Goal: LTG Patient will consume least restrictive diet using compensatory strategies with assistance (SLP) Description: LTG:  Patient will consume least restrictive diet using compensatory strategies with assistance (SLP) Flowsheets (Taken 03/21/2023 1224) LTG: Pt Patient will consume least restrictive diet using compensatory strategies with assistance of (SLP): Minimal Assistance - Patient > 75%   Problem: RH Cognition - SLP Goal: RH LTG Patient will demonstrate orientation with cues Description:  LTG:  Patient will demonstrate orientation to person/place/time/situation with cues (SLP)   Flowsheets (Taken 03/21/2023 1224) LTG Patient will demonstrate orientation to:  Person  Place  Time  Situation LTG: Patient will demonstrate orientation using cueing (SLP): Moderate Assistance - Patient 50 - 74%   Problem: RH Problem Solving Goal: LTG Patient will demonstrate problem solving for (SLP) Description: LTG:  Patient will demonstrate problem solving for basic/complex daily situations with cues  (SLP) Flowsheets (Taken 03/21/2023 1224) LTG: Patient will demonstrate problem solving for (SLP): Basic daily situations LTG Patient will demonstrate problem solving for: Moderate Assistance - Patient 50 - 74%   Problem: RH Memory Goal: LTG Patient will use memory compensatory aids to (SLP) Description: LTG:  Patient will use memory compensatory aids to recall biographical/new, daily complex information with cues (SLP) Flowsheets (Taken 03/21/2023 1224) LTG: Patient will use memory compensatory aids to (SLP): Moderate Assistance - Patient 50 - 74%   Problem: RH Attention Goal: LTG Patient will demonstrate this level of attention during functional activites (SLP) Description: LTG:  Patient will will demonstrate this level of attention during functional activites (SLP) Flowsheets (Taken 03/21/2023 1224) Patient will demonstrate during cognitive/linguistic activities the attention type  of: Sustained Patient will demonstrate this level of attention during cognitive/linguistic activities in: Controlled LTG: Patient will demonstrate this level of attention during cognitive/linguistic activities with assistance of (SLP): Moderate Assistance - Patient 50 - 74% Number of minutes patient will demonstrate attention during cognitive/linguistic activities: 20   Problem: RH Awareness Goal: LTG: Patient will demonstrate awareness during functional activites type of (SLP) Description: LTG: Patient will demonstrate awareness during functional activites type of (SLP) Flowsheets (Taken 03/21/2023 1224) Patient will demonstrate during cognitive/linguistic activities awareness type of: Intellectual LTG: Patient will demonstrate awareness during cognitive/linguistic activities with assistance of (SLP): Moderate Assistance - Patient 50 - 74%

## 2023-03-21 NOTE — Progress Notes (Signed)
 Received a call from Carney Bern, asking for Alteplase order for Mr [facility], per IV team. His note from yesterday was reviewed, spoke with Chipper Oman regarding the IV Team protocol. Alteplase ordered given to Carney Bern, she verbalizes understanding.

## 2023-03-21 NOTE — Evaluation (Signed)
 Physical Therapy Assessment and Plan  Patient Details  Name: Walter Vargas MRN: [id number] Date of Birth: [birth year]  PT Diagnosis: Abnormality of gait, Cognitive deficits, Difficulty walking, and Muscle weakness Rehab Potential: Good ELOS: 14-17 Days   Today's Date: 03/21/2023 PT Individual Time: 1300-1400 PT Individual Time Calculation (min): 60 min    Hospital Problem: Principal Problem:   Debility Active Problems:   S/P CABG x 5   Past Medical History:  Past Medical History:  Diagnosis Date   Essential hypertension    HFrEF (heart failure with reduced ejection fraction) (HCC)    Mitral regurgitation    Past Surgical History:  Past Surgical History:  Procedure Laterality Date   CORONARY ARTERY BYPASS GRAFT N/A 03/01/2023   Procedure: CORONARY ARTERY BYPASS GRAFTING X 5, USING LEFT INTERNAL MAMMARY ARTERY AND ENDOSCOPICALLY HARVESTED RIGHT SAPHENOUS VEIN GRAFT;  Surgeon: Kerrin Elspeth JAYSON, MD;  Location: [facility] OR;  Service: Open Heart Surgery;  Laterality: N/A;   EXPLORATION POST OPERATIVE OPEN HEART N/A 03/01/2023   Procedure: EXPLORATION POST OPERATIVE OPEN HEART;  Surgeon: Kerrin Elspeth JAYSON, MD;  Location: [facility] OR;  Service: Open Heart Surgery;  Laterality: N/A;   NO PAST SURGERIES     PLACEMENT OF IMPELLA LEFT VENTRICULAR ASSIST DEVICE  03/01/2023   Procedure: PLACEMENT OF IMPELLA LEFT VENTRICULAR ASSIST DEVICE;  Surgeon: Kerrin Elspeth JAYSON, MD;  Location: [facility] OR;  Service: Open Heart Surgery;;   REMOVAL OF IMPELLA LEFT VENTRICULAR ASSIST DEVICE N/A 03/12/2023   Procedure: REMOVAL OF IMPELLA LEFT VENTRICULAR ASSIST DEVICE;  Surgeon: Kerrin Elspeth JAYSON, MD;  Location: [facility] OR;  Service: Open Heart Surgery;  Laterality: N/A;   RIGHT/LEFT HEART CATH AND CORONARY ANGIOGRAPHY N/A 02/13/2023   Procedure: RIGHT/LEFT HEART CATH AND CORONARY ANGIOGRAPHY;  Surgeon: Mady Bruckner, MD;  Location: [facility] INVASIVE CV LAB;  Service: Cardiovascular;  Laterality: N/A;   TEE WITHOUT  CARDIOVERSION N/A 03/01/2023   Procedure: TRANSESOPHAGEAL ECHOCARDIOGRAM (TEE);  Surgeon: Kerrin Elspeth JAYSON, MD;  Location: [facility] OR;  Service: Open Heart Surgery;  Laterality: N/A;   TEE WITHOUT CARDIOVERSION N/A 03/12/2023   Procedure: TRANSESOPHAGEAL ECHOCARDIOGRAM (TEE);  Surgeon: Kerrin Elspeth JAYSON, MD;  Location: [facility] OR;  Service: Open Heart Surgery;  Laterality: N/A;    Assessment & Plan Clinical Impression: Patient is a 78 year old right-handed male with history significant for hypertension, heart failure/cardiomyopathy with reduced ejection fraction 20%/mitral regurgitation, history of VT, quit smoking 47 years ago. Per chart review patient lives alone. 1 level home 3 steps to entry. He has a sister and brother-in-law who plans to provide assistance on discharge. Presented 02/10/2023 with increasing shortness of breath over the last few months as well as chest tightness as well as abdominal discomfort. Family had noted some recent altered mental status. In the ED BNP was 4500 high-sensitivity troponin 13,600-13,871. EKG revealed sinus rhythm at 96 bpm with LVH and secondary repolarization abnormality. Cranial CT scan negative for acute changes. CT of the chest was notable for pulmonary edema and small pleural effusions. He was given IV Lasix  and IV heparin  and cardiology services consulted. CT of the abdomen pelvis showed a distended gallbladder but otherwise unremarkable. A subsequent HIDA scan ruled out cholecystitis. He had catheterization completed showing left ventricular ejection fraction of less than 20% with global hypokinesis and mild concentric LV hypertrophy. RV function was moderately reduced. There was moderate mitral insufficiency and moderate tricuspid insufficiency. No significant AI or AS. 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 Patient was cleared for surgery and underwent insertion of Impells ventricular assistive device 03/01/2023 per Dr. Cherrie followed by CABG x 5 03/02/2023 per Dr. Kerrin complicated by postoperative bleeding with reexploration 03/02/2023.SABRA Patient was slowly extubated. Postoperative chest tubes have since been removed. Impella removed 12/30. Hospital course placed on Lovenox  03/07/2023 for DVT prophylaxis venous doppler negative. Acute blood loss anemia 9.5 after transfusion receiving 4 units FFP 2 units cryo and 2 platelets and monitored. Initially placed on Zosyn  12/25 for low-grade fever suspect aspiration pneumonia. Develop some cellulitis from right vein harvest site after CABG completing course of Ancef . Bouts of agitation and restlessness placed on Seroquel . He is currently on dysphagia #2 thin liquid diet as well as nasogastric tube for nutritional support. Wound care nurse follow-up for sacral wound cover with foam dressing.   Patient transferred to CIR on 03/20/2023 .   Patient currently requires mod with mobility secondary to muscle weakness, decreased cardiorespiratoy endurance, decreased awareness, decreased problem solving, and decreased memory, and decreased sitting balance, decreased standing balance, decreased postural control, decreased balance strategies, and difficulty maintaining precautions.  Prior to hospitalization, patient was independent  with mobility and lived with Alone in a Apartment home.  Home access is 3Stairs to enter.  Patient will benefit from skilled PT intervention to maximize safe functional mobility, minimize fall risk, and decrease caregiver burden for planned discharge home with 24 hour supervision.  Anticipate patient will benefit from follow up HH at discharge.  PT - End of Session Activity Tolerance: Tolerates 10 - 20 min activity with multiple rests Endurance Deficit: Yes Endurance  Deficit Description: generalized deconditioning PT Assessment Rehab Potential (ACUTE/IP ONLY): Good PT Barriers to Discharge: Lack of/limited family support PT Patient demonstrates impairments in the following area(s): Balance;Endurance;Motor;Pain;Perception;Safety;Skin Integrity PT Transfers Functional Problem(s): Bed Mobility;Car;Furniture;Bed to Chair PT Locomotion Functional Problem(s): Ambulation;Stairs PT Plan PT Intensity: Minimum of 1-2 x/day ,45 to 90 minutes PT Frequency: 5 out of 7 days PT Duration Estimated Length of Stay: 14-17 Days PT Treatment/Interventions: [employer] reintegration;Ambulation/gait training;DME/adaptive equipment instruction;Neuromuscular re-education;Psychosocial support;Stair training;UE/LE Strength taining/ROM;Balance/vestibular training;Functional electrical stimulation;Therapeutic Activities;UE/LE Coordination activities;Pain management;Skin care/wound management;Discharge planning;Cognitive remediation/compensation;Disease management/prevention;Patient/family education;Functional mobility training;Splinting/orthotics;Therapeutic Exercise;Visual/perceptual remediation/compensation PT Transfers Anticipated Outcome(s): Supervision PT Locomotion Anticipated Outcome(s): Supervision PT Recommendation Follow Up Recommendations: 24 hour supervision/assistance;Home health PT Patient destination: Home Equipment Recommended: To be determined   PT Evaluation Precautions/Restrictions Precautions Precautions: Fall;Sternal;Other (comment) Precaution Booklet Issued: No Precaution Comments: cortrak, sternal precautions Restrictions Weight Bearing Restrictions Per Provider Order: No General Chart Reviewed: Yes  Pain Pain Assessment Pain Scale: 0-10 Pain Score: 0-No pain Pain Interference Pain Interference Pain Effect on Sleep: 1. Rarely or not at all Pain Interference with Therapy Activities: 2. Occasionally Pain Interference with Day-to-Day Activities: 2.  Occasionally Home Living/Prior Functioning Home Living Available Help at Discharge: Family (family does not live nearby, closet family is 3 hours away per patient.) Type of Home: Apartment  Lives With: Alone Prior Function Vocation: Retired Vision/Perception  Vision - History Ability to See in Adequate Light: 0 Adequate Perception Perception: Within Functional Limits Praxis Praxis: Impaired Praxis Impairment Details: Motor planning  Cognition Overall Cognitive Status: Impaired/Different from baseline Arousal/Alertness: Awake/alert Orientation Level: Oriented to person;Oriented to place;Oriented to situation Year:  (2005) Month: January Day of Week: Correct Attention: Sustained Sustained Attention: Impaired Sustained Attention Impairment: Verbal basic;Functional basic Memory: Impaired Memory Impairment: Decreased recall of new  information Awareness: Impaired Awareness Impairment: Intellectual impairment;Emergent impairment;Anticipatory impairment Problem Solving: Impaired Problem Solving Impairment: Verbal basic;Functional basic Executive Function: Self Monitoring;Self Correcting;Organizing Organizing: Impaired Organizing Impairment: Verbal basic;Functional basic Self Monitoring: Impaired Self Monitoring Impairment: Verbal basic;Functional basic Self Correcting: Impaired Self Correcting Impairment: Verbal basic;Functional basic Safety/Judgment: Impaired Comments: suspect baseline deficits, however family not present to corroborate. Sensation Sensation Light Touch: Appears Intact Coordination Gross Motor Movements are Fluid and Coordinated: No Fine Motor Movements are Fluid and Coordinated: No Coordination and Movement Description: Generalized weakness Motor  Motor Motor: Within Functional Limits  Trunk/Postural Assessment  Cervical Assessment Cervical Assessment:  (forward head) Thoracic Assessment Thoracic Assessment:  (rounded shoulders) Lumbar  Assessment Lumbar Assessment:  (posterior pelvic tilt) Postural Control Postural Control: Deficits on evaluation Righting Reactions: delayed  Balance Balance Balance Assessed: Yes Static Sitting Balance Static Sitting - Balance Support: Feet supported Static Sitting - Level of Assistance: 5: Stand by assistance Dynamic Sitting Balance Dynamic Sitting - Balance Support: Feet supported Dynamic Sitting - Level of Assistance: 5: Stand by assistance Static Standing Balance Static Standing - Balance Support: During functional activity;Bilateral upper extremity supported Static Standing - Level of Assistance: 4: Min assist Dynamic Standing Balance Dynamic Standing - Balance Support: During functional activity;Bilateral upper extremity supported Dynamic Standing - Level of Assistance: 4: Min assist Extremity Assessment  RLE Assessment RLE Assessment: Exceptions to [facility] General Strength Comments: Grossly 4/5 LLE Assessment LLE Assessment: Exceptions to [facility] General Strength Comments: Grossly 4/5  Care Tool Care Tool Bed Mobility Roll left and right activity   Roll left and right assist level: Minimal Assistance - Patient > 75%    Sit to lying activity   Sit to lying assist level: Minimal Assistance - Patient > 75%    Lying to sitting on side of bed activity   Lying to sitting on side of bed assist level: the ability to move from lying on the back to sitting on the side of the bed with no back support.: Minimal Assistance - Patient > 75%     Care Tool Transfers Sit to stand transfer   Sit to stand assist level: Moderate Assistance - Patient 50 - 74%    Chair/bed transfer   Chair/bed transfer assist level: Moderate Assistance - Patient 50 - 74%    Car transfer          Care Tool Locomotion Ambulation   Assist level: 2 helpers Assistive device: No Device Max distance: 5'  Walk 10 feet activity Walk 10 feet activity did not occur: Safety/medical concerns Assist level:  (able  to ambulate with RW, but at basline did not use an AD)     Walk 50 feet with 2 turns activity Walk 50 feet with 2 turns activity did not occur: Safety/medical concerns      Walk 150 feet activity Walk 150 feet activity did not occur: Safety/medical concerns      Walk 10 feet on uneven surfaces activity Walk 10 feet on uneven surfaces activity did not occur: Safety/medical concerns      Stairs   Assist level: Moderate Assistance - Patient - 50 - 74% Stairs assistive device: 2 hand rails Max number of stairs: 8  Walk up/down 1 step activity   Walk up/down 1 step (curb) assist level: Moderate Assistance - Patient - 50 - 74% Walk up/down 1 step or curb assistive device: 2 hand rails  Walk up/down 4 steps activity   Walk up/down 4 steps assist level: Moderate Assistance - Patient - 61 -  74% Walk up/down 4 steps assistive device: 2 hand rails  Walk up/down 12 steps activity Walk up/down 12 steps activity did not occur: Safety/medical concerns (fatigue)      Pick up small objects from floor   Pick up small object from the floor assist level: Moderate Assistance - Patient 50 - 74%;Total Assistance - Patient < 25%    Wheelchair Is the patient using a wheelchair?: Yes Type of Wheelchair: Manual Wheelchair activity did not occur:  (Sternal precautions) Wheelchair assist level: Dependent - Patient 0% Max wheelchair distance: 150'  Wheel 50 feet with 2 turns activity Wheelchair 50 feet with 2 turns activity did not occur: Safety/medical concerns    Wheel 150 feet activity Wheelchair 150 feet activity did not occur: Safety/medical concerns      Refer to Care Plan for Long Term Goals  SHORT TERM GOAL WEEK 1 PT Short Term Goal 1 (Week 1): Pt will complete bed mobility with CGA. PT Short Term Goal 2 (Week 1): Pt will completes sit to stand with minA and LRAD. PT Short Term Goal 3 (Week 1): Pt will complete bed to chair with minA and LRAD. PT Short Term Goal 4 (Week 1): Pt will ambulate  x50' with minA and LRAD.  Recommendations for other services: None   Skilled Therapeutic Intervention  Evaluation completed (see details above and below) with education on PT POC and goals and individual treatment initiated with focus on balance, transfers, ambulation, car transfer, and stair training. Pt received seated in WC and agrees to therapy. During session reports pain in Rt knee. PT provides rest breaks and mobility to manage pain. WC transport to gym. Pt performs sit to stand with modA and cues for hand placement, body mechanics, and sequencing. Pt ambulates x5' with modA and no AD, increasing to maxA as pt fatigues and begins to demonstrate posterior lean, and requires +2 to bring [facility] for safety. PT provides education on safe use of RW while pt takes seated rest break. Pt then stands and ambulates x15' with RW and modA, with cues for posture and reciprocal gait pattern. Pt performs car transfer with modA and cues for sequencing. Following rest break, pt completes x8 3 steps with bilateral hand rails and modA, with cues for safe step placement and sequencing. WC transport back to room. Left seated with alarm intact and all needs within reach.   Mobility Bed Mobility Bed Mobility: Sit to Supine;Supine to Sit Supine to Sit: Minimal Assistance - Patient > 75% Sit to Supine: Minimal Assistance - Patient > 75% Transfers Transfers: Sit to Stand;Stand to Sit;Stand Pivot Transfers Sit to Stand: Moderate Assistance - Patient 50-74% Stand to Sit: Moderate Assistance - Patient 50-74% Stand Pivot Transfers: Moderate Assistance - Patient 50 - 74% Stand Pivot Transfer Details: Verbal cues for safe use of DME/AE;Verbal cues for gait pattern;Verbal cues for technique Transfer (Assistive device): Rolling walker Locomotion  Gait Ambulation: Yes Gait Assistance: Moderate Assistance - Patient 50-74% Gait Distance (Feet): 15 Feet Assistive device: Rolling walker Gait Assistance Details: Tactile cues  for posture;Verbal cues for sequencing;Verbal cues for gait pattern;Verbal cues for safe use of DME/AE Gait Gait: Yes Gait Pattern: Impaired Gait Pattern: Shuffle Gait velocity: Decreased Stairs / Additional Locomotion Stairs: Yes Stairs Assistance: Moderate Assistance - Patient 50 - 74% Stair Management Technique: Two rails Number of Stairs: 8 Height of Stairs: 3 Wheelchair Mobility Wheelchair Mobility: No   Discharge Criteria: Patient will be discharged from PT if patient refuses treatment 3 consecutive times without  medical reason, if treatment goals not met, if there is a change in medical status, if patient makes no progress towards goals or if patient is discharged from hospital.  The above assessment, treatment plan, treatment alternatives and goals were discussed and mutually agreed upon: by patient  Elsie JAYSON Dawn, PT, DPT 03/21/2023, 5:01 PM

## 2023-03-21 NOTE — Progress Notes (Signed)
 Inpatient Rehabilitation Care Coordinator Assessment and Plan Patient Details  Name: Walter Vargas MRN: [id number] Date of Birth: 07-01-1945  Today's Date: 03/21/2023  Hospital Problems: Principal Problem:   Debility Active Problems:   S/P CABG x 5  Past Medical History:  Past Medical History:  Diagnosis Date   Essential hypertension    HFrEF (heart failure with reduced ejection fraction) (HCC)    Mitral regurgitation    Past Surgical History:  Past Surgical History:  Procedure Laterality Date   CORONARY ARTERY BYPASS GRAFT N/A 03/01/2023   Procedure: CORONARY ARTERY BYPASS GRAFTING X 5, USING LEFT INTERNAL MAMMARY ARTERY AND ENDOSCOPICALLY HARVESTED RIGHT SAPHENOUS VEIN GRAFT;  Surgeon: Kerrin Elspeth JAYSON, MD;  Location: [facility] OR;  Service: Open Heart Surgery;  Laterality: N/A;   EXPLORATION POST OPERATIVE OPEN HEART N/A 03/01/2023   Procedure: EXPLORATION POST OPERATIVE OPEN HEART;  Surgeon: Kerrin Elspeth JAYSON, MD;  Location: [facility] OR;  Service: Open Heart Surgery;  Laterality: N/A;   NO PAST SURGERIES     PLACEMENT OF IMPELLA LEFT VENTRICULAR ASSIST DEVICE  03/01/2023   Procedure: PLACEMENT OF IMPELLA LEFT VENTRICULAR ASSIST DEVICE;  Surgeon: Kerrin Elspeth JAYSON, MD;  Location: [facility] OR;  Service: Open Heart Surgery;;   REMOVAL OF IMPELLA LEFT VENTRICULAR ASSIST DEVICE N/A 03/12/2023   Procedure: REMOVAL OF IMPELLA LEFT VENTRICULAR ASSIST DEVICE;  Surgeon: Kerrin Elspeth JAYSON, MD;  Location: [facility] OR;  Service: Open Heart Surgery;  Laterality: N/A;   RIGHT/LEFT HEART CATH AND CORONARY ANGIOGRAPHY N/A 02/13/2023   Procedure: RIGHT/LEFT HEART CATH AND CORONARY ANGIOGRAPHY;  Surgeon: Mady Bruckner, MD;  Location: [facility] INVASIVE CV LAB;  Service: Cardiovascular;  Laterality: N/A;   TEE WITHOUT CARDIOVERSION N/A 03/01/2023   Procedure: TRANSESOPHAGEAL ECHOCARDIOGRAM (TEE);  Surgeon: Kerrin Elspeth JAYSON, MD;  Location: [facility] OR;  Service: Open Heart Surgery;  Laterality: N/A;   TEE  WITHOUT CARDIOVERSION N/A 03/12/2023   Procedure: TRANSESOPHAGEAL ECHOCARDIOGRAM (TEE);  Surgeon: Kerrin Elspeth JAYSON, MD;  Location: [facility] OR;  Service: Open Heart Surgery;  Laterality: N/A;   Social History:  reports that he quit smoking about 47 years ago. His smoking use included cigarettes. He started smoking about 57 years ago. He has a 5 pack-year smoking history. He has never used smokeless tobacco. He reports that he does not currently use alcohol. He reports that he does not use drugs.  Family / Support Systems Marital Status: Divorced How Long?: 15 years Spouse/Significant Other: Divorced Children: Son [name]- only child Other Supports: Sister Engineer, Mining and her husband Anticipated Caregiver: Sister Steffi and BIL Ability/Limitations of Caregiver: Plans for pt sister and her husband to come and stay with him. SW will confirm. Caregiver Availability: 24/7 Family Dynamics: Pt lives alone  Social History Preferred language: English Religion: None Cultural Background: Pt built houses until retirement at age 76 Education: college Health Literacy - How often do you need to have someone help you when you read instructions, pamphlets, or other written material from your doctor or pharmacy?: Never Writes: Yes Employment Status: Retired Age Retired: 56 Marine Scientist Issues: Denies Guardian/Conservator: No HCPOA   Abuse/Neglect Abuse/Neglect Assessment Can Be Completed: Yes Physical Abuse: Denies Verbal Abuse: Denies Sexual Abuse: Denies Exploitation of patient/patient's resources: Denies Self-Neglect: Denies  Patient response to: Social Isolation - How often do you feel lonely or isolated from those around you?: Never  Emotional Status Pt's affect, behavior and adjustment status: Pt really tired from therapy at time of visit, and dozed off/on to sleep. Otherwise, he was in  good spirits. Recent Psychosocial Issues: Denies Psychiatric History: Denies Substance  Abuse History: Denies  Patient / Family Perceptions, Expectations & Goals Pt/Family understanding of illness & functional limitations: Pt has general understanding of care need Premorbid pt/family roles/activities: Independent Anticipated changes in roles/activities/participation: Assistance with ADLs/IADLs Pt/family expectations/goals: Pt goal is to work on getting back to auto-owners insurance straight.  Community Resources [employer]: None Premorbid Home Care/DME Agencies: None Transportation available at discharge: TBD Is the patient able to respond to transportation needs?: Yes In the past 12 months, has lack of transportation kept you from medical appointments or from getting medications?: No In the past 12 months, has lack of transportation kept you from meetings, work, or from getting things needed for daily living?: No Resource referrals recommended: Neuropsychology  Discharge Planning Living Arrangements: Alone, Other relatives Support Systems: Other relatives Type of Residence: Private residence Insurance Resources: Media Planner (specify) ([employer]HC Dual Complete) Financial Resources: Restaurant Manager, Fast Food Screen Referred: No Living Expenses: Rent Money Management: Patient Does the patient have any problems obtaining your medications?: No Home Management: Pt manages all homecare needs Patient/Family Preliminary Plans: TBD Care Coordinator Barriers to Discharge: Decreased caregiver support, Lack of/limited family support, Community Education Officer for SNF coverage, Other (comments) Care Coordinator Barriers to Discharge Comments: current cortrak Care Coordinator Anticipated Follow Up Needs: HH/OP  Clinical Impression SW met with pt in room at bedside to introduce self, explain role, and discuss discharge process. Pt is not a cytogeneticist. No HCOA. DME- rollator (in room). Pt aware this SW will follow-up with his son [name].   Orchid Glassberg A Khyleigh Furney 03/21/2023, 3:57 PM

## 2023-03-21 NOTE — Evaluation (Signed)
 Speech Language Pathology Assessment and Plan  Patient Details  Name: Walter Vargas MRN: [id number] Date of Birth: 21-Jun-1945  SLP Diagnosis: Cognitive Impairments;Dysphagia  Rehab Potential: Good ELOS: 10-14 days   Today's Date: 03/21/2023 SLP Individual Time: 8995-8940 SLP Individual Time Calculation (min): 55 min  Hospital Problem: Principal Problem:   Debility Active Problems:   S/P CABG x 5  Past Medical History:  Past Medical History:  Diagnosis Date   Essential hypertension    HFrEF (heart failure with reduced ejection fraction) (HCC)    Mitral regurgitation    Past Surgical History:  Past Surgical History:  Procedure Laterality Date   CORONARY ARTERY BYPASS GRAFT N/A 03/01/2023   Procedure: CORONARY ARTERY BYPASS GRAFTING X 5, USING LEFT INTERNAL MAMMARY ARTERY AND ENDOSCOPICALLY HARVESTED RIGHT SAPHENOUS VEIN GRAFT;  Surgeon: Kerrin Elspeth JAYSON, MD;  Location: [facility] OR;  Service: Open Heart Surgery;  Laterality: N/A;   EXPLORATION POST OPERATIVE OPEN HEART N/A 03/01/2023   Procedure: EXPLORATION POST OPERATIVE OPEN HEART;  Surgeon: Kerrin Elspeth JAYSON, MD;  Location: [facility] OR;  Service: Open Heart Surgery;  Laterality: N/A;   NO PAST SURGERIES     PLACEMENT OF IMPELLA LEFT VENTRICULAR ASSIST DEVICE  03/01/2023   Procedure: PLACEMENT OF IMPELLA LEFT VENTRICULAR ASSIST DEVICE;  Surgeon: Kerrin Elspeth JAYSON, MD;  Location: [facility] OR;  Service: Open Heart Surgery;;   REMOVAL OF IMPELLA LEFT VENTRICULAR ASSIST DEVICE N/A 03/12/2023   Procedure: REMOVAL OF IMPELLA LEFT VENTRICULAR ASSIST DEVICE;  Surgeon: Kerrin Elspeth JAYSON, MD;  Location: [facility] OR;  Service: Open Heart Surgery;  Laterality: N/A;   RIGHT/LEFT HEART CATH AND CORONARY ANGIOGRAPHY N/A 02/13/2023   Procedure: RIGHT/LEFT HEART CATH AND CORONARY ANGIOGRAPHY;  Surgeon: Mady Bruckner, MD;  Location: [facility] INVASIVE CV LAB;  Service: Cardiovascular;  Laterality: N/A;   TEE WITHOUT CARDIOVERSION N/A 03/01/2023    Procedure: TRANSESOPHAGEAL ECHOCARDIOGRAM (TEE);  Surgeon: Kerrin Elspeth JAYSON, MD;  Location: [facility] OR;  Service: Open Heart Surgery;  Laterality: N/A;   TEE WITHOUT CARDIOVERSION N/A 03/12/2023   Procedure: TRANSESOPHAGEAL ECHOCARDIOGRAM (TEE);  Surgeon: Kerrin Elspeth JAYSON, MD;  Location: [facility] OR;  Service: Open Heart Surgery;  Laterality: N/A;    Assessment / Plan / Recommendation Clinical Impression Walter Vargas is a 78 year old right-handed male with PMH of HTN, 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  Presented 02/10/2023 with increasing shortness of breath over the last few months as well as chest tightness and abdominal discomfort.  Family had noted some recent AMS.  In the ED, BNP was 4500 high-sensitivity troponin 13,600-13,871.  EKG revealed sinus rhythm at 96 bpm with LVH and secondary repolarization abnormality.  Cranial CT scan negative for acute changes.  CT of the chest was notable for pulmonary edema and small pleural effusions.  He was given IV Lasix  and IV heparin  and cardiology services consulted. He had catheterization completed showing left ventricular ejection fraction of less than 20% with global hypokinesis and mild concentric LV hypertrophy.  RV function was moderately reduced.  There was moderate mitral insufficiency and moderate tricuspid insufficiency.  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  Vascular surgery follow-up as an outpatient recommended. Initial plan for surgical intervention was delayed due to COVID-19 infection, treatment has been completed.  Patient underwent insertion of Impella ventricular assistive device 03/01/2023 per Dr. 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 Kerrin, complicated by postoperative bleeding with reexploration 03/02/2023.  Pt did require MV  post op. Postoperative chest tubes have since been removed.  Impella removed 12/30.  Hospital  course placed on Lovenox  03/07/2023 for DVT prophylaxis venous doppler negative.  Acute blood loss anemia 9.5 after transfusion receiving 4 units FFP 2 units cryo and 2 platelets and monitored.  Initially placed on Zosyn  12/25 for low-grade fever suspect aspiration pneumonia.  Develop some cellulitis from right vein harvest site after CABG, completing course of Ancef .  Bouts of agitation and restlessness placed on Seroquel .  He is currently on dysphagia #2 thin liquid diet.  Therapy evaluations were completed and pt was recommended for a comprehensive rehab program.   Swallowing: Patient continues to present with clinical s/sx concurrent with oropharyngeal dysphagia appreciated on MBS conducted 03/19/23. No remarkable weakness appreciated on OME. During trials of regular solids, puree, and thin liquids, patient required frequent reminders to utilize recommended throat clear/reswallow strategy but otherwise tolerated all consistencies fairly well. With regular solids, patient demonstrated prolonged oral preparation and transit as well as delayed throat clear/cough x3. Recommend continuation of Dys2/thin liquid diet with full supervision to cue for compensatory strategies and meds administered crushed in puree. SLP will continue to follow to target diet upgrade and consistent use of swallowing strategies.   Cognitive-Linguistic: Patient presents with significant cognitive deficits in the areas of memory, attention, orientation, awareness, problem solving, and executive functioning as demonstrated throughout functional session tasks and more objectively on SLUMS examination (score: 8/30). Difficult to determine patient's baseline level of function as family not present to corroborate, though patient does feel that his cognitive function has worsened this admission compared to his baseline. Patient required total reeducation of swallowing strategies and frequent explanation of why pills are recommended to be crushed  in puree. Speech and language are judged to be [facility] by SLP and patient.   Patient would benefit from further SLP services to target all aforementioned deficits. Patient was left in chair with call bell in reach and chair alarm set. SLP will continue to target goals per plan of care.     Skilled Therapeutic Interventions          SLP conducted skilled evaluation session to assess swallowing, speech, language, and cognition utilizing bedside swallow evaluation, patient interview, and SLUMS examination. Full results above.    SLP Assessment  Patient will need skilled Speech Lanaguage Pathology Services during CIR admission    Recommendations  SLP Diet Recommendations: Dysphagia 2 (Fine chop);Thin Liquid Administration via: Cup;Straw Medication Administration: Crushed with puree Supervision: Staff to assist with self feeding;Full supervision/cueing for compensatory strategies Compensations: Minimize environmental distractions;Slow rate;Small sips/bites;Clear throat intermittently Postural Changes and/or Swallow Maneuvers: Seated upright 90 degrees Oral Care Recommendations: Oral care BID Recommendations for Other Services: Neuropsych consult Patient destination: Home Follow up Recommendations: Home Health SLP;24 hour supervision/assistance Equipment Recommended: None recommended by SLP    SLP Frequency 3 to 5 out of 7 days   SLP Duration  SLP Intensity  SLP Treatment/Interventions 10-14 days  Minumum of 1-2 x/day, 30 to 90 minutes  Cognitive remediation/compensation;Environmental controls;Cueing hierarchy;Functional tasks;Therapeutic Activities;Therapeutic Exercise;Internal/external aids;Medication managment;Patient/family education;Dysphagia/aspiration precaution training    Pain Pain Assessment Pain Scale: 0-10 Pain Score: 0-No pain  Prior Functioning Cognitive/Linguistic Baseline: Within functional limits Type of Home: Apartment  Lives With: Alone Available Help at Discharge:  Family (family does not live nearby, closet family is 3 hours away per patient.) Education: college degree in nurse, children's Vocation: Retired  SLP Evaluation Cognition Overall Cognitive Status: Impaired/Different from baseline Arousal/Alertness: Awake/alert Orientation Level: Oriented to person;Oriented  to place;Oriented to situation Year:  (2005) Month: January Day of Week: Correct Attention: Sustained Sustained Attention: Impaired Sustained Attention Impairment: Verbal basic;Functional basic Memory: Impaired Memory Impairment: Decreased recall of new information Awareness: Impaired Awareness Impairment: Intellectual impairment;Emergent impairment;Anticipatory impairment Problem Solving: Impaired Problem Solving Impairment: Verbal basic;Functional basic Executive Function: Self Monitoring;Self Correcting;Organizing Organizing: Impaired Organizing Impairment: Verbal basic;Functional basic Self Monitoring: Impaired Self Monitoring Impairment: Verbal basic;Functional basic Self Correcting: Impaired Self Correcting Impairment: Verbal basic;Functional basic Safety/Judgment: Impaired Comments: suspect baseline deficits, however family not present to corroborate.  Comprehension Auditory Comprehension Overall Auditory Comprehension: Appears within functional limits for tasks assessed Expression Expression Primary Mode of Expression: Verbal Verbal Expression Overall Verbal Expression: Appears within functional limits for tasks assessed Written Expression Dominant Hand: Right Oral Motor Oral Motor/Sensory Function Overall Oral Motor/Sensory Function: Within functional limits Motor Speech Overall Motor Speech: Appears within functional limits for tasks assessed  Care Tool Care Tool Cognition Ability to hear (with hearing aid or hearing appliances if normally used Ability to hear (with hearing aid or hearing appliances if normally used): 0. Adequate - no difficulty in normal  conservation, social interaction, listening to TV   Expression of Ideas and Wants Expression of Ideas and Wants: 4. Without difficulty (complex and basic) - expresses complex messages without difficulty and with speech that is clear and easy to understand   Understanding Verbal and Non-Verbal Content Understanding Verbal and Non-Verbal Content: 3. Usually understands - understands most conversations, but misses some part/intent of message. Requires cues at times to understand  Memory/Recall Ability Memory/Recall Ability : Location of own room;That he or she is in a hospital/hospital unit   Bedside Swallowing Assessment General Date of Onset: 02/14/23 Previous Swallow Assessment: MBS completed 03/19/23 Diet Prior to this Study: Dysphagia 2 (finely chopped);Thin liquids (Level 0) Temperature Spikes Noted: No Respiratory Status: Room air History of Recent Intubation: Yes Total duration of intubation (days): 2 days Date extubated: 03/12/23 Behavior/Cognition: Alert;Confused;Cooperative;Pleasant mood Oral Cavity - Dentition: Missing dentition Self-Feeding Abilities: Able to feed self Patient Positioning: Upright in chair/Tumbleform Baseline Vocal Quality: Normal Volitional Cough: Weak Volitional Swallow: Able to elicit  Oral Care Assessment Oral Assessment  (WDL): Exceptions to WDL Lips: Symmetrical Teeth: Missing (Comment) Tongue: Pink;Moist Mucous Membrane(s): Pink;Moist Saliva: Moist, saliva free flowing Level of Consciousness: Alert Is patient on any of following O2 devices?: None of the above Nutritional status: On tube feedings Oral Assessment Risk : High Risk Ice Chips Ice chips: Not tested Thin Liquid Thin Liquid: Within functional limits Presentation: Cup;Self Fed;Straw Nectar Thick Nectar Thick Liquid: Not tested Honey Thick Honey Thick Liquid: Not tested Puree Puree: Within functional limits Presentation: Self Fed;Spoon Solid Solid: Impaired Presentation: Self  Fed Oral Phase Impairments: Impaired mastication Oral Phase Functional Implications: Impaired mastication;Prolonged oral transit Pharyngeal Phase Impairments: Throat Clearing - Delayed BSE Assessment Suspected Esophageal Findings Suspected Esophageal Findings: Belching Risk for Aspiration Impact on safety and function: Mild aspiration risk Other Related Risk Factors: Deconditioning;Cognitive impairment  Short Term Goals: Week 1: SLP Short Term Goal 1 (Week 1): Patient will orient to date and time using available external aids with max assist. SLP Short Term Goal 2 (Week 1): Patient will utilize external memory aids to recall daily information with 75% accuracy given max assist. SLP Short Term Goal 3 (Week 1): Patient will demonstrate intellectual awareness of deficits as evidenced by stating 2 cognitive and 2 physical deficits with max assist. SLP Short Term Goal 4 (Week 1): Patient will solve basic environmental problems with 75%  accuracy given max assist. SLP Short Term Goal 5 (Week 1): Patient will recall and utilize swallow safety strategy of intermittent throat clear during assessment of least restrictive diet tolerance 90% of the time given mod assist. SLP Short Term Goal 6 (Week 1): Patient will participate in trials of upgraded solids with no overt s/sx of difficulty across two sessions prior to diet upgrade.  Refer to Care Plan for Long Term Goals  Recommendations for other services: Neuropsych  Discharge Criteria: Patient will be discharged from SLP if patient refuses treatment 3 consecutive times without medical reason, if treatment goals not met, if there is a change in medical status, if patient makes no progress towards goals or if patient is discharged from hospital.  The above assessment, treatment plan, treatment alternatives and goals were discussed and mutually agreed upon: by patient  Adriaan Maltese, M.A., CCC-SLP  Rachella Basden A Breaunna Gottlieb 03/21/2023, 11:55 AM

## 2023-03-21 NOTE — Progress Notes (Signed)
 Initial Nutrition Assessment  DOCUMENTATION CODES:   Severe malnutrition in context of chronic illness  INTERVENTION:   -Dysphagia II diet with encouragement.  -Continue tube feeding via Cortrak until patient demonstrates ability to meet >65% EER PO. Vital 1.5 at 55 ml/h x 12 hrs (660 ml per day) Prosource TF20 60 ml daily Provides 1070 kcal, 65 gm protein, 504 ml free water daily  -Continue Ensure Enlive po TID, each supplement provides 350 kcal and 20 grams of protein.  -Continue vitamin C . Zinc  sulfate for micronutrient support to aide in wound healing.    NUTRITION DIAGNOSIS:   Severe Malnutrition related to chronic illness as evidenced by percent weight loss, moderate fat depletion, severe fat depletion, severe muscle depletion.    GOAL:   Patient will meet greater than or equal to 90% of their needs    MONITOR:   PO intake, Weight trends, Supplement acceptance, TF tolerance, Skin, Diet advancement, I & O's, Labs  REASON FOR ASSESSMENT:   Consult Enteral/tube feeding initiation and management  ASSESSMENT: 78 y/o male admitted to acute rehab with debility. Initially admitted to acute hospitalization with increasing shortness of breath on 02/10/23 ongoing for a few months with chest tightness and abdominal discomfort. PMH: HTN, heart failure with reduced ejection fraction, mitral regurgitation, CABG 03/01/23.    11/30: admitted 12/19: s/p CABG and impella 5/5 placement 12/20: back to OR for re-exploration d/t post-op bleeding/ABLA; cortrak placed, trickle TF initiated, extubated 12/26 Diet advanced to Dysphagia 1, Thins per SLP 12/27 Changed to nocturnal TF 12/30 OR for Impella removal, Cortrak inadvertently removed during TEE and replaced postop 01/07-MBSS, diet advanced to dysphagia II (finely chopped), thin  liquids  Intakes recorded 10-50% x 2 meals. Today, patient informs he ate scrambled eggs and sausage about half of both, staff documented 10%.  States  his appetite is good, but does not care for the meals, notes taking the Ensure well. Recent calorie count completed 1/6 showed patient was meeting approximately 50% of estimated needs. Receiving nocturnal tube feeding of Vital 1.5 at 55 mL/hr x 12 hrs with Prosource TF 60 mL daily. Current weight reveals 9% weight loss from 02/10/23.   Medications reviewed and include vitamin C  500 mg daily, colace, PPI, spironolactone , thiamine  100 mg daily, zinc  sulfate 220 mg daily.  Labs reviewed  [employer] Most Recent Value  Orbital Region Moderate depletion  Upper Arm Region Severe depletion  Buccal Region Moderate depletion  Temple Region Severe depletion  Clavicle Bone Region Severe depletion  Clavicle and Acromion Bone Region Severe depletion  Scapular Bone Region Moderate depletion  Dorsal Hand Moderate depletion  Patellar Region Severe depletion  Anterior Thigh Region Severe depletion  Posterior Calf Region Severe depletion  Edema (RD Assessment) None  Hair Reviewed  Eyes Reviewed  Mouth Reviewed  Skin Reviewed  Nails Reviewed         Diet Order:   Diet Order             DIET DYS 2 Room service appropriate? Yes; Fluid consistency: Thin  Diet effective now                   EDUCATION NEEDS:   Not appropriate for education at this time  Skin:  Skin Assessment: Skin Integrity Issues: Skin Integrity Issues:: Stage II, Incisions, Other (Comment) Stage II: mid coccyx Incisions: R leg, chest Other: wound to penis-circumferential  Last BM:  03/20/2023, type 5-large  Height:   Ht Readings from Last 1 Encounters:  03/13/23 5' 8 (1.727 m)    Weight:   Wt Readings from Last 1 Encounters:  03/21/23 57.7 kg    Ideal Body Weight:  70 kg  BMI:  Body mass index is 19.34 kg/m.  Estimated Nutritional Needs:   Kcal:  2000-2200 kcal/day  Protein:  91-105 gm/day  Fluid:  1800-2000 mL/day    Elveria Sable, RDLD Clinical Dietitian If unable to reach,  please contact RD Inpatient secure chat group between 8 am-4 pm daily

## 2023-03-22 DIAGNOSIS — R5381 Other malaise: Secondary | ICD-10-CM | POA: Diagnosis not present

## 2023-03-22 MED ORDER — CHLORHEXIDINE GLUCONATE CLOTH 2 % EX PADS
6.0000 | MEDICATED_PAD | Freq: Two times a day (BID) | CUTANEOUS | Status: DC
  Administered 2023-03-22 – 2023-03-26 (×9): 6 via TOPICAL

## 2023-03-22 MED ORDER — MELATONIN 5 MG PO TABS: 5.0000 mg | ORAL_TABLET | Freq: Every evening | ORAL | Status: DC | PRN

## 2023-03-22 NOTE — Progress Notes (Signed)
 Patient ID: Walter Vargas, male   DOB: 04-23-1945, 78 y.o.   MRN: [id number]  96- Sw spoke with pt son [name] to introduce self, explain role, discuss discharge process, and inform on ELOS. He reports that he lives in [place] , and his sister lives near [place], [place]. States he is unsure on the plan with his aunt [name] and her husband. He states they will all need to discuss, and likely, it will only be a short term solution. States his father could benefit from being here as long as possible because eventually he will be home alone. Discussed resources for additional supports including hiring a engineer, site, and discussed if pt has long term care insurance, and if he has Medicaid. Will discuss with his father if he has Medicaid. SW will follow-up early next week to discuss plan of care.   [name], MSW, LCSW Office: [phone] Cell: [phone] Fax: [phone]

## 2023-03-22 NOTE — Progress Notes (Signed)
 Physical Therapy Session Note  Patient Details  Name: Walter Vargas MRN: [id number] Date of Birth: 1946-03-10  Today's Date: 03/22/2023 PT Individual Time: 0905-1015 PT Individual Time Calculation (min): 70 min   Today's Date: 03/22/2023 PT Individual Time: 8651-8554 PT Individual Time Calculation (min): 57 min    Short Term Goals: Week 1:  PT Short Term Goal 1 (Week 1): Pt will complete bed mobility with CGA. PT Short Term Goal 2 (Week 1): Pt will completes sit to stand with minA and LRAD. PT Short Term Goal 3 (Week 1): Pt will complete bed to chair with minA and LRAD. PT Short Term Goal 4 (Week 1): Pt will ambulate x50' with minA and LRAD.  Skilled Therapeutic Interventions/Progress Updates:     1st Session: Pt received seated in [name] and agrees to therapy. Reports some pain in Rt knee, chronic in nature. PT provides rest breaks, mobility, and repositioning to manage pain. WC transport to gym. Pt performs x10 LAQs with BLEs and x10 knee raises prior to mobility. PT provides cues for NM feedback and correct performance. Pt stands with modA and cues for hand placement, body mechanics, and sequencing. Pt performs x10 heels raises in standing with RW for upper extremity support, then completes x20 marche sin place, alternating. Seated rest break. Pt performs 2nd set of heel raises and marches, with demonstration from PT on maintaining wider base of support for stability.   Pt stands with modA and same cues, then ambulates x35' with RW and minA, with cues to increase proximity to RW for safety, and increasing Rt step height and stride length to decrease risk for falls. Pt states he is walking that way because my leg hurts. Pt ambulates additional bouts of x45' and x108' with +2 WC follow for safety and extended seated rest break. PT provides cues for pursed lip breathing to optimize oxygen sats.   WC transport back to room. Left seated in WC with alarm intact and all needs within reach.   2nd  Session: Pt received seated in [name] and agrees to therapy. Reports chronic pain in RLE. Number not provided. PT provides rest breaks and mobility to manage pain. Pt performs sit to stand with modA and cues for anterior weight shift, then pulls pants up in standing with posterior bias noted. Seated rest break. Pt stands with arms across chest to maintain sternal precautions, requiring modA to shift weight anteriorly. Pt ambulates to toilet with RW and minA/modA, then transfers to toilet with modA. Pt ambulates back to WC with miNA/modA and same cues. WC transport to gym.   Pt performs block practice of sit to stand transfer to work on strengthening, functional transfer training, and balance training. PT demonstrates sequencing and adequate anterior weight shift to successfully stand up. Pt requires heavy modA to complete, with seemingly increased posterior bias relative to previously in session. Despite cues to extend hips and stand up tall, pt continues to have posterior lean. Pt completes x2 with extended seated rest break and verbalizing fatigue. Pt then reports need for BM. Pt performs ambulatory transfer to toilet with modA and increased cues for positioning and safety. Pt is continent of bowel. Sit to stand from toilet with maxA for anterior weight shifting and powering up. PT assists with pericare and pt performs ambulatory transfer back to [name] with modA. Left seated with alarm intact and call bell in reach.    Therapy Documentation Precautions:  Precautions Precautions: Fall, Sternal, Other (comment) Precaution Booklet Issued: No Precaution Comments:  cortrak, sternal precautions Restrictions Weight Bearing Restrictions Per Provider Order: No  Therapy/Group: Individual Therapy  Walter Vargas, PT, DTaP  03/22/2023, 4:03 PM

## 2023-03-22 NOTE — Progress Notes (Signed)
 Occupational Therapy Session Note  Patient Details  Name: Walter Vargas MRN: [id number] Date of Birth: [birth year]  Today's Date: 03/22/2023 OT Individual Time: 1115-1202 OT Individual Time Calculation (min): 47 min    Short Term Goals: Week 1:  OT Short Term Goal 1 (Week 1): Pt will don a shirt with min A OT Short Term Goal 2 (Week 1): Pt will complete LB dressing with min A OT Short Term Goal 3 (Week 1): Pt will complete toilet transfer with min A OT Short Term Goal 4 (Week 1): Pt will complete ADL routine with no more than 1 rest break to demo increased activity tolerance  Skilled Therapeutic Interventions/Progress Updates:  Pt greeted seated in w/c, pt agreeable to OT intervention. Pt AxOx4 however pt generally disoriented during session asking where we are going and stating that he was in the gym in [place] earlier today.   Transfers/bed mobility/functional mobility: pt completed functional ambulation greater than a household distance with RW and MIN a with chair follow. Pt requires MIN cues for RW proximity and directional assist as pt stating 'where are we going   Therapeutic activity: pt insistent on wanting to pull up on RW however d/t sternal precautions practiced sit>stands with BUEs positioned across pts chest. Pt needed MODA to rise into standing with no UE support.   Practiced standing tolerance for ADL participation with pt able to stand to match cards with unilateral support and CGA for ~ 3 mins before needing to sit.   Education: educated on sternal precautions and implications related to ADL participation, pt with great difficulty carrying over education functionally into mobility.   Ended session with pt seated in w/c with all needs within reach and safety belt alarm activated.                    Therapy Documentation Precautions:  Precautions Precautions: Fall, Sternal, Other (comment) Precaution Booklet Issued: No Precaution Comments: cortrak, sternal  precautions Restrictions Weight Bearing Restrictions Per Provider Order: No  Pain: Unrated pain reported in L knee, rest breaks provided as needed.    Therapy/Group: Individual Therapy  Ronal Mallie Needy 03/22/2023, 12:31 PM

## 2023-03-22 NOTE — Progress Notes (Signed)
 Speech Language Pathology Daily Session Note  Patient Details  Name: Walter Vargas MRN: [id number] Date of Birth: 06/19/1945  Today's Date: 03/22/2023 SLP Individual Time: 8964-8884 SLP Individual Time Calculation (min): 40 min  Short Term Goals: Week 1: SLP Short Term Goal 1 (Week 1): Patient will orient to date and time using available external aids with max assist. SLP Short Term Goal 2 (Week 1): Patient will utilize external memory aids to recall daily information with 75% accuracy given max assist. SLP Short Term Goal 3 (Week 1): Patient will demonstrate intellectual awareness of deficits as evidenced by stating 2 cognitive and 2 physical deficits with max assist. SLP Short Term Goal 4 (Week 1): Patient will solve basic environmental problems with 75% accuracy given max assist. SLP Short Term Goal 5 (Week 1): Patient will recall and utilize swallow safety strategy of intermittent throat clear during assessment of least restrictive diet tolerance 90% of the time given mod assist. SLP Short Term Goal 6 (Week 1): Patient will participate in trials of upgraded solids with no overt s/sx of difficulty across two sessions prior to diet upgrade.  Skilled Therapeutic Interventions: SLP conducted skilled therapy session targeting cognitive retraining goals. Patient oriented to date utilizing phone external aid with supervision. SLP facilitated basic map interpretation task with patient benefiting from mod-max multimodal cues overall including prompt repetition, task breakdown, and additional visual aids. Patient interpreted daily schedule with supervision. Patient demonstrates improving awareness of cognitive deficits, stating that his thinking skills are far off from his normal. After map task, patient endorses that the cognitive tasks make him way more exhausted than physical therapy tasks, and expresses dislike for tasks though does participate readily. Patient was left in chair with call bell in  reach and chair alarm set. SLP will continue to target goals per plan of care.       Pain Pain Assessment Pain Scale: 0-10 Pain Score: 0-No pain  Therapy/Group: Individual Therapy  Walter Vargas, M.A., CCC-SLP  Walter Vargas 03/22/2023, 12:44 PM

## 2023-03-22 NOTE — Progress Notes (Signed)
 PROGRESS NOTE   Subjective/Complaints:  No events overnight.  No acute complaints.   Patient into inquiring about his therapy schedules Some ongoing pain in his vein graft site, but otherwise doing well.  ROS: Denies fevers, chills, N/V, abdominal pain, constipation, diarrhea, SOB, cough, chest pain, new weakness or paraesthesias.    Objective:   No results found.  Recent Labs    03/21/23 0511  WBC 8.0  HGB 10.5*  HCT 33.5*  PLT 698*   Recent Labs    03/20/23 0330 03/21/23 0511  NA 131* 130*  K 4.6 4.6  CL 99 99  CO2 23 19*  GLUCOSE 128* 115*  BUN 37* 37*  CREATININE 0.79 0.88  CALCIUM  8.4* 8.4*    Intake/Output Summary (Last 24 hours) at 03/22/2023 1530 Last data filed at 03/22/2023 1500 Gross per 24 hour  Intake 2600 ml  Output 900 ml  Net 1700 ml     Pressure Injury 03/15/23 Coccyx Mid;Lower Stage 2 -  Partial thickness loss of dermis presenting as a shallow open injury with a red, pink wound bed without slough. (Active)  03/15/23 0800  Location: Coccyx  Location Orientation: Mid;Lower  Staging: Stage 2 -  Partial thickness loss of dermis presenting as a shallow open injury with a red, pink wound bed without slough.  Wound Description (Comments):   Present on Admission: Yes    Physical Exam: Vital Signs Blood pressure (!) 117/56, pulse 61, temperature 97.6 F (36.4 C), temperature source Oral, resp. rate 17, weight 60.4 kg, SpO2 99%.   PE: Constitution: Appropriate appearance for age. No apparent distress.  Underweight.  Up in wheelchair. Resp: No respiratory distress. No accessory muscle usage. on RA and CTAB.  Positive productive cough with small amount of yellow sputum. Cardio: Well perfused appearance.  Trace bilateral peripheral edema. Abdomen: Nondistended. Nontender.  + Core track Psych: Appropriate mood and affect. Skin: Bilateral upper extremity and ecchymosis, IV C/D/I, Mepilex on prior  arterial line site.  + Right inner thigh/calf with scabbing from vein harvest site and lots of bruising/swelling over and just distal to site.  Single area of engorged hematoma.  Area of redness around right calf within margins of prior marking.   Neurologic Exam:  Awake, alert, and oriented x 4.  Moderate memory and higher cognitive deficits.  Follows all simple commands.  No apparent impulsivity.  Language intact.  Cranial nerves II through XII intact.  Sensation intact.  No fine motor deficits.  No ataxia. Insight: Fair insight into current condition  Strength: Antigravity against resistance all 4 extremities 1-9    Assessment/Plan: 1. Functional deficits which require 3+ hours per day of interdisciplinary therapy in a comprehensive inpatient rehab setting. Physiatrist is providing close team supervision and 24 hour management of active medical problems listed below. Physiatrist and rehab team continue to assess barriers to discharge/monitor patient progress toward functional and medical goals  Care Tool:  Bathing    Body parts bathed by patient: Right arm, Left arm, Chest, Abdomen, Front perineal area, Buttocks, Right upper leg, Left upper leg, Face   Body parts bathed by helper: Right lower leg, Left lower leg     Bathing assist  Assist Level: Moderate Assistance - Patient 50 - 74%     Upper Body Dressing/Undressing Upper body dressing   What is the patient wearing?: Pull over shirt    Upper body assist Assist Level: Maximal Assistance - Patient 25 - 49%    Lower Body Dressing/Undressing Lower body dressing      What is the patient wearing?: Pants, Underwear/pull up     Lower body assist Assist for lower body dressing: Maximal Assistance - Patient 25 - 49%     Toileting Toileting    Toileting assist Assist for toileting: Maximal Assistance - Patient 25 - 49%     Transfers Chair/bed transfer  Transfers assist     Chair/bed transfer assist level: Moderate  Assistance - Patient 50 - 74%     Locomotion Ambulation   Ambulation assist      Assist level: 2 helpers Assistive device: No Device Max distance: 5'   Walk 10 feet activity   Assist  Walk 10 feet activity did not occur: Safety/medical concerns  Assist level:  (able to ambulate with RW, but at basline did not use an AD)     Walk 50 feet activity   Assist Walk 50 feet with 2 turns activity did not occur: Safety/medical concerns         Walk 150 feet activity   Assist Walk 150 feet activity did not occur: Safety/medical concerns         Walk 10 feet on uneven surface  activity   Assist Walk 10 feet on uneven surfaces activity did not occur: Safety/medical concerns         Wheelchair     Assist Is the patient using a wheelchair?: Yes Type of Wheelchair: Manual Wheelchair activity did not occur:  (Sternal precautions)  Wheelchair assist level: Dependent - Patient 0% Max wheelchair distance: 150'    Wheelchair 50 feet with 2 turns activity    Assist    Wheelchair 50 feet with 2 turns activity did not occur: Safety/medical concerns       Wheelchair 150 feet activity     Assist  Wheelchair 150 feet activity did not occur: Safety/medical concerns       Blood pressure (!) 117/56, pulse 61, temperature 97.6 F (36.4 C), temperature source Oral, resp. rate 17, weight 60.4 kg, SpO2 99%.  Medical Problem List and Plan: 1. 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  Sternal precautions             -patient may  shower if cover PICC< etc             -ELOS/Goals: 14-18 days supervision to mod I  -Stable to continue CIR  2.  Antithrombotics: -DVT/anticoagulation:  Pharmaceutical: Lovenox .  Venous Doppler studies negative             -antiplatelet therapy: Aspirin  81 mg daily.  Heart failure team considering adding Plavix --will need to  check back on this prior to discharge  3. Pain Management: Oxycodone  as needed--no complaints 4. Mood/Behavior/Sleep: Provide emotional support             -antipsychotic agents: Seroquel  25 mg daily at 1000 and 50 mg nightly   - Continue current regimen, can add melatonin 5 mg as needed  5. Neuropsych/cognition: This patient is capable of making decisions on his own behalf.  -Cognitive deficits with memory/processing: Working with SLP  6. Skin/Wound Care: Routine skin checks. His right lower extremity harvest  site completing course of Ancef  for cellulitis --1/8: transitioned to Augmentin  1-8 to complete 10-day course--appearance is improving 1-9   7. Fluids/Electrolytes/Nutrition: Routine in and outs with follow-up chemistries  -1-8: Low albumin , protein; continue current supplemental tube feeds as below.  Encourage protein and p.o. intakes.  Very mild hyponatremia, will recheck on Friday.  8.  Acute blood loss anemia.  Follow-up CBC -stable 9.  COVID-positive.  Completed Paxlovid  10.  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  Seen by vascular surgery Dr Norman Serve and plan outpatient follow-up 1 month 11.  History of VT peri-CABG.  Amiodarone  200 mg daily, Lanoxin  0.125 mg daily 12.  Hyperlipidemia.  Lipitor  13.  Diastolic congestive heart failure.  Lasix  20 mg daily, Cozaar  25 mg daily, Aldactone  25 mg daily.  Monitor for any signs of fluid overload.  Daily weights.  -1-8: No external signs of volume overload.  Heart failure team evaluated, appreciate recommendations and management.  Close monitoring while switching over to p.o. diuretics - lasix  40 mg daily -1/9: Significant recorded weight gain overnight, however no external signs of volume overload. I/Os -500 yesterday. Weight Likely measurement error, monitor for trend Filed Weights   03/21/23 0500 03/22/23 0500  Weight: 57.7 kg 60.4 kg    14.  Sacral wound.  Wound care nurse follow-up.   Covered with foam dressing  15.  Dysphagia.  Dysphagia #2 thin liquids.  Nasogastric tube for nutritional support. Just upgraded diet- might not need Cortrak long -Continue supplemental tube feeds as above, encourage p.o. intakes, protein.      LOS: 2 days A FACE TO FACE EVALUATION WAS PERFORMED  Joesph JAYSON Likes 03/22/2023, 3:30 PM

## 2023-03-22 NOTE — Care Management (Signed)
 Inpatient Rehabilitation Center Individual Statement of Services  Patient Name:  Walter Vargas  Date:  03/22/2023  Welcome to the Inpatient Rehabilitation Center.  Our goal is to provide you with an individualized program based on your diagnosis and situation, designed to meet your specific needs.  With this comprehensive rehabilitation program, you will be expected to participate in at least 3 hours of rehabilitation therapies Monday-Friday, with modified therapy programming on the weekends.  Your rehabilitation program will include the following services:  Physical Therapy (PT), Occupational Therapy (OT), Speech Therapy (ST), 24 hour per day rehabilitation nursing, Therapeutic Recreaction (TR), Psychology, Neuropsychology, Care Coordinator, Rehabilitation Medicine, Nutrition Services, Pharmacy Services, and Other  Weekly team conferences will be held on Tuesdays to discuss your progress.  Your Inpatient Rehabilitation Care Coordinator will talk with you frequently to get your input and to update you on team discussions.  Team conferences with you and your family in attendance may also be held.  Expected length of stay: 10-17 days    Overall anticipated outcome: Supervision  Depending on your progress and recovery, your program may change. Your Inpatient Rehabilitation Care Coordinator will coordinate services and will keep you informed of any changes. Your Inpatient Rehabilitation Care Coordinator's name and contact numbers are listed  below.  The following services may also be recommended but are not provided by the Inpatient Rehabilitation Center:  Driving Evaluations Home Health Rehabiltiation Services Outpatient Rehabilitation Services Vocational Rehabilitation   Arrangements will be made to provide these services after discharge if needed.  Arrangements include referral to agencies that provide these services.  Your insurance has been verified to be:  [facility] Medicare Dual Complete  Your  primary doctor is:  Lynwood Crandall  Pertinent information will be shared with your doctor and your insurance company.  Inpatient Rehabilitation Care Coordinator:  Graeme Feliciana SILK [phone] or ([phone]  Information discussed with and copy given to patient by: Graeme DELENA Feliciana, 03/22/2023, 9:59 AM

## 2023-03-23 DIAGNOSIS — R5381 Other malaise: Secondary | ICD-10-CM | POA: Diagnosis not present

## 2023-03-23 LAB — BASIC METABOLIC PANEL
Anion gap: 7 (ref 5–15)
BUN: 30 mg/dL — ABNORMAL HIGH (ref 8–23)
CO2: 24 mmol/L (ref 22–32)
Calcium: 8.4 mg/dL — ABNORMAL LOW (ref 8.9–10.3)
Chloride: 98 mmol/L (ref 98–111)
Creatinine, Ser: 0.86 mg/dL (ref 0.61–1.24)
GFR, Estimated: >60 mL/min (ref >60)
Glucose, Bld: 121 mg/dL — ABNORMAL HIGH (ref 70–99)
Potassium: 4.9 mmol/L (ref 3.5–5.1)
Sodium: 129 mmol/L — ABNORMAL LOW (ref 135–145)

## 2023-03-23 MED ORDER — TORSEMIDE 20 MG PO TABS
20.0000 mg | ORAL_TABLET | Freq: Every day | ORAL | Status: DC
  Administered 2023-03-23 – 2023-04-06 (×15): 20 mg via ORAL
  Filled 2023-03-23 (×15): qty 1

## 2023-03-23 NOTE — Progress Notes (Signed)
      [address]       [place] [address]             [phone]       Up in chair, seems upbeat  BP 127/60 (BP Location: Right Arm)   Pulse 61   Temp 97.8 F (36.6 C) (Oral)   Resp 16   Wt 59.9 kg   SpO2 98%   BMI 20.08 kg/m   Sternal incision well healed Right leg incision with a seroma in calf, surrounding erythema improved but resolved  Elspeth C. Kerrin, MD [facility] [phone]

## 2023-03-23 NOTE — Progress Notes (Signed)
 PROGRESS NOTE   Subjective/Complaints:  No events overnight.  No acute complaints.   Continues to fixate on Dr. Kerrin.  States he is being well taking care of by their team.  No concerns today, states he slept much better overnight with minimal interruptions and is doing well with therapies today.  Granddaughter at bedside, no complaints or concerns today.  Patient states he was told by someone that core track would likely be removed today, states that he eats about 1-1-1/2 plates of food per day, but this is only slightly less than what he eats at home.  ROS: Denies fevers, chills, N/V, abdominal pain, constipation, diarrhea, SOB, cough, chest pain, new weakness or paraesthesias.    Objective:   No results found.  Recent Labs    03/21/23 0511  WBC 8.0  HGB 10.5*  HCT 33.5*  PLT 698*   Recent Labs    03/21/23 0511 03/23/23 0419  NA 130* 129*  K 4.6 4.9  CL 99 98  CO2 19* 24  GLUCOSE 115* 121*  BUN 37* 30*  CREATININE 0.88 0.86  CALCIUM  8.4* 8.4*    Intake/Output Summary (Last 24 hours) at 03/23/2023 2316 Last data filed at 03/23/2023 2156 Gross per 24 hour  Intake 554 ml  Output 725 ml  Net -171 ml     Pressure Injury 03/15/23 Coccyx Mid;Lower Stage 2 -  Partial thickness loss of dermis presenting as a shallow open injury with a red, pink wound bed without slough. (Active)  03/15/23 0800  Location: Coccyx  Location Orientation: Mid;Lower  Staging: Stage 2 -  Partial thickness loss of dermis presenting as a shallow open injury with a red, pink wound bed without slough.  Wound Description (Comments):   Present on Admission: Yes    Physical Exam: Vital Signs Blood pressure 128/70, pulse 60, temperature 98.1 F (36.7 C), resp. rate 16, weight 59.9 kg, SpO2 100%.   PE: Constitution: Appropriate appearance for age. No apparent distress.  Sitting up at bedside chair.  Resp: No respiratory distress.  No accessory muscle usage. on RA and CTAB.  Positive productive cough with small amount of yellow sputum. Cardio: Well perfused appearance.  Trace bilateral peripheral edema--unchanged. Abdomen: Nondistended. Nontender.  + Core track Psych: Appropriate mood and affect. Skin: Bilateral upper extremity and ecchymosis, IV C/D/I,  + Mepilex on prior arterial line site. --Clean + Right inner thigh/calf with scabbing from vein harvest site and lots of bruising/swelling over and just distal to site with overlying hematoma--much improved, marked area much reduced from last exam + Bilateral inner ankle punctate lesions, with overlying dried blood. + Sternal incision well-approximated, healed  Neurologic Exam:  Awake, alert, and oriented x 4.  Moderate memory and higher cognitive deficits.    Follows all simple commands.  No apparent impulsivity.  Language intact.  Cranial nerves II through XII intact.  Sensation intact.  No fine motor deficits.  No ataxia. Insight: Fair insight into current condition  Strength: Antigravity against resistance all 4 extremities grossly 5- out of 5, unchanged 1-10    Assessment/Plan: 1. Functional deficits which require 3+ hours per day of interdisciplinary therapy in a comprehensive inpatient rehab setting. Physiatrist  is providing close team supervision and 24 hour management of active medical problems listed below. Physiatrist and rehab team continue to assess barriers to discharge/monitor patient progress toward functional and medical goals  Care Tool:  Bathing    Body parts bathed by patient: Right arm, Left arm, Chest, Abdomen, Front perineal area, Buttocks, Right upper leg, Left upper leg, Face   Body parts bathed by helper: Right lower leg, Left lower leg     Bathing assist Assist Level: Moderate Assistance - Patient 50 - 74%     Upper Body Dressing/Undressing Upper body dressing   What is the patient wearing?: Pull over shirt    Upper body assist  Assist Level: Maximal Assistance - Patient 25 - 49%    Lower Body Dressing/Undressing Lower body dressing      What is the patient wearing?: Pants, Underwear/pull up     Lower body assist Assist for lower body dressing: Maximal Assistance - Patient 25 - 49%     Toileting Toileting    Toileting assist Assist for toileting: Moderate Assistance - Patient 50 - 74%     Transfers Chair/bed transfer  Transfers assist     Chair/bed transfer assist level: Moderate Assistance - Patient 50 - 74%     Locomotion Ambulation   Ambulation assist      Assist level: 2 helpers Assistive device: No Device Max distance: 5'   Walk 10 feet activity   Assist  Walk 10 feet activity did not occur: Safety/medical concerns  Assist level:  (able to ambulate with RW, but at basline did not use an AD)     Walk 50 feet activity   Assist Walk 50 feet with 2 turns activity did not occur: Safety/medical concerns         Walk 150 feet activity   Assist Walk 150 feet activity did not occur: Safety/medical concerns         Walk 10 feet on uneven surface  activity   Assist Walk 10 feet on uneven surfaces activity did not occur: Safety/medical concerns         Wheelchair     Assist Is the patient using a wheelchair?: Yes Type of Wheelchair: Manual Wheelchair activity did not occur:  (Sternal precautions)  Wheelchair assist level: Dependent - Patient 0% Max wheelchair distance: 150'    Wheelchair 50 feet with 2 turns activity    Assist    Wheelchair 50 feet with 2 turns activity did not occur: Safety/medical concerns       Wheelchair 150 feet activity     Assist  Wheelchair 150 feet activity did not occur: Safety/medical concerns       Blood pressure 128/70, pulse 60, temperature 98.1 F (36.7 C), resp. rate 16, weight 59.9 kg, SpO2 100%.  Medical Problem List and Plan: 1. 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  Sternal precautions             -patient may  shower if cover PICC< etc             -ELOS/Goals: 14-18 days supervision to mod I  -Stable to continue CIR  2.  Antithrombotics: -DVT/anticoagulation:  Pharmaceutical: Lovenox .  Venous Doppler studies negative             -antiplatelet therapy: Aspirin  81 mg daily.  Heart failure team considering adding Plavix --will need to check back on this prior to discharge  3. Pain Management: Oxycodone  as needed--no  complaints 4. Mood/Behavior/Sleep: Provide emotional support             -antipsychotic agents: Seroquel  25 mg daily at 1000 and 50 mg nightly   - Continue current regimen, can add melatonin 5 mg as needed  5. Neuropsych/cognition: This patient is capable of making decisions on his own behalf.  -Cognitive deficits with memory/processing: Working with SLP  6. Skin/Wound Care: Routine skin checks. His right lower extremity harvest site completing course of Ancef  for cellulitis --1/8: transitioned to Augmentin  1-8 to complete 10-day course--appearance is improving   7. Fluids/Electrolytes/Nutrition/hyponatremia: Routine in and outs with follow-up chemistries  -1-8: Low albumin , protein; continue current supplemental tube feeds as below.  Encourage protein and p.o. intakes.  Very mild hyponatremia, will recheck on Friday.  1-10: Hyponatremia 129, slight downtrend from 130.  Repeat BMP, serum osmol, urine studies in a.m. to confirm etiology--will be somewhat complicated by use of torsemide .  Placed on 1200 cc fluid restriction; patient is remaining well below this.  8.  Acute blood loss anemia.  Follow-up CBC -stable 9.  COVID-positive.  Completed Paxlovid  10.  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  Seen by vascular surgery Dr Norman Serve and plan outpatient follow-up 1 month 11.  History of VT peri-CABG.  Amiodarone  200 mg daily,  Lanoxin  0.125 mg daily 12.  Hyperlipidemia.  Lipitor  13.  Diastolic congestive heart failure.  Lasix  20 mg daily, Cozaar  25 mg daily, Aldactone  25 mg daily.  Monitor for any signs of fluid overload.  Daily weights.  -1-8: No external signs of volume overload.  Heart failure team evaluated, appreciate recommendations and management.  Close monitoring while switching over to p.o. diuretics - lasix  40 mg daily -1/9: Significant recorded weight gain overnight, however no external signs of volume overload. I/Os -500 yesterday. Weight Likely measurement error, monitor for trend 1-10: Weight slightly down/stable.  I's and O's positive due to tube feeds, however tube feeds being discontinued today.  Monitor sodium as above. Filed Weights   03/21/23 0500 03/22/23 0500 03/23/23 0241  Weight: 57.7 kg 60.4 kg 59.9 kg    14.  Sacral wound.  Wound care nurse follow-up.  Covered with foam dressing  15.  Dysphagia.  Dysphagia #2 thin liquids.  Nasogastric tube for nutritional support. Just upgraded diet- might not need Cortrak long -Continue supplemental tube feeds as above, encourage p.o. intakes, protein. -1-10: Eating 60 to 90% of meals per documentation.  Discontinue core track, tube feeds.    LOS: 3 days A FACE TO FACE EVALUATION WAS PERFORMED  Walter Vargas Likes 03/23/2023, 11:16 PM

## 2023-03-23 NOTE — Progress Notes (Signed)
 Physical Therapy Session Note  Patient Details  Name: Walter Vargas MRN: [id number] Date of Birth: 1945/04/25  Today's Date: 03/23/2023 PT Individual Time: 1002-1059 PT Individual Time Calculation (min): 57 min  and Today's Date: 03/23/2023 PT Missed Time: 45 Minutes Missed Time Reason: Patient fatigue  Short Term Goals: Week 1:  PT Short Term Goal 1 (Week 1): Pt will complete bed mobility with CGA. PT Short Term Goal 2 (Week 1): Pt will completes sit to stand with minA and LRAD. PT Short Term Goal 3 (Week 1): Pt will complete bed to chair with minA and LRAD. PT Short Term Goal 4 (Week 1): Pt will ambulate x50' with minA and LRAD.  Skilled Therapeutic Interventions/Progress Updates:     1st Session: Pt received supine in bed and agrees to therapy. NO complaint of pain. Supine to sit with bed features and cues for sequencing. PT assists to don pants and bilateral shoes. Pt performs sit to stand with modA and cue to cross arms over chest to ensure maintenance of sternal precautions. Pt then ambulates x175' to gym with RW and minA, with cues for upright posture, decreasing WB through arm for energy conservation and body mechanics, and increasing stride length to decrease risk for fals. Pt takes extended seated rest break. Pt ambulates x50' to toilet and performs transfer onto standard commode with arms across chest and minA to control eccentric lowering. Following continent bowel movement and voiding of bladder, pt stands with modA and PT provides assistance for pericare. Pt washes hands at sink with CGA and ambulates back to mat table with minA and cues for safe AD management. Pt requests WC transport back to room. Stand step to WC with modA. Pt left seated with alarm intact and all needs within reach.   2nd Session: Pt politely declines therapy at this time due to fatigue. Pt misses 45 minutes of skilled PT. PT will follow up as able.   Therapy Documentation Precautions:   Precautions Precautions: Fall, Sternal, Other (comment) Precaution Booklet Issued: No Precaution Comments: cortrak, sternal precautions Restrictions Weight Bearing Restrictions Per Provider Order: No  Therapy/Group: Individual Therapy  Elsie JAYSON Dawn, PT, DPT 03/23/2023, 4:12 PM

## 2023-03-23 NOTE — Progress Notes (Signed)
 Occupational Therapy Session Note  Patient Details  Name: Walter Vargas MRN: [id number] Date of Birth: 1945/09/12  Today's Date: 03/23/2023 OT Individual Time: 1345-1425 OT Individual Time Calculation (min): 40 min    Short Term Goals: Week 1:  OT Short Term Goal 1 (Week 1): Pt will don a shirt with min A OT Short Term Goal 2 (Week 1): Pt will complete LB dressing with min A OT Short Term Goal 3 (Week 1): Pt will complete toilet transfer with min A OT Short Term Goal 4 (Week 1): Pt will complete ADL routine with no more than 1 rest break to demo increased activity tolerance  Skilled Therapeutic Interventions/Progress Updates:    Pt seen for skilled OT session. Pt up in w/c and c/o fatigue immediately upon OT intro. OT suggested shaving sink side as pt with significant facial hair growth. Pt open to task with ome intermittent standing. Pt re-educated on sternal precautions, RO and pacing as pt with STM deficits. Used white board and log book for recall of tasks. Pt amb to sink side with RW with CGA and OT placed w/c behind pt for 4 intermittent seated rests. Cues for sit to stand safety. Pt able to apply warm compress on face, apply foam cream and briefly trial use of disposable razors. OT assisted for more complex areas of face due to coretrak. Pt reluctant to stand one final time for hair combing but did so with cues and CGA. Pt amb back to bedside area and OT placed w/c behind. Pt left with all needs, nurse call button and chair alarm set. Denied pain throughout session.   Therapy Documentation Precautions:  Precautions Precautions: Fall, Sternal, Other (comment) Precaution Booklet Issued: No Precaution Comments: cortrak, sternal precautions Restrictions Weight Bearing Restrictions Per Provider Order: No   Therapy/Group: Individual Therapy  Walter Vargas 03/23/2023, 4:29 PM

## 2023-03-23 NOTE — Progress Notes (Signed)
 Speech Language Pathology Daily Session Note  Patient Details  Name: Walter Vargas MRN: [id number] Date of Birth: [birth year]  Today's Date: 03/23/2023 SLP Individual Time: 0732-0827 SLP Individual Time Calculation (min): 55 min  Short Term Goals: Week 1: SLP Short Term Goal 1 (Week 1): Patient will orient to date and time using available external aids with max assist. SLP Short Term Goal 2 (Week 1): Patient will utilize external memory aids to recall daily information with 75% accuracy given max assist. SLP Short Term Goal 3 (Week 1): Patient will demonstrate intellectual awareness of deficits as evidenced by stating 2 cognitive and 2 physical deficits with max assist. SLP Short Term Goal 4 (Week 1): Patient will solve basic environmental problems with 75% accuracy given max assist. SLP Short Term Goal 5 (Week 1): Patient will recall and utilize swallow safety strategy of intermittent throat clear during assessment of least restrictive diet tolerance 90% of the time given mod assist. SLP Short Term Goal 6 (Week 1): Patient will participate in trials of upgraded solids with no overt s/sx of difficulty across two sessions prior to diet upgrade.  Skilled Therapeutic Interventions:   Pt seen for skilled SLP session to address dysphagia and cognitive-linguistic goals.   Cognition: Pt needed frequent redirection due to reduced attention and tangential speech throughout session. He was easily redirected with verbal cues. Pt completed safety awareness task x2 with identification of safety hazards in pictured scene with ~75% accuracy independently. Improved to ~90% accuracy given min cues.   Swallowing: Pt participated in therapeutic PO trials of soft solid and regular solid to assess for diet advancement. Oral prep and transit efficient and complete with all trials. No overt or subtle s/s of aspiration observed with trial sof soft solid (fig newton cookie) or with subsequent liquid wash. Observed  occasional, subtle throat clear and isolated delayed cough following trial of cracker + subsequent liquid wash.   Recommend diet advancement to Dys 3 diet with continuation of thin liquids as tolerated. Pt left sitting upright in bed with bed alarm activated and call bell in reach. Continue SLP PoC.   Pain Pain Assessment Pain Scale: 0-10 Pain Score: 0-No pain  Therapy/Group: Individual Therapy  [name] 03/23/2023, 11:35 AM

## 2023-03-23 NOTE — IPOC Note (Signed)
 Overall Plan of Care [facility]) Patient Details Name: Walter Vargas MRN: [id number] DOB: 19-Jan-1946  Admitting Diagnosis: Debility  Hospital Problems: Principal Problem:   Debility Active Problems:   S/P CABG x 5     Functional Problem List: Nursing Skin Integrity, Bowel, Endurance, Medication Management, Nutrition, Safety, Pain, Bladder  PT Balance, Endurance, Motor, Pain, Perception, Safety, Skin Integrity  OT Balance, Safety, Cognition, Endurance, Motor, Vision, Pain  SLP Cognition, Safety, Nutrition  TR         Basic ADL's: OT Bathing, Dressing, Toileting     Advanced  ADL's: OT       Transfers: PT Bed Mobility, Car, Furniture, Bed to Merchant Navy Officer, Technical Brewer: PT Ambulation, Stairs     Additional Impairments: OT None  SLP Swallowing, Social Cognition   Problem Solving, Memory, Attention, Awareness  TR      Anticipated Outcomes Item Anticipated Outcome  Self Feeding no goal  Swallowing  minA   Basic self-care  (S)  Toileting  (S)   Bathroom Transfers (S)  Bowel/Bladder  manage bowel Mod I and bladder Indep  Transfers  Supervision  Locomotion  Supervision  Communication     Cognition  modA  Pain  pain less than 4 with prns  Safety/Judgment  manage safety w/ verbal cues   Therapy Plan: PT Intensity: Minimum of 1-2 x/day ,45 to 90 minutes PT Frequency: 5 out of 7 days PT Duration Estimated Length of Stay: 14-17 Days OT Intensity: Minimum of 1-2 x/day, 45 to 90 minutes OT Frequency: 5 out of 7 days OT Duration/Estimated Length of Stay: 10-14 days SLP Intensity: Minumum of 1-2 x/day, 30 to 90 minutes SLP Frequency: 3 to 5 out of 7 days SLP Duration/Estimated Length of Stay: 10-14 days   Team Interventions: Nursing Interventions Patient/Family Education, Bladder Management, Bowel Management, Disease Management/Prevention, Pain Management, Skin Care/Wound Management, Dysphagia/Aspiration Precaution Training, Discharge  Planning, Medication Management  PT interventions Community reintegration, Ambulation/gait training, DME/adaptive equipment instruction, Neuromuscular re-education, Psychosocial support, Stair training, UE/LE Strength taining/ROM, Warden/ranger, Functional electrical stimulation, Therapeutic Activities, UE/LE Coordination activities, Pain management, Skin care/wound management, Discharge planning, Cognitive remediation/compensation, Disease management/prevention, Patient/family education, Functional mobility training, Splinting/orthotics, Therapeutic Exercise, Visual/perceptual remediation/compensation  OT Interventions Balance/vestibular training, Discharge planning, Pain management, Self Care/advanced ADL retraining, Therapeutic Activities, UE/LE Coordination activities, Therapeutic Exercise, Skin care/wound managment, Patient/family education, Functional mobility training, Cognitive remediation/compensation, Disease mangement/prevention, Community reintegration, Fish Farm Manager, Psychosocial support, UE/LE Strength taining/ROM, Wheelchair propulsion/positioning  SLP Interventions Cognitive remediation/compensation, Environmental controls, Financial trader, Functional tasks, Therapeutic Activities, Therapeutic Exercise, Internal/external aids, Medication managment, Patient/family education, Dysphagia/aspiration precaution training  TR Interventions    SW/CM Interventions Discharge Planning, Psychosocial Support, Patient/Family Education   Barriers to Discharge MD  Medical stability, Home enviroment access/loayout, Wound care, Lack of/limited family support, Insurance for SNF coverage, Behavior, and Nutritional means  Nursing Decreased caregiver support, Home environment access/layout, Wound Care, Weight bearing restrictions 1 level 3 ste / rt rail with sister  PT Lack of/limited family support    OT Decreased caregiver support He states sister will be in and out   SLP   Patient reports that closest family member lives in [place]  SW Decreased caregiver support, Lack of/limited family support, Community Education Officer for SNF coverage, Other (comments) current cortrak   Team Discharge Planning: Destination: PT-Home ,OT- Home , SLP-Home Projected Follow-up: PT-24 hour supervision/assistance, Home health PT, OT-  Home health OT, SLP-Home Health SLP, 24 hour supervision/assistance Projected Equipment Needs: PT-To  be determined, OT- To be determined, SLP-None recommended by SLP Equipment Details: PT- , OT-  Patient/family involved in discharge planning: PT- Patient,  OT-Patient, SLP-Patient  MD ELOS: 14-128 days Medical Rehab Prognosis:  Excellent Assessment: The patient has been admitted for CIR therapies with the diagnosis of Debility due to CABG. The team will be addressing functional mobility, strength, stamina, balance, safety, adaptive techniques and equipment, self-care, bowel and bladder mgt, patient and caregiver education,. Goals have been set at modified independent. Anticipated discharge destination is home.       See Team Conference Notes for weekly updates to the plan of care

## 2023-03-24 DIAGNOSIS — R5381 Other malaise: Secondary | ICD-10-CM | POA: Diagnosis not present

## 2023-03-24 DIAGNOSIS — E871 Hypo-osmolality and hyponatremia: Secondary | ICD-10-CM

## 2023-03-24 LAB — BASIC METABOLIC PANEL
Anion gap: 8 (ref 5–15)
BUN: 33 mg/dL — ABNORMAL HIGH (ref 8–23)
CO2: 25 mmol/L (ref 22–32)
Calcium: 8.5 mg/dL — ABNORMAL LOW (ref 8.9–10.3)
Chloride: 99 mmol/L (ref 98–111)
Creatinine, Ser: 1 mg/dL (ref 0.61–1.24)
GFR, Estimated: >60 mL/min (ref >60)
Glucose, Bld: 82 mg/dL (ref 70–99)
Potassium: 5.1 mmol/L (ref 3.5–5.1)
Sodium: 132 mmol/L — ABNORMAL LOW (ref 135–145)

## 2023-03-24 LAB — OSMOLALITY: Osmolality: 286 mosm/kg (ref 275–295)

## 2023-03-24 MED ORDER — AMOXICILLIN-POT CLAVULANATE 875-125 MG PO TABS
1.0000 | ORAL_TABLET | Freq: Two times a day (BID) | ORAL | Status: DC
  Administered 2023-03-24 – 2023-03-29 (×11): 1 via ORAL
  Filled 2023-03-24 (×11): qty 1

## 2023-03-24 MED ORDER — OXYCODONE HCL 5 MG PO TABS: 5.0000 mg | ORAL_TABLET | ORAL | Status: DC | PRN

## 2023-03-24 MED ORDER — ENOXAPARIN SODIUM 40 MG/0.4ML IJ SOSY
40.0000 mg | PREFILLED_SYRINGE | Freq: Every day | INTRAMUSCULAR | Status: DC
  Administered 2023-03-24 – 2023-04-17 (×18): 40 mg via SUBCUTANEOUS
  Filled 2023-03-24 (×21): qty 0.4

## 2023-03-24 MED ORDER — ENOXAPARIN SODIUM 40 MG/0.4ML IJ SOSY: 40.0000 mg | PREFILLED_SYRINGE | Freq: Every day | INTRAMUSCULAR | Status: DC

## 2023-03-24 NOTE — Progress Notes (Signed)
 PROGRESS NOTE   Subjective/Complaints:  Pt doing ok, slept well, denies pain. LBM yesterday. Urinating ok. Denies any other complaints or concerns today.   ROS: Denies fevers, chills, N/V, abdominal pain, constipation, diarrhea, SOB, cough, chest pain, new weakness or paraesthesias.    Objective:   No results found.  No results for input(s): WBC, HGB, HCT, PLT in the last 72 hours.  Recent Labs    03/23/23 0419 03/24/23 0345  NA 129* 132*  K 4.9 5.1  CL 98 99  CO2 24 25  GLUCOSE 121* 82  BUN 30* 33*  CREATININE 0.86 1.00  CALCIUM  8.4* 8.5*    Intake/Output Summary (Last 24 hours) at 03/24/2023 1018 Last data filed at 03/24/2023 0853 Gross per 24 hour  Intake 668 ml  Output 1025 ml  Net -357 ml     Pressure Injury 03/15/23 Coccyx Mid;Lower Stage 2 -  Partial thickness loss of dermis presenting as a shallow open injury with a red, pink wound bed without slough. (Active)  03/15/23 0800  Location: Coccyx  Location Orientation: Mid;Lower  Staging: Stage 2 -  Partial thickness loss of dermis presenting as a shallow open injury with a red, pink wound bed without slough.  Wound Description (Comments):   Present on Admission: Yes    Physical Exam: Vital Signs Blood pressure 128/68, pulse 62, temperature 97.6 F (36.4 C), resp. rate 18, weight 60.4 kg, SpO2 95%.   PE: Constitution: Appropriate appearance for age. No apparent distress.  Sitting up at bed.   Resp: No respiratory distress. No accessory muscle usage. on RA and CTAB.  No cough during exam Cardio: Rate normal, no m/r/g appreciated. Well perfused appearance.  Trace bilateral peripheral edema--unchanged. Abdomen: soft, Nondistended. Nontender.  +BS throughout. + Core track Psych: Appropriate mood and affect. Skin: Bilateral upper extremity and ecchymosis, IV C/D/I,  + Mepilex on prior arterial line site. --Clean + Right inner thigh/calf with  scabbing from vein harvest site and lots of bruising/swelling over and just distal to site with overlying hematoma--much improved, marked area of erythema smaller + Bilateral inner ankle punctate lesions, with overlying dried blood. + Sternal incision well-approximated, healed  PRIOR EXAMS: Neurologic Exam:  Awake, alert, and oriented x 4.  Moderate memory and higher cognitive deficits.    Follows all simple commands.  No apparent impulsivity.  Language intact.  Cranial nerves II through XII intact.  Sensation intact.  No fine motor deficits.  No ataxia. Insight: Fair insight into current condition  Strength: Antigravity against resistance all 4 extremities grossly 5- out of 5, unchanged 1-10    Assessment/Plan: 1. Functional deficits which require 3+ hours per day of interdisciplinary therapy in a comprehensive inpatient rehab setting. Physiatrist is providing close team supervision and 24 hour management of active medical problems listed below. Physiatrist and rehab team continue to assess barriers to discharge/monitor patient progress toward functional and medical goals  Care Tool:  Bathing    Body parts bathed by patient: Right arm, Left arm, Chest, Abdomen, Front perineal area, Buttocks, Right upper leg, Left upper leg, Face   Body parts bathed by helper: Right lower leg, Left lower leg     Bathing  assist Assist Level: Moderate Assistance - Patient 50 - 74%     Upper Body Dressing/Undressing Upper body dressing   What is the patient wearing?: Pull over shirt    Upper body assist Assist Level: Maximal Assistance - Patient 25 - 49%    Lower Body Dressing/Undressing Lower body dressing      What is the patient wearing?: Pants, Underwear/pull up     Lower body assist Assist for lower body dressing: Maximal Assistance - Patient 25 - 49%     Toileting Toileting    Toileting assist Assist for toileting: Moderate Assistance - Patient 50 - 74%      Transfers Chair/bed transfer  Transfers assist     Chair/bed transfer assist level: Moderate Assistance - Patient 50 - 74%     Locomotion Ambulation   Ambulation assist      Assist level: 2 helpers Assistive device: No Device Max distance: 5'   Walk 10 feet activity   Assist  Walk 10 feet activity did not occur: Safety/medical concerns  Assist level:  (able to ambulate with RW, but at basline did not use an AD)     Walk 50 feet activity   Assist Walk 50 feet with 2 turns activity did not occur: Safety/medical concerns         Walk 150 feet activity   Assist Walk 150 feet activity did not occur: Safety/medical concerns         Walk 10 feet on uneven surface  activity   Assist Walk 10 feet on uneven surfaces activity did not occur: Safety/medical concerns         Wheelchair     Assist Is the patient using a wheelchair?: Yes Type of Wheelchair: Manual Wheelchair activity did not occur:  (Sternal precautions)  Wheelchair assist level: Dependent - Patient 0% Max wheelchair distance: 150'    Wheelchair 50 feet with 2 turns activity    Assist    Wheelchair 50 feet with 2 turns activity did not occur: Safety/medical concerns       Wheelchair 150 feet activity     Assist  Wheelchair 150 feet activity did not occur: Safety/medical concerns       Blood pressure 128/68, pulse 62, temperature 97.6 F (36.4 C), resp. rate 18, weight 60.4 kg, SpO2 95%.  Medical Problem List and Plan: 1. 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  Sternal precautions             -patient may  shower if cover PICC< etc             -ELOS/Goals: 14-18 days supervision to mod I  -Stable to continue CIR  2.  Antithrombotics: -DVT/anticoagulation:  Pharmaceutical: Lovenox  40mg  daily.  Venous Doppler studies negative -antiplatelet therapy: Aspirin  81 mg  daily.  Heart failure team considering adding Plavix --will need to check back on this prior to discharge  3. Pain Management: Oxycodone  as needed--no complaints 4. Mood/Behavior/Sleep: Provide emotional support             -antipsychotic agents: Seroquel  25 mg daily at 1000 and 50 mg nightly   - Continue current regimen, can add melatonin 5 mg as needed  5. Neuropsych/cognition: This patient is capable of making decisions on his own behalf.  -Cognitive deficits with memory/processing: Working with SLP  6. Skin/Wound Care: Routine skin checks. -His right lower extremity harvest site completing course of Ancef  for cellulitis --1/8: transitioned to Augmentin  1-8 to  complete 10-day course--appearance is improving   7. Fluids/Electrolytes/Nutrition/hyponatremia: Routine in and outs with follow-up chemistries, continue supplements.  -1-8: Low albumin , protein; continue current supplemental tube feeds as below.  Encourage protein and p.o. intakes.  Very mild hyponatremia, will recheck on Friday. 1-10: Hyponatremia 129, slight downtrend from 130.  Repeat BMP, serum osmol, urine studies in a.m. to confirm etiology--will be somewhat complicated by use of torsemide .  Placed on 1200 cc fluid restriction; patient is remaining well below this. -03/24/23 Na up to 132, SOsm WNL, urine studies not done yet  8.  Acute blood loss anemia.  Follow-up CBC -stable 9.  COVID-positive.  Completed Paxlovid  10.  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  Seen by vascular surgery Dr Norman Serve and plan outpatient follow-up 1 month 11.  History of VT peri-CABG.  Amiodarone  200 mg BID>>daily, Lanoxin  0.125 mg daily 12.  Hyperlipidemia.  Lipitor  80mg  daily 13.  Diastolic congestive heart failure.  Torsemide  20 mg daily, Cozaar  25 mg daily, Aldactone  25 mg daily. Dapagliflozin  10mg  daily. Monitor for any signs of fluid overload.  Daily weights. -1-8: No external signs of volume  overload.  Heart failure team evaluated, appreciate recommendations and management.  Close monitoring while switching over to p.o. diuretics - lasix  40 mg daily -1/9: Significant recorded weight gain overnight, however no external signs of volume overload. I/Os -500 yesterday. Weight Likely measurement error, monitor for trend 1-10: Weight slightly down/stable.  I's and O's positive due to tube feeds, however tube feeds being discontinued today.  Monitor sodium as above. -03/24/23 wt stable, monitor Filed Weights   03/22/23 0500 03/23/23 0241 03/24/23 0436  Weight: 60.4 kg 59.9 kg 60.4 kg    14.  Sacral wound.  Wound care nurse follow-up.  Covered with foam dressing  15.  Dysphagia.  Dysphagia #2 thin liquids.  Nasogastric tube for nutritional support. Just upgraded diet- might not need Cortrak long -Continue supplemental tube feeds as above, encourage p.o. intakes, protein. -1-10: Eating 60 to 90% of meals per documentation.  Discontinue core track, tube feeds. 16. Stool regimen: colace 200mg  daily  -03/24/23 LBM yesterday, cont regimen  LOS: 4 days A FACE TO FACE EVALUATION WAS PERFORMED  [address] 03/24/2023, 10:18 AM

## 2023-03-24 NOTE — Progress Notes (Signed)
 Pt has triple lumen PICC in left arm. No ivfs/meds are ordered via IV. Recommend removal of PICC if no longer needed.

## 2023-03-24 NOTE — Plan of Care (Signed)
  Problem: Consults Goal: RH GENERAL PATIENT EDUCATION Description: See Patient Education module for education specifics. Outcome: Progressing   Problem: RH SKIN INTEGRITY Goal: RH STG SKIN FREE OF INFECTION/BREAKDOWN Description: Manage skin with min assist Outcome: Progressing   Problem: RH SAFETY Goal: RH STG ADHERE TO SAFETY PRECAUTIONS W/ASSISTANCE/DEVICE Description: STG Adhere to Safety Precautions With cues Outcome: Progressing

## 2023-03-25 DIAGNOSIS — E871 Hypo-osmolality and hyponatremia: Secondary | ICD-10-CM | POA: Diagnosis not present

## 2023-03-25 DIAGNOSIS — R5381 Other malaise: Secondary | ICD-10-CM | POA: Diagnosis not present

## 2023-03-25 NOTE — Progress Notes (Signed)
 PROGRESS NOTE   Subjective/Complaints:  Pt doing ok again, slept ok he thinks, denies pain. LBM last night. Urinating ok. Denies any other complaints or concerns today. Of note, IV team reports PICC not being used so could consider removing it if indicated.   ROS: Denies fevers, chills, N/V, abdominal pain, constipation, diarrhea, SOB, cough, chest pain, new weakness or paraesthesias.    Objective:   No results found.  No results for input(s): WBC, HGB, HCT, PLT in the last 72 hours.  Recent Labs    03/23/23 0419 03/24/23 0345  NA 129* 132*  K 4.9 5.1  CL 98 99  CO2 24 25  GLUCOSE 121* 82  BUN 30* 33*  CREATININE 0.86 1.00  CALCIUM  8.4* 8.5*    Intake/Output Summary (Last 24 hours) at 03/25/2023 1058 Last data filed at 03/25/2023 0945 Gross per 24 hour  Intake 714 ml  Output 200 ml  Net 514 ml     Pressure Injury 03/15/23 Coccyx Mid;Lower Stage 2 -  Partial thickness loss of dermis presenting as a shallow open injury with a red, pink wound bed without slough. (Active)  03/15/23 0800  Location: Coccyx  Location Orientation: Mid;Lower  Staging: Stage 2 -  Partial thickness loss of dermis presenting as a shallow open injury with a red, pink wound bed without slough.  Wound Description (Comments):   Present on Admission: Yes    Physical Exam: Vital Signs Blood pressure 127/69, pulse (!) 58, temperature 97.6 F (36.4 C), resp. rate 18, weight 60.4 kg, SpO2 100%.   PE: Constitution: Appropriate appearance for age. No apparent distress.  Sitting up in w/c.   Resp: No respiratory distress. No accessory muscle usage. on RA and CTAB.  No cough during exam Cardio: Rate normal, no m/r/g appreciated. Well perfused appearance.  Trace bilateral peripheral edema--unchanged. Abdomen: soft, Nondistended. Nontender.  +BS throughout. Psych: Appropriate mood and affect. Skin: Bilateral upper extremity and ecchymosis,  IV C/D/I,  + Mepilex on prior arterial line site. --Clean + Right inner thigh/calf with scabbing from vein harvest site and lots of bruising/swelling over and just distal to site with overlying hematoma--much improved, marked area of erythema smaller on calf area + Bilateral inner ankle punctate lesions, with overlying dried blood. + Sternal incision well-approximated, healed  PRIOR EXAMS: Neurologic Exam:  Awake, alert, and oriented x 4.  Moderate memory and higher cognitive deficits.    Follows all simple commands.  No apparent impulsivity.  Language intact.  Cranial nerves II through XII intact.  Sensation intact.  No fine motor deficits.  No ataxia. Insight: Fair insight into current condition  Strength: Antigravity against resistance all 4 extremities grossly 5- out of 5, unchanged 1-10    Assessment/Plan: 1. Functional deficits which require 3+ hours per day of interdisciplinary therapy in a comprehensive inpatient rehab setting. Physiatrist is providing close team supervision and 24 hour management of active medical problems listed below. Physiatrist and rehab team continue to assess barriers to discharge/monitor patient progress toward functional and medical goals  Care Tool:  Bathing    Body parts bathed by patient: Right arm, Left arm, Chest, Abdomen, Front perineal area, Buttocks, Right upper leg, Left  upper leg, Face   Body parts bathed by helper: Right lower leg, Left lower leg     Bathing assist Assist Level: Moderate Assistance - Patient 50 - 74%     Upper Body Dressing/Undressing Upper body dressing   What is the patient wearing?: Pull over shirt    Upper body assist Assist Level: Maximal Assistance - Patient 25 - 49%    Lower Body Dressing/Undressing Lower body dressing      What is the patient wearing?: Pants, Underwear/pull up     Lower body assist Assist for lower body dressing: Maximal Assistance - Patient 25 - 49%     Toileting Toileting     Toileting assist Assist for toileting: Moderate Assistance - Patient 50 - 74%     Transfers Chair/bed transfer  Transfers assist     Chair/bed transfer assist level: Moderate Assistance - Patient 50 - 74%     Locomotion Ambulation   Ambulation assist      Assist level: 2 helpers Assistive device: No Device Max distance: 5'   Walk 10 feet activity   Assist  Walk 10 feet activity did not occur: Safety/medical concerns  Assist level:  (able to ambulate with RW, but at basline did not use an AD)     Walk 50 feet activity   Assist Walk 50 feet with 2 turns activity did not occur: Safety/medical concerns         Walk 150 feet activity   Assist Walk 150 feet activity did not occur: Safety/medical concerns         Walk 10 feet on uneven surface  activity   Assist Walk 10 feet on uneven surfaces activity did not occur: Safety/medical concerns         Wheelchair     Assist Is the patient using a wheelchair?: Yes Type of Wheelchair: Manual Wheelchair activity did not occur:  (Sternal precautions)  Wheelchair assist level: Dependent - Patient 0% Max wheelchair distance: 150'    Wheelchair 50 feet with 2 turns activity    Assist    Wheelchair 50 feet with 2 turns activity did not occur: Safety/medical concerns       Wheelchair 150 feet activity     Assist  Wheelchair 150 feet activity did not occur: Safety/medical concerns       Blood pressure 127/69, pulse (!) 58, temperature 97.6 F (36.4 C), resp. rate 18, weight 60.4 kg, SpO2 100%.  Medical Problem List and Plan: 1. 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  Sternal precautions             -patient may  shower if cover PICC< etc             -ELOS/Goals: 14-18 days supervision to mod I  -Stable to continue CIR  2.  Antithrombotics: -DVT/anticoagulation:  Pharmaceutical:  Lovenox  40mg  daily.  Venous Doppler studies negative -antiplatelet therapy: Aspirin  81 mg daily.  Heart failure team considering adding Plavix --will need to check back on this prior to discharge  3. Pain Management: Oxycodone  as needed--no complaints 4. Mood/Behavior/Sleep: Provide emotional support             -antipsychotic agents: Seroquel  25 mg daily at 1000 and 50 mg nightly   - Continue current regimen, can add melatonin 5 mg as needed  5. Neuropsych/cognition: This patient is capable of making decisions on his own behalf.  -Cognitive deficits with memory/processing: Working with SLP  6. Skin/Wound  Care: Routine skin checks. -His right lower extremity harvest site completing course of Ancef  for cellulitis --1/8: transitioned to Augmentin  1-8 to complete 10-day course--appearance is improving   7. Fluids/Electrolytes/Nutrition/hyponatremia: Routine in and outs with follow-up chemistries, continue supplements.  -1-8: Low albumin , protein; continue current supplemental tube feeds as below.  Encourage protein and p.o. intakes.  Very mild hyponatremia, will recheck on Friday. 1-10: Hyponatremia 129, slight downtrend from 130.  Repeat BMP, serum osmol, urine studies in a.m. to confirm etiology--will be somewhat complicated by use of torsemide .  Placed on 1200 cc fluid restriction; patient is remaining well below this. -03/24/23 Na up to 132, SOsm WNL, urine studies not done yet -03/25/23 Urine Osm still not done... scheduled weekly labs starting Monday. Per IV team, PICC not being used, consider removal if indicated- defer to primary team  8.  Acute blood loss anemia.  Follow-up CBC -stable 9.  COVID-positive.  Completed Paxlovid  10.  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  Seen by vascular surgery Dr Norman Serve and plan outpatient follow-up 1 month 11.  History of VT peri-CABG.  Amiodarone  200 mg BID>>daily, Lanoxin  0.125 mg daily 12.   Hyperlipidemia.  Lipitor  80mg  daily 13.  Diastolic congestive heart failure.  Torsemide  20 mg daily, Cozaar  25 mg daily, Aldactone  25 mg daily. Dapagliflozin  10mg  daily. Monitor for any signs of fluid overload.  Daily weights. -1-8: No external signs of volume overload.  Heart failure team evaluated, appreciate recommendations and management.  Close monitoring while switching over to p.o. diuretics - lasix  40 mg daily -1/9: Significant recorded weight gain overnight, however no external signs of volume overload. I/Os -500 yesterday. Weight Likely measurement error, monitor for trend 1-10: Weight slightly down/stable.  I's and O's positive due to tube feeds, however tube feeds being discontinued today.  Monitor sodium as above. -1/11-12/25 wt stable, monitor Filed Weights   03/22/23 0500 03/23/23 0241 03/24/23 0436  Weight: 60.4 kg 59.9 kg 60.4 kg    14.  Sacral wound.  Wound care nurse follow-up.  Covered with foam dressing  15.  Dysphagia.  Dysphagia #2 thin liquids.  Nasogastric tube for nutritional support. Just upgraded diet- might not need Cortrak long -Continue supplemental tube feeds as above, encourage p.o. intakes, protein. -1-10: Eating 60 to 90% of meals per documentation.  Discontinue core track, tube feeds. 16. Stool regimen: colace 200mg  daily  -03/25/23 LBM yesterday, cont regimen  LOS: 5 days A FACE TO FACE EVALUATION WAS PERFORMED  [address] 03/25/2023, 10:58 AM

## 2023-03-25 NOTE — Plan of Care (Signed)
  Problem: RH BOWEL ELIMINATION Goal: RH STG MANAGE BOWEL WITH ASSISTANCE Description: STG Manage Bowel with toileting Assistance. Outcome: Progressing Goal: RH STG MANAGE BOWEL W/MEDICATION W/ASSISTANCE Description: STG Manage Bowel with Medication with Mod I Assistance. Outcome: Progressing   Problem: RH BLADDER ELIMINATION Goal: RH STG MANAGE BLADDER WITH ASSISTANCE Description: STG Manage Bladder With toileting Assistance Outcome: Progressing   Problem: RH SAFETY Goal: RH STG ADHERE TO SAFETY PRECAUTIONS W/ASSISTANCE/DEVICE Description: STG Adhere to Safety Precautions With cues Outcome: Progressing

## 2023-03-25 NOTE — Progress Notes (Signed)
 Physical Therapy Session Note  Patient Details  Name: Walter Vargas MRN: [id number] Date of Birth: 08-14-1945  Today's Date: 03/25/2023 PT Individual Time: 9152-9071 PT Individual Time Calculation (min): 41 min   Short Term Goals: Week 1:  PT Short Term Goal 1 (Week 1): Pt will complete bed mobility with CGA. PT Short Term Goal 2 (Week 1): Pt will completes sit to stand with minA and LRAD. PT Short Term Goal 3 (Week 1): Pt will complete bed to chair with minA and LRAD. PT Short Term Goal 4 (Week 1): Pt will ambulate x50' with minA and LRAD.  Skilled Therapeutic Interventions/Progress Updates:     Pt supine in bed upon arrival. Pt agreeable to therapy. Pt denies any pain. Pt reports ache in thighs with sit to stand practice, therapist provided rest break and positioning as needed.   Pt performed supine to sit with min A for trunk  with use of B UE over heart pillow for maintenance of sternal precautions.   Treatment session focused on transfer training:   Sit to stand with mod A from elevated hospital bed,2/2 retropulsion with use of heart pilllow, pivot with RW and min A, stand to sit with mod A for controlled descent wit use of B UE on heart pillow, max verbal cues provided for sternal precautions.   Sit to stand x10 from [facility] with heavy mod A progressing to heavy min A, max verbal cues provided for technique with emphasis on B LE positioning, forward rock x 3, nose over toes for improved anterior weight shift and momentum, pt overall continues to have retropulsion, but demos improved technique with repetition and  max verbal cues for technique.   Pt requires prolonged seated rest breaks between standing trails 2/2 fatigue and pain/ache in thighs.   Pt seated in [facility] with all needs within reach and seatbelt alarm on.       Therapy Documentation Precautions:  Precautions Precautions: Fall, Sternal, Other (comment) Precaution Booklet Issued: No Precaution Comments: cortrak, sternal  precautions Restrictions Weight Bearing Restrictions Per Provider Order: No   Therapy/Group: Individual Therapy  [facility] Doreene Orris, [place], DPT  03/25/2023, 7:36 AM

## 2023-03-25 NOTE — Progress Notes (Addendum)
 Speech Language Pathology Daily Session Note  Patient Details  Name: Walter Vargas MRN: [id number] Date of Birth: 12-10-1945  Today's Date: 03/25/2023 SLP Individual Time: 1101-1136 SLP Individual Time Calculation (min): 35 min SLP Missed Minutes: 25 min (fatigue)  Short Term Goals: Week 1: SLP Short Term Goal 1 (Week 1): Patient will orient to date and time using available external aids with max assist. SLP Short Term Goal 2 (Week 1): Patient will utilize external memory aids to recall daily information with 75% accuracy given max assist. SLP Short Term Goal 3 (Week 1): Patient will demonstrate intellectual awareness of deficits as evidenced by stating 2 cognitive and 2 physical deficits with max assist. SLP Short Term Goal 4 (Week 1): Patient will solve basic environmental problems with 75% accuracy given max assist. SLP Short Term Goal 5 (Week 1): Patient will recall and utilize swallow safety strategy of intermittent throat clear during assessment of least restrictive diet tolerance 90% of the time given mod assist. SLP Short Term Goal 6 (Week 1): Patient will participate in trials of upgraded solids with no overt s/sx of difficulty across two sessions prior to diet upgrade.  Skilled Therapeutic Interventions:   Pt seen for skilled SLP services to address cognitive goals.  Pt fatigued today and required multiple verbal cues to remain engaged. He eventually requested to end session early due to fatigue. Pt did complete a visual memory task with ~60% accuracy independently. He was able to improve his accuracy to ~75% given min cues. Attempted matching task; however, pt reported this made his cross eyed. He completed a portion of a card sorting task with min verbal cues prior to requesting to rest.   Pt left in bed with needs in reach and bed alarm set. Continue SLP PoC.   Pain Pain Assessment Pain Scale: 0-10 Pain Score: 0-No pain  Therapy/Group: Individual Therapy  [name] 03/25/2023, 11:43 AM

## 2023-03-25 NOTE — Progress Notes (Signed)
 Occupational Therapy Session Note  Patient Details  Name: Walter Vargas MRN: [id number] Date of Birth: [birth year]  Today's Date: 03/25/2023 OT Individual Time: 1015-1100 OT Individual Time Calculation (min): 45 min    Short Term Goals: Week 1:  OT Short Term Goal 1 (Week 1): Pt will don a shirt with min A OT Short Term Goal 2 (Week 1): Pt will complete LB dressing with min A OT Short Term Goal 3 (Week 1): Pt will complete toilet transfer with min A OT Short Term Goal 4 (Week 1): Pt will complete ADL routine with no more than 1 rest break to demo increased activity tolerance  Skilled Therapeutic Interventions/Progress Updates:    Pt resting in w/c upon arrival. OT intervention with focus on sit<>stand, functional amb with RW, grooming at sink, adherence to sternal precautions. Pt unable to recall earlier therapy sessions but recalled sessions from previous day. Pt required max verbal cues for adherence to sternal precautions with sit<>stand. Max verbal cues for sequencing. Sit<>stand with min A. Amb with RW to door and returned to EOB. Max multimodal cues for RW safety and walking close to RW. Pt returned to bed with mod A for sit>supine. Pt remained in bed with all needs withiun reach. Bed alarm activated.   Therapy Documentation Precautions:  Precautions Precautions: Fall, Sternal, Other (comment) Precaution Booklet Issued: No Precaution Comments: cortrak, sternal precautions Restrictions Weight Bearing Restrictions Per Provider Order: No   Pain: Pt denies pain this morning  Therapy/Group: Individual Therapy  Maritza Debby Mare 03/25/2023, 11:06 AM

## 2023-03-25 NOTE — Progress Notes (Signed)
 Physical Therapy Session Note  Patient Details  Name: Walter Vargas MRN: [id number] Date of Birth: 08-27-1945  Today's Date: 03/25/2023 PT Individual Time: 8597-8562 PT Individual Time Calculation (min): 35 min   Short Term Goals: Week 1:  PT Short Term Goal 1 (Week 1): Pt will complete bed mobility with CGA. PT Short Term Goal 2 (Week 1): Pt will completes sit to stand with minA and LRAD. PT Short Term Goal 3 (Week 1): Pt will complete bed to chair with minA and LRAD. PT Short Term Goal 4 (Week 1): Pt will ambulate x50' with minA and LRAD.  Skilled Therapeutic Interventions/Progress Updates: Pt presents semi-reclined in bed and agreeable to therapy although pleasantly confused.  Pt performed sup to sit via log roll / min A and cues to maintain sternal precautions.  Pt sat EOB w/o UE support.  Pt forgetful of precautions.  Pt attempts to scoot forward but using UES.  Bed elevated.  Pt required rocking and counting to transfer sit to stand w/ mod A and retropulsion, requiring return to sitting EOB.  Pt performed 2nd trial w/ mod A and cues for forward lean using pillow.  Pt transfers SPT w/ mod A to w/c w/o AD.  Pt performed LE there ex to best of abilities for marching 3 x 10, LAQ, but c/o R knee/LE pain.  Pt transferred sit to stand w/ mod A using pillow and then transferred to RW and stepped to bed w/ min/mod A, but attempts to sit before completing turn.  Pt performed log roll technique w/ mod A for LES.  Pt remained supine in bed w/ bed alarm on and all needs in reach.  Missed time of 10' 2/2 c/o pain.     Therapy Documentation Precautions:  Precautions Precautions: Fall, Sternal, Other (comment) Precaution Booklet Issued: No Precaution Comments: cortrak, sternal precautions Restrictions Weight Bearing Restrictions Per Provider Order: No General:   Vital Signs: Therapy Vitals Temp: 98.2 F (36.8 C) Pulse Rate: (!) 59 Resp: 18 BP: (!) 97/46 Patient Position (if appropriate):  Lying Oxygen Therapy SpO2: 96 % O2 Device: Room Air Pain:unrated pain R knee/LE. Pain Assessment Pain Scale: 0-10 Pain Score: 0-No pain    Therapy/Group: Individual Therapy  Willam Munford P Parag Dorton 03/25/2023, 2:42 PM

## 2023-03-26 DIAGNOSIS — R5381 Other malaise: Secondary | ICD-10-CM | POA: Diagnosis not present

## 2023-03-26 LAB — BASIC METABOLIC PANEL
Anion gap: 8 (ref 5–15)
BUN: 30 mg/dL — ABNORMAL HIGH (ref 8–23)
CO2: 27 mmol/L (ref 22–32)
Calcium: 8.4 mg/dL — ABNORMAL LOW (ref 8.9–10.3)
Chloride: 97 mmol/L — ABNORMAL LOW (ref 98–111)
Creatinine, Ser: 1.16 mg/dL (ref 0.61–1.24)
GFR, Estimated: >60 mL/min (ref >60)
Glucose, Bld: 76 mg/dL (ref 70–99)
Potassium: 4.5 mmol/L (ref 3.5–5.1)
Sodium: 132 mmol/L — ABNORMAL LOW (ref 135–145)

## 2023-03-26 LAB — CBC
HCT: 33.4 % — ABNORMAL LOW (ref 39.0–52.0)
Hemoglobin: 10.4 g/dL — ABNORMAL LOW (ref 13.0–17.0)
MCH: 28.4 pg (ref 26.0–34.0)
MCHC: 31.1 g/dL (ref 30.0–36.0)
MCV: 91.3 fL (ref 80.0–100.0)
Platelets: 498 10*3/uL — ABNORMAL HIGH (ref 150–400)
RBC: 3.66 MIL/uL — ABNORMAL LOW (ref 4.22–5.81)
RDW: 18 % — ABNORMAL HIGH (ref 11.5–15.5)
WBC: 6.8 10*3/uL (ref 4.0–10.5)
nRBC: 0 % (ref 0.0–0.2)

## 2023-03-26 LAB — MAGNESIUM: Magnesium: 2.1 mg/dL (ref 1.7–2.4)

## 2023-03-26 NOTE — Progress Notes (Signed)
 Patient ID: Walter Vargas, male   DOB: [birth year], 78 y.o.   MRN: [id number]  1133- SW left message for pt son Jayson to follow-up about discharge plan. SW waiting on follow-up.   [name], MSW, LCSW Office: [phone] Cell: [phone] Fax: [phone]

## 2023-03-26 NOTE — Progress Notes (Signed)
 Occupational Therapy Session Note  Patient Details  Name: Walter Vargas MRN: [id number] Date of Birth: [birth year]  Session 1 Today's Date: 03/26/2023 OT Individual Time: 1003-1100 OT Individual Time Calculation (min): 57 min    Session 2 Today's Date: 03/26/2023 OT Individual Time: 8695-8579 OT Individual Time Calculation (min): 76 min    Short Term Goals: Week 1:  OT Short Term Goal 1 (Week 1): Pt will don a shirt with min A OT Short Term Goal 2 (Week 1): Pt will complete LB dressing with min A OT Short Term Goal 3 (Week 1): Pt will complete toilet transfer with min A OT Short Term Goal 4 (Week 1): Pt will complete ADL routine with no more than 1 rest break to demo increased activity tolerance  Skilled Therapeutic Interventions/Progress Updates:    Session 1 Pt received in the w/c with no c/o pain. He was agreeable to OT session. He was slightly more confused this session and asking OT to complete several tasks around the room (I.e. hang up his jacket that was already hanging, throw away full bottles of water). He was not oriented to year, but month. He attempted to complete incentive spirometer for increased lung expansion- he was only able to pull 100 cc with poor sequencing of task. He stood from the w/c, initially with good carryover of sternal precautions, however then quickly breaking them. He required mod A to stand. Once standing he required only min A to ambulate into the bathroom. He sat with min A to control landing. Mod A for toileting tasks. (+) urine void. Changed out saturated sacral foam pad. He required heavy max A to stand from the toilet. Pt was taken via w/c to the therapy gym for time management. He requested to weigh himself. Very difficult for pt to stand from w/c- max A. He required mod cueing for sequencing transfer into the standing scale. Weight 124.7. He then transferred to the EOM with the RW, min A. He required an extended rest break seated, vitals assessed, HR  66 bpm, SpO2 96%. Pt completed blocked practice sit <> stand, 2x4 repetitions with no UE support, to challenge generalized strengthening for ADL transfers, as well as increasing functional activity tolerance and cardiorespiratory endurance. Pt required min A with mat highly elevated. He required mod skilled cueing for knee and hip flexion- frequently sitting with little control and BLE extending. He completed 150 ft of functional mobility back to his room with the RW, requiring min A overall. Pt was left sitting up in the w/c with all needs met, chair alarm set, and call bell within reach.    Session 2 Pt received sitting in the w/c with no c/o pain, requesting to wash hands and brush teeth at the sink. His w/c was turned and he completed these tasks in sitting. He required min cueing to motor plan getting soap from automatic dispenser, poor awareness of deficits. He stood with good carryover of sternal precautions, but quickly breaking them to stand with mod A. He completed 150 ft of functional mobility with the RW at min A level ,skilled cueing for upright posture and pacing. Patient required increased time for initiation, cuing, rest breaks, and for completion of tasks throughout session. Utilized therapeutic use of self throughout to promote efficiency. Pt completed standing level reciprocal tapping activity with B UE support using a 5 in step 3x25 repetitions. Activity performed to challenge dynamic standing balance and functional activity tolerance to simulate household threshold management and reduce fall risk.  Pt required mod cueing for establishing pattern and then min- CGA overall. Skilled monitoring of vitals throughout session to ensure hemodynamic and cardiorespiratory stability. He then sat EOM and completed 3x10 knee extensions to challenge quad strength needed for carryover to ADL transfers without UE support. 5 # ankle weights used to challenge resistance. He then attempted more sit <> stands  with focus on forward weight shift and eccentric control. Increased difficulty this session, requiring mod cueing and min-mod A from elevated mat. He returned to his room via w/c. He completed toileting tasks, (+) urine void, as well as incontinent urine with mod A for clothing management. Pt was left sitting up in the w/c with all needs met, chair alarm set, and call bell within reach.    Therapy Documentation Precautions:  Precautions Precautions: Fall, Sternal, Other (comment) Precaution Booklet Issued: No Precaution Comments: cortrak, sternal precautions Restrictions Weight Bearing Restrictions Per Provider Order: Yes RUE Weight Bearing Per Provider Order: Non weight bearing (Sternal Precautions) LUE Weight Bearing Per Provider Order: Non weight bearing (Sternal Precautions)   Therapy/Group: Individual Therapy  [name] 03/26/2023, 6:36 AM

## 2023-03-26 NOTE — Progress Notes (Signed)
 PROGRESS NOTE   Subjective/Complaints:   No acute complaints.  No events overnight. Heart rate mildly bradycardic, otherwise vital stable. Labs today significant for stable hyponatremia 132, improving BUN with uptrending creatinine 1.16, and improving thrombocytosis.  Therapies note that he has poor carryover and is fully oriented, but with poor attention and will often forget where he is or what he is doing in the middle of a task.  Patient states this is new, denies any prior issues with memory or cognition.  Thinks he is sleeping well.  ROS: Denies fevers, chills, N/V, abdominal pain, constipation, diarrhea, SOB, cough, chest pain, new weakness or paraesthesias.    Objective:   No results found.  Recent Labs    03/26/23 0330  WBC 6.8  HGB 10.4*  HCT 33.4*  PLT 498*    Recent Labs    03/24/23 0345 03/26/23 0330  NA 132* 132*  K 5.1 4.5  CL 99 97*  CO2 25 27  GLUCOSE 82 76  BUN 33* 30*  CREATININE 1.00 1.16  CALCIUM  8.5* 8.4*    Intake/Output Summary (Last 24 hours) at 03/26/2023 0825 Last data filed at 03/25/2023 2215 Gross per 24 hour  Intake 800 ml  Output 350 ml  Net 450 ml     Pressure Injury 03/15/23 Coccyx Mid;Lower Stage 2 -  Partial thickness loss of dermis presenting as a shallow open injury with a red, pink wound bed without slough. (Active)  03/15/23 0800  Location: Coccyx  Location Orientation: Mid;Lower  Staging: Stage 2 -  Partial thickness loss of dermis presenting as a shallow open injury with a red, pink wound bed without slough.  Wound Description (Comments):   Present on Admission: Yes    Physical Exam: Vital Signs Blood pressure 120/61, pulse (!) 56, temperature (!) 97.4 F (36.3 C), resp. rate 17, weight 57.7 kg, SpO2 99%.   PE: Constitution: Appropriate appearance for age. No apparent distress.  Sitting upright in wheelchair in therapy gym. Resp: No respiratory distress. No  accessory muscle usage. on RA and CTAB.  No cough during exam Cardio: Regular rate and rhythm, no m/r/g appreciated. Well perfused appearance.  No bilateral peripheral edema.  Abdomen: soft, Nondistended. Nontender.  +BS throughout. Psych: Appropriate mood and affect. Skin: Bilateral upper extremity and ecchymosis, IV C/D/I, picc CDI + Right inner thigh/calf with scabbing from vein harvest site and lots of bruising/swelling over and just distal to site with overlying hematoma--continues to improve 1-13 + Bilateral inner ankle punctate lesions, with overlying dried blood-resolving 1-13. + Sternal incision well-approximated, healed   Neurologic Exam:  Awake, alert, and oriented x 4.  Moderate memory and higher cognitive deficits.  --Unchanged  Follows all simple commands.  No apparent impulsivity.  Language intact.  Cranial nerves II through XII intact.  Sensation intact.  No fine motor deficits.  No ataxia. Insight: Fair insight into current condition  Strength: Antigravity against resistance all 4 extremities grossly 5- out of 5, unchanged 1-13    Assessment/Plan: 1. Functional deficits which require 3+ hours per day of interdisciplinary therapy in a comprehensive inpatient rehab setting. Physiatrist is providing close team supervision and 24 hour management of active medical problems  listed below. Physiatrist and rehab team continue to assess barriers to discharge/monitor patient progress toward functional and medical goals  Care Tool:  Bathing    Body parts bathed by patient: Right arm, Left arm, Chest, Abdomen, Front perineal area, Buttocks, Right upper leg, Left upper leg, Face   Body parts bathed by helper: Right lower leg, Left lower leg     Bathing assist Assist Level: Moderate Assistance - Patient 50 - 74%     Upper Body Dressing/Undressing Upper body dressing   What is the patient wearing?: Pull over shirt    Upper body assist Assist Level: Maximal Assistance - Patient  25 - 49%    Lower Body Dressing/Undressing Lower body dressing      What is the patient wearing?: Pants, Underwear/pull up     Lower body assist Assist for lower body dressing: Maximal Assistance - Patient 25 - 49%     Toileting Toileting    Toileting assist Assist for toileting: Moderate Assistance - Patient 50 - 74%     Transfers Chair/bed transfer  Transfers assist     Chair/bed transfer assist level: Moderate Assistance - Patient 50 - 74%     Locomotion Ambulation   Ambulation assist      Assist level: 2 helpers Assistive device: No Device Max distance: 5'   Walk 10 feet activity   Assist  Walk 10 feet activity did not occur: Safety/medical concerns  Assist level:  (able to ambulate with RW, but at basline did not use an AD)     Walk 50 feet activity   Assist Walk 50 feet with 2 turns activity did not occur: Safety/medical concerns         Walk 150 feet activity   Assist Walk 150 feet activity did not occur: Safety/medical concerns         Walk 10 feet on uneven surface  activity   Assist Walk 10 feet on uneven surfaces activity did not occur: Safety/medical concerns         Wheelchair     Assist Is the patient using a wheelchair?: Yes Type of Wheelchair: Manual Wheelchair activity did not occur:  (Sternal precautions)  Wheelchair assist level: Dependent - Patient 0% Max wheelchair distance: 150'    Wheelchair 50 feet with 2 turns activity    Assist    Wheelchair 50 feet with 2 turns activity did not occur: Safety/medical concerns       Wheelchair 150 feet activity     Assist  Wheelchair 150 feet activity did not occur: Safety/medical concerns       Blood pressure 120/61, pulse (!) 56, temperature (!) 97.4 F (36.3 C), resp. rate 17, weight 57.7 kg, SpO2 99%.  Medical Problem List and Plan: 1. 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  Sternal precautions             -patient may  shower if cover PICC< etc             -ELOS/Goals: 14-18 days supervision to mod I  -Stable to continue CIR  2.  Antithrombotics: -DVT/anticoagulation:  Pharmaceutical: Lovenox  40mg  daily.  Venous Doppler studies negative -antiplatelet therapy: Aspirin  81 mg daily.  Heart failure team considering adding Plavix --will need to check back on this prior to discharge  3. Pain Management: Oxycodone  as needed--no complaints  4. Mood/Behavior/Sleep: Provide emotional support             -antipsychotic agents:  Seroquel  25 mg daily at 1000 and 50 mg nightly   - Continue current regimen, can add melatonin 5 mg as needed  -1-13: Will start to wean off antipsychotics; DC AM Seroquel  for tomorrow  5. Neuropsych/cognition: This patient is capable of making decisions on his own behalf.  -Cognitive deficits with memory/processing: Working with SLP  6. Skin/Wound Care: Routine skin checks. -His right lower extremity harvest site completing course of Ancef  for cellulitis --1/8: transitioned to Augmentin  1-8 to complete 10-day course--appearance is improving 1-13: DC PICC line   7. Fluids/Electrolytes/Nutrition/hyponatremia: Routine in and outs with follow-up chemistries, continue supplements.  -1-8: Low albumin , protein; continue current supplemental tube feeds as below.  Encourage protein and p.o. intakes.  Very mild hyponatremia, will recheck on Friday. 1-10: Hyponatremia 129, slight downtrend from 130.  Repeat BMP, serum osmol, urine studies in a.m. to confirm etiology--will be somewhat complicated by use of torsemide .  Placed on 1200 cc fluid restriction; patient is remaining well below this. -03/24/23 Na up to 132, SOsm WNL, urine studies not done yet -03/25/23 Urine Osm still not done... scheduled weekly labs starting Monday - 1/13: Na improved/stable - repeat Wednesday--add magnesium  to today's labs--2.1,  stable  8.  Acute blood loss anemia.  Follow-up CBC -stable   - HgB improving  9.  COVID-positive.  Completed Paxlovid  10.  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  Seen by vascular surgery Dr Norman Serve and plan outpatient follow-up 1 month 11.  History of VT peri-CABG.  Amiodarone  200 mg BID>>daily, Lanoxin  0.125 mg daily  -Remains with low/stable heart rate 12.  Hyperlipidemia.  Lipitor  80mg  daily 13.  Diastolic congestive heart failure.  Torsemide  20 mg daily, Cozaar  25 mg daily, Aldactone  25 mg daily. Dapagliflozin  10mg  daily. Monitor for any signs of fluid overload.  Daily weights. -1-8: No external signs of volume overload.  Heart failure team evaluated, appreciate recommendations and management.  Close monitoring while switching over to p.o. diuretics - lasix  40 mg daily -1/9: Significant recorded weight gain overnight, however no external signs of volume overload. I/Os -500 yesterday. Weight Likely measurement error, monitor for trend 1-10: Weight slightly down/stable.  I's and O's positive due to tube feeds, however tube feeds being discontinued today.  Monitor sodium as above. -1/11-12/25 wt stable, monitor -13: Weights down to 57.7.  KG, I's and O's positive, likely inaccurate weight.  Euvolemic appearance.  No changes at this time. Filed Weights   03/23/23 0241 03/24/23 0436 03/26/23 0600  Weight: 59.9 kg 60.4 kg 57.7 kg    14.  Sacral wound.  Wound care nurse follow-up.  Covered with foam dressing  15.  Dysphagia.  Dysphagia #2 thin liquids.  Nasogastric tube for nutritional support. Just upgraded diet- might not need Cortrak long -Continue supplemental tube feeds as above, encourage p.o. intakes, protein. -1-10: Eating 60 to 90% of meals per documentation.  Discontinue core track, tube feeds.--Continuing to eat well  16. Stool regimen: colace 200mg  daily  -03/25/23 LBM yesterday, cont regimen  17. Thrombocytosis.  Improving  LOS: 6 days A FACE TO FACE EVALUATION WAS PERFORMED  Walter Vargas Likes 03/26/2023, 8:25 AM

## 2023-03-26 NOTE — Progress Notes (Signed)
 CVAD removed per protocol per MD order. Manual pressure applied for 3 mins. Vaseline gauze, gauze, and Tegaderm applied over insertion site. No bleeding or swelling noted. Instructed patient to remain in bed for thirty mins. Educated patient about S/S of infection and when to call MD; no heavy lifting or pressure on R side for 24 hours; keep dressing dry and intact for 24 hours. Pt verbalized comprehension.

## 2023-03-26 NOTE — Progress Notes (Signed)
 Physical Therapy Session Note  Patient Details  Name: Walter Vargas MRN: [id number] Date of Birth: April 19, 1945  Today's Date: 03/26/2023 PT Individual Time: 0802-0858 PT Individual Time Calculation (min): 56 min   Short Term Goals: Week 1:  PT Short Term Goal 1 (Week 1): Pt will complete bed mobility with CGA. PT Short Term Goal 2 (Week 1): Pt will completes sit to stand with minA and LRAD. PT Short Term Goal 3 (Week 1): Pt will complete bed to chair with minA and LRAD. PT Short Term Goal 4 (Week 1): Pt will ambulate x50' with minA and LRAD.  Skilled Therapeutic Interventions/Progress Updates:     Pt received seated in [facility] and agrees to therapy. Reports pain in Rt knee. Chronic in nature. PT provides rest breaks, repositioning, and mobility to manage pain. WC transport to gym for time management. Pt performs stand step to Nustep with modA and cues for body mechanics. Pt has difficulty with motor planning of transition, becoming somewhat confused mid transfer and requiring additional cueing for safety. Pt completes Nustep for endurance training and reciprocal coordination. Pt completes x10:00 at workload of 4 with average steps per minute ~30. PT provides cues for hand and foot placement and completing full available ROM. Pt transfers back to [facility] with improved sequencing and motor planning. Pt ambulates x140' with RW and minA, with cues to decrease WB through RW for energy conservation. Pt again has difficulty motor planning transition back to WC, sitting on armrest of WC and requiring assistance for safety. Pt performs x3 reps of sit to stand with arms across chest and modA, with cues for positioning and anterior weight shifting. Pt has significant posterior bias and requires consistent cueing to correct. WC transport back to room. Left seated with alarm intact and all needs within reach.   Therapy Documentation Precautions:  Precautions Precautions: Fall, Sternal, Other (comment) Precaution  Booklet Issued: No Precaution Comments: cortrak, sternal precautions Restrictions Weight Bearing Restrictions Per Provider Order: Yes RUE Weight Bearing Per Provider Order: Non weight bearing (Sternal Precautions) LUE Weight Bearing Per Provider Order: Non weight bearing (Sternal Precautions)   Therapy/Group: Individual Therapy  Elsie JAYSON Dawn, PT, DPT 03/26/2023, 4:01 PM

## 2023-03-27 DIAGNOSIS — R5381 Other malaise: Secondary | ICD-10-CM | POA: Diagnosis not present

## 2023-03-27 MED ORDER — DONEPEZIL HCL 10 MG PO TABS
5.0000 mg | ORAL_TABLET | Freq: Every day | ORAL | Status: DC
  Administered 2023-03-27 – 2023-03-29 (×3): 5 mg via ORAL
  Filled 2023-03-27 (×3): qty 1

## 2023-03-27 MED ORDER — QUETIAPINE FUMARATE 25 MG PO TABS
25.0000 mg | ORAL_TABLET | Freq: Every day | ORAL | Status: DC
  Administered 2023-03-27: 25 mg via ORAL
  Filled 2023-03-27: qty 1

## 2023-03-27 NOTE — Progress Notes (Signed)
 Physical Therapy Session Note  Patient Details  Name: Walter Vargas MRN: [id number] Date of Birth: June 12, 1945  Today's Date: 03/27/2023 PT Individual Time: 9197-9154 PT Individual Time Calculation (min): 43 min   Today's Date: 03/27/2023 PT Individual Time: 1345-1428 PT Individual Time Calculation (min): 43 min   Short Term Goals: Week 1:  PT Short Term Goal 1 (Week 1): Pt will complete bed mobility with CGA. PT Short Term Goal 2 (Week 1): Pt will completes sit to stand with minA and LRAD. PT Short Term Goal 3 (Week 1): Pt will complete bed to chair with minA and LRAD. PT Short Term Goal 4 (Week 1): Pt will ambulate x50' with minA and LRAD.  Skilled Therapeutic Interventions/Progress Updates:     1st Session: Pt received supine in bed and agrees to therapy. No complaint of pain but pt is concerned with dried blood on linens. Drainage appear to be from incision in leg from veinous graft. RN alerted and she arrives to place dressing on incision. Pt verbalizes need to have bowel movement. Pt ambulates to toilet and sits with minA/modA. Pt unable to void at this time. Sit to stand with modA and arms crossed over chest. Pt ambulates x300' to gym with RW and CGA, with cues for upright gaze to improve posture and balance, and increasing stride length to decrease risk for falls. Following seated rest break, pt performs block training of sit to stand due to pt's tendency for posterior bias and frequent inability to stand up without at least modA. PT provides cues for hand placement, body mechanics, and sequencing, and pt is able to complete x10 reps of sit to stand, with 75% of reps performed without physical assistance, with pt utilizing upper extremity support on mat to promote optimal sequencing. Pt ambulates x200' back to room with same assistance and cues. Left seated with alarm intact and all needs within reach.   2nd Session: Pt received seated in [facility] and agrees to therapy. No complaint of pain. WC  transport to gym for time management. Pt performs stand step transfer to mat table without AD, with modA to facilitate anterior weight shift and sequencing. Following rest break, pt ambulates 2x175' with RW and CGA, with cues for upright gaze to improve posture and balance, and increasing stride length to decrease risk for falls. Seated rest break between bouts.   Pt performs sit to stand and then tasked with completing alternating toe taps on 3 step while counting down from 20 by 1s. Performed to challenge cognition and multitasking, as well as balance. Pt able to complete with CGA/minA, but is inaccurate and inconsistent with counting down. Pt then performs same activity, but counts up to 20, and is able to complete both physical and cognitive task accurately. WC transport back to room. Pt left seated with alarm intact and all needs within reach.   Therapy Documentation Precautions:  Precautions Precautions: Fall, Sternal, Other (comment) Precaution Booklet Issued: No Precaution Comments: cortrak, sternal precautions Restrictions Weight Bearing Restrictions Per Provider Order: Yes RUE Weight Bearing Per Provider Order: Non weight bearing (Sternal Precautions) LUE Weight Bearing Per Provider Order: Non weight bearing (Sternal Precautions)    Therapy/Group: Individual Therapy  Elsie JAYSON Dawn, PT, DPT 03/27/2023, 4:06 PM

## 2023-03-27 NOTE — Progress Notes (Signed)
 Speech Language Pathology Daily Session Note  Patient Details  Name: Walter Vargas MRN: [id number] Date of Birth: Apr 12, 1945  Today's Date: 03/27/2023 SLP Individual Time: 1106-1200 SLP Individual Time Calculation (min): 54 min  Short Term Goals: Week 1: SLP Short Term Goal 1 (Week 1): Patient will orient to date and time using available external aids with max assist. SLP Short Term Goal 2 (Week 1): Patient will utilize external memory aids to recall daily information with 75% accuracy given max assist. SLP Short Term Goal 3 (Week 1): Patient will demonstrate intellectual awareness of deficits as evidenced by stating 2 cognitive and 2 physical deficits with max assist. SLP Short Term Goal 4 (Week 1): Patient will solve basic environmental problems with 75% accuracy given max assist. SLP Short Term Goal 5 (Week 1): Patient will recall and utilize swallow safety strategy of intermittent throat clear during assessment of least restrictive diet tolerance 90% of the time given mod assist. SLP Short Term Goal 6 (Week 1): Patient will participate in trials of upgraded solids with no overt s/sx of difficulty across two sessions prior to diet upgrade.  Skilled Therapeutic Interventions: SLP conducted skilled therapy session targeting cognitive retraining goals. Patient targeted basic functional problem solving and memory of previous recent events via interpretation of schedule and writing in memory book. Originally, SLP attempted to have patient write in memory book but despite total verbal cues, patient unable to follow 1-step directions to accurately write date in memory book entry. SLP wrote entry for patient, with patient requiring max assist to interpret daily schedule, recall events from therapy sessions, and max-total assist to utilize memory notebook to answer questions about events after entry was completed. Patient required continuous reinforcement of date throughout session. SLP then facilitated  calendar interpretation task with patient benefiting from mod assist to answer basic problem solving questions using visual aid. Patient was left in lowered bed with call bell in reach and bed alarm set. SLP will continue to target goals per plan of care.      Pain Pain Assessment Pain Scale: 0-10 Pain Score: 0-No pain  Therapy/Group: Individual Therapy  Juris Gosnell, M.A., CCC-SLP  Dvon Jiles A Vincenzina Jagoda 03/27/2023, 12:01 PM

## 2023-03-27 NOTE — Progress Notes (Signed)
      [address]       [place] [address]             [phone]      Subjective:  Patient looks great.SABRA Flood has been removed  Objective: Vital signs in last 24 hours: Temp:  [97.5 F (36.4 C)-98.5 F (36.9 C)] 97.6 F (36.4 C) (01/14 0528) Pulse Rate:  [54-68] 54 (01/14 0528) Resp:  [17-18] 17 (01/14 0528) BP: (113-122)/(48-64) 122/64 (01/14 0528) SpO2:  [95 %-100 %] 99 % (01/14 0528) Weight:  [55.9 kg-56 kg] 55.9 kg (01/14 0600)  Intake/Output from previous day: 01/13 0701 - 01/14 0700 In: 600 [P.O.:600] Out: -  Intake/Output this shift: Total I/O In: 118 [P.O.:118] Out: -   General appearance: alert, cooperative, and no distress Heart: regular rate and rhythm Lungs: clear to auscultation bilaterally Wound: clean and dry, RLE cellulitis continues to improve   Lab Results: Recent Labs    03/26/23 0330  WBC 6.8  HGB 10.4*  HCT 33.4*  PLT 498*   BMET:  Recent Labs    03/26/23 0330  NA 132*  K 4.5  CL 97*  CO2 27  GLUCOSE 76  BUN 30*  CREATININE 1.16  CALCIUM  8.4*    PT/INR: No results for input(s): LABPROT, INR in the last 72 hours. ABG    Component Value Date/Time   PHART 7.498 (H) 03/12/2023 1310   HCO3 24.4 03/12/2023 1310   TCO2 25 03/12/2023 1310   ACIDBASEDEF 1.0 03/02/2023 1552   O2SAT 62.6 03/20/2023 0330   CBG (last 3)  No results for input(s): GLUCAP in the last 72 hours.  Assessment/Plan:  Mr. Brogden appears to be doing well.   Cortrak has been removed.  Incisions are healing well... Continue PT/OT   LOS: 7 days    Rocky Shad, PA-C 03/27/2023

## 2023-03-27 NOTE — Progress Notes (Addendum)
 Patient ID: Walter Vargas, male   DOB: [birth year], 78 y.o.   MRN: [id number]  1230- SW left message for pt son Jayson to provide updates from team conference, d/c date 1/25, and 24/7 care required due to cognitive deficits (poor safety awareness.carryover).She stress the importance of follow-up to discuss plan of care and if another level of care is appropriate. SW waiting on follow-up.  [name], MSW, LCSW Office: [phone] Cell: [phone] Fax: [phone]

## 2023-03-27 NOTE — Progress Notes (Signed)
 Occupational Therapy Session Note  Patient Details  Name: MESIAH MANZO MRN: [id number] Date of Birth: 06/02/1945  Today's Date: 03/27/2023 OT Individual Time: 0902-1000 OT Individual Time Calculation (min): 58 min    Short Term Goals: Week 1:  OT Short Term Goal 1 (Week 1): Pt will don a shirt with min A OT Short Term Goal 2 (Week 1): Pt will complete LB dressing with min A OT Short Term Goal 3 (Week 1): Pt will complete toilet transfer with min A OT Short Term Goal 4 (Week 1): Pt will complete ADL routine with no more than 1 rest break to demo increased activity tolerance  Skilled Therapeutic Interventions/Progress Updates:    Pt received sitting in the wc with no c/o pain, agreeable to session. He requested to use the bathroom. He stood with mod cueing for recall of hand placement and min A. He required min A for clothing management to use the urinal in standing. He was unable to stand statically for more than 30 seconds 2/2 fatigue and required 2x more sit <> stand to attempt and void urine, ultimately unsuccessful. He completed LB dressing with mod A, unable to thread over either LE. He completed oral care in standing at the sink with CGA for balance, poor motor planning for setting up toothbrush with toothpaste. He then completed 100 ft of functional mobility to the therapy gym with the RW, min A overall. He required an extended rest break following. He completed a standing level activity with no UE support to challenge static standing balance for carryover to toileting and LB dressing tasks. He required consistent min A during standing with posterior sway. He need to return to his room for toileting, completing another 100 ft of functional mobility with min A with the RW. Seated level urine void with min A for clothing management. He returned to the therapy gym with 100 ft of functional mobility with min A using the RW. He completed cognition retraining task with IADL focus on meal planning-  he was able to sequence matching ingredients to a recipe with mod cueing. He returned to his room via w/c. Pt was left sitting up in the w/c with all needs met, chair alarm set, and call bell within reach.   Therapy Documentation Precautions:  Precautions Precautions: Fall, Sternal, Other (comment) Precaution Booklet Issued: No Precaution Comments: cortrak, sternal precautions Restrictions Weight Bearing Restrictions Per Provider Order: Yes RUE Weight Bearing Per Provider Order: Non weight bearing (Sternal Precautions) LUE Weight Bearing Per Provider Order: Non weight bearing (Sternal Precautions)   Therapy/Group: Individual Therapy  [name] 03/27/2023, 6:43 AM

## 2023-03-27 NOTE — Patient Care Conference (Signed)
 Inpatient RehabilitationTeam Conference and Plan of Care Update Date: 03/27/2023   Time: 1009am    Patient Name: Walter Vargas      Medical Record Number: [id number]  Date of Birth: 1945/11/01 Sex: Male         Room/Bed: 4W24C/4W24C-01 Payor Info: Payor: [employer] / Plan: DREMA DUAL COMPLETE / Product Type: *No Product type* /    Admit Date/Time:  03/20/2023  4:08 PM  Primary Diagnosis:  Debility  Hospital Problems: Principal Problem:   Debility Active Problems:   S/P CABG x 5    Expected Discharge Date: Expected Discharge Date: 04/07/23  Team Members Present: Physician leading conference: Dr. Joesph Likes Social Worker Present: [name], LCSWA Nurse Present: Barnie Ronde, RN;Karen Hilliard [place], RN PT Present: Kirt Dawn, PT OT Present: Nena Moats, OT SLP Present: Rosina Downy, SLP PPS Coordinator present : Burnard Mealing, OT     Current Status/Progress Goal Weekly Team Focus  Bowel/Bladder      Continent  B/B          Swallow/Nutrition/ Hydration   Dys3/thin liquid diet   minA for use of strategies  continued use of strategies, upgrade as appropriate, continued tolerance    ADL's   (S) UB ADLs, mod A LB, min A transfers from raised surface, as much as mod-max from lower surface. barriers to d/c include cognitive deficits and heavy assist from low surfaces   Supervision ADLs and transfers   ADLs, transfers, sternal precaution, functional activity tolerance, cognition    Mobility   minA bed mobility, modA sit to stand transfers, minA gait x300' with RW   Supervision, made need to downgrade  family ed, cognition, endurance, ambulation, transfers    Communication                Safety/Cognition/ Behavioral Observations  max assist overall for basic tasks   mod assist   attention, carryover, memory notebook use and consistency, orientation and use of external aids    Pain      No pain           Skin      N/a           Discharge Planning:  pt needs to discharge to home Mod I as there is limited support at discharge. SW unsure if family support at discharge. SW will confirm there are no barriers to discharge.   Team Discussion: Patient is admitted post cardiogenic shock with poor carry over, decrease awareness confusion and dysphasia. Limited by cognition questionable dementia . MD adjusted meds.   Patient on target to meet rehab goals: no, currently needs mod A for lower body care and max assist for maintaining basic attention.  *See Care Plan and progress notes for long and short-term goals.   Revisions to Treatment Plan:  Memory book  Teaching Needs: Safety, Medications, transfers, toileting, dietary modifications, etc  Current Barriers to Discharge: Decreased caregiver support and Behavior  Possible Resolutions to Barriers: Family meeting Family education DME; [facility], rollator HH follow up services     Medical Summary Current Status: Medically complicated by cognition/memory, mood/behavior, heart failure status post CABG, multiple wounds, cellulitis, nutritional deficits, and hyponatremia  Barriers to Discharge: Behavior/Mood;Cardiac Complications;Complicated Wound;Electrolyte abnormality;Inadequate Nutritional Intake;Medical stability;Self-care education;Infection/IV Antibiotics   Possible Resolutions to [employer]: Titrate pain medications to minimal tolerable dosing, wean sedating medications to promote daytime wakefulness and concentration, daily weight and regular monitoring of volume status and labs to ensure no worsening heart failure,  treatment and monitoring of wounds and cellulitic area on right leg for improvement, encouraging p.o. intakes   Continued Need for Acute Rehabilitation Level of Care: The patient requires daily medical management by a physician with specialized training in physical medicine and rehabilitation for the following  reasons: Direction of a multidisciplinary physical rehabilitation program to maximize functional independence : Yes Medical management of patient stability for increased activity during participation in an intensive rehabilitation regime.: Yes Analysis of laboratory values and/or radiology reports with any subsequent need for medication adjustment and/or medical intervention. : Yes   I attest that I was present, lead the team conference, and concur with the assessment and plan of the team.   Walter Vargas 03/27/2023, 2:35 PM

## 2023-03-27 NOTE — Progress Notes (Signed)
 PROGRESS NOTE   Subjective/Complaints:   No acute complaints.  No events overnight.  Patient noting occasional tachycardia and feeling like his heart rate is irregular during therapies, which self resolves.  Has not reported it to his therapist.  Otherwise, no complaints.  Some ongoing minimal tenderness from right lower extremity cellulitis site.   ROS: Denies fevers, chills, N/V, abdominal pain, constipation, diarrhea, SOB, cough, chest pain, new weakness or paraesthesias.   + Right lower extremity pain/tingling over cellulitis site + Palpitations with exertion  Objective:   No results found.  Recent Labs    03/26/23 0330  WBC 6.8  HGB 10.4*  HCT 33.4*  PLT 498*    Recent Labs    03/26/23 0330  NA 132*  K 4.5  CL 97*  CO2 27  GLUCOSE 76  BUN 30*  CREATININE 1.16  CALCIUM  8.4*    Intake/Output Summary (Last 24 hours) at 03/27/2023 1009 Last data filed at 03/27/2023 0745 Gross per 24 hour  Intake 478 ml  Output --  Net 478 ml     Pressure Injury 03/15/23 Coccyx Mid;Lower Stage 2 -  Partial thickness loss of dermis presenting as a shallow open injury with a red, pink wound bed without slough. (Active)  03/15/23 0800  Location: Coccyx  Location Orientation: Mid;Lower  Staging: Stage 2 -  Partial thickness loss of dermis presenting as a shallow open injury with a red, pink wound bed without slough.  Wound Description (Comments):   Present on Admission: Yes    Physical Exam: Vital Signs Blood pressure 122/64, pulse (!) 54, temperature 97.6 F (36.4 C), resp. rate 17, weight 55.9 kg, SpO2 99%.   PE: Constitution: Appropriate appearance for age. No apparent distress.  Sitting upright at bedside. Resp: No respiratory distress. No accessory muscle usage. on RA and CTAB.   Cardio: Regular rate and rhythm. Well perfused appearance.  Right lower extremity edema 1+, mostly around prior cellulitis site.   Left lower extremity no edema.  Peripheral pulses 2+. Abdomen: soft, Nondistended. Nontender.  +BS throughout. Psych: Appropriate mood and affect. Skin: Left upper extremity Mepilex dressing, clean dressing overlying prior PICC site; right upper extremity IV CDI + Right inner thigh/calf with scabbing from vein harvest site and lots of bruising/swelling over and just distal to site with overlying hematoma--mildly more erythematous along medial calf today, staying within margins of prior marking, slight warmth, brisk capillary refill. + Bilateral inner ankle punctate lesions, with overlying dried blood-resolving 1-13. + Sternal incision well-approximated, healed   Neurologic Exam:  Awake, alert, and oriented to self and place; oriented to time with significant cues.  Moderate memory and higher cognitive deficits--ongoing.  Poor carryover.  Follows all simple commands.  No apparent impulsivity.  Language intact.  Cranial nerves II through XII intact.  Sensation intact.  No fine motor deficits.  No ataxia. Insight: Fair insight into current condition Strength: Antigravity against resistance all 4 extremities grossly 5- out of 5, unchanged 1-14    Assessment/Plan: 1. Functional deficits which require 3+ hours per day of interdisciplinary therapy in a comprehensive inpatient rehab setting. Physiatrist is providing close team supervision and 24 hour management of active medical problems  listed below. Physiatrist and rehab team continue to assess barriers to discharge/monitor patient progress toward functional and medical goals  Care Tool:  Bathing    Body parts bathed by patient: Right arm, Left arm, Chest, Abdomen, Front perineal area, Buttocks, Right upper leg, Left upper leg, Face   Body parts bathed by helper: Right lower leg, Left lower leg     Bathing assist Assist Level: Moderate Assistance - Patient 50 - 74%     Upper Body Dressing/Undressing Upper body dressing   What is the  patient wearing?: Pull over shirt    Upper body assist Assist Level: Maximal Assistance - Patient 25 - 49%    Lower Body Dressing/Undressing Lower body dressing      What is the patient wearing?: Pants, Underwear/pull up     Lower body assist Assist for lower body dressing: Maximal Assistance - Patient 25 - 49%     Toileting Toileting    Toileting assist Assist for toileting: Moderate Assistance - Patient 50 - 74%     Transfers Chair/bed transfer  Transfers assist     Chair/bed transfer assist level: Moderate Assistance - Patient 50 - 74%     Locomotion Ambulation   Ambulation assist      Assist level: 2 helpers Assistive device: No Device Max distance: 5'   Walk 10 feet activity   Assist  Walk 10 feet activity did not occur: Safety/medical concerns  Assist level:  (able to ambulate with RW, but at basline did not use an AD)     Walk 50 feet activity   Assist Walk 50 feet with 2 turns activity did not occur: Safety/medical concerns         Walk 150 feet activity   Assist Walk 150 feet activity did not occur: Safety/medical concerns         Walk 10 feet on uneven surface  activity   Assist Walk 10 feet on uneven surfaces activity did not occur: Safety/medical concerns         Wheelchair     Assist Is the patient using a wheelchair?: Yes Type of Wheelchair: Manual Wheelchair activity did not occur:  (Sternal precautions)  Wheelchair assist level: Dependent - Patient 0% Max wheelchair distance: 150'    Wheelchair 50 feet with 2 turns activity    Assist    Wheelchair 50 feet with 2 turns activity did not occur: Safety/medical concerns       Wheelchair 150 feet activity     Assist  Wheelchair 150 feet activity did not occur: Safety/medical concerns       Blood pressure 122/64, pulse (!) 54, temperature 97.6 F (36.4 C), resp. rate 17, weight 55.9 kg, SpO2 99%.  Medical Problem List and Plan: 1. 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  Sternal precautions             -patient may shower             -ELOS/Goals: 14-18 days supervision to mod I-- With supervision goals DC 1/25; will need family meeting to discuss supervision/goals  -Stable to continue CIR  1/14: Needs to be independent at discharge per SW; son has moved out of state and can maybe provide 1-2 weeks on discharge. Patient's son's GF has provided some support in the hospital. Carryover very poor; Max-total assist with sternal precautions, Min A if able to push up. Difficulty with LBD and toiletting tasks - will forget what  he is doing with toileting tasks. Per PT has been needing lots of help at home too. Per SW presenting like dementia cog-wise. Doing well with Dys 3. Will need assistance at discharge; SW to discuss with family.   2.  Antithrombotics: -DVT/anticoagulation:  Pharmaceutical: Lovenox  40mg  daily.  Venous Doppler studies negative -antiplatelet therapy: Aspirin  81 mg daily.  Heart failure team considering adding Plavix --will need to check back on this prior to discharge  3. Pain Management: Oxycodone  as needed--no complaints  4. Mood/Behavior/Sleep: Provide emotional support             - antipsychotic agents: Seroquel  25 mg daily at 1000 and 50 mg nightly   - Continue current regimen, can add melatonin 5 mg as needed  -1-13: Will start to wean off antipsychotics; DC AM Seroquel  for tomorrow  5. Neuropsych/cognition: This patient is capable of making decisions on his own behalf.  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  Weaning sedating medications as above.  Will start Aricept  5 mg nightly-theoretical risk of arrhythmia, however on literature review cardiac events matched placebo on meta-analysis.  6. Skin/Wound Care:  Routine skin checks. -His right lower extremity harvest site completing course of Ancef  for cellulitis --1/8: transitioned to Augmentin  1-8 to complete 10-day course--appearance slightly more edematous/erythematous 1-14, will get WBC in a.m. and monitor closely, still staying within margins of prior marking so may just be taking a long time to resolve  1-13: DC PICC line--site looks clean.   7. Fluids/Electrolytes/Nutrition/hyponatremia: Routine in and outs with follow-up chemistries, continue supplements.  -1-8: Low albumin , protein; continue current supplemental tube feeds as below.  Encourage protein and p.o. intakes.  Very mild hyponatremia, will recheck on Friday. 1-10: Hyponatremia 129, slight downtrend from 130.  Repeat BMP, serum osmol, urine studies in a.m. to confirm etiology--will be somewhat complicated by use of torsemide .  Placed on 1200 cc fluid restriction; patient is remaining well below this. -03/24/23 Na up to 132, SOsm WNL, urine studies not done yet -03/25/23 Urine Osm still not done... scheduled weekly labs starting Monday - 1/13: Na improved/stable - repeat Wednesday--add magnesium  to today's labs--2.1, stable  8.  Acute blood loss anemia.  Follow-up CBC -stable   - HgB improving  9.  COVID-positive.  Completed Paxlovid  10.  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  Seen by vascular surgery Dr Norman Serve and plan outpatient follow-up 1 month 11.  History of VT peri-CABG.  Amiodarone  200 mg BID>>daily, Lanoxin  0.125 mg daily  -Remains with low/stable heart rate  -1-14: Patient reporting irregular palpitations during therapies.  None documented, made PT aware so they can document any events during their therapies today.  12.  Hyperlipidemia.  Lipitor  80mg  daily 13.  Diastolic congestive heart failure.  Torsemide  20 mg daily, Cozaar  25 mg daily, Aldactone  25 mg daily. Dapagliflozin  10mg  daily. Monitor for any signs of fluid overload.   Daily weights. -1-8: No external signs of volume overload.  Heart failure team evaluated, appreciate recommendations and management.  Close monitoring while switching over to p.o. diuretics - lasix  40 mg daily -1/9: Significant recorded weight gain overnight, however no external signs of volume overload. I/Os -500 yesterday. Weight Likely measurement error, monitor for trend 1-10: Weight slightly down/stable.  I's and O's positive due to tube feeds, however tube feeds being discontinued today.  Monitor sodium as above. -1/11-12/25 wt stable, monitor -13: Weights  down to 57.7.  KG, I's and O's positive, likely inaccurate weight.  Euvolemic appearance.  No changes at this time. 1-14: Weights continue to downtrend.  Small amount of edema just over cellulitis site; continue current regimen Filed Weights   03/26/23 0600 03/27/23 0500 03/27/23 0600  Weight: 57.7 kg 56 kg 55.9 kg    14.  Sacral wound.  Wound care nurse follow-up.  Covered with foam dressing  15.  Dysphagia.  Dysphagia #2 thin liquids.  Nasogastric tube for nutritional support. Just upgraded diet- might not need Cortrak long -Continue supplemental tube feeds as above, encourage p.o. intakes, protein. -1-10: Eating 60 to 90% of meals per documentation.  Discontinue core track, tube feeds.--Continuing to eat well  16. Stool regimen: colace 200mg  daily  -03/25/23 LBM   17. Thrombocytosis. Improving  LOS: 7 days A FACE TO FACE EVALUATION WAS PERFORMED  Joesph JAYSON Likes 03/27/2023, 10:09 AM

## 2023-03-28 LAB — BASIC METABOLIC PANEL
Anion gap: 9 (ref 5–15)
BUN: 34 mg/dL — ABNORMAL HIGH (ref 8–23)
CO2: 24 mmol/L (ref 22–32)
Calcium: 8.5 mg/dL — ABNORMAL LOW (ref 8.9–10.3)
Chloride: 98 mmol/L (ref 98–111)
Creatinine, Ser: 1.02 mg/dL (ref 0.61–1.24)
GFR, Estimated: >60 mL/min (ref >60)
Glucose, Bld: 97 mg/dL (ref 70–99)
Potassium: 4.6 mmol/L (ref 3.5–5.1)
Sodium: 131 mmol/L — ABNORMAL LOW (ref 135–145)

## 2023-03-28 LAB — CBC WITH DIFFERENTIAL/PLATELET
Abs Immature Granulocytes: 0.03 10*3/uL (ref 0.00–0.07)
Basophils Absolute: 0.1 10*3/uL (ref 0.0–0.1)
Basophils Relative: 1 %
Eosinophils Absolute: 0.2 10*3/uL (ref 0.0–0.5)
Eosinophils Relative: 3 %
HCT: 36.2 % — ABNORMAL LOW (ref 39.0–52.0)
Hemoglobin: 11.5 g/dL — ABNORMAL LOW (ref 13.0–17.0)
Immature Granulocytes: 1 %
Lymphocytes Relative: 13 %
Lymphs Abs: 0.8 10*3/uL (ref 0.7–4.0)
MCH: 28.4 pg (ref 26.0–34.0)
MCHC: 31.8 g/dL (ref 30.0–36.0)
MCV: 89.4 fL (ref 80.0–100.0)
Monocytes Absolute: 0.8 10*3/uL (ref 0.1–1.0)
Monocytes Relative: 12 %
Neutro Abs: 4.6 10*3/uL (ref 1.7–7.7)
Neutrophils Relative %: 70 %
Platelets: 473 10*3/uL — ABNORMAL HIGH (ref 150–400)
RBC: 4.05 MIL/uL — ABNORMAL LOW (ref 4.22–5.81)
RDW: 17.5 % — ABNORMAL HIGH (ref 11.5–15.5)
WBC: 6.4 10*3/uL (ref 4.0–10.5)
nRBC: 0 % (ref 0.0–0.2)

## 2023-03-28 MED ORDER — CALCIUM CARBONATE ANTACID 500 MG PO CHEW: 1.0000 | CHEWABLE_TABLET | Freq: Three times a day (TID) | ORAL | Status: DC | PRN

## 2023-03-28 MED ORDER — QUETIAPINE FUMARATE 25 MG PO TABS: 25.0000 mg | ORAL_TABLET | Freq: Every evening | ORAL | Status: DC | PRN

## 2023-03-28 MED ORDER — MELATONIN 5 MG PO TABS
5.0000 mg | ORAL_TABLET | Freq: Every day | ORAL | Status: DC
  Administered 2023-03-28 – 2023-03-29 (×2): 5 mg via ORAL
  Filled 2023-03-28 (×2): qty 1

## 2023-03-28 MED ORDER — PANTOPRAZOLE SODIUM 40 MG PO TBEC
40.0000 mg | DELAYED_RELEASE_TABLET | Freq: Two times a day (BID) | ORAL | Status: DC
  Filled 2023-03-28: qty 1

## 2023-03-28 MED ORDER — CALCIUM CARBONATE 1250 (500 CA) MG PO TABS
1.0000 | ORAL_TABLET | Freq: Every day | ORAL | Status: DC
  Administered 2023-03-28 – 2023-04-02 (×5): 1250 mg via ORAL
  Filled 2023-03-28 (×6): qty 1

## 2023-03-28 NOTE — Progress Notes (Signed)
 Speech Language Pathology Weekly Progress and Session Note  Patient Details  Name: Walter Vargas MRN: [id number] Date of Birth: 21-Jul-1945  Beginning of progress report period: March 21, 2023 End of progress report period: March 28, 2023  Today's Date: 03/28/2023 SLP Individual Time: 4098-1191 SLP Individual Time Calculation (min): 32 min  Short Term Goals: Week 1: SLP Short Term Goal 1 (Week 1): Patient will orient to date and time using available external aids with max assist. SLP Short Term Goal 1 - Progress (Week 1): Met SLP Short Term Goal 2 (Week 1): Patient will utilize external memory aids to recall daily information with 75% accuracy given max assist. SLP Short Term Goal 2 - Progress (Week 1): Met SLP Short Term Goal 3 (Week 1): Patient will demonstrate intellectual awareness of deficits as evidenced by stating 2 cognitive and 2 physical deficits with max assist. SLP Short Term Goal 3 - Progress (Week 1): Not met SLP Short Term Goal 4 (Week 1): Patient will solve basic environmental problems with 75% accuracy given max assist. SLP Short Term Goal 4 - Progress (Week 1): Met SLP Short Term Goal 5 (Week 1): Patient will recall and utilize swallow safety strategy of intermittent throat clear during assessment of least restrictive diet tolerance 90% of the time given mod assist. SLP Short Term Goal 5 - Progress (Week 1): Not met SLP Short Term Goal 6 (Week 1): Patient will participate in trials of upgraded solids with no overt s/sx of difficulty across two sessions prior to diet upgrade. SLP Short Term Goal 6 - Progress (Week 1): Met  New Short Term Goals: Week 2: SLP Short Term Goal 1 (Week 2): Patient will orient to date and time using available external aids with mod assist. SLP Short Term Goal 2 (Week 2): Patient will utilize external memory aids to recall daily information with 50% accuracy given mod assist. SLP Short Term Goal 3 (Week 2): Patient will demonstrate intellectual  awareness of deficits as evidenced by stating 2 cognitive and 2 physical deficits with max assist. SLP Short Term Goal 4 (Week 2): Patient will solve basic environmental problems with 75% accuracy given mod assist. SLP Short Term Goal 5 (Week 2): Patient will recall and utilize swallow safety strategy of intermittent throat clear during assessment of least restrictive diet tolerance 90% of the time given mod assist. SLP Short Term Goal 6 (Week 2): Patient will participate in trials of upgraded solids with no overt s/sx of difficulty across two sessions prior to diet upgrade.  Weekly Progress Updates: Patient has made progress towards therapy goals, meeting 4/6 short term goals set this reporting period. Patient is currently oriented to date and day of the week utilizing external aids and min assist at latest session, though max assist on average this week. When given encouragement and max cues, patient recalls events taking place during therapy sessions with reliance upon external memory book aid for accuracy. Patient benefits from max assist to recall safe swallowing strategies but continues to tolerate Dys3/thin liquid diet with no difficulty. Patient and family education ongoing. Patient will continue to benefit from skilled therapy services during remainder of CIR stay.    Intensity: Minumum of 1-2 x/day, 30 to 90 minutes Frequency: 3 to 5 out of 7 days Duration/Length of Stay: 10-14 days Treatment/Interventions: Cognitive remediation/compensation;Environmental controls;Cueing hierarchy;Functional tasks;Therapeutic Activities;Therapeutic Exercise;Internal/external aids;Medication managment;Patient/family education;Dysphagia/aspiration precaution training   Daily Session  Skilled Therapeutic Interventions: SLP conducted skilled therapy session targeting cognitive retraining and dysphagia management goals. Upon  SLP entry, patient receiving personal care/assistance toileting from nursing staff and  patient requested SLP leave and return when personal care was complete. SLP returned at 1:15 to initiate session. During session, patient consumed Dys3 solids with no overt s/sx of penetration/aspiration and no significant oral residue across trials.  Patient refused boluses of thin liquids, stating he "wasn't in the mood"; likewise, patient states he was not "of the mind" to participate in any significant cognitive tasks, thus SLP facilitated environmental and functional tasks covertly throughout session. Patient interpreted daily schedule with overall mod assist and required max cues and encouragement to look at entries in memory log to recall events taking place in therapy sessions earlier in the day. Patient continues to report chest pain similar to that of the morning with SLP confirming this chest pain was addressed via EKG prior to SLP arrival. Patient was left in chair with call bell in reach and chair alarm set. SLP will continue to target goals per plan of care.      Pain Pain Assessment Pain Scale: 0-10 Pain Score: 8  Pain Location: Chest  Therapy/Group: Individual Therapy  Melia Hopes, M.A., CCC-SLP   Meera Vasco A Leighla Chestnutt 03/28/2023, 3:34 PM

## 2023-03-28 NOTE — Progress Notes (Signed)
 Physical Therapy Session Note  Patient Details  Name: Walter Vargas MRN: [id number] Date of Birth: [birth year]  Today's Date: 03/28/2023 PT Individual Time: 0800-0858 PT Individual Time Calculation (min): 58 min   Today's Date: 03/28/2023 PT Individual Time: 7829-5621 PT Individual Time Calculation (min): 40 min   Short Term Goals: Week 1:  PT Short Term Goal 1 (Week 1): Pt will complete bed mobility with CGA. PT Short Term Goal 2 (Week 1): Pt will completes sit to stand with minA and LRAD. PT Short Term Goal 3 (Week 1): Pt will complete bed to chair with minA and LRAD. PT Short Term Goal 4 (Week 1): Pt will ambulate x50' with minA and LRAD.  Skilled Therapeutic Interventions/Progress Updates:     1st Session: Pt received supine in bed and agrees to therapy. No copmlaint of pain. Pt states he did not sleep well night before and is very tired. PT provides rest breaks to manage fatigue. Supine to with with cues for sequencing and positioning. PT assists to don bilateral shoes prior to mobility. Stand step to [facility] with CGA and cues for sequencing and positioning. WC transport to gym. Pt ambulates 2x175' and 1x225' with extended seated rest breaks. PT emphasizes optimal sequencing of sit to stand transfer and pt is able to complete with close supervision and no physical assistance required. Pt ambulates with RW and cues for upright gaze to improve posture and balance, and increasing stride length and gait speed to decrease risk for falls. Pt left seated in WC with alarm intact and all needs within reach.   2nd Session: Pt received seated in [facility] and agrees to therapy. No complaint of pain but does verbalize significant fatigue. Pt performs sit to stand with cues for hand placement, body mechanics, and sequencing. Pt ambulates x125' with RW and CGA, with cues for upright gaze to improve posture and balance, and increasing stride length to decrease risk for falls. Pt completes Nustep for endurance  training. Pt completes x10:00 total at workload of 5 with average steps per minute ~45. PT provides cues for hand and foot placement and completing full available ROM. Pt ambulates back to room with similar cues and assistance. PT has extended discussion with pt regarding his desire to leave rehab and discharge home. PT explains that therapy staff as recommended 24/7 supervision. Pt has some difficulty understanding recommendation, but is agreeable to continuing conversation. Pt left seated with alarm intact and all needs within reach.   Therapy Documentation Precautions:  Precautions Precautions: Fall, Sternal, Other (comment) Precaution Booklet Issued: No Precaution Comments: cortrak, sternal precautions Restrictions Weight Bearing Restrictions Per Provider Order: No RUE Weight Bearing Per Provider Order: Non weight bearing (Sternal Precautions) LUE Weight Bearing Per Provider Order: Non weight bearing (Sternal Precautions)   Therapy/Group: Individual Therapy  Neva Barban, PT, DPT 03/28/2023, 4:08 PM

## 2023-03-28 NOTE — Progress Notes (Signed)
PROGRESS NOTE   Subjective/Complaints:   Patient complaining today of nausea overnight, with one episode vomitting. Thinks this has happened before but not recently. Did interrupt his sleep. Otherwise no complaints.  Vitals stable, currently in borderline bradycardia.  Therapies report no tachycardias or arrhythmias, palpitations with sessions yesterday.  Patient denied this with them. Labs today with stable hyponatremia, mildly elevated BUN with stable creatinine, thrombocytosis downtrending, hemoglobin stable.   ROS: Denies fevers, chills,   constipation, diarrhea, SOB, cough, chest pain, new weakness or paraesthesias.   + Right lower extremity pain/tingling over cellulitis site - improving + Palpitations with exertion - resolved/improvedf + Nausea/Vommitting-  new  Objective:   No results found.  Recent Labs    03/26/23 0330 03/28/23 0536  WBC 6.8 6.4  HGB 10.4* 11.5*  HCT 33.4* 36.2*  PLT 498* 473*    Recent Labs    03/26/23 0330 03/28/23 0536  NA 132* 131*  K 4.5 4.6  CL 97* 98  CO2 27 24  GLUCOSE 76 97  BUN 30* 34*  CREATININE 1.16 1.02  CALCIUM 8.4* 8.5*    Intake/Output Summary (Last 24 hours) at 03/28/2023 0756 Last data filed at 03/28/2023 0745 Gross per 24 hour  Intake 596 ml  Output 500 ml  Net 96 ml     Pressure Injury 03/15/23 Coccyx Mid;Lower Stage 2 -  Partial thickness loss of dermis presenting as a shallow open injury with a red, pink wound bed without slough. (Active)  03/15/23 0800  Location: Coccyx  Location Orientation: Mid;Lower  Staging: Stage 2 -  Partial thickness loss of dermis presenting as a shallow open injury with a red, pink wound bed without slough.  Wound Description (Comments):   Present on Admission: Yes    Physical Exam: Vital Signs Blood pressure 119/66, pulse 62, temperature (!) 97.4 F (36.3 C), resp. rate 17, weight 55.6 kg, SpO2 98%.   PE: Constitution:  Appropriate appearance for age. No apparent distress.  Sitting upright in WC.  Resp: No respiratory distress. No accessory muscle usage. on RA and CTAB.   Cardio: Regular rate and rhythm. Well perfused appearance.  Right lower extremity edema resolved around prior cellulitis site.  Peripheral pulses 2+. Abdomen: soft, Nondistended. Nontender.  +BS throughout. Psych: Appropriate mood and affect. Skin: Left upper extremity Mepilex dressing, clean dressing overlying prior PICC site; right upper extremity IV CDI  + Right inner thigh/calf with scabbing from vein harvest site- healing/improving, staying within margins of prior marking, slight warmth, brisk capillary refill. + Sternal incision well-approximated, healed   Neurologic Exam:  Awake, alert, and oriented to self and place; oriented to time with significant cues.  Moderate memory and higher cognitive deficits--ongoing.  Poor carryover.  Follows all simple commands. Cranial nerves II through XII intact.  Sensation intact.  No fine motor deficits.  No ataxia. Insight: Fair insight into current condition Strength: Antigravity against resistance all 4 extremities grossly 5- out of 5, unchanged 1-15    Assessment/Plan: 1. Functional deficits which require 3+ hours per day of interdisciplinary therapy in a comprehensive inpatient rehab setting. Physiatrist is providing close team supervision and 24 hour management of active medical problems listed below.  Physiatrist and rehab team continue to assess barriers to discharge/monitor patient progress toward functional and medical goals  Care Tool:  Bathing    Body parts bathed by patient: Right arm, Left arm, Chest, Abdomen, Front perineal area, Buttocks, Right upper leg, Left upper leg, Face   Body parts bathed by helper: Right lower leg, Left lower leg     Bathing assist Assist Level: Moderate Assistance - Patient 50 - 74%     Upper Body Dressing/Undressing Upper body dressing   What  is the patient wearing?: Pull over shirt    Upper body assist Assist Level: Maximal Assistance - Patient 25 - 49%    Lower Body Dressing/Undressing Lower body dressing      What is the patient wearing?: Pants, Underwear/pull up     Lower body assist Assist for lower body dressing: Maximal Assistance - Patient 25 - 49%     Toileting Toileting    Toileting assist Assist for toileting: Moderate Assistance - Patient 50 - 74%     Transfers Chair/bed transfer  Transfers assist     Chair/bed transfer assist level: Moderate Assistance - Patient 50 - 74%     Locomotion Ambulation   Ambulation assist      Assist level: 2 helpers Assistive device: No Device Max distance: 5'   Walk 10 feet activity   Assist  Walk 10 feet activity did not occur: Safety/medical concerns  Assist level:  (able to ambulate with RW, but at basline did not use an AD)     Walk 50 feet activity   Assist Walk 50 feet with 2 turns activity did not occur: Safety/medical concerns         Walk 150 feet activity   Assist Walk 150 feet activity did not occur: Safety/medical concerns         Walk 10 feet on uneven surface  activity   Assist Walk 10 feet on uneven surfaces activity did not occur: Safety/medical concerns         Wheelchair     Assist Is the patient using a wheelchair?: Yes Type of Wheelchair: Manual Wheelchair activity did not occur:  (Sternal precautions)  Wheelchair assist level: Dependent - Patient 0% Max wheelchair distance: 150'    Wheelchair 50 feet with 2 turns activity    Assist    Wheelchair 50 feet with 2 turns activity did not occur: Safety/medical concerns       Wheelchair 150 feet activity     Assist  Wheelchair 150 feet activity did not occur: Safety/medical concerns       Blood pressure 119/66, pulse 62, temperature (!) 97.4 F (36.3 C), resp. rate 17, weight 55.6 kg, SpO2 98%.  Medical Problem List and Plan: 1.  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  Sternal precautions             -patient may shower             -ELOS/Goals: 14-18 days supervision to mod I-- With supervision goals DC 1/25; will need family meeting to discuss supervision/goals  -Stable to continue CIR  1/14: Needs to be independent at discharge per SW; son has moved out of state and can maybe provide 1-2 weeks on discharge. Patient's son's GF has provided some support in the hospital. Carryover very poor; Max-total assist with sternal precautions, Min A if able to push up. Difficulty with LBD and toiletting tasks - will forget what he is  doing with toileting tasks. Per PT has been needing lots of help at home too. Per SW presenting like dementia cog-wise. Doing well with Dys 3. Will need assistance at discharge; SW to discuss with family.   2.  Antithrombotics: -DVT/anticoagulation:  Pharmaceutical: Lovenox 40mg  daily.  Venous Doppler studies negative -antiplatelet therapy: Aspirin 81 mg daily.  Heart failure team considering adding Plavix--will need to check back on this prior to discharge  3. Pain Management: Oxycodone as needed--no complaints  4. Mood/Behavior/Sleep: Provide emotional support             - antipsychotic agents: Seroquel 25 mg daily at 1000 and 50 mg nightly   - Continue current regimen, can add melatonin 5 mg as needed  -1-13: Will start to wean off antipsychotics; DC AM Seroquel for tomorrow  1-15: Patient tolerated well, change nightly Seroquel to as needed  5. Neuropsych/cognition: This patient is capable of making decisions on his own behalf.  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  Weaning sedating medications as above.  Will start Aricept 5 mg nightly-theoretical risk of arrhythmia, however on literature  review cardiac events matched placebo on meta-analysis.  1-15: Tolerated  overnight  6. Skin/Wound Care: Routine skin checks. -His right lower extremity harvest site completing course of Ancef for cellulitis --1/8: transitioned to Augmentin 1-8 to complete 10-day course--appearance slightly more edematous/erythematous 1-14, will get WBC in a.m. and monitor closely, still staying within margins of prior marking so may just be taking a long time to resolve  1-13: DC PICC line--site looks clean.   7. Fluids/Electrolytes/Nutrition/hyponatremia: Routine in and outs with follow-up chemistries, continue supplements.  -1-8: Low albumin, protein; continue current supplemental tube feeds as below.  Encourage protein and p.o. intakes.  Very mild hyponatremia, will recheck on Friday. 1-10: Hyponatremia 129, slight downtrend from 130.  Repeat BMP, serum osmol, urine studies in a.m. to confirm etiology--will be somewhat complicated by use of torsemide.  Placed on 1200 cc fluid restriction; patient is remaining well below this. -03/24/23 Na up to 132, SOsm WNL, urine studies not done yet -03/25/23 Urine Osm still not done... scheduled weekly labs starting Monday - 1/13: Na improved/stable - repeat Wednesday--add magnesium to today's labs--2.1, stable  1-15: NA stable 131; add calcium supplementation, PRN tums, and increase protonic to 40 mg BID for at bedtime nausea  8.  Acute blood loss anemia.  Follow-up CBC -stable   - HgB improving  9.  COVID-positive.  Completed Paxlovid 10.  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  Seen by vascular surgery Dr Carolynn Sayers and plan outpatient follow-up 1 month 11.  History of VT peri-CABG.  Amiodarone 200 mg BID>>daily, Lanoxin 0.125 mg daily  -Remains with low/stable heart rate  -1-14: Patient reporting irregular palpitations during therapies.  None documented, made PT aware so they can document any events during their therapies  today.--did not occur per therapies 1/14  12.  Hyperlipidemia.  Lipitor 80mg  daily 13.  Diastolic congestive heart failure.  Torsemide 20 mg daily, Cozaar 25 mg daily, Aldactone 25 mg daily. Dapagliflozin 10mg  daily. Monitor for any signs of fluid overload.  Daily weights. -1-8: No external signs of volume overload.  Heart failure team evaluated, appreciate recommendations and management.  Close monitoring while switching over to p.o. diuretics - lasix 40 mg daily -1/9: Significant recorded weight gain overnight, however no external signs of volume overload.  I/Os -500 yesterday. Weight Likely measurement error, monitor for trend 1-10: Weight slightly down/stable.  I's and O's positive due to tube feeds, however tube feeds being discontinued today.  Monitor sodium as above. -1/11-12/25 wt stable, monitor -13: Weights down to 57.7.  KG, I's and O's positive, likely inaccurate weight.  Euvolemic appearance.  No changes at this time. 03-26-13: Weights continue to downtrend.  Small amount of edema just over cellulitis site; continue current regimen Filed Weights   03/27/23 0500 03/27/23 0600 03/28/23 0500  Weight: 56 kg 55.9 kg 55.6 kg    14.  Sacral wound.  Wound care nurse follow-up.  Covered with foam dressing  15.  Dysphagia.  Dysphagia #2 thin liquids.  Nasogastric tube for nutritional support. Just upgraded diet- might not need Cortrak long -Continue supplemental tube feeds as above, encourage p.o. intakes, protein. -1-10: Eating 60 to 90% of meals per documentation.  Discontinue core track, tube feeds.--Continuing to eat well  16. Stool regimen: colace 200mg  daily  -03/25/23 LBM   17. Thrombocytosis. Improving  LOS: 8 days A FACE TO FACE EVALUATION WAS PERFORMED  Angelina Sheriff 03/28/2023, 7:56 AM

## 2023-03-28 NOTE — Progress Notes (Signed)
 Occupational Therapy Weekly Progress Note  Patient Details  Name: Walter Vargas MRN: [id number] Date of Birth: [birth year]  Beginning of progress report period: March 21, 2023 End of progress report period: March 28, 2023  Today's Date: 03/28/2023 OT Individual Time: 1000-1050 OT Individual Time Calculation (min): 50 min  and Today's Date: 03/28/2023 OT Missed Time: 25 Minutes Missed Time Reason: Unavailable (comment)   Patient has met 2 of 4 short term goals.  Walter Vargas is making strides toward his OT POC with good improvements in functional activity tolerance, functional mobility, and ADLs. He is requiring (S) for UB ADLs, mod A for LB, and mod A for toileting tasks. With UE pushing up he is able to complete the majority of his transfers at a min-mod A level. He is very limited by his cognitive deficits and carryover of education/cueing between sessions. Ongoing conversations are occurring with family/friend supports to evaluate cognition PTA. Currently with his cognition, he would not be safe to live alone and will require 24/7 supervision.   Patient continues to demonstrate the following deficits: muscle weakness, decreased cardiorespiratoy endurance, decreased awareness, decreased problem solving, decreased safety awareness, decreased memory, and delayed processing, and decreased standing balance, decreased postural control, and decreased balance strategies and therefore will continue to benefit from skilled OT intervention to enhance overall performance with BADL and Reduce care partner burden.  Patient progressing toward long term goals..  Continue plan of care.  OT Short Term Goals Week 1:  OT Short Term Goal 1 (Week 1): Pt will don a shirt with min A OT Short Term Goal 1 - Progress (Week 1): Met OT Short Term Goal 2 (Week 1): Pt will complete LB dressing with min A OT Short Term Goal 2 - Progress (Week 1): Not met OT Short Term Goal 3 (Week 1): Pt will complete toilet transfer with min  A OT Short Term Goal 3 - Progress (Week 1): Met OT Short Term Goal 4 (Week 1): Pt will complete ADL routine with no more than 1 rest break to demo increased activity tolerance OT Short Term Goal 4 - Progress (Week 1): Progressing toward goal Week 2:  OT Short Term Goal 1 (Week 2): Pt will complete LB dressing with min A OT Short Term Goal 2 (Week 2): Pt will complete all toileting tasks with min A overall OT Short Term Goal 3 (Week 2): Pt will carryover hand placement for transfers with no cueing 2 consecutive sessions  Skilled Therapeutic Interventions/Progress Updates:    Pt received sitting up in the chair complaining of fatigue and "not feeling well" as well as not sleeping well last night. Vitals assessed and all WNL- HR 62, SpO2 97%. When asked about having any pain pt states "I have a little bit here in my chest", reports it is 4/10. Notified PA and MD. Pt reports pain is gone soon thereafter. He declined leaving the room for any activities. He was agreeable to seated level task. He completed pipe tree model replication task- challenging sequencing, spatial organization, selective attention, and visual perception. He required mod cueing overall for all the aforementioned deficits. His good family friend, [name] was also present during session. He required extra time for problem solving and cueing. He then completed incentive spirometer- improved performance from several days ago, able to pull up to 600 cc with cueing for technique. EKG tech arrived and session was ended. 25 min missed. Pt was left sitting up in the w/c with all needs met, chair alarm set,  and call bell within reach.   Therapy Documentation Precautions:  Precautions Precautions: Fall, Sternal, Other (comment) Precaution Booklet Issued: No Precaution Comments: cortrak, sternal precautions Restrictions Weight Bearing Restrictions Per Provider Order: No RUE Weight Bearing Per Provider Order: Non weight bearing (Sternal  Precautions) LUE Weight Bearing Per Provider Order: Non weight bearing (Sternal Precautions)  Therapy/Group: Individual Therapy  Una Ganser 03/28/2023, 9:52 AM

## 2023-03-28 NOTE — Plan of Care (Signed)
  Problem: RH BOWEL ELIMINATION Goal: RH STG MANAGE BOWEL WITH ASSISTANCE Description: STG Manage Bowel with toileting Assistance. Outcome: Progressing   Problem: RH BLADDER ELIMINATION Goal: RH STG MANAGE BLADDER WITH ASSISTANCE Description: STG Manage Bladder With toileting Assistance Outcome: Progressing   Problem: RH SKIN INTEGRITY Goal: RH STG SKIN FREE OF INFECTION/BREAKDOWN Description: Manage skin with min assist Outcome: Progressing

## 2023-03-28 NOTE — Progress Notes (Signed)
      [address]       [place]arg[place] [id number]             [phone]      Up in chair working with therapist C/o not sleeping well last night and nausea  Sternal and neck wounds ok Right leg with seroma and mild erythema  On Augmentin  Will remove staples from neck incision  [name] C. Luna Salinas, MD [facility] [phone]

## 2023-03-28 NOTE — Progress Notes (Signed)
 Nutrition Follow-up  DOCUMENTATION CODES:   Severe malnutrition in context of chronic illness  INTERVENTION:   -Dysphagia III diet with encouragement. -Provide double protein portions all meals. -Discontinue Ensure per patient preference. -Continue vitamin C , zinc  sulfate to support wound, pressure injury healing.   NUTRITION DIAGNOSIS:   Severe Malnutrition related to chronic illness as evidenced by percent weight loss, moderate fat depletion, severe fat depletion, severe muscle depletion.  -addressing with diet and oral supplements  GOAL:   Patient will meet greater than or equal to 90% of their needs  -progressing, goal met  MONITOR:   PO intake, Weight trends, Supplement acceptance, TF tolerance, Skin, Diet advancement, I & O's, Labs  REASON FOR ASSESSMENT:   Follow up  ASSESSMENT:   78 y/o male admitted to acute rehab with debility. Initially admitted to acute hospitalization with increasing shortness of breath on 02/10/23 ongoing for a few months with chest tightness and abdominal discomfort.   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 Unable to reach patient today, per RN, he is not drinking the Ensure and prefers not to continue receiving.  Current weight down 5# from initial, possibly reflecting fluid, on  diuretics.  Medications reviewed and include vitamin C  500 mg daily, calcium  carbonate  1,250 mg daily with B, PPI, spironolactone , thiamine  100 mg daily, torsemide , zinc  sulfate 220 mg daily.  Labs: sodium 131   Diet Order:   Diet Order             DIET DYS 3 Room service appropriate? Yes; Fluid consistency: Thin; Fluid restriction: 1200 mL Fluid  Diet effective now                   EDUCATION NEEDS:   Not appropriate for education at this time  Skin:  Skin Assessment: Skin Integrity Issues: Skin Integrity Issues:: Stage II, Other (Comment) Stage II: coccyx Incisions: R leg, chest Other: wound to penis-circumferential  Last BM:  03/28/2023, type 6-large  Height:   Ht Readings from Last 1 Encounters:  03/13/23 5\' 8"  (1.727 m)    Weight:   Wt Readings from Last 1 Encounters:  03/28/23 55.6 kg    Ideal Body Weight:  70 kg  BMI:  Body mass index is 18.64 kg/m.  Estimated Nutritional Needs:   Kcal:  2000-2200 kcal/day  Protein:  91-105 gm/day  Fluid:  1800-2000 mL/day    Jaycee Metro, RDLD Clinical Dietitian If unable to reach, please contact "[facility] Inpatient" secure chat group between 8 am-4 pm daily"

## 2023-03-29 ENCOUNTER — Inpatient Hospital Stay (HOSPITAL_COMMUNITY): Payer: 59

## 2023-03-29 MED ORDER — FAMOTIDINE 20 MG PO TABS
20.0000 mg | ORAL_TABLET | Freq: Two times a day (BID) | ORAL | Status: DC
  Administered 2023-03-29 – 2023-04-16 (×32): 20 mg via ORAL
  Filled 2023-03-29 (×36): qty 1

## 2023-03-29 MED ORDER — AMOXICILLIN-POT CLAVULANATE 875-125 MG PO TABS
1.0000 | ORAL_TABLET | Freq: Two times a day (BID) | ORAL | Status: AC
  Administered 2023-03-29 – 2023-03-30 (×2): 1 via ORAL
  Filled 2023-03-29 (×2): qty 1

## 2023-03-29 NOTE — Plan of Care (Signed)
Pt is progressing 

## 2023-03-29 NOTE — Plan of Care (Signed)
  Problem: Consults Goal: RH GENERAL PATIENT EDUCATION Description: See Patient Education module for education specifics. Outcome: Progressing   Problem: RH BOWEL ELIMINATION Goal: RH STG MANAGE BOWEL WITH ASSISTANCE Description: STG Manage Bowel with toileting Assistance. Outcome: Progressing Goal: RH STG MANAGE BOWEL W/MEDICATION W/ASSISTANCE Description: STG Manage Bowel with Medication with Mod I Assistance. Outcome: Progressing   Problem: RH BLADDER ELIMINATION Goal: RH STG MANAGE BLADDER WITH ASSISTANCE Description: STG Manage Bladder With toileting Assistance Outcome: Progressing   Problem: RH SKIN INTEGRITY Goal: RH STG SKIN FREE OF INFECTION/BREAKDOWN Description: Manage skin with min assist Outcome: Progressing Goal: RH STG ABLE TO PERFORM INCISION/WOUND CARE W/ASSISTANCE Description: STG Able To Perform Incision/Wound Care With min  Assistance. Outcome: Progressing   Problem: RH SAFETY Goal: RH STG ADHERE TO SAFETY PRECAUTIONS W/ASSISTANCE/DEVICE Description: STG Adhere to Safety Precautions With cues Outcome: Progressing   Problem: RH PAIN MANAGEMENT Goal: RH STG PAIN MANAGED AT OR BELOW PT'S PAIN GOAL Description: Less than 4 with prns Outcome: Progressing   Problem: RH KNOWLEDGE DEFICIT GENERAL Goal: RH STG INCREASE KNOWLEDGE OF SELF CARE AFTER HOSPITALIZATION Description: Patient will be able to take care of his meds ; diet and Manage skin care using educational resources independently  Outcome: Progressing

## 2023-03-29 NOTE — Progress Notes (Signed)
Occupational Therapy Session Note  Patient Details  Name: Walter Vargas MRN: [id number] Date of Birth: 31-Dec-1945  Today's Date: 03/29/2023 OT Individual Time: 7253-6644 OT Individual Time Calculation (min): 43 min    Short Term Goals: Week 2:  OT Short Term Goal 1 (Week 2): Pt will complete LB dressing with min A OT Short Term Goal 2 (Week 2): Pt will complete all toileting tasks with min A overall OT Short Term Goal 3 (Week 2): Pt will carryover hand placement for transfers with no cueing 2 consecutive sessions  Skilled Therapeutic Interventions/Progress Updates:   Pt seated w/c level and open to therapy session. Pt requested lotion for his legs and arms due to dry skin. OT instructed in figure 4 to apply on LE's without bending and pt able to complete and on arms. OT transported w/c level to and from main gym for theres. Standing level UE restorator chest level with no resistance 1 min forward and backward max and then requested to sit for 2nd set. Low tolerance this session to all tasks. Once back in room, agreeable to use of cardiac pillow for adductor thigh squeezes 10 reps and palm presses 10 reps. Log book entry with min cues for listing tasks of session. Left pt with needs, nurse call button and chair alarm set.   Pain: denies all pain, reports only late day fatigue   Therapy Documentation Precautions:  Precautions Precautions: Fall, Sternal, Other (comment) Precaution Booklet Issued: No Precaution Comments: cortrak, sternal precautions Restrictions Weight Bearing Restrictions Per Provider Order: No RUE Weight Bearing Per Provider Order: Weight bearing as tolerated LUE Weight Bearing Per Provider Order: Weight bearing as tolerated   Therapy/Group: Individual Therapy  Vicenta Dunning 03/29/2023, 7:54 AM

## 2023-03-29 NOTE — Progress Notes (Signed)
PROGRESS NOTE   Subjective/Complaints:   Patient seen at bedside with friend, Walter Vargas.  No questions or concerns today.  Patient states that he is feeling like he can go home independently at this point.  Discussed his progression with therapies and concerns for memory and carryover during tasks.  Patient states he understands providers points, but disagrees with them.  Similar points have been reinforced by staff elsewhere today.  Patient states "what would you think about a group home?".  Discussed that patient will likely need full-time supervision at discharge, and that this would be 1 reasonable way to achieve that.  Patient asked for physician to call son Walter Vargas, who has been trying to get a hold of his medical team.  Discussed with Walter Vargas, who states he was just trying to get a hold of social work regarding the patient's Medicaid.  Did ask generally how Mr. [name] is doing, relayed his excellent medical progress but ongoing cognitive deficits that may or may not improve, and again related need for supervision at discharge.  Son says he is understanding of this.  All other questions answered.  Patient asking for grounds pass to go off of the unit with friend Walter Vargas to [place] to get some soup.  Patient understanding of current dietary restrictions and agreeable to maintaining these while he is off the unit.  No further nausea or vomiting this a.m.  ROS: Denies fevers, chills,   constipation, diarrhea, SOB, cough, chest pain, new weakness or paraesthesias.   + Right lower extremity pain/tingling over cellulitis site - improving  Objective:   No results found.  Recent Labs    03/28/23 0536  WBC 6.4  HGB 11.5*  HCT 36.2*  PLT 473*    Recent Labs    03/28/23 0536  NA 131*  K 4.6  CL 98  CO2 24  GLUCOSE 97  BUN 34*  CREATININE 1.02  CALCIUM 8.5*    Intake/Output Summary (Last 24 hours) at 03/29/2023 1258 Last data  filed at 03/29/2023 0747 Gross per 24 hour  Intake 600 ml  Output --  Net 600 ml     Pressure Injury 03/15/23 Coccyx Mid;Lower Stage 2 -  Partial thickness loss of dermis presenting as a shallow open injury with a red, pink wound bed without slough. (Active)  03/15/23 0800  Location: Coccyx  Location Orientation: Mid;Lower  Staging: Stage 2 -  Partial thickness loss of dermis presenting as a shallow open injury with a red, pink wound bed without slough.  Wound Description (Comments):   Present on Admission: Yes    Physical Exam: Vital Signs Blood pressure 118/60, pulse (!) 59, temperature 98.2 F (36.8 C), temperature source Oral, resp. rate 18, weight 55.4 kg, SpO2 97%.   PE: Constitution: Appropriate appearance for age. No apparent distress.  Sitting upright in WC.  Resp: No respiratory distress. No accessory muscle usage. on RA and CTAB.   Cardio: Regular rate and rhythm. Well perfused appearance.  Capillary refill brisk bilaterally Abdomen: soft, Nondistended. Nontender.  +BS throughout. Psych: Appropriate mood and affect.  Mild frustration regarding dispo discussion. Skin: Left upper extremity Mepilex dressing, clean dressing overlying prior PICC site; right upper extremity  IV CDI  + Right inner thigh/calf with scabbing from vein harvest site- healing/improving, staying within margins of prior marking, no longer warm, edema resolved.  Still minimally erythematous, with brisk capillary refill. + Sternal incision well-approximated, healed   Neurologic Exam:  Awake, alert, and oriented to self and place; requires cues for time.  Moderate memory and higher cognitive deficits--ongoing.  Poor carryover.--Ongoing.  Follows all simple commands. Cranial nerves II through XII intact.  Sensation intact.  No fine motor deficits.  No ataxia. Insight: Fair insight into current condition Strength: Antigravity against resistance all 4 extremities grossly 5- out of 5, unchanged  1-16    Assessment/Plan: 1. Functional deficits which require 3+ hours per day of interdisciplinary therapy in a comprehensive inpatient rehab setting. Physiatrist is providing close team supervision and 24 hour management of active medical problems listed below. Physiatrist and rehab team continue to assess barriers to discharge/monitor patient progress toward functional and medical goals  Care Tool:  Bathing    Body parts bathed by patient: Right arm, Left arm, Chest, Abdomen, Front perineal area, Buttocks, Right upper leg, Left upper leg, Face   Body parts bathed by helper: Right lower leg, Left lower leg     Bathing assist Assist Level: Moderate Assistance - Patient 50 - 74%     Upper Body Dressing/Undressing Upper body dressing   What is the patient wearing?: Pull over shirt    Upper body assist Assist Level: Maximal Assistance - Patient 25 - 49%    Lower Body Dressing/Undressing Lower body dressing      What is the patient wearing?: Pants, Underwear/pull up     Lower body assist Assist for lower body dressing: Maximal Assistance - Patient 25 - 49%     Toileting Toileting    Toileting assist Assist for toileting: Moderate Assistance - Patient 50 - 74%     Transfers Chair/bed transfer  Transfers assist     Chair/bed transfer assist level: Moderate Assistance - Patient 50 - 74%     Locomotion Ambulation   Ambulation assist      Assist level: 2 helpers Assistive device: No Device Max distance: 5'   Walk 10 feet activity   Assist  Walk 10 feet activity did not occur: Safety/medical concerns  Assist level:  (able to ambulate with RW, but at basline did not use an AD)     Walk 50 feet activity   Assist Walk 50 feet with 2 turns activity did not occur: Safety/medical concerns         Walk 150 feet activity   Assist Walk 150 feet activity did not occur: Safety/medical concerns         Walk 10 feet on uneven surface   activity   Assist Walk 10 feet on uneven surfaces activity did not occur: Safety/medical concerns         Wheelchair     Assist Is the patient using a wheelchair?: Yes Type of Wheelchair: Manual Wheelchair activity did not occur:  (Sternal precautions)  Wheelchair assist level: Dependent - Patient 0% Max wheelchair distance: 150'    Wheelchair 50 feet with 2 turns activity    Assist    Wheelchair 50 feet with 2 turns activity did not occur: Safety/medical concerns       Wheelchair 150 feet activity     Assist  Wheelchair 150 feet activity did not occur: Safety/medical concerns       Blood pressure 118/60, pulse (!) 59, temperature 98.2 F (36.8 C),  temperature source Oral, resp. rate 18, weight 55.4 kg, SpO2 97%.  Medical Problem List and Plan: 1. 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  Sternal precautions             -patient may shower             -ELOS/Goals: 14-18 days supervision to mod I-- With supervision goals DC 1/25; will need family meeting to discuss supervision/goals  -Stable to continue CIR  1/14: Needs to be independent at discharge per SW; son has moved out of state and can maybe provide 1-2 weeks on discharge. Patient's son's GF has provided some support in the hospital. Carryover very poor; Max-total assist with sternal precautions, Min A if able to push up. Difficulty with LBD and toiletting tasks - will forget what he is doing with toileting tasks. Per PT has been needing lots of help at home too. Per SW presenting like dementia cog-wise. Doing well with Dys 3. Will need assistance at discharge; SW to discuss with family.   1-16: Discussed patient dispo requirements with son Walter Vargas, emphasizing need for full supervision at discharge and anticipating will be greater than a 1 to 2 months needed.  Did discuss how current cognitive deficits  per SLP discussions are more in line with mild dementia than delirium, but that this is not an official diagnosis.  After discussion with social work, family still unsure who will come in for training, will likely provide supervision for 2 weeks at home prior to deciding whether patient needs more oversight.  2.  Antithrombotics: -DVT/anticoagulation:  Pharmaceutical: Lovenox 40mg  daily.  Venous Doppler studies negative -antiplatelet therapy: Aspirin 81 mg daily.  Heart failure team considering adding Plavix--will need to check back on this prior to discharge  3. Pain Management: Oxycodone as needed--no complaints  4. Mood/Behavior/Sleep: Provide emotional support             - antipsychotic agents: Seroquel 25 mg daily at 1000 and 50 mg nightly   - Continue current regimen, can add melatonin 5 mg as needed  -1-13: Will start to wean off antipsychotics; DC AM Seroquel for tomorrow  1-15: Patient tolerated well, change nightly Seroquel to as needed--has resulted in somewhat interrupted sleep but so far has been tolerable.  Noel Christmas states he gets anxious at night sometimes regarding dispo issues.  5. Neuropsych/cognition: This patient is capable of making decisions on his own behalf.  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  Weaning sedating medications as above.  Will start Aricept 5 mg nightly-theoretical risk of arrhythmia, however on literature review cardiac events matched placebo on meta-analysis.  1-15: Tolerating Aricept well  6. Skin/Wound Care: Routine skin checks. -His right lower extremity harvest site completing course of Ancef for cellulitis --1/8: transitioned to Augmentin 1-8 to complete 10-day course--appearance slightly more edematous/erythematous 1-14, will get WBC in a.m. and monitor closely, still staying within margins of prior marking so may just be taking a long time  to resolve  1-13: DC PICC line--site looks clean.   7. Fluids/Electrolytes/Nutrition/hyponatremia: Routine in and outs with follow-up chemistries, continue supplements.  -1-8: Low albumin, protein; continue current supplemental tube feeds as below.  Encourage protein and p.o. intakes.  Very mild hyponatremia, will recheck on Friday. 1-10: Hyponatremia 129, slight downtrend from 130.  Repeat BMP, serum osmol, urine studies in a.m. to confirm etiology--will be somewhat  complicated by use of torsemide.  Placed on 1200 cc fluid restriction; patient is remaining well below this. -03/24/23 Na up to 132, SOsm WNL, urine studies not done yet -03/25/23 Urine Osm still not done... scheduled weekly labs starting Monday - 1/13: Na improved/stable - repeat Wednesday--add magnesium to today's labs--2.1, stable  1-15: NA stable 131; add calcium supplementation, PRN tums, and increase protonic to 40 mg BID for at bedtime nausea  1-16: Nausea resolved with Protonix  8.  Acute blood loss anemia.  Follow-up CBC -stable   - HgB improving  9.  COVID-positive.  Completed Paxlovid 10.  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  Seen by vascular surgery Dr Carolynn Sayers and plan outpatient follow-up 1 month 11.  History of VT peri-CABG.  Amiodarone 200 mg BID>>daily, Lanoxin 0.125 mg daily  -Remains with low/stable heart rate  -1-14: Patient reporting irregular palpitations during therapies.  None documented, made PT aware so they can document any events during their therapies today.--did not occur per therapies 1/14  12.  Hyperlipidemia.  Lipitor 80mg  daily 13.  Diastolic congestive heart failure.  Torsemide 20 mg daily, Cozaar 25 mg daily, Aldactone 25 mg daily. Dapagliflozin 10mg  daily. Monitor for any signs of fluid overload.  Daily weights. -1-8: No external signs of volume overload.  Heart failure team evaluated, appreciate recommendations and management.  Close monitoring  while switching over to p.o. diuretics - lasix 40 mg daily -1/9: Significant recorded weight gain overnight, however no external signs of volume overload. I/Os -500 yesterday. Weight Likely measurement error, monitor for trend 1-10: Weight slightly down/stable.  I's and O's positive due to tube feeds, however tube feeds being discontinued today.  Monitor sodium as above. -1/11-12/25 wt stable, monitor -13: Weights down to 57.7.  KG, I's and O's positive, likely inaccurate weight.  Euvolemic appearance.  No changes at this time. 03-26-14: Weights continue to downtrend.  No orthostasis; continue current regimen Filed Weights   03/27/23 0600 03/28/23 0500 03/29/23 0454  Weight: 55.9 kg 55.6 kg 55.4 kg    14.  Sacral wound.  Wound care nurse follow-up.  Covered with foam dressing  -1-16: Caught himself on the back during a toileting transfer today per nursing, resulting in small wound, covered in barrier dressing.  15.  Dysphagia.  Dysphagia #2 thin liquids.  Nasogastric tube for nutritional support. Just upgraded diet- might not need Cortrak long -Continue supplemental tube feeds as above, encourage p.o. intakes, protein. -1-10: Eating 60 to 90% of meals per documentation.  Discontinue core track, tube feeds.--Continuing to eat well  16. Stool regimen: colace 200mg  daily  -03/28/23 LBM   17. Thrombocytosis. Improving  LOS: 9 days A FACE TO FACE EVALUATION WAS PERFORMED  Angelina Sheriff 03/29/2023, 12:58 PM

## 2023-03-29 NOTE — Progress Notes (Signed)
Physical Therapy Session Note  Patient Details  Name: SKAI FARAR MRN: [id number] Date of Birth: [birth year]  Today's Date: 03/29/2023 PT Individual Time: 1002-1059 PT Individual Time Calculation (min): 57 min   Today's Date: 03/29/2023 PT Individual Time: 4098-1191 PT Individual Time Calculation (min): 28 min   Short Term Goals: Week 1:  PT Short Term Goal 1 (Week 1): Pt will complete bed mobility with CGA. PT Short Term Goal 2 (Week 1): Pt will completes sit to stand with minA and LRAD. PT Short Term Goal 3 (Week 1): Pt will complete bed to chair with minA and LRAD. PT Short Term Goal 4 (Week 1): Pt will ambulate x50' with minA and LRAD.  Skilled Therapeutic Interventions/Progress Updates:     1st Session: Pt received seated in [facility] and agrees to therapy. No complaint of pain. PT provides therapeutic use of self in listening to pt's concerns for discharge, sa well as reiterating recommendation for 24/7 supervision. WC transport to gym. Pt performs sit to stand with cues for positioning and anterior weight shifting, as well as initiation and sequencing. Pt tasked with standing at high low table and placing photo cars in chronological sequence to provide cognitive challenge in addition to challenging balance and endurance. Pt requires mod to max cueing to correctly sequence photo cards, but does not require physical assistance to remain standing with BUE support on high low table. Pt performs multiple bouts with extended seated rest breaks due to complaints of fatigue. Pt left seated in WC with alarm intact and all needs within reach.   2nd Session: Pt received seated in [facility] and agrees to therapy. No complaint of pain. Pt's family friend, [name], present and pt requesting that [name] attend session. WC transport to gym for time management. Pt performs sit to stand after several attempts, with cues for sequencing and body mechanics. Pt ambulates x175' with RW and cues for posture and safe AD  management. Pt takes seated rest break. Pt then completes x5 reps of sit to stand to work on functional transfer training and strengthening. Pt completes with close supervision and cues for sequencing and weight shifting. Pt then completes x10 reps sit to stand while also counting down form 10 with each rep. Pt able to complete physical and cognitive task accurately. Following rest break, pt performs alternating foot taps on 3" step with BUE support on RW, and tasked with counting down from 20, and then back up to 20, for total of 40 steeps. Pt completes with close supervision but requires min cueing for accurate counting. Following, pt ambulates x175' without AD to challenge dynamic balance and endurance. Pt ambulates with minA overall, with cues to increase gait speed and bilateral stride length to decrease risk for falls. Pt left seated in WC with alarm intact and all needs within reach.   Therapy Documentation Precautions:  Precautions Precautions: Fall, Sternal, Other (comment) Precaution Booklet Issued: No Precaution Comments: cortrak, sternal precautions Restrictions Weight Bearing Restrictions Per Provider Order: No RUE Weight Bearing Per Provider Order: Weight bearing as tolerated LUE Weight Bearing Per Provider Order: Weight bearing as tolerated   Therapy/Group: Individual Therapy  Beau Fanny, PT, DPT 03/29/2023, 4:03 PM

## 2023-03-29 NOTE — Progress Notes (Signed)
Speech Language Pathology Daily Session Note  Patient Details  Name: Walter Vargas MRN: [id number] Date of Birth: 11/28/1945  Today's Date: 03/29/2023 SLP Individual Time: 0272-5366 SLP Individual Time Calculation (min): 54 min  Short Term Goals: Week 2: SLP Short Term Goal 1 (Week 2): Patient will orient to date and time using available external aids with mod assist. SLP Short Term Goal 2 (Week 2): Patient will utilize external memory aids to recall daily information with 50% accuracy given mod assist. SLP Short Term Goal 3 (Week 2): Patient will demonstrate intellectual awareness of deficits as evidenced by stating 2 cognitive and 2 physical deficits with max assist. SLP Short Term Goal 4 (Week 2): Patient will solve basic environmental problems with 75% accuracy given mod assist. SLP Short Term Goal 5 (Week 2): Patient will recall and utilize swallow safety strategy of intermittent throat clear during assessment of least restrictive diet tolerance 90% of the time given mod assist. SLP Short Term Goal 6 (Week 2): Patient will participate in trials of upgraded solids with no overt s/sx of difficulty across two sessions prior to diet upgrade.  Skilled Therapeutic Interventions: SLP conducted skilled therapy session targeting cognitive retraining and dysphagia management goals. At beginning of session, RN in room administering morning medications. As patient has improved orally with swallowing, SLP requested RN administer medications whole in puree for SLP to assess tolerance. Patient initially agreeable, however when presented with whole pills in puree became highly anxious. While patient did trial several bites of whole medications in puree with minimal overt difficulty (one instance of globus sensation that cleared with administration of thin liquid), patient refused final pill, stating they would need to be administered crushed "as finely as possible" moving forward. Recommend continuation of  crushed pills in puree. Cognitively, SLP facilitated various basic cognitive tasks including memory log utilization to organize log for upcoming session and to facilitate discussion re: previous events. Patient exhibiting improvement in use of log, benefiting from just min-mod verbal cues to discuss events of previous PT/OT sessions. Then facilitated clock drawing task, where patient benefited from max assist to draw clock with accurate spacing, though clock appearing much improved from evaluation. When presented with pre-drawn clocks by SLP, patient added hands to accurately reflect requested times given min-mod assist overall. Patient was left in chair with call bell in reach and chair alarm set. SLP will continue to target goals per plan of care.       Pain Pain Assessment Pain Scale: 0-10 Pain Score: 0-No pain  Therapy/Group: Individual Therapy  Yetta Barre 03/29/2023, 3:18 PM

## 2023-03-29 NOTE — Progress Notes (Addendum)
Patient ID: Walter Vargas, male   DOB: 1945-07-29, 78 y.o.   MRN: [id number]  1325-SW had in-depth conversation with pt son Remi Deter to discuss discharge plan. Reports he will discuss with his family who will come to the home first. Reports likely support for first 1-2 weeks but pt will be alone. SW discussed if it has been considered him moving closer towards one of them. This does not appear to be an option. SW discussed options such as HH therapies at discharge, hiring private aide, ALF or SNF barriers, applying for Medicaid, and seeking assistance from an Radio producer. SW also shared if there are any continued concerns, they should submit their own APS referral. Son is unsure on the extent of the relationship between his father and his brother's ex- girlfriend in which the relationship ended a few years before he passed away 10 years ago. Unsure on if she is trustworthy at this time.  Confirmed pt does not have Medicaid. SW emailed pt son Medicaid application if they would like to pursue again. SW will wait for follow-up about plan of care moving forward.   SW sent HHPT/OT/SLP/aide/SW referral to [facility] and waiting on follow-up.  *referral accepted.  [name], MSW, LCSW Office: [phone] Cell: [phone] Fax: [phone]

## 2023-03-30 ENCOUNTER — Ambulatory Visit: Payer: 59 | Admitting: Vascular Surgery

## 2023-03-30 MED ORDER — ONDANSETRON HCL 4 MG PO TABS
4.0000 mg | ORAL_TABLET | Freq: Once | ORAL | Status: AC
  Administered 2023-03-30: 4 mg via ORAL
  Filled 2023-03-30: qty 1

## 2023-03-30 MED ORDER — DONEPEZIL HCL 10 MG PO TABS
5.0000 mg | ORAL_TABLET | Freq: Every day | ORAL | Status: DC
  Administered 2023-03-30 – 2023-04-11 (×12): 5 mg via ORAL
  Filled 2023-03-30 (×13): qty 1

## 2023-03-30 MED ORDER — MELATONIN 5 MG PO TABS
5.0000 mg | ORAL_TABLET | Freq: Every day | ORAL | Status: DC
  Administered 2023-03-30 – 2023-04-14 (×13): 5 mg via ORAL
  Filled 2023-03-30 (×17): qty 1

## 2023-03-30 MED ORDER — TRAZODONE HCL 50 MG PO TABS
50.0000 mg | ORAL_TABLET | Freq: Every evening | ORAL | Status: DC | PRN
  Administered 2023-04-04: 50 mg via ORAL
  Filled 2023-03-30: qty 1

## 2023-03-30 MED ORDER — ONDANSETRON HCL 4 MG PO TABS
4.0000 mg | ORAL_TABLET | Freq: Three times a day (TID) | ORAL | Status: DC | PRN
  Administered 2023-03-31 – 2023-04-04 (×2): 4 mg via ORAL
  Filled 2023-03-30 (×2): qty 1

## 2023-03-30 NOTE — Progress Notes (Signed)
Occupational Therapy Session Note  Patient Details  Name: Walter Vargas MRN: [id number] Date of Birth: January 21, 1946  Session 1 Today's Date: 03/30/2023 OT Individual Time: 4098-1191 OT Individual Time Calculation (min): 55 min    Session 2 Today's Date: 03/30/2023 OT Individual Time: 1450-1505 OT Individual Time Calculation (min): 15 min  and Today's Date: 03/30/2023 OT Missed Time: 15 Minutes Missed Time Reason: Patient fatigue    Short Term Goals: Week 2:  OT Short Term Goal 1 (Week 2): Pt will complete LB dressing with min A OT Short Term Goal 2 (Week 2): Pt will complete all toileting tasks with min A overall OT Short Term Goal 3 (Week 2): Pt will carryover hand placement for transfers with no cueing 2 consecutive sessions  Skilled Therapeutic Interventions/Progress Updates:    Session 1 Pt received sitting in the w/c with no c/o pain. He required encouragement to take a shower but afterwards stated "I really needed that". Initially discussed d/c with pt stating "I'm gonna need a psychiatrist if I stay here another day" and demonstrating poor insight into deficits stating he won't need any help at home. Provided edu on CLOF and deficits impacting ability to be fully independent at home. He also stated he was no longer going to take his medications- provided edu on importance of taking and will talk to SLP on possible advancement from crushed to whole pills d/t pt stating taste of crushed medicine is making him nauseas. He stood from the w/c with CGA and completed an ambulatory transfer into the bathroom with the RW, min A. He requried min cueing to sequence and place hands behind him when transferring to the TTB. He washed UB with min cueing for initiation but no physical assist. CGA for LB bathing when using the grab bar for support. He returned to the w/c following. He donned a shirt with (S). Improved performance in LB dressing today- donning pants and underwear with only min A and was  able to thread over legs. All foam bandages on his sacrum and spine replaced following shower. Min A to don shoes. Pt's friend Walter Vargas arrived with his favorite coffee. Pt was left sitting up in the w/c with all needs met, chair alarm set, and call bell within reach.    Session 2 Pt received sitting in the w/c with no c/o pain but reporting he feels "just tired and not good". He declined any participation in OT however requested a candy bar. OT found a mini reeses and provided it to him for hopeful mood elevation. He was thankful. Discussed medication list and per pt request wrote out current meds and discussed uses. Encouraged him to consult with MD before making any decisions that could interfere with CABG and heart function. Discussed home medication management. He declined any further activity and was left sitting up with all needs met, chair alarm on.    Therapy Documentation Precautions:  Precautions Precautions: Fall, Sternal, Other (comment) Precaution Booklet Issued: No Precaution Comments: cortrak, sternal precautions Restrictions Weight Bearing Restrictions Per Provider Order: No RUE Weight Bearing Per Provider Order: Weight bearing as tolerated LUE Weight Bearing Per Provider Order: Weight bearing as toleratedS    Therapy/Group: Individual Therapy  Walter Vargas 03/30/2023, 6:37 AM

## 2023-03-30 NOTE — Progress Notes (Signed)
Speech Language Pathology Daily Session Note  Patient Details  Name: Walter Vargas MRN: [id number] Date of Birth: 09-24-1945  Today's Date: 03/30/2023 SLP Individual Time: 1012-1103 SLP Individual Time Calculation (min): 51 min  Short Term Goals: Week 2: SLP Short Term Goal 1 (Week 2): Patient will orient to date and time using available external aids with mod assist. SLP Short Term Goal 2 (Week 2): Patient will utilize external memory aids to recall daily information with 50% accuracy given mod assist. SLP Short Term Goal 3 (Week 2): Patient will demonstrate intellectual awareness of deficits as evidenced by stating 2 cognitive and 2 physical deficits with max assist. SLP Short Term Goal 4 (Week 2): Patient will solve basic environmental problems with 75% accuracy given mod assist. SLP Short Term Goal 5 (Week 2): Patient will recall and utilize swallow safety strategy of intermittent throat clear during assessment of least restrictive diet tolerance 90% of the time given mod assist. SLP Short Term Goal 6 (Week 2): Patient will participate in trials of upgraded solids with no overt s/sx of difficulty across two sessions prior to diet upgrade.  Skilled Therapeutic Interventions: SLP conducted skilled therapy session targeting cognitive retraining goals. Patient's family friend, [name], present throughout session providing insight to patient's baseline and preferences, also providing encouragement for patient to continue with speech therapy despite patient's requests to be "done for the day" frequently throughout session. SLP reviewed patient's memory log. Patient was oriented to date using external aids given supervision-min assist for both date and day of the week.Using memory log, patient discussed events of the previous day with min to mod assist overall, showing great improvement from first sessions with memory log, though patient does not use this item spontaneously. SLP then facilitated basic  money management task. SLP provided patient with requested amount of cash, which patient then gathered from available money. Patient benefited from mod assist quickly fading to supervision by end of task. Patient often requested "brain breaks", thus benefited from 1-2 minute break between each prompt. Patient was left in chair with call bell in reach and chair alarm set. SLP will continue to target goals per plan of care.    Pain Pain Assessment Pain Scale: 0-10 Pain Score: 0-No pain  Therapy/Group: Individual Therapy  Jeannie Done, M.A., CCC-SLP  Yetta Barre 03/30/2023, 1:21 PM

## 2023-03-30 NOTE — Progress Notes (Addendum)
PROGRESS NOTE   Subjective/Complaints:  No events overnight.  No acute complaints.  States initially that he had no nausea this a.m., but does complain of difficulty taking crushed pills and food due to foul taste and gagging.  Later, does have some nausea with therapies.  No other complaints today.  Mild hypotension overnight, otherwise vitally stable.  Heart rate remains low/stable. Chest x-ray yesterday showed interval decrease in bilateral pleural effusions and improved aeration of the lungs. No PRNs used overnight.  ROS: Denies fevers, chills,   constipation, diarrhea, SOB, cough, chest pain, new weakness or paraesthesias.   + Intermittent nausea  Objective:   DG CHEST PORT 1 VIEW Result Date: 03/29/2023 CLINICAL DATA:  Cough.  Postop CABG. EXAM: PORTABLE CHEST 1 VIEW COMPARISON:  Radiographs 03/20/2023 and 03/16/2023.  CT 02/10/2023. FINDINGS: 1407 hours. The heart size and mediastinal contours are stable status post median sternotomy and CABG. Feeding tube and left arm PICC have been removed in the interval. Bilateral pleural effusions have decreased in volume, and there is improved aeration of both lungs. No evidence of edema or pneumothorax. No acute osseous findings are seen. There is an old fracture of the left 5th rib IMPRESSION: Interval decrease in volume of bilateral pleural effusions and improved aeration of both lungs. No evidence of edema or pneumothorax. Electronically Signed   By: Carey Bullocks M.D.   On: 03/29/2023 18:13    Recent Labs    03/28/23 0536  WBC 6.4  HGB 11.5*  HCT 36.2*  PLT 473*    Recent Labs    03/28/23 0536  NA 131*  K 4.6  CL 98  CO2 24  GLUCOSE 97  BUN 34*  CREATININE 1.02  CALCIUM 8.5*    Intake/Output Summary (Last 24 hours) at 03/30/2023 0845 Last data filed at 03/30/2023 1324 Gross per 24 hour  Intake 840 ml  Output --  Net 840 ml     Pressure Injury 03/15/23 Coccyx  Mid;Lower Stage 2 -  Partial thickness loss of dermis presenting as a shallow open injury with a red, pink wound bed without slough. (Active)  03/15/23 0800  Location: Coccyx  Location Orientation: Mid;Lower  Staging: Stage 2 -  Partial thickness loss of dermis presenting as a shallow open injury with a red, pink wound bed without slough.  Wound Description (Comments):   Present on Admission: Yes    Physical Exam: Vital Signs Blood pressure 123/64, pulse (!) 56, temperature 98.2 F (36.8 C), resp. rate 18, weight 54.4 kg, SpO2 100%.   PE: Constitution: Appropriate appearance for age. No apparent distress.  Sitting upright in bed. Resp: No respiratory distress. No accessory muscle usage. on RA and CTAB.   Cardio: Regular rate and rhythm. Well perfused appearance.  Capillary refill brisk bilaterally Abdomen: soft, Nondistended. Nontender.  +BS throughout. Psych: Appropriate mood and affect.   Skin: Left upper extremity Mepilex dressing, clean dressing overlying prior PICC site; right upper extremity IV CDI  + Right inner thigh/calf with scabbing from vein harvest site- healing/improving, staying within margins of prior marking, no longer warm, edema resolved.  Still minimally erythematous, with brisk capillary refill. + Sternal incision well-approximated, healed + 4  sutures in lower abdomen, intact, black. + Right clavicular staples removed  Neurologic Exam:  Awake, alert, and oriented to self and place; requires cues for time.  Moderate memory and higher cognitive deficits--ongoing.  Poor carryover.--Ongoing.  Follows all simple commands. Cranial nerves II through XII intact.  Sensation intact.  No fine motor deficits.  No ataxia. Insight: Fair insight into current condition Strength: Antigravity against resistance all 4 extremities grossly 5- out of 5, unchanged 1-17    Assessment/Plan: 1. Functional deficits which require 3+ hours per day of interdisciplinary therapy in a  comprehensive inpatient rehab setting. Physiatrist is providing close team supervision and 24 hour management of active medical problems listed below. Physiatrist and rehab team continue to assess barriers to discharge/monitor patient progress toward functional and medical goals  Care Tool:  Bathing    Body parts bathed by patient: Right arm, Left arm, Chest, Abdomen, Front perineal area, Buttocks, Right upper leg, Left upper leg, Face   Body parts bathed by helper: Right lower leg, Left lower leg     Bathing assist Assist Level: Moderate Assistance - Patient 50 - 74%     Upper Body Dressing/Undressing Upper body dressing   What is the patient wearing?: Pull over shirt    Upper body assist Assist Level: Maximal Assistance - Patient 25 - 49%    Lower Body Dressing/Undressing Lower body dressing      What is the patient wearing?: Pants, Underwear/pull up     Lower body assist Assist for lower body dressing: Maximal Assistance - Patient 25 - 49%     Toileting Toileting    Toileting assist Assist for toileting: Moderate Assistance - Patient 50 - 74%     Transfers Chair/bed transfer  Transfers assist     Chair/bed transfer assist level: Moderate Assistance - Patient 50 - 74%     Locomotion Ambulation   Ambulation assist      Assist level: 2 helpers Assistive device: No Device Max distance: 5'   Walk 10 feet activity   Assist  Walk 10 feet activity did not occur: Safety/medical concerns  Assist level:  (able to ambulate with RW, but at basline did not use an AD)     Walk 50 feet activity   Assist Walk 50 feet with 2 turns activity did not occur: Safety/medical concerns         Walk 150 feet activity   Assist Walk 150 feet activity did not occur: Safety/medical concerns         Walk 10 feet on uneven surface  activity   Assist Walk 10 feet on uneven surfaces activity did not occur: Safety/medical concerns          Wheelchair     Assist Is the patient using a wheelchair?: Yes Type of Wheelchair: Manual Wheelchair activity did not occur:  (Sternal precautions)  Wheelchair assist level: Dependent - Patient 0% Max wheelchair distance: 150'    Wheelchair 50 feet with 2 turns activity    Assist    Wheelchair 50 feet with 2 turns activity did not occur: Safety/medical concerns       Wheelchair 150 feet activity     Assist  Wheelchair 150 feet activity did not occur: Safety/medical concerns       Blood pressure 123/64, pulse (!) 56, temperature 98.2 F (36.8 C), resp. rate 18, weight 54.4 kg, SpO2 100%.  Medical Problem List and Plan: 1. 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  Sternal precautions             -patient may shower             -ELOS/Goals: 14-18 days supervision to mod I-- With supervision goals DC 1/25; will need family meeting to discuss supervision/goals  -Stable to continue CIR  1/14: Needs to be independent at discharge per SW; son has moved out of state and can maybe provide 1-2 weeks on discharge. Patient's son's GF has provided some support in the hospital. Carryover very poor; Max-total assist with sternal precautions, Min A if able to push up. Difficulty with LBD and toiletting tasks - will forget what he is doing with toileting tasks. Per PT has been needing lots of help at home too. Per SW presenting like dementia cog-wise. Doing well with Dys 3. Will need assistance at discharge; SW to discuss with family.   1-16: Discussed patient dispo requirements with son [name], emphasizing need for full supervision at discharge and anticipating will be greater than a 1 to 2 months needed.  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  After discussion with  social work, family still unsure who will come in for training, will likely provide supervision for 2 weeks at home prior to deciding whether patient needs more oversight.  2.  Antithrombotics: -DVT/anticoagulation:  Pharmaceutical: Lovenox 40mg  daily.  Venous Doppler studies negative -antiplatelet therapy: Aspirin 81 mg daily.  Heart failure team considering adding Plavix--not mentioned on their documentation inpatient, will check in prior to discharge  3. Pain Management: Oxycodone as needed--no complaints  4. Mood/Behavior/Sleep: Provide emotional support             - antipsychotic agents: Seroquel 25 mg daily at 1000 and 50 mg nightly   - Continue current regimen, can add melatonin 5 mg as needed  -1-13: Will start to wean off antipsychotics; DC AM Seroquel for tomorrow  1-15: Patient tolerated well, change nightly Seroquel to as needed--has resulted in somewhat interrupted sleep but so far has been tolerable.  Noel Christmas states he gets anxious at night sometimes regarding dispo issues.  1/17: No PRNs used.  Continue melatonin, will add nightly as needed trazodone 50 mg retime at bedtime medications to 8 PM given this is his normal bedtime  5. Neuropsych/cognition: This patient is capable of making decisions on his own behalf.  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  Weaning sedating medications as above.  Will start Aricept 5 mg nightly-theoretical risk of arrhythmia, however on literature review cardiac events matched placebo on meta-analysis.  1-15: Tolerating Aricept well--and noticing some mild cognitive improvements  6. Skin/Wound Care: Routine skin checks. -His right lower extremity harvest site completing course of Ancef for cellulitis --1/8: transitioned to Augmentin 1-8 to complete 10-day course--appearance slightly more edematous/erythematous 1-14, will get WBC in a.m. and  monitor closely, still staying within margins of prior marking so may just be taking a long time to resolve  1-13: DC PICC line--site looks clean.  -1-17: Right clavicle staples removed.  Remove abdominal sutures today.  7. Fluids/Electrolytes/Nutrition/hyponatremia: Routine in and outs with follow-up chemistries, continue supplements.  -1-8: Low albumin, protein; continue current supplemental tube feeds as below.  Encourage protein and p.o. intakes.  Very mild hyponatremia, will recheck on Friday. 1-10: Hyponatremia 129, slight downtrend from  130.  Repeat BMP, serum osmol, urine studies in a.m. to confirm etiology--will be somewhat complicated by use of torsemide.  Placed on 1200 cc fluid restriction; patient is remaining well below this. -03/24/23 Na up to 132, SOsm WNL, urine studies not done yet -03/25/23 Urine Osm still not done... scheduled weekly labs starting Monday - 1/13: Na improved/stable - repeat Wednesday--add magnesium to today's labs--2.1, stable  1-15: NA stable 131; add calcium supplementation, PRN tums, and increase protonic to 40 mg BID for at bedtime nausea  1-17: Added as needed Zofran 4 mg every 8 hours, and miscellaneous nursing order to try and divide out medications and not give all at once crushed in applesauce due to intolerance of foul taste  8.  Acute blood loss anemia.  Follow-up CBC -stable   - HgB improving  9.  COVID-positive.  Completed Paxlovid  --17: DC schedule guaifenesin. 10.  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  Seen by vascular surgery Dr Carolynn Sayers and plan outpatient follow-up 1 month 11.  History of VT peri-CABG.  Amiodarone 200 mg BID>>daily, Lanoxin 0.125 mg daily  -Remains with low/stable heart rate  -1-14: Patient reporting irregular palpitations during therapies.  None documented, made PT aware so they can document any events during their therapies today.--did not occur per therapies 1/14  12.   Hyperlipidemia.  Lipitor 80mg  daily 13.  Diastolic congestive heart failure.  Torsemide 20 mg daily, Cozaar 25 mg daily, Aldactone 25 mg daily. Dapagliflozin 10mg  daily. Monitor for any signs of fluid overload.  Daily weights. -1-8: No external signs of volume overload.  Heart failure team evaluated, appreciate recommendations and management.  Close monitoring while switching over to p.o. diuretics - lasix 40 mg daily -1/9: Significant recorded weight gain overnight, however no external signs of volume overload. I/Os -500 yesterday. Weight Likely measurement error, monitor for trend 1-10: Weight slightly down/stable.  I's and O's positive due to tube feeds, however tube feeds being discontinued today.  Monitor sodium as above. -1/11-12/25 wt stable, monitor -13: Weights down to 57.7.  KG, I's and O's positive, likely inaccurate weight.  Euvolemic appearance.  No changes at this time. 03-27-15: Weights continue to downtrend.  No orthostasis; continue current regimen Filed Weights   03/28/23 0500 03/29/23 0454 03/30/23 0500  Weight: 55.6 kg 55.4 kg 54.4 kg    14.  Sacral wound.  Wound care nurse follow-up.  Covered with foam dressing  -1-16: Caught himself on the back during a toileting transfer today per nursing, resulting in small wound, covered in barrier dressing.  15.  Dysphagia.  Dysphagia #2 thin liquids.  Nasogastric tube for nutritional support. Just upgraded diet- might not need Cortrak long -Continue supplemental tube feeds as above, encourage p.o. intakes, protein. -1-10: Eating 60 to 90% of meals per documentation.  Discontinue core track, tube feeds.--Continuing to eat well  16. Stool regimen: colace 200mg  daily  -1-17: DC liquid Colace, having regular bowel movements  17. Thrombocytosis. Improving  LOS: 10 days A FACE TO FACE EVALUATION WAS PERFORMED  Angelina Sheriff 03/30/2023, 8:45 AM

## 2023-03-30 NOTE — Plan of Care (Signed)
  Problem: Consults Goal: RH GENERAL PATIENT EDUCATION Description: See Patient Education module for education specifics. Outcome: Progressing   Problem: RH BOWEL ELIMINATION Goal: RH STG MANAGE BOWEL WITH ASSISTANCE Description: STG Manage Bowel with toileting Assistance. Outcome: Progressing   Problem: RH BOWEL ELIMINATION Goal: RH STG MANAGE BOWEL W/MEDICATION W/ASSISTANCE Description: STG Manage Bowel with Medication with Mod I Assistance. Outcome: Progressing   Problem: RH BLADDER ELIMINATION Goal: RH STG MANAGE BLADDER WITH ASSISTANCE Description: STG Manage Bladder With toileting Assistance Outcome: Progressing   Problem: RH SKIN INTEGRITY Goal: RH STG SKIN FREE OF INFECTION/BREAKDOWN Description: Manage skin with min assist Outcome: Progressing   Problem: RH SKIN INTEGRITY Goal: RH STG ABLE TO PERFORM INCISION/WOUND CARE W/ASSISTANCE Description: STG Able To Perform Incision/Wound Care With min  Assistance. Outcome: Progressing   Problem: RH SAFETY Goal: RH STG ADHERE TO SAFETY PRECAUTIONS W/ASSISTANCE/DEVICE Description: STG Adhere to Safety Precautions With cues Outcome: Progressing   Problem: RH PAIN MANAGEMENT Goal: RH STG PAIN MANAGED AT OR BELOW PT'S PAIN GOAL Description: Less than 4 with prns Outcome: Progressing   Problem: RH KNOWLEDGE DEFICIT GENERAL Goal: RH STG INCREASE KNOWLEDGE OF SELF CARE AFTER HOSPITALIZATION Description: Patient will be able to take care of his meds ; diet and Manage skin care using educational resources independently  Outcome: Progressing

## 2023-03-30 NOTE — Progress Notes (Signed)
Physical Therapy Weekly Progress Note  Patient Details  Name: Walter Vargas MRN: [id number] Date of Birth: 1945-10-07  Beginning of progress report period: March 21, 2023 End of progress report period: March 30, 2023  Today's Date: 03/30/2023 PT Individual Time: 1105-1200 PT Individual Time Calculation (min): 55 min   Patient has met 4 of 4 short term goals. Pt is progressing well toward mobility goals, improving independence with bed mobility, balance, transfers, and ambulation. Pt is performing all mobility at CGA to minA level. Pt has most difficulty with cognition and carryover of PT education and cues, and will benefit from hands on family education proir to discharge.   Patient continues to demonstrate the following deficits muscle weakness, decreased cardiorespiratoy endurance, and decreased sitting balance, decreased standing balance, decreased postural control, and decreased balance strategies and therefore will continue to benefit from skilled PT intervention to increase functional independence with mobility.  Patient progressing toward long term goals..  Continue plan of care.  PT Short Term Goals Week 1:  PT Short Term Goal 1 (Week 1): Pt will complete bed mobility with CGA. PT Short Term Goal 1 - Progress (Week 1): Met PT Short Term Goal 2 (Week 1): Pt will completes sit to stand with minA and LRAD. PT Short Term Goal 2 - Progress (Week 1): Met PT Short Term Goal 3 (Week 1): Pt will complete bed to chair with minA and LRAD. PT Short Term Goal 3 - Progress (Week 1): Met PT Short Term Goal 4 (Week 1): Pt will ambulate x50' with minA and LRAD. PT Short Term Goal 4 - Progress (Week 1): Met Week 2:  PT Short Term Goal 1 (Week 2): STGs = LTGs  Skilled Therapeutic Interventions/Progress Updates:  Community reintegration;Ambulation/gait training;DME/adaptive equipment instruction;Neuromuscular re-education;Psychosocial support;Stair training;UE/LE Strength  taining/ROM;Balance/vestibular training;Functional electrical stimulation;Therapeutic Activities;UE/LE Coordination activities;Pain management;Skin care/wound management;Discharge planning;Cognitive remediation/compensation;Disease management/prevention;Patient/family education;Functional mobility training;Splinting/orthotics;Therapeutic Exercise;Visual/perceptual remediation/compensation   Pt received seated on toilet and agrees to therapy. No complaint of pain. Sit to stand with cues for hand placement and sequencing. Pt utilizes RW to ambulate to sink with cues for posture and positioning. Following, pt transported to gym via [facility] for energy conservation. Pt tasked with ambulating without AD to challenge dynamic balance. Pt ambulates x175', initially with CGA/minA, but requiring modA at end of gait distance due to fatigue and poor safety awareness, attempting to sit prior to being safely in front of WC. Pt ambulates additional bout of x175' with similar assistance and cues provided. Extended seated rest breaks between bouts. WC transport back to room. Left seated with alarm intact and all needs within reach.   Therapy Documentation Precautions:  Precautions Precautions: Fall, Sternal, Other (comment) Precaution Booklet Issued: No Precaution Comments: cortrak, sternal precautions Restrictions Weight Bearing Restrictions Per Provider Order: No RUE Weight Bearing Per Provider Order: Weight bearing as tolerated LUE Weight Bearing Per Provider Order: Weight bearing as tolerated   Therapy/Group: Individual Therapy  Beau Fanny, PT, DPT 03/30/2023, 4:05 PM

## 2023-03-31 ENCOUNTER — Other Ambulatory Visit: Payer: Self-pay

## 2023-03-31 ENCOUNTER — Encounter (HOSPITAL_COMMUNITY): Payer: Self-pay | Admitting: Physical Medicine and Rehabilitation

## 2023-03-31 DIAGNOSIS — K5901 Slow transit constipation: Secondary | ICD-10-CM

## 2023-03-31 MED ORDER — DOCUSATE SODIUM 50 MG/5ML PO LIQD
100.0000 mg | Freq: Every day | ORAL | Status: DC
  Administered 2023-03-31 – 2023-04-02 (×3): 100 mg via ORAL
  Filled 2023-03-31 (×3): qty 10

## 2023-03-31 NOTE — Progress Notes (Signed)
PROGRESS NOTE   Subjective/Complaints:  Pt doing ok, but didn't sleep well. States he had some nausea yesterday but seems to be a bit better today. Denies pain. LBM "a couple days ago", looks like last documented BM was 03/28/23--3 days ago. States he's urinating ok. Denies any other complaints or concerns today.   ROS: Denies fevers, chills,  abd pain, vomiting, constipation, diarrhea, SOB, cough, chest pain, new weakness or paraesthesias.   + Intermittent nausea  Objective:   DG CHEST PORT 1 VIEW Result Date: 03/29/2023 CLINICAL DATA:  Cough.  Postop CABG. EXAM: PORTABLE CHEST 1 VIEW COMPARISON:  Radiographs 03/20/2023 and 03/16/2023.  CT 02/10/2023. FINDINGS: 1407 hours. The heart size and mediastinal contours are stable status post median sternotomy and CABG. Feeding tube and left arm PICC have been removed in the interval. Bilateral pleural effusions have decreased in volume, and there is improved aeration of both lungs. No evidence of edema or pneumothorax. No acute osseous findings are seen. There is an old fracture of the left 5th rib IMPRESSION: Interval decrease in volume of bilateral pleural effusions and improved aeration of both lungs. No evidence of edema or pneumothorax. Electronically Signed   By: Carey Bullocks M.D.   On: 03/29/2023 18:13    No results for input(s): "WBC", "HGB", "HCT", "PLT" in the last 72 hours.   No results for input(s): "NA", "K", "CL", "CO2", "GLUCOSE", "BUN", "CREATININE", "CALCIUM" in the last 72 hours.   Intake/Output Summary (Last 24 hours) at 03/31/2023 1207 Last data filed at 03/31/2023 1202 Gross per 24 hour  Intake 480 ml  Output 200 ml  Net 280 ml     Pressure Injury 03/15/23 Coccyx Mid;Lower Stage 2 -  Partial thickness loss of dermis presenting as a shallow open injury with a red, pink wound bed without slough. (Active)  03/15/23 0800  Location: Coccyx  Location Orientation:  Mid;Lower  Staging: Stage 2 -  Partial thickness loss of dermis presenting as a shallow open injury with a red, pink wound bed without slough.  Wound Description (Comments):   Present on Admission: Yes    Physical Exam: Vital Signs Blood pressure (!) 114/53, pulse (!) 55, temperature 97.7 F (36.5 C), resp. rate 16, weight 55.2 kg, SpO2 100%.   PE: Constitution: Appropriate appearance for age. No apparent distress.  Sitting upright in bed. Resp: No respiratory distress. No accessory muscle usage. on RA and CTAB.   Cardio: a little bradycardic, reg rhythm. Well perfused appearance.  Capillary refill brisk bilaterally Abdomen: soft, Nondistended. Nontender.  +BS throughout, fairly normoactive. Psych: Appropriate mood and affect.    PRIOR EXAMS: Skin: Left upper extremity Mepilex dressing, clean dressing overlying prior PICC site; right upper extremity IV CDI  + Right inner thigh/calf with scabbing from vein harvest site- healing/improving, staying within margins of prior marking, no longer warm, edema resolved.  Still minimally erythematous, with brisk capillary refill. + Sternal incision well-approximated, healed + 4 sutures in lower abdomen, intact, black. + Right clavicular staples removed  Neurologic Exam:  Awake, alert, and oriented to self and place; requires cues for time.  Moderate memory and higher cognitive deficits--ongoing.  Poor carryover.--Ongoing.  Follows all simple  commands. Cranial nerves II through XII intact.  Sensation intact.  No fine motor deficits.  No ataxia. Insight: Fair insight into current condition Strength: Antigravity against resistance all 4 extremities grossly 5- out of 5, unchanged 1-17    Assessment/Plan: 1. Functional deficits which require 3+ hours per day of interdisciplinary therapy in a comprehensive inpatient rehab setting. Physiatrist is providing close team supervision and 24 hour management of active medical problems listed  below. Physiatrist and rehab team continue to assess barriers to discharge/monitor patient progress toward functional and medical goals  Care Tool:  Bathing    Body parts bathed by patient: Right arm, Left arm, Chest, Abdomen, Front perineal area, Buttocks, Right upper leg, Left upper leg, Face, Right lower leg, Left lower leg   Body parts bathed by helper: Right lower leg, Left lower leg     Bathing assist Assist Level: Contact Guard/Touching assist     Upper Body Dressing/Undressing Upper body dressing   What is the patient wearing?: Pull over shirt    Upper body assist Assist Level: Contact Guard/Touching assist    Lower Body Dressing/Undressing Lower body dressing      What is the patient wearing?: Pants, Underwear/pull up     Lower body assist Assist for lower body dressing: Minimal Assistance - Patient > 75%     Toileting Toileting    Toileting assist Assist for toileting: Minimal Assistance - Patient > 75%     Transfers Chair/bed transfer  Transfers assist     Chair/bed transfer assist level: Minimal Assistance - Patient > 75%     Locomotion Ambulation   Ambulation assist      Assist level: 2 helpers Assistive device: No Device Max distance: 5'   Walk 10 feet activity   Assist  Walk 10 feet activity did not occur: Safety/medical concerns  Assist level:  (able to ambulate with RW, but at basline did not use an AD)     Walk 50 feet activity   Assist Walk 50 feet with 2 turns activity did not occur: Safety/medical concerns         Walk 150 feet activity   Assist Walk 150 feet activity did not occur: Safety/medical concerns         Walk 10 feet on uneven surface  activity   Assist Walk 10 feet on uneven surfaces activity did not occur: Safety/medical concerns         Wheelchair     Assist Is the patient using a wheelchair?: Yes Type of Wheelchair: Manual Wheelchair activity did not occur:  (Sternal  precautions)  Wheelchair assist level: Dependent - Patient 0% Max wheelchair distance: 150'    Wheelchair 50 feet with 2 turns activity    Assist    Wheelchair 50 feet with 2 turns activity did not occur: Safety/medical concerns       Wheelchair 150 feet activity     Assist  Wheelchair 150 feet activity did not occur: Safety/medical concerns       Blood pressure (!) 114/53, pulse (!) 55, temperature 97.7 F (36.5 C), resp. rate 16, weight 55.2 kg, SpO2 100%.  Medical Problem List and Plan: 1. 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  Sternal precautions             -patient may shower -ELOS/Goals: 14-18 days supervision to mod I-- With supervision goals DC 1/25; will need family meeting to discuss supervision/goals  -Stable to continue CIR 1/14:  Needs to be independent at discharge per SW; son has moved out of state and can maybe provide 1-2 weeks on discharge. Patient's son's GF has provided some support in the hospital. Carryover very poor; Max-total assist with sternal precautions, Min A if able to push up. Difficulty with LBD and toiletting tasks - will forget what he is doing with toileting tasks. Per PT has been needing lots of help at home too. Per SW presenting like dementia cog-wise. Doing well with Dys 3. Will need assistance at discharge; SW to discuss with family.  1-16: Discussed patient dispo requirements with son [name], emphasizing need for full supervision at discharge and anticipating will be greater than a 1 to 2 months needed.  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  After discussion with social work, family still unsure who will come in for training, will likely provide supervision for 2 weeks at home prior to deciding whether patient needs  more oversight.  2.  Antithrombotics: -DVT/anticoagulation:  Pharmaceutical: Lovenox 40mg  daily.  Venous Doppler studies negative -antiplatelet therapy: Aspirin 81 mg daily.  Heart failure team considering adding Plavix--not mentioned on their documentation inpatient, will check in prior to discharge  3. Pain Management: Oxycodone as needed--no complaints  4. Mood/Behavior/Sleep: Provide emotional support             - antipsychotic agents: Seroquel 25 mg daily at 1000 and 50 mg nightly   - Continue current regimen, can add melatonin 5 mg as needed  -1-13: Will start to wean off antipsychotics; DC AM Seroquel for tomorrow 1-15: Patient tolerated well, change nightly Seroquel to as needed--has resulted in somewhat interrupted sleep but so far has been tolerable.  Noel Christmas states he gets anxious at night sometimes regarding dispo issues. 1/17: No PRNs used.  Continue melatonin, will add nightly as needed trazodone 50 mg retime at bedtime medications to 8 PM given this is his normal bedtime  5. Neuropsych/cognition: This patient is capable of making decisions on his own behalf.  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  Weaning sedating medications as above.  Will start Aricept 5 mg nightly-theoretical risk of arrhythmia, however on literature review cardiac events matched placebo on meta-analysis. 1-15: Tolerating Aricept well--and noticing some mild cognitive improvements  6. Skin/Wound Care: Routine skin checks. -His right lower extremity harvest site completing course of Ancef for cellulitis --1/8: transitioned to Augmentin 1-8 to complete 10-day course--appearance slightly more edematous/erythematous 1-14, will get WBC in a.m. and monitor closely, still staying within margins of prior marking so may just be taking a long time to resolve  1-13: DC PICC line--site looks clean. -1-17: Right  clavicle staples removed.  Remove abdominal sutures today.  7. Fluids/Electrolytes/Nutrition/hyponatremia: Routine in and outs with follow-up chemistries, continue supplements.  -1-8: Low albumin, protein; continue current supplemental tube feeds as below.  Encourage protein and p.o. intakes.  Very mild hyponatremia, will recheck on Friday. 1-10: Hyponatremia 129, slight downtrend from 130.  Repeat BMP, serum osmol, urine studies in a.m. to confirm etiology--will be somewhat complicated by use of torsemide.  Placed on 1200 cc fluid restriction; patient is remaining well below this. -03/24/23 Na up to 132, SOsm WNL, urine studies not done yet -03/25/23 Urine Osm still not done... scheduled weekly labs starting Monday - 1/13: Na improved/stable - repeat Wednesday--add magnesium  to today's labs--2.1, stable 1-15: NA stable 131; add calcium supplementation, PRN tums, and increase protonic to 40 mg BID for at bedtime nausea 1-17: Added as needed Zofran 4 mg every 8 hours, and miscellaneous nursing order to try and divide out medications and not give all at once crushed in applesauce due to intolerance of foul taste  8.  Acute blood loss anemia.  Follow-up CBC -stable   - HgB improving  9.  COVID-positive.  Completed Paxlovid  -1-17: DC schedule guaifenesin. 10.  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  Seen by vascular surgery Dr Carolynn Sayers and plan outpatient follow-up 1 month 11.  History of VT peri-CABG.  Amiodarone 200 mg BID>>daily, Lanoxin 0.125 mg daily  -Remains with low/stable heart rate -1-14: Patient reporting irregular palpitations during therapies.  None documented, made PT aware so they can document any events during their therapies today.--did not occur per therapies 1/14  12.  Hyperlipidemia.  Lipitor 80mg  daily 13.  Diastolic congestive heart failure.  Torsemide 20 mg daily, Cozaar 25 mg daily, Aldactone 25 mg daily. Dapagliflozin 10mg  daily.  Monitor for any signs of fluid overload.  Daily weights. -1-8: No external signs of volume overload.  Heart failure team evaluated, appreciate recommendations and management.  Close monitoring while switching over to p.o. diuretics - lasix 40 mg daily -1/9: Significant recorded weight gain overnight, however no external signs of volume overload. I/Os -500 yesterday. Weight Likely measurement error, monitor for trend 1-10: Weight slightly down/stable.  I's and O's positive due to tube feeds, however tube feeds being discontinued today.  Monitor sodium as above. -1/11-12/25 wt stable, monitor -13: Weights down to 57.7.  KG, I's and O's positive, likely inaccurate weight.  Euvolemic appearance.  No changes at this time. 03-27-15: Weights continue to downtrend.  No orthostasis; continue current regimen -03/31/23 wt fairly stable in last couple days, monitor Filed Weights   03/29/23 0454 03/30/23 0500 03/31/23 0500  Weight: 55.4 kg 54.4 kg 55.2 kg    14.  Sacral wound.  Wound care nurse follow-up.  Covered with foam dressing -1-16: Caught himself on the back during a toileting transfer today per nursing, resulting in small wound, covered in barrier dressing.  15.  Dysphagia.  Dysphagia #2 thin liquids.  Nasogastric tube for nutritional support. Just upgraded diet- might not need Cortrak long -Continue supplemental tube feeds as above, encourage p.o. intakes, protein. -1-10: Eating 60 to 90% of meals per documentation.  Discontinue core track, tube feeds.--Continuing to eat well  16. Stool regimen: colace 200mg  daily  -1-17: DC liquid Colace, having regular bowel movements  -03/31/23 no BM in 3 days, will restart colace at 100mg  daily for now.   17. Thrombocytosis. Improving   LOS: 11 days A FACE TO FACE EVALUATION WAS PERFORMED  [address] 03/31/2023, 12:07 PM

## 2023-03-31 NOTE — Progress Notes (Signed)
Speech Language Pathology Daily Session Note  Patient Details  Name: TAYTON ANTILL MRN: [id number] Date of Birth: 04-26-1945  Today's Date: 03/31/2023 SLP Individual Time: 1204-1220 SLP Individual Time Calculation (min): 16 min  Short Term Goals: Week 2: SLP Short Term Goal 1 (Week 2): Patient will orient to date and time using available external aids with mod assist. SLP Short Term Goal 2 (Week 2): Patient will utilize external memory aids to recall daily information with 50% accuracy given mod assist. SLP Short Term Goal 3 (Week 2): Patient will demonstrate intellectual awareness of deficits as evidenced by stating 2 cognitive and 2 physical deficits with max assist. SLP Short Term Goal 4 (Week 2): Patient will solve basic environmental problems with 75% accuracy given mod assist. SLP Short Term Goal 5 (Week 2): Patient will recall and utilize swallow safety strategy of intermittent throat clear during assessment of least restrictive diet tolerance 90% of the time given mod assist. SLP Short Term Goal 6 (Week 2): Patient will participate in trials of upgraded solids with no overt s/sx of difficulty across two sessions prior to diet upgrade.  Skilled Therapeutic Interventions: SLP conducted skilled therapy session targeting dysphagia management goals. Patient originally unwilling to participate in any speech therapy, stating he "would like to be alone and does not want to do anything at all", though was eventually agreeable upon hearing SLP's desire to assess diet tolerance. SLP presented patient with meal tray containing Dys3 solids and thin liquids. Patient took 3 bites of sandwich during meal, then indicated he would have no more. He also drank carton of milk. Across all trials, no overt difficulty nor s/sx of penetration/aspiration were observed. SLP attempted to initiate cognitive tasks, however patient first indicated "you can write in my memory book but don't include me in it at all, I don't  wan to think about that stuff", and then indicated that he would participate in no further SLP activities. Patient was left in lowered bed with call bell in reach and bed alarm set. SLP will continue to target goals per plan of care.       Pain Pain Assessment Pain Scale: 0-10 Pain Score: 0-No pain  Therapy/Group: Individual Therapy  Jeannie Done, M.A., CCC-SLP  Yetta Barre 03/31/2023, 12:50 PM

## 2023-03-31 NOTE — Progress Notes (Signed)
Unable to elicit participation with OT at this time due to patient report of fatigue. Continue with skilled OT POC.

## 2023-03-31 NOTE — Progress Notes (Signed)
Physical Therapy Session Note  Patient Details  Name: Walter Vargas MRN: [id number] Date of Birth: November 25, 1945  Today's Date: 03/31/2023 PT Individual Time: 4098-1191 PT Individual Time Calculation (min): 0 min   Short Term Goals: Week 1:  PT Short Term Goal 1 (Week 1): Pt will complete bed mobility with CGA. PT Short Term Goal 1 - Progress (Week 1): Met PT Short Term Goal 2 (Week 1): Pt will completes sit to stand with minA and LRAD. PT Short Term Goal 2 - Progress (Week 1): Met PT Short Term Goal 3 (Week 1): Pt will complete bed to chair with minA and LRAD. PT Short Term Goal 3 - Progress (Week 1): Met PT Short Term Goal 4 (Week 1): Pt will ambulate x50' with minA and LRAD. PT Short Term Goal 4 - Progress (Week 1): Met  Skilled Therapeutic Interventions/Progress Updates:  Attempted to see pt for scheduled time this am. Upon entering room pt c/o upset stomach, pain and nausea. Pt unwilling to participate with therapy. Attempted to encourage participation at bed level initially then progressing as able. Pt stating, "I have done enough and I just don't feel well, I am not doing it. " Therapist discussed with pt's nurse, Nelwyn Salisbury, who attempted to encourage pt participation with therapy. Despite education and encouragement from both therapist and nursing pt continued to defer further treatment. P: Continue with PT POC as tolerated.   Therapy Documentation Precautions:  Precautions Precautions: Fall, Sternal, Other (comment) Precaution Booklet Issued: No Precaution Comments: cortrak, sternal precautions Restrictions Weight Bearing Restrictions Per Provider Order: No RUE Weight Bearing Per Provider Order: Weight bearing as tolerated LUE Weight Bearing Per Provider Order: Weight bearing as tolerated General: PT Amount of Missed Time (min): 60 Minutes PT Missed Treatment Reason: Pain;Patient unwilling to participate;Other (Comment) (nausea)   Rayford Halsted 03/31/2023, 8:27 AM

## 2023-04-01 NOTE — Plan of Care (Signed)
  Problem: Consults Goal: RH GENERAL PATIENT EDUCATION Description: See Patient Education module for education specifics. Outcome: Progressing   Problem: RH BOWEL ELIMINATION Goal: RH STG MANAGE BOWEL WITH ASSISTANCE Description: STG Manage Bowel with toileting Assistance. Outcome: Progressing Goal: RH STG MANAGE BOWEL W/MEDICATION W/ASSISTANCE Description: STG Manage Bowel with Medication with Mod I Assistance. Outcome: Progressing

## 2023-04-01 NOTE — Progress Notes (Signed)
PROGRESS NOTE   Subjective/Complaints:  Pt doing ok again today, slept well. Denies pain. LBM yesterday per pt, but looks like last documented BM was 03/28/23--3 days ago; or maybe 1/16? States he's urinating ok. Denies any other complaints or concerns today.   ROS: Denies fevers, chills,  abd pain, vomiting, diarrhea, SOB, cough, chest pain, new weakness or paraesthesias.   + Intermittent nausea-not reported today ?constipation  Objective:   No results found.   No results for input(s): "WBC", "HGB", "HCT", "PLT" in the last 72 hours.   No results for input(s): "NA", "K", "CL", "CO2", "GLUCOSE", "BUN", "CREATININE", "CALCIUM" in the last 72 hours.   Intake/Output Summary (Last 24 hours) at 04/01/2023 1122 Last data filed at 04/01/2023 0503 Gross per 24 hour  Intake 236 ml  Output 850 ml  Net -614 ml     Pressure Injury 03/15/23 Coccyx Mid;Lower Stage 2 -  Partial thickness loss of dermis presenting as a shallow open injury with a red, pink wound bed without slough. (Active)  03/15/23 0800  Location: Coccyx  Location Orientation: Mid;Lower  Staging: Stage 2 -  Partial thickness loss of dermis presenting as a shallow open injury with a red, pink wound bed without slough.  Wound Description (Comments):   Present on Admission: Yes    Physical Exam: Vital Signs Blood pressure (!) 127/55, pulse (!) 56, temperature 97.9 F (36.6 C), resp. rate 16, height 5\' 8"  (1.727 m), weight 50.4 kg, SpO2 100%.   Constitution: Appropriate appearance for age. No apparent distress.  Sitting upright in bed. Resp: No respiratory distress. No accessory muscle usage. on RA and CTAB.   Cardio: a little bradycardic, reg rhythm. Well perfused appearance.  Capillary refill brisk bilaterally Abdomen: soft, Nondistended. Nontender.  +BS throughout, fairly normoactive. Psych: Appropriate mood and affect.   Neuro: memory deficits stable to prior  weekends  PRIOR EXAMS: Skin: Left upper extremity Mepilex dressing, clean dressing overlying prior PICC site; right upper extremity IV CDI  + Right inner thigh/calf with scabbing from vein harvest site- healing/improving, staying within margins of prior marking, no longer warm, edema resolved.  Still minimally erythematous, with brisk capillary refill. + Sternal incision well-approximated, healed + 4 sutures in lower abdomen, intact, black. + Right clavicular staples removed  Neurologic Exam:  Awake, alert, and oriented to self and place; requires cues for time.  Moderate memory and higher cognitive deficits--ongoing.  Poor carryover.--Ongoing.  Follows all simple commands. Cranial nerves II through XII intact.  Sensation intact.  No fine motor deficits.  No ataxia. Insight: Fair insight into current condition Strength: Antigravity against resistance all 4 extremities grossly 5- out of 5, unchanged 1-17    Assessment/Plan: 1. Functional deficits which require 3+ hours per day of interdisciplinary therapy in a comprehensive inpatient rehab setting. Physiatrist is providing close team supervision and 24 hour management of active medical problems listed below. Physiatrist and rehab team continue to assess barriers to discharge/monitor patient progress toward functional and medical goals  Care Tool:  Bathing    Body parts bathed by patient: Right arm, Left arm, Chest, Abdomen, Front perineal area, Buttocks, Right upper leg, Left upper leg, Face, Right lower leg,  Left lower leg   Body parts bathed by helper: Right lower leg, Left lower leg     Bathing assist Assist Level: Contact Guard/Touching assist     Upper Body Dressing/Undressing Upper body dressing   What is the patient wearing?: Pull over shirt    Upper body assist Assist Level: Contact Guard/Touching assist    Lower Body Dressing/Undressing Lower body dressing      What is the patient wearing?: Pants, Underwear/pull  up     Lower body assist Assist for lower body dressing: Minimal Assistance - Patient > 75%     Toileting Toileting    Toileting assist Assist for toileting: Minimal Assistance - Patient > 75%     Transfers Chair/bed transfer  Transfers assist     Chair/bed transfer assist level: Minimal Assistance - Patient > 75%     Locomotion Ambulation   Ambulation assist      Assist level: 2 helpers Assistive device: No Device Max distance: 5'   Walk 10 feet activity   Assist  Walk 10 feet activity did not occur: Safety/medical concerns  Assist level:  (able to ambulate with RW, but at basline did not use an AD)     Walk 50 feet activity   Assist Walk 50 feet with 2 turns activity did not occur: Safety/medical concerns         Walk 150 feet activity   Assist Walk 150 feet activity did not occur: Safety/medical concerns         Walk 10 feet on uneven surface  activity   Assist Walk 10 feet on uneven surfaces activity did not occur: Safety/medical concerns         Wheelchair     Assist Is the patient using a wheelchair?: Yes Type of Wheelchair: Manual Wheelchair activity did not occur:  (Sternal precautions)  Wheelchair assist level: Dependent - Patient 0% Max wheelchair distance: 150'    Wheelchair 50 feet with 2 turns activity    Assist    Wheelchair 50 feet with 2 turns activity did not occur: Safety/medical concerns       Wheelchair 150 feet activity     Assist  Wheelchair 150 feet activity did not occur: Safety/medical concerns       Blood pressure (!) 127/55, pulse (!) 56, temperature 97.9 F (36.6 C), resp. rate 16, height 5\' 8"  (1.727 m), weight 50.4 kg, SpO2 100%.  Medical Problem List and Plan: 1. 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  Sternal precautions             -patient may shower -ELOS/Goals: 14-18  days supervision to mod I-- With supervision goals DC 1/25; will need family meeting to discuss supervision/goals  -Stable to continue CIR 1/14: Needs to be independent at discharge per SW; son has moved out of state and can maybe provide 1-2 weeks on discharge. Patient's son's GF has provided some support in the hospital. Carryover very poor; Max-total assist with sternal precautions, Min A if able to push up. Difficulty with LBD and toiletting tasks - will forget what he is doing with toileting tasks. Per PT has been needing lots of help at home too. Per SW presenting like dementia cog-wise. Doing well with Dys 3. Will need assistance at discharge; SW to discuss with family.  1-16: Discussed patient dispo requirements with son [name], emphasizing need for full supervision at discharge and anticipating will be greater than a 1 to 2  months needed.  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  After discussion with social work, family still unsure who will come in for training, will likely provide supervision for 2 weeks at home prior to deciding whether patient needs more oversight.  2.  Antithrombotics: -DVT/anticoagulation:  Pharmaceutical: Lovenox 40mg  daily.  Venous Doppler studies negative -antiplatelet therapy: Aspirin 81 mg daily.  Heart failure team considering adding Plavix--not mentioned on their documentation inpatient, will check in prior to discharge  3. Pain Management: Oxycodone as needed--no complaints  4. Mood/Behavior/Sleep: Provide emotional support             - antipsychotic agents: Seroquel 25 mg daily at 1000 and 50 mg nightly   - Continue current regimen, can add melatonin 5 mg as needed  -1-13: Will start to wean off antipsychotics; DC AM Seroquel for tomorrow 1-15: Patient tolerated well, change nightly Seroquel to as needed--has resulted in somewhat interrupted  sleep but so far has been tolerable.  Noel Christmas states he gets anxious at night sometimes regarding dispo issues. 1/17: No PRNs used.  Continue melatonin, will add nightly as needed trazodone 50 mg retime at bedtime medications to 8 PM given this is his normal bedtime  5. Neuropsych/cognition: This patient is capable of making decisions on his own behalf.  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  Weaning sedating medications as above.  Will start Aricept 5 mg nightly-theoretical risk of arrhythmia, however on literature review cardiac events matched placebo on meta-analysis. 1-15: Tolerating Aricept well--and noticing some mild cognitive improvements  6. Skin/Wound Care: Routine skin checks. -His right lower extremity harvest site completing course of Ancef for cellulitis --1/8: transitioned to Augmentin 1-8 to complete 10-day course--appearance slightly more edematous/erythematous 1-14, will get WBC in a.m. and monitor closely, still staying within margins of prior marking so may just be taking a long time to resolve  1-13: DC PICC line--site looks clean. -1-17: Right clavicle staples removed.  Remove abdominal sutures today.  7. Fluids/Electrolytes/Nutrition/hyponatremia: Routine in and outs with follow-up chemistries, continue supplements.  -1-8: Low albumin, protein; continue current supplemental tube feeds as below.  Encourage protein and p.o. intakes.  Very mild hyponatremia, will recheck on Friday. 1-10: Hyponatremia 129, slight downtrend from 130.  Repeat BMP, serum osmol, urine studies in a.m. to confirm etiology--will be somewhat complicated by use of torsemide.  Placed on 1200 cc fluid restriction; patient is remaining well below this. -03/24/23 Na up to 132, SOsm WNL, urine studies not done yet -03/25/23 Urine Osm still not done... scheduled weekly labs starting Monday - 1/13: Na  improved/stable - repeat Wednesday--add magnesium to today's labs--2.1, stable 1-15: NA stable 131; add calcium supplementation, PRN tums, and increase protonic to 40 mg BID for at bedtime nausea 1-17: Added as needed Zofran 4 mg every 8 hours, and miscellaneous nursing order to try and divide out medications and not give all at once crushed in applesauce due to intolerance of foul taste  8.  Acute blood loss anemia.  Follow-up CBC -stable   - HgB improving  9.  COVID-positive.  Completed Paxlovid  -1-17: DC schedule guaifenesin.  10.  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  Seen by vascular surgery Dr Carolynn Sayers and plan outpatient follow-up 1 month  11.  History  of VT peri-CABG.  Amiodarone 200 mg BID>>daily, Lanoxin 0.125 mg daily  -Remains with low/stable heart rate -1-14: Patient reporting irregular palpitations during therapies.  None documented, made PT aware so they can document any events during their therapies today.--did not occur per therapies 1/14  12.  Hyperlipidemia.  Lipitor 80mg  daily 13.  Diastolic congestive heart failure.  Torsemide 20 mg daily, Cozaar 25 mg daily, Aldactone 25 mg daily. Dapagliflozin 10mg  daily. Monitor for any signs of fluid overload.  Daily weights. -1-8: No external signs of volume overload.  Heart failure team evaluated, appreciate recommendations and management.  Close monitoring while switching over to p.o. diuretics - lasix 40 mg daily -1/9: Significant recorded weight gain overnight, however no external signs of volume overload. I/Os -500 yesterday. Weight Likely measurement error, monitor for trend 1-10: Weight slightly down/stable.  I's and O's positive due to tube feeds, however tube feeds being discontinued today.  Monitor sodium as above. -1/11-12/25 wt stable, monitor -13: Weights down to 57.7.  KG, I's and O's positive, likely inaccurate weight.  Euvolemic appearance.  No changes at this  time. 03-27-15: Weights continue to downtrend.  No orthostasis; continue current regimen -03/31/23 wt fairly stable in last couple days, monitor -04/01/23 wt very variable, probably inaccurate, VSS, monitor Filed Weights   03/30/23 0500 03/31/23 0500 04/01/23 0501  Weight: 54.4 kg 55.2 kg 50.4 kg    14.  Sacral wound.  Wound care nurse follow-up.  Covered with foam dressing -1-16: Caught himself on the back during a toileting transfer today per nursing, resulting in small wound, covered in barrier dressing.  15.  Dysphagia.  Dysphagia #2 thin liquids.  Nasogastric tube for nutritional support. Just upgraded diet- might not need Cortrak long -Continue supplemental tube feeds as above, encourage p.o. intakes, protein. -1-10: Eating 60 to 90% of meals per documentation.  Discontinue core track, tube feeds.--Continuing to eat well  16. Stool regimen: colace 200mg  daily  -1-17: DC liquid Colace, having regular bowel movements  -03/31/23 no BM in 3 days, will restart colace at 100mg  daily for now.  -04/01/23 pt states BM yesterday but none documented since 1/15, nurse documentation states LBM 1/16, unclear which is correct; asked nursing to please document if he's had BM today, if none today then would advance bowel regimen  17. Thrombocytosis. Improving   LOS: 12 days A FACE TO FACE EVALUATION WAS PERFORMED  [address] 04/01/2023, 11:22 AM

## 2023-04-02 LAB — CBC
HCT: 44.2 % (ref 39.0–52.0)
Hemoglobin: 13.7 g/dL (ref 13.0–17.0)
MCH: 28 pg (ref 26.0–34.0)
MCHC: 31 g/dL (ref 30.0–36.0)
MCV: 90.4 fL (ref 80.0–100.0)
Platelets: 303 10*3/uL (ref 150–400)
RBC: 4.89 MIL/uL (ref 4.22–5.81)
RDW: 17.2 % — ABNORMAL HIGH (ref 11.5–15.5)
WBC: 7.3 10*3/uL (ref 4.0–10.5)
nRBC: 0 % (ref 0.0–0.2)

## 2023-04-02 LAB — BASIC METABOLIC PANEL
Anion gap: 14 (ref 5–15)
BUN: 38 mg/dL — ABNORMAL HIGH (ref 8–23)
CO2: 22 mmol/L (ref 22–32)
Calcium: 9.2 mg/dL (ref 8.9–10.3)
Chloride: 95 mmol/L — ABNORMAL LOW (ref 98–111)
Creatinine, Ser: 1.12 mg/dL (ref 0.61–1.24)
GFR, Estimated: >60 mL/min (ref >60)
Glucose, Bld: 79 mg/dL (ref 70–99)
Potassium: 5.2 mmol/L — ABNORMAL HIGH (ref 3.5–5.1)
Sodium: 131 mmol/L — ABNORMAL LOW (ref 135–145)

## 2023-04-02 MED ORDER — HYDROXYZINE HCL 25 MG PO TABS
25.0000 mg | ORAL_TABLET | Freq: Three times a day (TID) | ORAL | Status: DC | PRN
  Administered 2023-04-06 – 2023-04-07 (×2): 25 mg via ORAL
  Filled 2023-04-02 (×2): qty 1

## 2023-04-02 MED ORDER — SENNOSIDES-DOCUSATE SODIUM 8.6-50 MG PO TABS
1.0000 | ORAL_TABLET | Freq: Every day | ORAL | Status: DC
Start: 2023-04-02 — End: 2023-04-16
  Administered 2023-04-02 – 2023-04-14 (×8): 1 via ORAL
  Filled 2023-04-02 (×12): qty 1

## 2023-04-02 MED ORDER — DOCUSATE SODIUM 50 MG/5ML PO LIQD
100.0000 mg | Freq: Two times a day (BID) | ORAL | Status: DC
  Administered 2023-04-02 – 2023-04-22 (×27): 100 mg via ORAL
  Filled 2023-04-02 (×39): qty 10

## 2023-04-02 NOTE — Progress Notes (Signed)
Physical Therapy Session Note  Patient Details  Name: Walter Vargas MRN: [id number] Date of Birth: May 14, 1945  Today's Date: 04/02/2023 PT Missed Time: 45 Minutes Missed Time Reason: Patient unwilling to participate;Patient fatigue  Short Term Goals: Week 2:  PT Short Term Goal 1 (Week 2): STGs = LTGs  Skilled Therapeutic Interventions/Progress Updates:     PT attempts twice and pt declines therapy both times due to fatigue. PT encourages participation and pt continues to refuse. PT will follow up as able.   Therapy Documentation Precautions:  Precautions Precautions: Fall, Sternal, Other (comment) Precaution Booklet Issued: No Precaution Comments: cortrak, sternal precautions Restrictions Weight Bearing Restrictions Per Provider Order: No RUE Weight Bearing Per Provider Order: Weight bearing as tolerated LUE Weight Bearing Per Provider Order: Weight bearing as tolerated General: PT Amount of Missed Time (min): 45 Minutes PT Missed Treatment Reason: Patient unwilling to participate;Patient fatigue   Therapy/Group: Individual Therapy  Beau Fanny, PT, DPT 04/02/2023, 3:59 PM

## 2023-04-02 NOTE — Progress Notes (Signed)
Occupational Therapy Session Note  Patient Details  Name: Walter Vargas MRN: [id number] Date of Birth: 1945/07/25  Today's Date: 04/02/2023 OT Individual Time: 1450-1530 OT Individual Time Calculation (min): 40 min    Short Term Goals: Week 1:  OT Short Term Goal 1 (Week 1): Pt will don a shirt with min A OT Short Term Goal 1 - Progress (Week 1): Met OT Short Term Goal 2 (Week 1): Pt will complete LB dressing with min A OT Short Term Goal 2 - Progress (Week 1): Not met OT Short Term Goal 3 (Week 1): Pt will complete toilet transfer with min A OT Short Term Goal 3 - Progress (Week 1): Met OT Short Term Goal 4 (Week 1): Pt will complete ADL routine with no more than 1 rest break to demo increased activity tolerance OT Short Term Goal 4 - Progress (Week 1): Progressing toward goal Week 2:  OT Short Term Goal 1 (Week 2): Pt will complete LB dressing with min A OT Short Term Goal 2 (Week 2): Pt will complete all toileting tasks with min A overall OT Short Term Goal 3 (Week 2): Pt will carryover hand placement for transfers with no cueing 2 consecutive sessions    Skilled Therapeutic Interventions/Progress Updates:    Pt received in bed with PT leaving room. Pt has been refusing most of his therapy sessions today.  Used the first part of the session to establish rapport,  work on awareness and memory (how long he has been in the hospital, current limitations and awareness of those limitations, goals for therapy to be able to go home, and sternal precautions). Pt had fairly good recall of course of events with his medical condition and recalled most of his sternal precautions.  Pt has decreased insight into his balance impairments.   Pt did agree to getting up to try to toilet. Sat to EOB with close S, for sit to stand min A as pt pushed back as he rose to stand and hyperextended his back with strong knee extension.  With cues to stand upright, pt ambulated with min A to toilet.  Pt has varus of R  leg.  Pt needing cues for short steps to control stepping pattern.  Pt sat on toilet but unable to void.  Pt then decided to sit in w/c.  Rose to stand with min A to RW and pt ambulated back to w/c. He opted to not don pants so placed blanket over his lap. He did want to don his tennis shoes.   Belt alarm around pt,  call light in reach. Reminded him to call for A.   Therapy Documentation Precautions:  Precautions Precautions: Fall, Sternal, Other (comment) Precaution Booklet Issued: No Precaution Comments: cortrak, sternal precautions Restrictions Weight Bearing Restrictions Per Provider Order: No RUE Weight Bearing Per Provider Order: Weight bearing as tolerated LUE Weight Bearing Per Provider Order: Weight bearing as tolerated    Pain: Pain Assessment Pain Score: 0-No pain ADL: ADL Eating: Supervision/safety Where Assessed-Eating: Bed level Grooming: Supervision/safety Where Assessed-Grooming: Sitting at sink Upper Body Bathing: Supervision/safety Where Assessed-Upper Body Bathing: Sitting at sink Lower Body Bathing: Moderate assistance Where Assessed-Lower Body Bathing: Sitting at sink Upper Body Dressing: Moderate assistance Where Assessed-Upper Body Dressing: Sitting at sink Lower Body Dressing: Maximal assistance Where Assessed-Lower Body Dressing: Sitting at sink Toileting: Maximal assistance Where Assessed-Toileting: Teacher, adult education: Moderate assistance Toilet Transfer Method: Ambulating   Therapy/Group: Individual Therapy  Lowen Mansouri 04/02/2023, 4:58 PM

## 2023-04-02 NOTE — Progress Notes (Signed)
PROGRESS NOTE   Subjective/Complaints:  No acute complaints.  No events overnight. A.m. labs with stable, mild hyponatremia 131; high potassium 5.2, BUN up to 38.  Otherwise, labs stable, hemoglobin and platelets now normalized. Vital stable.  No bowel movements recorded overnight.  ROS: Denies fevers, chills,  abd pain, vomiting, diarrhea, SOB, cough, chest pain, new weakness or paraesthesias.   + Intermittent nausea-not reported today ?constipation  Objective:   No results found.   Recent Labs    04/02/23 0458  WBC 7.3  HGB 13.7  HCT 44.2  PLT 303     Recent Labs    04/02/23 0458  NA 131*  K 5.2*  CL 95*  CO2 22  GLUCOSE 79  BUN 38*  CREATININE 1.12  CALCIUM 9.2     Intake/Output Summary (Last 24 hours) at 04/02/2023 0831 Last data filed at 04/02/2023 0505 Gross per 24 hour  Intake 877 ml  Output 760 ml  Net 117 ml     Pressure Injury 03/15/23 Coccyx Mid;Lower Stage 2 -  Partial thickness loss of dermis presenting as a shallow open injury with a red, pink wound bed without slough. (Active)  03/15/23 0800  Location: Coccyx  Location Orientation: Mid;Lower  Staging: Stage 2 -  Partial thickness loss of dermis presenting as a shallow open injury with a red, pink wound bed without slough.  Wound Description (Comments):   Present on Admission: Yes    Physical Exam: Vital Signs Blood pressure 123/67, pulse (!) 52, temperature 98 F (36.7 C), resp. rate 18, height 5\' 8"  (1.727 m), weight 51.6 kg, SpO2 100%.   Constitution: Appropriate appearance for age. No apparent distress.  Sitting upright in bed. Resp: No respiratory distress. No accessory muscle usage. on RA and CTAB.   Cardio: a little bradycardic, reg rhythm. Well perfused appearance.  Capillary refill brisk bilaterally Abdomen: soft, Nondistended. Nontender.  +BS throughout, fairly normoactive. Psych: Appropriate mood and affect.   Neuro:  memory deficits stable to prior weekends  PRIOR EXAMS: Skin: Left upper extremity Mepilex dressing, clean dressing overlying prior PICC site; right upper extremity IV CDI  + Right inner thigh/calf with scabbing from vein harvest site- healing/improving, staying within margins of prior marking, no longer warm, edema resolved.  Still minimally erythematous, with brisk capillary refill. + Sternal incision well-approximated, healed + 4 sutures in lower abdomen, intact, black. + Right clavicular staples removed  Neurologic Exam:  Awake, alert, and oriented to self and place; requires cues for time.  Moderate memory and higher cognitive deficits--ongoing.  Poor carryover.--Ongoing.  Follows all simple commands. Cranial nerves II through XII intact.  Sensation intact.  No fine motor deficits.  No ataxia. Insight: Fair insight into current condition Strength: Antigravity against resistance all 4 extremities grossly 5- out of 5, unchanged 1-17    Assessment/Plan: 1. Functional deficits which require 3+ hours per day of interdisciplinary therapy in a comprehensive inpatient rehab setting. Physiatrist is providing close team supervision and 24 hour management of active medical problems listed below. Physiatrist and rehab team continue to assess barriers to discharge/monitor patient progress toward functional and medical goals  Care Tool:  Bathing    Body parts  bathed by patient: Right arm, Left arm, Chest, Abdomen, Front perineal area, Buttocks, Right upper leg, Left upper leg, Face, Right lower leg, Left lower leg   Body parts bathed by helper: Right lower leg, Left lower leg     Bathing assist Assist Level: Contact Guard/Touching assist     Upper Body Dressing/Undressing Upper body dressing   What is the patient wearing?: Pull over shirt    Upper body assist Assist Level: Contact Guard/Touching assist    Lower Body Dressing/Undressing Lower body dressing      What is the patient  wearing?: Pants, Underwear/pull up     Lower body assist Assist for lower body dressing: Minimal Assistance - Patient > 75%     Toileting Toileting    Toileting assist Assist for toileting: Minimal Assistance - Patient > 75%     Transfers Chair/bed transfer  Transfers assist     Chair/bed transfer assist level: Minimal Assistance - Patient > 75%     Locomotion Ambulation   Ambulation assist      Assist level: 2 helpers Assistive device: No Device Max distance: 5'   Walk 10 feet activity   Assist  Walk 10 feet activity did not occur: Safety/medical concerns  Assist level:  (able to ambulate with RW, but at basline did not use an AD)     Walk 50 feet activity   Assist Walk 50 feet with 2 turns activity did not occur: Safety/medical concerns         Walk 150 feet activity   Assist Walk 150 feet activity did not occur: Safety/medical concerns         Walk 10 feet on uneven surface  activity   Assist Walk 10 feet on uneven surfaces activity did not occur: Safety/medical concerns         Wheelchair     Assist Is the patient using a wheelchair?: Yes Type of Wheelchair: Manual Wheelchair activity did not occur:  (Sternal precautions)  Wheelchair assist level: Dependent - Patient 0% Max wheelchair distance: 150'    Wheelchair 50 feet with 2 turns activity    Assist    Wheelchair 50 feet with 2 turns activity did not occur: Safety/medical concerns       Wheelchair 150 feet activity     Assist  Wheelchair 150 feet activity did not occur: Safety/medical concerns       Blood pressure 123/67, pulse (!) 52, temperature 98 F (36.7 C), resp. rate 18, height 5\' 8"  (1.727 m), weight 51.6 kg, SpO2 100%.  Medical Problem List and Plan: 1. 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  Sternal precautions             -patient may  shower -ELOS/Goals: 14-18 days supervision to mod I-- With supervision goals DC 1/25; will need family meeting to discuss supervision/goals  -Stable to continue CIR 1/14: Needs to be independent at discharge per SW; son has moved out of state and can maybe provide 1-2 weeks on discharge. Patient's son's GF has provided some support in the hospital. Carryover very poor; Max-total assist with sternal precautions, Min A if able to push up. Difficulty with LBD and toiletting tasks - will forget what he is doing with toileting tasks. Per PT has been needing lots of help at home too. Per SW presenting like dementia cog-wise. Doing well with Dys 3. Will need assistance at discharge; SW to discuss with family.  1-16: Discussed patient  dispo requirements with son [name], emphasizing need for full supervision at discharge and anticipating will be greater than a 1 to 2 months needed.  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  After discussion with social work, family still unsure who will come in for training, will likely provide supervision for 2 weeks at home prior to deciding whether patient needs more oversight.  2.  Antithrombotics: -DVT/anticoagulation:  Pharmaceutical: Lovenox 40mg  daily.  Venous Doppler studies negative -antiplatelet therapy: Aspirin 81 mg daily.  Heart failure team considering adding Plavix--not mentioned on their documentation inpatient, will check in prior to discharge  3. Pain Management: Oxycodone as needed--no complaints  4. Mood/Behavior/Sleep: Provide emotional support             - antipsychotic agents: Seroquel 25 mg daily at 1000 and 50 mg nightly   - Continue current regimen, can add melatonin 5 mg as needed  -1-13: Will start to wean off antipsychotics; DC AM Seroquel for tomorrow 1-15: Patient tolerated well, change nightly Seroquel to as needed--has  resulted in somewhat interrupted sleep but so far has been tolerable.  Noel Christmas states he gets anxious at night sometimes regarding dispo issues. 1/17: No PRNs used.  Continue melatonin, will add nightly as needed trazodone 50 mg retime at bedtime medications to 8 PM given this is his normal bedtime--improved  5. Neuropsych/cognition: This patient is capable of making decisions on his own behalf.  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  Weaning sedating medications as above.  Will start Aricept 5 mg nightly-theoretical risk of arrhythmia, however on literature review cardiac events matched placebo on meta-analysis. 1-15: Tolerating Aricept well--and noticing some mild cognitive improvements  6. Skin/Wound Care: Routine skin checks. -His right lower extremity harvest site completing course of Ancef for cellulitis --1/8: transitioned to Augmentin 1-8 to complete 10-day course--appearance slightly more edematous/erythematous 1-14, will get WBC in a.m. and monitor closely, still staying within margins of prior marking so may just be taking a long time to resolve  1-13: DC PICC line--site looks clean. -1-17: Right clavicle staples removed.  Remove abdominal sutures today.  7. Fluids/Electrolytes/Nutrition/hyponatremia: Routine in and outs with follow-up chemistries, continue supplements.  -1-8: Low albumin, protein; continue current supplemental tube feeds as below.  Encourage protein and p.o. intakes.  Very mild hyponatremia, will recheck on Friday. 1-10: Hyponatremia 129, slight downtrend from 130.  Repeat BMP, serum osmol, urine studies in a.m. to confirm etiology--will be somewhat complicated by use of torsemide.  Placed on 1200 cc fluid restriction; patient is remaining well below this. -03/24/23 Na up to 132, SOsm WNL, urine studies not done yet -03/25/23 Urine Osm still not done... scheduled  weekly labs starting Monday - 1/13: Na improved/stable - repeat Wednesday--add magnesium to today's labs--2.1, stable 1-15: NA stable 131; add calcium supplementation, PRN tums, and increase protonic to 40 mg BID for at bedtime nausea 1-17: Added as needed Zofran 4 mg every 8 hours, and miscellaneous nursing order to try and divide out medications and not give all at once crushed in applesauce due to intolerance of foul taste 1-20: Only 1 use of Zofran over the weekend, no recorded emesis, improved  8.  Acute blood loss anemia.  Follow-up CBC -stable   - HgB improving  9.  COVID-positive.  Completed Paxlovid  -1-17: DC schedule  guaifenesin.  10.  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  Seen by vascular surgery Dr Carolynn Sayers and plan outpatient follow-up 1 month  11.  History of VT peri-CABG.  Amiodarone 200 mg BID>>daily, Lanoxin 0.125 mg daily  -Remains with low/stable heart rate -1-14: Patient reporting irregular palpitations during therapies.  None documented, made PT aware so they can document any events during their therapies today.--did not occur per therapies 1/14  12.  Hyperlipidemia.  Lipitor 80mg  daily 13.  Diastolic congestive heart failure.  Torsemide 20 mg daily, Cozaar 25 mg daily, Aldactone 25 mg daily. Dapagliflozin 10mg  daily. Monitor for any signs of fluid overload.  Daily weights. -1-8: No external signs of volume overload.  Heart failure team evaluated, appreciate recommendations and management.  Close monitoring while switching over to p.o. diuretics - lasix 40 mg daily -1/9: Significant recorded weight gain overnight, however no external signs of volume overload. I/Os -500 yesterday. Weight Likely measurement error, monitor for trend 1-10: Weight slightly down/stable.  I's and O's positive due to tube feeds, however tube feeds being discontinued today.  Monitor sodium as above. -1/11-12/25 wt stable, monitor -13: Weights down to  57.7.  KG, I's and O's positive, likely inaccurate weight.  Euvolemic appearance.  No changes at this time. 03-27-15: Weights continue to downtrend.  No orthostasis; continue current regimen -Weights generally stable, euvolemic appearance, monitor Filed Weights   03/31/23 0500 04/01/23 0501 04/02/23 0426  Weight: 55.2 kg 50.4 kg 51.6 kg    14.  Sacral wound.  Wound care nurse follow-up.  Covered with foam dressing -1-16: Caught himself on the back during a toileting transfer today per nursing, resulting in small wound, covered in barrier dressing.  15.  Dysphagia.  Dysphagia #2 thin liquids.  Nasogastric tube for nutritional support. Just upgraded diet- might not need Cortrak long -Continue supplemental tube feeds as above, encourage p.o. intakes, protein. -1-10: Eating 60 to 90% of meals per documentation.  Discontinue core track, tube feeds.--Continuing to eat well  16. Stool regimen: colace 200mg  daily  -1-17: DC liquid Colace, having regular bowel movements  -03/31/23 no BM in 3 days, will restart colace at 100mg  daily for now.  -04/01/23 pt states BM yesterday but none documented since 1/15, nurse documentation states LBM 1/16, unclear which is correct; asked nursing to please document if he's had BM today, if none today then would advance bowel regimen  -1-20: Increase Colace to 100 mg twice daily, add Senokot S1 tab at night 17. Thrombocytosis.  resolved   LOS: 13 days A FACE TO FACE EVALUATION WAS PERFORMED  Angelina Sheriff 04/02/2023, 8:31 AM

## 2023-04-02 NOTE — Progress Notes (Signed)
Occupational Therapy Session Note  Patient Details  Name: Walter Vargas MRN: [id number] Date of Birth: May 18, 1945  Today's Date: 04/02/2023 OT Individual Time: 1324-4010 OT Individual Time Calculation (min): 63 min    Short Term Goals: Week 2:  OT Short Term Goal 1 (Week 2): Pt will complete LB dressing with min A OT Short Term Goal 2 (Week 2): Pt will complete all toileting tasks with min A overall OT Short Term Goal 3 (Week 2): Pt will carryover hand placement for transfers with no cueing 2 consecutive sessions  Skilled Therapeutic Interventions/Progress Updates:    Pt received supine with no c/o pain, agreeable to OT session. Pt's family friend [name] present during session and assisting with memory notebook and d/c planning. Discussed medication management, IADL planning. Pt with poor insight into deficits and dismissive of OT suggestions at times. He was agreeable to use of bed rail and pill box at home. He came to EOB with mod cueing for encouragement to participate- (S) overall. He required mod cueing for hand placement, poor error recognition/problem solving. Once he placed his hands correctly on the bed he was able to stand with min A to minimize posterior bias. He completed 200 ft of functional mobility at CGA level overall, requiring cueing for RW management, pacing, and upright posture with correction of posterior pelvic bias and core activation. He requested to sit down and declined any further participation out of the room. He was returned to the room via w/c. He transferred to EOB with CGA. He completed 1x10 bilateral knee extension with a 3lb weight applied to his ankles to improve ADL transfer independence. He declined doing this further and returned to supine. He completed 2x10 alternating leg lifts to challenge core strength for improved dynamic sitting balance. He returned to EOB with heavy encouragement and completed 2x5 blocked repetition sit <> stands with focus on improving  carryover of UE placement during transfers and LE strength needed for improved independence with ADLs. Min A required d/t posterior lean. Pt was left supine with all needs met, bed alarm set.     OT continues to have significant safety concerns if pt were to not have direct 24/7 supervision. He displays poor awareness/judgement into deficits, poor carryover of safety/transfer techniques, and memory deficits that impact medication management, ADL completion, IADL completion, and functional mobility.   Therapy Documentation Precautions:  Precautions Precautions: Fall, Sternal, Other (comment) Precaution Booklet Issued: No Precaution Comments: cortrak, sternal precautions Restrictions Weight Bearing Restrictions Per Provider Order: No RUE Weight Bearing Per Provider Order: Weight bearing as tolerated LUE Weight Bearing Per Provider Order: Weight bearing as tolerated  Therapy/Group: Individual Therapy  [name] 04/02/2023, 6:09 AM

## 2023-04-02 NOTE — Progress Notes (Signed)
SLP Cancellation Note  Patient Details Name: Walter Vargas MRN: [id number] DOB: [birth year]   Cancelled treatment:        Patient refused 45 minutes of speech therapy treatment this date. Upon SLP arrival, patient indicates that he would "not be doing any work today" and that he "already told his other therapists." SLP indicated that for this session, patient would not need to get out of bed and asked patient if he would be willing to engage in conversation with SLP. Patient further declined, and when asked further questions by SLP, asked therapist to cease any further attempts to engage. Patient was left in lowered bed with call bell in reach and bed alarm set. SLP will continue to target goals per plan of care.                                                                                              Garey Ham Chesley Veasey 04/02/2023, 2:07 PM

## 2023-04-03 ENCOUNTER — Ambulatory Visit: Payer: 59 | Admitting: Thoracic Surgery (Cardiothoracic Vascular Surgery)

## 2023-04-03 LAB — BASIC METABOLIC PANEL
Anion gap: 11 (ref 5–15)
BUN: 35 mg/dL — ABNORMAL HIGH (ref 8–23)
CO2: 29 mmol/L (ref 22–32)
Calcium: 9.6 mg/dL (ref 8.9–10.3)
Chloride: 93 mmol/L — ABNORMAL LOW (ref 98–111)
Creatinine, Ser: 1.08 mg/dL (ref 0.61–1.24)
GFR, Estimated: >60 mL/min (ref >60)
Glucose, Bld: 79 mg/dL (ref 70–99)
Potassium: 5 mmol/L (ref 3.5–5.1)
Sodium: 133 mmol/L — ABNORMAL LOW (ref 135–145)

## 2023-04-03 LAB — GLUCOSE, CAPILLARY: Glucose-Capillary: 81 mg/dL (ref 70–99)

## 2023-04-03 MED ORDER — ENSURE ENLIVE PO LIQD
237.0000 mL | Freq: Two times a day (BID) | ORAL | Status: DC
Start: 2023-04-03 — End: 2023-04-16
  Administered 2023-04-03 – 2023-04-15 (×15): 237 mL via ORAL

## 2023-04-03 MED ORDER — ADULT MULTIVITAMIN W/MINERALS CH
1.0000 | ORAL_TABLET | Freq: Every day | ORAL | Status: DC
  Administered 2023-04-03 – 2023-04-24 (×20): 1 via ORAL
  Filled 2023-04-03 (×22): qty 1

## 2023-04-03 NOTE — Progress Notes (Signed)
Physical Therapy Session Note  Patient Details  Name: Walter Vargas MRN: [id number] Date of Birth: [birth year]  Today's Date: 04/03/2023 PT Individual Time: 0802-0830 PT Individual Time Calculation (min): 28 min   Today's Date: 04/03/2023 PT Individual Time: 1448-1530 PT Individual Time Calculation (min): 42 min   Short Term Goals: Week 2:  PT Short Term Goal 1 (Week 2): STGs = LTGs  Skilled Therapeutic Interventions/Progress Updates:     1st Session: Pt received seated in [facility] and agrees to therapy. No complaint of pain. Pt and PT talk throughout session about PT recommendations for 24 hour supervision. Pt has a difficult time internalizing PT recommendations, repeatedly asking how many hours PT is recommending that he have assistance at home. WC transport to gym. Pt ambulates x175' with RW and cues for upright posture to improve balance, and increasing proximity to RW for safety. Following rest break, pt ambulates additional x175' with same assistance and cues. Seated rest break. WC transport back to room. Left seated in WC with alarm intact and all needs within reach.   2nd Session: Pt received supine in bed and attempts to refuse therapy secondary to fiatuge, but is agreeable to participate with mod encouragement from PT. No complaint of pain. Supine to sit with bed features and increased time. PT provides assistance to don shoes at EOB. Stand step to [facility] with CGA  and cues for initiation and sequencing. WC transport to virtual apartment. PT and pt discuss pt's routine in his own apartment. Pt performs stand step transfer to bed without AD and with cues for sequencing and positioning. Sit<>supine with cues for sequencing and positioning. Stand step back to [facility] with CGA and cues for increased eccentric control of stand to sit for safety and strengthening. WC transport to dayroom. Pt ambulates x100' without AD, requiring modA with decreased postural stability and poor safety, especially during turns.  PT provides cues to widen stance and pt over exaggerates wide stance, impairing balance more than narrow stance. Following rest break, pt ambulates x175' with slightly improved stability and safety awareness. WC transport back to room. Stand step to bed with CGA and same cues. Left supine with alarm intact and all needs within reach.   Therapy Documentation Precautions:  Precautions Precautions: Fall, Sternal, Other (comment) Precaution Booklet Issued: No Precaution Comments: cortrak, sternal precautions Restrictions Weight Bearing Restrictions Per Provider Order: No RUE Weight Bearing Per Provider Order: Weight bearing as tolerated LUE Weight Bearing Per Provider Order: Weight bearing as tolerated  Therapy/Group: Individual Therapy  Beau Fanny, PT, DPT 04/03/2023, 3:44 PM

## 2023-04-03 NOTE — Progress Notes (Signed)
Nutrition Follow-up  DOCUMENTATION CODES:   Severe malnutrition in context of chronic illness  INTERVENTION:   -Dysphagia III diet, 1200 mL physician ordered fluid restriction. Please encourage meal intakes. -Provide Ensure Enlive po BID, each supplement provides 350 kcal and 20 grams of protein. -Provide micronutrient support to aide in wound healing, vitamin C 500 mg daily,  MVI/Minerals-1 Tab daily.   NUTRITION DIAGNOSIS:   Severe Malnutrition related to chronic illness as evidenced by percent weight loss, moderate fat depletion, severe fat depletion, severe muscle depletion.  -ongoing, addressing with diet, oral supplements  GOAL:   Patient will meet greater than or equal to 90% of their needs  -progressing  MONITOR:   PO intake, Weight trends, Supplement acceptance, TF tolerance, Skin, Diet advancement, I & O's, Labs  REASON FOR ASSESSMENT:   Follow up  ASSESSMENT:   78 y/o male admitted to acute rehab with debility. Initially admitted to acute hospitalization with increasing shortness of breath on 02/10/23 ongoing for a few months with chest tightness and abdominal discomfort.   PMH: HTN, heart failure with reduced ejection fraction, mitral regurgitation, CABG 03/01/23  11/30: admitted 12/19: s/p CABG and impella 5/5 placement 12/20: back to OR for re-exploration d/t post-op bleeding/ABLA; cortrak placed, trickle TF initiated, extubated 12/26 Diet advanced to Dysphagia 1, Thins per SLP 12/27 Changed to nocturnal TF 12/30 OR for Impella removal, Cortrak inadvertently removed during TEE and replaced postop 01/07-MBSS, diet advanced to dysphagia II (finely chopped), thin  liquids 01/10-diet advanced to dysphagia III per SLP, physician ordered fluid restriction of 1200 mL due to hyponatremia 01/11-Cortrak removed  Intakes recorded  average 63% x 8 meals. Patient informs his appetite is decreased due to dislike of the meals. Previously he was to receive double protein  portions, now is agreeable to Ensure again as long as the product is cold.   Current weight down 19# from initial, possibly related mainly to fluid, on diuretics, continue to monitor.  Medications reviewed and include vitamin C 500 mg daily, colace, pepcid, Senokot-S, spironolactone, thiamine 100 mg daily, torsemide.  Labs reviewed  Diet Order:   Diet Order             DIET DYS 3 Room service appropriate? Yes; Fluid consistency: Thin; Fluid restriction: 1200 mL Fluid  Diet effective now                   EDUCATION NEEDS:   Not appropriate for education at this time  Skin:  Skin Assessment: Skin Integrity Issues: Skin Integrity Issues:: Stage II, Other (Comment) Stage II: mid coccyx Incisions: R leg, chest Other: wound to penis-cirumferential  Last BM:  04/04/2023, type 2-medium  Height:   Ht Readings from Last 1 Encounters:  03/31/23 5\' 8"  (1.727 m)    Weight:   Wt Readings from Last 1 Encounters:  04/03/23 49.1 kg    Ideal Body Weight:  70 kg  BMI:  Body mass index is 16.46 kg/m.  Estimated Nutritional Needs:   Kcal:  2000-2200 kcal/day  Protein:  91-105 gm/day  Fluid:  1800-2000 mL/day    Alvino Chapel, RDLD Clinical Dietitian If unable to reach, please contact "RD Inpatient" secure chat group between 8 am-4 pm daily"

## 2023-04-03 NOTE — Plan of Care (Signed)
  Problem: Consults Goal: RH GENERAL PATIENT EDUCATION Description: See Patient Education module for education specifics. Outcome: Progressing   Problem: RH BOWEL ELIMINATION Goal: RH STG MANAGE BOWEL WITH ASSISTANCE Description: STG Manage Bowel with toileting Assistance. Outcome: Progressing   Problem: RH BOWEL ELIMINATION Goal: RH STG MANAGE BOWEL W/MEDICATION W/ASSISTANCE Description: STG Manage Bowel with Medication with Mod I Assistance. Outcome: Progressing   Problem: RH BLADDER ELIMINATION Goal: RH STG MANAGE BLADDER WITH ASSISTANCE Description: STG Manage Bladder With toileting Assistance Outcome: Progressing   Problem: RH KNOWLEDGE DEFICIT GENERAL Goal: RH STG INCREASE KNOWLEDGE OF SELF CARE AFTER HOSPITALIZATION Description: Patient will be able to take care of his meds ; diet and Manage skin care using educational resources independently  Outcome: Progressing   Problem: RH PAIN MANAGEMENT Goal: RH STG PAIN MANAGED AT OR BELOW PT'S PAIN GOAL Description: Less than 4 with prns Outcome: Progressing   Problem: RH SAFETY Goal: RH STG ADHERE TO SAFETY PRECAUTIONS W/ASSISTANCE/DEVICE Description: STG Adhere to Safety Precautions With cues Outcome: Progressing   Problem: RH SKIN INTEGRITY Goal: RH STG ABLE TO PERFORM INCISION/WOUND CARE W/ASSISTANCE Description: STG Able To Perform Incision/Wound Care With min  Assistance. Outcome: Progressing

## 2023-04-03 NOTE — Progress Notes (Signed)
Speech Language Pathology Daily Session Note  Patient Details  Name: Walter Vargas MRN: [id number] Date of Birth: [birth year]  Today's Date: 04/03/2023 SLP Individual Time: 0865-7846 SLP Individual Time Calculation (min): 42 min  Short Term Goals: Week 2: SLP Short Term Goal 1 (Week 2): Patient will orient to date and time using available external aids with mod assist. SLP Short Term Goal 2 (Week 2): Patient will utilize external memory aids to recall daily information with 50% accuracy given mod assist. SLP Short Term Goal 3 (Week 2): Patient will demonstrate intellectual awareness of deficits as evidenced by stating 2 cognitive and 2 physical deficits with max assist. SLP Short Term Goal 4 (Week 2): Patient will solve basic environmental problems with 75% accuracy given mod assist. SLP Short Term Goal 5 (Week 2): Patient will recall and utilize swallow safety strategy of intermittent throat clear during assessment of least restrictive diet tolerance 90% of the time given mod assist. SLP Short Term Goal 6 (Week 2): Patient will participate in trials of upgraded solids with no overt s/sx of difficulty across two sessions prior to diet upgrade.  Skilled Therapeutic Interventions SLP conducted skilled therapy session targeting cognitive retraining and dysphagia management goals. Patient agreeable to speech therapy and SLP conducted various tasks during conversation with patient and family friend, [name], present throughout. Patient and SLP discussed discharge plans and patient provided information re: therapy recommendation of 24/7 supervision at discharge and his thinking that he only needs 12 hours. SLP facilitated improved anticipatory awareness by outlining reasons for 24/7 supervision recommendation with patient verbalizing understanding. Patient was oriented x4 this session and sustained attention to conversation for 30 minutes with supervision overall. During session, patient consumed several bites  of fruit cup with no overt difficulty. Recommend continuation of Dys3/thin liquid diet. Patient was left in lowered bed with call bell in reach and bed alarm set. SLP will continue to target goals per plan of care.       Pain  No pain endorsed   Therapy/Group: Individual Therapy  Jeannie Done, M.A., CCC-SLP  Yetta Barre 04/03/2023, 4:04 PM

## 2023-04-03 NOTE — Progress Notes (Signed)
Occupational Therapy Session Note  Patient Details  Name: Walter Vargas MRN: [id number] Date of Birth: 11/26/1945  Session 1 Today's Date: 04/03/2023 OT Individual Time: 1040-1120 OT Individual Time Calculation (min): 40 min    Session 2 Today's Date: 04/03/2023 OT Individual Time: 9562-1308 OT Individual Time Calculation (min): 45 min     Short Term Goals: Week 2:  OT Short Term Goal 1 (Week 2): Pt will complete LB dressing with min A OT Short Term Goal 2 (Week 2): Pt will complete all toileting tasks with min A overall OT Short Term Goal 3 (Week 2): Pt will carryover hand placement for transfers with no cueing 2 consecutive sessions  Skilled Therapeutic Interventions/Progress Updates:    Session 1 Pt received supine with no c/o pain, agreeable to OT session. Majority of session focused on d/c planning, OT giving pt an overview of CLOF, cognitive deficits that require 24/7, and problem solving with pt through barriers to d/c. Pt able to have insightful conversation but continues to have poor insight into deficits. Attempted to call his son but he did not answer. He completed bed mobility to EOB with (S). Sit > stand with CGA with good carryover of hand placement. He completed 300 ft of functional mobility with min A, cueing required for RW management, pacing, and upright posture. Pt was left sitting up in the w/c with all needs met, chair alarm set, and call bell within reach.   Session 2  Pt received sitting EOB with NT assessing vitals, no c/o pain, agreeable to shower level ADLs. He completed sit > stand from EOB with CGA using RW. Functional mobility into the bathroom with CGA. Min cueing for sequencing transfer onto TTB. UB bathing with (S). LB with CGA standing with the grab bar. He returned to the EOB with CGA using the RW. He required an extended rest break following. He donned a shirt with (S).  He was incredibly fatigued following the shower and requested to return to supine. He  dressed EOB with (S) for UB, mod A for LB 2/2 fatigue. He transitioned into supine and was left with all needs met, bed alarm set. Family friend [name] present.   Therapy Documentation Precautions:  Precautions Precautions: Fall, Sternal, Other (comment) Precaution Booklet Issued: No Precaution Comments: cortrak, sternal precautions Restrictions Weight Bearing Restrictions Per Provider Order: No RUE Weight Bearing Per Provider Order: Weight bearing as tolerated LUE Weight Bearing Per Provider Order: Weight bearing as tolerated  Therapy/Group: Individual Therapy  [name] 04/03/2023, 6:15 AM

## 2023-04-03 NOTE — Progress Notes (Signed)
Patient ID: Walter Vargas, male   DOB: 08-21-1945, 78 y.o.   MRN: [id number]  1143- SW spoke with [name] to provide updates from team conference, and discuss discharge plan. SW shared reports from medical team that [name] and her mother have been supportive in his care and visit him often. HE intends to speak with his sister, and aunt to determine who will provide support. SW encouraged follow-up with sarah as well. Does not report any concerns about [name] assisting. SW reiterated that they will need to re-apply for Medicaid.   *SW met with pt in room to discuss above. He passively understands what SW is sharing. He does undersrand his family is working on deciding who will come here to support him.    [name], MSW, LCSW Office: [phone] Cell: [phone] Fax: [phone]

## 2023-04-03 NOTE — Progress Notes (Signed)
PROGRESS NOTE   Subjective/Complaints:  No acute complaints.  No events overnight. Na improved 133 today, K 5.0, BUN/Cr down.  Vitals stable.   Had extensive talk today regarding patient's dispo planning, including role of [name] and Donetta Potts mother who have been visiting often. Patient states they both come visit him often at baseline and would be "excellent" support people but is concerned regarding them working and not being able to assist. Also states his son does not communicate with them. Patient brings up "maybe I have medicaid in [place] and can move down there with Markham Jordan?"  ROS: Denies fevers, chills,  abd pain, vomiting, diarrhea, SOB, cough, chest pain, new weakness or paraesthesias.   + Nausea w/ pills crushed in food +anxiety  Objective:   No results found.   Recent Labs    04/02/23 0458  WBC 7.3  HGB 13.7  HCT 44.2  PLT 303     Recent Labs    04/02/23 0458 04/03/23 0516  NA 131* 133*  K 5.2* 5.0  CL 95* 93*  CO2 22 29  GLUCOSE 79 79  BUN 38* 35*  CREATININE 1.12 1.08  CALCIUM 9.2 9.6     Intake/Output Summary (Last 24 hours) at 04/03/2023 1014 Last data filed at 04/03/2023 0800 Gross per 24 hour  Intake 777 ml  Output 200 ml  Net 577 ml     Pressure Injury 03/15/23 Coccyx Mid;Lower Stage 2 -  Partial thickness loss of dermis presenting as a shallow open injury with a red, pink wound bed without slough. (Active)  03/15/23 0800  Location: Coccyx  Location Orientation: Mid;Lower  Staging: Stage 2 -  Partial thickness loss of dermis presenting as a shallow open injury with a red, pink wound bed without slough.  Wound Description (Comments):   Present on Admission: Yes    Physical Exam: Vital Signs Blood pressure 126/70, pulse (!) 58, temperature 98.2 F (36.8 C), resp. rate 16, height 5\' 8"  (1.727 m), weight 49.1 kg, SpO2 100%.   Constitution: Appropriate appearance for age. No apparent  distress.   Resp: on RA and CTAB.   Cardio: Mild stable bradycardia, reg rhythm. Well perfused appearance.  Capillary refill brisk bilaterally Abdomen: soft, Nondistended. Nontender.  +BS throughout,  Psych: Appropriate mood and affect.   Skin:   + Right inner thigh/calf with scabbing from vein harvest site-much improved, some mild skin changes but overall erythema, edema, and warmth have resolved  Neurologic Exam:  Awake, alert, and oriented to self, place, day and year; requires options for month. Can make reasonable decisions regarding what he would do if room was on fire. Cannot spell WORLD backwards but has insight that he cannot remember all the letters. Can add change with cues.  + Moderate memory and higher cognitive deficits  - ongoing, improved slightly  Follows all simple commands. Cranial nerves II through XII intact.  Sensation intact.  No fine motor deficits.  No ataxia. Insight: Fair insight into current condition Strength: Antigravity against resistance all 4 extremities grossly 5- out of 5, unchanged 1-21    Assessment/Plan: 1. Functional deficits which require 3+ hours per day of interdisciplinary therapy in a comprehensive inpatient rehab  setting. Physiatrist is providing close team supervision and 24 hour management of active medical problems listed below. Physiatrist and rehab team continue to assess barriers to discharge/monitor patient progress toward functional and medical goals  Care Tool:  Bathing    Body parts bathed by patient: Right arm, Left arm, Chest, Abdomen, Front perineal area, Buttocks, Right upper leg, Left upper leg, Face, Right lower leg, Left lower leg   Body parts bathed by helper: Right lower leg, Left lower leg     Bathing assist Assist Level: Contact Guard/Touching assist     Upper Body Dressing/Undressing Upper body dressing   What is the patient wearing?: Pull over shirt    Upper body assist Assist Level: Contact Guard/Touching  assist    Lower Body Dressing/Undressing Lower body dressing      What is the patient wearing?: Pants, Underwear/pull up     Lower body assist Assist for lower body dressing: Minimal Assistance - Patient > 75%     Toileting Toileting    Toileting assist Assist for toileting: Minimal Assistance - Patient > 75%     Transfers Chair/bed transfer  Transfers assist     Chair/bed transfer assist level: Minimal Assistance - Patient > 75%     Locomotion Ambulation   Ambulation assist      Assist level: 2 helpers Assistive device: No Device Max distance: 5'   Walk 10 feet activity   Assist  Walk 10 feet activity did not occur: Safety/medical concerns  Assist level:  (able to ambulate with RW, but at basline did not use an AD)     Walk 50 feet activity   Assist Walk 50 feet with 2 turns activity did not occur: Safety/medical concerns         Walk 150 feet activity   Assist Walk 150 feet activity did not occur: Safety/medical concerns         Walk 10 feet on uneven surface  activity   Assist Walk 10 feet on uneven surfaces activity did not occur: Safety/medical concerns         Wheelchair     Assist Is the patient using a wheelchair?: Yes Type of Wheelchair: Manual Wheelchair activity did not occur:  (Sternal precautions)  Wheelchair assist level: Dependent - Patient 0% Max wheelchair distance: 150'    Wheelchair 50 feet with 2 turns activity    Assist    Wheelchair 50 feet with 2 turns activity did not occur: Safety/medical concerns       Wheelchair 150 feet activity     Assist  Wheelchair 150 feet activity did not occur: Safety/medical concerns       Blood pressure 126/70, pulse (!) 58, temperature 98.2 F (36.8 C), resp. rate 16, height 5\' 8"  (1.727 m), weight 49.1 kg, SpO2 100%.  Medical Problem List and Plan: 1. 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  Sternal precautions             -patient may shower -ELOS/Goals: 14-18 days supervision to mod I-- With supervision goals DC 1/25; will need family meeting to discuss supervision/goals  -Stable to continue CIR 1/14: Needs to be independent at discharge per SW; son has moved out of state and can maybe provide 1-2 weeks on discharge. Patient's son's GF has provided some support in the hospital. Carryover very poor; Max-total assist with sternal precautions, Min A if able to push up. Difficulty with LBD and toiletting tasks - will  forget what he is doing with toileting tasks. Per PT has been needing lots of help at home too. Per SW presenting like dementia cog-wise. Doing well with Dys 3. Will need assistance at discharge; SW to discuss with family.  1-16: Discussed patient dispo requirements with son [name], emphasizing need for full supervision at discharge and anticipating will be greater than a 1 to 2 months needed.  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  After discussion with social work, family still unsure who will come in for training, will likely provide supervision for 2 weeks at home prior to deciding whether patient needs more oversight. 1/21: At bedtime incontinence per nursing. Per SW family not agreeing to care plan involving [name]/her mother, which was offered; this is consistent with patient's wishes as well. Unsure of dispo plan at this time as family is not offering alternatives.   2.  Antithrombotics: -DVT/anticoagulation:  Pharmaceutical: Lovenox 40mg  daily.  Venous Doppler studies negative -antiplatelet therapy: Aspirin 81 mg daily.  Heart failure team considering adding Plavix--not mentioned on their documentation inpatient, will check in prior to discharge  3. Pain Management: Oxycodone as needed--no complaints  4.  Mood/Behavior/Sleep: Provide emotional support             - antipsychotic agents: Seroquel 25 mg daily at 1000 and 50 mg nightly   - Continue current regimen, can add melatonin 5 mg as needed  -1-13: Will start to wean off antipsychotics; DC AM Seroquel for tomorrow 1-15: Patient tolerated well, change nightly Seroquel to as needed--has resulted in somewhat interrupted sleep but so far has been tolerable.  Noel Christmas states he gets anxious at night sometimes regarding dispo issues. 1/17: No PRNs used.  Continue melatonin, will add nightly as needed trazodone 50 mg retime at bedtime medications to 8 PM given this is his normal bedtime--improved 1-20: Family reporting compulsive calls, anxieties in the early evenings between therapies and sleep.  Patient is sleeping well, no hallucinations or signs of delirium at this time.  Likely early sundowning, adding Atarax for anxiety and informed nursing to be aware of these behaviors.  5. Neuropsych/cognition: This patient is capable of making decisions on his own behalf.  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  Weaning sedating medications as above.  Will start Aricept 5 mg nightly-theoretical risk of arrhythmia, however on literature review cardiac events matched placebo on meta-analysis. 1-15: Tolerating Aricept well--and noticing some mild cognitive improvements 1/21: Per cognitive evaluation, patient can make reasonable decisions for himself although underlying dementia requires some reorientation and cueing  6. Skin/Wound Care: Routine skin checks. -His right lower extremity harvest site completing course of Ancef for cellulitis --1/8: transitioned to Augmentin 1-8 to complete 10-day course--appearance slightly more edematous/erythematous 1-14, will get WBC in a.m. and monitor closely, still staying within margins of prior marking so may just be  taking a long time to resolve  1-13: DC PICC line--site looks clean. -1-17: Right clavicle staples removed.  Remove abdominal sutures today. - 1/21: RLE swelling/infection resolved  7. Fluids/Electrolytes/Nutrition/hyponatremia: Routine in and outs with follow-up chemistries, continue supplements.  -1-8: Low albumin, protein; continue current supplemental tube feeds as below.  Encourage protein and p.o. intakes.  Very mild hyponatremia, will recheck on Friday. 1-10: Hyponatremia 129, slight downtrend from 130.  Repeat BMP, serum osmol, urine studies  in a.m. to confirm etiology--will be somewhat complicated by use of torsemide.  Placed on 1200 cc fluid restriction; patient is remaining well below this. -03/24/23 Na up to 132, SOsm WNL, urine studies not done yet -03/25/23 Urine Osm still not done... scheduled weekly labs starting Monday - 1/13: Na improved/stable - repeat Wednesday--add magnesium to today's labs--2.1, stable 1-15: NA stable 131; add calcium supplementation, PRN tums, and increase protonic to 40 mg BID for at bedtime nausea 1-17: Added as needed Zofran 4 mg every 8 hours, and miscellaneous nursing order to try and divide out medications and not give all at once crushed in applesauce due to intolerance of foul taste 1-20: Only 1 use of Zofran over the weekend, no recorded emesis--still having nausea around crushed pills only  8.  Acute blood loss anemia.  Follow-up CBC -stable   - HgB improving  9.  COVID-positive.  Completed Paxlovid  -1-17: DC schedule guaifenesin.  10.  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  Seen by vascular surgery Dr Carolynn Sayers and plan outpatient follow-up 1 month  11.  History of VT peri-CABG.  Amiodarone 200 mg BID>>daily, Lanoxin 0.125 mg daily  -Remains with low/stable heart rate -1-14: Patient reporting irregular palpitations during therapies.  None documented, made PT aware so they can document any events  during their therapies today.--did not occur per therapies 1/14  12.  Hyperlipidemia.  Lipitor 80mg  daily 13.  Diastolic congestive heart failure.  Torsemide 20 mg daily, Cozaar 25 mg daily, Aldactone 25 mg daily. Dapagliflozin 10mg  daily. Monitor for any signs of fluid overload.  Daily weights. -1-8: No external signs of volume overload.  Heart failure team evaluated, appreciate recommendations and management.  Close monitoring while switching over to p.o. diuretics - lasix 40 mg daily -1/9: Significant recorded weight gain overnight, however no external signs of volume overload. I/Os -500 yesterday. Weight Likely measurement error, monitor for trend 1-10: Weight slightly down/stable.  I's and O's positive due to tube feeds, however tube feeds being discontinued today.  Monitor sodium as above. -1/11-12/25 wt stable, monitor -13: Weights down to 57.7.  KG, I's and O's positive, likely inaccurate weight.  Euvolemic appearance.  No changes at this time. 03-27-15: Weights continue to downtrend.  No orthostasis; continue current regimen -Weights generally stable, euvolemic appearance, monitor Filed Weights   04/01/23 0501 04/02/23 0426 04/03/23 0507  Weight: 50.4 kg 51.6 kg 49.1 kg    14.  Sacral wound.  Wound care nurse follow-up.  Covered with foam dressing -1-16: Caught himself on the back during a toileting transfer today per nursing, resulting in small wound, covered in barrier dressing.  15.  Dysphagia.  Dysphagia #2 thin liquids.  Nasogastric tube for nutritional support. Just upgraded diet- might not need Cortrak long -Continue supplemental tube feeds as above, encourage p.o. intakes, protein. -1-10: Eating 60 to 90% of meals per documentation.  Discontinue core track, tube feeds.--Continuing to eat well  16. Stool regimen: colace 200mg  daily  -1-17: DC liquid Colace, having regular bowel movements  -03/31/23 no BM in 3 days, will restart colace at 100mg  daily for now.  -04/01/23 pt  states BM yesterday but none documented since 1/15, nurse documentation states LBM 1/16, unclear which is correct; asked nursing to please document if he's had BM today, if none today then would advance bowel regimen  -1-20: Increase Colace to 100 mg twice daily, add Senokot S1 tab at night  1/21 LBM, medium  17. Thrombocytosis.  resolved   LOS: 14 days A FACE TO FACE EVALUATION WAS PERFORMED  Angelina Sheriff 04/03/2023, 10:14 AM

## 2023-04-04 DIAGNOSIS — F0280 Dementia in other diseases classified elsewhere without behavioral disturbance: Secondary | ICD-10-CM | POA: Insufficient documentation

## 2023-04-04 MED ORDER — HYDROCERIN EX CREA
TOPICAL_CREAM | Freq: Every day | CUTANEOUS | Status: DC
  Filled 2023-04-04 (×2): qty 113

## 2023-04-04 NOTE — Consult Note (Signed)
Neuropsychological Consultation [facility]patient [facility]   Patient:   Walter Vargas   DOB:   1946/02/28  MR Number:  [id number]  Location:  [facility] [address] [place] [address] Dept: [phone] Loc: [phone]           Date of Service:   04/04/2023  Start Time:   2 PM End Time:   3 PM  Provider/Observer:  Arley Phenix, Psy.D.       Clinical Neuropsychologist       Billing Code/Service: [phone]  Reason for Service:    Walter Vargas is a 78 year old male referred for neuropsychological consultation due to concerns around cognitive functioning and potential factors related to that as well as coping with recent significant cardiovascular illness and interventional cardiological procedures.  Patient has a past medical history including hypertension, heart failure, cardiomyopathy, history of VT in remote past tobacco use.  Patient presented on 02/10/2023 with increased shortness of breath over the prior few months and abdominal discomfort.  Family had noticed changes in mental status worsening during this time.  Patient workup identified significant cardiovascular abnormalities and the patient was scheduled for CABG as well as ventricle assist device.  This was performed on 03/02/2023.  There was some postoperative bleeding addressed.  As the patient stabilized medically therapy evaluations were completed and was admitted onto CIR due to decreased functional mobility and capacity.  During his therapeutic interventions on the inpatient rehab unit it has been noted but there are some significant deficits with regard to attention and memory and executive functioning.  During the clinical visit today the patient was present along with his daughter.  The patient was aware that he had had surgery but was not able to supply a lot of details.  Patient displayed significant attentional deficits, deficits with  regard to new learning and problems with executive functioning.  Patient's daughter acknowledged that she has noticed significant changes that were presenting themselves even before his recent heart surgery.  The patient has actually improved some postsurgery but there continues to be significant ongoing deficits.  The patient is able to learn new information but has difficulty with adequately learning the information as well as he had previously and difficulties with problem-solving, executive functioning and shifting attention etc.  While formal testing was not conducted it was clear that the patient is showing cognitive deficits and multiple cognitive domains.  While MRI has not been able to be done I suspect that there is likely to be significant microvascular ischemic disease and small vessel disease that has been developing along with his cardiovascular disease.  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  The patient is cognitive presentation is not consistent with Alzheimer's, Lewy body dementia or other progressive dementia but rather and most likely cerebrovascular in nature.  The patient does not present as having acute stroke but I suspect that significant small vessel disease and impact from sustained and prolonged cardiovascular insufficiency have led to these cognitive changes.  The patient was able to learn new information but was very concrete in his capacity and attentional issues are significant.  Slowed information processing speed is also noted quite clearly.  HPI for the current admission:    HPI: Walter Osegueda. 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 Per chart  review patient lives alone. 1 level home 3 steps to entry. He has a sister and brother-in-law who plans to provide assistance on discharge. Presented 02/10/2023 with increasing shortness of breath over the last few months as well as chest tightness as well as abdominal discomfort. Family had noted some recent altered mental status. In the ED BNP was 4500 high-sensitivity troponin 13,600-13,871. EKG revealed sinus rhythm at 96 bpm with LVH and secondary repolarization abnormality. Cranial CT scan negative for acute changes. CT of the chest was notable for pulmonary edema and small pleural effusions. He was given IV Lasix and IV heparin and cardiology services consulted. CT of the abdomen pelvis showed a distended gallbladder but otherwise unremarkable. A subsequent HIDA scan ruled out cholecystitis. He had catheterization completed showing left ventricular ejection fraction of less than 20% with global hypokinesis and mild concentric LV hypertrophy. RV function was moderately reduced. There was moderate mitral insufficiency and moderate tricuspid insufficiency. No significant AI or AS. 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 Patient was cleared for surgery and underwent insertion of Impells ventricular assistive device 03/01/2023 per Dr. Gala Romney followed by CABG x 5 03/02/2023 per Dr. Dorris Fetch complicated by postoperative bleeding with reexploration 03/02/2023.[name] Patient was slowly extubated. Postoperative chest tubes have since been removed. Impella removed 12/30. Hospital course placed on Lovenox 03/07/2023 for DVT prophylaxis venous doppler negative. Acute blood loss anemia 9.5 after transfusion receiving 4 units FFP 2 units cryo and 2 platelets and monitored. Initially placed on Zosyn  12/25 for low-grade fever suspect aspiration pneumonia. Develop some cellulitis from right vein harvest site after CABG completing course of Ancef. Bouts of agitation and restlessness placed on Seroquel. He is currently on dysphagia #2 thin liquid diet as well as nasogastric tube for nutritional support. Wound care nurse follow-up for sacral wound cover with foam dressing. Due to patient decreased functional mobility was admitted for a comprehensive rehab program.   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   Postoperative shock, cardiogenic (HCC) 03/09/2023   Protein-calorie malnutrition, severe 03/03/2023   Cardiogenic shock (HCC) 03/01/2023   COVID-19 virus infection 02/18/2023   Heart failure (HCC) 02/14/2023   AKI (acute kidney injury) (HCC) 02/13/2023   Acute exacerbation of CHF (congestive heart failure) (HCC) 02/10/2023   Non-ST elevation (NSTEMI) myocardial infarction (HCC) 02/10/2023   Thickening of wall of gallbladder 02/10/2023   Elevated LFTs 02/10/2023   Chronic diastolic congestive heart failure (HCC) 02/05/2023   Pleural effusion 10/19/2022   Elevated brain natriuretic peptide (BNP) level 10/19/2022   Dyspnea on exertion 10/12/2022   Lower extremity edema 10/12/2022   Elevated blood pressure reading 06/21/2022   Callus 06/21/2022   Umbilical hernia without obstruction and without gangrene 06/21/2022   Impacted cerumen of right ear 05/26/2021   Chronic pain  of both knees 05/26/2021   Preventative health care 05/26/2021   Screening for colon cancer 05/26/2021    Behavioral  Observation/Mental Status:   LADDIE WOODKE  presents as a 78 y.o.-year-old Right handed Caucasian Male who appeared his stated age. his dress was Appropriate and he was Well Groomed and his manners were Appropriate to the situation.  his participation was indicative of Appropriate, Inattentive, and Redirectable behaviors.  There were physical disabilities noted.  he displayed an appropriate level of cooperation and motivation.    Interactions:    Active Inattentive  Attention:   abnormal and attention span appeared shorter than expected for age  Memory:   abnormal; global memory impairment noted  Visuo-spatial:   not examined  Speech (Volume):  low  Speech:   normal; normal  Thought Process:  Circumstantial and Tangential  Concrete and Tangential  Though Content:  WNL; not suicidal and not homicidal  Orientation:   person, place, time/date, and situation  Judgment:   Fair  Planning:   Poor  Affect:    Appropriate  Mood:    Euthymic  Insight:   Shallow  Intelligence:   normal  Psychiatric History:  No prior psychiatric history  Family Med/Psych History:  Family History  Problem Relation Age of Onset   Diabetes Mother        gestational diabetes   Hypertension Father    Alcohol abuse Father    Heart disease Brother    Alcohol abuse Brother    Drug abuse Brother     Impression/DX:   AMARU BYNUM is a 78 year old male referred for neuropsychological consultation due to concerns around cognitive functioning and potential factors related to that as well as coping with recent significant cardiovascular illness and interventional cardiological procedures.  Patient has a past medical history including hypertension, heart failure, cardiomyopathy, history of VT in remote past tobacco use.  Patient presented on 02/10/2023 with increased shortness of breath over the prior few months and abdominal discomfort.  Family had noticed changes in mental status worsening during this time.   Patient workup identified significant cardiovascular abnormalities and the patient was scheduled for CABG as well as ventricle assist device.  This was performed on 03/02/2023.  There was some postoperative bleeding addressed.  As the patient stabilized medically therapy evaluations were completed and was admitted onto CIR due to decreased functional mobility and capacity.  During his therapeutic interventions on the inpatient rehab unit it has been noted but there are some significant deficits with regard to attention and memory and executive functioning.  During the clinical visit today the patient was present along with his daughter.  The patient was aware that he had had surgery but was not able to supply a lot of details.  Patient displayed significant attentional deficits, deficits with regard to new learning and problems with executive functioning.  Patient's daughter acknowledged that she has noticed significant changes that were presenting themselves even before his recent heart surgery.  The patient has actually improved some postsurgery but there continues to be significant ongoing deficits.  The patient is able to learn new information but has difficulty with adequately learning the information as well as he had previously and difficulties with problem-solving, executive functioning and shifting attention etc.  While formal testing was not conducted it was clear that the patient is showing cognitive deficits and multiple cognitive domains.  While MRI has not been able to be done I suspect that there is likely to be significant microvascular  ischemic disease and small vessel disease that has been developing along with his cardiovascular disease.  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  The patient is cognitive presentation is not  consistent with Alzheimer's, Lewy body dementia or other progressive dementia but rather and most likely cerebrovascular in nature.  The patient does not present as having acute stroke but I suspect that significant small vessel disease and impact from sustained and prolonged cardiovascular insufficiency have led to these cognitive changes.  The patient was able to learn new information but was very concrete in his capacity and attentional issues are significant.  Slowed information processing speed is also noted quite clearly.   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         Electronically Signed   _______________________ Arley Phenix, Psy.D. Clinical Neuropsychologist

## 2023-04-04 NOTE — Progress Notes (Signed)
PROGRESS NOTE   Subjective/Complaints:  No acute complaints.  No events overnight. No emesis recorded overnight. No zofran used.  Single episode hypoxia 68% overnight, come up into 90s on RA without intervention--nursing unaware of this episode, unsure what happened. Otherwise vitals stable.  Patient states he is doing well today, no complaints  ROS: Denies fevers, chills,  abd pain, vomiting, diarrhea, SOB, cough, chest pain, new weakness or paraesthesias.   + Nausea w/ pills crushed in food +anxiety  Objective:   No results found.   Recent Labs    04/02/23 0458  WBC 7.3  HGB 13.7  HCT 44.2  PLT 303     Recent Labs    04/02/23 0458 04/03/23 0516  NA 131* 133*  K 5.2* 5.0  CL 95* 93*  CO2 22 29  GLUCOSE 79 79  BUN 38* 35*  CREATININE 1.12 1.08  CALCIUM 9.2 9.6     Intake/Output Summary (Last 24 hours) at 04/04/2023 0810 Last data filed at 04/03/2023 1836 Gross per 24 hour  Intake 472 ml  Output 100 ml  Net 372 ml     Pressure Injury 03/15/23 Coccyx Mid;Lower Stage 2 -  Partial thickness loss of dermis presenting as a shallow open injury with a red, pink wound bed without slough. (Active)  03/15/23 0800  Location: Coccyx  Location Orientation: Mid;Lower  Staging: Stage 2 -  Partial thickness loss of dermis presenting as a shallow open injury with a red, pink wound bed without slough.  Wound Description (Comments):   Present on Admission: Yes    Physical Exam: Vital Signs Blood pressure 135/75, pulse 86, temperature 97.7 F (36.5 C), resp. rate 18, height 5\' 8"  (1.727 m), weight 49 kg, SpO2 100%.   Constitution: Appropriate appearance for age. No apparent distress.  Sitting in therapy gym. Resp: on RA and CTAB.   Cardio: Mild bradycardia, reg rhythm. Well perfused appearance.  Capillary refill brisk bilaterally Abdomen: soft, Nondistended. Nontender.  +BS throughout,  Psych: Appropriate mood  and affect.   Skin:   + Right inner thigh/calf with scabbing from vein harvest site-now with some dry, cracked appearing skin around it.  Cellulitis resolved.  Neurologic Exam:  Awake, alert, and oriented to self, place, day and month/year; not day. + Moderate memory and higher cognitive deficits  - ongoing  Follows all simple commands. Cranial nerves II through XII intact.  Sensation intact.  No fine motor deficits.  No ataxia. Insight: Fair insight into current condition Strength: Antigravity against resistance all 4 extremities grossly 5- out of 5, unchanged 1-22    Assessment/Plan: 1. Functional deficits which require 3+ hours per day of interdisciplinary therapy in a comprehensive inpatient rehab setting. Physiatrist is providing close team supervision and 24 hour management of active medical problems listed below. Physiatrist and rehab team continue to assess barriers to discharge/monitor patient progress toward functional and medical goals  Care Tool:  Bathing    Body parts bathed by patient: Right arm, Left arm, Chest, Abdomen, Front perineal area, Buttocks, Right upper leg, Left upper leg, Face, Right lower leg, Left lower leg   Body parts bathed by helper: Right lower leg, Left lower leg  Bathing assist Assist Level: Contact Guard/Touching assist     Upper Body Dressing/Undressing Upper body dressing   What is the patient wearing?: Pull over shirt    Upper body assist Assist Level: Contact Guard/Touching assist    Lower Body Dressing/Undressing Lower body dressing      What is the patient wearing?: Pants, Underwear/pull up     Lower body assist Assist for lower body dressing: Minimal Assistance - Patient > 75%     Toileting Toileting    Toileting assist Assist for toileting: Minimal Assistance - Patient > 75%     Transfers Chair/bed transfer  Transfers assist     Chair/bed transfer assist level: Minimal Assistance - Patient > 75%      Locomotion Ambulation   Ambulation assist      Assist level: 2 helpers Assistive device: No Device Max distance: 5'   Walk 10 feet activity   Assist  Walk 10 feet activity did not occur: Safety/medical concerns  Assist level:  (able to ambulate with RW, but at basline did not use an AD)     Walk 50 feet activity   Assist Walk 50 feet with 2 turns activity did not occur: Safety/medical concerns         Walk 150 feet activity   Assist Walk 150 feet activity did not occur: Safety/medical concerns         Walk 10 feet on uneven surface  activity   Assist Walk 10 feet on uneven surfaces activity did not occur: Safety/medical concerns         Wheelchair     Assist Is the patient using a wheelchair?: Yes Type of Wheelchair: Manual Wheelchair activity did not occur:  (Sternal precautions)  Wheelchair assist level: Dependent - Patient 0% Max wheelchair distance: 150'    Wheelchair 50 feet with 2 turns activity    Assist    Wheelchair 50 feet with 2 turns activity did not occur: Safety/medical concerns       Wheelchair 150 feet activity     Assist  Wheelchair 150 feet activity did not occur: Safety/medical concerns       Blood pressure 135/75, pulse 86, temperature 97.7 F (36.5 C), resp. rate 18, height 5\' 8"  (1.727 m), weight 49 kg, SpO2 100%.  Medical Problem List and Plan: 1. 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  Sternal precautions             -patient may shower -ELOS/Goals: 14-18 days supervision to mod I-- With supervision goals DC 1/25; will need family meeting to discuss supervision/goals  -Stable to continue CIR 1/14: Needs to be independent at discharge per SW; son has moved out of state and can maybe provide 1-2 weeks on discharge. Patient's son's GF has provided some support in the hospital. Carryover very poor;  Max-total assist with sternal precautions, Min A if able to push up. Difficulty with LBD and toiletting tasks - will forget what he is doing with toileting tasks. Per PT has been needing lots of help at home too. Per SW presenting like dementia cog-wise. Doing well with Dys 3. Will need assistance at discharge; SW to discuss with family.  1-16: Discussed patient dispo requirements with son [name], emphasizing need for full supervision at discharge and anticipating will be greater than a 1 to 2 months needed.  Did discuss how current cognitive deficits per SLP discussions are more in line with mild dementia than delirium, but  that this is not an official diagnosis.  After discussion with social work, family still unsure who will come in for training, will likely provide supervision for 2 weeks at home prior to deciding whether patient needs more oversight. 1/21: At bedtime incontinence per nursing. Per SW family not agreeing to care plan involving [name]/her mother, which was offered; this is consistent with patient's wishes as well. Unsure of dispo plan at this time as family is not offering alternatives.  1-22: Family discussed appealing discharge with social work due to feeling patient is not medically optimized.  Per team, primary barriers remain cognitive, not expected to improve further with additional time.  2.  Antithrombotics: -DVT/anticoagulation:  Pharmaceutical: Lovenox 40mg  daily.  Venous Doppler studies negative -antiplatelet therapy: Aspirin 81 mg daily.  Heart failure team considering adding Plavix--not mentioned on their documentation inpatient, will check in prior to discharge  3. Pain Management: Oxycodone as needed--no complaints  4. Mood/Behavior/Sleep: Provide emotional support             - antipsychotic agents: Seroquel 25 mg daily at 1000 and 50 mg nightly   - Continue current regimen, can add melatonin 5 mg as needed  -1-13: Will start to wean off antipsychotics; DC AM Seroquel  for tomorrow 1-15: Patient tolerated well, change nightly Seroquel to as needed--has resulted in somewhat interrupted sleep but so far has been tolerable.  Noel Christmas states he gets anxious at night sometimes regarding dispo issues. 1/17: No PRNs used.  Continue melatonin, will add nightly as needed trazodone 50 mg retime at bedtime medications to 8 PM given this is his normal bedtime--improved 1-20: Family reporting compulsive calls, anxieties in the early evenings between therapies and sleep.  Patient is sleeping well, no hallucinations or signs of delirium at this time.  Likely early sundowning, adding Atarax for anxiety and informed nursing to be aware of these behaviors. 1-22: No reports by nursing overnight, no use of as needed Atarax   5. Neuropsych/cognition: This patient is capable of making decisions on his own behalf.  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  Weaning sedating medications as above.  Will start Aricept 5 mg nightly-theoretical risk of arrhythmia, however on literature review cardiac events matched placebo on meta-analysis. 1-15: Tolerating Aricept well--and noticing some mild cognitive improvements 1/21: Per cognitive evaluation, patient can make reasonable decisions for himself although underlying dementia requires some reorientation and cueing  6. Skin/Wound Care: Routine skin checks. -His right lower extremity harvest site completing course of Ancef for cellulitis --1/8: transitioned to Augmentin 1-8 to complete 10-day course--appearance slightly more edematous/erythematous 1-14, will get WBC in a.m. and monitor closely, still staying within margins of prior marking so may just be taking a long time to resolve  1-13: DC PICC line--site looks clean. -1-17: Right clavicle staples removed.  Remove abdominal sutures today. - 1/21: RLE swelling/infection resolved 1-22:  Dry skin overlying prior infection site; add Eucerin daily  7. Fluids/Electrolytes/Nutrition/hyponatremia: Routine in and outs with follow-up chemistries, continue supplements.  -1-8: Low albumin, protein; continue current supplemental tube feeds as below.  Encourage protein and p.o. intakes.  Very mild hyponatremia, will recheck on Friday. 1-10: Hyponatremia 129, slight downtrend from 130.  Repeat BMP, serum osmol, urine studies in a.m. to confirm etiology--will be somewhat complicated by use of torsemide.  Placed on 1200 cc fluid restriction; patient is remaining well below this. -03/24/23 Na up to 132, SOsm WNL, urine studies  not done yet -03/25/23 Urine Osm still not done... scheduled weekly labs starting Monday - 1/13: Na improved/stable - repeat Wednesday--add magnesium to today's labs--2.1, stable 1-15: NA stable 131; add calcium supplementation, PRN tums, and increase protonic to 40 mg BID for at bedtime nausea 1-17: Added as needed Zofran 4 mg every 8 hours, and miscellaneous nursing order to try and divide out medications and not give all at once crushed in applesauce due to intolerance of foul taste 1-20: Only 1 use of Zofran over the weekend, no recorded emesis--still having nausea around crushed pills only  8.  Acute blood loss anemia.  Follow-up CBC -stable   - HgB improving  9.  COVID-positive.  Completed Paxlovid  -1-17: DC schedule guaifenesin.  10.  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  Seen by vascular surgery Dr Carolynn Sayers and plan outpatient follow-up 1 month  11.  History of VT peri-CABG.  Amiodarone 200 mg BID>>daily, Lanoxin 0.125 mg daily  -Remains with low/stable heart rate -1-14: Patient reporting irregular palpitations during therapies.  None documented, made PT aware so they can document any events during their therapies today.--did not occur per therapies 1/14  12.  Hyperlipidemia.  Lipitor 80mg  daily 13.  Diastolic  congestive heart failure.  Torsemide 20 mg daily, Cozaar 25 mg daily, Aldactone 25 mg daily. Dapagliflozin 10mg  daily. Monitor for any signs of fluid overload.  Daily weights. -1-8: No external signs of volume overload.  Heart failure team evaluated, appreciate recommendations and management.  Close monitoring while switching over to p.o. diuretics - lasix 40 mg daily -1/9: Significant recorded weight gain overnight, however no external signs of volume overload. I/Os -500 yesterday. Weight Likely measurement error, monitor for trend 1-10: Weight slightly down/stable.  I's and O's positive due to tube feeds, however tube feeds being discontinued today.  Monitor sodium as above. -1/11-12/25 wt stable, monitor -13: Weights down to 57.7.  KG, I's and O's positive, likely inaccurate weight.  Euvolemic appearance.  No changes at this time. 03-27-15: Weights continue to downtrend.  No orthostasis; continue current regimen -Weights generally stable, euvolemic appearance, monitor Filed Weights   04/03/23 0507 04/04/23 0500 04/04/23 0544  Weight: 49.1 kg 49.8 kg 49 kg    14.  Sacral wound.  Wound care nurse follow-up.  Covered with foam dressing -1-16: Caught himself on the back during a toileting transfer today per nursing, resulting in small wound, covered in barrier dressing.  15.  Dysphagia.  Dysphagia #2 thin liquids.  Nasogastric tube for nutritional support. Just upgraded diet- might not need Cortrak long -Continue supplemental tube feeds as above, encourage p.o. intakes, protein. -1-10: Eating 60 to 90% of meals per documentation.  Discontinue core track, tube feeds.--Continuing to eat well  16. Stool regimen: colace 200mg  daily  -1-17: DC liquid Colace, having regular bowel movements  -03/31/23 no BM in 3 days, will restart colace at 100mg  daily for now.  -04/01/23 pt states BM yesterday but none documented since 1/15, nurse documentation states LBM 1/16, unclear which is correct; asked nursing to  please document if he's had BM today, if none today then would advance bowel regimen  -1-20: Increase Colace to 100 mg twice daily, add Senokot S1 tab at night  1/21 LBM, medium  17. Thrombocytosis.  resolved   LOS: 15 days A FACE TO FACE EVALUATION WAS PERFORMED  Angelina Sheriff 04/04/2023, 8:10 AM

## 2023-04-04 NOTE — Progress Notes (Signed)
Physical Therapy Session Note  Patient Details  Name: Walter Vargas MRN: [id number] Date of Birth: [birth year]  Today's Date: 04/04/2023 PT Individual Time: 1322-1402 PT Individual Time Calculation (min): 40 min   Short Term Goals: Week 2:  PT Short Term Goal 1 (Week 2): STGs = LTGs  Skilled Therapeutic Interventions/Progress Updates: Patient supine in bed with family present on entrance to room. Patient alert and agreeable to PT session.   Patient reported fatigue and initially not wanting to perform therapy, but was willing to do so after some encouragement. Family with questions regarding pt's d/c plan and if it was still set for same date (1/25). Family and pt informed there are no other indications for pt to d/c at later date as of this moment, and to continue communication with care team regarding matter.   Therapeutic Activity: Bed Mobility: Pt performed supine<>sit on EOB with supervision (HOB elevated).  Transfers: Pt performed sit<>stand transfers throughout session with supervision. Pt self assessed positioning and anteriorly scooted to edge without VC from PTA.  Gait Training:  Pt ambulated from room<day room gym) 100'+ using RW with CGA. Pt with forward flexed posture with VC to extend at hips, and slight lean to the L in RW with VC to correct postural alignment and to depress shoulders as pt presented with increase in WB in B UE. Pt with increase in shaking in B UE when arrived to day room by hi/low mat with pt reporting symptoms of lightheadedness (family followed in [facility] and stated that pt has not presented in such a manner before). Pt vitals checked with nsg alerted to pt's presentation. Pt with seated rest break that required increase in time with pt cued to perform seated marches to increase cardiovascular circulation. Pt vitals checked again and still hypotensive with same symptomology. Pt transported back to room for pt safety and transitioned back to bed with vitals assessed  again (nsg alerted to findings). Pt with no reports in decrease in symptoms.  Patient supine in bed at end of session with brakes locked, family present, bed alarm set, and all needs within reach.      Therapy Documentation Precautions:  Precautions Precautions: Fall, Sternal, Other (comment) Precaution Booklet Issued: No Precaution Comments: cortrak, sternal precautions Restrictions Weight Bearing Restrictions Per Provider Order: No RUE Weight Bearing Per Provider Order: Weight bearing as tolerated LUE Weight Bearing Per Provider Order: Weight bearing as tolerated  Vitals: 90/60 (70) BP; 70 HR - following gait 99/47 (62) BP; 68 HR - short rest 88/51 (64) BP; 69 HR - short rest 94/49 (63) BP; 67 HR - in room   Therapy/Group: Individual Therapy  Louanne Calvillo PTA 04/04/2023, 3:31 PM

## 2023-04-04 NOTE — Plan of Care (Signed)
  Problem: Consults Goal: RH GENERAL PATIENT EDUCATION Description: See Patient Education module for education specifics. Outcome: Progressing   Problem: RH BOWEL ELIMINATION Goal: RH STG MANAGE BOWEL WITH ASSISTANCE Description: STG Manage Bowel with toileting Assistance. Outcome: Progressing Goal: RH STG MANAGE BOWEL W/MEDICATION W/ASSISTANCE Description: STG Manage Bowel with Medication with Mod I Assistance. Outcome: Progressing   Problem: RH BLADDER ELIMINATION Goal: RH STG MANAGE BLADDER WITH ASSISTANCE Description: STG Manage Bladder With toileting Assistance Outcome: Progressing   Problem: RH SKIN INTEGRITY Goal: RH STG SKIN FREE OF INFECTION/BREAKDOWN Description: Manage skin with min assist Outcome: Progressing Goal: RH STG ABLE TO PERFORM INCISION/WOUND CARE W/ASSISTANCE Description: STG Able To Perform Incision/Wound Care With min  Assistance. Outcome: Progressing   Problem: RH SAFETY Goal: RH STG ADHERE TO SAFETY PRECAUTIONS W/ASSISTANCE/DEVICE Description: STG Adhere to Safety Precautions With cues Outcome: Progressing   Problem: RH PAIN MANAGEMENT Goal: RH STG PAIN MANAGED AT OR BELOW PT'S PAIN GOAL Description: Less than 4 with prns Outcome: Progressing   Problem: RH KNOWLEDGE DEFICIT GENERAL Goal: RH STG INCREASE KNOWLEDGE OF SELF CARE AFTER HOSPITALIZATION Description: Patient will be able to take care of his meds ; diet and Manage skin care using educational resources independently  Outcome: Progressing

## 2023-04-04 NOTE — Progress Notes (Signed)
Physical Therapy Session Note  Patient Details  Name: Walter Vargas MRN: [id number] Date of Birth: 1945-12-21  Today's Date: 04/04/2023 PT Individual Time: 1002-1045 PT Individual Time Calculation (min): 43 min   Short Term Goals: Week 2:  PT Short Term Goal 1 (Week 2): STGs = LTGs  Skilled Therapeutic Interventions/Progress Updates:     Pt received semi reclined in bed and initially attempts to defer therapy due to feeling queasy from taking medicine. PT provides mod encouragement and pt is agreeable to participate. Pt does not complain of pain. Supine to sit with cues for sequencing and positioning. Pt requires significant increased time to don shoes while seated at EOB, with PT providing setup assistance. Pt attempts sit to stand several times by holding onto RW, with PT allowing pt to trials method in hopes of realizing his mistake. Eventually, pt requires cueing to utilize hands on bed to perform sit to stand safely and effectively. Pt completes with close supervision. Pt ambulates x250' with RW and close supervision, with cues for upright gaze to improve posture and balance, and increasing proximity to RW for safety. Following rest break, pt ambulates additional x150' with similar cues and assistance. Pt left seated in WC with alarm intact and all needs within reach.   Therapy Documentation Precautions:  Precautions Precautions: Fall, Sternal, Other (comment) Precaution Booklet Issued: No Precaution Comments: cortrak, sternal precautions Restrictions Weight Bearing Restrictions Per Provider Order: No RUE Weight Bearing Per Provider Order: Weight bearing as tolerated LUE Weight Bearing Per Provider Order: Weight bearing as tolerated   Therapy/Group: Individual Therapy  Beau Fanny, PT, DPT 04/04/2023, 4:15 PM

## 2023-04-04 NOTE — Plan of Care (Signed)
  Problem: RH SKIN INTEGRITY Goal: RH STG SKIN FREE OF INFECTION/BREAKDOWN Description: Manage skin with min assist Outcome: Progressing   Problem: RH SAFETY Goal: RH STG ADHERE TO SAFETY PRECAUTIONS W/ASSISTANCE/DEVICE Description: STG Adhere to Safety Precautions With cues Outcome: Progressing   Problem: RH PAIN MANAGEMENT Goal: RH STG PAIN MANAGED AT OR BELOW PT'S PAIN GOAL Description: Less than 4 with prns Outcome: Progressing   Problem: Consults Goal: RH GENERAL PATIENT EDUCATION Description: See Patient Education module for education specifics. Outcome: Not Progressing   Problem: RH BOWEL ELIMINATION Goal: RH STG MANAGE BOWEL WITH ASSISTANCE Description: STG Manage Bowel with toileting Assistance. Outcome: Not Progressing Goal: RH STG MANAGE BOWEL W/MEDICATION W/ASSISTANCE Description: STG Manage Bowel with Medication with Mod I Assistance. Outcome: Not Progressing   Problem: RH BLADDER ELIMINATION Goal: RH STG MANAGE BLADDER WITH ASSISTANCE Description: STG Manage Bladder With toileting Assistance Outcome: Not Progressing   Problem: RH SKIN INTEGRITY Goal: RH STG ABLE TO PERFORM INCISION/WOUND CARE W/ASSISTANCE Description: STG Able To Perform Incision/Wound Care With min  Assistance. Outcome: Not Progressing   Problem: RH KNOWLEDGE DEFICIT GENERAL Goal: RH STG INCREASE KNOWLEDGE OF SELF CARE AFTER HOSPITALIZATION Description: Patient will be able to take care of his meds ; diet and Manage skin care using educational resources independently  Outcome: Not Progressing

## 2023-04-04 NOTE — Progress Notes (Signed)
Occupational Therapy Session Note  Patient Details  Name: Walter Vargas MRN: [id number] Date of Birth: 09-17-1945  Today's Date: 04/04/2023 OT Individual Time: [phone] OT Individual Time Calculation (min): 56 min    Short Term Goals: Week 2:  OT Short Term Goal 1 (Week 2): Pt will complete LB dressing with min A OT Short Term Goal 2 (Week 2): Pt will complete all toileting tasks with min A overall OT Short Term Goal 3 (Week 2): Pt will carryover hand placement for transfers with no cueing 2 consecutive sessions  Skilled Therapeutic Interventions/Progress Updates:  Pt greeted supine in bed, pt agreeable to OT intervention.  Pt stated it was "Wednesday" and was able to state correct month with verbal cues.     Transfers/bed mobility/functional mobility: pt completed supine>sit with CGA. Sit>stand from EOB with CGA and MIN verbal cues for hand placement for sternal precautions. Pt completed stand pivot to w/c with Rw and CGA. Pt completed functional ambulation greater than a household distance with  RW and CGA with chair follow.   Therapeutic activity: pt completed functional ambulation task with an emphasis on working memory with pt instructed to walk ~ 10 ft to match various cards with pt instructed to memorize specific card when transitioning between the 2 tables. Pt completed task with CGA, but able to remember needed card with no cues.   Pt completed endurance task with pt instructed to step onto 4 inch step with BUE support and complete memory matching game. Pt completed stepping task with BUE support and CGA, pt with great difficulty memorizing placement of each cards. Pt needed seated rest break after ~ 3 mins of work.    ADLs:  Grooming: pt completed seated grooming tasks at sink with supervision for safety and MIN verbal cues for problem solving ADL steps.  LB dressing: pt donned pants from EOB with MIN A to initiate threading pants, MIN verbal cues to problem solve pulling up  pants vs brief  Ended session with pt supine in bed with all needs within reach and bed alarm activated.                    Therapy Documentation Precautions:  Precautions Precautions: Fall, Sternal, Other (comment) Precaution Booklet Issued: No Precaution Comments: cortrak, sternal precautions Restrictions Weight Bearing Restrictions Per Provider Order: No RUE Weight Bearing Per Provider Order: Weight bearing as tolerated LUE Weight Bearing Per Provider Order: Weight bearing as tolerated  Pain: no pain reported during session     Therapy/Group: Individual Therapy  Barron Schmid 04/04/2023, 12:25 PM

## 2023-04-04 NOTE — Progress Notes (Signed)
Patient ID: Walter Vargas, male   DOB: [birth year], 78 y.o.   MRN: [id number]  [id number] returned phone call to [name] who was inquiring about discharge appeal process. SW shared information from pre-admission screen and continued to 24/7 care that would be required, physical abilities, and cognition being most of his barriers. SW reviewed Medicare Appeals notice form and provided contact information for [facility].  SW explained still need an answer on who will come in for family edu on Friday even if appeal occurs because we need to be prepared for the decision. He will discuss with family and follow-up with SW on decision that will be made.  [name], MSW, LCSW Office: [phone] Cell: [phone] Fax: [phone]

## 2023-04-04 NOTE — Progress Notes (Signed)
Occupational Therapy Session Note  Patient Details  Name: Walter Vargas MRN: [id number] Date of Birth: August 25, 1945  Today's Date: 04/04/2023 OT Individual Time: 1100-1200 OT Individual Time Calculation (min): 60 min    Short Term Goals: Week 2:  OT Short Term Goal 1 (Week 2): Pt will complete LB dressing with min A OT Short Term Goal 2 (Week 2): Pt will complete all toileting tasks with min A overall OT Short Term Goal 3 (Week 2): Pt will carryover hand placement for transfers with no cueing 2 consecutive sessions  Skilled Therapeutic Interventions/Progress Updates:    Pt received sitting in the w/c with no c/o pain but reporting he is very fatigued and had just thrown up. Discussed medications and having food on his stomach prior to take medicine. He declined leaving the room for any activity despite encouragement. Pt completed BUE therex with 3lb dumbbell. Completed to challenge BUE strength and endurance required for maximal independence with ADLs and ADL transfers. Pt completed bicep curls and overhead press- nothing reaching out to adhere to sternal precautions. Cueing required to ensure proper form and technique for proper muscle activation. 3x6 repetitions with high fatigue between sets. Patient required increased time for initiation, cuing, rest breaks, and for completion of tasks throughout session. Utilized therapeutic use of self throughout to promote efficiency. He then completed pill box task to assess both medication management and cognitive retraining. He required max cueing to correctly place pills in their appropriate container with very poor insight into deficits, poor error recognition and poor problem solving. He will not be safe to manage his medications independently. He stood from the w/c with NO carryover of where to place hands, attempting multiple times to pull on RW. He was left supine with all needs met, bed alarm set. RN in for wound care.    Therapy  Documentation Precautions:  Precautions Precautions: Fall, Sternal, Other (comment) Precaution Booklet Issued: No Precaution Comments: cortrak, sternal precautions Restrictions Weight Bearing Restrictions Per Provider Order: No RUE Weight Bearing Per Provider Order: Weight bearing as tolerated LUE Weight Bearing Per Provider Order: Weight bearing as tolerated  Therapy/Group: Individual Therapy  [name] 04/04/2023, 11:35 AM

## 2023-04-04 NOTE — Patient Care Conference (Signed)
Inpatient RehabilitationTeam Conference and Plan of Care Update Date: 04/03/2023   Time: 1014 am    Patient Name: Walter Vargas      Medical Record Number: [id number]  Date of Birth: Feb 06, 1946 Sex: Male         Room/Bed: 4W24C/4W24C-01 Payor Info: Payor: [employer] MEDICARE / Plan: Suan Halter DUAL COMPLETE / Product Type: *No Product type* /    Admit Date/Time:  03/20/2023  4:08 PM  Primary Diagnosis:  Debility  Hospital Problems: Principal Problem:   Debility Active Problems:   S/P CABG x 5    Expected Discharge Date: Expected Discharge Date: 04/07/23  Team Members Present: Physician leading conference: Dr. Elijah Birk Social Worker Present: [name], LCSWA Nurse Present: Chana Bode, Janina Mayo, RN PT Present: Malachi Pro, PT OT Present: Jake Shark, OT SLP Present: Jeannie Done, SLP PPS Coordinator present : Fae Pippin, SLP     Current Status/Progress Goal Weekly Team Focus  Bowel/Bladder      Continent bowel; incontinent bladder at hs    Continent of B/B    Time toileting  Swallow/Nutrition/ Hydration   Dys3/thin liquid diet; continues to exhibit slowed oral preparation, thus easy to chew is most appropriate at this time. Patient prefers to remain on crushed pills in puree. Trial of whole pills in puree attempted, though patient requested to abort trials and return to crushed pills despite perceived functional tolerance of whole pills.  minA for use of strategies  continued use of strategies, upgrade as appropriate, continued tolerance    ADL's   (S) UB ADLs, min A LBs, CGA- min A transfers WITH CUEING for UE placement- will need 24/7 supervision   Supervision ADLs and transfers   ADLs, transfers, functional activity tolerance, cognition    Mobility   supervision bed mobility, supervision to minA transfers depending on sequencing, CGA to supervision gait x300'   Supervision  Family ed, DC prep, endurance, ambulation     Communication                Safety/Cognition/ Behavioral Observations  mod-max assist overall for basic tasks, he exhibits mild improvements but overall participation has significantly reduced at previous 3 sessions with 2 refusals, limiting potential to make further progress.   mod assist   attention, carryover, participation, memory notebook use and consistency, intellectual and anticipatory awareness, orientation with use of external aids    Pain      n/a          Skin      Buttocks - MASD   Free from MASD    Assess skin q shift and barrier cream with incontinent episode    Discharge Planning:  Pt needs to discharge to home Mod I as there is limited support at discharge. SW waiting on updates on who will come to be with pt for the first 1-2 weeks at discharge. SW emailed pt son Medicaid application. HHPT/OT/SLP/aide with [facility]. SW will confirm there are no barriers to discharge.   Team Discussion: Patient admitted with  cardiac debility post cardiogenic shock with anxiety.  Patient on target to meet rehab goals: yes, currently, heart failure controlled diuresing well. Sutures are out on his leg incisions and chest incisions are now open to air.   *See Care Plan and progress notes for long and short-term goals.   Revisions to Treatment Plan:  Atarax added at hs   Teaching Needs: Medications, safety, transfers , skin care, toileting, etc  Current Barriers to Discharge: Decreased  family support   Possible Resolutions to Barriers: Family education DME : [facility] Medicaid application pending Home health follow up     Medical Summary Current Status: medically complicated by memory/cognition, heart failure, anxiety, and wound care  Barriers to Discharge: Behavior/Mood;Cardiac Complications;Complicated Wound;Electrolyte abnormality;Inadequate Nutritional Intake;Medical stability;Self-care education;Infection/IV Antibiotics   Possible Resolutions to [employer]: treat anxiety and sleep disruption, daily weight and regular monitoring of volume status and labs to ensure no worsening heart failure, treatment and monitoring of wounds on right leg for improvement, encouraging p.o. intakes, frequent orientation   Continued Need for Acute Rehabilitation Level of Care: The patient requires daily medical management by a physician with specialized training in physical medicine and rehabilitation for the following reasons: Direction of a multidisciplinary physical rehabilitation program to maximize functional independence : Yes Medical management of patient stability for increased activity during participation in an intensive rehabilitation regime.: Yes Analysis of laboratory values and/or radiology reports with any subsequent need for medication adjustment and/or medical intervention. : Yes   I attest that I was present, lead the team conference, and concur with the assessment and plan of the team.   Gwenyth Allegra 04/04/2023, 8:46 AM

## 2023-04-05 NOTE — Progress Notes (Signed)
Patient ID: Walter Vargas, male   DOB: 1945/05/24, 78 y.o.   MRN: [id number]  SW received phone call frompt son Walter Vargas inquiring about options aside from appeal and pt being placed somewhere. SW discussed in length differences between SNF, ALF, ILF, and if APS has to be involved. SW shared SNF placement process in length. SW reiterated family will need to apply for LTC Medicaid for pt. SW shared if pt is not accepted to SNF, then they will be required to take him home. SW will email SNF list based on insurance and informed to indicate preference.   1420-SW returned phone call to family friend [name] to discuss care needs. SW shared above conversation with [name]. Reports that she is willing to support wherever help is needed.   SW informed [facility] on possible SNF placement.   SW sent out SNF referral.  SW emailed SNF list based on insurance to pt son.   [name], MSW, LCSW Office: [phone] Cell: [phone] Fax: [phone]

## 2023-04-05 NOTE — Progress Notes (Signed)
Occupational Therapy Session Note  Patient Details  Name: KOHLTON BURGERS MRN: [id number] Date of Birth: [birth year]  Today's Date: 04/05/2023 OT Missed Time: 60 Minutes Missed Time Reason: Patient unwilling/refused to participate without medical reason;Pain (Pt declinging group OT session d/t pain)   Short Term Goals: Week 2:  OT Short Term Goal 1 (Week 2): Pt will complete LB dressing with min A OT Short Term Goal 2 (Week 2): Pt will complete all toileting tasks with min A overall OT Short Term Goal 3 (Week 2): Pt will carryover hand placement for transfers with no cueing 2 consecutive sessions  Skilled Therapeutic Interventions/Progress Updates:     Pt declined skilled OT session d/t fatigue/pain, perseverating on seeing RN prior to participating in skilled OT session. Provided maximal therapeutic support and motivation, however Pt continued to decline. Missed 60 minutes skilled OT treatment. Will attempt to make up time as schedule and Pt's status allow.   Therapy Documentation Precautions:  Precautions Precautions: Fall, Sternal, Other (comment) Precaution Booklet Issued: No Precaution Comments: cortrak, sternal precautions Restrictions Weight Bearing Restrictions Per Provider Order: No RUE Weight Bearing Per Provider Order: Weight bearing as tolerated LUE Weight Bearing Per Provider Order: Weight bearing as tolerated   Therapy/Group: Individual Therapy  Clide Deutscher 04/05/2023, 3:47 PM

## 2023-04-05 NOTE — Progress Notes (Signed)
Physical Therapy Session Note  Patient Details  Name: Walter Vargas MRN: [id number] Date of Birth: January 16, 1946  Today's Date: 04/05/2023 PT Individual Time: 4098-1191 PT Time: 70 mins  Short Term Goals: Week 2:  PT Short Term Goal 1 (Week 2): STGs = LTGs  Skilled Therapeutic Interventions/Progress Updates:     Pt received supine in bed asleep. Easily awakens to verbal and tactile cues and agrees to therapy. No complaint of pain but additional time required to wake up. Supine to sit with verbal cues for use of bed features and positioning at EOB. Pt requires light minA to stand from bed due to slight posterior bias. Pt ambulates x150' to gym with RW and cues to increase proximity to RW, and maintain upright posture to improve balance and body mechanics. Seated rest break. Pt ambulates x350' with RW and similar cues. Pt takes seated rest break. Pt attempts 6 minute walk test after explanation of performance. Pt ambulates x350' with RW in 3:20 prior to requiring seated rest break, ending test. Pt requires extended seated rest break. Pt ambulates x30' to Nustep with RW and same cues. Pt completes Nustep for endurance training. Pt completes at workload of 4 with average steps per minute ~40. Pt completes x10:00 with cues for hand and foot placement and completing full available ROM. Pt requires several seated rest breaks during activity. Pt ambulates back to room. Left seated in WC with alarm intact and all needs within reach.  Therapy Documentation Precautions:  Precautions Precautions: Fall, Sternal, Other (comment) Precaution Booklet Issued: No Precaution Comments: cortrak, sternal precautions Restrictions Weight Bearing Restrictions Per Provider Order: No RUE Weight Bearing Per Provider Order: Weight bearing as tolerated LUE Weight Bearing Per Provider Order: Weight bearing as tolerated   Therapy/Group: Individual Therapy  Beau Fanny, PT, DPT 04/05/2023, 3:37 PM

## 2023-04-05 NOTE — Progress Notes (Signed)
PROGRESS NOTE   Subjective/Complaints: No events overnight.  Patient complaining of feeling that he is discharging too early.  Asking for an extension.  When inquiring why he thinks an extension would be beneficial, patient cannot answer this question.  Reiterated that primary barriers to independence at this point are more cognitive then functional, and that these would not be expected to improve with a 1 week extended length of stay.  Patient expresses understanding of this decision.  No other complaints, concerns at this time.   Stable vitals, LBM 1/21  ROS: Denies fevers, chills,  abd pain, vomiting, diarrhea, SOB, cough, chest pain, new weakness or paraesthesias.   + Nausea w/ pills crushed in food--ongoing +anxiety--ongoing + Right lower extremity dry skin-ongoing  Objective:   No results found.   No results for input(s): "WBC", "HGB", "HCT", "PLT" in the last 72 hours.    Recent Labs    04/03/23 0516  NA 133*  K 5.0  CL 93*  CO2 29  GLUCOSE 79  BUN 35*  CREATININE 1.08  CALCIUM 9.6     Intake/Output Summary (Last 24 hours) at 04/05/2023 0948 Last data filed at 04/05/2023 3244 Gross per 24 hour  Intake 320 ml  Output 300 ml  Net 20 ml     Pressure Injury 03/15/23 Coccyx Mid;Lower Stage 2 -  Partial thickness loss of dermis presenting as a shallow open injury with a red, pink wound bed without slough. (Active)  03/15/23 0800  Location: Coccyx  Location Orientation: Mid;Lower  Staging: Stage 2 -  Partial thickness loss of dermis presenting as a shallow open injury with a red, pink wound bed without slough.  Wound Description (Comments):   Present on Admission: Yes    Physical Exam: Vital Signs Blood pressure 116/67, pulse (!) 56, temperature 98.1 F (36.7 C), resp. rate 19, height 5\' 8"  (1.727 m), weight 48.6 kg, SpO2 100%.   Constitution: Appropriate appearance for age. No apparent distress.   Ambulating with PT. Resp: Trace, fine crackles in left lower lobe.  Otherwise, clear to auscultation bilaterally.  On room air, no respiratory distress. Cardio: Regular rate, reg rhythm. Well perfused appearance.  Chronic bilateral lower extremity skin changes. Abdomen: soft, Nondistended. Nontender.  +BS throughout,  Psych: Appropriate mood and affect.   Skin:   + Right inner thigh/calf with scabbing from vein harvest site-now with some dry, cracked appearing skin around it.  Cellulitis resolved.--Unchanged  MSK: Severe lumbar levoscoliosis.  Neurologic Exam:  Awake, alert, and oriented to self, place; not time. + Moderate memory and higher cognitive deficits  - ongoing  Follows all simple commands. Cranial nerves II through XII intact.  Sensation intact.  No fine motor deficits.  No ataxia. Insight: Fair insight into current condition Strength: Antigravity against resistance all 4 extremities grossly 5- out of 5, unchanged 1-23 Ambulating at supervision level with walker, forward flexed positioning.  Good stance, stride, and clearance.    Assessment/Plan: 1. Functional deficits which require 3+ hours per day of interdisciplinary therapy in a comprehensive inpatient rehab setting. Physiatrist is providing close team supervision and 24 hour management of active medical problems listed below. Physiatrist and rehab team continue to  assess barriers to discharge/monitor patient progress toward functional and medical goals  Care Tool:  Bathing    Body parts bathed by patient: Right arm, Left arm, Chest, Abdomen, Front perineal area, Buttocks, Right upper leg, Left upper leg, Face, Right lower leg, Left lower leg   Body parts bathed by helper: Right lower leg, Left lower leg     Bathing assist Assist Level: Contact Guard/Touching assist     Upper Body Dressing/Undressing Upper body dressing   What is the patient wearing?: Pull over shirt    Upper body assist Assist Level: Contact  Guard/Touching assist    Lower Body Dressing/Undressing Lower body dressing      What is the patient wearing?: Pants, Underwear/pull up     Lower body assist Assist for lower body dressing: Minimal Assistance - Patient > 75%     Toileting Toileting    Toileting assist Assist for toileting: Minimal Assistance - Patient > 75%     Transfers Chair/bed transfer  Transfers assist     Chair/bed transfer assist level: Minimal Assistance - Patient > 75%     Locomotion Ambulation   Ambulation assist      Assist level: 2 helpers Assistive device: No Device Max distance: 5'   Walk 10 feet activity   Assist  Walk 10 feet activity did not occur: Safety/medical concerns  Assist level:  (able to ambulate with RW, but at basline did not use an AD)     Walk 50 feet activity   Assist Walk 50 feet with 2 turns activity did not occur: Safety/medical concerns         Walk 150 feet activity   Assist Walk 150 feet activity did not occur: Safety/medical concerns         Walk 10 feet on uneven surface  activity   Assist Walk 10 feet on uneven surfaces activity did not occur: Safety/medical concerns         Wheelchair     Assist Is the patient using a wheelchair?: Yes Type of Wheelchair: Manual Wheelchair activity did not occur:  (Sternal precautions)  Wheelchair assist level: Dependent - Patient 0% Max wheelchair distance: 150'    Wheelchair 50 feet with 2 turns activity    Assist    Wheelchair 50 feet with 2 turns activity did not occur: Safety/medical concerns       Wheelchair 150 feet activity     Assist  Wheelchair 150 feet activity did not occur: Safety/medical concerns       Blood pressure 116/67, pulse (!) 56, temperature 98.1 F (36.7 C), resp. rate 19, height 5\' 8"  (1.727 m), weight 48.6 kg, SpO2 100%.  Medical Problem List and Plan: 1. 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  Sternal precautions             -patient may shower -ELOS/Goals: 14-18 days supervision to mod I-- With supervision goals DC 1/25 supervision/goals  - 1/23: Per patient/family request, pursuing SNF placement due to inability of caregivers to provide 24/7 support with ongoing min assist ADLs and cognitive deficits requiring assistance   -Stable to continue CIR 1/14: Needs to be independent at discharge per SW; son has moved out of state and can maybe provide 1-2 weeks on discharge. Patient's son's GF has provided some support in the hospital. Carryover very poor; Max-total assist with sternal precautions, Min A if able to push up. Difficulty with LBD and toiletting tasks - will  forget what he is doing with toileting tasks. Per PT has been needing lots of help at home too. Per SW presenting like dementia cog-wise. Doing well with Dys 3. Will need assistance at discharge; SW to discuss with family.  1-16: Discussed patient dispo requirements with son [name], emphasizing need for full supervision at discharge and anticipating will be greater than a 1 to 2 months needed.  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  After discussion with social work, family still unsure who will come in for training, will likely provide supervision for 2 weeks at home prior to deciding whether patient needs more oversight. 1/21: At bedtime incontinence per nursing. Per SW family not agreeing to care plan involving [name]/her mother, which was offered; this is consistent with patient's wishes as well. Unsure of dispo plan at this time as family is not offering alternatives.  1-22: Family discussed appealing discharge with social work due to feeling patient is not medically optimized.  Per team, primary barriers remain cognitive, not expected to improve  further with additional time.  2.  Antithrombotics: -DVT/anticoagulation:  Pharmaceutical: Lovenox 40mg  daily.  Venous Doppler studies negative -antiplatelet therapy: Aspirin 81 mg daily.  3. Pain Management: Oxycodone as needed--no complaints  4. Mood/Behavior/Sleep: Provide emotional support             - antipsychotic agents: Seroquel 25 mg daily at 1000 and 50 mg nightly   - Continue current regimen, can add melatonin 5 mg as needed  -1-13: Will start to wean off antipsychotics; DC AM Seroquel for tomorrow 1-15: Patient tolerated well, change nightly Seroquel to as needed--has resulted in somewhat interrupted sleep but so far has been tolerable.  Noel Christmas states he gets anxious at night sometimes regarding dispo issues. 1/17: No PRNs used.  Continue melatonin, will add nightly as needed trazodone 50 mg retime at bedtime medications to 8 PM given this is his normal bedtime--improved 1-20: Family reporting compulsive calls, anxieties in the early evenings between therapies and sleep.  Patient is sleeping well, no hallucinations or signs of delirium at this time.  Likely early sundowning, adding Atarax for anxiety and informed nursing to be aware of these behaviors. 04-03-21: No reports by nursing overnight, no use of as needed Atarax   5. Neuropsych/cognition: This patient is capable of making decisions on his own behalf.  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  Weaning sedating medications as above.  Will start Aricept 5 mg nightly-theoretical risk of arrhythmia, however on literature review cardiac events matched placebo on meta-analysis. 1-15: Tolerating Aricept well--and noticing some mild cognitive improvements 1/21: Per cognitive evaluation, patient can make reasonable decisions for himself although underlying dementia requires some reorientation and cueing  6. Skin/Wound Care:  Routine skin checks. -His right lower extremity harvest site completing course of Ancef for cellulitis --1/8: transitioned to Augmentin 1-8 to complete 10-day course--appearance slightly more edematous/erythematous 1-14, will get WBC in a.m. and monitor closely, still staying within margins of prior marking so may just be taking a long time to resolve  1-13: DC PICC line--site looks clean. -1-17: Right clavicle staples removed.  Remove abdominal sutures today. - 1/21: RLE swelling/infection resolved 1-22: Dry skin overlying prior infection site; add Eucerin daily  7. Fluids/Electrolytes/Nutrition/hyponatremia: Routine in and outs with follow-up chemistries, continue supplements.  -1-8: Low albumin,  protein; continue current supplemental tube feeds as below.  Encourage protein and p.o. intakes.  Very mild hyponatremia, will recheck on Friday. 1-10: Hyponatremia 129, slight downtrend from 130.  Repeat BMP, serum osmol, urine studies in a.m. to confirm etiology--will be somewhat complicated by use of torsemide.  Placed on 1200 cc fluid restriction; patient is remaining well below this. -03/24/23 Na up to 132, SOsm WNL, urine studies not done yet -03/25/23 Urine Osm still not done... scheduled weekly labs starting Monday - 1/13: Na improved/stable - repeat Wednesday--add magnesium to today's labs--2.1, stable 1-15: NA stable 131; add calcium supplementation, PRN tums, and increase protonic to 40 mg BID for at bedtime nausea 1-17: Added as needed Zofran 4 mg every 8 hours, and miscellaneous nursing order to try and divide out medications and not give all at once crushed in applesauce due to intolerance of foul taste 1-20: Only 1 use of Zofran over the weekend, no recorded emesis --still having nausea around crushed pills only  8.  Acute blood loss anemia.  Follow-up CBC -stable   - HgB improving  9.  COVID-positive.  Completed Paxlovid  -1-17: DC schedule guaifenesin.  10.  Carotid artery  stenosis.  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  Seen by vascular surgery Dr Carolynn Sayers and plan outpatient follow-up 1 month  11.  History of VT peri-CABG.  Amiodarone 200 mg BID>>daily, Lanoxin 0.125 mg daily  -Remains with low/stable heart rate -1-14: Patient reporting irregular palpitations during therapies.  None documented, made PT aware so they can document any events during their therapies today.--did not occur per therapies 1/14  12.  Hyperlipidemia.  Lipitor 80mg  daily 13.  Diastolic congestive heart failure.  Torsemide 20 mg daily, Cozaar 25 mg daily, Aldactone 25 mg daily. Dapagliflozin 10mg  daily. Monitor for any signs of fluid overload.  Daily weights. -1-8: No external signs of volume overload.  Heart failure team evaluated, appreciate recommendations and management.  Close monitoring while switching over to p.o. diuretics - lasix 40 mg daily -1/9: Significant recorded weight gain overnight, however no external signs of volume overload. I/Os -500 yesterday. Weight Likely measurement error, monitor for trend 1-10: Weight slightly down/stable.  I's and O's positive due to tube feeds, however tube feeds being discontinued today.  Monitor sodium as above. -1/11-12/25 wt stable, monitor -13: Weights down to 57.7.  KG, I's and O's positive, likely inaccurate weight.  Euvolemic appearance.  No changes at this time. 03-27-15: Weights continue to downtrend.  No orthostasis; continue current regimen -Weights continue to downtrend, eating 50 to 100% of meals, euvolemic appearance, monitor Filed Weights   04/04/23 0500 04/04/23 0544 04/05/23 0522  Weight: 49.8 kg 49 kg 48.6 kg    14.  Sacral wound.  Wound care nurse follow-up.  Covered with foam dressing -1-16: Caught himself on the back during a toileting transfer today per nursing, resulting in small wound, covered in barrier dressing.  15.  Dysphagia.  Dysphagia #2 thin liquids.  Nasogastric tube for nutritional support.  Just upgraded diet- might not need Cortrak long -Continue supplemental tube feeds as above, encourage p.o. intakes, protein. -1-10: Eating 60 to 90% of meals per documentation.  Discontinue core track, tube feeds.--Continuing to eat well  16. Stool regimen: colace 200mg  daily  -1-17: DC liquid Colace, having regular bowel movements  -03/31/23 no BM in 3 days, will restart colace at 100mg  daily for now.  -04/01/23 pt states BM yesterday but none documented since 1/15, nurse documentation states  LBM 1/16, unclear which is correct; asked nursing to please document if he's had BM today, if none today then would advance bowel regimen  -1-20: Increase Colace to 100 mg twice daily, add Senokot S1 tab at night  1/21 LBM, medium  17. Thrombocytosis.  resolved   LOS: 16 days A FACE TO FACE EVALUATION WAS PERFORMED  Angelina Sheriff 04/05/2023, 9:48 AM

## 2023-04-05 NOTE — Plan of Care (Signed)
  Problem: Consults Goal: RH GENERAL PATIENT EDUCATION Description: See Patient Education module for education specifics. Outcome: Progressing   Problem: RH BOWEL ELIMINATION Goal: RH STG MANAGE BOWEL WITH ASSISTANCE Description: STG Manage Bowel with toileting Assistance. Outcome: Progressing Goal: RH STG MANAGE BOWEL W/MEDICATION W/ASSISTANCE Description: STG Manage Bowel with Medication with Mod I Assistance. Outcome: Progressing   Problem: RH BLADDER ELIMINATION Goal: RH STG MANAGE BLADDER WITH ASSISTANCE Description: STG Manage Bladder With toileting Assistance Outcome: Progressing   Problem: RH SKIN INTEGRITY Goal: RH STG SKIN FREE OF INFECTION/BREAKDOWN Description: Manage skin with min assist Outcome: Progressing Goal: RH STG ABLE TO PERFORM INCISION/WOUND CARE W/ASSISTANCE Description: STG Able To Perform Incision/Wound Care With min  Assistance. Outcome: Progressing   Problem: RH SAFETY Goal: RH STG ADHERE TO SAFETY PRECAUTIONS W/ASSISTANCE/DEVICE Description: STG Adhere to Safety Precautions With cues Outcome: Progressing   Problem: RH PAIN MANAGEMENT Goal: RH STG PAIN MANAGED AT OR BELOW PT'S PAIN GOAL Description: Less than 4 with prns Outcome: Progressing   Problem: RH KNOWLEDGE DEFICIT GENERAL Goal: RH STG INCREASE KNOWLEDGE OF SELF CARE AFTER HOSPITALIZATION Description: Patient will be able to take care of his meds ; diet and Manage skin care using educational resources independently  Outcome: Progressing

## 2023-04-05 NOTE — Progress Notes (Signed)
Occupational Therapy Session Note  Patient Details  Name: Walter Vargas MRN: [id number] Date of Birth: [birth year]  Today's Date: 04/05/2023 OT Individual Time: 0802-0900 OT Individual Time Calculation (min): 58 min    Short Term Goals: Week 2:  OT Short Term Goal 1 (Week 2): Pt will complete LB dressing with min A OT Short Term Goal 2 (Week 2): Pt will complete all toileting tasks with min A overall OT Short Term Goal 3 (Week 2): Pt will carryover hand placement for transfers with no cueing 2 consecutive sessions  Skilled Therapeutic Interventions/Progress Updates:   Pt seen for skilled OT session with focus on safety, energy conservation, activity tolerance and balance for ADL's. completing toileting with NT upon OT arrival. Amb with RW for sinkside AM self care including sponge bathing, pull on scrubs dressing, standing level grooming for oral care and seated and standing level shaving. OT provided set up and min verbal cues for sit to stand hand placement due to sternal precautions. Stood 2 min for oral care x 2 sets with brief seated rest. Pt has been reluctant again for shaving despite full facial hair growth with coarse hair. Pt deferred and then suggested we attempt with electric trimmer brought in. Stood for face washing and warm compresses to face to loosen hair. Seated trimmer use with min A intermittently with pt then OT takeover due to pt reporting fatigue with tedious task. Stood 1 final time for 3 minutes with seated rest.  Used memory log book to recall tasks completed with min cues. Left pt with chair alarm set, needs and nurse call button in reach.    Pain: denies this visit   Therapy Documentation Precautions:  Precautions Precautions: Fall, Sternal, Other (comment) Precaution Booklet Issued: No Precaution Comments: cortrak, sternal precautions Restrictions Weight Bearing Restrictions Per Provider Order: No RUE Weight Bearing Per Provider Order: Weight bearing as  tolerated LUE Weight Bearing Per Provider Order: Weight bearing as tolerated   Therapy/Group: Individual Therapy  Vicenta Dunning 04/05/2023, 7:47 AM

## 2023-04-05 NOTE — NC FL2 (Signed)
[place] MEDICAID FL2 LEVEL OF CARE FORM     IDENTIFICATION  Patient Name: Walter Vargas Birthdate: 1945/05/13 Sex: male Admission Date (Current Location): 03/20/2023  [facility] and [place] Number:  Producer, television/film/video and Address:  [facility], 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) 04/04/2023   Pressure injury of skin 03/20/2023   Debility 03/20/2023   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   Postoperative shock, cardiogenic (HCC) 03/09/2023   Protein-calorie malnutrition, severe 03/03/2023   Cardiogenic shock (HCC) 03/01/2023   COVID-19 virus infection 02/18/2023   Heart failure (HCC) 02/14/2023   AKI (acute kidney injury) (HCC) 02/13/2023   Acute exacerbation of CHF (congestive heart failure) (HCC) 02/10/2023   Non-ST elevation (NSTEMI) myocardial infarction (HCC) 02/10/2023   Thickening of wall of gallbladder 02/10/2023   Elevated LFTs 02/10/2023   Chronic diastolic congestive heart failure (HCC) 02/05/2023   Pleural effusion 10/19/2022   Elevated brain natriuretic peptide (BNP) level 10/19/2022   Dyspnea on exertion 10/12/2022   Lower extremity edema 10/12/2022   Elevated blood pressure reading 06/21/2022   Callus 06/21/2022   Umbilical hernia without obstruction and without gangrene 06/21/2022   Impacted cerumen of right ear 05/26/2021   Chronic pain of both knees 05/26/2021   Preventative health care  05/26/2021   Screening for colon cancer 05/26/2021    Orientation RESPIRATION BLADDER Height & Weight     Self, Situation, Place, Time  Normal Continent Weight: 107 lb 2.3 oz (48.6 kg) Height:  5\' 8"  (172.7 cm)  BEHAVIORAL SYMPTOMS/MOOD NEUROLOGICAL BOWEL NUTRITION STATUS      Continent Diet  AMBULATORY STATUS COMMUNICATION OF NEEDS Skin   Supervision Verbally Normal                       Personal Care Assistance Level of Assistance  Bathing, Dressing Bathing Assistance: Limited assistance   Dressing Assistance: Limited assistance     Functional Limitations Info  Sight, Hearing, Speech Sight Info: Adequate Hearing Info: Adequate Speech Info: Adequate    SPECIAL CARE FACTORS FREQUENCY  PT (By licensed PT), Speech therapy, OT (By licensed OT)     PT Frequency: 5xs per week OT Frequency: 5xs per week     Speech Therapy Frequency: 5xs per week      Contractures Contractures Info: Not present    Additional Factors Info  Code Status, Allergies Code Status Info: Full Allergies Info: See discharge instructions           Current Medications (04/05/2023):  This is the current hospital active medication list Current Facility-Administered Medications  Medication Dose Route Frequency Provider Last Rate Last Admin   acetaminophen (TYLENOL) 160 MG/5ML solution 650 mg  650 mg Oral Q4H PRN Angiulli, Mcarthur Rossetti, PA-C       amiodarone (PACERONE) tablet 200 mg  200 mg Oral Daily Romie Minus, MD   200 mg at 04/04/23 8413   ascorbic acid (  VITAMIN C) tablet 500 mg  500 mg Oral Daily Charlton Amor, PA-C   500 mg at 04/04/23 5784   aspirin chewable tablet 81 mg  81 mg Oral Daily Charlton Amor, PA-C   81 mg at 04/04/23 6962   atorvastatin (LIPITOR) tablet 80 mg  80 mg Oral Daily Charlton Amor, PA-C   80 mg at 04/04/23 9528   calcium carbonate (TUMS - dosed in mg elemental calcium) chewable tablet 200 mg of elemental calcium  1 tablet Oral TID PRN Angelina Sheriff, DO       dapagliflozin propanediol (FARXIGA) tablet 10 mg  10 mg Oral Daily Charlton Amor, PA-C   10 mg at 04/04/23 4132   digoxin (LANOXIN) tablet 0.125 mg  0.125 mg Oral Daily Charlton Amor, PA-C   0.125 mg at 04/04/23 1006   docusate (COLACE) 50 MG/5ML liquid 100 mg  100 mg Oral BID Elijah Birk C, DO   100 mg at 04/04/23 4401   donepezil (ARICEPT) tablet 5 mg  5 mg Oral QHS Elijah Birk C, DO   5 mg at 04/04/23 2105   enoxaparin (LOVENOX) injection 40 mg  40 mg Subcutaneous Daily Elijah Birk C, DO   40 mg at 04/05/23 0944   famotidine (PEPCID) tablet 20 mg  20 mg Oral BID Elijah Birk C, DO   20 mg at 04/04/23 2105   feeding supplement (ENSURE ENLIVE / ENSURE PLUS) liquid 237 mL  237 mL Oral BID BM Elijah Birk C, DO   237 mL at 04/05/23 0272   Gerhardt's butt cream   Topical Daily Charlton Amor, PA-C   Given at 04/05/23 5366   hydrocerin (EUCERIN) cream   Topical Daily Angelina Sheriff, DO       hydrOXYzine (ATARAX) tablet 25 mg  25 mg Oral TID PRN Angelina Sheriff, DO       losartan (COZAAR) tablet 25 mg  25 mg Oral Daily Charlton Amor, PA-C   25 mg at 04/04/23 4403   melatonin tablet 5 mg  5 mg Oral QHS Elijah Birk C, DO   5 mg at 04/04/23 2105   multivitamin with minerals tablet 1 tablet  1 tablet Oral Daily Elijah Birk C, DO   1 tablet at 04/04/23 4742   ondansetron (ZOFRAN) tablet 4 mg  4 mg Oral Q8H PRN Elijah Birk C, DO   4 mg at 04/04/23 5956   Oral care mouth rinse  15 mL Mouth Rinse 4 times per day Charlton Amor, PA-C   15 mL at 04/05/23 0915   Oral care mouth rinse  15 mL Mouth Rinse PRN Angiulli, Mcarthur Rossetti, PA-C       oxyCODONE (Oxy IR/ROXICODONE) immediate release tablet 5 mg  5 mg Oral Q4H PRN Rexford Maus, RPH       senna-docusate (Senokot-S) tablet 1 tablet  1 tablet Oral QHS Elijah Birk C, DO   1 tablet at 04/02/23 2058   sodium chloride (OCEAN) 0.65 % nasal spray 1 spray  1 spray Each Nare PRN Angiulli, Mcarthur Rossetti, PA-C        spironolactone (ALDACTONE) tablet 25 mg  25 mg Oral Daily Charlton Amor, PA-C   25 mg at 04/04/23 3875   thiamine (VITAMIN B1) tablet 100 mg  100 mg Oral Daily Charlton Amor, PA-C   100 mg at 04/04/23 6433   torsemide (DEMADEX) tablet 20 mg  20 mg Oral Daily Romie Minus,  MD   20 mg at 04/04/23 1610   traZODone (DESYREL) tablet 50 mg  50 mg Oral QHS PRN Angelina Sheriff, DO   50 mg at 04/04/23 2105     Discharge Medications: Please see discharge summary for a list of discharge medications.  Relevant Imaging Results:  Relevant Lab Results:   Additional Information [id number]  Gretchen Short, LCSW

## 2023-04-06 MED ORDER — BUSPIRONE HCL 10 MG PO TABS
5.0000 mg | ORAL_TABLET | Freq: Two times a day (BID) | ORAL | Status: DC
Start: 2023-04-06 — End: 2023-04-11
  Administered 2023-04-06 – 2023-04-10 (×8): 5 mg via ORAL
  Filled 2023-04-06 (×9): qty 1

## 2023-04-06 MED ORDER — TORSEMIDE 20 MG PO TABS
10.0000 mg | ORAL_TABLET | Freq: Every day | ORAL | Status: DC
  Administered 2023-04-07 – 2023-04-08 (×2): 10 mg via ORAL
  Filled 2023-04-06 (×2): qty 1

## 2023-04-06 NOTE — Progress Notes (Signed)
PROGRESS NOTE   Subjective/Complaints: No events overnight.  Patient's friend [name] and her mother at bedside; patient endorsing considerable anxiety about placement and not knowing what comes next.  Per [name] and her mom, had another episode of anxiety and confusion this morning, ongoing severe anxiety in the evenings with repeatedly calling them over and over.  Also, reported incident with therapy a few days ago where the patient got very dizzy and lightheaded, started shaking, and had very low blood pressure.  Reports that this self resolved.  Had not been reported to this provider.   ROS: Denies fevers, chills,  abd pain, vomiting, diarrhea, SOB, cough, chest pain, new weakness or paraesthesias.   + Nausea w/ pills crushed in food--ongoing, slightly improved + anxiety--ongoing, worsening + Right lower extremity dry skin-stable  Objective:   No results found.   No results for input(s): "WBC", "HGB", "HCT", "PLT" in the last 72 hours.    No results for input(s): "NA", "K", "CL", "CO2", "GLUCOSE", "BUN", "CREATININE", "CALCIUM" in the last 72 hours.    Intake/Output Summary (Last 24 hours) at 04/06/2023 1700 Last data filed at 04/06/2023 9518 Gross per 24 hour  Intake 240 ml  Output 300 ml  Net -60 ml     Pressure Injury 03/15/23 Coccyx Mid;Lower Stage 2 -  Partial thickness loss of dermis presenting as a shallow open injury with a red, pink wound bed without slough. (Active)  03/15/23 0800  Location: Coccyx  Location Orientation: Mid;Lower  Staging: Stage 2 -  Partial thickness loss of dermis presenting as a shallow open injury with a red, pink wound bed without slough.  Wound Description (Comments):   Present on Admission: Yes    Physical Exam: Vital Signs Blood pressure (!) 100/52, pulse 62, temperature (!) 97.4 F (36.3 C), temperature source Oral, resp. rate 16, height 5\' 8"  (1.727 m), weight (P) 48.6 kg,  SpO2 100%.   Constitution: Appropriate appearance for age. No apparent distress.  Laying in bed. Resp:   clear to auscultation bilaterally.  On room air, no respiratory distress. Cardio: Regular rate, reg rhythm. Well perfused appearance.  Chronic bilateral lower extremity skin changes.No edema.  Abdomen: soft, Nondistended. Nontender.  +BS throughout,  Psych: Appropriate mood and affect.  Mildly anxious. Skin:   + Right inner thigh/calf with scabbing from vein harvest site-now with some dry, cracked appearing skin around it. --Improving  MSK: Severe lumbar levoscoliosis.  Neurologic Exam:  Awake, alert, and oriented to self, place, year, and month; not date + Moderate memory and higher cognitive deficits  - ongoing  Follows all simple commands. Cranial nerves II through XII intact.  Sensation intact.  No fine motor deficits.  No ataxia. Insight: Fair insight into current condition Strength: Antigravity against resistance all 4 extremities grossly 5- out of 5 throughout   Assessment/Plan: 1. Functional deficits which require 3+ hours per day of interdisciplinary therapy in a comprehensive inpatient rehab setting. Physiatrist is providing close team supervision and 24 hour management of active medical problems listed below. Physiatrist and rehab team continue to assess barriers to discharge/monitor patient progress toward functional and medical goals  Care Tool:  Bathing    Body parts  bathed by patient: Right arm, Left arm, Chest, Abdomen, Front perineal area, Buttocks, Right upper leg, Left upper leg, Face, Right lower leg, Left lower leg   Body parts bathed by helper: Right lower leg, Left lower leg     Bathing assist Assist Level: Contact Guard/Touching assist     Upper Body Dressing/Undressing Upper body dressing   What is the patient wearing?: Pull over shirt    Upper body assist Assist Level: Contact Guard/Touching assist    Lower Body Dressing/Undressing Lower body  dressing      What is the patient wearing?: Pants, Underwear/pull up     Lower body assist Assist for lower body dressing: Minimal Assistance - Patient > 75%     Toileting Toileting    Toileting assist Assist for toileting: Moderate Assistance - Patient 50 - 74%     Transfers Chair/bed transfer  Transfers assist     Chair/bed transfer assist level: Minimal Assistance - Patient > 75%     Locomotion Ambulation   Ambulation assist      Assist level: 2 helpers Assistive device: No Device Max distance: 5'   Walk 10 feet activity   Assist  Walk 10 feet activity did not occur: Safety/medical concerns  Assist level:  (able to ambulate with RW, but at basline did not use an AD)     Walk 50 feet activity   Assist Walk 50 feet with 2 turns activity did not occur: Safety/medical concerns         Walk 150 feet activity   Assist Walk 150 feet activity did not occur: Safety/medical concerns         Walk 10 feet on uneven surface  activity   Assist Walk 10 feet on uneven surfaces activity did not occur: Safety/medical concerns         Wheelchair     Assist Is the patient using a wheelchair?: Yes Type of Wheelchair: Manual Wheelchair activity did not occur:  (Sternal precautions)  Wheelchair assist level: Dependent - Patient 0% Max wheelchair distance: 150'    Wheelchair 50 feet with 2 turns activity    Assist    Wheelchair 50 feet with 2 turns activity did not occur: Safety/medical concerns       Wheelchair 150 feet activity     Assist  Wheelchair 150 feet activity did not occur: Safety/medical concerns       Blood pressure (!) 100/52, pulse 62, temperature (!) 97.4 F (36.3 C), temperature source Oral, resp. rate 16, height 5\' 8"  (1.727 m), weight (P) 48.6 kg, SpO2 100%.  Medical Problem List and Plan: 1. 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  Sternal precautions             -patient may shower -ELOS/Goals: 14-18 days supervision to mod I-- With supervision goals DC 1/25 supervision/goals  - 1/23: Per patient/family request, pursuing SNF placement due to inability of caregivers to provide 24/7 support with ongoing min assist ADLs and cognitive deficits requiring assistance   -Stable to continue CIR 1/14: Needs to be independent at discharge per SW; son has moved out of state and can maybe provide 1-2 weeks on discharge. Patient's son's GF has provided some support in the hospital. Carryover very poor; Max-total assist with sternal precautions, Min A if able to push up. Difficulty with LBD and toiletting tasks - will forget what he is doing with toileting tasks. Per PT has been needing lots of  help at home too. Per SW presenting like dementia cog-wise. Doing well with Dys 3. Will need assistance at discharge; SW to discuss with family.  1-16: Discussed patient dispo requirements with son [name], emphasizing need for full supervision at discharge and anticipating will be greater than a 1 to 2 months needed.  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  After discussion with social work, family still unsure who will come in for training, will likely provide supervision for 2 weeks at home prior to deciding whether patient needs more oversight. 1/21: At bedtime incontinence per nursing. Per SW family not agreeing to care plan involving [name]/her mother, which was offered; this is consistent with patient's wishes as well. Unsure of dispo plan at this time as family is not offering alternatives.  1-22: Family discussed appealing discharge with social work due to feeling patient is not medically optimized.  Per team, primary barriers remain cognitive, not expected to improve further with additional  time.  2.  Antithrombotics: -DVT/anticoagulation:  Pharmaceutical: Lovenox 40mg  daily.  Venous Doppler studies negative -antiplatelet therapy: Aspirin 81 mg daily.  3. Pain Management: Oxycodone as needed--no complaints  4. Mood/Behavior/Sleep: Provide emotional support             - antipsychotic agents: Seroquel 25 mg daily at 1000 and 50 mg nightly   - Continue current regimen, can add melatonin 5 mg as needed  -1-13: Will start to wean off antipsychotics; DC AM Seroquel for tomorrow 1-15: Patient tolerated well, change nightly Seroquel to as needed--has resulted in somewhat interrupted sleep but so far has been tolerable.  Noel Christmas states he gets anxious at night sometimes regarding dispo issues. 1/17: No PRNs used.  Continue melatonin, will add nightly as needed trazodone 50 mg retime at bedtime medications to 8 PM given this is his normal bedtime--improved 1-20: Family reporting compulsive calls, anxieties in the early evenings between therapies and sleep.  Patient is sleeping well, no hallucinations or signs of delirium at this time.  Likely early sundowning, adding Atarax for anxiety and informed nursing to be aware of these behaviors. 04-03-21: No reports by nursing overnight, no use of as needed Atarax 1-24: Increasing anxiety and confused behavior since family elected going to SNF; encouraged use of PRN Atarax, start BuSpar 5 mg twice daily   5. Neuropsych/cognition: This patient is capable of making decisions on his own behalf.  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  Weaning sedating medications as above.  Will start Aricept 5 mg nightly-theoretical risk of arrhythmia, however on literature review cardiac events matched placebo on meta-analysis. 1-15: Tolerating Aricept well--and noticing some mild cognitive improvements 1/21: Per cognitive evaluation, patient can make  reasonable decisions for himself although underlying dementia requires some reorientation and cueing  6. Skin/Wound Care: Routine skin checks. -His right lower extremity harvest site completing course of Ancef for cellulitis --1/8: transitioned to Augmentin 1-8 to complete 10-day course--appearance slightly more edematous/erythematous 1-14, will get WBC in a.m. and monitor closely, still staying within margins of prior marking so may just be taking a long time to resolve  1-13: DC PICC line--site looks clean. -1-17: Right clavicle staples removed.  Remove abdominal sutures today. - 1/21: RLE swelling/infection resolved 1-22: Dry skin overlying prior infection site; add Eucerin daily--appearance improving  7. Fluids/Electrolytes/Nutrition/hyponatremia: Routine in and outs  with follow-up chemistries, continue supplements.  -1-8: Low albumin, protein; continue current supplemental tube feeds as below.  Encourage protein and p.o. intakes.  Very mild hyponatremia, will recheck on Friday. 1-10: Hyponatremia 129, slight downtrend from 130.  Repeat BMP, serum osmol, urine studies in a.m. to confirm etiology--will be somewhat complicated by use of torsemide.  Placed on 1200 cc fluid restriction; patient is remaining well below this. -03/24/23 Na up to 132, SOsm WNL, urine studies not done yet -03/25/23 Urine Osm still not done... scheduled weekly labs starting Monday - 1/13: Na improved/stable - repeat Wednesday--add magnesium to today's labs--2.1, stable 1-15: NA stable 131; add calcium supplementation, PRN tums, and increase protonic to 40 mg BID for at bedtime nausea 1-17: Added as needed Zofran 4 mg every 8 hours, and miscellaneous nursing order to try and divide out medications and not give all at once crushed in applesauce due to intolerance of foul taste 1-20: Only 1 use of Zofran over the weekend, no recorded emesis --still having nausea around crushed pills only  8.  Acute blood loss anemia.   Follow-up CBC -stable   - HgB improving  9.  COVID-positive.  Completed Paxlovid  -1-17: DC schedule guaifenesin.  10.  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  Seen by vascular surgery Dr Carolynn Sayers and plan outpatient follow-up 1 month  11.  History of VT peri-CABG.  Amiodarone 200 mg BID>>daily, Lanoxin 0.125 mg daily  -Remains with low/stable heart rate -1-14: Patient reporting irregular palpitations during therapies.  None documented, made PT aware so they can document any events during their therapies today.--did not occur per therapies 1/14  12.  Hyperlipidemia.  Lipitor 80mg  daily 13.  Diastolic congestive heart failure.  Torsemide 20 mg daily, Cozaar 25 mg daily, Aldactone 25 mg daily. Dapagliflozin 10mg  daily. Monitor for any signs of fluid overload.  Daily weights.  1-24: Weights with ongoing downtrend, per record patient has adequate p.o. intakes and compliant with nutrition shakes between meals, now with reports of orthostasis and blood pressure 80 over 50s this afternoon.  Reduce torsemide to 10 mg daily, and encourage p.o. fluids. - Orthostatic vitals in AM ordered Filed Weights   04/04/23 0544 04/05/23 0522 04/06/23 0529  Weight: 49 kg 48.6 kg (P) 48.6 kg    14.  Sacral wound.  Wound care nurse follow-up.  Covered with foam dressing -1-16: Caught himself on the back during a toileting transfer today per nursing, resulting in small wound, covered in barrier dressing.  15.  Dysphagia.  Dysphagia #2 thin liquids.  Nasogastric tube for nutritional support. Just upgraded diet- might not need Cortrak long -Continue supplemental tube feeds as above, encourage p.o. intakes, protein. -1-10: Eating 60 to 90% of meals per documentation.  Discontinue core track, tube feeds.--Continuing to eat well  16. Stool regimen: colace 200mg  daily  -1-17: DC liquid Colace, having regular bowel movements  -03/31/23 no BM in 3 days, will restart colace at  100mg  daily for now.  -04/01/23 pt states BM yesterday but none documented since 1/15, nurse documentation states LBM 1/16, unclear which is correct; asked nursing to please document if he's had BM today, if none today then would advance bowel regimen  -1-20: Increase Colace to 100 mg twice daily, add Senokot S1 tab at night  1/24 LBM   17. Thrombocytosis.  resolved   LOS: 17 days A FACE TO FACE EVALUATION WAS PERFORMED  Angelina Sheriff 04/06/2023, 5:00 PM

## 2023-04-06 NOTE — Progress Notes (Signed)
Occupational Therapy Session Note  Patient Details  Name: Walter Vargas MRN: [id number] Date of Birth: 02-18-1946  Today's Date: 04/06/2023 OT Individual Time: Missed 45 min despite OT encouragement. Pt adamant re not participating at this time. Will attempt to make up session as able.      Short Term Goals: Week 2:  OT Short Term Goal 1 (Week 2): Pt will complete LB dressing with min A OT Short Term Goal 2 (Week 2): Pt will complete all toileting tasks with min A overall OT Short Term Goal 3 (Week 2): Pt will carryover hand placement for transfers with no cueing 2 consecutive sessions  Skilled Therapeutic Interventions/Progress Updates:   See above   Therapy/Group: Individual Therapy  Vicenta Dunning 04/06/2023, 7:15 AM

## 2023-04-06 NOTE — Plan of Care (Signed)
  Problem: Consults Goal: RH GENERAL PATIENT EDUCATION Description: See Patient Education module for education specifics. Outcome: Progressing   Problem: RH BOWEL ELIMINATION Goal: RH STG MANAGE BOWEL WITH ASSISTANCE Description: STG Manage Bowel with toileting Assistance. Outcome: Progressing Goal: RH STG MANAGE BOWEL W/MEDICATION W/ASSISTANCE Description: STG Manage Bowel with Medication with Mod I Assistance. Outcome: Progressing   Problem: RH BLADDER ELIMINATION Goal: RH STG MANAGE BLADDER WITH ASSISTANCE Description: STG Manage Bladder With toileting Assistance Outcome: Progressing Note: Able to ask for urinal   Problem: RH SKIN INTEGRITY Goal: RH STG SKIN FREE OF INFECTION/BREAKDOWN Description: Manage skin with min assist Outcome: Progressing Note: Stg 2 healed on backside, erythma almost gone on glutes and peronium,    Problem: RH KNOWLEDGE DEFICIT GENERAL Goal: RH STG INCREASE KNOWLEDGE OF SELF CARE AFTER HOSPITALIZATION Description: Patient will be able to take care of his meds ; diet and Manage skin care using educational resources independently  Outcome: Not Progressing   Problem: RH PAIN MANAGEMENT Goal: RH STG PAIN MANAGED AT OR BELOW PT'S PAIN GOAL Description: Less than 4 with prns Outcome: Adequate for Discharge

## 2023-04-06 NOTE — Progress Notes (Signed)
Patient ID: Walter Vargas, male   DOB: 1946-01-20, 78 y.o.   MRN: [id number]  [phone]- SW returned phone call to pt BIL and sister and provided updates on current decision on SNF. SW explained all possible barriers to transition, and pt will require 24/7 care. SW discussed in length SNF Placement process and coming up with discharge plan if he is OR is not accepted into SNF. SW discussed APS referral would be submitted if family is reporting they are unable to provide care to him. Will follow-up with his nephew [name] to discuss further.   *SW met with pt and family friends in room [name] and her mother) to discuss SNF placement, and likelihood that he may not return home after placement. He is worried about losing his apartment but realizes he needs help when he goes home. He is aware efforts will be made to keep him in or near [place]. SW discussed Medicaid application will need to be completed and will need to also apply for LTC Medicaid as well. SW will leave application in room.   1044-SW discussed with pt son [name] above. He intends to fly out to [facility] around Feb 6. He is aware his father will remain here until a SNF bed is offered, and insurance makes a decision.   SW left Medicaid application in room.   [name], MSW, LCSW Office: [phone] Cell: [phone] Fax: [phone]

## 2023-04-06 NOTE — Progress Notes (Addendum)
Speech Language Pathology Daily Session Note  Patient Details  Name: Walter Vargas MRN: [id number] Date of Birth: 10-07-1945  Today's Date: 04/06/2023 SLP Individual Time: 1019-1028 SLP Individual Time Calculation (min): 9 min  Short Term Goals: Week 2: SLP Short Term Goal 1 (Week 2): Patient will orient to date and time using available external aids with mod assist. SLP Short Term Goal 2 (Week 2): Patient will utilize external memory aids to recall daily information with 50% accuracy given mod assist. SLP Short Term Goal 3 (Week 2): Patient will demonstrate intellectual awareness of deficits as evidenced by stating 2 cognitive and 2 physical deficits with max assist. SLP Short Term Goal 4 (Week 2): Patient will solve basic environmental problems with 75% accuracy given mod assist. SLP Short Term Goal 5 (Week 2): Patient will recall and utilize swallow safety strategy of intermittent throat clear during assessment of least restrictive diet tolerance 90% of the time given mod assist. SLP Short Term Goal 6 (Week 2): Patient will participate in trials of upgraded solids with no overt s/sx of difficulty across two sessions prior to diet upgrade.  Skilled Therapeutic Interventions: SLP conducted skilled therapy session briefly targeting cognitive retraining goals and education of present caregivers. Upon SLP arrival, patient expressing that he is fed up with nursing and therapy staff coming in and out constantly, and all he wants to do is converse with his visitors [name] and [place]). Patient exhibits frustration with entry of SLP but is agreeable to SLP joining conversation. Throughout discussion of various topics, patient exhibits impaired intellectual awareness often referring to "going home today" despite discussion of SNF discharge. Patient also demonstrates perseveration on his phone being broken despite caregiver fixing cellphone previously. Patient reports high levels of anxiety, which is likely  contributing to reduced therapy participation. In discussion with SLP, patient recalls facts learned about therapist during previous session accurately with overall min assist. During session, SLP discussed with available caregivers the role of the SLP and therapy targets during patient's CIR admission with caregiver verbalizing understanding. Patient indicated that he would no longer like to do any work and with entry of more staff, exhibiting increasing frustration, thus SLP discontinued session. Patient was left in lowered bed with call bell in reach and bed alarm set. SLP will continue to target goals per plan of care.       Pain Pain Assessment Pain Scale: 0-10 Pain Score: 0-No pain  Therapy/Group: Individual Therapy  Jeannie Done, M.A., CCC-SLP  Yetta Barre 04/06/2023, 10:39 AM

## 2023-04-06 NOTE — Progress Notes (Signed)
Physical Therapy Session Note  Patient Details  Name: Walter Vargas MRN: [id number] Date of Birth: 17-May-1945  Today's Date: 04/06/2023 PT Individual Time: 1102-1200 PT Individual Time Calculation (min): 58 min   Short Term Goals: Week 2:  PT Short Term Goal 1 (Week 2): STGs = LTGs  Skilled Therapeutic Interventions/Progress Updates:     Pt received semi reclined and reluctantly agrees to therapy following encouragement from PT and family friends, [name] and [place]. Pt does not complain of pain. Pt performs supine to sit with cues for sequencing. Pt dons shoes while seated at EOB with setup assistance. Pt performs stand step transfer to [facility] with CGA and cues for sequencing. PT transport pt to atrium of hospital for change of scenery and to provide pt with natural light, as well as performing mobility in open environment. Pt ambulates into [employer] with RW and CGA, with cues for increasing proximity to RW for safety, and maintaining upright posture to improve balance. Pt requires minA once inside [employer] to safely avoid obstacles, with cues for RW management. Pt ambulates additional bouts of 3x250' with extended seated rest breaks, and similar assistance and cues. Pt left seated in WC with alarm intact and all needs within reach.   Therapy Documentation Precautions:  Precautions Precautions: Fall, Sternal, Other (comment) Precaution Booklet Issued: No Precaution Comments: cortrak, sternal precautions Restrictions Weight Bearing Restrictions Per Provider Order: No RUE Weight Bearing Per Provider Order: Weight bearing as tolerated LUE Weight Bearing Per Provider Order: Weight bearing as tolerated    Therapy/Group: Individual Therapy  Beau Fanny, PT, DPT 04/06/2023, 12:11 PM

## 2023-04-06 NOTE — Progress Notes (Signed)
Occupational Therapy Weekly Progress Note  Patient Details  Name: Walter Vargas MRN: [id number] Date of Birth: 07/13/1945  Beginning of progress report period: March 28, 2023 End of progress report period: April 06, 2023  Today's Date: 04/06/2023 OT Individual Time: 1355-1420 OT Individual Time Calculation (min): 25 min  20 min missed d/t refusal   Patient has met 0 of 3 short term goals. Walter Vargas is making slow progress toward his ADL goals 2/2 cognitive deficits. Physically he is making progress, he is able to stand with CGA when properly placing his hands. CGA for UB ADLs, min A LB. He has had mild improvements in carryover and with continued OT at SNF level he will hopefully make improvements to facilitate a safe d/c plan. CSW has been making contact with his son to discuss d/c, with OT/PT/SLP expressing concerns with pt being alone at home 2/2 cognitive deficits.   Patient continues to demonstrate the following deficits: muscle weakness, decreased cardiorespiratoy endurance, decreased initiation, decreased awareness, decreased problem solving, decreased safety awareness, decreased memory, and delayed processing, and decreased sitting balance, decreased standing balance, decreased postural control, decreased balance strategies, and difficulty maintaining precautions and therefore will continue to benefit from skilled OT intervention to enhance overall performance with BADL and iADL.  Patient not progressing toward long term goals.  See goal revision..  Plan of care revisions: Discharge plan changed to SNF.  OT Short Term Goals Week 2:  OT Short Term Goal 1 (Week 2): Pt will complete LB dressing with min A OT Short Term Goal 1 - Progress (Week 2): Not met OT Short Term Goal 2 (Week 2): Pt will complete all toileting tasks with min A overall OT Short Term Goal 2 - Progress (Week 2): Not met OT Short Term Goal 3 (Week 2): Pt will carryover hand placement for transfers with no cueing 2  consecutive sessions OT Short Term Goal 3 - Progress (Week 2): Not met Week 3:  OT Short Term Goal 1 (Week 3): Pt will complete toileting tasks with min A OT Short Term Goal 2 (Week 3): Pt will utilize external cues to properly place hand on w/c during sit > stand OT Short Term Goal 3 (Week 3): Pt will complete full ADL routine with no more than 1 rest break to demo improved activity tolerance  Skilled Therapeutic Interventions/Progress Updates:    Pt supine adamantly declining participation when OT entered room. When questioned why he stated "that man said to lay right here because of my blood pressure". Checked BP and it was 128/48. Provided education on importance of getting OOB for BP support. Pt asking for assist with charging phone despite phone battery on 88%. He was perseverative on his battery needing to be replaced- informed him this had just recently been done and every time OT is in room phone is on 85 or greater battery, pt argumentative stating "that battery is broken". Encouraged PO intake and retrieved pt a cold vanilla ensure- his preference. He took several sips with OT in room. Provided external cueing for hand placement during transfers via a sign taped to his RW to trial in future sessions when OT participates. Pt left supine with all needs met. Pt more confused this session overall- PA aware. Bed alarm set. 20 min missed d/t refusal.   Therapy Documentation Precautions:  Precautions Precautions: Fall, Sternal, Other (comment) Precaution Booklet Issued: No Precaution Comments: cortrak, sternal precautions Restrictions Weight Bearing Restrictions Per Provider Order: No RUE Weight Bearing Per Provider Order:  Weight bearing as tolerated LUE Weight Bearing Per Provider Order: Weight bearing as tolerated  Therapy/Group: Individual Therapy  [name] 04/06/2023, 8:33 AM

## 2023-04-07 LAB — COMPREHENSIVE METABOLIC PANEL
ALT: 20 U/L (ref 0–44)
AST: 28 U/L (ref 15–41)
Albumin: 3.6 g/dL (ref 3.5–5.0)
Alkaline Phosphatase: 89 U/L (ref 38–126)
Anion gap: 15 (ref 5–15)
BUN: 47 mg/dL — ABNORMAL HIGH (ref 8–23)
CO2: 25 mmol/L (ref 22–32)
Calcium: 9.6 mg/dL (ref 8.9–10.3)
Chloride: 92 mmol/L — ABNORMAL LOW (ref 98–111)
Creatinine, Ser: 1.44 mg/dL — ABNORMAL HIGH (ref 0.61–1.24)
GFR, Estimated: 50 mL/min — ABNORMAL LOW (ref >60)
Glucose, Bld: 101 mg/dL — ABNORMAL HIGH (ref 70–99)
Potassium: 4.5 mmol/L (ref 3.5–5.1)
Sodium: 132 mmol/L — ABNORMAL LOW (ref 135–145)
Total Bilirubin: 1.2 mg/dL (ref 0.0–1.2)
Total Protein: 7.5 g/dL (ref 6.5–8.1)

## 2023-04-07 MED ORDER — SORBITOL 70 % SOLN
30.0000 mL | Freq: Once | Status: AC
  Administered 2023-04-07: 30 mL via ORAL
  Filled 2023-04-07: qty 30

## 2023-04-07 MED ORDER — MINERAL OIL RE ENEM
1.0000 | ENEMA | Freq: Once | RECTAL | Status: AC
  Administered 2023-04-07: 1 via RECTAL
  Filled 2023-04-07: qty 1

## 2023-04-07 NOTE — Progress Notes (Signed)
Speech Language Pathology Weekly Progress and Session Note  Patient Details  Name: Walter Vargas MRN: [id number] Date of Birth: July 03, 1945  Beginning of progress report period: March 29, 2023 End of progress report period: April 07, 2023  Today's Date: 04/07/2023 SLP Individual Time: 4098-1191 SLP Individual Time Calculation (min): 60 min   New Short Term Goals: Week 2: SLP Short Term Goal 1 (Week 2): Patient will orient to date and time using available external aids with mod assist. SLP Short Term Goal 1 - Progress (Week 2): Progressing toward goal SLP Short Term Goal 2 (Week 2): Patient will utilize external memory aids to recall daily information with 50% accuracy given mod assist. SLP Short Term Goal 2 - Progress (Week 2): Progressing toward goal SLP Short Term Goal 3 (Week 2): Patient will demonstrate intellectual awareness of deficits as evidenced by stating 2 cognitive and 2 physical deficits with max assist. SLP Short Term Goal 3 - Progress (Week 2): Not met SLP Short Term Goal 4 (Week 2): Patient will solve basic environmental problems with 75% accuracy given mod assist. SLP Short Term Goal 4 - Progress (Week 2): Not met SLP Short Term Goal 5 (Week 2): Patient will recall and utilize swallow safety strategy of intermittent throat clear during assessment of least restrictive diet tolerance 90% of the time given mod assist. SLP Short Term Goal 5 - Progress (Week 2): Not met SLP Short Term Goal 6 (Week 2): Patient will participate in trials of upgraded solids with no overt s/sx of difficulty across two sessions prior to diet upgrade. SLP Short Term Goal 6 - Progress (Week 2): Progressing toward goal  Weekly Progress Updates:  Patient has made limited progress on his goals this reporting period due to reduced participation in speech therapy sessions. He is progressing towards 3 of 6 goals. He requires mod-max cues to use external memory aid (log, room board etc) to orient himself  to functional information in the current environment. He continues to benefit from max A to recall safe swallowing strategies but is tolerating Dys3/thin liquid diet well. Patient and family education ongoing. Patient will continue to benefit from skilled therapy services during remainder of CIR stay.    Intensity: Minumum of 1-2 x/day, 30 to 90 minutes Frequency: 3 to 5 out of 7 days Duration/Length of Stay:   Treatment/Interventions: Cognitive remediation/compensation;Environmental controls;Cueing hierarchy;Functional tasks;Therapeutic Activities;Therapeutic Exercise;Internal/external aids;Medication managment;Patient/family education;Dysphagia/aspiration precaution training   Daily Session  Skilled Therapeutic Interventions:   Pt seen for ST targeting cognitive linguistic therapy. Pt was alert in bed upon SLP arrival, reported 4/10 pain in abdomen d/t discomfort from constipation. Pt also with frequent dry nonproductive cough at start of session. SLP alerted pt's nurse. Oriented to month, year, place, and situation.  Pt had 0% acc for recall information from SW visit yesterday without use of visual aid, improved to 100% acc with use of memory log when provided min verbal cues (VC) to use it. Pt used a visual aid (calendar) to orient himself to the current DOW when provided min-mod VC. He stated that he "didn't want to use that".  Pt with continued frequent questions about when he would receive an enema and was perseverating on it. SLP facilitated spaced retrieval training to increase recall of this information and he recalled it with 100% acc up to 4 minutes. Pt declined to fill out memory log so SLP completed it. Without it, pt recalled what was practiced in tx with 0% acc, improved to 100% acc when  using the memory log when provided mod A.   Pt continent of urine with urinal when provided min A. Pt left in bed with call bell in reach and bed alarm activated. Continue ST POC.      General     Pain Pain Assessment Pain Scale: 0-10 Pain Score: 4  Pain Type: Acute pain Pain Location: Abdomen Pain Descriptors / Indicators: Aching;Discomfort  Therapy/Group: Individual Therapy  Alphonsus Sias 04/07/2023, 12:48 PM

## 2023-04-07 NOTE — Progress Notes (Signed)
Occupational Therapy Session Note  Patient Details  Name: Walter Vargas MRN: [id number] Date of Birth: 10-Dec-1945  Today's Date: 04/07/2023 OT Individual Time: 2841-3244 OT Individual Time Calculation (min): 51 min    Short Term Goals: Week 1:  OT Short Term Goal 1 (Week 1): Pt will don a shirt with min A OT Short Term Goal 1 - Progress (Week 1): Met OT Short Term Goal 2 (Week 1): Pt will complete LB dressing with min A OT Short Term Goal 2 - Progress (Week 1): Not met OT Short Term Goal 3 (Week 1): Pt will complete toilet transfer with min A OT Short Term Goal 3 - Progress (Week 1): Met OT Short Term Goal 4 (Week 1): Pt will complete ADL routine with no more than 1 rest break to demo increased activity tolerance OT Short Term Goal 4 - Progress (Week 1): Progressing toward goal  Skilled Therapeutic Interventions/Progress Updates:    The pt was awake in bed at the time of arrival, nursing arrived to provide morning  medication and meds to alleviate the pt's symptom of constipation. The pt was able to come from supine in bed to EOB with SBA. The pt was able to doff his over head  shirt with MinA.  The pt was able to bath his UB with s/uA, he was able to come from sit to stand with MinA for  maintain standing balance incorporating the RW for  doffing his LB garment, his brief with MinA. The pt was able to wash his LB with MinA, which included his  perineal area, his bottom, and the upper portion of his BLE. The pt was MaxA for the lower portion of BLE. The pt was MinA for treading his legs through his pants and he was MaxA for donning his brief.  The pt was MinA for pulling his pants over his bottom. The pt was able to brush his teeth with s/u A and was able to transfer back to bed LOF with CGA with his call light and bedside table within reach and all additional needs addressed.   Therapy Documentation Precautions:  Precautions Precautions: Fall, Sternal, Other (comment) Precaution Booklet  Issued: No Precaution Comments: cortrak, sternal precautions Restrictions Weight Bearing Restrictions Per Provider Order: No RUE Weight Bearing Per Provider Order: Weight bearing as tolerated LUE Weight Bearing Per Provider Order: Weight bearing as tolerated  Therapy/Group: Individual Therapy  Lavona Mound 04/07/2023, 12:58 PM

## 2023-04-07 NOTE — Progress Notes (Signed)
PROGRESS NOTE   Subjective/Complaints:  Pt doing ok, didn't sleep well. Pain well managed but he's complaining that he needs to have a BM and it hurts in his rectum because he's got a hard ball of stool. Wants to try an enema. He's not sure of his LBM, last documentation was 1/24 smear, and before that was 1/21. Urinating ok, no other complaints or concerns.    ROS: Denies fevers, chills,  abd pain, vomiting, diarrhea, SOB, cough, chest pain, new weakness or paraesthesias.   + Nausea w/ pills crushed in food--ongoing, slightly improved + anxiety--ongoing, worsening + Right lower extremity dry skin-stable +constipation  Objective:   No results found.   No results for input(s): "WBC", "HGB", "HCT", "PLT" in the last 72 hours.    No results for input(s): "NA", "K", "CL", "CO2", "GLUCOSE", "BUN", "CREATININE", "CALCIUM" in the last 72 hours.   No intake or output data in the 24 hours ending 04/07/23 1231    Pressure Injury 03/15/23 Coccyx Mid;Lower Stage 2 -  Partial thickness loss of dermis presenting as a shallow open injury with a red, pink wound bed without slough. (Active)  03/15/23 0800  Location: Coccyx  Location Orientation: Mid;Lower  Staging: Stage 2 -  Partial thickness loss of dermis presenting as a shallow open injury with a red, pink wound bed without slough.  Wound Description (Comments):   Present on Admission: Yes    Physical Exam: Vital Signs Blood pressure 123/67, pulse (!) 56, temperature 98 F (36.7 C), resp. rate 16, height 5\' 8"  (1.727 m), weight 49.6 kg, SpO2 100%.   Constitution: Appropriate appearance for age. No apparent distress.  Laying in bed. Resp:   clear to auscultation bilaterally.  On room air, no respiratory distress. Cardio: Regular rate, reg rhythm. Well perfused appearance.  Chronic bilateral lower extremity skin changes.No edema.  Abdomen: soft, Nondistended but hard palpable  stool in lower abdomen. Nontender, surprisingly.  +BS throughout, but a little hypoactive.   Psych: Appropriate mood and affect.  Mildly anxious. Skin:   + Right inner thigh/calf with scabbing from vein harvest site-now with some dry, cracked appearing skin around it. --Improving  PRIOR EXAMS: MSK: Severe lumbar levoscoliosis.  Neurologic Exam:  Awake, alert, and oriented to self, place, year, and month; not date + Moderate memory and higher cognitive deficits  - ongoing  Follows all simple commands. Cranial nerves II through XII intact.  Sensation intact.  No fine motor deficits.  No ataxia. Insight: Fair insight into current condition Strength: Antigravity against resistance all 4 extremities grossly 5- out of 5 throughout   Assessment/Plan: 1. Functional deficits which require 3+ hours per day of interdisciplinary therapy in a comprehensive inpatient rehab setting. Physiatrist is providing close team supervision and 24 hour management of active medical problems listed below. Physiatrist and rehab team continue to assess barriers to discharge/monitor patient progress toward functional and medical goals  Care Tool:  Bathing    Body parts bathed by patient: Right arm, Left arm, Chest, Abdomen, Front perineal area, Buttocks, Right upper leg, Left upper leg, Face, Right lower leg, Left lower leg   Body parts bathed by helper: Right lower leg, Left lower  leg     Bathing assist Assist Level: Contact Guard/Touching assist     Upper Body Dressing/Undressing Upper body dressing   What is the patient wearing?: Pull over shirt    Upper body assist Assist Level: Contact Guard/Touching assist    Lower Body Dressing/Undressing Lower body dressing      What is the patient wearing?: Pants, Underwear/pull up     Lower body assist Assist for lower body dressing: Minimal Assistance - Patient > 75%     Toileting Toileting    Toileting assist Assist for toileting: Moderate  Assistance - Patient 50 - 74%     Transfers Chair/bed transfer  Transfers assist     Chair/bed transfer assist level: Minimal Assistance - Patient > 75%     Locomotion Ambulation   Ambulation assist      Assist level: 2 helpers Assistive device: No Device Max distance: 5'   Walk 10 feet activity   Assist  Walk 10 feet activity did not occur: Safety/medical concerns  Assist level:  (able to ambulate with RW, but at basline did not use an AD)     Walk 50 feet activity   Assist Walk 50 feet with 2 turns activity did not occur: Safety/medical concerns         Walk 150 feet activity   Assist Walk 150 feet activity did not occur: Safety/medical concerns         Walk 10 feet on uneven surface  activity   Assist Walk 10 feet on uneven surfaces activity did not occur: Safety/medical concerns         Wheelchair     Assist Is the patient using a wheelchair?: Yes Type of Wheelchair: Manual Wheelchair activity did not occur:  (Sternal precautions)  Wheelchair assist level: Dependent - Patient 0% Max wheelchair distance: 150'    Wheelchair 50 feet with 2 turns activity    Assist    Wheelchair 50 feet with 2 turns activity did not occur: Safety/medical concerns       Wheelchair 150 feet activity     Assist  Wheelchair 150 feet activity did not occur: Safety/medical concerns       Blood pressure 123/67, pulse (!) 56, temperature 98 F (36.7 C), resp. rate 16, height 5\' 8"  (1.727 m), weight 49.6 kg, SpO2 100%.  Medical Problem List and Plan: 1. 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  Sternal precautions             -patient may shower -ELOS/Goals: 14-18 days supervision to mod I-- With supervision goals DC 1/25 supervision/goals  - 1/23: Per patient/family request, pursuing SNF placement due to inability of caregivers to provide  24/7 support with ongoing min assist ADLs and cognitive deficits requiring assistance   -Stable to continue CIR 1/14: Needs to be independent at discharge per SW; son has moved out of state and can maybe provide 1-2 weeks on discharge. Patient's son's GF has provided some support in the hospital. Carryover very poor; Max-total assist with sternal precautions, Min A if able to push up. Difficulty with LBD and toiletting tasks - will forget what he is doing with toileting tasks. Per PT has been needing lots of help at home too. Per SW presenting like dementia cog-wise. Doing well with Dys 3. Will need assistance at discharge; SW to discuss with family.  1-16: Discussed patient dispo requirements with son [name], emphasizing need for full supervision at discharge and anticipating  will be greater than a 1 to 2 months needed.  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  After discussion with social work, family still unsure who will come in for training, will likely provide supervision for 2 weeks at home prior to deciding whether patient needs more oversight. 1/21: At bedtime incontinence per nursing. Per SW family not agreeing to care plan involving [name]/her mother, which was offered; this is consistent with patient's wishes as well. Unsure of dispo plan at this time as family is not offering alternatives.  1-22: Family discussed appealing discharge with social work due to feeling patient is not medically optimized.  Per team, primary barriers remain cognitive, not expected to improve further with additional time.  2.  Antithrombotics: -DVT/anticoagulation:  Pharmaceutical: Lovenox 40mg  daily.  Venous Doppler studies negative -antiplatelet therapy: Aspirin 81 mg daily.  3. Pain Management: Oxycodone as needed--no complaints  4. Mood/Behavior/Sleep: Provide emotional support             -  antipsychotic agents: Seroquel 25 mg daily at 1000 and 50 mg nightly   - Continue current regimen, can add melatonin 5 mg as needed  -1-13: Will start to wean off antipsychotics; DC AM Seroquel for tomorrow 1-15: Patient tolerated well, change nightly Seroquel to as needed--has resulted in somewhat interrupted sleep but so far has been tolerable.  Noel Christmas states he gets anxious at night sometimes regarding dispo issues. 1/17: No PRNs used.  Continue melatonin, will add nightly as needed trazodone 50 mg retime at bedtime medications to 8 PM given this is his normal bedtime--improved 1-20: Family reporting compulsive calls, anxieties in the early evenings between therapies and sleep.  Patient is sleeping well, no hallucinations or signs of delirium at this time.  Likely early sundowning, adding Atarax for anxiety and informed nursing to be aware of these behaviors. 04-03-21: No reports by nursing overnight, no use of as needed Atarax 1-24: Increasing anxiety and confused behavior since family elected going to SNF; encouraged use of PRN Atarax, start BuSpar 5 mg twice daily   5. Neuropsych/cognition: This patient is capable of making decisions on his own behalf.  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  Weaning sedating medications as above.  Will start Aricept 5 mg nightly-theoretical risk of arrhythmia, however on literature review cardiac events matched placebo on meta-analysis. 1-15: Tolerating Aricept well--and noticing some mild cognitive improvements 1/21: Per cognitive evaluation, patient can make reasonable decisions for himself although underlying dementia requires some reorientation and cueing  6. Skin/Wound Care: Routine skin checks. -His right lower extremity harvest site completing course of Ancef for cellulitis --1/8: transitioned to Augmentin 1-8 to complete 10-day  course--appearance slightly more edematous/erythematous 1-14, will get WBC in a.m. and monitor closely, still staying within margins of prior marking so may just be taking a long time to resolve  1-13: DC PICC line--site looks clean. -1-17: Right clavicle staples removed.  Remove abdominal sutures today. - 1/21: RLE swelling/infection resolved 1-22: Dry skin overlying prior infection site; add Eucerin daily--appearance improving  7. Fluids/Electrolytes/Nutrition/hyponatremia: Routine in and outs with follow-up chemistries, continue supplements.  -1-8: Low albumin, protein; continue current supplemental tube feeds as below.  Encourage protein and p.o. intakes.  Very mild hyponatremia, will recheck on Friday. 1-10: Hyponatremia 129, slight downtrend from 130.  Repeat BMP, serum osmol,  urine studies in a.m. to confirm etiology--will be somewhat complicated by use of torsemide.  Placed on 1200 cc fluid restriction; patient is remaining well below this. -03/24/23 Na up to 132, SOsm WNL, urine studies not done yet -03/25/23 Urine Osm still not done... scheduled weekly labs starting Monday - 1/13: Na improved/stable - repeat Wednesday--add magnesium to today's labs--2.1, stable 1-15: NA stable 131; add calcium supplementation, PRN tums, and increase protonic to 40 mg BID for at bedtime nausea 1-17: Added as needed Zofran 4 mg every 8 hours, and miscellaneous nursing order to try and divide out medications and not give all at once crushed in applesauce due to intolerance of foul taste 1-20: Only 1 use of Zofran over the weekend, no recorded emesis --still having nausea around crushed pills only  8.  Acute blood loss anemia.  Follow-up CBC -stable   - HgB improving  9.  COVID-positive.  Completed Paxlovid  -1-17: DC schedule guaifenesin.  10.  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  Seen by vascular surgery Dr Carolynn Sayers and plan outpatient follow-up 1  month  11.  History of VT peri-CABG.  Amiodarone 200 mg BID>>daily, Lanoxin 0.125 mg daily  -Remains with low/stable heart rate -1-14: Patient reporting irregular palpitations during therapies.  None documented, made PT aware so they can document any events during their therapies today.--did not occur per therapies 1/14  12.  Hyperlipidemia.  Lipitor 80mg  daily 13.  Diastolic congestive heart failure.  Torsemide 20 mg daily, Cozaar 25 mg daily, Aldactone 25 mg daily. Dapagliflozin 10mg  daily. Monitor for any signs of fluid overload.  Daily weights.  1-24: Weights with ongoing downtrend, per record patient has adequate p.o. intakes and compliant with nutrition shakes between meals, now with reports of orthostasis and blood pressure 80 over 50s this afternoon.  Reduce torsemide to 10 mg daily, and encourage p.o. fluids. - Orthostatic vitals in AM ordered -04/07/23 wt fairly stable; monitor Filed Weights   04/05/23 0522 04/06/23 0529 04/07/23 0500  Weight: 48.6 kg 48.6 kg 49.6 kg    14.  Sacral wound.  Wound care nurse follow-up.  Covered with foam dressing -1-16: Caught himself on the back during a toileting transfer today per nursing, resulting in small wound, covered in barrier dressing.  15.  Dysphagia.  Dysphagia #2 thin liquids.  Nasogastric tube for nutritional support. Just upgraded diet- might not need Cortrak long -Continue supplemental tube feeds as above, encourage p.o. intakes, protein. -1-10: Eating 60 to 90% of meals per documentation.  Discontinue core track, tube feeds.--Continuing to eat well  16. Stool regimen: colace 200mg  daily  -1-17: DC liquid Colace, having regular bowel movements  -03/31/23 no BM in 3 days, will restart colace at 100mg  daily for now.  -04/01/23 pt states BM yesterday but none documented since 1/15, nurse documentation states LBM 1/16, unclear which is correct; asked nursing to please document if he's had BM today, if none today then would advance bowel  regimen --1-20: Increase Colace to 100 mg twice daily, add Senokot S1 tab at night  -1/24 LBM  -04/07/23 no good BM since 1/21, just a smear on 1/24; hard palpable stool in lower abdomen, pt feels it in rectum; will do sorbitol then mineral oil enema. Hopefully this improves it.   17. Thrombocytosis.  resolved   LOS: 18 days A FACE TO FACE EVALUATION WAS PERFORMED  [address] 04/07/2023, 12:31 PM

## 2023-04-08 DIAGNOSIS — N179 Acute kidney failure, unspecified: Secondary | ICD-10-CM

## 2023-04-08 NOTE — Plan of Care (Signed)
  Problem: Consults Goal: RH GENERAL PATIENT EDUCATION Description: See Patient Education module for education specifics. Outcome: Progressing   Problem: RH BOWEL ELIMINATION Goal: RH STG MANAGE BOWEL WITH ASSISTANCE Description: STG Manage Bowel with toileting Assistance. Outcome: Progressing Goal: RH STG MANAGE BOWEL W/MEDICATION W/ASSISTANCE Description: STG Manage Bowel with Medication with Mod I Assistance. Outcome: Progressing   Problem: RH BLADDER ELIMINATION Goal: RH STG MANAGE BLADDER WITH ASSISTANCE Description: STG Manage Bladder With toileting Assistance Outcome: Progressing   Problem: RH SKIN INTEGRITY Goal: RH STG SKIN FREE OF INFECTION/BREAKDOWN Description: Manage skin with min assist Outcome: Progressing   Problem: RH PAIN MANAGEMENT Goal: RH STG PAIN MANAGED AT OR BELOW PT'S PAIN GOAL Description: Less than 4 with prns Outcome: Progressing

## 2023-04-08 NOTE — Plan of Care (Signed)
  Problem: Consults Goal: RH GENERAL PATIENT EDUCATION Description: See Patient Education module for education specifics. Outcome: Progressing   Problem: RH BOWEL ELIMINATION Goal: RH STG MANAGE BOWEL WITH ASSISTANCE Description: STG Manage Bowel with toileting Assistance. Outcome: Progressing Goal: RH STG MANAGE BOWEL W/MEDICATION W/ASSISTANCE Description: STG Manage Bowel with Medication with Mod I Assistance. Outcome: Progressing   Problem: RH BLADDER ELIMINATION Goal: RH STG MANAGE BLADDER WITH ASSISTANCE Description: STG Manage Bladder With toileting Assistance Outcome: Progressing   Problem: RH SKIN INTEGRITY Goal: RH STG SKIN FREE OF INFECTION/BREAKDOWN Description: Manage skin with min assist Outcome: Progressing Goal: RH STG ABLE TO PERFORM INCISION/WOUND CARE W/ASSISTANCE Description: STG Able To Perform Incision/Wound Care With min  Assistance. Outcome: Progressing   Problem: RH SKIN INTEGRITY Goal: RH STG ABLE TO PERFORM INCISION/WOUND CARE W/ASSISTANCE Description: STG Able To Perform Incision/Wound Care With min  Assistance. Outcome: Progressing

## 2023-04-08 NOTE — Progress Notes (Signed)
PROGRESS NOTE   Subjective/Complaints:  Pt doing ok again today, slept well last night. Denies pain. Had several BMs yesterday, feels much better. Urinating ok, no other complaints or concerns.    ROS: Denies fevers, chills,  abd pain, vomiting, diarrhea, SOB, cough, chest pain, new weakness or paraesthesias.   + Nausea w/ pills crushed in food--ongoing, slightly improved + anxiety--ongoing, worsening + Right lower extremity dry skin-stable +constipation-resolved  Objective:   No results found.   No results for input(s): "WBC", "HGB", "HCT", "PLT" in the last 72 hours.    Recent Labs    04/07/23 1532  NA 132*  K 4.5  CL 92*  CO2 25  GLUCOSE 101*  BUN 47*  CREATININE 1.44*  CALCIUM 9.6      Intake/Output Summary (Last 24 hours) at 04/08/2023 1349 Last data filed at 04/08/2023 0900 Gross per 24 hour  Intake 240 ml  Output 50 ml  Net 190 ml      Pressure Injury 03/15/23 Coccyx Mid;Lower Stage 2 -  Partial thickness loss of dermis presenting as a shallow open injury with a red, pink wound bed without slough. (Active)  03/15/23 0800  Location: Coccyx  Location Orientation: Mid;Lower  Staging: Stage 2 -  Partial thickness loss of dermis presenting as a shallow open injury with a red, pink wound bed without slough.  Wound Description (Comments):   Present on Admission: Yes    Physical Exam: Vital Signs Blood pressure (!) 106/51, pulse (!) 57, temperature 98.2 F (36.8 C), resp. rate 17, height 5\' 8"  (1.727 m), weight 49.7 kg, SpO2 99%.   Constitution: Appropriate appearance for age. No apparent distress.  Laying in bed. Resp:   clear to auscultation bilaterally.  On room air, no respiratory distress. Cardio: Regular rate, reg rhythm. Well perfused appearance.  Chronic bilateral lower extremity skin changes.No edema.  Abdomen: soft, Nondistended. Nontender, no further stool burden felt.  +BS throughout,  normoactive today.   Psych: Appropriate mood and affect.  Mildly anxious. Skin:   + Right inner thigh/calf with scabbing from vein harvest site-now with some dry, cracked appearing skin around it. --Improving  PRIOR EXAMS: MSK: Severe lumbar levoscoliosis.  Neurologic Exam:  Awake, alert, and oriented to self, place, year, and month; not date + Moderate memory and higher cognitive deficits  - ongoing  Follows all simple commands. Cranial nerves II through XII intact.  Sensation intact.  No fine motor deficits.  No ataxia. Insight: Fair insight into current condition Strength: Antigravity against resistance all 4 extremities grossly 5- out of 5 throughout   Assessment/Plan: 1. Functional deficits which require 3+ hours per day of interdisciplinary therapy in a comprehensive inpatient rehab setting. Physiatrist is providing close team supervision and 24 hour management of active medical problems listed below. Physiatrist and rehab team continue to assess barriers to discharge/monitor patient progress toward functional and medical goals  Care Tool:  Bathing    Body parts bathed by patient: Right arm, Left arm, Chest, Abdomen, Front perineal area, Buttocks, Right upper leg, Left upper leg, Face, Right lower leg, Left lower leg   Body parts bathed by helper: Right lower leg, Left lower leg  Bathing assist Assist Level: Contact Guard/Touching assist     Upper Body Dressing/Undressing Upper body dressing   What is the patient wearing?: Pull over shirt    Upper body assist Assist Level: Contact Guard/Touching assist    Lower Body Dressing/Undressing Lower body dressing      What is the patient wearing?: Pants, Underwear/pull up     Lower body assist Assist for lower body dressing: Minimal Assistance - Patient > 75%     Toileting Toileting    Toileting assist Assist for toileting: Moderate Assistance - Patient 50 - 74%     Transfers Chair/bed transfer  Transfers  assist     Chair/bed transfer assist level: Minimal Assistance - Patient > 75%     Locomotion Ambulation   Ambulation assist      Assist level: 2 helpers Assistive device: No Device Max distance: 5'   Walk 10 feet activity   Assist  Walk 10 feet activity did not occur: Safety/medical concerns  Assist level:  (able to ambulate with RW, but at basline did not use an AD)     Walk 50 feet activity   Assist Walk 50 feet with 2 turns activity did not occur: Safety/medical concerns         Walk 150 feet activity   Assist Walk 150 feet activity did not occur: Safety/medical concerns         Walk 10 feet on uneven surface  activity   Assist Walk 10 feet on uneven surfaces activity did not occur: Safety/medical concerns         Wheelchair     Assist Is the patient using a wheelchair?: Yes Type of Wheelchair: Manual Wheelchair activity did not occur:  (Sternal precautions)  Wheelchair assist level: Dependent - Patient 0% Max wheelchair distance: 150'    Wheelchair 50 feet with 2 turns activity    Assist    Wheelchair 50 feet with 2 turns activity did not occur: Safety/medical concerns       Wheelchair 150 feet activity     Assist  Wheelchair 150 feet activity did not occur: Safety/medical concerns       Blood pressure (!) 106/51, pulse (!) 57, temperature 98.2 F (36.8 C), resp. rate 17, height 5\' 8"  (1.727 m), weight 49.7 kg, SpO2 99%.  Medical Problem List and Plan: 1. 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  Sternal precautions             -patient may shower -ELOS/Goals: 14-18 days supervision to mod I-- With supervision goals DC 1/25 supervision/goals  - 1/23: Per patient/family request, pursuing SNF placement due to inability of caregivers to provide 24/7 support with ongoing min assist ADLs and cognitive deficits requiring  assistance   -Stable to continue CIR 1/14: Needs to be independent at discharge per SW; son has moved out of state and can maybe provide 1-2 weeks on discharge. Patient's son's GF has provided some support in the hospital. Carryover very poor; Max-total assist with sternal precautions, Min A if able to push up. Difficulty with LBD and toiletting tasks - will forget what he is doing with toileting tasks. Per PT has been needing lots of help at home too. Per SW presenting like dementia cog-wise. Doing well with Dys 3. Will need assistance at discharge; SW to discuss with family.  1-16: Discussed patient dispo requirements with son [name], emphasizing need for full supervision at discharge and anticipating will be greater than  a 1 to 2 months needed.  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  After discussion with social work, family still unsure who will come in for training, will likely provide supervision for 2 weeks at home prior to deciding whether patient needs more oversight. 1/21: At bedtime incontinence per nursing. Per SW family not agreeing to care plan involving [name]/her mother, which was offered; this is consistent with patient's wishes as well. Unsure of dispo plan at this time as family is not offering alternatives.  1-22: Family discussed appealing discharge with social work due to feeling patient is not medically optimized.  Per team, primary barriers remain cognitive, not expected to improve further with additional time.  2.  Antithrombotics: -DVT/anticoagulation:  Pharmaceutical: Lovenox 40mg  daily.  Venous Doppler studies negative -antiplatelet therapy: Aspirin 81 mg daily.  3. Pain Management: Oxycodone as needed--no complaints  4. Mood/Behavior/Sleep: Provide emotional support             - antipsychotic agents: Seroquel 25 mg daily at 1000 and 50 mg nightly   -  Continue current regimen, can add melatonin 5 mg as needed  -1-13: Will start to wean off antipsychotics; DC AM Seroquel for tomorrow 1-15: Patient tolerated well, change nightly Seroquel to as needed--has resulted in somewhat interrupted sleep but so far has been tolerable.  Noel Christmas states he gets anxious at night sometimes regarding dispo issues. 1/17: No PRNs used.  Continue melatonin, will add nightly as needed trazodone 50 mg retime at bedtime medications to 8 PM given this is his normal bedtime--improved 1-20: Family reporting compulsive calls, anxieties in the early evenings between therapies and sleep.  Patient is sleeping well, no hallucinations or signs of delirium at this time.  Likely early sundowning, adding Atarax for anxiety and informed nursing to be aware of these behaviors. 04-03-21: No reports by nursing overnight, no use of as needed Atarax 1-24: Increasing anxiety and confused behavior since family elected going to SNF; encouraged use of PRN Atarax, start BuSpar 5 mg twice daily   5. Neuropsych/cognition: This patient is capable of making decisions on his own behalf.  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  Weaning sedating medications as above.  Will start Aricept 5 mg nightly-theoretical risk of arrhythmia, however on literature review cardiac events matched placebo on meta-analysis. 1-15: Tolerating Aricept well--and noticing some mild cognitive improvements 1/21: Per cognitive evaluation, patient can make reasonable decisions for himself although underlying dementia requires some reorientation and cueing  6. Skin/Wound Care: Routine skin checks. -His right lower extremity harvest site completing course of Ancef for cellulitis --1/8: transitioned to Augmentin 1-8 to complete 10-day course--appearance slightly more edematous/erythematous 1-14, will get WBC in a.m. and  monitor closely, still staying within margins of prior marking so may just be taking a long time to resolve  1-13: DC PICC line--site looks clean. -1-17: Right clavicle staples removed.  Remove abdominal sutures today. - 1/21: RLE swelling/infection resolved 1-22: Dry skin overlying prior infection site; add Eucerin daily--appearance improving  7. Fluids/Electrolytes/Nutrition/hyponatremia: Routine in and outs with follow-up chemistries, continue supplements.  -1-8: Low albumin, protein; continue current supplemental tube feeds as below.  Encourage protein and p.o. intakes.  Very mild hyponatremia, will recheck on Friday. 1-10: Hyponatremia 129, slight downtrend from 130.  Repeat BMP, serum osmol, urine studies in a.m.  to confirm etiology--will be somewhat complicated by use of torsemide.  Placed on 1200 cc fluid restriction; patient is remaining well below this. -03/24/23 Na up to 132, SOsm WNL, urine studies not done yet -03/25/23 Urine Osm still not done... scheduled weekly labs starting Monday - 1/13: Na improved/stable - repeat Wednesday--add magnesium to today's labs--2.1, stable 1-15: NA stable 131; add calcium supplementation, PRN tums, and increase protonic to 40 mg BID for at bedtime nausea 1-17: Added as needed Zofran 4 mg every 8 hours, and miscellaneous nursing order to try and divide out medications and not give all at once crushed in applesauce due to intolerance of foul taste 1-20: Only 1 use of Zofran over the weekend, no recorded emesis --still having nausea around crushed pills only -04/08/23 BMP done yesterday afternoon, Na 132 stable, BUN 47 and Cr 1.44 which is up-- encourage PO fluids today, recheck tomorrow; will hold off on starting fluids just yet.   8.  Acute blood loss anemia.  Follow-up CBC -stable   - HgB improving  9.  COVID-positive.  Completed Paxlovid  -1-17: DC schedule guaifenesin.  10.  Carotid artery stenosis.  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  Seen by vascular surgery Dr Carolynn Sayers and plan outpatient follow-up 1 month  11.  History of VT peri-CABG.  Amiodarone 200 mg BID>>daily, Lanoxin 0.125 mg daily  -Remains with low/stable heart rate -1-14: Patient reporting irregular palpitations during therapies.  None documented, made PT aware so they can document any events during their therapies today.--did not occur per therapies 1/14  12.  Hyperlipidemia.  Lipitor 80mg  daily 13.  Diastolic congestive heart failure.  Torsemide 20 mg daily, Cozaar 25 mg daily, Aldactone 25 mg daily. Dapagliflozin 10mg  daily. Monitor for any signs of fluid overload.  Daily weights.  1-24: Weights with ongoing downtrend, per record patient has adequate p.o. intakes and compliant with nutrition shakes between meals, now with reports of orthostasis and blood pressure 80 over 50s this afternoon.  Reduce torsemide to 10 mg daily, and encourage p.o. fluids. - Orthostatic vitals in AM ordered -1/25-26/25 wt fairly stable; monitor Filed Weights   04/06/23 0529 04/07/23 0500 04/08/23 0501  Weight: 48.6 kg 49.6 kg 49.7 kg    14.  Sacral wound.  Wound care nurse follow-up.  Covered with foam dressing -1-16: Caught himself on the back during a toileting transfer today per nursing, resulting in small wound, covered in barrier dressing.  15.  Dysphagia.  Dysphagia #2 thin liquids.  Nasogastric tube for nutritional support. Just upgraded diet- might not need Cortrak long -Continue supplemental tube feeds as above, encourage p.o. intakes, protein. -1-10: Eating 60 to 90% of meals per documentation.  Discontinue core track, tube feeds.--Continuing to eat well  16. Stool regimen: colace 200mg  daily  -1-17: DC liquid Colace, having regular bowel movements  -03/31/23 no BM in 3 days, will restart colace at 100mg  daily for now.  -04/01/23 pt states BM yesterday but none documented since 1/15, nurse documentation states LBM 1/16, unclear which is correct; asked  nursing to please document if he's had BM today, if none today then would advance bowel regimen --1-20: Increase Colace to 100 mg twice daily, add Senokot S1 tab at night  -1/24 LBM  -04/07/23 no good BM since 1/21, just a smear on 1/24; hard palpable stool in lower abdomen, pt feels it in rectum; will do sorbitol then mineral oil enema. Hopefully this improves it.  -04/08/23 multiple BMs yesterday  with enema/meds, feels better; cont regimen  17. Thrombocytosis.  resolved   LOS: 19 days A FACE TO FACE EVALUATION WAS PERFORMED  [id number] [place] 04/08/2023, 1:49 PM

## 2023-04-09 ENCOUNTER — Other Ambulatory Visit: Payer: Self-pay | Admitting: Thoracic Surgery (Cardiothoracic Vascular Surgery)

## 2023-04-09 DIAGNOSIS — I2510 Atherosclerotic heart disease of native coronary artery without angina pectoris: Secondary | ICD-10-CM

## 2023-04-09 LAB — CBC
HCT: 42.1 % (ref 39.0–52.0)
Hemoglobin: 13.5 g/dL (ref 13.0–17.0)
MCH: 28.2 pg (ref 26.0–34.0)
MCHC: 32.1 g/dL (ref 30.0–36.0)
MCV: 87.9 fL (ref 80.0–100.0)
Platelets: 216 10*3/uL (ref 150–400)
RBC: 4.79 MIL/uL (ref 4.22–5.81)
RDW: 17.2 % — ABNORMAL HIGH (ref 11.5–15.5)
WBC: 7.1 10*3/uL (ref 4.0–10.5)
nRBC: 0 % (ref 0.0–0.2)

## 2023-04-09 LAB — BASIC METABOLIC PANEL
Anion gap: 12 (ref 5–15)
BUN: 46 mg/dL — ABNORMAL HIGH (ref 8–23)
CO2: 26 mmol/L (ref 22–32)
Calcium: 9.2 mg/dL (ref 8.9–10.3)
Chloride: 91 mmol/L — ABNORMAL LOW (ref 98–111)
Creatinine, Ser: 1.27 mg/dL — ABNORMAL HIGH (ref 0.61–1.24)
GFR, Estimated: 58 mL/min — ABNORMAL LOW (ref >60)
Glucose, Bld: 76 mg/dL (ref 70–99)
Potassium: 5 mmol/L (ref 3.5–5.1)
Sodium: 129 mmol/L — ABNORMAL LOW (ref 135–145)

## 2023-04-09 MED ORDER — SODIUM CHLORIDE 0.9 % IV SOLN: INTRAVENOUS | Status: AC

## 2023-04-09 NOTE — Progress Notes (Signed)
Speech Language Pathology Daily Session Note  Patient Details  Name: Walter Vargas MRN: [id number] Date of Birth: [birth year]  Today's Date: 04/09/2023 SLP Individual Time: 1311-1353 SLP Individual Time Calculation (min): 42 min  Short Term Goals: Week 3: SLP Short Term Goal 1 (Week 3): Patient will orient to date and time using available external aids with mod assist. SLP Short Term Goal 2 (Week 3): Patient will utilize external memory aids to recall daily information with 50% accuracy given mod assist. SLP Short Term Goal 3 (Week 3): Patient will demonstrate intellectual awareness of deficits as evidenced by stating 2 cognitive and 2 physical deficits with max assist. SLP Short Term Goal 4 (Week 3): Patient will solve basic environmental problems with 75% accuracy given mod assist. SLP Short Term Goal 5 (Week 3): Patient will recall and utilize swallow safety strategy of intermittent throat clear during assessment of least restrictive diet tolerance 90% of the time given mod assist.  Skilled Therapeutic Interventions: SLP conducted skilled therapy session targeting cognitive retraining and dysphagia management goals. Patient originally not agreeable to speech therapy but eventually agreeable to participating in various functional tasks in the environment. Patient assisted SLP with placement of dinner order and benefited from min cues to scan and identify number for kitchen. Patient solved basic environmental problems with 75% accuracy throughout session given mod assist. Patient participated in trials of regular solids and thin liquids. No overt s/sx of penetration/aspiration with solids. Patient demonstrated cough x1 with thin liquids, however SLP noted patient took a sip while partially reclined. SLP encouraged repositioning into upright position with no further s/sx of aspiration. Recommend regular/thin liquids with full supervision. Maintain fully upright position during meals and snacks.  Continue with medications crushed in puree per patient preference. Patient was left in lowered bed with call bell in reach and bed alarm set. SLP will continue to target goals per plan of care.      Pain Pain Assessment Pain Scale: 0-10 Pain Score: 0-No pain  Therapy/Group: Individual Therapy  Jeannie Done, M.A., CCC-SLP  Yetta Barre 04/09/2023, 3:49 PM

## 2023-04-09 NOTE — Progress Notes (Signed)
PROGRESS NOTE   Subjective/Complaints:  No acute complaints.  No events overnight. Reporting no further episodes of dizziness/orthostasis as nlast week, but cognition remains poor.  Vitals hypotensive but relatively stable.  Intake slightly positive for the last few days, but relatively low. A.m. labs significant for hyponatremia 129, BUN/creatinine slightly improved from 1.44-1.27.  ROS: Denies fevers, chills,  abd pain, vomiting, diarrhea, SOB, cough, chest pain, new weakness or paraesthesias.   + Nausea w/ pills crushed in food--ongoing, slightly improved + anxiety--ongoing, worsening + Right lower extremity dry skin-stable +constipation-resolved  Objective:   No results found.   Recent Labs    04/09/23 0600  WBC 7.1  HGB 13.5  HCT 42.1  PLT 216      Recent Labs    04/07/23 1532 04/09/23 0600  NA 132* 129*  K 4.5 5.0  CL 92* 91*  CO2 25 26  GLUCOSE 101* 76  BUN 47* 46*  CREATININE 1.44* 1.27*  CALCIUM 9.6 9.2      Intake/Output Summary (Last 24 hours) at 04/09/2023 0836 Last data filed at 04/09/2023 0720 Gross per 24 hour  Intake 840 ml  Output 300 ml  Net 540 ml      Pressure Injury 03/15/23 Coccyx Mid;Lower Stage 2 -  Partial thickness loss of dermis presenting as a shallow open injury with a red, pink wound bed without slough. (Active)  03/15/23 0800  Location: Coccyx  Location Orientation: Mid;Lower  Staging: Stage 2 -  Partial thickness loss of dermis presenting as a shallow open injury with a red, pink wound bed without slough.  Wound Description (Comments):   Present on Admission: Yes    Physical Exam: Vital Signs Blood pressure (!) 118/56, pulse (!) 55, temperature 97.7 F (36.5 C), resp. rate 17, height 5\' 8"  (1.727 m), weight 48 kg, SpO2 100%.   Constitution: Appropriate appearance for age. No apparent distress.  Sitting in therapy gym.  Resp:   clear to auscultation  bilaterally.  On room air, no respiratory distress. Cardio: Regular rate, reg rhythm. Well perfused appearance.  Chronic bilateral lower extremity skin changes.No edema.  Abdomen: soft, Nondistended. Nontender, no further stool burden felt.  +BS throughout, normoactive today.   Psych: Appropriate mood and affect.  Mildly anxious. Skin:   + Right inner thigh/calf with scabbing from vein harvest site almost completely healed  MSK: Severe lumbar levoscoliosis.  Neurologic Exam:  Awake, alert, and oriented to self, place; not time + Moderate memory and higher cognitive deficits  - ongoing  Follows all simple commands. Cranial nerves II through XII intact.  Sensation intact.  No fine motor deficits.  No ataxia. Insight: Fair insight into current condition Strength: Antigravity against resistance all 4 extremities grossly 5- out of 5 throughout - unchanged 1/27   Assessment/Plan: 1. Functional deficits which require 3+ hours per day of interdisciplinary therapy in a comprehensive inpatient rehab setting. Physiatrist is providing close team supervision and 24 hour management of active medical problems listed below. Physiatrist and rehab team continue to assess barriers to discharge/monitor patient progress toward functional and medical goals  Care Tool:  Bathing    Body parts bathed by patient: Right arm, Left arm,  Chest, Abdomen, Front perineal area, Buttocks, Right upper leg, Left upper leg, Face, Right lower leg, Left lower leg   Body parts bathed by helper: Right lower leg, Left lower leg     Bathing assist Assist Level: Contact Guard/Touching assist     Upper Body Dressing/Undressing Upper body dressing   What is the patient wearing?: Pull over shirt    Upper body assist Assist Level: Contact Guard/Touching assist    Lower Body Dressing/Undressing Lower body dressing      What is the patient wearing?: Pants, Underwear/pull up     Lower body assist Assist for lower body  dressing: Minimal Assistance - Patient > 75%     Toileting Toileting    Toileting assist Assist for toileting: Moderate Assistance - Patient 50 - 74%     Transfers Chair/bed transfer  Transfers assist     Chair/bed transfer assist level: Minimal Assistance - Patient > 75%     Locomotion Ambulation   Ambulation assist      Assist level: 2 helpers Assistive device: No Device Max distance: 5'   Walk 10 feet activity   Assist  Walk 10 feet activity did not occur: Safety/medical concerns  Assist level:  (able to ambulate with RW, but at basline did not use an AD)     Walk 50 feet activity   Assist Walk 50 feet with 2 turns activity did not occur: Safety/medical concerns         Walk 150 feet activity   Assist Walk 150 feet activity did not occur: Safety/medical concerns         Walk 10 feet on uneven surface  activity   Assist Walk 10 feet on uneven surfaces activity did not occur: Safety/medical concerns         Wheelchair     Assist Is the patient using a wheelchair?: Yes Type of Wheelchair: Manual Wheelchair activity did not occur:  (Sternal precautions)  Wheelchair assist level: Dependent - Patient 0% Max wheelchair distance: 150'    Wheelchair 50 feet with 2 turns activity    Assist    Wheelchair 50 feet with 2 turns activity did not occur: Safety/medical concerns       Wheelchair 150 feet activity     Assist  Wheelchair 150 feet activity did not occur: Safety/medical concerns       Blood pressure (!) 118/56, pulse (!) 55, temperature 97.7 F (36.5 C), resp. rate 17, height 5\' 8"  (1.727 m), weight 48 kg, SpO2 100%.  Medical Problem List and Plan: 1. 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  Sternal precautions             -patient may shower -ELOS/Goals: 14-18 days supervision to mod I-- With supervision goals  DC 1/25 supervision/goals  - 1/23: Per patient/family request, pursuing SNF placement due to inability of caregivers to provide 24/7 support with ongoing min assist ADLs and cognitive deficits requiring assistance   -Stable to continue CIR 1/14: Needs to be independent at discharge per SW; son has moved out of state and can maybe provide 1-2 weeks on discharge. Patient's son's GF has provided some support in the hospital. Carryover very poor; Max-total assist with sternal precautions, Min A if able to push up. Difficulty with LBD and toiletting tasks - will forget what he is doing with toileting tasks. Per PT has been needing lots of help at home too. Per SW presenting like dementia cog-wise. Doing  well with Dys 3. Will need assistance at discharge; SW to discuss with family.  1-16: Discussed patient dispo requirements with son [name], emphasizing need for full supervision at discharge and anticipating will be greater than a 1 to 2 months needed.  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  After discussion with social work, family still unsure who will come in for training, will likely provide supervision for 2 weeks at home prior to deciding whether patient needs more oversight. 1/21: At bedtime incontinence per nursing. Per SW family not agreeing to care plan involving [name]/her mother, which was offered; this is consistent with patient's wishes as well. Unsure of dispo plan at this time as family is not offering alternatives.  1-22: Family discussed appealing discharge with social work due to feeling patient is not medically optimized.  Per team, primary barriers remain cognitive, not expected to improve further with additional time.  2.  Antithrombotics: -DVT/anticoagulation:  Pharmaceutical: Lovenox 40mg  daily.  Venous Doppler studies negative -antiplatelet therapy: Aspirin 81 mg  daily.  3. Pain Management: Oxycodone as needed--no complaints  4. Mood/Behavior/Sleep: Provide emotional support             - antipsychotic agents: Seroquel 25 mg daily at 1000 and 50 mg nightly   - Continue current regimen, can add melatonin 5 mg as needed  -1-13: Will start to wean off antipsychotics; DC AM Seroquel for tomorrow 1-15: Patient tolerated well, change nightly Seroquel to as needed--has resulted in somewhat interrupted sleep but so far has been tolerable.  Noel Christmas states he gets anxious at night sometimes regarding dispo issues. 1/17: No PRNs used.  Continue melatonin, will add nightly as needed trazodone 50 mg retime at bedtime medications to 8 PM given this is his normal bedtime--improved 1-20: Family reporting compulsive calls, anxieties in the early evenings between therapies and sleep.  Patient is sleeping well, no hallucinations or signs of delirium at this time.  Likely early sundowning, adding Atarax for anxiety and informed nursing to be aware of these behaviors. 04-03-21: No reports by nursing overnight, no use of as needed Atarax 1-24: Increasing anxiety and confused behavior since family elected going to SNF; encouraged use of PRN Atarax, start BuSpar 5 mg twice daily   5. Neuropsych/cognition: This patient is capable of making decisions on his own behalf.  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  Weaning sedating medications as above.  Will start Aricept 5 mg nightly-theoretical risk of arrhythmia, however on literature review cardiac events matched placebo on meta-analysis. 1-15: Tolerating Aricept well--and noticing some mild cognitive improvements 1/21: Per cognitive evaluation, patient can make reasonable decisions for himself although underlying dementia requires some reorientation and cueing  6. Skin/Wound Care: Routine skin checks. -His right lower  extremity harvest site completing course of Ancef for cellulitis --1/8: transitioned to Augmentin 1-8 to complete 10-day course--appearance slightly more edematous/erythematous 1-14, will get WBC in a.m. and monitor closely, still staying within margins of prior marking so may just be taking a long time to resolve  1-13: DC PICC line--site looks clean. -1-17: Right clavicle staples removed.  Remove abdominal sutures today. - 1/21: RLE swelling/infection resolved 1-22: Dry skin overlying prior infection site; add Eucerin daily--appearance improving  7. Fluids/Electrolytes/Nutrition/hyponatremia: Routine in and outs with follow-up chemistries, continue supplements.  -1-8: Low albumin, protein; continue  current supplemental tube feeds as below.  Encourage protein and p.o. intakes.  Very mild hyponatremia, will recheck on Friday. 1-10: Hyponatremia 129, slight downtrend from 130.  Repeat BMP, serum osmol, urine studies in a.m. to confirm etiology--will be somewhat complicated by use of torsemide.  Placed on 1200 cc fluid restriction; patient is remaining well below this. -03/24/23 Na up to 132, SOsm WNL, urine studies not done yet -03/25/23 Urine Osm still not done... scheduled weekly labs starting Monday - 1/13: Na improved/stable - repeat Wednesday--add magnesium to today's labs--2.1, stable 1-15: NA stable 131; add calcium supplementation, PRN tums, and increase protonic to 40 mg BID for at bedtime nausea 1-17: Added as needed Zofran 4 mg every 8 hours, and miscellaneous nursing order to try and divide out medications and not give all at once crushed in applesauce due to intolerance of foul taste 1-20: Only 1 use of Zofran over the weekend, no recorded emesis --still having nausea around crushed pills only -04/08/23 BMP done yesterday afternoon, Na 132 stable, BUN 47 and Cr 1.44 which is up-- encourage PO fluids today, recheck tomorrow; will hold off on starting fluids just yet.  1-27: Sodium remains  downtrending 129, BUN and creatinine slightly improved.  P.o. intakes appear low Is.  Start 500 cc IV fluids today, repeat in AM.  8.  Acute blood loss anemia.  Follow-up CBC -stable   - HgB improving  9.  COVID-positive.  Completed Paxlovid  -1-17: DC schedule guaifenesin.  10.  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  Seen by vascular surgery Dr Carolynn Sayers and plan outpatient follow-up 1 month  11.  History of VT peri-CABG.  Amiodarone 200 mg BID>>daily, Lanoxin 0.125 mg daily  -Remains with low/stable heart rate -1-14: Patient reporting irregular palpitations during therapies.  None documented, made PT aware so they can document any events during their therapies today.--did not occur per therapies 1/14  12.  Hyperlipidemia.  Lipitor 80mg  daily 13.  Diastolic congestive heart failure.  Torsemide 20 mg daily, Cozaar 25 mg daily, Aldactone 25 mg daily. Dapagliflozin 10mg  daily. Monitor for any signs of fluid overload.  Daily weights.  1-24: Weights with ongoing downtrend, per record patient has adequate p.o. intakes and compliant with nutrition shakes between meals, now with reports of orthostasis and blood pressure 80 over 50s this afternoon.  Reduce torsemide to 10 mg daily, and encourage p.o. fluids. - Orthostatic vitals in AM ordered -1/25-26/25 wt fairly stable; monitor 1/27: Increasingly  low blood pressure, will DC torsemide today and encourage p.o. fluids, initiate 500 cc IV fluids at 50 cc/h today.  BMP in AM. Filed Weights   04/07/23 0500 04/08/23 0501 04/09/23 0600  Weight: 49.6 kg 49.7 kg 48 kg    14.  Sacral wound.  Wound care nurse follow-up.  Covered with foam dressing -1-16: Caught himself on the back during a toileting transfer today per nursing, resulting in small wound, covered in barrier dressing.  15.  Dysphagia.  Dysphagia #2 thin liquids.  Nasogastric tube for nutritional support. Just upgraded diet- might not need Cortrak  long -Continue supplemental tube feeds as above, encourage p.o. intakes, protein. -1-10: Eating 60 to 90% of meals per documentation.  Discontinue core track, tube feeds.--Continuing to eat well  16. Stool regimen: colace 200mg  daily  -1-17: DC liquid Colace, having regular bowel movements  -03/31/23 no BM in 3 days, will restart colace at 100mg  daily for now.  -04/01/23 pt states BM  yesterday but none documented since 1/15, nurse documentation states LBM 1/16, unclear which is correct; asked nursing to please document if he's had BM today, if none today then would advance bowel regimen --1-20: Increase Colace to 100 mg twice daily, add Senokot S1 tab at night  -1/24 LBM  -04/07/23 no good BM since 1/21, just a smear on 1/24; hard palpable stool in lower abdomen, pt feels it in rectum; will do sorbitol then mineral oil enema. Hopefully this improves it.  -04/08/23 multiple BMs yesterday with enema/meds, feels better; cont regimen  17. Thrombocytosis.  resolved   LOS: 20 days A FACE TO FACE EVALUATION WAS PERFORMED  Angelina Sheriff 04/09/2023, 8:36 AM

## 2023-04-09 NOTE — Progress Notes (Signed)
Physical Therapy Weekly Progress Note  Patient Details  Name: Walter Vargas MRN: [id number] Date of Birth: 31-Mar-1945  Beginning of progress report period: March 30, 2023 End of progress report period: April 09, 2023  Today's Date: 04/09/2023 PT Individual Time: 0802-0914 PT Individual Time Calculation (min): 72 min   Patient has met 0 of 0 short term goals.  Pt did not have any short term goals set due to anticipated length of stay being extended due to alteration of pt's discharge disposition. Pt is now planning to DC to a SNF. Pt has made good progress toward mobility goals, however, improving independence with bed mobility, balance, transfers, and ambulation. Pt has most difficulty with cognitive tasks and is most limited by cognition. Pt will continue to benefit from skilled PT to optimize functional mobility until placement.   Patient continues to demonstrate the following deficits muscle weakness, decreased cardiorespiratoy endurance, decreased awareness, decreased problem solving, decreased safety awareness, and decreased memory, and decreased standing balance, decreased postural control, and decreased balance strategies and therefore will continue to benefit from skilled PT intervention to increase functional independence with mobility.  Patient progressing toward long term goals..  Continue plan of care.  PT Short Term Goals Week 2:  PT Short Term Goal 1 (Week 2): STGs = LTGs Week 3:  PT Short Term Goal 1 (Week 3): STGs = LTGs  Skilled Therapeutic Interventions/Progress Updates:  Community reintegration;Ambulation/gait training;DME/adaptive equipment instruction;Neuromuscular re-education;Psychosocial support;Stair training;UE/LE Strength taining/ROM;Balance/vestibular training;Functional electrical stimulation;Therapeutic Activities;UE/LE Coordination activities;Pain management;Skin care/wound management;Discharge planning;Cognitive remediation/compensation;Disease  management/prevention;Patient/family education;Functional mobility training;Splinting/orthotics;Therapeutic Exercise;Visual/perceptual remediation/compensation  Pt received seated in WC and agrees to therapy. Mentions some pain in Rt side. PT provides rest breaks and gentle mobility to manage pain. WC transport to gym for time management. Pt performs sit to stand multiple times during session with close supervision and much improved efficiency and sequencing relative to previous sessions with this therapist. Pt ambulates 3x175' with RW and cues for upright posture and increased proximity to RW for safety and improved balance. Seated rest breaks between bouts.   Pt attempts standing hip abduction with red theraband for resistance but is unable to complete sufficient ROM for benefits. Theraband removed and pt performs sets of x10 standing hip abduction to strengthen hip abductors and challenge dynamic standing balance. PT provides verbal and tactile cues for correct perforanace. Pt performs 3x10 with seated rest breaks. Pt ambulates back to room with RW and similar cues for safety. Pt performs toilet transfer with CGA and cues for safe positioning. Pt ambulates back to bed with RW and cues for sequencing and AD management. Left with alarm intact and all needs within reach.    Therapy Documentation Precautions:  Precautions Precautions: Fall, Sternal, Other (comment) Precaution Booklet Issued: No Precaution Comments: cortrak, sternal precautions Restrictions Weight Bearing Restrictions Per Provider Order: No RUE Weight Bearing Per Provider Order: Weight bearing as tolerated LUE Weight Bearing Per Provider Order: Weight bearing as tolerated   Therapy/Group: Individual Therapy  Beau Fanny, PT, DPT 04/09/2023, 4:37 PM

## 2023-04-09 NOTE — Progress Notes (Signed)
Patient ID: Walter Vargas, male   DOB: 1946/01/25, 78 y.o.   MRN: [id number]  1334- Sw spoke with [facility]/Admissions with [facility] and waiting on follow-up.  *referral declined in Epic. SW will explore other SNF options.  [name], MSW, LCSW Office: [phone] Cell: [phone] Fax: [phone]

## 2023-04-09 NOTE — Progress Notes (Signed)
Occupational Therapy Session Note  Patient Details  Name: Walter Vargas MRN: [id number] Date of Birth: [birth year]  Session 1  Today's Date: 04/09/2023 OT Individual Time: 1015-1100 OT Individual Time Calculation (min): 45 min  and Today's Date: 04/09/2023 OT Missed Time: 15 Minutes Missed Time Reason: Patient unwilling/refused to participate without medical reason   Session 2 Today's Date: 04/09/2023 OT Individual Time: 1500-1525 OT Individual Time Calculation (min): 25 min    Short Term Goals: Week 3:  OT Short Term Goal 1 (Week 3): Pt will complete toileting tasks with min A OT Short Term Goal 2 (Week 3): Pt will utilize external cues to properly place hand on w/c during sit > stand OT Short Term Goal 3 (Week 3): Pt will complete full ADL routine with no more than 1 rest break to demo improved activity tolerance  Skilled Therapeutic Interventions/Progress Updates:   Session 1 Pt received supine with no c/o pain, initially declining participation, perseverative on having IV in his R UE. OT gave him a brief break and then re-entered room and was able to provide encouragement to participate, stating benefits of muscle mass and DVT prevention. He required extra time to initiate all activity. He came to EOB with (S). He reported feeling woozy so OT checked BP- required 3x as pt could not sustain attention to task and frequently would begin moving. 113/48. Sit > stand with CGA from EOB with the RW. He completed 200 ft of functional mobility at [facility] level- poor awareness and sustained attention to task, often almost running into obstacles in the hallway. He also required skilled cueing for use of rest break as he became hunched over RW. He took a seated rest break before stepping onto scale to weigh him- 107.4 lbs. Discussed need for continued PO intake per dietician and MD recommendation. He completed two more bouts of functional mobility- 200 ft, to increase functional activity tolerance and  dynamic standing balance with the RW. He returned to his room and was left supine with all needs met, bed alarm set. 15 min missed d/t initial pt refusal.    Session 2 Pt received supine with no c/o pain, agreeable to OT session. He requested assist to send text to his family friend [name].Typed out message per his request. He requested to shave. He came to EOB with (S). He stood with CGA using the RW, min cueing for hand placement. He completed functional mobility to the sink with the RW with CGA. He remained standing for ~10 min for OT to assist with shaving his face using electric razor. He also completed some of the shaving himself. He took a seated rest break before standing again. He returned to supine in bed and was left supine with all needs met, bed alarm set.    Therapy Documentation Precautions:  Precautions Precautions: Fall, Sternal, Other (comment) Precaution Booklet Issued: No Precaution Comments: cortrak, sternal precautions Restrictions Weight Bearing Restrictions Per Provider Order: No RUE Weight Bearing Per Provider Order: Weight bearing as tolerated LUE Weight Bearing Per Provider Order: Weight bearing as tolerated   Therapy/Group: Individual Therapy  [name] 04/09/2023, 6:16 AM

## 2023-04-10 ENCOUNTER — Ambulatory Visit: Payer: 59 | Admitting: Thoracic Surgery (Cardiothoracic Vascular Surgery)

## 2023-04-10 LAB — BASIC METABOLIC PANEL
Anion gap: 10 (ref 5–15)
BUN: 36 mg/dL — ABNORMAL HIGH (ref 8–23)
CO2: 26 mmol/L (ref 22–32)
Calcium: 9.2 mg/dL (ref 8.9–10.3)
Chloride: 95 mmol/L — ABNORMAL LOW (ref 98–111)
Creatinine, Ser: 1.05 mg/dL (ref 0.61–1.24)
GFR, Estimated: >60 mL/min (ref >60)
Glucose, Bld: 74 mg/dL (ref 70–99)
Potassium: 5.1 mmol/L (ref 3.5–5.1)
Sodium: 131 mmol/L — ABNORMAL LOW (ref 135–145)

## 2023-04-10 MED ORDER — MIRTAZAPINE 15 MG PO TABS: 7.5000 mg | ORAL_TABLET | Freq: Every day | ORAL | Status: DC

## 2023-04-10 MED ORDER — MIRTAZAPINE 15 MG PO TABS
7.5000 mg | ORAL_TABLET | Freq: Every day | ORAL | Status: DC
  Filled 2023-04-10 (×2): qty 1

## 2023-04-10 NOTE — Progress Notes (Signed)
PROGRESS NOTE   Subjective/Complaints:  No acute complaints.  No events overnight.  Sodium improved with IV fluid yesterday to 131, BUN/creatinine down to 36/1.05. Ongoing mild bradycardia and diastolic hypotension Incontinent of bowel and bladder overnight, had multiple small bowel movements  On exam, patient is working with SLP.  On physician entering the room, he explains that he feels nauseous and asks to lay back.  States that PT earlier was very difficult for him to get through; after discussion discussing with PT, no notable concerns along their evaluation today.  He does continue to have difficulty with taking pills either crushed or whole.  ROS: Denies fevers, chills,  abd pain, vomiting, diarrhea, SOB, cough, chest pain, new weakness or paraesthesias.   + Nausea w/ pills crushed in food--ongoing, slightly improved + anxiety--ongoing Objective:   No results found.   Recent Labs    04/09/23 0600  WBC 7.1  HGB 13.5  HCT 42.1  PLT 216      Recent Labs    04/09/23 0600 04/10/23 0504  NA 129* 131*  K 5.0 5.1  CL 91* 95*  CO2 26 26  GLUCOSE 76 74  BUN 46* 36*  CREATININE 1.27* 1.05  CALCIUM 9.2 9.2      Intake/Output Summary (Last 24 hours) at 04/10/2023 0948 Last data filed at 04/10/2023 2536 Gross per 24 hour  Intake 472 ml  Output --  Net 472 ml      Pressure Injury 03/15/23 Coccyx Mid;Lower Stage 2 -  Partial thickness loss of dermis presenting as a shallow open injury with a red, pink wound bed without slough. (Active)  03/15/23 0800  Location: Coccyx  Location Orientation: Mid;Lower  Staging: Stage 2 -  Partial thickness loss of dermis presenting as a shallow open injury with a red, pink wound bed without slough.  Wound Description (Comments):   Present on Admission: Yes    Physical Exam: Vital Signs Blood pressure (!) 105/55, pulse (!) 53, temperature (!) 97.5 F (36.4 C), temperature  source Oral, resp. rate 18, height 5\' 8"  (1.727 m), weight 48.8 kg, SpO2 98%.   Constitution: Appropriate appearance for age. No apparent distress.  Laying in bed.  Cachectic appearance.  Resp:   clear to auscultation bilaterally.  On room air, no respiratory distress. Cardio: Regular rate, reg rhythm. Well perfused appearance.  Chronic bilateral lower extremity skin changes.No edema.  Abdomen: soft, Nondistended. Nontender, no further stool burden felt.  +BS throughout, normoactive today.   Psych: Appropriate mood and affect.  Mildly anxious. Skin:   + Right inner thigh/calf with scabbing from vein harvest site almost completely healed  MSK: Severe lumbar levoscoliosis.  Neurologic Exam:  Awake, alert, and oriented to self, place; not time + Moderate memory and higher cognitive deficits  - ongoing  Follows all simple commands. Cranial nerves II through XII intact.  Sensation intact.  No fine motor deficits.  No ataxia. Insight: Fair insight into current condition Strength: Antigravity against resistance all 4 extremities grossly 5- out of 5 throughout - unchanged 1/28   Assessment/Plan: 1. Functional deficits which require 3+ hours per day of interdisciplinary therapy in a comprehensive inpatient rehab setting. Physiatrist is  providing close team supervision and 24 hour management of active medical problems listed below. Physiatrist and rehab team continue to assess barriers to discharge/monitor patient progress toward functional and medical goals  Care Tool:  Bathing    Body parts bathed by patient: Right arm, Left arm, Chest, Abdomen, Front perineal area, Buttocks, Right upper leg, Left upper leg, Face, Right lower leg, Left lower leg   Body parts bathed by helper: Right lower leg, Left lower leg     Bathing assist Assist Level: Contact Guard/Touching assist     Upper Body Dressing/Undressing Upper body dressing   What is the patient wearing?: Pull over shirt    Upper  body assist Assist Level: Contact Guard/Touching assist    Lower Body Dressing/Undressing Lower body dressing      What is the patient wearing?: Pants, Underwear/pull up     Lower body assist Assist for lower body dressing: Minimal Assistance - Patient > 75%     Toileting Toileting    Toileting assist Assist for toileting: Moderate Assistance - Patient 50 - 74%     Transfers Chair/bed transfer  Transfers assist     Chair/bed transfer assist level: Minimal Assistance - Patient > 75%     Locomotion Ambulation   Ambulation assist      Assist level: 2 helpers Assistive device: No Device Max distance: 5'   Walk 10 feet activity   Assist  Walk 10 feet activity did not occur: Safety/medical concerns  Assist level:  (able to ambulate with RW, but at basline did not use an AD)     Walk 50 feet activity   Assist Walk 50 feet with 2 turns activity did not occur: Safety/medical concerns         Walk 150 feet activity   Assist Walk 150 feet activity did not occur: Safety/medical concerns         Walk 10 feet on uneven surface  activity   Assist Walk 10 feet on uneven surfaces activity did not occur: Safety/medical concerns         Wheelchair     Assist Is the patient using a wheelchair?: Yes Type of Wheelchair: Manual Wheelchair activity did not occur:  (Sternal precautions)  Wheelchair assist level: Dependent - Patient 0% Max wheelchair distance: 150'    Wheelchair 50 feet with 2 turns activity    Assist    Wheelchair 50 feet with 2 turns activity did not occur: Safety/medical concerns       Wheelchair 150 feet activity     Assist  Wheelchair 150 feet activity did not occur: Safety/medical concerns       Blood pressure (!) 105/55, pulse (!) 53, temperature (!) 97.5 F (36.4 C), temperature source Oral, resp. rate 18, height 5\' 8"  (1.727 m), weight 48.8 kg, SpO2 98%.  Medical Problem List and Plan: 1. 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  Sternal precautions             -patient may shower -ELOS/Goals: 14-18 days supervision to mod I-- Placement/TBD  - 1/23: Per patient/family request, pursuing SNF placement due to inability of caregivers to provide 24/7 support with ongoing min assist ADLs and cognitive deficits requiring assistance   -Stable to continue CIR 1/14: Needs to be independent at discharge per SW; son has moved out of state and can maybe provide 1-2 weeks on discharge. Patient's son's GF has provided some support in the hospital. Carryover very  poor; Max-total assist with sternal precautions, Min A if able to push up. Difficulty with LBD and toiletting tasks - will forget what he is doing with toileting tasks. Per PT has been needing lots of help at home too. Per SW presenting like dementia cog-wise. Doing well with Dys 3. Will need assistance at discharge; SW to discuss with family.  1-16: Discussed patient dispo requirements with son [name], emphasizing need for full supervision at discharge and anticipating will be greater than a 1 to 2 months needed.  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  After discussion with social work, family still unsure who will come in for training, will likely provide supervision for 2 weeks at home prior to deciding whether patient needs more oversight. 1/21: At bedtime incontinence per nursing. Per SW family not agreeing to care plan involving [name]/her mother, which was offered; this is consistent with patient's wishes as well. Unsure of dispo plan at this time as family is not offering alternatives.  1-22: Family discussed appealing discharge with social work due to feeling patient is not medically optimized.  Per team, primary barriers remain  cognitive, not expected to improve further with additional time. 1/28: Per SLP, patient panicked with whole medications even though he complains of crushed; he prefers them to be "dust". Markham Jordan is coming next week. Upgraded to regular diet. Ongoing evening behaviors.   2.  Antithrombotics: -DVT/anticoagulation:  Pharmaceutical: Lovenox 40mg  daily.  Venous Doppler studies negative -antiplatelet therapy: Aspirin 81 mg daily.  3. Pain Management: Oxycodone as needed--no complaints  4. Mood/Behavior/Sleep: Provide emotional support             - antipsychotic agents: Seroquel 25 mg daily at 1000 and 50 mg nightly   - Continue current regimen, can add melatonin 5 mg as needed  -1-13: Will start to wean off antipsychotics; DC AM Seroquel for tomorrow 1-15: Patient tolerated well, change nightly Seroquel to as needed--has resulted in somewhat interrupted sleep but so far has been tolerable.  Noel Christmas states he gets anxious at night sometimes regarding dispo issues. 1/17: No PRNs used.  Continue melatonin, will add nightly as needed trazodone 50 mg retime at bedtime medications to 8 PM given this is his normal bedtime--improved 1-20: Family reporting compulsive calls, anxieties in the early evenings between therapies and sleep.  Patient is sleeping well, no hallucinations or signs of delirium at this time.  Likely early sundowning, adding Atarax for anxiety and informed nursing to be aware of these behaviors. 04-03-21: No reports by nursing overnight, no use of as needed Atarax 1-24: Increasing anxiety and confused behavior since family elected going to SNF; encouraged use of PRN Atarax, start BuSpar 5 mg twice daily 1-28: Adding mirtazapine 7.5 mg nightly for sleep, appetite stimulation, and ongoing anxiety reported by staff.   5. Neuropsych/cognition: This patient is capable of making decisions on his own behalf.  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  Weaning sedating medications as above.  Will start Aricept 5 mg nightly-theoretical risk of arrhythmia, however on literature review cardiac events matched placebo on meta-analysis. 1-15: Tolerating Aricept well--and noticing some mild cognitive improvements 1/21: Per cognitive evaluation, patient can make reasonable decisions for himself although underlying dementia requires some reorientation and cueing  6. Skin/Wound Care: Routine skin checks. -His right lower extremity harvest  site completing course of Ancef for cellulitis --1/8: transitioned to Augmentin 1-8 to complete 10-day course--appearance slightly more edematous/erythematous 1-14, will get WBC in a.m. and monitor closely, still staying within margins of prior marking so may just be taking a long time to resolve  1-13: DC PICC line--site looks clean. -1-17: Right clavicle staples removed.  Remove abdominal sutures today. - 1/21: RLE swelling/infection resolved 1-22: Dry skin overlying prior infection site; add Eucerin daily--appearance improving  7. Fluids/Electrolytes/Nutrition/hyponatremia: Routine in and outs with follow-up chemistries, continue supplements.  -1-8: Low albumin, protein; continue current supplemental tube feeds as below.  Encourage protein and p.o. intakes.  Very mild hyponatremia, will recheck on Friday. 1-10: Hyponatremia 129, slight downtrend from 130.  Repeat BMP, serum osmol, urine studies in a.m. to confirm etiology--will be somewhat complicated by use of torsemide.  Placed on 1200 cc fluid restriction; patient is remaining well below this. -03/24/23 Na up to 132, SOsm WNL, urine studies not done yet -03/25/23 Urine Osm still not done... scheduled weekly labs starting Monday - 1/13: Na improved/stable - repeat Wednesday--add magnesium to today's labs--2.1, stable 1-15: NA stable 131; add calcium supplementation, PRN tums, and increase protonic to 40  mg BID for at bedtime nausea 1-17: Added as needed Zofran 4 mg every 8 hours, and miscellaneous nursing order to try and divide out medications and not give all at once crushed in applesauce due to intolerance of foul taste 1-20: Only 1 use of Zofran over the weekend, no recorded emesis --still having nausea around crushed pills only -04/08/23 BMP done yesterday afternoon, Na 132 stable, BUN 47 and Cr 1.44 which is up-- encourage PO fluids today, recheck tomorrow; will hold off on starting fluids just yet.  1-27: Sodium remains downtrending 129, BUN and creatinine slightly improved.  P.o. intakes appear low Is.  Start 500 cc IV fluids today, repeat in AM. 1-28: Sodium improved to 131 with IV fluids and DC torsemide.  Continue regular monitoring of sodium  8.  Acute blood loss anemia.  Follow-up CBC -stable   - HgB improving  9.  COVID-positive.  Completed Paxlovid  -1-17: DC schedule guaifenesin.  10.  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  Seen by vascular surgery Dr Carolynn Sayers and plan outpatient follow-up 1 month  11.  History of VT peri-CABG.  Amiodarone 200 mg BID>>daily, Lanoxin 0.125 mg daily  -Remains with low/stable heart rate -1-14: Patient reporting irregular palpitations during therapies.  None documented, made PT aware so they can document any events during their therapies today.--did not occur per therapies 1/14  12.  Hyperlipidemia.  Lipitor 80mg  daily 13.  Diastolic congestive heart failure.  Torsemide 20 mg daily, Cozaar 25 mg daily, Aldactone 25 mg daily. Dapagliflozin 10mg  daily. Monitor for any signs of fluid overload.  Daily weights.  1-24: Weights with ongoing downtrend, per record patient has adequate p.o. intakes and compliant with nutrition shakes between meals, now with reports of orthostasis and blood pressure 80 over 50s this afternoon.  Reduce torsemide to 10 mg daily, and encourage p.o. fluids. - Orthostatic vitals in AM  ordered -1/25-26/25 wt fairly stable; monitor 1/27: Increasingly  low blood pressure, will DC torsemide today and encourage p.o. fluids, initiate 500 cc IV fluids at 50 cc/h today.  BMP in AM. 1-28: Weights have stabilized today.  Continue monitoring for 1 to 2 days, then consider resumption of torsemide at lower dose. Filed Weights   04/08/23 0501 04/09/23 0600 04/10/23  0443  Weight: 49.7 kg 48 kg 48.8 kg    14.  Sacral wound.  Wound care nurse follow-up.  Covered with foam dressing -1-16: Caught himself on the back during a toileting transfer today per nursing, resulting in small wound, covered in barrier dressing.  15.  Dysphagia.  Dysphagia #2 thin liquids.  Nasogastric tube for nutritional support. Just upgraded diet- might not need Cortrak long -Continue supplemental tube feeds as above, encourage p.o. intakes, protein. -1-10: Eating 60 to 90% of meals per documentation.  Discontinue core track, tube feeds.--Continuing to eat well 1/28: P.o. intakes adequate per record, but cachectic appearance and continuing to lose weight.  Somewhat improved with adjustment of diuretics as above.  Will start mirtazapine for appetite stimulation.  16. Stool regimen: colace 200mg  daily  -1-17: DC liquid Colace, having regular bowel movements  -03/31/23 no BM in 3 days, will restart colace at 100mg  daily for now.  -04/01/23 pt states BM yesterday but none documented since 1/15, nurse documentation states LBM 1/16, unclear which is correct; asked nursing to please document if he's had BM today, if none today then would advance bowel regimen --1-20: Increase Colace to 100 mg twice daily, add Senokot S1 tab at night  -1/24 LBM  -04/07/23 no good BM since 1/21, just a smear on 1/24; hard palpable stool in lower abdomen, pt feels it in rectum; will do sorbitol then mineral oil enema. Hopefully this improves it.  -04/08/23 multiple BMs yesterday with enema/meds, feels better; cont regimen  17. Thrombocytosis.   resolved   LOS: 21 days A FACE TO FACE EVALUATION WAS PERFORMED  Angelina Sheriff 04/10/2023, 9:48 AM

## 2023-04-10 NOTE — Progress Notes (Signed)
Patient ID: KEMUEL BUCHMANN, male   DOB: 10-May-1945, 78 y.o.   MRN: [id number]  1440- SW spoke with Ricky/Admissions with [facility] to discuss referral. Declined as does not take pending Medicaid applications.   1443- SW left message with receptionist about referral for [place]/Admissions with [employer]. SW waiting on follow-up.   1446- SW left message for Christie/Admissions with [facility] to discuss bed offer and referral,and waiting on return phone call.   [name], MSW, LCSW Office: [phone] Cell: [phone] Fax: [phone]

## 2023-04-10 NOTE — Patient Care Conference (Cosign Needed)
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 Elijah Birk Social Worker Present: [name], LCSWA Nurse Present: Konrad Dolores, RN;Deborah Lambert Mody, RN PT Present: Malachi Pro, PT OT Present: Jake Shark, OT SLP Present: Jeannie Done, SLP PPS Coordinator present : Fae Pippin, SLP     Current Status/Progress Goal Weekly Team Focus  Bowel/Bladder   Incontinent x 2. Last bowel movement 1/27.   Toilet patient q2-3 hours while awake and q 4 at HS.   Monitor bowel and bladder function.    Swallow/Nutrition/ Hydration   regular/thin liquid diet   minA for use of strategies  continued use of strategies, continued tolerance of diet    ADL's   (S) UB ADLs, min A LB, CGA transfers and mobility but continues to require skilled cueing for hand placement, RW management, and pacing. Remains confused   Supervision ADLs and transfers   ADLs, transfers, functional mobility, activity tolerance, cognition    Mobility   supervision bed mobility and transfers, CGA gait with AD due to inconsistent performance, very limited endurance   Supervision  continued work on endurance, cognition, balance, strength, ambulation    Communication                 Safety/Cognition/ Behavioral Observations  mod-max assist overall for basic tasks, continues to exhibit slow and mild improvements overall. Participation continues to be a barrier   mod assist   attention, carryover, participation, memory notebook use and consistency, itellectual awareness, orientation with use of aids    Pain   Denies pain.   0 pain.   Assess pain q shift and prn.    Skin   Scratch marks to arms and legs, redness to bilateral buttocks, on gerhardtts cream, blister  to right arm, foam dressing in place.   No skin breakdown.  Assess skin q shift and prn.      Discharge Planning:  D/c is pending skilled bed offer and then insruance approval. if unable to get bed, pt will transition to home. Pt needs to discharge to home Mod I as there is limited support at discharge. Pt son will come around 2/6 or 2/7 to visit with pt and will not be here long. SW emailed pt son Medicaid application; Medicaid application also left in room for pt family friends to assist. HHPT/OT/SLP/aideRN with [facility]. SW will confirm there are no barriers to discharge.   Team Discussion: Patient was admitted post cardiogenic shock with cardiac debility, blood pressure problems  and consistent decrease in  weight.  Patient on target to meet rehab goals: no,  no change in current functional status due to medical condition and cognitive challenges.  *See Care Plan and progress notes for long and short-term goals.   Revisions to Treatment Plan:  Upgraded to regular diet   Teaching Needs: Safety, medications, transfers, dietary modifications.  Current Barriers to Discharge: Decrease support  Possible Resolutions to Barriers: SNF placement Family education Medicaid application pending Home health PT/OT/SLP/aide RN  DME: [facility]     Medical Summary Current Status: Medically complicated by dementia/poor memory, heart failure, hyponatremia, hypotension, wound care, anxiety, and incontinence of  bowel/bladder  Barriers to Discharge: Behavior/Mood;Cardiac Complications;Complicated Wound;Electrolyte abnormality;Inadequate Nutritional Intake;Medical stability;Self-care education;Incontinence   Possible Resolutions to [employer] Focus: Treat anxiety and sleep disruption with daytime stimulation and medications, daily weight and regular monitoring of volume status, intermittent IV fluids for hyponatremia and overdiuresis, titration of cardiac medications to balance volume, monitoring of wounds, frequent reorientation   Continued Need for Acute Rehabilitation Level of Care: The patient requires daily medical management by a physician with specialized training in physical medicine and rehabilitation for the following reasons: Direction of a multidisciplinary physical rehabilitation program to maximize functional independence : Yes Medical management of patient stability for increased activity during participation in an intensive rehabilitation regime.: Yes Analysis of laboratory values and/or radiology reports with any subsequent need for medication adjustment and/or medical intervention. : Yes   I attest that I was present, lead the team conference, and concur with the assessment and plan of the team.   Konrad Dolores [facility] 04/10/2023, 1:02 PM

## 2023-04-10 NOTE — Progress Notes (Signed)
Physical Therapy Session Note  Patient Details  Name: Walter Vargas MRN: [id number] Date of Birth: Jul 20, 1945  Today's Date: 04/10/2023 PT Individual Time: 4098-1191 PT Individual Time Calculation (min): 72 min   Short Term Goals: Week 3:  PT Short Term Goal 1 (Week 3): STGs = LTGs  Skilled Therapeutic Interventions/Progress Updates:     Pt received supine in bed asleep. Easily awakens to verbal stimuli and agrees to therapy. No complaint of pain. Pt performs supine to sit with bed features and cues for sequencing and positioning at EOB. Pt able to don shoes while seated at EOB with setup assistance and increased time required. Pt stands without AD ans ambulates to sink with close supervision and cues for safety. PT brings WC to pt and cues to sit following washing face. WC transport to gym. Pt completes balance and strengthening activities in parallel bars with mirror for visual feedback. Pt completes 3x20' sidestepping Rt and Lt, with cues for upright posture, and neutral hip rotation to target strengthening of hip abductors. Extended seated rest breaks between bouts. Pt then performs 3x10 squats and heel raises with verbal and tactile cues for correct performance. Following rest break, pt ambulates x100' without AD to challenge balance and increasing loading through legs. Pt requires modA and has notable unsteadiness and fatigue during activity. PT provides cues for increasing stride length and gait speed to improve balance, as well as safe sequencing of transition to WC. WC transport back to room. Left seated with alarm intact and all needs within reach.   Therapy Documentation Precautions:  Precautions Precautions: Fall, Sternal, Other (comment) Precaution Booklet Issued: No Precaution Comments: cortrak, sternal precautions Restrictions Weight Bearing Restrictions Per Provider Order: No RUE Weight Bearing Per Provider Order: Weight bearing as tolerated LUE Weight Bearing Per Provider  Order: Weight bearing as tolerated  Therapy/Group: Individual Therapy  Beau Fanny, PT, DPT 04/10/2023, 3:59 PM

## 2023-04-10 NOTE — Progress Notes (Signed)
Occupational Therapy Session Note  Patient Details  Name: HAWLEY MICHEL MRN: [id number] Date of Birth: 06-21-1945  Today's Date: 04/10/2023 OT Individual Time: 1345-1500 OT Individual Time Calculation (min): 75 min    Short Term Goals: Week 3:  OT Short Term Goal 1 (Week 3): Pt will complete toileting tasks with min A OT Short Term Goal 2 (Week 3): Pt will utilize external cues to properly place hand on w/c during sit > stand OT Short Term Goal 3 (Week 3): Pt will complete full ADL routine with no more than 1 rest break to demo improved activity tolerance  Skilled Therapeutic Interventions/Progress Updates:    Pt received supine with no c/o pain, agreeable to OT session.  He was incontinent of urine and unaware. He came to EOB with (S). He stood from EOB multiple times to doff pants/brief, complete hygiene, and then don new pants. He required min A to don pants- to thread LLE. He required min A for peri hygiene as well. Poor safety awareness, bending down to get washcloth despite OT cues to hold onto RW. He took a rest break supine following. OT also applied new foam dressing to his sacrum. He completed 120 ft of functional mobility with the RW with CGA- quickly fatiguing and requiring rest break. Patient required increased time for initiation, cuing, rest breaks, and for completion of tasks throughout session. Utilized therapeutic use of self throughout to promote efficiency.  He then transferred onto the NuStep to challenge functional activity tolerance and cardiovascular endurance- level 5 initially but graded down to 3 d/t fatigue. He required a rest break after 4 min and then was able to complete 4 more minutes for a total of 8 active minutes. He completed 150 ft of functional mobility following to challenge dynamic standing balance and functional activity tolerance with the RW- CGA. In his room he completed oral care seated with set up assist. He returned to supine. OT provided him an ensure and  a mini- snickers per his request. He was left supine with all needs met, bed alarm set.   Therapy Documentation Precautions:  Precautions Precautions: Fall, Sternal, Other (comment) Precaution Booklet Issued: No Precaution Comments: cortrak, sternal precautions Restrictions Weight Bearing Restrictions Per Provider Order: No RUE Weight Bearing Per Provider Order: Weight bearing as tolerated LUE Weight Bearing Per Provider Order: Weight bearing as tolerated   Therapy/Group: Individual Therapy  [name] 04/10/2023, 6:23 AM

## 2023-04-10 NOTE — Progress Notes (Signed)
Nutrition Follow-up  DOCUMENTATION CODES:   Severe malnutrition in context of chronic illness  INTERVENTION:  Continue to encourage oral intake Continue with Ensure Plus High Protein po BID, each supplement provides 350 kcal and 20 grams of protein.    NUTRITION DIAGNOSIS:   Severe Malnutrition related to chronic illness as evidenced by percent weight loss, moderate fat depletion, severe fat depletion, severe muscle depletion.  Ongoing interventions in place.   GOAL:   Patient will meet greater than or equal to 90% of their needs    MONITOR:   PO intake, Weight trends, Supplement acceptance, TF tolerance, Skin, Diet advancement, I & O's, Labs  REASON FOR ASSESSMENT:   Consult Enteral/tube feeding initiation and management  ASSESSMENT:   78 y/o male admitted to acute rehab with debility. Initially admitted to acute hospitalization with increasing shortness of breath on 02/10/23 ongoing for a few months with chest tightness and abdominal discomfort.   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 He had just returned from therapy with meal on bedside table.  Noted to drink most of his ensures with some refusals.  Meal intake 25-100%, 60% average x 7 meals Meds; Vita C, Aricept, Remeron, MVM, spironolactone, Thiamine. Supplement Ensure Plus High Protein po BID, each supplement provides 350 kcal and 20 grams of protein. Hospital weight history: 04/10/23 04:43:58 48.8 kg 107.58 lbs  04/09/23 0600 48 kg 105.82 lbs  04/08/23 05:01:06 49.7 kg 109.57 lbs  04/07/23 0500 49.6 kg 109.35 lbs  04/06/23 0529 48.6 kg 107.14 lbs  04/05/23 05:22:41 48.6 kg 107.14 lbs  04/04/23 05:44:43 49 kg 108.03 lbs  04/04/23 0500 49.8 kg 109.79 lbs  04/03/23 05:07:42 49.1 kg 108.25 lbs  04/02/23 04:26:43 51.6 kg 113.76 lbs  04/01/23 05:01:55 50.4 kg 111.11 lbs  03/31/23 0500 55.2 kg 121.69 lbs  03/30/23  0500 54.4 kg 119.93 lbs  03/29/23 0454 55.4 kg 122.14 lbs  03/28/23 0500 55.6 kg 122.58 lbs  03/27/23 0600 55.9 kg 123.24 lbs  03/27/23 0500 56 kg 123.46 lbs  03/26/23 0600 57.7 kg 127.21 lbs  03/24/23 04:36:08 60.4 kg 133.16 lbs  03/23/23 0241 59.9 kg 132.06 lbs  03/22/23 0500 60.4 kg 133.16 lbs  03/21/23 0500 57.7 kg 127.21 lbs   NUTRITION - FOCUSED PHYSICAL EXAM:  Flowsheet Row Most Recent Value  Orbital Region Moderate depletion  Upper Arm Region Severe depletion  Buccal Region Moderate depletion  Temple Region Severe depletion  Clavicle Bone Region Severe depletion  Clavicle and Acromion Bone Region Severe depletion  Scapular Bone Region Moderate depletion  Dorsal Hand Moderate depletion  Patellar Region Severe depletion  Anterior Thigh Region Severe depletion  Posterior Calf Region Severe depletion  Edema (RD Assessment) None  Hair Reviewed  Eyes Reviewed  Mouth Reviewed  Skin Reviewed  Nails Reviewed       Diet Order:   Diet Order             Diet regular Room service appropriate? Yes; Fluid consistency: Thin  Diet effective now                   EDUCATION NEEDS:   Not appropriate for education at this time  Skin:  Skin Assessment: Reviewed RN Assessment Skin Integrity Issues:: Stage II, Other (Comment) Stage II: mid coccyx Incisions: R leg, chest Other: wound to penis-cirumferential  Last BM:  04/10/23 type 6  Height:   Ht Readings from Last 1  Encounters:  03/31/23 5\' 8"  (1.727 m)    Weight:   Wt Readings from Last 1 Encounters:  04/10/23 48.8 kg    Ideal Body Weight:  70 kg  BMI:  Body mass index is 16.36 kg/m.  Estimated Nutritional Needs:   Kcal:  2000-2200 kcal/day  Protein:  91-105 gm/day  Fluid:  1800-2000 mL/day    Jamelle Haring RDN, LDN Clinical Dietitian   If unable to reach, please contact "[facility]patient" secure chat group between 8 am-4 pm daily"

## 2023-04-10 NOTE — Progress Notes (Signed)
Speech Language Pathology Daily Session Note  Patient Details  Name: Walter Vargas MRN: [id number] Date of Birth: [birth year]  Today's Date: 04/10/2023 SLP Individual Time: 7829-5621 SLP Individual Time Calculation (min): 54 min  Short Term Goals: Week 3: SLP Short Term Goal 1 (Week 3): Patient will orient to date and time using available external aids with mod assist. SLP Short Term Goal 2 (Week 3): Patient will utilize external memory aids to recall daily information with 50% accuracy given mod assist. SLP Short Term Goal 3 (Week 3): Patient will demonstrate intellectual awareness of deficits as evidenced by stating 2 cognitive and 2 physical deficits with max assist. SLP Short Term Goal 4 (Week 3): Patient will solve basic environmental problems with 75% accuracy given mod assist. SLP Short Term Goal 5 (Week 3): Patient will recall and utilize swallow safety strategy of intermittent throat clear during assessment of least restrictive diet tolerance 90% of the time given mod assist.  Skilled Therapeutic Interventions: SLP conducted skilled therapy session targeting cognitive retraining goals. Patient agreeable to engaging in puzzle sorting task. Patient provided with parameters to sort pieces into two piles. Patient benefited from mod assist overall to identify edge vs. Middle puzzle pieces and place pieces in the correct pile. During puzzle building, SLP provided patient with field of 3 choices for correct piece and mod assist for placement of each piece. Patient sustained attention to task for 30 minutes given mod assist. During session, MD entered. Patient's memory of daily events is improving, with patient recalling some details of previous conversations with MD and therapy team but not others. Patient demonstrates significantly impaired awareness of deficits, continuing to state that upon return home, his memory would be better. Patient required mod to max assist to solve basic environmental  problems throughout session. Patient was left in lowered bed with call bell in reach and bed alarm set. SLP will continue to target goals per plan of care.       Pain Pain Assessment Pain Scale: 0-10 Pain Score: 0-No pain  Therapy/Group: Individual Therapy  Jeannie Done, M.A., CCC-SLP  Yetta Barre 04/10/2023, 12:28 PM

## 2023-04-11 NOTE — Progress Notes (Signed)
Physical Therapy Session Note  Patient Details  Name: Walter Vargas MRN: [id number] Date of Birth: October 16, 1945  Today's Date: 04/11/2023 PT Individual Time: 1050-1200 PT Individual Time Calculation (min): 70 min   Short Term Goals: Week 3:  PT Short Term Goal 1 (Week 3): STGs = LTGs  Skilled Therapeutic Interventions/Progress Updates:     Pt received supine in bed and agrees to therapy. No complaint of pain. Supine to sit with bed features and cues for positioning at EOB. PT provides minA to don shoes at EOB. Pt performs sit to stand with cues for initiation and sequencing. Pt ambulates x150' to dayroom with RW and cues to maintain upright gaze and posture to improve balance. Seated rest break. While seated PT takes pt's BP with reading of 109/70 and HR of 81 bpm. Pt stands and BP assessed in standing with reading of 101/56 and HR of 65 bpm. No orthostatic symptoms noted. Pt ambulates x175' with RW and same assistance and cues. Seated rest break. Pt then ambulates x300' prior to requesting rest break. Pt completes ramp navigation and ambulates x300' back to dayroom with same cues and assistance. Pt left seated in WC with alarm intact and all needs within reach.   Therapy Documentation Precautions:  Precautions Precautions: Fall, Sternal, Other (comment) Precaution Booklet Issued: No Precaution Comments: cortrak, sternal precautions Restrictions Weight Bearing Restrictions Per Provider Order: No RUE Weight Bearing Per Provider Order: Weight bearing as tolerated LUE Weight Bearing Per Provider Order: Weight bearing as tolerated   Therapy/Group: Individual Therapy  Beau Fanny, PT, DPT 04/11/2023, 4:17 PM

## 2023-04-11 NOTE — Progress Notes (Signed)
PROGRESS NOTE   Subjective/Complaints:  No acute complaints.  No events overnight.  No as needed utilizations.. Blood pressures remain soft recently, heart rate low but stable.  He does endorse becoming lightheaded today, which was unlike him.  Nursing blood pressures 87/53, on repeat low 100s over 50s.  Had multiple bowel movements overnight 1-27; generally incontinent of bowel and bladder. Dietary evaluated yesterday, recommended continuing current management of severe malnutrition.  ROS: Denies fevers, chills,  abd pain, vomiting, diarrhea, SOB, cough, chest pain, new weakness or paraesthesias.   + Nausea w/ pills crushed in food--ongoing, slightly improved + anxiety--ongoing + Orthostasis  Objective:   No results found.   Recent Labs    04/09/23 0600  WBC 7.1  HGB 13.5  HCT 42.1  PLT 216      Recent Labs    04/09/23 0600 04/10/23 0504  NA 129* 131*  K 5.0 5.1  CL 91* 95*  CO2 26 26  GLUCOSE 76 74  BUN 46* 36*  CREATININE 1.27* 1.05  CALCIUM 9.2 9.2      Intake/Output Summary (Last 24 hours) at 04/11/2023 0837 Last data filed at 04/10/2023 1400 Gross per 24 hour  Intake 236 ml  Output 200 ml  Net 36 ml      Pressure Injury 03/15/23 Coccyx Mid;Lower Stage 2 -  Partial thickness loss of dermis presenting as a shallow open injury with a red, pink wound bed without slough. (Active)  03/15/23 0800  Location: Coccyx  Location Orientation: Mid;Lower  Staging: Stage 2 -  Partial thickness loss of dermis presenting as a shallow open injury with a red, pink wound bed without slough.  Wound Description (Comments):   Present on Admission: Yes    Physical Exam: Vital Signs Blood pressure (!) 115/58, pulse (!) 56, temperature 97.6 F (36.4 C), resp. rate 17, height 5\' 8"  (1.727 m), weight 51.5 kg, SpO2 100%.   Constitution: Appropriate appearance for age. No apparent distress.  Sitting upright in  bedside wheelchair.  Cachectic appearance.  Resp:   clear to auscultation bilaterally.  On room air, no respiratory distress. Cardio: Regular rate, reg rhythm.   Chronic bilateral lower extremity skin changes.No edema.  Abdomen: soft, Nondistended. Nontender,  +BS throughout, normoactive t  Psych: Appropriate mood and affect.  Mildly anxious. Skin:   + Right inner thigh/calf with scabbing from vein harvest site almost completely healed + Peripheral IV, C/D/I  MSK: Severe lumbar levoscoliosis.   Neurologic Exam:  Awake, alert, and oriented to self, partially to place, partially to time + Moderate memory and higher cognitive deficits  - ongoing Follows all simple commands. Cranial nerves II through XII intact.  Sensation intact.  No fine motor deficits.  No ataxia. Insight: Fair insight into current condition Strength: Antigravity against resistance all 4 extremities grossly 5- out of 5 throughout    Assessment/Plan: 1. Functional deficits which require 3+ hours per day of interdisciplinary therapy in a comprehensive inpatient rehab setting. Physiatrist is providing close team supervision and 24 hour management of active medical problems listed below. Physiatrist and rehab team continue to assess barriers to discharge/monitor patient progress toward functional and medical goals  Care Tool:  Bathing    Body parts bathed by patient: Right arm, Left arm, Chest, Abdomen, Front perineal area, Buttocks, Right upper leg, Left upper leg, Face, Right lower leg, Left lower leg   Body parts bathed by helper: Right lower leg, Left lower leg     Bathing assist Assist Level: Contact Guard/Touching assist     Upper Body Dressing/Undressing Upper body dressing   What is the patient wearing?: Pull over shirt    Upper body assist Assist Level: Contact Guard/Touching assist    Lower Body Dressing/Undressing Lower body dressing      What is the patient wearing?: Pants, Underwear/pull up      Lower body assist Assist for lower body dressing: Minimal Assistance - Patient > 75%     Toileting Toileting    Toileting assist Assist for toileting: Moderate Assistance - Patient 50 - 74%     Transfers Chair/bed transfer  Transfers assist     Chair/bed transfer assist level: Minimal Assistance - Patient > 75%     Locomotion Ambulation   Ambulation assist      Assist level: 2 helpers Assistive device: No Device Max distance: 5'   Walk 10 feet activity   Assist  Walk 10 feet activity did not occur: Safety/medical concerns  Assist level:  (able to ambulate with RW, but at basline did not use an AD)     Walk 50 feet activity   Assist Walk 50 feet with 2 turns activity did not occur: Safety/medical concerns         Walk 150 feet activity   Assist Walk 150 feet activity did not occur: Safety/medical concerns         Walk 10 feet on uneven surface  activity   Assist Walk 10 feet on uneven surfaces activity did not occur: Safety/medical concerns         Wheelchair     Assist Is the patient using a wheelchair?: Yes Type of Wheelchair: Manual Wheelchair activity did not occur:  (Sternal precautions)  Wheelchair assist level: Dependent - Patient 0% Max wheelchair distance: 150'    Wheelchair 50 feet with 2 turns activity    Assist    Wheelchair 50 feet with 2 turns activity did not occur: Safety/medical concerns       Wheelchair 150 feet activity     Assist  Wheelchair 150 feet activity did not occur: Safety/medical concerns       Blood pressure (!) 115/58, pulse (!) 56, temperature 97.6 F (36.4 C), resp. rate 17, height 5\' 8"  (1.727 m), weight 51.5 kg, SpO2 100%.  Medical Problem List and Plan: 1. 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  Sternal precautions             -patient may shower -ELOS/Goals: 14-18 days  supervision to mod I-- Placement/TBD  - 1/23: Per patient/family request, pursuing SNF placement due to inability of caregivers to provide 24/7 support with ongoing min assist ADLs and cognitive deficits requiring assistance   -Stable to continue CIR 1/14: Needs to be independent at discharge per SW; son has moved out of state and can maybe provide 1-2 weeks on discharge. Patient's son's GF has provided some support in the hospital. Carryover very poor; Max-total assist with sternal precautions, Min A if able to push up. Difficulty with LBD and toiletting tasks - will forget what he is doing with toileting tasks. Per PT has been needing lots of help at home  too. Per SW presenting like dementia cog-wise. Doing well with Dys 3. Will need assistance at discharge; SW to discuss with family.  1-16: Discussed patient dispo requirements with son [name], emphasizing need for full supervision at discharge and anticipating will be greater than a 1 to 2 months needed.  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  After discussion with social work, family still unsure who will come in for training, will likely provide supervision for 2 weeks at home prior to deciding whether patient needs more oversight. 1/21: At bedtime incontinence per nursing. Per SW family not agreeing to care plan involving [name]/her mother, which was offered; this is consistent with patient's wishes as well. Unsure of dispo plan at this time as family is not offering alternatives.  1-22: Family discussed appealing discharge with social work due to feeling patient is not medically optimized.  Per team, primary barriers remain cognitive, not expected to improve further with additional time. 1/28: Per SLP, patient panicked with whole medications even though he complains of crushed; he prefers them to be "dust". Markham Jordan is coming next  week. Upgraded to regular diet. Ongoing evening behaviors.   2.  Antithrombotics: -DVT/anticoagulation:  Pharmaceutical: Lovenox 40mg  daily.  Venous Doppler studies negative -antiplatelet therapy: Aspirin 81 mg daily.  3. Pain Management: Oxycodone as needed--no complaints  4. Mood/Behavior/Sleep: Provide emotional support             - antipsychotic agents: Seroquel 25 mg daily at 1000 and 50 mg nightly   - Continue current regimen, can add melatonin 5 mg as needed  -1-13: Will start to wean off antipsychotics; DC AM Seroquel for tomorrow 1-15: Patient tolerated well, change nightly Seroquel to as needed--has resulted in somewhat interrupted sleep but so far has been tolerable.  Noel Christmas states he gets anxious at night sometimes regarding dispo issues. 1/17: No PRNs used.  Continue melatonin, will add nightly as needed trazodone 50 mg retime at bedtime medications to 8 PM given this is his normal bedtime--improved 1-20: Family reporting compulsive calls, anxieties in the early evenings between therapies and sleep.  Patient is sleeping well, no hallucinations or signs of delirium at this time.  Likely early sundowning, adding Atarax for anxiety and informed nursing to be aware of these behaviors. 04-03-21: No reports by nursing overnight, no use of as needed Atarax 1-24: Increasing anxiety and confused behavior since family elected going to SNF; encouraged use of PRN Atarax, start BuSpar 5 mg twice daily 1-28: Adding mirtazapine 7.5 mg nightly for sleep, appetite stimulation, and ongoing anxiety reported by staff. 1/29: DC buspar due to no effect, addition of mirtazepine.    5. Neuropsych/cognition: This patient is capable of making decisions on his own behalf.  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  Weaning sedating medications as above.  Will start Aricept 5 mg nightly-theoretical risk  of arrhythmia, however on literature review cardiac events matched placebo on meta-analysis. 1-15: Tolerating Aricept well--and noticing some mild cognitive improvements 1/21: Per cognitive evaluation, patient can make reasonable decisions for himself although underlying dementia requires some reorientation and cueing  6. Skin/Wound Care: Routine skin checks. -His right lower extremity harvest site completing course of Ancef for cellulitis --1/8: transitioned to Augmentin 1-8 to complete 10-day course--appearance slightly more edematous/erythematous 1-14, will get WBC in a.m. and monitor closely,  still staying within margins of prior marking so may just be taking a long time to resolve  1-13: DC PICC line--site looks clean. -1-17: Right clavicle staples removed.  Remove abdominal sutures today. - 1/21: RLE swelling/infection resolved 1-22: Dry skin overlying prior infection site; add Eucerin daily--appearance improving  7. Fluids/Electrolytes/Nutrition/hyponatremia: Routine in and outs with follow-up chemistries, continue supplements.  -1-8: Low albumin, protein; continue current supplemental tube feeds as below.  Encourage protein and p.o. intakes.  Very mild hyponatremia, will recheck on Friday. 1-10: Hyponatremia 129, slight downtrend from 130.  Repeat BMP, serum osmol, urine studies in a.m. to confirm etiology--will be somewhat complicated by use of torsemide.  Placed on 1200 cc fluid restriction; patient is remaining well below this. -03/24/23 Na up to 132, SOsm WNL, urine studies not done yet -03/25/23 Urine Osm still not done... scheduled weekly labs starting Monday - 1/13: Na improved/stable - repeat Wednesday--add magnesium to today's labs--2.1, stable 1-15: NA stable 131; add calcium supplementation, PRN tums, and increase protonic to 40 mg BID for at bedtime nausea 1-17: Added as needed Zofran 4 mg every 8 hours, and miscellaneous nursing order to try and divide out medications and not  give all at once crushed in applesauce due to intolerance of foul taste 1-20: Only 1 use of Zofran over the weekend, no recorded emesis --still having nausea around crushed pills only -04/08/23 BMP done yesterday afternoon, Na 132 stable, BUN 47 and Cr 1.44 which is up-- encourage PO fluids today, recheck tomorrow; will hold off on starting fluids just yet.  1-27: Sodium remains downtrending 129, BUN and creatinine slightly improved.  P.o. intakes appear low Is.  Start 500 cc IV fluids today, repeat in AM. 1-28: Sodium improved to 131 with IV fluids and DC torsemide.  Continue regular monitoring of sodium 1-30: Labs in a.m.  8.  Acute blood loss anemia.  Follow-up CBC -stable   - HgB improving  9.  COVID-positive.  Completed Paxlovid  -1-17: DC schedule guaifenesin.  10.  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  Seen by vascular surgery Dr Carolynn Sayers and plan outpatient follow-up 1 month  11.  History of VT peri-CABG.  Amiodarone 200 mg BID>>daily, Lanoxin 0.125 mg daily  -Remains with low/stable heart rate -1-14: Patient reporting irregular palpitations during therapies.  None documented, made PT aware so they can document any events during their therapies today.--did not occur per therapies 1/14 1-29: Adjusting blood pressure medications as below; may need to adjust amiodarone to 100 mg daily if no improvement with adjustment of diuretics  12.  Hyperlipidemia.  Lipitor 80mg  daily 13.  Diastolic congestive heart failure.  Torsemide 20 mg daily, Cozaar 25 mg daily, Aldactone 25 mg daily. Dapagliflozin 10mg  daily. Monitor for any signs of fluid overload.  Daily weights.  1-24: Weights with ongoing downtrend, per record patient has adequate p.o. intakes and compliant with nutrition shakes between meals, now with reports of orthostasis and blood pressure 80 over 50s this afternoon.  Reduce torsemide to 10 mg daily, and encourage p.o. fluids. - Orthostatic  vitals in AM ordered -1/25-26/25 wt fairly stable; monitor 1/27: Increasingly  low blood pressure, will DC torsemide today and encourage p.o. fluids, initiate 500 cc IV fluids at 50 cc/h today.  BMP in AM. 1-28: Weights have stabilized today.  Continue monitoring for 1 to 2 days, then consider resumption of torsemide at lower dose. 1/29: Worsening orthostasis despite holding diuretic torsemide, no peripheral signs  of fluid overload, although weights have normalized at this point.  DC spironolactone 25 mg daily and Cozaar 25 mg daily, monitor closely.  Encourage p.o. fluids. Filed Weights   04/09/23 0600 04/10/23 0443 04/11/23 0400  Weight: 48 kg 48.8 kg 51.5 kg    14.  Sacral wound.  Wound care nurse follow-up.  Covered with foam dressing -1-16: Caught himself on the back during a toileting transfer today per nursing, resulting in small wound, covered in barrier dressing.  15.  Dysphagia/severe protein calorie malnutrition.  Dysphagia #2 thin liquids.  Nasogastric tube for nutritional support. Just upgraded diet- might not need Cortrak long -Continue supplemental tube feeds as above, encourage p.o. intakes, protein. -1-10: Eating 60 to 90% of meals per documentation.  Discontinue core track, tube feeds.--Continuing to eat well 1/28: P.o. intakes adequate per record, but cachectic appearance and continuing to lose weight.  Somewhat improved with adjustment of diuretics as above.  Will start mirtazapine for appetite stimulation. 1-29: Dietary evaluated, continue current management.  16. Stool regimen: colace 200mg  daily  -1-17: DC liquid Colace, having regular bowel movements  -03/31/23 no BM in 3 days, will restart colace at 100mg  daily for now.  -04/01/23 pt states BM yesterday but none documented since 1/15, nurse documentation states LBM 1/16, unclear which is correct; asked nursing to please document if he's had BM today, if none today then would advance bowel regimen --1-20: Increase Colace  to 100 mg twice daily, add Senokot S1 tab at night  -1/24 LBM  -04/07/23 no good BM since 1/21, just a smear on 1/24; hard palpable stool in lower abdomen, pt feels it in rectum; will do sorbitol then mineral oil enema. Hopefully this improves it.  -04/08/23 multiple BMs yesterday with enema/meds, feels better; cont regimen  17. Thrombocytosis.  resolved   LOS: 22 days A FACE TO FACE EVALUATION WAS PERFORMED  Angelina Sheriff 04/11/2023, 8:37 AM

## 2023-04-11 NOTE — Plan of Care (Signed)
  Problem: Consults Goal: RH GENERAL PATIENT EDUCATION Description: See Patient Education module for education specifics. Outcome: Progressing   Problem: RH BOWEL ELIMINATION Goal: RH STG MANAGE BOWEL WITH ASSISTANCE Description: STG Manage Bowel with toileting Assistance. Outcome: Progressing Goal: RH STG MANAGE BOWEL W/MEDICATION W/ASSISTANCE Description: STG Manage Bowel with Medication with Mod I Assistance. Outcome: Progressing   Problem: RH BLADDER ELIMINATION Goal: RH STG MANAGE BLADDER WITH ASSISTANCE Description: STG Manage Bladder With toileting Assistance Outcome: Progressing   Problem: RH SKIN INTEGRITY Goal: RH STG SKIN FREE OF INFECTION/BREAKDOWN Description: Manage skin with min assist Outcome: Progressing   Problem: RH SAFETY Goal: RH STG ADHERE TO SAFETY PRECAUTIONS W/ASSISTANCE/DEVICE Description: STG Adhere to Safety Precautions With cues Outcome: Progressing   Problem: RH PAIN MANAGEMENT Goal: RH STG PAIN MANAGED AT OR BELOW PT'S PAIN GOAL Description: Less than 4 with prns Outcome: Progressing   Problem: RH KNOWLEDGE DEFICIT GENERAL Goal: RH STG INCREASE KNOWLEDGE OF SELF CARE AFTER HOSPITALIZATION Description: Patient will be able to take care of his meds ; diet and Manage skin care using educational resources independently  Outcome: Progressing

## 2023-04-11 NOTE — Progress Notes (Addendum)
Patient ID: Walter Vargas, male   DOB: 02-18-1946, 78 y.o.   MRN: [id number]  SW spoke with Walter Vargas with Walter Vargas to discuss pt referral in length based on bed offer. Reports pt will likely need a pending Medicaid number. SW will explore if the family has submitted application. She will follow-up after her morning meeting.   SW spoke with pt family friend Walter Vargas to see if they picked up Medicaid application. She has not due to children being sick. Plans to come by atleast by the weekend to pick up and complete for patient. SW shared barriers to placement will be having a pending medicaid application.   *SW received message from [facility] reporting that pt will require pending Medicaid ID# before able to accept.   SW sent referral to financial navigators to see if they are able to assist with LTC Medicaid. SW waiting on follow-up.   [id number] left detailed message for [name] discussing above about SNF and pending Medicaid number required for placement, financial reviewer to help assist with LTC Medicaid Application,  and reminded on CIR remains contingent upon insurance, and will need to come up with a plan. SW requested a return phone call.  SW spoke with Walter Vargas to request her assistance tomorrow to be present with financial navigator Walter Vargas tomorrow between 12:30pm-1pm. Walter Vargas will make efforts to be here during this time while pt is not in therapy.   Walter Vargas, MSW, LCSW Office: [phone] Cell: [phone] Fax: [phone]

## 2023-04-11 NOTE — Progress Notes (Signed)
Speech Language Pathology Daily Session Note  Patient Details  Name: Walter Vargas MRN: [id number] Date of Birth: 07-03-1945  Today's Date: 04/11/2023 SLP Individual Time: 0804-0902 SLP Individual Time Calculation (min): 58 min  Short Term Goals: Week 3: SLP Short Term Goal 1 (Week 3): Patient will orient to date and time using available external aids with mod assist. SLP Short Term Goal 2 (Week 3): Patient will utilize external memory aids to recall daily information with 50% accuracy given mod assist. SLP Short Term Goal 3 (Week 3): Patient will demonstrate intellectual awareness of deficits as evidenced by stating 2 cognitive and 2 physical deficits with max assist. SLP Short Term Goal 4 (Week 3): Patient will solve basic environmental problems with 75% accuracy given mod assist. SLP Short Term Goal 5 (Week 3): Patient will recall and utilize swallow safety strategy of intermittent throat clear during assessment of least restrictive diet tolerance 90% of the time given mod assist.  Skilled Therapeutic Interventions: SLP conducted skilled therapy session targeting cognitive retraining goals. Patient has demonstrated improvements in the area of recall of daily events and information, making several independent statements throughout session re: information given by therapists during previous sessions with 100% accuracy. Patient recalled therapy tasks from previous session with 100% accuracy. Upon SLP entry, patient declared "oh good! Let's finish that puzzle we started yesterday", thus facilitated continued sorting/scanning task with patient benefiting from mod cues for basic problem solving during piece connection etc. Patient requested to brush his teeth, thus SLP assisted patient to sink utilizing RW and CGA. Patient required mod cues during ADLs for sequencing and functional basic problem solving. At end of session, SLP provided min assist for phone navigation to locate photos of patient's family  during discussion of family. Patient was left in lowered bed with call bell in reach and bed alarm set. SLP will continue to target goals per plan of care.       Pain Pain Assessment Pain Scale: 0-10 Pain Score: 0-No pain  Therapy/Group: Individual Therapy  Jeannie Done, M.A., CCC-SLP  Yetta Barre 04/11/2023, 11:15 AM

## 2023-04-11 NOTE — Progress Notes (Signed)
Occupational Therapy Session Note  Patient Details  Name: Walter Vargas MRN: [id number] Date of Birth: 1945/06/02  Session 1 Today's Date: 04/11/2023 OT Individual Time: 1000-1045 OT Individual Time Calculation (min): 45 min    Session 2 Today's Date: 04/11/2023 OT Individual Time: 1400-1445 OT Individual Time Calculation (min): 45 min    Short Term Goals: Week 3:  OT Short Term Goal 1 (Week 3): Pt will complete toileting tasks with min A OT Short Term Goal 2 (Week 3): Pt will utilize external cues to properly place hand on w/c during sit > stand OT Short Term Goal 3 (Week 3): Pt will complete full ADL routine with no more than 1 rest break to demo improved activity tolerance  Skilled Therapeutic Interventions/Progress Updates:    Session 1 Pt received supine with no c/o pain, agreeable to OT session. He was agreeable to shower today. He came to EOB with (S). He doffed shoes and demonstrated difficulty with sequencing, often forgetting the task we were initiating. He was again perseverative on his phone and the charger. He stood from EOB with min A 2/2 posterior bias. With the RW he completed functional mobility into the bathroom and to the shower chair. He required mod cueing to sequence transfer into walk in shower. He bathed UB/LB with min cueing for thoroughness and safety but no touching assist required today. He returned to the EOB following. He donned a shirt with (S) and pants with min A. He returned to supine and was left with all needs met, bed alarm set.   Session 2 Pt received sitting in the w/c with no c/o pain. His friend [name] arrived and was motivating for pt to participate. Completed orthostatic vitals- 96/44 sitting, 94/52 standing. No symptoms. He completed 150 ft of functional mobility with the RW, CGA overall. He was very fatigued following and required a seated rest break, 2x more trials to challenge cardiovascular endurance and dynamic balance. He then completed  reciprocal stepping activity to challenge dynamic balance with BUE with min A- tapping R/L LE onto a 7 in cone- carryover to household threshold management. He returned to his room following and was left sitting up with all needs met, chair alarm set and friend [name] present.   Therapy Documentation Precautions:  Precautions Precautions: Fall, Sternal, Other (comment) Precaution Booklet Issued: No Precaution Comments: cortrak, sternal precautions Restrictions Weight Bearing Restrictions Per Provider Order: No RUE Weight Bearing Per Provider Order: Weight bearing as tolerated LUE Weight Bearing Per Provider Order: Weight bearing as tolerated  Therapy/Group: Individual Therapy  [name] 04/11/2023, 6:13 AM

## 2023-04-12 LAB — BASIC METABOLIC PANEL
Anion gap: 7 (ref 5–15)
BUN: 30 mg/dL — ABNORMAL HIGH (ref 8–23)
CO2: 24 mmol/L (ref 22–32)
Calcium: 8.8 mg/dL — ABNORMAL LOW (ref 8.9–10.3)
Chloride: 97 mmol/L — ABNORMAL LOW (ref 98–111)
Creatinine, Ser: 1 mg/dL (ref 0.61–1.24)
GFR, Estimated: >60 mL/min (ref >60)
Glucose, Bld: 73 mg/dL (ref 70–99)
Potassium: 4.9 mmol/L (ref 3.5–5.1)
Sodium: 128 mmol/L — ABNORMAL LOW (ref 135–145)

## 2023-04-12 MED ORDER — SODIUM CHLORIDE 0.9 % IV SOLN: INTRAVENOUS | Status: AC

## 2023-04-12 NOTE — Progress Notes (Signed)
Patient alert and oriented x 3. Patient disoriented to time. Patient refused oral care and refused scheduled PO medications which includes colace, Pepcid, melatonin, mirtazapine, and senna-docusate. Patient educated on risk/benefits of all medications. Patient "states I do not want to take those medications they do not work. Patient agreed to take Aricept PO. Will continue to provide education to patient.

## 2023-04-12 NOTE — Progress Notes (Signed)
Physical Therapy Session Note  Patient Details  Name: Walter Vargas MRN: [id number] Date of Birth: 16-Dec-1945  Today's Date: 04/12/2023 PT Individual Time: 1105-1200 PT Individual Time Calculation (min): 55 min   Short Term Goals: Week 3:  PT Short Term Goal 1 (Week 3): STGs = LTGs  Skilled Therapeutic Interventions/Progress Updates:     Pt received supine in bed and agrees to therapy. No complaint of pain. Orthostatic vital signs assessed to begin session: Supine: BP 115/64 HR 53 bpm Seated: BP 101/63 HR 59 bpm Standing: BP 83/47 HR 66 bpm  Pt performs transitional movements from supine to standing with supervision and cues for optimal sequencing. Pt is mildly symptomatic of orthostasis and requests to lie supine for several minutes to rest.  PT provides pt with soda and encourages fluid intake. Pt drinks several sips but unable to drink more at this time. Supine to sit with same cues. Pt performs sit to stand with RW and cues for initiation and hand position. Pt ambulates x250' to gym with RW and cues to increase proximity to RW and upright posture to improve balance. Seated rest break. Pt performs 2x10 step ups on 6" step with RW for upper extremity support, with CGA and cues for body mechanics and safe foot position. Seated rest breaks between bouts. Activity progressed by having pt complete step ups without upper extremity support. PT provides modA initially progressing to maxA due to posterior bias. Pt completes x4 prior to requiring rest break, and states that he feels out of breath. WC transport back to room. RN notified of pt's complaints and in room. Pt left seated in WC with alarm intact and all needs within reach.   Therapy Documentation Precautions:  Precautions Precautions: Fall, Sternal, Other (comment) Precaution Booklet Issued: No Precaution Comments: cortrak, sternal precautions Restrictions Weight Bearing Restrictions Per Provider Order: No RUE Weight Bearing Per  Provider Order: Weight bearing as tolerated LUE Weight Bearing Per Provider Order: Weight bearing as tolerated   Therapy/Group: Individual Therapy  Beau Fanny, PT, DPT 04/12/2023, 4:07 PM

## 2023-04-12 NOTE — Progress Notes (Signed)
Speech Language Pathology Daily Session Note  Patient Details  Name: Walter Vargas MRN: [id number] Date of Birth: 04-03-1945  Today's Date: 04/12/2023 SLP Individual Time: 1349-1449 SLP Individual Time Calculation (min): 60 min  Short Term Goals: Week 3: SLP Short Term Goal 1 (Week 3): Patient will orient to date and time using available external aids with mod assist. SLP Short Term Goal 2 (Week 3): Patient will utilize external memory aids to recall daily information with 50% accuracy given mod assist. SLP Short Term Goal 3 (Week 3): Patient will demonstrate intellectual awareness of deficits as evidenced by stating 2 cognitive and 2 physical deficits with max assist. SLP Short Term Goal 4 (Week 3): Patient will solve basic environmental problems with 75% accuracy given mod assist. SLP Short Term Goal 5 (Week 3): Patient will recall and utilize swallow safety strategy of intermittent throat clear during assessment of least restrictive diet tolerance 90% of the time given mod assist.  Skilled Therapeutic Interventions: SLP conducted skilled therapy session targeting dysphagia management and cognitive retraining goals. Patient had visitors present during session [name] and his long time friend, [name]), but patient provided permission to continue with therapy session. Patient engaged in conversation re: awareness of deficits, safety of returning home, memory of daily events, and basic environmental problem solving benefiting from max cues for anticipatory awareness/safety, mod assist for problem solving, min to supervision for recall of daily events, and supervision for orientation to date. Patient's friend brought regular-texture chicken from home, which patient consumed during session. Patient consumed 3 bites and exhibited significantly prolonged mastication time, however present family/friends noted that this was baseline. Patient exhibited no overt s/sx of penetration/aspiration and completed  intermittent throat clear strategy with supervision. Patient recalled doctor's recommendations to increase oral intake of food independently, however vehemently declined further than three bites of chicken. Attempted to provide reasoning for request to eat more with no success. Patient was left in lowered bed with call bell in reach and bed alarm set. SLP will continue to target goals per plan of care.       Pain Pain Assessment Pain Scale: 0-10 Pain Score: 0-No pain  Therapy/Group: Individual Therapy  Jeannie Done, M.A., CCC-SLP  Yetta Barre 04/12/2023, 3:54 PM

## 2023-04-12 NOTE — Progress Notes (Signed)
Physical Therapy Session Note  Patient Details  Name: Walter Vargas MRN: [id number] Date of Birth: [birth year]  Today's Date: 04/12/2023 PT Individual Time: 0805-0849 PT Individual Time Calculation (min): 44 min   Short Term Goals: Week 3:  PT Short Term Goal 1 (Week 3): STGs = LTGs  Skilled Therapeutic Interventions/Progress Updates: Patient supine in bed with nsg obtaining vitals and administering medication on entrance to room. Patient alert and agreeable to PT session.   Patient reported no pain, only fatigue at beginning of session.  Therapeutic Activity: Bed Mobility: Pt performed supine<>sit on EOB with supervision. Transfers: Pt performed sit<>stand transfers throughout session with supervision. Provided VC for anterior scoot to edge and hand placement/RW management.  Gait Training:  Pt ambulated from room<>day room gym using RW with CGA. Pt required VC to maintain upright posture per forward flexed presentation for improved standing balance (PTA asked pt if he understood reason for cue, pt stated it was for his posture - PTA further emphasized safety aspects of avoiding anteriorly leaning with pt understanding).   Neuromuscular Re-ed: NMR facilitated during session with focus on dynamic standing balance. - toe taps to 3 different colored cones. PTA calling out specific color and LE to tap. Pt with R HHA and with minA throughout with VC to correct L lean back to center when stepping back to neutral stance. Pt performed task until needing to take a seated rest break  NMR performed for improvements in motor control and coordination, balance, sequencing, judgement, and self confidence/ efficacy in performing all aspects of mobility at highest level of independence.   Therapeutic Exercise: Pt performed the following exercises with therapist providing the described cuing and facilitation for improvement. - B Levator scapula stretch 3 x 15s with demonstration and VC for mechanics.    Manual Therapy: Palpation of B upper trap musculature performed with trigger point noted on R. Education and rationale provided with pt agreeing to participate in intervention. - Soft trigger point release to R stated area with soft tissue mobilization to follow throughout. Pt reported decrease in tightness  Patient sitting EOB at end of session with hand off to OT.      Therapy Documentation Precautions:  Precautions Precautions: Fall, Sternal, Other (comment) Precaution Booklet Issued: No Precaution Comments: cortrak, sternal precautions Restrictions Weight Bearing Restrictions Per Provider Order: No RUE Weight Bearing Per Provider Order: Weight bearing as tolerated LUE Weight Bearing Per Provider Order: Weight bearing as tolerated  Therapy/Group: Individual Therapy  Camiya Vinal PTA 04/12/2023, 12:58 PM

## 2023-04-12 NOTE — Progress Notes (Signed)
PROGRESS NOTE   Subjective/Complaints:  No acute complaints.  No events overnight.  Thinks he is sleeping OK but poorly due to frequent nighttime interruptions. Endorsing dizziness with standing, moving' BP again 80s/50s on nursing eval today. Did also get orthostatic with PT later this AM.   He endorses good PO intakes, considerable encouragement, per SLP notes took 3 bites of chicken and then adamant he would not eat more. Eating 0-80% meals currently.   ROS: Denies fevers, chills,  abd pain, vomiting, diarrhea, SOB, cough, chest pain, new weakness or paraesthesias.   + Nausea w/ pills crushed in food-- improved + anxiety--ongoing + Orthostasis - ongoing  Objective:   No results found.   No results for input(s): "WBC", "HGB", "HCT", "PLT" in the last 72 hours.     Recent Labs    04/10/23 0504 04/12/23 0602  NA 131* 128*  K 5.1 4.9  CL 95* 97*  CO2 26 24  GLUCOSE 74 73  BUN 36* 30*  CREATININE 1.05 1.00  CALCIUM 9.2 8.8*      Intake/Output Summary (Last 24 hours) at 04/12/2023 2215 Last data filed at 04/12/2023 2032 Gross per 24 hour  Intake 780.48 ml  Output --  Net 780.48 ml      Pressure Injury 03/15/23 Coccyx Mid;Lower Stage 2 -  Partial thickness loss of dermis presenting as a shallow open injury with a red, pink wound bed without slough. (Active)  03/15/23 0800  Location: Coccyx  Location Orientation: Mid;Lower  Staging: Stage 2 -  Partial thickness loss of dermis presenting as a shallow open injury with a red, pink wound bed without slough.  Wound Description (Comments):   Present on Admission: Yes    Physical Exam: Vital Signs Blood pressure (!) 120/53, pulse (!) 53, temperature 97.8 F (36.6 C), resp. rate 18, height 5\' 8"  (1.727 m), weight 49.1 kg, SpO2 100%.   Constitution: Appropriate appearance for age. No apparent distress. Laying in bed.  Cachectic appearance.  Resp:   clear to  auscultation bilaterally.  On room air, no respiratory distress. Cardio: Regular rate, reg rhythm.   Chronic bilateral lower extremity skin changes.No edema.  Abdomen: soft, Nondistended. Nontender,  +BS throughout, normoactive t  Psych: Appropriate mood and affect.   Skin:   + Right inner thigh/calf with scabbing from vein harvest site - healed + Peripheral IV, C/D/I  MSK: Severe lumbar levoscoliosis.   Neurologic Exam:  Awake, alert, and oriented to self, place, and time + Moderate memory and higher cognitive deficits  - ongoing  Follows all simple commands. Cranial nerves II through XII intact.  Sensation intact.  No fine motor deficits.  No ataxia. Insight: Fair insight into current condition Strength: Antigravity against resistance all 4 extremities grossly 5- out of 5 throughout   - unchanged 1/30  Assessment/Plan: 1. Functional deficits which require 3+ hours per day of interdisciplinary therapy in a comprehensive inpatient rehab setting. Physiatrist is providing close team supervision and 24 hour management of active medical problems listed below. Physiatrist and rehab team continue to assess barriers to discharge/monitor patient progress toward functional and medical goals  Care Tool:  Bathing    Body parts bathed  by patient: Right arm, Left arm, Chest, Abdomen, Front perineal area, Buttocks, Right upper leg, Left upper leg, Face, Right lower leg, Left lower leg   Body parts bathed by helper: Right lower leg, Left lower leg     Bathing assist Assist Level: Supervision/Verbal cueing     Upper Body Dressing/Undressing Upper body dressing   What is the patient wearing?: Pull over shirt    Upper body assist Assist Level: Supervision/Verbal cueing    Lower Body Dressing/Undressing Lower body dressing      What is the patient wearing?: Pants, Underwear/pull up     Lower body assist Assist for lower body dressing: Minimal Assistance - Patient > 75%      Toileting Toileting    Toileting assist Assist for toileting: Minimal Assistance - Patient > 75%     Transfers Chair/bed transfer  Transfers assist     Chair/bed transfer assist level: Contact Guard/Touching assist     Locomotion Ambulation   Ambulation assist      Assist level: 2 helpers Assistive device: No Device Max distance: 5'   Walk 10 feet activity   Assist  Walk 10 feet activity did not occur: Safety/medical concerns  Assist level:  (able to ambulate with RW, but at basline did not use an AD)     Walk 50 feet activity   Assist Walk 50 feet with 2 turns activity did not occur: Safety/medical concerns         Walk 150 feet activity   Assist Walk 150 feet activity did not occur: Safety/medical concerns         Walk 10 feet on uneven surface  activity   Assist Walk 10 feet on uneven surfaces activity did not occur: Safety/medical concerns         Wheelchair     Assist Is the patient using a wheelchair?: Yes Type of Wheelchair: Manual Wheelchair activity did not occur:  (Sternal precautions)  Wheelchair assist level: Dependent - Patient 0% Max wheelchair distance: 150'    Wheelchair 50 feet with 2 turns activity    Assist    Wheelchair 50 feet with 2 turns activity did not occur: Safety/medical concerns       Wheelchair 150 feet activity     Assist  Wheelchair 150 feet activity did not occur: Safety/medical concerns       Blood pressure (!) 120/53, pulse (!) 53, temperature 97.8 F (36.6 C), resp. rate 18, height 5\' 8"  (1.727 m), weight 49.1 kg, SpO2 100%.  Medical Problem List and Plan: 1. 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  Sternal precautions             -patient may shower -ELOS/Goals: 14-18 days supervision to mod I-- Placement/TBD  - 1/23: Per patient/family request, pursuing SNF placement due  to inability of caregivers to provide 24/7 support with ongoing min assist ADLs and cognitive deficits requiring assistance   -Stable to continue CIR 1/14: Needs to be independent at discharge per SW; son has moved out of state and can maybe provide 1-2 weeks on discharge. Patient's son's GF has provided some support in the hospital. Carryover very poor; Max-total assist with sternal precautions, Min A if able to push up. Difficulty with LBD and toiletting tasks - will forget what he is doing with toileting tasks. Per PT has been needing lots of help at home too. Per SW presenting like dementia cog-wise. Doing well with Dys 3. Will  need assistance at discharge; SW to discuss with family.  1-16: Discussed patient dispo requirements with son [name], emphasizing need for full supervision at discharge and anticipating will be greater than a 1 to 2 months needed.  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  After discussion with social work, family still unsure who will come in for training, will likely provide supervision for 2 weeks at home prior to deciding whether patient needs more oversight. 1/21: At bedtime incontinence per nursing. Per SW family not agreeing to care plan involving [name]/her mother, which was offered; this is consistent with patient's wishes as well. Unsure of dispo plan at this time as family is not offering alternatives.  1-22: Family discussed appealing discharge with social work due to feeling patient is not medically optimized.  Per team, primary barriers remain cognitive, not expected to improve further with additional time. 1/28: Per SLP, patient panicked with whole medications even though he complains of crushed; he prefers them to be "dust". Markham Jordan is coming next week. Upgraded to regular diet. Ongoing evening behaviors.   2.  Antithrombotics: -DVT/anticoagulation:   Pharmaceutical: Lovenox 40mg  daily.  Venous Doppler studies negative -antiplatelet therapy: Aspirin 81 mg daily.  3. Pain Management: Oxycodone as needed--no complaints  4. Mood/Behavior/Sleep: Provide emotional support             - antipsychotic agents: Seroquel 25 mg daily at 1000 and 50 mg nightly   - Continue current regimen, can add melatonin 5 mg as needed  -1-13: Will start to wean off antipsychotics; DC AM Seroquel for tomorrow 1-15: Patient tolerated well, change nightly Seroquel to as needed--has resulted in somewhat interrupted sleep but so far has been tolerable.  Noel Christmas states he gets anxious at night sometimes regarding dispo issues. 1/17: No PRNs used.  Continue melatonin, will add nightly as needed trazodone 50 mg retime at bedtime medications to 8 PM given this is his normal bedtime--improved 1-20: Family reporting compulsive calls, anxieties in the early evenings between therapies and sleep.  Patient is sleeping well, no hallucinations or signs of delirium at this time.  Likely early sundowning, adding Atarax for anxiety and informed nursing to be aware of these behaviors. 04-03-21: No reports by nursing overnight, no use of as needed Atarax 1-24: Increasing anxiety and confused behavior since family elected going to SNF; encouraged use of PRN Atarax, start BuSpar 5 mg twice daily 1-28: Adding mirtazapine 7.5 mg nightly for sleep, appetite stimulation, and ongoing anxiety reported by staff. 1/29: DC buspar due to no effect, addition of mirtazepine.  1/30: DC mirtazepine due to persistent orthostasis    5. Neuropsych/cognition: This patient is capable of making decisions on his own behalf.  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  Weaning sedating medications as above.  Will start Aricept 5 mg nightly-theoretical risk of arrhythmia, however on literature review cardiac  events matched placebo on meta-analysis. 1-15: Tolerating Aricept well--and noticing some mild cognitive improvements 1/21: Per cognitive evaluation, patient can make reasonable decisions for himself although underlying dementia requires some reorientation and cueing  6. Skin/Wound Care: Routine skin checks. -His right lower extremity harvest site completing course of Ancef for cellulitis --1/8: transitioned to Augmentin 1-8 to complete 10-day course--appearance slightly more edematous/erythematous 1-14, will get WBC in a.m. and monitor closely, still staying within margins of  prior marking so may just be taking a long time to resolve  1-13: DC PICC line--site looks clean. -1-17: Right clavicle staples removed.  Remove abdominal sutures today. - 1/21: RLE swelling/infection resolved 1-22: Dry skin overlying prior infection site; add Eucerin daily--appearance improving  7. Fluids/Electrolytes/Nutrition/hyponatremia: Routine in and outs with follow-up chemistries, continue supplements.  -1-8: Low albumin, protein; continue current supplemental tube feeds as below.  Encourage protein and p.o. intakes.  Very mild hyponatremia, will recheck on Friday. 1-10: Hyponatremia 129, slight downtrend from 130.  Repeat BMP, serum osmol, urine studies in a.m. to confirm etiology--will be somewhat complicated by use of torsemide.  Placed on 1200 cc fluid restriction; patient is remaining well below this. -03/24/23 Na up to 132, SOsm WNL, urine studies not done yet -03/25/23 Urine Osm still not done... scheduled weekly labs starting Monday - 1/13: Na improved/stable - repeat Wednesday--add magnesium to today's labs--2.1, stable 1-15: NA stable 131; add calcium supplementation, PRN tums, and increase protonic to 40 mg BID for at bedtime nausea 1-17: Added as needed Zofran 4 mg every 8 hours, and miscellaneous nursing order to try and divide out medications and not give all at once crushed in applesauce due to  intolerance of foul taste 1-20: Only 1 use of Zofran over the weekend, no recorded emesis --still having nausea around crushed pills only -04/08/23 BMP done yesterday afternoon, Na 132 stable, BUN 47 and Cr 1.44 which is up-- encourage PO fluids today, recheck tomorrow; will hold off on starting fluids just yet.  1-27: Sodium remains downtrending 129, BUN and creatinine slightly improved.  P.o. intakes appear low Is.  Start 500 cc IV fluids today, repeat in AM. 1-28: Sodium improved to 131 with IV fluids and DC torsemide.  Continue regular monitoring of sodium 1-30: Na back down to 128; DC BP medications, DC mirtazepine, start IVF NS 50 cc/hr for 1 day. CMP in AM. Now off confounding meds will plan for urine studies in AM to eval for SIADH  8.  Acute blood loss anemia.  Follow-up CBC -stable   - HgB improving/stable  9.  COVID-positive.  Completed Paxlovid  -1-17: DC schedule guaifenesin.  10.  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  Seen by vascular surgery Dr Carolynn Sayers and plan outpatient follow-up 1 month  11.  History of VT peri-CABG.  Amiodarone 200 mg BID>>daily, Lanoxin 0.125 mg daily  -Remains with low/stable heart rate -1-14: Patient reporting irregular palpitations during therapies.  None documented, made PT aware so they can document any events during their therapies today.--did not occur per therapies 1/14 1-29: Adjusting blood pressure medications as below; may need to adjust amiodarone to 100 mg daily if no improvement with adjustment of diuretics--ongoing  12.  Hyperlipidemia.  Lipitor 80mg  daily 13.  Diastolic congestive heart failure.  Torsemide 20 mg daily, Cozaar 25 mg daily, Aldactone 25 mg daily. Dapagliflozin 10mg  daily. Monitor for any signs of fluid overload.  Daily weights.  1-24: Weights with ongoing downtrend, per record patient has adequate p.o. intakes and compliant with nutrition shakes between meals, now with reports of  orthostasis and blood pressure 80 over 50s this afternoon.  Reduce torsemide to 10 mg daily, and encourage p.o. fluids. - Orthostatic vitals in AM ordered -1/25-26/25 wt fairly stable; monitor 1/27: Increasingly  low blood pressure, will DC torsemide today and encourage p.o. fluids, initiate 500 cc IV fluids at 50 cc/h today.  BMP in AM. 1-28: Weights have  stabilized today.  Continue monitoring for 1 to 2 days, then consider resumption of torsemide at lower dose. 1/29: Worsening orthostasis despite holding diuretic torsemide, no peripheral signs of fluid overload, although weights have normalized at this point.  DC spironolactone 25 mg daily and Cozaar 25 mg daily, monitor closely.  Encourage p.o. fluids. 1/30: Continued orthostasis, weights stable and euvolemic; add IVF as above Filed Weights   04/10/23 0443 04/11/23 0400 04/12/23 0504  Weight: 48.8 kg 51.5 kg 49.1 kg    14.  Sacral wound.  Wound care nurse follow-up.  Covered with foam dressing -1-16: Caught himself on the back during a toileting transfer today per nursing, resulting in small wound, covered in barrier dressing.  15.  Dysphagia/severe protein calorie malnutrition.  Dysphagia #2 thin liquids.  Nasogastric tube for nutritional support. Just upgraded diet- might not need Cortrak long -Continue supplemental tube feeds as above, encourage p.o. intakes, protein. -1-10: Eating 60 to 90% of meals per documentation.  Discontinue core track, tube feeds.--Continuing to eat well 1/28: P.o. intakes adequate per record, but cachectic appearance and continuing to lose weight.  Somewhat improved with adjustment of diuretics as above.  Will start mirtazapine for appetite stimulation. 1-29: Dietary evaluated, continue current management. 1/30: Continues poor PO intakes despite considerable encouragement; unfortunately not tolerating mirtazepine.   16. Stool regimen: colace 200mg  daily  -1-17: DC liquid Colace, having regular bowel  movements  -03/31/23 no BM in 3 days, will restart colace at 100mg  daily for now.  -04/01/23 pt states BM yesterday but none documented since 1/15, nurse documentation states LBM 1/16, unclear which is correct; asked nursing to please document if he's had BM today, if none today then would advance bowel regimen --1-20: Increase Colace to 100 mg twice daily, add Senokot S1 tab at night  -1/24 LBM  -04/07/23 no good BM since 1/21, just a smear on 1/24; hard palpable stool in lower abdomen, pt feels it in rectum; will do sorbitol then mineral oil enema. Hopefully this improves it.  -04/08/23 multiple BMs yesterday with enema/meds, feels better; cont regimen   -01/27 LBM  17. Thrombocytosis.  resolved   LOS: 23 days A FACE TO FACE EVALUATION WAS PERFORMED  Angelina Sheriff 04/12/2023, 10:15 PM

## 2023-04-12 NOTE — Progress Notes (Signed)
Occupational Therapy Session Note  Patient Details  Name: Walter Vargas MRN: [id number] Date of Birth: 01-30-1946  Today's Date: 04/12/2023 OT Individual Time: 4098-1191 OT Individual Time Calculation (min): 44 min    Short Term Goals: Week 3:  OT Short Term Goal 1 (Week 3): Pt will complete toileting tasks with min A OT Short Term Goal 2 (Week 3): Pt will utilize external cues to properly place hand on w/c during sit > stand OT Short Term Goal 3 (Week 3): Pt will complete full ADL routine with no more than 1 rest break to demo improved activity tolerance  Skilled Therapeutic Interventions/Progress Updates:  Pt greeted supine in bed with PTA present, pt agreeable to OT intervention.    Filled out memory book with pt able to state correct date. Pt also able to recount tasks completed in prior PT session. Pt able to state 1/3 things that were completed during OT session.   Transfers/bed mobility/functional mobility: pt completed supine>sit with supervision. Sit>stand from EOB and w/c with supervision. Pt completed ambulatory transfer to w/c at sink ~ 5 ft with RW and CGA.    ADLs:  Grooming: pt completed seated oral care and face washing at sink with supervision. + time d/t fatigue.  UB dressing:donned OH shirt with + time to orient shirt correctly and very light MIN A to pull shirt fully OH LB dressing: pt donned pants with MAX A for time mgmt   Transfers: ambulatory toilet transfer with Rw and CGA  Toileting: pt completed 3/3 toileting tasks with CGA for standing balance   Education: continued education provided on energy conservation strategies for ADLs.  Vitals: BP from supine at end of session 122/70 ( 85) HR 55    Ended session with pt supine in bed with all needs within reach and bed alarm activated.                    Therapy Documentation Precautions:  Precautions Precautions: Fall, Sternal, Other (comment) Precaution Booklet Issued: No Precaution Comments: cortrak,  sternal precautions Restrictions Weight Bearing Restrictions Per Provider Order: No RUE Weight Bearing Per Provider Order: Weight bearing as tolerated LUE Weight Bearing Per Provider Order: Weight bearing as tolerated  Pain: No pain reported during session    Therapy/Group: Individual Therapy  Pollyann Glen [facility] 04/12/2023, 12:08 PM

## 2023-04-12 NOTE — Progress Notes (Addendum)
   04/12/23 0516  Orthostatic Lying   BP- Lying 105/58  Pulse- Lying 50  Orthostatic Sitting  BP- Sitting (!) 89/55  Pulse- Sitting 57  Orthostatic Standing at 0 minutes  BP- Standing at 0 minutes (!) 82/54  Pulse- Standing at 0 minutes 107  Orthostatic Standing at 3 minutes  BP- Standing at 3 minutes  (unable to continue to stand)  Pulse- Standing at 3 minutes  (unable to continue to stand)   Orthostatic Vitals performed with TED hose on.  Patient asymptomatic during vitals.

## 2023-04-13 LAB — COMPREHENSIVE METABOLIC PANEL
ALT: 28 U/L (ref 0–44)
AST: 31 U/L (ref 15–41)
Albumin: 3 g/dL — ABNORMAL LOW (ref 3.5–5.0)
Alkaline Phosphatase: 87 U/L (ref 38–126)
Anion gap: 11 (ref 5–15)
BUN: 27 mg/dL — ABNORMAL HIGH (ref 8–23)
CO2: 19 mmol/L — ABNORMAL LOW (ref 22–32)
Calcium: 9.1 mg/dL (ref 8.9–10.3)
Chloride: 101 mmol/L (ref 98–111)
Creatinine, Ser: 0.91 mg/dL (ref 0.61–1.24)
GFR, Estimated: >60 mL/min (ref >60)
Glucose, Bld: 68 mg/dL — ABNORMAL LOW (ref 70–99)
Potassium: 5.6 mmol/L — ABNORMAL HIGH (ref 3.5–5.1)
Sodium: 131 mmol/L — ABNORMAL LOW (ref 135–145)
Total Bilirubin: 1.1 mg/dL (ref 0.0–1.2)
Total Protein: 6.5 g/dL (ref 6.5–8.1)

## 2023-04-13 LAB — OSMOLALITY, URINE: Osmolality, Ur: 666 mosm/kg (ref 300–900)

## 2023-04-13 LAB — SODIUM, URINE, RANDOM: Sodium, Ur: 77 mmol/L

## 2023-04-13 LAB — PHOSPHORUS: Phosphorus: 3.1 mg/dL (ref 2.5–4.6)

## 2023-04-13 LAB — OSMOLALITY: Osmolality: 287 mosm/kg (ref 275–295)

## 2023-04-13 LAB — MAGNESIUM: Magnesium: 2.1 mg/dL (ref 1.7–2.4)

## 2023-04-13 MED ORDER — SODIUM CHLORIDE 0.9 % IV SOLN: INTRAVENOUS | Status: AC

## 2023-04-13 NOTE — Progress Notes (Signed)
Occupational Therapy Weekly Progress Note  Patient Details  Name: Walter Vargas MRN: [id number] Date of Birth: 1945-09-10  Beginning of progress report period: 04/06/23 End of progress report period: 04/13/23  Today's Date: 04/13/2023 OT Individual Time: 9147-8295 OT Individual Time Calculation (min): 57 min   Patient has met 1 of 3 short term goals. He continues to be limited by cognitive deficits that impact his ability to carryover safety education. He requires ongoing cueing for sequencing, problem solving, memory, and safety awareness. He is able to get through his ADL routine without sequencing cues but once he is off his routine just a little bit he is unable to problem solve getting back on track, for example, if approaching the wheelchair different than he usually does, he is unable to sequence turning and using his RW.   Patient continues to demonstrate the following deficits: muscle weakness, decreased cardiorespiratoy endurance, decreased initiation, decreased problem solving, decreased safety awareness, decreased memory, and delayed processing, and decreased standing balance, decreased postural control, and decreased balance strategies and therefore will continue to benefit from skilled OT intervention to enhance overall performance with BADL and Reduce care partner burden.  Patient progressing toward long term goals. Planning on SNF d/c now.   OT Short Term Goals Week 3:  OT Short Term Goal 1 (Week 3): Pt will complete toileting tasks with min A OT Short Term Goal 1 - Progress (Week 3): Met OT Short Term Goal 2 (Week 3): Pt will utilize external cues to properly place hand on w/c during sit > stand OT Short Term Goal 2 - Progress (Week 3): Progressing toward goal OT Short Term Goal 3 (Week 3): Pt will complete full ADL routine with no more than 1 rest break to demo improved activity tolerance OT Short Term Goal 3 - Progress (Week 3): Progressing toward goal Week 4:  OT Short Term  Goal 1 (Week 4): Pt will complete toileting tasks with CGA OT Short Term Goal 2 (Week 4): Pt will complete LB dressing with CGA OT Short Term Goal 3 (Week 4): Pt will properly place UE during sit <> stands with external cueing  Skilled Therapeutic Interventions/Progress Updates:    Pt received supine with no c/o pain and agreeable to OT session. He ate breakfast with OT encouragement to increase PO intake. Full supervision provided with no issues. He came to EOB with (S). BP assessed- below. He completed sit > stand with CGA using the RW, min cueing for hand placement. He completed functional mobility to the sink with the RW, CGA. Pt very incontinent of urine in brief- unaware. He stood and completed peri hygiene with min A, he did have a posterior LOB that required mod A to recover. He was set up to brush teeth and then OT assisted with shaving task. He completed LB dressing with mod A. He was left sitting up with all needs met, chair alarm set.   Sitting EOB BP: 94/82 Standing: 104/72  Therapy Documentation Precautions:  Precautions Precautions: Fall, Sternal, Other (comment) Precaution Booklet Issued: No Precaution Comments: cortrak, sternal precautions Restrictions Weight Bearing Restrictions Per Provider Order: No RUE Weight Bearing Per Provider Order: Weight bearing as tolerated LUE Weight Bearing Per Provider Order: Weight bearing as tolerated  Therapy/Group: Individual Therapy  [name] 04/13/2023, 7:48 AM

## 2023-04-13 NOTE — Progress Notes (Signed)
Physical Therapy Session Note  Patient Details  Name: Walter Vargas MRN: [id number] Date of Birth: [birth year]  Today's Date: 04/13/2023 PT Individual Time: 1105-1200 PT Individual Time Calculation (min): 55 min   Short Term Goals: Week 3:  PT Short Term Goal 1 (Week 3): STGs = LTGs  Skilled Therapeutic Interventions/Progress Updates:     Pt received seated in WC and agrees to therapy. No complaint of pain. WC transport to gym. Sit to stand and stand step transfer to mat table with minA at trunk and cues for sequencing and positioning. Pt stands and performs alternating foot taps on 3" step with minA increasing to modA due to posterior lean. PT provides cues for posture, anterior weight shifting and lateral weight shifting. Pt completes x8 total prior to rest break. Following extended seated rest break, pt performs same activity with improved weight distribution and balance, with pt completing x20. Initial 19 performed with minA but pt has posterior LOB on final rep, requiring modA for safety.   Activity progressed by having pt perform step ups on 3" step with minA/modA and cues for weight shifting and hip extension. Pt completes 2x10 total. Seated rest breaks.   Pt left seated in WC with alarm intact and all needs within reach.   Pt's BP assessed  Sitting: 103/65 Standing: 84/44  Therapy Documentation Precautions:  Precautions Precautions: Fall, Sternal, Other (comment) Precaution Booklet Issued: No Precaution Comments: cortrak, sternal precautions Restrictions Weight Bearing Restrictions Per Provider Order: No RUE Weight Bearing Per Provider Order: Weight bearing as tolerated LUE Weight Bearing Per Provider Order: Weight bearing as tolerated    Therapy/Group: Individual Therapy  Beau Fanny, PT, DPT 04/13/2023, 4:48 PM

## 2023-04-13 NOTE — Progress Notes (Signed)
Physical Therapy Session Note  Patient Details  Name: Walter Vargas MRN: [id number] Date of Birth: [birth year]  Today's Date: 04/13/2023 PT Individual Time: 1300-1342 PT Individual Time Calculation (min): 42 min   Short Term Goals: Week 1:  PT Short Term Goal 1 (Week 1): Pt will complete bed mobility with CGA. PT Short Term Goal 1 - Progress (Week 1): Met PT Short Term Goal 2 (Week 1): Pt will completes sit to stand with minA and LRAD. PT Short Term Goal 2 - Progress (Week 1): Met PT Short Term Goal 3 (Week 1): Pt will complete bed to chair with minA and LRAD. PT Short Term Goal 3 - Progress (Week 1): Met PT Short Term Goal 4 (Week 1): Pt will ambulate x50' with minA and LRAD. PT Short Term Goal 4 - Progress (Week 1): Met Week 2:  PT Short Term Goal 1 (Week 2): STGs = LTGs Week 3:  PT Short Term Goal 1 (Week 3): STGs = LTGs   Skilled Therapeutic Interventions/Progress Updates:    Session focused on functional gait training with RW, dynamic standing balance activities, and dynamic gait through obstacles for home environment mobility. Pt performed sit <> stands with supervision to CGA, with improved anterior weightshift compared to AM session. Does require cues at times for hand placement, sequencing. Gait with RW > 150' throughout session with overall CGA with occasional need to stop patient to reset positioning inside of RW (feet get totally outside during turning or more dynamic mobility) and flexed posture. Pt performed dynamic gait with similar gait characteristics as described above. Balance retraining while standing on compliant wedge to promote pt to activate PF to react to posterior LOB x 3 trials increasing difficulty with overall min assist to correct. Returned back to room at end of session with all needs in reach and RN in room.  Therapy Documentation Precautions:  Precautions Precautions: Fall, Sternal, Other (comment) Precaution Booklet Issued: No Precaution Comments:  cortrak, sternal precautions Restrictions Weight Bearing Restrictions Per Provider Order: No RUE Weight Bearing Per Provider Order: Weight bearing as tolerated LUE Weight Bearing Per Provider Order: Weight bearing as tolerated    Pain:  No reports of pain.    Therapy/Group: Individual Therapy  Karolee Stamps Darrol Poke, PT, DPT, CBIS  04/13/2023, 1:45 PM

## 2023-04-13 NOTE — Progress Notes (Signed)
PROGRESS NOTE   Subjective/Complaints:  No acute complaints.  No events overnight.  States he slept very well overnight, was the first time in a while.  Potassium 5.6 this a.m., BUN slightly down, other labs stable.  Sodium improved to 131.  Serum osmolality 287, within normal limits.  Urine osmole's, sodium pending. Heart rate went down to 47 overnight, otherwise has remained low 50s. Blood sugar has been in the 70s over the last couple of checks. Small, hard BMs 1-30 and 1-31.  Continue to be orthostatic with therapies today, despite IV fluids overnight.  ROS: Denies fevers, chills,  abd pain, vomiting, diarrhea, SOB, cough, chest pain, new weakness or paraesthesias.   + Nausea w/ pills crushed in food-- improved + anxiety--ongoing + Orthostasis - ongoing  Objective:   No results found.   No results for input(s): "WBC", "HGB", "HCT", "PLT" in the last 72 hours.     Recent Labs    04/12/23 0602 04/13/23 0631  NA 128* 131*  K 4.9 5.6*  CL 97* 101  CO2 24 19*  GLUCOSE 73 68*  BUN 30* 27*  CREATININE 1.00 0.91  CALCIUM 8.8* 9.1      Intake/Output Summary (Last 24 hours) at 04/13/2023 1000 Last data filed at 04/13/2023 0800 Gross per 24 hour  Intake 662.48 ml  Output --  Net 662.48 ml      Pressure Injury 03/15/23 Coccyx Mid;Lower Stage 2 -  Partial thickness loss of dermis presenting as a shallow open injury with a red, pink wound bed without slough. (Active)  03/15/23 0800  Location: Coccyx  Location Orientation: Mid;Lower  Staging: Stage 2 -  Partial thickness loss of dermis presenting as a shallow open injury with a red, pink wound bed without slough.  Wound Description (Comments):   Present on Admission: Yes    Physical Exam: Vital Signs Blood pressure (!) 108/52, pulse (!) 47, temperature (!) 97.5 F (36.4 C), resp. rate 15, height 5\' 8"  (1.727 m), weight 48.8 kg, SpO2 100%.   Constitution:  Appropriate appearance for age. No apparent distress. Laying in bed.  Cachectic appearance.   Resp:   clear to auscultation bilaterally.  On room air, no respiratory distress. Cardio: Regular rate, reg rhythm.   Chronic bilateral lower extremity skin changes.No edema.  Abdomen: soft, Nondistended. Nontender,  +BS throughout, normoactive t  Psych: Appropriate mood and affect.  Mildly anxious. Skin:   + Right inner thigh/calf with scabbing from vein harvest site - healed + Peripheral IV, C/D/I  MSK: Severe lumbar levoscoliosis.   Neurologic Exam:  Awake, alert, and oriented to self, place, and time + Moderate memory and higher cognitive deficits  - ongoing.  Did remember some remote conversations with provider today.  Follows all simple commands. Cranial nerves II through XII intact.  Sensation intact.  No fine motor deficits.  No ataxia. Insight: Fair insight into current condition Strength: Antigravity against resistance all 4 extremities grossly 5- out of 5 throughout   - unchanged 1/31  Assessment/Plan: 1. Functional deficits which require 3+ hours per day of interdisciplinary therapy in a comprehensive inpatient rehab setting. Physiatrist is providing close team supervision and 24 hour management of  active medical problems listed below. Physiatrist and rehab team continue to assess barriers to discharge/monitor patient progress toward functional and medical goals  Care Tool:  Bathing    Body parts bathed by patient: Right arm, Left arm, Chest, Abdomen, Front perineal area, Buttocks, Right upper leg, Left upper leg, Face, Right lower leg, Left lower leg   Body parts bathed by helper: Right lower leg, Left lower leg     Bathing assist Assist Level: Supervision/Verbal cueing     Upper Body Dressing/Undressing Upper body dressing   What is the patient wearing?: Pull over shirt    Upper body assist Assist Level: Supervision/Verbal cueing    Lower Body  Dressing/Undressing Lower body dressing      What is the patient wearing?: Pants, Underwear/pull up     Lower body assist Assist for lower body dressing: Minimal Assistance - Patient > 75%     Toileting Toileting    Toileting assist Assist for toileting: Minimal Assistance - Patient > 75%     Transfers Chair/bed transfer  Transfers assist     Chair/bed transfer assist level: Contact Guard/Touching assist     Locomotion Ambulation   Ambulation assist      Assist level: 2 helpers Assistive device: No Device Max distance: 5'   Walk 10 feet activity   Assist  Walk 10 feet activity did not occur: Safety/medical concerns  Assist level:  (able to ambulate with RW, but at basline did not use an AD)     Walk 50 feet activity   Assist Walk 50 feet with 2 turns activity did not occur: Safety/medical concerns         Walk 150 feet activity   Assist Walk 150 feet activity did not occur: Safety/medical concerns         Walk 10 feet on uneven surface  activity   Assist Walk 10 feet on uneven surfaces activity did not occur: Safety/medical concerns         Wheelchair     Assist Is the patient using a wheelchair?: Yes Type of Wheelchair: Manual Wheelchair activity did not occur:  (Sternal precautions)  Wheelchair assist level: Dependent - Patient 0% Max wheelchair distance: 150'    Wheelchair 50 feet with 2 turns activity    Assist    Wheelchair 50 feet with 2 turns activity did not occur: Safety/medical concerns       Wheelchair 150 feet activity     Assist  Wheelchair 150 feet activity did not occur: Safety/medical concerns       Blood pressure (!) 108/52, pulse (!) 47, temperature (!) 97.5 F (36.4 C), resp. rate 15, height 5\' 8"  (1.727 m), weight 48.8 kg, SpO2 100%.  Medical Problem List and Plan: 1. 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  Sternal precautions             -patient may shower -ELOS/Goals: 14-18 days supervision to mod I-- Placement/TBD  - 1/23: Per patient/family request, pursuing SNF placement due to inability of caregivers to provide 24/7 support with ongoing min assist ADLs and cognitive deficits requiring assistance   -Stable to continue CIR 1/14: Needs to be independent at discharge per SW; son has moved out of state and can maybe provide 1-2 weeks on discharge. Patient's son's GF has provided some support in the hospital. Carryover very poor; Max-total assist with sternal precautions, Min A if able to push up. Difficulty with LBD and toiletting  tasks - will forget what he is doing with toileting tasks. Per PT has been needing lots of help at home too. Per SW presenting like dementia cog-wise. Doing well with Dys 3. Will need assistance at discharge; SW to discuss with family.  1-16: Discussed patient dispo requirements with son [name], emphasizing need for full supervision at discharge and anticipating will be greater than a 1 to 2 months needed.  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  After discussion with social work, family still unsure who will come in for training, will likely provide supervision for 2 weeks at home prior to deciding whether patient needs more oversight. 1/21: At bedtime incontinence per nursing. Per SW family not agreeing to care plan involving [name]/her mother, which was offered; this is consistent with patient's wishes as well. Unsure of dispo plan at this time as family is not offering alternatives.  1-22: Family discussed appealing discharge with social work due to feeling patient is not medically optimized.  Per team, primary barriers remain cognitive, not expected to improve further with additional time. 1/28: Per SLP, patient panicked with  whole medications even though he complains of crushed; he prefers them to be "dust". Markham Jordan is coming next week. Upgraded to regular diet. Ongoing evening behaviors.   2.  Antithrombotics: -DVT/anticoagulation:  Pharmaceutical: Lovenox 40mg  daily.  Venous Doppler studies negative -antiplatelet therapy: Aspirin 81 mg daily.  3. Pain Management: Oxycodone as needed--no complaints  4. Mood/Behavior/Sleep: Provide emotional support             - antipsychotic agents: Seroquel 25 mg daily at 1000 and 50 mg nightly   - Continue current regimen, can add melatonin 5 mg as needed  -1-13: Will start to wean off antipsychotics; DC AM Seroquel for tomorrow 1-15: Patient tolerated well, change nightly Seroquel to as needed--has resulted in somewhat interrupted sleep but so far has been tolerable.  Noel Christmas states he gets anxious at night sometimes regarding dispo issues. 1/17: No PRNs used.  Continue melatonin, will add nightly as needed trazodone 50 mg retime at bedtime medications to 8 PM given this is his normal bedtime--improved 1-20: Family reporting compulsive calls, anxieties in the early evenings between therapies and sleep.  Patient is sleeping well, no hallucinations or signs of delirium at this time.  Likely early sundowning, adding Atarax for anxiety and informed nursing to be aware of these behaviors. 04-03-21: No reports by nursing overnight, no use of as needed Atarax 1-24: Increasing anxiety and confused behavior since family elected going to SNF; encouraged use of PRN Atarax, start BuSpar 5 mg twice daily 1-28: Adding mirtazapine 7.5 mg nightly for sleep, appetite stimulation, and ongoing anxiety reported by staff. 1/29: DC buspar due to no effect, addition of mirtazepine.  1/30: DC mirtazepine due to persistent orthostasis  1/31: Endorses he slept well overnight, continue current management   5. Neuropsych/cognition: This patient is capable of making decisions on his own  behalf.  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  Weaning sedating medications as above.  Will start Aricept 5 mg nightly-theoretical risk of arrhythmia, however on literature review cardiac events matched placebo on meta-analysis. 1-15: Tolerating Aricept well--and noticing some mild cognitive improvements 1/21: Per cognitive evaluation, patient can make reasonable decisions for himself although underlying dementia requires some reorientation and cueing  6.  Skin/Wound Care: Routine skin checks. -His right lower extremity harvest site completing course of Ancef for cellulitis --1/8: transitioned to Augmentin 1-8 to complete 10-day course--appearance slightly more edematous/erythematous 1-14, will get WBC in a.m. and monitor closely, still staying within margins of prior marking so may just be taking a long time to resolve  1-13: DC PICC line--site looks clean. -1-17: Right clavicle staples removed.  Remove abdominal sutures today. - 1/21: RLE swelling/infection resolved 1-22: Dry skin overlying prior infection site; add Eucerin daily--appearance improving  7. Fluids/Electrolytes/Nutrition/hyponatremia: Routine in and outs with follow-up chemistries, continue supplements.  -1-8: Low albumin, protein; continue current supplemental tube feeds as below.  Encourage protein and p.o. intakes.  Very mild hyponatremia, will recheck on Friday. 1-10: Hyponatremia 129, slight downtrend from 130.  Repeat BMP, serum osmol, urine studies in a.m. to confirm etiology--will be somewhat complicated by use of torsemide.  Placed on 1200 cc fluid restriction; patient is remaining well below this. -03/24/23 Na up to 132, SOsm WNL, urine studies not done yet -03/25/23 Urine Osm still not done... scheduled weekly labs starting Monday - 1/13: Na improved/stable - repeat Wednesday--add magnesium to today's labs--2.1,  stable 1-15: NA stable 131; add calcium supplementation, PRN tums, and increase protonic to 40 mg BID for at bedtime nausea 1-17: Added as needed Zofran 4 mg every 8 hours, and miscellaneous nursing order to try and divide out medications and not give all at once crushed in applesauce due to intolerance of foul taste 1-20: Only 1 use of Zofran over the weekend, no recorded emesis --still having nausea around crushed pills only -04/08/23 BMP done yesterday afternoon, Na 132 stable, BUN 47 and Cr 1.44 which is up-- encourage PO fluids today, recheck tomorrow; will hold off on starting fluids just yet.  1-27: Sodium remains downtrending 129, BUN and creatinine slightly improved.  P.o. intakes appear low Is.  Start 500 cc IV fluids today, repeat in AM. 1-28: Sodium improved to 131 with IV fluids and DC torsemide.  Continue regular monitoring of sodium 1-30: Na back down to 128; DC BP medications, DC mirtazepine, start IVF NS 50 cc/hr for 1 day. CMP in AM. Now off confounding meds will plan for urine studies in AM to eval for SIADH 1-31: NA back to 131.  Unfortunately, still orthostatic, continue IV fluids.  Serum osmolality within normal limits, awaiting urine studies for full picture.  8.  Acute blood loss anemia.  Follow-up CBC -stable   - HgB improving/stable  9.  COVID-positive.  Completed Paxlovid  -1-17: DC schedule guaifenesin.  10.  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  Seen by vascular surgery Dr Carolynn Sayers and plan outpatient follow-up 1 month  11.  History of VT peri-CABG.  Amiodarone 200 mg BID>>daily, Lanoxin 0.125 mg daily  -Remains with low/stable heart rate -1-14: Patient reporting irregular palpitations during therapies.  None documented, made PT aware so they can document any events during their therapies today.--did not occur per therapies 1/14 1-29: Adjusting blood pressure medications as below; may need to adjust amiodarone to 100 mg  daily if no improvement with adjustment of diuretics--ongoing 1-31: Reached out to cardiology regarding ongoing orthostasis with multiple recent medication adjustments.  They have recommended discontinuation of Farxiga, as this can cause orthostasis, also possible contributed to hyperkalemia.  Will do this today, continue other medications at current dose  12.  Hyperlipidemia.  Lipitor 80mg  daily 13.  Diastolic congestive heart failure.  Torsemide 20  mg daily, Cozaar 25 mg daily, Aldactone 25 mg daily. Dapagliflozin 10mg  daily. Monitor for any signs of fluid overload.  Daily weights.  1-24: Weights with ongoing downtrend, per record patient has adequate p.o. intakes and compliant with nutrition shakes between meals, now with reports of orthostasis and blood pressure 80 over 50s this afternoon.  Reduce torsemide to 10 mg daily, and encourage p.o. fluids. - Orthostatic vitals in AM ordered -1/25-26/25 wt fairly stable; monitor 1/27: Increasingly  low blood pressure, will DC torsemide today and encourage p.o. fluids, initiate 500 cc IV fluids at 50 cc/h today.  BMP in AM. 1-28: Weights have stabilized today.  Continue monitoring for 1 to 2 days, then consider resumption of torsemide at lower dose. 1/29: Worsening orthostasis despite holding diuretic torsemide, no peripheral signs of fluid overload, although weights have normalized at this point.  DC spironolactone 25 mg daily and Cozaar 25 mg daily, monitor closely.  Encourage p.o. fluids. 1/30: Continued orthostasis, weights stable and euvolemic; add IVF as above 1-31: Blood pressure mildly improved with IV fluid, but continues to be orthostatic.  Continue for today, continue to emphasize to patient encourage of p.o. fluid intake as well.  DC Farxiga per cardiology as above. Filed Weights   04/11/23 0400 04/12/23 0504 04/13/23 0500  Weight: 51.5 kg 49.1 kg 48.8 kg    14.  Sacral wound.  Wound care nurse follow-up.  Covered with foam dressing -1-16:  Caught himself on the back during a toileting transfer today per nursing, resulting in small wound, covered in barrier dressing.  15.  Dysphagia/severe protein calorie malnutrition.  Dysphagia #2 thin liquids.   -Cortrak on intake; Continue supplemental tube feeds as above, encourage p.o. intakes, protein. -1-10: Eating 60 to 90% of meals per documentation.  Discontinue core track, tube feeds.--Continuing to eat well 1/28: P.o. intakes adequate per record, but cachectic appearance and continuing to lose weight.  Somewhat improved with adjustment of diuretics as above.  Will start mirtazapine for appetite stimulation. 1-29: Dietary evaluated, continue current management with BID ensure shakes 1/30: Continues poor PO intakes despite considerable encouragement; unfortunately not tolerating mirtazepine 1-31: Family bringing in food that he likes today.  Encouraged consumption of protein shakes, Magic cups between meals.   Has lost ~20 lbs since admission 1/8; some due to diuresis, eating 25-80% meals (avg 60%) but continues to lose weight despite decent intakes - if no improvement with management as above , may need workup for more insidious causes.   16. Stool regimen: colace 200mg  daily  -1-17: DC liquid Colace, having regular bowel movements  -03/31/23 no BM in 3 days, will restart colace at 100mg  daily for now.  -04/01/23 pt states BM yesterday but none documented since 1/15, nurse documentation states LBM 1/16, unclear which is correct; asked nursing to please document if he's had BM today, if none today then would advance bowel regimen --1-20: Increase Colace to 100 mg twice daily, add Senokot S1 tab at night  -1/24 LBM  -04/07/23 no good BM since 1/21, just a smear on 1/24; hard palpable stool in lower abdomen, pt feels it in rectum; will do sorbitol then mineral oil enema. Hopefully this improves it.  -04/08/23 multiple BMs yesterday with enema/meds, feels better; cont regimen   -01/30 LBM  17.  Thrombocytosis.  resolved   LOS: 24 days A FACE TO FACE EVALUATION WAS PERFORMED  Angelina Sheriff 04/13/2023, 10:00 AM

## 2023-04-13 NOTE — Progress Notes (Signed)
Physical Therapy Session Note  Patient Details  Name: Walter Vargas MRN: [id number] Date of Birth: 03-24-1945  Today's Date: 04/13/2023 PT Individual Time: 0935-1005 PT Individual Time Calculation (min): 30 min   Short Term Goals: Week 1:  PT Short Term Goal 1 (Week 1): Pt will complete bed mobility with CGA. PT Short Term Goal 1 - Progress (Week 1): Met PT Short Term Goal 2 (Week 1): Pt will completes sit to stand with minA and LRAD. PT Short Term Goal 2 - Progress (Week 1): Met PT Short Term Goal 3 (Week 1): Pt will complete bed to chair with minA and LRAD. PT Short Term Goal 3 - Progress (Week 1): Met PT Short Term Goal 4 (Week 1): Pt will ambulate x50' with minA and LRAD. PT Short Term Goal 4 - Progress (Week 1): Met Week 2:  PT Short Term Goal 1 (Week 2): STGs = LTGs Week 3:  PT Short Term Goal 1 (Week 3): STGs = LTGs   Skilled Therapeutic Interventions/Progress Updates:    Pt presents in w/c with visitor, [name]. Pt declines going to gym this morning because it can be "too depressing and I go there everyday". Engaged in seated therex for functional strengthening to aid with overall mobility and transfers. Pt performed 4# straight weight bicep curls x 15 reps x 2 sets and then 1 set each BLE of LAQ with 5-10 sec hold - cues to stay on on task and redirect as pt worried about paying bills. Pt with urgency to urinate so performed sit <> stands and use of urinal in standing, initial min assist x 2 reps for sit > stand due to posterior lean with facilitation for anterior weightshift and cues for hand placement. Maintains dynamic standing balance with close supervision to CGA during clothing management and using urinal. BP taken in standing, asymptomatic. See below for details.   Therapy Documentation Precautions:  Precautions Precautions: Fall, Sternal, Other (comment) Precaution Booklet Issued: No Precaution Comments: cortrak, sternal precautions Restrictions Weight Bearing  Restrictions Per Provider Order: No RUE Weight Bearing Per Provider Order: Weight bearing as tolerated LUE Weight Bearing Per Provider Order: Weight bearing as tolerated  Vital Signs:  BP = 90/51 mmHg in standing; asymptomatic HR = 60 bpm Pain: Pain Assessment Pain Scale: 0-10 Pain Score: 0-No pain     Therapy/Group: Individual Therapy  Karolee Stamps Darrol Poke, PT, DPT, CBIS  04/13/2023, 10:25 AM

## 2023-04-13 NOTE — Progress Notes (Signed)
Patient ID: Walter Vargas, male   DOB: 1945/05/12, 78 y.o.   MRN: [id number]  SW received updates from financial navigator reporting will make an effort to follow-up with family friend today as he had to leave early yesterday.   SW left message for Admissions with [facility] to discuss referral and waiting on follow-up.   SW spoke with Janie/Admissions with [facility] to discuss referral. Reports will discuss with Tammy and follow-up. Currently there are no LTC beds available.   SW left message for family friend [name] to inform on updates from financial navigator. SW encouraged follow-up if needed.   [name], MSW, LCSW Office: [phone] Cell: [phone] Fax: [phone]

## 2023-04-14 NOTE — Progress Notes (Signed)
PROGRESS NOTE   Subjective/Complaints:  Pt doing ok today, but slept poorly he thinks? Pain manageable, doesn't really complain of anything significant today.  Unsure of LBM, looks like he had one yesterday night but says it was small.  Urinating ok.  Denies any other complaints or concerns today.   ROS: Denies fevers, chills,  abd pain, vomiting, diarrhea, SOB, cough, chest pain, new weakness or paraesthesias.   + Nausea w/ pills crushed in food-- improved + anxiety--ongoing + Orthostasis - ongoing  Objective:   No results found.   No results for input(s): "WBC", "HGB", "HCT", "PLT" in the last 72 hours.     Recent Labs    04/12/23 0602 04/13/23 0631  NA 128* 131*  K 4.9 5.6*  CL 97* 101  CO2 24 19*  GLUCOSE 73 68*  BUN 30* 27*  CREATININE 1.00 0.91  CALCIUM 8.8* 9.1      Intake/Output Summary (Last 24 hours) at 04/14/2023 1149 Last data filed at 04/14/2023 0715 Gross per 24 hour  Intake 236 ml  Output 450 ml  Net -214 ml      Pressure Injury 03/15/23 Coccyx Mid;Lower Stage 2 -  Partial thickness loss of dermis presenting as a shallow open injury with a red, pink wound bed without slough. (Active)  03/15/23 0800  Location: Coccyx  Location Orientation: Mid;Lower  Staging: Stage 2 -  Partial thickness loss of dermis presenting as a shallow open injury with a red, pink wound bed without slough.  Wound Description (Comments):   Present on Admission: Yes    Physical Exam: Vital Signs Blood pressure (!) 109/51, pulse (!) 48, temperature (!) 97.4 F (36.3 C), resp. rate 18, height 5\' 8"  (1.727 m), weight 50.1 kg, SpO2 100%.   Constitution: Appropriate appearance for age. No apparent distress. Laying in bed.  Cachectic appearance.  HENT: MMM Resp:   clear to auscultation bilaterally.  On room air, no respiratory distress. Cardio: bradycardic rate, reg rhythm.   Chronic bilateral lower extremity skin  changes.No edema.  Abdomen: soft, Nondistended. Nontender,  +BS throughout, normoactive  Psych: Appropriate mood and affect.  Mildly anxious.  PRIOR EXAMS: Skin:   + Right inner thigh/calf with scabbing from vein harvest site - healed + Peripheral IV, C/D/I MSK: Severe lumbar levoscoliosis.   Neurologic Exam:  Awake, alert, and oriented to self, place, and time + Moderate memory and higher cognitive deficits  - ongoing.  Did remember some remote conversations with provider today.  Follows all simple commands. Cranial nerves II through XII intact.  Sensation intact.  No fine motor deficits.  No ataxia. Insight: Fair insight into current condition Strength: Antigravity against resistance all 4 extremities grossly 5- out of 5 throughout   - unchanged 1/31  Assessment/Plan: 1. Functional deficits which require 3+ hours per day of interdisciplinary therapy in a comprehensive inpatient rehab setting. Physiatrist is providing close team supervision and 24 hour management of active medical problems listed below. Physiatrist and rehab team continue to assess barriers to discharge/monitor patient progress toward functional and medical goals  Care Tool:  Bathing    Body parts bathed by patient: Right arm, Left arm, Chest, Abdomen, Front perineal  area, Buttocks, Right upper leg, Left upper leg, Face, Right lower leg, Left lower leg   Body parts bathed by helper: Right lower leg, Left lower leg     Bathing assist Assist Level: Supervision/Verbal cueing     Upper Body Dressing/Undressing Upper body dressing   What is the patient wearing?: Pull over shirt    Upper body assist Assist Level: Supervision/Verbal cueing    Lower Body Dressing/Undressing Lower body dressing      What is the patient wearing?: Pants, Underwear/pull up     Lower body assist Assist for lower body dressing: Minimal Assistance - Patient > 75%     Toileting Toileting    Toileting assist Assist for  toileting: Minimal Assistance - Patient > 75%     Transfers Chair/bed transfer  Transfers assist     Chair/bed transfer assist level: Contact Guard/Touching assist     Locomotion Ambulation   Ambulation assist      Assist level: Contact Guard/Touching assist Assistive device: Walker-rolling Max distance: 150'   Walk 10 feet activity   Assist  Walk 10 feet activity did not occur: Safety/medical concerns  Assist level: Contact Guard/Touching assist Assistive device: Walker-rolling   Walk 50 feet activity   Assist Walk 50 feet with 2 turns activity did not occur: Safety/medical concerns  Assist level: Contact Guard/Touching assist Assistive device: Walker-rolling    Walk 150 feet activity   Assist Walk 150 feet activity did not occur: Safety/medical concerns         Walk 10 feet on uneven surface  activity   Assist Walk 10 feet on uneven surfaces activity did not occur: Safety/medical concerns         Wheelchair     Assist Is the patient using a wheelchair?: Yes Type of Wheelchair: Manual Wheelchair activity did not occur:  (Sternal precautions)  Wheelchair assist level: Dependent - Patient 0% Max wheelchair distance: 150'    Wheelchair 50 feet with 2 turns activity    Assist    Wheelchair 50 feet with 2 turns activity did not occur: Safety/medical concerns       Wheelchair 150 feet activity     Assist  Wheelchair 150 feet activity did not occur: Safety/medical concerns       Blood pressure (!) 109/51, pulse (!) 48, temperature (!) 97.4 F (36.3 C), resp. rate 18, height 5\' 8"  (1.727 m), weight 50.1 kg, SpO2 100%.  Medical Problem List and Plan: 1. 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  Sternal precautions             -patient may shower -ELOS/Goals: 14-18 days supervision to mod I-- Placement/TBD  - 1/23: Per  patient/family request, pursuing SNF placement due to inability of caregivers to provide 24/7 support with ongoing min assist ADLs and cognitive deficits requiring assistance   -Stable to continue CIR 1/14: Needs to be independent at discharge per SW; son has moved out of state and can maybe provide 1-2 weeks on discharge. Patient's son's GF has provided some support in the hospital. Carryover very poor; Max-total assist with sternal precautions, Min A if able to push up. Difficulty with LBD and toiletting tasks - will forget what he is doing with toileting tasks. Per PT has been needing lots of help at home too. Per SW presenting like dementia cog-wise. Doing well with Dys 3. Will need assistance at discharge; SW to discuss with family.  1-16: Discussed patient dispo requirements  with son [name], emphasizing need for full supervision at discharge and anticipating will be greater than a 1 to 2 months needed.  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  After discussion with social work, family still unsure who will come in for training, will likely provide supervision for 2 weeks at home prior to deciding whether patient needs more oversight. 1/21: At bedtime incontinence per nursing. Per SW family not agreeing to care plan involving [name]/her mother, which was offered; this is consistent with patient's wishes as well. Unsure of dispo plan at this time as family is not offering alternatives.  1-22: Family discussed appealing discharge with social work due to feeling patient is not medically optimized.  Per team, primary barriers remain cognitive, not expected to improve further with additional time. 1/28: Per SLP, patient panicked with whole medications even though he complains of crushed; he prefers them to be "dust". Markham Jordan is coming next week. Upgraded to regular diet. Ongoing evening  behaviors.   2.  Antithrombotics: -DVT/anticoagulation:  Pharmaceutical: Lovenox 40mg  daily.  Venous Doppler studies negative -antiplatelet therapy: Aspirin 81 mg daily.  3. Pain Management: Oxycodone as needed--no complaints  4. Mood/Behavior/Sleep: Provide emotional support             - antipsychotic agents: Seroquel 25 mg daily at 1000 and 50 mg nightly   - Continue current regimen, can add melatonin 5 mg as needed  -1-13: Will start to wean off antipsychotics; DC AM Seroquel for tomorrow 1-15: Patient tolerated well, change nightly Seroquel to as needed--has resulted in somewhat interrupted sleep but so far has been tolerable.  Noel Christmas states he gets anxious at night sometimes regarding dispo issues. 1/17: No PRNs used.  Continue melatonin, will add nightly as needed trazodone 50 mg retime at bedtime medications to 8 PM given this is his normal bedtime--improved 1-20: Family reporting compulsive calls, anxieties in the early evenings between therapies and sleep.  Patient is sleeping well, no hallucinations or signs of delirium at this time.  Likely early sundowning, adding Atarax for anxiety and informed nursing to be aware of these behaviors. 04-03-21: No reports by nursing overnight, no use of as needed Atarax 1-24: Increasing anxiety and confused behavior since family elected going to SNF; encouraged use of PRN Atarax, start BuSpar 5 mg twice daily 1-28: Adding mirtazapine 7.5 mg nightly for sleep, appetite stimulation, and ongoing anxiety reported by staff. 1/29: DC buspar due to no effect, addition of mirtazepine.  1/30: DC mirtazepine due to persistent orthostasis  1/31: Endorses he slept well overnight, continue current management -04/14/23 endorses poor sleep but isn't really sure; monitor for now  5. Neuropsych/cognition: This patient is capable of making decisions on his own behalf.  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  Weaning sedating medications as above.  Will start Aricept 5 mg nightly-theoretical risk of arrhythmia, however on literature review cardiac events matched placebo on meta-analysis. 1-15: Tolerating Aricept well--and noticing some mild cognitive improvements 1/21: Per cognitive evaluation, patient can make reasonable decisions for himself although underlying dementia requires some reorientation and cueing  6. Skin/Wound Care: Routine skin checks. -His right lower extremity harvest site completing course of Ancef for cellulitis --1/8: transitioned to Augmentin 1-8 to complete 10-day course--appearance slightly more edematous/erythematous 1-14, will get WBC in a.m. and monitor closely, still  staying within margins of prior marking so may just be taking a long time to resolve  1-13: DC PICC line--site looks clean. -1-17: Right clavicle staples removed.  Remove abdominal sutures today. - 1/21: RLE swelling/infection resolved 1-22: Dry skin overlying prior infection site; add Eucerin daily--appearance improving  7. Fluids/Electrolytes/Nutrition/hyponatremia: Routine in and outs with follow-up chemistries, continue supplements.  -1-8: Low albumin, protein; continue current supplemental tube feeds as below.  Encourage protein and p.o. intakes.  Very mild hyponatremia, will recheck on Friday. 1-10: Hyponatremia 129, slight downtrend from 130.  Repeat BMP, serum osmol, urine studies in a.m. to confirm etiology--will be somewhat complicated by use of torsemide.  Placed on 1200 cc fluid restriction; patient is remaining well below this. -03/24/23 Na up to 132, SOsm WNL, urine studies not done yet -03/25/23 Urine Osm still not done... scheduled weekly labs starting Monday - 1/13: Na improved/stable - repeat Wednesday--add magnesium to today's labs--2.1, stable 1-15: NA stable 131; add calcium supplementation, PRN tums, and increase protonic to 40 mg BID  for at bedtime nausea 1-17: Added as needed Zofran 4 mg every 8 hours, and miscellaneous nursing order to try and divide out medications and not give all at once crushed in applesauce due to intolerance of foul taste 1-20: Only 1 use of Zofran over the weekend, no recorded emesis --still having nausea around crushed pills only -04/08/23 BMP done yesterday afternoon, Na 132 stable, BUN 47 and Cr 1.44 which is up-- encourage PO fluids today, recheck tomorrow; will hold off on starting fluids just yet.  1-27: Sodium remains downtrending 129, BUN and creatinine slightly improved.  P.o. intakes appear low Is.  Start 500 cc IV fluids today, repeat in AM. 1-28: Sodium improved to 131 with IV fluids and DC torsemide.  Continue regular monitoring of sodium 1-30: Na back down to 128; DC BP medications, DC mirtazepine, start IVF NS 50 cc/hr for 1 day. CMP in AM. Now off confounding meds will plan for urine studies in AM to eval for SIADH 1-31: NA back to 131.  Unfortunately, still orthostatic, continue IV fluids.  Serum osmolality within normal limits, awaiting urine studies for full picture.  8.  Acute blood loss anemia.  Follow-up CBC -stable   - HgB improving/stable  9.  COVID-positive.  Completed Paxlovid  -1-17: DC schedule guaifenesin.  10.  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  Seen by vascular surgery Dr Carolynn Sayers and plan outpatient follow-up 1 month  11.  History of VT peri-CABG.  Amiodarone 200 mg BID>>daily, Lanoxin 0.125 mg daily  -Remains with low/stable heart rate -1-14: Patient reporting irregular palpitations during therapies.  None documented, made PT aware so they can document any events during their therapies today.--did not occur per therapies 1/14 1-29: Adjusting blood pressure medications as below; may need to adjust amiodarone to 100 mg daily if no improvement with adjustment of diuretics--ongoing 1-31: Reached out to cardiology regarding  ongoing orthostasis with multiple recent medication adjustments.  They have recommended discontinuation of Farxiga, as this can cause orthostasis, also possible contributed to hyperkalemia.  Will do this today, continue other medications at current dose  12.  Hyperlipidemia.  Lipitor 80mg  daily 13.  Diastolic congestive heart failure.  Torsemide 20 mg daily, Cozaar 25 mg daily, Aldactone 25 mg daily. Dapagliflozin 10mg  daily. Monitor for any signs of fluid overload.  Daily weights.  1-24: Weights with ongoing downtrend, per record patient has adequate p.o. intakes and compliant with  nutrition shakes between meals, now with reports of orthostasis and blood pressure 80 over 50s this afternoon.  Reduce torsemide to 10 mg daily, and encourage p.o. fluids. - Orthostatic vitals in AM ordered -1/25-26/25 wt fairly stable; monitor 1/27: Increasingly  low blood pressure, will DC torsemide today and encourage p.o. fluids, initiate 500 cc IV fluids at 50 cc/h today.  BMP in AM. 1-28: Weights have stabilized today.  Continue monitoring for 1 to 2 days, then consider resumption of torsemide at lower dose. 1/29: Worsening orthostasis despite holding diuretic torsemide, no peripheral signs of fluid overload, although weights have normalized at this point.  DC spironolactone 25 mg daily and Cozaar 25 mg daily, monitor closely.  Encourage p.o. fluids. 1/30: Continued orthostasis, weights stable and euvolemic; add IVF as above 1-31: Blood pressure mildly improved with IV fluid, but continues to be orthostatic.  Continue for today, continue to emphasize to patient encourage of p.o. fluid intake as well.  DC Farxiga per cardiology as above. Filed Weights   04/12/23 0504 04/13/23 0500 04/14/23 0908  Weight: 49.1 kg 48.8 kg 50.1 kg    14.  Sacral wound.  Wound care nurse follow-up.  Covered with foam dressing -1-16: Caught himself on the back during a toileting transfer today per nursing, resulting in small wound,  covered in barrier dressing.  15.  Dysphagia/severe protein calorie malnutrition.  Dysphagia #2 thin liquids.   -Cortrak on intake; Continue supplemental tube feeds as above, encourage p.o. intakes, protein. -1-10: Eating 60 to 90% of meals per documentation.  Discontinue core track, tube feeds.--Continuing to eat well 1/28: P.o. intakes adequate per record, but cachectic appearance and continuing to lose weight.  Somewhat improved with adjustment of diuretics as above.  Will start mirtazapine for appetite stimulation. 1-29: Dietary evaluated, continue current management with BID ensure shakes 1/30: Continues poor PO intakes despite considerable encouragement; unfortunately not tolerating mirtazepine 1-31: Family bringing in food that he likes today.  Encouraged consumption of protein shakes, Magic cups between meals.   Has lost ~20 lbs since admission 1/8; some due to diuresis, eating 25-80% meals (avg 60%) but continues to lose weight despite decent intakes - if no improvement with management as above , may need workup for more insidious causes.   16. Stool regimen: colace 200mg  daily  -1-17: DC liquid Colace, having regular bowel movements  -03/31/23 no BM in 3 days, will restart colace at 100mg  daily for now.  -04/01/23 pt states BM yesterday but none documented since 1/15, nurse documentation states LBM 1/16, unclear which is correct; asked nursing to please document if he's had BM today, if none today then would advance bowel regimen --1-20: Increase Colace to 100 mg twice daily, add Senokot S1 tab at night  -1/24 LBM  -04/07/23 no good BM since 1/21, just a smear on 1/24; hard palpable stool in lower abdomen, pt feels it in rectum; will do sorbitol then mineral oil enema. Hopefully this improves it.  -04/08/23 multiple BMs yesterday with enema/meds, feels better; cont regimen   -04/14/23 LBM yesterday, cont regimen  17. Thrombocytosis.  resolved   LOS: 25 days A FACE TO FACE EVALUATION WAS  PERFORMED  [address] 04/14/2023, 11:49 AM

## 2023-04-15 NOTE — Progress Notes (Signed)
Patient refused 2000 and 2200 medication stating that melatonin keeps him awake and feels like his bowels are fine and he does need the Pepcid. He feels like they over medicate you.

## 2023-04-15 NOTE — Progress Notes (Signed)
PROGRESS NOTE   Subjective/Complaints:  Pt doing ok today, but says he "maybe slept ok"-- very fixated on getting his iphone battery changed, wants [place] to help him get a new battery.  Denies pain today. Unsure of LBM, looks like he had one 1/31 but says it was small.  Urinating ok.  Denies any other complaints or concerns today.   ROS: Denies fevers, chills,  abd pain, vomiting, diarrhea, SOB, cough, chest pain, new weakness or paraesthesias.   + Nausea w/ pills crushed in food-- improved + anxiety--ongoing + Orthostasis - ongoing  Objective:   No results found.   No results for input(s): "WBC", "HGB", "HCT", "PLT" in the last 72 hours.     Recent Labs    04/13/23 0631  NA 131*  K 5.6*  CL 101  CO2 19*  GLUCOSE 68*  BUN 27*  CREATININE 0.91  CALCIUM 9.1      Intake/Output Summary (Last 24 hours) at 04/15/2023 1227 Last data filed at 04/14/2023 1748 Gross per 24 hour  Intake 1309.9 ml  Output 100 ml  Net 1209.9 ml      Pressure Injury 03/15/23 Coccyx Mid;Lower Stage 2 -  Partial thickness loss of dermis presenting as a shallow open injury with a red, pink wound bed without slough. (Active)  03/15/23 0800  Location: Coccyx  Location Orientation: Mid;Lower  Staging: Stage 2 -  Partial thickness loss of dermis presenting as a shallow open injury with a red, pink wound bed without slough.  Wound Description (Comments):   Present on Admission: Yes    Physical Exam: Vital Signs Blood pressure 138/82, pulse 83, temperature 98.1 F (36.7 C), temperature source Oral, resp. rate 16, height 5\' 8"  (1.727 m), weight 50.2 kg, SpO2 97%.   Constitution: Appropriate appearance for age. No apparent distress. Laying in bed.  Cachectic appearance.  HENT: MMM Resp:   clear to auscultation bilaterally.  On room air, no respiratory distress. Cardio: normal rate, reg rhythm.   Chronic bilateral lower extremity skin  changes.No edema.  Abdomen: soft, Nondistended. Nontender,  +BS throughout, normoactive  Psych: Appropriate mood and affect.  Mildly anxious. Hyperfixated on getting his phone fixed  PRIOR EXAMS: Skin:   + Right inner thigh/calf with scabbing from vein harvest site - healed + Peripheral IV, C/D/I MSK: Severe lumbar levoscoliosis.   Neurologic Exam:  Awake, alert, and oriented to self, place, and time + Moderate memory and higher cognitive deficits  - ongoing.  Did remember some remote conversations with provider today.  Follows all simple commands. Cranial nerves II through XII intact.  Sensation intact.  No fine motor deficits.  No ataxia. Insight: Fair insight into current condition Strength: Antigravity against resistance all 4 extremities grossly 5- out of 5 throughout   - unchanged 1/31  Assessment/Plan: 1. Functional deficits which require 3+ hours per day of interdisciplinary therapy in a comprehensive inpatient rehab setting. Physiatrist is providing close team supervision and 24 hour management of active medical problems listed below. Physiatrist and rehab team continue to assess barriers to discharge/monitor patient progress toward functional and medical goals  Care Tool:  Bathing    Body parts bathed by patient: Right  arm, Left arm, Chest, Abdomen, Front perineal area, Buttocks, Right upper leg, Left upper leg, Face, Right lower leg, Left lower leg   Body parts bathed by helper: Right lower leg, Left lower leg     Bathing assist Assist Level: Supervision/Verbal cueing     Upper Body Dressing/Undressing Upper body dressing   What is the patient wearing?: Pull over shirt    Upper body assist Assist Level: Supervision/Verbal cueing    Lower Body Dressing/Undressing Lower body dressing      What is the patient wearing?: Pants, Underwear/pull up     Lower body assist Assist for lower body dressing: Minimal Assistance - Patient > 75%     Toileting Toileting     Toileting assist Assist for toileting: Minimal Assistance - Patient > 75%     Transfers Chair/bed transfer  Transfers assist     Chair/bed transfer assist level: Contact Guard/Touching assist     Locomotion Ambulation   Ambulation assist      Assist level: Contact Guard/Touching assist Assistive device: Walker-rolling Max distance: 150'   Walk 10 feet activity   Assist  Walk 10 feet activity did not occur: Safety/medical concerns  Assist level: Contact Guard/Touching assist Assistive device: Walker-rolling   Walk 50 feet activity   Assist Walk 50 feet with 2 turns activity did not occur: Safety/medical concerns  Assist level: Contact Guard/Touching assist Assistive device: Walker-rolling    Walk 150 feet activity   Assist Walk 150 feet activity did not occur: Safety/medical concerns         Walk 10 feet on uneven surface  activity   Assist Walk 10 feet on uneven surfaces activity did not occur: Safety/medical concerns         Wheelchair     Assist Is the patient using a wheelchair?: Yes Type of Wheelchair: Manual Wheelchair activity did not occur:  (Sternal precautions)  Wheelchair assist level: Dependent - Patient 0% Max wheelchair distance: 150'    Wheelchair 50 feet with 2 turns activity    Assist    Wheelchair 50 feet with 2 turns activity did not occur: Safety/medical concerns       Wheelchair 150 feet activity     Assist  Wheelchair 150 feet activity did not occur: Safety/medical concerns       Blood pressure 138/82, pulse 83, temperature 98.1 F (36.7 C), temperature source Oral, resp. rate 16, height 5\' 8"  (1.727 m), weight 50.2 kg, SpO2 97%.  Medical Problem List and Plan: 1. 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  Sternal precautions             -patient may shower -ELOS/Goals: 14-18 days supervision  to mod I-- Placement/TBD  - 1/23: Per patient/family request, pursuing SNF placement due to inability of caregivers to provide 24/7 support with ongoing min assist ADLs and cognitive deficits requiring assistance   -Stable to continue CIR 1/14: Needs to be independent at discharge per SW; son has moved out of state and can maybe provide 1-2 weeks on discharge. Patient's son's GF has provided some support in the hospital. Carryover very poor; Max-total assist with sternal precautions, Min A if able to push up. Difficulty with LBD and toiletting tasks - will forget what he is doing with toileting tasks. Per PT has been needing lots of help at home too. Per SW presenting like dementia cog-wise. Doing well with Dys 3. Will need assistance at discharge; SW to discuss with  family.  1-16: Discussed patient dispo requirements with son [name], emphasizing need for full supervision at discharge and anticipating will be greater than a 1 to 2 months needed.  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  After discussion with social work, family still unsure who will come in for training, will likely provide supervision for 2 weeks at home prior to deciding whether patient needs more oversight. 1/21: At bedtime incontinence per nursing. Per SW family not agreeing to care plan involving [name]/her mother, which was offered; this is consistent with patient's wishes as well. Unsure of dispo plan at this time as family is not offering alternatives.  1-22: Family discussed appealing discharge with social work due to feeling patient is not medically optimized.  Per team, primary barriers remain cognitive, not expected to improve further with additional time. 1/28: Per SLP, patient panicked with whole medications even though he complains of crushed; he prefers them to be "dust". Markham Jordan is coming next week. Upgraded  to regular diet. Ongoing evening behaviors.   2.  Antithrombotics: -DVT/anticoagulation:  Pharmaceutical: Lovenox 40mg  daily.  Venous Doppler studies negative -antiplatelet therapy: Aspirin 81 mg daily.  3. Pain Management: Oxycodone as needed--no complaints  4. Mood/Behavior/Sleep: Provide emotional support             - antipsychotic agents: Seroquel 25 mg daily at 1000 and 50 mg nightly   - Continue current regimen, can add melatonin 5 mg as needed  -1-13: Will start to wean off antipsychotics; DC AM Seroquel for tomorrow 1-15: Patient tolerated well, change nightly Seroquel to as needed--has resulted in somewhat interrupted sleep but so far has been tolerable.  Noel Christmas states he gets anxious at night sometimes regarding dispo issues. 1/17: No PRNs used.  Continue melatonin, will add nightly as needed trazodone 50 mg retime at bedtime medications to 8 PM given this is his normal bedtime--improved 1-20: Family reporting compulsive calls, anxieties in the early evenings between therapies and sleep.  Patient is sleeping well, no hallucinations or signs of delirium at this time.  Likely early sundowning, adding Atarax for anxiety and informed nursing to be aware of these behaviors. 04-03-21: No reports by nursing overnight, no use of as needed Atarax 1-24: Increasing anxiety and confused behavior since family elected going to SNF; encouraged use of PRN Atarax, start BuSpar 5 mg twice daily 1-28: Adding mirtazapine 7.5 mg nightly for sleep, appetite stimulation, and ongoing anxiety reported by staff. 1/29: DC buspar due to no effect, addition of mirtazepine.  1/30: DC mirtazepine due to persistent orthostasis  1/31: Endorses he slept well overnight, continue current management -04/14/23 endorses poor sleep but isn't really sure; monitor for now  5. Neuropsych/cognition: This patient is capable of making decisions on his own behalf.  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  Weaning sedating medications as above.  Will start Aricept 5 mg nightly-theoretical risk of arrhythmia, however on literature review cardiac events matched placebo on meta-analysis. 1-15: Tolerating Aricept well--and noticing some mild cognitive improvements 1/21: Per cognitive evaluation, patient can make reasonable decisions for himself although underlying dementia requires some reorientation and cueing  6. Skin/Wound Care: Routine skin checks. -His right lower extremity harvest site completing course of Ancef for cellulitis --1/8: transitioned to Augmentin 1-8 to complete 10-day course--appearance slightly more edematous/erythematous 1-14, will get  WBC in a.m. and monitor closely, still staying within margins of prior marking so may just be taking a long time to resolve  1-13: DC PICC line--site looks clean. -1-17: Right clavicle staples removed.  Remove abdominal sutures today. - 1/21: RLE swelling/infection resolved 1-22: Dry skin overlying prior infection site; add Eucerin daily--appearance improving  7. Fluids/Electrolytes/Nutrition/hyponatremia: Routine in and outs with follow-up chemistries, continue supplements.  -1-8: Low albumin, protein; continue current supplemental tube feeds as below.  Encourage protein and p.o. intakes.  Very mild hyponatremia, will recheck on Friday. 1-10: Hyponatremia 129, slight downtrend from 130.  Repeat BMP, serum osmol, urine studies in a.m. to confirm etiology--will be somewhat complicated by use of torsemide.  Placed on 1200 cc fluid restriction; patient is remaining well below this. -03/24/23 Na up to 132, SOsm WNL, urine studies not done yet -03/25/23 Urine Osm still not done... scheduled weekly labs starting Monday - 1/13: Na improved/stable - repeat Wednesday--add magnesium to today's labs--2.1, stable 1-15: NA stable 131; add calcium supplementation, PRN tums, and  increase protonic to 40 mg BID for at bedtime nausea 1-17: Added as needed Zofran 4 mg every 8 hours, and miscellaneous nursing order to try and divide out medications and not give all at once crushed in applesauce due to intolerance of foul taste 1-20: Only 1 use of Zofran over the weekend, no recorded emesis --still having nausea around crushed pills only -04/08/23 BMP done yesterday afternoon, Na 132 stable, BUN 47 and Cr 1.44 which is up-- encourage PO fluids today, recheck tomorrow; will hold off on starting fluids just yet.  1-27: Sodium remains downtrending 129, BUN and creatinine slightly improved.  P.o. intakes appear low Is.  Start 500 cc IV fluids today, repeat in AM. 1-28: Sodium improved to 131 with IV fluids and DC torsemide.  Continue regular monitoring of sodium 1-30: Na back down to 128; DC BP medications, DC mirtazepine, start IVF NS 50 cc/hr for 1 day. CMP in AM. Now off confounding meds will plan for urine studies in AM to eval for SIADH 1-31: NA back to 131.  Unfortunately, still orthostatic, continue IV fluids.  Serum osmolality within normal limits, awaiting urine studies for full picture.-- UOsm 666 WNL, UNa 77  8.  Acute blood loss anemia.  Follow-up CBC -stable   - HgB improving/stable  9.  COVID-positive.  Completed Paxlovid  -1-17: DC schedule guaifenesin.  10.  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  Seen by vascular surgery Dr Carolynn Sayers and plan outpatient follow-up 1 month  11.  History of VT peri-CABG.  Amiodarone 200 mg BID>>daily, Lanoxin 0.125 mg daily  -Remains with low/stable heart rate -1-14: Patient reporting irregular palpitations during therapies.  None documented, made PT aware so they can document any events during their therapies today.--did not occur per therapies 1/14 1-29: Adjusting blood pressure medications as below; may need to adjust amiodarone to 100 mg daily if no improvement with adjustment of  diuretics--ongoing 1-31: Reached out to cardiology regarding ongoing orthostasis with multiple recent medication adjustments.  They have recommended discontinuation of Farxiga, as this can cause orthostasis, also possible contributed to hyperkalemia.  Will do this today, continue other medications at current dose  12.  Hyperlipidemia.  Lipitor 80mg  daily 13.  Diastolic congestive heart failure.  Torsemide 20 mg daily, Cozaar 25 mg daily, Aldactone 25 mg daily. Dapagliflozin 10mg  daily. Monitor for any signs of fluid overload.  Daily weights.  1-24: Weights with  ongoing downtrend, per record patient has adequate p.o. intakes and compliant with nutrition shakes between meals, now with reports of orthostasis and blood pressure 80 over 50s this afternoon.  Reduce torsemide to 10 mg daily, and encourage p.o. fluids. - Orthostatic vitals in AM ordered -1/25-26/25 wt fairly stable; monitor 1/27: Increasingly  low blood pressure, will DC torsemide today and encourage p.o. fluids, initiate 500 cc IV fluids at 50 cc/h today.  BMP in AM. 1-28: Weights have stabilized today.  Continue monitoring for 1 to 2 days, then consider resumption of torsemide at lower dose. 1/29: Worsening orthostasis despite holding diuretic torsemide, no peripheral signs of fluid overload, although weights have normalized at this point.  DC spironolactone 25 mg daily and Cozaar 25 mg daily, monitor closely.  Encourage p.o. fluids. 1/30: Continued orthostasis, weights stable and euvolemic; add IVF as above 1-31: Blood pressure mildly improved with IV fluid, but continues to be orthostatic.  Continue for today, continue to emphasize to patient encourage of p.o. fluid intake as well.  DC Farxiga per cardiology as above. Filed Weights   04/13/23 0500 04/14/23 0908 04/15/23 0500  Weight: 48.8 kg 50.1 kg 50.2 kg    14.  Sacral wound.  Wound care nurse follow-up.  Covered with foam dressing -1-16: Caught himself on the back during a  toileting transfer today per nursing, resulting in small wound, covered in barrier dressing.  15.  Dysphagia/severe protein calorie malnutrition.  Dysphagia #2 thin liquids.   -Cortrak on intake; Continue supplemental tube feeds as above, encourage p.o. intakes, protein. -1-10: Eating 60 to 90% of meals per documentation.  Discontinue core track, tube feeds.--Continuing to eat well 1/28: P.o. intakes adequate per record, but cachectic appearance and continuing to lose weight.  Somewhat improved with adjustment of diuretics as above.  Will start mirtazapine for appetite stimulation. 1-29: Dietary evaluated, continue current management with BID ensure shakes 1/30: Continues poor PO intakes despite considerable encouragement; unfortunately not tolerating mirtazepine 1-31: Family bringing in food that he likes today.  Encouraged consumption of protein shakes, Magic cups between meals.   Has lost ~20 lbs since admission 1/8; some due to diuresis, eating 25-80% meals (avg 60%) but continues to lose weight despite decent intakes - if no improvement with management as above , may need workup for more insidious causes.   16. Stool regimen: colace 200mg  daily  -1-17: DC liquid Colace, having regular bowel movements  -03/31/23 no BM in 3 days, will restart colace at 100mg  daily for now.  -04/01/23 pt states BM yesterday but none documented since 1/15, nurse documentation states LBM 1/16, unclear which is correct; asked nursing to please document if he's had BM today, if none today then would advance bowel regimen --1-20: Increase Colace to 100 mg twice daily, add Senokot S1 tab at night  -1/24 LBM  -04/07/23 no good BM since 1/21, just a smear on 1/24; hard palpable stool in lower abdomen, pt feels it in rectum; will do sorbitol then mineral oil enema. Hopefully this improves it.  -04/08/23 multiple BMs yesterday with enema/meds, feels better; cont regimen  -04/15/23 LBM 2d ago, cont regimen but if no BM by  tomorrow, may want to add miralax or increase senokot-- likely not eating enough to have daily BMs either...  17. Thrombocytosis.  resolved   LOS: 26 days A FACE TO FACE EVALUATION WAS PERFORMED  [address] 04/15/2023, 12:27 PM

## 2023-04-16 DIAGNOSIS — R419 Unspecified symptoms and signs involving cognitive functions and awareness: Secondary | ICD-10-CM

## 2023-04-16 DIAGNOSIS — Z0489 Encounter for examination and observation for other specified reasons: Secondary | ICD-10-CM

## 2023-04-16 LAB — CBC
HCT: 39.8 % (ref 39.0–52.0)
Hemoglobin: 12.9 g/dL — ABNORMAL LOW (ref 13.0–17.0)
MCH: 28.5 pg (ref 26.0–34.0)
MCHC: 32.4 g/dL (ref 30.0–36.0)
MCV: 87.9 fL (ref 80.0–100.0)
Platelets: 198 10*3/uL (ref 150–400)
RBC: 4.53 MIL/uL (ref 4.22–5.81)
RDW: 17.7 % — ABNORMAL HIGH (ref 11.5–15.5)
WBC: 4.4 10*3/uL (ref 4.0–10.5)
nRBC: 0 % (ref 0.0–0.2)

## 2023-04-16 LAB — BASIC METABOLIC PANEL
Anion gap: 7 (ref 5–15)
BUN: 12 mg/dL (ref 8–23)
CO2: 23 mmol/L (ref 22–32)
Calcium: 8.7 mg/dL — ABNORMAL LOW (ref 8.9–10.3)
Chloride: 103 mmol/L (ref 98–111)
Creatinine, Ser: 0.8 mg/dL (ref 0.61–1.24)
GFR, Estimated: >60 mL/min (ref >60)
Glucose, Bld: 76 mg/dL (ref 70–99)
Potassium: 4.2 mmol/L (ref 3.5–5.1)
Sodium: 133 mmol/L — ABNORMAL LOW (ref 135–145)

## 2023-04-16 MED ORDER — MELATONIN 5 MG PO TABS
5.0000 mg | ORAL_TABLET | Freq: Every evening | ORAL | Status: DC | PRN
  Administered 2023-04-18 – 2023-04-24 (×3): 5 mg via ORAL
  Filled 2023-04-16 (×3): qty 1

## 2023-04-16 MED ORDER — SENNOSIDES-DOCUSATE SODIUM 8.6-50 MG PO TABS
1.0000 | ORAL_TABLET | Freq: Every evening | ORAL | Status: DC | PRN
  Administered 2023-04-23 – 2023-04-24 (×2): 1 via ORAL
  Filled 2023-04-16 (×2): qty 1

## 2023-04-16 MED ORDER — DRONABINOL 2.5 MG PO CAPS
2.5000 mg | ORAL_CAPSULE | Freq: Two times a day (BID) | ORAL | Status: DC
  Administered 2023-04-16 – 2023-04-18 (×6): 2.5 mg via ORAL
  Filled 2023-04-16 (×7): qty 1

## 2023-04-16 MED ORDER — ENSURE ENLIVE PO LIQD
237.0000 mL | Freq: Three times a day (TID) | ORAL | Status: DC
  Administered 2023-04-16 – 2023-04-17 (×3): 237 mL via ORAL

## 2023-04-16 NOTE — Consult Note (Cosign Needed Addendum)
[facility] Psychiatric Consult Initial  Patient Name: .Walter Vargas  MRN: [id number]  DOB: 13-Feb-1946  Consult Order details:  Orders (From admission, onward)     Start     Ordered   04/16/23 0914  IP CONSULT TO PSYCHIATRY       Ordering Provider: Angelina Sheriff, DO  Provider:  (Not yet assigned)  Question Answer Comment  Location [facility]   Reason for Consult? capacity evaluation with likely new  dementia      04/16/23 0914            Mode of Visit: In person   Psychiatry Consult Evaluation  Service Date: April 16, 2023 LOS:  LOS: 27 days  Chief Complaint capacity evaluation with likely new onset dementia  Primary Psychiatric Diagnoses  Suspected neurocognitive disorder  Assessment  Walter Vargas is a 78 y.o. male admitted: Medically for 03/20/2023  4:08 PM for debility after CV surgery. He carries the psychiatric diagnoses of none and has a past medical history of HTN, HFrEF s/p impells VAD and CABG.   His current presentation of inability to appreciate his current situation or explain the reasoning for his decision vs alternative treatment options and lack of understanding about his chosen treatment option (leaving AMA) is most consistent with lacking capacity. While patient expresses a consistent choice re desire to leave AMA, he is unable to state the reasoning for this choice and has limited insight into his ability to care for himself at home and the consequences of going back home. At this time, regarding the specific decision of whether or not the patient can leave AMA at this time, patient does not have capacity for this decision.   In an evaluation of capacity, each of the following criteria must be met based on medical necessity in order for a patient to have capacity to make the decision in question. Of note, the capacity evaluation assesses only for the specified decision documented above and is not a determination of the patient's overall  competency, which can only be adjudicated.  Criterion 1: The patient demonstrates a clear and consistent voluntary choice with regard to treatment options.  -Patient reports that he wants to leave and live at home by himself.  Criterion 2: The patient adequately understands the disease they have, the treatment proposed, the risks of treatment, and the risks of other treatment (including no treatment). -Patient is unable to state the consequences of leaving AMA, states that nothing would happen and he would take care of himself. He states that he was living at home by himself for a long time and could take care of himself. He states that the medical team thinks he is also ready to leave.   Criterion 3: The patient acknowledges that the details of Criterion 2 apply to them specifically and the likely consequences of treatment options proposed. -As above. Patient is unable to state consequences of what could happen if he were to leave AMA and live alone at home, stating that he knows he can manage. He is unable to list consequences such as possibility of falling and requiring another rehab stay.   Criterion 4: The patient demonstrates adequate reasoning/rationality within the context of their decision and can provide justification for their choice. Patient states that he knows he can manage despite the fact that he is receiving assists for toileting and showering while in the hospital.   In this case, the patient does not have capacity to decide to  leave AMA.   Please see plan below for detailed recommendations.   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  ## Disposition:-- Per primary  ## Behavioral / Environmental: - No specific recommendations at this time.    ## Safety and Observation Level:  - Based on my clinical evaluation, I estimate the patient to be at low risk of self harm in the current setting. - At this time, we recommend  routine. This decision is based on my review of the chart including patient's history and current presentation, interview of the patient, mental status examination, and consideration of suicide risk including evaluating suicidal ideation, plan, intent, suicidal or self-harm behaviors, risk factors, and protective factors. This judgment is based on our ability to directly address suicide risk, implement suicide prevention strategies, and develop a safety plan while the patient is in the clinical setting. Please contact our team if there is a concern that risk level has changed.  CSSR Risk Category:C-SSRS RISK CATEGORY: No Risk  Suicide Risk Assessment: Patient has following modifiable risk factors for suicide: social isolation, which we are addressing by recommending that patient not live alone at home after dc. Patient has following non-modifiable or demographic risk factors for suicide: male gender Patient has the following protective factors against suicide: Supportive family and no history of suicide attempts  Thank you for this consult request. Recommendations have been communicated to the primary team.  We will sign off at this time.   Karie Fetch, MD       History of Present Illness  Relevant Aspects of [facility] Course:  Admitted on 03/20/2023 for rehab after surgery for impells VAD 12/19 and CABG 02/2023 c/b postop bleeding after presenting to ED with SOB, chest tightness, abdominal discomfort in the setting of HFrEF.   Patient Report:  Patient reports he feels like he doesn't need to participate in rehab. Asked about living at home, he reports that he would need no help at home  because he was living on his own for a long time by himself. He reports he can take care of himself. He reports nothing would happen if he lived at home by himself. He reports he could take care of himself. He states he knows he can manage but is unable to state how. He has limited insight into his functional deficits and consequences of going back home. He states that he trusts his son's decisions but then states that his son also would agree with his decision which is not reflected in prior notes. Denies SI/HI. Denies hx of SA or past psych hospitalizations.  Review of Systems  Constitutional:  Negative for fever.  Psychiatric/Behavioral:  Negative for depression and suicidal ideas.      Psychiatric and Social History  Psychiatric History:  Denies past psychiatric history  Social History:  Was previously living at home alone   Exam Findings  Physical Exam:  Vital Signs:  Temp:  [97.9 F (36.6 C)-98.6 F (37 C)] 97.9 F (36.6 C) (02/03 1325) Pulse Rate:  [58-65] 64 (02/03 1325) Resp:  [16-18] 18 (02/03 1325) BP: (100-112)/(54-66) 100/58 (02/03 1325) SpO2:  [98 %-100 %] 100 % (  02/03 1325) Weight:  [49.7 kg] 49.7 kg (02/03 0533) Blood pressure (!) 100/58, pulse 64, temperature 97.9 F (36.6 C), resp. rate 18, height 5\' 8"  (1.727 m), weight 49.7 kg, SpO2 100%. Body mass index is 16.66 kg/m.  Physical Exam Constitutional:      Appearance: He is not ill-appearing.  HENT:     Head: Normocephalic and atraumatic.  Pulmonary:     Effort: Pulmonary effort is normal.  Neurological:     General: No focal deficit present.     Mental Status: He is alert.     Mental Status Exam: General Appearance: Casual  Orientation:  Full (Time, Place, and Person)  Memory:  Immediate;   Poor Recent;   Poor  Concentration:  Concentration: Fair  Recall:  Fair  Attention  Fair  Eye Contact:  Good  Speech:  Normal Rate  Language:  Good  Volume:  Normal  Mood: "Fine"  Affect:  Appropriate   Thought Process:  Goal directed to go back home  Thought Content:  Illogical  Suicidal Thoughts:  No  Homicidal Thoughts:  No  Judgement:  Impaired  Insight:  Shallow  Psychomotor Activity:  Normal  Akathisia:  No  Fund of Knowledge:  Fair      Assets:  Manufacturing systems engineer Social Support  Cognition:  Impaired,  Mild  ADL's:  Impaired  AIMS (if indicated):        Other History   These have been pulled in through the EMR, reviewed, and updated if appropriate.  Family History:  The patient's family history includes Alcohol abuse in his brother and father; Diabetes in his mother; Drug abuse in his brother; Heart disease in his brother; Hypertension in his father.  Medical History: Past Medical History:  Diagnosis Date   Essential hypertension    HFrEF (heart failure with reduced ejection fraction) (HCC)    Mitral regurgitation     Surgical History: Past Surgical History:  Procedure Laterality Date   CORONARY ARTERY BYPASS GRAFT N/A 03/01/2023   Procedure: CORONARY ARTERY BYPASS GRAFTING X 5, USING LEFT INTERNAL MAMMARY ARTERY AND ENDOSCOPICALLY HARVESTED RIGHT SAPHENOUS VEIN GRAFT;  Surgeon: [name], MD;  Location: [facility] OR;  Service: Open Heart Surgery;  Laterality: N/A;   EXPLORATION POST OPERATIVE OPEN HEART N/A 03/01/2023   Procedure: EXPLORATION POST OPERATIVE OPEN HEART;  Surgeon: [name], MD;  Location: [facility] OR;  Service: Open Heart Surgery;  Laterality: N/A;   NO PAST SURGERIES     PLACEMENT OF IMPELLA LEFT VENTRICULAR ASSIST DEVICE  03/01/2023   Procedure: PLACEMENT OF IMPELLA LEFT VENTRICULAR ASSIST DEVICE;  Surgeon: [name], MD;  Location: [facility] OR;  Service: Open Heart Surgery;;   REMOVAL OF IMPELLA LEFT VENTRICULAR ASSIST DEVICE N/A 03/12/2023   Procedure: REMOVAL OF IMPELLA LEFT VENTRICULAR ASSIST DEVICE;  Surgeon: [name], MD;  Location: [facility] OR;  Service: Open Heart Surgery;  Laterality: N/A;   RIGHT/LEFT HEART  CATH AND CORONARY ANGIOGRAPHY N/A 02/13/2023   Procedure: RIGHT/LEFT HEART CATH AND CORONARY ANGIOGRAPHY;  Surgeon: Yvonne Kendall, MD;  Location: [facility] INVASIVE CV LAB;  Service: Cardiovascular;  Laterality: N/A;   TEE WITHOUT CARDIOVERSION N/A 03/01/2023   Procedure: TRANSESOPHAGEAL ECHOCARDIOGRAM (TEE);  Surgeon: [name], MD;  Location: [facility] OR;  Service: Open Heart Surgery;  Laterality: N/A;   TEE WITHOUT CARDIOVERSION N/A 03/12/2023   Procedure: TRANSESOPHAGEAL ECHOCARDIOGRAM (TEE);  Surgeon: [name], MD;  Location: [facility] OR;  Service: Open Heart Surgery;  Laterality: N/A;  Medications:   Current Facility-Administered Medications:    acetaminophen (TYLENOL) 160 MG/5ML solution 650 mg, 650 mg, Oral, Q4H PRN, Angiulli, Mcarthur Rossetti, PA-C   amiodarone (PACERONE) tablet 200 mg, 200 mg, Oral, Daily, Romie Minus, MD, 200 mg at 04/16/23 0818   aspirin chewable tablet 81 mg, 81 mg, Oral, Daily, Angiulli, Mcarthur Rossetti, PA-C, 81 mg at 04/16/23 0818   atorvastatin (LIPITOR) tablet 80 mg, 80 mg, Oral, Daily, Angiulli, Mcarthur Rossetti, PA-C, 80 mg at 04/16/23 1610   calcium carbonate (TUMS - dosed in mg elemental calcium) chewable tablet 200 mg of elemental calcium, 1 tablet, Oral, TID PRN, Angelina Sheriff, DO   digoxin (LANOXIN) tablet 0.125 mg, 0.125 mg, Oral, Daily, Angiulli, Mcarthur Rossetti, PA-C, 0.125 mg at 04/16/23 9604   docusate (COLACE) 50 MG/5ML liquid 100 mg, 100 mg, Oral, BID, Elijah Birk C, DO, 100 mg at 04/16/23 0820   dronabinol (MARINOL) capsule 2.5 mg, 2.5 mg, Oral, BID AC, Engler, Morgan C, DO, 2.5 mg at 04/16/23 1640   enoxaparin (LOVENOX) injection 40 mg, 40 mg, Subcutaneous, Daily, Elijah Birk C, DO, 40 mg at 04/16/23 5409   feeding supplement (ENSURE ENLIVE / ENSURE PLUS) liquid 237 mL, 237 mL, Oral, TID BM, Engler, Morgan C, DO, 237 mL at 04/16/23 1045   Gerhardt's butt cream, , Topical, Daily, Angiulli, Mcarthur Rossetti, PA-C, Given at 04/16/23 8119   hydrocerin  (EUCERIN) cream, , Topical, Daily, Angelina Sheriff, DO, Given at 04/16/23 0820   melatonin tablet 5 mg, 5 mg, Oral, QHS PRN, Elijah Birk C, DO   multivitamin with minerals tablet 1 tablet, 1 tablet, Oral, Daily, Angelina Sheriff, DO, 1 tablet at 04/16/23 0818   ondansetron (ZOFRAN) tablet 4 mg, 4 mg, Oral, Q8H PRN, Elijah Birk C, DO, 4 mg at 04/04/23 1478   Oral care mouth rinse, 15 mL, Mouth Rinse, 4 times per day, Angiulli, Mcarthur Rossetti, PA-C, 15 mL at 04/16/23 1217   Oral care mouth rinse, 15 mL, Mouth Rinse, PRN, Angiulli, Mcarthur Rossetti, PA-C   senna-docusate (Senokot-S) tablet 1 tablet, 1 tablet, Oral, QHS PRN, Elijah Birk C, DO   sodium chloride (OCEAN) 0.65 % nasal spray 1 spray, 1 spray, Each Nare, PRN, Angiulli, Mcarthur Rossetti, PA-C   thiamine (VITAMIN B1) tablet 100 mg, 100 mg, Oral, Daily, Angiulli, Mcarthur Rossetti, PA-C, 100 mg at 04/16/23 2956  Allergies: No Active Allergies  Karie Fetch, MD, PGY-2

## 2023-04-16 NOTE — Plan of Care (Signed)
  Problem: Consults Goal: RH GENERAL PATIENT EDUCATION Description: See Patient Education module for education specifics. Outcome: Progressing   Problem: RH BOWEL ELIMINATION Goal: RH STG MANAGE BOWEL WITH ASSISTANCE Description: STG Manage Bowel with toileting Assistance. Outcome: Progressing Goal: RH STG MANAGE BOWEL W/MEDICATION W/ASSISTANCE Description: STG Manage Bowel with Medication with Mod I Assistance. Outcome: Progressing   Problem: RH BLADDER ELIMINATION Goal: RH STG MANAGE BLADDER WITH ASSISTANCE Description: STG Manage Bladder With toileting Assistance Outcome: Progressing   Problem: RH SKIN INTEGRITY Goal: RH STG SKIN FREE OF INFECTION/BREAKDOWN Description: Manage skin with min assist Outcome: Progressing   Problem: RH SAFETY Goal: RH STG ADHERE TO SAFETY PRECAUTIONS W/ASSISTANCE/DEVICE Description: STG Adhere to Safety Precautions With cues Outcome: Progressing   Problem: RH PAIN MANAGEMENT Goal: RH STG PAIN MANAGED AT OR BELOW PT'S PAIN GOAL Description: Less than 4 with prns Outcome: Progressing   Problem: RH KNOWLEDGE DEFICIT GENERAL Goal: RH STG INCREASE KNOWLEDGE OF SELF CARE AFTER HOSPITALIZATION Description: Patient will be able to take care of his meds ; diet and Manage skin care using educational resources independently  Outcome: Progressing

## 2023-04-16 NOTE — Progress Notes (Signed)
Patient ID: Walter Vargas, male   DOB: 1946-01-07, 78 y.o.   MRN: [id number]  Per attending, pt has a desire to leave hospital and feels we are obstructing him from leaving. A psych consult is being placed to determine capacity. SW will follow-up with pt son [name]) to discuss.  [id number] left long detailed message with pt son [name] discussing above barriers, and continued challenges with obtaining a SNF bed without LTC Medicaid and still no true discharge plan. SW requested return phone call to discuss above, and discharge plan.   [name], MSW, LCSW Office: [phone] Cell: [phone] Fax: [phone]

## 2023-04-16 NOTE — Progress Notes (Signed)
Occupational Therapy Session Note  Patient Details  Name: Walter Vargas MRN: [id number] Date of Birth: 1945/10/29  Session 1 Today's Date: 04/16/2023 OT Individual Time: 1000-1045 OT Individual Time Calculation (min): 45 min    Session 2 Today's Date: 04/16/2023 OT Individual Time: 1330-1415 OT Individual Time Calculation (min): 45 min    Short Term Goals: Week 4:  OT Short Term Goal 1 (Week 4): Pt will complete toileting tasks with CGA OT Short Term Goal 2 (Week 4): Pt will complete LB dressing with CGA OT Short Term Goal 3 (Week 4): Pt will properly place UE during sit <> stands with external cueing  Skilled Therapeutic Interventions/Progress Updates:    Session 1 Pt received supine with no c/o pain, agreeable to OT session. Family friend [name] present and assisting pt with paying rent via check. Pt came to EOB with encouragement and was able to sign check with (S). He demonstrated slow processing and remains perseverative on his iphone battery being broken. He was more argumentative this session and reports "I am going to die in this place", wanting to go home. Provided edu/insight on CLOF and cognitive deficits making this unsafe. BP assessed EOB: 93/54 and then again in standing, 94/54. He completed functional mobility into the bathroom with the RW, CGA. He required mod cueing for RW management and  sequencing when approaching BSC over toilet with his feet getting caught in the RW- very high fall risk. He required min A for clothing management. He changed shirt with (S) and pants with min A. He returned to the w/c and BP was assessed again- 134/72. He completed oral care standing at the sink with close (S), with BUE support. Pt was left sitting up in the w/c with all needs met, chair alarm set, and call bell within reach. [name] present.    Session 2 Pt received sitting in the w/c with no c/o pain. He was agreeable to shower this session. He completed functional mobility into the  bathroom with the RW with CGA. He was unable to sequence turning the RW to sit on the TTB and was trying to bring the RW into the small space between the TTB and wall. He doffed LB clothing with min A seated. He washed UB seated with (S), CGA for LB bathing standing, with min cueing required to hold onto grab bar in standing. Following shower he transferred back to the EOB with the RW, CGA. He donned shirt with (S), pants with min A to thread over RLE. He returned to supine and was able to scoot up in bed with min cueing for technique. He was left supine with all needs met, bed alarm set.    Therapy Documentation Precautions:  Precautions Precautions: Fall, Sternal, Other (comment) Precaution Booklet Issued: No Precaution Comments: cortrak, sternal precautions Restrictions Weight Bearing Restrictions Per Provider Order: No RUE Weight Bearing Per Provider Order: Weight bearing as tolerated LUE Weight Bearing Per Provider Order: Weight bearing as tolerated   Therapy/Group: Individual Therapy  [name] 04/16/2023, 6:30 AM

## 2023-04-16 NOTE — Progress Notes (Signed)
Speech Language Pathology Weekly Progress and Session Note  Patient Details  Name: Walter Vargas MRN: [id number] Date of Birth: [birth year]  Beginning of progress report period: April 07, 2023 End of progress report period: April 16, 2023  Today's Date: 04/16/2023 SLP Individual Time: 8657-8469 SLP Individual Time Calculation (min): 40 min  Short Term Goals: Week 3: SLP Short Term Goal 1 (Week 3): Patient will orient to date and time using available external aids with mod assist. SLP Short Term Goal 1 - Progress (Week 3): Met SLP Short Term Goal 2 (Week 3): Patient will utilize external memory aids to recall daily information with 50% accuracy given mod assist. SLP Short Term Goal 2 - Progress (Week 3): Met SLP Short Term Goal 3 (Week 3): Patient will demonstrate intellectual awareness of deficits as evidenced by stating 2 cognitive and 2 physical deficits with max assist. SLP Short Term Goal 3 - Progress (Week 3): Not met SLP Short Term Goal 4 (Week 3): Patient will solve basic environmental problems with 75% accuracy given mod assist. SLP Short Term Goal 4 - Progress (Week 3): Met SLP Short Term Goal 5 (Week 3): Patient will recall and utilize swallow safety strategy of intermittent throat clear during assessment of least restrictive diet tolerance 90% of the time given mod assist. SLP Short Term Goal 5 - Progress (Week 3): Met  New Short Term Goals: Week 4: SLP Short Term Goal 1 (Week 4): Patient will utilize external memory aids to recall daily information with 80% accuracy given mod assist. SLP Short Term Goal 2 (Week 4): Patient will demonstrate intellectual awareness of deficits as evidenced by stating 2 cognitive and 2 physical deficits with max assist. SLP Short Term Goal 3 (Week 4): Patient will solve basic environmental problems with 90% accuracy given mod assist.  Weekly Progress Updates: Patient has made excellent gains towards therapy goals this reporting period, meeting  4/5 short term goals set. Patient currently orients to date and time using external aids and mod assist. He recalls daily information with 50-60% accuracy when given mod assist and solves basic environmental problems in 3/4 opportunities when given mod assist. He tolerates regular/thin liquid diet with supervision. Patient's biggest barrier remains intellectual awareness of deficits, with patient requiring total assist to state cognitive and physical deficits which prevent him from completing functional environmental and ADL tasks. Patient and family education ongoing. Patient will continue to benefit from skilled therapy services during remainder of CIR stay.    Intensity: Minumum of 1-2 x/day, 30 to 90 minutes Frequency: 3 to 5 out of 7 days Duration/Length of Stay: pending SNF Treatment/Interventions: Cognitive remediation/compensation;Environmental controls;Cueing hierarchy;Functional tasks;Therapeutic Activities;Therapeutic Exercise;Internal/external aids;Medication managment;Patient/family education;Dysphagia/aspiration precaution training  Daily Session  Skilled Therapeutic Interventions: SLP conducted skilled therapy session targeting dysphagia management and cognitive retraining goals. Upon SLP entry, patient finishing breakfast. Consumed regular/thin liquid textures with no overt s/sx of penetration/aspiration and supervision cues for swallow safety strategies. Patient then tolerated medications crushed in puree (per preference) without difficulty. During meal, SLP provided mod assist for basic environmental problem solving. In remaining minutes of session, SLP targeted problem solving and executive function during clock drawing task. Patient added numbers to clock with min-mod assist, then required mod-max assist to correctly place hands on the clock with hands originating from the middle and pointing to the requested numbers. Patient was left in lowered bed with call bell in reach and bed alarm  set. SLP will continue to target goals per plan of care.  Pain Pain Assessment Pain Scale: 0-10 Pain Score: 0-No pain  Therapy/Group: Individual Therapy  Jeannie Done, M.A., CCC-SLP  Yetta Barre 04/16/2023, 9:00 AM

## 2023-04-16 NOTE — Progress Notes (Signed)
Physical Therapy Session Note  Patient Details  Name: Walter Vargas MRN: [id number] Date of Birth: [birth year]  Today's Date: 04/16/2023 PT Individual Time: 1104-1159 PT Individual Time Calculation (min): 55 min   Short Term Goals: Week 3:  PT Short Term Goal 1 (Week 3): STGs = LTGs  Skilled Therapeutic Interventions/Progress Updates:     Pt received seated in [facility] and agrees to therapy. No complaint of pain. Pt perseverates on phone not working, although per pt's friend, [name], phone appears to be operating normally. PT provides education on importance of finding sources of entertainment and orientation, as pt tends to sit in a quiet room without external sources of stimulation. PT recommends watching television following session and pt is agreeable.  WC transport to gym. PT suggests gait training with rollator (pt has personal rollator in room), but pt states that he does not feel comfortable with rollator as it can "get away from you if you're not careful", demonstrating good safety awareness. Instead, pt ambulates with weighted shopping cart to mimic community, as well as providing pt with external cue to maintain upright posture to improve balance. Pt ambulates x450' with close supervision and cues for safety and navigation. Seated rest break. Pt then ambulates x100' without AD to challenge balance, requiring minA with cues to widen base of support due to pt hitting heels together with each gait cycle. Following rest break, pt ambulates additional 125' x2 with seated rest break. WC transport back to room. Pt left seated in WC with alarm intact and all needs within reach.   Therapy Documentation Precautions:  Precautions Precautions: Fall, Sternal, Other (comment) Precaution Booklet Issued: No Precaution Comments: cortrak, sternal precautions Restrictions Weight Bearing Restrictions Per Provider Order: No RUE Weight Bearing Per Provider Order: Weight bearing as tolerated LUE Weight Bearing  Per Provider Order: Weight bearing as tolerated   Therapy/Group: Individual Therapy  Beau Fanny, PT, DPT 04/16/2023, 3:47 PM

## 2023-04-16 NOTE — Progress Notes (Signed)
PROGRESS NOTE   Subjective/Complaints:  Nursing reports overnight patient refused melatonin due to keeping him awake and all bowel meds/Pepcid, despite ongoing complaints of nausea and constipation.  Otherwise, no events overnight  75 to 50% of meals per record, is consistently skipping 1 meal daily and continues to lose weight every day.  Again, reinforced with patient need for improve intakes and especially protein for healing, he thinks he is eating and drinking enough.  Denies any nausea, simply states that he does not like the hospital food.  After discussion of risks versus benefits, he is agreeable to proceed with appetite stimulant.  Incontinent of bowel and bladder, last bowel movement 1-31, small type II  Blood pressure looking much better this week, heart rate improved. Labs with improving hyponatremia, hyper kalemia resolved, other parameters stable.  On exam, patient states that he is not eating much because he is in the hospital.  He is tired of being here, and wishes to go home.  He thinks he would do better at home, even if he is not able to get any family or friends support to provide supervision level of care.  Feels the hospital is keeping him "on purpose", and is somewhat resentful that he is still here.  Is unable to recall if he has discussed these feelings with [name], and to what extent.  Patient wishes to know if he can leave the hospital AMA; is agreeable for capacity evaluation today with psychiatry to evaluate if he is competent to make this decision.  ROS: Denies fevers, chills,  abd pain, vomiting, diarrhea, SOB, cough, chest pain, new weakness or paraesthesias.   + Nausea w/ pills crushed in food-- improved + anxiety--ongoing + Poor sleep -improving intermittently  + Orthostasis -mildly improved  Objective:   No results found.   Recent Labs    04/16/23 0536  WBC 4.4  HGB 12.9*  HCT 39.8  PLT  198       Recent Labs    04/16/23 0536  NA 133*  K 4.2  CL 103  CO2 23  GLUCOSE 76  BUN 12  CREATININE 0.80  CALCIUM 8.7*      Intake/Output Summary (Last 24 hours) at 04/16/2023 1308 Last data filed at 04/15/2023 1326 Gross per 24 hour  Intake 240 ml  Output --  Net 240 ml      Pressure Injury 03/15/23 Coccyx Mid;Lower Stage 2 -  Partial thickness loss of dermis presenting as a shallow open injury with a red, pink wound bed without slough. (Active)  03/15/23 0800  Location: Coccyx  Location Orientation: Mid;Lower  Staging: Stage 2 -  Partial thickness loss of dermis presenting as a shallow open injury with a red, pink wound bed without slough.  Wound Description (Comments):   Present on Admission: Yes    Physical Exam: Vital Signs Blood pressure 106/66, pulse (!) 58, temperature 98.6 F (37 C), resp. rate 16, height 5\' 8"  (1.727 m), weight 49.7 kg, SpO2 99%.  Constitution: Appropriate appearance for age. No apparent distress. Cachectic appearance. HENT: MMM Resp:   clear to auscultation bilaterally.  On room air, no respiratory distress. Cardio: normal rate, reg rhythm.  Chronic bilateral lower extremity skin changes. No edema.  Abdomen: soft, Nondistended. Nontender,  +BS throughout, normoactive  Psych: Anxious, hyper fixated on going home/leaving hospital.  Skin:   + Right inner thigh/calf with scabbing from vein harvest site - healed + Peripheral IV, C/D/I  MSK: Severe lumbar levoscoliosis.   Neurologic Exam:  Awake, alert, and oriented to self, place, and time + Moderate memory and higher cognitive deficits  - ongoing.   Follows all simple commands. Cranial nerves II through XII intact.  Sensation intact.  No fine motor deficits.  No ataxia. Insight: Poor to fair insight into current condition Strength: Antigravity against resistance all 4 extremities grossly 5- out of 5 throughout   Assessment/Plan: 1. Functional deficits which require 3+ hours per  day of interdisciplinary therapy in a comprehensive inpatient rehab setting. Physiatrist is providing close team supervision and 24 hour management of active medical problems listed below. Physiatrist and rehab team continue to assess barriers to discharge/monitor patient progress toward functional and medical goals  Care Tool:  Bathing    Body parts bathed by patient: Right arm, Left arm, Chest, Abdomen, Front perineal area, Buttocks, Right upper leg, Left upper leg, Face, Right lower leg, Left lower leg   Body parts bathed by helper: Right lower leg, Left lower leg     Bathing assist Assist Level: Supervision/Verbal cueing     Upper Body Dressing/Undressing Upper body dressing   What is the patient wearing?: Pull over shirt    Upper body assist Assist Level: Supervision/Verbal cueing    Lower Body Dressing/Undressing Lower body dressing      What is the patient wearing?: Pants, Underwear/pull up     Lower body assist Assist for lower body dressing: Minimal Assistance - Patient > 75%     Toileting Toileting    Toileting assist Assist for toileting: Minimal Assistance - Patient > 75%     Transfers Chair/bed transfer  Transfers assist     Chair/bed transfer assist level: Contact Guard/Touching assist     Locomotion Ambulation   Ambulation assist      Assist level: Contact Guard/Touching assist Assistive device: Walker-rolling Max distance: 150'   Walk 10 feet activity   Assist  Walk 10 feet activity did not occur: Safety/medical concerns  Assist level: Contact Guard/Touching assist Assistive device: Walker-rolling   Walk 50 feet activity   Assist Walk 50 feet with 2 turns activity did not occur: Safety/medical concerns  Assist level: Contact Guard/Touching assist Assistive device: Walker-rolling    Walk 150 feet activity   Assist Walk 150 feet activity did not occur: Safety/medical concerns         Walk 10 feet on uneven surface   activity   Assist Walk 10 feet on uneven surfaces activity did not occur: Safety/medical concerns         Wheelchair     Assist Is the patient using a wheelchair?: Yes Type of Wheelchair: Manual Wheelchair activity did not occur:  (Sternal precautions)  Wheelchair assist level: Dependent - Patient 0% Max wheelchair distance: 150'    Wheelchair 50 feet with 2 turns activity    Assist    Wheelchair 50 feet with 2 turns activity did not occur: Safety/medical concerns       Wheelchair 150 feet activity     Assist  Wheelchair 150 feet activity did not occur: Safety/medical concerns       Blood pressure 106/66, pulse (!) 58, temperature 98.6 F (37 C), resp. rate 16, height 5'  8" (1.727 m), weight 49.7 kg, SpO2 99%.  Medical Problem List and Plan: 1. 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  Sternal precautions             -patient may shower -ELOS/Goals: 14-18 days supervision to mod I-- Placement/TBD  - 1/23: Per patient/family request, pursuing SNF placement due to inability of caregivers to provide 24/7 support with ongoing min assist ADLs and cognitive deficits requiring assistance   -Stable to continue CIR 1/14: Needs to be independent at discharge per SW; son has moved out of state and can maybe provide 1-2 weeks on discharge. Patient's son's GF has provided some support in the hospital. Carryover very poor; Max-total assist with sternal precautions, Min A if able to push up. Difficulty with LBD and toiletting tasks - will forget what he is doing with toileting tasks. Per PT has been needing lots of help at home too. Per SW presenting like dementia cog-wise. Doing well with Dys 3. Will need assistance at discharge; SW to discuss with family.  1-16: Discussed patient dispo requirements with son [name], emphasizing need for full supervision at discharge and  anticipating will be greater than a 1 to 2 months needed.  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  After discussion with social work, family still unsure who will come in for training, will likely provide supervision for 2 weeks at home prior to deciding whether patient needs more oversight. 1/21: At bedtime incontinence per nursing. Per SW family not agreeing to care plan involving [name]/her mother, which was offered; this is consistent with patient's wishes as well. Unsure of dispo plan at this time as family is not offering alternatives.  1-22: Family discussed appealing discharge with social work due to feeling patient is not medically optimized.  Per team, primary barriers remain cognitive, not expected to improve further with additional time. 1/28: Per SLP, patient panicked with whole medications even though he complains of crushed; he prefers them to be "dust". Markham Jordan is coming next week. Upgraded to regular diet. Ongoing evening behaviors.   2.  Antithrombotics: -DVT/anticoagulation:  Pharmaceutical: Lovenox 40mg  daily.  Venous Doppler studies negative--intermittently refusing daily Lovenox. -antiplatelet therapy: Aspirin 81 mg daily.  3. Pain Management: Oxycodone as needed--no complaints  4. Mood/Behavior/Sleep: Provide emotional support             - antipsychotic agents: Seroquel 25 mg daily at 1000 and 50 mg nightly   - Continue current regimen, can add melatonin 5 mg as needed  -1-13: Will start to wean off antipsychotics; DC AM Seroquel for tomorrow 1-15: Patient tolerated well, change nightly Seroquel to as needed--has resulted in somewhat interrupted sleep but so far has been tolerable.  Noel Christmas states he gets anxious at night sometimes regarding dispo issues. 1/17: No PRNs used.  Continue melatonin, will add nightly as needed trazodone 50 mg retime at bedtime  medications to 8 PM given this is his normal bedtime--improved 1-20: Family reporting compulsive calls, anxieties in the early evenings between therapies and sleep.  Patient is sleeping well, no hallucinations or signs of delirium at this time.  Likely early sundowning, adding Atarax for anxiety and informed nursing to be aware of these behaviors. 04-03-21: No reports by nursing overnight, no use of as needed Atarax 1-24: Increasing anxiety and confused behavior since family elected going to  SNF; encouraged use of PRN Atarax, start BuSpar 5 mg twice daily 1-28: Adding mirtazapine 7.5 mg nightly for sleep, appetite stimulation, and ongoing anxiety reported by staff. 1/29: DC buspar due to no effect, addition of mirtazepine.  1/30: DC mirtazepine due to persistent orthostasis  1/31: Endorses he slept well overnight, continue current management -04/14/23 endorses poor sleep but isn't really sure; monitor for now 2-3: Refused melatonin overnight.  Moved to as needed  5. Neuropsych/cognition: This patient is not capable of making decisions on his own behalf.  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  Weaning sedating medications as above.  Will start Aricept 5 mg nightly-theoretical risk of arrhythmia, however on literature review cardiac events matched placebo on meta-analysis. 1-15: Tolerating Aricept well--and noticing some mild cognitive improvements 2-3: Psychiatry consulted for capacity evaluation, determined patient does not currently have capacity and cannot sign himself out AMA. Appreciate their insiight and assitance.   6. Skin/Wound Care: Routine skin checks. -His right lower extremity harvest site completing course of Ancef for cellulitis --1/8: transitioned to Augmentin 1-8 to complete 10-day course--appearance slightly more edematous/erythematous 1-14, will get WBC in a.m. and monitor closely,  still staying within margins of prior marking so may just be taking a long time to resolve  1-13: DC PICC line--site looks clean. -1-17: Right clavicle staples removed.  Remove abdominal sutures today. - 1/21: RLE swelling/infection resolved 1-22: Dry skin overlying prior infection site; add Eucerin daily--appearance improving 2/3: DC PIV  7. Fluids/Electrolytes/Nutrition/hyponatremia: Routine in and outs with follow-up chemistries, continue supplements.  -1-8: Low albumin, protein; continue current supplemental tube feeds as below.  Encourage protein and p.o. intakes.  Very mild hyponatremia, will recheck on Friday. 1-10: Hyponatremia 129, slight downtrend from 130.  Repeat BMP, serum osmol, urine studies in a.m. to confirm etiology--will be somewhat complicated by use of torsemide.  Placed on 1200 cc fluid restriction; patient is remaining well below this. -03/24/23 Na up to 132, SOsm WNL, urine studies not done yet -03/25/23 Urine Osm still not done... scheduled weekly labs starting Monday - 1/13: Na improved/stable - repeat Wednesday--add magnesium to today's labs--2.1, stable 1-15: NA stable 131; add calcium supplementation, PRN tums, and increase protonic to 40 mg BID for at bedtime nausea 1-17: Added as needed Zofran 4 mg every 8 hours, and miscellaneous nursing order to try and divide out medications and not give all at once crushed in applesauce due to intolerance of foul taste 1-20: Only 1 use of Zofran over the weekend, no recorded emesis --still having nausea around crushed pills only -04/08/23 BMP done yesterday afternoon, Na 132 stable, BUN 47 and Cr 1.44 which is up-- encourage PO fluids today, recheck tomorrow; will hold off on starting fluids just yet.  1-27: Sodium remains downtrending 129, BUN and creatinine slightly improved.  P.o. intakes appear low Is.  Start 500 cc IV fluids today, repeat in AM. 1-28: Sodium improved to 131 with IV fluids and DC torsemide.  Continue regular  monitoring of sodium 1-30: Na back down to 128; DC BP medications, DC mirtazepine, start IVF NS 50 cc/hr for 1 day. CMP in AM. Now off confounding meds will plan for urine studies in AM to eval for SIADH 1-31: NA back to 131.  Unfortunately, still orthostatic, continue IV fluids.  Serum osmolality within normal limits, awaiting urine studies for full picture.-- UOsm 666 WNL, UNa 77--concentrating appropriately 2-3: Sodium improved 133 off of IV fluids.  8.  Acute blood loss anemia.  Follow-up CBC -stable   - HgB improving/stable  9.  COVID-positive.  Completed Paxlovid  -1-17: DC schedule guaifenesin.  10.  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  Seen by vascular surgery Dr Carolynn Sayers and plan outpatient follow-up 1 month  11.  History of VT peri-CABG.  Amiodarone 200 mg BID>>daily, Lanoxin 0.125 mg daily  -Remains with low/stable heart rate -1-14: Patient reporting irregular palpitations during therapies.  None documented, made PT aware so they can document any events during their therapies today.--did not occur per therapies 1/14 1-29: Adjusting blood pressure medications as below; may need to adjust amiodarone to 100 mg daily if no improvement with adjustment of diuretics--ongoing 1-31: Reached out to cardiology regarding ongoing orthostasis with multiple recent medication adjustments.  They have recommended discontinuation of Farxiga, as this can cause orthostasis, also possible contributed to hyperkalemia.  Will do this today, continue other medications at current dose 2-3: Vitals appear improved.  Heart rate staying stable.   12.  Hyperlipidemia.  Lipitor 80mg  daily 13.  Diastolic congestive heart failure.  Torsemide 20 mg daily, Cozaar 25 mg daily, Aldactone 25 mg daily. Dapagliflozin 10mg  daily. Monitor for any signs of fluid overload.  Daily weights.  1-24: Weights with ongoing downtrend, per record patient has adequate p.o. intakes and  compliant with nutrition shakes between meals, now with reports of orthostasis and blood pressure 80 over 50s this afternoon.  Reduce torsemide to 10 mg daily, and encourage p.o. fluids. - Orthostatic vitals in AM ordered -1/25-26/25 wt fairly stable; monitor 1/27: Increasingly  low blood pressure, will DC torsemide today and encourage p.o. fluids, initiate 500 cc IV fluids at 50 cc/h today.  BMP in AM. 1-28: Weights have stabilized today.  Continue monitoring for 1 to 2 days, then consider resumption of torsemide at lower dose. 1/29: Worsening orthostasis despite holding diuretic torsemide, no peripheral signs of fluid overload, although weights have normalized at this point.  DC spironolactone 25 mg daily and Cozaar 25 mg daily, monitor closely.  Encourage p.o. fluids. 1/30: Continued orthostasis, weights stable and euvolemic; add IVF as above 1-31: Blood pressure mildly improved with IV fluid, but continues to be orthostatic.  Continue for today, continue to emphasize to patient encourage of p.o. fluid intake as well.  DC Farxiga per cardiology as above. 2-3: Blood pressures improved since DC Farxiga.  Continue current management.  Filed Weights   04/14/23 0908 04/15/23 0500 04/16/23 0533  Weight: 50.1 kg 50.2 kg 49.7 kg    14.  Sacral wound.  Wound care nurse follow-up.  Covered with foam dressing -1-16: Caught himself on the back during a toileting transfer today per nursing, resulting in small wound, covered in barrier dressing.  15.  Dysphagia/severe protein calorie malnutrition.  Dysphagia #2 thin liquids.   -Cortrak on intake; Continue supplemental tube feeds as above, encourage p.o. intakes, protein. -1-10: Eating 60 to 90% of meals per documentation.  Discontinue core track, tube feeds.--Continuing to eat well 1/28: P.o. intakes adequate per record, but cachectic appearance and continuing to lose weight.  Somewhat improved with adjustment of diuretics as above.  Will start mirtazapine  for appetite stimulation. 1-29: Dietary evaluated, continue current management with BID ensure shakes 1/30: Continues poor PO intakes despite considerable encouragement; unfortunately not tolerating mirtazepine 1-31: Family bringing in food that he likes today.  Encouraged consumption of protein shakes, Magic cups between meals.   Has lost ~20 lbs  since admission 1/8; some due to diuresis, eating 25-80% meals (avg 60%), continues to lose weight -  Nutrition onboard and assisting.    2-3: Weights continue to downtrend.  Increasing Ensure to 3 times daily between meals. DC Pepcid due to patient refusal. Adding appetite stimulant marinol 2.5 mg BID today.   16. Stool regimen: colace 200mg  daily  -1-17: DC liquid Colace, having regular bowel movements  -03/31/23 no BM in 3 days, will restart colace at 100mg  daily for now.  -04/01/23 pt states BM yesterday but none documented since 1/15, nurse documentation states LBM 1/16, unclear which is correct; asked nursing to please document if he's had BM today, if none today then would advance bowel regimen --1-20: Increase Colace to 100 mg twice daily, add Senokot S1 tab at night  -1/24 LBM  -04/07/23 no good BM since 1/21, just a smear on 1/24; hard palpable stool in lower abdomen, pt feels it in rectum; will do sorbitol then mineral oil enema. Hopefully this improves it.  -04/08/23 multiple BMs yesterday with enema/meds, feels better; cont regimen  -04/15/23 LBM 2d ago, cont regimen but if no BM by tomorrow, may want to add miralax or increase senokot-- likely not eating enough to have daily BMs either...  2-3: Refusing stool softeners and Pepcid.  Move Senokot S to as needed  17. Thrombocytosis.  resolved   LOS: 27 days A FACE TO FACE EVALUATION WAS PERFORMED  Angelina Sheriff 04/16/2023, 8:17 AM

## 2023-04-17 LAB — COMPREHENSIVE METABOLIC PANEL
ALT: 38 U/L (ref 0–44)
AST: 34 U/L (ref 15–41)
Albumin: 3.2 g/dL — ABNORMAL LOW (ref 3.5–5.0)
Alkaline Phosphatase: 104 U/L (ref 38–126)
Anion gap: 10 (ref 5–15)
BUN: 22 mg/dL (ref 8–23)
CO2: 25 mmol/L (ref 22–32)
Calcium: 9.1 mg/dL (ref 8.9–10.3)
Chloride: 98 mmol/L (ref 98–111)
Creatinine, Ser: 0.89 mg/dL (ref 0.61–1.24)
GFR, Estimated: >60 mL/min (ref >60)
Glucose, Bld: 94 mg/dL (ref 70–99)
Potassium: 4.7 mmol/L (ref 3.5–5.1)
Sodium: 133 mmol/L — ABNORMAL LOW (ref 135–145)
Total Bilirubin: 0.8 mg/dL (ref 0.0–1.2)
Total Protein: 6.9 g/dL (ref 6.5–8.1)

## 2023-04-17 LAB — T4, FREE: Free T4: 0.84 ng/dL (ref 0.61–1.12)

## 2023-04-17 LAB — MAGNESIUM: Magnesium: 1.7 mg/dL (ref 1.7–2.4)

## 2023-04-17 LAB — VITAMIN B12: Vitamin B-12: 474 pg/mL (ref 180–914)

## 2023-04-17 LAB — FOLATE: Folate: >40 ng/mL (ref >5.9)

## 2023-04-17 LAB — TSH: TSH: 6.534 u[IU]/mL — ABNORMAL HIGH (ref 0.350–4.500)

## 2023-04-17 MED ORDER — ALPRAZOLAM 0.5 MG PO TABS: 0.5000 mg | ORAL_TABLET | Freq: Once | ORAL | Status: DC | PRN

## 2023-04-17 MED ORDER — VITAMIN C 500 MG PO TABS
500.0000 mg | ORAL_TABLET | Freq: Every day | ORAL | Status: DC
  Administered 2023-04-17 – 2023-04-24 (×8): 500 mg via ORAL
  Filled 2023-04-17 (×8): qty 1

## 2023-04-17 MED ORDER — BOOST / RESOURCE BREEZE PO LIQD CUSTOM
1.0000 | Freq: Three times a day (TID) | ORAL | Status: DC
Start: 2023-04-17 — End: 2023-04-25
  Administered 2023-04-17 – 2023-04-24 (×9): 1 via ORAL

## 2023-04-17 MED ORDER — PROSOURCE PLUS PO LIQD
30.0000 mL | Freq: Two times a day (BID) | ORAL | Status: DC
  Administered 2023-04-17 – 2023-04-24 (×5): 30 mL via ORAL
  Filled 2023-04-17 (×9): qty 30

## 2023-04-17 NOTE — Progress Notes (Signed)
 Patient ID: Walter Vargas, male   DOB: 1945-07-26, 78 y.o.   MRN: [id number]  0920- SW left message for pt son [name] Jeanett) informing on updates from psychiatry reporting pt is not able to make own decisions, and would like discuss next steps as since it has been determined to make decisions, family will need to apply for guardianship. SW waiting on follow-up  0921- SW spoke with pt dtr Marolyn to provide above updates. She was not pleased with information and did not think he was given a fair chance since he has been in the hospital so long because when she speaks with him is just fine. She was short with SW, and SW shared calling to provide information and ask if she can have her brother [name] follow-up so I can share all details. SW also informed on challenges with obtaining SNF because no LTC Medicaid application in place. She will relay information to her brother.   0956-SW spoke with pt sister Steffi to provide above updates. She understands. SW explained will need someone to apply for guardianship, and if the family does not feel they are able to provide care, we have to make an APS referral. States she will share updates with her nephew and her husband as well.   *SW received message from pt son Geryl reporting he has a meeting at [employer] and if I am not able to call him back by this time, he will follow-up later when he is on his lunch. SW unable to call back as in team conference until 1130a.   1452- SW left message for pt son [name] requesting follow-up to provide updates, and hopeful his family was able to make contact to share details with him. SW waiting on follow-up.    [name], MSW, LCSW Office: [phone] Cell: [phone] Fax: [phone]

## 2023-04-17 NOTE — Progress Notes (Signed)
 Occupational Therapy Session Note  Patient Details  Name: Walter Vargas MRN: [id number] Date of Birth: 1945/09/19  Today's Date: 04/17/2023 OT Individual Time: 8955-8879 OT Individual Time Calculation (min): 36 min    Short Term Goals: Week 4:  OT Short Term Goal 1 (Week 4): Pt will complete toileting tasks with CGA OT Short Term Goal 2 (Week 4): Pt will complete LB dressing with CGA OT Short Term Goal 3 (Week 4): Pt will properly place UE during sit <> stands with external cueing  Skilled Therapeutic Interventions/Progress Updates:    Pt received sitting in the w/c with no c/o pain, agreeable to OT session. He completed standing level toileting with CGA using urinal- managing his own urinal, voiding 100 cc. Financial advisor arrived to get pt signature for medicaid forms. OT exited room for 10 min to assist with staff emergency. Pt then stood with CGA using the RW, good placement of hands. He completed 200 ft of functional mobility with CGA - (S) using the RW. He completed BUE strengthening circuit with a 4lb dumbbell within sternal precautions. 3x8-10 repetitions. Completed to challenge BUE strength and endurance required for maximal independence with ADLs and ADL transfers. Cueing required to ensure proper form and technique for proper muscle activation. He completed 100 ft of functional mobility back to the room with no device- min A- to increase dynamic standing balance and functional activity tolerance. Pt was left sitting up in the w/c with all needs met, chair alarm set, and call bell within reach.    Therapy Documentation Precautions:  Precautions Precautions: Fall, Sternal, Other (comment) Precaution Booklet Issued: No Precaution Comments: cortrak, sternal precautions Restrictions Weight Bearing Restrictions Per Provider Order: No RUE Weight Bearing Per Provider Order: Weight bearing as tolerated LUE Weight Bearing Per Provider Order: Weight bearing as tolerated  Therapy/Group:  Individual Therapy  [name] 04/17/2023, 11:01 AM

## 2023-04-17 NOTE — Progress Notes (Signed)
 Physical Therapy Session Note  Patient Details  Name: Walter Vargas MRN: [id number] Date of Birth: 07/11/1945  Today's Date: 04/17/2023 PT Individual Time: 0802-0858 PT Individual Time Calculation (min): 56 min    Today's Date: 04/17/2023 PT Individual Time: 1300-1345 PT Individual Time Calculation (min): 45 min    Short Term Goals: Week 3:  PT Short Term Goal 1 (Week 3): STGs = LTGs  Skilled Therapeutic Interventions/Progress Updates:     1st Session: Pt received supine in bed and agrees to therapy and is eager to get out of bed. Pt performs bed mobility with cues for positioning. PT encourages pt to put on his own clothing, which is available and clean, but pt refuses. Pt requests paper scrubs despite repeated encouragement to wear personal clothing. PT threads paper scrubs over legs and pt is able to don shoes with supervision and cues for safety. Pt ambulates to main gym with close supervision and cues for upright posture and increased proximity to RW for safety. Pt completes Nustep for endurance training. Pt completes 2x5:00 at workload of 5 with average steps per minute ~5. PT provides cues for hand and foot placement and completing full available ROM. Following rest break, pt ambulates 2x350' with similar assistance and cues. Extended seated rest break. WC transport back to room. Left seated with alarm intact and all needs within reach.   2nd Session: Pt received seated in [facility] and agrees to therapy. No complaint of pain. WC transport to gym for time management. Pt attempts standing balance activity, tasked with standing on low red wedge on incline and reaching to remove squigz from mirror. Intended to challenge balance and coordination and encourage anterior shift of center of gravity. Pt is unable to maintain balance on wedge, despite max verbal cues and demonstration of proper body mechanics and weight shifting. Pt has consistent posterior lean that requires modA to maxA to prevent fall.  Activity terminated due to pt's inability to attain static standing balance on wedge. Following rest break, pt stands and ambulates x175' with minA, and no AD increasing to modA with fatigue and during turns. PT provides cues to widen base of support and maintain upright gaze to improve posture and balance. Seated rest break. Following extended seated rest break, pt ambulates additional x175', again requiring modA during turns with pt corssing over during gait cycle and losing balance several times. WC transport back to room. Left seated with alarm intact and all needs within reach.   Therapy Documentation Precautions:  Precautions Precautions: Fall, Sternal, Other (comment) Precaution Booklet Issued: No Precaution Comments: cortrak, sternal precautions Restrictions Weight Bearing Restrictions Per Provider Order: No RUE Weight Bearing Per Provider Order: Weight bearing as tolerated LUE Weight Bearing Per Provider Order: Weight bearing as tolerated  Therapy/Group: Individual Therapy  Elsie JAYSON Dawn, PT, DPT 04/17/2023, 4:18 PM

## 2023-04-17 NOTE — Progress Notes (Signed)
 Patient would not take the colace stating it was too early in the morning and it would mess up his day. Nurse attempted to reorient patient to time and educate patient that the medication was only a stool softener. Patient said he would take it later and is still insistent that it is morning.

## 2023-04-17 NOTE — Patient Care Conference (Signed)
 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 Joesph Likes Social Worker Present: [name], LCSWA Nurse Present: Barnie Ronde, [place], RN PT Present: Kirt Dawn, PT OT Present: Nena Moats, OT SLP Present: Rosina Downy, SLP PPS Coordinator present : Burnard Mealing, OT     Current Status/Progress Goal Weekly Team Focus  Bowel/Bladder   incontinent of b/b [facility] 1/31. Patient has been refusing stool softeners  patient will take laxative and stool softener daily until regular bowel movements   continue to educate patient on the importance of taking bowel medication    Swallow/Nutrition/ Hydration   regular/thin liquid diet. Supervision. At/above goal level   minA for use of strategies  continued diet tolerance    ADL's                Mobility   supervision to CGA all mobility, ambulating with RW   Supervision  continued work on endurance, cognition, balance, strength, ambulation    Communication                Safety/Cognition/ Behavioral Observations  mod assist overall for basic tasks, continues to exhibit slow and mild improvements overall. Participation has improved. Can be up to max assist for some aspects of environmental problem  solving and is total assist for awareness of deficits   mod assist   attention, carryover, memory notebook use, intellectual awareness, orientation    Pain   no c/o pain   maintain pain free   assess pain qshift and PRN    Skin   stratches on arms and leg. redness on bottom and blister on right arm   maintain infection free wounds and improve skin integrity  assess skin qshift and prn      Discharge Planning:  D/c is pending skilled bed offer and then insruance approval; because no LTC Mediciad in place this serves as barrier for placement and also no discharge plan. if unable to get bed, pt will transition to home. Pt needs to discharge to home Mod I as there is limited support at discharge. Pt son will come around 2/6 or 2/7 to visit with pt and will not be here long. SW emailed pt son Medicaid application; Medicaid application also left in room for pt family friends to assist. HHPT/OT/SLP/aideRN with [facility]. SW will confirm there are no barriers to discharge.   Team Discussion: Patient admitted post post cardiogenic shock with cardiac debility, consistent decrease in weight, orthostasis, with  poor cognition.  Patient on target to meet rehab goals: no, no change in current functional status due to medical condition and cognitive challenges.  *See Care Plan and progress notes for long and short-term goals.   Revisions to Treatment Plan:  Marinol  was added to medication regimen  Teaching Needs: Safety, medications, transfers, dietary modifications,etc  Current Barriers to Discharge: Decreased caregiver support and Incontinence Lack of Insurance for SNF  Possible Resolutions to Barriers: SNF placement Family education Medicaid application pending Home health PT/OT/SLP/aide RN  DME: [facility]     Medical Summary Current Status: medically complicated by dementia/cognitve deficits, heart failure, severe malnutrition, orthostatic hypotension, wound care, anxiety, and  intermittent incontinece  Barriers to Discharge: Behavior/Mood;Cardiac Complications;Electrolyte abnormality;Inadequate Nutritional Intake;Medical stability;Self-care education;Incontinence;Hypotension   Possible Resolutions to [employer]: daily weight and regular monitoring of volume status, intermittent IV fluids for hyponatremia, titration of cardiac medications to balance volume, monitoring of wounds, frequent reorientation, encouragement of nutirional intakes and appetite stimulants   Continued Need for Acute Rehabilitation Level of Care: The patient requires daily medical management by a physician with specialized training in physical medicine and rehabilitation for the following reasons: Direction of a multidisciplinary physical rehabilitation program to maximize functional independence : Yes Medical management of patient stability for increased activity during participation in an intensive rehabilitation regime.: Yes Analysis of laboratory values and/or radiology reports with any subsequent need for medication adjustment and/or medical intervention. : Yes   I attest that I was present, lead the team conference, and concur with the assessment and plan of the team.   Shaketha Jeon Gayo 04/17/2023, 1:55 PM

## 2023-04-17 NOTE — Progress Notes (Addendum)
 Nutrition Follow-up  DOCUMENTATION CODES:   Severe malnutrition in context of chronic illness  INTERVENTION:   Continue to encourage oral intake Ensure Enlive po TID, each supplement provides 350 kcal and 20 grams of protein Prosoruce Plus BID, each supplement provides 100 kcal and 15 gm protein Magic cup with evening meal, each supplement provides 290 kcal and 9 grams of protein 500 mg vitamin C  daily for wound healing Multivitamin with minerals daily   NUTRITION DIAGNOSIS:   Severe Malnutrition related to chronic illness as evidenced by percent weight loss, moderate fat depletion, severe fat depletion, severe muscle depletion.  - Still applicable   GOAL:   Patient will meet greater than or equal to 90% of their needs  - Ongoing   MONITOR:   PO intake, Weight trends, Supplement acceptance, TF tolerance, Skin, Diet advancement, I & O's, Labs  REASON FOR ASSESSMENT:   Consult Enteral/tube feeding initiation and management  ASSESSMENT:   78 y/o male admitted to acute rehab with debility. Initially admitted to acute hospitalization with increasing shortness of breath on 02/10/23 ongoing for a few months with chest tightness and abdominal discomfort.   PMH: HTN, heart failure with reduced ejection fraction, mitral regurgitation, CABG 03/01/23  11/30: admitted 12/19: s/p CABG and impella 5/5 placement 12/20: back to OR for re-exploration d/t post-op bleeding/ABLA; cortrak placed, trickle TF initiated, extubated 12/26 Diet advanced to Dysphagia 1, Thins per SLP 12/27 Changed to nocturnal TF 12/30 OR for Impella removal, Cortrak inadvertently removed during TEE and replaced postop 01/07-MBSS, diet advanced to dysphagia II (finely chopped), thin  liquids, Rehab admission 01/10-diet advanced to dysphagia III per SLP, physician ordered fluid restriction of 1200 mL due to hyponatremia 01/11-Cortrak removed 1/27 - Regular diet   Patient sitting on chair on visit. States he is  eating enough. He reports eating 3 meals per day but question if this is accurate, per Dr.Engler he is skipping at least 1 meal. Only had raisin bran and a blueberry muffin for breakfast. Is eating 50-100% of his meals but they are typically small and do not contain a lot of protein. Patient not willing to try double proteins with meals and states they send so much already. He is drinking at least 1 Ensure per day, however on visit he was drinking an Ensure Max protein which is less calories and protein than Ensure Enlive, messaged RN to make sure he is getting the right one to optimize nutrition. He was hesitant to try prosoruce plus but was willing to try Magic cups with his meals.    Reviewed weight history and he has been losing weight since admission. Discussed the importance of adequate nutrition especially during rehab with patient. Patient is adamant he is eating enough. Per MD stating appetite stimulant.   Admit weight: 57.7 kg Current weight: 48.3 kg    Average Meal Intake: 1/31-2/4: 68% intake x 8 recorded meals  Nutritionally Relevant Medications: Scheduled Meds:  docusate  100 mg Oral BID   dronabinol   2.5 mg Oral BID AC   feeding supplement  237 mL Oral TID BM   multivitamin with minerals  1 tablet Oral Daily   thiamine   100 mg Oral Daily   Labs Reviewed: Sodium 133,  BG ranges from 76-94 mg/dL over the last 24 hours HgbA1c 5.8 (02/2023)   Diet Order:   Diet Order             Diet regular Room service appropriate? Yes; Fluid consistency: Thin  Diet effective now  EDUCATION NEEDS:   Not appropriate for education at this time  Skin:  Skin Assessment: Reviewed RN Assessment Skin Integrity Issues:: Stage II, Other (Comment) Stage II: mid coccyx Incisions: R leg, chest  Last BM:  04/13/2023 type 2  Height:   Ht Readings from Last 1 Encounters:  03/31/23 5' 8 (1.727 m)    Weight:   Wt Readings from Last 1 Encounters:  04/17/23 48.3  kg    Ideal Body Weight:  70 kg  BMI:  Body mass index is 16.19 kg/m.  Estimated Nutritional Needs:   Kcal:  1800-2000 kcal  Protein:  90-110 gm  Fluid:  >1.8L   Olivia Kenning, RD Registered Dietitian  See Amion for more information

## 2023-04-17 NOTE — Progress Notes (Signed)
 PROGRESS NOTE   Subjective/Complaints:  Orthostasis much improved today.  Doing well in therapies.  Patient continues to think that discharge home would improve his cognition, continue to discuss this with him.  Did talk about brief neurologic workup today including MRI brain and labs, patient initially very resistive to MRI brain but cannot explain why.  Eventually agreeable.  Ate 75% of lunch and dinner yesterday; 100% breakfast this AM.  Patient states the Ensure shakes upset his stomach, would prefer to go on Ensure clear. PO fluids approximately 250 daily Refused stool softener this AM  ROS: Denies fevers, chills,  abd pain, vomiting, diarrhea, SOB, cough, chest pain, new weakness or paraesthesias.    + anxiety--ongoing + Poor sleep -improving  + Orthostasis - improved  Objective:   No results found.   Recent Labs    04/16/23 0536  WBC 4.4  HGB 12.9*  HCT 39.8  PLT 198       Recent Labs    04/16/23 0536  NA 133*  K 4.2  CL 103  CO2 23  GLUCOSE 76  BUN 12  CREATININE 0.80  CALCIUM  8.7*      Intake/Output Summary (Last 24 hours) at 04/17/2023 1004 Last data filed at 04/17/2023 9271 Gross per 24 hour  Intake 476 ml  Output --  Net 476 ml      Pressure Injury 03/15/23 Coccyx Mid;Lower Stage 2 -  Partial thickness loss of dermis presenting as a shallow open injury with a red, pink wound bed without slough. (Active)  03/15/23 0800  Location: Coccyx  Location Orientation: Mid;Lower  Staging: Stage 2 -  Partial thickness loss of dermis presenting as a shallow open injury with a red, pink wound bed without slough.  Wound Description (Comments):   Present on Admission: Yes    Physical Exam: Vital Signs Blood pressure 110/60, pulse (!) 55, temperature 97.6 F (36.4 C), resp. rate 18, height 5' 8 (1.727 m), weight 48.3 kg, SpO2 100%.  Constitution: Appropriate appearance for age. No apparent  distress. Cachectic appearance. HENT: MMM.  No deformities. Resp:   clear to auscultation bilaterally.  On room air, no respiratory distress. Cardio: Bradycardic rate, reg rhythm.   Chronic bilateral lower extremity skin changes. No edema.  Capillary refill brisk. Abdomen: soft, Nondistended. Nontender,  +BS throughout, normoactive  Psych: Anxious, hyper fixated on going home/leaving hospital.  Skin:   + Right inner thigh/calf with scabbing from vein harvest site - healed + Peripheral IV, C/D/I  MSK: Severe lumbar levoscoliosis.   Neurologic Exam:  Awake, alert, and oriented to self, place, and time + Moderate to severe memory and higher cognitive deficits  - ongoing.   Otherwise, neurologically intact.  No cranial nerve deficits.  Strength 5 out of 5 and equal throughout.  Sensation equal throughout.  Reflexes equal throughout.  Assessment/Plan: 1. Functional deficits which require 3+ hours per day of interdisciplinary therapy in a comprehensive inpatient rehab setting. Physiatrist is providing close team supervision and 24 hour management of active medical problems listed below. Physiatrist and rehab team continue to assess barriers to discharge/monitor patient progress toward functional and medical goals  Care Tool:  Bathing  Body parts bathed by patient: Right arm, Left arm, Chest, Abdomen, Front perineal area, Buttocks, Right upper leg, Left upper leg, Face, Right lower leg, Left lower leg   Body parts bathed by helper: Right lower leg, Left lower leg     Bathing assist Assist Level: Supervision/Verbal cueing     Upper Body Dressing/Undressing Upper body dressing   What is the patient wearing?: Pull over shirt    Upper body assist Assist Level: Supervision/Verbal cueing    Lower Body Dressing/Undressing Lower body dressing      What is the patient wearing?: Pants, Underwear/pull up     Lower body assist Assist for lower body dressing: Minimal Assistance -  Patient > 75%     Toileting Toileting    Toileting assist Assist for toileting: Minimal Assistance - Patient > 75%     Transfers Chair/bed transfer  Transfers assist     Chair/bed transfer assist level: Contact Guard/Touching assist     Locomotion Ambulation   Ambulation assist      Assist level: Contact Guard/Touching assist Assistive device: Walker-rolling Max distance: 150'   Walk 10 feet activity   Assist  Walk 10 feet activity did not occur: Safety/medical concerns  Assist level: Contact Guard/Touching assist Assistive device: Walker-rolling   Walk 50 feet activity   Assist Walk 50 feet with 2 turns activity did not occur: Safety/medical concerns  Assist level: Contact Guard/Touching assist Assistive device: Walker-rolling    Walk 150 feet activity   Assist Walk 150 feet activity did not occur: Safety/medical concerns         Walk 10 feet on uneven surface  activity   Assist Walk 10 feet on uneven surfaces activity did not occur: Safety/medical concerns         Wheelchair     Assist Is the patient using a wheelchair?: Yes Type of Wheelchair: Manual Wheelchair activity did not occur:  (Sternal precautions)  Wheelchair assist level: Dependent - Patient 0% Max wheelchair distance: 150'    Wheelchair 50 feet with 2 turns activity    Assist    Wheelchair 50 feet with 2 turns activity did not occur: Safety/medical concerns       Wheelchair 150 feet activity     Assist  Wheelchair 150 feet activity did not occur: Safety/medical concerns       Blood pressure 110/60, pulse (!) 55, temperature 97.6 F (36.4 C), resp. rate 18, height 5' 8 (1.727 m), weight 48.3 kg, SpO2 100%.  Medical Problem List and Plan: 1. 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  Sternal precautions             -patient may shower -ELOS/Goals:  14-18 days supervision to mod I-- Placement/TBD  - 1/23: Per patient/family request, pursuing SNF placement due to inability of caregivers to provide 24/7 support with ongoing min assist ADLs and cognitive deficits requiring assistance   -Stable to continue CIR 1/14: Needs to be independent at discharge per SW; son has moved out of state and can maybe provide 1-2 weeks on discharge. Patient's son's GF has provided some support in the hospital. Carryover very poor; Max-total assist with sternal precautions, Min A if able to push up. Difficulty with LBD and toiletting tasks - will forget what he is doing with toileting tasks. Per PT has been needing lots of help at home too. Per SW presenting like dementia cog-wise. Doing well with Dys 3. Will need assistance at discharge;  SW to discuss with family.  1-16: Discussed patient dispo requirements with son [name], emphasizing need for full supervision at discharge and anticipating will be greater than a 1 to 2 months needed.  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  After discussion with social work, family still unsure who will come in for training, will likely provide supervision for 2 weeks at home prior to deciding whether patient needs more oversight. 1/21: At bedtime incontinence per nursing. Per SW family not agreeing to care plan involving [name]/her mother, which was offered; this is consistent with patient's wishes as well. Unsure of dispo plan at this time as family is not offering alternatives.  1-22: Family discussed appealing discharge with social work due to feeling patient is not medically optimized.  Per team, primary barriers remain cognitive, not expected to improve further with additional time. 1/28: Per SLP, patient panicked with whole medications even though he complains of crushed; he prefers them to be dust. Geryl is  coming next week. Upgraded to regular diet. Ongoing evening behaviors.  2/4: SW has been unable to contact [facility] since last week. Awaiting medicaid approval. SW called daughter yesterday, she is skeptical that patient has cognitive deficits limiting independence. Per PT/OT supervision to CGA, with ongoing behavior issues. SLP supervision for swallowing - Mod A for basic tasks with SLP. Will make 15/7 due to limited gains.   2.  Antithrombotics: -DVT/anticoagulation:  Pharmaceutical: Lovenox  40mg  daily.  Venous Doppler studies negative--intermittently refusing daily Lovenox . - ambulation of 450 ft with close SPV yesterday; DC lovenox  2/4  -antiplatelet therapy: Aspirin  81 mg daily.  3. Pain Management: Oxycodone  as needed--no complaints  4. Mood/Behavior/Sleep: Provide emotional support             - antipsychotic agents: Seroquel  25 mg daily at 1000 and 50 mg nightly   - Continue current regimen, can add melatonin 5 mg as needed  -1-13: Will start to wean off antipsychotics; DC AM Seroquel  for tomorrow 1-15: Patient tolerated well, change nightly Seroquel  to as needed--has resulted in somewhat interrupted sleep but so far has been tolerable.  Alyse Domino states he gets anxious at night sometimes regarding dispo issues. 1/17: No PRNs used.  Continue melatonin, will add nightly as needed trazodone  50 mg retime at bedtime medications to 8 PM given this is his normal bedtime--improved 1-20: Family reporting compulsive calls, anxieties in the early evenings between therapies and sleep.  Patient is sleeping well, no hallucinations or signs of delirium at this time.  Likely early sundowning, adding Atarax  for anxiety and informed nursing to be aware of these behaviors. 04-03-21: No reports by nursing overnight, no use of as needed Atarax  1-24: Increasing anxiety and confused behavior since family elected going to SNF; encouraged use of PRN Atarax , start BuSpar  5 mg twice daily 1-28: Adding mirtazapine   7.5 mg nightly for sleep, appetite stimulation, and ongoing anxiety reported by staff. 1/29: DC buspar  due to no effect, addition of mirtazepine.  1/30: DC mirtazepine due to persistent orthostasis  1/31: Endorses he slept well overnight, continue current management -04/14/23 endorses poor sleep but isn't really sure; monitor for now 2-3: Refused melatonin overnight.  Moved to as needed  5. Neuropsych/cognition: This patient is not capable of making decisions on his own behalf.  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  Weaning sedating medications as above.  Will start Aricept  5 mg nightly-theoretical risk of arrhythmia, however on literature review cardiac events matched placebo on meta-analysis. 1-15: Tolerating Aricept  well--and noticing some mild cognitive improvements 2-3: Psychiatry consulted for capacity evaluation, determined patient does not currently have capacity and cannot sign himself out AMA. Appreciate their insiight and assitance.  2-4: Ordering MRI brain, labs including TSH, folate, B12, thiamine  for neurologic contributors to cognitive deficits.  Low suspicion of stroke given no gross motor or sensory deficits.  Patient has as needed Xanax  if needed for MRI.  6. Skin/Wound Care: Routine skin checks. -His right lower extremity harvest site completing course of Ancef  for cellulitis --1/8: transitioned to Augmentin  1-8 to complete 10-day course--appearance slightly more edematous/erythematous 1-14, will get WBC in a.m. and monitor closely, still staying within margins of prior marking so may just be taking a long time to resolve  1-13: DC PICC line--site looks clean. -1-17: Right clavicle staples removed.  Remove abdominal sutures today. - 1/21: RLE swelling/infection resolved 1-22: Dry skin overlying prior infection site; add Eucerin daily--appearance improving 2/3: DC PIV  7.  Fluids/Electrolytes/Nutrition/hyponatremia: Routine in and outs with follow-up chemistries, continue supplements.  -1-8: Low albumin , protein; continue current supplemental tube feeds as below.  Encourage protein and p.o. intakes.  Very mild hyponatremia, will recheck on Friday. 1-10: Hyponatremia 129, slight downtrend from 130.  Repeat BMP, serum osmol, urine studies in a.m. to confirm etiology--will be somewhat complicated by use of torsemide .  Placed on 1200 cc fluid restriction; patient is remaining well below this. -03/24/23 Na up to 132, SOsm WNL, urine studies not done yet -03/25/23 Urine Osm still not done... scheduled weekly labs starting Monday - 1/13: Na improved/stable - repeat Wednesday--add magnesium  to today's labs--2.1, stable 1-15: NA stable 131; add calcium  supplementation, PRN tums, and increase protonic to 40 mg BID for at bedtime nausea 1-17: Added as needed Zofran  4 mg every 8 hours, and miscellaneous nursing order to try and divide out medications and not give all at once crushed in applesauce due to intolerance of foul taste 1-20: Only 1 use of Zofran  over the weekend, no recorded emesis --still having nausea around crushed pills only -04/08/23 BMP done yesterday afternoon, Na 132 stable, BUN 47 and Cr 1.44 which is up-- encourage PO fluids today, recheck tomorrow; will hold off on starting fluids just yet.  1-27: Sodium remains downtrending 129, BUN and creatinine slightly improved.  P.o. intakes appear low Is.  Start 500 cc IV fluids today, repeat in AM. 1-28: Sodium improved to 131 with IV fluids and DC torsemide .  Continue regular monitoring of sodium 1-30: Na back down to 128; DC BP medications, DC mirtazepine, start IVF NS 50 cc/hr for 1 day. CMP in AM. Now off confounding meds will plan for urine studies in AM to eval for SIADH 1-31: NA back to 131.  Unfortunately, still orthostatic, continue IV fluids.  Serum osmolality within normal limits, awaiting urine studies for full  picture.-- UOsm 666 WNL, UNa 77--concentrating appropriately 2-3: Sodium improved 133 off of IV fluids.  8.  Acute blood loss anemia.  Follow-up CBC -stable   - HgB improving/stable  9.  COVID-positive.  Completed Paxlovid   -1-17: DC schedule guaifenesin .  10.  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  Seen by vascular surgery Dr Norman Serve and plan outpatient follow-up 1 month  11.  History of VT peri-CABG.  Amiodarone  200 mg BID>>daily, Lanoxin  0.125 mg  daily  -Remains with low/stable heart rate -1-14: Patient reporting irregular palpitations during therapies.  None documented, made PT aware so they can document any events during their therapies today.--did not occur per therapies 1/14 1-29: Adjusting blood pressure medications as below; may need to adjust amiodarone  to 100 mg daily if no improvement with adjustment of diuretics--ongoing 1-31: Reached out to cardiology regarding ongoing orthostasis with multiple recent medication adjustments.  They have recommended discontinuation of Farxiga , as this can cause orthostasis, also possible contributed to hyperkalemia.  Will do this today, continue other medications at current dose 2-3: Vitals appear improved.  Heart rate staying stable.   12.  Hyperlipidemia.  Lipitor  80mg  daily 13.  Diastolic congestive heart failure.  Torsemide  20 mg daily, Cozaar  25 mg daily, Aldactone  25 mg daily. Dapagliflozin  10mg  daily. Monitor for any signs of fluid overload.  Daily weights.  1-24: Weights with ongoing downtrend, per record patient has adequate p.o. intakes and compliant with nutrition shakes between meals, now with reports of orthostasis and blood pressure 80 over 50s this afternoon.  Reduce torsemide  to 10 mg daily, and encourage p.o. fluids. - Orthostatic vitals in AM ordered -1/25-26/25 wt fairly stable; monitor 1/27: Increasingly  low blood pressure, will DC torsemide  today and encourage p.o. fluids,  initiate 500 cc IV fluids at 50 cc/h today.  BMP in AM. 1-28: Weights have stabilized today.  Continue monitoring for 1 to 2 days, then consider resumption of torsemide  at lower dose. 1/29: Worsening orthostasis despite holding diuretic torsemide , no peripheral signs of fluid overload, although weights have normalized at this point.  DC spironolactone  25 mg daily and Cozaar  25 mg daily, monitor closely.  Encourage p.o. fluids. 1/30: Continued orthostasis, weights stable and euvolemic; add IVF as above 1-31: Blood pressure mildly improved with IV fluid, but continues to be orthostatic.  Continue for today, continue to emphasize to patient encourage of p.o. fluid intake as well.  DC Farxiga  per cardiology as above. 2-3: Blood pressures improved since DC Farxiga .  Continue current management.--Continues losing weight daily  Filed Weights   04/15/23 0500 04/16/23 0533 04/17/23 0611  Weight: 50.2 kg 49.7 kg 48.3 kg    14.  Sacral wound.  Wound care nurse follow-up.  Covered with foam dressing -1-16: Caught himself on the back during a toileting transfer today per nursing, resulting in small wound, covered in barrier dressing.  15.  Dysphagia/severe protein calorie malnutrition.  Dysphagia #2 thin liquids.   -Cortrak on intake; Continue supplemental tube feeds as above, encourage p.o. intakes, protein. -1-10: Eating 60 to 90% of meals per documentation.  Discontinue core track, tube feeds.--Continuing to eat well 1/28: P.o. intakes adequate per record, but cachectic appearance and continuing to lose weight.  Somewhat improved with adjustment of diuretics as above.  Will start mirtazapine  for appetite stimulation. 1-29: Dietary evaluated, continue current management with BID ensure shakes 1/30: Continues poor PO intakes despite considerable encouragement; unfortunately not tolerating mirtazepine 1-31: Family bringing in food that he likes today.  Encouraged consumption of protein shakes, Magic cups  between meals.   Has lost ~20 lbs since admission 1/8; some due to diuresis, eating 25-80% meals (avg 60%), continues to lose weight -  Nutrition onboard and assisting.    2-3: Weights continue to downtrend.  Increasing Ensure to 3 times daily between meals. DC Pepcid  due to patient refusal. Adding appetite stimulant marinol  2.5 mg BID today.   2-4: P.o. intakes appear improved since starting Marinol .  Did change Ensure to boost  breeze per patient preference.  16. Stool regimen: colace 200mg  daily  -1-17: DC liquid Colace, having regular bowel movements  -03/31/23 no BM in 3 days, will restart colace at 100mg  daily for now.  -04/01/23 pt states BM yesterday but none documented since 1/15, nurse documentation states LBM 1/16, unclear which is correct; asked nursing to please document if he's had BM today, if none today then would advance bowel regimen --1-20: Increase Colace to 100 mg twice daily, add Senokot S1 tab at night  -1/24 LBM  -04/07/23 no good BM since 1/21, just a smear on 1/24; hard palpable stool in lower abdomen, pt feels it in rectum; will do sorbitol  then mineral oil enema. Hopefully this improves it.  -04/08/23 multiple BMs yesterday with enema/meds, feels better; cont regimen -04/15/23 LBM 2d ago, cont regimen but if no BM by tomorrow, may want to add miralax  or increase senokot-- likely not eating enough to have daily BMs either...  2-3: Refusing stool softeners and Pepcid .  Move Senokot S to as needed  17. Thrombocytosis.  resolved   LOS: 28 days A FACE TO FACE EVALUATION WAS PERFORMED  Joesph JAYSON Likes 04/17/2023, 10:04 AM

## 2023-04-17 NOTE — Progress Notes (Signed)
 Speech Language Pathology Daily Session Note  Patient Details  Name: Walter Vargas MRN: [id number] Date of Birth: [birth year]  Today's Date: 04/17/2023 SLP Individual Time: 1350-1445 SLP Individual Time Calculation (min): 55 min  Short Term Goals: Week 4: SLP Short Term Goal 1 (Week 4): Patient will utilize external memory aids to recall daily information with 80% accuracy given mod assist. SLP Short Term Goal 2 (Week 4): Patient will demonstrate intellectual awareness of deficits as evidenced by stating 2 cognitive and 2 physical deficits with max assist. SLP Short Term Goal 3 (Week 4): Patient will solve basic environmental problems with 90% accuracy given mod assist.  Skilled Therapeutic Interventions: SLP conducted skilled therapy session targeting cognitive retraining goals. SLP facilitated word search task. SLP modified search field and requested patient locate single letters from field of ~20 letters. Patient benefited from max assist to consistently identify requested letters from modified field. Patient then engaged in functional environmental problem solving tasks re: changing TV channel and volume using provided channel guide. Patient required mod to max assist to change TV channel consistently and min assist to locate desired channel on guide. At end of session, patient self reports becoming distraught and notably became restless, moving things around and attempting to leave the room. Patient was easily redirected. Patient was left in chair with call bell in reach and chair alarm set. SLP will continue to target goals per plan of care.       Pain Pain Assessment Pain Scale: 0-10 Pain Score: 0-No pain  Therapy/Group: Individual Therapy  Takeesha Isley, M.A., CCC-SLP  Viviana Trimble A Daena Alper 04/17/2023, 4:22 PM

## 2023-04-18 ENCOUNTER — Inpatient Hospital Stay (HOSPITAL_COMMUNITY): Payer: 59

## 2023-04-18 DIAGNOSIS — I639 Cerebral infarction, unspecified: Secondary | ICD-10-CM

## 2023-04-18 MED ORDER — DIPHENHYDRAMINE HCL 25 MG PO CAPS
25.0000 mg | ORAL_CAPSULE | Freq: Three times a day (TID) | ORAL | Status: DC | PRN
  Administered 2023-04-18: 25 mg via ORAL
  Filled 2023-04-18: qty 1

## 2023-04-18 MED ORDER — POLYETHYLENE GLYCOL 3350 17 G PO PACK
17.0000 g | PACK | Freq: Every day | ORAL | Status: DC | PRN
  Administered 2023-04-18: 17 g via ORAL
  Filled 2023-04-18: qty 1

## 2023-04-18 NOTE — Progress Notes (Signed)
 Speech Language Pathology Daily Session Note  Patient Details  Name: KINSER FELLMAN MRN: [id number] Date of Birth: 07/19/1945  Today's Date: 04/18/2023 SLP Individual Time: 1431-1440 SLP Individual Time Calculation (min): 9 min  Short Term Goals: Week 4: SLP Short Term Goal 1 (Week 4): Patient will utilize external memory aids to recall daily information with 80% accuracy given mod assist. SLP Short Term Goal 2 (Week 4): Patient will demonstrate intellectual awareness of deficits as evidenced by stating 2 cognitive and 2 physical deficits with max assist. SLP Short Term Goal 3 (Week 4): Patient will solve basic environmental problems with 90% accuracy given mod assist.  Skilled Therapeutic Interventions: SLP conducted skilled therapy session targeting cognitive retraining goals. Upon SLP entry, patient endorsed that he would not be doing any puzzles today. When offered alternative speech therapy tasks, patient continued to refuse. SLP entered room and assisted patient with repositioning, acquisition of preferred items, and basic environmental problem solving tasks. Patient required max assist for all aforementioned tasks. SLP then left room to abide by patients wishes to refuse speech therapy this date. Patient was left in lowered bed with call bell in reach and bed alarm set. SLP will continue to target goals per plan of care.       Pain Pain Assessment Pain Scale: 0-10 Pain Score: 0-No pain  Therapy/Group: Individual Therapy  Janaia Kozel, M.A., CCC-SLP  Kashawna Manzer A Trinia Georgi 04/18/2023, 4:03 PM

## 2023-04-18 NOTE — Progress Notes (Signed)
 PROGRESS NOTE   Subjective/Complaints:  No acute complaints.  No events overnight. AM vitals with some mild diastolic hypotension, otherwise heart rate and blood pressure significantly improved.  Orthostatic vitals negative this a.m.  Lab workup yesterday significant for subclinical hypothyroidism, other parameters stable.  ROS: Denies fevers, chills,  abd pain, vomiting, diarrhea, SOB, cough, chest pain, new weakness or paraesthesias.    + anxiety--ongoing + Poor sleep -improving  + Orthostasis - improved  Objective:   No results found.   Recent Labs    04/16/23 0536  WBC 4.4  HGB 12.9*  HCT 39.8  PLT 198       Recent Labs    04/16/23 0536 04/17/23 1257  NA 133* 133*  K 4.2 4.7  CL 103 98  CO2 23 25  GLUCOSE 76 94  BUN 12 22  CREATININE 0.80 0.89  CALCIUM  8.7* 9.1      Intake/Output Summary (Last 24 hours) at 04/18/2023 0809 Last data filed at 04/17/2023 1849 Gross per 24 hour  Intake 236 ml  Output 250 ml  Net -14 ml      Pressure Injury 03/15/23 Coccyx Mid;Lower Stage 2 -  Partial thickness loss of dermis presenting as a shallow open injury with a red, pink wound bed without slough. (Active)  03/15/23 0800  Location: Coccyx  Location Orientation: Mid;Lower  Staging: Stage 2 -  Partial thickness loss of dermis presenting as a shallow open injury with a red, pink wound bed without slough.  Wound Description (Comments):   Present on Admission: Yes    Physical Exam: Vital Signs Blood pressure (!) 118/57, pulse (!) 55, temperature (!) 97.5 F (36.4 C), resp. rate 18, height 5' 8 (1.727 m), weight 49.2 kg, SpO2 100%.  Constitution: Appropriate appearance for age. No apparent distress. Cachectic appearance. HENT: MMM.  No deformities. Resp:   clear to auscultation bilaterally.  On room air, no respiratory distress. Cardio: Bradycardic rate, reg rhythm.   Chronic bilateral lower extremity skin  changes. No edema.  Capillary refill brisk. Abdomen: soft, Nondistended. Nontender,  +BS throughout, normoactive  Psych: Anxious  Skin:   + Right inner thigh/calf with scabbing from vein harvest site - healed + Peripheral IV, C/D/I + confluent rash on extensor surfaces bilateral upper extremities  MSK: Severe lumbar levoscoliosis.   Neurologic Exam:  Awake, alert, and oriented to self, place, and time + Moderate to severe memory and higher cognitive deficits  - ongoing.   Otherwise, neurologically intact.  No cranial nerve deficits.  Strength antigravity and against resistance equal throughout.  Sensation equal throughout.    Assessment/Plan: 1. Functional deficits which require 3+ hours per day of interdisciplinary therapy in a comprehensive inpatient rehab setting. Physiatrist is providing close team supervision and 24 hour management of active medical problems listed below. Physiatrist and rehab team continue to assess barriers to discharge/monitor patient progress toward functional and medical goals  Care Tool:  Bathing    Body parts bathed by patient: Right arm, Left arm, Chest, Abdomen, Front perineal area, Buttocks, Right upper leg, Left upper leg, Face, Right lower leg, Left lower leg   Body parts bathed by helper: Right lower leg, Left lower  leg     Bathing assist Assist Level: Supervision/Verbal cueing     Upper Body Dressing/Undressing Upper body dressing   What is the patient wearing?: Pull over shirt    Upper body assist Assist Level: Supervision/Verbal cueing    Lower Body Dressing/Undressing Lower body dressing      What is the patient wearing?: Pants, Underwear/pull up     Lower body assist Assist for lower body dressing: Minimal Assistance - Patient > 75%     Toileting Toileting    Toileting assist Assist for toileting: Minimal Assistance - Patient > 75%     Transfers Chair/bed transfer  Transfers assist     Chair/bed transfer assist  level: Contact Guard/Touching assist     Locomotion Ambulation   Ambulation assist      Assist level: Contact Guard/Touching assist Assistive device: Walker-rolling Max distance: 150'   Walk 10 feet activity   Assist  Walk 10 feet activity did not occur: Safety/medical concerns  Assist level: Contact Guard/Touching assist Assistive device: Walker-rolling   Walk 50 feet activity   Assist Walk 50 feet with 2 turns activity did not occur: Safety/medical concerns  Assist level: Contact Guard/Touching assist Assistive device: Walker-rolling    Walk 150 feet activity   Assist Walk 150 feet activity did not occur: Safety/medical concerns         Walk 10 feet on uneven surface  activity   Assist Walk 10 feet on uneven surfaces activity did not occur: Safety/medical concerns         Wheelchair     Assist Is the patient using a wheelchair?: Yes Type of Wheelchair: Manual Wheelchair activity did not occur:  (Sternal precautions)  Wheelchair assist level: Dependent - Patient 0% Max wheelchair distance: 150'    Wheelchair 50 feet with 2 turns activity    Assist    Wheelchair 50 feet with 2 turns activity did not occur: Safety/medical concerns       Wheelchair 150 feet activity     Assist  Wheelchair 150 feet activity did not occur: Safety/medical concerns       Blood pressure (!) 118/57, pulse (!) 55, temperature (!) 97.5 F (36.4 C), resp. rate 18, height 5' 8 (1.727 m), weight 49.2 kg, SpO2 100%.  Medical Problem List and Plan: 1. 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  Sternal precautions             -patient may shower -ELOS/Goals: 14-18 days supervision to mod I-- Placement/TBD  - 1/23: Per patient/family request, pursuing SNF placement due to inability of caregivers to provide 24/7 support with ongoing min assist ADLs and  cognitive deficits requiring assistance   -Stable to continue CIR 1/14: Needs to be independent at discharge per SW; son has moved out of state and can maybe provide 1-2 weeks on discharge. Patient's son's GF has provided some support in the hospital. Carryover very poor; Max-total assist with sternal precautions, Min A if able to push up. Difficulty with LBD and toiletting tasks - will forget what he is doing with toileting tasks. Per PT has been needing lots of help at home too. Per SW presenting like dementia cog-wise. Doing well with Dys 3. Will need assistance at discharge; SW to discuss with family.  1-16: Discussed patient dispo requirements with son [name], emphasizing need for full supervision at discharge and anticipating will be greater than a 1 to 2 months needed.  Did discuss how current  cognitive deficits per SLP discussions are more in line with mild dementia than delirium, but that this is not an official diagnosis.  After discussion with social work, family still unsure who will come in for training, will likely provide supervision for 2 weeks at home prior to deciding whether patient needs more oversight. 1/21: At bedtime incontinence per nursing. Per SW family not agreeing to care plan involving [name]/her mother, which was offered; this is consistent with patient's wishes as well. Unsure of dispo plan at this time as family is not offering alternatives.  1-22: Family discussed appealing discharge with social work due to feeling patient is not medically optimized.  Per team, primary barriers remain cognitive, not expected to improve further with additional time. 1/28: Per SLP, patient panicked with whole medications even though he complains of crushed; he prefers them to be dust. Geryl is coming next week. Upgraded to regular diet. Ongoing evening behaviors.  2/4: SW has been unable to contact [facility] since last week. Awaiting medicaid approval. SW called daughter yesterday, she is  skeptical that patient has cognitive deficits limiting independence. Per PT/OT supervision to CGA, with ongoing behavior issues. SLP supervision for swallowing - Mod A for basic tasks with SLP. Will make 15/7 due to limited gains.  2/5: attempted to call [facility] regarding update for MRI findings and psych consult; went to VM, message left that I will call back between 8-9 AM tomorrow  2.  Antithrombotics: -DVT/anticoagulation:  Pharmaceutical: Lovenox  40mg  daily.  Venous Doppler studies negative--intermittently refusing daily Lovenox . - ambulation of 450 ft with close SPV yesterday; DC lovenox  2/4  -antiplatelet therapy: Aspirin  81 mg daily.  3. Pain Management: Oxycodone  as needed--no complaints  4. Mood/Behavior/Sleep: Provide emotional support             - antipsychotic agents: Seroquel  25 mg daily at 1000 and 50 mg nightly   - Continue current regimen, can add melatonin 5 mg as needed  -1-13: Will start to wean off antipsychotics; DC AM Seroquel  for tomorrow 1-15: Patient tolerated well, change nightly Seroquel  to as needed--has resulted in somewhat interrupted sleep but so far has been tolerable.  Alyse Domino states he gets anxious at night sometimes regarding dispo issues. 1/17: No PRNs used.  Continue melatonin, will add nightly as needed trazodone  50 mg retime at bedtime medications to 8 PM given this is his normal bedtime--improved 1-20: Family reporting compulsive calls, anxieties in the early evenings between therapies and sleep.  Patient is sleeping well, no hallucinations or signs of delirium at this time.  Likely early sundowning, adding Atarax  for anxiety and informed nursing to be aware of these behaviors. 04-03-21: No reports by nursing overnight, no use of as needed Atarax  1-24: Increasing anxiety and confused behavior since family elected going to SNF; encouraged use of PRN Atarax , start BuSpar  5 mg twice daily 1-28: Adding mirtazapine  7.5 mg nightly for sleep, appetite  stimulation, and ongoing anxiety reported by staff. 1/29: DC buspar  due to no effect, addition of mirtazepine.  1/30: DC mirtazepine due to persistent orthostasis  1/31: Endorses he slept well overnight, continue current management -04/14/23 endorses poor sleep but isn't really sure; monitor for now 2-3: Refused melatonin overnight.  Moved to as needed  5. Neuropsych/cognition: This patient is not capable of making decisions on his own behalf.  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  Weaning sedating medications as above.  Will start Aricept  5 mg  nightly-theoretical risk of arrhythmia, however on literature review cardiac events matched placebo on meta-analysis. 1-15: Tolerating Aricept  well--and noticing some mild cognitive improvements 2-3: Psychiatry consulted for capacity evaluation, determined patient does not currently have capacity and cannot sign himself out AMA. Appreciate their insiight and assitance.  2-4: Ordering MRI brain, labs including TSH, folate, B12, thiamine  for neurologic contributors to cognitive deficits.  Low suspicion of stroke given no gross motor or sensory deficits.  Patient has as needed Xanax  if needed for MRI. 2-5: TSH elevated, other labs stable.  Parameters showing subclinical hypothyroidism, will defer treatment for now.  Awaiting MRI.--see below  6. Skin/Wound Care: Routine skin checks. -His right lower extremity harvest site completing course of Ancef  for cellulitis --1/8: transitioned to Augmentin  1-8 to complete 10-day course--appearance slightly more edematous/erythematous 1-14, will get WBC in a.m. and monitor closely, still staying within margins of prior marking so may just be taking a long time to resolve 1-13: DC PICC line--site looks clean. -1-17: Right clavicle staples removed.  Remove abdominal sutures today. - 1/21: RLE swelling/infection  resolved 1-22: Dry skin overlying prior infection site; add Eucerin daily--appearance improving 2/3: DC PIV   - 2/5: Confluent rash on BL UE this AM; add PRN benadryll   7. Fluids/Electrolytes/Nutrition/hyponatremia: Routine in and outs with follow-up chemistries, continue supplements.  -1-8: Low albumin , protein; continue current supplemental tube feeds as below.  Encourage protein and p.o. intakes.  Very mild hyponatremia, will recheck on Friday. 1-10: Hyponatremia 129, slight downtrend from 130.  Repeat BMP, serum osmol, urine studies in a.m. to confirm etiology--will be somewhat complicated by use of torsemide .  Placed on 1200 cc fluid restriction; patient is remaining well below this. -03/24/23 Na up to 132, SOsm WNL, urine studies not done yet -03/25/23 Urine Osm still not done... scheduled weekly labs starting Monday - 1/13: Na improved/stable - repeat Wednesday--add magnesium  to today's labs--2.1, stable 1-15: NA stable 131; add calcium  supplementation, PRN tums, and increase protonic to 40 mg BID for at bedtime nausea 1-17: Added as needed Zofran  4 mg every 8 hours, and miscellaneous nursing order to try and divide out medications and not give all at once crushed in applesauce due to intolerance of foul taste 1-20: Only 1 use of Zofran  over the weekend, no recorded emesis --still having nausea around crushed pills only -04/08/23 BMP done yesterday afternoon, Na 132 stable, BUN 47 and Cr 1.44 which is up-- encourage PO fluids today, recheck tomorrow; will hold off on starting fluids just yet.  1-27: Sodium remains downtrending 129, BUN and creatinine slightly improved.  P.o. intakes appear low Is.  Start 500 cc IV fluids today, repeat in AM. 1-28: Sodium improved to 131 with IV fluids and DC torsemide .  Continue regular monitoring of sodium 1-30: Na back down to 128; DC BP medications, DC mirtazepine, start IVF NS 50 cc/hr for 1 day. CMP in AM. Now off confounding meds will plan for urine  studies in AM to eval for SIADH 1-31: NA back to 131.  Unfortunately, still orthostatic, continue IV fluids.  Serum osmolality within normal limits, awaiting urine studies for full picture.-- UOsm 666 WNL, UNa 77--concentrating appropriately 2-3: Sodium improved 133 off of IV fluids.--Remaining stable 2-4  8.  Acute blood loss anemia.  Follow-up CBC -stable   - HgB improving/stable  9.  COVID-positive.  Completed Paxlovid   -1-17: DC schedule guaifenesin .  10.  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  Seen by vascular  surgery Dr Norman Serve and plan outpatient follow-up 1 month   - Likely contributor to acute/subacute   11.  History of VT peri-CABG.  Amiodarone  200 mg BID>>daily, Lanoxin  0.125 mg daily  -Remains with low/stable heart rate -1-14: Patient reporting irregular palpitations during therapies.  None documented, made PT aware so they can document any events during their therapies today.--did not occur per therapies 1/14 1-29: Adjusting blood pressure medications as below; may need to adjust amiodarone  to 100 mg daily if no improvement with adjustment of diuretics--ongoing 1-31: Reached out to cardiology regarding ongoing orthostasis with multiple recent medication adjustments.  They have recommended discontinuation of Farxiga , as this can cause orthostasis, also possible contributed to hyperkalemia.  Will do this today, continue other medications at current dose 2-3: Vitals appear improved.  Heart rate staying stable.   12.  Hyperlipidemia.  Lipitor  80mg  daily 13.  Diastolic congestive heart failure.  Torsemide  20 mg daily, Cozaar  25 mg daily, Aldactone  25 mg daily. Dapagliflozin  10mg  daily. Monitor for any signs of fluid overload.  Daily weights.  1-24: Weights with ongoing downtrend, per record patient has adequate p.o. intakes and compliant with nutrition shakes between meals, now with reports of orthostasis and blood pressure 80 over 50s  this afternoon.  Reduce torsemide  to 10 mg daily, and encourage p.o. fluids. - Orthostatic vitals in AM ordered -1/25-26/25 wt fairly stable; monitor 1/27: Increasingly  low blood pressure, will DC torsemide  today and encourage p.o. fluids, initiate 500 cc IV fluids at 50 cc/h today.  BMP in AM. 1-28: Weights have stabilized today.  Continue monitoring for 1 to 2 days, then consider resumption of torsemide  at lower dose. 1/29: Worsening orthostasis despite holding diuretic torsemide , no peripheral signs of fluid overload, although weights have normalized at this point.  DC spironolactone  25 mg daily and Cozaar  25 mg daily, monitor closely.  Encourage p.o. fluids. 1/30: Continued orthostasis, weights stable and euvolemic; add IVF as above 1-31: Blood pressure mildly improved with IV fluid, but continues to be orthostatic.  Continue for today, continue to emphasize to patient encourage of p.o. fluid intake as well.  DC Farxiga  per cardiology as above. 2-3: Blood pressures improved since DC Farxiga .  Continue current management.--Continues losing weight daily 2-5: Weight stable/up  Filed Weights   04/16/23 0533 04/17/23 0611 04/18/23 0600  Weight: 49.7 kg 48.3 kg 49.2 kg    14.  Sacral wound.  Wound care nurse follow-up.  Covered with foam dressing -1-16: Caught himself on the back during a toileting transfer today per nursing, resulting in small wound, covered in barrier dressing.  15.  Dysphagia/severe protein calorie malnutrition.  Dysphagia #2 thin liquids.   -Cortrak on intake; Continue supplemental tube feeds as above, encourage p.o. intakes, protein. -1-10: Eating 60 to 90% of meals per documentation.  Discontinue core track, tube feeds.--Continuing to eat well 1/28: P.o. intakes adequate per record, but cachectic appearance and continuing to lose weight.  Somewhat improved with adjustment of diuretics as above.  Will start mirtazapine  for appetite stimulation. 1-29: Dietary evaluated,  continue current management with BID ensure shakes 1/30: Continues poor PO intakes despite considerable encouragement; unfortunately not tolerating mirtazepine 1-31: Family bringing in food that he likes today.  Encouraged consumption of protein shakes, Magic cups between meals.   Has lost ~20 lbs since admission 1/8; some due to diuresis, eating 25-80% meals (avg 60%), continues to lose weight -  Nutrition onboard and assisting.    2-3: Weights continue to downtrend.  Increasing Ensure  to 3 times daily between meals. DC Pepcid  due to patient refusal. Adding appetite stimulant marinol  2.5 mg BID today.   2-4: P.o. intakes appear improved since starting Marinol .  Did change Ensure to boost breeze per patient preference. 2-5: Weight stable/up for the first time today.  Ate 100% of breakfast and 80% of dinner yesterday.  Albumin  is uptrending per CMP.  Hopeful to start making gains in this area  16. Stool regimen: colace 200mg  daily  -1-17: DC liquid Colace, having regular bowel movements  -03/31/23 no BM in 3 days, will restart colace at 100mg  daily for now.  -04/01/23 pt states BM yesterday but none documented since 1/15, nurse documentation states LBM 1/16, unclear which is correct; asked nursing to please document if he's had BM today, if none today then would advance bowel regimen --1-20: Increase Colace to 100 mg twice daily, add Senokot S1 tab at night  -1/24 LBM  -04/07/23 no good BM since 1/21, just a smear on 1/24; hard palpable stool in lower abdomen, pt feels it in rectum; will do sorbitol  then mineral oil enema. Hopefully this improves it.  -04/08/23 multiple BMs yesterday with enema/meds, feels better; cont regimen -04/15/23 LBM 2d ago, cont regimen but if no BM by tomorrow, may want to add miralax  or increase senokot-- likely not eating enough to have daily BMs either...  2-3: Refusing stool softeners and Pepcid .  Move Senokot S to as needed  2/5: Last bowel movement recorded 1-31; patient  reports bouts of diarrhea with protein shakes.  Will confirm with nursing today.  Given improved p.o. intakes, should start to pick up.  17. Thrombocytosis.  resolved  18. 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 Seen on MRI 2/5 for cognitive work up. Likely etiology large vessel dz (L ICA stenosis above + post-op hypotension).  - Neurology consulted, appreciate recs, patient without acute neuro changes this AM.  - Son Geryl called, reviewed results, will d/w patient in AM with him over the phone.   LOS: 29 days A FACE TO FACE EVALUATION WAS PERFORMED  Joesph JAYSON Likes 04/18/2023, 8:09 AM

## 2023-04-18 NOTE — Consult Note (Signed)
 NEUROLOGY CONSULT NOTE   Date of service: April 18, 2023 Patient Name: Walter Vargas MRN:  [id number] DOB:  February 23, 1946 Chief Complaint: stroke Requesting Provider: Emeline Joesph JAYSON, DO  History of Present Illness  Walter Vargas is a 78 y.o. male with a PMHx of HTN, former smoker, CAD, NSTEMI with acute systolic HFrEF,  ECHO 01/2023 showing EF < 20%, SOB who presented 11/30 to [facility] for with chest pain and SOB, was admitted and had left and right heart cath 12/3 with severe 3 vessel disease. He was then transferred to [facility] for surgical consult and possible CABG, placed on Heparin  drip.  CTH was completed due to new confusion, negative. CTA showed pulmonary edema and small pleural effusions.  12/19: Insertion of Impella device.  12/20: CABG x5 with post-op bleeding with re-exploration 12/30: Impella removed 12/25: Lovenox  started. Received 4u FF, 2u Cryo, 2 Platelets  Placed on Zosyn  due to suspected aspiration pneumonia  Cellulitis from right vein harvest site, Ancef  added 1/7: Admitted to rehab due to decreased functional mobility  Increased agitation and confusion on top of ongoing cognitive decline. Started on Seroquel  and PRN Atarax  and scheduled Buspar . Psych consulted, deemed that patient is make medical decisions and be able to leave AMA.   MRI was ordered due to continued cognition issues, which showed acute infarct in the left corona radiata and periatrial white matter. Neurology consulted.    ROS    Unable to ascertain due to AMS  Past History   Past Medical History:  Diagnosis Date   Essential hypertension    HFrEF (heart failure with reduced ejection fraction) (HCC)    Mitral regurgitation     Past Surgical History:  Procedure Laterality Date   CORONARY ARTERY BYPASS GRAFT N/A 03/01/2023   Procedure: CORONARY ARTERY BYPASS GRAFTING X 5, USING LEFT INTERNAL MAMMARY ARTERY AND ENDOSCOPICALLY HARVESTED RIGHT SAPHENOUS VEIN GRAFT;  Surgeon: Kerrin Elspeth JAYSON, MD;  Location: [facility] OR;  Service: Open Heart Surgery;  Laterality: N/A;   EXPLORATION POST OPERATIVE OPEN HEART N/A 03/01/2023   Procedure: EXPLORATION POST OPERATIVE OPEN HEART;  Surgeon: Kerrin Elspeth JAYSON, MD;  Location: [facility] OR;  Service: Open Heart Surgery;  Laterality: N/A;   NO PAST SURGERIES     PLACEMENT OF IMPELLA LEFT VENTRICULAR ASSIST DEVICE  03/01/2023   Procedure: PLACEMENT OF IMPELLA LEFT VENTRICULAR ASSIST DEVICE;  Surgeon: Kerrin Elspeth JAYSON, MD;  Location: [facility] OR;  Service: Open Heart Surgery;;   REMOVAL OF IMPELLA LEFT VENTRICULAR ASSIST DEVICE N/A 03/12/2023   Procedure: REMOVAL OF IMPELLA LEFT VENTRICULAR ASSIST DEVICE;  Surgeon: Kerrin Elspeth JAYSON, MD;  Location: [facility] OR;  Service: Open Heart Surgery;  Laterality: N/A;   RIGHT/LEFT HEART CATH AND CORONARY ANGIOGRAPHY N/A 02/13/2023   Procedure: RIGHT/LEFT HEART CATH AND CORONARY ANGIOGRAPHY;  Surgeon: Mady Bruckner, MD;  Location: [facility] INVASIVE CV LAB;  Service: Cardiovascular;  Laterality: N/A;   TEE WITHOUT CARDIOVERSION N/A 03/01/2023   Procedure: TRANSESOPHAGEAL ECHOCARDIOGRAM (TEE);  Surgeon: Kerrin Elspeth JAYSON, MD;  Location: [facility] OR;  Service: Open Heart Surgery;  Laterality: N/A;   TEE WITHOUT CARDIOVERSION N/A 03/12/2023   Procedure: TRANSESOPHAGEAL ECHOCARDIOGRAM (TEE);  Surgeon: Kerrin Elspeth JAYSON, MD;  Location: [facility] OR;  Service: Open Heart Surgery;  Laterality: N/A;    Family History: Family History  Problem Relation Age of Onset   Diabetes Mother        gestational diabetes   Hypertension Father    Alcohol abuse Father    Heart disease  Brother    Alcohol abuse Brother    Drug abuse Brother     Social History  reports that he quit smoking about 47 years ago. His smoking use included cigarettes. He started smoking about 57 years ago. He has a 5 pack-year smoking history. He has never used smokeless tobacco. He reports that he does not currently use alcohol. He reports that he does not use  drugs.  No Active Allergies  Medications   Current Facility-Administered Medications:    (feeding supplement) PROSource Plus liquid 30 mL, 30 mL, Oral, BID BM, Engler, Morgan C, DO, 30 mL at 04/18/23 1241   acetaminophen  (TYLENOL ) 160 MG/5ML solution 650 mg, 650 mg, Oral, Q4H PRN, Angiulli, Daniel J, PA-C   ALPRAZolam  (XANAX ) tablet 0.5 mg, 0.5 mg, Oral, Once PRN, Emeline Search C, DO   amiodarone  (PACERONE ) tablet 200 mg, 200 mg, Oral, Daily, Stoner, Benjamin J, MD, 200 mg at 04/18/23 0830   ascorbic acid  (VITAMIN C ) tablet 500 mg, 500 mg, Oral, Daily, Emeline Search C, DO, 500 mg at 04/18/23 0830   aspirin  chewable tablet 81 mg, 81 mg, Oral, Daily, Angiulli, Daniel J, PA-C, 81 mg at 04/18/23 9171   atorvastatin  (LIPITOR ) tablet 80 mg, 80 mg, Oral, Daily, Angiulli, Daniel J, PA-C, 80 mg at 04/18/23 0830   calcium  carbonate (TUMS - dosed in mg elemental calcium ) chewable tablet 200 mg of elemental calcium , 1 tablet, Oral, TID PRN, Emeline Search C, DO   digoxin  (LANOXIN ) tablet 0.125 mg, 0.125 mg, Oral, Daily, Angiulli, Daniel J, PA-C, 0.125 mg at 04/18/23 0830   docusate (COLACE) 50 MG/5ML liquid 100 mg, 100 mg, Oral, BID, Engler, Morgan C, DO, 100 mg at 04/18/23 9171   dronabinol  (MARINOL ) capsule 2.5 mg, 2.5 mg, Oral, BID AC, Engler, Morgan C, DO, 2.5 mg at 04/18/23 1241   feeding supplement (BOOST / RESOURCE BREEZE) liquid 1 Container, 1 Container, Oral, TID BM, Emeline Search BROCKS, DO, 1 Container at 04/18/23 1241   Gerhardt's butt cream, , Topical, Daily, Angiulli, Daniel J, PA-C, Given at 04/17/23 9073   hydrocerin (EUCERIN) cream, , Topical, Daily, Emeline Search BROCKS, DO, Given at 04/17/23 9073   melatonin tablet 5 mg, 5 mg, Oral, QHS PRN, Engler, Morgan C, DO   multivitamin with minerals tablet 1 tablet, 1 tablet, Oral, Daily, Emeline Search BROCKS, DO, 1 tablet at 04/18/23 9171   ondansetron  (ZOFRAN ) tablet 4 mg, 4 mg, Oral, Q8H PRN, Engler, Morgan C, DO, 4 mg at 04/04/23 9060   Oral care  mouth rinse, 15 mL, Mouth Rinse, 4 times per day, Angiulli, Toribio PARAS, PA-C, 15 mL at 04/18/23 0831   Oral care mouth rinse, 15 mL, Mouth Rinse, PRN, Angiulli, Daniel J, PA-C   polyethylene glycol (MIRALAX  / GLYCOLAX ) packet 17 g, 17 g, Oral, Daily PRN, Emeline Search C, DO, 17 g at 04/18/23 1442   senna-docusate (Senokot-S) tablet 1 tablet, 1 tablet, Oral, QHS PRN, Emeline Search BROCKS, DO   sodium chloride  (OCEAN) 0.65 % nasal spray 1 spray, 1 spray, Each Nare, PRN, Angiulli, Toribio PARAS, PA-C   thiamine  (VITAMIN B1) tablet 100 mg, 100 mg, Oral, Daily, Angiulli, Daniel J, PA-C, 100 mg at 04/18/23 0828  Vitals   Vitals:   04/18/23 0409 04/18/23 0600 04/18/23 1306 04/18/23 1309  BP: (!) 118/57  117/61 (!) 124/90  Pulse: (!) 55  65 (!) 59  Resp: 18  18   Temp: (!) 97.5 F (36.4 C)  98 F (36.7 C)   TempSrc:  SpO2: 100%  93% 99%  Weight:  49.2 kg    Height:        Body mass index is 16.49 kg/m.  Physical Exam   Constitutional: Frail, elderly,  Psych: restless, labile Eyes: No scleral injection.  HENT: No OP obstruction.  Head: Normocephalic.  Cardiovascular: Normal rate and regular rhythm.  Respiratory: Effort normal, non-labored breathing.  Skin: Multiple wounds noted  Neurologic Examination   Patient is drowsy but responsive to examiner. Says his name and that he is in the hospital. He follows simple commands only. Poor attention and concentration versus some receptive aphasia with some frustration seen. Decreased comprehension.   Does not cooperate with visual field testing. Does track examiner, blinks to threat bilaterally.   Hearing intact to voice. Facial movement seems symmetrical. Does not cooperate with tongue protrusion.   Moves all extremities spontaneously. Reduced fine motor movements. Sensation subjectively symmetrically intact.   Labs/Imaging/Neurodiagnostic studies   CBC:  Recent Labs  Lab 22-Apr-2023 0536  WBC 4.4  HGB 12.9*  HCT 39.8  MCV 87.9  PLT 198    Basic Metabolic Panel:  Lab Results  Component Value Date   NA 133 (L) 04/17/2023   K 4.7 04/17/2023   CO2 25 04/17/2023   GLUCOSE 94 04/17/2023   BUN 22 04/17/2023   CREATININE 0.89 04/17/2023   CALCIUM  9.1 04/17/2023   GFRNONAA >60 04/17/2023   Lipid Panel:  Lab Results  Component Value Date   LDLCALC 43 02/11/2023   HgbA1c:  Lab Results  Component Value Date   HGBA1C 5.9 (H) 02/28/2023   Urine Drug Screen: No results found for: LABOPIA, COCAINSCRNUR, LABBENZ, AMPHETMU, THCU, LABBARB  Alcohol Level     Component Value Date/Time   ETH <10 02/10/2023 1225   INR  Lab Results  Component Value Date   INR 1.6 (H) 03/03/2023   APTT  Lab Results  Component Value Date   APTT 48 (H) 03/02/2023    CT Head without contrast 11/30: No acute intracranial findings.  Chronic microvascular ischemic change and cerebral volume loss.   CT angio Head and Neck with contrast: PENDING  MRI Brain (Personally reviewed): Five separate subcentimeter foci of restricted diffusion within the left corona radiata and periatrial white matter consistent with acute infarcts. 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  Diffuse calcified plaque throughout RLE arterial system.  No significant areas of stenosis noted.  Left Anterior tibial artery appears occluded Diffuse calcific plaque noted throughout LLE No significant stenosis noted.    ASSESSMENT  Walter Vargas is a 78 y.o. male with hx of HTN, former smoker, CAD, NSTEMI with acute systolic HFrEF,   ECHO 01/2023 showing EF < 20%, SOB who presented 11/30 to [facility] for with chest pain and SOB, was admitted and had left and right heart cath 12/3 with severe 3 vessel disease, 12/20: CABG x5. Patient has had increased restlessness, agitation and continued cognitive decline. MRI obtained today showed acute infarcts in the left corona radiata. Patient will need stroke work-up. He is already on Aspirin , would discuss with Cardiology before adding any further antithrombotic/OAC medications.  Impression: Acute Ischemic Infarcts, likely embolic.   RECOMMENDATIONS  - Frequent Neuro checks per stroke unit protocol - Vascular imaging - CTA - TTE, just had one on 12/30, no need to repeat.  - Lipid panel completed 02/11/23 (no need to repeat): LDL 38  Currently on Lipitor  80mg  - HgbA1c completed 12/18 (no need to repeat) 5. - A1C - Antithrombotic - currently on Aspirin . Will need to discuss with Cardiology before adding DAPT.  - DVT ppx - SCDs - SBP goal - normotensive as we do not know last known well; patient is most likely outside of the permissive hypertension window. Avoid hypotension.  - Telemetry monitoring for arrhythmia - 72h - Swallow screen - will be performed prior to PO intake - Stroke education - will be given - PT/OT/SLP  - Stroke team will see patient starting 2/6 AM  ______________________________________________________________________  Pt seen by Neuro NP/APP and MD.     Signed, Rocky JAYSON Likes, DNP, AGACNP-BC [facility] Please use AMION for contact information & EPIC for messaging.  I have seen and examined the patient. I have reviewed the assessment and recommendations and made amendations as needed. 78 year old male with acute to subacute left corona radiata ischemic infarctions. On exam, the patient is drowsy but responsive to examiner; says his name and that he is in the hospital. He follows simple commands only. Poor attention and concentration versus some  receptive aphasia with some frustration confounding the assessment. Decreased comprehension. Does not cooperate with visual field testing; does track examiner, blinks to threat bilaterally.  Facial movement seems symmetrical. Does not cooperate with tongue protrusion. Moves all extremities spontaneously. Reduced fine motor movements. Sensation subjectively symmetrically intact. Recommendations as above.  Electronically signed: Dr. Rawn Quiroa

## 2023-04-18 NOTE — Progress Notes (Signed)
 Patient ID: Walter Vargas, male   DOB: July 06, 1945, 78 y.o.   MRN: [id number]  [id number] spoke with pt son [name] in length. SW reviewed all current barriers to discharge sharing the updates from psych eval, and SLP having met with him as well. SW reiterated the importance of him seeing it for himself. Confirms he will out here, and will be here on Friday; fam edu scheduled for Friday 10am-12pm; 1pm-2pm. His son does not think that a psych eval was needed, and feels like him having been here in the hospital is causing some of his confusion and disorientation. SW reviewed in length SNF placement and insurance approval process; and each  scenario with regard to short term rehab placement with insurance, without insurance (private pay up to 30 days per facility requirement), with LTC Medicaid approved application (takes up to 45 days) , and still concerns about no discharge plan being an issue for each facility. SW discussed the reason an APS referral is made, and with no discharge plan, we have to submit one. He reports he will have an answer for SW on Friday if they are willing to apply for guardianship or not, and if an APS referral will be needed.   SW remains concerned that if the family decides to discharge pt to home on Friday or over the weekend, that there will not be 24/7 care once the son returns back to [place], and the family friends are only able to provide PRN support to pt. An APS referral will be submitted at discharge.   *Medicaid application has been completed with financial navigator.   [name], MSW, LCSW Office: [phone] Cell: [phone] Fax: [phone]

## 2023-04-18 NOTE — Progress Notes (Signed)
 Occupational Therapy Session Note  Patient Details  Name: Walter Vargas MRN: [id number] Date of Birth: [birth year]  Session 1 Today's Date: 04/18/2023 OT Individual Time: 1102-1150 OT Individual Time Calculation (min): 48 min    Session 2  Today's Date: 04/18/2023 OT Missed Time: 30 Minutes Missed Time Reason: Patient unwilling/refused to participate without medical reason  Short Term Goals: Week 4:  OT Short Term Goal 1 (Week 4): Pt will complete toileting tasks with CGA OT Short Term Goal 2 (Week 4): Pt will complete LB dressing with CGA OT Short Term Goal 3 (Week 4): Pt will properly place UE during sit <> stands with external cueing  Skilled Therapeutic Interventions/Progress Updates:    Pt received supine with c/o discomfort in his side from needing to use the bathroom. He came to EOB with (S). He was more confused today in his transfers and required frequent sequencing cues for each step of toileting. He stood with the RW with a posterior bias, requiring min A to correct as he stood. He completed functional mobility with CGA but did require min A to position RW correctly. He attempted to sit down to use the bathroom without removing pants. When cued to remove pants, he did so but then attempted to sit again without pulling down underwear. He sat to attempt and void BM. He had a smear but no real void. Once he was standing following toileting, he stood with no initiation of next steps. OT stated wipe your bottom while holding a washcloth in clear view and pt used his bare hand to wipe his bottom. He had very little error recognition as OT corrected and provided pt a washcloth and then he was able to complete hygiene with CGA in standing. He began functional mobility back to bed despite neither underwear nor pants pulled up. Once again he required two separate cues for underwear and then pants being pulled up. Asked pt to wash his hands at the sink- he did so but then attempted to walk back  to his bed with wet hands. Stated dry your hands off and pt washed them again. After drying hands he returned to supine, very tired. Rest break provided. Pt perseverative with poor memory on RN providing miralax . Pt was left supine with all needs met, bed alarm set, and call bell within reach. 10 min missed d/t pt refusing to do anything else, still straining from constipation.   Session 2 Pt supine in bed, reporting he is tired and still constipated. He was actively straining while laying. RN-NT aware. He adamantly declined any participation. He was left supine with all needs met, bed alarm set. 30 min missed.    Therapy Documentation Precautions:  Precautions Precautions: Fall, Sternal, Other (comment) Precaution Booklet Issued: No Precaution Comments: cortrak, sternal precautions Restrictions Weight Bearing Restrictions Per Provider Order: No RUE Weight Bearing Per Provider Order: Weight bearing as tolerated LUE Weight Bearing Per Provider Order: Weight bearing as tolerated  Therapy/Group: Individual Therapy  [name] 04/18/2023, 6:17 AM

## 2023-04-18 NOTE — Progress Notes (Signed)
 Physical Therapy Session Note  Patient Details  Name: Walter Vargas MRN: [id number] Date of Birth: 1945/12/12  Today's Date: 04/18/2023 PT Missed Time: 60 Minutes Missed Time Reason: CT/MRI  Short Term Goals: Week 3:  PT Short Term Goal 1 (Week 3): STGs = LTGs  Skilled Therapeutic Interventions/Progress Updates:     Pt off floor for MRI. PT will follow up as able.   Therapy Documentation Precautions:  Precautions Precautions: Fall, Sternal, Other (comment) Precaution Booklet Issued: No Precaution Comments: cortrak, sternal precautions Restrictions Weight Bearing Restrictions Per Provider Order: No RUE Weight Bearing Per Provider Order: Weight bearing as tolerated LUE Weight Bearing Per Provider Order: Weight bearing as tolerated General: PT Amount of Missed Time (min): 60 Minutes PT Missed Treatment Reason: CT/MRI   Therapy/Group: Individual Therapy  Elsie JAYSON Dawn, PT, DPT 04/18/2023, 4:16 PM

## 2023-04-19 ENCOUNTER — Inpatient Hospital Stay (HOSPITAL_COMMUNITY): Payer: 59

## 2023-04-19 DIAGNOSIS — Z951 Presence of aortocoronary bypass graft: Secondary | ICD-10-CM

## 2023-04-19 DIAGNOSIS — I6522 Occlusion and stenosis of left carotid artery: Secondary | ICD-10-CM

## 2023-04-19 DIAGNOSIS — E43 Unspecified severe protein-calorie malnutrition: Secondary | ICD-10-CM

## 2023-04-19 DIAGNOSIS — I63412 Cerebral infarction due to embolism of left middle cerebral artery: Secondary | ICD-10-CM

## 2023-04-19 DIAGNOSIS — I63512 Cerebral infarction due to unspecified occlusion or stenosis of left middle cerebral artery: Secondary | ICD-10-CM

## 2023-04-19 LAB — VITAMIN B1: Vitamin B1 (Thiamine): 299.8 nmol/L — ABNORMAL HIGH (ref 66.5–200.0)

## 2023-04-19 MED ORDER — CLOPIDOGREL BISULFATE 75 MG PO TABS
75.0000 mg | ORAL_TABLET | Freq: Every day | ORAL | Status: DC
  Administered 2023-04-20: 75 mg via ORAL
  Filled 2023-04-19: qty 1

## 2023-04-19 MED ORDER — IOHEXOL 350 MG/ML SOLN
75.0000 mL | Freq: Once | INTRAVENOUS | Status: AC | PRN
  Administered 2023-04-19: 75 mL via INTRAVENOUS

## 2023-04-19 MED ORDER — CLOPIDOGREL BISULFATE 75 MG PO TABS
300.0000 mg | ORAL_TABLET | Freq: Once | ORAL | Status: AC
  Administered 2023-04-19: 300 mg via ORAL
  Filled 2023-04-19: qty 4

## 2023-04-19 NOTE — Progress Notes (Signed)
 PROGRESS NOTE   Subjective/Complaints:  No acute complaints.  No events overnight. Orthostatic vitals negative this a.m. Discussed with patient new MRI findings of stroke, had son [name] on the phone during conversation.  Patient was initially tearful, asked to go to bed, did not have any follow-up questions or concerns.  Patient reiterates priority is to go home Friday or Saturday, stated anticipate stroke workup will not throw off discharge date more than a few days and patient endorsing he would not be willing to stay an extra week at rehab.  Later, saw patient in therapy gym, discussed erythema of right hand and forearm and left hands.  He states this is something that happens from time to time, is not painful, does not itch, and goes away on its own.  He blames it on his [place] blood  ROS: Denies fevers, chills,  abd pain, vomiting, diarrhea, SOB, cough, chest pain, new weakness or paraesthesias.   + confusion, ongoing  + anxiety--ongoing + Poor sleep -improving  + Orthostasis - improved  Objective:   MR BRAIN WO CONTRAST Result Date: 04/18/2023 CLINICAL DATA:  Mental status change, persistent or worsening. Memory loss. EXAM: MRI HEAD WITHOUT CONTRAST TECHNIQUE: Multiplanar, multiecho pulse sequences of the brain and surrounding structures were obtained without intravenous contrast. COMPARISON:  CT head without contrast 02/10/2023. FINDINGS: Brain: Five separate subcentimeter foci of restricted diffusion are present within the left corona radiata and periatrial white matter. a 7 mm lesion in the posterior left corona radiata and demonstrates peripheral diffusion signal and central T2 signal, consistent with a subacute insult. Moderate confluent periventricular and scattered subcortical T2 hyperintensities are somewhat more prominent on the left. No acute infarct or hemorrhage is present. Deep brain nuclei are within normal  limits. The ventricles are proportionate to the degree of atrophy. No significant extraaxial fluid collection is present. The brainstem and cerebellum are within normal limits. Midline structures are within normal limits. Vascular: Evaluation of vessels is somewhat limited due to patient motion. Flow is present in the anterior circulation bilaterally although the left internal carotid artery may be narrowed. CT angiography may be useful for further evaluation. Skull and upper cervical spine: The craniocervical junction is normal. Upper cervical spine is within normal limits. Marrow signal is unremarkable. Sinuses/Orbits: The paranasal sinuses and mastoid air cells are clear. The globes and orbits are within normal limits. IMPRESSION: 1. Five separate subcentimeter foci of restricted diffusion within the left corona radiata and periatrial white matter consistent with acute infarcts. 2. 7 mm lesion in the posterior left corona radiata demonstrates peripheral diffusion signal and central T2 signal, consistent with a subacute insult. 3. Moderate confluent periventricular and scattered subcortical T2 hyperintensities bilaterally are somewhat more prominent on the left. This likely reflects the sequela of chronic microvascular ischemia. 4. Evaluation of vessels is somewhat limited due to patient motion. Flow is present in the anterior circulation bilaterally although the left internal carotid artery may be narrowed. CT angiography may be useful for further evaluation. These results will be called to the ordering clinician or representative by the Radiologist Assistant, and communication documented in the PACS or [employer]. Electronically Signed   By: Lonni Necessary  M.D.   On: 04/18/2023 13:40     No results for input(s): WBC, HGB, HCT, PLT in the last 72 hours.      Recent Labs    04/17/23 1257  NA 133*  K 4.7  CL 98  CO2 25  GLUCOSE 94  BUN 22  CREATININE 0.89  CALCIUM  9.1       Intake/Output Summary (Last 24 hours) at 04/19/2023 1340 Last data filed at 04/19/2023 1248 Gross per 24 hour  Intake 356 ml  Output --  Net 356 ml      Pressure Injury 03/15/23 Coccyx Mid;Lower Stage 2 -  Partial thickness loss of dermis presenting as a shallow open injury with a red, pink wound bed without slough. (Active)  03/15/23 0800  Location: Coccyx  Location Orientation: Mid;Lower  Staging: Stage 2 -  Partial thickness loss of dermis presenting as a shallow open injury with a red, pink wound bed without slough.  Wound Description (Comments):   Present on Admission: Yes    Physical Exam: Vital Signs Blood pressure (!) 108/58, pulse (!) 58, temperature 98 F (36.7 C), temperature source Oral, resp. rate 17, height 5' 8 (1.727 m), weight 49.2 kg, SpO2 100%.  Constitution: Appropriate appearance for age. No apparent distress. Cachectic appearance. HENT: MMM.  No deformities. Resp:   clear to auscultation bilaterally.  On room air, no respiratory distress. Cardio: Bradycardic rate, reg rhythm.   Chronic bilateral lower extremity skin changes. No edema.  Capillary refill mildly delayed in bilateral hands but remains less than 5 seconds.  Bilateral lower extremities brisk.  Peripheral pulses palpable, 2+.  Abdomen: soft, Nondistended. Nontender,  +BS throughout, normoactive  Psych: Anxious mood.  Skin:   + Right inner thigh/calf with scabbing from vein harvest site - healed + confluent erythema on extensor surfaces bilateral upper extremities--no urticaria, raised areas, pustules, or excoriations. + Bilateral upper extremity bruising, unchanged  MSK: Severe lumbar levoscoliosis.  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  Neurologic Exam:  Awake, alert, and oriented to self, place; not oriented to time +  severe memory and higher cognitive deficits  - ongoing.    No cranial nerve deficits.   Right upper extremity 5 -  throughout, left upper extremity 5 out of 5 throughout, bilateral lower extremities 5 out of 5 throughout. Station intact throughout. Slight right upper extremity ataxia on finger-to-nose.   Assessment/Plan: 1. Functional deficits which require 3+ hours per day of interdisciplinary therapy in a comprehensive inpatient rehab setting. Physiatrist is providing close team supervision and 24 hour management of active medical problems listed below. Physiatrist and rehab team continue to assess barriers to discharge/monitor patient progress toward functional and medical goals  Care Tool:  Bathing    Body parts bathed by patient: Right arm, Left arm, Chest, Abdomen, Front perineal area, Buttocks, Right upper leg, Left upper leg, Face, Right lower leg, Left lower leg   Body parts bathed by helper: Right lower leg, Left lower leg     Bathing assist Assist Level: Supervision/Verbal cueing     Upper Body Dressing/Undressing Upper body dressing   What is the patient wearing?: Pull over shirt    Upper body assist Assist Level: Supervision/Verbal cueing    Lower Body Dressing/Undressing Lower body dressing      What is the patient wearing?: Pants, Underwear/pull up     Lower body assist Assist for lower body dressing: Minimal Assistance - Patient > 75%  Toileting Toileting    Toileting assist Assist for toileting: Minimal Assistance - Patient > 75%     Transfers Chair/bed transfer  Transfers assist     Chair/bed transfer assist level: Contact Guard/Touching assist     Locomotion Ambulation   Ambulation assist      Assist level: Contact Guard/Touching assist Assistive device: Walker-rolling Max distance: 150'   Walk 10 feet activity   Assist  Walk 10 feet activity did not occur: Safety/medical concerns  Assist level: Contact Guard/Touching assist Assistive device: Walker-rolling   Walk 50 feet activity   Assist Walk 50 feet with 2 turns activity did not  occur: Safety/medical concerns  Assist level: Contact Guard/Touching assist Assistive device: Walker-rolling    Walk 150 feet activity   Assist Walk 150 feet activity did not occur: Safety/medical concerns         Walk 10 feet on uneven surface  activity   Assist Walk 10 feet on uneven surfaces activity did not occur: Safety/medical concerns         Wheelchair     Assist Is the patient using a wheelchair?: Yes Type of Wheelchair: Manual Wheelchair activity did not occur:  (Sternal precautions)  Wheelchair assist level: Dependent - Patient 0% Max wheelchair distance: 150'    Wheelchair 50 feet with 2 turns activity    Assist    Wheelchair 50 feet with 2 turns activity did not occur: Safety/medical concerns       Wheelchair 150 feet activity     Assist  Wheelchair 150 feet activity did not occur: Safety/medical concerns       Blood pressure (!) 108/58, pulse (!) 58, temperature 98 F (36.7 C), temperature source Oral, resp. rate 17, height 5' 8 (1.727 m), weight 49.2 kg, SpO2 100%.  Medical Problem List and Plan: 1. 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  Sternal precautions             -patient may shower -ELOS/Goals: 14-18 days supervision to mod I-- Placement/TBD  - 1/23: Per patient/family request, pursuing SNF placement due to inability of caregivers to provide 24/7 support with ongoing min assist ADLs and cognitive deficits requiring assistance   -Stable to continue CIR 1/14: Needs to be independent at discharge per SW; son has moved out of state and can maybe provide 1-2 weeks on discharge. Patient's son's GF has provided some support in the hospital. Carryover very poor; Max-total assist with sternal precautions, Min A if able to push up. Difficulty with LBD and toiletting tasks - will forget what he is doing with toileting tasks. Per PT  has been needing lots of help at home too. Per SW presenting like dementia cog-wise. Doing well with Dys 3. Will need assistance at discharge; SW to discuss with family.  1-16: Discussed patient dispo requirements with son [name], emphasizing need for full supervision at discharge and anticipating will be greater than a 1 to 2 months needed.  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  After discussion with social work, family still unsure who will come in for training, will likely provide supervision for 2 weeks at home prior to deciding whether patient needs more oversight. 1/21: At bedtime incontinence per nursing. Per SW family not agreeing to care plan involving [name]/her mother, which was offered; this is consistent with patient's wishes as well. Unsure  of dispo plan at this time as family is not offering alternatives.  1-22: Family discussed appealing discharge with social work due to feeling patient is not medically optimized.  Per team, primary barriers remain cognitive, not expected to improve further with additional time. 1/28: Per SLP, patient panicked with whole medications even though he complains of crushed; he prefers them to be dust. Geryl is coming next week. Upgraded to regular diet. Ongoing evening behaviors.  2/4: SW has been unable to contact [place] since last week. Awaiting medicaid approval. SW called daughter yesterday, she is skeptical that patient has cognitive deficits limiting independence. Per PT/OT supervision to CGA, with ongoing behavior issues. SLP supervision for swallowing - Mod A for basic tasks with SLP. Will make 15/7 due to limited gains.  2/5: attempted to call Geryl regarding update for MRI findings and psych consult; went to VM, message left that I will call back between 8-9 AM tomorrow 2-6: Discussed with [name], patient findings on MRI consistent  with subacute and acute strokes.  Undergoing neurology and vascular workup for possible interventions.  Medical hold on discharge until workup complete.  2.  Antithrombotics: -DVT/anticoagulation:  Pharmaceutical: Lovenox  40mg  daily.  Venous Doppler studies negative--intermittently refusing daily Lovenox . - ambulation of 450 ft with close SPV yesterday; DC lovenox  2/4  -2-6: Starting DAPT per neurology/vascular due to stroke.  Pending echocardiogram, may need DOAC.   3. Pain Management: Oxycodone  as needed--no complaints  4. Mood/Behavior/Sleep: Provide emotional support             - antipsychotic agents: Seroquel  25 mg daily at 1000 and 50 mg nightly   - Continue current regimen, can add melatonin 5 mg as needed  -1-13: Will start to wean off antipsychotics; DC AM Seroquel  for tomorrow 1-15: Patient tolerated well, change nightly Seroquel  to as needed--has resulted in somewhat interrupted sleep but so far has been tolerable.  Alyse Domino states he gets anxious at night sometimes regarding dispo issues. 1/17: No PRNs used.  Continue melatonin, will add nightly as needed trazodone  50 mg retime at bedtime medications to 8 PM given this is his normal bedtime--improved 1-20: Family reporting compulsive calls, anxieties in the early evenings between therapies and sleep.  Patient is sleeping well, no hallucinations or signs of delirium at this time.  Likely early sundowning, adding Atarax  for anxiety and informed nursing to be aware of these behaviors. 04-03-21: No reports by nursing overnight, no use of as needed Atarax  1-24: Increasing anxiety and confused behavior since family elected going to SNF; encouraged use of PRN Atarax , start BuSpar  5 mg twice daily 1-28: Adding mirtazapine  7.5 mg nightly for sleep, appetite stimulation, and ongoing anxiety reported by staff. 1/29: DC buspar  due to no effect, addition of mirtazepine.  1/30: DC mirtazepine due to persistent orthostasis  1/31: Endorses he  slept well overnight, continue current management -04/14/23 endorses poor sleep but isn't really sure; monitor for now 2-3: Refused melatonin overnight.  Moved to as needed  5. Neuropsych/cognition: This patient is not capable of making decisions on his own behalf.  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  Weaning sedating medications as above.  Will start Aricept  5 mg nightly-theoretical risk of arrhythmia, however on literature review cardiac events matched placebo on meta-analysis. 1-15: Tolerating Aricept  well--and noticing some mild cognitive improvements 2-3: Psychiatry consulted for capacity evaluation, determined patient does not currently have capacity and cannot sign himself out AMA. Appreciate  their insiight and assitance.  2-4: Ordering MRI brain, labs including TSH, folate, B12, thiamine  for neurologic contributors to cognitive deficits.  Low suspicion of stroke given no gross motor or sensory deficits.  Patient has as needed Xanax  if needed for MRI. 2-5: TSH elevated, other labs stable.  Parameters showing subclinical hypothyroidism, will defer treatment for now.  MRI showing left corona radiata acute and subacute strokes.  See below.  6. Skin/Wound Care: Routine skin checks. -His right lower extremity harvest site completing course of Ancef  for cellulitis --1/8: transitioned to Augmentin  1-8 to complete 10-day course--appearance slightly more edematous/erythematous 1-14, will get WBC in a.m. and monitor closely, still staying within margins of prior marking so may just be taking a long time to resolve 1-13: DC PICC line--site looks clean. -1-17: Right clavicle staples removed.  Remove abdominal sutures today. - 1/21: RLE swelling/infection resolved 1-22: Dry skin overlying prior infection site; add Eucerin daily--appearance improving 2/3: DC PIV   - 2/5: Confluent rash on BL UE this  AM; add PRN benadryll - 2-6: Ongoing bilateral upper extremity erythema, nonpainful, does not look like a rash; mildly delayed capillary refill with good peripheral pulses, ?raynaud's per patient self endorsed history of similar events popping up and going within a few days. Continue to monitor.    7. Fluids/Electrolytes/Nutrition/hyponatremia: Routine in and outs with follow-up chemistries, continue supplements.  -1-8: Low albumin , protein; continue current supplemental tube feeds as below.  Encourage protein and p.o. intakes.  Very mild hyponatremia, will recheck on Friday. 1-10: Hyponatremia 129, slight downtrend from 130.  Repeat BMP, serum osmol, urine studies in a.m. to confirm etiology--will be somewhat complicated by use of torsemide .  Placed on 1200 cc fluid restriction; patient is remaining well below this. -03/24/23 Na up to 132, SOsm WNL, urine studies not done yet -03/25/23 Urine Osm still not done... scheduled weekly labs starting Monday - 1/13: Na improved/stable - repeat Wednesday--add magnesium  to today's labs--2.1, stable 1-15: NA stable 131; add calcium  supplementation, PRN tums, and increase protonic to 40 mg BID for at bedtime nausea 1-17: Added as needed Zofran  4 mg every 8 hours, and miscellaneous nursing order to try and divide out medications and not give all at once crushed in applesauce due to intolerance of foul taste 1-20: Only 1 use of Zofran  over the weekend, no recorded emesis --still having nausea around crushed pills only -04/08/23 BMP done yesterday afternoon, Na 132 stable, BUN 47 and Cr 1.44 which is up-- encourage PO fluids today, recheck tomorrow; will hold off on starting fluids just yet.  1-27: Sodium remains downtrending 129, BUN and creatinine slightly improved.  P.o. intakes appear low Is.  Start 500 cc IV fluids today, repeat in AM. 1-28: Sodium improved to 131 with IV fluids and DC torsemide .  Continue regular monitoring of sodium 1-30: Na back down to 128;  DC BP medications, DC mirtazepine, start IVF NS 50 cc/hr for 1 day. CMP in AM. Now off confounding meds will plan for urine studies in AM to eval for SIADH 1-31: NA back to 131.  Unfortunately, still orthostatic, continue IV fluids.  Serum osmolality within normal limits, awaiting urine studies for full picture.-- UOsm 666 WNL, UNa 77--concentrating appropriately 2-3: Sodium improved 133 off of IV fluids.--Remaining stable 2-4  8.  Acute blood loss anemia.  Follow-up CBC -stable   - HgB improving/stable  9.  COVID-positive.  Completed Paxlovid   -1-17: DC schedule guaifenesin .  10.  Carotid artery stenosis.  Pre-CABG Dopplers with 80  to 99% left ICA stenosis.  Seen by vascular surgery Dr Norman Serve and plan outpatient follow-up 1 month   - Likely contributor to acute/subacute   11.  History of VT peri-CABG.  Amiodarone  200 mg BID>>daily, Lanoxin  0.125 mg daily  -Remains with low/stable heart rate -1-14: Patient reporting irregular palpitations during therapies.  None documented, made PT aware so they can document any events during their therapies today.--did not occur per therapies 1/14 1-29: Adjusting blood pressure medications as below; may need to adjust amiodarone  to 100 mg daily if no improvement with adjustment of diuretics--ongoing 1-31: Reached out to cardiology regarding ongoing orthostasis with multiple recent medication adjustments.  They have recommended discontinuation of Farxiga , as this can cause orthostasis, also possible contributed to hyperkalemia.  Will do this today, continue other medications at current dose 2-3: Vitals appear improved.  Heart rate staying stable.   12.  Hyperlipidemia.  Lipitor  80mg  daily 13.  Diastolic congestive heart failure.  Torsemide  20 mg daily, Cozaar  25 mg daily, Aldactone  25 mg daily. Dapagliflozin  10mg  daily. Monitor for any signs of fluid overload.  Daily weights.  1-24: Weights with ongoing downtrend, per record patient has adequate p.o.  intakes and compliant with nutrition shakes between meals, now with reports of orthostasis and blood pressure 80 over 50s this afternoon.  Reduce torsemide  to 10 mg daily, and encourage p.o. fluids. - Orthostatic vitals in AM ordered -1/25-26/25 wt fairly stable; monitor 1/27: Increasingly  low blood pressure, will DC torsemide  today and encourage p.o. fluids, initiate 500 cc IV fluids at 50 cc/h today.  BMP in AM. 1-28: Weights have stabilized today.  Continue monitoring for 1 to 2 days, then consider resumption of torsemide  at lower dose. 1/29: Worsening orthostasis despite holding diuretic torsemide , no peripheral signs of fluid overload, although weights have normalized at this point.  DC spironolactone  25 mg daily and Cozaar  25 mg daily, monitor closely.  Encourage p.o. fluids. 1/30: Continued orthostasis, weights stable and euvolemic; add IVF as above 1-31: Blood pressure mildly improved with IV fluid, but continues to be orthostatic.  Continue for today, continue to emphasize to patient encourage of p.o. fluid intake as well.  DC Farxiga  per cardiology as above. 2-3: Blood pressures improved since DC Farxiga .  Continue current management.--Continues losing weight daily 2-5: Weight stable/up 2-6: Weight stabilizing, blood pressure improving.  Due to recent strokes, will allow some higher blood pressures to maintain perfusion.  Still remains normovolemic.  Getting echocardiogram today.  Filed Weights   04/16/23 0533 04/17/23 0611 04/18/23 0600  Weight: 49.7 kg 48.3 kg 49.2 kg    14.  Sacral wound.  Wound care nurse follow-up.  Covered with foam dressing -1-16: Caught himself on the back during a toileting transfer today per nursing, resulting in small wound, covered in barrier dressing.  15.  Dysphagia/severe protein calorie malnutrition.  Dysphagia #2 thin liquids.   -Cortrak on intake; Continue supplemental tube feeds as above, encourage p.o. intakes, protein. -1-10: Eating 60 to 90% of  meals per documentation.  Discontinue core track, tube feeds.--Continuing to eat well 1/28: P.o. intakes adequate per record, but cachectic appearance and continuing to lose weight.  Somewhat improved with adjustment of diuretics as above.  Will start mirtazapine  for appetite stimulation. 1-29: Dietary evaluated, continue current management with BID ensure shakes 1/30: Continues poor PO intakes despite considerable encouragement; unfortunately not tolerating mirtazepine 1-31: Family bringing in food that he likes today.  Encouraged consumption of protein shakes, Magic cups between meals.  Has lost ~20 lbs since admission 1/8; some due to diuresis, eating 25-80% meals (avg 60%), continues to lose weight -  Nutrition onboard and assisting.    2-3: Weights continue to downtrend.  Increasing Ensure to 3 times daily between meals. DC Pepcid  due to patient refusal. Adding appetite stimulant marinol  2.5 mg BID today.   2-4: P.o. intakes appear improved since starting Marinol .  Did change Ensure to boost breeze per patient preference. 2-5: Weight stable/up for the first time today.  Ate 100% of breakfast and 80% of dinner yesterday.  Albumin  is uptrending per CMP.  Hopeful to start making gains in this area  2-6: Weights uptrending, eating better.  DC Marinol  due to slightly worsening confusion of the last few days.  16. Stool regimen: colace 200mg  daily  -1-17: DC liquid Colace, having regular bowel movements  -03/31/23 no BM in 3 days, will restart colace at 100mg  daily for now.  -04/01/23 pt states BM yesterday but none documented since 1/15, nurse documentation states LBM 1/16, unclear which is correct; asked nursing to please document if he's had BM today, if none today then would advance bowel regimen --1-20: Increase Colace to 100 mg twice daily, add Senokot S1 tab at night  -1/24 LBM  -04/07/23 no good BM since 1/21, just a smear on 1/24; hard palpable stool in lower abdomen, pt feels it in rectum;  will do sorbitol  then mineral oil enema. Hopefully this improves it.  -04/08/23 multiple BMs yesterday with enema/meds, feels better; cont regimen -04/15/23 LBM 2d ago, cont regimen but if no BM by tomorrow, may want to add miralax  or increase senokot-- likely not eating enough to have daily BMs either...  2-3: Refusing stool softeners and Pepcid .  Move Senokot S to as needed  2/5: Last bowel movement recorded 1-31; patient reports bouts of diarrhea with protein shakes.  Will confirm with nursing today.  Given improved p.o. intakes, should start to pick up.  2-6: Medium bowel movement this a.m.  17. Thrombocytosis.  resolved  18. 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 Seen on MRI 2/5 for cognitive work up. Likely etiology large vessel dz (L ICA stenosis above + post-op hypotension).  - Neurology consulted, appreciate recs, patient without acute neuro changes this AM.  - Son Geryl called, reviewed results, will d/w patient in AM with him over the phone.  2-6: Discussed findings with patient and his son [name] this a.m.  All questions answered.  Neurology and vascular surgery evaluating, getting echocardiogram and CTA today.  May need CEA more urgently than prior planned 1 month.  Appreciate neurology assistance and recommendations. -Appreciating some slight weakness in right upper extremity and ataxia, which has not been seen on prior exams.  Cognition remains poor, but no other gross deficits.  LOS: 30 days A FACE TO FACE EVALUATION WAS PERFORMED  Joesph JAYSON Likes 04/19/2023, 1:40 PM

## 2023-04-19 NOTE — Progress Notes (Addendum)
 Patient ID: Walter Vargas, male   DOB: 1946/02/15, 78 y.o.   MRN: [id number]  1615- SW left message for [place] informing on changes in therapy schedule in which his therapies will begin  at 9am due to scheduling conflicts. SW encouraged follow-up if needed, and reiterated being present for 930am SLP session.   *SW received updates from Walter Vargas/financial navigator reporting Medicaid application has been submitted.   Walter Vargas, MSW, LCSW Office: [phone] Cell: [phone] Fax: [phone]

## 2023-04-19 NOTE — Consult Note (Addendum)
 Hospital Consult    Reason for Consult:  symptomatic L ICA stenosis Requesting Physician:  Dr. Jerri MRN #:  [id number]  History of Present Illness: This is a 78 y.o. male with past medical history significant for hypertension, former smoking, CAD, CHF.  He suffered NSTEMI requiring Impella.  He underwent CABG and was discharged to inpatient rehabilitation postoperatively.  He has been experiencing increased agitation and confusion on top of cognitive decline.  Workup included MRI brain demonstrating left-sided acute infarcts.  Preoperatively carotid duplex demonstrated 80 to 99% stenosis of the left ICA.  He is now on dual antiplatelet therapy.  On exam he is unaware of stroke diagnosis.  He denies any one-sided weakness.  He is alert but somewhat confused.  No family at the bedside.  Past Medical History:  Diagnosis Date   Essential hypertension    HFrEF (heart failure with reduced ejection fraction) (HCC)    Mitral regurgitation     Past Surgical History:  Procedure Laterality Date   CORONARY ARTERY BYPASS GRAFT N/A 03/01/2023   Procedure: CORONARY ARTERY BYPASS GRAFTING X 5, USING LEFT INTERNAL MAMMARY ARTERY AND ENDOSCOPICALLY HARVESTED RIGHT SAPHENOUS VEIN GRAFT;  Surgeon: Kerrin Elspeth BROCKS, MD;  Location: [facility] OR;  Service: Open Heart Surgery;  Laterality: N/A;   EXPLORATION POST OPERATIVE OPEN HEART N/A 03/01/2023   Procedure: EXPLORATION POST OPERATIVE OPEN HEART;  Surgeon: Kerrin Elspeth BROCKS, MD;  Location: [facility] OR;  Service: Open Heart Surgery;  Laterality: N/A;   NO PAST SURGERIES     PLACEMENT OF IMPELLA LEFT VENTRICULAR ASSIST DEVICE  03/01/2023   Procedure: PLACEMENT OF IMPELLA LEFT VENTRICULAR ASSIST DEVICE;  Surgeon: Kerrin Elspeth BROCKS, MD;  Location: [facility] OR;  Service: Open Heart Surgery;;   REMOVAL OF IMPELLA LEFT VENTRICULAR ASSIST DEVICE N/A 03/12/2023   Procedure: REMOVAL OF IMPELLA LEFT VENTRICULAR ASSIST DEVICE;  Surgeon: Kerrin Elspeth BROCKS, MD;  Location: [facility]  OR;  Service: Open Heart Surgery;  Laterality: N/A;   RIGHT/LEFT HEART CATH AND CORONARY ANGIOGRAPHY N/A 02/13/2023   Procedure: RIGHT/LEFT HEART CATH AND CORONARY ANGIOGRAPHY;  Surgeon: Mady Bruckner, MD;  Location: [facility] INVASIVE CV LAB;  Service: Cardiovascular;  Laterality: N/A;   TEE WITHOUT CARDIOVERSION N/A 03/01/2023   Procedure: TRANSESOPHAGEAL ECHOCARDIOGRAM (TEE);  Surgeon: Kerrin Elspeth BROCKS, MD;  Location: [facility] OR;  Service: Open Heart Surgery;  Laterality: N/A;   TEE WITHOUT CARDIOVERSION N/A 03/12/2023   Procedure: TRANSESOPHAGEAL ECHOCARDIOGRAM (TEE);  Surgeon: Kerrin Elspeth BROCKS, MD;  Location: [facility] OR;  Service: Open Heart Surgery;  Laterality: N/A;    No Active Allergies  Prior to Admission medications   Medication Sig Start Date End Date Taking? Authorizing Provider  acetaminophen  (TYLENOL ) 160 MG/5ML solution Take 20.3 mLs (650 mg total) by mouth every 4 (four) hours as needed for fever, headache or mild pain (pain score 1-3). 03/20/23   Barrett, Erin R, PA-C  amiodarone  (PACERONE ) 200 MG tablet Place 1 tablet (200 mg total) into feeding tube 2 (two) times daily. X 7 days, then decrease to 200 mg daily 03/20/23   Barrett, Erin R, PA-C  amoxicillin -clavulanate (AUGMENTIN ) 600-42.9 MG/5ML suspension Take 5 mLs (600 mg total) by mouth 2 (two) times daily. X 7 days 03/20/23   Barrett, Rocky SAUNDERS, PA-C  ascorbic acid  (VITAMIN C ) 500 MG tablet Place 1 tablet (500 mg total) into feeding tube daily. 03/21/23   Barrett, Rocky SAUNDERS, PA-C  aspirin  EC 81 MG tablet Take 81 mg by mouth daily.    [provider]  atorvastatin  (  LIPITOR ) 80 MG tablet Take 1 tablet (80 mg total) by mouth daily. Patient not taking: Reported on 02/14/2023 02/15/23   Lenon Marien CROME, MD  digoxin  (LANOXIN ) 0.125 MG tablet Place 1 tablet (0.125 mg total) into feeding tube daily. 03/21/23   Barrett, Erin R, PA-C  empagliflozin  (JARDIANCE ) 10 MG TABS tablet Take 1 tablet (10 mg total) by mouth daily. 03/20/23   Barrett,  Erin R, PA-C  feeding supplement (ENSURE ENLIVE / ENSURE PLUS) LIQD Take 237 mLs by mouth 3 (three) times daily between meals. 03/20/23   Barrett, Erin R, PA-C  furosemide  (LASIX ) 20 MG tablet Take 20 mg by mouth daily. Patient not taking: Reported on 02/14/2023    [provider]  guaiFENesin  (ROBITUSSIN) 100 MG/5ML liquid Place 15 mLs into feeding tube every 6 (six) hours. 03/20/23   Barrett, Erin R, PA-C  losartan  (COZAAR ) 25 MG tablet Take 1 tablet (25 mg total) by mouth daily. 03/21/23   Barrett, Erin R, PA-C  metoprolol  succinate (TOPROL -XL) 25 MG 24 hr tablet Take 25 mg by mouth daily.    [provider]  Mouthwashes (MOUTH RINSE) LIQD solution 15 mLs by Mouth Rinse route 4 (four) times daily. 03/20/23   Barrett, Erin R, PA-C  Multiple Vitamins-Minerals (MULTIVITAMIN MEN 50+) TABS Take 1 tablet by mouth daily.    [provider]  Nutritional Supplements (FEEDING SUPPLEMENT, VITAL 1.5 CAL,) LIQD Place 660 mLs into feeding tube daily. 03/20/23   Barrett, Erin R, PA-C  Nystatin (GERHARDT'S BUTT CREAM) CREA Apply 1 Application topically daily. 03/21/23   Barrett, Erin R, PA-C  oxyCODONE  (ROXICODONE ) 5 MG/5ML solution Take 5 mLs (5 mg total) by mouth every 4 (four) hours as needed for severe pain (pain score 7-10) or moderate pain (pain score 4-6). 03/20/23   Barrett, Erin R, PA-C  Protein (FEEDING SUPPLEMENT, PROSOURCE TF20,) liquid Place 60 mLs into feeding tube daily. 03/21/23   Barrett, Erin R, PA-C  QUEtiapine  (SEROQUEL ) 25 MG tablet Place 1 tablet (25 mg total) into feeding tube daily. 03/21/23   Barrett, Erin R, PA-C  QUEtiapine  (SEROQUEL ) 50 MG tablet Take 1 tablet (50 mg total) by mouth at bedtime. 03/20/23   Barrett, Erin R, PA-C  sodium chloride  (OCEAN) 0.65 % SOLN nasal spray Place 1 spray into both nostrils as needed for congestion. 03/20/23   Barrett, Erin R, PA-C  spironolactone  (ALDACTONE ) 25 MG tablet Take 1 tablet (25 mg total) by mouth daily. 03/21/23   Barrett, Erin R, PA-C   thiamine  (VITAMIN B-1) 100 MG tablet Take 1 tablet (100 mg total) by mouth daily. 03/21/23   Barrett, Rocky SAUNDERS, PA-C  zinc  sulfate, 50mg  elemental zinc , 220 (50 Zn) MG capsule Place 1 capsule (220 mg total) into feeding tube daily. 03/21/23   Barrett, Rocky SAUNDERS, PA-C    Social History   Socioeconomic History   Marital status: Single    Spouse name: Not on file   Number of children: Not on file   Years of education: Not on file   Highest education level: Not on file  Occupational History   Not on file  Tobacco Use   Smoking status: Former    Current packs/day: 0.00    Average packs/day: 0.5 packs/day for 10.0 years (5.0 ttl pk-yrs)    Types: Cigarettes    Start date: 41    Quit date: 72    Years since quitting: 47.1   Smokeless tobacco: Never  Vaping Use   Vaping status: Never Used  Substance  and Sexual Activity   Alcohol use: Not Currently    Comment: no alcohol since 78 years old   Drug use: Never   Sexual activity: Not Currently  Other Topics Concern   Not on file  Social History Narrative   Not on file   Social Drivers of Health   Financial Resource Strain: Not on file  Food Insecurity: No Food Insecurity (02/14/2023)   Hunger Vital Sign    Worried About Running Out of Food in the Last Year: Never true    Ran Out of Food in the Last Year: Never true  Transportation Needs: No Transportation Needs (02/14/2023)   PRAPARE - Administrator, Civil Service (Medical): No    Lack of Transportation (Non-Medical): No  Physical Activity: Not on file  Stress: Not on file  Social Connections: Patient Unable To Answer (03/12/2023)   Social Connection and Isolation Panel [NHANES]    Frequency of Communication with Friends and Family: Patient unable to answer    Frequency of Social Gatherings with Friends and Family: Patient unable to answer    Attends Religious Services: Patient unable to answer    Active Member of Clubs or Organizations: Patient unable to answer     Attends Banker Meetings: Patient unable to answer    Marital Status: Patient unable to answer  Intimate Partner Violence: Not At Risk (02/14/2023)   Humiliation, Afraid, Rape, and Kick questionnaire    Fear of Current or Ex-Partner: No    Emotionally Abused: No    Physically Abused: No    Sexually Abused: No     Family History  Problem Relation Age of Onset   Diabetes Mother        gestational diabetes   Hypertension Father    Alcohol abuse Father    Heart disease Brother    Alcohol abuse Brother    Drug abuse Brother     ROS: Otherwise negative unless mentioned in HPI  Physical Examination  Vitals:   04/19/23 0501 04/19/23 0942  BP: (!) 112/50 (!) 108/58  Pulse: (!) 52 (!) 58  Resp: 16 17  Temp: 98 F (36.7 C)   SpO2: 98% 100%   Body mass index is 16.49 kg/m.  General:  WDWN in NAD Gait: Not observed HENT: WNL, normocephalic Pulmonary: normal non-labored breathing, without Rales, rhonchi,  wheezing Cardiac: regular Abdomen:  soft, NT/ND, no masses Skin: without rashes Vascular Exam/Pulses: symmetrical radial pulses Extremities: without ischemic changes, without Gangrene , without cellulitis; without open wounds;  Musculoskeletal: no muscle wasting or atrophy  Neurologic: Alert but somewhat confused; no one sided motor or sensation deficit Lymph:  Unremarkable  CBC    Component Value Date/Time   WBC 4.4 04/16/2023 0536   RBC 4.53 04/16/2023 0536   HGB 12.9 (L) 04/16/2023 0536   HCT 39.8 04/16/2023 0536   PLT 198 04/16/2023 0536   MCV 87.9 04/16/2023 0536   MCH 28.5 04/16/2023 0536   MCHC 32.4 04/16/2023 0536   RDW 17.7 (H) 04/16/2023 0536   LYMPHSABS 0.8 03/28/2023 0536   MONOABS 0.8 03/28/2023 0536   EOSABS 0.2 03/28/2023 0536   BASOSABS 0.1 03/28/2023 0536    BMET    Component Value Date/Time   NA 133 (L) 04/17/2023 1257   K 4.7 04/17/2023 1257   CL 98 04/17/2023 1257   CO2 25 04/17/2023 1257   GLUCOSE 94 04/17/2023 1257    BUN 22 04/17/2023 1257   CREATININE 0.89 04/17/2023 1257  CALCIUM  9.1 04/17/2023 1257   GFRNONAA >60 04/17/2023 1257    COAGS: Lab Results  Component Value Date   INR 1.6 (H) 03/03/2023   INR 1.1 03/02/2023   INR 1.1 03/01/2023     Non-Invasive Vascular Imaging:   MRI brain positive for left-sided acute infarcts  CTA head and neck pending  Carotid duplex from December demonstrating 80 to 99% stenosis of the left ICA    ASSESSMENT/PLAN: This is a 78 y.o. male with left sided CVA; left carotid stenosis  [name] is a 78 year old male who sustained an NSTEMI requiring Impella device placement.  He underwent CABG and postoperatively was discharged to inpatient rehabilitation.  Preoperative workup included carotid duplex demonstrating asymptomatic left ICA stenosis 80 to 99%.  While in CIR he experienced confusion and increasing agitation as well as cognitive decline and thus MRI brain was ordered.  This was positive for acute left-sided infarcts.  He was scheduled to be seen in the office with a CTA of the head and neck and considered for left-sided TCAR.  Agree with dual antiplatelet therapy.  CTA head and neck pending.  On-call vascular surgeon Dr. Magda will evaluate the patient after CTA has been performed to discuss treatment plan.   Walter Sender PA-C [facility] [phone]  VASCULAR STAFF ADDENDUM: I have independently interviewed and examined the patient. I agree with the above.  Symptomatic left carotid artery stenosis in setting of ongoing recovery from CABG. He has known left 80-99% carotid artery stenosis found during workup for CABG. Patient experiencing cognitive decline and agitation, prompting MRI which showed a focal left-sided infarct.  CT angiogram shows severe left carotid artery stenosis amenable to either TCAR or CEA. I discussed with the patient's son, Walter Vargas, in detail at the bedside. I reviewed the risks of stroke, cranial  nerve injury, hematoma.   I explained that the 2 techniques are complementary. Dr. Jerri would like to maintain the patient on Eliquis  postoperatively because of his depressed ejection fraction. I think this is very reasonable, and should pushes away from TCAR, to avoid triple therapy. Will plan to proceed with left CEA next week as a schedule allows.  Debby SAILOR. Magda, MD [facility] Phone Number: [phone] 04/20/2023 12:31 PM

## 2023-04-19 NOTE — Progress Notes (Signed)
 Occupational Therapy Session Note  Patient Details  Name: Walter Vargas MRN: [id number] Date of Birth: Jun 08, 1945  Today's Date: 04/19/2023 OT Individual Time: 9154-9084 OT Individual Time Calculation (min): 30 min    Short Term Goals: Week 4:  OT Short Term Goal 1 (Week 4): Pt will complete toileting tasks with CGA OT Short Term Goal 2 (Week 4): Pt will complete LB dressing with CGA OT Short Term Goal 3 (Week 4): Pt will properly place UE during sit <> stands with external cueing  Skilled Therapeutic Interventions/Progress Updates:   Pt standing with NT after BM in bathroom upon OT arrival for brief 30 minute visit scheduled. Pt unsteady on feet and staff assisted safely in sitting w/c level. MD rounded with pt while OT prepared ADL session and cleaned bathroom soiled with stool laden cloths etc. Pt content to sink side bathe, groom and dress. Cues and set up for UB and grooming, CGA and cues for LB all with pull on simple top and bottoms. Pt open to all activity presented but requires prompting for STM deficits and reassurance as pt states, am I late?.  Standing balance progression with UE support needed with trials using unilateral hands for pulling pants and simple excursions laterally. Pt remained seated at end of session with all needs, nurse call button and chair alarm set. Care coordination with MD and NT this visit.   Pain: denies pain, just reported dry skin on arms and lotion offered and pt applied  Therapy Documentation Precautions:  Precautions Precautions: Fall, Sternal, Other (comment) Precaution Booklet Issued: No Precaution Comments: cortrak, sternal precautions Restrictions Weight Bearing Restrictions Per Provider Order: No RUE Weight Bearing Per Provider Order: Weight bearing as tolerated LUE Weight Bearing Per Provider Order: Weight bearing as tolerated   Therapy/Group: Individual Therapy  Geni CHRISTELLA Andreas 04/19/2023, 9:16 AM

## 2023-04-19 NOTE — Progress Notes (Signed)
 STROKE TEAM PROGRESS NOTE   SUBJECTIVE (INTERVAL HISTORY) No family is at the bedside.  Overall his condition is gradually improving. He still has sundowning with cognitive impairment especially in the evening and at night. This morning on my exam, he is calm and orientated but some psychomotor slowing.    OBJECTIVE Temp:  [98 F (36.7 C)-98.1 F (36.7 C)] 98 F (36.7 C) (02/06 0501) Pulse Rate:  [52-65] 58 (02/06 0942) Resp:  [16-18] 17 (02/06 0942) BP: (108-126)/(50-90) 108/58 (02/06 0942) SpO2:  [93 %-100 %] 100 % (02/06 0942)  No results for input(s): GLUCAP in the last 168 hours. Recent Labs  Lab 04/13/23 0631 04/16/23 0536 04/17/23 1257  NA 131* 133* 133*  K 5.6* 4.2 4.7  CL 101 103 98  CO2 19* 23 25  GLUCOSE 68* 76 94  BUN 27* 12 22  CREATININE 0.91 0.80 0.89  CALCIUM  9.1 8.7* 9.1  MG 2.1  --  1.7  PHOS 3.1  --   --    Recent Labs  Lab 04/13/23 0631 04/17/23 1257  AST 31 34  ALT 28 38  ALKPHOS 87 104  BILITOT 1.1 0.8  PROT 6.5 6.9  ALBUMIN  3.0* 3.2*   Recent Labs  Lab 04/16/23 0536  WBC 4.4  HGB 12.9*  HCT 39.8  MCV 87.9  PLT 198   No results for input(s): CKTOTAL, CKMB, CKMBINDEX, TROPONINI in the last 168 hours. No results for input(s): LABPROT, INR in the last 72 hours. No results for input(s): COLORURINE, LABSPEC, PHURINE, GLUCOSEU, HGBUR, BILIRUBINUR, KETONESUR, PROTEINUR, UROBILINOGEN, NITRITE, LEUKOCYTESUR in the last 72 hours.  Invalid input(s): APPERANCEUR     Component Value Date/Time   CHOL 90 02/11/2023 0546   TRIG 45 02/11/2023 0546   HDL 38 (L) 02/11/2023 0546   CHOLHDL 2.4 02/11/2023 0546   VLDL 9 02/11/2023 0546   LDLCALC 43 02/11/2023 0546   Lab Results  Component Value Date   HGBA1C 5.9 (H) 02/28/2023   No results found for: LABOPIA, COCAINSCRNUR, LABBENZ, AMPHETMU, THCU, LABBARB  No results for input(s): ETH in the last 168 hours.  I have personally reviewed the  radiological images below and agree with the radiology interpretations.  MR BRAIN WO CONTRAST Result Date: 04/18/2023 CLINICAL DATA:  Mental status change, persistent or worsening. Memory loss. EXAM: MRI HEAD WITHOUT CONTRAST TECHNIQUE: Multiplanar, multiecho pulse sequences of the brain and surrounding structures were obtained without intravenous contrast. COMPARISON:  CT head without contrast 02/10/2023. FINDINGS: Brain: Five separate subcentimeter foci of restricted diffusion are present within the left corona radiata and periatrial white matter. a 7 mm lesion in the posterior left corona radiata and demonstrates peripheral diffusion signal and central T2 signal, consistent with a subacute insult. Moderate confluent periventricular and scattered subcortical T2 hyperintensities are somewhat more prominent on the left. No acute infarct or hemorrhage is present. Deep brain nuclei are within normal limits. The ventricles are proportionate to the degree of atrophy. No significant extraaxial fluid collection is present. The brainstem and cerebellum are within normal limits. Midline structures are within normal limits. Vascular: Evaluation of vessels is somewhat limited due to patient motion. Flow is present in the anterior circulation bilaterally although the left internal carotid artery may be narrowed. CT angiography may be useful for further evaluation. Skull and upper cervical spine: The craniocervical junction is normal. Upper cervical spine is within normal limits. Marrow signal is unremarkable. Sinuses/Orbits: The paranasal sinuses and mastoid air cells are clear. The globes and orbits are within  normal limits. IMPRESSION: 1. Five separate subcentimeter foci of restricted diffusion within the left corona radiata and periatrial white matter consistent with acute infarcts. 2. 7 mm lesion in the posterior left corona radiata demonstrates peripheral diffusion signal and central T2 signal, consistent with a  subacute insult. 3. Moderate confluent periventricular and scattered subcortical T2 hyperintensities bilaterally are somewhat more prominent on the left. This likely reflects the sequela of chronic microvascular ischemia. 4. Evaluation of vessels is somewhat limited due to patient motion. Flow is present in the anterior circulation bilaterally although the left internal carotid artery may be narrowed. CT angiography may be useful for further evaluation. These results will be called to the ordering clinician or representative by the Radiologist Assistant, and communication documented in the PACS or [employer]. Electronically Signed   By: Lonni Necessary M.D.   On: 04/18/2023 13:40   DG CHEST PORT 1 VIEW Result Date: 03/29/2023 CLINICAL DATA:  Cough.  Postop CABG. EXAM: PORTABLE CHEST 1 VIEW COMPARISON:  Radiographs 03/20/2023 and 03/16/2023.  CT 02/10/2023. FINDINGS: 1407 hours. The heart size and mediastinal contours are stable status post median sternotomy and CABG. Feeding tube and left arm PICC have been removed in the interval. Bilateral pleural effusions have decreased in volume, and there is improved aeration of both lungs. No evidence of edema or pneumothorax. No acute osseous findings are seen. There is an old fracture of the left 5th rib IMPRESSION: Interval decrease in volume of bilateral pleural effusions and improved aeration of both lungs. No evidence of edema or pneumothorax. Electronically Signed   By: Elsie Perone M.D.   On: 03/29/2023 18:13     PHYSICAL EXAM  Temp:  [98 F (36.7 C)-98.1 F (36.7 C)] 98 F (36.7 C) (02/06 0501) Pulse Rate:  [52-65] 58 (02/06 0942) Resp:  [16-18] 17 (02/06 0942) BP: (108-126)/(50-90) 108/58 (02/06 0942) SpO2:  [93 %-100 %] 100 % (02/06 0942)  General - Well nourished, well developed, in no apparent distress.  Ophthalmologic - fundi not visualized due to noncooperation.  Cardiovascular - Regular rhythm and rate.  Neuro - sitting in  chair, awake, alert, eyes open, orientated to age, place, time but mild psychomotor slowing. No aphasia, paucity of speech but still fluent language, following all simple commands. Able to name and repeat with occasional paraphasic errors. No gaze palsy, tracking bilaterally, visual field full. No facial droop. Tongue midline. Bilateral UEs 5/5, no drift. Bilaterally LEs 4/5, no drift. Sensation symmetrical bilaterally, b/l FTN intact, gait not tested.     ASSESSMENT/PLAN [name] is a 77 y.o. male with history of HTN, CAD with NSTEMI, CHF with EF < 20% admitted in 01/2023 for CP, SOB. S/p cath and CABG. Discharged to CIR on 1/7, but had confusion, sundowning and delirium. MRI showed left MCA scattered infarct. No TNK given due to outside window.    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 Cardiomyopathy with low EF CT 11/30 no acute finding MRI 2/5 several small left MCA scattered acute and subacute infarcts CTA head and neck pending CUS 12/12 left ICA 80-99% stenosis 2D Echo  EF < 20% TEE 12/19 and 12/30 EF < 20% and no PFO LDL 43 HgbA1c 5.9 SCDs for VTE prophylaxis aspirin  81 mg daily prior to admission, now on aspirin  81 mg daily. Will start DAPT for carotid stenosis. However, given low EF, may need anticoagulation too.   Ongoing aggressive stroke risk  factor management  Left carotid stenosis CTA head and neck pending CUS 12/12 left ICA 80-99% stenosis [facility] was contacted last admission in 02/2023 and plan to have intervention in a month with CTA head and neck Recommend [facility] consult  Now on DAPT   CAD/MI s/p CABG Cardiomyopathy with low EF S/p cath 12/3 S/p CABG 12/20 On amiodarone  and digoxin  2D Echo  EF < 20% TEE 12/19 and 12/30 EF < 20% On ASA 81, add plavix  today Repeat Echo pending If EF still < 30%, will need anticoagulation  Delirium  Cognitive impairment Sundowning and inpt delirium Psych on  board Management by primary team  Hypertension Stable BP goal 130-160 before carotid intervention Long term BP goal normotensive  Hyperlipidemia Home meds:  lipitor  80  LDL 43, goal < 70 Now on lipitor  80 Continue statin at discharge  Other Stroke Risk Factors Advanced age  Other Active Problems   Hospital day # 30  I discussed with Dr. Emeline. I spent extensive face-to-face time with the patient, more than 50% of which was spent in counseling and coordination of care, reviewing test results, images and medication, and discussing the diagnosis, treatment plan and potential prognosis. This patient's care requiresreview of multiple databases, neurological assessment, discussion with family, other specialists and medical decision making of high complexity.    Ary Cummins, MD PhD Stroke Neurology 04/19/2023 12:05 PM    To contact Stroke Continuity provider, please refer to [email or web address]. After hours, contact General Neurology

## 2023-04-19 NOTE — Plan of Care (Signed)
  Problem: RH BOWEL ELIMINATION Goal: RH STG MANAGE BOWEL WITH ASSISTANCE Description: STG Manage Bowel with toileting Assistance. Outcome: Progressing Goal: RH STG MANAGE BOWEL W/MEDICATION W/ASSISTANCE Description: STG Manage Bowel with Medication with Mod I Assistance. Outcome: Progressing   Problem: RH BLADDER ELIMINATION Goal: RH STG MANAGE BLADDER WITH ASSISTANCE Description: STG Manage Bladder With toileting Assistance Outcome: Progressing   Problem: RH SKIN INTEGRITY Goal: RH STG SKIN FREE OF INFECTION/BREAKDOWN Description: Manage skin with min assist Outcome: Progressing   Problem: RH SAFETY Goal: RH STG ADHERE TO SAFETY PRECAUTIONS W/ASSISTANCE/DEVICE Description: STG Adhere to Safety Precautions With cues Outcome: Not Progressing   Problem: RH PAIN MANAGEMENT Goal: RH STG PAIN MANAGED AT OR BELOW PT'S PAIN GOAL Description: Less than 4 with prns Outcome: Progressing   Problem: RH KNOWLEDGE DEFICIT GENERAL Goal: RH STG INCREASE KNOWLEDGE OF SELF CARE AFTER HOSPITALIZATION Description: Patient will be able to take care of his meds ; diet and Manage skin care using educational resources independently  Outcome: Not Progressing

## 2023-04-19 NOTE — Progress Notes (Signed)
 Physical Therapy Weekly Progress Note  Patient Details  Name: Walter Vargas MRN: [id number] Date of Birth: 1945-10-05  Beginning of progress report period: April 09, 2023 End of progress report period: April 19, 2023  Today's Date: 04/19/2023 PT Individual Time: 1020-1100 PT Individual Time Calculation (min): 40 min   Today's Date: 04/19/2023 PT Individual Time: 1300-1414 PT Individual Time Calculation (min): 74 min   Patient has met 0 of 0  short term goals. Pt hsa not met any mobility goals due to none being set during current reporting period. Pt's discharge has been delayed due to lack of availability of supervision at home and currently pursuing SNF options. Pt has maintained CGA to minA level of mobility, depending on task and balance and strength requirement. Pt has improved general functional mobility, but cognitive impairments have been largest limiting factor in progress as pt has very poor carryover between and within sessions. Pt will benefit from family education prior to discharge.   Patient continues to demonstrate the following deficits muscle weakness, decreased cardiorespiratoy endurance, decreased motor planning, decreased attention, decreased awareness, decreased problem solving, decreased safety awareness, and decreased memory, and decreased sitting balance, decreased standing balance, decreased postural control, and decreased balance strategies and therefore will continue to benefit from skilled PT intervention to increase functional independence with mobility.  Patient progressing toward long term goals..  Continue plan of care.  PT Short Term Goals Week 3:  PT Short Term Goal 1 (Week 3): STGs = LTGs Week 4:  PT Short Term Goal 1 (Week 4): STGs = LTGs  Skilled Therapeutic Interventions/Progress Updates:  Community reintegration;Ambulation/gait training;DME/adaptive equipment instruction;Neuromuscular re-education;Psychosocial support;Stair training;UE/LE Strength  taining/ROM;Balance/vestibular training;Functional electrical stimulation;Therapeutic Activities;UE/LE Coordination activities;Pain management;Skin care/wound management;Discharge planning;Cognitive remediation/compensation;Disease management/prevention;Patient/family education;Functional mobility training;Splinting/orthotics;Therapeutic Exercise;Visual/perceptual remediation/compensation   1st Session: Pt received seated in [facility] and agrees to therapy. No complaint of pain. WC transport to gym for time management. Pt completes Biodex activity to visualize weight distribution and promote anterior shift of center of gravity, as pt tends to stand with posterior bias. Prior to cueing, pt has 100% of body weight posterior to midfoot. With cueing to push hips forward and stand on your toes, pt is able to substantially shift weight anteriorly, but pt fatigues quickly and requests to sit back down. Following rest break, pt performs standing activity at hall handrail to promote upright posture and balance challenge. Pt performs 2x20 heel raises with cues for upright posture and increasing hip extension, with CGA/minA provided for stability. Pt then performs 2x10 standing hip abduction for balance challenge and strengthening of hip abductors. PT cues pt to complete standing hip extension but pt has difficulty with motor planning movement, despite repeated attempts to demonstrate movement. WC transport back to room. Left seated with alarm intact and all needs within reach.   2nd Session: Pt received seated in [facility] and agrees to therapy. No complaint of pain. RN present and asks if pt has been having increased cognitive issues. PT relays that pt has had ongoing cognitive issues during stay that appear to be slightly worse, though pt is still oriented to time and place. WC transport to gym. Pt performs stand step transfer to Nustep with minA due to pt having difficulty with sequencing of transfer and with attention. Pt  completes Nustep for endurance and reciprocal coordination, as well as challenging attention to task. Pt completes x12:00 at workload of 5 with average steps per minute ~35. PT provides cues for hand and foot placement and completing full  available ROM.   Pt takes seated rest break. Pt verbalizes need to use restroom. Pt stands with minA and no AD, then ambulates x30' to toilet with modA and cues for upright posture and positioning for safe transition onto toilet. Following toileting, pt stands with modA and cues for hand placement and initiation. Pt ambulates to sink with modA and requires minA/modA to perform static standing at sink due to consistent posterior bias. Pt ambulates back to [facility] with same assistance and cues. Following rest break, pt stands after repeated attempts and cues for sequencing, then ambulates x175' with minA and cues for upright posture to improve balance, and safe AD management for transition back to WC. WC transport back to room. Left seated with alarm intact and all needs within reach.   Therapy Documentation Precautions:  Precautions Precautions: Fall, Sternal, Other (comment) Precaution Booklet Issued: No Precaution Comments: cortrak, sternal precautions Restrictions Weight Bearing Restrictions Per Provider Order: No RUE Weight Bearing Per Provider Order: Weight bearing as tolerated LUE Weight Bearing Per Provider Order: Weight bearing as tolerated   Therapy/Group: Individual Therapy  Elsie JAYSON Dawn, PT, DPT 04/19/2023, 3:58 PM

## 2023-04-20 ENCOUNTER — Inpatient Hospital Stay (HOSPITAL_COMMUNITY): Payer: 59

## 2023-04-20 DIAGNOSIS — I6389 Other cerebral infarction: Secondary | ICD-10-CM | POA: Diagnosis not present

## 2023-04-20 LAB — ECHOCARDIOGRAM COMPLETE
Area-P 1/2: 3.45 cm2
Calc EF: 38.2 %
Height: 68 in
S' Lateral: 5.2 cm
Single Plane A2C EF: 43.5 %
Single Plane A4C EF: 25.3 %
Weight: 1735.46 [oz_av]

## 2023-04-20 LAB — GLUCOSE, CAPILLARY: Glucose-Capillary: 71 mg/dL (ref 70–99)

## 2023-04-20 MED ORDER — APIXABAN 5 MG PO TABS
5.0000 mg | ORAL_TABLET | Freq: Two times a day (BID) | ORAL | Status: DC
  Administered 2023-04-20 – 2023-04-22 (×4): 5 mg via ORAL
  Filled 2023-04-20 (×4): qty 1

## 2023-04-20 MED ORDER — HYDROXYZINE HCL 25 MG PO TABS: 25.0000 mg | ORAL_TABLET | Freq: Three times a day (TID) | ORAL | Status: DC | PRN

## 2023-04-20 NOTE — Progress Notes (Signed)
 Occupational Therapy Session Note  Patient Details  Name: Walter Vargas MRN: [id number] Date of Birth: [birth year]  Today's Date: 04/21/2023 OT Individual Time: 8495-8456 OT Individual Time Calculation (min): 39 min    Short Term Goals: Week 5:  OT Short Term Goal 1 (Week 5): STG= LTG in anticipation of SNF d/c  Skilled Therapeutic Interventions/Progress Updates:  Pt received resting in bed, reporting pain in buttock due to fall . . . This morning. Time dedicated to nursing assessment. Pt requires increased levels of encouragement from therapist and family to participate in session. Pt ultimately receptive to . . . Just a short walk. Family present for moral support as patient ambulates greater than household-level distance around unit, CGA + RW, cuing provided for safe RW proximity, gait speed, and object avoidance (greater difficulty L>R sides of hallway). VSS during session. Son open to hands-on training, ambulating with patient from day room>room W24. Pt does demo decreased impulsivity with gait while ambulating with son. Pt remained resting in bed, bed alarm activated, family present.   Therapy Documentation Precautions:  Precautions Precautions: Fall, Sternal, Other (comment) Precaution Booklet Issued: No Precaution Comments: cortrak, sternal precautions Restrictions Weight Bearing Restrictions Per Provider Order: No RUE Weight Bearing Per Provider Order: Weight bearing as tolerated LUE Weight Bearing Per Provider Order: Weight bearing as tolerated  Therapy/Group: Individual Therapy  Nereida Habermann, OTR/L, MSOT  04/21/2023, 3:51 PM

## 2023-04-20 NOTE — Progress Notes (Signed)
 Physical Therapy Session Note  Patient Details  Name: SACHA RADLOFF MRN: [id number] Date of Birth: 06/25/1945  Today's Date: 04/20/2023 PT Individual Time: 1116-1200 PT Individual Time Calculation (min): 44 min   Short Term Goals: Week 4:  PT Short Term Goal 1 (Week 4): STGs = LTGs  Skilled Therapeutic Interventions/Progress Updates:     Pt received seated in [facility] and agrees to therapy. No complaint of pain. Pt's son present for family education. PT provides update on pt's progress with mobility and with therapy, as well as making recommendations for safe mobility following discharge. PT continues to recommend pt discharge with 24/7 supervision. Pt stands with CGA and cues for hand placement, sequencing, and body mechanics. Pt ambulates x400' with RW and consistent cueing to increase proximity to RW and maintain upright gaze to promote improved posture and balance. Seated rest break. Pt performs repeated reps of sit to stand with arms folded across chest to increase balance demand as well as strengthening through lower extremities. Pt requires minA to modA to complete, primarily to facilitate anterior weight shift. Pt completes x10 total with seated rest breaks. Pt ambulates back to room with RW and same cues. Left seated with alarm intact and all needs within reach.  Therapy Documentation Precautions:  Precautions Precautions: Fall, Sternal, Other (comment) Precaution Booklet Issued: No Precaution Comments: cortrak, sternal precautions Restrictions Weight Bearing Restrictions Per Provider Order: No RUE Weight Bearing Per Provider Order: Weight bearing as tolerated LUE Weight Bearing Per Provider Order: Weight bearing as tolerated  Therapy/Group: Individual Therapy  Elsie JAYSON Dawn, PT, DPT 04/20/2023, 3:46 PM

## 2023-04-20 NOTE — Plan of Care (Signed)
   Problem: Consults Goal: RH GENERAL PATIENT EDUCATION Description: See Patient Education module for education specifics. Outcome: Progressing   Problem: RH BOWEL ELIMINATION Goal: RH STG MANAGE BOWEL WITH ASSISTANCE Description: STG Manage Bowel with toileting Assistance. Outcome: Progressing   Problem: RH BLADDER ELIMINATION Goal: RH STG MANAGE BLADDER WITH ASSISTANCE Description: STG Manage Bladder With toileting Assistance Outcome: Progressing

## 2023-04-20 NOTE — Progress Notes (Signed)
  Echocardiogram 2D Echocardiogram has been performed.  Fain Home RDCS 04/20/2023, 9:14 AM

## 2023-04-20 NOTE — Progress Notes (Signed)
 Occupational Therapy Session Note  Patient Details  Name: Walter Vargas MRN: [id number] Date of Birth: 1945-07-28  Today's Date: 04/20/2023 OT Individual Time: 9089-9054 OT Individual Time Calculation (min): 35 min    Short Term Goals: Week 4:  OT Short Term Goal 1 (Week 4): Pt will complete toileting tasks with CGA OT Short Term Goal 2 (Week 4): Pt will complete LB dressing with CGA OT Short Term Goal 3 (Week 4): Pt will properly place UE during sit <> stands with external cueing  Skilled Therapeutic Interventions/Progress Updates:    Pt received supine with no c/o pain, agreeable to OT session. Echo being done when OT initialy entered room so first 10 min of session missed. His son [name] was present for family education. Provided education re cognitive deficits, CLOF, performance of ADLs, concerns re home d/c without supervision, and d/c planning. Verbal education provided re fall risk reduction, energy conservation strategies, home carryover of transfer training, ADLs, and IADLs.Pt came to EOB with (S). He demonstrated difficulty sequencing sit <> stand transfer from EOB, requiring mod cueing for both hand placement, scooting to the edge, head-hip relationship and planting feet firmly on the ground. Once he followed this he was able to stand with min A to correct posterior lean. He completed functional mobility to the bathroom using the RW, CGA. He required mod cueing for sequencing through toileting tasks, not pulling down clothes prior to sitting. He did have continent urine void, as well as small incontinent. He returned to his w/c following. He donned pants and shoes with min A. He was left sitting up with all needs met, son present and given yogurt and juice as snack.      Therapy Documentation Precautions:  Precautions Precautions: Fall, Sternal, Other (comment) Precaution Booklet Issued: No Precaution Comments: cortrak, sternal precautions Restrictions Weight Bearing  Restrictions Per Provider Order: No RUE Weight Bearing Per Provider Order: Weight bearing as tolerated LUE Weight Bearing Per Provider Order: Weight bearing as tolerated  Therapy/Group: Individual Therapy  [name] 04/20/2023, 6:12 AM

## 2023-04-20 NOTE — Progress Notes (Signed)
 Speech Language Pathology Daily Session Note  Patient Details  Name: Walter Vargas MRN: [id number] Date of Birth: 10/25/1945  Today's Date: 04/20/2023 SLP Individual Time: 0950-1030 SLP Individual Time Calculation (min): 40 min  Short Term Goals: Week 4: SLP Short Term Goal 1 (Week 4): Patient will utilize external memory aids to recall daily information with 80% accuracy given mod assist. SLP Short Term Goal 2 (Week 4): Patient will demonstrate intellectual awareness of deficits as evidenced by stating 2 cognitive and 2 physical deficits with max assist. SLP Short Term Goal 3 (Week 4): Patient will solve basic environmental problems with 90% accuracy given mod assist.  Skilled Therapeutic Interventions: SLP conducted skilled therapy session targeting cognitive retraining goals. Patient's son, Geryl, was present throughout duration of session and participated in cuing and observing patient's current cognitive status. SLP provided education re: goals, current status, and supports needed throughout. Guided patient through clock drawing task. Patient benefited from min assist to place numbers inside clock and then max to total assist to accurately state hand position on the clock. During basic medication management task, patient benefited from mod cues to interpret medication instructions and max assist to locate errors in a BID pill box with field of visually available task minimized to one box on the worksheet at a time. Patient required total assist to attend to specific details on the page rather than look at all other available (and irrelevant) information. Patient was left in chair with call bell in reach and chair alarm set. SLP will continue to target goals per plan of care.      Pain Pain Assessment Pain Scale: 0-10 Pain Score: 0-No pain  Therapy/Group: Individual Therapy  Amaan Meyer, M.A., CCC-SLP  Donnelle Olmeda A Xzayvion Vaeth 04/20/2023, 12:26 PM

## 2023-04-20 NOTE — Progress Notes (Addendum)
 STROKE TEAM PROGRESS NOTE   SUBJECTIVE (INTERVAL HISTORY) No acute event overnight. Echo back showed EF 20-25%. His CTA also confirmed left ICA string sign. Discussed with Dr. Emeline and Dr. Magda, he will need [facility] and also carotid endarterectomy.    OBJECTIVE Temp:  [98 F (36.7 C)-98.1 F (36.7 C)] 98 F (36.7 C) (02/07 1424) Pulse Rate:  [57-61] 61 (02/07 1424) Resp:  [16-18] 18 (02/07 1424) BP: (105-120)/(58-67) 105/67 (02/07 1424) SpO2:  [95 %-100 %] 100 % (02/07 1424)  Recent Labs  Lab 04/20/23 0613  GLUCAP 71   Recent Labs  Lab 04/16/23 0536 04/17/23 1257  NA 133* 133*  K 4.2 4.7  CL 103 98  CO2 23 25  GLUCOSE 76 94  BUN 12 22  CREATININE 0.80 0.89  CALCIUM  8.7* 9.1  MG  --  1.7   Recent Labs  Lab 04/17/23 1257  AST 34  ALT 38  ALKPHOS 104  BILITOT 0.8  PROT 6.9  ALBUMIN  3.2*   Recent Labs  Lab 04/16/23 0536  WBC 4.4  HGB 12.9*  HCT 39.8  MCV 87.9  PLT 198   No results for input(s): CKTOTAL, CKMB, CKMBINDEX, TROPONINI in the last 168 hours. No results for input(s): LABPROT, INR in the last 72 hours. No results for input(s): COLORURINE, LABSPEC, PHURINE, GLUCOSEU, HGBUR, BILIRUBINUR, KETONESUR, PROTEINUR, UROBILINOGEN, NITRITE, LEUKOCYTESUR in the last 72 hours.  Invalid input(s): APPERANCEUR     Component Value Date/Time   CHOL 90 02/11/2023 0546   TRIG 45 02/11/2023 0546   HDL 38 (L) 02/11/2023 0546   CHOLHDL 2.4 02/11/2023 0546   VLDL 9 02/11/2023 0546   LDLCALC 43 02/11/2023 0546   Lab Results  Component Value Date   HGBA1C 5.9 (H) 02/28/2023   No results found for: LABOPIA, COCAINSCRNUR, LABBENZ, AMPHETMU, THCU, LABBARB  No results for input(s): ETH in the last 168 hours.  I have personally reviewed the radiological images below and agree with the radiology interpretations.  ECHOCARDIOGRAM COMPLETE Result Date: 04/20/2023    ECHOCARDIOGRAM REPORT   Patient Name:   Walter Vargas  Date of Exam: 04/20/2023 Medical Rec #:  [id number]     Height:       68.0 in Accession #:    [id number]    Weight:       108.5 lb Date of Birth:  05-25-1945     BSA:          1.576 m Patient Age:    77 years      BP:           120/62 mmHg Patient Gender: M             HR:           54 bpm. Exam Location:  Inpatient Procedure: 2D Echo, Color Doppler and Cardiac Doppler Indications:    Stroke I63.9  History:        Patient has prior history of Echocardiogram examinations, most                 recent 02/14/2023. HFrEF; Risk Factors:Hypertension.  Sonographer:    Tinnie Gosling [facility] Referring Phys: [id number] ARY Concepcion Gillott IMPRESSIONS  1. Left ventricular ejection fraction, by estimation, is 20 to 25%. The left ventricle has severely decreased function. The left ventricle demonstrates global hypokinesis with septal-lateral dyssynchrony consistent with IVCD. The left ventricular internal cavity size was mildly dilated. Left ventricular diastolic parameters are consistent with Grade II diastolic dysfunction (pseudonormalization).  2. Right  ventricular systolic function is mildly reduced. The right ventricular size is normal. Tricuspid regurgitation signal is inadequate for assessing PA pressure.  3. Left atrial size was moderately dilated.  4. Right atrial size was mildly dilated.  5. The mitral valve is normal in structure. Mild mitral valve regurgitation. No evidence of mitral stenosis.  6. The aortic valve is tricuspid. Aortic valve regurgitation is not visualized. No aortic stenosis is present.  7. The inferior vena cava is normal in size with greater than 50% respiratory variability, suggesting right atrial pressure of 3 mmHg. FINDINGS  Left Ventricle: Left ventricular ejection fraction, by estimation, is 20 to 25%. The left ventricle has severely decreased function. The left ventricle demonstrates global hypokinesis. The left ventricular internal cavity size was mildly dilated. There is no left ventricular hypertrophy. Left  ventricular diastolic parameters are consistent with Grade II diastolic dysfunction (pseudonormalization). Right Ventricle: The right ventricular size is normal. No increase in right ventricular wall thickness. Right ventricular systolic function is mildly reduced. Tricuspid regurgitation signal is inadequate for assessing PA pressure. Left Atrium: Left atrial size was moderately dilated. Right Atrium: Right atrial size was mildly dilated. Pericardium: Trivial pericardial effusion is present. Mitral Valve: The mitral valve is normal in structure. Mild mitral annular calcification. Mild mitral valve regurgitation. No evidence of mitral valve stenosis. Tricuspid Valve: The tricuspid valve is normal in structure. Tricuspid valve regurgitation is trivial. Aortic Valve: The aortic valve is tricuspid. Aortic valve regurgitation is not visualized. No aortic stenosis is present. Pulmonic Valve: The pulmonic valve was normal in structure. Pulmonic valve regurgitation is not visualized. Aorta: The aortic root is normal in size and structure. Venous: The inferior vena cava is normal in size with greater than 50% respiratory variability, suggesting right atrial pressure of 3 mmHg. IAS/Shunts: No atrial level shunt detected by color flow Doppler.  LEFT VENTRICLE PLAX 2D LVIDd:         6.10 cm      Diastology LVIDs:         5.20 cm      LV e' medial:   6.41 cm/s LV PW:         0.90 cm      LV E/e' medial: 9.1 LV IVS:        0.90 cm LVOT diam:     2.00 cm LV SV:         35 LV SV Index:   23 LVOT Area:     3.14 cm  LV Volumes (MOD) LV vol d, MOD A2C: 200.0 ml LV vol d, MOD A4C: 166.0 ml LV vol s, MOD A2C: 113.0 ml LV vol s, MOD A4C: 124.0 ml LV SV MOD A2C:     87.0 ml LV SV MOD A4C:     166.0 ml LV SV MOD BP:      74.6 ml RIGHT VENTRICLE         IVC TAPSE (M-mode): 1.0 cm  IVC diam: 1.30 cm LEFT ATRIUM             Index        RIGHT ATRIUM           Index LA diam:        4.60 cm 2.92 cm/m   RA Area:     20.00 cm LA Vol (A2C):    76.3 ml 48.42 ml/m  RA Volume:   57.50 ml  36.49 ml/m LA Vol (A4C):   97.5 ml 61.88 ml/m LA Biplane Vol: 88.0  ml 55.85 ml/m  AORTIC VALVE LVOT Vmax:   58.10 cm/s LVOT Vmean:  40.900 cm/s LVOT VTI:    0.113 m  AORTA Ao Root diam: 3.50 cm Ao Asc diam:  3.20 cm MITRAL VALVE MV Area (PHT): 3.45 cm    SHUNTS MV Decel Time: 220 msec    Systemic VTI:  0.11 m MV E velocity: 58.30 cm/s  Systemic Diam: 2.00 cm MV A velocity: 56.10 cm/s MV E/A ratio:  1.04 Dalton McleanMD Electronically signed by Ezra Kanner Signature Date/Time: 04/20/2023/9:19:25 AM    Final    CT ANGIO HEAD NECK W WO CM Result Date: 04/19/2023 CLINICAL DATA:  Stroke/TIA EXAM: CT ANGIOGRAPHY HEAD AND NECK WITH AND WITHOUT CONTRAST TECHNIQUE: Multidetector CT imaging of the head and neck was performed using the standard protocol during bolus administration of intravenous contrast. Multiplanar CT image reconstructions and MIPs were obtained to evaluate the vascular anatomy. Carotid stenosis measurements (when applicable) are obtained utilizing NASCET criteria, using the distal internal carotid diameter as the denominator. RADIATION DOSE REDUCTION: This exam was performed according to the departmental dose-optimization program which includes automated exposure control, adjustment of the mA and/or kV according to patient size and/or use of iterative reconstruction technique. CONTRAST:  75mL OMNIPAQUE  IOHEXOL  350 MG/ML SOLN COMPARISON:  None Available. FINDINGS: CT HEAD FINDINGS Brain: There is no mass, hemorrhage or extra-axial collection. The size and configuration of the ventricles and extra-axial CSF spaces are normal. There is hypoattenuation of the white matter, most commonly indicating chronic small vessel disease. Vascular: Atherosclerotic calcification of the internal carotid arteries at the skull base. No abnormal hyperdensity of the major intracranial arteries or dural venous sinuses. Skull: The visualized skull base, calvarium and extracranial  soft tissues are normal. Sinuses/Orbits: No fluid levels or advanced mucosal thickening of the visualized paranasal sinuses. No mastoid or middle ear effusion. Normal orbits. CTA NECK FINDINGS Skeleton: Remote median sternotomy. Other neck: There is an incompletely visualized peripherally enhancing, tubular fluid collection extending from the anterior lower right neck into the anterior mediastinum, with a diameter of 12 mm. Upper chest: No pneumothorax or pleural effusion. No nodules or masses. Aortic arch: There is calcific atherosclerosis of the aortic arch. Conventional 3 vessel aortic branching pattern. RIGHT carotid system: There is mild atherosclerotic calcification at the bifurcation and extending into the proximal ICA with less than 50% stenosis. LEFT carotid system: There is a large amount of mixed density plaque at the carotid bifurcation and extending into the internal carotid artery causing severe stenosis and angiographic string sign Vertebral arteries: Right dominant configuration. There is no dissection, occlusion or flow-limiting stenosis to the skull base (V1-V3 segments). CTA HEAD FINDINGS POSTERIOR CIRCULATION: Vertebral arteries are normal. No proximal occlusion of the anterior or inferior cerebellar arteries. Basilar artery is normal. Superior cerebellar arteries are normal. Posterior cerebral arteries are normal. ANTERIOR CIRCULATION: Atherosclerotic calcification of the internal carotid arteries at the skull base with severe stenosis of the left clinoid segment. Anterior cerebral arteries are normal. Middle cerebral arteries are normal. Venous sinuses: As permitted by contrast timing, patent. Anatomic variants: Patent right P-comm Review of the MIP images confirms the above findings. IMPRESSION: 1. No emergent large vessel occlusion. 2. 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 3. Severe stenosis of the left ICA clinoid  segment. 4. Incompletely visualized peripherally enhancing, tubular structure extending from the anterior lower right neck into the anterior mediastinum, with a diameter of 12 mm.  This is favored to be a thrombosed venous structure, possibly related to prior cardiothoracic surgery. An abscess or similar fluid collection is felt less likely. 5.  Aortic atherosclerosis (ICD10-I70.0). Electronically Signed   By: Franky Stanford M.D.   On: 04/19/2023 22:19   MR BRAIN WO CONTRAST Result Date: 04/18/2023 CLINICAL DATA:  Mental status change, persistent or worsening. Memory loss. EXAM: MRI HEAD WITHOUT CONTRAST TECHNIQUE: Multiplanar, multiecho pulse sequences of the brain and surrounding structures were obtained without intravenous contrast. COMPARISON:  CT head without contrast 02/10/2023. FINDINGS: Brain: Five separate subcentimeter foci of restricted diffusion are present within the left corona radiata and periatrial white matter. a 7 mm lesion in the posterior left corona radiata and demonstrates peripheral diffusion signal and central T2 signal, consistent with a subacute insult. Moderate confluent periventricular and scattered subcortical T2 hyperintensities are somewhat more prominent on the left. No acute infarct or hemorrhage is present. Deep brain nuclei are within normal limits. The ventricles are proportionate to the degree of atrophy. No significant extraaxial fluid collection is present. The brainstem and cerebellum are within normal limits. Midline structures are within normal limits. Vascular: Evaluation of vessels is somewhat limited due to patient motion. Flow is present in the anterior circulation bilaterally although the left internal carotid artery may be narrowed. CT angiography may be useful for further evaluation. Skull and upper cervical spine: The craniocervical junction is normal. Upper cervical spine is within normal limits. Marrow signal is unremarkable. Sinuses/Orbits: The paranasal sinuses  and mastoid air cells are clear. The globes and orbits are within normal limits. IMPRESSION: 1. Five separate subcentimeter foci of restricted diffusion within the left corona radiata and periatrial white matter consistent with acute infarcts. 2. 7 mm lesion in the posterior left corona radiata demonstrates peripheral diffusion signal and central T2 signal, consistent with a subacute insult. 3. Moderate confluent periventricular and scattered subcortical T2 hyperintensities bilaterally are somewhat more prominent on the left. This likely reflects the sequela of chronic microvascular ischemia. 4. Evaluation of vessels is somewhat limited due to patient motion. Flow is present in the anterior circulation bilaterally although the left internal carotid artery may be narrowed. CT angiography may be useful for further evaluation. These results will be called to the ordering clinician or representative by the Radiologist Assistant, and communication documented in the PACS or [employer]. Electronically Signed   By: Lonni Necessary M.D.   On: 04/18/2023 13:40   DG CHEST PORT 1 VIEW Result Date: 03/29/2023 CLINICAL DATA:  Cough.  Postop CABG. EXAM: PORTABLE CHEST 1 VIEW COMPARISON:  Radiographs 03/20/2023 and 03/16/2023.  CT 02/10/2023. FINDINGS: 1407 hours. The heart size and mediastinal contours are stable status post median sternotomy and CABG. Feeding tube and left arm PICC have been removed in the interval. Bilateral pleural effusions have decreased in volume, and there is improved aeration of both lungs. No evidence of edema or pneumothorax. No acute osseous findings are seen. There is an old fracture of the left 5th rib IMPRESSION: Interval decrease in volume of bilateral pleural effusions and improved aeration of both lungs. No evidence of edema or pneumothorax. Electronically Signed   By: Elsie Perone M.D.   On: 03/29/2023 18:13     PHYSICAL EXAM  Temp:  [98 F (36.7 C)-98.1 F (36.7 C)] 98  F (36.7 C) (02/07 1424) Pulse Rate:  [57-61] 61 (02/07 1424) Resp:  [16-18] 18 (02/07 1424) BP: (105-120)/(58-67) 105/67 (02/07 1424) SpO2:  [95 %-100 %] 100 % (02/07 1424)  General -  Well nourished, well developed, in no apparent distress.  Ophthalmologic - fundi not visualized due to noncooperation.  Cardiovascular - Regular rhythm and rate.  Neuro - sitting in chair, awake, alert, eyes open, orientated to age, place, time but mild psychomotor slowing. No aphasia, paucity of speech but still fluent language, following all simple commands. Able to name and repeat with occasional paraphasic errors. No gaze palsy, tracking bilaterally, visual field full. No facial droop. Tongue midline. Bilateral UEs 5/5, no drift. Bilaterally LEs 4/5, no drift. Sensation symmetrical bilaterally, b/l FTN intact, gait not tested.     ASSESSMENT/PLAN Walter Vargas is a 78 y.o. male with history of HTN, CAD with NSTEMI, CHF with EF < 20% admitted in 01/2023 for CP, SOB. S/p cath and CABG. Discharged to CIR on 1/7, but had confusion, sundowning and delirium. MRI showed left MCA scattered infarct. No TNK given due to outside window.    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 Cardiomyopathy with low EF CT 11/30 no acute finding MRI 2/5 several small left MCA scattered acute and subacute infarcts CTA head and neck Severe stenosis of the left internal carotid artery just distal to the bifurcation with an angiographic string sign. 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 CUS 12/12 left ICA 80-99% stenosis 2D Echo 12/4 EF < 20% TEE 12/19 and 12/30 EF < 20% and no PFO EF 2/7 EF 20-25% LDL 43 HgbA1c 5.9 SCDs for VTE prophylaxis aspirin  81 mg daily prior to admission, now on aspirin  81 mg daily. Will add eliquis  for cardiomyopathy with low EF and stroke.  Follow up with cardiology, and repeat echo in 3 months, once EF more than 30%,  can d/c eliquis  and continue antiplatelet Ongoing aggressive stroke risk factor management  Left carotid stenosis CTA head and neck severe stenosis of the left internal carotid artery just distal to the bifurcation with an angiographic string sign.  CUS 12/12 left ICA 80-99% stenosis [facility] was contacted last admission in 02/2023 and plan to have intervention in a month with CTA head and neck Dr. 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 Follow up with cardiology, and repeat echo in 3 months, once EF more than 30%, can d/c eliquis  and continue antiplatelet  Delirium  Cognitive impairment Sundowning and inpt delirium Psych on board Management by primary team  Hypertension Stable BP goal 130-160 before carotid intervention Long term BP goal normotensive  Hyperlipidemia Home meds:  lipitor  80  LDL 43, goal < 70 Now on lipitor  80 Continue statin at discharge  Other Stroke Risk Factors Advanced age  Other Active Problems   Hospital day # 36  I discussed with Dr. Emeline and Dr. Magda. I spent extensive face-to-face time with the patient, more than 50% of which was spent in counseling and coordination of care, reviewing test results, images and medication, and discussing the diagnosis, treatment plan and potential prognosis. This patient's care requiresreview of multiple databases, neurological assessment, discussion with family, other specialists and medical decision making of high complexity.  Neurology will sign off. Please call with questions. Pt will follow up with stroke clinic NP at [facility] in about 4 weeks. Thanks for the  consult.    Ary Cummins, MD PhD Stroke Neurology 04/20/2023 4:26 PM    To contact Stroke Continuity provider, please refer  to [email or web address]. After hours, contact General Neurology

## 2023-04-20 NOTE — Progress Notes (Signed)
 PHARMACY - ANTICOAGULATION CONSULT NOTE  Pharmacy Consult:  Eliquis  Indication:  Cardiomyopathy with CVA  No Active Allergies  Patient Measurements: Height: 5' 8 (172.7 cm) Weight: 49.2 kg (108 lb 7.5 oz) IBW/kg (Calculated) : 68.4  Vital Signs: Temp: 98 F (36.7 C) (02/07 1424) Temp Source: Oral (02/07 1424) BP: 105/67 (02/07 1424) Pulse Rate: 61 (02/07 1424)  Labs: No results for input(s): HGB, HCT, PLT, APTT, LABPROT, INR, HEPARINUNFRC, HEPRLOWMOCWT, CREATININE, CKTOTAL, CKMB, TROPONINIHS in the last 72 hours.  Estimated Creatinine Clearance: 48.4 mL/min (by C-G formula based on SCr of 0.89 mg/dL).   Medical History: Past Medical History:  Diagnosis Date   Essential hypertension    HFrEF (heart failure with reduced ejection fraction) (HCC)    Mitral regurgitation     Assessment: 24 YOM s/p CABG, Impella and new CVA and cardiomyopathy with EF < 30%.  Pharmacy consulted to dose Eliquis .  CBC stable; no bleeding reported.  Goal of Therapy:  Appropriate anticoagulation Monitor platelets by anticoagulation protocol: Yes   Plan:  Start Eliquis  5mg  PO BID Monitor CBC and BMET PRN F/u with holding Eliquis  ~48 hrs prior to carotid endarterectomy tentatively planned for 04/25/23  Torii Royse D. Lendell, PharmD, BCPS, BCCCP 04/20/2023, 4:39 PM

## 2023-04-20 NOTE — Progress Notes (Signed)
 PROGRESS NOTE   Subjective/Complaints:  No acute complaints.  No events overnight.  Patient's son is with him today, working with therapies.  Patient endorses no specific complaints today.  Per PT, performed poorly on family training, son in agreement that patient cannot currently function in an independent level.  Planning to pursue SNF after discharge out-of-pocket until Medicaid kicks in; will discuss this with social work today.  Encouraged that this is a much more reasonable plan then discharge home without support.  Discussed with patient and father findings of CTA and echocardiogram today; awaiting recommendations from vascular regarding interventions; per neurology will need anticoagulation.  Patient willing to stay to complete workup and treatment.  Dr. Emi to discuss with patient and family when he is out of the OR today.   Multiple Bms overnight. PO intakes 100-50% every meal now, improving.    ROS: Denies fevers, chills,  abd pain, vomiting, diarrhea, SOB, cough, chest pain, new weakness or paraesthesias.   + confusion, ongoing  + anxiety--ongoing  Objective:   ECHOCARDIOGRAM COMPLETE Result Date: 04/20/2023    ECHOCARDIOGRAM REPORT   Patient Name:   GARNELL PHENIX Date of Exam: 04/20/2023 Medical Rec #:  [id number]     Height:       68.0 in Accession #:    [id number]    Weight:       108.5 lb Date of Birth:  13-Feb-1946     BSA:          1.576 m Patient Age:    78 years      BP:           120/62 mmHg Patient Gender: M             HR:           54 bpm. Exam Location:  Inpatient Procedure: 2D Echo, Color Doppler and Cardiac Doppler Indications:    Stroke I63.9  History:        Patient has prior history of Echocardiogram examinations, most                 recent 02/14/2023. HFrEF; Risk Factors:Hypertension.  Sonographer:    Tinnie Gosling [facility] Referring Phys: [id number] ARY XU IMPRESSIONS  1. Left ventricular ejection fraction, by  estimation, is 20 to 25%. The left ventricle has severely decreased function. The left ventricle demonstrates global hypokinesis with septal-lateral dyssynchrony consistent with IVCD. The left ventricular internal cavity size was mildly dilated. Left ventricular diastolic parameters are consistent with Grade II diastolic dysfunction (pseudonormalization).  2. Right ventricular systolic function is mildly reduced. The right ventricular size is normal. Tricuspid regurgitation signal is inadequate for assessing PA pressure.  3. Left atrial size was moderately dilated.  4. Right atrial size was mildly dilated.  5. The mitral valve is normal in structure. Mild mitral valve regurgitation. No evidence of mitral stenosis.  6. The aortic valve is tricuspid. Aortic valve regurgitation is not visualized. No aortic stenosis is present.  7. The inferior vena cava is normal in size with greater than 50% respiratory variability, suggesting right atrial pressure of 3 mmHg. FINDINGS  Left Ventricle: Left ventricular ejection fraction, by estimation, is 20  to 25%. The left ventricle has severely decreased function. The left ventricle demonstrates global hypokinesis. The left ventricular internal cavity size was mildly dilated. There is no left ventricular hypertrophy. Left ventricular diastolic parameters are consistent with Grade II diastolic dysfunction (pseudonormalization). Right Ventricle: The right ventricular size is normal. No increase in right ventricular wall thickness. Right ventricular systolic function is mildly reduced. Tricuspid regurgitation signal is inadequate for assessing PA pressure. Left Atrium: Left atrial size was moderately dilated. Right Atrium: Right atrial size was mildly dilated. Pericardium: Trivial pericardial effusion is present. Mitral Valve: The mitral valve is normal in structure. Mild mitral annular calcification. Mild mitral valve regurgitation. No evidence of mitral valve stenosis. Tricuspid  Valve: The tricuspid valve is normal in structure. Tricuspid valve regurgitation is trivial. Aortic Valve: The aortic valve is tricuspid. Aortic valve regurgitation is not visualized. No aortic stenosis is present. Pulmonic Valve: The pulmonic valve was normal in structure. Pulmonic valve regurgitation is not visualized. Aorta: The aortic root is normal in size and structure. Venous: The inferior vena cava is normal in size with greater than 50% respiratory variability, suggesting right atrial pressure of 3 mmHg. IAS/Shunts: No atrial level shunt detected by color flow Doppler.  LEFT VENTRICLE PLAX 2D LVIDd:         6.10 cm      Diastology LVIDs:         5.20 cm      LV e' medial:   6.41 cm/s LV PW:         0.90 cm      LV E/e' medial: 9.1 LV IVS:        0.90 cm LVOT diam:     2.00 cm LV SV:         35 LV SV Index:   23 LVOT Area:     3.14 cm  LV Volumes (MOD) LV vol d, MOD A2C: 200.0 ml LV vol d, MOD A4C: 166.0 ml LV vol s, MOD A2C: 113.0 ml LV vol s, MOD A4C: 124.0 ml LV SV MOD A2C:     87.0 ml LV SV MOD A4C:     166.0 ml LV SV MOD BP:      74.6 ml RIGHT VENTRICLE         IVC TAPSE (M-mode): 1.0 cm  IVC diam: 1.30 cm LEFT ATRIUM             Index        RIGHT ATRIUM           Index LA diam:        4.60 cm 2.92 cm/m   RA Area:     20.00 cm LA Vol (A2C):   76.3 ml 48.42 ml/m  RA Volume:   57.50 ml  36.49 ml/m LA Vol (A4C):   97.5 ml 61.88 ml/m LA Biplane Vol: 88.0 ml 55.85 ml/m  AORTIC VALVE LVOT Vmax:   58.10 cm/s LVOT Vmean:  40.900 cm/s LVOT VTI:    0.113 m  AORTA Ao Root diam: 3.50 cm Ao Asc diam:  3.20 cm MITRAL VALVE MV Area (PHT): 3.45 cm    SHUNTS MV Decel Time: 220 msec    Systemic VTI:  0.11 m MV E velocity: 58.30 cm/s  Systemic Diam: 2.00 cm MV A velocity: 56.10 cm/s MV E/A ratio:  1.04 Dalton McleanMD Electronically signed by Ezra Kanner Signature Date/Time: 04/20/2023/9:19:25 AM    Final    CT ANGIO HEAD NECK W WO CM Result Date: 04/19/2023  CLINICAL DATA:  Stroke/TIA EXAM: CT ANGIOGRAPHY  HEAD AND NECK WITH AND WITHOUT CONTRAST TECHNIQUE: Multidetector CT imaging of the head and neck was performed using the standard protocol during bolus administration of intravenous contrast. Multiplanar CT image reconstructions and MIPs were obtained to evaluate the vascular anatomy. Carotid stenosis measurements (when applicable) are obtained utilizing NASCET criteria, using the distal internal carotid diameter as the denominator. RADIATION DOSE REDUCTION: This exam was performed according to the departmental dose-optimization program which includes automated exposure control, adjustment of the mA and/or kV according to patient size and/or use of iterative reconstruction technique. CONTRAST:  75mL OMNIPAQUE  IOHEXOL  350 MG/ML SOLN COMPARISON:  None Available. FINDINGS: CT HEAD FINDINGS Brain: There is no mass, hemorrhage or extra-axial collection. The size and configuration of the ventricles and extra-axial CSF spaces are normal. There is hypoattenuation of the white matter, most commonly indicating chronic small vessel disease. Vascular: Atherosclerotic calcification of the internal carotid arteries at the skull base. No abnormal hyperdensity of the major intracranial arteries or dural venous sinuses. Skull: The visualized skull base, calvarium and extracranial soft tissues are normal. Sinuses/Orbits: No fluid levels or advanced mucosal thickening of the visualized paranasal sinuses. No mastoid or middle ear effusion. Normal orbits. CTA NECK FINDINGS Skeleton: Remote median sternotomy. Other neck: There is an incompletely visualized peripherally enhancing, tubular fluid collection extending from the anterior lower right neck into the anterior mediastinum, with a diameter of 12 mm. Upper chest: No pneumothorax or pleural effusion. No nodules or masses. Aortic arch: There is calcific atherosclerosis of the aortic arch. Conventional 3 vessel aortic branching pattern. RIGHT carotid system: There is mild  atherosclerotic calcification at the bifurcation and extending into the proximal ICA with less than 50% stenosis. LEFT carotid system: There is a large amount of mixed density plaque at the carotid bifurcation and extending into the internal carotid artery causing severe stenosis and angiographic string sign Vertebral arteries: Right dominant configuration. There is no dissection, occlusion or flow-limiting stenosis to the skull base (V1-V3 segments). CTA HEAD FINDINGS POSTERIOR CIRCULATION: Vertebral arteries are normal. No proximal occlusion of the anterior or inferior cerebellar arteries. Basilar artery is normal. Superior cerebellar arteries are normal. Posterior cerebral arteries are normal. ANTERIOR CIRCULATION: Atherosclerotic calcification of the internal carotid arteries at the skull base with severe stenosis of the left clinoid segment. Anterior cerebral arteries are normal. Middle cerebral arteries are normal. Venous sinuses: As permitted by contrast timing, patent. Anatomic variants: Patent right P-comm Review of the MIP images confirms the above findings. IMPRESSION: 1. No emergent large vessel occlusion. 2. 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 3. 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 4. Incompletely visualized peripherally enhancing, tubular structure extending from the anterior lower right neck into the anterior mediastinum, with a diameter of 12 mm. This is favored to be a thrombosed venous structure, possibly related to prior cardiothoracic surgery. An abscess or similar fluid collection is felt less likely. 5.  Aortic atherosclerosis (ICD10-I70.0). Electronically Signed   By: Franky Stanford M.D.   On: 04/19/2023 22:19     No results for input(s): WBC, HGB, HCT, PLT in the last 72 hours.      Recent Labs    04/17/23 1257  NA 133*  K 4.7  CL 98  CO2 25  GLUCOSE 94  BUN 22  CREATININE  0.89  CALCIUM  9.1      Intake/Output Summary (Last 24 hours) at  04/20/2023 1001 Last data filed at 04/20/2023 0719 Gross per 24 hour  Intake 250 ml  Output 950 ml  Net -700 ml      Pressure Injury 03/15/23 Coccyx Mid;Lower Stage 2 -  Partial thickness loss of dermis presenting as a shallow open injury with a red, pink wound bed without slough. (Active)  03/15/23 0800  Location: Coccyx  Location Orientation: Mid;Lower  Staging: Stage 2 -  Partial thickness loss of dermis presenting as a shallow open injury with a red, pink wound bed without slough.  Wound Description (Comments):   Present on Admission: Yes    Physical Exam: Vital Signs Blood pressure 120/62, pulse (!) 57, temperature 98 F (36.7 C), resp. rate 16, height 5' 8 (1.727 m), weight 49.2 kg, SpO2 95%.  Constitution: No apparent distress. Cachectic appearance. HENT: MMM.  No deformities. Resp:   clear to auscultation bilaterally.  On room air, no respiratory distress. CTAB Cardio: Bradycardic rate, reg rhythm.   Chronic bilateral lower extremity skin changes. No edema.  Capillary refill mildly delayed in bilateral hands but remains less than 5 seconds.  Bilateral lower extremities brisk.  Peripheral pulses palpable, 2+.  Abdomen: soft, Nondistended. Nontender,  +BS throughout, normoactive  Psych: Anxious mood.  Skin:   + Right inner thigh/calf with scabbing from vein harvest site - healed  + confluent erythema on extensor surfaces bilateral upper extremities--now extending more proximally to elbows, does appear slightly reduced  + Bilateral upper extremity bruising, unchanged  MSK: Severe lumbar levoscoliosis.  Right shoulder range of motion deficits ~90 degrees abduction.   Neurologic Exam:  Awake, alert, and oriented to self, place; not oriented to time - ongoing +  severe memory and higher cognitive deficits  - ongoing, worsening  No cranial nerve deficits.   Right upper extremity 5 - throughout, left  upper extremity 5 out of 5 throughout, bilateral lower extremities 5 out of 5 throughout. Sensation intact.  Slight right upper extremity ataxia on finger-to-nose. +resting tremor BL UE   Assessment/Plan: 1. Functional deficits which require 3+ hours per day of interdisciplinary therapy in a comprehensive inpatient rehab setting. Physiatrist is providing close team supervision and 24 hour management of active medical problems listed below. Physiatrist and rehab team continue to assess barriers to discharge/monitor patient progress toward functional and medical goals  Care Tool:  Bathing    Body parts bathed by patient: Right arm, Left arm, Chest, Abdomen, Front perineal area, Buttocks, Right upper leg, Left upper leg, Face, Right lower leg, Left lower leg   Body parts bathed by helper: Right lower leg, Left lower leg     Bathing assist Assist Level: Supervision/Verbal cueing     Upper Body Dressing/Undressing Upper body dressing   What is the patient wearing?: Pull over shirt    Upper body assist Assist Level: Supervision/Verbal cueing    Lower Body Dressing/Undressing Lower body dressing      What is the patient wearing?: Pants, Underwear/pull up     Lower body assist Assist for lower body dressing: Minimal Assistance - Patient > 75%     Toileting Toileting    Toileting assist Assist for toileting: Minimal Assistance - Patient > 75%     Transfers Chair/bed transfer  Transfers assist     Chair/bed transfer assist level: Contact Guard/Touching assist     Locomotion Ambulation   Ambulation assist      Assist level: Contact Guard/Touching assist Assistive device: Walker-rolling Max distance: 150'   Walk 10 feet activity  Assist  Walk 10 feet activity did not occur: Safety/medical concerns  Assist level: Contact Guard/Touching assist Assistive device: Walker-rolling   Walk 50 feet activity   Assist Walk 50 feet with 2 turns activity did not  occur: Safety/medical concerns  Assist level: Contact Guard/Touching assist Assistive device: Walker-rolling    Walk 150 feet activity   Assist Walk 150 feet activity did not occur: Safety/medical concerns         Walk 10 feet on uneven surface  activity   Assist Walk 10 feet on uneven surfaces activity did not occur: Safety/medical concerns         Wheelchair     Assist Is the patient using a wheelchair?: Yes Type of Wheelchair: Manual Wheelchair activity did not occur:  (Sternal precautions)  Wheelchair assist level: Dependent - Patient 0% Max wheelchair distance: 150'    Wheelchair 50 feet with 2 turns activity    Assist    Wheelchair 50 feet with 2 turns activity did not occur: Safety/medical concerns       Wheelchair 150 feet activity     Assist  Wheelchair 150 feet activity did not occur: Safety/medical concerns       Blood pressure 120/62, pulse (!) 57, temperature 98 F (36.7 C), resp. rate 16, height 5' 8 (1.727 m), weight 49.2 kg, SpO2 95%.  Medical Problem List and Plan: 1. 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  Sternal precautions             -patient may shower -ELOS/Goals: 14-18 days supervision to mod I-- Placement/TBD  - 1/23: Per patient/family request, pursuing SNF placement due to inability of caregivers to provide 24/7 support with ongoing min assist ADLs and cognitive deficits requiring assistance   -Stable to continue CIR 1/14: Needs to be independent at discharge per SW; son has moved out of state and can maybe provide 1-2 weeks on discharge. Patient's son's GF has provided some support in the hospital. Carryover very poor; Max-total assist with sternal precautions, Min A if able to push up. Difficulty with LBD and toiletting tasks - will forget what he is doing with toileting tasks. Per PT has been needing lots of help  at home too. Per SW presenting like dementia cog-wise. Doing well with Dys 3. Will need assistance at discharge; SW to discuss with family.  1-16: Discussed patient dispo requirements with son [name], emphasizing need for full supervision at discharge and anticipating will be greater than a 1 to 2 months needed.  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  After discussion with social work, family still unsure who will come in for training, will likely provide supervision for 2 weeks at home prior to deciding whether patient needs more oversight. 1/21: At bedtime incontinence per nursing. Per SW family not agreeing to care plan involving [name]/her mother, which was offered; this is consistent with patient's wishes as well. Unsure of dispo plan at this time as family is not offering alternatives.  1-22: Family discussed appealing discharge with social work due to feeling patient is not medically optimized.  Per team, primary barriers remain cognitive, not expected to improve further with additional time. 1/28: Per SLP, patient panicked with whole medications even though he complains of crushed; he prefers them to be dust. Geryl is coming next week. Upgraded to regular diet. Ongoing evening  behaviors.  2/4: SW has been unable to contact [facility] since last week. Awaiting medicaid approval. SW called daughter yesterday, she is skeptical that patient has cognitive deficits limiting independence. Per PT/OT supervision to CGA, with ongoing behavior issues. SLP supervision for swallowing - Mod A for basic tasks with SLP. Will make 15/7 due to limited gains.  2/5: attempted to call Geryl regarding update for MRI findings and psych consult; went to VM, message left that I will call back between 8-9 AM tomorrow 2-6: Discussed with [name], patient findings on MRI consistent with subacute and acute  strokes.  Undergoing neurology and vascular workup for possible interventions.  Medical hold on discharge until workup complete. 2/7: Vascular surgery Dr. Magda discussing with family possible inpatient CEA vs. TCAR early next week -- planning for CEA 7/12   2.  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  Venous Doppler studies negative--intermittently refusing daily Lovenox . - ambulation of 450 ft with close SPV yesterday; DC lovenox  2/4  -2-6: Starting DAPT per neurology/vascular due to stroke.  Pending echocardiogram, may need DOAC. -2-7: EF <30 %, will need AC per neurology, plan for Eliquis  starting tonight once confirmed by Neuro and will place hold day for pre-op per Vascular.    3. Pain Management: Oxycodone  as needed--no complaints  4. Mood/Behavior/Sleep: Provide emotional support             - antipsychotic agents: Seroquel  25 mg daily at 1000 and 50 mg nightly   - Continue current regimen, can add melatonin 5 mg as needed  -1-13: Will start to wean off antipsychotics; DC AM Seroquel  for tomorrow 1-15: Patient tolerated well, change nightly Seroquel  to as needed--has resulted in somewhat interrupted sleep but so far has been tolerable.  Alyse Domino states he gets anxious at night sometimes regarding dispo issues. 1/17: No PRNs used.  Continue melatonin, will add nightly as needed trazodone  50 mg retime at bedtime medications to 8 PM given this is his normal bedtime--improved 1-20: Family reporting compulsive calls, anxieties in the early evenings between therapies and sleep.  Patient is sleeping well, no hallucinations or signs of delirium at this time.  Likely early sundowning, adding Atarax  for anxiety and informed nursing to be aware of these behaviors. 04-03-21: No reports by nursing overnight, no use of as needed Atarax  1-24: Increasing anxiety and confused behavior since family elected going to SNF; encouraged use of PRN  Atarax , start BuSpar  5 mg twice daily 1-28: Adding mirtazapine  7.5 mg nightly for sleep, appetite stimulation, and ongoing anxiety reported by staff. 1/29: DC buspar  due to no effect, addition of mirtazepine.  1/30: DC mirtazepine due to persistent orthostasis  1/31: Endorses he slept well overnight, continue current management -04/14/23 endorses poor sleep but isn't really sure; monitor for now 2-3: Refused melatonin overnight.  Moved to as needed 2/7: Remains very anxious--with improvements in BP could start adding back standing medications, will do PRN atarax  for this weekend  5. Neuropsych/cognition: This patient is not capable of making decisions on his own behalf.  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  Weaning sedating medications as above.  Will start Aricept  5 mg nightly-theoretical risk of arrhythmia, however on literature review cardiac events matched placebo on meta-analysis. 1-15: Tolerating Aricept  well--and noticing some mild cognitive improvements 2-3: Psychiatry consulted for capacity evaluation, determined patient does not currently have capacity and cannot sign himself out AMA.  Appreciate their insiight and assitance.  2-4: Ordering MRI brain, labs including TSH, folate, B12, thiamine  for neurologic contributors to cognitive deficits.  Low suspicion of stroke given no gross motor or sensory deficits.  Patient has as needed Xanax  if needed for MRI. 2-5: TSH elevated, other labs stable.  Parameters showing subclinical hypothyroidism, will defer treatment for now.  MRI showing left corona radiata acute and subacute strokes.  See below.  6. Skin/Wound Care: Routine skin checks. -His right lower extremity harvest site completing course of Ancef  for cellulitis --1/8: transitioned to Augmentin  1-8 to complete 10-day course--appearance slightly more edematous/erythematous 1-14, will get  WBC in a.m. and monitor closely, still staying within margins of prior marking so may just be taking a long time to resolve 1-13: DC PICC line--site looks clean. -1-17: Right clavicle staples removed.  Remove abdominal sutures today. - 1/21: RLE swelling/infection resolved 1-22: Dry skin overlying prior infection site; add Eucerin daily--appearance improving 2/3: DC PIV   - 2/5: Confluent rash on BL UE this AM; add PRN benadryll - 2-6: Ongoing bilateral upper extremity erythema, nonpainful, does not look like a rash; mildly delayed capillary refill with good peripheral pulses, ?raynaud's per patient self endorsed history of similar events popping up and going within a few days. Continue to monitor. --seems improving 2/7   7. Fluids/Electrolytes/Nutrition/hyponatremia: Routine in and outs with follow-up chemistries, continue supplements.  -1-8: Low albumin , protein; continue current supplemental tube feeds as below.  Encourage protein and p.o. intakes.  Very mild hyponatremia, will recheck on Friday. 1-10: Hyponatremia 129, slight downtrend from 130.  Repeat BMP, serum osmol, urine studies in a.m. to confirm etiology--will be somewhat complicated by use of torsemide .  Placed on 1200 cc fluid restriction; patient is remaining well below this. -03/24/23 Na up to 132, SOsm WNL, urine studies not done yet -03/25/23 Urine Osm still not done... scheduled weekly labs starting Monday - 1/13: Na improved/stable - repeat Wednesday--add magnesium  to today's labs--2.1, stable 1-15: NA stable 131; add calcium  supplementation, PRN tums, and increase protonic to 40 mg BID for at bedtime nausea 1-17: Added as needed Zofran  4 mg every 8 hours, and miscellaneous nursing order to try and divide out medications and not give all at once crushed in applesauce due to intolerance of foul taste 1-20: Only 1 use of Zofran  over the weekend, no recorded emesis --still having nausea around crushed pills only -04/08/23 BMP done  yesterday afternoon, Na 132 stable, BUN 47 and Cr 1.44 which is up-- encourage PO fluids today, recheck tomorrow; will hold off on starting fluids just yet.  1-27: Sodium remains downtrending 129, BUN and creatinine slightly improved.  P.o. intakes appear low Is.  Start 500 cc IV fluids today, repeat in AM. 1-28: Sodium improved to 131 with IV fluids and DC torsemide .  Continue regular monitoring of sodium 1-30: Na back down to 128; DC BP medications, DC mirtazepine, start IVF NS 50 cc/hr for 1 day. CMP in AM. Now off confounding meds will plan for urine studies in AM to eval for SIADH 1-31: NA back to 131.  Unfortunately, still orthostatic, continue IV fluids.  Serum osmolality within normal limits, awaiting urine studies for full picture.-- UOsm 666 WNL, UNa 77--concentrating appropriately 2-3: Sodium improved 133 off of IV fluids.--Remaining stable 2-4  8.  Acute blood loss anemia.  Follow-up CBC -stable   - HgB improving/stable  9.  COVID-positive.  Completed Paxlovid   -1-17: DC schedule guaifenesin .  10.  Carotid artery stenosis.  Pre-CABG  Dopplers with 80 to 99% left ICA stenosis.  Seen by vascular surgery -- see #[id number]  11.  History of VT peri-CABG.  Amiodarone  200 mg BID>>daily, Lanoxin  0.125 mg daily  -Remains with low/stable heart rate -1-14: Patient reporting irregular palpitations during therapies.  None documented, made PT aware so they can document any events during their therapies today.--did not occur per therapies 1/14 1-29: Adjusting blood pressure medications as below; may need to adjust amiodarone  to 100 mg daily if no improvement with adjustment of diuretics--ongoing 1-31: Reached out to cardiology regarding ongoing orthostasis with multiple recent medication adjustments.  They have recommended discontinuation of Farxiga , as this can cause orthostasis, also possible contributed to hyperkalemia.  Will do this today, continue other medications at current dose 2-3: Vitals appear  improved.  Heart rate staying stable.   12.  Hyperlipidemia.  Lipitor  80mg  daily 13.  Diastolic congestive heart failure.  Torsemide  20 mg daily, Cozaar  25 mg daily, Aldactone  25 mg daily. Dapagliflozin  10mg  daily. Monitor for any signs of fluid overload.  Daily weights.  1-24: Weights with ongoing downtrend, per record patient has adequate p.o. intakes and compliant with nutrition shakes between meals, now with reports of orthostasis and blood pressure 80 over 50s this afternoon.  Reduce torsemide  to 10 mg daily, and encourage p.o. fluids. - Orthostatic vitals in AM ordered -1/25-26/25 wt fairly stable; monitor 1/27: Increasingly  low blood pressure, will DC torsemide  today and encourage p.o. fluids, initiate 500 cc IV fluids at 50 cc/h today.  BMP in AM. 1-28: Weights have stabilized today.  Continue monitoring for 1 to 2 days, then consider resumption of torsemide  at lower dose. 1/29: Worsening orthostasis despite holding diuretic torsemide , no peripheral signs of fluid overload, although weights have normalized at this point.  DC spironolactone  25 mg daily and Cozaar  25 mg daily, monitor closely.  Encourage p.o. fluids. 1/30: Continued orthostasis, weights stable and euvolemic; add IVF as above 1-31: Blood pressure mildly improved with IV fluid, but continues to be orthostatic.  Continue for today, continue to emphasize to patient encourage of p.o. fluid intake as well.  DC Farxiga  per cardiology as above. 2-3: Blood pressures improved since DC Farxiga .  Continue current management.--Continues losing weight daily 2-5: Weight stable/up 2-6: Weight stabilizing, blood pressure improving.  Due to recent strokes, will allow some higher blood pressures to maintain perfusion.  Still remains normovolemic.  Getting echocardiogram today. 2/7: Staying stable, no s/s volume overload, Echo today with EF 20-25% - improved from prior <20%. Would not overtreat HTN this weekend given significant challenges with  maintaining BP and L ICA stenosis/stroke.   Filed Weights   04/16/23 0533 04/17/23 0611 04/18/23 0600  Weight: 49.7 kg 48.3 kg 49.2 kg    14.  Sacral wound.  Wound care nurse follow-up.  Covered with foam dressing -1-16: Caught himself on the back during a toileting transfer today per nursing, resulting in small wound, covered in barrier dressing.  15.  Dysphagia/severe protein calorie malnutrition.  Dysphagia #2 thin liquids.   -Cortrak on intake; Continue supplemental tube feeds as above, encourage p.o. intakes, protein. -1-10: Eating 60 to 90% of meals per documentation.  Discontinue core track, tube feeds.--Continuing to eat well 1/28: P.o. intakes adequate per record, but cachectic appearance and continuing to lose weight.  Somewhat improved with adjustment of diuretics as above.  Will start mirtazapine  for appetite stimulation. 1-29: Dietary evaluated, continue current management with BID ensure shakes 1/30: Continues poor PO intakes despite considerable encouragement; unfortunately not  tolerating mirtazepine 1-31: Family bringing in food that he likes today.  Encouraged consumption of protein shakes, Magic cups between meals.   Has lost ~20 lbs since admission 1/8; some due to diuresis, eating 25-80% meals (avg 60%), continues to lose weight -  Nutrition onboard and assisting.    2-3: Weights continue to downtrend.  Increasing Ensure to 3 times daily between meals. DC Pepcid  due to patient refusal. Adding appetite stimulant marinol  2.5 mg BID today.   2-4: P.o. intakes appear improved since starting Marinol .  Did change Ensure to boost breeze per patient preference. 2-5: Weight stable/up for the first time today.  Ate 100% of breakfast and 80% of dinner yesterday.  Albumin  is uptrending per CMP.  Hopeful to start making gains in this area  2-6: Weights uptrending, eating better.  DC Marinol  due to slightly worsening confusion of the last few days.  2/7: Maintaining POS off marinol .  Continue encouragement.   16. Stool regimen: colace 200mg  daily  -1-17: DC liquid Colace, having regular bowel movements  -03/31/23 no BM in 3 days, will restart colace at 100mg  daily for now.  -04/01/23 pt states BM yesterday but none documented since 1/15, nurse documentation states LBM 1/16, unclear which is correct; asked nursing to please document if he's had BM today, if none today then would advance bowel regimen --1-20: Increase Colace to 100 mg twice daily, add Senokot S1 tab at night  -1/24 LBM  -04/07/23 no good BM since 1/21, just a smear on 1/24; hard palpable stool in lower abdomen, pt feels it in rectum; will do sorbitol  then mineral oil enema. Hopefully this improves it.  -04/08/23 multiple BMs yesterday with enema/meds, feels better; cont regimen -04/15/23 LBM 2d ago, cont regimen but if no BM by tomorrow, may want to add miralax  or increase senokot-- likely not eating enough to have daily BMs either...  2-3: Refusing stool softeners and Pepcid .  Move Senokot S to as needed  2/5: Last bowel movement recorded 1-31; patient reports bouts of diarrhea with protein shakes.  Will confirm with nursing today.  Given improved p.o. intakes, should start to pick up.  2-6: Medium bowel movement   17. Thrombocytosis.  resolved  18. 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 Seen on MRI 2/5 for cognitive work up. Likely etiology large vessel dz (L ICA stenosis above + post-op hypotension).  - Neurology consulted, appreciate recs, patient without acute neuro changes this AM.  - Son Geryl called, reviewed results, will d/w patient in AM with him over the phone.  2-6: Discussed findings with patient and his son [name] this a.m.  All questions answered.  Neurology and vascular surgery evaluating, getting echocardiogram and CTA today.  May need CEA more urgently than prior planned 1 month.  Appreciate neurology assistance and recommendations. -Appreciating some slight  weakness in right upper extremity and ataxia, which has not been seen on prior exams.  Cognition remains poor, but no other gross deficits. 2/7: Neurology recommending Eliquis  in addition to DAPT d/t EF <30% as above. Dr. Emi planning for L CEA 2/12. Maintain BP and monitor neuro status. - Son Geryl asking again why MRI was not done on admission since brought in with concerns for stroke; explained role of CT in acute settings, and the urgent nature of his cardiovascular intervention. Answered all questions. Geryl notes multiple instances prior to admission where patient acted drunk, ?TIAs.   LOS: 31 days A FACE TO FACE EVALUATION WAS PERFORMED  Joesph JAYSON Likes 04/20/2023, 10:01 AM

## 2023-04-20 NOTE — Progress Notes (Signed)
 Patient ID: MASIAH WOODY, male   DOB: 18-Nov-1945, 78 y.o.   MRN: [id number]  SW met with with pt and pt son [name] and discussed in length plan of care. Waiting on final decision about if there will be surgery. States will begin to explore SNF options as private pay as he understands barriers to getting SNF with insurance given his situation, and Medicaid application is pending.   *SW left SNF list in room based on insurance and Medicare.gov list.   [name], MSW, LCSW Office: [phone] Cell: [phone] Fax: [phone]

## 2023-04-20 NOTE — Plan of Care (Signed)
 Goals downgraded to reflect cognitive deficits that require minimal of CGA for safety   Problem: RH Bathing Goal: LTG Patient will bathe all body parts with assist levels (OT) Description: LTG: Patient will bathe all body parts with assist levels (OT) Flowsheets (Taken 04/20/2023 1034) LTG: Pt will perform bathing with assistance level/cueing: (downgraded 2/7- SD) Minimal Assistance - Patient > 75%   Problem: RH Balance Goal: LTG Patient will maintain dynamic standing with ADLs (OT) Description: LTG:  Patient will maintain dynamic standing balance with assist during activities of daily living (OT)  Flowsheets (Taken 04/20/2023 1034) LTG: Pt will maintain dynamic standing balance during ADLs with: (downgraded 2/7- SD) Contact Guard/Touching assist   Problem: RH Dressing Goal: LTG Patient will perform lower body dressing w/assist (OT) Description: LTG: Patient will perform lower body dressing with assist, with/without cues in positioning using equipment (OT) Flowsheets (Taken 04/20/2023 1034) LTG: Pt will perform lower body dressing with assistance level of: (downgraded 2/7- SD) Minimal Assistance - Patient > 75%   Problem: RH Toileting Goal: LTG Patient will perform toileting task (3/3 steps) with assistance level (OT) Description: LTG: Patient will perform toileting task (3/3 steps) with assistance level (OT)  Flowsheets (Taken 04/20/2023 1034) LTG: Pt will perform toileting task (3/3 steps) with assistance level: (downgraded 2/7- SD) Contact Guard/Touching assist   Problem: RH Toilet Transfers Goal: LTG Patient will perform toilet transfers w/assist (OT) Description: LTG: Patient will perform toilet transfers with assist, with/without cues using equipment (OT) Flowsheets (Taken 04/20/2023 1034) LTG: Pt will perform toilet transfers with assistance level of: (downgraded 2/7- SD) Contact Guard/Touching assist

## 2023-04-20 NOTE — Progress Notes (Signed)
 Occupational Therapy Weekly Progress Note  Patient Details  Name: Walter Vargas MRN: [id number] Date of Birth: December 08, 1945  Beginning of progress report period: 04/13/23 End of progress report period: 04/20/23   Patient has met 0 of 3 short term goals. [name] has made little progress in the last week with new findings of acute/subacute strokes. Functionally he has not necessarily been much worse physically but stagnant in his need for frequent cueing for sequencing during familiar ADL routine. He often requires as much as mod cueing for toileting tasks. His son attended family education today to get an idea of the type of care he will need in the event of a discharge home. He will required 24/7 supervision. He is a very high fall risk, unable to manage medication, simple meal planning, or even progress through a familiar ADL without someone present. Will await family's decision on discharge.   Patient continues to demonstrate the following deficits: muscle weakness, decreased cardiorespiratoy endurance, decreased coordination, decreased initiation, decreased attention, decreased awareness, decreased problem solving, decreased safety awareness, decreased memory, and delayed processing, and decreased standing balance, decreased postural control, and decreased balance strategies and therefore will continue to benefit from skilled OT intervention to enhance overall performance with BADL and Reduce care partner burden.  Patient not progressing toward long term goals.  See goal revision..  Plan of care revisions: Goals downgraded to CGA to reflect cognitive deficits that impact safety.  OT Short Term Goals Week 4:  OT Short Term Goal 1 (Week 4): Pt will complete toileting tasks with CGA OT Short Term Goal 1 - Progress (Week 4): Not met OT Short Term Goal 2 (Week 4): Pt will complete LB dressing with CGA OT Short Term Goal 2 - Progress (Week 4): Not met OT Short Term Goal 3 (Week 4): Pt will properly place UE  during sit <> stands with external cueing OT Short Term Goal 3 - Progress (Week 4): Not met Week 5:  OT Short Term Goal 1 (Week 5): STG= LTG in anticipation of SNF d/c  [name] 04/20/2023, 6:13 AM

## 2023-04-21 NOTE — Plan of Care (Signed)
   Problem: Consults Goal: RH GENERAL PATIENT EDUCATION Description: See Patient Education module for education specifics. Outcome: Progressing   Problem: RH BOWEL ELIMINATION Goal: RH STG MANAGE BOWEL WITH ASSISTANCE Description: STG Manage Bowel with toileting Assistance. Outcome: Progressing   Problem: RH BLADDER ELIMINATION Goal: RH STG MANAGE BLADDER WITH ASSISTANCE Description: STG Manage Bladder With toileting Assistance Outcome: Progressing

## 2023-04-21 NOTE — Progress Notes (Signed)
 Physical Therapy Session Note  Patient Details  Name: Walter Vargas MRN: [id number] Date of Birth: Apr 20, 1945  Today's Date: 04/21/2023 PT Individual Time: 0908-1004 PT Individual Time Calculation (min): 56 min   Short Term Goals: Week 3:  PT Short Term Goal 1 (Week 3): STGs = LTGs  Skilled Therapeutic Interventions/Progress Updates: Patient supine in bed on entrance to room. Patient alert and agreeable to PT session.   Patient reported unrated stinging pain in L elbow due to reports of injury. Pt reported that he fell while toileting, then stated he hit it on something while on the toilet. Attending nsg reported pt grazed L elbow on WC arm rest.  Therapeutic Activity: Bed Mobility: Pt performed supine<>sit on EOB with supervision. VC required for sequence. Pt sitting EOB donning B shoes with supervision and VC to increase L external rotation at hip to doff grip socks.  Transfers: Pt performed sit<>stand transfers throughout session with supervision and VC for hand placement.   Gait Training:  Pt ambulated from room<>day room gym using RW with light CGA. Pt required VC to maintain center positioning in RW and to increase safety awareness as pt bumped L forearm into door.   Therapeutic Exercise: Pt performed the following exercises with therapist providing the described cuing and facilitation for improvement. - Pt supine 90/90 with B LE's. Pt required tactile demonstration off sequence. Pt also required VC to lay LE onto mat when extending. Pt required light minA to maintain 90* of knee flexion on L LE vs R. 3 x 8 with rest break.  - Supine bridge with yellow theraband around B knees to increase glute activation and core stability. Pt required increased multimodal cuing for sequence and to maintain B feet on ground and to control eccentric. Pt also required cues to recall sequence (pt would raise B LE's off of mat vs glutes). - NuStep x 8 min with VC to maintain 50 spm. Pt performed 434  steps with on 1 level resistance to increase/maintain cardiovascular endurance.   Patient supine in bed at end of session with brakes locked, bed alarm set, and all needs within reach.      Therapy Documentation Precautions:  Precautions Precautions: Fall, Sternal, Other (comment) Precaution Booklet Issued: No Precaution Comments: cortrak, sternal precautions Restrictions Weight Bearing Restrictions Per Provider Order: No RUE Weight Bearing Per Provider Order: Weight bearing as tolerated LUE Weight Bearing Per Provider Order: Weight bearing as tolerated  Therapy/Group: Individual Therapy  Quantia Grullon PTA 04/21/2023, 12:24 PM

## 2023-04-21 NOTE — Progress Notes (Signed)
 Speech Language Pathology Daily Session Note  Patient Details  Name: Walter Vargas MRN: [id number] Date of Birth: 02-04-1946  Today's Date: 04/21/2023 SLP Individual Time: 8699-8675 SLP Individual Time Calculation (min): 24 min and Today's Date: 04/21/2023 SLP Missed Time: 21 Minutes Missed Time Reason: Patient unwilling to participate  Short Term Goals: Week 4: SLP Short Term Goal 1 (Week 4): Patient will utilize external memory aids to recall daily information with 80% accuracy given mod assist. SLP Short Term Goal 2 (Week 4): Patient will demonstrate intellectual awareness of deficits as evidenced by stating 2 cognitive and 2 physical deficits with max assist. SLP Short Term Goal 3 (Week 4): Patient will solve basic environmental problems with 90% accuracy given mod assist.  Skilled Therapeutic Interventions:   Pt seen for skilled ST with focus on cognitive communication goals, pt in bed finishing lunch meal and reluctantly agreeable to therapeutic tasks. SLP re-educating patient on role of SLP and goals of care, required reminding of primary therapists name. Pt then began to state I just don't want to talk to day in between verbose, tangential speech. SLP providing redirection for functional tasks including orientation concepts with use of white board and paper calendar. Pt demonstrates decreased insight into current status, when asked why he was in the hospital he reports because my son had a heart problem. SLP attempting to engage patient with use of memory notebook however patient became agitated with therapist stating I said I don't want to talk today. I don't like it when my normal girl is here. You all drive me crazy. Session ended at this time d/t pt agitated and decreased willingness to participate. Pt left in bed with alarm set, all needs within reach and door left ajar. Cont ST POC.   Pain Pain Assessment Pain Scale: 0-10 Pain Score: 0-No pain  Therapy/Group: Individual  Therapy  Yocheved Depner R Solangel Mcmanaway 04/21/2023, 1:23 PM

## 2023-04-21 NOTE — Progress Notes (Signed)
 PROGRESS NOTE   Subjective/Complaints:  Pt doing well, working with PT. Slept ok, pain well managed, LBM 2 days ago, urinating fine. Denies any other complaints or concerns.    ROS: Denies fevers, chills,  abd pain, vomiting, diarrhea, SOB, cough, chest pain, new weakness or paraesthesias.   + confusion, ongoing  + anxiety--ongoing  Objective:   ECHOCARDIOGRAM COMPLETE Result Date: 04/20/2023    ECHOCARDIOGRAM REPORT   Patient Name:   Walter Vargas Date of Exam: 04/20/2023 Medical Rec #:  [id number]     Height:       68.0 in Accession #:    [id number]    Weight:       108.5 lb Date of Birth:  10/08/1945     BSA:          1.576 m Patient Age:    78 years      BP:           120/62 mmHg Patient Gender: M             HR:           54 bpm. Exam Location:  Inpatient Procedure: 2D Echo, Color Doppler and Cardiac Doppler Indications:    Stroke I63.9  History:        Patient has prior history of Echocardiogram examinations, most                 recent 02/14/2023. HFrEF; Risk Factors:Hypertension.  Sonographer:    Tinnie Gosling [facility] Referring Phys: [id number] ARY XU IMPRESSIONS  1. Left ventricular ejection fraction, by estimation, is 20 to 25%. The left ventricle has severely decreased function. The left ventricle demonstrates global hypokinesis with septal-lateral dyssynchrony consistent with IVCD. The left ventricular internal cavity size was mildly dilated. Left ventricular diastolic parameters are consistent with Grade II diastolic dysfunction (pseudonormalization).  2. Right ventricular systolic function is mildly reduced. The right ventricular size is normal. Tricuspid regurgitation signal is inadequate for assessing PA pressure.  3. Left atrial size was moderately dilated.  4. Right atrial size was mildly dilated.  5. The mitral valve is normal in structure. Mild mitral valve regurgitation. No evidence of mitral stenosis.  6. The aortic valve is  tricuspid. Aortic valve regurgitation is not visualized. No aortic stenosis is present.  7. The inferior vena cava is normal in size with greater than 50% respiratory variability, suggesting right atrial pressure of 3 mmHg. FINDINGS  Left Ventricle: Left ventricular ejection fraction, by estimation, is 20 to 25%. The left ventricle has severely decreased function. The left ventricle demonstrates global hypokinesis. The left ventricular internal cavity size was mildly dilated. There is no left ventricular hypertrophy. Left ventricular diastolic parameters are consistent with Grade II diastolic dysfunction (pseudonormalization). Right Ventricle: The right ventricular size is normal. No increase in right ventricular wall thickness. Right ventricular systolic function is mildly reduced. Tricuspid regurgitation signal is inadequate for assessing PA pressure. Left Atrium: Left atrial size was moderately dilated. Right Atrium: Right atrial size was mildly dilated. Pericardium: Trivial pericardial effusion is present. Mitral Valve: The mitral valve is normal in structure. Mild mitral annular calcification. Mild mitral valve regurgitation. No evidence of mitral valve  stenosis. Tricuspid Valve: The tricuspid valve is normal in structure. Tricuspid valve regurgitation is trivial. Aortic Valve: The aortic valve is tricuspid. Aortic valve regurgitation is not visualized. No aortic stenosis is present. Pulmonic Valve: The pulmonic valve was normal in structure. Pulmonic valve regurgitation is not visualized. Aorta: The aortic root is normal in size and structure. Venous: The inferior vena cava is normal in size with greater than 50% respiratory variability, suggesting right atrial pressure of 3 mmHg. IAS/Shunts: No atrial level shunt detected by color flow Doppler.  LEFT VENTRICLE PLAX 2D LVIDd:         6.10 cm      Diastology LVIDs:         5.20 cm      LV e' medial:   6.41 cm/s LV PW:         0.90 cm      LV E/e' medial: 9.1 LV  IVS:        0.90 cm LVOT diam:     2.00 cm LV SV:         35 LV SV Index:   23 LVOT Area:     3.14 cm  LV Volumes (MOD) LV vol d, MOD A2C: 200.0 ml LV vol d, MOD A4C: 166.0 ml LV vol s, MOD A2C: 113.0 ml LV vol s, MOD A4C: 124.0 ml LV SV MOD A2C:     87.0 ml LV SV MOD A4C:     166.0 ml LV SV MOD BP:      74.6 ml RIGHT VENTRICLE         IVC TAPSE (M-mode): 1.0 cm  IVC diam: 1.30 cm LEFT ATRIUM             Index        RIGHT ATRIUM           Index LA diam:        4.60 cm 2.92 cm/m   RA Area:     20.00 cm LA Vol (A2C):   76.3 ml 48.42 ml/m  RA Volume:   57.50 ml  36.49 ml/m LA Vol (A4C):   97.5 ml 61.88 ml/m LA Biplane Vol: 88.0 ml 55.85 ml/m  AORTIC VALVE LVOT Vmax:   58.10 cm/s LVOT Vmean:  40.900 cm/s LVOT VTI:    0.113 m  AORTA Ao Root diam: 3.50 cm Ao Asc diam:  3.20 cm MITRAL VALVE MV Area (PHT): 3.45 cm    SHUNTS MV Decel Time: 220 msec    Systemic VTI:  0.11 m MV E velocity: 58.30 cm/s  Systemic Diam: 2.00 cm MV A velocity: 56.10 cm/s MV E/A ratio:  1.04 Dalton McleanMD Electronically signed by Ezra Kanner Signature Date/Time: 04/20/2023/9:19:25 AM    Final    CT ANGIO HEAD NECK W WO CM Result Date: 04/19/2023 CLINICAL DATA:  Stroke/TIA EXAM: CT ANGIOGRAPHY HEAD AND NECK WITH AND WITHOUT CONTRAST TECHNIQUE: Multidetector CT imaging of the head and neck was performed using the standard protocol during bolus administration of intravenous contrast. Multiplanar CT image reconstructions and MIPs were obtained to evaluate the vascular anatomy. Carotid stenosis measurements (when applicable) are obtained utilizing NASCET criteria, using the distal internal carotid diameter as the denominator. RADIATION DOSE REDUCTION: This exam was performed according to the departmental dose-optimization program which includes automated exposure control, adjustment of the mA and/or kV according to patient size and/or use of iterative reconstruction technique. CONTRAST:  75mL OMNIPAQUE  IOHEXOL  350 MG/ML SOLN COMPARISON:   None Available. FINDINGS: CT HEAD  FINDINGS Brain: There is no mass, hemorrhage or extra-axial collection. The size and configuration of the ventricles and extra-axial CSF spaces are normal. There is hypoattenuation of the white matter, most commonly indicating chronic small vessel disease. Vascular: Atherosclerotic calcification of the internal carotid arteries at the skull base. No abnormal hyperdensity of the major intracranial arteries or dural venous sinuses. Skull: The visualized skull base, calvarium and extracranial soft tissues are normal. Sinuses/Orbits: No fluid levels or advanced mucosal thickening of the visualized paranasal sinuses. No mastoid or middle ear effusion. Normal orbits. CTA NECK FINDINGS Skeleton: Remote median sternotomy. Other neck: There is an incompletely visualized peripherally enhancing, tubular fluid collection extending from the anterior lower right neck into the anterior mediastinum, with a diameter of 12 mm. Upper chest: No pneumothorax or pleural effusion. No nodules or masses. Aortic arch: There is calcific atherosclerosis of the aortic arch. Conventional 3 vessel aortic branching pattern. RIGHT carotid system: There is mild atherosclerotic calcification at the bifurcation and extending into the proximal ICA with less than 50% stenosis. LEFT carotid system: There is a large amount of mixed density plaque at the carotid bifurcation and extending into the internal carotid artery causing severe stenosis and angiographic string sign Vertebral arteries: Right dominant configuration. There is no dissection, occlusion or flow-limiting stenosis to the skull base (V1-V3 segments). CTA HEAD FINDINGS POSTERIOR CIRCULATION: Vertebral arteries are normal. No proximal occlusion of the anterior or inferior cerebellar arteries. Basilar artery is normal. Superior cerebellar arteries are normal. Posterior cerebral arteries are normal. ANTERIOR CIRCULATION: Atherosclerotic calcification of the  internal carotid arteries at the skull base with severe stenosis of the left clinoid segment. Anterior cerebral arteries are normal. Middle cerebral arteries are normal. Venous sinuses: As permitted by contrast timing, patent. Anatomic variants: Patent right P-comm Review of the MIP images confirms the above findings. IMPRESSION: 1. No emergent large vessel occlusion. 2. 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 3. 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 4. Incompletely visualized peripherally enhancing, tubular structure extending from the anterior lower right neck into the anterior mediastinum, with a diameter of 12 mm. This is favored to be a thrombosed venous structure, possibly related to prior cardiothoracic surgery. An abscess or similar fluid collection is felt less likely. 5.  Aortic atherosclerosis (ICD10-I70.0). Electronically Signed   By: Franky Stanford M.D.   On: 04/19/2023 22:19     No results for input(s): WBC, HGB, HCT, PLT in the last 72 hours.      No results for input(s): NA, K, CL, CO2, GLUCOSE, BUN, CREATININE, CALCIUM  in the last 72 hours.     Intake/Output Summary (Last 24 hours) at 04/21/2023 1237 Last data filed at 04/20/2023 1808 Gross per 24 hour  Intake 360 ml  Output --  Net 360 ml      Pressure Injury 03/15/23 Coccyx Mid;Lower Stage 2 -  Partial thickness loss of dermis presenting as a shallow open injury with a red, pink wound bed without slough. (Active)  03/15/23 0800  Location: Coccyx  Location Orientation: Mid;Lower  Staging: Stage 2 -  Partial thickness loss of dermis presenting as a shallow open injury with a red, pink wound bed without slough.  Wound Description (Comments):   Present on Admission: Yes    Physical Exam: Vital Signs Blood pressure (!) 112/53, pulse (!) 56, temperature 98.7 F (37.1 C), resp. rate 17, height 5' 8 (1.727 m),  weight 50.5 kg,  SpO2 100%.  Constitution: No apparent distress. Cachectic appearance. Up walking with walker HENT: MMM.  No deformities. Resp:   clear to auscultation bilaterally.  On room air, no respiratory distress. CTAB Cardio: Bradycardic rate, reg rhythm.   Chronic bilateral lower extremity skin changes. No edema.  Capillary refill mildly delayed in bilateral hands but remains less than 5 seconds.  Bilateral lower extremities brisk.  Peripheral pulses palpable, 2+.  Abdomen: soft, Nondistended. Nontender,  +BS throughout, normoactive  Psych: Anxious mood. Not perseverating on anything today.  Neuro: a bit less confused than prior, seems cognitively better this weekend.  Skin:   + Right inner thigh/calf with scabbing from vein harvest site - healed  + confluent erythema on extensor surfaces bilateral upper extremities--now extending more proximally to elbows, does appear slightly reduced-- not noted 2/8  + Bilateral upper extremity bruising, unchanged  PRIOR EXAMS: MSK: Severe lumbar levoscoliosis.  Right shoulder range of motion deficits ~90 degrees abduction.   Neurologic Exam:  Awake, alert, and oriented to self, place; not oriented to time - ongoing +  severe memory and higher cognitive deficits  - ongoing, worsening  No cranial nerve deficits.   Right upper extremity 5 - throughout, left upper extremity 5 out of 5 throughout, bilateral lower extremities 5 out of 5 throughout. Sensation intact.  Slight right upper extremity ataxia on finger-to-nose. +resting tremor BL UE   Assessment/Plan: 1. Functional deficits which require 3+ hours per day of interdisciplinary therapy in a comprehensive inpatient rehab setting. Physiatrist is providing close team supervision and 24 hour management of active medical problems listed below. Physiatrist and rehab team continue to assess barriers to discharge/monitor patient progress toward functional and medical goals  Care  Tool:  Bathing    Body parts bathed by patient: Right arm, Left arm, Chest, Abdomen, Front perineal area, Buttocks, Right upper leg, Left upper leg, Face, Right lower leg, Left lower leg   Body parts bathed by helper: Right lower leg, Left lower leg     Bathing assist Assist Level: Supervision/Verbal cueing     Upper Body Dressing/Undressing Upper body dressing   What is the patient wearing?: Pull over shirt    Upper body assist Assist Level: Supervision/Verbal cueing    Lower Body Dressing/Undressing Lower body dressing      What is the patient wearing?: Pants, Underwear/pull up     Lower body assist Assist for lower body dressing: Minimal Assistance - Patient > 75%     Toileting Toileting    Toileting assist Assist for toileting: Minimal Assistance - Patient > 75%     Transfers Chair/bed transfer  Transfers assist     Chair/bed transfer assist level: Contact Guard/Touching assist     Locomotion Ambulation   Ambulation assist      Assist level: Contact Guard/Touching assist Assistive device: Walker-rolling Max distance: 150'   Walk 10 feet activity   Assist  Walk 10 feet activity did not occur: Safety/medical concerns  Assist level: Contact Guard/Touching assist Assistive device: Walker-rolling   Walk 50 feet activity   Assist Walk 50 feet with 2 turns activity did not occur: Safety/medical concerns  Assist level: Contact Guard/Touching assist Assistive device: Walker-rolling    Walk 150 feet activity   Assist Walk 150 feet activity did not occur: Safety/medical concerns         Walk 10 feet on uneven surface  activity   Assist Walk 10 feet on uneven surfaces activity did not occur: Safety/medical concerns  Wheelchair     Assist Is the patient using a wheelchair?: Yes Type of Wheelchair: Manual Wheelchair activity did not occur:  (Sternal precautions)  Wheelchair assist level: Dependent - Patient 0% Max  wheelchair distance: 150'    Wheelchair 50 feet with 2 turns activity    Assist    Wheelchair 50 feet with 2 turns activity did not occur: Safety/medical concerns       Wheelchair 150 feet activity     Assist  Wheelchair 150 feet activity did not occur: Safety/medical concerns       Blood pressure (!) 112/53, pulse (!) 56, temperature 98.7 F (37.1 C), resp. rate 17, height 5' 8 (1.727 m), weight 50.5 kg, SpO2 100%.  Medical Problem List and Plan: 1. 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  Sternal precautions             -patient may shower -ELOS/Goals: 14-18 days supervision to mod I-- Placement/TBD  - 1/23: Per patient/family request, pursuing SNF placement due to inability of caregivers to provide 24/7 support with ongoing min assist ADLs and cognitive deficits requiring assistance   -Stable to continue CIR 1/14: Needs to be independent at discharge per SW; son has moved out of state and can maybe provide 1-2 weeks on discharge. Patient's son's GF has provided some support in the hospital. Carryover very poor; Max-total assist with sternal precautions, Min A if able to push up. Difficulty with LBD and toiletting tasks - will forget what he is doing with toileting tasks. Per PT has been needing lots of help at home too. Per SW presenting like dementia cog-wise. Doing well with Dys 3. Will need assistance at discharge; SW to discuss with family.  1-16: Discussed patient dispo requirements with son [name], emphasizing need for full supervision at discharge and anticipating will be greater than a 1 to 2 months needed.  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  After discussion with social work, family still unsure who will come in for training, will likely  provide supervision for 2 weeks at home prior to deciding whether patient needs more oversight. 1/21: At bedtime incontinence per nursing. Per SW family not agreeing to care plan involving [name]/her mother, which was offered; this is consistent with patient's wishes as well. Unsure of dispo plan at this time as family is not offering alternatives.  1-22: Family discussed appealing discharge with social work due to feeling patient is not medically optimized.  Per team, primary barriers remain cognitive, not expected to improve further with additional time. 1/28: Per SLP, patient panicked with whole medications even though he complains of crushed; he prefers them to be dust. Geryl is coming next week. Upgraded to regular diet. Ongoing evening behaviors.  2/4: SW has been unable to contact [facility] since last week. Awaiting medicaid approval. SW called daughter yesterday, she is skeptical that patient has cognitive deficits limiting independence. Per PT/OT supervision to CGA, with ongoing behavior issues. SLP supervision for swallowing - Mod A for basic tasks with SLP. Will make 15/7 due to limited gains.  2/5: attempted to call Geryl regarding update for MRI findings and psych consult; went to VM, message left that I will call back between 8-9 AM tomorrow 2-6: Discussed with [name], patient findings on MRI consistent with subacute and acute strokes.  Undergoing neurology and vascular workup for  possible interventions.  Medical hold on discharge until workup complete. 2/7: Vascular surgery Dr. Magda discussing with family possible inpatient CEA vs. TCAR early next week -- planning for CEA 7/12   2.  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  Venous Doppler studies negative--intermittently refusing daily Lovenox . - ambulation of 450 ft with close SPV yesterday; DC lovenox  2/4  -2-6: Starting DAPT per neurology/vascular due to stroke.  Pending  echocardiogram, may need DOAC. -2-7: EF <30 %, will need AC per neurology, plan for Eliquis  starting tonight once confirmed by Neuro and will place hold day for pre-op per Vascular.    3. Pain Management: Oxycodone  as needed--no complaints  4. Mood/Behavior/Sleep: Provide emotional support             - antipsychotic agents: Seroquel  25 mg daily at 1000 and 50 mg nightly   - Continue current regimen, can add melatonin 5 mg as needed  -1-13: Will start to wean off antipsychotics; DC AM Seroquel  for tomorrow 1-15: Patient tolerated well, change nightly Seroquel  to as needed--has resulted in somewhat interrupted sleep but so far has been tolerable.  Alyse Domino states he gets anxious at night sometimes regarding dispo issues. 1/17: No PRNs used.  Continue melatonin, will add nightly as needed trazodone  50 mg retime at bedtime medications to 8 PM given this is his normal bedtime--improved 1-20: Family reporting compulsive calls, anxieties in the early evenings between therapies and sleep.  Patient is sleeping well, no hallucinations or signs of delirium at this time.  Likely early sundowning, adding Atarax  for anxiety and informed nursing to be aware of these behaviors. 04-03-21: No reports by nursing overnight, no use of as needed Atarax  1-24: Increasing anxiety and confused behavior since family elected going to SNF; encouraged use of PRN Atarax , start BuSpar  5 mg twice daily 1-28: Adding mirtazapine  7.5 mg nightly for sleep, appetite stimulation, and ongoing anxiety reported by staff. 1/29: DC buspar  due to no effect, addition of mirtazepine.  1/30: DC mirtazepine due to persistent orthostasis  1/31: Endorses he slept well overnight, continue current management -04/14/23 endorses poor sleep but isn't really sure; monitor for now 2-3: Refused melatonin overnight.  Moved to as needed 2/7: Remains very anxious--with improvements in BP could start adding back standing medications, will do PRN atarax   for this weekend  5. Neuropsych/cognition: This patient is not capable of making decisions on his own behalf.  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  Weaning sedating medications as above.  Will start Aricept  5 mg nightly-theoretical risk of arrhythmia, however on literature review cardiac events matched placebo on meta-analysis. 1-15: Tolerating Aricept  well--and noticing some mild cognitive improvements 2-3: Psychiatry consulted for capacity evaluation, determined patient does not currently have capacity and cannot sign himself out AMA. Appreciate their insiight and assitance.  2-4: Ordering MRI brain, labs including TSH, folate, B12, thiamine  for neurologic contributors to cognitive deficits.  Low suspicion of stroke given no gross motor or sensory deficits.  Patient has as needed Xanax  if needed for MRI. 2-5: TSH elevated, other labs stable.  Parameters showing subclinical hypothyroidism, will defer treatment for now.  MRI showing left corona radiata acute and subacute strokes.  See below.  6. Skin/Wound Care: Routine skin checks. -His right lower extremity harvest site completing course of Ancef  for cellulitis --1/8: transitioned to Augmentin  1-8 to complete 10-day course--appearance slightly more edematous/erythematous 1-14, will get  WBC in a.m. and monitor closely, still staying within margins of prior marking so may just be taking a long time to resolve 1-13: DC PICC line--site looks clean. -1-17: Right clavicle staples removed.  Remove abdominal sutures today. - 1/21: RLE swelling/infection resolved 1-22: Dry skin overlying prior infection site; add Eucerin daily--appearance improving 2/3: DC PIV   - 2/5: Confluent rash on BL UE this AM; add PRN benadryll - 2-6: Ongoing bilateral upper extremity erythema, nonpainful, does not look like a rash; mildly delayed capillary refill with good  peripheral pulses, ?raynaud's per patient self endorsed history of similar events popping up and going within a few days. Continue to monitor. --seems improving 2/7   7. Fluids/Electrolytes/Nutrition/hyponatremia: Routine in and outs with follow-up chemistries, continue supplements.  -1-8: Low albumin , protein; continue current supplemental tube feeds as below.  Encourage protein and p.o. intakes.  Very mild hyponatremia, will recheck on Friday. 1-10: Hyponatremia 129, slight downtrend from 130.  Repeat BMP, serum osmol, urine studies in a.m. to confirm etiology--will be somewhat complicated by use of torsemide .  Placed on 1200 cc fluid restriction; patient is remaining well below this. -03/24/23 Na up to 132, SOsm WNL, urine studies not done yet -03/25/23 Urine Osm still not done... scheduled weekly labs starting Monday - 1/13: Na improved/stable - repeat Wednesday--add magnesium  to today's labs--2.1, stable 1-15: NA stable 131; add calcium  supplementation, PRN tums, and increase protonic to 40 mg BID for at bedtime nausea 1-17: Added as needed Zofran  4 mg every 8 hours, and miscellaneous nursing order to try and divide out medications and not give all at once crushed in applesauce due to intolerance of foul taste 1-20: Only 1 use of Zofran  over the weekend, no recorded emesis --still having nausea around crushed pills only -04/08/23 BMP done yesterday afternoon, Na 132 stable, BUN 47 and Cr 1.44 which is up-- encourage PO fluids today, recheck tomorrow; will hold off on starting fluids just yet.  1-27: Sodium remains downtrending 129, BUN and creatinine slightly improved.  P.o. intakes appear low Is.  Start 500 cc IV fluids today, repeat in AM. 1-28: Sodium improved to 131 with IV fluids and DC torsemide .  Continue regular monitoring of sodium 1-30: Na back down to 128; DC BP medications, DC mirtazepine, start IVF NS 50 cc/hr for 1 day. CMP in AM. Now off confounding meds will plan for urine studies  in AM to eval for SIADH 1-31: NA back to 131.  Unfortunately, still orthostatic, continue IV fluids.  Serum osmolality within normal limits, awaiting urine studies for full picture.-- UOsm 666 WNL, UNa 77--concentrating appropriately 2-3: Sodium improved 133 off of IV fluids.--Remaining stable 2-4  8.  Acute blood loss anemia.  Follow-up CBC -stable   - HgB improving/stable  9.  COVID-positive.  Completed Paxlovid   -1-17: DC schedule guaifenesin .  10.  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  Seen by vascular surgery -- see #18  11.  History of VT peri-CABG.  Amiodarone  200 mg BID>>daily, Lanoxin  0.125 mg daily  -Remains with low/stable heart rate -1-14: Patient reporting irregular palpitations during therapies.  None documented, made PT aware so they can document any events during their therapies today.--did not occur per therapies 1/14 1-29: Adjusting blood pressure medications as below; may need to adjust amiodarone  to 100 mg daily if no improvement with adjustment of diuretics--ongoing 1-31: Reached out to cardiology regarding ongoing orthostasis with multiple recent medication adjustments.  They have recommended discontinuation  of Farxiga , as this can cause orthostasis, also possible contributed to hyperkalemia.  Will do this today, continue other medications at current dose 2-3: Vitals appear improved.  Heart rate staying stable.   12.  Hyperlipidemia.  Lipitor  80mg  daily 13.  Diastolic congestive heart failure.  Torsemide  20 mg daily, Cozaar  25 mg daily, Aldactone  25 mg daily. Dapagliflozin  10mg  daily. Monitor for any signs of fluid overload.  Daily weights.  1-24: Weights with ongoing downtrend, per record patient has adequate p.o. intakes and compliant with nutrition shakes between meals, now with reports of orthostasis and blood pressure 80 over 50s this afternoon.  Reduce torsemide  to 10 mg daily, and encourage p.o. fluids. - Orthostatic vitals  in AM ordered -1/25-26/25 wt fairly stable; monitor 1/27: Increasingly  low blood pressure, will DC torsemide  today and encourage p.o. fluids, initiate 500 cc IV fluids at 50 cc/h today.  BMP in AM. 1-28: Weights have stabilized today.  Continue monitoring for 1 to 2 days, then consider resumption of torsemide  at lower dose. 1/29: Worsening orthostasis despite holding diuretic torsemide , no peripheral signs of fluid overload, although weights have normalized at this point.  DC spironolactone  25 mg daily and Cozaar  25 mg daily, monitor closely.  Encourage p.o. fluids. 1/30: Continued orthostasis, weights stable and euvolemic; add IVF as above 1-31: Blood pressure mildly improved with IV fluid, but continues to be orthostatic.  Continue for today, continue to emphasize to patient encourage of p.o. fluid intake as well.  DC Farxiga  per cardiology as above. 2-3: Blood pressures improved since DC Farxiga .  Continue current management.--Continues losing weight daily 2-5: Weight stable/up 2-6: Weight stabilizing, blood pressure improving.  Due to recent strokes, will allow some higher blood pressures to maintain perfusion.  Still remains normovolemic.  Getting echocardiogram today. 2/7: Staying stable, no s/s volume overload, Echo today with EF 20-25% - improved from prior <20%. Would not overtreat HTN this weekend given significant challenges with maintaining BP and L ICA stenosis/stroke.   -04/21/23 BP fine, wt up but looks good, monitor.  Filed Weights   04/17/23 0611 04/18/23 0600 04/21/23 0500  Weight: 48.3 kg 49.2 kg 50.5 kg   Vitals:   04/18/23 0409 04/18/23 1306 04/18/23 1309 04/18/23 1927  BP: (!) 118/57 117/61 (!) 124/90 126/61   04/19/23 0501 04/19/23 0942 04/19/23 1422 04/19/23 1948  BP: (!) 112/50 (!) 108/58 107/86 (!) 110/58   04/20/23 0523 04/20/23 1424 04/20/23 2048 04/21/23 0519  BP: 120/62 105/67 (!) 113/56 (!) 112/53    14.  Sacral wound.  Wound care nurse follow-up.  Covered with  foam dressing -1-16: Caught himself on the back during a toileting transfer today per nursing, resulting in small wound, covered in barrier dressing.  15.  Dysphagia/severe protein calorie malnutrition.  Dysphagia #2 thin liquids.   -Cortrak on intake; Continue supplemental tube feeds as above, encourage p.o. intakes, protein. -1-10: Eating 60 to 90% of meals per documentation.  Discontinue core track, tube feeds.--Continuing to eat well 1/28: P.o. intakes adequate per record, but cachectic appearance and continuing to lose weight.  Somewhat improved with adjustment of diuretics as above.  Will start mirtazapine  for appetite stimulation. 1-29: Dietary evaluated, continue current management with BID ensure shakes 1/30: Continues poor PO intakes despite considerable encouragement; unfortunately not tolerating mirtazepine 1-31: Family bringing in food that he likes today.  Encouraged consumption of protein shakes, Magic cups between meals.   Has lost ~20 lbs since admission 1/8; some due to diuresis, eating 25-80% meals (avg 60%),  continues to lose weight -  Nutrition onboard and assisting.    2-3: Weights continue to downtrend.  Increasing Ensure to 3 times daily between meals. DC Pepcid  due to patient refusal. Adding appetite stimulant marinol  2.5 mg BID today.   2-4: P.o. intakes appear improved since starting Marinol .  Did change Ensure to boost breeze per patient preference. 2-5: Weight stable/up for the first time today.  Ate 100% of breakfast and 80% of dinner yesterday.  Albumin  is uptrending per CMP.  Hopeful to start making gains in this area  2-6: Weights uptrending, eating better.  DC Marinol  due to slightly worsening confusion of the last few days.  2/7: Maintaining POS off marinol . Continue encouragement.   16. Stool regimen: colace 200mg  daily  -1-17: DC liquid Colace, having regular bowel movements  -03/31/23 no BM in 3 days, will restart colace at 100mg  daily for now.  -04/01/23  pt states BM yesterday but none documented since 1/15, nurse documentation states LBM 1/16, unclear which is correct; asked nursing to please document if he's had BM today, if none today then would advance bowel regimen --1-20: Increase Colace to 100 mg twice daily, add Senokot S1 tab at night  -1/24 LBM  -04/07/23 no good BM since 1/21, just a smear on 1/24; hard palpable stool in lower abdomen, pt feels it in rectum; will do sorbitol  then mineral oil enema. Hopefully this improves it.  -04/08/23 multiple BMs yesterday with enema/meds, feels better; cont regimen -04/15/23 LBM 2d ago, cont regimen but if no BM by tomorrow, may want to add miralax  or increase senokot-- likely not eating enough to have daily BMs either...  2-3: Refusing stool softeners and Pepcid .  Move Senokot S to as needed 2/5: Last bowel movement recorded 1-31; patient reports bouts of diarrhea with protein shakes.  Will confirm with nursing today.  Given improved p.o. intakes, should start to pick up.  04/21/23 LBM 2 days ago, monitor for now, may need meds readded  17. Thrombocytosis.  resolved  18. 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 Seen on MRI 2/5 for cognitive work up. Likely etiology large vessel dz (L ICA stenosis above + post-op hypotension).  - Neurology consulted, appreciate recs, patient without acute neuro changes this AM.  - Son Geryl called, reviewed results, will d/w patient in AM with him over the phone.  2-6: Discussed findings with patient and his son [name] this a.m.  All questions answered.  Neurology and vascular surgery evaluating, getting echocardiogram and CTA today.  May need CEA more urgently than prior planned 1 month.  Appreciate neurology assistance and recommendations. -Appreciating some slight weakness in right upper extremity and ataxia, which has not been seen on prior exams.  Cognition remains poor, but no other gross deficits. 2/7: Neurology recommending Eliquis   in addition to DAPT d/t EF <30% as above. Dr. Emi planning for L CEA 2/12. Maintain BP and monitor neuro status. - Son Geryl asking again why MRI was not done on admission since brought in with concerns for stroke; explained role of CT in acute settings, and the urgent nature of his cardiovascular intervention. Answered all questions. Geryl notes multiple instances prior to admission where patient acted drunk, ?TIAs.   LOS: 32 days A FACE TO FACE EVALUATION WAS PERFORMED  [address] 04/21/2023, 12:37 PM

## 2023-04-22 MED ORDER — HEPARIN (PORCINE) 25000 UT/250ML-% IV SOLN
900.0000 [IU]/h | INTRAVENOUS | Status: DC
  Administered 2023-04-22 – 2023-04-23 (×2): 600 [IU]/h via INTRAVENOUS
  Administered 2023-04-24: 850 [IU]/h via INTRAVENOUS
  Filled 2023-04-22 (×2): qty 250

## 2023-04-22 NOTE — Progress Notes (Signed)
 PHARMACY - ANTICOAGULATION CONSULT NOTE  Pharmacy Consult for Eliquis  > IV heparin  for procedure 2/12 Indication:  cardiomyopathy with CVA  No Active Allergies  Patient Measurements: Height: 5' 8 (172.7 cm) Weight: 47.8 kg (105 lb 6.1 oz) IBW/kg (Calculated) : 68.4 Heparin  Dosing Weight: 48 kg  Vital Signs: Temp: 97.7 F (36.5 C) (02/09 0440) Temp Source: Oral (02/09 0440) BP: 110/52 (02/09 0440) Pulse Rate: 51 (02/09 0440)  Labs: - Hgb 12.9, platelet count 198 on 04/16/23  Estimated Creatinine Clearance: 47 mL/min (by C-G formula based on SCr of 0.89 mg/dL).   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 Pharmacy consulted to dose Eliquis  on 04/20/23. CBC stable; no bleeding reported.   Eliquis  5 mg BID begun 2/7 pm. For L CEA on 04/25/23 am. Discussed with M. Elna, PA-C with [facility] this afternoon.  Eliquis  discontinued after 2/9 am dose. To begin IV heparin  bridge.   Will use aPTTs for monitoring while heparin  levels are expected to be falsely elevated from recent Eliquis  doses.  Low therapeutic target ranges with CVA.  Heparin  to begin tonight, about 12 hours after last Eliquis  dose (9085).   Goal of Therapy:  Heparin  level 0.3-0.5 units/ml aPTT 66-85 seconds Monitor platelets by anticoagulation protocol: Yes   Plan:  Eliquis  discontinued/ held for procedure 04/25/23. Heparin  drip to begin ~9pm at 600 units/hr. aPTT and heparin  level ~8 hours after drip begins. Daily aPTT and heparin  level until correlating; daily CBC. Will follow up for timing for holding heparin  drip pre-op 04/25/23.  Genaro Zebedee Calin, [place] 04/22/2023,4:24 PM

## 2023-04-22 NOTE — Progress Notes (Signed)
 Physical Therapy Session Note  Patient Details  Name: Walter Vargas MRN: [id number] Date of Birth: 10/22/1945  Today's Date: 04/22/2023 PT Individual Time: 1315-1350 PT Individual Time Calculation (min): 35 min   Short Term Goals: Week 3:  PT Short Term Goal 1 (Week 3): STGs = LTGs  Skilled Therapeutic Interventions/Progress Updates: Pt presented in w/c eating lunch. Pt's tray noted to have barely been touched. Pt requesting increased time to complete meal. PTA returned ~60min later with pt completing meal and agreeable to therapy. Pt agreeable to NuStep and transported to day room for time management as pt continues to perseverate on loss (and finding) of glasses. In Day room pt completed ambulatory transfer to NuStep with CGA. Participated in NuStep L1 x 10 min for general conditioning. Pt then ambulated back to room with RW and CGA. Pt noted to ambulate at varying speeds and required cues to maintain close proximity to RW. In room pt requesting to try bathroom. Completed ambulatory transfer to bathroom and bent down to reach for toilet paper in standing with CGA. Pt then used toilet paper to check for BM (clean). Pt then expressing that at times he feels that he's voided but nothing there. Pt then ambulated back to w/c and left in w/c at end of session with belt alarm on, call bell within reach and needs met.      Therapy Documentation Precautions:  Precautions Precautions: Fall, Sternal, Other (comment) Precaution Booklet Issued: No Precaution Comments: cortrak, sternal precautions Restrictions Weight Bearing Restrictions Per Provider Order: No RUE Weight Bearing Per Provider Order: Weight bearing as tolerated LUE Weight Bearing Per Provider Order: Weight bearing as tolerated General: PT Amount of Missed Time (min): 10 Minutes PT Missed Treatment Reason: Other (Comment) Vital Signs:   Pain:   Mobility:   Locomotion :    Trunk/Postural Assessment :    Balance:   Exercises:    Other Treatments:      Therapy/Group: Individual Therapy  Tacara Hadlock 04/22/2023, 4:19 PM

## 2023-04-22 NOTE — Progress Notes (Signed)
 Physical Therapy Session Note  Patient Details  Name: Walter Vargas MRN: [id number] Date of Birth: 06-Nov-1945  Today's Date: 04/22/2023 PT Individual Time: 1117-1200 PT Individual Time Calculation (min): 43 min   Short Term Goals: Week 4:  PT Short Term Goal 1 (Week 4): STGs = LTGs  Skilled Therapeutic Interventions/Progress Updates: Pt presents sitting in w/c and negotiating w/c in room looking for lost glasses.  Pt attempting to enter BR dragging alarm box on floor.  PT looked throughout room and unable to find glasses.  Calmed pt by stating will request at desk.  Pt requiring increased A for sit to stand transfers 2/2 anxiety and requiring verbal and visual/tactile cues for hand placement.  Pt amb at least 150' to dayroom w/ RW and CGA.  Pt requested looking under pad on w/c, glasses found.  Pt anxiety decreased and improved participation although very verbal relating stories of family to PT.  Pt required return to task.  Pt negotiated through cone obstacle course w/ cues for maintaining position w/in RW.  Pt states need for BR, so amb to room and into BR.  Multiple cues for doffing pants before sitting, but min A for clothing management.  Pt continent of bladder and then min A for clothing management.  Pt amb to w/c w/ RW and CGA although increased cues for safe approach to w/c.  Pt remained sitting w/ chair alarm on and all needs in reach.     Therapy Documentation Precautions:  Precautions Precautions: Fall, Sternal, Other (comment) Precaution Booklet Issued: No Precaution Comments: cortrak, sternal precautions Restrictions Weight Bearing Restrictions Per Provider Order: No RUE Weight Bearing Per Provider Order: Weight bearing as tolerated LUE Weight Bearing Per Provider Order: Weight bearing as tolerated General:   Vital Signs:   Pain:no c/o     Therapy/Group: Individual Therapy  Cabella Kimm P Lashauna Arpin 04/22/2023, 12:25 PM

## 2023-04-22 NOTE — Progress Notes (Signed)
 Occupational Therapy Session Note  Patient Details  Name: Walter Vargas MRN: [id number] Date of Birth: [birth year]  Today's Date: 04/22/2023 OT Individual Time: 0900-1000 OT Individual Time Calculation (min): 60 min    Short Term Goals: Week 1:  OT Short Term Goal 1 (Week 1): Pt will don a shirt with min A OT Short Term Goal 1 - Progress (Week 1): Met OT Short Term Goal 2 (Week 1): Pt will complete LB dressing with min A OT Short Term Goal 2 - Progress (Week 1): Not met OT Short Term Goal 3 (Week 1): Pt will complete toilet transfer with min A OT Short Term Goal 3 - Progress (Week 1): Met OT Short Term Goal 4 (Week 1): Pt will complete ADL routine with no more than 1 rest break to demo increased activity tolerance OT Short Term Goal 4 - Progress (Week 1): Progressing toward goal  Skilled Therapeutic Interventions/Progress Updates:    Patient presenting with slight confession this AM. I introduced myself and explained the nature of the visit.  The pt in agreement with participating in OT services. Patient able to transfer from bed LOF to EOB with CGA.  The pt was able ambulate to the sink area to wash his face and brush his teeth using the RW at [facility]. The pt requires heavy cues secondary to confusion. Nursing came in for morning med during the session.  The pt was able to verbalize his needs cognitively  appropriate.  The pt was able to wash his face and brush his teeth with s/u A.  The pt was able to transfer from a sit to stand with MinA for ambulating to a chair using the RW with CGA.   The pt went on to complete a cognitive task , he was instructed to locate letters that were written on paper and circle them.  The pt completed the task with vc's only.  The pt completed tic tac toe exercise and was able to win 2 games. The pt went on to verbalize the day of the week and he was able to offer a guestimation in relation to the time of day with accuracy The patient presents with slight improvement  from lase week  in relation to cognition for memory and contextual appropriateness of his conversation per MD. At the end of the session, the pt remained at w/c LOF with his chair alarm in place and his bedside table within reach.  All additional needs of the pt were addressed prior to exiting the room.   Therapy Documentation Precautions:  Precautions Precautions: Fall, Sternal, Other (comment) Precaution Booklet Issued: No Precaution Comments: cortrak, sternal precautions Restrictions Weight Bearing Restrictions Per Provider Order: No RUE Weight Bearing Per Provider Order: Weight bearing as tolerated LUE Weight Bearing Per Provider Order: Weight bearing as tolerated Therapy/Group: Individual Therapy  Elvera JONETTA Mace 04/22/2023, 10:28 AM

## 2023-04-22 NOTE — Progress Notes (Signed)
 PROGRESS NOTE   Subjective/Complaints:  Pt doing well again today, working with OT, doing really well. Slept ok, denies pain, LBM 3 days ago, urinating fine. Denies any other complaints or concerns.    ROS: Denies fevers, chills,  abd pain, vomiting, diarrhea, SOB, cough, chest pain, new weakness or paraesthesias.   + confusion, ongoing  + anxiety--ongoing  Objective:   No results found.    No results for input(s): WBC, HGB, HCT, PLT in the last 72 hours.      No results for input(s): NA, K, CL, CO2, GLUCOSE, BUN, CREATININE, CALCIUM  in the last 72 hours.     Intake/Output Summary (Last 24 hours) at 04/22/2023 1140 Last data filed at 04/22/2023 0457 Gross per 24 hour  Intake --  Output 200 ml  Net -200 ml      Pressure Injury 03/15/23 Coccyx Mid;Lower Stage 2 -  Partial thickness loss of dermis presenting as a shallow open injury with a red, pink wound bed without slough. (Active)  03/15/23 0800  Location: Coccyx  Location Orientation: Mid;Lower  Staging: Stage 2 -  Partial thickness loss of dermis presenting as a shallow open injury with a red, pink wound bed without slough.  Wound Description (Comments):   Present on Admission: Yes    Physical Exam: Vital Signs Blood pressure (!) 110/52, pulse (!) 51, temperature 97.7 F (36.5 C), temperature source Oral, resp. rate 17, height 5' 8 (1.727 m), weight 47.8 kg, SpO2 100%.  Constitution: No apparent distress. Cachectic appearance. Sitting in w/c working with OT HENT: MMM.  No deformities. Resp:   clear to auscultation bilaterally.  On room air, no respiratory distress. CTAB Cardio: Bradycardic rate, reg rhythm.   Chronic bilateral lower extremity skin changes. No edema.  Capillary refill mildly delayed in bilateral hands but remains less than 5 seconds.  Bilateral lower extremities brisk.  Peripheral pulses palpable, 2+.  Abdomen:  soft, Nondistended. Nontender,  +BS throughout, normoactive  Psych: Anxious mood. Not perseverating on anything today.  Neuro: a bit less confused than prior, seems cognitively better this weekend. Answered correctly time of day and day of the week.   Skin:   + Right inner thigh/calf with scabbing from vein harvest site - healed  + confluent erythema on extensor surfaces bilateral upper extremities--now extending more proximally to elbows, does appear slightly reduced-- improving  + Bilateral upper extremity bruising, unchanged  PRIOR EXAMS: MSK: Severe lumbar levoscoliosis.  Right shoulder range of motion deficits ~90 degrees abduction.   Neurologic Exam:  Awake, alert, and oriented to self, place; not oriented to time - ongoing +  severe memory and higher cognitive deficits  - ongoing, worsening  No cranial nerve deficits.   Right upper extremity 5 - throughout, left upper extremity 5 out of 5 throughout, bilateral lower extremities 5 out of 5 throughout. Sensation intact.  Slight right upper extremity ataxia on finger-to-nose. +resting tremor BL UE   Assessment/Plan: 1. Functional deficits which require 3+ hours per day of interdisciplinary therapy in a comprehensive inpatient rehab setting. Physiatrist is providing close team supervision and 24 hour management of active medical problems listed below. Physiatrist and rehab team continue to assess  barriers to discharge/monitor patient progress toward functional and medical goals  Care Tool:  Bathing    Body parts bathed by patient: Right arm, Left arm, Chest, Abdomen, Front perineal area, Buttocks, Right upper leg, Left upper leg, Face, Right lower leg, Left lower leg   Body parts bathed by helper: Right lower leg, Left lower leg     Bathing assist Assist Level: Supervision/Verbal cueing     Upper Body Dressing/Undressing Upper body dressing   What is the patient wearing?: Pull over shirt    Upper body assist Assist  Level: Supervision/Verbal cueing    Lower Body Dressing/Undressing Lower body dressing      What is the patient wearing?: Pants, Underwear/pull up     Lower body assist Assist for lower body dressing: Minimal Assistance - Patient > 75%     Toileting Toileting    Toileting assist Assist for toileting: Minimal Assistance - Patient > 75%     Transfers Chair/bed transfer  Transfers assist     Chair/bed transfer assist level: Contact Guard/Touching assist     Locomotion Ambulation   Ambulation assist      Assist level: Contact Guard/Touching assist Assistive device: Walker-rolling Max distance: 150'   Walk 10 feet activity   Assist  Walk 10 feet activity did not occur: Safety/medical concerns  Assist level: Contact Guard/Touching assist Assistive device: Walker-rolling   Walk 50 feet activity   Assist Walk 50 feet with 2 turns activity did not occur: Safety/medical concerns  Assist level: Contact Guard/Touching assist Assistive device: Walker-rolling    Walk 150 feet activity   Assist Walk 150 feet activity did not occur: Safety/medical concerns         Walk 10 feet on uneven surface  activity   Assist Walk 10 feet on uneven surfaces activity did not occur: Safety/medical concerns         Wheelchair     Assist Is the patient using a wheelchair?: Yes Type of Wheelchair: Manual Wheelchair activity did not occur:  (Sternal precautions)  Wheelchair assist level: Dependent - Patient 0% Max wheelchair distance: 150'    Wheelchair 50 feet with 2 turns activity    Assist    Wheelchair 50 feet with 2 turns activity did not occur: Safety/medical concerns       Wheelchair 150 feet activity     Assist  Wheelchair 150 feet activity did not occur: Safety/medical concerns       Blood pressure (!) 110/52, pulse (!) 51, temperature 97.7 F (36.5 C), temperature source Oral, resp. rate 17, height 5' 8 (1.727 m), weight 47.8  kg, SpO2 100%.  Medical Problem List and Plan: 1. 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  Sternal precautions             -patient may shower -ELOS/Goals: 14-18 days supervision to mod I-- Placement/TBD  - 1/23: Per patient/family request, pursuing SNF placement due to inability of caregivers to provide 24/7 support with ongoing min assist ADLs and cognitive deficits requiring assistance   -Stable to continue CIR 1/14: Needs to be independent at discharge per SW; son has moved out of state and can maybe provide 1-2 weeks on discharge. Patient's son's GF has provided some support in the hospital. Carryover very poor; Max-total assist with sternal precautions, Min A if able to push up. Difficulty with LBD and toiletting tasks - will forget what he is doing with toileting tasks. Per PT has been needing  lots of help at home too. Per SW presenting like dementia cog-wise. Doing well with Dys 3. Will need assistance at discharge; SW to discuss with family.  1-16: Discussed patient dispo requirements with son [name], emphasizing need for full supervision at discharge and anticipating will be greater than a 1 to 2 months needed.  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  After discussion with social work, family still unsure who will come in for training, will likely provide supervision for 2 weeks at home prior to deciding whether patient needs more oversight. 1/21: At bedtime incontinence per nursing. Per SW family not agreeing to care plan involving [name]/her mother, which was offered; this is consistent with patient's wishes as well. Unsure of dispo plan at this time as family is not offering alternatives.  1-22: Family discussed appealing discharge with social work due to feeling patient is  not medically optimized.  Per team, primary barriers remain cognitive, not expected to improve further with additional time. 1/28: Per SLP, patient panicked with whole medications even though he complains of crushed; he prefers them to be dust. Geryl is coming next week. Upgraded to regular diet. Ongoing evening behaviors.  2/4: SW has been unable to contact [place] since last week. Awaiting medicaid approval. SW called daughter yesterday, she is skeptical that patient has cognitive deficits limiting independence. Per PT/OT supervision to CGA, with ongoing behavior issues. SLP supervision for swallowing - Mod A for basic tasks with SLP. Will make 15/7 due to limited gains.  2/5: attempted to call Geryl regarding update for MRI findings and psych consult; went to VM, message left that I will call back between 8-9 AM tomorrow 2-6: Discussed with [name], patient findings on MRI consistent with subacute and acute strokes.  Undergoing neurology and vascular workup for possible interventions.  Medical hold on discharge until workup complete. 2/7: Vascular surgery Dr. Magda discussing with family possible inpatient CEA vs. TCAR early next week -- planning for CEA 7/12   2.  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  Venous Doppler studies negative--intermittently refusing daily Lovenox . - ambulation of 450 ft with close SPV yesterday; DC lovenox  2/4  -2-6: Starting DAPT per neurology/vascular due to stroke.  Pending echocardiogram, may need DOAC. -2-7: EF <30 %, will need AC per neurology, plan for Eliquis  starting tonight once confirmed by Neuro and will place hold day for pre-op per Vascular.    3. Pain Management: Oxycodone  as needed--no complaints  4. Mood/Behavior/Sleep: Provide emotional support             - antipsychotic agents: Seroquel  25 mg daily at 1000 and 50 mg nightly   - Continue current regimen, can add melatonin 5 mg as  needed  -1-13: Will start to wean off antipsychotics; DC AM Seroquel  for tomorrow 1-15: Patient tolerated well, change nightly Seroquel  to as needed--has resulted in somewhat interrupted sleep but so far has been tolerable.  Alyse Domino states he gets anxious at night sometimes regarding dispo issues. 1/17: No PRNs used.  Continue melatonin, will add nightly as needed trazodone  50 mg retime at bedtime medications to 8 PM given this is his normal bedtime--improved 1-20: Family reporting compulsive calls, anxieties in the early evenings between therapies and sleep.  Patient is sleeping well, no hallucinations or signs of delirium at this time.  Likely early sundowning, adding Atarax   for anxiety and informed nursing to be aware of these behaviors. 04-03-21: No reports by nursing overnight, no use of as needed Atarax  1-24: Increasing anxiety and confused behavior since family elected going to SNF; encouraged use of PRN Atarax , start BuSpar  5 mg twice daily 1-28: Adding mirtazapine  7.5 mg nightly for sleep, appetite stimulation, and ongoing anxiety reported by staff. 1/29: DC buspar  due to no effect, addition of mirtazepine.  1/30: DC mirtazepine due to persistent orthostasis  1/31: Endorses he slept well overnight, continue current management -04/14/23 endorses poor sleep but isn't really sure; monitor for now 2-3: Refused melatonin overnight.  Moved to as needed 2/7: Remains very anxious--with improvements in BP could start adding back standing medications, will do PRN atarax  for this weekend  5. Neuropsych/cognition: This patient is not capable of making decisions on his own behalf.  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  Weaning sedating medications as above.  Will start Aricept  5 mg nightly-theoretical risk of arrhythmia, however on literature review cardiac events matched placebo on  meta-analysis. 1-15: Tolerating Aricept  well--and noticing some mild cognitive improvements 2-3: Psychiatry consulted for capacity evaluation, determined patient does not currently have capacity and cannot sign himself out AMA. Appreciate their insiight and assitance.  2-4: Ordering MRI brain, labs including TSH, folate, B12, thiamine  for neurologic contributors to cognitive deficits.  Low suspicion of stroke given no gross motor or sensory deficits.  Patient has as needed Xanax  if needed for MRI. 2-5: TSH elevated, other labs stable.  Parameters showing subclinical hypothyroidism, will defer treatment for now.  MRI showing left corona radiata acute and subacute strokes.  See below.  6. Skin/Wound Care: Routine skin checks. -His right lower extremity harvest site completing course of Ancef  for cellulitis --1/8: transitioned to Augmentin  1-8 to complete 10-day course--appearance slightly more edematous/erythematous 1-14, will get WBC in a.m. and monitor closely, still staying within margins of prior marking so may just be taking a long time to resolve 1-13: DC PICC line--site looks clean. -1-17: Right clavicle staples removed.  Remove abdominal sutures today. - 1/21: RLE swelling/infection resolved 1-22: Dry skin overlying prior infection site; add Eucerin daily--appearance improving 2/3: DC PIV   - 2/5: Confluent rash on BL UE this AM; add PRN benadryll - 2-6: Ongoing bilateral upper extremity erythema, nonpainful, does not look like a rash; mildly delayed capillary refill with good peripheral pulses, ?raynaud's per patient self endorsed history of similar events popping up and going within a few days. Continue to monitor. --seems improving 2/9   7. Fluids/Electrolytes/Nutrition/hyponatremia: Routine in and outs with follow-up chemistries, continue supplements.  -1-8: Low albumin , protein; continue current supplemental tube feeds as below.  Encourage protein and p.o. intakes.  Very mild  hyponatremia, will recheck on Friday. 1-10: Hyponatremia 129, slight downtrend from 130.  Repeat BMP, serum osmol, urine studies in a.m. to confirm etiology--will be somewhat complicated by use of torsemide .  Placed on 1200 cc fluid restriction; patient is remaining well below this. -03/24/23 Na up to 132, SOsm WNL, urine studies not done yet -03/25/23 Urine Osm still not done... scheduled weekly labs starting Monday - 1/13: Na improved/stable - repeat Wednesday--add magnesium  to today's labs--2.1, stable 1-15: NA stable 131; add calcium  supplementation, PRN tums, and increase protonic to 40 mg BID for at bedtime nausea 1-17: Added as needed Zofran  4 mg every 8 hours, and miscellaneous nursing order to try and divide out medications and not give  all at once crushed in applesauce due to intolerance of foul taste 1-20: Only 1 use of Zofran  over the weekend, no recorded emesis --still having nausea around crushed pills only -04/08/23 BMP done yesterday afternoon, Na 132 stable, BUN 47 and Cr 1.44 which is up-- encourage PO fluids today, recheck tomorrow; will hold off on starting fluids just yet.  1-27: Sodium remains downtrending 129, BUN and creatinine slightly improved.  P.o. intakes appear low Is.  Start 500 cc IV fluids today, repeat in AM. 1-28: Sodium improved to 131 with IV fluids and DC torsemide .  Continue regular monitoring of sodium 1-30: Na back down to 128; DC BP medications, DC mirtazepine, start IVF NS 50 cc/hr for 1 day. CMP in AM. Now off confounding meds will plan for urine studies in AM to eval for SIADH 1-31: NA back to 131.  Unfortunately, still orthostatic, continue IV fluids.  Serum osmolality within normal limits, awaiting urine studies for full picture.-- UOsm 666 WNL, UNa 77--concentrating appropriately 2-3: Sodium improved 133 off of IV fluids.--Remaining stable 2-4  8.  Acute blood loss anemia.  Follow-up CBC -stable   - HgB improving/stable  9.  COVID-positive.  Completed  Paxlovid   -1-17: DC schedule guaifenesin .  10.  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  Seen by vascular surgery -- see #18  11.  History of VT peri-CABG.  Amiodarone  200 mg BID>>daily, Lanoxin  0.125 mg daily  -Remains with low/stable heart rate -1-14: Patient reporting irregular palpitations during therapies.  None documented, made PT aware so they can document any events during their therapies today.--did not occur per therapies 1/14 1-29: Adjusting blood pressure medications as below; may need to adjust amiodarone  to 100 mg daily if no improvement with adjustment of diuretics--ongoing 1-31: Reached out to cardiology regarding ongoing orthostasis with multiple recent medication adjustments.  They have recommended discontinuation of Farxiga , as this can cause orthostasis, also possible contributed to hyperkalemia.  Will do this today, continue other medications at current dose 2-3: Vitals appear improved.  Heart rate staying stable.   12.  Hyperlipidemia.  Lipitor  80mg  daily 13.  Diastolic congestive heart failure.  Torsemide  20 mg daily, Cozaar  25 mg daily, Aldactone  25 mg daily. Dapagliflozin  10mg  daily. Monitor for any signs of fluid overload.  Daily weights.  1-24: Weights with ongoing downtrend, per record patient has adequate p.o. intakes and compliant with nutrition shakes between meals, now with reports of orthostasis and blood pressure 80 over 50s this afternoon.  Reduce torsemide  to 10 mg daily, and encourage p.o. fluids. - Orthostatic vitals in AM ordered -1/25-26/25 wt fairly stable; monitor 1/27: Increasingly  low blood pressure, will DC torsemide  today and encourage p.o. fluids, initiate 500 cc IV fluids at 50 cc/h today.  BMP in AM. 1-28: Weights have stabilized today.  Continue monitoring for 1 to 2 days, then consider resumption of torsemide  at lower dose. 1/29: Worsening orthostasis despite holding diuretic torsemide , no peripheral signs  of fluid overload, although weights have normalized at this point.  DC spironolactone  25 mg daily and Cozaar  25 mg daily, monitor closely.  Encourage p.o. fluids. 1/30: Continued orthostasis, weights stable and euvolemic; add IVF as above 1-31: Blood pressure mildly improved with IV fluid, but continues to be orthostatic.  Continue for today, continue to emphasize to patient encourage of p.o. fluid intake as well.  DC Farxiga  per cardiology as above. 2-3: Blood pressures improved since DC Farxiga .  Continue current management.--Continues losing weight  daily 2-5: Weight stable/up 2-6: Weight stabilizing, blood pressure improving.  Due to recent strokes, will allow some higher blood pressures to maintain perfusion.  Still remains normovolemic.  Getting echocardiogram today. 2/7: Staying stable, no s/s volume overload, Echo today with EF 20-25% - improved from prior <20%. Would not overtreat HTN this weekend given significant challenges with maintaining BP and L ICA stenosis/stroke.   -04/22/23 BP fine, wt variable but looks good overall, monitor.  Filed Weights   04/18/23 0600 04/21/23 0500 04/22/23 0456  Weight: 49.2 kg 50.5 kg 47.8 kg   Vitals:   04/18/23 1927 04/19/23 0501 04/19/23 0942 04/19/23 1422  BP: 126/61 (!) 112/50 (!) 108/58 107/86   04/19/23 1948 04/20/23 0523 04/20/23 1424 04/20/23 2048  BP: (!) 110/58 120/62 105/67 (!) 113/56   04/21/23 0519 04/21/23 1716 04/21/23 1956 04/22/23 0440  BP: (!) 112/53 (!) 118/54 118/63 (!) 110/52    14.  Sacral wound.  Wound care nurse follow-up.  Covered with foam dressing -1-16: Caught himself on the back during a toileting transfer today per nursing, resulting in small wound, covered in barrier dressing.  15.  Dysphagia/severe protein calorie malnutrition.  Dysphagia #2 thin liquids.   -Cortrak on intake; Continue supplemental tube feeds as above, encourage p.o. intakes, protein. -1-10: Eating 60 to 90% of meals per documentation.  Discontinue  core track, tube feeds.--Continuing to eat well 1/28: P.o. intakes adequate per record, but cachectic appearance and continuing to lose weight.  Somewhat improved with adjustment of diuretics as above.  Will start mirtazapine  for appetite stimulation. 1-29: Dietary evaluated, continue current management with BID ensure shakes 1/30: Continues poor PO intakes despite considerable encouragement; unfortunately not tolerating mirtazepine 1-31: Family bringing in food that he likes today.  Encouraged consumption of protein shakes, Magic cups between meals.   Has lost ~20 lbs since admission 1/8; some due to diuresis, eating 25-80% meals (avg 60%), continues to lose weight -  Nutrition onboard and assisting.    2-3: Weights continue to downtrend.  Increasing Ensure to 3 times daily between meals. DC Pepcid  due to patient refusal. Adding appetite stimulant marinol  2.5 mg BID today.   2-4: P.o. intakes appear improved since starting Marinol .  Did change Ensure to boost breeze per patient preference. 2-5: Weight stable/up for the first time today.  Ate 100% of breakfast and 80% of dinner yesterday.  Albumin  is uptrending per CMP.  Hopeful to start making gains in this area  2-6: Weights uptrending, eating better.  DC Marinol  due to slightly worsening confusion of the last few days.  2/7: Maintaining POS off marinol . Continue encouragement.   16. Stool regimen: colace 200mg  daily  -1-17: DC liquid Colace, having regular bowel movements  -03/31/23 no BM in 3 days, will restart colace at 100mg  daily for now.  -04/01/23 pt states BM yesterday but none documented since 1/15, nurse documentation states LBM 1/16, unclear which is correct; asked nursing to please document if he's had BM today, if none today then would advance bowel regimen --1-20: Increase Colace to 100 mg twice daily, add Senokot S1 tab at night  -1/24 LBM  -04/07/23 no good BM since 1/21, just a smear on 1/24; hard palpable stool in lower  abdomen, pt feels it in rectum; will do sorbitol  then mineral oil enema. Hopefully this improves it.  -04/08/23 multiple BMs yesterday with enema/meds, feels better; cont regimen -04/15/23 LBM 2d ago, cont regimen but if no BM by tomorrow, may want to add miralax  or increase  senokot-- likely not eating enough to have daily BMs either...  2-3: Refusing stool softeners and Pepcid .  Move Senokot S to as needed 2/5: Last bowel movement recorded 1-31; patient reports bouts of diarrhea with protein shakes.  Will confirm with nursing today.  Given improved p.o. intakes, should start to pick up.  04/21/23 LBM 2 days ago, monitor for now, may need meds readded -04/22/23 LBM 3 days ago, if no BM by tomorrow may want to adjust bowel meds again  17. Thrombocytosis.  resolved  18. 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 Seen on MRI 2/5 for cognitive work up. Likely etiology large vessel dz (L ICA stenosis above + post-op hypotension).  - Neurology consulted, appreciate recs, patient without acute neuro changes this AM.  - Son Geryl called, reviewed results, will d/w patient in AM with him over the phone.  2-6: Discussed findings with patient and his son [name] this a.m.  All questions answered.  Neurology and vascular surgery evaluating, getting echocardiogram and CTA today.  May need CEA more urgently than prior planned 1 month.  Appreciate neurology assistance and recommendations. -Appreciating some slight weakness in right upper extremity and ataxia, which has not been seen on prior exams.  Cognition remains poor, but no other gross deficits. 2/7: Neurology recommending Eliquis  in addition to DAPT d/t EF <30% as above. Dr. Emi planning for L CEA 2/12. Maintain BP and monitor neuro status. - Son Geryl asking again why MRI was not done on admission since brought in with concerns for stroke; explained role of CT in acute settings, and the urgent nature of his cardiovascular  intervention. Answered all questions. Geryl notes multiple instances prior to admission where patient acted drunk, ?TIAs.  -04/22/23 much better cognitively this weekend than prior weekends.  LOS: 33 days A FACE TO FACE EVALUATION WAS PERFORMED  [address] 04/22/2023, 11:40 AM

## 2023-04-22 NOTE — Plan of Care (Signed)
  Problem: RH BOWEL ELIMINATION Goal: RH STG MANAGE BOWEL WITH ASSISTANCE Description: STG Manage Bowel with toileting Assistance. Outcome: Progressing   Problem: RH BLADDER ELIMINATION Goal: RH STG MANAGE BLADDER WITH ASSISTANCE Description: STG Manage Bladder With toileting Assistance Outcome: Progressing   Problem: RH SKIN INTEGRITY Goal: RH STG SKIN FREE OF INFECTION/BREAKDOWN Description: Manage skin with min assist Outcome: Progressing

## 2023-04-23 LAB — CBC
HCT: 34.8 % — ABNORMAL LOW (ref 39.0–52.0)
Hemoglobin: 11.2 g/dL — ABNORMAL LOW (ref 13.0–17.0)
MCH: 28.5 pg (ref 26.0–34.0)
MCHC: 32.2 g/dL (ref 30.0–36.0)
MCV: 88.5 fL (ref 80.0–100.0)
Platelets: 218 10*3/uL (ref 150–400)
RBC: 3.93 MIL/uL — ABNORMAL LOW (ref 4.22–5.81)
RDW: 17.8 % — ABNORMAL HIGH (ref 11.5–15.5)
WBC: 5.3 10*3/uL (ref 4.0–10.5)
nRBC: 0 % (ref 0.0–0.2)

## 2023-04-23 LAB — BASIC METABOLIC PANEL
Anion gap: 15 (ref 5–15)
BUN: 19 mg/dL (ref 8–23)
CO2: 25 mmol/L (ref 22–32)
Calcium: 9.4 mg/dL (ref 8.9–10.3)
Chloride: 98 mmol/L (ref 98–111)
Creatinine, Ser: 0.68 mg/dL (ref 0.61–1.24)
GFR, Estimated: >60 mL/min (ref >60)
Glucose, Bld: 88 mg/dL (ref 70–99)
Potassium: 4.2 mmol/L (ref 3.5–5.1)
Sodium: 138 mmol/L (ref 135–145)

## 2023-04-23 LAB — APTT
aPTT: 38 s — ABNORMAL HIGH (ref 24–36)
aPTT: 59 s — ABNORMAL HIGH (ref 24–36)

## 2023-04-23 LAB — HEPARIN LEVEL (UNFRACTIONATED): Heparin Unfractionated: >1.1 [IU]/mL — ABNORMAL HIGH (ref 0.30–0.70)

## 2023-04-23 MED ORDER — PANTOPRAZOLE SODIUM 40 MG PO TBEC
40.0000 mg | DELAYED_RELEASE_TABLET | Freq: Two times a day (BID) | ORAL | Status: DC
Start: 2023-04-23 — End: 2023-04-25
  Administered 2023-04-24 (×2): 40 mg via ORAL
  Filled 2023-04-23 (×3): qty 1

## 2023-04-23 NOTE — Progress Notes (Signed)
 [facility] [place] PROGRESS NOTE  ASSESSMENT / PLAN: Walter Vargas is a 78 y.o. male with symptomatic left carotid artery stenosis. Plan left carotid endarterectomy Wednesday 04/25/23. Continue anticoagulation with heparin .   SUBJECTIVE: No complaints. Working with OT.  OBJECTIVE: BP (!) 117/57   Pulse 75   Temp 97.8 F (36.6 C) (Oral)   Resp 18   Ht 5\' 8"  (1.727 m)   Wt 49.4 kg   SpO2 100%   BMI 16.56 kg/m   Intake/Output Summary (Last 24 hours) at 04/23/2023 0921 Last data filed at 04/22/2023 1900 Gross per 24 hour  Intake 237 ml  Output --  Net 237 ml    Sitting up in bed.  Regular rate and rhythm Unlabored breathing.     Latest Ref Rng & Units 04/23/2023    6:01 AM 04/16/2023    5:36 AM 04/09/2023    6:00 AM  CBC  WBC 4.0 - 10.5 K/uL 5.3  4.4  7.1   Hemoglobin 13.0 - 17.0 g/dL 16.1  09.6  04.5   Hematocrit 39.0 - 52.0 % 34.8  39.8  42.1   Platelets 150 - 400 K/uL 218  198  216         Latest Ref Rng & Units 04/23/2023    6:01 AM 04/17/2023   12:57 PM 04/16/2023    5:36 AM  CMP  Glucose 70 - 99 mg/dL 88  94  76   BUN 8 - 23 mg/dL 19  22  12    Creatinine 0.61 - 1.24 mg/dL 4.09  8.11  9.14   Sodium 135 - 145 mmol/L 138  133  133   Potassium 3.5 - 5.1 mmol/L 4.2  4.7  4.2   Chloride 98 - 111 mmol/L 98  98  103   CO2 22 - 32 mmol/L 25  25  23    Calcium  8.9 - 10.3 mg/dL 9.4  9.1  8.7   Total Protein 6.5 - 8.1 g/dL  6.9    Total Bilirubin 0.0 - 1.2 mg/dL  0.8    Alkaline Phos 38 - 126 U/L  104    AST 15 - 41 U/L  34    ALT 0 - 44 U/L  38      Estimated Creatinine Clearance: 54 mL/min (by C-G formula based on SCr of 0.68 mg/dL).  Heber Little. Edgardo Goodwill, MD [facility] Phone Number: [phone] 04/23/2023 9:21 AM

## 2023-04-23 NOTE — Progress Notes (Signed)
 Physical Therapy Session Note  Patient Details  Name: Walter Vargas MRN: [id number] Date of Birth: April 29, 1945  Today's Date: 04/23/2023 PT Individual Time: 0804-0900 PT Individual Time Calculation (min): 56 min   Short Term Goals: Week 4:  PT Short Term Goal 1 (Week 4): STGs = LTGs  Skilled Therapeutic Interventions/Progress Updates:     Pt received supine in bed and agrees to therapy. Pt is eager to get moving and get out of bed. No complaint of pain. Supine to sit with cues for positioning at EOB. Pt requires increased time to don shoes at EOB. Pt stands with cues for hand placement, then ambulates x150' to gym with RW and cues to increase proximity to RW for safety, as well as increasing upright posture to improve balance. Following rest break, pt ambulates additional x150' with same assistance and cues. From mat table, pt performs repeated reps of sit to stand with arms across chest. Performed for strengthening of lower extremities, as well as for transfer and balance training. Pt requires minA to stand without use of arms, with PT facilitating anterior weight shift, with cues for body mechanics and sequencing. Pt completes 3x7 with extended seated rest breaks. Pt ambulates back to room with RW. Left seated with alarm intact and all needs within reach.    Therapy Documentation Precautions:  Precautions Precautions: Fall, Sternal, Other (comment) Precaution Booklet Issued: No Precaution Comments: cortrak, sternal precautions Restrictions Weight Bearing Restrictions Per Provider Order: No RUE Weight Bearing Per Provider Order: Weight bearing as tolerated LUE Weight Bearing Per Provider Order: Weight bearing as tolerated    Therapy/Group: Individual Therapy  Neva Barban, PT, DPT 04/23/2023, 3:50 PM

## 2023-04-23 NOTE — Progress Notes (Signed)
PROGRESS NOTE   Subjective/Complaints:  On heparin bridge, HgB down slightly 13->12->11.2 this AM.  Patient denies any grossly bloody bowel movements or apparent bleeding. Other labs and vitals stable Feeling good about procedure on Wednesday.  No ongoing concerns.  ROS: Denies fevers, chills,  abd pain, vomiting, diarrhea, SOB, cough, chest pain, new weakness or paraesthesias.   + confusion, ongoing  + anxiety--ongoing  Objective:   No results found.    Recent Labs    04/23/23 0601  WBC 5.3  HGB 11.2*  HCT 34.8*  PLT 218        Recent Labs    04/23/23 0601  NA 138  K 4.2  CL 98  CO2 25  GLUCOSE 88  BUN 19  CREATININE 0.68  CALCIUM 9.4       Intake/Output Summary (Last 24 hours) at 04/23/2023 8657 Last data filed at 04/22/2023 1900 Gross per 24 hour  Intake 237 ml  Output --  Net 237 ml      Pressure Injury 03/15/23 Coccyx Mid;Lower Stage 2 -  Partial thickness loss of dermis presenting as a shallow open injury with a red, pink wound bed without slough. (Active)  03/15/23 0800  Location: Coccyx  Location Orientation: Mid;Lower  Staging: Stage 2 -  Partial thickness loss of dermis presenting as a shallow open injury with a red, pink wound bed without slough.  Wound Description (Comments):   Present on Admission: Yes    Physical Exam: Vital Signs Blood pressure (!) 117/57, pulse 75, temperature 97.8 F (36.6 C), temperature source Oral, resp. rate 18, height 5\' 8"  (1.727 m), weight 49.4 kg, SpO2 100%.  Constitution: No apparent distress. Cachectic appearance.  Sitting up in gym.  HENT: MMM.  No deformities. Resp:   clear to auscultation bilaterally.  On room air, no respiratory distress. CTAB Cardio: Bradycardic rate, reg rhythm.   Chronic bilateral upper and lower extremity skin changes.  Capillary refill brisk in bilateral upper extremities; palpable peripheral pulses. Abdomen: soft,  Nondistended. Nontender,  +BS throughout, normoactive  Psych: Anxious mood. Not perseverating on anything today.  Skin: + Right inner thigh/calf with scabbing from vein harvest site - healed  + confluent erythema on extensor surfaces bilateral upper extremities--now extending more proximally to elbows--also appreciated in feet 2-10  + Bilateral upper extremity bruising, unchanged  MSK: Severe lumbar levoscoliosis.  Right shoulder range of motion deficits ~90 degrees abduction.   Neurologic Exam:  Awake, alert, and oriented to self, place; not oriented to time - ongoing +  severe memory and higher cognitive deficits  -seems stable.  No cranial nerve deficits.   Right upper extremity 5 - throughout, left upper extremity 5 out of 5 throughout, bilateral lower extremities 5 out of 5 throughout. Sensation intact.  Slight right upper extremity ataxia on finger-to-nose. +resting tremor BL UE Minimal changes 2-10  Assessment/Plan: 1. Functional deficits which require 3+ hours per day of interdisciplinary therapy in a comprehensive inpatient rehab setting. Physiatrist is providing close team supervision and 24 hour management of active medical problems listed below. Physiatrist and rehab team continue to assess barriers to discharge/monitor patient progress toward functional and medical goals  Care Tool:  Bathing    Body parts bathed by patient: Right arm, Left arm, Chest, Abdomen, Front perineal area, Buttocks, Right upper leg, Left upper leg, Face, Right lower leg, Left lower leg   Body parts bathed by helper: Right lower leg, Left lower leg     Bathing assist Assist Level: Supervision/Verbal cueing     Upper Body Dressing/Undressing Upper body dressing   What is the patient wearing?: Pull over shirt    Upper body assist Assist Level: Supervision/Verbal cueing    Lower Body Dressing/Undressing Lower body dressing      What is the patient wearing?: Pants, Underwear/pull up      Lower body assist Assist for lower body dressing: Minimal Assistance - Patient > 75%     Toileting Toileting    Toileting assist Assist for toileting: Minimal Assistance - Patient > 75%     Transfers Chair/bed transfer  Transfers assist     Chair/bed transfer assist level: Contact Guard/Touching assist     Locomotion Ambulation   Ambulation assist      Assist level: Contact Guard/Touching assist Assistive device: Walker-rolling Max distance: 150+   Walk 10 feet activity   Assist  Walk 10 feet activity did not occur: Safety/medical concerns  Assist level: Contact Guard/Touching assist Assistive device: Walker-rolling   Walk 50 feet activity   Assist Walk 50 feet with 2 turns activity did not occur: Safety/medical concerns  Assist level: Contact Guard/Touching assist Assistive device: Walker-rolling    Walk 150 feet activity   Assist Walk 150 feet activity did not occur: Safety/medical concerns  Assist level: Contact Guard/Touching assist Assistive device: Walker-rolling    Walk 10 feet on uneven surface  activity   Assist Walk 10 feet on uneven surfaces activity did not occur: Safety/medical concerns         Wheelchair     Assist Is the patient using a wheelchair?: Yes Type of Wheelchair: Manual Wheelchair activity did not occur:  (Sternal precautions)  Wheelchair assist level: Dependent - Patient 0% Max wheelchair distance: 150'    Wheelchair 50 feet with 2 turns activity    Assist    Wheelchair 50 feet with 2 turns activity did not occur: Safety/medical concerns       Wheelchair 150 feet activity     Assist  Wheelchair 150 feet activity did not occur: Safety/medical concerns       Blood pressure (!) 117/57, pulse 75, temperature 97.8 F (36.6 C), temperature source Oral, resp. rate 18, height 5\' 8"  (1.727 m), weight 49.4 kg, SpO2 100%.  Medical Problem List and Plan: 1. 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  Sternal precautions             -patient may shower -ELOS/Goals: 14-18 days supervision to mod I-- Placement/TBD  - 1/23: Per patient/family request, pursuing SNF placement due to inability of caregivers to provide 24/7 support with ongoing min assist ADLs and cognitive deficits requiring assistance   -Stable to continue CIR 1/14: Needs to be independent at discharge per SW; son has moved out of state and can maybe provide 1-2 weeks on discharge. Patient's son's GF has provided some support in the hospital. Carryover very poor; Max-total assist with sternal precautions, Min A if able to push up. Difficulty with LBD and toiletting tasks - will forget what he is doing with toileting tasks. Per PT has been needing lots of help at home too. Per SW presenting like dementia  cog-wise. Doing well with Dys 3. Will need assistance at discharge; SW to discuss with family.  1-16: Discussed patient dispo requirements with son [name], emphasizing need for full supervision at discharge and anticipating will be greater than a 1 to 2 months needed.  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  After discussion with social work, family still unsure who will come in for training, will likely provide supervision for 2 weeks at home prior to deciding whether patient needs more oversight. 1/21: At bedtime incontinence per nursing. Per SW family not agreeing to care plan involving [name]/her mother, which was offered; this is consistent with patient's wishes as well. Unsure of dispo plan at this time as family is not offering alternatives.  1-22: Family discussed appealing discharge with social work due to feeling patient is not medically optimized.  Per team, primary barriers remain cognitive, not expected  to improve further with additional time. 1/28: Per SLP, patient panicked with whole medications even though he complains of crushed; he prefers them to be "dust". Markham Jordan is coming next week. Upgraded to regular diet. Ongoing evening behaviors.  2/4: SW has been unable to contact [facility] since last week. Awaiting medicaid approval. SW called daughter yesterday, she is skeptical that patient has cognitive deficits limiting independence. Per PT/OT supervision to CGA, with ongoing behavior issues. SLP supervision for swallowing - Mod A for basic tasks with SLP. Will make 15/7 due to limited gains.  2/5: attempted to call Markham Jordan regarding update for MRI findings and psych consult; went to VM, message left that I will call back between 8-9 AM tomorrow 2-6: Discussed with [name], patient findings on MRI consistent with subacute and acute strokes.  Undergoing neurology and vascular workup for possible interventions.  Medical hold on discharge until workup complete. 2/7: Vascular surgery Dr. Lenell Antu discussing with family possible inpatient CEA vs. TCAR early next week -- planning for CEA 7/12   2.  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  Venous Doppler studies negative--intermittently refusing daily Lovenox. - ambulation of 450 ft with close SPV yesterday; DC lovenox 2/4  -2-6: Starting DAPT per neurology/vascular due to stroke.  Pending echocardiogram, may need DOAC. -2-7: EF <30 %, will need AC per neurology, plan for Eliquis starting tonight once confirmed by Neuro and will place hold day for pre-op per Vascular.   - 2/10: Heparin bridge per pharmacy   3. Pain Management: Oxycodone as needed--no complaints  4. Mood/Behavior/Sleep: Provide emotional support             - antipsychotic agents: Seroquel 25 mg daily at 1000 and 50 mg nightly   - Continue current regimen, can add melatonin 5 mg as needed  -1-13: Will start to wean off  antipsychotics; DC AM Seroquel for tomorrow 1-15: Patient tolerated well, change nightly Seroquel to as needed--has resulted in somewhat interrupted sleep but so far has been tolerable.  Noel Christmas states he gets anxious at night sometimes regarding dispo issues. 1/17: No PRNs used.  Continue melatonin, will add nightly as needed trazodone 50 mg retime at bedtime medications to 8 PM given this is his normal bedtime--improved 1-20: Family reporting compulsive calls, anxieties in the early evenings between therapies and sleep.  Patient is sleeping well, no hallucinations or signs of delirium at this time.  Likely early sundowning, adding Atarax for anxiety and informed  nursing to be aware of these behaviors. 04-03-21: No reports by nursing overnight, no use of as needed Atarax 1-24: Increasing anxiety and confused behavior since family elected going to SNF; encouraged use of PRN Atarax, start BuSpar 5 mg twice daily 1-28: Adding mirtazapine 7.5 mg nightly for sleep, appetite stimulation, and ongoing anxiety reported by staff. 1/29: DC buspar due to no effect, addition of mirtazepine.  1/30: DC mirtazepine due to persistent orthostasis  1/31: Endorses he slept well overnight, continue current management -04/14/23 endorses poor sleep but isn't really sure; monitor for now 2-3: Refused melatonin overnight.  Moved to as needed 2/7: Remains very anxious--with improvements in BP could start adding back standing medications, will do PRN atarax for this weekend  5. Neuropsych/cognition: This patient is not capable of making decisions on his own behalf.  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  Weaning sedating medications as above.  Will start Aricept 5 mg nightly-theoretical risk of arrhythmia, however on literature review cardiac events matched placebo on meta-analysis. 1-15: Tolerating Aricept well--and  noticing some mild cognitive improvements 2-3: Psychiatry consulted for capacity evaluation, determined patient does not currently have capacity and cannot sign himself out AMA. Appreciate their insiight and assitance.  2-4: Ordering MRI brain, labs including TSH, folate, B12, thiamine for neurologic contributors to cognitive deficits.  Low suspicion of stroke given no gross motor or sensory deficits.  Patient has as needed Xanax if needed for MRI. 2-5: TSH elevated, other labs stable.  Parameters showing subclinical hypothyroidism, will defer treatment for now.  MRI showing left corona radiata acute and subacute strokes.  See below.  6. Skin/Wound Care: Routine skin checks. -His right lower extremity harvest site completing course of Ancef for cellulitis --1/8: transitioned to Augmentin 1-8 to complete 10-day course--appearance slightly more edematous/erythematous 1-14, will get WBC in a.m. and monitor closely, still staying within margins of prior marking so may just be taking a long time to resolve 1-13: DC PICC line--site looks clean. -1-17: Right clavicle staples removed.  Remove abdominal sutures today. - 1/21: RLE swelling/infection resolved 1-22: Dry skin overlying prior infection site; add Eucerin daily--appearance improving 2/3: DC PIV   - 2/5: Confluent rash on BL UE this AM; add PRN benadryll - 2-6: Ongoing bilateral upper extremity erythema, nonpainful, does not look like a rash; mildly delayed capillary refill with good peripheral pulses, ?raynaud's per patient self endorsed history of similar events popping up and going within a few days. Continue to monitor. --seems improving 2/9--also present in feet 2-10   7. Fluids/Electrolytes/Nutrition/hyponatremia: Routine in and outs with follow-up chemistries, continue supplements.  -1-8: Low albumin, protein; continue current supplemental tube feeds as below.  Encourage protein and p.o. intakes.  Very mild hyponatremia, will recheck on  Friday. 1-10: Hyponatremia 129, slight downtrend from 130.  Repeat BMP, serum osmol, urine studies in a.m. to confirm etiology--will be somewhat complicated by use of torsemide.  Placed on 1200 cc fluid restriction; patient is remaining well below this. -03/24/23 Na up to 132, SOsm WNL, urine studies not done yet -03/25/23 Urine Osm still not done... scheduled weekly labs starting Monday - 1/13: Na improved/stable - repeat Wednesday--add magnesium to today's labs--2.1, stable 1-15: NA stable 131; add calcium supplementation, PRN tums, and increase protonic to 40 mg BID for at bedtime nausea 1-17: Added as needed Zofran 4 mg every 8 hours, and miscellaneous nursing order to try and divide out medications and not give  all at once crushed in applesauce due to intolerance of foul taste 1-20: Only 1 use of Zofran over the weekend, no recorded emesis --still having nausea around crushed pills only -04/08/23 BMP done yesterday afternoon, Na 132 stable, BUN 47 and Cr 1.44 which is up-- encourage PO fluids today, recheck tomorrow; will hold off on starting fluids just yet.  1-27: Sodium remains downtrending 129, BUN and creatinine slightly improved.  P.o. intakes appear low Is.  Start 500 cc IV fluids today, repeat in AM. 1-28: Sodium improved to 131 with IV fluids and DC torsemide.  Continue regular monitoring of sodium 1-30: Na back down to 128; DC BP medications, DC mirtazepine, start IVF NS 50 cc/hr for 1 day. CMP in AM. Now off confounding meds will plan for urine studies in AM to eval for SIADH 1-31: NA back to 131.  Unfortunately, still orthostatic, continue IV fluids.  Serum osmolality within normal limits, awaiting urine studies for full picture.-- UOsm 666 WNL, UNa 77--concentrating appropriately 2-3: Sodium improved 133 off of IV fluids.--Remaining stable 2-4  8.  Acute blood loss anemia.  Follow-up CBC -stable   - HgB improving/stable  9.  COVID-positive.  Completed Paxlovid  -1-17: DC schedule  guaifenesin.  10.  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  Seen by vascular surgery -- see #18  11.  History of VT peri-CABG.  Amiodarone 200 mg BID>>daily, Lanoxin 0.125 mg daily  -Remains with low/stable heart rate -1-14: Patient reporting irregular palpitations during therapies.  None documented, made PT aware so they can document any events during their therapies today.--did not occur per therapies 1/14 1-29: Adjusting blood pressure medications as below; may need to adjust amiodarone to 100 mg daily if no improvement with adjustment of diuretics--ongoing 1-31: Reached out to cardiology regarding ongoing orthostasis with multiple recent medication adjustments.  They have recommended discontinuation of Farxiga, as this can cause orthostasis, also possible contributed to hyperkalemia.  Will do this today, continue other medications at current dose 2-3: Vitals appear improved.  Heart rate staying stable.   12.  Hyperlipidemia.  Lipitor 80mg  daily 13.  Diastolic congestive heart failure.  Torsemide 20 mg daily, Cozaar 25 mg daily, Aldactone 25 mg daily. Dapagliflozin 10mg  daily. Monitor for any signs of fluid overload.  Daily weights.  1-24: Weights with ongoing downtrend, per record patient has adequate p.o. intakes and compliant with nutrition shakes between meals, now with reports of orthostasis and blood pressure 80 over 50s this afternoon.  Reduce torsemide to 10 mg daily, and encourage p.o. fluids. - Orthostatic vitals in AM ordered -1/25-26/25 wt fairly stable; monitor 1/27: Increasingly  low blood pressure, will DC torsemide today and encourage p.o. fluids, initiate 500 cc IV fluids at 50 cc/h today.  BMP in AM. 1-28: Weights have stabilized today.  Continue monitoring for 1 to 2 days, then consider resumption of torsemide at lower dose. 1/29: Worsening orthostasis despite holding diuretic torsemide, no peripheral signs of fluid overload, although  weights have normalized at this point.  DC spironolactone 25 mg daily and Cozaar 25 mg daily, monitor closely.  Encourage p.o. fluids. 1/30: Continued orthostasis, weights stable and euvolemic; add IVF as above 1-31: Blood pressure mildly improved with IV fluid, but continues to be orthostatic.  Continue for today, continue to emphasize to patient encourage of p.o. fluid intake as well.  DC Farxiga per cardiology as above. 2-3: Blood pressures improved since DC Farxiga.  Continue current management.--Continues losing weight  daily 2-5: Weight stable/up 2-6: Weight stabilizing, blood pressure improving.  Due to recent strokes, will allow some higher blood pressures to maintain perfusion.  Still remains normovolemic.  Getting echocardiogram today. 2/7: Staying stable, no s/s volume overload, Echo today with EF 20-25% - improved from prior <20%. Would not overtreat HTN this weekend given significant challenges with maintaining BP and L ICA stenosis/stroke.   -04/22/23 - 10 BP fine, wt variable but looks good overall, monitor.  Filed Weights   04/21/23 0500 04/22/23 0456 04/23/23 0511  Weight: 50.5 kg 47.8 kg 49.4 kg   Vitals:   04/19/23 1422 04/19/23 1948 04/20/23 0523 04/20/23 1424  BP: 107/86 (!) 110/58 120/62 105/67   04/20/23 2048 04/21/23 0519 04/21/23 1716 04/21/23 1956  BP: (!) 113/56 (!) 112/53 (!) 118/54 118/63   04/22/23 0440 04/22/23 1637 04/22/23 2040 04/23/23 0430  BP: (!) 110/52 (!) 110/51 (!) 110/57 (!) 117/57    14.  Sacral wound.  Wound care nurse follow-up.  Covered with foam dressing -1-16: Caught himself on the back during a toileting transfer today per nursing, resulting in small wound, covered in barrier dressing.  15.  Dysphagia/severe protein calorie malnutrition.  Dysphagia #2 thin liquids.   -Cortrak on intake; Continue supplemental tube feeds as above, encourage p.o. intakes, protein. -1-10: Eating 60 to 90% of meals per documentation.  Discontinue core track, tube  feeds.--Continuing to eat well 1/28: P.o. intakes adequate per record, but cachectic appearance and continuing to lose weight.  Somewhat improved with adjustment of diuretics as above.  Will start mirtazapine for appetite stimulation. 1-29: Dietary evaluated, continue current management with BID ensure shakes 1/30: Continues poor PO intakes despite considerable encouragement; unfortunately not tolerating mirtazepine 1-31: Family bringing in food that he likes today.  Encouraged consumption of protein shakes, Magic cups between meals.   Has lost ~20 lbs since admission 1/8; some due to diuresis, eating 25-80% meals (avg 60%), continues to lose weight -  Nutrition onboard and assisting.    2-3: Weights continue to downtrend.  Increasing Ensure to 3 times daily between meals. DC Pepcid due to patient refusal. Adding appetite stimulant marinol 2.5 mg BID today.   2-4: P.o. intakes appear improved since starting Marinol.  Did change Ensure to boost breeze per patient preference. 2-5: Weight stable/up for the first time today.  Ate 100% of breakfast and 80% of dinner yesterday.  Albumin is uptrending per CMP.  Hopeful to start making gains in this area  2-6: Weights uptrending, eating better.  DC Marinol due to slightly worsening confusion of the last few days.  2/7: Maintaining POS off marinol. Continue encouragement.   16. Stool regimen: colace 200mg  daily  -1-17: DC liquid Colace, having regular bowel movements  -03/31/23 no BM in 3 days, will restart colace at 100mg  daily for now.  -04/01/23 pt states BM yesterday but none documented since 1/15, nurse documentation states LBM 1/16, unclear which is correct; asked nursing to please document if he's had BM today, if none today then would advance bowel regimen --1-20: Increase Colace to 100 mg twice daily, add Senokot S1 tab at night  -1/24 LBM  -04/07/23 no good BM since 1/21, just a smear on 1/24; hard palpable stool in lower abdomen, pt feels it in  rectum; will do sorbitol then mineral oil enema. Hopefully this improves it.  -04/08/23 multiple BMs yesterday with enema/meds, feels better; cont regimen -04/15/23 LBM 2d ago, cont regimen but if no BM by tomorrow, may want to add  miralax or increase senokot-- likely not eating enough to have daily BMs either...  2-3: Refusing stool softeners and Pepcid.  Move Senokot S to as needed 2/5: Last bowel movement recorded 1-31; patient reports bouts of diarrhea with protein shakes.  Will confirm with nursing today.  Given improved p.o. intakes, should start to pick up.  04/21/23 LBM 2 days ago, monitor for now, may need meds readded -04/22/23 LBM 3 days ago, if no BM by tomorrow may want to adjust bowel meds again  17. Thrombocytosis.  resolved  18. 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 Seen on MRI 2/5 for cognitive work up. Likely etiology large vessel dz (L ICA stenosis above + post-op hypotension).  - Neurology consulted, appreciate recs, patient without acute neuro changes this AM.  - Son Markham Jordan called, reviewed results, will d/w patient in AM with him over the phone.  2-6: Discussed findings with patient and his son [name] this a.m.  All questions answered.  Neurology and vascular surgery evaluating, getting echocardiogram and CTA today.  May need CEA more urgently than prior planned 1 month.  Appreciate neurology assistance and recommendations. -Appreciating some slight weakness in right upper extremity and ataxia, which has not been seen on prior exams.  Cognition remains poor, but no other gross deficits. 2/7: Neurology recommending Eliquis in addition to DAPT d/t EF <30% as above. Dr. Butch Penny planning for L CEA 2/12. Maintain BP and monitor neuro status. - Son Markham Jordan asking again why MRI was not done on admission since brought in with concerns for stroke; explained role of CT in acute settings, and the urgent nature of his cardiovascular intervention. Answered all  questions. Markham Jordan notes multiple instances prior to admission where patient acted "drunk", ?TIAs.  -04/22/23 much better cognitively this weekend than prior weekends.  19.  Anemia  - started recently with eliquis/heparin gtt. Ordered stool card. Start GI PPX with BID protonix 40 mg. H/h in AM.   LOS: 34 days A FACE TO FACE EVALUATION WAS PERFORMED  Angelina Sheriff 04/23/2023, 8:38 AM

## 2023-04-23 NOTE — Plan of Care (Signed)
  Problem: RH BOWEL ELIMINATION Goal: RH STG MANAGE BOWEL WITH ASSISTANCE Description: STG Manage Bowel with toileting Assistance. Outcome: Progressing   Problem: RH BLADDER ELIMINATION Goal: RH STG MANAGE BLADDER WITH ASSISTANCE Description: STG Manage Bladder With toileting Assistance Outcome: Progressing   Problem: RH SAFETY Goal: RH STG ADHERE TO SAFETY PRECAUTIONS W/ASSISTANCE/DEVICE Description: STG Adhere to Safety Precautions With cues Outcome: Progressing

## 2023-04-23 NOTE — Progress Notes (Signed)
 Occupational Therapy Session Note  Patient Details  Name: SATOSHI FOERST MRN: [id number] Date of Birth: 10/11/1945  Today's Date: 04/23/2023 OT Individual Time: 1100-1200 OT Individual Time Calculation (min): 60 min    Short Term Goals: Week 5:  OT Short Term Goal 1 (Week 5): STG= LTG in anticipation of SNF d/c  Skilled Therapeutic Interventions/Progress Updates:    Pt received sitting in the w/c with no c/o pain and agreeable to session. He stood from the w/c with CGA. He was confabulatory re events of last night, stating "I was up until 2 am helping the nurse with a task". He completed functional mobility into the bathroom with CGA using the RW. He required mod cueing for sequencing turn to the [facility]. He required cueing to doff LB clothing prior to sitting to use the bathroom. At one point he called OT into bathroom and stated "what is this cord for" referencing his IV line. He completed peri hygiene seated with close (S) and then in standing with CGA. He completed clothing management with min A. (+) urine and Bm void. He returned to his w/ c following with CGA- (S) using the RW, min cueing for pacing. OT managing IV line. He completed grooming tasks seated at the sink with set up assist. He asked OT for assist with shaving and trimming hair. He was able to sustain attention to task throughout task and appropriately ask OT to trim specific areas to his preference. He requested to return to supine in bed. Stand pivot with poor sequencing/motor planning, CGA. He was left supine with all needs met, bed alarm set.   Therapy Documentation Precautions:  Precautions Precautions: Fall, Sternal, Other (comment) Precaution Booklet Issued: No Precaution Comments: cortrak, sternal precautions Restrictions Weight Bearing Restrictions Per Provider Order: No RUE Weight Bearing Per Provider Order: Weight bearing as tolerated LUE Weight Bearing Per Provider Order: Weight bearing as tolerated  Therapy/Group:  Individual Therapy  Una Ganser 04/23/2023, 6:33 AM

## 2023-04-23 NOTE — Progress Notes (Signed)
 Occupational Therapy Session Note  Patient Details  Name: Walter Vargas MRN: [id number] Date of Birth: Jul 30, 1945  Today's Date: 04/23/2023 OT Individual Time: 0900-0930 OT Individual Time Calculation (min): 30 min    Short Term Goals: Week 4:  OT Short Term Goal 1 (Week 4): Pt will complete toileting tasks with CGA OT Short Term Goal 1 - Progress (Week 4): Not met OT Short Term Goal 2 (Week 4): Pt will complete LB dressing with CGA OT Short Term Goal 2 - Progress (Week 4): Not met OT Short Term Goal 3 (Week 4): Pt will properly place UE during sit <> stands with external cueing OT Short Term Goal 3 - Progress (Week 4): Not met  Skilled Therapeutic Interventions/Progress Updates:    Patient received seated in wheelchair, IV running.  Patient confused and disoriented.  Patient reports being up since 1am "running errands" with his nurse who was very busy.  Patient reoriented to situation.  Surgeon in to review time for surgery - Patient asking to write this down to help with recall.  Patient's breakfast tray brought during this timeframe.  Patient assisted to set up breakfast, and cueing for proper utensil use, e.g. using knife backwards - attempting to cut with handle.  Patient left up in wheelchair at end of session with safety belt in place and engaged and call bell/ personal items in reach.    Therapy Documentation Precautions:  Precautions Precautions: Fall, Sternal, Other (comment) Precaution Booklet Issued: No Precaution Comments: cortrak, sternal precautions Restrictions Weight Bearing Restrictions Per Provider Order: No RUE Weight Bearing Per Provider Order: Weight bearing as tolerated LUE Weight Bearing Per Provider Order: Weight bearing as tolerated   Pain:  Denies pain     Therapy/Group: Individual Therapy  Eather Chaires M 04/23/2023, 9:30 AM

## 2023-04-23 NOTE — Progress Notes (Addendum)
 PHARMACY - ANTICOAGULATION Pharmacy Consult for heparin   Indication:  cardiomyopathy with CVA  No Active Allergies  Patient Measurements: Height: 5\' 8"  (172.7 cm) Weight: 49.4 kg (108 lb 14.5 oz) IBW/kg (Calculated) : 68.4 Heparin  Dosing Weight: 48 kg  Vital Signs: Temp: 97.8 F (36.6 C) (02/10 0430) Temp Source: Oral (02/10 0430) BP: 117/57 (02/10 0430) Pulse Rate: 75 (02/09 2040)  Labs: - Hgb 12.9, platelet count 198 on 04/16/23  Estimated Creatinine Clearance: 54 mL/min (by C-G formula based on SCr of 0.68 mg/dL).   Assessment: 78 yo male with reduced EF and recent CVA, Eliquis  on hold, for heparin     Goal of Therapy:  Heparin  level 0.3-0.5 units/ml aPTT 66-85 seconds Monitor platelets by anticoagulation protocol: Yes   Plan:  Increase Heparin  750 units/hr Check aPTT in 8 hours    Yanuel Tagg, Sharlyn Deaner 04/23/2023,7:10 AM

## 2023-04-23 NOTE — Progress Notes (Signed)
 PHARMACY - ANTICOAGULATION CONSULT NOTE  Pharmacy Consult for Eliquis  > IV heparin  for procedure 2/12 Indication:  cardiomyopathy with CVA  No Active Allergies  Patient Measurements: Height: 5\' 8"  (172.7 cm) Weight: 49.4 kg (108 lb 14.5 oz) IBW/kg (Calculated) : 68.4 Heparin  Dosing Weight: ~50 kg  Labs: - Hgb 12.9, platelet count 198 on 04/16/23  Estimated Creatinine Clearance: 54 mL/min (by C-G formula based on SCr of 0.68 mg/dL).   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 Pharmacy consulted to dose Eliquis  on 04/20/23. CBC stable; no bleeding reported.   Eliquis  5 mg BID begun 2/7 pm. For L CEA on 04/25/23 am. Discussed with M. Mabelene Savannah, PA-C with [facility] this afternoon.  Eliquis  discontinued after 2/9 am dose. To begin IV heparin  bridge.   Will use aPTTs for monitoring while heparin  levels are expected to be falsely elevated from recent Eliquis  doses.  Low therapeutic target ranges with CVA.  Heparin  to begun 2/9 pm, about 12 hours after last Eliquis  dose (0914).   aPTT is now 59 seconds on 750 units/hr, just below target range after rate increase this morning. Hemoglobin 12.9 on 2.3 >11.2 today, platelet count 218, stable. No bleeding reported.   Goal of Therapy:  Heparin  level 0.3-0.5 units/ml aPTT 66-85 seconds Monitor platelets by anticoagulation protocol: Yes   Plan:  Increase Heparin  drip to 850 units/hr. Next labs in am. Daily aPTT and heparin  level until correlating; daily CBC. Eliquis  on hold for procedure 04/25/23. Will follow up for timing for holding heparin  drip pre-op 04/25/23.  Adolphus Akin, RPh 04/23/2023,5:33 PM

## 2023-04-23 NOTE — Progress Notes (Signed)
Occupational Therapy Discharge Summary  Patient Details  Name: Walter Vargas MRN: [id number] Date of Birth: 11-18-1945  Date of Discharge from OT service:April 24, 2023   Patient has met 7 of 7 long term goals due to improved activity tolerance, improved balance, postural control, ability to compensate for deficits, functional use of  RIGHT upper and LEFT upper extremity, improved awareness, and improved coordination.  Patient to discharge at [facility] Assist level. Walter Vargas has made physical gains in CIR and is CGA overall with ADL transfers and ADLs. His cognitive deficits are very inconsistent and limit his carryover and ability to live alone like he was PTA. Anticipate SNF d/c will be safest option post acute unless family can provide 24/7 supervision.   Recommendation:  Patient will benefit from ongoing skilled OT services in  acute care, and then SNF after discharge  to continue to advance functional skills in the area of BADL and Reduce care partner burden.  Equipment: No equipment provided  Reasons for discharge:  Discharge to acute for planned left carotid endarterectomy  Patient/family agrees with progress made and goals achieved: Yes  Skilled OT intervention:   OT Discharge Precautions/Restrictions  Restrictions RUE Weight Bearing Per Provider Order: Weight bearing as tolerated LUE Weight Bearing Per Provider Order: Weight bearing as tolerated   ADL ADL Eating: Supervision/safety Where Assessed-Eating: Bed level Grooming: Supervision/safety Where Assessed-Grooming: Sitting at sink Upper Body Bathing: Supervision/safety Where Assessed-Upper Body Bathing: Shower Lower Body Bathing: Contact guard Where Assessed-Lower Body Bathing: Shower Upper Body Dressing: Supervision/safety Where Assessed-Upper Body Dressing: Edge of bed Lower Body Dressing: Minimal assistance Where Assessed-Lower Body Dressing: Edge of bed Toileting: Minimal assistance Where  Assessed-Toileting: Teacher, adult education: Furniture conservator/restorer Method: Biomedical scientist Method: Designer, industrial/product: Sales promotion account executive Baseline Vision/History: 0 No visual deficits Patient Visual Report: No change from baseline Vision Assessment?: No apparent visual deficits Perception  Perception: Within Functional Limits Praxis Praxis: Impaired Praxis Impairment Details: Motor planning Cognition   Sensation Sensation Light Touch: Appears Intact Coordination Gross Motor Movements are Fluid and Coordinated: No Fine Motor Movements are Fluid and Coordinated: No Coordination and Movement Description: Generalized weakness Motor  Motor Motor: Within Functional Limits Mobility  Bed Mobility Bed Mobility: Sit to Supine;Supine to Sit Supine to Sit: Supervision/Verbal cueing Sit to Supine: Supervision/Verbal cueing Transfers Sit to Stand: Contact Guard/Touching assist Stand to Sit: Contact Guard/Touching assist  Trunk/Postural Assessment  Cervical Assessment Cervical Assessment: Exceptions to [facility] (forward head) Thoracic Assessment Thoracic Assessment: Exceptions to [facility] (rounded shoulders) Lumbar Assessment Lumbar Assessment: Exceptions to [facility] (posterior pelvic tilt) Postural Control Postural Control: Deficits on evaluation Righting Reactions: delayed  Balance Balance Balance Assessed: Yes Static Sitting Balance Static Sitting - Balance Support: Feet supported Static Sitting - Level of Assistance: 7: Independent Dynamic Sitting Balance Dynamic Sitting - Balance Support: Feet supported Dynamic Sitting - Level of Assistance: 6: Modified independent (Device/Increase time) Static Standing Balance Static Standing - Balance Support: During functional activity;Bilateral upper extremity supported Static Standing - Level of Assistance: 5: Stand by assistance Dynamic Standing  Balance Dynamic Standing - Balance Support: During functional activity;Bilateral upper extremity supported Dynamic Standing - Level of Assistance: 4: Min assist (CGA) Extremity/Trunk Assessment RUE Assessment RUE Assessment: Exceptions to [facility] General Strength Comments: Did not MMT 2/2 sternal precautions, grip WFL and reaching during ADLs WFL- generalized weakness LUE Assessment LUE Assessment: Exceptions to [facility] General Strength Comments: Did not MMT 2/2 sternal precautions, grip  WFL and reaching during ADLs WFL- generalized weakness   Walter Vargas 04/23/2023, 11:32 AM

## 2023-04-24 ENCOUNTER — Telehealth (HOSPITAL_COMMUNITY): Payer: Self-pay | Admitting: Pharmacy Technician

## 2023-04-24 ENCOUNTER — Ambulatory Visit: Payer: Self-pay

## 2023-04-24 ENCOUNTER — Other Ambulatory Visit (HOSPITAL_COMMUNITY): Payer: Self-pay

## 2023-04-24 LAB — CBC
HCT: 33.5 % — ABNORMAL LOW (ref 39.0–52.0)
Hemoglobin: 10.6 g/dL — ABNORMAL LOW (ref 13.0–17.0)
MCH: 28.4 pg (ref 26.0–34.0)
MCHC: 31.6 g/dL (ref 30.0–36.0)
MCV: 89.8 fL (ref 80.0–100.0)
Platelets: 212 10*3/uL (ref 150–400)
RBC: 3.73 MIL/uL — ABNORMAL LOW (ref 4.22–5.81)
RDW: 18.2 % — ABNORMAL HIGH (ref 11.5–15.5)
WBC: 4.8 10*3/uL (ref 4.0–10.5)
nRBC: 0 % (ref 0.0–0.2)

## 2023-04-24 LAB — HEPARIN LEVEL (UNFRACTIONATED)
Heparin Unfractionated: 0.56 [IU]/mL (ref 0.30–0.70)
Heparin Unfractionated: 0.62 [IU]/mL (ref 0.30–0.70)

## 2023-04-24 LAB — APTT
aPTT: 62 s — ABNORMAL HIGH (ref 24–36)
aPTT: 75 s — ABNORMAL HIGH (ref 24–36)

## 2023-04-24 MED ORDER — DIGOXIN 125 MCG PO TABS: 0.1250 mg | ORAL_TABLET | Freq: Every day | ORAL | Status: DC

## 2023-04-24 MED ORDER — HEPARIN (PORCINE) 25000 UT/250ML-% IV SOLN: 850.0000 [IU]/h | INTRAVENOUS | Status: DC

## 2023-04-24 MED ORDER — DOCUSATE SODIUM 50 MG/5ML PO LIQD: 100.0000 mg | Freq: Two times a day (BID) | ORAL | Status: DC

## 2023-04-24 MED ORDER — PANTOPRAZOLE SODIUM 40 MG PO TBEC: 40.0000 mg | DELAYED_RELEASE_TABLET | Freq: Two times a day (BID) | ORAL | Status: DC

## 2023-04-24 MED ORDER — ASCORBIC ACID 500 MG PO TABS: 500.0000 mg | ORAL_TABLET | Freq: Every day | ORAL | Status: DC

## 2023-04-24 MED ORDER — ASPIRIN 81 MG PO CHEW: 81.0000 mg | CHEWABLE_TABLET | Freq: Every day | ORAL | Status: DC

## 2023-04-24 MED ORDER — HYDROXYZINE HCL 25 MG PO TABS
25.0000 mg | ORAL_TABLET | Freq: Three times a day (TID) | ORAL | Status: DC | PRN
Start: 2023-04-24 — End: 2023-10-18

## 2023-04-24 MED ORDER — AMIODARONE HCL 200 MG PO TABS: 200.0000 mg | ORAL_TABLET | Freq: Every day | ORAL | Status: DC

## 2023-04-24 NOTE — Progress Notes (Addendum)
Patient ID: Walter Vargas, male   DOB: Dec 04, 1945, 78 y.o.   MRN: [id number]  Per attending, pt will discharge to OR tomorrow and will not return.   1534- SW left message for [name] informing above, and shared he will be assigned another SW who can help assist with coordinating next steps for SNF if this is what was decided and if they would like to use insurance and/or private pay. SW encouraged follow-up if needed.  [name], MSW, LCSW Office: [phone] Cell: [phone] Fax: [phone]

## 2023-04-24 NOTE — Progress Notes (Signed)
Speech Language Pathology Discharge Summary  Patient Details  Name: Walter Vargas MRN: [id number] Date of Birth: 1945/03/23  Date of Discharge from SLP service:April 24, 2023  Patient has met 3 of 6 long term goals.  Patient to discharge at [facility] Max;Mod level.  Reasons goals not met: severity of deficits   Clinical Impression/Discharge Summary: Patient has made moderate gains towards therapy goals during inpatient rehabilitation stay. Patient currently requires mod assist to recall of daily events overall, min assist for orientation using external calendar aid, supervision for tolerance of regular/thin liquid diet, and max assist for environmental problem solving and awareness of deficits. Patient would benefit from further SLP services at next venue of care to target lingering cognitive deficits. Patient and family education complete. SLP will sign off.    Care Partner:  Caregiver Able to Provide Assistance: Other (comment) (caregiver unable to state ability to provide assistance)  Type of Caregiver Assistance: Cognitive  Recommendation:  24 hour supervision/assistance;Skilled Nursing facility  Rationale for SLP Follow Up: Maximize cognitive function and independence   Equipment: n/a   Reasons for discharge: Other (comment) (discharged for surgery)   Patient/Family Agrees with Progress Made and Goals Achieved: Yes   Jeannie Done, M.A., CCC-SLP   Yetta Barre 04/24/2023, 10:31 AM

## 2023-04-24 NOTE — Progress Notes (Signed)
Physical Therapy Discharge Summary  Patient Details  Name: Walter Vargas MRN: [id number] Date of Birth: December 28, 1945  Date of Discharge from PT service:April 24, 2023  Today's Date: 04/24/2023 PT Individual Time: 9563-8756 PT Individual Time Calculation (min): 45 min    Patient has met 7 of 7 long term goals due to improved activity tolerance, improved balance, improved postural control, and increased strength.  Patient to discharge at an ambulatory level Supervision.   Patient's care partner  is unable to provide 24/7 supervision at this time, and therapy has recommended discharge to SNF  to provide the necessary physical and cognitive assistance at discharge.  Reasons goals not met: NA  Recommendation:  Patient will benefit from ongoing skilled PT services in skilled nursing facility setting to continue to advance safe functional mobility, address ongoing impairments in balance, strength, ambulation, cognition, and minimize fall risk.  Equipment: No equipment provided  Reasons for discharge:  Return to acute for surgery  Patient/family agrees with progress made and goals achieved: Yes  Skilled Therapeutic Interventions: Pt received seated in [facility] and agrees to therapy. Reports some pain in Lt elbow. Number not provided. PT provides rest breaks and mobility to manage pain. WC transport to gym. Pt completes ramp navigation and car transfer with RW and close supervision, with cues for safe AD management and sequencing. Pt ambulates x450' with RW and cues to increase proximity to RW for safety, as well as maintaining upright gaze to improve posture and balance. Following rest break, pt completes x12 6" steps with bilateral handrails and cues for safe step sequencing and body position. Following rest break, pt ambulates bout of x350' with RW and similar cues for safety. Pt left seated in WC with alarm intact and all needs within reach.   PT  Discharge Precautions/Restrictions Precautions Precautions: Fall Restrictions Weight Bearing Restrictions Per Provider Order: No Pain Interference Pain Interference Pain Effect on Sleep: 1. Rarely or not at all Pain Interference with Therapy Activities: 2. Occasionally Pain Interference with Day-to-Day Activities: 2. Occasionally Vision/Perception  Vision - History Ability to See in Adequate Light: 0 Adequate Perception Perception: Within Functional Limits Praxis Praxis: Impaired Praxis Impairment Details: Motor planning  Cognition Overall Cognitive Status: Impaired/Different from baseline Arousal/Alertness: Awake/alert Orientation Level: Oriented to person;Oriented to place Sustained Attention: Impaired Memory: Impaired Awareness: Impaired Problem Solving: Impaired Organizing: Impaired Self Correcting: Impaired Behaviors: Perseveration Safety/Judgment: Impaired Sensation Sensation Light Touch: Appears Intact Coordination Gross Motor Movements are Fluid and Coordinated: No Fine Motor Movements are Fluid and Coordinated: No Coordination and Movement Description: Generalized weakness Motor  Motor Motor: Within Functional Limits  Mobility Bed Mobility Bed Mobility: Sit to Supine;Supine to Sit Supine to Sit: Supervision/Verbal cueing Sit to Supine: Supervision/Verbal cueing Transfers Transfers: Sit to Stand;Stand to Sit;Stand Pivot Transfers Sit to Stand: Supervision/Verbal cueing Stand to Sit: Supervision/Verbal cueing Stand Pivot Transfers: Supervision/Verbal cueing Stand Pivot Transfer Details: Verbal cues for precautions/safety;Verbal cues for gait pattern;Verbal cues for safe use of DME/AE;Verbal cues for technique Transfer (Assistive device): Rolling walker Locomotion  Gait Ambulation: Yes Gait Assistance: Supervision/Verbal cueing Gait Distance (Feet): 300 Feet Assistive device: Rolling walker Gait Assistance Details: Verbal cues for sequencing;Verbal  cues for gait pattern;Verbal cues for safe use of DME/AE Gait Gait: Yes Gait Pattern: Impaired Gait Pattern: Narrow base of support Gait velocity: Decreased Stairs / Additional Locomotion Stairs: Yes Stairs Assistance: Supervision/Verbal cueing Stair Management Technique: Two rails Number of Stairs: 12 Height of Stairs: 6 Ramp: Supervision/Verbal cueing Curb: Supervision/Verbal cueing Pick up small  object from the floor assist level: Minimal Assistance - Patient > 75% Wheelchair Mobility Wheelchair Mobility: No  Trunk/Postural Assessment  Cervical Assessment Cervical Assessment:  (forward head) Thoracic Assessment Thoracic Assessment:  (rounded shoulders) Lumbar Assessment Lumbar Assessment:  (posterior pelvic tilt) Postural Control Postural Control: Deficits on evaluation Righting Reactions: delayed  Balance Balance Balance Assessed: Yes Static Sitting Balance Static Sitting - Balance Support: Feet supported Static Sitting - Level of Assistance: 7: Independent Dynamic Sitting Balance Dynamic Sitting - Balance Support: Feet supported Dynamic Sitting - Level of Assistance: 6: Modified independent (Device/Increase time) Static Standing Balance Static Standing - Balance Support: During functional activity;Bilateral upper extremity supported Static Standing - Level of Assistance: 5: Stand by assistance Dynamic Standing Balance Dynamic Standing - Balance Support: During functional activity;Bilateral upper extremity supported Dynamic Standing - Level of Assistance: 5: Stand by assistance Extremity Assessment  RLE Assessment RLE Assessment: Exceptions to [facility] General Strength Comments: Grossly 4/5 LLE Assessment LLE Assessment: Exceptions to [facility] General Strength Comments: Grossly 4/5   Beau Fanny, PT, DPT 04/24/2023, 4:13 PM

## 2023-04-24 NOTE — Progress Notes (Signed)
PHARMACY - ANTICOAGULATION CONSULT NOTE  Pharmacy Consult for Eliquis > IV heparin for procedure 2/12 Indication:  cardiomyopathy with CVA  No Active Allergies  Patient Measurements: Height: 5\' 8"  (172.7 cm) Weight: 51.2 kg (112 lb 14 oz) IBW/kg (Calculated) : 68.4 Heparin Dosing Weight: ~51 kg  Labs: - Hgb 12.9, platelet count 198 on 04/16/23  Estimated Creatinine Clearance: 56 mL/min (by C-G formula based on SCr of 0.68 mg/dL).   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 Pharmacy consulted to dose Eliquis on 04/20/23. CBC stable; no bleeding reported.   Eliquis 5 mg BID begun 2/7 pm. For L CEA on 04/25/23 am. Discussed with M. Cynda Acres, PA-C with [facility] this afternoon.  Eliquis discontinued after 2/9 am dose. To begin IV heparin bridge.   Will use aPTTs for monitoring while heparin levels are expected to be falsely elevated from recent Eliquis doses.  Low therapeutic target ranges with CVA.  Heparin begun 2/9 pm, about 12 hours after last Eliquis dose (6962).   aPTT is now at goal (75 seconds) on 900 units/hr. Heparin level 0.62 therapeutic, but likely still somewhat falsely elevated due to recent Eliquis doses. Hemoglobin has trended down to 10.6, platelet count stable at 212. No bleeding reported.   Goal of Therapy:  Heparin level 0.3-0.5 units/ml aPTT 66-85 seconds Monitor platelets by anticoagulation protocol: Yes   Plan:  Continue Heparin drip at 900 units/hr. Heparin drip to be held on call to OR 2/12, per d/w C. Baglia, PA-C. Will follow up am labs, and post-op anticoagulation plans. Daily aPTT and heparin level until correlating; daily CBC. Eliquis on hold for procedure 04/25/23.  Dennie Fetters, [place] 04/24/2023,6:04 PM

## 2023-04-24 NOTE — Progress Notes (Signed)
PROGRESS NOTE   Subjective/Complaints:  No events ovenright. No complaints. No POs recorded yesterday, minimal Sunday  LBM 2/10  ROS: Denies fevers, chills,  abd pain, vomiting, diarrhea, SOB, cough, chest pain, new weakness or paraesthesias.   + confusion, ongoing  + anxiety--ongoing  Objective:   No results found.    Recent Labs    04/23/23 0601 04/24/23 0514  WBC 5.3 4.8  HGB 11.2* 10.6*  HCT 34.8* 33.5*  PLT 218 212        Recent Labs    04/23/23 0601  NA 138  K 4.2  CL 98  CO2 25  GLUCOSE 88  BUN 19  CREATININE 0.68  CALCIUM 9.4       Intake/Output Summary (Last 24 hours) at 04/24/2023 6962 Last data filed at 04/23/2023 1700 Gross per 24 hour  Intake 124.22 ml  Output --  Net 124.22 ml      Pressure Injury 03/15/23 Coccyx Mid;Lower Stage 2 -  Partial thickness loss of dermis presenting as a shallow open injury with a red, pink wound bed without slough. (Active)  03/15/23 0800  Location: Coccyx  Location Orientation: Mid;Lower  Staging: Stage 2 -  Partial thickness loss of dermis presenting as a shallow open injury with a red, pink wound bed without slough.  Wound Description (Comments):   Present on Admission: Yes    Physical Exam: Vital Signs Blood pressure (!) 110/56, pulse (!) 59, temperature 98.1 F (36.7 C), temperature source Oral, resp. rate 16, height 5\' 8"  (1.727 m), weight 51.2 kg, SpO2 100%.  Constitution: No apparent distress. Cachectic appearance.  Sitting up in gym.  HENT: MMM.  No deformities. Resp:   clear to auscultation bilaterally.  On room air, no respiratory distress. CTAB Cardio: Bradycardic rate, reg rhythm.   Chronic bilateral upper and lower extremity skin changes.  Capillary refill brisk in bilateral upper extremities; palpable peripheral pulses. Abdomen: soft, Nondistended. Nontender,  +BS throughout, normoactive  Psych: Anxious mood. Not perseverating on  anything today.  Skin: + Right inner thigh/calf with scabbing from vein harvest site - healed  + confluent erythema on extensor surfaces bilateral upper extremities--now extending more proximally to elbows--also appreciated in feet 2-10  + Bilateral upper extremity bruising, unchanged  MSK: Severe lumbar levoscoliosis.  Right shoulder range of motion deficits ~90 degrees abduction.   Neurologic Exam:  Awake, alert, and oriented to self, place; not oriented to time - ongoing +  severe memory and higher cognitive deficits  -seems stable.  No cranial nerve deficits.   Right upper extremity 5 - throughout, left upper extremity 5 out of 5 throughout, bilateral lower extremities 5 out of 5 throughout. Sensation intact.  Slight right upper extremity ataxia on finger-to-nose. +resting tremor BL UE Minimal changes 2-10  Assessment/Plan: 1. Functional deficits which require 3+ hours per day of interdisciplinary therapy in a comprehensive inpatient rehab setting. Physiatrist is providing close team supervision and 24 hour management of active medical problems listed below. Physiatrist and rehab team continue to assess barriers to discharge/monitor patient progress toward functional and medical goals  Care Tool:  Bathing    Body parts bathed by patient: Right arm, Left arm,  Chest, Abdomen, Front perineal area, Buttocks, Right upper leg, Left upper leg, Face, Right lower leg, Left lower leg   Body parts bathed by helper: Right lower leg, Left lower leg     Bathing assist Assist Level: Supervision/Verbal cueing     Upper Body Dressing/Undressing Upper body dressing   What is the patient wearing?: Pull over shirt    Upper body assist Assist Level: Supervision/Verbal cueing    Lower Body Dressing/Undressing Lower body dressing      What is the patient wearing?: Pants, Underwear/pull up     Lower body assist Assist for lower body dressing: Minimal Assistance - Patient > 75%      Toileting Toileting    Toileting assist Assist for toileting: Minimal Assistance - Patient > 75%     Transfers Chair/bed transfer  Transfers assist     Chair/bed transfer assist level: Contact Guard/Touching assist     Locomotion Ambulation   Ambulation assist      Assist level: Contact Guard/Touching assist Assistive device: Walker-rolling Max distance: 150+   Walk 10 feet activity   Assist  Walk 10 feet activity did not occur: Safety/medical concerns  Assist level: Contact Guard/Touching assist Assistive device: Walker-rolling   Walk 50 feet activity   Assist Walk 50 feet with 2 turns activity did not occur: Safety/medical concerns  Assist level: Contact Guard/Touching assist Assistive device: Walker-rolling    Walk 150 feet activity   Assist Walk 150 feet activity did not occur: Safety/medical concerns  Assist level: Contact Guard/Touching assist Assistive device: Walker-rolling    Walk 10 feet on uneven surface  activity   Assist Walk 10 feet on uneven surfaces activity did not occur: Safety/medical concerns         Wheelchair     Assist Is the patient using a wheelchair?: Yes Type of Wheelchair: Manual Wheelchair activity did not occur:  (Sternal precautions)  Wheelchair assist level: Dependent - Patient 0% Max wheelchair distance: 150'    Wheelchair 50 feet with 2 turns activity    Assist    Wheelchair 50 feet with 2 turns activity did not occur: Safety/medical concerns       Wheelchair 150 feet activity     Assist  Wheelchair 150 feet activity did not occur: Safety/medical concerns       Blood pressure (!) 110/56, pulse (!) 59, temperature 98.1 F (36.7 C), temperature source Oral, resp. rate 16, height 5\' 8"  (1.727 m), weight 51.2 kg, SpO2 100%.  Medical Problem List and Plan: 1. 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  Sternal precautions             -patient may shower -ELOS/Goals: 14-18 days supervision to mod I-- Placement/TBD  - 1/23: Per patient/family request, pursuing SNF placement due to inability of caregivers to provide 24/7 support with ongoing min assist ADLs and cognitive deficits requiring assistance   -Stable to continue CIR 1/14: Needs to be independent at discharge per SW; son has moved out of state and can maybe provide 1-2 weeks on discharge. Patient's son's GF has provided some support in the hospital. Carryover very poor; Max-total assist with sternal precautions, Min A if able to push up. Difficulty with LBD and toiletting tasks - will forget what he is doing with toileting tasks. Per PT has been needing lots of help at home too. Per SW presenting like dementia cog-wise. Doing well with Dys 3. Will need assistance at discharge; SW  to discuss with family.  1-16: Discussed patient dispo requirements with son [name], emphasizing need for full supervision at discharge and anticipating will be greater than a 1 to 2 months needed.  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  After discussion with social work, family still unsure who will come in for training, will likely provide supervision for 2 weeks at home prior to deciding whether patient needs more oversight. 1/21: At bedtime incontinence per nursing. Per SW family not agreeing to care plan involving [name]/her mother, which was offered; this is consistent with patient's wishes as well. Unsure of dispo plan at this time as family is not offering alternatives.  1-22: Family discussed appealing discharge with social work due to feeling patient is not medically optimized.  Per team, primary barriers remain cognitive, not expected to improve further with additional time. 1/28: Per SLP, patient panicked with whole  medications even though he complains of crushed; he prefers them to be "dust". Markham Jordan is coming next week. Upgraded to regular diet. Ongoing evening behaviors.  2/4: SW has been unable to contact [place] since last week. Awaiting medicaid approval. SW called daughter yesterday, she is skeptical that patient has cognitive deficits limiting independence. Per PT/OT supervision to CGA, with ongoing behavior issues. SLP supervision for swallowing - Mod A for basic tasks with SLP. Will make 15/7 due to limited gains.  2/5: attempted to call Markham Jordan regarding update for MRI findings and psych consult; went to VM, message left that I will call back between 8-9 AM tomorrow 2-6: Discussed with [name], patient findings on MRI consistent with subacute and acute strokes.  Undergoing neurology and vascular workup for possible interventions.  Medical hold on discharge until workup complete. 2/7: Vascular surgery Dr. Lenell Antu discussing with family possible inpatient CEA vs. TCAR early next week -- planning for CEA 7/12   2.  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  Venous Doppler studies negative--intermittently refusing daily Lovenox. - ambulation of 450 ft with close SPV yesterday; DC lovenox 2/4  -2-6: Starting DAPT per neurology/vascular due to stroke.  Pending echocardiogram, may need DOAC. -2-7: EF <30 %, will need AC per neurology, plan for Eliquis starting tonight once confirmed by Neuro and will place hold day for pre-op per Vascular.   - 2/10: Heparin bridge per pharmacy   3. Pain Management: Oxycodone as needed--no complaints  4. Mood/Behavior/Sleep: Provide emotional support             - antipsychotic agents: Seroquel 25 mg daily at 1000 and 50 mg nightly   - Continue current regimen, can add melatonin 5 mg as needed  -1-13: Will start to wean off antipsychotics; DC AM Seroquel for tomorrow 1-15: Patient tolerated well, change nightly Seroquel  to as needed--has resulted in somewhat interrupted sleep but so far has been tolerable.  Noel Christmas states he gets anxious at night sometimes regarding dispo issues. 1/17: No PRNs used.  Continue melatonin, will add nightly as needed trazodone 50 mg retime at bedtime medications to 8 PM given this is his normal bedtime--improved 1-20: Family reporting compulsive calls, anxieties in the early evenings between therapies and sleep.  Patient is sleeping well, no hallucinations or signs of delirium at this time.  Likely early sundowning, adding Atarax for anxiety and informed nursing to be aware of these behaviors. 04-03-21: No reports by nursing  overnight, no use of as needed Atarax 1-24: Increasing anxiety and confused behavior since family elected going to SNF; encouraged use of PRN Atarax, start BuSpar 5 mg twice daily 1-28: Adding mirtazapine 7.5 mg nightly for sleep, appetite stimulation, and ongoing anxiety reported by staff. 1/29: DC buspar due to no effect, addition of mirtazepine.  1/30: DC mirtazepine due to persistent orthostasis  1/31: Endorses he slept well overnight, continue current management -04/14/23 endorses poor sleep but isn't really sure; monitor for now 2-3: Refused melatonin overnight.  Moved to as needed 2/7: Remains very anxious--with improvements in BP could start adding back standing medications, will do PRN atarax for this weekend  5. Neuropsych/cognition: This patient is not capable of making decisions on his own behalf.  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  Weaning sedating medications as above.  Will start Aricept 5 mg nightly-theoretical risk of arrhythmia, however on literature review cardiac events matched placebo on meta-analysis. 1-15: Tolerating Aricept well--and noticing some mild cognitive improvements 2-3: Psychiatry consulted for capacity evaluation,  determined patient does not currently have capacity and cannot sign himself out AMA. Appreciate their insiight and assitance.  2-4: Ordering MRI brain, labs including TSH, folate, B12, thiamine for neurologic contributors to cognitive deficits.  Low suspicion of stroke given no gross motor or sensory deficits.  Patient has as needed Xanax if needed for MRI. 2-5: TSH elevated, other labs stable.  Parameters showing subclinical hypothyroidism, will defer treatment for now.  MRI showing left corona radiata acute and subacute strokes.  See below.  6. Skin/Wound Care: Routine skin checks. -His right lower extremity harvest site completing course of Ancef for cellulitis --1/8: transitioned to Augmentin 1-8 to complete 10-day course--appearance slightly more edematous/erythematous 1-14, will get WBC in a.m. and monitor closely, still staying within margins of prior marking so may just be taking a long time to resolve 1-13: DC PICC line--site looks clean. -1-17: Right clavicle staples removed.  Remove abdominal sutures today. - 1/21: RLE swelling/infection resolved 1-22: Dry skin overlying prior infection site; add Eucerin daily--appearance improving 2/3: DC PIV   - 2/5: Confluent rash on BL UE this AM; add PRN benadryll - 2-6: Ongoing bilateral upper extremity erythema, nonpainful, does not look like a rash; mildly delayed capillary refill with good peripheral pulses, ?raynaud's per patient self endorsed history of similar events popping up and going within a few days. Continue to monitor. --seems improving 2/9--also present in feet 2-10   7. Fluids/Electrolytes/Nutrition/hyponatremia: Routine in and outs with follow-up chemistries, continue supplements.  -1-8: Low albumin, protein; continue current supplemental tube feeds as below.  Encourage protein and p.o. intakes.  Very mild hyponatremia, will recheck on Friday. 1-10: Hyponatremia 129, slight downtrend from 130.  Repeat BMP, serum osmol, urine  studies in a.m. to confirm etiology--will be somewhat complicated by use of torsemide.  Placed on 1200 cc fluid restriction; patient is remaining well below this. -03/24/23 Na up to 132, SOsm WNL, urine studies not done yet -03/25/23 Urine Osm still not done... scheduled weekly labs starting Monday - 1/13: Na improved/stable - repeat Wednesday--add magnesium to today's labs--2.1, stable 1-15: NA stable 131; add calcium supplementation, PRN tums, and increase protonic to 40 mg BID for at bedtime nausea 1-17: Added as needed Zofran 4 mg every 8 hours, and miscellaneous nursing order to try and divide out medications and not give all at once crushed in applesauce due to intolerance of foul taste  1-20: Only 1 use of Zofran over the weekend, no recorded emesis --still having nausea around crushed pills only -04/08/23 BMP done yesterday afternoon, Na 132 stable, BUN 47 and Cr 1.44 which is up-- encourage PO fluids today, recheck tomorrow; will hold off on starting fluids just yet.  1-27: Sodium remains downtrending 129, BUN and creatinine slightly improved.  P.o. intakes appear low Is.  Start 500 cc IV fluids today, repeat in AM. 1-28: Sodium improved to 131 with IV fluids and DC torsemide.  Continue regular monitoring of sodium 1-30: Na back down to 128; DC BP medications, DC mirtazepine, start IVF NS 50 cc/hr for 1 day. CMP in AM. Now off confounding meds will plan for urine studies in AM to eval for SIADH 1-31: NA back to 131.  Unfortunately, still orthostatic, continue IV fluids.  Serum osmolality within normal limits, awaiting urine studies for full picture.-- UOsm 666 WNL, UNa 77--concentrating appropriately 2-3: Sodium improved 133 off of IV fluids.--Remaining stable 2-4  8.  Acute blood loss anemia.  Follow-up CBC -stable   - HgB improving/stable  9.  COVID-positive.  Completed Paxlovid  -1-17: DC schedule guaifenesin.  10.  Carotid artery stenosis.  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  Seen by vascular surgery -- see #18  11.  History of VT peri-CABG.  Amiodarone 200 mg BID>>daily, Lanoxin 0.125 mg daily  -Remains with low/stable heart rate -1-14: Patient reporting irregular palpitations during therapies.  None documented, made PT aware so they can document any events during their therapies today.--did not occur per therapies 1/14 1-29: Adjusting blood pressure medications as below; may need to adjust amiodarone to 100 mg daily if no improvement with adjustment of diuretics--ongoing 1-31: Reached out to cardiology regarding ongoing orthostasis with multiple recent medication adjustments.  They have recommended discontinuation of Farxiga, as this can cause orthostasis, also possible contributed to hyperkalemia.  Will do this today, continue other medications at current dose 2-3: Vitals appear improved.  Heart rate staying stable.   12.  Hyperlipidemia.  Lipitor 80mg  daily 13.  Diastolic congestive heart failure.  Torsemide 20 mg daily, Cozaar 25 mg daily, Aldactone 25 mg daily. Dapagliflozin 10mg  daily. Monitor for any signs of fluid overload.  Daily weights.  1-24: Weights with ongoing downtrend, per record patient has adequate p.o. intakes and compliant with nutrition shakes between meals, now with reports of orthostasis and blood pressure 80 over 50s this afternoon.  Reduce torsemide to 10 mg daily, and encourage p.o. fluids. - Orthostatic vitals in AM ordered -1/25-26/25 wt fairly stable; monitor 1/27: Increasingly  low blood pressure, will DC torsemide today and encourage p.o. fluids, initiate 500 cc IV fluids at 50 cc/h today.  BMP in AM. 1-28: Weights have stabilized today.  Continue monitoring for 1 to 2 days, then consider resumption of torsemide at lower dose. 1/29: Worsening orthostasis despite holding diuretic torsemide, no peripheral signs of fluid overload, although weights have normalized at this point.  DC spironolactone 25 mg daily and Cozaar 25 mg  daily, monitor closely.  Encourage p.o. fluids. 1/30: Continued orthostasis, weights stable and euvolemic; add IVF as above 1-31: Blood pressure mildly improved with IV fluid, but continues to be orthostatic.  Continue for today, continue to emphasize to patient encourage of p.o. fluid intake as well.  DC Farxiga per cardiology as above. 2-3: Blood pressures improved since DC Farxiga.  Continue current management.--Continues losing weight daily 2-5: Weight stable/up 2-6: Weight stabilizing, blood pressure improving.  Due  to recent strokes, will allow some higher blood pressures to maintain perfusion.  Still remains normovolemic.  Getting echocardiogram today. 2/7: Staying stable, no s/s volume overload, Echo today with EF 20-25% - improved from prior <20%. Would not overtreat HTN this weekend given significant challenges with maintaining BP and L ICA stenosis/stroke.   -04/22/23 - 10 BP fine, wt variable but looks good overall, monitor.  Filed Weights   04/22/23 0456 04/23/23 0511 04/24/23 0526  Weight: 47.8 kg 49.4 kg 51.2 kg   Vitals:   04/20/23 1424 04/20/23 2048 04/21/23 0519 04/21/23 1716  BP: 105/67 (!) 113/56 (!) 112/53 (!) 118/54   04/21/23 1956 04/22/23 0440 04/22/23 1637 04/22/23 2040  BP: 118/63 (!) 110/52 (!) 110/51 (!) 110/57   04/23/23 0430 04/23/23 2114 04/23/23 2114 04/24/23 0510  BP: (!) 117/57 115/66 115/66 (!) 110/56    14.  Sacral wound.  Wound care nurse follow-up.  Covered with foam dressing -1-16: Caught himself on the back during a toileting transfer today per nursing, resulting in small wound, covered in barrier dressing.  15.  Dysphagia/severe protein calorie malnutrition.  Dysphagia #2 thin liquids.   -Cortrak on intake; Continue supplemental tube feeds as above, encourage p.o. intakes, protein. -1-10: Eating 60 to 90% of meals per documentation.  Discontinue core track, tube feeds.--Continuing to eat well 1/28: P.o. intakes adequate per record, but cachectic  appearance and continuing to lose weight.  Somewhat improved with adjustment of diuretics as above.  Will start mirtazapine for appetite stimulation. 1-29: Dietary evaluated, continue current management with BID ensure shakes 1/30: Continues poor PO intakes despite considerable encouragement; unfortunately not tolerating mirtazepine 1-31: Family bringing in food that he likes today.  Encouraged consumption of protein shakes, Magic cups between meals.   Has lost ~20 lbs since admission 1/8; some due to diuresis, eating 25-80% meals (avg 60%), continues to lose weight -  Nutrition onboard and assisting.    2-3: Weights continue to downtrend.  Increasing Ensure to 3 times daily between meals. DC Pepcid due to patient refusal. Adding appetite stimulant marinol 2.5 mg BID today.   2-4: P.o. intakes appear improved since starting Marinol.  Did change Ensure to boost breeze per patient preference. 2-5: Weight stable/up for the first time today.  Ate 100% of breakfast and 80% of dinner yesterday.  Albumin is uptrending per CMP.  Hopeful to start making gains in this area  2-6: Weights uptrending, eating better.  DC Marinol due to slightly worsening confusion of the last few days.  2/7: Maintaining POS off marinol. Continue encouragement.   16. Stool regimen: colace 200mg  daily  -1-17: DC liquid Colace, having regular bowel movements  -03/31/23 no BM in 3 days, will restart colace at 100mg  daily for now.  -04/01/23 pt states BM yesterday but none documented since 1/15, nurse documentation states LBM 1/16, unclear which is correct; asked nursing to please document if he's had BM today, if none today then would advance bowel regimen --1-20: Increase Colace to 100 mg twice daily, add Senokot S1 tab at night  -1/24 LBM  -04/07/23 no good BM since 1/21, just a smear on 1/24; hard palpable stool in lower abdomen, pt feels it in rectum; will do sorbitol then mineral oil enema. Hopefully this improves it.   -04/08/23 multiple BMs yesterday with enema/meds, feels better; cont regimen -04/15/23 LBM 2d ago, cont regimen but if no BM by tomorrow, may want to add miralax or increase senokot-- likely not eating enough to have daily BMs  either...  2-3: Refusing stool softeners and Pepcid.  Move Senokot S to as needed 2/5: Last bowel movement recorded 1-31; patient reports bouts of diarrhea with protein shakes.  Will confirm with nursing today.  Given improved p.o. intakes, should start to pick up.  04/21/23 LBM 2 days ago, monitor for now, may need meds readded -04/22/23 LBM 3 days ago, if no BM by tomorrow may want to adjust bowel meds again  17. Thrombocytosis.  resolved  18. 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 Seen on MRI 2/5 for cognitive work up. Likely etiology large vessel dz (L ICA stenosis above + post-op hypotension).  - Neurology consulted, appreciate recs, patient without acute neuro changes this AM.  - Son Markham Jordan called, reviewed results, will d/w patient in AM with him over the phone.  2-6: Discussed findings with patient and his son [name] this a.m.  All questions answered.  Neurology and vascular surgery evaluating, getting echocardiogram and CTA today.  May need CEA more urgently than prior planned 1 month.  Appreciate neurology assistance and recommendations. -Appreciating some slight weakness in right upper extremity and ataxia, which has not been seen on prior exams.  Cognition remains poor, but no other gross deficits. 2/7: Neurology recommending Eliquis in addition to DAPT d/t EF <30% as above. Dr. Butch Penny planning for L CEA 2/12. Maintain BP and monitor neuro status. - Son Markham Jordan asking again why MRI was not done on admission since brought in with concerns for stroke; explained role of CT in acute settings, and the urgent nature of his cardiovascular intervention. Answered all questions. Markham Jordan notes multiple instances prior to admission where patient  acted "drunk", ?TIAs.  -04/22/23 much better cognitively this weekend than prior weekends.  19.  Anemia  - started recently with eliquis/heparin gtt. Ordered stool card. Start GI PPX with BID protonix 40 mg. H/h in AM.   LOS: 35 days A FACE TO FACE EVALUATION WAS PERFORMED  Angelina Sheriff 04/24/2023, 9:04 AM

## 2023-04-24 NOTE — Patient Care Conference (Signed)
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 Elijah Birk Social Worker Present: [name], LCSWA Nurse Present: Chana Bode, Janina Mayo, RN PT Present: Malachi Pro, PT OT Present: Jake Shark, OT SLP Present: Jeannie Done, SLP PPS Coordinator present : Fae Pippin, SLP     Current Status/Progress Goal Weekly Team Focus  Bowel/Bladder   continent with episodes of incontinences: LBM: 2/10   take daily bowel regiment to regualte bowel movements   assist with toileting needs prn    Swallow/Nutrition/ Hydration   regular/thin liquid diet. Supervision. At/above goal level   minA for use of strategies  continued diet tolerance    ADL's   (S) UB ADLs, min A LB, CGA transfers and mobility, no real functional change   CGA ADLs and transfers   ADLs, transfers, functional mobility, activity tolerance, cognition    Mobility   supervision to CGA all mobility, ambulating >300' with RW   Supervision  DC prep    Communication                Safety/Cognition/ Behavioral Observations  mod assist overall for basic tasks, continues to exhibit slow and mild improvements overall.  Participation has improved. Can be up to max assist for some aspects of evironmental problem solving and is total assist for awareness of deficits.   mod assist   attention, carryover, memory notebook use, intellectual awareness, orientation    Pain   no c/o pain   remain pain free   assess pain QS and prn    Skin   ecchymosis and errythemia on bil UE; skin tear on L elbow Q drsg   continue drg changes per MD orders; remain free of new skin breakdown/infection  assess skin QS and prn      Discharge Planning:  Pt to OR tomorrow. D/c is pending skilled bed offer and then insruance approval; because no LTC Mediciad in place this serves as barrier for placement and also no discharge plan. if unable to get bed, pt will transition to home. Pt needs to discharge to home Mod I as there is limited support at discharge. Pt son will come around 2/6 or 2/7 to visit with pt and will not be here long. SW emailed pt son Medicaid application; Medicaid application also left in room for pt family friends to assist. HHPT/OT/SLP/aideRN with [facility]- concerned about pt discharging to home without 24/7 care and may likely rescind offer. SW will confirm there are no barriers to discharge.   Team Discussion: Patient admitted post post cardiogenic shock with cardiac debility.Plan is for left Carotid Endarterectomy tomorrow 04/25/23 with Dr. Lenell Antu. Patient is on a heparin drip and noted to have bruising.  Overall, cognition has  improved.   Patient on target to meet rehab goals: yes,  patient on target with his goals  *See Care Plan and progress notes for long and short-term goals.   Revisions to Treatment Plan:  N/a  Teaching Needs: Safety,Medications, transfers, dietary modifications,etc  Current Barriers to Discharge: Decreased caregiver support and Insurance for SNF coverage  Possible Resolutions to Barriers: Family Education LTC Medicaid pending Home health PT/OT/SLP/aide RN DME: [facility]      Medical Summary Current Status: medically complicated by cognitive deficits, L MCA strokes, heart failure, severe malnutrition, wound care, and incontinence  Barriers to Discharge: Behavior/Mood;Cardiac Complications;Electrolyte abnormality;Inadequate Nutritional Intake;Medical stability;Self-care education;Incontinence;Hypotension;Pending surgery/plan   Possible Resolutions to [employer]: daily weight and regular monitoring of volume status, getting CEA with vascular tomorrow, monitoring of wounds, frequent reorientation, encouragement of nutirional intakes, and monitorring on heparin drip   Continued Need for Acute Rehabilitation Level of Care: The patient requires daily medical management by a physician with specialized training in physical medicine and rehabilitation for the following reasons: Direction of a multidisciplinary physical rehabilitation program to maximize functional independence : Yes Medical management of patient stability for increased activity during participation in an intensive rehabilitation regime.: Yes Analysis of laboratory values and/or radiology reports with any subsequent need for medication adjustment and/or medical intervention. : Yes   I attest that I was present, lead the team conference, and concur with the assessment and plan of the team.   Konrad Dolores [facility] 04/24/2023, 1:00 PM

## 2023-04-24 NOTE — Progress Notes (Signed)
PHARMACY - ANTICOAGULATION CONSULT NOTE  Pharmacy Consult for Eliquis > IV heparin for procedure 2/12 Indication:  cardiomyopathy with CVA  No Active Allergies  Patient Measurements: Height: 5\' 8"  (172.7 cm) Weight: 51.2 kg (112 lb 14 oz) IBW/kg (Calculated) : 68.4 Heparin Dosing Weight: ~50 kg  Labs: - Hgb 12.9, platelet count 198 on 04/16/23  Estimated Creatinine Clearance: 56 mL/min (by C-G formula based on SCr of 0.68 mg/dL).   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 Pharmacy consulted to dose Eliquis on 04/20/23. CBC stable; no bleeding reported.   Eliquis 5 mg BID begun 2/7 pm. For L CEA on 04/25/23 am. Discussed with Antony Blackbird, PA-C with [facility] on 2/9 afternoon.  Eliquis discontinued after 2/9 am dose. To begin IV heparin bridge 2/9.   Will use aPTTs for monitoring while heparin levels are expected to be falsely elevated from recent Eliquis doses.  Low therapeutic target ranges with CVA.   04/24/23 Update:  aPTT = 62 after heparin increased last night to 850 units/hr.  PTT of 62 is below target range (66-85 seconds).  Heparin level is 0.56,likely still falsely elevated from prior apixaban doses. Hemoglobin is down to 10.6, and PLTC is stable at 212 sec. No bleeding reported.   Goal of Therapy:  Heparin level 0.3-0.5 units/ml aPTT 66-85 seconds Monitor platelets by anticoagulation protocol: Yes   Plan:  Increase Heparin drip to 900 units/hr. Check 8 hour aPTT/ HL Daily aPTT and heparin level until correlating; daily CBC. Eliquis on hold for procedure 04/25/23. Will follow up for timing for holding heparin drip pre-op 04/25/23.  Thank you for allowing pharmacy to be part of this patients care team. Noah Delaine, RPh Clinical Pharmacist  04/24/2023,10:31 AM

## 2023-04-24 NOTE — Telephone Encounter (Signed)

## 2023-04-24 NOTE — Progress Notes (Signed)
Speech Language Pathology Daily Session Note  Patient Details  Name: Walter Vargas MRN: [id number] Date of Birth: 1945-12-16  Today's Date: 04/24/2023 SLP Individual Time: 0806-0900 SLP Individual Time Calculation (min): 54 min  Short Term Goals: Week 4: SLP Short Term Goal 1 (Week 4): Patient will utilize external memory aids to recall daily information with 80% accuracy given mod assist. SLP Short Term Goal 1 - Progress (Week 4): Progressing toward goal SLP Short Term Goal 2 (Week 4): Patient will demonstrate intellectual awareness of deficits as evidenced by stating 2 cognitive and 2 physical deficits with max assist. SLP Short Term Goal 2 - Progress (Week 4): Progressing toward goal SLP Short Term Goal 3 (Week 4): Patient will solve basic environmental problems with 90% accuracy given mod assist. SLP Short Term Goal 3 - Progress (Week 4): Progressing toward goal  Skilled Therapeutic Interventions: SLP conducted skilled therapy session targeting dysphagia management and cognitive retraining goals. Re: dysphagia, SLP assessed tolerance of thin liquids and medications. Patient consumed Ensure x2 during session and medications from RN crushed in puree with no overt difficulty nor s/sx of penetration/aspiration. Patient reports that swallowing continues to get easier and he no longer feels "choked" during meals. Cognitively, patient oriented to date, broad situation, and place with supervision-min assist and use of external aids. He recalls significant upcoming medical events independently but requires mod assist for accurate details. Patient requires max assist to recall details from his past and benefits from max assist for environmental problem solving throughout session. Patient was left in lowered bed with call bell in reach and bed alarm set. SLP will continue to target goals per plan of care.        Pain Pain Assessment Pain Scale: 0-10 Pain Score: 0-No pain  Therapy/Group: Individual  Therapy  Jeannie Done, M.A., CCC-SLP  Yetta Barre 04/24/2023, 10:32 AM

## 2023-04-24 NOTE — Progress Notes (Signed)
Occupational Therapy Session Note  Patient Details  Name: Walter Vargas MRN: [id number] Date of Birth: [birth year]  Session 1 Today's Date: 04/24/2023 OT Individual Time: 0925-1000 OT Individual Time Calculation (min): 35 min    Session 2 OT individual time: 1333-1448 OT individual time calculation: 15 min    Short Term Goals: Week 5:  OT Short Term Goal 1 (Week 5): STG= LTG in anticipation of SNF d/c  Skilled Therapeutic Interventions/Progress Updates:    Session 1 Pt received supine with no c/o pain, agreeable to OT session. He came to EOB with (S). He completed sit > stand with CGA, good placement of UE. He completed functional mobility to the sink with the RW with CGA. He stood to complete oral care and was able to appropriately sequence through routine with (S). He was encouraged to complete toileting 2/2 urine odor. He stated "am I wet?" And brief was soaked with urine. He transferred to the toilet with CGA, min cueing for sequencing. He completed toileting tasks with CGA overall. He returned to the w/c following, no void. He changed pants with min A to thread over RLE. He was left sitting up with all needs met, chair alarm set.   Session 2 Pt received sitting up in the w/c, no c/o pain and agreeable to session. He stood from the w/c requiring mod cueing for hand placement, CGA. He completed 200 ft of functional mobility with the RW with CGA, requiring cueing for pacing and RW management. Carryover to cardiovascular endurance and dynamic standing balance. Pt returned to their room following. Pt was left sitting up in the w/c with all needs met, chair alarm set, and call bell within reach.     Therapy Documentation Precautions:  Precautions Precautions: Fall, Sternal, Other (comment) Precaution Booklet Issued: No Precaution/Restrictions Comments: cortrak, sternal precautions Restrictions Weight Bearing Restrictions Per Provider Order: No RUE Weight Bearing Per Provider Order:  Weight bearing as tolerated LUE Weight Bearing Per Provider Order: Weight bearing as tolerated   Therapy/Group: Individual Therapy  [name] 04/24/2023, 10:06 AM

## 2023-04-24 NOTE — Progress Notes (Signed)
  Progress Note    04/24/2023 7:07 AM * No surgery found *  Symptomatic left ICA stenosis Plan is for left Carotid Endarterectomy tomorrow 04/25/23 with Dr. Lenell Antu Reviewed surgery with patient and answered his questions NPO after midnight Consent ordered   Graceann Congress, PA-C [facility] [phone] 04/24/2023 7:07 AM

## 2023-04-25 ENCOUNTER — Encounter (HOSPITAL_COMMUNITY): Payer: Self-pay | Admitting: Vascular Surgery

## 2023-04-25 ENCOUNTER — Inpatient Hospital Stay (HOSPITAL_COMMUNITY): Payer: 59

## 2023-04-25 ENCOUNTER — Encounter (HOSPITAL_COMMUNITY)
Admission: RE | Disposition: A | Discharge status: Skilled Nursing Facility | Payer: Self-pay | Source: Home / Self Care | Attending: Vascular Surgery

## 2023-04-25 ENCOUNTER — Inpatient Hospital Stay (HOSPITAL_COMMUNITY)
Admission: RE | Admit: 2023-04-25 | Discharge: 2023-05-04 | DRG: 038 | Disposition: A | Discharge status: Skilled Nursing Facility | Payer: 59 | Attending: Vascular Surgery | Admitting: Vascular Surgery

## 2023-04-25 ENCOUNTER — Other Ambulatory Visit: Payer: Self-pay

## 2023-04-25 DIAGNOSIS — Y838 Other surgical procedures as the cause of abnormal reaction of the patient, or of later complication, without mention of misadventure at the time of the procedure: Secondary | ICD-10-CM | POA: Diagnosis not present

## 2023-04-25 DIAGNOSIS — D62 Acute posthemorrhagic anemia: Secondary | ICD-10-CM | POA: Diagnosis not present

## 2023-04-25 DIAGNOSIS — M25561 Pain in right knee: Secondary | ICD-10-CM | POA: Diagnosis not present

## 2023-04-25 DIAGNOSIS — I6521 Occlusion and stenosis of right carotid artery: Secondary | ICD-10-CM

## 2023-04-25 DIAGNOSIS — L7632 Postprocedural hematoma of skin and subcutaneous tissue following other procedure: Secondary | ICD-10-CM | POA: Diagnosis not present

## 2023-04-25 DIAGNOSIS — I6522 Occlusion and stenosis of left carotid artery: Secondary | ICD-10-CM | POA: Diagnosis not present

## 2023-04-25 DIAGNOSIS — R531 Weakness: Secondary | ICD-10-CM | POA: Diagnosis not present

## 2023-04-25 DIAGNOSIS — Z811 Family history of alcohol abuse and dependence: Secondary | ICD-10-CM

## 2023-04-25 DIAGNOSIS — S51012A Laceration without foreign body of left elbow, initial encounter: Secondary | ICD-10-CM | POA: Diagnosis not present

## 2023-04-25 DIAGNOSIS — S81812A Laceration without foreign body, left lower leg, initial encounter: Secondary | ICD-10-CM | POA: Diagnosis not present

## 2023-04-25 DIAGNOSIS — I5022 Chronic systolic (congestive) heart failure: Secondary | ICD-10-CM | POA: Diagnosis not present

## 2023-04-25 DIAGNOSIS — K219 Gastro-esophageal reflux disease without esophagitis: Secondary | ICD-10-CM | POA: Diagnosis not present

## 2023-04-25 DIAGNOSIS — W010XXA Fall on same level from slipping, tripping and stumbling without subsequent striking against object, initial encounter: Secondary | ICD-10-CM | POA: Diagnosis not present

## 2023-04-25 DIAGNOSIS — Z833 Family history of diabetes mellitus: Secondary | ICD-10-CM

## 2023-04-25 DIAGNOSIS — I5032 Chronic diastolic (congestive) heart failure: Secondary | ICD-10-CM | POA: Diagnosis not present

## 2023-04-25 DIAGNOSIS — I110 Hypertensive heart disease with heart failure: Secondary | ICD-10-CM | POA: Diagnosis not present

## 2023-04-25 DIAGNOSIS — N179 Acute kidney failure, unspecified: Secondary | ICD-10-CM | POA: Diagnosis not present

## 2023-04-25 DIAGNOSIS — Z8673 Personal history of transient ischemic attack (TIA), and cerebral infarction without residual deficits: Secondary | ICD-10-CM | POA: Diagnosis not present

## 2023-04-25 DIAGNOSIS — I340 Nonrheumatic mitral (valve) insufficiency: Secondary | ICD-10-CM | POA: Diagnosis present

## 2023-04-25 DIAGNOSIS — Z751 Person awaiting admission to adequate facility elsewhere: Secondary | ICD-10-CM

## 2023-04-25 DIAGNOSIS — I252 Old myocardial infarction: Secondary | ICD-10-CM

## 2023-04-25 DIAGNOSIS — I214 Non-ST elevation (NSTEMI) myocardial infarction: Secondary | ICD-10-CM | POA: Diagnosis not present

## 2023-04-25 DIAGNOSIS — M25562 Pain in left knee: Secondary | ICD-10-CM | POA: Diagnosis not present

## 2023-04-25 DIAGNOSIS — I2510 Atherosclerotic heart disease of native coronary artery without angina pectoris: Secondary | ICD-10-CM | POA: Diagnosis not present

## 2023-04-25 DIAGNOSIS — E43 Unspecified severe protein-calorie malnutrition: Secondary | ICD-10-CM | POA: Diagnosis not present

## 2023-04-25 DIAGNOSIS — G8929 Other chronic pain: Secondary | ICD-10-CM | POA: Diagnosis not present

## 2023-04-25 DIAGNOSIS — Z87891 Personal history of nicotine dependence: Secondary | ICD-10-CM

## 2023-04-25 DIAGNOSIS — Z813 Family history of other psychoactive substance abuse and dependence: Secondary | ICD-10-CM | POA: Diagnosis not present

## 2023-04-25 DIAGNOSIS — S1093XA Contusion of unspecified part of neck, initial encounter: Secondary | ICD-10-CM | POA: Diagnosis not present

## 2023-04-25 DIAGNOSIS — I63512 Cerebral infarction due to unspecified occlusion or stenosis of left middle cerebral artery: Secondary | ICD-10-CM | POA: Diagnosis not present

## 2023-04-25 DIAGNOSIS — Z48812 Encounter for surgical aftercare following surgery on the circulatory system: Secondary | ICD-10-CM | POA: Diagnosis not present

## 2023-04-25 DIAGNOSIS — R404 Transient alteration of awareness: Secondary | ICD-10-CM | POA: Diagnosis not present

## 2023-04-25 DIAGNOSIS — Z951 Presence of aortocoronary bypass graft: Secondary | ICD-10-CM

## 2023-04-25 DIAGNOSIS — M6281 Muscle weakness (generalized): Secondary | ICD-10-CM | POA: Diagnosis not present

## 2023-04-25 DIAGNOSIS — Z8249 Family history of ischemic heart disease and other diseases of the circulatory system: Secondary | ICD-10-CM

## 2023-04-25 DIAGNOSIS — Z9889 Other specified postprocedural states: Principal | ICD-10-CM

## 2023-04-25 DIAGNOSIS — Z7401 Bed confinement status: Secondary | ICD-10-CM | POA: Diagnosis not present

## 2023-04-25 DIAGNOSIS — Z743 Need for continuous supervision: Secondary | ICD-10-CM | POA: Diagnosis not present

## 2023-04-25 DIAGNOSIS — R2681 Unsteadiness on feet: Secondary | ICD-10-CM | POA: Diagnosis not present

## 2023-04-25 HISTORY — PX: ENDARTERECTOMY: SHX5162

## 2023-04-25 LAB — CBC
HCT: 33.8 % — ABNORMAL LOW (ref 39.0–52.0)
Hemoglobin: 10.8 g/dL — ABNORMAL LOW (ref 13.0–17.0)
MCH: 28.6 pg (ref 26.0–34.0)
MCHC: 32 g/dL (ref 30.0–36.0)
MCV: 89.7 fL (ref 80.0–100.0)
Platelets: 207 10*3/uL (ref 150–400)
RBC: 3.77 MIL/uL — ABNORMAL LOW (ref 4.22–5.81)
RDW: 18.4 % — ABNORMAL HIGH (ref 11.5–15.5)
WBC: 4.6 10*3/uL (ref 4.0–10.5)
nRBC: 0 % (ref 0.0–0.2)

## 2023-04-25 LAB — APTT: aPTT: 102 s — ABNORMAL HIGH (ref 24–36)

## 2023-04-25 LAB — SURGICAL PCR SCREEN
MRSA, PCR: POSITIVE — AB
Staphylococcus aureus: POSITIVE — AB

## 2023-04-25 LAB — TYPE AND SCREEN
ABO/RH(D): O POS
Antibody Screen: NEGATIVE

## 2023-04-25 LAB — POCT ACTIVATED CLOTTING TIME
Activated Clotting Time: 222 s
Activated Clotting Time: 222 s

## 2023-04-25 LAB — POCT I-STAT, CHEM 8
BUN: 21 mg/dL (ref 8–23)
Calcium, Ion: 1.18 mmol/L (ref 1.15–1.40)
Chloride: 99 mmol/L (ref 98–111)
Creatinine, Ser: 0.7 mg/dL (ref 0.61–1.24)
Glucose, Bld: 81 mg/dL (ref 70–99)
HCT: 37 % — ABNORMAL LOW (ref 39.0–52.0)
Hemoglobin: 12.6 g/dL — ABNORMAL LOW (ref 13.0–17.0)
Potassium: 4.4 mmol/L (ref 3.5–5.1)
Sodium: 136 mmol/L (ref 135–145)
TCO2: 28 mmol/L (ref 22–32)

## 2023-04-25 LAB — HEPARIN LEVEL (UNFRACTIONATED): Heparin Unfractionated: 0.53 [IU]/mL (ref 0.30–0.70)

## 2023-04-25 SURGERY — ENDARTERECTOMY, CAROTID
Anesthesia: General | Laterality: Left

## 2023-04-25 MED ORDER — PANTOPRAZOLE SODIUM 40 MG PO TBEC
40.0000 mg | DELAYED_RELEASE_TABLET | Freq: Two times a day (BID) | ORAL | Status: DC
  Administered 2023-04-25 – 2023-05-03 (×16): 40 mg via ORAL
  Filled 2023-04-25 (×18): qty 1

## 2023-04-25 MED ORDER — GUAIFENESIN-DM 100-10 MG/5ML PO SYRP: 15.0000 mL | ORAL_SOLUTION | ORAL | Status: DC | PRN

## 2023-04-25 MED ORDER — FENTANYL CITRATE (PF) 100 MCG/2ML IJ SOLN: 25.0000 ug | INTRAMUSCULAR | Status: DC | PRN

## 2023-04-25 MED ORDER — SODIUM CHLORIDE 0.9 % IV SOLN: 500.0000 mL | Freq: Once | INTRAVENOUS | Status: DC | PRN

## 2023-04-25 MED ORDER — CEFAZOLIN SODIUM-DEXTROSE 2-4 GM/100ML-% IV SOLN
2.0000 g | Freq: Three times a day (TID) | INTRAVENOUS | Status: AC
  Administered 2023-04-25 – 2023-04-26 (×2): 2 g via INTRAVENOUS
  Filled 2023-04-25 (×2): qty 100

## 2023-04-25 MED ORDER — ROCURONIUM BROMIDE 10 MG/ML (PF) SYRINGE
PREFILLED_SYRINGE | INTRAVENOUS | Status: DC | PRN
  Administered 2023-04-25: 60 mg via INTRAVENOUS
  Administered 2023-04-25: 10 mg via INTRAVENOUS

## 2023-04-25 MED ORDER — OXYCODONE HCL 5 MG PO TABS: 5.0000 mg | ORAL_TABLET | ORAL | Status: DC | PRN

## 2023-04-25 MED ORDER — LACTATED RINGERS IV SOLN: INTRAVENOUS | Status: DC

## 2023-04-25 MED ORDER — ALUM & MAG HYDROXIDE-SIMETH 200-200-20 MG/5ML PO SUSP: 15.0000 mL | ORAL | Status: DC | PRN

## 2023-04-25 MED ORDER — LABETALOL HCL 5 MG/ML IV SOLN: 10.0000 mg | INTRAVENOUS | Status: DC | PRN

## 2023-04-25 MED ORDER — MAGNESIUM SULFATE 2 GM/50ML IV SOLN: 2.0000 g | Freq: Every day | INTRAVENOUS | Status: DC | PRN

## 2023-04-25 MED ORDER — DIGOXIN 125 MCG PO TABS
0.1250 mg | ORAL_TABLET | Freq: Every day | ORAL | Status: DC
  Administered 2023-04-26 – 2023-05-04 (×9): 0.125 mg via ORAL
  Filled 2023-04-25 (×10): qty 1

## 2023-04-25 MED ORDER — ACETAMINOPHEN 650 MG RE SUPP: 325.0000 mg | RECTAL | Status: DC | PRN

## 2023-04-25 MED ORDER — LIDOCAINE 2% (20 MG/ML) 5 ML SYRINGE
INTRAMUSCULAR | Status: DC | PRN
  Administered 2023-04-25: 20 mg via INTRAVENOUS

## 2023-04-25 MED ORDER — CEFAZOLIN SODIUM-DEXTROSE 2-3 GM-%(50ML) IV SOLR
INTRAVENOUS | Status: DC | PRN
  Administered 2023-04-25: 2 g via INTRAVENOUS

## 2023-04-25 MED ORDER — BISACODYL 5 MG PO TBEC: 5.0000 mg | DELAYED_RELEASE_TABLET | Freq: Every day | ORAL | Status: DC | PRN

## 2023-04-25 MED ORDER — HYDRALAZINE HCL 20 MG/ML IJ SOLN: 5.0000 mg | INTRAMUSCULAR | Status: DC | PRN

## 2023-04-25 MED ORDER — CHLORHEXIDINE GLUCONATE 0.12 % MT SOLN
15.0000 mL | Freq: Once | OROMUCOSAL | Status: AC
  Administered 2023-04-25: 15 mL via OROMUCOSAL
  Filled 2023-04-25: qty 15

## 2023-04-25 MED ORDER — METOPROLOL TARTRATE 5 MG/5ML IV SOLN: 2.0000 mg | INTRAVENOUS | Status: DC | PRN

## 2023-04-25 MED ORDER — HEPARIN SODIUM (PORCINE) 1000 UNIT/ML IJ SOLN
INTRAMUSCULAR | Status: AC
  Filled 2023-04-25: qty 2

## 2023-04-25 MED ORDER — ORAL CARE MOUTH RINSE: 15.0000 mL | Freq: Once | OROMUCOSAL | Status: AC

## 2023-04-25 MED ORDER — HEMOSTATIC AGENTS (NO CHARGE) OPTIME
TOPICAL | Status: DC | PRN
Start: 2023-04-25 — End: 2023-04-25
  Administered 2023-04-25: 1 via TOPICAL

## 2023-04-25 MED ORDER — FENTANYL CITRATE (PF) 250 MCG/5ML IJ SOLN
INTRAMUSCULAR | Status: DC | PRN
  Administered 2023-04-25: 25 ug via INTRAVENOUS

## 2023-04-25 MED ORDER — ALBUMIN HUMAN 5 % IV SOLN: 12.5000 g | Freq: Once | INTRAVENOUS | Status: AC

## 2023-04-25 MED ORDER — SODIUM CHLORIDE 0.9% FLUSH
3.0000 mL | Freq: Two times a day (BID) | INTRAVENOUS | Status: DC
  Administered 2023-04-25 – 2023-05-04 (×18): 3 mL via INTRAVENOUS

## 2023-04-25 MED ORDER — SODIUM CHLORIDE 0.9 % IV SOLN: 250.0000 mL | INTRAVENOUS | Status: AC | PRN

## 2023-04-25 MED ORDER — ASPIRIN 81 MG PO CHEW
81.0000 mg | CHEWABLE_TABLET | Freq: Every day | ORAL | Status: DC
  Administered 2023-04-26 – 2023-05-04 (×9): 81 mg via ORAL
  Filled 2023-04-25 (×8): qty 1

## 2023-04-25 MED ORDER — HYDROXYZINE HCL 25 MG PO TABS: 25.0000 mg | ORAL_TABLET | Freq: Three times a day (TID) | ORAL | Status: DC | PRN

## 2023-04-25 MED ORDER — PHENYLEPHRINE HCL (PRESSORS) 10 MG/ML IV SOLN
0.0000 ug/min | INTRAVENOUS | Status: DC
  Administered 2023-04-25: 20 ug/min via INTRAVENOUS

## 2023-04-25 MED ORDER — ACETAMINOPHEN 325 MG PO TABS: 325.0000 mg | ORAL_TABLET | ORAL | Status: DC | PRN

## 2023-04-25 MED ORDER — DOCUSATE SODIUM 50 MG/5ML PO LIQD
100.0000 mg | Freq: Two times a day (BID) | ORAL | Status: DC
  Administered 2023-04-25 – 2023-05-04 (×13): 100 mg via ORAL
  Filled 2023-04-25 (×16): qty 10

## 2023-04-25 MED ORDER — HEPARIN SODIUM (PORCINE) 1000 UNIT/ML IJ SOLN
INTRAMUSCULAR | Status: DC | PRN
Start: 2023-04-25 — End: 2023-04-25
  Administered 2023-04-25: 5000 [IU] via INTRAVENOUS
  Administered 2023-04-25: 2000 [IU] via INTRAVENOUS

## 2023-04-25 MED ORDER — ACETAMINOPHEN 500 MG PO TABS
1000.0000 mg | ORAL_TABLET | Freq: Once | ORAL | Status: DC
  Filled 2023-04-25: qty 2

## 2023-04-25 MED ORDER — ETOMIDATE 2 MG/ML IV SOLN
INTRAVENOUS | Status: AC
  Filled 2023-04-25: qty 10

## 2023-04-25 MED ORDER — CEFAZOLIN SODIUM-DEXTROSE 2-4 GM/100ML-% IV SOLN
INTRAVENOUS | Status: AC
  Filled 2023-04-25: qty 100

## 2023-04-25 MED ORDER — ALBUMIN HUMAN 5 % IV SOLN
INTRAVENOUS | Status: AC
Start: 2023-04-25 — End: 2023-04-25
  Administered 2023-04-25: 12.5 g via INTRAVENOUS
  Filled 2023-04-25: qty 250

## 2023-04-25 MED ORDER — ONDANSETRON HCL 4 MG/2ML IJ SOLN
INTRAMUSCULAR | Status: DC | PRN
  Administered 2023-04-25: 4 mg via INTRAVENOUS

## 2023-04-25 MED ORDER — NOREPINEPHRINE 4 MG/250ML-% IV SOLN
INTRAVENOUS | Status: DC | PRN
  Administered 2023-04-25: 4 ug/min via INTRAVENOUS

## 2023-04-25 MED ORDER — AMIODARONE HCL 200 MG PO TABS
200.0000 mg | ORAL_TABLET | Freq: Every day | ORAL | Status: DC
  Administered 2023-04-26 – 2023-05-04 (×9): 200 mg via ORAL
  Filled 2023-04-25 (×9): qty 1

## 2023-04-25 MED ORDER — ATORVASTATIN CALCIUM 80 MG PO TABS
80.0000 mg | ORAL_TABLET | Freq: Every day | ORAL | Status: DC
  Administered 2023-04-26 – 2023-05-04 (×9): 80 mg via ORAL
  Filled 2023-04-25 (×10): qty 1

## 2023-04-25 MED ORDER — ETOMIDATE 2 MG/ML IV SOLN
INTRAVENOUS | Status: DC | PRN
  Administered 2023-04-25: 10 mg via INTRAVENOUS

## 2023-04-25 MED ORDER — HEPARIN 6000 UNIT IRRIGATION SOLUTION
Status: DC | PRN
  Administered 2023-04-25: 1

## 2023-04-25 MED ORDER — SODIUM CHLORIDE 0.9% FLUSH: 3.0000 mL | INTRAVENOUS | Status: DC | PRN

## 2023-04-25 MED ORDER — SENNOSIDES-DOCUSATE SODIUM 8.6-50 MG PO TABS: 1.0000 | ORAL_TABLET | Freq: Every evening | ORAL | Status: DC | PRN

## 2023-04-25 MED ORDER — ONDANSETRON HCL 4 MG/2ML IJ SOLN: 4.0000 mg | Freq: Four times a day (QID) | INTRAMUSCULAR | Status: DC | PRN

## 2023-04-25 MED ORDER — FENTANYL CITRATE (PF) 250 MCG/5ML IJ SOLN
INTRAMUSCULAR | Status: AC
  Filled 2023-04-25: qty 5

## 2023-04-25 MED ORDER — 0.9 % SODIUM CHLORIDE (POUR BTL) OPTIME
TOPICAL | Status: DC | PRN
  Administered 2023-04-25: 2000 mL

## 2023-04-25 MED ORDER — ACETAMINOPHEN 10 MG/ML IV SOLN
INTRAVENOUS | Status: DC | PRN
  Administered 2023-04-25: 1000 mg via INTRAVENOUS

## 2023-04-25 MED ORDER — MIDAZOLAM HCL 2 MG/2ML IJ SOLN
INTRAMUSCULAR | Status: AC
  Filled 2023-04-25: qty 2

## 2023-04-25 MED ORDER — PHENYLEPHRINE 80 MCG/ML (10ML) SYRINGE FOR IV PUSH (FOR BLOOD PRESSURE SUPPORT)
PREFILLED_SYRINGE | INTRAVENOUS | Status: DC | PRN
  Administered 2023-04-25: 40 ug via INTRAVENOUS
  Administered 2023-04-25: 280 ug via INTRAVENOUS
  Administered 2023-04-25: 80 ug via INTRAVENOUS
  Administered 2023-04-25: 120 ug via INTRAVENOUS
  Administered 2023-04-25 (×2): 80 ug via INTRAVENOUS
  Administered 2023-04-25: 120 ug via INTRAVENOUS
  Administered 2023-04-25: 80 ug via INTRAVENOUS
  Administered 2023-04-25 (×2): 40 ug via INTRAVENOUS

## 2023-04-25 MED ORDER — ACETAMINOPHEN 10 MG/ML IV SOLN
INTRAVENOUS | Status: AC
  Filled 2023-04-25: qty 100

## 2023-04-25 MED ORDER — LIDOCAINE 2% (20 MG/ML) 5 ML SYRINGE: INTRAMUSCULAR | Status: DC | PRN

## 2023-04-25 MED ORDER — DEXAMETHASONE SODIUM PHOSPHATE 10 MG/ML IJ SOLN
INTRAMUSCULAR | Status: DC | PRN
  Administered 2023-04-25: 5 mg via INTRAVENOUS

## 2023-04-25 MED ORDER — PHENOL 1.4 % MT LIQD: 1.0000 | OROMUCOSAL | Status: DC | PRN

## 2023-04-25 MED ORDER — SUGAMMADEX SODIUM 200 MG/2ML IV SOLN
INTRAVENOUS | Status: DC | PRN
  Administered 2023-04-25: 200 mg via INTRAVENOUS

## 2023-04-25 MED ORDER — HYDROMORPHONE HCL 1 MG/ML IJ SOLN: 0.5000 mg | INTRAMUSCULAR | Status: DC | PRN

## 2023-04-25 MED ORDER — LIDOCAINE HCL (PF) 1 % IJ SOLN
INTRAMUSCULAR | Status: AC
  Filled 2023-04-25: qty 30

## 2023-04-25 SURGICAL SUPPLY — 48 items
BENZOIN TINCTURE PRP APPL 2/3 (GAUZE/BANDAGES/DRESSINGS) ×1 IMPLANT
BLADE MINI RND TIP GREEN BEAV (BLADE) ×1 IMPLANT
CANISTER SUCT 3000ML PPV (MISCELLANEOUS) ×1 IMPLANT
CANNULA VESSEL 3MM 2 BLNT TIP (CANNULA) ×2 IMPLANT
CATH ROBINSON RED A/P 18FR (CATHETERS) ×1 IMPLANT
CHLORAPREP W/TINT 26 (MISCELLANEOUS) ×1 IMPLANT
CLIP LIGATING EXTRA MED SLVR (CLIP) ×1 IMPLANT
CLIP LIGATING EXTRA SM BLUE (MISCELLANEOUS) ×1 IMPLANT
COVER PROBE W GEL 5X96 (DRAPES) ×1 IMPLANT
COVER SURGIBOOT TRANSDUCER 6 (DISPOSABLE) IMPLANT
DERMABOND ADVANCED .7 DNX12 (GAUZE/BANDAGES/DRESSINGS) IMPLANT
DRAIN CHANNEL 15F RND FF W/TCR (WOUND CARE) IMPLANT
DRAPE INCISE IOBAN 66X45 STRL (DRAPES) IMPLANT
DRAPE SURG ORHT 6 SPLT 77X108 (DRAPES) IMPLANT
DRSG COVADERM 4X8 (GAUZE/BANDAGES/DRESSINGS) ×1 IMPLANT
ELECT REM PT RETURN 9FT ADLT (ELECTROSURGICAL) ×1 IMPLANT
ELECTRODE REM PT RTRN 9FT ADLT (ELECTROSURGICAL) ×1 IMPLANT
EVACUATOR SILICONE 100CC (DRAIN) IMPLANT
GAUZE 4X4 16PLY ~~LOC~~+RFID DBL (SPONGE) IMPLANT
GLOVE BIO SURGEON STRL SZ7 (GLOVE) IMPLANT
GLOVE BIO SURGEON STRL SZ8 (GLOVE) ×1 IMPLANT
GLOVE BIOGEL PI IND STRL 6.5 (GLOVE) IMPLANT
GLOVE BIOGEL PI IND STRL 7.0 (GLOVE) IMPLANT
GLOVE SS BIOGEL STRL SZ 6.5 (GLOVE) IMPLANT
GOWN STRL REUS W/ TWL LRG LVL3 (GOWN DISPOSABLE) ×2 IMPLANT
GOWN STRL REUS W/ TWL XL LVL3 (GOWN DISPOSABLE) ×1 IMPLANT
HEMOSTAT SNOW SURGICEL 2X4 (HEMOSTASIS) IMPLANT
KIT BASIN OR (CUSTOM PROCEDURE TRAY) ×1 IMPLANT
KIT SHUNT ARGYLE CAROTID ART 6 (VASCULAR PRODUCTS) IMPLANT
KIT TURNOVER KIT B (KITS) ×1 IMPLANT
NDL HYPO 25GX1X1/2 BEV (NEEDLE) IMPLANT
NEEDLE HYPO 25GX1X1/2 BEV (NEEDLE) IMPLANT
NS IRRIG 1000ML POUR BTL (IV SOLUTION) ×3 IMPLANT
PACK CAROTID (CUSTOM PROCEDURE TRAY) ×1 IMPLANT
PAD ARMBOARD 7.5X6 YLW CONV (MISCELLANEOUS) ×2 IMPLANT
PATCH VASC XENOSURE 1X6 (Vascular Products) IMPLANT
POSITIONER HEAD DONUT 9IN (MISCELLANEOUS) ×1 IMPLANT
STRIP CLOSURE SKIN 1/2X4 (GAUZE/BANDAGES/DRESSINGS) ×1 IMPLANT
SUT MNCRL AB 4-0 PS2 18 (SUTURE) ×1 IMPLANT
SUT PROLENE 6 0 BV (SUTURE) ×2 IMPLANT
SUT PROLENE 7 0 BV1 MDA (SUTURE) IMPLANT
SUT SILK 3-0 18XBRD TIE 12 (SUTURE) IMPLANT
SUT VIC AB 2-0 CT1 TAPERPNT 27 (SUTURE) ×1 IMPLANT
SUT VIC AB 3-0 SH 27X BRD (SUTURE) ×1 IMPLANT
SYR CONTROL 10ML LL (SYRINGE) IMPLANT
TAPE UMBILICAL 1/8X30 (MISCELLANEOUS) IMPLANT
TOWEL GREEN STERILE (TOWEL DISPOSABLE) ×1 IMPLANT
WATER STERILE IRR 1000ML POUR (IV SOLUTION) ×1 IMPLANT

## 2023-04-25 NOTE — H&P (Addendum)
 [facility] [place]  ASSESSMENT / PLAN: 78 y.o. male with symptomatic left carotid artery stenosis. Plan left carotid endarterectomy today in OR. Admit to 4E postop. Discussed R/B/A with patient and son at length. Specifically quoted risk of stroke (1-2%), cranial nerve injury (5%), hematoma (<5%). Start heparin 8 hours post op as long as neck soft.  CHIEF COMPLAINT: stroke in rehab  HISTORY OF PRESENT ILLNESS: Walter Vargas is a 78 y.o. male with past medical history significant for hypertension, former smoking, CAD, CHF.  He suffered NSTEMI requiring Impella.  He underwent CABG and was discharged to inpatient rehabilitation postoperatively.  He has been experiencing increased agitation and confusion on top of cognitive decline.  Workup included MRI brain demonstrating left-sided acute infarcts.  Preoperatively carotid duplex demonstrated 80 to 99% stenosis of the left ICA.  He is now on dual antiplatelet therapy.  On exam he is unaware of stroke diagnosis.  He denies any one-sided weakness.  He is alert but somewhat confused. Workup was completed and identified severe left cervical carotid artery stenosis.  Past Medical History:  Diagnosis Date   Essential hypertension    HFrEF (heart failure with reduced ejection fraction) (HCC)    Mitral regurgitation     Past Surgical History:  Procedure Laterality Date   CORONARY ARTERY BYPASS GRAFT N/A 03/01/2023   Procedure: CORONARY ARTERY BYPASS GRAFTING X 5, USING LEFT INTERNAL MAMMARY ARTERY AND ENDOSCOPICALLY HARVESTED RIGHT SAPHENOUS VEIN GRAFT;  Surgeon: [name], MD;  Location: [facility] OR;  Service: Open Heart Surgery;  Laterality: N/A;   EXPLORATION POST OPERATIVE OPEN HEART N/A 03/01/2023   Procedure: EXPLORATION POST OPERATIVE OPEN HEART;  Surgeon: [name], MD;  Location: [facility] OR;  Service: Open Heart Surgery;  Laterality: N/A;   NO PAST SURGERIES     PLACEMENT OF IMPELLA LEFT VENTRICULAR  ASSIST DEVICE  03/01/2023   Procedure: PLACEMENT OF IMPELLA LEFT VENTRICULAR ASSIST DEVICE;  Surgeon: [name], MD;  Location: [facility] OR;  Service: Open Heart Surgery;;   REMOVAL OF IMPELLA LEFT VENTRICULAR ASSIST DEVICE N/A 03/12/2023   Procedure: REMOVAL OF IMPELLA LEFT VENTRICULAR ASSIST DEVICE;  Surgeon: [name], MD;  Location: [facility] OR;  Service: Open Heart Surgery;  Laterality: N/A;   RIGHT/LEFT HEART CATH AND CORONARY ANGIOGRAPHY N/A 02/13/2023   Procedure: RIGHT/LEFT HEART CATH AND CORONARY ANGIOGRAPHY;  Surgeon: Yvonne Kendall, MD;  Location: [facility] INVASIVE CV LAB;  Service: Cardiovascular;  Laterality: N/A;   TEE WITHOUT CARDIOVERSION N/A 03/01/2023   Procedure: TRANSESOPHAGEAL ECHOCARDIOGRAM (TEE);  Surgeon: [name], MD;  Location: [facility] OR;  Service: Open Heart Surgery;  Laterality: N/A;   TEE WITHOUT CARDIOVERSION N/A 03/12/2023   Procedure: TRANSESOPHAGEAL ECHOCARDIOGRAM (TEE);  Surgeon: [name], MD;  Location: [facility] OR;  Service: Open Heart Surgery;  Laterality: N/A;    Family History  Problem Relation Age of Onset   Diabetes Mother        gestational diabetes   Hypertension Father    Alcohol abuse Father    Heart disease Brother    Alcohol abuse Brother    Drug abuse Brother     Social History   Socioeconomic History   Marital status: Single    Spouse name: Not on file   Number of children: Not on file   Years of education: Not on file   Highest education level: Not on file  Occupational History   Not on file  Tobacco Use   Smoking status:  Former    Current packs/day: 0.00    Average packs/day: 0.5 packs/day for 10.0 years (5.0 ttl pk-yrs)    Types: Cigarettes    Start date: 71    Quit date: 1978    Years since quitting: 47.1   Smokeless tobacco: Never  Vaping Use   Vaping status: Never Used  Substance and Sexual Activity   Alcohol use: Not Currently    Comment: no alcohol since 78 years old   Drug use: Never    Sexual activity: Not Currently  Other Topics Concern   Not on file  Social History Narrative   Not on file   Social Drivers of Health   Financial Resource Strain: Not on file  Food Insecurity: No Food Insecurity (02/14/2023)   Hunger Vital Sign    Worried About Running Out of Food in the Last Year: Never true    Ran Out of Food in the Last Year: Never true  Transportation Needs: No Transportation Needs (02/14/2023)   PRAPARE - Administrator, Civil Service (Medical): No    Lack of Transportation (Non-Medical): No  Physical Activity: Not on file  Stress: Not on file  Social Connections: Patient Unable To Answer (03/12/2023)   Social Connection and Isolation Panel [NHANES]    Frequency of Communication with Friends and Family: Patient unable to answer    Frequency of Social Gatherings with Friends and Family: Patient unable to answer    Attends Religious Services: Patient unable to answer    Active Member of Clubs or Organizations: Patient unable to answer    Attends Banker Meetings: Patient unable to answer    Marital Status: Patient unable to answer  Intimate Partner Violence: Not At Risk (02/14/2023)   Humiliation, Afraid, Rape, and Kick questionnaire    Fear of Current or Ex-Partner: No    Emotionally Abused: No    Physically Abused: No    Sexually Abused: No    No Active Allergies  Current Facility-Administered Medications  Medication Dose Route Frequency Provider Last Rate Last Admin   chlorhexidine (PERIDEX) 0.12 % solution 15 mL  15 mL Mouth/Throat Once Gaynelle Adu, MD       Or   Oral care mouth rinse  15 mL Mouth Rinse Once Gaynelle Adu, MD       lactated ringers infusion   Intravenous Continuous Gaynelle Adu, MD        PHYSICAL EXAM Vitals:   04/25/23 0857  BP: (!) 118/53  Pulse: (!) 56  Resp: 17  Temp: 98.1 F (36.7 C)  TempSrc: Oral  SpO2: 100%  Weight: 51 kg  Height: 5\' 8"  (1.727 m)   Elderly thin man  in no distress Regular rate and rhythm Unlabored breathing No CN deficits Symmetric upper extremity grip Moving lower extremities to command  PERTINENT LABORATORY AND RADIOLOGIC DATA  Most recent CBC    Latest Ref Rng & Units 04/25/2023    5:25 AM 04/24/2023    5:14 AM 04/23/2023    6:01 AM  CBC  WBC 4.0 - 10.5 K/uL 4.6  4.8  5.3   Hemoglobin 13.0 - 17.0 g/dL 09.8  11.9  14.7   Hematocrit 39.0 - 52.0 % 33.8  33.5  34.8   Platelets 150 - 400 K/uL 207  212  218      Most recent CMP    Latest Ref Rng & Units 04/23/2023    6:01 AM 04/17/2023   12:57 PM 04/16/2023    5:36  AM  CMP  Glucose 70 - 99 mg/dL 88  94  76   BUN 8 - 23 mg/dL 19  22  12    Creatinine 0.61 - 1.24 mg/dL 1.61  0.96  0.45   Sodium 135 - 145 mmol/L 138  133  133   Potassium 3.5 - 5.1 mmol/L 4.2  4.7  4.2   Chloride 98 - 111 mmol/L 98  98  103   CO2 22 - 32 mmol/L 25  25  23    Calcium 8.9 - 10.3 mg/dL 9.4  9.1  8.7   Total Protein 6.5 - 8.1 g/dL  6.9    Total Bilirubin 0.0 - 1.2 mg/dL  0.8    Alkaline Phos 38 - 126 U/L  104    AST 15 - 41 U/L  34    ALT 0 - 44 U/L  38      Renal function Estimated Creatinine Clearance: 55.8 mL/min (by C-G formula based on SCr of 0.68 mg/dL).  Hgb A1c MFr Bld (%)  Date Value  02/28/2023 5.9 (H)    LDL Cholesterol  Date Value Ref Range Status  02/11/2023 43 0 - 99 mg/dL Final    Comment:           Total Cholesterol/HDL:CHD Risk Coronary Heart Disease Risk Table                     Men   Women  1/2 Average Risk   3.4   3.3  Average Risk       5.0   4.4  2 X Average Risk   9.6   7.1  3 X Average Risk  23.4   11.0        Use the calculated Patient Ratio above and the CHD Risk Table to determine the patient's CHD Risk.        ATP III CLASSIFICATION (LDL):  <100     mg/dL   Optimal  409-811  mg/dL   Near or Above                    Optimal  130-159  mg/dL   Borderline  914-782  mg/dL   High  >956     mg/dL   Very High Performed at [facility], [address] [place], [place] [address]     CT angiogram of head / neck shows severe left carotid artery stenosis; carotid siphon stenosis  MRI brain shows multifocal left sided stroke  Rande Brunt. Lenell Antu, MD FACS [facility] Phone Number: [phone] 04/25/2023 9:07 AM   Total time spent on preparing this encounter including chart review, data review, collecting history, examining the patient, coordinating care for this established patient, 30 minutes.  Portions of this report may have been transcribed using voice recognition software.  Every effort has been made to ensure accuracy; however, inadvertent computerized transcription errors may still be present.

## 2023-04-25 NOTE — Anesthesia Procedure Notes (Signed)
Arterial Line Insertion Start/End2/02/2024 9:45 AM, 04/25/2023 9:51 AM Performed by: Gaynelle Adu, MD, Darlina Guys, CRNA, CRNA  Lidocaine 1% used for infiltration Right, radial was placed Catheter size: 20 G Hand hygiene performed , maximum sterile barriers used  and Seldinger technique used  Attempts: 1 Procedure performed using ultrasound guided technique. Ultrasound Notes:anatomy identified and no ultrasound evidence of intravascular and/or intraneural injection Following insertion, Biopatch. Post procedure assessment: normal  Patient tolerated the procedure well with no immediate complications.

## 2023-04-25 NOTE — Anesthesia Procedure Notes (Signed)
Procedure Name: Intubation Date/Time: 04/25/2023 10:13 AM  Performed by: Darlina Guys, CRNAPre-anesthesia Checklist: Patient identified, Emergency Drugs available, Suction available and Patient being monitored Patient Re-evaluated:Patient Re-evaluated prior to induction Oxygen Delivery Method: Circle System Utilized Preoxygenation: Pre-oxygenation with 100% oxygen Induction Type: IV induction Ventilation: Mask ventilation without difficulty Laryngoscope Size: Mac and 4 Grade View: Grade II Tube type: Oral Tube size: 7.5 mm Number of attempts: 1 Airway Equipment and Method: Stylet and Oral airway Placement Confirmation: ETT inserted through vocal cords under direct vision, positive ETCO2 and breath sounds checked- equal and bilateral Secured at: 23 cm Tube secured with: Tape Dental Injury: Teeth and Oropharynx as per pre-operative assessment  Comments: Atraumatic induction/intubation. Dentition and oral mucosa as per preop.

## 2023-04-25 NOTE — Progress Notes (Addendum)
  Progress Note    04/25/2023 3:31 PM * Day of Surgery *  Subjective:  says he feels fine. Feels a little sore at his incision site. No slurred speech, no facial droop, no weakness or numbness    Vitals:   04/25/23 1507 04/25/23 1528  BP: (!) 121/56 (!) 114/48  Pulse: (!) 57 (!) 58  Resp: 14 13  Temp: 97.9 F (36.6 C)   SpO2: 100% 99%    Physical Exam: General:  resting comfortably in bed Cardiac:  regular Lungs:  nonlabored Incisions:  left sided neck incision c/d/l without hematoma. Mild bruising but soft Extremities:  moving all extremities equally Neuro: intact. No slurred speech, tongue deviation, lip droop, or weakness/numbness  CBC    Component Value Date/Time   WBC 4.6 04/25/2023 0525   RBC 3.77 (L) 04/25/2023 0525   HGB 12.6 (L) 04/25/2023 0934   HCT 37.0 (L) 04/25/2023 0934   PLT 207 04/25/2023 0525   MCV 89.7 04/25/2023 0525   MCH 28.6 04/25/2023 0525   MCHC 32.0 04/25/2023 0525   RDW 18.4 (H) 04/25/2023 0525   LYMPHSABS 0.8 03/28/2023 0536   MONOABS 0.8 03/28/2023 0536   EOSABS 0.2 03/28/2023 0536   BASOSABS 0.1 03/28/2023 0536    BMET    Component Value Date/Time   NA 136 04/25/2023 0934   K 4.4 04/25/2023 0934   CL 99 04/25/2023 0934   CO2 25 04/23/2023 0601   GLUCOSE 81 04/25/2023 0934   BUN 21 04/25/2023 0934   CREATININE 0.70 04/25/2023 0934   CALCIUM 9.4 04/23/2023 0601   GFRNONAA >60 04/23/2023 0601    INR    Component Value Date/Time   INR 1.6 (H) 03/03/2023 0314     Intake/Output Summary (Last 24 hours) at 04/25/2023 1531 Last data filed at 04/25/2023 1445 Gross per 24 hour  Intake 950 ml  Output 195 ml  Net 755 ml      Assessment/Plan:  78 y.o. male underwent left carotid endarterectomy today    -He is doing well up on 4E. Minimal pain at incision site -Hemodynamically stable. SBPs in 110s-120s -Neuro intact without weakness, numbness, slurred speech, or facial droop -Left sided neck incision is c/d/l without  hematoma -Will restart heparin gtt tomorrow morning as long as there are no bleeding issues -Likely eventual d/c to SNF when medically stable   Loel Dubonnet, PA-C [facility] [phone] 04/25/2023 3:31 PM

## 2023-04-25 NOTE — Anesthesia Preprocedure Evaluation (Addendum)
Anesthesia Evaluation  Patient identified by MRN, date of birth, ID band Patient awake    Reviewed: Allergy & Precautions, H&P , NPO status , Patient's Chart, lab work & pertinent test results  Airway Mallampati: III  TM Distance: >3 FB Neck ROM: Full    Dental no notable dental hx. (+) Teeth Intact, Dental Advisory Given   Pulmonary former smoker   Pulmonary exam normal breath sounds clear to auscultation       Cardiovascular hypertension, Pt. on medications + CAD, + Past MI, + CABG, + Peripheral Vascular Disease and +CHF   Rhythm:Regular Rate:Normal     Neuro/Psych       Dementia CVA    GI/Hepatic negative GI ROS, Neg liver ROS,,,  Endo/Other  negative endocrine ROS    Renal/GU negative Renal ROS  negative genitourinary   Musculoskeletal   Abdominal   Peds  Hematology negative hematology ROS (+)   Anesthesia Other Findings   Reproductive/Obstetrics negative OB ROS                             Anesthesia Physical Anesthesia Plan  ASA: 4  Anesthesia Plan: General   Post-op Pain Management: Tylenol PO (pre-op)*   Induction: Intravenous  PONV Risk Score and Plan: 3 and Ondansetron, Dexamethasone and Treatment may vary due to age or medical condition  Airway Management Planned: Oral ETT  Additional Equipment: Arterial line  Intra-op Plan:   Post-operative Plan: Extubation in OR and Possible Post-op intubation/ventilation  Informed Consent: I have reviewed the patients History and Physical, chart, labs and discussed the procedure including the risks, benefits and alternatives for the proposed anesthesia with the patient or authorized representative who has indicated his/her understanding and acceptance.     Dental advisory given  Plan Discussed with: CRNA  Anesthesia Plan Comments:        Anesthesia Quick Evaluation

## 2023-04-25 NOTE — Discharge Instructions (Addendum)
 [facility]  Discharge Instructions   Carotid Surgery  Please refer to the following instructions for your post-procedure care. Your surgeon or physician assistant will discuss any changes with you.  Activity  You are encouraged to walk as much as you can. You can slowly return to normal activities but must avoid strenuous activity and heavy lifting until your doctor tell you it's okay. Avoid activities such as vacuuming or swinging a golf club. You can drive after one week if you are comfortable and you are no longer taking prescription pain medications. It is normal to feel tired for serval weeks after your surgery. It is also normal to have difficulty with sleep habits, eating, and bowel movements after surgery. These will go away with time.  Bathing/Showering  Shower daily after you go home. Do not soak in a bathtub, hot tub, or swim until the incision heals completely.  Incision Care  Shower every day. Clean your incision with mild soap and water. Pat the area dry with a clean towel. You do not need a bandage unless otherwise instructed. Do not apply any ointments or creams to your incision. You may have skin glue on your incision. Do not peel it off. It will come off on its own in about one week. Your incision may feel thickened and raised for several weeks after your surgery. This is normal and the skin will soften over time.   For Men Only: It's okay to shave around the incision but do not shave the incision itself for 2 weeks. It is common to have numbness under your chin that could last for several months.  Diet  Resume your normal diet. There are no special food restrictions following this procedure. A low fat/low cholesterol diet is recommended for all patients with vascular disease. In order to heal from your surgery, it is CRITICAL to get adequate nutrition. Your body requires vitamins, minerals, and protein. Vegetables are the best source of  vitamins and minerals. Vegetables also provide the perfect balance of protein. Processed food has little nutritional value, so try to avoid this.  Medications  Resume taking all of your medications unless your doctor or physician assistant tells you not to. If your incision is causing pain, you may take over-the- counter pain relievers such as acetaminophen (Tylenol). If you were prescribed a stronger pain medication, please be aware these medications can cause nausea and constipation. Prevent nausea by taking the medication with a snack or meal. Avoid constipation by drinking plenty of fluids and eating foods with a high amount of fiber, such as fruits, vegetables, and grains.  Do not take Tylenol if you are taking prescription pain medications.  Follow Up  Our office will schedule a follow up appointment 2-3 weeks following discharge.  Please call us immediately for any of the following conditions  Increased pain, redness, drainage (pus) from your incision site. Fever of 101 degrees or higher. If you should develop stroke (slurred speech, difficulty swallowing, weakness on one side of your body, loss of vision) you should call 911 and go to the nearest emergency room.  Reduce your risk of vascular disease:  Stop smoking. If you would like help call [facility] at 1-800-QUIT-NOW (860 721 3643) or [place] at [phone]. Manage your cholesterol Maintain a desired weight Control your diabetes Keep your blood pressure down  If you have any questions, please call the office at [phone].  =================================================================================================================================================================== Information on my medicine - ELIQUIS (apixaban)  This medication education  was reviewed with me or my healthcare representative as part of my discharge preparation.   Why was Eliquis prescribed for you? Eliquis was prescribed for  you to reduce the risk of forming blood clots that can cause a stroke if you have a medical condition called atrial fibrillation (a type of irregular heartbeat) OR to reduce the risk of a blood clots forming after orthopedic surgery.  What do You need to know about Eliquis ? Take your Eliquis TWICE DAILY - one tablet in the morning and one tablet in the evening with or without food.  It would be best to take the doses about the same time each day.  If you have difficulty swallowing the tablet whole please discuss with your pharmacist how to take the medication safely.  Take Eliquis exactly as prescribed by your doctor and DO NOT stop taking Eliquis without talking to the doctor who prescribed the medication.  Stopping may increase your risk of developing a new clot or stroke.  Refill your prescription before you run out.  After discharge, you should have regular check-up appointments with your healthcare provider that is prescribing your Eliquis.  In the future your dose may need to be changed if your kidney function or weight changes by a significant amount or as you get older.  What do you do if you miss a dose? If you miss a dose, take it as soon as you remember on the same day and resume taking twice daily.  Do not take more than one dose of ELIQUIS at the same time.  Important Safety Information A possible side effect of Eliquis is bleeding. You should call your healthcare provider right away if you experience any of the following: Bleeding from an injury or your nose that does not stop. Unusual colored urine (red or dark brown) or unusual colored stools (red or black). Unusual bruising for unknown reasons. A serious fall or if you hit your head (even if there is no bleeding).  Some medicines may interact with Eliquis and might increase your risk of bleeding or clotting while on Eliquis. To help avoid this, consult your healthcare provider or pharmacist prior to using any new  prescription or non-prescription medications, including herbals, vitamins, non-steroidal anti-inflammatory drugs (NSAIDs) and supplements.  This website has more information on Eliquis (apixaban): http://[email or web address].com/eliquis/home

## 2023-04-25 NOTE — Progress Notes (Addendum)
Inpatient Rehabilitation Discharge Medication Review by a Pharmacist  A complete drug regimen review was completed for this patient to identify any potential clinically significant medication issues.   High Risk Drug Classes Is patient taking? Indication by Medication  Antipsychotic No   Anticoagulant Yes IV Heparin infusion- CVA, cardiomyopathy  Antibiotic No   Opioid No   Antiplatelet Yes Aspirin 81 mg - CAD  Hypoglycemics/insulin No   Vasoactive Medication Yes Amiodarone - VT post-CABG Digoxin- CHF  Chemotherapy No    Other Yes Atorvastatin -  HLD Pantoprazole - GERD Hydroxyzine- anxiety Multivitamin, Vitamin C, supplement Colace- stool softener          Type of Medication Issue Identified Description of Issue Recommendation(s)  Drug Interaction(s) (clinically significant)        Duplicate Therapy        Allergy        No Medication Administration End Date       Incorrect Dose        Additional Drug Therapy Needed        Significant med changes from prior encounter (inform family/care partners about these prior to discharge). Losartan, Spironolactone, Dapagliflozin were discontinued  Communicate changes with patient/family prior to discharge.  Other            Clinically significant medication issues were identified that warrant physician communication and completion of prescribed/recommended actions by midnight of the next day:  No   Name of provider notified for urgent issues identified:    Provider Method of Notification: pending.  Pharmacist comments:    Time spent performing this drug regimen review (minutes):  30   Walter Vargas, [place] Clinical Pharmacist 04/25/2023 12:26 PM

## 2023-04-25 NOTE — Progress Notes (Addendum)
 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 Dorris Fetch on 03/01/2023. He underwent mediastinal re exploration for bleeding and  removal of Impella 03/12/2023. He had a complicated post op course. For specifics, please see his medical record, but in summary, he had a Cortrak, he had delirium post op and was treated with Seroquel.  He was treated by wound care for a deep tissue pressure injury to his sacrum. He was eventually stable for transfer to CIR on 03/20/2023. He had increasing agitation, confusion, and ongoing cognitive decline. Work up revealed he had a CVA while at CIR (acute ischemic left middle cerebral artery (MCA) stroke. Finally, during his work up for CABG, he was found to have an asymptomatic left ICA stenosis 80 to 99%. Dr. Lenell Antu,  vascular surgeon evaluated patient. After discussion with Dr. Roda Shutters (neurologist), it was ultimately decided to have patient undergo a left CEA on this admission.  He had a left carotid endarterectomy with bovine pericardial patch angioplasty by Dr. Lenell Antu on 04/25/2023. He was discharged to SNF on 05/04/2023. He presents today for routine post op follow up. His only complaint is some of the pills he takes have to be crushed and are still hard for him to swallow. He denies chest pain, shortness of breath, fever, or chills.  Outpatient Encounter Medications as of 05/09/2023  Medication Sig   amiodarone (PACERONE) 200 MG tablet Take 1 tablet (200 mg total) by mouth daily.   apixaban (ELIQUIS) 5 MG TABS tablet Take 1 tablet (5 mg total) by mouth 2  (two) times daily.   ascorbic acid (VITAMIN C) 500 MG tablet Take 1 tablet (500 mg total) by mouth daily.   aspirin 81 MG chewable tablet Chew 1 tablet (81 mg total) by mouth daily.   atorvastatin (LIPITOR) 80 MG tablet Take 1 tablet (80 mg total) by mouth daily.   digoxin (LANOXIN) 0.125 MG tablet Take 1 tablet (0.125 mg total) by mouth daily.   docusate (COLACE) 50 MG/5ML liquid Take 10 mLs (100 mg total) by mouth 2 (two) times daily.   hydrOXYzine (ATARAX) 25 MG tablet Take 1 tablet (25 mg total) by mouth 3 (three) times daily as needed for anxiety.   Multiple Vitamins-Minerals (MULTIVITAMIN MEN 50+) TABS Take 1 tablet by mouth daily.   Vital Signs: Vitals:   05/09/23 1319  BP: (!) 120/56  Pulse: 60  Resp: 18  SpO2: 97%      Physical Exam: CV-RRR, no murmur Neck-eschar on left CEA wound, hematoma Pulmonary-Clear to auscultation bilaterally Abdomen-Soft, umbilical hernia, non tender, bowel sounds present Extremities-No LE edema. Skin dry Wounds-Clean, dry, well healed  Diagnostic Test: Narrative & Impression  CLINICAL DATA:  Status post CABG   EXAM: CHEST - 2 VIEW   COMPARISON:  X-ray 03/29/2023   FINDINGS: Status post median sternotomy. Normal cardiopericardial silhouette. No pneumothorax or edema. Small pleural effusions. Increasing adjacent right lung base opacity. Degenerative changes along the spine. Old left-sided rib fracture.   IMPRESSION: Postop chest. Persistent small pleural effusions.  Increasing opacity right lung base. Acute infiltrates possible. Recommend follow-up.     Electronically Signed   By: Karen Kays M.D.   On: 05/09/2023 14:39    Impression and Plan: I reviewed today's chest x ray. We discussed discontinuance of sternal precautions on 05/11/2023. He is still at a SNF Regarding medications, he is no longer on a diuretic . He will need to follow up with advanced heart failure for further medication titration (? stop Amiodarone) and repeat  echo to determine LVEF.  He will also see Vascular surgery on 05/16/2023. Our office included phone number of [facility] so that facility will call for a follow up and I emailed advanced heart failure to try and obtain him an appointment as well.   Ardelle Balls, PA-C [facility] [phone]

## 2023-04-25 NOTE — Op Note (Signed)
DATE OF SERVICE: 04/25/2023  PATIENT:  Walter Vargas  78 y.o. male  PRE-OPERATIVE DIAGNOSIS:  symptomatic left carotid artery stenosis  POST-OPERATIVE DIAGNOSIS:  Same  PROCEDURE:   Left carotid endarterectomy with bovine pericardial patch angioplasty  SURGEON:  Surgeons and Role:    * Leonie Douglas, MD - Primary  ASSISTANT: Mikki Harbor, MD  An experienced assistant was required given the complexity of this procedure and the standard of surgical care. My assistant helped with exposure through counter tension, suctioning, ligation and retraction to better visualize the surgical field.  My assistant expedited sewing during the case by following my sutures. Wherever I use the term "we" in the report, my assistant actively helped me with that portion of the procedure.  ANESTHESIA:   general  EBL: 50mL  BLOOD ADMINISTERED:none  DRAINS: none   LOCAL MEDICATIONS USED:  NONE  SPECIMEN:  none  COUNTS: confirmed correct.  TOURNIQUET:  none  PATIENT DISPOSITION:  PACU - hemodynamically stable.   Delay start of Pharmacological VTE agent (>24hrs) due to surgical blood loss or risk of bleeding: no  INDICATION FOR PROCEDURE: LONDEN BOK is a 78 y.o. male with symptomatic left carotid artery stenosis. After careful discussion of risks, benefits, and alternatives the patient was offered left carotid endarterectomy. We specifically discussed risk of stroke, cranial nerve injury, and hematoma. The patient understood and wished to proceed.  OPERATIVE FINDINGS:  Severe stenosis with vulnerable plaque about bifurcation. Good technical result from endarterectomy. Brisk, normal doppler flow in all carotid branches. No technical problems identified on completion duplex. Patient awoke at neurologic baseline. Exam re-confirmed in PACU.  DESCRIPTION OF PROCEDURE: After identification of the patient in the pre-operative holding area, the patient was transferred to the operating room. The patient  was positioned supine on the operating room table. Anesthesia was induced. The left neck was prepped and draped in standard fashion. A surgical pause was performed confirming correct patient, procedure, and operative location.  A longitudinal incision was made over the anterior aspect of the sternocleidomastoid. The incision was carried down through the platysma using bovie electrocautery. The platysma was elevated. The external jugular vein was found coursing across the exposure, ligated, and divided. We developed an avascular plane over the sternocleidomastoid and rotated this laterally and posteriorly.  The carotid sheath was entered carefully with Metzenbaum scissors.  The vagus nerve was identified and preserved.  The hypoglossal nerve was identified and preserved.  Common carotid artery was skeletonized for several centimeters.  The bifurcation was skeletonized.  The internal carotid artery was skeletonized for several centimeters.  Patient was systemically heparinized.  Activated clotting time measurements were used out the case to confirm adequate anticoagulation.  Clamps were applied to the internal carotid artery, followed by the common carotid artery, followed by the external carotid artery.  An anterior arteriotomy was made with 11 blade and extended with Potts scissors.  Severe stenosis was encountered.  Vulnerable plaque was identified with subintimal hemorrhage suggesting recent plaque rupture.  The arteriotomy was continued until we identified healthy internal carotid artery.  I checked the backbleeding from the internal carotid artery and found it to be pulsatile.  I did not use a shunt.  A standard endarterectomy plane was developed with a Astronomer.  This was carried from the pinpoint internal carotid artery to the endpoint and common carotid artery.  An eversion endarterectomy was performed the external carotid artery.  Good endarterectomy surface was achieved.  The endarterectomy  plane was inspected  under loupe magnification and any and all debris were removed manually.  The distal endpoint was tacked down with 3 interrupted 7-0 Prolene sutures.  We then carefully inspected the plane again with copious irrigation of heparinized saline.  We did not identify any further debris, prolapse, or technical defects.  A bovine pericardial patch was prepared per manufacturer's instructions and brought onto the field.  This was trimmed slightly to accommodate the patient's anatomy.  The patch was sewn to the arteriotomy using continuous running suture of 6-0 Prolene.  Prior to completion the patch was flushed and de-aired.  The repair was completed.  The clamp was released on the external carotid artery.  Hemostasis was achieved in the patch.  Clamp was released on the common carotid artery.  Several beats of systole were allowed to proceed before releasing the clamp on the internal carotid artery.  The repair was evaluated with Doppler machine.  Normal, brisk Doppler flow was heard in all branches of the carotid arteries.  I then inspected the repair with duplex ultrasound and found no technical deficits, or debris.  Satisfied we ended the case here.  Hemostasis was again confirmed in the patch and in the surgical bed.  Heparin was reversed with protamine.  The wound was closed in layers using 3-0 Vicryl and 4-0 Monocryl.  Dermabond was applied to the skin.  The patient awoke neurologically intact in the operating room.  The exam was reconfirmed in the recovery area.  Upon completion of the case instrument and sharps counts were confirmed correct. The patient was transferred to the PACU in good condition. I was present for all portions of the procedure.  FOLLOW UP PLAN: Assuming a normal postoperative course, I will see the patient in 4 weeks with carotid duplex.   Rande Brunt. Lenell Antu, MD [facility] Phone Number: [phone] 04/25/2023 12:16  PM

## 2023-04-25 NOTE — Progress Notes (Signed)
Inpatient Rehabilitation Care Coordinator Discharge Note   Patient Details  Name: Walter Vargas MRN: [id number] Date of Birth: 1945/07/26   Discharge location: D/c to acute for surgery  Length of Stay: 35 days  Discharge activity level: Supervision  Home/community participation: Limited  Patient response NW:GNFAOZ Literacy - How often do you need to have someone help you when you read instructions, pamphlets, or other written material from your doctor or pharmacy?: Rarely  Patient response HY:QMVHQI Isolation - How often do you feel lonely or isolated from those around you?: Patient unable to respond  Services provided included: MD, RD, PT, TR, Pharmacy, Neuropsych, SW, CM, RN, OT, SLP  Financial Services:  Financial Services Utilized: Media planner (LTC Medicaid app submitted) [facility] Medicare  Choices offered to/list presented to: patient son [name])  Follow-up services arranged:  Home Health, Patient/Family has no preference for HH/DME agencies Home Health Agency: [facility] for HHPT/OT/SLP/aide/SN. Will need to confirm if able to accept as concerns about 24/7 care. Pt will need SN order sent as well.         Patient response to transportation need: Is the patient able to respond to transportation needs?: Yes In the past 12 months, has lack of transportation kept you from medical appointments or from getting medications?: No In the past 12 months, has lack of transportation kept you from meetings, work, or from getting things needed for daily living?: No   Patient/Family verbalized understanding of follow-up arrangements:  Yes  Individual responsible for coordination of the follow-up plan: contact pt son [name]  Confirmed correct DME delivered: Gretchen Short 04/25/2023    Comments (or additional information):fam edu completed. Family also reported they will possibly private pay for SNF placement as unable to get short term rehab bed as likely to  transition to LTC. No facility was willing to accept. LTC Medicaid just completed and submitted last week. Confirm with son [name] (name is Sam in computer).   Summary of Stay    Date/Time Discharge Planning CSW  04/24/23 0902 Pt to OR tomorrow. D/c is pending skilled bed offer and then insruance approval; because no LTC Mediciad in place this serves as barrier for placement and also no discharge plan. if unable to get bed, pt will transition to home. Pt needs to discharge to home Mod I as there is limited support at discharge. Pt son will come around 2/6 or 2/7 to visit with pt and will not be here long. SW emailed pt son Medicaid application; Medicaid application also left in room for pt family friends to assist. HHPT/OT/SLP/aideRN with [facility]- concerned about pt discharging to home without 24/7 care and may likely rescind offer. SW will confirm there are no barriers to discharge. AAC  04/16/23 1023 D/c is pending skilled bed offer and then insruance approval; because no LTC Mediciad in place this serves as barrier for placement and also no discharge plan. if unable to get bed, pt will transition to home. Pt needs to discharge to home Mod I as there is limited support at discharge. Pt son will come around 2/6 or 2/7 to visit with pt and will not be here long. SW emailed pt son Medicaid application; Medicaid application also left in room for pt family friends to assist. HHPT[facility]RN [facility]. SW will confirm there are no barriers to discharge. AAC  04/09/23 1413 D/c is pending skilled bed offer and then insruance approval. if unable to get bed, pt will transition to home. Pt needs  to discharge to home Mod I as there is limited support at discharge. Pt son will come around 2/6 or 2/7 to visit with pt and will not be here long. SW emailed pt son Medicaid application; Medicaid application also left in room for pt family friends to assist. HHPT/OT/SLP/[facility]ideRN with [facility]. SW will confirm there  are no barriers to discharge. AAC  04/02/23 1338 Pt needs to discharge to home Mod I as there is limited support at discharge. SW waiting on updates on who will come to be with pt for the first 1-2 weeks at discharge. SW emailed pt son Medicaid application. HHPT/OT/SLP/aide with [facility]. SW will confirm there are no barriers to discharge. AAC  03/26/23 1538 pt needs to discharge to home Mod I as there is limited support at discharge. SW unsure if family support at discharge. SW will confirm there are no barriers to discharge. AAC       Nimco Bivens A Lula Olszewski

## 2023-04-25 NOTE — Anesthesia Postprocedure Evaluation (Signed)
Anesthesia Post Note  Patient: Walter Vargas  Procedure(s) Performed: ENDARTERECTOMY CAROTID USING 1X6 CM XENOSURE BIOLOGIC PATCH (Left)     Patient location during evaluation: PACU Anesthesia Type: General Level of consciousness: awake and alert Pain management: pain level controlled Vital Signs Assessment: post-procedure vital signs reviewed and stable Respiratory status: spontaneous breathing, nonlabored ventilation and respiratory function stable Cardiovascular status: blood pressure returned to baseline and stable Postop Assessment: no apparent nausea or vomiting Anesthetic complications: no  No notable events documented.  Last Vitals:  Vitals:   04/25/23 1430 04/25/23 1445  BP: (!) 119/47 (!) 117/49  Pulse: (!) 56 (!) 57  Resp: 14 12  Temp:  36.6 C  SpO2: 100% 98%    Last Pain:  Vitals:   04/25/23 1400  TempSrc:   PainSc: 0-No pain                 Deanda Ruddell,W. EDMOND

## 2023-04-25 NOTE — Transfer of Care (Signed)
Immediate Anesthesia Transfer of Care Note  Patient: SHAMAR ENGELMANN  Procedure(s) Performed: ENDARTERECTOMY CAROTID USING 1X6 CM XENOSURE BIOLOGIC PATCH (Left)  Patient Location: PACU  Anesthesia Type:General  Level of Consciousness: awake and patient cooperative  Airway & Oxygen Therapy: Patient Spontanous Breathing and Patient connected to nasal cannula oxygen  Post-op Assessment: Report given to RN and Post -op Vital signs reviewed and stable  Post vital signs: Reviewed and stable  Look to PACU RN charting for first set of VS around 1217 pm  Last Vitals:  Vitals Value Taken Time  BP 116/47 04/25/23 1400  Temp 36.7 C 04/25/23 1215  Pulse 56 04/25/23 1413  Resp 20 04/25/23 1413  SpO2 100 % 04/25/23 1413  Vitals shown include unfiled device data.  Last Pain:  Vitals:   04/25/23 1315  TempSrc:   PainSc: 0-No pain         Complications: No notable events documented.

## 2023-04-26 ENCOUNTER — Encounter (HOSPITAL_COMMUNITY): Payer: Self-pay | Admitting: Vascular Surgery

## 2023-04-26 LAB — LIPID PANEL
Cholesterol: 100 mg/dL (ref 0–200)
HDL: 53 mg/dL (ref >40)
LDL Cholesterol: 38 mg/dL (ref 0–99)
Total CHOL/HDL Ratio: 1.9 {ratio}
Triglycerides: 47 mg/dL (ref <150)
VLDL: 9 mg/dL (ref 0–40)

## 2023-04-26 LAB — BASIC METABOLIC PANEL
Anion gap: 8 (ref 5–15)
BUN: 26 mg/dL — ABNORMAL HIGH (ref 8–23)
CO2: 25 mmol/L (ref 22–32)
Calcium: 8.4 mg/dL — ABNORMAL LOW (ref 8.9–10.3)
Chloride: 102 mmol/L (ref 98–111)
Creatinine, Ser: 0.78 mg/dL (ref 0.61–1.24)
GFR, Estimated: >60 mL/min (ref >60)
Glucose, Bld: 132 mg/dL — ABNORMAL HIGH (ref 70–99)
Potassium: 4.4 mmol/L (ref 3.5–5.1)
Sodium: 135 mmol/L (ref 135–145)

## 2023-04-26 LAB — CBC
HCT: 29.7 % — ABNORMAL LOW (ref 39.0–52.0)
Hemoglobin: 9.4 g/dL — ABNORMAL LOW (ref 13.0–17.0)
MCH: 28.3 pg (ref 26.0–34.0)
MCHC: 31.6 g/dL (ref 30.0–36.0)
MCV: 89.5 fL (ref 80.0–100.0)
Platelets: 174 10*3/uL (ref 150–400)
RBC: 3.32 MIL/uL — ABNORMAL LOW (ref 4.22–5.81)
RDW: 18.2 % — ABNORMAL HIGH (ref 11.5–15.5)
WBC: 7.1 10*3/uL (ref 4.0–10.5)
nRBC: 0 % (ref 0.0–0.2)

## 2023-04-26 LAB — HEPARIN LEVEL (UNFRACTIONATED): Heparin Unfractionated: 0.24 [IU]/mL — ABNORMAL LOW (ref 0.30–0.70)

## 2023-04-26 LAB — APTT: aPTT: 35 s (ref 24–36)

## 2023-04-26 MED ORDER — HEPARIN (PORCINE) 25000 UT/250ML-% IV SOLN
1050.0000 [IU]/h | INTRAVENOUS | Status: DC
  Administered 2023-04-26: 900 [IU]/h via INTRAVENOUS
  Filled 2023-04-26: qty 250

## 2023-04-26 MED ORDER — MUPIROCIN 2 % EX OINT
1.0000 | TOPICAL_OINTMENT | Freq: Two times a day (BID) | CUTANEOUS | Status: AC
  Administered 2023-04-26 – 2023-04-30 (×10): 1 via NASAL
  Filled 2023-04-26 (×2): qty 22

## 2023-04-26 MED ORDER — LORAZEPAM 2 MG/ML IJ SOLN
0.5000 mg | INTRAMUSCULAR | Status: DC | PRN
  Administered 2023-04-27: 0.5 mg via INTRAVENOUS
  Filled 2023-04-26: qty 1

## 2023-04-26 MED ORDER — CHLORHEXIDINE GLUCONATE CLOTH 2 % EX PADS
6.0000 | MEDICATED_PAD | Freq: Every day | CUTANEOUS | Status: AC
  Administered 2023-04-26 – 2023-04-30 (×5): 6 via TOPICAL

## 2023-04-26 NOTE — Progress Notes (Signed)
PHARMACY - ANTICOAGULATION CONSULT NOTE  Pharmacy Consult for heparin Indication:  cardiomyopathy with CVA  No Active Allergies  Patient Measurements: Height: 5\' 8"  (172.7 cm) Weight: 51 kg (112 lb 7 oz) IBW/kg (Calculated) : 68.4 Heparin Dosing Weight: ~51 kg  Labs: - Hgb 12.9, platelet count 198 on 04/16/23  Estimated Creatinine Clearance: 55.8 mL/min (by C-G formula based on SCr of 0.78 mg/dL).   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 Pharmacy consulted to dose Eliquis on 04/20/23. CBC stable; no bleeding reported.   Pt was transferred back from CIR for CEA 2/12. Ok to resume heparin today with lower goal. Will follow PTT and HL until correlation.   Hgb 9.4 Plt wnl  PM heparin level 0.24  Goal of Therapy:  Heparin level 0.3-0.5 units/ml aPTT 66-85 seconds Monitor platelets by anticoagulation protocol: Yes   Plan:  Increase heparin to 1000 units / hr Daily  heparin level ; daily CBC. F/u resuming apixaban  Thank you Okey Regal, PharmD 04/26/2023 7:40 PM

## 2023-04-26 NOTE — Progress Notes (Signed)
Patient is refusing SCDs BLE. He is on heparin drip.

## 2023-04-26 NOTE — Progress Notes (Signed)
PHARMACIST LIPID MONITORING   Walter Vargas is a 78 y.o. male admitted on 04/25/2023 with PAD.  Pharmacy has been consulted to optimize lipid-lowering therapy with the indication of secondary prevention for clinical ASCVD.  Recent Labs:  Lipid Panel (last 6 months):   Lab Results  Component Value Date   CHOL 100 04/26/2023   TRIG 47 04/26/2023   HDL 53 04/26/2023   CHOLHDL 1.9 04/26/2023   VLDL 9 04/26/2023   LDLCALC 38 04/26/2023    Hepatic function panel (last 6 months):   Lab Results  Component Value Date   AST 34 04/17/2023   ALT 38 04/17/2023   ALKPHOS 104 04/17/2023   BILITOT 0.8 04/17/2023   BILIDIR 0.3 (H) 03/02/2023   IBILI 0.8 03/02/2023    SCr (since admission):   Serum creatinine: 0.78 mg/dL 16/10/96 0454 Estimated creatinine clearance: 55.8 mL/min  Current therapy and lipid therapy tolerance Current lipid-lowering therapy: atorvastatin 80mg  qday Previous lipid-lowering therapies (if applicable):  Documented or reported allergies or intolerances to lipid-lowering therapies (if applicable):   Assessment:   Patient prefers no changes in lipid-lowering therapy at this time due to already on statins and at goal  Plan:    1.Statin intensity (high intensity recommended for all patients regardless of the LDL):  No statin changes. The patient is already on a high intensity statin.  2.Add ezetimibe (if any one of the following):   Not indicated at this time.  3.Refer to lipid clinic:   No  4.Follow-up with:  Primary care provider - Walter Emms, NP  5.Follow-up labs after discharge:  No changes in lipid therapy, repeat a lipid panel in one year.      Walter Vargas, PharmD, BCIDP, AAHIVP, CPP Infectious Disease Pharmacist 04/26/2023 9:21 AM

## 2023-04-26 NOTE — Progress Notes (Signed)
  Progress Note    04/26/2023 8:59 AM 1 Day Post-Op  Subjective:  no complaints this morning    Vitals:   04/26/23 0600 04/26/23 0750  BP: 118/62 119/71  Pulse: 65 70  Resp: (!) 23 18  Temp:  97.9 F (36.6 C)  SpO2: 98% 98%    Physical Exam: General:  resting comfortably, orientation at baseline Cardiac:  regular Lungs:  nonlabored Incisions:  left sided neck incision c/d/l without hematoma. Continued mild bruising but soft Extremities:  moving all extremities equally Neuro: no slurred speech, facial droop, weakness or numbness. Somewhat confused at baseline but able to orient for short periods of time  CBC    Component Value Date/Time   WBC 7.1 04/26/2023 0440   RBC 3.32 (L) 04/26/2023 0440   HGB 9.4 (L) 04/26/2023 0440   HCT 29.7 (L) 04/26/2023 0440   PLT 174 04/26/2023 0440   MCV 89.5 04/26/2023 0440   MCH 28.3 04/26/2023 0440   MCHC 31.6 04/26/2023 0440   RDW 18.2 (H) 04/26/2023 0440   LYMPHSABS 0.8 03/28/2023 0536   MONOABS 0.8 03/28/2023 0536   EOSABS 0.2 03/28/2023 0536   BASOSABS 0.1 03/28/2023 0536    BMET    Component Value Date/Time   NA 135 04/26/2023 0440   K 4.4 04/26/2023 0440   CL 102 04/26/2023 0440   CO2 25 04/26/2023 0440   GLUCOSE 132 (H) 04/26/2023 0440   BUN 26 (H) 04/26/2023 0440   CREATININE 0.78 04/26/2023 0440   CALCIUM 8.4 (L) 04/26/2023 0440   GFRNONAA >60 04/26/2023 0440    INR    Component Value Date/Time   INR 1.6 (H) 03/03/2023 0314     Intake/Output Summary (Last 24 hours) at 04/26/2023 0859 Last data filed at 04/26/2023 0600 Gross per 24 hour  Intake 1641.5 ml  Output 945 ml  Net 696.5 ml      Assessment/Plan:  78 y.o. male is 1 day post op, s/p:  left carotid endarterectomy    -Doing well this morning.  Denies any pain at his incision site -Left-sided neck incision remains clean dry and intact without hematoma.  There is mild bruising present, however this is soft -Remains hemodynamically stable.   Hemoglobin 9.4 -Orientation at baseline.  Remains neurologically intact without any deficits -Continue aspirin, Plavix, statin.  Will consult pharmacy to restart heparin drip today -Likely discharge to SNF in the next coming days -Okay to mobilize with PT/OT   Loel Dubonnet, PA-C [facility] [phone] 04/26/2023 8:59 AM

## 2023-04-26 NOTE — Progress Notes (Signed)
PHARMACY - ANTICOAGULATION CONSULT NOTE  Pharmacy Consult for heparin Indication:  cardiomyopathy with CVA  No Active Allergies  Patient Measurements: Height: 5\' 8"  (172.7 cm) Weight: 51 kg (112 lb 7 oz) IBW/kg (Calculated) : 68.4 Heparin Dosing Weight: ~51 kg  Labs: - Hgb 12.9, platelet count 198 on 04/16/23  Estimated Creatinine Clearance: 55.8 mL/min (by C-G formula based on SCr of 0.78 mg/dL).   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 Pharmacy consulted to dose Eliquis on 04/20/23. CBC stable; no bleeding reported.   Pt was transferred back from CIR for CEA 2/12. Ok to resume heparin today with lower goal. Will follow PTT and HL until correlation.   Hgb 9.4 Plt wnl  Goal of Therapy:  Heparin level 0.3-0.5 units/ml aPTT 66-85 seconds Monitor platelets by anticoagulation protocol: Yes   Plan:  Resume heparin drip at 900 units/hr. Check 8 hr PTT/HL Daily aPTT and heparin level until correlating; daily CBC. F/u resuming apixaban  [name], PharmD, BCIDP, AAHIVP, CPP Infectious Disease Pharmacist 04/26/2023 9:19 AM

## 2023-04-27 LAB — HEPARIN LEVEL (UNFRACTIONATED): Heparin Unfractionated: 0.38 [IU]/mL (ref 0.30–0.70)

## 2023-04-27 LAB — CBC
HCT: 31.4 % — ABNORMAL LOW (ref 39.0–52.0)
Hemoglobin: 9.7 g/dL — ABNORMAL LOW (ref 13.0–17.0)
MCH: 27.7 pg (ref 26.0–34.0)
MCHC: 30.9 g/dL (ref 30.0–36.0)
MCV: 89.7 fL (ref 80.0–100.0)
Platelets: 184 10*3/uL (ref 150–400)
RBC: 3.5 MIL/uL — ABNORMAL LOW (ref 4.22–5.81)
RDW: 18.6 % — ABNORMAL HIGH (ref 11.5–15.5)
WBC: 8.6 10*3/uL (ref 4.0–10.5)
nRBC: 0 % (ref 0.0–0.2)

## 2023-04-27 LAB — APTT: aPTT: 86 s — ABNORMAL HIGH (ref 24–36)

## 2023-04-27 MED ORDER — APIXABAN 5 MG PO TABS
5.0000 mg | ORAL_TABLET | Freq: Two times a day (BID) | ORAL | Status: DC
  Administered 2023-04-27 – 2023-05-04 (×15): 5 mg via ORAL
  Filled 2023-04-27 (×15): qty 1

## 2023-04-27 NOTE — Evaluation (Signed)
Occupational Therapy Evaluation Patient Details Name: Walter Vargas MRN: [id number] DOB: [birth year] Today's Date: 04/27/2023   History of Present Illness   77 y.o. male presents to [facility] 04/25/23 from AIR w/ agitation, confusion, and cognitive decline s/p left Carotid Endarterectomy. 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  Recent admit where pt had an NSTEMI requiring impella and underwent CABG. PMHx: HTN, HFrEF, mitral regurgiation, CABG (12/24)     Clinical Impressions This 78 yo male admitted with above presents to acute OT with PLOF before CABG of being totally independent and since CABG and on AIR was at at setup/S-min A level. Currently he is setup/S-min A for basic ADLs at a RW level, is impulsive,can get fixated on what is on his mind (today it was to call dtr), and has decreased memory all affecting his safety and independence with basic ADLs and IADLs, he will continue to benefit from acute OT with follow up from continued inpatient follow up therapy, <3 hours/day      If plan is discharge home, recommend the following:   A little help with walking and/or transfers;A little help with bathing/dressing/bathroom;Assistance with cooking/housework;Assist for transportation;Help with stairs or ramp for entrance;Direct supervision/assist for financial management;Direct supervision/assist for medications management     Functional Status Assessment   Patient has had a recent decline in their functional status and demonstrates the ability to make significant improvements in function in a reasonable and predictable amount of time.     Equipment Recommendations   Other (comment) (TBD next venue)      Precautions/Restrictions   Precautions Precautions: Fall Precaution/Restrictions Comments: impulsive, sternal precautions CABG (12/24) Restrictions Weight Bearing Restrictions Per Provider Order: Yes Other Position/Activity Restrictions:  sternal precautions     Mobility Bed Mobility Overal bed mobility: Needs Assistance Bed Mobility: Supine to Sit     Supine to sit: Contact guard     General bed mobility comments: CGA for safety, cues for technique    Transfers Overall transfer level: Needs assistance Equipment used: Rolling walker (2 wheels) Transfers: Sit to/from Stand Sit to Stand: Min assist           General transfer comment: on initial stand, pt with significant posterior lean and unable to maintain balance. Able to stand with MinA for steadying and boost with UE next to knees      Balance Overall balance assessment: Needs assistance Sitting-balance support: No upper extremity supported, Feet supported Sitting balance-Leahy Scale: Fair Sitting balance - Comments: while donning sock at EOB   Standing balance support: Bilateral upper extremity supported, During functional activity, Reliant on assistive device for balance Standing balance-Leahy Scale: Poor Standing balance comment: posterior lean, Needs UE support                           ADL either performed or assessed with clinical judgement   ADL Overall ADL's : Needs assistance/impaired Eating/Feeding: Set up;Sitting Eating/Feeding Details (indicate cue type and reason): in recliner Grooming: Set up;Supervision/safety;Sitting   Upper Body Bathing: Set up;Supervision/ safety;Sitting   Lower Body Bathing: Minimal assistance;Sit to/from stand   Upper Body Dressing : Supervision/safety;Set up;Sitting   Lower Body Dressing: Minimal assistance Lower Body Dressing Details (indicate cue type and reason): Min A sit<>stand Toilet Transfer: Minimal assistance;Ambulation;Rolling walker (2 wheels)   Toileting- Clothing Manipulation and Hygiene: Minimal assistance Toileting - Clothing Manipulation Details (indicate cue type and reason): CGA sit <>stand  Vision Patient Visual Report: No change from baseline               Extremity/Trunk Assessment Upper Extremity Assessment Upper Extremity Assessment: Overall WFL for tasks assessed   Lower Extremity Assessment Lower Extremity Assessment: Generalized weakness   Cervical / Trunk Assessment Cervical / Trunk Assessment: Kyphotic   Communication Communication Communication: No apparent difficulties   Cognition Arousal: Alert Behavior During Therapy: Agitated, Impulsive Cognition: Cognition impaired   Orientation impairments: Time Awareness: Intellectual awareness impaired Memory impairment (select all impairments): Declarative long-term memory Attention impairment (select first level of impairment): Sustained attention Executive functioning impairment (select all impairments): Reasoning                   Following commands: Impaired Following commands impaired: Follows one step commands with increased time     Cueing    Cueing Techniques: Verbal cues;Tactile cues              Home Living Family/patient expects to be discharged to:: Skilled nursing facility                             Home Equipment: Rollator (4 wheels) (walking stick)          Prior Functioning/Environment Prior Level of Function : Needs assist             Mobility Comments: pt previously at CIR, supervision for mobility with RW. Impulsive with movements ADLs Comments: previously at CIR prior to being re-admitted back to acute--supervision there    OT Problem List: Impaired balance (sitting and/or standing);Decreased cognition;Decreased safety awareness   OT Treatment/Interventions: Self-care/ADL training;Patient/family education;Balance training;DME and/or AE instruction      OT Goals(Current goals can be found in the care plan section)   Acute Rehab OT Goals Patient Stated Goal: to call wife (it was actually his dtr) OT Goal Formulation: With patient Time For Goal Achievement: 05/11/23 Potential to Achieve Goals: Good    OT Frequency:  Min 1X/week    Co-evaluation PT/OT/SLP Co-Evaluation/Treatment: Yes Reason for Co-Treatment: Necessary to address cognition/behavior during functional activity;To address functional/ADL transfers;For patient/therapist safety PT goals addressed during session: Mobility/safety with mobility;Balance;Proper use of DME OT goals addressed during session: ADL's and self-care;Strengthening/ROM      AM-PAC OT "6 Clicks" Daily Activity     Outcome Measure Help from another person eating meals?: A Little Help from another person taking care of personal grooming?: A Little Help from another person toileting, which includes using toliet, bedpan, or urinal?: A Little Help from another person bathing (including washing, rinsing, drying)?: A Little Help from another person to put on and taking off regular upper body clothing?: A Little Help from another person to put on and taking off regular lower body clothing?: A Little 6 Click Score: 18   End of Session Equipment Utilized During Treatment: Gait belt;Rolling walker (2 wheels) Nurse Communication: Mobility status  Activity Tolerance: Patient tolerated treatment well Patient left: in chair;with call bell/phone within reach;with chair alarm set  OT Visit Diagnosis: Unsteadiness on feet (R26.81);Other abnormalities of gait and mobility (R26.89);Muscle weakness (generalized) (M62.81);Other symptoms and signs involving cognitive function                Time: 1610-9604 OT Time Calculation (min): 25 min Charges:  OT General Charges $OT Visit: 1 Visit OT Evaluation $OT Eval Moderate Complexity: 1 Mod  Cathy L. OT Acute Rehabilitation Services Office [phone]  Evette Georges 04/27/2023, 11:03 AM

## 2023-04-27 NOTE — Progress Notes (Signed)
PHARMACY - ANTICOAGULATION CONSULT NOTE  Pharmacy Consult for heparin Indication:  cardiomyopathy in setting of h/o CVA  Labs: Recent Labs    04/25/23 0525 04/25/23 0934 04/26/23 0440 04/26/23 1739 04/27/23 0344  HGB 10.8* 12.6* 9.4*  --  9.7*  HCT 33.8* 37.0* 29.7*  --  31.4*  PLT 207  --  174  --  184  APTT 102*  --   --  35 86*  HEPARINUNFRC 0.53  --   --  0.24* 0.38  CREATININE  --  0.70 0.78  --   --    Assessment/Plan:  78yo male therapeutic on heparin after rate change. Will continue infusion at current rate of 1050 units/hr (previous note says 1000 units/hr but ordered and running at 1050 units/hr) and confirm stable with additional level.  Vernard Gambles, PharmD, BCPS 04/27/2023 5:00 AM

## 2023-04-27 NOTE — Progress Notes (Addendum)
  Progress Note    04/27/2023 6:39 AM 2 Days Post-Op  Subjective:  no complaints  Afebrile HR 50's-70's  90's-110's systolic 98% RA  Vitals:   04/27/23 0000 04/27/23 0400  BP: (!) 115/54 97/65  Pulse: 71 68  Resp: 20 15  Temp: 97.7 F (36.5 C) 97.6 F (36.4 C)  SpO2:      Physical Exam: General:  no distress.  Sleeping and wakes easily Cardiac:  regular Lungs:  non labored Incisions:  clean with ecchymosis Extremities:  moving all extremities equally   CBC    Component Value Date/Time   WBC 8.6 04/27/2023 0344   RBC 3.50 (L) 04/27/2023 0344   HGB 9.7 (L) 04/27/2023 0344   HCT 31.4 (L) 04/27/2023 0344   PLT 184 04/27/2023 0344   MCV 89.7 04/27/2023 0344   MCH 27.7 04/27/2023 0344   MCHC 30.9 04/27/2023 0344   RDW 18.6 (H) 04/27/2023 0344   LYMPHSABS 0.8 03/28/2023 0536   MONOABS 0.8 03/28/2023 0536   EOSABS 0.2 03/28/2023 0536   BASOSABS 0.1 03/28/2023 0536    BMET    Component Value Date/Time   NA 135 04/26/2023 0440   K 4.4 04/26/2023 0440   CL 102 04/26/2023 0440   CO2 25 04/26/2023 0440   GLUCOSE 132 (H) 04/26/2023 0440   BUN 26 (H) 04/26/2023 0440   CREATININE 0.78 04/26/2023 0440   CALCIUM 8.4 (L) 04/26/2023 0440   GFRNONAA >60 04/26/2023 0440    INR    Component Value Date/Time   INR 1.6 (H) 03/03/2023 0314     Intake/Output Summary (Last 24 hours) at 04/27/2023 1610 Last data filed at 04/27/2023 9604 Gross per 24 hour  Intake 680 ml  Output 250 ml  Net 430 ml      Assessment/Plan:  78 y.o. male is s/p:  Left CEA for symptomatic left carotid artery stenosis  2 Days Post-Op   -pt neuro in tact -incision looks good with some ecchymosis -continue asa/statin -DVT prophylaxis:  heparin gtt.  Add Eliquis back closer to discharge. -plans for SNF -continue to get oob   Doreatha Massed, PA-C [facility] [phone] 04/27/2023 6:39 AM   VASCULAR STAFF ADDENDUM: I have independently interviewed and  examined the patient. I agree with the above.  Transition heparin to Eliquis. Dispo planning - likely SNF.  Rande Brunt. Lenell Antu, MD [facility] Phone Number: [phone] 04/27/2023 8:56 AM

## 2023-04-27 NOTE — Care Management Important Message (Signed)
Important Message  Patient Details  Name: Walter Vargas MRN: [id number] Date of Birth: 10-08-1945   Important Message Given:  Yes - Medicare IM     Renie Ora 04/27/2023, 11:11 AM

## 2023-04-27 NOTE — Progress Notes (Signed)
During change of shift report, night RN noted that L carotid site appeared larger and more indurated than previous night. Site is unchanged from this RN 7a-7p shift. Dr. Hetty Blend called and advised to continue to monitor site and call for increase in size or any trouble with breathing.

## 2023-04-27 NOTE — Progress Notes (Signed)
PHARMACY - ANTICOAGULATION CONSULT NOTE  Pharmacy Consult for heparin>>apixaban Indication:  cardiomyopathy with CVA  No Active Allergies  Patient Measurements: Height: 5\' 8"  (172.7 cm) Weight: 51 kg (112 lb 7 oz) IBW/kg (Calculated) : 68.4 Heparin Dosing Weight: ~51 kg  Labs: - Hgb 12.9, platelet count 198 on 04/16/23  Estimated Creatinine Clearance: 55.8 mL/min (by C-G formula based on SCr of 0.78 mg/dL).   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 Pharmacy consulted to dose Eliquis on 04/20/23. CBC stable; no bleeding reported.   Plan to transition back to apixaban today per VVS. Hgb 9s, plt wnl  Copay $0  Goal of Therapy:  Monitor platelets by anticoagulation protocol: Yes   Plan:  Dc heparin Apixaban 5mg  PO BID Rx will monitor peripherally  [name], PharmD, BCIDP, AAHIVP, CPP Infectious Disease Pharmacist 04/27/2023 9:12 AM

## 2023-04-27 NOTE — Evaluation (Addendum)
Physical Therapy Evaluation Patient Details Name: Walter Vargas MRN: [id number] DOB: [birth year] Today's Date: 04/27/2023  History of Present Illness  78 y.o. male presents to [facility] 04/25/23 from CIR w/ agitation, confusion, and cognitive decline. Recent admit where pt had an NSTEMI requiring impella and underwent CABG. 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 PMHx: HTN, HFrEF, mitral regurgiation, CABG (12/24),   Clinical Impression  Pt in bed upon arrival and agreeable to PT eval. Pt presents with condition above and deficits mentioned below, see PT Problem List. PTA, pt was in CIR and was supervision for mobility with a RW. Pt was impulsive in today's session and needed cues for sequencing. Pt initially had a posterior lean upon standing and required two attempts with MinA and RW. Pt was able to ambulate ~15 ft with CGA and RW. Deferred further gait due to pt perseverating on calling his "wife, Walter Vargas", who is actually his daughter. Pt A&Ox2 at time of eval. Pt would benefit from acute skilled PT to address functional impairments. Recommending <3hrs Vargas-acute to work towards independence with mobility. Acute PT to follow.          If plan is discharge home, recommend the following: A little help with walking and/or transfers;A little help with bathing/dressing/bathroom;Assistance with cooking/housework;Assist for transportation;Help with stairs or ramp for entrance;Supervision due to cognitive status   Can travel by private vehicle   Yes    Equipment Recommendations Rolling walker (2 wheels);BSC/3in1     Functional Status Assessment Patient has had a recent decline in their functional status and demonstrates the ability to make significant improvements in function in a reasonable and predictable amount of time.     Precautions / Restrictions Precautions Precautions: Fall Precaution/Restrictions Comments:  impulsive, sternal precautions CABG (12/24) Restrictions Weight Bearing Restrictions Per Provider Order: No      Mobility  Bed Mobility Overal bed mobility: Needs Assistance Bed Mobility: Supine to Sit    Supine to sit: Contact guard    General bed mobility comments: CGA for safety, cues for technique    Transfers Overall transfer level: Needs assistance Equipment used: Rolling walker (2 wheels) Transfers: Sit to/from Stand Sit to Stand: Min assist    General transfer comment: on initial stand, pt with significant posterior lean and unable to maintain balance. Able to stand with MinA for steadying and boost with UE next to knees    Ambulation/Gait Ambulation/Gait assistance: Contact guard assist Gait Distance (Feet): 15 Feet Assistive device: Rolling walker (2 wheels) Gait Pattern/deviations: Step-through pattern, Decreased stride length, Leaning posteriorly, Narrow base of support, Trunk flexed Gait velocity: Decreased     General Gait Details: CGA for safety, further gait training deferred due to pt being fixated on calling daughter    Balance Overall balance assessment: Needs assistance, Mild deficits observed, not formally tested Sitting-balance support: No upper extremity supported, Feet supported Sitting balance-Leahy Scale: Fair   Postural control: Posterior lean Standing balance support: Bilateral upper extremity supported, During functional activity, Reliant on assistive device for balance Standing balance-Leahy Scale: Poor Standing balance comment: posterior lean, Needs UE support        Pertinent Vitals/Pain Pain Assessment Pain Assessment: No/denies pain    Home Living Family/patient expects to be discharged to:: Skilled nursing facility    Prior Function Prior Level of Function : Needs assist    Mobility Comments: pt previously at CIR, supervision for mobility with RW. Impulsive with movements  ADLs Comments: supervision     Extremity/Trunk  Assessment   Upper Extremity Assessment Upper Extremity Assessment: Defer to OT evaluation    Lower Extremity Assessment Lower Extremity Assessment: Generalized weakness    Cervical / Trunk Assessment Cervical / Trunk Assessment: Kyphotic  Communication   Communication Communication: No apparent difficulties    Cognition Arousal: Alert Behavior During Therapy: Agitated, Impulsive   PT - Cognitive impairments: No family/caregiver present to determine baseline, Orientation, Memory, Initiation, Problem solving, Sequencing, Safety/Judgement   Orientation impairments: Time, Situation    PT - Cognition Comments: not oriented to year, believes his daughter is his wife, unsure why he is in the hospital. Impulsive with mobility with little insight into deficits Following commands: Impaired Following commands impaired: Follows one step commands with increased time     Cueing Cueing Techniques: Verbal cues, Tactile cues     General Comments General comments (skin integrity, edema, etc.): VSS on RA     PT Assessment Patient needs continued PT services  PT Problem List Decreased strength;Decreased range of motion;Decreased activity tolerance;Decreased balance;Decreased mobility;Decreased knowledge of use of DME;Decreased cognition;Decreased safety awareness;Decreased knowledge of precautions       PT Treatment Interventions DME instruction;Gait training;Stair training;Functional mobility training;Therapeutic activities;Therapeutic exercise;Balance training;Patient/family education;Cognitive remediation;Neuromuscular re-education    PT Goals (Current goals can be found in the Care Plan section)  Acute Rehab PT Goals Patient Stated Goal: to return home PT Goal Formulation: With patient/family Time For Goal Achievement: 05/11/23 Potential to Achieve Goals: Good    Frequency Min 1X/week     Co-evaluation   Reason for Co-Treatment: Necessary to address cognition/behavior during  functional activity;To address functional/ADL transfers;For patient/therapist safety PT goals addressed during session: Mobility/safety with mobility;Balance;Proper use of DME         AM-PAC PT "6 Clicks" Mobility  Outcome Measure Help needed turning from your back to your side while in a flat bed without using bedrails?: A Little Help needed moving from lying on your back to sitting on the side of a flat bed without using bedrails?: A Little Help needed moving to and from a bed to a chair (including a wheelchair)?: A Little Help needed standing up from a chair using your arms (e.g., wheelchair or bedside chair)?: A Little Help needed to walk in hospital room?: A Little Help needed climbing 3-5 steps with a railing? : A Lot 6 Click Score: 17    End of Session Equipment Utilized During Treatment: Gait belt Activity Tolerance: Patient tolerated treatment well Patient left: in chair;with call bell/phone within reach;with chair alarm set Nurse Communication: Mobility status PT Visit Diagnosis: Other abnormalities of gait and mobility (R26.89);Muscle weakness (generalized) (M62.81)    Time: 1610-9604 PT Time Calculation (min) (ACUTE ONLY): 19 min   Charges:   PT Evaluation $PT Eval Low Complexity: 1 Low   PT General Charges $$ ACUTE PT VISIT: 1 Visit       Hilton Cork, PT, DPT Secure Chat Preferred  Rehab Office [phone]   Arturo Morton Brion Aliment 04/27/2023, 9:52 AM

## 2023-04-28 LAB — CBC
HCT: 29.5 % — ABNORMAL LOW (ref 39.0–52.0)
Hemoglobin: 9.4 g/dL — ABNORMAL LOW (ref 13.0–17.0)
MCH: 28.9 pg (ref 26.0–34.0)
MCHC: 31.9 g/dL (ref 30.0–36.0)
MCV: 90.8 fL (ref 80.0–100.0)
Platelets: 194 10*3/uL (ref 150–400)
RBC: 3.25 MIL/uL — ABNORMAL LOW (ref 4.22–5.81)
RDW: 18.8 % — ABNORMAL HIGH (ref 11.5–15.5)
WBC: 5.4 10*3/uL (ref 4.0–10.5)
nRBC: 0 % (ref 0.0–0.2)

## 2023-04-28 NOTE — Progress Notes (Addendum)
 [facility] [place]  Subjective  - speech clear, pleasantly confused   Objective (!) 105/53 (!) 59 98.2 F (36.8 C) (Oral) 20 100%  Intake/Output Summary (Last 24 hours) at 04/28/2023 0902 Last data filed at 04/28/2023 0800 Gross per 24 hour  Intake 1530 ml  Output 750 ml  Net 780 ml    Left neck incision full with prominent healing ridge, no frank expanding hematoma Speech clear, swallowing OK, no tongue deviation or facial droop Moving all 4 ext. Palpable radial pulses equal  Assessment/Planning: S/P left CEA  Incision healing well without frank hematoma Eliquis restarted cont ASA and Statin. PT currently recommending SNF.  Prior to surgery he was in CIR.   Cont. Current care pending mobility    Mosetta Pigeon 04/28/2023 9:02 AM --  Laboratory Lab Results: Recent Labs    04/27/23 0344 04/28/23 0326  WBC 8.6 5.4  HGB 9.7* 9.4*  HCT 31.4* 29.5*  PLT 184 194   BMET Recent Labs    04/25/23 0934 04/26/23 0440  NA 136 135  K 4.4 4.4  CL 99 102  CO2  --  25  GLUCOSE 81 132*  BUN 21 26*  CREATININE 0.70 0.78  CALCIUM  --  8.4*    COAG Lab Results  Component Value Date   INR 1.6 (H) 03/03/2023   INR 1.1 03/02/2023   INR 1.1 03/01/2023   No results found for: "PTT"  VASCULAR STAFF ADDENDUM: I have independently interviewed and examined the patient. I agree with the above.  Recovering well from left CEA, small hematoma of left neck incision Okay for discharge from surgical perspective  Daria Pastures MD [facility] Phone Number: [phone] 04/28/2023 9:58 AM

## 2023-04-28 NOTE — Plan of Care (Signed)
   Problem: Clinical Measurements: Goal: Will remain free from infection Outcome: Progressing   Problem: Clinical Measurements: Goal: Diagnostic test results will improve Outcome: Progressing   Problem: Clinical Measurements: Goal: Respiratory complications will improve Outcome: Progressing   Problem: Clinical Measurements: Goal: Cardiovascular complication will be avoided Outcome: Progressing

## 2023-04-29 LAB — CBC
HCT: 29.2 % — ABNORMAL LOW (ref 39.0–52.0)
Hemoglobin: 9.3 g/dL — ABNORMAL LOW (ref 13.0–17.0)
MCH: 28.8 pg (ref 26.0–34.0)
MCHC: 31.8 g/dL (ref 30.0–36.0)
MCV: 90.4 fL (ref 80.0–100.0)
Platelets: 191 10*3/uL (ref 150–400)
RBC: 3.23 MIL/uL — ABNORMAL LOW (ref 4.22–5.81)
RDW: 18.9 % — ABNORMAL HIGH (ref 11.5–15.5)
WBC: 5.7 10*3/uL (ref 4.0–10.5)
nRBC: 0 % (ref 0.0–0.2)

## 2023-04-29 NOTE — Progress Notes (Signed)
 [facility] [place]  Subjective  - rested well   Objective (!) 101/50 (!) 57 98.1 F (36.7 C) (Oral) 19 99%  Intake/Output Summary (Last 24 hours) at 04/29/2023 0823 Last data filed at 04/28/2023 2124 Gross per 24 hour  Intake 890 ml  Output 700 ml  Net 190 ml    Left neck with mild edema/healing ridge.  No frank hematoma. Swallowing well, speech clear No tongue deviation or facial droop Moving all ext.   Assessment/Planning: S/P left CEA by Dr. Lenell Antu   Pending SNF placement Eliquis restarted tolerating well Incision without frank hematoma  Walter Vargas 04/29/2023 8:23 AM --  Laboratory Lab Results: Recent Labs    04/28/23 0326 04/29/23 0259  WBC 5.4 5.7  HGB 9.4* 9.3*  HCT 29.5* 29.2*  PLT 194 191   BMET No results for input(s): "NA", "K", "CL", "CO2", "GLUCOSE", "BUN", "CREATININE", "CALCIUM" in the last 72 hours.  COAG Lab Results  Component Value Date   INR 1.6 (H) 03/03/2023   INR 1.1 03/02/2023   INR 1.1 03/01/2023   No results found for: "PTT"

## 2023-04-29 NOTE — Plan of Care (Signed)
  Problem: Clinical Measurements: Goal: Cardiovascular complication will be avoided Outcome: Progressing   Problem: Activity: Goal: Risk for activity intolerance will decrease Outcome: Progressing   Problem: Nutrition: Goal: Adequate nutrition will be maintained Outcome: Progressing   Problem: Skin Integrity: Goal: Risk for impaired skin integrity will decrease Outcome: Progressing   Problem: Skin Integrity: Goal: Demonstration of wound healing without infection will improve Outcome: Progressing

## 2023-04-29 NOTE — Progress Notes (Signed)
 Patient was agreeable to get out of the bed and walk few steps after educating him multiple times, but at the end of the conversation and attempt to help him to get up, he refused mobility, mentioned that he couldn't sleep last night and wanted to sleep.

## 2023-04-30 LAB — CBC
HCT: 30.7 % — ABNORMAL LOW (ref 39.0–52.0)
Hemoglobin: 9.7 g/dL — ABNORMAL LOW (ref 13.0–17.0)
MCH: 28.9 pg (ref 26.0–34.0)
MCHC: 31.6 g/dL (ref 30.0–36.0)
MCV: 91.4 fL (ref 80.0–100.0)
Platelets: 223 10*3/uL (ref 150–400)
RBC: 3.36 MIL/uL — ABNORMAL LOW (ref 4.22–5.81)
RDW: 19.3 % — ABNORMAL HIGH (ref 11.5–15.5)
WBC: 5.8 10*3/uL (ref 4.0–10.5)
nRBC: 0 % (ref 0.0–0.2)

## 2023-04-30 NOTE — Progress Notes (Addendum)
  Progress Note    04/30/2023 6:51 AM 5 Days Post-Op  Subjective:  no complaints; denies trouble swallowing.    Afebrile HR 50's-60's NSR 110's-120's systolic 100% RA  Vitals:   04/29/23 2251 04/30/23 0325  BP: (!) 116/59 (!) 122/58  Pulse: 65 60  Resp: 17 19  Temp: 97.9 F (36.6 C) 97.8 F (36.6 C)  SpO2: 100% 98%    Physical Exam: General:  sleeping, wakes easily Lungs:  non labored Incisions:  left neck with ecchymosis; clean with some fullness Extremities:  moving all extremities eqully   CBC    Component Value Date/Time   WBC 5.8 04/30/2023 0313   RBC 3.36 (L) 04/30/2023 0313   HGB 9.7 (L) 04/30/2023 0313   HCT 30.7 (L) 04/30/2023 0313   PLT 223 04/30/2023 0313   MCV 91.4 04/30/2023 0313   MCH 28.9 04/30/2023 0313   MCHC 31.6 04/30/2023 0313   RDW 19.3 (H) 04/30/2023 0313   LYMPHSABS 0.8 03/28/2023 0536   MONOABS 0.8 03/28/2023 0536   EOSABS 0.2 03/28/2023 0536   BASOSABS 0.1 03/28/2023 0536    BMET    Component Value Date/Time   NA 135 04/26/2023 0440   K 4.4 04/26/2023 0440   CL 102 04/26/2023 0440   CO2 25 04/26/2023 0440   GLUCOSE 132 (H) 04/26/2023 0440   BUN 26 (H) 04/26/2023 0440   CREATININE 0.78 04/26/2023 0440   CALCIUM 8.4 (L) 04/26/2023 0440   GFRNONAA >60 04/26/2023 0440    INR    Component Value Date/Time   INR 1.6 (H) 03/03/2023 0314     Intake/Output Summary (Last 24 hours) at 04/30/2023 0651 Last data filed at 04/29/2023 2222 Gross per 24 hour  Intake 1080 ml  Output 500 ml  Net 580 ml      Assessment/Plan:  78 y.o. male is s/p:  Left CEA for symptomatic left carotid artery stenosis   5 Days Post-Op   -neuro in tact and pt without difficulty swallowing -incision is clean with some fullness and ecchymosis -will re-consult TOC for SNF placement  -DVT prophylaxis:  Eliquis   Doreatha Massed, PA-C [facility] [phone] 04/30/2023 6:51 AM   VASCULAR STAFF ADDENDUM: I have  independently interviewed and examined the patient. I agree with the above.  Small hematoma in neck without airway / swallowing compromise. At neurologic baseline. Have reached out to SW to help with disposition. Medically ready for DC.  Rande Brunt. Lenell Antu, MD [facility] Phone Number: [phone] 04/30/2023 9:26 AM

## 2023-04-30 NOTE — Progress Notes (Addendum)
   04/30/23 1536  What Happened  Was fall witnessed? No  Who witnessed fall? Jennifer,RN  Patients activity before fall to/from bed, chair, or stretcher  Point of contact buttocks  Was patient injured? Yes  Patient found on floor  Found by Staff-comment Victorino Dike ,RN)  Stated prior activity to/from bed, chair, or stretcher  Provider Notification  Provider Name/Title Samantha,PA  Date Provider Notified 04/30/23  Time Provider Notified [phone])  Method of Notification Page  Notification Reason Fall  Provider response No new orders  Date of Provider Response 04/30/23  Time of Provider Response 1552  Adult Fall Risk Assessment  Risk Factor Category (scoring not indicated) History of more than one fall within 6 months before admission (document High fall risk)  Age 78  Fall History: Fall within 6 months prior to admission 0  Elimination; Bowel and/or Urine Incontinence 0  Elimination; Bowel and/or Urine Urgency/Frequency 0  Medications: includes PCA/Opiates, Anti-convulsants, Anti-hypertensives, Diuretics, Hypnotics, Laxatives, Sedatives, and Psychotropics 5  Patient Care Equipment 0  Mobility-Assistance 2  Mobility-Gait 2  Mobility-Sensory Deficit 0  Altered awareness of immediate physical environment 0  Impulsiveness 0  Lack of understanding of one's physical/cognitive limitations 4  Total Score 15  Patient Fall Risk Level High fall risk  Adult Fall Risk Interventions  Required Bundle Interventions *See Row Information* High fall risk - low, moderate, and high requirements implemented  Screening for Fall Injury Risk (To be completed on HIGH fall risk patients) - Assessing Need for Floor Mats  Risk For Fall Injury- Criteria for Floor Mats Confusion/dementia (+NuDESC, CIWA, TBI, etc.)  Will Implement Floor Mats Yes  Vitals  Temp 98 F (36.7 C)  Temp Source Oral  BP (!) 142/64  MAP (mmHg) 83  BP Location Left Arm  BP Method Automatic  Patient Position (if appropriate)  Sitting  Pulse Rate 77  ECG Heart Rate 74  Oxygen Therapy  SpO2 93 %  O2 Device Room Air  Neurological  Level of Consciousness Alert

## 2023-04-30 NOTE — TOC Initial Note (Addendum)
 Transition of Care [facility]) - Initial/Assessment Note    Patient Details  Name: Walter Vargas MRN: [id number] Date of Birth: 1945/12/28  [facility]) CM/SW Contact:    Walter Roux, LCSW Phone Number: 04/30/2023, 1:57 PM  Clinical Narrative:                  Patient arrived from CIR- current recommendations is for short term rehab at SNF.   1:26pm- patient's son Walter Vargas, returned CSW call. He states he is aware and agrees to short term rehab at [facility] recommendations. CSW explained the SNF process.  He advised, the family was considering LTC but after further discussion with patient and family, they decided to seek 24/7 HH agency to assist patient after rehab. Including, patient is not eligible for medicaid at this time. 2:20pm- CSW met with patient- CSW introduced self and explained role. CSW called patient's son, Walter Vargas placed on speaker. Patient's son reports he lives in [place] but travels here frequently to assist with his father's care. Patient also has a daughter that lives in [place]. Patient reports he lives home alone. Patient expressed he is ready to return home and states he has been here too long. Patient states he lives in an apartment w/ neighbors close by that can assist him in the home. Walter Vargas, acknowledges the patient desire to go home but explained it best he get short term rehab before returning home. Walter Vargas states the family is seeking 24/7 HH agency to help assist with the patient's care in the home after rehab. Patient agrees to the plan- CSW encourage the patient to continue to participate with therapy.  TOC will provide bed offers once available  TOC will continue to follow and assist with discharge planning.  Walter Vargas, MSW, LCSW Clinical Social Worker     Expected Discharge Plan: Skilled Nursing Facility Barriers to Discharge: English as a second language teacher, SNF Pending bed offer   Patient Goals and CMS Choice            Expected Discharge  Plan and Services In-house Referral: Clinical Social Work     Living arrangements for the past 2 months: Single Family Home                                      Prior Living Arrangements/Services Living arrangements for the past 2 months: Single Family Home Lives with:: Self Patient language and need for interpreter reviewed:: No        Need for Family Participation in Patient Care: Yes (Comment) Care giver support system in place?: No (comment)   Criminal Activity/Legal Involvement Pertinent to Current Situation/Hospitalization: No - Comment as needed  Activities of Daily Living      Permission Sought/Granted Permission sought to share information with : Family Supports Permission granted to share information with :  (patint  confused at times)  Share Information with NAME: Walter Vargas  Permission granted to share info w AGENCY: SNFs  Permission granted to share info w Relationship: son  Permission granted to share info w Contact Information: [phone]  Emotional Assessment       Orientation: : Oriented to Situation, Oriented to Place, Oriented to Self Alcohol / Substance Use: Not Applicable Psych Involvement: No (comment)  Admission diagnosis:  Status post carotid endarterectomy [Z98.890] Patient Active Problem List   Diagnosis Date Noted   Status post carotid endarterectomy 04/25/2023   Acute ischemic left middle  cerebral artery (MCA) stroke (HCC) 04/19/2023   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, without accompanying behavioral or psychological disturbance (HCC) 04/04/2023   Pressure injury of skin 03/20/2023   Debility 03/20/2023   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   Postoperative shock, cardiogenic (HCC) 03/09/2023   Protein-calorie malnutrition, severe 03/03/2023   Cardiogenic shock (HCC) 03/01/2023   COVID-19 virus infection 02/18/2023   Heart failure (HCC) 02/14/2023   AKI (acute kidney injury) (HCC) 02/13/2023   Acute  exacerbation of CHF (congestive heart failure) (HCC) 02/10/2023   Non-ST elevation (NSTEMI) myocardial infarction (HCC) 02/10/2023   Thickening of wall of gallbladder 02/10/2023   Elevated LFTs 02/10/2023   Chronic diastolic congestive heart failure (HCC) 02/05/2023   Pleural effusion 10/19/2022   Elevated brain natriuretic peptide (BNP) level 10/19/2022   Dyspnea on exertion 10/12/2022   Lower extremity edema 10/12/2022   Elevated blood pressure reading 06/21/2022   Callus 06/21/2022   Umbilical hernia without obstruction and without gangrene 06/21/2022   Impacted cerumen of right ear 05/26/2021   Chronic pain of both knees 05/26/2021   Preventative health care 05/26/2021   Screening for colon cancer 05/26/2021   PCP:  Walter Emms, NP Pharmacy:   [facility] [phone] [address] [address], [place] - [address] [place] [address] Phone: [phone] Fax: [phone]  [facility] [address] [place] [address] Phone: [phone] Fax: [phone]     Social Drivers of Health (SDOH) Social History: SDOH Screenings   Food Insecurity: No Food Insecurity (04/26/2023)  Housing: Low Risk  (04/26/2023)  Transportation Needs: No Transportation Needs (04/26/2023)  Utilities: Not At Risk (04/26/2023)  Depression (PHQ2-9): Low Risk  (10/19/2022)  Social Connections: Patient Unable To Answer (04/26/2023)  Tobacco Use: Medium Risk (04/25/2023)   SDOH Interventions:     Readmission Risk Interventions    03/20/2023   12:12 PM  Readmission Risk Prevention Plan  Transportation Screening Complete  Medication Review Oceanographer) Complete  HRI or Home Care Consult Complete  SW Recovery Care/Counseling Consult Complete  Palliative Care Screening Not Applicable  Skilled Nursing Facility Not Applicable

## 2023-04-30 NOTE — TOC Progression Note (Signed)
 Transition of Care [facility]) - Progression Note    Patient Details  Name: Walter Vargas MRN: [id number] Date of Birth: 21-Mar-1945  Transition of Care [facility]) CM/SW Contact  Eduard Roux, [place] Phone Number: 04/30/2023, 11:28 AM  CSW left voice message for patient's son to return call.  Antony Blackbird, MSW, LCSW Clinical Social Worker    Clinical Narrative:            Expected Discharge Plan and Services                                               Social Determinants of Health (SDOH) Interventions SDOH Screenings   Food Insecurity: No Food Insecurity (04/26/2023)  Housing: Low Risk  (04/26/2023)  Transportation Needs: No Transportation Needs (04/26/2023)  Utilities: Not At Risk (04/26/2023)  Depression (PHQ2-9): Low Risk  (10/19/2022)  Social Connections: Patient Unable To Answer (04/26/2023)  Tobacco Use: Medium Risk (04/25/2023)    Readmission Risk Interventions    03/20/2023   12:12 PM  Readmission Risk Prevention Plan  Transportation Screening Complete  Medication Review (RN Care Manager) Complete  HRI or Home Care Consult Complete  SW Recovery Care/Counseling Consult Complete  Palliative Care Screening Not Applicable  Skilled Nursing Facility Not Applicable

## 2023-04-30 NOTE — [place] FL2 (Signed)
 [place] MEDICAID FL2 LEVEL OF CARE FORM     IDENTIFICATION  Patient Name: Walter Vargas Birthdate: 1945-11-04 Sex: male Admission Date (Current Location): 04/25/2023  [facility] and [place] Number:  Producer, television/film/video and Address:  [facility] [place], 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, without accompanying behavioral or psychological disturbance (HCC) 04/04/2023   Pressure injury of skin 03/20/2023   Debility 03/20/2023   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   Postoperative shock, cardiogenic (HCC) 03/09/2023   Protein-calorie malnutrition, severe 03/03/2023   Cardiogenic shock (HCC) 03/01/2023   COVID-19 virus infection 02/18/2023   Heart failure (HCC) 02/14/2023   AKI (acute kidney injury) (HCC) 02/13/2023   Acute exacerbation of CHF (congestive heart failure) (HCC) 02/10/2023   Non-ST elevation (NSTEMI) myocardial infarction (HCC) 02/10/2023   Thickening of wall of gallbladder 02/10/2023   Elevated LFTs 02/10/2023   Chronic diastolic congestive heart failure (HCC) 02/05/2023   Pleural effusion 10/19/2022   Elevated brain natriuretic peptide (BNP) level 10/19/2022   Dyspnea on exertion 10/12/2022   Lower extremity edema 10/12/2022   Elevated blood pressure reading 06/21/2022   Callus 06/21/2022   Umbilical hernia without obstruction and without gangrene 06/21/2022   Impacted cerumen of  right ear 05/26/2021   Chronic pain of both knees 05/26/2021   Preventative health care 05/26/2021   Screening for colon cancer 05/26/2021    Orientation RESPIRATION BLADDER Height & Weight     Self, Situation, Place  Normal Continent Weight: 112 lb 7 oz (51 kg) Height:  5\' 8"  (172.7 cm)  BEHAVIORAL SYMPTOMS/MOOD NEUROLOGICAL BOWEL NUTRITION STATUS      Continent Diet  AMBULATORY STATUS COMMUNICATION OF NEEDS Skin   Limited Assist Verbally Other (Comment) (pressure injury mid lower stage II, closed incision left neck, wound incision skin tear arm, left lower posterior)                       Personal Care Assistance Level of Assistance  Bathing, Feeding, Dressing Bathing Assistance: Limited assistance Feeding assistance: Independent Dressing Assistance: Limited assistance     Functional Limitations Info  Sight, Hearing, Speech Sight Info: Impaired Hearing Info: Impaired Speech Info: Adequate    SPECIAL CARE FACTORS FREQUENCY  PT (By licensed PT), OT (By licensed OT)     PT Frequency: 5x per wek OT Frequency: 5x per week            Contractures Contractures Info: Not present    Additional Factors Info  Code Status, Allergies Code Status Info: FULL Allergies Info: NKA           Current Medications (04/30/2023):  This is the current hospital active medication list Current Facility-Administered Medications  Medication Dose Route Frequency Provider Last Rate Last Admin   0.9 %  sodium chloride infusion  500 mL Intravenous Once PRN Baglia, Corrina, PA-C  acetaminophen (TYLENOL) tablet 325-650 mg  325-650 mg Oral Q4H PRN Baglia, Corrina, PA-C       Or   acetaminophen (TYLENOL) suppository 325-650 mg  325-650 mg Rectal Q4H PRN Baglia, Corrina, PA-C       alum & mag hydroxide-simeth (MAALOX/MYLANTA) 200-200-20 MG/5ML suspension 15-30 mL  15-30 mL Oral Q2H PRN Baglia, Corrina, PA-C       amiodarone (PACERONE) tablet 200 mg  200 mg Oral Daily Baglia, Corrina,  PA-C   200 mg at 04/30/23 1610   apixaban (ELIQUIS) tablet 5 mg  5 mg Oral BID Pham, Minh Q, RPH-CPP   5 mg at 04/30/23 9604   aspirin chewable tablet 81 mg  81 mg Oral Daily Baglia, Corrina, PA-C   81 mg at 04/30/23 5409   atorvastatin (LIPITOR) tablet 80 mg  80 mg Oral Daily Baglia, Corrina, PA-C   80 mg at 04/30/23 8119   bisacodyl (DULCOLAX) EC tablet 5 mg  5 mg Oral Daily PRN Baglia, Corrina, PA-C       digoxin (LANOXIN) tablet 0.125 mg  0.125 mg Oral Daily Baglia, Corrina, PA-C   0.125 mg at 04/30/23 1478   docusate (COLACE) 50 MG/5ML liquid 100 mg  100 mg Oral BID Baglia, Corrina, PA-C   100 mg at 04/30/23 2956   guaiFENesin-dextromethorphan (ROBITUSSIN DM) 100-10 MG/5ML syrup 15 mL  15 mL Oral Q4H PRN Baglia, Corrina, PA-C       hydrALAZINE (APRESOLINE) injection 5 mg  5 mg Intravenous Q20 Min PRN Baglia, Corrina, PA-C       HYDROmorphone (DILAUDID) injection 0.5-1 mg  0.5-1 mg Intravenous Q2H PRN Baglia, Corrina, PA-C       hydrOXYzine (ATARAX) tablet 25 mg  25 mg Oral TID PRN Baglia, Corrina, PA-C       labetalol (NORMODYNE) injection 10 mg  10 mg Intravenous Q10 min PRN Baglia, Corrina, PA-C       LORazepam (ATIVAN) injection 0.5 mg  0.5 mg Intravenous Q4H PRN Schuh, McKenzi P, PA-C   0.5 mg at 04/27/23 0003   magnesium sulfate IVPB 2 g 50 mL  2 g Intravenous Daily PRN Baglia, Corrina, PA-C       metoprolol tartrate (LOPRESSOR) injection 2-5 mg  2-5 mg Intravenous Q2H PRN Baglia, Corrina, PA-C       mupirocin ointment (BACTROBAN) 2 % 1 Application  1 Application Nasal BID Leonie Douglas, MD   1 Application at 04/30/23 0833   ondansetron (ZOFRAN) injection 4 mg  4 mg Intravenous Q6H PRN Baglia, Corrina, PA-C       oxyCODONE (Oxy IR/ROXICODONE) immediate release tablet 5-10 mg  5-10 mg Oral Q4H PRN Baglia, Corrina, PA-C       pantoprazole (PROTONIX) EC tablet 40 mg  40 mg Oral BID AC Baglia, Corrina, PA-C   40 mg at 04/30/23 0833   phenol (CHLORASEPTIC) mouth spray 1 spray  1 spray  Mouth/Throat PRN Baglia, Corrina, PA-C       senna-docusate (Senokot-S) tablet 1 tablet  1 tablet Oral QHS PRN Baglia, Corrina, PA-C       sodium chloride flush (NS) 0.9 % injection 3 mL  3 mL Intravenous Q12H Baglia, Corrina, PA-C   3 mL at 04/30/23 0834   sodium chloride flush (NS) 0.9 % injection 3 mL  3 mL Intravenous PRN Graceann Congress, PA-C         Discharge Medications: Please see discharge summary for a list of discharge medications.  Relevant Imaging Results:  Relevant Lab  Results:   Additional Information SSN [id number]  Eduard Roux, LCSW

## 2023-04-30 NOTE — Progress Notes (Signed)
  Progress Note    04/30/2023 4:13 PM 5 Days Post-Op  RN called because patient fell when getting up out of chair to return to the bed. Patient states he slipped on his socks. Patient did not have LOC, did not hit his head. He remains neurologically intact. Left neck incision intact,fullness present and ecchymosis unchanged. He did have small skin tear on left leg as well as left elbow. These have been dressed. Dressings c/d/I. VSS. Patient left in bed in NAD.    Graceann Congress, PA-C [facility] [phone] 04/30/2023 4:13 PM

## 2023-04-30 NOTE — Progress Notes (Signed)
 Physical Therapy Treatment Patient Details Name: Walter Vargas MRN: [id number] DOB: [birth year] Today's Date: 04/30/2023   History of Present Illness 78 y.o. male [facility] from [facility] w/ agitation, confusion, and cognitive decline s/p left Carotid Endarterectomy. 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  Recent admit where pt had an NSTEMI requiring impella and underwent CABG. PMHx: HTN, HFrEF, mitral regurgiation, CABG (12/24)    PT Comments  Pt required min assist bed mobility, min assist transfers, and min assist amb 5' with RW. Pt declining farther gait distance, no reason specified. Pt in recliner with feet elevated at end of session.    If plan is discharge home, recommend the following: A little help with walking and/or transfers;A little help with bathing/dressing/bathroom;Assistance with cooking/housework;Assist for transportation;Help with stairs or ramp for entrance;Supervision due to cognitive status   Can travel by private vehicle     Yes  Equipment Recommendations  Rolling walker (2 wheels);BSC/3in1    Recommendations for Other Services       Precautions / Restrictions Precautions Precautions: Fall;Other (comment) Precaution/Restrictions Comments: impulsive, sternal precautions CABG (12/24)     Mobility  Bed Mobility Overal bed mobility: Needs Assistance Bed Mobility: Supine to Sit     Supine to sit: Min assist     General bed mobility comments: cues for sequencing    Transfers Overall transfer level: Needs assistance Equipment used: Rolling walker (2 wheels) Transfers: Sit to/from Stand Sit to Stand: Min assist           General transfer comment: assist to power up, cues for sequencing/safety    Ambulation/Gait Ambulation/Gait assistance: Contact guard assist Gait Distance (Feet): 5 Feet Assistive device: Rolling walker (2 wheels) Gait Pattern/deviations: Step-through pattern, Decreased stride  length, Narrow base of support, Trunk flexed       General Gait Details: amb bed to recliner. Pt declining farther distance   Stairs             Wheelchair Mobility     Tilt Bed    Modified Rankin (Stroke Patients Only)       Balance Overall balance assessment: Needs assistance Sitting-balance support: No upper extremity supported, Feet supported Sitting balance-Leahy Scale: Fair     Standing balance support: Bilateral upper extremity supported, During functional activity, Reliant on assistive device for balance Standing balance-Leahy Scale: Poor                              Communication Communication Communication: No apparent difficulties  Cognition Arousal: Alert Behavior During Therapy: Impulsive   PT - Cognitive impairments: No family/caregiver present to determine baseline, Orientation, Memory, Initiation, Problem solving, Sequencing, Safety/Judgement   Orientation impairments: Time, Situation                     Following commands: Impaired Following commands impaired: Follows one step commands with increased time    Cueing Cueing Techniques: Verbal cues, Tactile cues  Exercises      General Comments General comments (skin integrity, edema, etc.): VSS on RA      Pertinent Vitals/Pain Pain Assessment Pain Assessment: No/denies pain    Home Living                          Prior Function            PT Goals (current  goals can now be found in the care plan section) Acute Rehab PT Goals Patient Stated Goal: home Progress towards PT goals: Progressing toward goals    Frequency    Min 1X/week      PT Plan      Co-evaluation              AM-PAC PT "6 Clicks" Mobility   Outcome Measure  Help needed turning from your back to your side while in a flat bed without using bedrails?: A Little Help needed moving from lying on your back to sitting on the side of a flat bed without using bedrails?: A  Little Help needed moving to and from a bed to a chair (including a wheelchair)?: A Little Help needed standing up from a chair using your arms (e.g., wheelchair or bedside chair)?: A Little Help needed to walk in hospital room?: A Little Help needed climbing 3-5 steps with a railing? : A Lot 6 Click Score: 17    End of Session Equipment Utilized During Treatment: Gait belt Activity Tolerance: Patient tolerated treatment well Patient left: in chair;with call bell/phone within reach;with chair alarm set Nurse Communication: Mobility status PT Visit Diagnosis: Other abnormalities of gait and mobility (R26.89);Muscle weakness (generalized) (M62.81)     Time: 4098-1191 PT Time Calculation (min) (ACUTE ONLY): 24 min  Charges:    $Gait Training: 8-22 mins $Therapeutic Activity: 8-22 mins PT General Charges $$ ACUTE PT VISIT: 1 Visit                     Ferd Glassing., PT  Office # [phone]    Ilda Foil 04/30/2023, 11:44 AM

## 2023-05-01 LAB — CBC
HCT: 32 % — ABNORMAL LOW (ref 39.0–52.0)
Hemoglobin: 10.2 g/dL — ABNORMAL LOW (ref 13.0–17.0)
MCH: 28.9 pg (ref 26.0–34.0)
MCHC: 31.9 g/dL (ref 30.0–36.0)
MCV: 90.7 fL (ref 80.0–100.0)
Platelets: 259 10*3/uL (ref 150–400)
RBC: 3.53 MIL/uL — ABNORMAL LOW (ref 4.22–5.81)
RDW: 19.1 % — ABNORMAL HIGH (ref 11.5–15.5)
WBC: 5.7 10*3/uL (ref 4.0–10.5)
nRBC: 0 % (ref 0.0–0.2)

## 2023-05-01 NOTE — Progress Notes (Signed)
  Progress Note    05/01/2023 6:43 AM 6 Days Post-Op  Subjective:  says the socks were slippery and that is why he fell.   He wants to get up to the chair in a little bit.  Denies trouble swallowing.   Afebrile HR 50's-60's NSR 110's-120's systolic 100% RA  Vitals:   04/30/23 2348 05/01/23 0400  BP: 118/60 (!) 122/56  Pulse: 62 (!) 59  Resp: 19 (!) 22  Temp: 98.1 F (36.7 C) 97.8 F (36.6 C)  SpO2: 99% 100%    Physical Exam: General:  no distress Lungs:  non labored Incisions:  clean and unchanged from yesterday Neuro:  intact    CBC    Component Value Date/Time   WBC 5.7 05/01/2023 0404   RBC 3.53 (L) 05/01/2023 0404   HGB 10.2 (L) 05/01/2023 0404   HCT 32.0 (L) 05/01/2023 0404   PLT 259 05/01/2023 0404   MCV 90.7 05/01/2023 0404   MCH 28.9 05/01/2023 0404   MCHC 31.9 05/01/2023 0404   RDW 19.1 (H) 05/01/2023 0404   LYMPHSABS 0.8 03/28/2023 0536   MONOABS 0.8 03/28/2023 0536   EOSABS 0.2 03/28/2023 0536   BASOSABS 0.1 03/28/2023 0536    BMET    Component Value Date/Time   NA 135 04/26/2023 0440   K 4.4 04/26/2023 0440   CL 102 04/26/2023 0440   CO2 25 04/26/2023 0440   GLUCOSE 132 (H) 04/26/2023 0440   BUN 26 (H) 04/26/2023 0440   CREATININE 0.78 04/26/2023 0440   CALCIUM 8.4 (L) 04/26/2023 0440   GFRNONAA >60 04/26/2023 0440    INR    Component Value Date/Time   INR 1.6 (H) 03/03/2023 0314     Intake/Output Summary (Last 24 hours) at 05/01/2023 0643 Last data filed at 04/30/2023 2353 Gross per 24 hour  Intake 1300 ml  Output 1200 ml  Net 100 ml      Assessment/Plan:  78 y.o. male is s/p:  Left CEA for symptomatic left carotid artery stenosis   6 Days Post-Op   -pt doing well this am.  Awaiting placement in SNF -incision unchanged with ecchymosis and mild fullness.  No difficulty swallowing. -pt with fall yesterday- did not have LOC.  Hgb stable.  -acute blood loss anemia-hgb continues to improve.  -DVT prophylaxis:   Eliquis   Doreatha Massed, PA-C [facility] [phone] 05/01/2023 6:43 AM

## 2023-05-01 NOTE — Progress Notes (Signed)
 Mobility Specialist Progress Note:    05/01/23 0930  Mobility  Activity Ambulated with assistance in hallway;Transferred from bed to chair  Level of Assistance Minimal assist, patient does 75% or more  Assistive Device Front wheel walker  Distance Ambulated (ft) 140 ft  RUE Weight Bearing Per Provider Order NWB  LUE Weight Bearing Per Provider Order NWB  Activity Response Tolerated well  Mobility Referral Yes  Mobility visit 1 Mobility  Mobility Specialist Start Time (ACUTE ONLY) 0930  Mobility Specialist Stop Time (ACUTE ONLY) 0940  Mobility Specialist Time Calculation (min) (ACUTE ONLY) 10 min   Pt received dangling EOB, eager for mobility session. Ambulated in hallway with RW, MinA to stand, CGA during ambulation. Pt reports feeling "urge to use bathroom." Attempted to turn around and return to room. Asked pt to clarify if they need to have BM or urinate, at that moment pt passed solid stool in hallway. RN called for assistance as we continued to return to room. Pt Passed another BM before being transferred to [facility] to pass the rest. RN at bedside to assist with peri care. Agreeable to sit up in chair after ambulation, alarm on, call bell in reach, legs elevated. Encouraged pt to call for assistance. Left with all needs met.   Feliciana Rossetti Mobility Specialist Please contact via Special educational needs teacher or  Rehab office at [phone]

## 2023-05-01 NOTE — TOC Progression Note (Addendum)
 Transition of Care [facility]) - Progression Note    Patient Details  Name: Walter Vargas MRN: [id number] Date of Birth: 22-Jun-1945  Transition of Care [facility]) CM/SW Contact  Marliss Coots, LCSW Phone Number: 05/01/2023, 1:25 PM  Clinical Narrative:     1:25 PM CSW attempted to call patient's son, [name], regaridng SNF options, but there was no response and a voicemail was left.  4:00 PM CSW called [facility] regarding SNF options. [name] requested CSW to email options. CSW Teaching laboratory technician ([email or web address]) patient's current SNF options and their Medicare ratings [facility], [facility], [place], [facility], [address], [employer], [employer], [employer]).   Expected Discharge Plan: Skilled Nursing Facility Barriers to Discharge: Insurance Authorization, SNF Pending bed offer  Expected Discharge Plan and Services In-house Referral: Clinical Social Work     Living arrangements for the past 2 months: Single Family Home                                       Social Determinants of Health (SDOH) Interventions SDOH Screenings   Food Insecurity: No Food Insecurity (04/26/2023)  Housing: Low Risk  (04/26/2023)  Transportation Needs: No Transportation Needs (04/26/2023)  Utilities: Not At Risk (04/26/2023)  Depression (PHQ2-9): Low Risk  (10/19/2022)  Social Connections: Patient Unable To Answer (04/26/2023)  Tobacco Use: Medium Risk (04/25/2023)    Readmission Risk Interventions    03/20/2023   12:12 PM  Readmission Risk Prevention Plan  Transportation Screening Complete  Medication Review Oceanographer) Complete  HRI or Home Care Consult Complete  SW Recovery Care/Counseling Consult Complete  Palliative Care Screening Not Applicable  Skilled Nursing Facility Not Applicable

## 2023-05-01 NOTE — Discharge Summary (Addendum)
 Discharge Summary     Walter Vargas 1945/10/22 78 y.o. male  [id number]  Admission Date: 04/25/2023  Discharge Date: 05/04/2023   Physician: Leonie Douglas, MD  Admission Diagnosis: Status post carotid endarterectomy [Z98.890]   HPI:   This is a 78 y.o. male with symptomatic left carotid artery stenosis. After careful discussion of risks, benefits, and alternatives the patient was offered left carotid endarterectomy. We specifically discussed risk of stroke, cranial nerve injury, and hematoma. The patient understood and wished to proceed.   Hospital Course:  The patient was admitted to the hospital and taken to the operating room on 04/25/2023 and underwent left carotid endarterectomy with bovine patch angioplasty    Findings: Severe stenosis with vulnerable plaque about bifurcation. Good technical result from endarterectomy. Brisk, normal doppler flow in all carotid branches. No technical problems identified on completion duplex. Patient awoke at neurologic baseline. Exam re-confirmed in PACU.  The pt tolerated the procedure well and was transported to the PACU in good condition.   By POD 1, the pt neuro status was in tact.  He was started back on heparin gtt.  He did have some mild ecchymosis around the incision.    The rest of his post operative course included working with PT/OT.  His incision did have a small hematoma and ecchymosis.  He did not have any trouble swallowing.   Later in the day on POD 5, he did have a fall without LOC and did not hit his head.  He had a small skin tear on the left leg and left elbow.  He contributed this to his socks.   POD 7, left neck hematoma small and neck appears softer.  He walked with mobility yesterday.  Awaiting SNF placement.     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, without accompanying behavioral or psychological disturbance (HCC) 04/04/2023   Pressure injury of skin 03/20/2023   Debility 03/20/2023   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   Postoperative shock, cardiogenic (HCC) 03/09/2023   Protein-calorie malnutrition, severe 03/03/2023   Cardiogenic shock (HCC) 03/01/2023   COVID-19 virus infection 02/18/2023   Heart failure (HCC) 02/14/2023   AKI (acute kidney injury) (HCC) 02/13/2023   Acute exacerbation of CHF (congestive heart failure) (HCC) 02/10/2023   Non-ST elevation (NSTEMI) myocardial infarction (HCC) 02/10/2023   Thickening of wall of gallbladder 02/10/2023   Elevated LFTs 02/10/2023   Chronic diastolic congestive heart failure (HCC) 02/05/2023   Pleural effusion 10/19/2022   Elevated brain natriuretic peptide (BNP) level 10/19/2022   Dyspnea on exertion 10/12/2022   Lower extremity edema 10/12/2022   Elevated blood pressure reading 06/21/2022   Callus 06/21/2022   Umbilical hernia without obstruction and without gangrene 06/21/2022   Impacted cerumen of right ear 05/26/2021   Chronic pain of both knees 05/26/2021   Preventative health care  05/26/2021   Screening for colon cancer 05/26/2021   Past Medical History:  Diagnosis Date   Essential hypertension    HFrEF (heart failure with reduced ejection fraction) (HCC)    Mitral regurgitation     Allergies as of 05/02/2023   No Active Allergies      Medication List     STOP taking these medications    heparin 52841 UT/250ML  infusion       TAKE these medications    amiodarone 200 MG tablet Commonly known as: PACERONE Take 1 tablet (200 mg total) by mouth daily.   apixaban 5 MG Tabs tablet Commonly known as: ELIQUIS Take 1 tablet (5 mg total) by mouth 2 (two) times daily.   ascorbic acid 500 MG tablet Commonly known as: VITAMIN C Take 1 tablet (500 mg total) by mouth daily.   aspirin 81 MG chewable tablet Chew 1 tablet (81 mg total) by mouth daily.   atorvastatin 80 MG tablet Commonly known as: LIPITOR Take 1 tablet (80 mg total) by mouth daily.   digoxin 0.125 MG tablet Commonly known as: LANOXIN Take 1 tablet (0.125 mg total) by mouth daily.   docusate 50 MG/5ML liquid Commonly known as: COLACE Take 10 mLs (100 mg total) by mouth 2 (two) times daily.   hydrOXYzine 25 MG tablet Commonly known as: ATARAX Take 1 tablet (25 mg total) by mouth 3 (three) times daily as needed for anxiety.   Multivitamin Men 50+ Tabs Take 1 tablet by mouth daily.   pantoprazole 40 MG tablet Commonly known as: PROTONIX Take 1 tablet (40 mg total) by mouth 2 (two) times daily before a meal.         [facility] Discharge Instructions Carotid Endarterectomy (CEA)  Please refer to the following instructions for your post-procedure care. Your surgeon or physician assistant will discuss any changes with you.  Activity  You are encouraged to walk as much as you can. You can slowly return to normal activities but must avoid strenuous activity and heavy lifting until your doctor tell you it's OK. Avoid activities such as vacuuming or swinging a golf club. You can drive after one week if you are comfortable and you are no longer taking prescription pain medications. It is normal to feel tired for serval weeks after your surgery. It is also normal to have difficulty with sleep habits, eating, and bowel movements after surgery. These will go away with time.  Bathing/Showering  You  may shower after you come home. Do not soak in a bathtub, hot tub, or swim until the incision heals completely.  Incision Care  Shower every day. Clean your incision with mild soap and water. Pat the area dry with a clean towel. You do not need a bandage unless otherwise instructed. Do not apply any ointments or creams to your incision. You may have skin glue on your incision. Do not peel it off. It will come off on its own in about one week. Your incision may feel thickened and raised for several weeks after your surgery. This is normal and the skin will soften over time. For Men Only: It's OK to shave around the incision but do not shave the incision itself for 2 weeks. It is common to have numbness under your chin that could last for several months.  Diet  Resume your normal diet. There are no special food restrictions following this procedure. A low fat/low cholesterol diet is recommended for all patients with  vascular disease. In order to heal from your surgery, it is CRITICAL to get adequate nutrition. Your body requires vitamins, minerals, and protein. Vegetables are the best source of vitamins and minerals. Vegetables also provide the perfect balance of protein. Processed food has little nutritional value, so try to avoid this.  Medications  Resume taking all of your medications unless your doctor or physician assistant tells you not to.  If your incision is causing pain, you may take over-the- counter pain relievers such as acetaminophen (Tylenol). If you were prescribed a stronger pain medication, please be aware these medications can cause nausea and constipation.  Prevent nausea by taking the medication with a snack or meal. Avoid constipation by drinking plenty of fluids and eating foods with a high amount of fiber, such as fruits, vegetables, and grains.  Do not take Tylenol if you are taking prescription pain medications.  Follow Up  Our office will schedule a follow up appointment  2-3 weeks following discharge.  Please call us immediately for any of the following conditions  Increased pain, redness, drainage (pus) from your incision site. Fever of 101 degrees or higher. If you should develop stroke (slurred speech, difficulty swallowing, weakness on one side of your body, loss of vision) you should call 911 and go to the nearest emergency room.  Reduce your risk of vascular disease:  Stop smoking. If you would like help call [facility] at 1-800-QUIT-NOW [phone]) 484-7137) or [place] at [phone]. Manage your cholesterol Maintain a desired weight Control your diabetes Keep your blood pressure down  If you have any questions, please call the office at [phone].  Information on my medicine - ELIQUIS (apixaban)  This medication education was reviewed with me or my healthcare representative as part of my discharge preparation.   Why was Eliquis prescribed for you? Eliquis was prescribed for you to reduce the risk of forming blood clots that can cause a stroke if you have a medical condition called atrial fibrillation (a type of irregular heartbeat) OR to reduce the risk of a blood clots forming after orthopedic surgery.  What do You need to know about Eliquis ? Take your Eliquis TWICE DAILY - one tablet in the morning and one tablet in the evening with or without food.  It would be best to take the doses about the same time each day.  If you have difficulty swallowing the tablet whole please discuss with your pharmacist how to take the medication safely.  Take Eliquis exactly as prescribed by your doctor and DO NOT stop taking Eliquis without talking to the doctor who prescribed the medication.  Stopping may increase your risk of developing a new clot or stroke.  Refill your prescription before you run out.  After discharge, you should have regular check-up appointments with your healthcare provider that is prescribing your Eliquis.  In the future  your dose may need to be changed if your kidney function or weight changes by a significant amount or as you get older.  What do you do if you miss a dose? If you miss a dose, take it as soon as you remember on the same day and resume taking twice daily.  Do not take more than one dose of ELIQUIS at the same time.  Important Safety Information A possible side effect of Eliquis is bleeding. You should call your healthcare provider right away if you experience any of the following: Bleeding from an injury or your nose that does not stop. Unusual colored urine (  red or dark brown) or unusual colored stools (red or black). Unusual bruising for unknown reasons. A serious fall or if you hit your head (even if there is no bleeding).  Some medicines may interact with Eliquis and might increase your risk of bleeding or clotting while on Eliquis. To help avoid this, consult your healthcare provider or pharmacist prior to using any new prescription or non-prescription medications, including herbals, vitamins, non-steroidal anti-inflammatory drugs (NSAIDs) and supplements.  This website has more information on Eliquis (apixaban): http://[email or web address].com/eliquis/home  New medications:  Eliquis 5mg  bid    Disposition: SNF  Patient's condition: is Good  Follow up: 1. [facility] in 2 weeks for incision check on Dr. Lenell Antu clinic day   Doreatha Massed, PA-C [facility] [phone]   --- For [facility] Registry use ---   Modified Rankin score at D/C (0-6): 0  IV medication needed for:  1. Hypertension: No 2. Hypotension: No  Post-op Complications: No  1. Post-op CVA or TIA: No  If yes: Event classification (right eye, left eye, right cortical, left cortical, verterobasilar, other): n/a  If yes: Timing of event (intra-op, <6 hrs post-op, >=6 hrs post-op, unknown): n/a  2. CN injury: No  If yes: CN n/a injuried   3. Myocardial infarction: No  If yes: Dx by (EKG or clinical,  Troponin): n/a  4.  CHF: No  5.  Dysrhythmia (new): No  6. Wound infection: No  7. Reperfusion symptoms: No  8. Return to OR: No  If yes: return to OR for (bleeding, neurologic, other CEA incision, other): n/a  Discharge medications: Statin use:  Yes ASA use:  Yes   Beta blocker use:  No ACE-Inhibitor use:  No  ARB use:  No CCB use: No P2Y12 Antagonist use: No, [ ]  Plavix, [ ]  Plasugrel, [ ]  Ticlopinine, [ ]  Ticagrelor, [ ]  Other, [ ]  No for medical reason, [ ]  Non-compliant, [ ]  Not-indicated Anti-coagulant use:  Yes, [ ]  Warfarin, [ ]  Rivaroxaban, [ ]  Dabigatran,  Eliquis

## 2023-05-02 LAB — CBC
HCT: 31.4 % — ABNORMAL LOW (ref 39.0–52.0)
Hemoglobin: 9.7 g/dL — ABNORMAL LOW (ref 13.0–17.0)
MCH: 28.4 pg (ref 26.0–34.0)
MCHC: 30.9 g/dL (ref 30.0–36.0)
MCV: 92.1 fL (ref 80.0–100.0)
Platelets: 275 10*3/uL (ref 150–400)
RBC: 3.41 MIL/uL — ABNORMAL LOW (ref 4.22–5.81)
RDW: 19.1 % — ABNORMAL HIGH (ref 11.5–15.5)
WBC: 5.2 10*3/uL (ref 4.0–10.5)
nRBC: 0 % (ref 0.0–0.2)

## 2023-05-02 MED ORDER — APIXABAN 5 MG PO TABS: 5.0000 mg | ORAL_TABLET | Freq: Two times a day (BID) | ORAL | Status: DC

## 2023-05-02 NOTE — Progress Notes (Signed)
 Occupational Therapy Treatment Patient Details Name: Walter Vargas MRN: [id number] DOB: February 04, 1946 Today's Date: 05/02/2023   History of present illness 78 y.o. male presents to [facility] 04/25/23 from [facility] w/ agitation, confusion, and cognitive decline s/p left Carotid Endarterectomy. 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  Recent admit where pt had an NSTEMI requiring impella and underwent CABG. PMHx: HTN, HFrEF, mitral regurgiation, CABG (12/24)   OT comments  Pt making slow but steady progress to set adl goals of supervision.  Pt continues to be most limited by cognition in areas of safety and insight and decreased balance with a posterior lean in sitting and when first coming to a standing position.  Feel pt will need <3 hours a day of rehab prior to going home and may need someone with him 24/7 at d/c.  Will continue to focus on safety with adls in standing and pt's cognition.      If plan is discharge home, recommend the following:  A little help with walking and/or transfers;A little help with bathing/dressing/bathroom;Assistance with cooking/housework;Assist for transportation;Help with stairs or ramp for entrance;Direct supervision/assist for financial management;Direct supervision/assist for medications management   Equipment Recommendations       Recommendations for Other Services      Precautions / Restrictions Precautions Precautions: Fall Recall of Precautions/Restrictions: Impaired Precaution/Restrictions Comments: impulsive, sternal precautions CABG (12/24) Restrictions Weight Bearing Restrictions Per Provider Order: No RUE Weight Bearing Per Provider Order: Weight bearing as tolerated LUE Weight Bearing Per Provider Order: Weight bearing as tolerated Other Position/Activity Restrictions: sternal precautions       Mobility Bed Mobility Overal bed mobility: Needs Assistance Bed Mobility: Supine to Sit     Supine to sit: Contact  guard     General bed mobility comments: cues to use bedrail instead of pulling on therapist    Transfers Overall transfer level: Needs assistance Equipment used: Rolling walker (2 wheels) Transfers: Sit to/from Stand, Bed to chair/wheelchair/BSC Sit to Stand: Min assist     Step pivot transfers: Min assist     General transfer comment: assist to power up, cues for sequencing/safety     Balance Overall balance assessment: Needs assistance Sitting-balance support: No upper extremity supported, Feet supported Sitting balance-Leahy Scale: Fair Sitting balance - Comments: while donning sock at EOB Postural control: Posterior lean Standing balance support: Bilateral upper extremity supported, During functional activity, Reliant on assistive device for balance Standing balance-Leahy Scale: Poor Standing balance comment: posterior lean, Needs UE support                           ADL either performed or assessed with clinical judgement   ADL Overall ADL's : Needs assistance/impaired Eating/Feeding: Independent;Sitting   Grooming: Independent;Sitting   Upper Body Bathing: Set up;Supervision/ safety;Sitting   Lower Body Bathing: Minimal assistance;Sit to/from stand       Lower Body Dressing: Minimal assistance Lower Body Dressing Details (indicate cue type and reason): sit to stand only Toilet Transfer: Minimal assistance;Ambulation;Rolling walker (2 wheels) Toilet Transfer Details (indicate cue type and reason): Pt fell posteriorly onto bed on first attempt standing.  Pt had difficulty motor planning where to put hands to stand succesfully. Toileting- Clothing Manipulation and Hygiene: Minimal assistance;Sit to/from stand;Cueing for compensatory techniques Toileting - Clothing Manipulation Details (indicate cue type and reason): assist to keep balance in standing while cleaning self.     Functional mobility during ADLs:  Minimal assistance;Rolling walker (2  wheels) General ADL Comments: Pt most limited by not wanting do attempt more adls and do any ambulation outside of room.  Pt is unsafe and impulsive with mobility requring someone to be with him at all times.    Extremity/Trunk Assessment Upper Extremity Assessment Upper Extremity Assessment: Overall WFL for tasks assessed (within limites of sternal precautions)   Lower Extremity Assessment Lower Extremity Assessment: Defer to PT evaluation        Vision   Vision Assessment?:  (wears glasses at alll times. Does not see well without them but doesn't like wearing them.)   Perception Perception Perception: Within Functional Limits   Praxis Praxis Praxis: Impaired Praxis Impairment Details: Motor planning   Communication Communication Communication: No apparent difficulties   Cognition Arousal: Alert Behavior During Therapy: Impulsive Cognition: Cognition impaired   Orientation impairments: Time Awareness: Intellectual awareness impaired Memory impairment (select all impairments): Declarative long-term memory Attention impairment (select first level of impairment): Sustained attention Executive functioning impairment (select all impairments): Reasoning, Problem solving OT - Cognition Comments: Pt continues to think it is march 2024. Pt has difficulty problem solving how to get out of bed with bedrail up but could not figure out to ask to put the bedrail down. Pt focused on eating his peanut butter. Difficult to redirect to other tasks.                 Following commands: Intact Following commands impaired: Follows one step commands with increased time      Cueing   Cueing Techniques: Verbal cues, Tactile cues  Exercises      Shoulder Instructions       General Comments Pt most limtied by decreased balance and impaired cognition in areas of safey and motor planning.    Pertinent Vitals/ Pain       Pain Assessment Pain Assessment: Faces Faces Pain Scale: Hurts  a little bit Pain Location: L hip Pain Descriptors / Indicators: Discomfort, Grimacing Pain Intervention(s): Monitored during session, Repositioned  Home Living                                          Prior Functioning/Environment              Frequency  Min 1X/week        Progress Toward Goals  OT Goals(current goals can now be found in the care plan section)  Progress towards OT goals: Progressing toward goals  Acute Rehab OT Goals Patient Stated Goal: to be able to live at home OT Goal Formulation: With patient Time For Goal Achievement: 05/11/23 Potential to Achieve Goals: Good ADL Goals Pt Will Perform Grooming: with supervision;standing Pt Will Perform Upper Body Bathing: with supervision;sitting;standing Pt Will Perform Lower Body Bathing: with supervision;sit to/from stand Pt Will Perform Upper Body Dressing: with supervision;sitting Pt Will Perform Lower Body Dressing: with supervision;sit to/from stand Pt Will Transfer to Toilet: with supervision;ambulating;regular height toilet Pt Will Perform Toileting - Clothing Manipulation and hygiene: with supervision;sit to/from stand Additional ADL Goal #1: Pt will be able to path find with minimal cues  Plan      Co-evaluation                 AM-PAC OT "6 Clicks" Daily Activity     Outcome Measure   Help from another person eating meals?: None Help from another person  taking care of personal grooming?: None Help from another person toileting, which includes using toliet, bedpan, or urinal?: A Little Help from another person bathing (including washing, rinsing, drying)?: A Little Help from another person to put on and taking off regular upper body clothing?: A Little Help from another person to put on and taking off regular lower body clothing?: A Little 6 Click Score: 20    End of Session Equipment Utilized During Treatment: Rolling walker (2 wheels)  OT Visit Diagnosis:  Unsteadiness on feet (R26.81);Other abnormalities of gait and mobility (R26.89);Muscle weakness (generalized) (M62.81);Other symptoms and signs involving cognitive function   Activity Tolerance Patient tolerated treatment well   Patient Left in chair;with call bell/phone within reach;with chair alarm set   Nurse Communication Mobility status        Time: 1610-9604 OT Time Calculation (min): 25 min  Charges: OT General Charges $OT Visit: 1 Visit OT Treatments $Self Care/Home Management : 23-37 mins   Hope Budds 05/02/2023, 11:06 AM

## 2023-05-02 NOTE — TOC Progression Note (Addendum)
 Transition of Care [facility]) - Progression Note    Patient Details  Name: Walter Vargas MRN: [id number] Date of Birth: 1945/08/14  Transition of [facility]) CM/SW Contact  Eduard Roux, [place] Phone Number: 05/02/2023, 12:12 PM  Clinical Narrative:     Left voice message for patient's  son to call CSW- waiting on response.   Placement barrier :  waiting on SNF choice from family  and insurance auth once SNF is chosen  Antony Blackbird, MSW, LCSW Clinical Social Worker    Expected Discharge Plan: Skilled Nursing Facility Barriers to Discharge: English as a second language teacher, SNF Pending bed offer  Expected Discharge Plan and Services In-house Referral: Clinical Social Work     Living arrangements for the past 2 months: Single Family Home Expected Discharge Date: 05/02/23                                     Social Determinants of Health (SDOH) Interventions SDOH Screenings   Food Insecurity: No Food Insecurity (04/26/2023)  Housing: Low Risk  (04/26/2023)  Transportation Needs: No Transportation Needs (04/26/2023)  Utilities: Not At Risk (04/26/2023)  Depression (PHQ2-9): Low Risk  (10/19/2022)  Social Connections: Patient Unable To Answer (04/26/2023)  Tobacco Use: Medium Risk (04/25/2023)    Readmission Risk Interventions    03/20/2023   12:12 PM  Readmission Risk Prevention Plan  Transportation Screening Complete  Medication Review [employer]) Complete  HRI or Home Care Consult Complete  SW Recovery Care/Counseling Consult Complete  Palliative Care Screening Not Applicable  Skilled Nursing Facility Not Applicable

## 2023-05-02 NOTE — TOC Progression Note (Signed)
 Transition of Care [facility]) - Progression Note    Patient Details  Name: Walter Vargas MRN: [id number] Date of Birth: 27-Feb-1946  Transition of Care [facility]) CM/SW Contact  Eduard Roux, [place] Phone Number: 05/02/2023, 2:13 PM  Clinical Narrative:     CSW called patient's son- left voice message patient is stable for d/c and needs SNF choice- waiting on call back   TOC will continue to follow and assist with discharge planning.  Antony Blackbird, MSW, LCSW Clinical Social Worker    Expected Discharge Plan: Skilled Nursing Facility Barriers to Discharge: Insurance Authorization, SNF Pending bed offer  Expected Discharge Plan and Services In-house Referral: Clinical Social Work     Living arrangements for the past 2 months: Single Family Home Expected Discharge Date: 05/02/23                                     Social Determinants of Health (SDOH) Interventions SDOH Screenings   Food Insecurity: No Food Insecurity (04/26/2023)  Housing: Low Risk  (04/26/2023)  Transportation Needs: No Transportation Needs (04/26/2023)  Utilities: Not At Risk (04/26/2023)  Depression (PHQ2-9): Low Risk  (10/19/2022)  Social Connections: Patient Unable To Answer (04/26/2023)  Tobacco Use: Medium Risk (04/25/2023)    Readmission Risk Interventions    03/20/2023   12:12 PM  Readmission Risk Prevention Plan  Transportation Screening Complete  Medication Review Oceanographer) Complete  HRI or Home Care Consult Complete  SW Recovery Care/Counseling Consult Complete  Palliative Care Screening Not Applicable  Skilled Nursing Facility Not Applicable

## 2023-05-02 NOTE — Progress Notes (Addendum)
  Progress Note    05/02/2023 6:35 AM 7 Days Post-Op  Subjective:  no complaints.  Denies trouble swallowing.  Moving all extremities equally  Afebrile HR 50's-60's 110's-120's systolic 99% RA  Vitals:   05/01/23 2335 05/02/23 0438  BP: (!) 113/55 (!) 114/58  Pulse: (!) 56 (!) 55  Resp: 14 15  Temp: 97.7 F (36.5 C) 97.6 F (36.4 C)  SpO2: 100% 99%    Physical Exam: General:  no distress Lungs:  non labored Incisions:  left neck incision with small hematoma and appears softer today.   Extremities:  moving all extremities   CBC    Component Value Date/Time   WBC 5.2 05/02/2023 0312   RBC 3.41 (L) 05/02/2023 0312   HGB 9.7 (L) 05/02/2023 0312   HCT 31.4 (L) 05/02/2023 0312   PLT 275 05/02/2023 0312   MCV 92.1 05/02/2023 0312   MCH 28.4 05/02/2023 0312   MCHC 30.9 05/02/2023 0312   RDW 19.1 (H) 05/02/2023 0312   LYMPHSABS 0.8 03/28/2023 0536   MONOABS 0.8 03/28/2023 0536   EOSABS 0.2 03/28/2023 0536   BASOSABS 0.1 03/28/2023 0536    BMET    Component Value Date/Time   NA 135 04/26/2023 0440   K 4.4 04/26/2023 0440   CL 102 04/26/2023 0440   CO2 25 04/26/2023 0440   GLUCOSE 132 (H) 04/26/2023 0440   BUN 26 (H) 04/26/2023 0440   CREATININE 0.78 04/26/2023 0440   CALCIUM 8.4 (L) 04/26/2023 0440   GFRNONAA >60 04/26/2023 0440    INR    Component Value Date/Time   INR 1.6 (H) 03/03/2023 0314     Intake/Output Summary (Last 24 hours) at 05/02/2023 9528 Last data filed at 05/01/2023 2040 Gross per 24 hour  Intake 580 ml  Output 850 ml  Net -270 ml      Assessment/Plan:  78 y.o. male is s/p:  Left CEA for symptomatic left carotid artery stenosis   7 Days Post-Op   -pt doing well.  Left neck hematoma small and appears softer today.   -TOC working on SNF-options presented to son and awaiting decision. -DVT prophylaxis:  Eliquis -has f/u appt on 05/16/2023   Doreatha Massed, PA-C [facility] [phone] 05/02/2023 6:35  AM  VASCULAR STAFF ADDENDUM: I have independently interviewed and examined the patient. I agree with the above.   Rande Brunt. Lenell Antu, MD [facility] Phone Number: [phone] 05/02/2023 1:28 PM

## 2023-05-03 LAB — CBC
HCT: 31.1 % — ABNORMAL LOW (ref 39.0–52.0)
Hemoglobin: 9.6 g/dL — ABNORMAL LOW (ref 13.0–17.0)
MCH: 27.9 pg (ref 26.0–34.0)
MCHC: 30.9 g/dL (ref 30.0–36.0)
MCV: 90.4 fL (ref 80.0–100.0)
Platelets: 286 10*3/uL (ref 150–400)
RBC: 3.44 MIL/uL — ABNORMAL LOW (ref 4.22–5.81)
RDW: 19.1 % — ABNORMAL HIGH (ref 11.5–15.5)
WBC: 6 10*3/uL (ref 4.0–10.5)
nRBC: 0 % (ref 0.0–0.2)

## 2023-05-03 NOTE — TOC Progression Note (Signed)
 Transition of Care [facility]) - Progression Note    Patient Details  Name: Walter Vargas MRN: [id number] Date of Birth: 1945-07-11  Transition of Care [facility]) CM/SW Contact  Eduard Roux, [place] Phone Number: 05/03/2023, 4:42 PM  Clinical Narrative:     Insurance authorization pending reference # [id number]  Antony Blackbird, MSW, LCSW Clinical Social Worker    Expected Discharge Plan: Skilled Nursing Facility Barriers to Discharge: Insurance Authorization  Expected Discharge Plan and Services In-house Referral: Clinical Social Work     Living arrangements for the past 2 months: Single Family Home Expected Discharge Date: 05/02/23                                     Social Determinants of Health (SDOH) Interventions SDOH Screenings   Food Insecurity: No Food Insecurity (04/26/2023)  Housing: Low Risk  (04/26/2023)  Transportation Needs: No Transportation Needs (04/26/2023)  Utilities: Not At Risk (04/26/2023)  Depression (PHQ2-9): Low Risk  (10/19/2022)  Social Connections: Patient Unable To Answer (04/26/2023)  Tobacco Use: Medium Risk (04/25/2023)    Readmission Risk Interventions    03/20/2023   12:12 PM  Readmission Risk Prevention Plan  Transportation Screening Complete  Medication Review Oceanographer) Complete  HRI or Home Care Consult Complete  SW Recovery Care/Counseling Consult Complete  Palliative Care Screening Not Applicable  Skilled Nursing Facility Not Applicable

## 2023-05-03 NOTE — Consult Note (Signed)
 [facility] Liaison Consult Note   05/03/2023  Walter Vargas 09/12/1945 [id number]  [facility] [[facility]] Consult patient with LLOS 8 days  Late entry 05/03/23 12;15 pm  [facility] Liaison met patient at bedside and resting on rounds, no family at bedside. Patient being recommended for SNF, awaiting updates from PT  Primary Care Provider:  [name], NP, with [facility] at [facility] which is listed to provide the transition of care follow up and TOC calls  Insurance: [employer] Complete  Patient was reviewed for readmission high risk score for unplanned readmission 8 -day length of stay to assess for barriers to care for transition.  Patient was screened for hospitalization and on behalf of [facility]  Care Coordination to assess for post hospital community care needs.  Patient is being considered for a skilled nursing facility level of care for post hospital transition.  If the patient goes to a [facility] affiliated facility then, patient can be followed by Jolayne Panther [facility] RN with traditional Medicare and approved Medicare Advantage plans.   Plan:  If transitions to affiliated facility, then will notify the Community [facility] RN can follow for any known or needs for transitional care needs for returning to post facility care coordination needs to return to community.  For questions or referrals, please contact:  Charlesetta Shanks, RN, BSN, CCM [place]  [facility], [facility] Liaison Direct Dial: [phone] or secure chat Email: [email or web address]

## 2023-05-03 NOTE — Progress Notes (Signed)
 Physical Therapy Treatment Patient Details Name: Walter Vargas MRN: [id number] DOB: Mar 29, 1945 Today's Date: 05/03/2023   History of Present Illness 78 y.o. male presents to [facility] 04/[facility]/23 from [facility] w/ agitation, confusion, and cognitive decline s/p left Carotid Endarterectomy. 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  Recent admit where pt had an NSTEMI requiring impella and underwent CABG. Hospital fall 2/17 w/ small skin tear on L LE and elbow. 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 Pt continues to required MinA for steadying assist when standing. When ambulating, pt needs MinA to assist with RW management as pt needs multiple cues to stay close to RW. Pt also needs cues to decrease gait speed due to lack of control with RW. Pt is progressing towards goals. Pt continues to benefit from <3hrs post acute rehab as pt is impulsive with mobility and needs 24/7 supervision for safety and assistance. Pt does not have 24/7 assist available at home. Acute PT to follow.      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) Restrictions Weight Bearing Restrictions Per Provider Order: No RUE Weight Bearing Per Provider Order: Weight bearing as tolerated LUE Weight Bearing Per Provider Order: Weight bearing as tolerated Other Position/Activity Restrictions:  sternal precautions     Mobility  Bed Mobility Overal bed mobility: Needs Assistance Bed Mobility: Supine to Sit, Sit to Supine     Supine to sit: Contact guard Sit to supine: Contact guard assist   General bed mobility comments: CGA for safety, cues for technique    Transfers Overall transfer level: Needs assistance Equipment used: Rolling walker (2 wheels) Transfers: Sit to/from Stand Sit to Stand: Min assist   Step pivot transfers: Min assist       General transfer comment: MinA for steadying, cues for sequencing and technique    Ambulation/Gait Ambulation/Gait assistance: Min assist Gait Distance (Feet): 100 Feet (x100, x100) Assistive device: Rolling walker (2 wheels) Gait Pattern/deviations: Step-through pattern, Decreased stride length, Narrow base of support, Trunk flexed       General Gait Details: MinA for RW management, pt has difficulty staying close to RW with cues to decrease gait speed. Impulsive with mobility    Balance Overall balance assessment: Needs assistance Sitting-balance support: No upper extremity supported, Feet supported Sitting balance-Leahy Scale: Fair     Standing balance support: Bilateral upper extremity supported, During functional activity, Reliant on assistive device for balance Standing balance-Leahy Scale: Poor Standing balance comment: reliant on UE support       Communication Communication Communication: No apparent difficulties  Cognition Arousal: Alert Behavior During Therapy: Impulsive   PT - Cognitive impairments: No family/caregiver present to determine baseline, Orientation, Memory, Initiation, Problem solving, Sequencing, Safety/Judgement    Following commands: Intact Following commands impaired: Follows one step commands with increased time    Cueing Cueing Techniques: Verbal cues, Tactile cues     General Comments General comments (skin integrity, edema, etc.): VSS on RA,  pt prefers to use sneakers instead  of socks for mobility      Pertinent Vitals/Pain Pain Assessment Pain Assessment: No/denies pain     PT Goals (current goals can now be found in the care plan section) Acute Rehab PT Goals PT Goal Formulation: With patient/family Time For Goal Achievement: 05/11/23 Potential to Achieve Goals: Good Progress towards PT goals: Progressing toward goals    Frequency    Min 1X/week       AM-PAC PT "6 Clicks" Mobility   Outcome Measure  Help needed turning from your back to your side while in a flat bed without using bedrails?: A Little Help needed moving from lying on your back to sitting on the side of a flat bed without using bedrails?: A Little Help needed moving to and from a bed to a chair (including a wheelchair)?: A Little Help needed standing up from a chair using your arms (e.g., wheelchair or bedside chair)?: A Little Help needed to walk in hospital room?: A Little Help needed climbing 3-5 steps with a railing? : A Lot 6 Click Score: 17    End of Session Equipment Utilized During Treatment: Gait belt Activity Tolerance: Patient tolerated treatment well Patient left: in bed;with call bell/phone within reach;with bed alarm set Nurse Communication: Mobility status PT Visit Diagnosis: Other abnormalities of gait and mobility (R26.89);Muscle weakness (generalized) (M62.81)     Time: 6962-9528 PT Time Calculation (min) (ACUTE ONLY): 24 min  Charges:    $Gait Training: 8-22 mins $Therapeutic Activity: 8-22 mins PT General Charges $$ ACUTE PT VISIT: 1 Visit                    Hilton Cork, PT, DPT Secure Chat Preferred  Rehab Office [phone]   Arturo Morton Brion Aliment 05/03/2023, 3:23 PM

## 2023-05-03 NOTE — TOC Progression Note (Signed)
 Transition of Care [facility]) - Progression Note    Patient Details  Name: Walter Vargas MRN: [id number] Date of Birth: [birth year]  Transition of Care [facility]) CM/SW Contact  Eduard Roux, [place] Phone Number: 05/03/2023, 2:07 PM  Clinical Narrative:     12:40 pm CSW received call back for [facility] - they confirmed bed availability- advised will start insurance auth   1:17 pm - spoke with patient's daughter informed [name] has availability- she states she has spoken with the patient and her brother, [name]- they all agree with the plan for short term rehab at [facility]. will start authorization for SNF  2:11 pm - sent message to PT- informed will need updated PT for authorization for SNF.   TOC will continue to follow and assist with discharge planning.  Antony Blackbird, MSW, LCSW Clinical Social Worker         Expected Discharge Plan: Skilled Nursing Facility Barriers to Discharge: Insurance Authorization, SNF Pending bed offer  Expected Discharge Plan and Services In-house Referral: Clinical Social Work     Living arrangements for the past 2 months: Single Family Home Expected Discharge Date: 05/02/23                                     Social Determinants of Health (SDOH) Interventions SDOH Screenings   Food Insecurity: No Food Insecurity (04/26/2023)  Housing: Low Risk  (04/26/2023)  Transportation Needs: No Transportation Needs (04/26/2023)  Utilities: Not At Risk (04/26/2023)  Depression (PHQ2-9): Low Risk  (10/19/2022)  Social Connections: Patient Unable To Answer (04/26/2023)  Tobacco Use: Medium Risk (04/25/2023)    Readmission Risk Interventions    03/20/2023   12:12 PM  Readmission Risk Prevention Plan  Transportation Screening Complete  Medication Review Oceanographer) Complete  HRI or Home Care Consult Complete  SW Recovery Care/Counseling Consult Complete  Palliative Care Screening Not Applicable  Skilled Nursing Facility Not Applicable

## 2023-05-03 NOTE — Progress Notes (Signed)
  Progress Note    05/03/2023 6:46 AM 8 Days Post-Op  Subjective:  no complaints.  RN reports he gave himself a bath last night.    Afebrile HR 60's-70's  110's-120's systolic 98% RA  Vitals:   05/02/23 2351 05/03/23 0503  BP: 123/63 (!) 118/56  Pulse: 65 60  Resp: 16 18  Temp: 98.2 F (36.8 C) 98.5 F (36.9 C)  SpO2: 100% 98%    Physical Exam: General:  no distress Lungs:  non labored Incisions:  clean and intact; hematoma stable    CBC    Component Value Date/Time   WBC 6.0 05/03/2023 0259   RBC 3.44 (L) 05/03/2023 0259   HGB 9.6 (L) 05/03/2023 0259   HCT 31.1 (L) 05/03/2023 0259   PLT 286 05/03/2023 0259   MCV 90.4 05/03/2023 0259   MCH 27.9 05/03/2023 0259   MCHC 30.9 05/03/2023 0259   RDW 19.1 (H) 05/03/2023 0259   LYMPHSABS 0.8 03/28/2023 0536   MONOABS 0.8 03/28/2023 0536   EOSABS 0.2 03/28/2023 0536   BASOSABS 0.1 03/28/2023 0536    BMET    Component Value Date/Time   NA 135 04/26/2023 0440   K 4.4 04/26/2023 0440   CL 102 04/26/2023 0440   CO2 25 04/26/2023 0440   GLUCOSE 132 (H) 04/26/2023 0440   BUN 26 (H) 04/26/2023 0440   CREATININE 0.78 04/26/2023 0440   CALCIUM 8.4 (L) 04/26/2023 0440   GFRNONAA >60 04/26/2023 0440    INR    Component Value Date/Time   INR 1.6 (H) 03/03/2023 0314     Intake/Output Summary (Last 24 hours) at 05/03/2023 0646 Last data filed at 05/03/2023 0503 Gross per 24 hour  Intake 240 ml  Output 400 ml  Net -160 ml      Assessment/Plan:  78 y.o. male is s/p:  Left CEA for symptomatic left carotid artery stenosis   8 Days Post-Op   -pt doing well and incision looks good with stable hematoma -awaiting SNF -DVT prophylaxis:  Eliquis -has f/u on 05/16/2023   Doreatha Massed, PA-C [facility] [phone] 05/03/2023 6:46 AM

## 2023-05-03 NOTE — TOC Progression Note (Signed)
 Transition of Care [facility]) - Progression Note    Patient Details  Name: Walter Vargas MRN: [id number] Date of Birth: 10-06-1945  Transition of Care [facility]) CM/SW Contact  Eduard Roux, [place] Phone Number: 05/03/2023, 12:24 PM  Clinical Narrative:     Called patient's son, [name]- left voice message to return call.   Called patient's daughter, [name]- informed has called patient's son but has not received call back- she explained he has been working but the family has decided on [facility] and [employer]. [name] states she has been in contact with [name] and will update him.   Contacted [facility]- left voice message to return call  to confirm availability.   TOC will continue to follow and assist with discharge planning.  Antony Blackbird, MSW, LCSW Clinical Social Worker     Expected Discharge Plan: Skilled Nursing Facility Barriers to Discharge: Insurance Authorization, SNF Pending bed offer  Expected Discharge Plan and Services In-house Referral: Clinical Social Work     Living arrangements for the past 2 months: Single Family Home Expected Discharge Date: 05/02/23                                     Social Determinants of Health (SDOH) Interventions SDOH Screenings   Food Insecurity: No Food Insecurity (04/26/2023)  Housing: Low Risk  (04/26/2023)  Transportation Needs: No Transportation Needs (04/26/2023)  Utilities: Not At Risk (04/26/2023)  Depression (PHQ2-9): Low Risk  (10/19/2022)  Social Connections: Patient Unable To Answer (04/26/2023)  Tobacco Use: Medium Risk (04/25/2023)    Readmission Risk Interventions    03/20/2023   12:12 PM  Readmission Risk Prevention Plan  Transportation Screening Complete  Medication Review Oceanographer) Complete  HRI or Home Care Consult Complete  SW Recovery Care/Counseling Consult Complete  Palliative Care Screening Not Applicable  Skilled Nursing Facility Not Applicable

## 2023-05-03 NOTE — Care Management Important Message (Signed)
 Important Message  Patient Details  Name: Walter Vargas MRN: [id number] Date of Birth: June 30, 1945   Important Message Given:  Yes - Medicare IM     Renie Ora 05/03/2023, 11:39 AM

## 2023-05-04 DIAGNOSIS — R451 Restlessness and agitation: Secondary | ICD-10-CM | POA: Diagnosis not present

## 2023-05-04 DIAGNOSIS — S51812A Laceration without foreign body of left forearm, initial encounter: Secondary | ICD-10-CM | POA: Diagnosis not present

## 2023-05-04 DIAGNOSIS — Z48812 Encounter for surgical aftercare following surgery on the circulatory system: Secondary | ICD-10-CM | POA: Diagnosis not present

## 2023-05-04 DIAGNOSIS — G8929 Other chronic pain: Secondary | ICD-10-CM | POA: Diagnosis not present

## 2023-05-04 DIAGNOSIS — R2681 Unsteadiness on feet: Secondary | ICD-10-CM | POA: Diagnosis not present

## 2023-05-04 DIAGNOSIS — Z743 Need for continuous supervision: Secondary | ICD-10-CM | POA: Diagnosis not present

## 2023-05-04 DIAGNOSIS — S50311A Abrasion of right elbow, initial encounter: Secondary | ICD-10-CM | POA: Diagnosis not present

## 2023-05-04 DIAGNOSIS — I63512 Cerebral infarction due to unspecified occlusion or stenosis of left middle cerebral artery: Secondary | ICD-10-CM | POA: Diagnosis not present

## 2023-05-04 DIAGNOSIS — L89626 Pressure-induced deep tissue damage of left heel: Secondary | ICD-10-CM | POA: Diagnosis not present

## 2023-05-04 DIAGNOSIS — Z7401 Bed confinement status: Secondary | ICD-10-CM | POA: Diagnosis not present

## 2023-05-04 DIAGNOSIS — J90 Pleural effusion, not elsewhere classified: Secondary | ICD-10-CM | POA: Diagnosis not present

## 2023-05-04 DIAGNOSIS — Z951 Presence of aortocoronary bypass graft: Secondary | ICD-10-CM | POA: Diagnosis not present

## 2023-05-04 DIAGNOSIS — I214 Non-ST elevation (NSTEMI) myocardial infarction: Secondary | ICD-10-CM | POA: Diagnosis not present

## 2023-05-04 DIAGNOSIS — K219 Gastro-esophageal reflux disease without esophagitis: Secondary | ICD-10-CM | POA: Diagnosis not present

## 2023-05-04 DIAGNOSIS — R531 Weakness: Secondary | ICD-10-CM | POA: Diagnosis not present

## 2023-05-04 DIAGNOSIS — E43 Unspecified severe protein-calorie malnutrition: Secondary | ICD-10-CM | POA: Diagnosis not present

## 2023-05-04 DIAGNOSIS — M6281 Muscle weakness (generalized): Secondary | ICD-10-CM | POA: Diagnosis not present

## 2023-05-04 DIAGNOSIS — R404 Transient alteration of awareness: Secondary | ICD-10-CM | POA: Diagnosis not present

## 2023-05-04 DIAGNOSIS — M25561 Pain in right knee: Secondary | ICD-10-CM | POA: Diagnosis not present

## 2023-05-04 DIAGNOSIS — Z9889 Other specified postprocedural states: Secondary | ICD-10-CM | POA: Diagnosis not present

## 2023-05-04 DIAGNOSIS — S1180XA Unspecified open wound of other specified part of neck, initial encounter: Secondary | ICD-10-CM | POA: Diagnosis not present

## 2023-05-04 DIAGNOSIS — M25562 Pain in left knee: Secondary | ICD-10-CM | POA: Diagnosis not present

## 2023-05-04 DIAGNOSIS — I6522 Occlusion and stenosis of left carotid artery: Secondary | ICD-10-CM | POA: Diagnosis not present

## 2023-05-04 DIAGNOSIS — I5032 Chronic diastolic (congestive) heart failure: Secondary | ICD-10-CM | POA: Diagnosis not present

## 2023-05-04 DIAGNOSIS — N179 Acute kidney failure, unspecified: Secondary | ICD-10-CM | POA: Diagnosis not present

## 2023-05-04 NOTE — TOC Transition Note (Signed)
 Transition of Care [facility]) - Discharge Note   Patient Details  Name: Walter Vargas MRN: [id number] Date of Birth: Oct 18, 1945  Transition of Care [facility]) CM/SW Contact:  Eduard Roux, LCSW Phone Number: 05/04/2023, 3:24 PM   Clinical Narrative:     Patient will Discharge to: [facility]  Discharge Date: 05/04/2023 Family Notified: daughter Transport LK:GMWN  Per MD patient is ready for discharge. RN, patient, and facility notified of discharge. Discharge Summary sent to facility. RN given number for [phone], Room 90-B. Ambulance transport requested for patient.   Clinical Social Worker signing off.  Antony Blackbird, MSW, LCSW Clinical Social Worker     Final next level of care: Skilled Nursing Facility Barriers to Discharge: Barriers Resolved   Patient Goals and CMS Choice            Discharge Placement                       Discharge Plan and Services Additional resources added to the After Visit Summary for   In-house Referral: Clinical Social Work                                   Social Drivers of Health (SDOH) Interventions SDOH Screenings   Food Insecurity: No Food Insecurity (04/26/2023)  Housing: Low Risk  (04/26/2023)  Transportation Needs: No Transportation Needs (04/26/2023)  Utilities: Not At Risk (04/26/2023)  Depression (PHQ2-9): Low Risk  (10/19/2022)  Social Connections: Patient Unable To Answer (04/26/2023)  Tobacco Use: Medium Risk (04/25/2023)     Readmission Risk Interventions    03/20/2023   12:12 PM  Readmission Risk Prevention Plan  Transportation Screening Complete  Medication Review Oceanographer) Complete  HRI or Home Care Consult Complete  SW Recovery Care/Counseling Consult Complete  Palliative Care Screening Not Applicable  Skilled Nursing Facility Not Applicable

## 2023-05-04 NOTE — TOC Progression Note (Signed)
 Transition of Care [facility]) - Progression Note    Patient Details  Name: Walter Vargas MRN: [id number] Date of Birth: [birth year]  Transition of Care [facility]) CM/SW Contact  Eduard Roux, [place] Phone Number: 05/04/2023, 3:09 PM  Clinical Narrative:     [facility] - 12:58 and 2:06, left voice message to return call-   2:21- received call back from [facility]  Admissions - informed they do not have a bed and may not have a bed until Monday.   2:26pm - called [facility] - they confirmed they have availability and can accept patient today.  2:36 pm- called patient's daughter - informed, no bed available at [facility] -however, [facility] has availability( family wants patient in [place]) and can  admit today. She accepted the bed offer.   2:47 pm - called [facility] insurance - requested  SNF authorization change form [facility]  to [facility] # [id number]  good through 02/21-02/23.  3:07 pm- called patient's daughter back- updated will d/c to [place] today and transportation will be arranged.   Antony Blackbird, MSW, LCSW Clinical Social Worker    Expected Discharge Plan: Skilled Nursing Facility Barriers to Discharge: Barriers Resolved  Expected Discharge Plan and Services In-house Referral: Clinical Social Work     Living arrangements for the past 2 months: Single Family Home Expected Discharge Date: 05/02/23                                     Social Determinants of Health (SDOH) Interventions SDOH Screenings   Food Insecurity: No Food Insecurity (04/26/2023)  Housing: Low Risk  (04/26/2023)  Transportation Needs: No Transportation Needs (04/26/2023)  Utilities: Not At Risk (04/26/2023)  Depression (PHQ2-9): Low Risk  (10/19/2022)  Social Connections: Patient Unable To Answer (04/26/2023)  Tobacco Use: Medium Risk (04/25/2023)    Readmission Risk Interventions    03/20/2023   12:12 PM  Readmission Risk Prevention  Plan  Transportation Screening Complete  Medication Review Oceanographer) Complete  HRI or Home Care Consult Complete  SW Recovery Care/Counseling Consult Complete  Palliative Care Screening Not Applicable  Skilled Nursing Facility Not Applicable

## 2023-05-04 NOTE — Progress Notes (Signed)
  Progress Note    05/04/2023 6:54 AM 9 Days Post-Op  Subjective:  no complaints.  No trouble swallowing.    Afebrile HR 60's 110's systolic 97% RA  Vitals:   05/04/23 0340 05/04/23 0345  BP: (!) 109/51 (!) 109/51  Pulse: 87 62  Resp: 17 16  Temp: 98.6 F (37 C)   SpO2: 100%     Physical Exam: General:  no distress Lungs:  non labored Incisions:  clean with small hematoma.  Unchanged.     CBC    Component Value Date/Time   WBC 6.0 05/03/2023 0259   RBC 3.44 (L) 05/03/2023 0259   HGB 9.6 (L) 05/03/2023 0259   HCT 31.1 (L) 05/03/2023 0259   PLT 286 05/03/2023 0259   MCV 90.4 05/03/2023 0259   MCH 27.9 05/03/2023 0259   MCHC 30.9 05/03/2023 0259   RDW 19.1 (H) 05/03/2023 0259   LYMPHSABS 0.8 03/28/2023 0536   MONOABS 0.8 03/28/2023 0536   EOSABS 0.2 03/28/2023 0536   BASOSABS 0.1 03/28/2023 0536    BMET    Component Value Date/Time   NA 135 04/26/2023 0440   K 4.4 04/26/2023 0440   CL 102 04/26/2023 0440   CO2 25 04/26/2023 0440   GLUCOSE 132 (H) 04/26/2023 0440   BUN 26 (H) 04/26/2023 0440   CREATININE 0.78 04/26/2023 0440   CALCIUM 8.4 (L) 04/26/2023 0440   GFRNONAA >60 04/26/2023 0440    INR    Component Value Date/Time   INR 1.6 (H) 03/03/2023 0314     Intake/Output Summary (Last 24 hours) at 05/04/2023 0654 Last data filed at 05/04/2023 0622 Gross per 24 hour  Intake --  Output 800 ml  Net -800 ml      Assessment/Plan:  77 y.o. male is s/p:  Left CEA for symptomatic left carotid artery stenosis   9 Days Post-Op   -doing well.  Incision and hematoma appear unchanged -awaiting SNF  -DVT prophylaxis:  Eliquis   Doreatha Massed, PA-C [facility] [phone] 05/04/2023 6:54 AM

## 2023-05-04 NOTE — Progress Notes (Signed)
 This nurse called facility number and spoke with the nurse Taking this patient to give report. Patient daughter notified patient of discharge.

## 2023-05-04 NOTE — Progress Notes (Signed)
 Mobility Specialist Progress Note:    05/04/23 1057  Mobility  Activity Ambulated with assistance in room;Ambulated with assistance in hallway;Transferred from bed to chair  Level of Assistance Minimal assist, patient does 75% or more  Assistive Device Front wheel walker  Distance Ambulated (ft) 400 ft  Activity Response Tolerated well  Mobility Referral Yes  Mobility visit 1 Mobility  Mobility Specialist Start Time (ACUTE ONLY) 1040  Mobility Specialist Stop Time (ACUTE ONLY) 1055  Mobility Specialist Time Calculation (min) (ACUTE ONLY) 15 min   Pt received in bed, agreeable after encouragement. Ambulated in hallway, MinA to stand with RW, CGA during mobility. Slightly impulsive during session, needs tactile and verbal cues for RW safety and lowering self into chair. Left pt in chair with alarm on, noticed pt's nose bleeding, RN notified.   Feliciana Rossetti Mobility Specialist Please contact via Special educational needs teacher or  Rehab office at [phone]

## 2023-05-05 ENCOUNTER — Other Ambulatory Visit: Payer: Self-pay | Admitting: Nurse Practitioner

## 2023-05-05 DIAGNOSIS — R0609 Other forms of dyspnea: Secondary | ICD-10-CM

## 2023-05-05 DIAGNOSIS — R600 Localized edema: Secondary | ICD-10-CM

## 2023-05-05 DIAGNOSIS — J90 Pleural effusion, not elsewhere classified: Secondary | ICD-10-CM

## 2023-05-07 DIAGNOSIS — L89626 Pressure-induced deep tissue damage of left heel: Secondary | ICD-10-CM | POA: Diagnosis not present

## 2023-05-07 DIAGNOSIS — S1180XA Unspecified open wound of other specified part of neck, initial encounter: Secondary | ICD-10-CM | POA: Diagnosis not present

## 2023-05-07 DIAGNOSIS — S50311A Abrasion of right elbow, initial encounter: Secondary | ICD-10-CM | POA: Diagnosis not present

## 2023-05-07 DIAGNOSIS — S51812A Laceration without foreign body of left forearm, initial encounter: Secondary | ICD-10-CM | POA: Diagnosis not present

## 2023-05-08 ENCOUNTER — Ambulatory Visit: Payer: 59 | Admitting: Nurse Practitioner

## 2023-05-08 DIAGNOSIS — I5032 Chronic diastolic (congestive) heart failure: Secondary | ICD-10-CM | POA: Diagnosis not present

## 2023-05-08 DIAGNOSIS — I63512 Cerebral infarction due to unspecified occlusion or stenosis of left middle cerebral artery: Secondary | ICD-10-CM | POA: Diagnosis not present

## 2023-05-08 DIAGNOSIS — N179 Acute kidney failure, unspecified: Secondary | ICD-10-CM | POA: Diagnosis not present

## 2023-05-08 DIAGNOSIS — I214 Non-ST elevation (NSTEMI) myocardial infarction: Secondary | ICD-10-CM | POA: Diagnosis not present

## 2023-05-09 ENCOUNTER — Encounter: Payer: Self-pay | Admitting: Physician Assistant

## 2023-05-09 ENCOUNTER — Ambulatory Visit (INDEPENDENT_AMBULATORY_CARE_PROVIDER_SITE_OTHER): Payer: 59 | Admitting: Physician Assistant

## 2023-05-09 ENCOUNTER — Ambulatory Visit
Admission: RE | Admit: 2023-05-09 | Discharge: 2023-05-09 | Disposition: A | Discharge status: Home / Self Care | Payer: 59 | Source: Ambulatory Visit | Attending: Thoracic Surgery (Cardiothoracic Vascular Surgery) | Admitting: Thoracic Surgery (Cardiothoracic Vascular Surgery)

## 2023-05-09 VITALS — BP 120/56 | HR 60 | Resp 18 | Ht 68.0 in | Wt 122.0 lb

## 2023-05-09 DIAGNOSIS — R2681 Unsteadiness on feet: Secondary | ICD-10-CM | POA: Diagnosis not present

## 2023-05-09 DIAGNOSIS — Z951 Presence of aortocoronary bypass graft: Secondary | ICD-10-CM | POA: Diagnosis not present

## 2023-05-09 DIAGNOSIS — Z9889 Other specified postprocedural states: Secondary | ICD-10-CM | POA: Diagnosis not present

## 2023-05-09 DIAGNOSIS — I63512 Cerebral infarction due to unspecified occlusion or stenosis of left middle cerebral artery: Secondary | ICD-10-CM | POA: Diagnosis not present

## 2023-05-09 DIAGNOSIS — I6522 Occlusion and stenosis of left carotid artery: Secondary | ICD-10-CM | POA: Diagnosis not present

## 2023-05-09 DIAGNOSIS — J90 Pleural effusion, not elsewhere classified: Secondary | ICD-10-CM | POA: Diagnosis not present

## 2023-05-09 DIAGNOSIS — I2510 Atherosclerotic heart disease of native coronary artery without angina pectoris: Secondary | ICD-10-CM

## 2023-05-09 DIAGNOSIS — I5032 Chronic diastolic (congestive) heart failure: Secondary | ICD-10-CM | POA: Diagnosis not present

## 2023-05-11 DIAGNOSIS — I6522 Occlusion and stenosis of left carotid artery: Secondary | ICD-10-CM | POA: Diagnosis not present

## 2023-05-11 DIAGNOSIS — I63512 Cerebral infarction due to unspecified occlusion or stenosis of left middle cerebral artery: Secondary | ICD-10-CM | POA: Diagnosis not present

## 2023-05-14 DIAGNOSIS — I5032 Chronic diastolic (congestive) heart failure: Secondary | ICD-10-CM | POA: Diagnosis not present

## 2023-05-14 DIAGNOSIS — L89626 Pressure-induced deep tissue damage of left heel: Secondary | ICD-10-CM | POA: Diagnosis not present

## 2023-05-14 DIAGNOSIS — I6522 Occlusion and stenosis of left carotid artery: Secondary | ICD-10-CM | POA: Diagnosis not present

## 2023-05-14 DIAGNOSIS — E43 Unspecified severe protein-calorie malnutrition: Secondary | ICD-10-CM | POA: Diagnosis not present

## 2023-05-14 DIAGNOSIS — R2681 Unsteadiness on feet: Secondary | ICD-10-CM | POA: Diagnosis not present

## 2023-05-16 DIAGNOSIS — R2681 Unsteadiness on feet: Secondary | ICD-10-CM | POA: Diagnosis not present

## 2023-05-16 DIAGNOSIS — I6522 Occlusion and stenosis of left carotid artery: Secondary | ICD-10-CM | POA: Diagnosis not present

## 2023-05-21 DIAGNOSIS — I5032 Chronic diastolic (congestive) heart failure: Secondary | ICD-10-CM | POA: Diagnosis not present

## 2023-05-21 DIAGNOSIS — L89626 Pressure-induced deep tissue damage of left heel: Secondary | ICD-10-CM | POA: Diagnosis not present

## 2023-05-21 DIAGNOSIS — M6281 Muscle weakness (generalized): Secondary | ICD-10-CM | POA: Diagnosis not present

## 2023-05-21 DIAGNOSIS — E43 Unspecified severe protein-calorie malnutrition: Secondary | ICD-10-CM | POA: Diagnosis not present

## 2023-05-21 DIAGNOSIS — R451 Restlessness and agitation: Secondary | ICD-10-CM | POA: Diagnosis not present

## 2023-05-22 ENCOUNTER — Telehealth: Payer: Self-pay

## 2023-05-22 DIAGNOSIS — Z789 Other specified health status: Secondary | ICD-10-CM

## 2023-05-22 NOTE — Transitions of Care (Post Inpatient/ED Visit) (Signed)
   05/22/2023  Name: Walter Vargas MRN: [id number] DOB: Mar 08, 1946  Today's TOC FU Call Status: Today's TOC FU Call Status:: Successful TOC FU Call Completed TOC FU Call Complete Date: 05/22/23 Patient's Name and Date of Birth confirmed.  Transition Care Management Follow-up Telephone Call Date of Discharge: 05/21/23 Discharge Facility: Other ([facility]) Name of Other ([facility]) Discharge Facility: [facility] Type of Discharge: Inpatient Admission Primary Inpatient Discharge Diagnosis:: cerebral infarction How have you been since you were released from the hospital?: Same Any questions or concerns?: Yes Patient Questions/Concerns:: patient is home alone, has fallen twice since he was released. He is not taking his meds Patient Questions/Concerns Addressed: Notified Provider of Patient Questions/Concerns  Items Reviewed: Did you receive and understand the discharge instructions provided?: Yes Medications obtained,verified, and reconciled?: Yes (Medications Reviewed) Any new allergies since your discharge?: No Dietary orders reviewed?: Yes Do you have support at home?: Yes People in Home: friend(s)  Medications Reviewed Today: Medications Reviewed Today     Reviewed by Karena Addison, LPN (Licensed Practical Nurse) on 05/22/23 at 1010  Med List Status: <None>   Medication Order Taking? Sig Documenting Provider Last Dose Status Informant  amiodarone (PACERONE) 200 MG tablet [id number] No Take 1 tablet (200 mg total) by mouth daily. Angiulli, Mcarthur Rossetti, PA-C Taking Active   apixaban (ELIQUIS) 5 MG TABS tablet [id number] No Take 1 tablet (5 mg total) by mouth 2 (two) times daily. Dara Lords, PA-C Taking Active   ascorbic acid (VITAMIN C) 500 MG tablet [id number] No Take 1 tablet (500 mg total) by mouth daily. Charlton Amor, PA-C Taking Active   aspirin 81 MG chewable tablet [id number] No Chew 1 tablet (81 mg total) by mouth daily. Charlton Amor, PA-C Taking Active    atorvastatin (LIPITOR) 80 MG tablet [id number] No Take 1 tablet (80 mg total) by mouth daily. Leeroy Bock, MD Taking Active Self, Pharmacy Records  digoxin (LANOXIN) 0.125 MG tablet [id number] No Take 1 tablet (0.125 mg total) by mouth daily. Angiulli, Mcarthur Rossetti, PA-C Taking Active   docusate (COLACE) 50 MG/5ML liquid [id number] No Take 10 mLs (100 mg total) by mouth 2 (two) times daily. Charlton Amor, PA-C Taking Active   hydrOXYzine (ATARAX) 25 MG tablet [id number] No Take 1 tablet (25 mg total) by mouth 3 (three) times daily as needed for anxiety. Charlton Amor, PA-C Taking Active   Multiple Vitamins-Minerals (MULTIVITAMIN MEN 50+) TABS [id number] No Take 1 tablet by mouth daily. [provider] Taking Active Self, Pharmacy Records  pantoprazole (PROTONIX) 40 MG tablet [id number] No Take 1 tablet (40 mg total) by mouth 2 (two) times daily before a meal. Angiulli, Mcarthur Rossetti, PA-C Taking Active             Home Care and Equipment/Supplies: Were Home Health Services Ordered?: NA Any new equipment or medical supplies ordered?: NA  Functional Questionnaire: Do you need assistance with bathing/showering or dressing?: Yes Do you need assistance with meal preparation?: Yes Do you need assistance with eating?: No Do you have difficulty maintaining continence: Yes Do you need assistance with getting out of bed/getting out of a chair/moving?: No Do you have difficulty managing or taking your medications?: Yes  Follow up appointments reviewed: Specialist Hospital Follow-up appointment confirmed?: NA Do you need transportation to your follow-up appointment?: No Do you understand care options if your condition(s) worsen?: Yes-patient verbalized understanding    SIGNATURE Karena Addison, LPN [facility] Nurse Health Advisor Direct Dial [phone]

## 2023-05-22 NOTE — Telephone Encounter (Signed)
 noted

## 2023-05-23 ENCOUNTER — Telehealth: Payer: Self-pay | Admitting: *Deleted

## 2023-05-23 ENCOUNTER — Telehealth: Payer: Self-pay

## 2023-05-23 NOTE — Telephone Encounter (Signed)
 Pt's son is not listed on DPR.  Pt has no one listed on his DPR.  No updated DPR since 2023

## 2023-05-23 NOTE — Progress Notes (Unsigned)
 Complex Care Management Note Care Guide Note  05/23/2023 Name: ASAIAH SCARBER MRN: [id number] DOB: October 03, 1945   Complex Care Management Outreach Attempts: An unsuccessful telephone outreach was attempted today to offer the patient information about available complex care management services.  Follow Up Plan:  Additional outreach attempts will be made to offer the patient complex care management information and services.   Encounter Outcome:  No Answer  Gwenevere Ghazi  [facility]  [facility] Guide  Direct Dial: [phone]  Fax [phone]

## 2023-05-23 NOTE — Telephone Encounter (Signed)
 Patient son called in wanting to speak with someone regarding his dad  follow up care home health he would like a call back today he has meeting starting around 10:30

## 2023-05-23 NOTE — Telephone Encounter (Signed)
 Left voicemail for patient to call the office back.

## 2023-05-23 NOTE — Telephone Encounter (Signed)
 Copied from CRM [id number]. Topic: Clinical - Medical Advice >> May 23, 2023 10:28 AM [place] J wrote: Reason for CRM: Patient's son Remi Deter calling in needing to speak with provider's nurse. Patient's son has meetings starting at 11AM ending at 3:30PM - 4:00PM.

## 2023-05-24 ENCOUNTER — Other Ambulatory Visit: Payer: Self-pay

## 2023-05-24 ENCOUNTER — Emergency Department

## 2023-05-24 ENCOUNTER — Inpatient Hospital Stay
Admission: EM | Admit: 2023-05-24 | Discharge: 2023-06-22 | DRG: 871 | Disposition: A | Discharge status: Skilled Nursing Facility | Attending: Internal Medicine | Admitting: Internal Medicine

## 2023-05-24 DIAGNOSIS — S7011XA Contusion of right thigh, initial encounter: Secondary | ICD-10-CM | POA: Diagnosis not present

## 2023-05-24 DIAGNOSIS — I272 Pulmonary hypertension, unspecified: Secondary | ICD-10-CM | POA: Diagnosis not present

## 2023-05-24 DIAGNOSIS — Z7982 Long term (current) use of aspirin: Secondary | ICD-10-CM

## 2023-05-24 DIAGNOSIS — Z1152 Encounter for screening for COVID-19: Secondary | ICD-10-CM | POA: Diagnosis not present

## 2023-05-24 DIAGNOSIS — I5042 Chronic combined systolic (congestive) and diastolic (congestive) heart failure: Secondary | ICD-10-CM | POA: Diagnosis not present

## 2023-05-24 DIAGNOSIS — I63512 Cerebral infarction due to unspecified occlusion or stenosis of left middle cerebral artery: Secondary | ICD-10-CM | POA: Diagnosis not present

## 2023-05-24 DIAGNOSIS — S3993XA Unspecified injury of pelvis, initial encounter: Secondary | ICD-10-CM | POA: Diagnosis not present

## 2023-05-24 DIAGNOSIS — R6520 Severe sepsis without septic shock: Secondary | ICD-10-CM | POA: Diagnosis not present

## 2023-05-24 DIAGNOSIS — A419 Sepsis, unspecified organism: Secondary | ICD-10-CM | POA: Diagnosis not present

## 2023-05-24 DIAGNOSIS — Z811 Family history of alcohol abuse and dependence: Secondary | ICD-10-CM

## 2023-05-24 DIAGNOSIS — I739 Peripheral vascular disease, unspecified: Secondary | ICD-10-CM | POA: Diagnosis not present

## 2023-05-24 DIAGNOSIS — Z79899 Other long term (current) drug therapy: Secondary | ICD-10-CM

## 2023-05-24 DIAGNOSIS — Z833 Family history of diabetes mellitus: Secondary | ICD-10-CM

## 2023-05-24 DIAGNOSIS — Z681 Body mass index (BMI) 19 or less, adult: Secondary | ICD-10-CM

## 2023-05-24 DIAGNOSIS — J189 Pneumonia, unspecified organism: Secondary | ICD-10-CM | POA: Diagnosis not present

## 2023-05-24 DIAGNOSIS — E871 Hypo-osmolality and hyponatremia: Secondary | ICD-10-CM | POA: Diagnosis not present

## 2023-05-24 DIAGNOSIS — I2510 Atherosclerotic heart disease of native coronary artery without angina pectoris: Secondary | ICD-10-CM | POA: Diagnosis not present

## 2023-05-24 DIAGNOSIS — E872 Acidosis, unspecified: Secondary | ICD-10-CM | POA: Diagnosis not present

## 2023-05-24 DIAGNOSIS — W19XXXA Unspecified fall, initial encounter: Principal | ICD-10-CM

## 2023-05-24 DIAGNOSIS — R102 Pelvic and perineal pain: Secondary | ICD-10-CM

## 2023-05-24 DIAGNOSIS — R001 Bradycardia, unspecified: Secondary | ICD-10-CM | POA: Diagnosis not present

## 2023-05-24 DIAGNOSIS — K5730 Diverticulosis of large intestine without perforation or abscess without bleeding: Secondary | ICD-10-CM | POA: Diagnosis not present

## 2023-05-24 DIAGNOSIS — J9601 Acute respiratory failure with hypoxia: Secondary | ICD-10-CM | POA: Diagnosis not present

## 2023-05-24 DIAGNOSIS — E43 Unspecified severe protein-calorie malnutrition: Secondary | ICD-10-CM | POA: Diagnosis not present

## 2023-05-24 DIAGNOSIS — I10 Essential (primary) hypertension: Secondary | ICD-10-CM | POA: Insufficient documentation

## 2023-05-24 DIAGNOSIS — I6782 Cerebral ischemia: Secondary | ICD-10-CM | POA: Diagnosis not present

## 2023-05-24 DIAGNOSIS — R2681 Unsteadiness on feet: Secondary | ICD-10-CM | POA: Diagnosis not present

## 2023-05-24 DIAGNOSIS — M6281 Muscle weakness (generalized): Secondary | ICD-10-CM | POA: Diagnosis not present

## 2023-05-24 DIAGNOSIS — Z813 Family history of other psychoactive substance abuse and dependence: Secondary | ICD-10-CM

## 2023-05-24 DIAGNOSIS — I255 Ischemic cardiomyopathy: Secondary | ICD-10-CM | POA: Diagnosis not present

## 2023-05-24 DIAGNOSIS — Z951 Presence of aortocoronary bypass graft: Secondary | ICD-10-CM | POA: Diagnosis not present

## 2023-05-24 DIAGNOSIS — E785 Hyperlipidemia, unspecified: Secondary | ICD-10-CM | POA: Diagnosis present

## 2023-05-24 DIAGNOSIS — D649 Anemia, unspecified: Secondary | ICD-10-CM | POA: Diagnosis present

## 2023-05-24 DIAGNOSIS — R296 Repeated falls: Secondary | ICD-10-CM | POA: Diagnosis present

## 2023-05-24 DIAGNOSIS — R64 Cachexia: Secondary | ICD-10-CM | POA: Diagnosis not present

## 2023-05-24 DIAGNOSIS — M47812 Spondylosis without myelopathy or radiculopathy, cervical region: Secondary | ICD-10-CM | POA: Diagnosis not present

## 2023-05-24 DIAGNOSIS — R0609 Other forms of dyspnea: Secondary | ICD-10-CM | POA: Diagnosis present

## 2023-05-24 DIAGNOSIS — Z8673 Personal history of transient ischemic attack (TIA), and cerebral infarction without residual deficits: Secondary | ICD-10-CM

## 2023-05-24 DIAGNOSIS — I2489 Other forms of acute ischemic heart disease: Secondary | ICD-10-CM | POA: Diagnosis not present

## 2023-05-24 DIAGNOSIS — R7989 Other specified abnormal findings of blood chemistry: Secondary | ICD-10-CM | POA: Diagnosis present

## 2023-05-24 DIAGNOSIS — L89152 Pressure ulcer of sacral region, stage 2: Secondary | ICD-10-CM | POA: Diagnosis present

## 2023-05-24 DIAGNOSIS — J90 Pleural effusion, not elsewhere classified: Secondary | ICD-10-CM | POA: Diagnosis present

## 2023-05-24 DIAGNOSIS — M19011 Primary osteoarthritis, right shoulder: Secondary | ICD-10-CM | POA: Diagnosis not present

## 2023-05-24 DIAGNOSIS — Z8249 Family history of ischemic heart disease and other diseases of the circulatory system: Secondary | ICD-10-CM

## 2023-05-24 DIAGNOSIS — L89621 Pressure ulcer of left heel, stage 1: Secondary | ICD-10-CM | POA: Diagnosis present

## 2023-05-24 DIAGNOSIS — I509 Heart failure, unspecified: Secondary | ICD-10-CM

## 2023-05-24 DIAGNOSIS — I472 Ventricular tachycardia, unspecified: Secondary | ICD-10-CM | POA: Diagnosis not present

## 2023-05-24 DIAGNOSIS — R059 Cough, unspecified: Secondary | ICD-10-CM | POA: Diagnosis not present

## 2023-05-24 DIAGNOSIS — I252 Old myocardial infarction: Secondary | ICD-10-CM

## 2023-05-24 DIAGNOSIS — E861 Hypovolemia: Secondary | ICD-10-CM | POA: Diagnosis not present

## 2023-05-24 DIAGNOSIS — I110 Hypertensive heart disease with heart failure: Secondary | ICD-10-CM | POA: Diagnosis not present

## 2023-05-24 DIAGNOSIS — R918 Other nonspecific abnormal finding of lung field: Secondary | ICD-10-CM | POA: Diagnosis not present

## 2023-05-24 DIAGNOSIS — I25118 Atherosclerotic heart disease of native coronary artery with other forms of angina pectoris: Secondary | ICD-10-CM | POA: Diagnosis not present

## 2023-05-24 DIAGNOSIS — I499 Cardiac arrhythmia, unspecified: Secondary | ICD-10-CM | POA: Diagnosis not present

## 2023-05-24 DIAGNOSIS — R0602 Shortness of breath: Secondary | ICD-10-CM | POA: Diagnosis not present

## 2023-05-24 DIAGNOSIS — R5381 Other malaise: Secondary | ICD-10-CM | POA: Diagnosis not present

## 2023-05-24 DIAGNOSIS — Z043 Encounter for examination and observation following other accident: Secondary | ICD-10-CM | POA: Diagnosis not present

## 2023-05-24 DIAGNOSIS — Z87891 Personal history of nicotine dependence: Secondary | ICD-10-CM

## 2023-05-24 DIAGNOSIS — I6522 Occlusion and stenosis of left carotid artery: Secondary | ICD-10-CM | POA: Diagnosis not present

## 2023-05-24 DIAGNOSIS — S43001A Unspecified subluxation of right shoulder joint, initial encounter: Secondary | ICD-10-CM | POA: Diagnosis not present

## 2023-05-24 DIAGNOSIS — I447 Left bundle-branch block, unspecified: Secondary | ICD-10-CM | POA: Diagnosis not present

## 2023-05-24 DIAGNOSIS — Z7901 Long term (current) use of anticoagulants: Secondary | ICD-10-CM

## 2023-05-24 DIAGNOSIS — R54 Age-related physical debility: Secondary | ICD-10-CM | POA: Diagnosis not present

## 2023-05-24 LAB — URINALYSIS, ROUTINE W REFLEX MICROSCOPIC
Bilirubin Urine: NEGATIVE
Glucose, UA: NEGATIVE mg/dL
Hgb urine dipstick: NEGATIVE
Ketones, ur: NEGATIVE mg/dL
Leukocytes,Ua: NEGATIVE
Nitrite: NEGATIVE
Protein, ur: NEGATIVE mg/dL
Specific Gravity, Urine: 1.023 (ref 1.005–1.030)
pH: 6 (ref 5.0–8.0)

## 2023-05-24 LAB — CBC WITH DIFFERENTIAL/PLATELET
Abs Immature Granulocytes: 0.02 10*3/uL (ref 0.00–0.07)
Basophils Absolute: 0 10*3/uL (ref 0.0–0.1)
Basophils Relative: 1 %
Eosinophils Absolute: 0.2 10*3/uL (ref 0.0–0.5)
Eosinophils Relative: 2 %
HCT: 32.2 % — ABNORMAL LOW (ref 39.0–52.0)
Hemoglobin: 9.9 g/dL — ABNORMAL LOW (ref 13.0–17.0)
Immature Granulocytes: 0 %
Lymphocytes Relative: 16 %
Lymphs Abs: 1 10*3/uL (ref 0.7–4.0)
MCH: 28.2 pg (ref 26.0–34.0)
MCHC: 30.7 g/dL (ref 30.0–36.0)
MCV: 91.7 fL (ref 80.0–100.0)
Monocytes Absolute: 0.8 10*3/uL (ref 0.1–1.0)
Monocytes Relative: 12 %
Neutro Abs: 4.6 10*3/uL (ref 1.7–7.7)
Neutrophils Relative %: 69 %
Platelets: 295 10*3/uL (ref 150–400)
RBC: 3.51 MIL/uL — ABNORMAL LOW (ref 4.22–5.81)
RDW: 18.5 % — ABNORMAL HIGH (ref 11.5–15.5)
WBC: 6.6 10*3/uL (ref 4.0–10.5)
nRBC: 0 % (ref 0.0–0.2)

## 2023-05-24 LAB — COMPREHENSIVE METABOLIC PANEL
ALT: 22 U/L (ref 0–44)
AST: 36 U/L (ref 15–41)
Albumin: 3.1 g/dL — ABNORMAL LOW (ref 3.5–5.0)
Alkaline Phosphatase: 70 U/L (ref 38–126)
Anion gap: 9 (ref 5–15)
BUN: 18 mg/dL (ref 8–23)
CO2: 25 mmol/L (ref 22–32)
Calcium: 8.6 mg/dL — ABNORMAL LOW (ref 8.9–10.3)
Chloride: 103 mmol/L (ref 98–111)
Creatinine, Ser: 0.69 mg/dL (ref 0.61–1.24)
GFR, Estimated: >60 mL/min (ref >60)
Glucose, Bld: 77 mg/dL (ref 70–99)
Potassium: 4 mmol/L (ref 3.5–5.1)
Sodium: 137 mmol/L (ref 135–145)
Total Bilirubin: 1.7 mg/dL — ABNORMAL HIGH (ref 0.0–1.2)
Total Protein: 6.4 g/dL — ABNORMAL LOW (ref 6.5–8.1)

## 2023-05-24 LAB — TROPONIN I (HIGH SENSITIVITY)
Troponin I (High Sensitivity): 30 ng/L — ABNORMAL HIGH (ref <18)
Troponin I (High Sensitivity): 33 ng/L — ABNORMAL HIGH (ref <18)

## 2023-05-24 LAB — CK: Total CK: 172 U/L (ref 49–397)

## 2023-05-24 NOTE — ED Triage Notes (Signed)
 BIB ems from home for fall around 0730 this morning.   Per ems "he has been on the floor since the fall this morning."  Per pt "I was trying to get to my shoes and just fell, landed on the hard wood floor."  Denies loc, states "I had to kind of wiggle over to the phone."  Complains of aches and pains from being on floor for so long, has some abrasions to right arm

## 2023-05-24 NOTE — Progress Notes (Unsigned)
 Complex Care Management Note Care Guide Note  05/24/2023 Name: Walter Vargas MRN: [id number] DOB: 1945/11/08   Complex Care Management Outreach Attempts: A second unsuccessful outreach was attempted today to offer the patient with information about available complex care management services.  Follow Up Plan:  Additional outreach attempts will be made to offer the patient complex care management information and services.   Encounter Outcome:  No Answer  Gwenevere Ghazi  [facility]  [facility], [facility] Guide  Direct Dial: [phone]  Fax [phone]

## 2023-05-24 NOTE — ED Provider Notes (Signed)
 [facility] Provider Note    Event Date/Time   First MD Initiated Contact with Patient 05/24/23 2045     (approximate)   History   Fall   HPI  Walter Vargas is a 78 y.o. male with a history of CABG, carotid endarterectomy who comes in with concerns for a fall.  Patient has had multiple falls recently.  He fell this morning around 7 AM and was not able to get up off the floor.  He denies hitting his head but does report some right hip pain.  He does have some significant old looking bruise on his right buttock area.  He denies any chest pain, shortness of breath, abdominal pain.  Does report living by himself.  He states that he is walks on his own without a walker. Pt denies any alcohol or drugs.    Physical Exam   Triage Vital Signs: ED Triage Vitals [05/24/23 2044]  Encounter Vitals Group     BP      Systolic BP Percentile      Diastolic BP Percentile      Pulse      Resp      Temp      Temp src      SpO2 100 %     Weight      Height      Head Circumference      Peak Flow      Pain Score      Pain Loc      Pain Education      Exclude from Growth Chart     Most recent vital signs: Vitals:   05/24/23 2044  SpO2: 100%     General: Awake, no distress.  CV:  Good peripheral perfusion.  Resp:  Normal effort.  Abd:  No distention.  Other:  Some pain in his right shoulder but moving his arms well.  He is got some pain in his right hip but still able to lift it up off the bed but he is got some old bruising noted on his right gluteus.  Abdomen soft and nontender no chest wall tenderness.   ED Results / Procedures / Treatments   Labs (all labs ordered are listed, but only abnormal results are displayed) Labs Reviewed  RESP PANEL BY RT-PCR (RSV, FLU A&B, COVID)  [id number]  CBC WITH DIFFERENTIAL/PLATELET  COMPREHENSIVE METABOLIC PANEL  CK  URINALYSIS, ROUTINE W REFLEX MICROSCOPIC  TROPONIN I (HIGH SENSITIVITY)     EKG  My  interpretation of EKG:  Normal sinus rhythm 72 without any ST elevation or T wave inversions, QTc 496  RADIOLOGY I have reviewed the CT head personally interpreted no evidence of intercranial hemorrhage   PROCEDURES:  Critical Care performed: No  Procedures   MEDICATIONS ORDERED IN ED: Medications - No data to display   IMPRESSION / MDM / ASSESSMENT AND PLAN / ED COURSE  I reviewed the triage vital signs and the nursing notes.   Patient's presentation is most consistent with acute presentation with potential threat to life or bodily function.   Patient comes in for a fall.  Patient is having multiple falls noted to not be able to get up off the ground for over 12 hours.  Will get blood work to evaluate for [employer] abnormalities, AKI, ACS.  Intracranial hemorrhage, pelvic fractures, cervical fractures.  CK is normal.  Hemoglobin stable at baseline at 9.9.  Creatinine is normal slight elevation of troponin.  Patient  will be handed off to oncoming team pending CT imaging and reassessment.  The patient is on the cardiac monitor to evaluate for evidence of arrhythmia and/or significant heart rate changes.      FINAL CLINICAL IMPRESSION(S) / ED DIAGNOSES   Final diagnoses:  Fall, initial encounter  Pelvic pain     Rx / DC Orders   ED Discharge Orders     None        Note:  This document was prepared using Dragon voice recognition software and may include unintentional dictation errors.   Concha Se, MD 05/24/23 2202

## 2023-05-25 LAB — RESP PANEL BY RT-PCR (RSV, FLU A&B, COVID)  RVPGX2
Influenza A by PCR: NEGATIVE
Influenza B by PCR: NEGATIVE
Resp Syncytial Virus by PCR: NEGATIVE
SARS Coronavirus 2 by RT PCR: NEGATIVE

## 2023-05-25 MED ORDER — DIGOXIN 125 MCG PO TABS
0.1250 mg | ORAL_TABLET | Freq: Every day | ORAL | Status: DC
  Administered 2023-05-25 – 2023-06-03 (×5): 0.125 mg via ORAL
  Filled 2023-05-25 (×10): qty 1

## 2023-05-25 MED ORDER — HYDROXYZINE HCL 25 MG PO TABS
25.0000 mg | ORAL_TABLET | Freq: Three times a day (TID) | ORAL | Status: DC | PRN
  Administered 2023-05-29 – 2023-06-22 (×6): 25 mg via ORAL
  Filled 2023-05-25 (×10): qty 1

## 2023-05-25 MED ORDER — ASPIRIN 81 MG PO CHEW
81.0000 mg | CHEWABLE_TABLET | Freq: Every day | ORAL | Status: DC
  Administered 2023-05-25 – 2023-06-22 (×28): 81 mg via ORAL
  Filled 2023-05-25 (×29): qty 1

## 2023-05-25 MED ORDER — APIXABAN 5 MG PO TABS
5.0000 mg | ORAL_TABLET | Freq: Two times a day (BID) | ORAL | Status: DC
  Administered 2023-05-25 – 2023-06-01 (×13): 5 mg via ORAL
  Filled 2023-05-25 (×14): qty 1

## 2023-05-25 MED ORDER — VITAMIN C 500 MG PO TABS
500.0000 mg | ORAL_TABLET | Freq: Every day | ORAL | Status: DC
  Administered 2023-05-25 – 2023-06-04 (×10): 500 mg via ORAL
  Filled 2023-05-25 (×11): qty 1

## 2023-05-25 MED ORDER — ATORVASTATIN CALCIUM 80 MG PO TABS
80.0000 mg | ORAL_TABLET | Freq: Every day | ORAL | Status: DC
  Administered 2023-05-25 – 2023-06-03 (×8): 80 mg via ORAL
  Filled 2023-05-25 (×5): qty 4
  Filled 2023-05-25: qty 1
  Filled 2023-05-25 (×3): qty 4

## 2023-05-25 MED ORDER — PANTOPRAZOLE SODIUM 40 MG PO TBEC
40.0000 mg | DELAYED_RELEASE_TABLET | Freq: Two times a day (BID) | ORAL | Status: DC
  Administered 2023-05-26 – 2023-06-22 (×51): 40 mg via ORAL
  Filled 2023-05-25 (×52): qty 1

## 2023-05-25 MED ORDER — AMIODARONE HCL 200 MG PO TABS
200.0000 mg | ORAL_TABLET | Freq: Every day | ORAL | Status: DC
  Administered 2023-05-25 – 2023-06-07 (×12): 200 mg via ORAL
  Filled 2023-05-25 (×13): qty 1

## 2023-05-25 NOTE — TOC Initial Note (Signed)
 Transition of Care [facility]) - Initial/Assessment Note    Patient Details  Name: Walter Vargas MRN: [id number] Date of Birth: 02-Jan-1946  [facility] CM/SW Contact:    Walter Broach, LCSW Phone Number: 05/25/2023, 2:57 PM  Clinical Narrative:                 CSW spoke with patient's son prior to speaking with patient to discuss d/c plan for this hospital visit. CSW introduced self and reason for call   CSW spoke with Walter Vargas [phone]) and explained that PT is recommending that pt goes back to a SNF for rehab.  Pt was recently discharged from [facility] (2/21-3/10) and he has no secondary payor.  Pt has 4 more days left that insurance will cover if he returns to SNF and afterwards, he would be in co-pay days.  This was explained to [place].  CSW informed Walter Vargas that pt was set up with HH ([facility]) from [facility], however, they hadn't been able to see patient before he returned to hospital.  CSW asked about family's ability to pay after co-pay days are over, if pt returns to SNF.  Walter Vargas wasn't sure of pt's financial status and he asked CSW to discuss with pt.  Walter Vargas was ok with referrals being made again.  CSW went to visit pt.  CSW introduced self and reason for visit. CSW explained the situation to him about having to pay co-pays if he goes back to SNF after he uses 4 days that is left from previous visit to [facility]. CSW inquired about his ability to pay for co-pay days.  Pt stated that he wasn't willing to give CSW any financial information. He asked that CSW speak with Walter Vargas about this process.  He states that he would like to go to [facility] if he does go back to a SNF.  CSW explained the process of making referrals and bed offers from facilities.  CSW spoke with Walter Vargas again to let him know that referrals had been made and, at this time, [facility] is the only facility who is offering pt a bed.  CSW had spoken with [facility], who stated that patient's appeal was denied when he was at [facility]  and he was discharged with home health services with [facility].  This information was given to [place].  CSW informed Walter Vargas that, TOC would follow-up with him tomorrow, to see if other bed offers come in.    CM to continue to follow for discharge needs.    Expected Discharge Plan: Skilled Nursing Facility Barriers to Discharge: Continued Medical Work up   Patient Goals and CMS Choice     Choice offered to / list presented to : Adult Children      Expected Discharge Plan and Services       Living arrangements for the past 2 months: Single Family Home                                      Prior Living Arrangements/Services Living arrangements for the past 2 months: Single Family Home Lives with:: Self Patient language and need for interpreter reviewed:: Yes        Need for Family Participation in Patient Care: Yes (Comment)     Criminal Activity/Legal Involvement Pertinent to Current Situation/Hospitalization: No - Comment as needed  Activities of Daily Living      Permission Sought/Granted  Permission granted to share information with : Yes, Verbal Permission Granted  Share Information with NAME: Walter Vargas           Emotional Assessment Appearance:: Appears stated age Attitude/Demeanor/Rapport: Engaged Affect (typically observed): Appropriate Orientation: : Oriented to Self, Oriented to Situation, Oriented to Place, Oriented to  Time Alcohol / Substance Use: Not Applicable Psych Involvement: No (comment)  Admission diagnosis:  Fall Patient Active Problem List   Diagnosis Date Noted   Status post carotid endarterectomy 04/25/2023   Acute ischemic left middle cerebral artery (MCA) stroke (HCC) 04/19/2023   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, without accompanying behavioral or psychological disturbance (HCC) 04/04/2023   Pressure injury of skin 03/20/2023   Debility 03/20/2023   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    Postoperative shock, cardiogenic (HCC) 03/09/2023   Protein-calorie malnutrition, severe 03/03/2023   Cardiogenic shock (HCC) 03/01/2023   COVID-19 virus infection 02/18/2023   Heart failure (HCC) 02/14/2023   AKI (acute kidney injury) (HCC) 02/13/2023   Acute exacerbation of CHF (congestive heart failure) (HCC) 02/10/2023   Non-ST elevation (NSTEMI) myocardial infarction (HCC) 02/10/2023   Thickening of wall of gallbladder 02/10/2023   Elevated LFTs 02/10/2023   Chronic diastolic congestive heart failure (HCC) 02/05/2023   Pleural effusion 10/19/2022   Elevated brain natriuretic peptide (BNP) level 10/19/2022   Dyspnea on exertion 10/12/2022   Lower extremity edema 10/12/2022   Elevated blood pressure reading 06/21/2022   Callus 06/21/2022   Umbilical hernia without obstruction and without gangrene 06/21/2022   Impacted cerumen of right ear 05/26/2021   Chronic pain of both knees 05/26/2021   Preventative health care 05/26/2021   Screening for colon cancer 05/26/2021   PCP:  [name], NP Pharmacy:   [phone] [address], [place] - [address] [place] [address] Phone: [phone] Fax: [phone]  [facility] [address] [place] [address] Phone: [phone] Fax: [phone]     Social Drivers of Health (SDOH) Social History: SDOH Screenings   Food Insecurity: No Food Insecurity (04/26/2023)  Housing: Low Risk  (04/26/2023)  Transportation Needs: No Transportation Needs (04/26/2023)  Utilities: Not At Risk (04/26/2023)  Depression (PHQ2-9): Low Risk  (10/19/2022)  Social Connections: Patient Unable To Answer (04/26/2023)  Tobacco Use: Medium Risk (05/09/2023)   SDOH Interventions:     Readmission Risk Interventions    03/20/2023   12:12 PM  Readmission Risk Prevention Plan  Transportation Screening Complete  Medication Review [employer]) Complete  HRI or Home Care Consult Complete  SW  Recovery Care/Counseling Consult Complete  Palliative Care Screening Not Applicable  Skilled Nursing Facility Not Applicable

## 2023-05-25 NOTE — ED Notes (Signed)
 Pt was given breakfast tray and it was set up at bedside

## 2023-05-25 NOTE — Discharge Instructions (Signed)
 Please use your walker at home to prevent any future falls.  You have some sign of fluid buildup in your body and I would like to make sure you follow-up with your primary care provider in a couple days for ongoing outpatient assessment.

## 2023-05-25 NOTE — Evaluation (Signed)
 Occupational Therapy Evaluation Patient Details Name: Walter Vargas MRN: [id number] DOB: 10/07/1945 Today's Date: 05/25/2023   History of Present Illness   pt is a 78 year old male who presents to the ED after a fall. 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  He was treated by wound care for a deep tissue pressure injury to his  sacrum. He was eventually stable for transfer to CIR on 03/20/2023. He had increasing agitation, confusion, and ongoing cognitive decline. Work up revealed he had a CVA while at CIR (acute ischemic left middle cerebral artery (MCA) stroke. Finally, during his work up for CABG, he was found to have an asymptomatic left ICA stenosis 80 to 99%.He had a left carotid endarterectomy with bovine pericardial patch angioplasty on 04/25/2023. He was discharged to SNF on 05/04/2023, recently discharged from SNF per chart     Clinical Impressions Chart reviewed, pt greeted in ED stretcher hallway. Pt is oriented to self and place, pt is impulsive throughout with poor safety awareness and poor awareness of deficits. PTA pt reports he is MOD I in all ADL/IADL however is a poor historian and recently from STR. Discussed need to confirm PLOF with TOC. Pt presents with deficits in strength, endurance, activity tolerance, balance, cognition affecting safe and optimal ADL completion. [name] CGA required for bed mobility, MOD A for LB dressing, MIN A for STS, amb in hallway with RW with MIN A +2 for safety. Toilet transfer completed with MIN A, toileting with MAX A as pt is incontinent of BM/urine. Noted scabs on legs, bruising on sacrum, nurse notified. Pt will benefit from acute OT to address deficits and to facilitate optimal ADL performance. OT will continue to follow.      If plan is discharge home, recommend the following:   A little help with walking  and/or transfers;A little help with bathing/dressing/bathroom;Assistance with cooking/housework;Assist for transportation;Help with stairs or ramp for entrance;Direct supervision/assist for financial management;Direct supervision/assist for medications management;Supervision due to cognitive status     Functional Status Assessment   Patient has had a recent decline in their functional status and demonstrates the ability to make significant improvements in function in a reasonable and predictable amount of time.     Equipment Recommendations   Other (comment) (defer to next venue of care)     Recommendations for Other Services         Precautions/Restrictions   Precautions Precautions: Fall Recall of Precautions/Restrictions: Impaired Precaution/Restrictions Comments: impulsive Restrictions Weight Bearing Restrictions Per Provider Order: No     Mobility Bed Mobility Overal bed mobility: Needs Assistance Bed Mobility: Supine to Sit, Sit to Supine     Supine to sit: Contact guard Sit to supine: Supervision        Transfers Overall transfer level: Needs assistance Equipment used: Rolling walker (2 wheels) Transfers: Sit to/from Stand Sit to Stand: Min assist, +2 safety/equipment                  Balance Overall balance assessment: Needs assistance Sitting-balance support: Feet supported Sitting balance-Leahy Scale: Fair     Standing balance support: Bilateral upper extremity supported, During functional activity, Reliant on assistive device for balance Standing balance-Leahy Scale: Poor  ADL either performed or assessed with clinical judgement   ADL Overall ADL's : Needs assistance/impaired             Lower Body Bathing: Moderate assistance;Maximal assistance;Sit to/from stand   Upper Body Dressing : Minimal assistance;Sitting Upper Body Dressing Details (indicate cue type and reason): donn/doff  gown Lower Body Dressing: Moderate assistance;Sitting/lateral leans   Toilet Transfer: Minimal assistance;Ambulation;Rolling walker (2 wheels)   Toileting- Clothing Manipulation and Hygiene: Maximal assistance;Sit to/from stand Toileting - Clothing Manipulation Details (indicate cue type and reason): incontinent of BM/urine     Functional mobility during ADLs: Minimal assistance;Rolling walker (2 wheels);+2 for safety/equipment;Cueing for safety General ADL Comments: Impulsive throughout     Vision Patient Visual Report: No change from baseline       Perception         Praxis         Pertinent Vitals/Pain Pain Assessment Pain Assessment: No/denies pain     Extremity/Trunk Assessment Upper Extremity Assessment Upper Extremity Assessment: Generalized weakness   Lower Extremity Assessment Lower Extremity Assessment: Generalized weakness   Cervical / Trunk Assessment Cervical / Trunk Assessment: Kyphotic   Communication Communication Communication: Impaired Factors Affecting Communication: Hearing impaired   Cognition Arousal: Alert Behavior During Therapy: WFL for tasks assessed/performed Cognition: Cognition impaired   Orientation impairments: Time, Situation Awareness: Intellectual awareness impaired, Online awareness impaired Memory impairment (select all impairments): Declarative long-term memory, Working memory Attention impairment (select first level of impairment): Sustained attention Executive functioning impairment (select all impairments): Initiation, Organization, Sequencing, Reasoning, Problem solving OT - Cognition Comments: Pt thinks it is 1994, states he is in the hospital                 Following commands: Impaired Following commands impaired: Follows one step commands with increased time     Cueing  General Comments   Cueing Techniques: Verbal cues;Gestural cues  vss on RA; impulsive throughout   Exercises Other Exercises Other  Exercises: edu re: role of OT, role of rehab   Shoulder Instructions      Home Living Family/patient expects to be discharged to:: Private residence Living Arrangements: Alone Available Help at Discharge: Family;Available PRN/intermittently Type of Home: Apartment Home Access: Stairs to enter Entrance Stairs-Number of Steps: 3 Entrance Stairs-Rails: Right Home Layout: One level     Bathroom Shower/Tub: Chief Strategy Officer: Standard     Home Equipment: Rollator (4 wheels)   Additional Comments: Per chart review, lives alone; sister and brother-in-law (in good shape) live in [place], state they're retired and plan to stay with pt for initial 24/7 assist at d/c -------- > per chart      Prior Functioning/Environment Prior Level of Function : History of Falls (last six months);Driving;Independent/Modified Independent;Patient poor historian/Family not available             Mobility Comments: pt reports amb with no AD but has a RW, states he is still driving; will need to confirm ADLs Comments: pt reports he has been performing all ADLs with MOD I, drives to pharmacy/grocery store. Per chart recently discharged from rehab a few days ago. TOC contacted re: confiming PLOF    OT Problem List: Impaired balance (sitting and/or standing);Decreased cognition;Decreased safety awareness;Decreased activity tolerance;Decreased strength;Decreased knowledge of use of DME or AE   OT Treatment/Interventions: Self-care/ADL training;Patient/family education;Balance training;DME and/or AE instruction;Energy conservation      OT Goals(Current goals can be found in the care plan section)   Acute Rehab OT  Goals Patient Stated Goal: go home OT Goal Formulation: With patient Time For Goal Achievement: 06/08/23 Potential to Achieve Goals: Fair ADL Goals Pt Will Perform Lower Body Dressing: with supervision;sit to/from stand Pt Will Transfer to Toilet: with  supervision;ambulating;regular height toilet Pt Will Perform Toileting - Clothing Manipulation and hygiene: with supervision;sit to/from stand;sitting/lateral leans Additional ADL Goal #1: pt will participate in further cognitive assessment/pill box assessment to facilitate improved IADL participation   OT Frequency:  Min 2X/week    Co-evaluation PT/OT/SLP Co-Evaluation/Treatment: Yes Reason for Co-Treatment: Necessary to address cognition/behavior during functional activity;For patient/therapist safety;To address functional/ADL transfers PT goals addressed during session: Mobility/safety with mobility;Balance;Proper use of DME OT goals addressed during session: ADL's and self-care      AM-PAC OT "6 Clicks" Daily Activity     Outcome Measure Help from another person eating meals?: None Help from another person taking care of personal grooming?: None Help from another person toileting, which includes using toliet, bedpan, or urinal?: A Lot Help from another person bathing (including washing, rinsing, drying)?: A Lot Help from another person to put on and taking off regular upper body clothing?: A Little Help from another person to put on and taking off regular lower body clothing?: A Lot 6 Click Score: 17   End of Session Equipment Utilized During Treatment: Rolling walker (2 wheels) Nurse Communication: Mobility status  Activity Tolerance: Patient tolerated treatment well Patient left: in bed;with call bell/phone within reach (ED stretcher in hallway)  OT Visit Diagnosis: Unsteadiness on feet (R26.81);Other abnormalities of gait and mobility (R26.89);Muscle weakness (generalized) (M62.81);Other symptoms and signs involving cognitive function                Time: 1610-9604 OT Time Calculation (min): 17 min Charges:  OT General Charges $OT Visit: 1 Visit OT Evaluation $OT Eval Moderate Complexity: 1 Mod  Oleta Mouse, OTD OTR/L  05/25/23, 12:47 PM

## 2023-05-25 NOTE — ED Notes (Signed)
 Cleaned Pt wounds up with saline and wrapped both R wrist and R elbow at this time, bleeding controlled

## 2023-05-25 NOTE — Progress Notes (Signed)
 Patient in hospital   Walter Vargas  [facility]  [facility], [facility] Guide  Direct Dial: [phone]  Fax [phone]

## 2023-05-25 NOTE — ED Notes (Signed)
 PT was wet, brief changed and new bedding placed.

## 2023-05-25 NOTE — ED Provider Notes (Addendum)
  Physical Exam  BP (!) 116/92 (BP Location: Left Arm)   Pulse 66   Temp 97.8 F (36.6 C) (Oral)   Resp 20   Ht 5\' 8"  (1.727 m)   Wt 66.3 kg   SpO2 100%   BMI 22.22 kg/m   Physical Exam  Procedures  Procedures  ED Course / MDM   Clinical Course as of 05/25/23 0103  Fri May 25, 2023  0055 Patient able to ambulate without any difficulty. [DW]    Clinical Course User Index [DW] Janith Lima, MD   Medical Decision Making Amount and/or Complexity of Data Reviewed Labs: ordered. Radiology: ordered.   Received signout on patient.  78 year old male presenting today for fall at home.  Has had multiple falls recently due to intermittent weakness.  Seen by initial provider with largely reassuring vital signs and physical exam.  Signed out pending CT imaging.  CT imaging shows no acute traumatic injuries anywhere.  Troponin stable x 2.  Patient denies any acute symptoms at this time.  Was able to ambulate easily with a walker and no respiratory distress or tachycardia.  Patient prefers to go home at this time and does not meet any admission criteria.  I did discuss with him some of the fluid buildup seen on exam and imaging today.  Recommend he follow-up with his primary care provider within the next few days for reassessment.  He is agreeable with this plan.     Janith Lima, MD 05/25/23 0104  Addendum: When attempting to discharge patient in the morning.  A visitor showed up with his friend, [name].  He stated he is not going to take patient back to his home.  He wants social work involved.  Patient himself does not want to be in a facility.  But he leaves his decisions up to his friend, [name].  Will get social work involved.   Janith Lima, MD 05/25/23 [phone]

## 2023-05-25 NOTE — Evaluation (Signed)
 Physical Therapy Evaluation Patient Details Name: Walter Vargas MRN: [id number] DOB: Oct 28, 1945 Today's Date: 05/25/2023  History of Present Illness  Walter Vargas is a 78 y.o. male with a history of CABG, carotid endarterectomy who comes in with concerns for a fall.  Patient has had multiple falls recently.  He fell this morning around 7 AM and was not able to get up off the floor.  Clinical Impression  Patient received in hallway of ED on stretcher. He is confused. Requires a lot of cueing for safety, re-orientation. Patient requires cga for bed mobility. Transfers with min A. Patient was able to ambulate to bathroom and back with RW and +2 min A for safety/line management. Patient has poor balance and poor safety awareness. He is at high fall risk and lives alone. He will continue to benefit from skilled PT to improve safety with mobility.           If plan is discharge home, recommend the following: A lot of help with walking and/or transfers;A lot of help with bathing/dressing/bathroom   Can travel by private vehicle   No    Equipment Recommendations None recommended by PT  Recommendations for Other Services       Functional Status Assessment Patient has had a recent decline in their functional status and demonstrates the ability to make significant improvements in function in a reasonable and predictable amount of time.     Precautions / Restrictions Precautions Precautions: Fall Recall of Precautions/Restrictions: Impaired Restrictions Weight Bearing Restrictions Per Provider Order: No      Mobility  Bed Mobility Overal bed mobility: Needs Assistance Bed Mobility: Supine to Sit, Sit to Supine     Supine to sit: Contact guard Sit to supine: Supervision   General bed mobility comments: Supervision/cga for safety at all times    Transfers Overall transfer level: Needs assistance Equipment used: Rolling walker (2 wheels) Transfers: Sit to/from Stand Sit to Stand:  Min assist           General transfer comment: MinA for steadying, cues for sequencing and technique    Ambulation/Gait Ambulation/Gait assistance: Min assist, +2 safety/equipment Gait Distance (Feet): 40 Feet Assistive device: Rolling walker (2 wheels) Gait Pattern/deviations: Step-through pattern, Decreased stride length, Trunk flexed, Decreased step length - right, Decreased step length - left Gait velocity: Decreased     General Gait Details: MinA for RW management, pt has difficulty staying close to [employer]     Tilt Bed    Modified Rankin (Stroke Patients Only)       Balance Overall balance assessment: Needs assistance Sitting-balance support: Feet supported Sitting balance-Leahy Scale: Fair Sitting balance - Comments: Supervision needed for patient sitting on commode Postural control: Posterior lean Standing balance support: Bilateral upper extremity supported, During functional activity, Reliant on assistive device for balance Standing balance-Leahy Scale: Poor Standing balance comment: reliant on UE support and external assist                             Pertinent Vitals/Pain Pain Assessment Pain Assessment: No/denies pain Pain Intervention(s): Monitored during session    Home Living Family/patient expects to be discharged to:: Skilled nursing facility Living Arrangements: Alone Available Help at Discharge: Family;Available PRN/intermittently Type of Home: Apartment Home Access: Stairs to enter Entrance Stairs-Rails: Right Entrance Stairs-Number of Steps: 3   Home Layout: One level  Home Equipment: Rollator (4 wheels) Additional Comments: Per chart review, lives alone; sister and brother-in-law (in good shape) live in [place], state they're retired and plan to stay with pt for initial 24/7 assist at d/c -------- > per chart    Prior Function Prior Level of Function : History of Falls (last six  months);Driving;Independent/Modified Independent;Patient poor historian/Family not available             Mobility Comments: patient reports he does not use RW at baseline, has one. States he is still driving. Unsure of history.       Extremity/Trunk Assessment   Upper Extremity Assessment Upper Extremity Assessment: Defer to OT evaluation    Lower Extremity Assessment Lower Extremity Assessment: Generalized weakness    Cervical / Trunk Assessment Cervical / Trunk Assessment: Kyphotic  Communication   Communication Communication: Impaired Factors Affecting Communication: Hearing impaired    Cognition Arousal: Alert Behavior During Therapy: WFL for tasks assessed/performed   PT - Cognitive impairments: No family/caregiver present to determine baseline, Orientation, Awareness, Memory, Attention, Initiation, Problem solving, Safety/Judgement, Sequencing   Orientation impairments: Time, Situation                   PT - Cognition Comments: States he is at [facility], knows he fell. Following commands: Impaired Following commands impaired: Follows one step commands with increased time     Cueing Cueing Techniques: Verbal cues, Gestural cues     General Comments      Exercises     Assessment/Plan    PT Assessment Patient needs continued PT services  PT Problem List Decreased strength;Decreased range of motion;Decreased activity tolerance;Decreased balance;Decreased mobility;Decreased knowledge of use of DME;Decreased cognition;Decreased safety awareness;Decreased knowledge of precautions;Decreased coordination;Decreased skin integrity       PT Treatment Interventions DME instruction;Gait training;Stair training;Functional mobility training;Therapeutic activities;Therapeutic exercise;Balance training;Patient/family education;Cognitive remediation;Neuromuscular re-education    PT Goals (Current goals can be found in the Care Plan section)  Acute Rehab PT  Goals Patient Stated Goal: none stated PT Goal Formulation: Patient unable to participate in goal setting Time For Goal Achievement: 06/08/23    Frequency Min 2X/week     Co-evaluation PT/OT/SLP Co-Evaluation/Treatment: Yes Reason for Co-Treatment: Necessary to address cognition/behavior during functional activity;For patient/therapist safety;To address functional/ADL transfers PT goals addressed during session: Mobility/safety with mobility;Balance;Proper use of DME         AM-PAC PT "6 Clicks" Mobility  Outcome Measure Help needed turning from your back to your side while in a flat bed without using bedrails?: A Little Help needed moving from lying on your back to sitting on the side of a flat bed without using bedrails?: A Little Help needed moving to and from a bed to a chair (including a wheelchair)?: A Lot Help needed standing up from a chair using your arms (e.g., wheelchair or bedside chair)?: A Lot Help needed to walk in hospital room?: A Lot Help needed climbing 3-5 steps with a railing? : Total 6 Click Score: 13    End of Session Equipment Utilized During Treatment: Gait belt Activity Tolerance: Patient tolerated treatment well Patient left: in bed;with bed alarm set Nurse Communication: Mobility status PT Visit Diagnosis: Other abnormalities of gait and mobility (R26.89);Difficulty in walking, not elsewhere classified (R26.2);Muscle weakness (generalized) (M62.81);Unsteadiness on feet (R26.81);Repeated falls (R29.6)    Time: 8295-6213 PT Time Calculation (min) (ACUTE ONLY): 33 min   Charges:   PT Evaluation $PT Eval Moderate Complexity: 1 Mod PT Treatments $Gait Training: 8-22 mins PT  General Charges $$ ACUTE PT VISIT: 1 Visit         Gus Littler, PT, GCS 05/25/23,11:56 AM

## 2023-05-25 NOTE — NC FL2 (Addendum)
 [place] MEDICAID FL2 LEVEL OF CARE FORM     IDENTIFICATION  Patient Name: Walter Vargas Birthdate: Jun 09, 1945 Sex: male Admission Date (Current Location): 05/24/2023  [facility] and [place] Number:  Chiropodist and Address:  [facility], [address] [place], [place] [address]      Provider Number: [id number]  Attending Physician Name and Address:  Delton Prairie, MD  Relative Name and Phone Number:       Current Level of Care: Hospital Recommended Level of Care: Skilled Nursing Facility Prior Approval Number:    Date Approved/Denied:   PASRR Number: [id number]  Discharge Plan: SNF    Current Diagnoses: Patient Active Problem List   Diagnosis Date Noted   Status post carotid endarterectomy 04/25/2023   Acute ischemic left middle cerebral artery (MCA) stroke (HCC) 04/19/2023   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, without accompanying behavioral or psychological disturbance (HCC) 04/04/2023   Pressure injury of skin 03/20/2023   Debility 03/20/2023   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   Postoperative shock, cardiogenic (HCC) 03/09/2023   Protein-calorie malnutrition, severe 03/03/2023   Cardiogenic shock (HCC) 03/01/2023   COVID-19 virus infection 02/18/2023   Heart failure (HCC) 02/14/2023   AKI (acute kidney injury) (HCC) 02/13/2023   Acute exacerbation of CHF (congestive heart failure) (HCC) 02/10/2023   Non-ST elevation (NSTEMI) myocardial infarction (HCC) 02/10/2023   Thickening of wall of gallbladder 02/10/2023   Elevated LFTs 02/10/2023   Chronic diastolic congestive heart failure (HCC) 02/05/2023   Pleural effusion 10/19/2022   Elevated brain natriuretic peptide (BNP) level 10/19/2022   Dyspnea on exertion 10/12/2022   Lower extremity edema 10/12/2022   Elevated blood pressure reading 06/21/2022   Callus 06/21/2022   Umbilical hernia without obstruction and without gangrene 06/21/2022   Impacted cerumen  of right ear 05/26/2021   Chronic pain of both knees 05/26/2021   Preventative health care 05/26/2021   Screening for colon cancer 05/26/2021    Orientation RESPIRATION BLADDER Height & Weight     Self, Place  Normal Incontinent Weight: 146 lb 2.6 oz (66.3 kg) Height:  5\' 8"  (172.7 cm)  BEHAVIORAL SYMPTOMS/MOOD NEUROLOGICAL BOWEL NUTRITION STATUS   (None)  (None) Incontinent Diet (Regular)  AMBULATORY STATUS COMMUNICATION OF NEEDS Skin   Limited Assist Verbally Skin abrasions, Bruising, Other (Comment), PU Stage and Appropriate Care (Blister, erythema/redness, scratch marks. Skin tear on left thigh (foam prn) and left arm (foam prn). This is documentation from admission in February. This has not been updated for this admission.)   PU Stage 2 Dressing: Daily (Coccyx: Foam daily per documentation from February admission.)                   Personal Care Assistance Level of Assistance  Bathing, Feeding, Dressing Bathing Assistance: Limited assistance Feeding assistance: Limited assistance Dressing Assistance: Limited assistance     Functional Limitations Info  Sight, Hearing, Speech Sight Info: Adequate Hearing Info: Adequate Speech Info: Adequate    SPECIAL CARE FACTORS FREQUENCY  PT (By licensed PT), OT (By licensed OT)     PT Frequency: 5 x week OT Frequency: 5 x week            Contractures Contractures Info: Not present    Additional Factors Info  Code Status, Allergies Code Status Info: Full code Allergies Info: NKDA           Current Medications (05/25/2023):  This is the current hospital active medication list No current  facility-administered medications for this encounter.   Current Outpatient Medications  Medication Sig Dispense Refill   amiodarone (PACERONE) 200 MG tablet Take 1 tablet (200 mg total) by mouth daily.     apixaban (ELIQUIS) 5 MG TABS tablet Take 1 tablet (5 mg total) by mouth 2 (two) times daily.     ascorbic acid (VITAMIN C) 500  MG tablet Take 1 tablet (500 mg total) by mouth daily.     aspirin 81 MG chewable tablet Chew 1 tablet (81 mg total) by mouth daily.     atorvastatin (LIPITOR) 80 MG tablet Take 1 tablet (80 mg total) by mouth daily.     digoxin (LANOXIN) 0.125 MG tablet Take 1 tablet (0.125 mg total) by mouth daily.     docusate (COLACE) 50 MG/5ML liquid Take 10 mLs (100 mg total) by mouth 2 (two) times daily.     hydrOXYzine (ATARAX) 25 MG tablet Take 1 tablet (25 mg total) by mouth 3 (three) times daily as needed for anxiety.     Multiple Vitamins-Minerals (MULTIVITAMIN MEN 50+) TABS Take 1 tablet by mouth daily.     pantoprazole (PROTONIX) 40 MG tablet Take 1 tablet (40 mg total) by mouth 2 (two) times daily before a meal.       Discharge Medications: Please see discharge summary for a list of discharge medications.  Relevant Imaging Results:  Relevant Lab Results:   Additional Information SS#: [id number]. Was at [facility] 2/21-3/10 (17 days). Looking for a facility that can work out a payment plan after rehab.  Margarito Liner, LCSW

## 2023-05-25 NOTE — ED Notes (Addendum)
 Spoke with the visitor who was here last night (mike), who was just here, I asked him if he was able to take pt home. He said he couldn't bc he was on his way to work, and that the pt doesn't need to go home bc he has been falling so much lately, and that he has no family locally so social work needs to be consulted. The visitor said that the pts son is supposed to be calling here sometime this morning to see about getting social work involved. Anner Crete, MD notified

## 2023-05-25 NOTE — ED Notes (Signed)
PT working with pt at this time

## 2023-05-25 NOTE — ED Notes (Signed)
 Ambulated pt  down the hall and back to his room with the walker pretty well. Pt said at home he said he has a walker but hasn't been using it, this RN asked him why and he said bc it's kind of flimsy.

## 2023-05-25 NOTE — Progress Notes (Signed)
 Heart Failure Navigator Progress Note  Assessed for Heart & Vascular TOC clinic readiness.  Patient does not agree to Heart Failure education or a TOC appointment.  He states, "I won't do that until I get everything cleared up". Echo on 04/20/23- EF was 20-25% and he currently has a follow-up appointment scheduled with Dr. Debbe Odea, MD on 07/26/23 @ 8:20 AM.  Navigator available for reassessment of patient. Navigator will sign off at this time.  Roxy Horseman, RN, BSN [facility] Heart Failure Navigator Secure Chat Only

## 2023-05-25 NOTE — TOC Progression Note (Signed)
 Transition of Care [facility]) - Progression Note    Patient Details  Name: Walter Vargas MRN: [id number] Date of Birth: 1945-07-16  Transition of Care [facility]) CM/SW Contact  Colin Broach, LCSW Phone Number: 05/25/2023, 10:48 AM  Clinical Narrative:     CSW phoned [facility] and lvm for Kenney Houseman asking if patient was set up with [facility] services when he left [facility] on 3/10.  Waiting to hear back.       Expected Discharge Plan and Services                                               Social Determinants of Health (SDOH) Interventions SDOH Screenings   Food Insecurity: No Food Insecurity (04/26/2023)  Housing: Low Risk  (04/26/2023)  Transportation Needs: No Transportation Needs (04/26/2023)  Utilities: Not At Risk (04/26/2023)  Depression (PHQ2-9): Low Risk  (10/19/2022)  Social Connections: Patient Unable To Answer (04/26/2023)  Tobacco Use: Medium Risk (05/09/2023)    Readmission Risk Interventions    03/20/2023   12:12 PM  Readmission Risk Prevention Plan  Transportation Screening Complete  Medication Review [facility]) Complete  HRI or Home Care Consult Complete  SW Recovery Care/Counseling Consult Complete  Palliative Care Screening Not Applicable  Skilled Nursing Facility Not Applicable

## 2023-05-25 NOTE — ED Notes (Signed)
 This RN attempted to call all emergency contacts in chart to arrange a ride home for pt, no answer from any contacts. Contacts called: Rolm Baptise (daughter), Conard Novak (sister), and Aasim Restivo (son).

## 2023-05-25 NOTE — Progress Notes (Addendum)
 Heart Failure Stewardship Pharmacy Note  PCP: [name], NP PCP-Cardiologist: None  HPI: Walter Vargas is a 78 y.o. 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  Hospitalized 01/2023 with CHF/NSTEMI and underwent LHC showing severe multivessel disease. Echocardiogram at that time showed LVEF <20% with moderately reduced RV function, LA mild-mod dilated, RA mildly dilated, MV mod-Severe MR. RHC showed CO 3.5, CO 2, RA 2, PA 25/10 (15) mmHg, PCWP 12 mmHg. Transferred to [facility], referred to cardiothoracic surgery. CABG postponed to 03/01/23 due to COVID. Underwent CABG with Impella 5.5 insertion on 03/01/23. Had high chest tube output and went to OR for re-exploration. ICU stay complicated by delerium. Discharged on Farxiga 10 mg daily, digoxin 0.125 mg daily, losartan 25 mg daily, and spironolactone 25 mg daily. 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 Most recent echo showed LVEF of 20-25% with global hypokinesis, septal-lateral dyssynchrony, grade II diastolic dysfunction, mildly reduced RV function, moderately dialted LA, mildly dilated RA, mild MR.   On presentation to the ED, HS-troponin was 30 > 33. No BNP measured.    Pertinent Lab Values: Creatinine, Ser  Date Value Ref Range Status  05/24/2023 0.69 0.61 - 1.24 mg/dL Final   BUN  Date Value Ref Range Status  05/24/2023 18 8 - 23 mg/dL Final   Potassium  Date Value Ref Range Status  05/24/2023 4.0 3.5 - 5.1 mmol/L Final   Sodium  Date Value Ref Range Status  05/24/2023 137 135 - 145 mmol/L Final   B Natriuretic Peptide  Date Value Ref Range Status  02/10/2023 >4,500.0 (H) 0.0 - 100.0 pg/mL Final    Comment:    Performed at [facility], [address] [place], [place] [address]   Magnesium  Date Value Ref Range Status  04/17/2023 1.7 1.7 - 2.4 mg/dL Final    Comment:    Performed at [facility]  Lab, [address] [place], [place] [address]   Hgb A1c MFr Bld  Date Value Ref Range Status  02/28/2023 5.9 (H) 4.8 - 5.6 % Final    Comment:    (NOTE) Pre diabetes:          5.7%-6.4%  Diabetes:              >6.4%  Glycemic control for   <7.0% adults with diabetes    Digoxin Level  Date Value Ref Range Status  03/16/2023 0.6 (L) 0.8 - 2.0 ng/mL Final    Comment:    Performed at [facility], [address], [place], [place] [address]   TSH  Date Value Ref Range Status  04/17/2023 6.534 (H) 0.350 - 4.500 uIU/mL Final    Comment:    Performed by a 3rd Generation assay with a functional sensitivity of <=0.01 uIU/mL. Performed at [facility], [address] [place], [place] [address]    LDH  Date Value Ref Range Status  03/15/2023 328 (H) 98 - 192 U/L Final    Comment:    Performed at [facility], [address], [place], [place] [address]    Vital Signs:  Temp:  [97.8 F (36.6 C)-97.9 F (36.6 C)] 97.8 F (36.6 C) (03/14 0530) Pulse Rate:  [58-71] 58 (03/14 0530) Resp:  [13-20] 13 (03/14 0530) BP: (110-124)/(54-92) 110/54 (03/14 0530) SpO2:  [99 %-100 %] 99 % (03/14 0530) Weight:  [  66.3 kg (146 lb 2.6 oz)] 66.3 kg (146 lb 2.6 oz) (03/13 2046) No intake or output data in the 24 hours ending 05/25/23 1408  Current Heart Failure Medications:  Loop diuretic: none Beta-Blocker: none ACEI/ARB/ARNI: none MRA: none SGLT2i: none Other: none  Prior to admission Heart Failure Medications:  Loop diuretic: none Beta-Blocker: none ACEI/ARB/ARNI: none MRA: none SGLT2i: none Other: digoxin 0.125 mg daily  Assessment: 1. 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 NYHA class I-II symptoms.  -Symptoms: Reports being asymptomatic at this time. Patient has delirium and thinks there is a refrigerator attached to the wall next to his hallway bed. -Volume: Appears euvolemic. No longer taking diuretics at  home.  -Hemodynamics: BP is ok. HR 50s.  Plan: 1) Medication changes recommended at this time: -Discharge orders have already been entered. Noted referral to advanced heart failure clinic by [facility]. Patient scheduled with follow up appointment. -Check digoxin level. With HR in 50s and falls, worry it may be high.  2) Patient assistance: -Pending  3) Education: - Patient has been educated on current HF medications and potential additions to HF medication regimen - Patient verbalizes understanding that over the next few months, these medication doses may change and more medications may be added to optimize HF regimen - Patient has been educated on basic disease state pathophysiology and goals of therapy  Please do not hesitate to reach out with questions or concerns,  Enos Fling, PharmD, CPP, BCPS Heart Failure Pharmacist  Phone - [phone] 05/25/2023 3:07 PM

## 2023-05-25 NOTE — ED Notes (Signed)
 Pt changed into gown, peri care done. This RN noted significant bruising to pts buttock, provider Fuller Plan, MD notified and at bedside to evaluate.

## 2023-05-26 LAB — DIGOXIN LEVEL: Digoxin Level: <0.2 ng/mL — ABNORMAL LOW (ref 0.8–2.0)

## 2023-05-26 NOTE — ED Notes (Addendum)
 Patient repositioned in bed. Sat patient up to eat lunch.

## 2023-05-26 NOTE — ED Notes (Addendum)
 RN assisted patient to call daughter on personal cell phone. Daughter was reached and patient was able to communicate with family. Patient is asking for a professors number at [facility] [employer] that he wants to call. Only contacts in patients cell phone are Kym Groom and Rolm Baptise.

## 2023-05-26 NOTE — ED Notes (Signed)
 Patient repositioned in bed. Gave strawberry ice cream per patient request.

## 2023-05-26 NOTE — ED Notes (Signed)
 Lab called RN to alert RN to error in lab results. Digoxin level was <0.2 at 2114 on 03/14. Lab correcting chart.

## 2023-05-26 NOTE — ED Provider Notes (Signed)
 Emergency Medicine Observation Re-evaluation Note  Physical Exam   BP (!) 112/51 (BP Location: Left Arm)   Pulse 68   Temp 97.8 F (36.6 C) (Oral)   Resp 18   Ht 5\' 8"  (1.727 m)   Wt 66.3 kg   SpO2 97%   BMI 22.22 kg/m   Patient appears in no acute distress.  ED Course / MDM   No reported events during my shift at the time of this note.   Pt is awaiting dispo from consultants   Pilar Jarvis MD    Pilar Jarvis, MD 05/26/23 [phone]

## 2023-05-26 NOTE — ED Notes (Signed)
 Pt incontinence care provided, urine and small BM. Repositioned for comfort and heels floated. Given pillow and blanket for comfort.

## 2023-05-27 NOTE — ED Notes (Addendum)
 Assisted pt to bathroom via wheelchair, and then assisted to stand and sit on the toilet. Pt was very unsteady on his feet. Pt was washed clean and mepilex dressing applied to pt's buttocks for skin breakdown in the area. Moisture barrier cream also applied. Pt's gown, bed sheets, and blankets changed.

## 2023-05-27 NOTE — ED Notes (Signed)
 Pt heard and seen coughing more than normal with his food. Pt vomited shortly after.

## 2023-05-27 NOTE — Progress Notes (Addendum)
 CSW attempted to contact [place] to present bed offers. Left HIPAA Compliant voicemail requesting a call back.   Addend @ 3:05 PM CSW presented bed offers to pt who stated "I'm too tired right now, I'm just trying to go to bed." CSW informed that she is trying to present offers so that we can attempt to place for rehab. CSW did inquire about whether he preferred team speak with Markham Jordan (son) and he stated "he's hard to get in touch with. He lives in [place]." When CSW informed bed offers [facility]- pt responded "I'll need to talk to to [place] about that." CSW informed that team will follow up tomorrow regarding bed choice.   CSW noted that pt prefers [facility] and informed pt that they have not offered at this time.   Addend @ 3:47 PM CSW received call back from [place]. CSW presented current bed offers and advised to review [email or web address] Markham Jordan states he would like to speak with his dad, sister, and others before making a decision. He hopes to create a payment plan at whichever facility to cover copay days if pt is there longer than 4 days. TOC following.

## 2023-05-27 NOTE — ED Notes (Signed)
Pt sat up to eat breakfast

## 2023-05-27 NOTE — ED Notes (Signed)
 Pt declining to be checked to see if brief is dry.

## 2023-05-27 NOTE — ED Notes (Signed)
 Pt sitting up in bed, assisted with gown and given warm blanket. Denies other needs

## 2023-05-27 NOTE — ED Notes (Signed)
 Pts brief changed and new bed linen placed on bed. Pt repositioned in bed and given warm blanket

## 2023-05-28 NOTE — ED Notes (Signed)
 Patient eating dinner tray. Friend at bedside visiting patient.

## 2023-05-28 NOTE — ED Notes (Addendum)
 Patient repositioned in bed at this time. Patient denying any needs at this time. Pt refusing all meds. NAD noted.

## 2023-05-28 NOTE — Evaluation (Signed)
 Clinical/Bedside Swallow Evaluation Patient Details  Name: Walter Vargas MRN: [id number] Date of Birth: Apr 21, 1945  Today's Date: 05/28/2023 Time: SLP Start Time (ACUTE ONLY): 0920 SLP Stop Time (ACUTE ONLY): 0940 SLP Time Calculation (min) (ACUTE ONLY): 20 min  Past Medical History:  Past Medical History:  Diagnosis Date   Essential hypertension    HFrEF (heart failure with reduced ejection fraction) (HCC)    Mitral regurgitation    Past Surgical History:  Past Surgical History:  Procedure Laterality Date   CORONARY ARTERY BYPASS GRAFT N/A 03/01/2023   Procedure: CORONARY ARTERY BYPASS GRAFTING X 5, USING LEFT INTERNAL MAMMARY ARTERY AND ENDOSCOPICALLY HARVESTED RIGHT SAPHENOUS VEIN GRAFT;  Surgeon: [name], MD;  Location: [facility] OR;  Service: Open Heart Surgery;  Laterality: N/A;   ENDARTERECTOMY Left 04/25/2023   Procedure: ENDARTERECTOMY CAROTID USING 1X6 CM XENOSURE BIOLOGIC PATCH;  Surgeon: Leonie Douglas, MD;  Location: [facility] OR;  Service: Vascular;  Laterality: Left;   EXPLORATION POST OPERATIVE OPEN HEART N/A 03/01/2023   Procedure: EXPLORATION POST OPERATIVE OPEN HEART;  Surgeon: [name], MD;  Location[facility] OR;  Service: Open Heart Surgery;  Laterality: N/A;   NO PAST SURGERIES     PLACEMENT OF IMPELLA LEFT VENTRICULAR ASSIST DEVICE  03/01/2023   Procedure: PLACEMENT OF IMPELLA LEFT VENTRICULAR ASSIST DEVICE;  Surgeon: [name], MD;  Location: [facility] OR;  Service: Open Heart Surgery;;   REMOVAL OF IMPELLA LEFT VENTRICULAR ASSIST DEVICE N/A 03/12/2023   Procedure: REMOVAL OF IMPELLA LEFT VENTRICULAR ASSIST DEVICE;  Surgeon: [name], MD;  Location: [facility] OR;  Service: Open Heart Surgery;  Laterality: N/A;   RIGHT/LEFT HEART CATH AND CORONARY ANGIOGRAPHY N/A 02/13/2023   Procedure: RIGHT/LEFT HEART CATH AND CORONARY ANGIOGRAPHY;  Surgeon: Yvonne Kendall, MD;  Location: [facility] INVASIVE CV LAB;  Service: Cardiovascular;  Laterality: N/A;    TEE WITHOUT CARDIOVERSION N/A 03/01/2023   Procedure: TRANSESOPHAGEAL ECHOCARDIOGRAM (TEE);  Surgeon: [name], MD;  Location: [facility] OR;  Service: Open Heart Surgery;  Laterality: N/A;   TEE WITHOUT CARDIOVERSION N/A 03/12/2023   Procedure: TRANSESOPHAGEAL ECHOCARDIOGRAM (TEE);  Surgeon: [name], MD;  Location: [facility] OR;  Service: Open Heart Surgery;  Laterality: N/A;   HPI:  Pr provider note, "Walter Vargas is a 78 y.o. male with a history of CABG, carotid endarterectomy who comes in with concerns for a fall.  Patient has had multiple falls recently.  He fell this morning around 7 AM and was not able to get up off the floor.  He denies hitting his head but does report some right hip pain.  He does have some significant old looking bruise on his right buttock area.  He denies any chest pain, shortness of breath, abdominal pain.  Does report living by himself.  He states that he is walks on his own without a walker. Pt denies any alcohol or drugs." CT Head 05/22/33: Generalized cerebral atrophy with chronic white matter small vessel ischemic changes. No acute intracranial abnormality. Marked severity right ethmoid sinus and moderate severity left-sided sphenoid sinus disease.    Assessment / Plan / Recommendation  Clinical Impression  Pt with recent history of dysphagia. MBSS completed on 03/20/23, revealing, "reduced hyolaryngeal movement, laryngeal vestibule closure, pharyngeal squeeze, epiglottic deflection, and base of tongue retraction but all appearing improved somewhat from previous study. Also note posterior curvative of C spine at C4-5/6 which combined with decreased epiglottic deflection and decreased UES opening is likely impacting full tight closure  of laryngeal vestibule during the swallow. The combination of above reuslt in penetration of thin and nectar thick liquids however penetrates remained above the airway and were consistently cleared with either spontaneous dry  swallows or cued throat clear and swallow. Moderate vallecular and pyriform sinus residuals noted post swallows with solids and nectar thick liquids, reduced to mild-moderate with thin liquids. Presence of esophageal stasis noted below UES tomid esophagus could also be contributing to increased pressure and attributing to decreased UES opening and the presence of pharyngeal residue post swallow. Recommend diet upgrade to dysphagia 2 with thin liquids with strict use of precautions." Pt advanced to regular solids during stay in CIR.       Pt seen today for bedside swallow assessment in the setting of concern for choking with PO intake per nursing note. Pt on room air and independent with oral care. Pt seen with trials of thin liquids (via straw and cup) and regular solids from breakfast tray. Pt able to self feed with aid of therapist cutting up solids and prepping items. Immediate cough noted following large sip of thin liquid via straw, not repeated with further sips from a cup. Oral phase grossly intact- with complete manipulation and clearance of regular solid from oral cavity. Mild impulsivity noted, with pt taking large, consecutive bites of regular solids. Therapist prompted use of liquid wash for alteration and allowance for oral clearance. Based on age, current debility and recent dysphagia, pt is at increased risk of aspiration, therefore recommend aspiration precautions (slow rate, small bites, elevated HOB, and alert for PO intake). NO straws. Intermittent throat clear and re-swallow. Intermittent supervision to ensure compliance with aspiration precautions. Continue with current unrestricted diet. SLP will monitor for continued safety with current diet. RN aware of recommendations.  SLP Visit Diagnosis: Dysphagia, oropharyngeal phase (R13.12)    Aspiration Risk  Moderate aspiration risk    Diet Recommendation   Thin;Age appropriate regular  Medication Administration: Whole meds with puree (vs  thin)    Other  Recommendations Oral Care Recommendations: Oral care BID    Recommendations for follow up therapy are one component of a multi-disciplinary discharge planning process, led by the attending physician.  Recommendations may be updated based on patient status, additional functional criteria and insurance authorization.  Follow up Recommendations Follow physician's recommendations for discharge plan and follow up therapies      Assistance Recommended at Discharge  Intermittent supervision for safety with PO intake.   Functional Status Assessment Patient has had a recent decline in their functional status and demonstrates the ability to make significant improvements in function in a reasonable and predictable amount of time.  Frequency and Duration min 2x/week  1 week       Prognosis Prognosis for improved oropharyngeal function: Fair Barriers to Reach Goals: Time post onset (reduced insight)      Swallow Study   General Date of Onset: 05/28/23 HPI: Pr provider note, "Walter Vargas is a 78 y.o. male with a history of CABG, carotid endarterectomy who comes in with concerns for a fall.  Patient has had multiple falls recently.  He fell this morning around 7 AM and was not able to get up off the floor.  He denies hitting his head but does report some right hip pain.  He does have some significant old looking bruise on his right buttock area.  He denies any chest pain, shortness of breath, abdominal pain.  Does report living by himself.  He states that he  is walks on his own without a walker. Pt denies any alcohol or drugs." CT Head 05/22/33: Generalized cerebral atrophy with chronic white matter small vessel ischemic changes. No acute intracranial abnormality. Marked severity right ethmoid sinus and moderate severity left-sided sphenoid sinus disease. Type of Study: Bedside Swallow Evaluation Previous Swallow Assessment: MBSS completed 1/7- refer to summary for details Diet Prior to  this Study: Regular;Thin liquids (Level 0) Temperature Spikes Noted: No (Temp 98.8; WBC 6.6) Respiratory Status: Room air History of Recent Intubation: No Behavior/Cognition: Alert;Cooperative;Confused Oral Cavity Assessment: Within Functional Limits Oral Care Completed by SLP: Yes Oral Cavity - Dentition: Adequate natural dentition Vision: Functional for self-feeding Self-Feeding Abilities: Able to feed self;Needs set up Patient Positioning: Upright in bed Baseline Vocal Quality: Normal Volitional Cough: Strong Volitional Swallow: Unable to elicit    Oral/Motor/Sensory Function Overall Oral Motor/Sensory Function: Within functional limits   Ice Chips Ice chips: Not tested   Thin Liquid Thin Liquid: Impaired Presentation: Cup;Straw Oral Phase Impairments:  (none) Oral Phase Functional Implications:  (none) Pharyngeal  Phase Impairments: Cough - Immediate (immediate cough for thin liquid via straw- not repeated with trials via cup)    Nectar Thick Nectar Thick Liquid: Not tested   Honey Thick Honey Thick Liquid: Not tested   Puree Puree: Not tested   Solid     Solid: Within functional limits Presentation: Self Fed     Walter Shawniece Oyola Clapp, MS, CCC-SLP Speech Language Pathologist Rehab Services; [facility] [phone] (ascom)   Walter Vargas 05/28/2023,11:06 AM

## 2023-05-28 NOTE — Progress Notes (Signed)
 PT Cancellation Note  Patient Details Name: Walter Vargas MRN: [id number] DOB: 01-06-1946   Cancelled Treatment:    Reason Eval/Treat Not Completed: Other (comment).  Chart reviewed and attempted to see pt in the hallway of ED.  Pt ultimately refused with encouragement, noting that "it's criminal what you all are doing!  I want to leave and I'm being held against my own will".  Pt ultimately declined therapy and will re-attempt at later date/time as medically appropriate.     Nolon Bussing, PT, DPT Physical Therapist - [facility]  05/28/23, 4:02 PM

## 2023-05-28 NOTE — TOC CM/SW Note (Addendum)
 CSW lvm for [name], son,  to return call with decision of bed offer for patient.   12:09pm:  CSW lvm for [name] to return the call with bed choice.  Also phoned dtr, [name], and lvm for a return call.

## 2023-05-28 NOTE — ED Notes (Signed)
 Speech therapy at bedside with patient. Breakfast tray at bedside.

## 2023-05-28 NOTE — ED Notes (Signed)
 Attempted to administer medications to pt. Pt is refusing, stating "we are ruining his lifestyle by keeping him here against his will". Pt education of importance of taking medications provided. No acute distress noted at this time.

## 2023-05-28 NOTE — ED Notes (Addendum)
 Patient noted to be wet. Pt cleaned and placed in clean gown/brief/socks. New bedding placed. No other needs verbalized at this time.

## 2023-05-28 NOTE — ED Notes (Signed)
 Patient's brief changed at this time. Bedding clean and dry. NAD noted. No needs expressed.

## 2023-05-28 NOTE — ED Notes (Signed)
 Pts brief changed at this time, pt repositioned in bed. Blankets given

## 2023-05-29 NOTE — Progress Notes (Signed)
 PT Cancellation Note  Patient Details Name: Walter Vargas MRN: [id number] DOB: 07-Dec-1945   Cancelled Treatment:     Attempted to see pt with OT this afternoon. Pt adamantly declined, unable to convince pt to participate, nursing notified. Will re-attempt next available date/time per POC.    Jannet Askew 05/29/2023, 2:10 PM

## 2023-05-29 NOTE — ED Provider Notes (Signed)
-----------------------------------------   6:11 AM on 05/29/2023 -----------------------------------------   Blood pressure 119/70, pulse 64, temperature 98.8 F (37.1 C), resp. rate 16, height 5\' 8"  (1.727 m), weight 66.3 kg, SpO2 97%.  The patient is calm and cooperative at this time.  There have been no acute events since the last update.  Awaiting disposition plan from Social Work team.   Irean Hong, MD 05/29/23 [phone]

## 2023-05-29 NOTE — Progress Notes (Signed)
   05/29/23 1500  Spiritual Encounters  Type of Visit Initial  Care provided to: Patient  Conversation partners present during encounter Nurse  Reason for visit Routine spiritual support  OnCall Visit No   Chaplain saw patient while rounding in the ED [facility] area.  Patient seemed to be irritated about something that the Chaplain wasn't seeing or hearing.  Chaplain offered a compassionate presence and empathy.  Chaplain provided warm blankets and this seemed to soothe the patient while he was waiting.  Chaplain shared the patient's concerns with a Nurse.    Rev. Rana M. Earlene Plater, M.Div.  Chaplain Resident [facility]

## 2023-05-29 NOTE — Progress Notes (Signed)
 OT Cancellation Note  Patient Details Name: Walter Vargas MRN: [id number] DOB: 1945-07-26   Cancelled Treatment:    Reason Eval/Treat Not Completed: Patient declined, no reason specified. Despite verbal encouragement. Pt stated, "Nope I just want to rest, I have already had enough therapy with these people in here." Will attempt OT treatment session on next available date.  Glenard Haring M.S. OTR/L  05/29/23, 1:10 PM

## 2023-05-29 NOTE — ED Notes (Signed)
 Pt's sheets, diaper, pad, and gown changed. Pt pulled off condom catheter.

## 2023-05-29 NOTE — Progress Notes (Signed)
 Speech Language Pathology Treatment: Dysphagia  Patient Details Name: Walter Vargas MRN: [id number] DOB: Apr 30, 1945 Today's Date: 05/29/2023 Time: 3875-6433 SLP Time Calculation (min) (ACUTE ONLY): 35 min  Assessment / Plan / Recommendation Clinical Impression  Pt seen for ongoing assessment of swallowing. He was alert, verbally responsive and engaged in conversation w/ SLP; min declined Cognitive awareness and needed verbal cues for follow through w/ aspiration precautions/tasks. Pt did not demonstrate consistent carry-over of information. He required MOD+ assistance in sitting upright in bed stating he couldn't help push back in bed b/c of his "sore heel".  Pt is on RA; wbc wnl. Afebrile.   Pt explained general aspiration precautions and agreed verbally to the need for following them especially sitting upright for all oral intake -- fully assisted in sitting up for po's. He requested ice cream as part of the po's then fed self trials of puree/soft solids and thin liquids via cup  (NO straw as rec'd by MBSS 1-04/2023). No overt clinical s/s of aspiration were noted w/ any consistency; respiratory status remained calm and unlabored, vocal quality clear b/t trials, no cough. Oral phase appeared grossly [facility] for bolus management, mastication, and timely A-P transfer for swallowing; oral clearing achieved w/ all consistencies.  Pt required verbal cues (and visual - pointed to printed out precautions posted on wall for him) for follow through w/ the aspiration precautions of small sips, throat clear b/t bite/sips, drink slowly.    Pt has a Baseline of oropharyngeal phase Dysphagia per MBSS at recent CIR admit 1-04/2023 but appears at reduced risk for aspriation when following general aspiration precautions AND when given Supervision and cues for follow through w/ the aspiration precautions -- HOWEVER, unsure of pt's Baseline Cognition and ability to follow through w/ precautions alone w/out Supervision(see  notes from CIR admit). Recommend continue a Regular/mech soft diet for ease of soft foods w/ gravies added to moisten foods; Thin liquids via Cup. Recommend general aspiration precautions; Pills Whole in Puree; tray setup and positioning assistance for meals. REFLUX precautions.  ST services can be reconsulted if new needs while admitted. Pt appears at/close to his Baseline per chart notes and current presentation. MD/NSG updated. Precautions posted at bedside, chart.      HPI HPI: Pr provider note, "Walter Vargas is a 78 y.o. male with a history of CABG, carotid endarterectomy who comes in with concerns for a fall.  Patient has had multiple falls recently.  He fell this morning around 7 AM and was not able to get up off the floor.  He denies hitting his head but does report some right hip pain.  He does have some significant old looking bruise on his right buttock area.  He denies any chest pain, shortness of breath, abdominal pain.  Does report living by himself.  He states that he is walks on his own without a walker. Pt denies any alcohol or drugs." CT Head 05/22/33: Generalized cerebral atrophy with chronic white matter small vessel ischemic changes. No acute intracranial abnormality. Marked severity right ethmoid sinus and moderate severity left-sided sphenoid sinus disease.      SLP Plan  All goals met (he appears at his baseline)      Recommendations for follow up therapy are one component of a multi-disciplinary discharge planning process, led by the attending physician.  Recommendations may be updated based on patient status, additional functional criteria and insurance authorization.    Recommendations  Diet recommendations: Regular;Dysphagia 3 (mechanical soft);Thin liquid (cut meats/moistened)  Liquids provided via: Cup;No straw Medication Administration: Whole meds with puree Supervision: Patient able to self feed;Intermittent supervision to cue for compensatory  strategies Compensations: Minimize environmental distractions;Slow rate;Small sips/bites;Lingual sweep for clearance of pocketing;Follow solids with liquid;Clear throat intermittently Postural Changes and/or Swallow Maneuvers: Out of bed for meals;Seated upright 90 degrees;Upright 30-60 min after meal                 (Dietician f/u) Oral care BID;Staff/trained caregiver to provide oral care   Intermittent Supervision/Assistance Dysphagia, oropharyngeal phase (R13.12) (baseline per MBSS during admit 1-04/2023 at [facility])     All goals met (he appears at his baseline)        Jerilynn Som, MS, CCC-SLP Speech Language Pathologist Rehab Services; [facility] [place] [phone] (ascom) Shameer Molstad  05/29/2023, 5:26 PM

## 2023-05-30 NOTE — Progress Notes (Signed)
 Occupational Therapy Treatment Patient Details Name: Walter Vargas MRN: [id number] DOB: 1945/10/26 Today's Date: 05/30/2023   History of present illness pt is a 78 year old male who presents to the ED after a fall. 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  He was treated by wound care for a deep tissue pressure injury to his  sacrum. He was eventually stable for transfer to CIR on 03/20/2023. He had increasing agitation, confusion, and ongoing cognitive decline. Work up revealed he had a CVA while at CIR (acute ischemic left middle cerebral artery (MCA) stroke. Finally, during his work up for CABG, he was found to have an asymptomatic left ICA stenosis 80 to 99%.He had a left carotid endarterectomy with bovine pericardial patch angioplasty on 04/25/2023. He was discharged to SNF on 05/04/2023, recently discharged from SNF per chart   OT comments  Mr. Nyce was seen for OT/PT co-treatment on this date. Upon arrival to bedside, pt agreeable to tx session. OT facilitated ADL management with cueing and assist as described below. See ADL section for additional details regarding occupational performance. Pt continues to be functionally limited by decreased activity tolerance, decreased cognition, limited balance, and decreased safety awareness. Pt is progressing toward OT goals and continues to benefit from skilled OT services to maximize return to PLOF and minimize risk of future falls, injury, caregiver burden, and readmission. Will continue to follow POC as written. Discharge recommendation remains appropriate.        If plan is discharge home, recommend the following:  A little help with walking and/or transfers;A little help with bathing/dressing/bathroom;Assistance with cooking/housework;Assist for transportation;Help with stairs or ramp for entrance;Direct  supervision/assist for financial management;Direct supervision/assist for medications management;Supervision due to cognitive status   Equipment Recommendations  Other (comment) (defer to next venue of care)    Recommendations for Other Services      Precautions / Restrictions Precautions Precautions: Fall Recall of Precautions/Restrictions: Impaired Precaution/Restrictions Comments: impulsive, delayed processing Restrictions Weight Bearing Restrictions Per Provider Order: No       Mobility Bed Mobility Overal bed mobility: Needs Assistance Bed Mobility: Supine to Sit, Sit to Supine     Supine to sit: Supervision Sit to supine: Supervision   General bed mobility comments: Supervision/cga for safety at all times    Transfers Overall transfer level: Needs assistance Equipment used: Rolling walker (2 wheels) Transfers: Sit to/from Stand Sit to Stand: Min assist, +2 safety/equipment           General transfer comment: MIN A +2 for initial STS, able to progress to +1 CGA (+2 for safety t/o) during functional mobility on unit.     Balance Overall balance assessment: Needs assistance Sitting-balance support: Feet supported Sitting balance-Leahy Scale: Good Sitting balance - Comments: steady sitting, reaching inside BOS.   Standing balance support: Bilateral upper extremity supported, During functional activity, Reliant on assistive device for balance Standing balance-Leahy Scale: Fair Standing balance comment: reliant on UE support, requires cueing to avoid obstacles in hall while performing functional mobility.                           ADL either performed or assessed with clinical judgement   ADL Overall ADL's : Needs assistance/impaired Eating/Feeding: Set up;Supervision/ safety;Sitting Eating/Feeding Details (indicate cue type and reason): SLP recommending swallowing precautions and  Dys 3 diet. No straws. pt able to perform self-feeding while  semi-fowlers in bed. Educated on safe positioning and reinforced SLP aspiration precautions posted at bedside at end of session.                 Lower Body Dressing: Maximal assistance;Bed level Lower Body Dressing Details (indicate cue type and reason): MAX A to don bilat hospital socks at bed-level. this date. Pt c/o increased pain in BLE wounds L>R. Perseverates on getting bandages for wounds which appear closed, scabbed over and in various stages of healing.             Functional mobility during ADLs: Rolling walker (2 wheels);Cueing for safety;Minimal assistance;+2 for physical assistance;Contact guard assist General ADL Comments: Able to progress to CGA for safety for functional mobility during session. Able to amb a community distance with +2 for safety and RW.    Extremity/Trunk Assessment Upper Extremity Assessment Upper Extremity Assessment: Generalized weakness   Lower Extremity Assessment Lower Extremity Assessment: Generalized weakness   Cervical / Trunk Assessment Cervical / Trunk Assessment: Kyphotic    Vision Patient Visual Report: No change from baseline     Perception     Praxis     Communication Communication Communication: Impaired Factors Affecting Communication: Hearing impaired   Cognition Arousal: Alert Behavior During Therapy: WFL for tasks assessed/performed Cognition: Cognition impaired     Awareness: Intellectual awareness impaired, Online awareness impaired Memory impairment (select all impairments): Declarative long-term memory, Working memory Attention impairment (select first level of impairment): Sustained attention Executive functioning impairment (select all impairments): Initiation, Organization, Sequencing, Reasoning, Problem solving OT - Cognition Comments: Decreased safety awareness, decreased awareness of deficits, and slow processing appreciated t/o functional activity. Requires cuing for safety/re-direction to tasks.                  Following commands: Impaired Following commands impaired: Follows one step commands with increased time      Cueing   Cueing Techniques: Verbal cues, Gestural cues, Tactile cues  Exercises Other Exercises Other Exercises: OT facilitated ADL management with edu and assist as described above.    Shoulder Instructions       General Comments      Pertinent Vitals/ Pain       Pain Assessment Pain Assessment: No/denies pain  Home Living Family/patient expects to be discharged to:: Private residence                                        Prior Functioning/Environment              Frequency  Min 2X/week        Progress Toward Goals  OT Goals(current goals can now be found in the care plan section)  Progress towards OT goals: Progressing toward goals  Acute Rehab OT Goals Patient Stated Goal: to go home OT Goal Formulation: With patient Time For Goal Achievement: 06/08/23 Potential to Achieve Goals: Fair  Plan      Co-evaluation    PT/OT/SLP Co-Evaluation/Treatment: Yes Reason for Co-Treatment: Necessary to address cognition/behavior during functional activity;For patient/therapist safety;To address functional/ADL transfers PT goals addressed during session: Mobility/safety with mobility;Balance;Proper use of DME OT goals addressed during session: ADL's and self-care      AM-PAC OT "6 Clicks" Daily Activity     Outcome Measure   Help from another person eating meals?: None Help from  another person taking care of personal grooming?: None Help from another person toileting, which includes using toliet, bedpan, or urinal?: A Lot Help from another person bathing (including washing, rinsing, drying)?: A Lot Help from another person to put on and taking off regular upper body clothing?: A Little Help from another person to put on and taking off regular lower body clothing?: A Lot 6 Click Score: 17    End of Session Equipment  Utilized During Treatment: Rolling walker (2 wheels)  OT Visit Diagnosis: Unsteadiness on feet (R26.81);Other abnormalities of gait and mobility (R26.89);Muscle weakness (generalized) (M62.81);Other symptoms and signs involving cognitive function   Activity Tolerance Patient tolerated treatment well   Patient Left in bed;with call bell/phone within reach (ED stretcher in hallway)   Nurse Communication Mobility status        Time: 4098-1191 OT Time Calculation (min): 23 min  Charges: OT General Charges $OT Visit: 1 Visit OT Treatments $Self Care/Home Management : 8-22 mins  Rockney Ghee, M.S., OTR/L 05/30/23, 12:35 PM

## 2023-05-30 NOTE — Progress Notes (Signed)
 Physical Therapy Treatment Patient Details Name: Walter Vargas MRN: [id number] DOB: February 25, 1946 Today's Date: 05/30/2023   History of Present Illness pt is a 78 year old male who presents to the ED after a fall. 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  He was treated by wound care for a deep tissue pressure injury to his  sacrum. He was eventually stable for transfer to CIR on 03/20/2023. He had increasing agitation, confusion, and ongoing cognitive decline. Work up revealed he had a CVA while at CIR (acute ischemic left middle cerebral artery (MCA) stroke. Finally, during his work up for CABG, he was found to have an asymptomatic left ICA stenosis 80 to 99%.He had a left carotid endarterectomy with bovine pericardial patch angioplasty on 04/25/2023. He was discharged to SNF on 05/04/2023, recently discharged from SNF per chart    PT Comments  Patient agreeable to PT/OT co-treatment this date. Patient able to complete bed mobility with supervision, inc time required and some re-direction to task. Patient able to complete STS from EOB with MIN A +2. Patient able to ambulate approx 200 ft with RW, initial MIN A +2 required progressing to CGA +1 with increased distance. Patient will continue to benefit from acute skilled PT services to address impairments and maximize functional mobility. Will continue to follow acutely.     If plan is discharge home, recommend the following: A lot of help with walking and/or transfers;A lot of help with bathing/dressing/bathroom   Can travel by private vehicle     No  Equipment Recommendations  Other (comment) (TBD at next level of care)    Recommendations for Other Services       Precautions / Restrictions Precautions Precautions: Fall Recall of Precautions/Restrictions: Impaired Precaution/Restrictions Comments:  impulsive, delayed processing Restrictions Weight Bearing Restrictions Per Provider Order: No     Mobility  Bed Mobility Overal bed mobility: Needs Assistance Bed Mobility: Supine to Sit, Sit to Supine     Supine to sit: Supervision Sit to supine: Supervision   General bed mobility comments: supervision for safety; inc time required. some redirection to task needed    Transfers Overall transfer level: Needs assistance Equipment used: Rolling walker (2 wheels) Transfers: Sit to/from Stand Sit to Stand: Min assist, +2 safety/equipment                Ambulation/Gait Ambulation/Gait assistance: Min assist, +2 safety/equipment Gait Distance (Feet): 200 Feet Assistive device: Rolling walker (2 wheels) Gait Pattern/deviations: Step-through pattern, Decreased stride length, Trunk flexed, Decreased step length - right, Decreased step length - left Gait velocity: Decreased     General Gait Details: Initially with ambulation with RW, require MIN A +2, but with increased distance progressing to CGA +1 (+2 on standby for safety). Able to tolerate approx 200 ft ambulation. Pt require cues to keep AD in close proximity for improved safety. Pt also require verbal cues/intermittent assist to avoid obstacles in hallways with ambulation   Stairs             Wheelchair Mobility     Tilt Bed    Modified Rankin (Stroke Patients Only)       Balance Overall balance assessment: Needs assistance Sitting-balance support: Feet supported Sitting balance-Leahy Scale: Good     Standing balance support: Bilateral upper extremity supported, During functional activity, Reliant on assistive device for balance  Standing balance-Leahy Scale: Fair Standing balance comment: reliant on UE support for improved stability                            Communication Communication Communication: Impaired Factors Affecting Communication: Hearing impaired  Cognition Arousal:  Alert Behavior During Therapy: WFL for tasks assessed/performed                             Following commands: Impaired Following commands impaired: Follows one step commands with increased time    Cueing Cueing Techniques: Verbal cues, Gestural cues, Tactile cues  Exercises      General Comments        Pertinent Vitals/Pain Pain Assessment Pain Assessment: No/denies pain    Home Living Family/patient expects to be discharged to:: Private residence                        Prior Function            PT Goals (current goals can now be found in the care plan section) Acute Rehab PT Goals Time For Goal Achievement: 06/08/23 Potential to Achieve Goals: Good Progress towards PT goals: Progressing toward goals    Frequency    Min 2X/week      PT Plan      Co-evaluation   Reason for Co-Treatment: Necessary to address cognition/behavior during functional activity;For patient/therapist safety;To address functional/ADL transfers PT goals addressed during session: Mobility/safety with mobility;Balance;Proper use of DME OT goals addressed during session: ADL's and self-care      AM-PAC PT "6 Clicks" Mobility   Outcome Measure  Help needed turning from your back to your side while in a flat bed without using bedrails?: A Little Help needed moving from lying on your back to sitting on the side of a flat bed without using bedrails?: A Little Help needed moving to and from a bed to a chair (including a wheelchair)?: A Lot Help needed standing up from a chair using your arms (e.g., wheelchair or bedside chair)?: A Lot Help needed to walk in hospital room?: A Lot Help needed climbing 3-5 steps with a railing? : Total 6 Click Score: 13    End of Session Equipment Utilized During Treatment: Gait belt Activity Tolerance: Patient tolerated treatment well Patient left: in bed;with bed alarm set Nurse Communication: Mobility status PT Visit Diagnosis:  Other abnormalities of gait and mobility (R26.89);Difficulty in walking, not elsewhere classified (R26.2);Muscle weakness (generalized) (M62.81);Unsteadiness on feet (R26.81);Repeated falls (R29.6)     Time: 0981-1914 PT Time Calculation (min) (ACUTE ONLY): 20 min  Charges:    $Gait Training: 8-22 mins PT General Charges $$ ACUTE PT VISIT: 1 Visit                     Creed Copper Fairly, PT, DPT 05/30/23 1:02 PM

## 2023-05-30 NOTE — ED Provider Notes (Signed)
-----------------------------------------   5:47 AM on 05/30/2023 -----------------------------------------   Blood pressure (!) 126/108, pulse 68, temperature 98.7 F (37.1 C), temperature source Oral, resp. rate 16, height 5\' 8"  (1.727 m), weight 66.3 kg, SpO2 97%.  The patient is calm and cooperative at this time.  There have been no acute events since the last update.  Awaiting disposition plan from Social Work team.   Irean Hong, MD 05/30/23 [phone]

## 2023-05-30 NOTE — TOC Progression Note (Addendum)
 Transition of Care [facility]) - Progression Note    Patient Details  Name: Walter Vargas MRN: [id number] Date of Birth: 03/05/1946  [facility]) CM/SW Contact  Margarito Liner, LCSW Phone Number: 05/30/2023, 10:44 AM  Clinical Narrative: CSW is checking with SNF's that have offered a bed to see if patient will have to pay copays up front or if they would work out a payment plan.    2:06 pm: Left voicemail for son. Will review bed offers when he calls back.  4:49 pm: Received call back from son. Provided bed offers. He will review with his sister and follow up with decision. Patient walked 200 feet with PT today. CSW notified son that insurance may deny authorization but if so, they give the option to appeal.  Expected Discharge Plan: Skilled Nursing Facility Barriers to Discharge: Continued Medical Work up  Expected Discharge Plan and Services       Living arrangements for the past 2 months: Single Family Home                                       Social Determinants of Health (SDOH) Interventions SDOH Screenings   Food Insecurity: No Food Insecurity (04/26/2023)  Housing: Low Risk  (04/26/2023)  Transportation Needs: No Transportation Needs (04/26/2023)  Utilities: Not At Risk (04/26/2023)  Depression (PHQ2-9): Low Risk  (10/19/2022)  Social Connections: Patient Unable To Answer (04/26/2023)  Tobacco Use: Medium Risk (05/09/2023)    Readmission Risk Interventions    03/20/2023   12:12 PM  Readmission Risk Prevention Plan  Transportation Screening Complete  Medication Review Oceanographer) Complete  HRI or Home Care Consult Complete  SW Recovery Care/Counseling Consult Complete  Palliative Care Screening Not Applicable  Skilled Nursing Facility Not Applicable

## 2023-05-31 ENCOUNTER — Encounter: Admitting: Cardiology

## 2023-05-31 NOTE — ED Notes (Signed)
 Patient given ice cream as requested. NAD noted. No other needs expressed at this time.

## 2023-05-31 NOTE — ED Notes (Signed)
 Patient moved into hospital bed at this time.

## 2023-05-31 NOTE — ED Notes (Signed)
 Patient brief noted to be wet. Patient cleaned and placed in new brief with new chuck pad. New sacral pad placed at this time. NAD noted. Patient repositioned and sat up in bed to eat lunch tray. NAD noted. No other needs expressed.

## 2023-05-31 NOTE — ED Notes (Signed)
 Patient brief saturated. Patient gown changed, brief and chucks. Warm blankets provided and patient repositioned.

## 2023-05-31 NOTE — ED Provider Notes (Signed)
-----------------------------------------   5:26 AM on 05/31/2023 -----------------------------------------   Blood pressure (!) 124/90, pulse 72, temperature 98.4 F (36.9 C), temperature source Oral, resp. rate 17, height 5\' 8"  (1.727 m), weight 66.3 kg, SpO2 98%.  The patient is calm and cooperative at this time.  There have been no acute events since the last update.  Awaiting disposition plan from Social Work team.   Irean Hong, MD 05/31/23 [phone]

## 2023-05-31 NOTE — TOC Progression Note (Addendum)
 Transition of Care [facility]) - Progression Note    Patient Details  Name: OLSON LUCARELLI MRN: [id number] Date of Birth: 1945-08-15  Transition of Care [facility]) CM/SW Contact  Margarito Liner, LCSW Phone Number: 05/31/2023, 3:43 PM  Clinical Narrative:   Sherron Monday to son. He had a long conversation with the [employer] liaison. She plans to come meet patient tomorrow. Son thinks they may be able to pay the copays for [employer]. CSW left a message for the admissions coordinator asking them to review and get a more accurate cost. Son is aware it will likely be somewhere in the range of $2000.   3:55 pm: [employer] will require $2,296.00 upon admission. CSW left message for son to notify.  Expected Discharge Plan: Skilled Nursing Facility Barriers to Discharge: Continued Medical Work up  Expected Discharge Plan and Services       Living arrangements for the past 2 months: Single Family Home                                       Social Determinants of Health (SDOH) Interventions SDOH Screenings   Food Insecurity: No Food Insecurity (04/26/2023)  Housing: Low Risk  (04/26/2023)  Transportation Needs: No Transportation Needs (04/26/2023)  Utilities: Not At Risk (04/26/2023)  Depression (PHQ2-9): Low Risk  (10/19/2022)  Social Connections: Patient Unable To Answer (04/26/2023)  Tobacco Use: Medium Risk (05/09/2023)    Readmission Risk Interventions    03/20/2023   12:12 PM  Readmission Risk Prevention Plan  Transportation Screening Complete  Medication Review Oceanographer) Complete  HRI or Home Care Consult Complete  SW Recovery Care/Counseling Consult Complete  Palliative Care Screening Not Applicable  Skilled Nursing Facility Not Applicable

## 2023-05-31 NOTE — Progress Notes (Signed)
 PT Cancellation Note  Patient Details Name: Walter Vargas MRN: [id number] DOB: 02/15/1946   Cancelled Treatment:     Pt declined OOB activity/mobility   Jannet Askew 05/31/2023, 5:22 PM

## 2023-06-01 ENCOUNTER — Other Ambulatory Visit: Payer: Self-pay

## 2023-06-01 ENCOUNTER — Emergency Department

## 2023-06-01 ENCOUNTER — Encounter: Payer: Self-pay | Admitting: Internal Medicine

## 2023-06-01 DIAGNOSIS — E861 Hypovolemia: Secondary | ICD-10-CM | POA: Diagnosis not present

## 2023-06-01 DIAGNOSIS — Z1152 Encounter for screening for COVID-19: Secondary | ICD-10-CM | POA: Diagnosis not present

## 2023-06-01 DIAGNOSIS — D649 Anemia, unspecified: Secondary | ICD-10-CM | POA: Diagnosis present

## 2023-06-01 DIAGNOSIS — Z681 Body mass index (BMI) 19 or less, adult: Secondary | ICD-10-CM | POA: Diagnosis not present

## 2023-06-01 DIAGNOSIS — R6520 Severe sepsis without septic shock: Secondary | ICD-10-CM | POA: Diagnosis not present

## 2023-06-01 DIAGNOSIS — J189 Pneumonia, unspecified organism: Secondary | ICD-10-CM | POA: Diagnosis present

## 2023-06-01 DIAGNOSIS — I2510 Atherosclerotic heart disease of native coronary artery without angina pectoris: Secondary | ICD-10-CM | POA: Diagnosis present

## 2023-06-01 DIAGNOSIS — R54 Age-related physical debility: Secondary | ICD-10-CM | POA: Diagnosis present

## 2023-06-01 DIAGNOSIS — I110 Hypertensive heart disease with heart failure: Secondary | ICD-10-CM | POA: Diagnosis present

## 2023-06-01 DIAGNOSIS — A419 Sepsis, unspecified organism: Secondary | ICD-10-CM | POA: Diagnosis not present

## 2023-06-01 DIAGNOSIS — E785 Hyperlipidemia, unspecified: Secondary | ICD-10-CM | POA: Insufficient documentation

## 2023-06-01 DIAGNOSIS — I10 Essential (primary) hypertension: Secondary | ICD-10-CM | POA: Insufficient documentation

## 2023-06-01 DIAGNOSIS — R64 Cachexia: Secondary | ICD-10-CM | POA: Diagnosis present

## 2023-06-01 DIAGNOSIS — E43 Unspecified severe protein-calorie malnutrition: Secondary | ICD-10-CM | POA: Diagnosis not present

## 2023-06-01 DIAGNOSIS — L89152 Pressure ulcer of sacral region, stage 2: Secondary | ICD-10-CM | POA: Diagnosis present

## 2023-06-01 DIAGNOSIS — I25118 Atherosclerotic heart disease of native coronary artery with other forms of angina pectoris: Secondary | ICD-10-CM | POA: Diagnosis not present

## 2023-06-01 DIAGNOSIS — I255 Ischemic cardiomyopathy: Secondary | ICD-10-CM | POA: Diagnosis not present

## 2023-06-01 DIAGNOSIS — I2489 Other forms of acute ischemic heart disease: Secondary | ICD-10-CM | POA: Diagnosis present

## 2023-06-01 DIAGNOSIS — R7989 Other specified abnormal findings of blood chemistry: Secondary | ICD-10-CM | POA: Insufficient documentation

## 2023-06-01 DIAGNOSIS — J90 Pleural effusion, not elsewhere classified: Secondary | ICD-10-CM | POA: Diagnosis not present

## 2023-06-01 DIAGNOSIS — I5042 Chronic combined systolic (congestive) and diastolic (congestive) heart failure: Secondary | ICD-10-CM | POA: Diagnosis not present

## 2023-06-01 DIAGNOSIS — E871 Hypo-osmolality and hyponatremia: Secondary | ICD-10-CM | POA: Diagnosis not present

## 2023-06-01 DIAGNOSIS — Z951 Presence of aortocoronary bypass graft: Secondary | ICD-10-CM | POA: Diagnosis not present

## 2023-06-01 DIAGNOSIS — I447 Left bundle-branch block, unspecified: Secondary | ICD-10-CM | POA: Diagnosis present

## 2023-06-01 DIAGNOSIS — I739 Peripheral vascular disease, unspecified: Secondary | ICD-10-CM | POA: Diagnosis not present

## 2023-06-01 DIAGNOSIS — I472 Ventricular tachycardia, unspecified: Secondary | ICD-10-CM | POA: Diagnosis not present

## 2023-06-01 DIAGNOSIS — L89621 Pressure ulcer of left heel, stage 1: Secondary | ICD-10-CM | POA: Diagnosis present

## 2023-06-01 DIAGNOSIS — R0609 Other forms of dyspnea: Secondary | ICD-10-CM | POA: Diagnosis not present

## 2023-06-01 DIAGNOSIS — I272 Pulmonary hypertension, unspecified: Secondary | ICD-10-CM | POA: Diagnosis present

## 2023-06-01 DIAGNOSIS — W19XXXA Unspecified fall, initial encounter: Secondary | ICD-10-CM | POA: Diagnosis not present

## 2023-06-01 DIAGNOSIS — E872 Acidosis, unspecified: Secondary | ICD-10-CM | POA: Diagnosis not present

## 2023-06-01 DIAGNOSIS — R918 Other nonspecific abnormal finding of lung field: Secondary | ICD-10-CM | POA: Diagnosis not present

## 2023-06-01 DIAGNOSIS — J9601 Acute respiratory failure with hypoxia: Secondary | ICD-10-CM | POA: Diagnosis present

## 2023-06-01 DIAGNOSIS — R059 Cough, unspecified: Secondary | ICD-10-CM | POA: Diagnosis not present

## 2023-06-01 DIAGNOSIS — R0602 Shortness of breath: Secondary | ICD-10-CM | POA: Diagnosis not present

## 2023-06-01 DIAGNOSIS — I6529 Occlusion and stenosis of unspecified carotid artery: Secondary | ICD-10-CM

## 2023-06-01 DIAGNOSIS — Z8673 Personal history of transient ischemic attack (TIA), and cerebral infarction without residual deficits: Secondary | ICD-10-CM | POA: Diagnosis not present

## 2023-06-01 LAB — BRAIN NATRIURETIC PEPTIDE: B Natriuretic Peptide: 3074.9 pg/mL — ABNORMAL HIGH (ref 0.0–100.0)

## 2023-06-01 LAB — COMPREHENSIVE METABOLIC PANEL
ALT: 22 U/L (ref 0–44)
AST: 23 U/L (ref 15–41)
Albumin: 3.1 g/dL — ABNORMAL LOW (ref 3.5–5.0)
Alkaline Phosphatase: 78 U/L (ref 38–126)
Anion gap: 9 (ref 5–15)
BUN: 22 mg/dL (ref 8–23)
CO2: 22 mmol/L (ref 22–32)
Calcium: 8.5 mg/dL — ABNORMAL LOW (ref 8.9–10.3)
Chloride: 105 mmol/L (ref 98–111)
Creatinine, Ser: 0.73 mg/dL (ref 0.61–1.24)
GFR, Estimated: >60 mL/min (ref >60)
Glucose, Bld: 120 mg/dL — ABNORMAL HIGH (ref 70–99)
Potassium: 5 mmol/L (ref 3.5–5.1)
Sodium: 136 mmol/L (ref 135–145)
Total Bilirubin: 1.1 mg/dL (ref 0.0–1.2)
Total Protein: 6.9 g/dL (ref 6.5–8.1)

## 2023-06-01 LAB — BLOOD GAS, VENOUS
Acid-Base Excess: 0.3 mmol/L (ref 0.0–2.0)
Bicarbonate: 24.6 mmol/L (ref 20.0–28.0)
O2 Saturation: 54.1 %
Patient temperature: 37
pCO2, Ven: 38 mmHg — ABNORMAL LOW (ref 44–60)
pH, Ven: 7.42 (ref 7.25–7.43)
pO2, Ven: 33 mmHg (ref 32–45)

## 2023-06-01 LAB — CBC WITH DIFFERENTIAL/PLATELET
Abs Immature Granulocytes: 0.06 10*3/uL (ref 0.00–0.07)
Basophils Absolute: 0 10*3/uL (ref 0.0–0.1)
Basophils Relative: 0 %
Eosinophils Absolute: 0 10*3/uL (ref 0.0–0.5)
Eosinophils Relative: 0 %
HCT: 33.7 % — ABNORMAL LOW (ref 39.0–52.0)
Hemoglobin: 10.5 g/dL — ABNORMAL LOW (ref 13.0–17.0)
Immature Granulocytes: 1 %
Lymphocytes Relative: 6 %
Lymphs Abs: 0.7 10*3/uL (ref 0.7–4.0)
MCH: 28.4 pg (ref 26.0–34.0)
MCHC: 31.2 g/dL (ref 30.0–36.0)
MCV: 91.1 fL (ref 80.0–100.0)
Monocytes Absolute: 0.8 10*3/uL (ref 0.1–1.0)
Monocytes Relative: 7 %
Neutro Abs: 10.4 10*3/uL — ABNORMAL HIGH (ref 1.7–7.7)
Neutrophils Relative %: 86 %
Platelets: 376 10*3/uL (ref 150–400)
RBC: 3.7 MIL/uL — ABNORMAL LOW (ref 4.22–5.81)
RDW: 19.3 % — ABNORMAL HIGH (ref 11.5–15.5)
WBC: 12 10*3/uL — ABNORMAL HIGH (ref 4.0–10.5)
nRBC: 0.3 % — ABNORMAL HIGH (ref 0.0–0.2)

## 2023-06-01 LAB — TROPONIN I (HIGH SENSITIVITY)
Troponin I (High Sensitivity): 26 ng/L — ABNORMAL HIGH (ref <18)
Troponin I (High Sensitivity): 146 ng/L (ref <18)

## 2023-06-01 LAB — HEPARIN LEVEL (UNFRACTIONATED): Heparin Unfractionated: >1.1 [IU]/mL — ABNORMAL HIGH (ref 0.30–0.70)

## 2023-06-01 LAB — LACTIC ACID, PLASMA
Lactic Acid, Venous: 2.6 mmol/L (ref 0.5–1.9)
Lactic Acid, Venous: 1.8 mmol/L (ref 0.5–1.9)

## 2023-06-01 LAB — RESP PANEL BY RT-PCR (RSV, FLU A&B, COVID)  RVPGX2
Influenza A by PCR: NEGATIVE
Influenza B by PCR: NEGATIVE
Resp Syncytial Virus by PCR: NEGATIVE
SARS Coronavirus 2 by RT PCR: NEGATIVE

## 2023-06-01 LAB — PROTIME-INR
INR: 1.8 — ABNORMAL HIGH (ref 0.8–1.2)
Prothrombin Time: 21.1 s — ABNORMAL HIGH (ref 11.4–15.2)

## 2023-06-01 LAB — MRSA NEXT GEN BY PCR, NASAL: MRSA by PCR Next Gen: DETECTED — AB

## 2023-06-01 LAB — APTT: aPTT: 39 s — ABNORMAL HIGH (ref 24–36)

## 2023-06-01 MED ORDER — SODIUM CHLORIDE 0.9 % IV SOLN
2.0000 g | Freq: Once | INTRAVENOUS | Status: AC
  Administered 2023-06-01: 2 g via INTRAVENOUS
  Filled 2023-06-01: qty 12.5

## 2023-06-01 MED ORDER — VANCOMYCIN HCL IN DEXTROSE 1-5 GM/200ML-% IV SOLN
1000.0000 mg | Freq: Once | INTRAVENOUS | Status: AC
  Administered 2023-06-01: 1000 mg via INTRAVENOUS
  Filled 2023-06-01: qty 200

## 2023-06-01 MED ORDER — SENNOSIDES-DOCUSATE SODIUM 8.6-50 MG PO TABS: 1.0000 | ORAL_TABLET | Freq: Every evening | ORAL | Status: DC | PRN

## 2023-06-01 MED ORDER — SODIUM CHLORIDE 0.9 % IV SOLN
2.0000 g | Freq: Three times a day (TID) | INTRAVENOUS | Status: DC
  Administered 2023-06-02 – 2023-06-04 (×8): 2 g via INTRAVENOUS
  Filled 2023-06-01 (×8): qty 12.5

## 2023-06-01 MED ORDER — ONDANSETRON HCL 4 MG PO TABS: 4.0000 mg | ORAL_TABLET | Freq: Four times a day (QID) | ORAL | Status: AC | PRN

## 2023-06-01 MED ORDER — ACETAMINOPHEN 325 MG PO TABS: 650.0000 mg | ORAL_TABLET | Freq: Four times a day (QID) | ORAL | Status: AC | PRN

## 2023-06-01 MED ORDER — SODIUM CHLORIDE 0.9 % IV SOLN
500.0000 mg | INTRAVENOUS | Status: DC
  Administered 2023-06-01: 500 mg via INTRAVENOUS
  Filled 2023-06-01: qty 5

## 2023-06-01 MED ORDER — LACTATED RINGERS IV BOLUS
500.0000 mL | Freq: Once | INTRAVENOUS | Status: AC
  Administered 2023-06-01: 500 mL via INTRAVENOUS

## 2023-06-01 MED ORDER — ACETAMINOPHEN 650 MG RE SUPP: 650.0000 mg | Freq: Four times a day (QID) | RECTAL | Status: AC | PRN

## 2023-06-01 MED ORDER — LACTATED RINGERS IV BOLUS: 1000.0000 mL | Freq: Once | INTRAVENOUS | Status: DC

## 2023-06-01 MED ORDER — DIAZEPAM 5 MG/ML IJ SOLN
2.5000 mg | Freq: Once | INTRAMUSCULAR | Status: AC
  Administered 2023-06-01: 2.5 mg via INTRAVENOUS
  Filled 2023-06-01: qty 2

## 2023-06-01 MED ORDER — VANCOMYCIN HCL 500 MG/100ML IV SOLN
500.0000 mg | Freq: Once | INTRAVENOUS | Status: AC
  Administered 2023-06-01: 500 mg via INTRAVENOUS
  Filled 2023-06-01: qty 100

## 2023-06-01 MED ORDER — VANCOMYCIN HCL 750 MG/150ML IV SOLN
750.0000 mg | Freq: Two times a day (BID) | INTRAVENOUS | Status: DC
  Administered 2023-06-02 – 2023-06-04 (×5): 750 mg via INTRAVENOUS
  Filled 2023-06-01 (×5): qty 150

## 2023-06-01 MED ORDER — ONDANSETRON HCL 4 MG/2ML IJ SOLN: 4.0000 mg | Freq: Four times a day (QID) | INTRAMUSCULAR | Status: AC | PRN

## 2023-06-01 MED ORDER — HEPARIN (PORCINE) 25000 UT/250ML-% IV SOLN
1050.0000 [IU]/h | INTRAVENOUS | Status: DC
  Administered 2023-06-01: 800 [IU]/h via INTRAVENOUS
  Administered 2023-06-02: 1050 [IU]/h via INTRAVENOUS
  Filled 2023-06-01 (×2): qty 250

## 2023-06-01 MED ORDER — FUROSEMIDE 10 MG/ML IJ SOLN
40.0000 mg | Freq: Once | INTRAMUSCULAR | Status: AC
  Administered 2023-06-01: 40 mg via INTRAVENOUS
  Filled 2023-06-01: qty 4

## 2023-06-01 NOTE — Assessment & Plan Note (Signed)
 Strict I's and O's If patient continues to have acute hypoxic respiratory failure, a.m. team to consider IR consultation for Thoracentesis

## 2023-06-01 NOTE — Assessment & Plan Note (Addendum)
 History of VT peri-CABG Amiodarone 200 mg daily resumed daily

## 2023-06-01 NOTE — Assessment & Plan Note (Signed)
 Aspirin 81 mg daily, amiodarone 200 mg daily, atorvastatin 80 mg daily

## 2023-06-01 NOTE — Progress Notes (Signed)
 PHARMACY - ANTICOAGULATION CONSULT NOTE  Pharmacy Consult for heparin Indication: chest pain/ACS  No Known Allergies  Patient Measurements: Height: 5\' 8"  (172.7 cm) Weight: 66.3 kg (146 lb 2.6 oz) IBW/kg (Calculated) : 68.4 Heparin Dosing Weight: 66.3 kg  Vital Signs: Temp: 98.9 F (37.2 C) (03/21 1555) Temp Source: Oral (03/21 1555) BP: 112/57 (03/21 1830) Pulse Rate: 66 (03/21 1830)  Labs: Recent Labs    06/01/23 1631 06/01/23 1809  HGB 10.5*  --   HCT 33.7*  --   PLT 376  --   CREATININE 0.73  --   TROPONINIHS 26* 146*    Estimated Creatinine Clearance: 72.5 mL/min (by C-G formula based on SCr of 0.73 mg/dL).  Medical History: Past Medical History:  Diagnosis Date   Essential hypertension    HFrEF (heart failure with reduced ejection fraction) (HCC)    Mitral regurgitation    Assessment: 78 y/o male presenting after mechanical fall. Patient has been boarding in ED pending placement, however, this evening he became altered from baseline and was found to have elevated troponins. 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 Pharmacy has been consulted to transition to heparin infusion.  Last dose of apixaban given: 5 mg @ 0955 on 3/21  Baseline labs: hgb 10.5, plt 376, INR pending, HL > 1.10, aPTT 39  Goal of Therapy:  Heparin level 0.3-0.7 units/ml aPTT 66-102 seconds Monitor platelets by anticoagulation protocol: Yes   Plan:  Stop apixaban 5 mg po twice daily Start heparin infusion at 800 units/hour without bolus given recent use of apixaban Check 8 hour aPTT level Monitor aPTT and Anti-Xa levels daily until correlating, then monitor Anti-Xa levels only Monitor CBC and signs/symptoms of bleeding  Thank you for involving pharmacy in this patient's care.   Rockwell Alexandria, PharmD Clinical Pharmacist 06/01/2023 7:21 PM

## 2023-06-01 NOTE — Sepsis Progress Note (Signed)
 eLink is following this Code Sepsis.

## 2023-06-01 NOTE — ED Notes (Signed)
 Patient continuing to remove [place] after multiple times being educated by this nurse and reapplying. Patient removing gown and monitoring equipment. Patient educated on the same. O2 titrated to 3L r/t 100% O2. Will continue to monitor.

## 2023-06-01 NOTE — ED Notes (Signed)
 Fall alarm ringing and patient found trying to get out of bed. Patient confused and in mild resp distress/coughing. Patient placed back in the bed, brief and chucks changed. VS obtained revealing 80% on room air. Patient was placed on [place] 6L and 94%. Patient moved from hall bed to RM 19 for monitoring and O2 supply. Patient more compliant with care and mentation improving post O2 placement but amnesia to events prior getting out of bed. MD [place] notified. Orders to follow.

## 2023-06-01 NOTE — ED Notes (Signed)
 Humidity placed to [place]

## 2023-06-01 NOTE — ED Notes (Signed)
 Pt becoming more agitated and confused, refusing to take PRN anxiety medication at this time. Pt accusing this nurse of stealing is debit card and states he is going to call the police. Provider notified at this time and awaiting response.

## 2023-06-01 NOTE — Assessment & Plan Note (Addendum)
 With poor inspiratory effort and poor cough reflex Query health care associated as patient has been in the ED for about 7 days Check MRSA PCR on admission Cefepime and vancomycin per pharmacy Azithromycin 500 mg IV daily for atypical coverage IS, Flutter valve Respiratory has been consulted to educate and show patient appropriate I-S and flutter valve use

## 2023-06-01 NOTE — Hospital Course (Addendum)
 [name] is a 78 year old male with history of GERD, hyperlipidemia, CAD status post CABG, on Eliquis and amiodarone, carotid enterectomy, who presents emergency department for chief concerns of falling.  Patient was pending discharge to skilled nursing facility in the ED since 05/24/2023.  On day of admission, patient was noted to have hypoxic respite failure, desatting to 80% on room air.  Patient was placed on 6 L nasal cannula with improvement to 94%.  Patient has been titrated down to 3 L nasal cannula at this time with SpO2 100% on room air.  And during hypoxic episode, patient was noted to be altered and trying to pull out leads and oxygen supplementation.  Patient is currently more redirectable.  Vitals in the ED showed temperature of 98.9, respiration rate of 26, heart rate 94, blood pressure 125/70, SpO2 of 80% on room air, currently improved to 1 high percent on 3 L nasal cannula.  Patient was admitted for further evaluation and management of severe sepsis due multifocal pneumonia complicated by acute respiratory failure with hypoxia.  Patient completed a course of antibiotics and remained stable since then.  He initially required oxygen due to acute respiratory failure secondary to pneumonia and pleural effusions which has been resolved and now he is on room air.  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  He found to have mildly elevated troponin likely due to demand ischemia with sepsis and hypoxia.  EF of 20 to 25% and grade 2 diastolic dysfunction according to the echo done on 04/20/2023.  BNP was elevated at 3075, also has baseline elevated BNP.  Cardiology was consulted.  He received 48 hours of heparin.  No other ischemic workup needed.  Cardiology later signed off with recommendations to continue amiodarone, low-dose losartan, aspirin, Plavix and Lipitor.  His Eliquis was discontinued as there is no concern of atrial  fibrillation.  Patient was also found to have severe protein caloric malnutrition.  Dietitian was consulted and he will continue on dietary supplements.  Patient was getting wound care for his chronic bilateral heel wounds and will continue to get wound care at the facility.  No concern of infection at this time.  QuantiFERON-TB test was obtained at facility request and it was normal.  Patient is being discharged to SNF for further management.  He will continue on current medications and need to have a close follow-up with his providers for further assistance.

## 2023-06-01 NOTE — ED Provider Notes (Signed)
-----------------------------------------   4:09 PM on 06/01/2023 ----------------------------------------- Patient currently boarding in the ED for placement to a nursing facility, I was notified by staff that patient was found confused and trying to get up out of bed, which is new for him.  He was found to be hypoxic to 80% on room air, also noted to be tachypneic.  Oxygen levels improved on 4 L nasal cannula, it has been 1 week since we obtained labs and plan to check EKG, labs, and chest x-ray.  Viral testing is also pending at this time, lungs with some crackles but no wheezing noted.  EKG shows left bundle branch block which is similar to previous with no ischemic changes.  ED ECG REPORT I, Chesley Noon, the attending physician, personally viewed and interpreted this ECG.   Date: 06/01/2023  EKG Time: 16:20  Rate: 87  Rhythm: normal sinus rhythm  Axis: Normal  Intervals:left bundle branch block  ST&T Change: None, negative sgarbossa  ----------------------------------------- 6:15 PM on 06/01/2023 ----------------------------------------- Chest x-ray with multifocal infiltrates concerning for edema versus infection.  Patient clinically does not seem fluid overloaded, troponin stable compared to previous and BNP pending at this time.  He does have an increased white count and we will start on IV antibiotics, case discussed with hospitalist for admission.    Chesley Noon, MD 06/01/23 [phone]

## 2023-06-01 NOTE — ED Provider Notes (Signed)
-----------------------------------------   6:39 AM on 06/01/2023 -----------------------------------------   Blood pressure 108/61, pulse 60, temperature 97.7 F (36.5 C), temperature source Oral, resp. rate 17, height 5\' 8"  (1.727 m), weight 66.3 kg, SpO2 95%.  The patient is calm and cooperative at this time.  There have been no acute events since the last update.  Awaiting disposition plan from case management/social work.    Naylee Frankowski, Layla Maw, [place] 06/01/23 [phone]

## 2023-06-01 NOTE — Progress Notes (Signed)
 CODE SEPSIS - PHARMACY COMMUNICATION  **Broad Spectrum Antibiotics should be administered within 1 hour of Sepsis diagnosis**  Time Code Sepsis Called/Page Received: 1755  Antibiotics Ordered: cefepime and vancomycin  Time of 1st antibiotic administration: 1817  Additional action taken by pharmacy: None  If necessary, Name of Provider/Nurse Contacted: None    Rockwell Alexandria ,PharmD Clinical Pharmacist  06/01/2023  6:22 PM

## 2023-06-01 NOTE — ED Notes (Signed)
 Pt bed bath given, diaper changed, pericare applied and new foam lift dressing applied. CB in reach, fall precautions re-initiated. Area cleaned up and trash thrown away.

## 2023-06-01 NOTE — H&P (Addendum)
 History and Physical   Walter Vargas:[id number] DOB: 10-28-1945 DOA: 05/24/2023  PCP: [name], NP  Outpatient Specialists: Dr. Okey Dupre, [facility] cardiology Patient coming from: ED boarding  I have personally briefly reviewed patient's old medical records in [facility] EMR.  Chief Concern: Acute hypoxic respiratory failure  HPI: Walter Vargas is a 78 year old male with history of GERD, hyperlipidemia, CAD status post CABG, on Eliquis and amiodarone, carotid enterectomy, who presents emergency department for chief concerns of falling.  Patient was pending discharge to skilled nursing facility in the ED since 05/24/2023.  On day of admission, patient was noted to have hypoxic respite failure, desatting to 80% on room air.  Patient was placed on 6 L nasal cannula with improvement to 94%.  Patient has been titrated down to 3 L nasal cannula at this time with SpO2 100% on room air.  And during hypoxic episode, patient was noted to be altered and trying to pull out leads and oxygen supplementation.  Patient is currently more redirectable.  Vitals in the ED showed temperature of 98.9, respiration rate of 26, heart rate 94, blood pressure 125/70, SpO2 of 80% on room air, currently improved to 1 high percent on 3 L nasal cannula.  ED treatment: Cefepime, vancomycin per pharmacy. ------------------------------- At bedside, patient he is able to tell me his name, age, location, current calendar year.   He reports he has been coughing all day. He reports the cough is productive of dark yellow and brown sputum.  He reports this is new for him today.  He reports he has not been coughing until today.    He denies trauma to his person. He denies shortness of breath, chest pain.   He denies trauma to his person.  Social history: He lives at home with [place]. He denies tobacco, etoh, and recreational drug use. He is retired and formerly worked Chemical engineer houses  ROS: Constitutional: no weight change,  no fever ENT/Mouth: no sore throat, no rhinorrhea Eyes: no eye pain, no vision changes Cardiovascular: no chest pain, no dyspnea,  no edema, no palpitations Respiratory: + cough, + sputum, no wheezing Gastrointestinal: no nausea, no vomiting, no diarrhea, no constipation Genitourinary: no urinary incontinence, no dysuria, no hematuria Musculoskeletal: no arthralgias, no myalgias Skin: no skin lesions, no pruritus, Neuro: + weakness, no loss of consciousness, no syncope Psych: no anxiety, no depression, no decrease appetite Heme/Lymph: no bruising, no bleeding  ED Course: Discussed with EDP, patient requiring hospitalization for chief concerns of acute hypoxic respiratory failure, with possible source of pneumonia.  Assessment/Plan  Principal Problem:   Pneumonia Active Problems:   Severe sepsis (HCC)   VT (ventricular tachycardia) (HCC)   Dyspnea on exertion   Pleural effusion   Heart failure (HCC)   Protein-calorie malnutrition, severe   Debility   S/P CABG x 5   Essential hypertension   CAD (coronary artery disease)   Hyperlipidemia   Elevated troponin   Assessment and Plan:  * Pneumonia With poor inspiratory effort and poor cough reflex Query health care associated as patient has been in the ED for about 7 days Check MRSA PCR on admission Cefepime and vancomycin per pharmacy Azithromycin 500 mg IV daily for atypical coverage IS, Flutter valve Respiratory has been consulted to educate and show patient appropriate I-S and flutter valve use  Severe sepsis (HCC) With organ involvement Patient had increased respiration rate, leukocytosis of 12, lactic acid elevation of 2.6, source is pneumonia with pulmonary and cardiac organ  involvement in setting of acute hypoxic respiratory failure Blood cultures x2 are in process Continue with cefepime and vancomycin per pharmacy Added azithromycin 500 mg IV daily for atypical coverage Incentive spirometry, flutter  valve Continuous pulse oximetry to maintain SpO2 greater than 92% Admitted to the cardiac, inpatient  VT (ventricular tachycardia) (HCC) History of VT peri-CABG Amiodarone 200 mg daily resumed daily  Elevated troponin First troponin is 26 and on repeat is 146 Heparin GTT initiated on admission Patient denies chest pain and EKG was negative for ischemic changes I suspect this is secondary to demand ischemia in setting of acute hypoxic respite failure secondary to pneumonia Continue treatment per above  Hyperlipidemia Home atorvastatin 80 mg daily resumed on admission  CAD (coronary artery disease) Aspirin 81 mg daily, atorvastatin 80 mg daily, were resumed on admission  S/P CABG x 5 Aspirin 81 mg daily, amiodarone 200 mg daily, atorvastatin 80 mg daily  Protein-calorie malnutrition, severe Registered dietitian has been consulted  Heart failure (HCC) Heart failure reduced ejection fraction Complete echo on 04/20/2023: Estimated ejection fraction is 20 to 25%, grade 2 diastolic dysfunction Home digoxin 0.125 mg daily  Pleural effusion Strict I's and O's If patient continues to have acute hypoxic respiratory failure, a.m. team to consider IR consultation for Thoracentesis  Chart reviewed.   DVT prophylaxis: Eliquis 5 mg p.o. twice daily Code Status: Full code Diet: N.p.o. pending nursing swallow screening Family Communication: A phone call was offered, patient declined Disposition Plan: Pending clinical course Consults called: Pharmacy Admission status: Telemetry cardiac, inpatient  Past Medical History:  Diagnosis Date   Essential hypertension    HFrEF (heart failure with reduced ejection fraction) (HCC)    Mitral regurgitation    Past Surgical History:  Procedure Laterality Date   CORONARY ARTERY BYPASS GRAFT N/A 03/01/2023   Procedure: CORONARY ARTERY BYPASS GRAFTING X 5, USING LEFT INTERNAL MAMMARY ARTERY AND ENDOSCOPICALLY HARVESTED RIGHT SAPHENOUS VEIN GRAFT;   Surgeon: [name], MD;  Location: [facility] OR;  Service: Open Heart Surgery;  Laterality: N/A;   ENDARTERECTOMY Left 04/25/2023   Procedure: ENDARTERECTOMY CAROTID USING 1X6 CM XENOSURE BIOLOGIC PATCH;  Surgeon: Leonie Douglas, MD;  Location: [facility] OR;  Service: Vascular;  Laterality: Left;   EXPLORATION POST OPERATIVE OPEN HEART N/A 03/01/2023   Procedure: EXPLORATION POST OPERATIVE OPEN HEART;  Surgeon: [name], MD;  Location: [facility] OR;  Service: Open Heart Surgery;  Laterality: N/A;   NO PAST SURGERIES     PLACEMENT OF IMPELLA LEFT VENTRICULAR ASSIST DEVICE  03/01/2023   Procedure: PLACEMENT OF IMPELLA LEFT VENTRICULAR ASSIST DEVICE;  Surgeon: [name], MD;  Location: [facility] OR;  Service: Open Heart Surgery;;   REMOVAL OF IMPELLA LEFT VENTRICULAR ASSIST DEVICE N/A 03/12/2023   Procedure: REMOVAL OF IMPELLA LEFT VENTRICULAR ASSIST DEVICE;  Surgeon: [name], MD;  Location: [facility] OR;  Service: Open Heart Surgery;  Laterality: N/A;   RIGHT/LEFT HEART CATH AND CORONARY ANGIOGRAPHY N/A 02/13/2023   Procedure: RIGHT/LEFT HEART CATH AND CORONARY ANGIOGRAPHY;  Surgeon: Yvonne Kendall, MD;  Location: [facility] INVASIVE CV LAB;  Service: Cardiovascular;  Laterality: N/A;   TEE WITHOUT CARDIOVERSION N/A 03/01/2023   Procedure: TRANSESOPHAGEAL ECHOCARDIOGRAM (TEE);  Surgeon: [name], MD;  Location: [facility] OR;  Service: Open Heart Surgery;  Laterality: N/A;   TEE WITHOUT CARDIOVERSION N/A 03/12/2023   Procedure: TRANSESOPHAGEAL ECHOCARDIOGRAM (TEE);  Surgeon: [name], MD;  Location: [facility] OR;  Service: Open Heart Surgery;  Laterality: N/A;   Social  History:  reports that he quit smoking about 47 years ago. His smoking use included cigarettes. He started smoking about 57 years ago. He has a 5 pack-year smoking history. He has never used smokeless tobacco. He reports that he does not currently use alcohol. He reports that he does not use drugs.  No Known  Allergies Family History  Problem Relation Age of Onset   Diabetes Mother        gestational diabetes   Hypertension Father    Alcohol abuse Father    Heart disease Brother    Alcohol abuse Brother    Drug abuse Brother    Family history: Family history reviewed and not pertinent.  Prior to Admission medications   Medication Sig Start Date End Date Taking? Authorizing Provider  amiodarone (PACERONE) 200 MG tablet Take 1 tablet (200 mg total) by mouth daily. 04/24/23  Yes Angiulli, Mcarthur Rossetti, PA-C  apixaban (ELIQUIS) 5 MG TABS tablet Take 1 tablet (5 mg total) by mouth 2 (two) times daily. 05/02/23  Yes Rhyne, Ames Coupe, PA-C  ascorbic acid (VITAMIN C) 500 MG tablet Take 1 tablet (500 mg total) by mouth daily. 04/24/23  Yes Angiulli, Mcarthur Rossetti, PA-C  aspirin 81 MG chewable tablet Chew 1 tablet (81 mg total) by mouth daily. 04/24/23  Yes Angiulli, Mcarthur Rossetti, PA-C  atorvastatin (LIPITOR) 80 MG tablet Take 1 tablet (80 mg total) by mouth daily. 02/15/23  Yes Leeroy Bock, MD  digoxin (LANOXIN) 0.125 MG tablet Take 1 tablet (0.125 mg total) by mouth daily. 04/24/23  Yes Angiulli, Mcarthur Rossetti, PA-C  docusate (COLACE) 50 MG/5ML liquid Take 10 mLs (100 mg total) by mouth 2 (two) times daily. 04/24/23  Yes Angiulli, Mcarthur Rossetti, PA-C  hydrOXYzine (ATARAX) 25 MG tablet Take 1 tablet (25 mg total) by mouth 3 (three) times daily as needed for anxiety. 04/24/23  Yes Angiulli, Mcarthur Rossetti, PA-C  Multiple Vitamins-Minerals (MULTIVITAMIN MEN 50+) TABS Take 1 tablet by mouth daily.   Yes [provider]  pantoprazole (PROTONIX) 40 MG tablet Take 1 tablet (40 mg total) by mouth 2 (two) times daily before a meal. 04/24/23  Yes Charlton Amor, PA-C   Physical Exam: Vitals:   06/01/23 1723 06/01/23 1830 06/01/23 1904 06/01/23 1930  BP:  (!) 112/57  (!) 110/56  Pulse: 74 66  67  Resp: (!) 24 (!) 23  (!) 22  Temp:      TempSrc:      SpO2: 100% 99%  99%  Weight:   66.3 kg   Height:   5\' 8"  (1.727 m)     Constitutional: appears frail, cachectic, weak Eyes: PERRL, lids and conjunctivae normal ENMT: Mucous membranes are moist. Posterior pharynx clear of any exudate or lesions. Age-appropriate dentition. Hearing appropriate Neck: normal, supple, no masses, no thyromegaly Respiratory: Decreased lung sounds in bilaterally  lower lobes, no wheezing, no crackles. Normal respiratory effort. No accessory muscle use.  Nasal cannula in place Cardiovascular: Regular rate and rhythm, no murmurs / rubs / gallops. No extremity edema. 2+ pedal pulses. No carotid bruits.  Abdomen: Scaphoid abdomen, no tenderness, no masses palpated, no hepatosplenomegaly. Bowel sounds positive.  Musculoskeletal: no clubbing / cyanosis. No joint deformity upper and lower extremities. Good ROM, no contractures, no atrophy. Normal muscle tone.  Skin: no rashes, lesions, ulcers. No induration.  Multiple old eschar wounds located in the bilateral lower extremity.  Patient is not able to tell me how he developed these.  Neurologic: Sensation intact. Strength  5/5 in all 4.  Psychiatric: Normal judgment and insight. Alert and oriented x 3.  Depressed mood.  Flat affect  EKG: independently reviewed, showing sinus rhythm with rate of 87, QTc 508  Chest x-ray on Admission: I personally reviewed and I agree with radiologist reading as below.  DG Chest Portable 1 View Result Date: 06/01/2023 CLINICAL DATA:  Shortness of breath.  Dyspnea.  Cough. EXAM: PORTABLE CHEST 1 VIEW COMPARISON:  05/09/2023 FINDINGS: Prior CABG again noted. New diffuse interstitial opacities in the upper lung fields, and atelectasis or consolidation in the lower lung fields. Increased bilateral pleural effusions also seen. IMPRESSION: New diffuse interstitial opacities in the upper lung fields, and atelectasis or consolidation in the lower lung fields. This is likely due to diffuse edema, although superimposed pneumonia cannot be excluded. Bilateral pleural effusions.  Electronically Signed   By: Danae Orleans M.D.   On: 06/01/2023 17:50   Labs on Admission: I have personally reviewed following labs  CBC: Recent Labs  Lab 06/01/23 1631  WBC 12.0*  NEUTROABS 10.4*  HGB 10.5*  HCT 33.7*  MCV 91.1  PLT 376   Basic Metabolic Panel: Recent Labs  Lab 06/01/23 1631  NA 136  K 5.0  CL 105  CO2 22  GLUCOSE 120*  BUN 22  CREATININE 0.73  CALCIUM 8.5*   GFR: Estimated Creatinine Clearance: 72.5 mL/min (by C-G formula based on SCr of 0.73 mg/dL).  Liver Function Tests: Recent Labs  Lab 06/01/23 1631  AST 23  ALT 22  ALKPHOS 78  BILITOT 1.1  PROT 6.9  ALBUMIN 3.1*   BNP (last 3 results) Recent Labs    10/12/22 1030 10/19/22 0852 01/08/23 0957  PROBNP 2,878.0* 2,297.0* >4978.0*   Urine analysis:    Component Value Date/Time   COLORURINE AMBER (A) 05/24/2023 2104   APPEARANCEUR CLEAR (A) 05/24/2023 2104   LABSPEC 1.023 05/24/2023 2104   PHURINE 6.0 05/24/2023 2104   GLUCOSEU NEGATIVE 05/24/2023 2104   HGBUR NEGATIVE 05/24/2023 2104   BILIRUBINUR NEGATIVE 05/24/2023 2104   KETONESUR NEGATIVE 05/24/2023 2104   PROTEINUR NEGATIVE 05/24/2023 2104   NITRITE NEGATIVE 05/24/2023 2104   LEUKOCYTESUR NEGATIVE 05/24/2023 2104   CRITICAL CARE Performed by: Dr. Sedalia Muta  Total critical care time: 32 minutes  Critical care time was exclusive of separately billable procedures and treating other patients.  Critical care was necessary to treat or prevent imminent or life-threatening deterioration.  Critical care was time spent personally by me on the following activities: development of treatment plan with patient and/or surrogate as well as nursing, discussions with consultants, evaluation of patient's response to treatment, examination of patient, obtaining history from patient or surrogate, ordering and performing treatments and interventions, ordering and review of laboratory studies, ordering and review of radiographic studies, pulse  oximetry and re-evaluation of patient's condition.  This document was prepared using Dragon Voice Recognition software and may include unintentional dictation errors.  Dr. Sedalia Muta [facility]  If 7PM-7AM, please contact overnight-coverage provider If 7AM-7PM, please contact day attending provider [email or web address]  06/01/2023, 8:10 PM

## 2023-06-01 NOTE — Assessment & Plan Note (Signed)
 Home atorvastatin 80 mg daily resumed on admission

## 2023-06-01 NOTE — ED Notes (Signed)
 Pt cleaned, pericare applied, sheets changed. CB in reach and fall precautions maintained. No incident.

## 2023-06-01 NOTE — TOC CM/SW Note (Signed)
 CSW spoke with Danielle with [facility] after her visit with pt today.  She stated that she'd spoken with pt's son, [name], and discussed discharge plan to [facility].  She stated that she explained that charges, etc for pt going to [facility].  CSW texted [name] and called him to follow up with his decision.  There has been no response.  TOC will continue to follow for discharge planning.

## 2023-06-01 NOTE — Assessment & Plan Note (Addendum)
 With organ involvement Patient had increased respiration rate, leukocytosis of 12, lactic acid elevation of 2.6, source is pneumonia with pulmonary and cardiac organ involvement in setting of acute hypoxic respiratory failure Blood cultures x2 are in process Continue with cefepime and vancomycin per pharmacy Added azithromycin 500 mg IV daily for atypical coverage Incentive spirometry, flutter valve Continuous pulse oximetry to maintain SpO2 greater than 92% Admitted to the cardiac, inpatient

## 2023-06-01 NOTE — Assessment & Plan Note (Signed)
 First troponin is 26 and on repeat is 146 Heparin GTT initiated on admission Patient denies chest pain and EKG was negative for ischemic changes I suspect this is secondary to demand ischemia in setting of acute hypoxic respite failure secondary to pneumonia Continue treatment per above

## 2023-06-01 NOTE — Assessment & Plan Note (Signed)
-  Registered dietitian has been consulted 

## 2023-06-01 NOTE — Assessment & Plan Note (Signed)
 Heart failure reduced ejection fraction Complete echo on 04/20/2023: Estimated ejection fraction is 20 to 25%, grade 2 diastolic dysfunction Home digoxin 0.125 mg daily

## 2023-06-01 NOTE — Progress Notes (Signed)
 Pharmacy Antibiotic Note  Walter Vargas is a 78 y.o. male admitted on 05/24/2023 from nursing facility due to fall. Patient is currently boarding in the ED pending placement, however, patient became altered from baseline this evening. In ED, patient is afebrile with WBC 12.0, currently on 3LNC (no oxygen requirements noted at [facility]). Pharmacy has been consulted for cefepime and vancomycin dosing.  Plan: Start cefepime 2 g IV every 8 hours Vancomycin 100 mg IV x1 given in ED - will give an additional 500 mg IV x1 (for total vancomycin load of 1500 mg IV) Start vancomycin 750 mg IV every 12 hours (eAUC 472.6, Scr 0.8, IBW used, Vd 0.72 L/kg) Monitor renal function, clinical status, culture data, and LOT F/u MRSA PCR and de-escalate antibiotics as able  Height: 5\' 8"  (172.7 cm) Weight: 66.3 kg (146 lb 2.6 oz) IBW/kg (Calculated) : 68.4  Temp (24hrs), Avg:98.6 F (37 C), Min:98.3 F (36.8 C), Max:98.9 F (37.2 C)  Recent Labs  Lab 06/01/23 1630 06/01/23 1631  WBC  --  12.0*  CREATININE  --  0.73  LATICACIDVEN 2.6*  --     Estimated Creatinine Clearance: 72.5 mL/min (by C-G formula based on SCr of 0.73 mg/dL).    No Known Allergies  Antimicrobials this admission: cefepime 3/21 >>  vancomycin 3/21 >>   Dose adjustments this admission: N/A  Microbiology results: 3/21 BCx: pending 3/21 MRSA PCR: pending  Thank you for involving pharmacy in this patient's care.   Rockwell Alexandria, PharmD Clinical Pharmacist 06/01/2023 6:25 PM

## 2023-06-01 NOTE — Code Documentation (Signed)
 CODE SEPSIS - PHARMACY COMMUNICATION  **Broad Spectrum Antibiotics should be administered within 1 hour of Sepsis diagnosis**  Time Code Sepsis Called/Page Received: 1755  Antibiotics Ordered: cefepime, vancomycin  Time of 1st antibiotic administration: 1817  Additional action taken by pharmacy: none required  If necessary, Name of Provider/Nurse Contacted: N/A    Lowella Bandy ,PharmD Clinical Pharmacist  06/01/2023  5:57 PM

## 2023-06-01 NOTE — ED Notes (Signed)
 Fall precautions maintained with Pt, Pt given crackers.

## 2023-06-01 NOTE — Assessment & Plan Note (Signed)
 Aspirin 81 mg daily, atorvastatin 80 mg daily, were resumed on admission

## 2023-06-01 NOTE — Progress Notes (Signed)
 Occupational Therapy Treatment Patient Details Name: Walter Vargas MRN: [id number] DOB: 12-06-1945 Today's Date: 06/01/2023   History of present illness pt is a 78 year old male who presents to the ED after a fall. 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  He was treated by wound care for a deep tissue pressure injury to his  sacrum. He was eventually stable for transfer to CIR on 03/20/2023. He had increasing agitation, confusion, and ongoing cognitive decline. Work up revealed he had a CVA while at CIR (acute ischemic left middle cerebral artery (MCA) stroke. Finally, during his work up for CABG, he was found to have an asymptomatic left ICA stenosis 80 to 99%.He had a left carotid endarterectomy with bovine pericardial patch angioplasty on 04/25/2023. He was discharged to SNF on 05/04/2023, recently discharged from SNF per chart   OT comments  Pt supine in bed and is very verbose throughout session. Pt needing max multimodal cues to redirect to task and appears to be hallucinating. Pt performing bed mobility with min A to EOB. He sit for ~ 5 minutes with supervision for static sitting balance. Pt attempts standing but unable. On second attempt, he comes into standing with mod lifting assistance but displays posterior bias. Mod A for lateral steps to the R before returning to supine. Bed alarm activated for safety.      If plan is discharge home, recommend the following:  A little help with walking and/or transfers;A little help with bathing/dressing/bathroom;Assistance with cooking/housework;Assist for transportation;Help with stairs or ramp for entrance;Direct supervision/assist for financial management;Direct supervision/assist for medications management;Supervision due to cognitive status   Equipment Recommendations  Other (comment) (defer to next venue of  care)       Precautions / Restrictions Precautions Precautions: Fall Recall of Precautions/Restrictions: Impaired Precaution/Restrictions Comments: impulsive, delayed processing       Mobility Bed Mobility Overal bed mobility: Needs Assistance Bed Mobility: Supine to Sit, Sit to Supine     Supine to sit: Min assist Sit to supine: Min assist        Transfers Overall transfer level: Needs assistance Equipment used: Rolling walker (2 wheels) Transfers: Sit to/from Stand Sit to Stand: Mod assist                 Balance Overall balance assessment: Needs assistance Sitting-balance support: Feet supported Sitting balance-Leahy Scale: Good   Postural control: Posterior lean                                 ADL either performed or assessed with clinical judgement   ADL Overall ADL's : Needs assistance/impaired                                            Extremity/Trunk Assessment Upper Extremity Assessment Upper Extremity Assessment: Generalized weakness   Lower Extremity Assessment Lower Extremity Assessment: Generalized weakness        Vision Patient Visual Report: No change from baseline               Cognition Arousal: Alert Behavior During Therapy: WFL for tasks assessed/performed Cognition: Cognition impaired  Pertinent Vitals/ Pain       Pain Assessment Pain Assessment: No/denies pain         Frequency  Min 2X/week        Progress Toward Goals  OT Goals(current goals can now be found in the care plan section)  Progress towards OT goals: Progressing toward goals      AM-PAC OT "6 Clicks" Daily Activity     Outcome Measure   Help from another person eating meals?: None Help from another person taking care of personal grooming?: None Help from another person toileting, which includes using toliet, bedpan, or urinal?: A  Lot Help from another person bathing (including washing, rinsing, drying)?: A Lot Help from another person to put on and taking off regular upper body clothing?: A Little Help from another person to put on and taking off regular lower body clothing?: A Lot 6 Click Score: 17    End of Session Equipment Utilized During Treatment: Rolling walker (2 wheels)  OT Visit Diagnosis: Unsteadiness on feet (R26.81);Other abnormalities of gait and mobility (R26.89);Muscle weakness (generalized) (M62.81);Other symptoms and signs involving cognitive function   Activity Tolerance Patient tolerated treatment well   Patient Left in bed;with call bell/phone within reach   Nurse Communication          Time: 1429-1440 OT Time Calculation (min): 11 min  Charges: OT General Charges $OT Visit: 1 Visit OT Treatments $Self Care/Home Management : 8-22 mins  Jackquline Denmark, MS, OTR/L , CBIS ascom [phone]  06/01/23, 4:00 PM

## 2023-06-02 ENCOUNTER — Inpatient Hospital Stay: Admit: 2023-06-02 | Discharge: 2023-06-02 | Disposition: A | Discharge status: Home / Self Care | Attending: Medical

## 2023-06-02 DIAGNOSIS — I5042 Chronic combined systolic (congestive) and diastolic (congestive) heart failure: Secondary | ICD-10-CM

## 2023-06-02 DIAGNOSIS — A419 Sepsis, unspecified organism: Secondary | ICD-10-CM | POA: Diagnosis not present

## 2023-06-02 DIAGNOSIS — Z951 Presence of aortocoronary bypass graft: Secondary | ICD-10-CM | POA: Diagnosis not present

## 2023-06-02 DIAGNOSIS — R7989 Other specified abnormal findings of blood chemistry: Secondary | ICD-10-CM | POA: Diagnosis not present

## 2023-06-02 DIAGNOSIS — J189 Pneumonia, unspecified organism: Secondary | ICD-10-CM

## 2023-06-02 DIAGNOSIS — R6520 Severe sepsis without septic shock: Secondary | ICD-10-CM

## 2023-06-02 DIAGNOSIS — I472 Ventricular tachycardia, unspecified: Secondary | ICD-10-CM

## 2023-06-02 LAB — TROPONIN I (HIGH SENSITIVITY): Troponin I (High Sensitivity): 239 ng/L (ref <18)

## 2023-06-02 LAB — HEPARIN LEVEL (UNFRACTIONATED)
Heparin Unfractionated: >1.1 [IU]/mL — ABNORMAL HIGH (ref 0.30–0.70)
Heparin Unfractionated: >1.1 [IU]/mL — ABNORMAL HIGH (ref 0.30–0.70)

## 2023-06-02 LAB — CBC
HCT: 27.9 % — ABNORMAL LOW (ref 39.0–52.0)
Hemoglobin: 8.5 g/dL — ABNORMAL LOW (ref 13.0–17.0)
MCH: 28.1 pg (ref 26.0–34.0)
MCHC: 30.5 g/dL (ref 30.0–36.0)
MCV: 92.1 fL (ref 80.0–100.0)
Platelets: 291 10*3/uL (ref 150–400)
RBC: 3.03 MIL/uL — ABNORMAL LOW (ref 4.22–5.81)
RDW: 19.3 % — ABNORMAL HIGH (ref 11.5–15.5)
WBC: 8.5 10*3/uL (ref 4.0–10.5)
nRBC: 0 % (ref 0.0–0.2)

## 2023-06-02 LAB — APTT
aPTT: >200 s (ref 24–36)
aPTT: 55 s — ABNORMAL HIGH (ref 24–36)
aPTT: 65 s — ABNORMAL HIGH (ref 24–36)

## 2023-06-02 LAB — BASIC METABOLIC PANEL
Anion gap: 5 (ref 5–15)
BUN: 23 mg/dL (ref 8–23)
CO2: 26 mmol/L (ref 22–32)
Calcium: 8.3 mg/dL — ABNORMAL LOW (ref 8.9–10.3)
Chloride: 103 mmol/L (ref 98–111)
Creatinine, Ser: 0.79 mg/dL (ref 0.61–1.24)
GFR, Estimated: >60 mL/min (ref >60)
Glucose, Bld: 89 mg/dL (ref 70–99)
Potassium: 4.1 mmol/L (ref 3.5–5.1)
Sodium: 134 mmol/L — ABNORMAL LOW (ref 135–145)

## 2023-06-02 LAB — MAGNESIUM: Magnesium: 1.9 mg/dL (ref 1.7–2.4)

## 2023-06-02 LAB — DIGOXIN LEVEL: Digoxin Level: 0.6 ng/mL — ABNORMAL LOW (ref 0.8–2.0)

## 2023-06-02 MED ORDER — SPIRONOLACTONE 12.5 MG HALF TABLET
12.5000 mg | ORAL_TABLET | Freq: Every day | ORAL | Status: DC
  Administered 2023-06-02 – 2023-06-08 (×7): 12.5 mg via ORAL
  Filled 2023-06-02 (×9): qty 1

## 2023-06-02 MED ORDER — HEPARIN BOLUS VIA INFUSION
1000.0000 [IU] | Freq: Once | INTRAVENOUS | Status: AC
  Administered 2023-06-02: 1000 [IU] via INTRAVENOUS
  Filled 2023-06-02: qty 1000

## 2023-06-02 MED ORDER — FUROSEMIDE 20 MG PO TABS
20.0000 mg | ORAL_TABLET | Freq: Every day | ORAL | Status: DC
  Administered 2023-06-02 – 2023-06-22 (×21): 20 mg via ORAL
  Filled 2023-06-02 (×21): qty 1

## 2023-06-02 MED ORDER — SODIUM CHLORIDE 0.9 % IV SOLN
100.0000 mg | Freq: Two times a day (BID) | INTRAVENOUS | Status: DC
  Administered 2023-06-02 – 2023-06-04 (×4): 100 mg via INTRAVENOUS
  Filled 2023-06-02 (×5): qty 100

## 2023-06-02 NOTE — Progress Notes (Signed)
 PHARMACY - ANTICOAGULATION CONSULT NOTE  Pharmacy Consult for heparin Indication: chest pain/ACS    (apixaban PTA)  No Known Allergies  Patient Measurements: Height: 5\' 8"  (172.7 cm) Weight: 66.3 kg (146 lb 2.6 oz) IBW/kg (Calculated) : 68.4 Heparin Dosing Weight: 66.3 kg  Vital Signs: BP: 120/67 (03/22 0600) Pulse Rate: 64 (03/22 0600)  Labs: Recent Labs    06/01/23 1631 06/01/23 1809 06/02/23 0335 06/02/23 0533  HGB 10.5*  --  8.5*  --   HCT 33.7*  --  27.9*  --   PLT 376  --  291  --   APTT 39*  --  >200* 55*  LABPROT 21.1*  --   --   --   INR 1.8*  --   --   --   HEPARINUNFRC >1.10*  --  >1.10* >1.10*  CREATININE 0.73  --  0.79  --   TROPONINIHS 26* 146*  --   --     Estimated Creatinine Clearance: 72.5 mL/min (by C-G formula based on SCr of 0.79 mg/dL).  Medical History: Past Medical History:  Diagnosis Date   Essential hypertension    HFrEF (heart failure with reduced ejection fraction) (HCC)    Mitral regurgitation    Assessment: 78 y/o male presenting after mechanical fall. Patient has been boarding in ED pending placement, however, this evening he became altered from baseline and was found to have elevated troponins. 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 Pharmacy has been consulted to transition to heparin infusion.  Last dose of apixaban given: 5 mg @ 0955 on 3/21  Baseline labs: hgb 10.5, plt 376, INR pending, HL > 1.10, aPTT 39  3/22 0335 aPTT >200 / HL >1.10   lab recheck was ordered 3/22 0533 aPTT 55 / HL >1.10      aPTT subtherapeutic   Goal of Therapy:  Heparin level 0.3-0.7 units/ml aPTT 66-102 seconds Monitor platelets by anticoagulation protocol: Yes   Plan:  3/22 0533  aPTT 55 / HL >1.10      aPTT subtherapeutic Will order heparin bolus of 1000 units x1 Increase heparin infusion to 950 units/hour   Check 8 hour aPTT level after rate change Hgb 10.5 > 8.5  monitor Monitor aPTT and HL levels daily until correlating, then monitor HLlevels only Monitor CBC and signs/symptoms of bleeding  Thank you for involving pharmacy in this patient's care.   Bari Mantis PharmD Clinical Pharmacist 06/02/2023

## 2023-06-02 NOTE — Progress Notes (Signed)
 Physical Therapy Treatment Patient Details Name: Walter Vargas MRN: [id number] DOB: [birth year] Today's Date: 06/02/2023   History of Present Illness 78 year old male with history of GERD, hyperlipidemia, CAD status post CABG, on Eliquis and amiodarone, carotid enterectomy, who presented to emergency department after a f fall.  Patient was pending discharge to skilled nursing facility in the ED since 05/24/2023, but was noted to have hypoxic respite failure, desatting to 80% on room air.  Now admitted with pneumonia.    PT Comments  Pt laying in bed on arrival, reports being sleepy but willing to get up and do some walking with light cuing.  Pt, however, was much weaker and more limited than prior PT session and struggled with getting to standing and maintaining balance with any weight shift or attempts at stepping.  He needed direct assist to keep from falling backward and to the R multiple times and clearly did not have the strength/stability to do any activity away from EOB.  Pt's O2 did remain in the low 90s, HR stayed below 100 but pt fatigued, unsteady and functionally limited despite much cuing and assistance with standing EOB activity.  Pt will benefit from ongoing PT, continue with POC.    If plan is discharge home, recommend the following: A lot of help with walking and/or transfers;A lot of help with bathing/dressing/bathroom   Can travel by private vehicle     No  Equipment Recommendations  Other (comment)    Recommendations for Other Services       Precautions / Restrictions Precautions Precautions: Fall Recall of Precautions/Restrictions: Impaired Precaution/Restrictions Comments: impulsive, delayed processing Restrictions Weight Bearing Restrictions Per Provider Order: No     Mobility  Bed Mobility Overal bed mobility: Needs Assistance Bed Mobility: Supine to Sit, Sit to Supine     Supine to sit: Min assist Sit to supine: Mod assist   General bed mobility comments:  Pt showed good effort but needed direct assist with getting up from and back to supine    Transfers Overall transfer level: Needs assistance Equipment used: Rolling walker (2 wheels) Transfers: Sit to/from Stand Sit to Stand: Mod assist           General transfer comment: despite plenty of repeated and multi-modal cuing for set up, hand placement, sequencing, etc pt struggled to rise on multiple attempts.  ModA with both successful standing attempts    Ambulation/Gait Ambulation/Gait assistance: Mod assist, Max assist Gait Distance (Feet): 4 Feet Assistive device: Rolling walker (2 wheels)         General Gait Details: Pt showed confidence (misplaced) with the initial few steps but struggled with consistent AD use, steppage and repeatedly had significant lateral leans needing direct assist to insure safety.  We trialed taking a few marching steps at EOB in walker but he similarly struggled with this showing unsteadiness, needing direct assist to stay upright and struggling to make cued adjustements   Stairs             Wheelchair Mobility     Tilt Bed    Modified Rankin (Stroke Patients Only)       Balance Overall balance assessment: Needs assistance Sitting-balance support: Feet supported Sitting balance-Leahy Scale: Good   Postural control: Posterior lean Standing balance support: Bilateral upper extremity supported, During functional activity, Reliant on assistive device for balance Standing balance-Leahy Scale: Poor Standing balance comment: unable to maintain balance w/o constant direct assist  Communication Communication Communication: Impaired Factors Affecting Communication: Hearing impaired  Cognition Arousal: Lethargic Behavior During Therapy: WFL for tasks assessed/performed   PT - Cognitive impairments: No family/caregiver present to determine baseline, Orientation, Awareness, Memory, Attention,  Initiation, Problem solving, Safety/Judgement, Sequencing                       PT - Cognition Comments: often with non sequitur statements but able to redirect with gentle cuing Following commands: Impaired Following commands impaired: Follows one step commands with increased time, Follows one step commands inconsistently    Cueing Cueing Techniques: Verbal cues, Gestural cues, Tactile cues  Exercises      General Comments General comments (skin integrity, edema, etc.): Pt had wet cough, bloody - nursing notified      Pertinent Vitals/Pain Pain Assessment Pain Assessment: No/denies pain    Home Living                          Prior Function            PT Goals (current goals can now be found in the care plan section) Progress towards PT goals: Not progressing toward goals - comment (much more functionally limited today than last PT session)    Frequency    Min 2X/week      PT Plan      Co-evaluation              AM-PAC PT "6 Clicks" Mobility   Outcome Measure  Help needed turning from your back to your side while in a flat bed without using bedrails?: A Little Help needed moving from lying on your back to sitting on the side of a flat bed without using bedrails?: A Little Help needed moving to and from a bed to a chair (including a wheelchair)?: A Lot Help needed standing up from a chair using your arms (e.g., wheelchair or bedside chair)?: A Lot Help needed to walk in hospital room?: A Lot Help needed climbing 3-5 steps with a railing? : Total 6 Click Score: 13    End of Session Equipment Utilized During Treatment: Gait belt Activity Tolerance: Patient limited by fatigue Patient left: with bed alarm set;with call bell/phone within reach Nurse Communication: Mobility status PT Visit Diagnosis: Other abnormalities of gait and mobility (R26.89);Difficulty in walking, not elsewhere classified (R26.2);Muscle weakness (generalized)  (M62.81);Unsteadiness on feet (R26.81);Repeated falls (R29.6)     Time: 1610-9604 PT Time Calculation (min) (ACUTE ONLY): 17 min  Charges:    $Therapeutic Activity: 8-22 mins PT General Charges $$ ACUTE PT VISIT: 1 Visit                     Malachi Pro, DPT 06/02/2023, 5:32 PM

## 2023-06-02 NOTE — Progress Notes (Signed)
 PHARMACY - ANTICOAGULATION CONSULT NOTE  Pharmacy Consult for heparin Indication: chest pain/ACS    (apixaban PTA)  No Known Allergies  Patient Measurements: Height: 5\' 8"  (172.7 cm) Weight: 66.3 kg (146 lb 2.6 oz) IBW/kg (Calculated) : 68.4 Heparin Dosing Weight: 66.3 kg  Vital Signs: Temp: 97.6 F (36.4 C) (03/22 1445) Temp Source: Oral (03/22 1445) BP: 113/64 (03/22 1445) Pulse Rate: 64 (03/22 1445)  Labs: Recent Labs    06/01/23 1631 06/01/23 1809 06/02/23 0335 06/02/23 0533 06/02/23 0902 06/02/23 1550  HGB 10.5*  --  8.5*  --   --   --   HCT 33.7*  --  27.9*  --   --   --   PLT 376  --  291  --   --   --   APTT 39*  --  >200* 55*  --  65*  LABPROT 21.1*  --   --   --   --   --   INR 1.8*  --   --   --   --   --   HEPARINUNFRC >1.10*  --  >1.10* >1.10*  --   --   CREATININE 0.73  --  0.79  --   --   --   TROPONINIHS 26* 146*  --   --  239*  --     Estimated Creatinine Clearance: 72.5 mL/min (by C-G formula based on SCr of 0.79 mg/dL).  Medical History: Past Medical History:  Diagnosis Date   Essential hypertension    HFrEF (heart failure with reduced ejection fraction) (HCC)    Mitral regurgitation    Assessment: 78 y/o male presenting after mechanical fall. Patient has been boarding in ED pending placement, however, this evening he became altered from baseline and was found to have elevated troponins. 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 Pharmacy has been consulted to transition to heparin infusion.  Last dose of apixaban given: 5 mg @ 0955 on 3/21  Baseline labs: hgb 10.5, plt 376, INR pending, HL > 1.10, aPTT 39  3/22 0335 aPTT >200 / HL >1.10   lab recheck was ordered 3/22 0533 aPTT 55 / HL >1.10      aPTT subtherapeutic 3/22 1550 aPTT 65         aPTT subtherapeutic  Goal of Therapy:  Heparin level 0.3-0.7  units/ml aPTT 66-102 seconds Monitor platelets by anticoagulation protocol: Yes   Plan:  Give heparin bolus of 1000 units x1 Increase heparin infusion to 1050 units/hour  Check 8 hour aPTT level after rate change Hgb 10.5 > 8.5  monitor Monitor aPTT and HL levels daily until correlating, then monitor HLlevels only Monitor CBC and signs/symptoms of bleeding  Thank you for involving pharmacy in this patient's care.   Rockwell Alexandria, PharmD Clinical Pharmacist 06/02/2023 4:42 PM

## 2023-06-02 NOTE — Consult Note (Signed)
 Cardiology Consultation   Patient ID: Walter Vargas MRN: [id number]; DOB: Aug 28, 1945  Admit date: 05/24/2023 Date of Consult: 06/02/2023  PCP:  Walter Emms, NP   [facility] Providers Cardiologist:  [facility]    Patient Profile:   Walter Vargas is a 78 y.o. 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  History of Present Illness:   Walter Vargas presented to the ER 02/10/2023 with increasing shortness of breath over the last few months.  They also noted altered mental status.  In the ED BNP 4500 with a high-sensitivity troponin of 13,000.  EKG showed normal sinus rhythm with LVH and secondary repolarization abnormality.  Cranial CT scan was negative for acute changes.  CT of the chest showed pulmonary edema and small pleural effusions.  He was given IV Lasix and IV heparin.  CT of the abdomen and pelvis showed distended gallbladder but otherwise unremarkable.  HIDA scan ruled out cholecystitis.  Cardiac cath showed LVEF less than 20%, global hypokinesis and mild concentric LVH, mildly reduced RV function, moderate mitral insufficiency, moderate tricuspid insufficiency.  Ultrasound of the carotid showed 99% blockage of the left ICA.  Initial plan was for CABG however this was delayed due to COVID-19 positive undergoing treatment which was completed.  Patient was cleared for surgery and underwent insertion of Impella ventricular assisted device 03/01/2023 followed by CABG times 07/23/2022 complicated by postop bleeding with reexploration 03/02/2023.  Patient was slowly extubated.  Postop chest tubes removed.  Impella removed  1230.  Patient had acute blood loss anemia around 9.5 and was transferred for units fresh frozen plasma, 2 units cryo and 2 platelets.  Initially placed on Zosyn for low-grade fever and suspected aspiration pneumonia.  He developed some cellulitis of the right vein harvest site after CABG but completed course of Ancef.  He had bouts of agitation and restlessness placed on Seroquel.  Was discharged to rehab program.  Patient had increasing confusion ongoing cognitive decline.  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  After discussion with neurologist, it was decided to have patient undergo left CEA. The patient underwent left carotid endarterectomy with bovine patch angioplasty on 04/25/23.  He was discharged to [facility] 05/04/2023.  Patient was brought to the ER 05/24/2023 for a fall at a nursing home. Plan was to discharge to SNF, however patient spent days in the ER waiting for a plan from social work team for about 7 days.  On 06/02/2023 he was confused and trying to out of bed.  He was found to be hypoxic to 80% on room air and tachypneic.  Oxygen improved on 4 L nasal cannula.  Vitals in the ED showed temperature 98.9, respiratory rate 26, 94 bpm, blood pressure 125/70, 80% O2 on 3 L O2.  BNP 3074. HS trop [phone]. LA 2.6>1.8 Scr 0.73, BUN 22, albumin 3.1, Hgb 10.5, WBC 12. Resp panel negative. EGK shows NSR, IVCD, anterolateral q waves, LVH wih repol. CXR showed new diffuse interstitial opacities in the upper lung fields, edema and possible PNA. He was started on antibiotics and given IVF.  On my interview, he  denies chest pain or SOB. Reports he has neck pain.    Past Medical History:  Diagnosis Date   Essential hypertension    HFrEF (heart failure with reduced ejection fraction) (HCC)    Mitral regurgitation     Past Surgical History:  Procedure Laterality Date   CORONARY ARTERY BYPASS GRAFT N/A 03/01/2023   Procedure: CORONARY ARTERY BYPASS GRAFTING X 5, USING  LEFT INTERNAL MAMMARY ARTERY AND ENDOSCOPICALLY HARVESTED RIGHT SAPHENOUS VEIN GRAFT;  Surgeon: Walter Slot, MD;  Location: [facility] OR;  Service: Open Heart Surgery;  Laterality: N/A;   ENDARTERECTOMY Left 04/25/2023   Procedure: ENDARTERECTOMY CAROTID USING 1X6 CM XENOSURE BIOLOGIC PATCH;  Surgeon: Walter Douglas, MD;  Location: [facility] OR;  Service: Vascular;  Laterality: Left;   EXPLORATION POST OPERATIVE OPEN HEART N/A 03/01/2023   Procedure: EXPLORATION POST OPERATIVE OPEN HEART;  Surgeon: Walter Slot, MD;  Location: [facility] OR;  Service: Open Heart Surgery;  Laterality: N/A;   NO PAST SURGERIES     PLACEMENT OF IMPELLA LEFT VENTRICULAR ASSIST DEVICE  03/01/2023   Procedure: PLACEMENT OF IMPELLA LEFT VENTRICULAR ASSIST DEVICE;  Surgeon: Walter Slot, MD;  Location: [facility] OR;  Service: Open Heart Surgery;;   REMOVAL OF IMPELLA LEFT VENTRICULAR ASSIST DEVICE N/A 03/12/2023   Procedure: REMOVAL OF IMPELLA LEFT VENTRICULAR ASSIST DEVICE;  Surgeon: Walter Slot, MD;  Location: [facility] OR;  Service: Open Heart Surgery;  Laterality: N/A;   RIGHT/LEFT HEART CATH AND CORONARY ANGIOGRAPHY N/A 02/13/2023   Procedure: RIGHT/LEFT HEART CATH AND CORONARY ANGIOGRAPHY;  Surgeon: Walter Kendall, MD;  Location: [facility] INVASIVE CV LAB;  Service: Cardiovascular;  Laterality: N/A;   TEE WITHOUT CARDIOVERSION N/A 03/01/2023   Procedure: TRANSESOPHAGEAL ECHOCARDIOGRAM (TEE);  Surgeon: Walter Slot, MD;  Location: [facility] OR;  Service: Open Heart Surgery;  Laterality: N/A;   TEE WITHOUT CARDIOVERSION N/A 03/12/2023   Procedure: TRANSESOPHAGEAL ECHOCARDIOGRAM (TEE);  Surgeon: Walter Slot, MD;  Location: [facility] OR;  Service: Open Heart Surgery;  Laterality: N/A;     Home Medications:  Prior to Admission medications   Medication Sig Start Date End Date Taking? Authorizing Provider  amiodarone (PACERONE) 200 MG tablet Take 1 tablet (200 mg total) by mouth daily. 04/24/23  Yes Angiulli, Mcarthur Rossetti, PA-C  apixaban (ELIQUIS) 5 MG TABS tablet Take 1 tablet (5 mg total) by mouth 2 (two) times daily. 05/02/23  Yes Rhyne, Ames Coupe, PA-C  ascorbic acid (VITAMIN C) 500 MG tablet Take 1 tablet (500 mg total) by mouth daily. 04/24/23  Yes Angiulli, Mcarthur Rossetti, PA-C  aspirin 81 MG chewable tablet Chew 1 tablet (81 mg total) by mouth daily. 04/24/23  Yes Angiulli, Mcarthur Rossetti, PA-C  atorvastatin (LIPITOR) 80 MG tablet Take 1 tablet (80 mg total) by mouth daily. 02/15/23  Yes Leeroy Bock, MD  digoxin (LANOXIN) 0.125 MG tablet Take 1 tablet (0.125 mg total) by mouth daily. 04/24/23  Yes Angiulli, Mcarthur Rossetti, PA-C  docusate (COLACE) 50 MG/5ML liquid Take 10 mLs (100 mg total) by mouth 2 (two) times daily. 04/24/23  Yes Angiulli, Mcarthur Rossetti, PA-C  hydrOXYzine (ATARAX) 25 MG tablet Take 1 tablet (25 mg total) by mouth 3 (three) times daily as needed for anxiety. 04/24/23  Yes Angiulli, Mcarthur Rossetti, PA-C  Multiple Vitamins-Minerals (MULTIVITAMIN MEN 50+) TABS Take 1 tablet by mouth daily.   Yes [provider]  pantoprazole (PROTONIX) 40 MG tablet Take 1 tablet (40 mg total) by mouth 2 (two) times daily before a meal.  04/24/23  Yes Angiulli, Mcarthur Rossetti, PA-C    Inpatient Medications: Scheduled Meds:  amiodarone  200 mg Oral Daily   ascorbic acid  500 mg Oral Daily   aspirin  81 mg Oral Daily   atorvastatin  80 mg Oral Daily   digoxin  0.125 mg Oral Daily   pantoprazole  40 mg Oral BID AC   Continuous Infusions:  ceFEPime (MAXIPIME) IV Stopped (06/02/23 1038)   doxycycline (VIBRAMYCIN) IV     heparin 950 Units/hr (06/02/23 0816)   vancomycin Stopped (06/02/23 0802)   PRN Meds: acetaminophen **OR** acetaminophen, hydrOXYzine, ondansetron **OR** ondansetron (ZOFRAN) IV, senna-docusate  Allergies:   No Known Allergies  Social History:   Social History   Socioeconomic History   Marital status: Single    Spouse name: Not on file   Number of children: Not on file   Years of education: Not on file    Highest education level: Not on file  Occupational History   Not on file  Tobacco Use   Smoking status: Former    Current packs/day: 0.00    Average packs/day: 0.5 packs/day for 10.0 years (5.0 ttl pk-yrs)    Types: Cigarettes    Start date: 60    Quit date: 56    Years since quitting: 47.2   Smokeless tobacco: Never  Vaping Use   Vaping status: Never Used  Substance and Sexual Activity   Alcohol use: Not Currently    Comment: no alcohol since 78 years old   Drug use: Never   Sexual activity: Not Currently  Other Topics Concern   Not on file  Social History Narrative   Not on file   Social Drivers of Health   Financial Resource Strain: Not on file  Food Insecurity: No Food Insecurity (06/01/2023)   Hunger Vital Sign    Worried About Running Out of Food in the Last Year: Never true    Ran Out of Food in the Last Year: Never true  Transportation Needs: No Transportation Needs (06/01/2023)   PRAPARE - Administrator, Civil Service (Medical): No    Lack of Transportation (Non-Medical): No  Physical Activity: Not on file  Stress: Not on file  Social Connections: Socially Isolated (06/01/2023)   Social Connection and Isolation Panel [NHANES]    Frequency of Communication with Friends and Family: Twice a week    Frequency of Social Gatherings with Friends and Family: Once a week    Attends Religious Services: Never    Database administrator or Organizations: No    Attends Banker Meetings: Never    Marital Status: Divorced  Catering manager Violence: Not At Risk (06/01/2023)   Humiliation, Afraid, Rape, and Kick questionnaire    Fear of Current or Ex-Partner: No    Emotionally Abused: No    Physically Abused: No    Sexually Abused: No    Family History:    Family History  Problem Relation Age of Onset   Diabetes Mother        gestational diabetes   Hypertension Father    Alcohol abuse Father    Heart disease Brother    Alcohol abuse  Brother    Drug abuse Brother      ROS:  Please see the history of present illness.   All other ROS reviewed and negative.     Physical Exam/Data:   Vitals:   06/02/23 0400 06/02/23 0600 06/02/23 0900 06/02/23 0957  BP: (!) 98/52 120/67 [name])  115/57   Pulse: 67 64 (!) 57 60  Resp: (!) 25 (!) 21 20   Temp:      TempSrc:      SpO2: 93% 98% 98%   Weight:      Height:        Intake/Output Summary (Last 24 hours) at 06/02/2023 1101 Last data filed at 06/02/2023 0500 Gross per 24 hour  Intake 100 ml  Output 1200 ml  Net -1100 ml      06/01/2023    7:04 PM 05/24/2023    8:46 PM 05/09/2023    1:19 PM  Last 3 Weights  Weight (lbs) 146 lb 2.6 oz 146 lb 2.6 oz 122 lb  Weight (kg) 66.3 kg 66.3 kg 55.339 kg     Body mass index is 22.22 kg/m.  General:  chronically ill elderly male HEENT: normal Neck: + JVD Vascular: No carotid bruits; Distal pulses 2+ bilaterally Cardiac:  normal S1, S2; RR; no murmur  Lungs:  clear to auscultation bilaterally, no wheezing, rhonchi or rales  Abd: soft, nontender, no hepatomegaly  Ext: no edema Musculoskeletal:  No deformities, BUE and BLE strength normal and equal Skin: warm and dry  Neuro:  CNs 2-12 intact, no focal abnormalities noted Psych:  Normal affect    Telemetry:  Telemetry was personally reviewed and demonstrates:  bradycardia HR 50-60s  Relevant CV Studies:  Echo 04/18/23 1. Left ventricular ejection fraction, by estimation, is 20 to 25%. The  left ventricle has severely decreased function. The left ventricle  demonstrates global hypokinesis with septal-lateral dyssynchrony  consistent with IVCD. The left ventricular  internal cavity size was mildly dilated. Left ventricular diastolic  parameters are consistent with Grade II diastolic dysfunction  (pseudonormalization).   2. Right ventricular systolic function is mildly reduced. The right  ventricular size is normal. Tricuspid regurgitation signal is inadequate  for assessing  PA pressure.   3. Left atrial size was moderately dilated.   4. Right atrial size was mildly dilated.   5. The mitral valve is normal in structure. Mild mitral valve  regurgitation. No evidence of mitral stenosis.   6. The aortic valve is tricuspid. Aortic valve regurgitation is not  visualized. No aortic stenosis is present.   7. The inferior vena cava is normal in size with greater than 50%  respiratory variability, suggesting right atrial pressure of 3 mmHg.   Intra-op TEE 02/2023  Left Ventricle: The left ventricle 20. The cavity size was severely  dilated. There is no increase in left ventricular wall thickness. Left  ventricular diffuse hypokinesis.    Right Ventricle: The right ventricle has moderately reduced systolic  function. The cavity was dialated. There is no increase in right  ventricular wall thickness.   Left Atrium: Left atrial size was not assessed. No left atrial/left atrial  appendage thrombus was detected.   Right Atrium: Right atrial size was not assessed.   Interatrial Septum: No atrial level shunt detected by color flow Doppler.   Pericardium: There is no evidence of pericardial effusion.   Mitral Valve: The mitral valve is normal in structure. Mitral valve  regurgitation is trivial by color flow Doppler. There is No evidence of  mitral stenosis.   Tricuspid Valve: The tricuspid valve was normal in structure. Tricuspid  valve regurgitation is trivial by color flow Doppler.   Aortic Valve: The aortic valve is normal in structure. Aortic valve  regurgitation was not visualized by color flow Doppler. There is no  stenosis of the  aortic valve.    Pulmonic Valve: The pulmonic valve was normal in structure.  Pulmonic valve regurgitation is trivial by color flow Doppler.   Echo 02/14/24  1. Left ventricular ejection fraction, by estimation, is <20%. The left  ventricle has severely decreased function. The left ventricle demonstrates  global hypokinesis.  There is mild concentric left ventricular hypertrophy.  Left ventricular diastolic  parameters are indeterminate.   2. Right ventricular systolic function is moderately reduced. The right  ventricular size is normal. There is severely elevated pulmonary artery  systolic pressure.   3. Left atrial size was severely dilated.   4. Right atrial size was severely dilated.   5. The mitral valve is normal in structure. Moderate mitral valve  regurgitation. No evidence of mitral stenosis.   6. Tricuspid valve regurgitation is moderate.   7. Abdominal aorta is normal sized. The aortic valve has an indeterminant  number of cusps. Aortic valve regurgitation is not visualized. No aortic  stenosis is present.   8. The inferior vena cava is dilated in size with <50% respiratory  variability, suggesting right atrial pressure of 15 mmHg.   R/L heart cath 02/2023 Conclusions: Severe three-vessel coronary artery disease, as detailed below. Normal left and right heart filling pressures. Mild-moderately reduced Fick cardiac output/index.   Recommendations: Cardiac surgery consultation for CABG +/- MVR.  Patient wishes to discuss with his son before agreeing to transfer. Resume heparin infusion 2 hours after TR band removal. Aggressive secondary prevention of coronary artery disease. Maintain net even fluid balance.  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   Walter Kendall, MD [facility]   Echo 01/2023  1. Left ventricular ejection fraction, by estimation, is <20%. The left  ventricle has severely decreased function. The left ventricle demonstrates  global hypokinesis. The left ventricular internal cavity size was mildly  dilated. Left ventricular  diastolic parameters are indeterminate.   2. Right ventricular systolic function is moderately reduced. The right  ventricular size is mildly enlarged. There is moderately elevated  pulmonary  artery systolic pressure.   3. Left atrial size was mild to moderately dilated.   4. Right atrial size was mildly dilated.   5. Moderate pleural effusion in the left lateral region.   6. The mitral valve is normal in structure. Moderate to severe mitral  valve regurgitation. No evidence of mitral stenosis.   7. The aortic valve is normal in structure. Aortic valve regurgitation is  not visualized. Aortic valve sclerosis/calcification is present, without  any evidence of aortic stenosis.   8. The inferior vena cava is dilated in size with <50% respiratory  variability, suggesting right atrial pressure of 15 mmHg.   Laboratory Data:  High Sensitivity Troponin:   Recent Labs  Lab 05/24/23 2104 05/24/23 2321 06/01/23 1631 06/01/23 1809 06/02/23 0902  TROPONINIHS 30* 33* 26* 146* 239*     Chemistry Recent Labs  Lab 06/01/23 1631 06/02/23 0141 06/02/23 0335  NA 136  --  134*  K 5.0  --  4.1  CL 105  --  103  CO2 22  --  26  GLUCOSE 120*  --  89  BUN 22  --  23  CREATININE 0.73  --  0.79  CALCIUM 8.5*  --  8.3*  MG  --  1.9  --   GFRNONAA >60  --  >60  ANIONGAP 9  --  5    Recent Labs  Lab 06/01/23 1631  PROT 6.9  ALBUMIN 3.1*  AST 23  ALT 22  ALKPHOS 78  BILITOT 1.1   Lipids No results for input(s): "CHOL", "TRIG", "HDL", "LABVLDL", "LDLCALC", "CHOLHDL" in the last 168 hours.  Hematology Recent Labs  Lab 06/01/23 1631 06/02/23 0335  WBC 12.0* 8.5  RBC 3.70* 3.03*  HGB 10.5* 8.5*  HCT 33.7* 27.9*  MCV 91.1 92.1  MCH 28.4 28.1  MCHC 31.2 30.5  RDW 19.3* 19.3*  PLT 376 291   Thyroid No results for input(s): "TSH", "FREET4" in the last 168 hours.  BNP Recent Labs  Lab 06/01/23 1631  BNP 3,074.9*    DDimer No results for input(s): "DDIMER" in the last 168 hours.   Radiology/Studies:  DG Chest Portable 1 View Result Date: 06/01/2023 CLINICAL DATA:  Shortness of breath.  Dyspnea.  Cough. EXAM: PORTABLE CHEST 1 VIEW COMPARISON:  05/09/2023 FINDINGS:  Prior CABG again noted. New diffuse interstitial opacities in the upper lung fields, and atelectasis or consolidation in the lower lung fields. Increased bilateral pleural effusions also seen. IMPRESSION: New diffuse interstitial opacities in the upper lung fields, and atelectasis or consolidation in the lower lung fields. This is likely due to diffuse edema, although superimposed pneumonia cannot be excluded. Bilateral pleural effusions. Electronically Signed   By: Danae Orleans M.D.   On: 06/01/2023 17:50     Assessment and Plan:   HFrEF - 01/2023 presented with NSTEMI found to have diffuse CAD and underwent CABG x5 requiring impella placement. Pre-CABG echo<20% moderately reduced RV. Intra op TEE showed EF20%. - POCUS 12/25 showed LVEF 25-30% improved RV - Echo 2/7 LVEF 20-25%, global HK, G2DD, mildly reduced RV, mild MR - discharged to CIR on on lasix 20mg  daily, Farxiga 10mg  daily, spiro 12.5mg  daily, losartan 12,5mg  daily however appears PTA he was only on digoxin. Notes suggest during CIR GDMT not tolerated d/t orthostasis/hypotension - came in for a fall 3/13 and has been in the ER awaiting placement to SNF found to have severe sepsis, possible PNA, elevated troponin, elevated BNP - PTA digoxin 0.125mg  daily, check a dig level - Bps intermittently low and pulse in the 50-60s - JVD noted, but does not appear significantly volume up - can repeat echo and have heart failure continue to follow  Severe Sepsis Possible PNA - abx per IM  Elevated troponin CAD s/p CABG x5 03/01/24 - Hs trop elevated 26>146>239 started on IV heparin - no chest pain reported - suspect supply demand mismatch, continue to trend - can repeat echo - continue ASA 81mg  daily and Plavix 75mg  daily and Lipitor 80mg  daily - continue IV heparin x 48 hours  Carotid artery stenosis s/p L endarterectomy - performed 04/25/23 by [facility]  H/o VT per-CABG - continue amiodarone 200mg  daily  H/o CVA - ASA and Plavix  For  questions or updates, please contact [facility] Please consult [email or web address] for contact info under    Signed, [name] David Stall, PA-C  06/02/2023 11:01 AM

## 2023-06-02 NOTE — Plan of Care (Signed)

## 2023-06-02 NOTE — Progress Notes (Addendum)
 Progress Note   Patient: Walter Vargas WUJ:[id number] DOB: 05/16/1945 DOA: 05/24/2023     1 DOS: the patient was seen and examined on 06/02/2023   Brief hospital course: HPI on admission 06/01/23: "Walter Vargas is a 78 year old male with history of GERD, hyperlipidemia, CAD status post CABG, on Eliquis and amiodarone, carotid enterectomy, who presents emergency department for chief concerns of falling.   Patient was pending discharge to skilled nursing facility in the ED since 05/24/2023.   On day of admission, patient was noted to have hypoxic respite failure, desatting to 80% on room air.  Patient was placed on 6 L nasal cannula with improvement to 94%.  Patient has been titrated down to 3 L nasal cannula at this time with SpO2 100% on room air.  And during hypoxic episode, patient was noted to be altered and trying to pull out leads and oxygen supplementation.  Patient is currently more redirectable.   Vitals in the ED showed temperature of 98.9, respiration rate of 26, heart rate 94, blood pressure 125/70, SpO2 of 80% on room air, currently improved to 1 high percent on 3 L nasal cannula...." See H&P for full HPI on admission & ED course.  Patient was admitted for further evaluation and management of severe sepsis due  multifocal pneumonia complicated by acute respiratory failure with hypoxia.  Further hospital course and management as outlined below.    Assessment and Plan:   Severe sepsis due to Multifocal Pneumonia, suspect hospital-acquired Pt was boarding in the ED awaiting discharge to SNF for 7 days prior to admission. Severe sepsis evidenced by tachypnea, leukocytosis, lactic acidosis with pulmonary and cardiac organ involvement in setting of acute hypoxic respiratory failure --Continue Vancomymin, Cefepime  --Change Zithromax >> Doxycycline (to avoid prolong QT in pt on amiodarone) --Mucolytics, bronchodilators and other supportive care per orders --IS and flutter --Follow  cultures --Monitor fever curve, CBC, hemodynamics  Acute respiratory failure with hypoxia - due to PNA, pleural effusions, ?CHF --Mgmt of underlying issues as outlined --O2 per protocol, wean as tolerated, target sats > 90%  Chronic combined systolic and diastolic CHF  History of VT (peri-CABG surgery) Abnormal EKG Elevated Troponins likely Demand Ischemia in setting of sepsis and hypoxia Echo 04/20/2023: EF ~ 20--25%, grade II DD Given Lasix 40 mg IV x 1 on admission BNP elevated 3075 (note baseline BNP appears ~2200+) --Cardiology consulted for recommendations --Continue home digoxin, amiodarone --Further IV diuresis PRN --Resumed home Lasix 20 mg PO daily --Repeat Echo ordered --Daily weights, strict Io's --1500 cc/day fluid restriction --Trend troponin to peak --Heparin drip for now pending above evaluation and cardiology input --Stat EKG and repeat troponin if chest pain --Maintain K>4, Mg>2 for hx of VT  Bilateral pleural effusions --Strict I's and O's --Will consider thoracentesis if needed  Hyperlipidemia --Lipitor  CAD - stable S/P CABG x 5 --Continue aspirin and Lipitor   Protein-calorie malnutrition, severe --Dietitian consulted        Subjective: Pt seen in ED holding for a bed, sleeping but responsive to voice.  Remains confused, oriented to self.  States year is "48 or 26", states this building is a hotel.  Reports mild shortness of breath, cough, no fever no chills, no chest pain.  Physical Exam: Vitals:   06/02/23 0600 06/02/23 0900 06/02/23 0957 06/02/23 1200  BP: 120/67 (!) 115/57  (!) 108/47  Pulse: 64 (!) 57 60 (!) 54  Resp: (!) 21 20  16   Temp:      TempSrc:  SpO2: 98% 98%  100%  Weight:      Height:       General exam: awake, alert, no acute distress, frail, chronically ill appearing HEENT: wearing eye mask, moist mucus membranes, hearing grossly normal  Respiratory system: coarse lung sounds, no wheezes, normal respiratory  effort at rest on 1 L/min [place] O2. Cardiovascular system: normal S1/S2, RRR, no JVD, no pedal edema.   Gastrointestinal system: soft, NT, ND, no HSM felt, +bowel sounds. Central nervous system: A&O x 1. no gross focal neurologic deficits, normal speech Extremities: moves all, no edema, normal tone Skin: dry, intact, normal temperature Psychiatry: normal mood, congruent affect   Data Reviewed:  Notable labs --  Troponin 26 >> 146 >> 239 WBC 12 >> 8.5 normalized Lactic acid normalized 2.6 >> 1.8 Na 134, Ca 8.3 otherwise normal BMP  Family Communication: None present, will attempt to call as time allows  Disposition: Status is: Inpatient Remains inpatient appropriate because: severity of illness as above, on IV therapies, ongoing evaluation   Planned Discharge Destination: Skilled nursing facility    Time spent: 52 minutes including time at bedside and in coordination of care with staff and consultants  Author: Pennie Banter, DO 06/02/2023 2:40 PM  For on call review [email or web address].

## 2023-06-02 NOTE — Progress Notes (Signed)
       CROSS COVER NOTE  NAME: Walter Vargas MRN: [id number] DOB : 07/14/1945 ATTENDING PHYSICIAN: Cox, Amy Dorris Carnes, DO    Date of Service   06/02/2023   HPI/Events of Note   Increased agitation and confusion Paranoid compounding ability to redirect and reorient  Interventions   Assessment/Plan:    06/02/2023    1:00 AM 06/01/2023    9:00 PM 06/01/2023    8:00 PM  Vitals with BMI  Systolic 105 116 409  Diastolic 72 66 60  Pulse 59 64 65   Afebrile Labs reviewed. BNP over 3000 CXR indicates pleural effusions and pulmonary edema EKG -  SR IVCD and QTC 508  Valium 2.5 mg IV Furosemide 40 mg IV x1 Delirium precautions       Donnie Mesa NP [facility] Cross Cover 7pm-7am - check amion for availability Pager [phone]

## 2023-06-03 DIAGNOSIS — Z951 Presence of aortocoronary bypass graft: Secondary | ICD-10-CM | POA: Diagnosis not present

## 2023-06-03 DIAGNOSIS — I5042 Chronic combined systolic (congestive) and diastolic (congestive) heart failure: Secondary | ICD-10-CM | POA: Diagnosis not present

## 2023-06-03 DIAGNOSIS — I472 Ventricular tachycardia, unspecified: Secondary | ICD-10-CM | POA: Diagnosis not present

## 2023-06-03 DIAGNOSIS — I255 Ischemic cardiomyopathy: Secondary | ICD-10-CM

## 2023-06-03 DIAGNOSIS — R7989 Other specified abnormal findings of blood chemistry: Secondary | ICD-10-CM | POA: Diagnosis not present

## 2023-06-03 DIAGNOSIS — J189 Pneumonia, unspecified organism: Secondary | ICD-10-CM | POA: Diagnosis not present

## 2023-06-03 LAB — CBC
HCT: 26.8 % — ABNORMAL LOW (ref 39.0–52.0)
Hemoglobin: 8.2 g/dL — ABNORMAL LOW (ref 13.0–17.0)
MCH: 27.7 pg (ref 26.0–34.0)
MCHC: 30.6 g/dL (ref 30.0–36.0)
MCV: 90.5 fL (ref 80.0–100.0)
Platelets: 295 10*3/uL (ref 150–400)
RBC: 2.96 MIL/uL — ABNORMAL LOW (ref 4.22–5.81)
RDW: 19.3 % — ABNORMAL HIGH (ref 11.5–15.5)
WBC: 5.1 10*3/uL (ref 4.0–10.5)
nRBC: 0 % (ref 0.0–0.2)

## 2023-06-03 LAB — BASIC METABOLIC PANEL
Anion gap: 5 (ref 5–15)
BUN: 25 mg/dL — ABNORMAL HIGH (ref 8–23)
CO2: 23 mmol/L (ref 22–32)
Calcium: 7.8 mg/dL — ABNORMAL LOW (ref 8.9–10.3)
Chloride: 106 mmol/L (ref 98–111)
Creatinine, Ser: 0.78 mg/dL (ref 0.61–1.24)
GFR, Estimated: >60 mL/min (ref >60)
Glucose, Bld: 87 mg/dL (ref 70–99)
Potassium: 3.5 mmol/L (ref 3.5–5.1)
Sodium: 134 mmol/L — ABNORMAL LOW (ref 135–145)

## 2023-06-03 LAB — MAGNESIUM: Magnesium: 1.9 mg/dL (ref 1.7–2.4)

## 2023-06-03 LAB — HEPARIN LEVEL (UNFRACTIONATED): Heparin Unfractionated: >1.1 [IU]/mL — ABNORMAL HIGH (ref 0.30–0.70)

## 2023-06-03 LAB — APTT
aPTT: 67 s — ABNORMAL HIGH (ref 24–36)
aPTT: 64 s — ABNORMAL HIGH (ref 24–36)

## 2023-06-03 MED ORDER — DM-GUAIFENESIN ER 30-600 MG PO TB12
1.0000 | ORAL_TABLET | Freq: Two times a day (BID) | ORAL | Status: DC
  Administered 2023-06-03 – 2023-06-05 (×6): 1 via ORAL
  Filled 2023-06-03 (×8): qty 1

## 2023-06-03 MED ORDER — SENNOSIDES-DOCUSATE SODIUM 8.6-50 MG PO TABS
1.0000 | ORAL_TABLET | Freq: Two times a day (BID) | ORAL | Status: DC
  Administered 2023-06-03 – 2023-06-22 (×37): 1 via ORAL
  Filled 2023-06-03 (×38): qty 1

## 2023-06-03 MED ORDER — POLYETHYLENE GLYCOL 3350 17 G PO PACK
17.0000 g | PACK | Freq: Every day | ORAL | Status: DC
  Administered 2023-06-03 – 2023-06-22 (×18): 17 g via ORAL
  Filled 2023-06-03 (×19): qty 1

## 2023-06-03 NOTE — Plan of Care (Signed)

## 2023-06-03 NOTE — Progress Notes (Signed)
   Patient Name: Walter Vargas Date of Encounter: 06/03/2023 [facility] Cardiologist: AHF team  Interval Summary  .    UOP -1L. BPS low overnight. kidney function stable. Patient with some confusion on interview, unsure where he is at or why he is in the hospital. He denies chest pain. Breathing is good. Hgb down to 8.2.   Vital Signs .    Vitals:   06/02/23 2018 06/03/23 0107 06/03/23 0310 06/03/23 0517  BP: (!) 94/59 (!) 90/49 (!) 105/59   Pulse: (!) 57 (!) 50 (!) 51   Resp: 18 18 20    Temp: 97.7 F (36.5 C) 98 F (36.7 C) 98 F (36.7 C)   TempSrc: Oral Oral Oral   SpO2: 98% 92% 97%   Weight:    56.8 kg  Height:        Intake/Output Summary (Last 24 hours) at 06/03/2023 0719 Last data filed at 06/03/2023 0618 Gross per 24 hour  Intake 830.41 ml  Output 1050 ml  Net -219.59 ml      06/03/2023    5:17 AM 06/01/2023    7:04 PM 05/24/2023    8:46 PM  Last 3 Weights  Weight (lbs) 125 lb 3.5 oz 146 lb 2.6 oz 146 lb 2.6 oz  Weight (kg) 56.8 kg 66.3 kg 66.3 kg      Telemetry/ECG    SB HR 50s, upper 40s overnight - Personally Reviewed  Physical Exam .   GEN: No acute distress.   Neck: No JVD Cardiac: braydcardia, RR, no murmurs, rubs, or gallops.  Respiratory: diminished at bases GI: Soft, nontender, non-distended  MS: No edema  Assessment & Plan .    Elevated troponin CAD s/p CABG x5 03/01/24 - 01/2023 presented with NSTEMI found to have diffuse CAD and underwent CABG x5 requiring impella placement. Came in for a fall 3/13 and has been in the ER awaiting placement to SNF found to have severe sepsis, possible PNA, elevated troponin, elevated BNP.  - Hs trop elevated [phone] started on IV heparin - no chest pain reported - suspect supply demand mismatch, continue to trend - echo ordered - continue ASA 81mg  daily and Plavix 75mg  daily and Lipitor 80mg  daily - continue IV heparin x 48 hours. Daily CBC  HFrEF - Pre-CABG echo<20% moderately reduced RV.  Intra op TEE showed EF20%. - POCUS 12/25 showed LVEF 25-30% improved RV - Echo 2/7 LVEF 20-25%, global HK, G2DD, mildly reduced RV, mild MR - discharged to CIR on on lasix 20mg  daily, Farxiga 10mg  daily, spiro 12.5mg  daily, losartan 12,5mg  daily however appears PTA he was only on digoxin. Notes suggest during CIR GDMT not tolerated d/t orthostasis/hypotension - PTA digoxin 0.125mg  daily, dig level 0.6 - Bps intermittently low and pulse in the 50-60s - restarted on spiro 12.5mg  daily and lasix 20mg  daily - JVD noted, but does not appear significantly volume up -  repeat echo    Severe Sepsis Possible PNA - abx per IM   Carotid artery stenosis s/p L endarterectomy - performed 04/25/23 by [facility]   H/o VT per-CABG - continue amiodarone 200mg  daily   H/o CVA - ASA and Plavix   For questions or updates, please contact [facility] Please consult [email or web address] for contact info under        Signed, Toyna Erisman David Stall, PA-C

## 2023-06-03 NOTE — Progress Notes (Signed)
 PHARMACY - ANTICOAGULATION CONSULT NOTE  Pharmacy Consult for heparin Indication: chest pain/ACS    (apixaban PTA)  No Known Allergies  Patient Measurements: Height: 5\' 8"  (172.7 cm) Weight: 66.3 kg (146 lb 2.6 oz) IBW/kg (Calculated) : 68.4 Heparin Dosing Weight: 66.3 kg  Vital Signs: Temp: 98 F (36.7 C) (03/23 0107) Temp Source: Oral (03/23 0107) BP: 90/49 (03/23 0107) Pulse Rate: 50 (03/23 0107)  Labs: Recent Labs    06/01/23 1631 06/01/23 1809 06/02/23 0335 06/02/23 0533 06/02/23 0902 06/02/23 1550 06/03/23 0112  HGB 10.5*  --  8.5*  --   --   --  8.2*  HCT 33.7*  --  27.9*  --   --   --  26.8*  PLT 376  --  291  --   --   --  295  APTT 39*  --  >200* 55*  --  65* 67*  LABPROT 21.1*  --   --   --   --   --   --   INR 1.8*  --   --   --   --   --   --   HEPARINUNFRC >1.10*  --  >1.10* >1.10*  --   --  >1.10*  CREATININE 0.73  --  0.79  --   --   --  0.78  TROPONINIHS 26* 146*  --   --  239*  --   --     Estimated Creatinine Clearance: 72.5 mL/min (by C-G formula based on SCr of 0.78 mg/dL).  Medical History: Past Medical History:  Diagnosis Date   Essential hypertension    HFrEF (heart failure with reduced ejection fraction) (HCC)    Mitral regurgitation    Assessment: 78 y/o male presenting after mechanical fall. Patient has been boarding in ED pending placement, however, this evening he became altered from baseline and was found to have elevated troponins. 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 Pharmacy has been consulted to transition to heparin infusion.  Last dose of apixaban given: 5 mg @ 0955 on 3/21  Baseline labs: hgb 10.5, plt 376, INR pending, HL > 1.10, aPTT 39  3/22 0335 aPTT >200 / HL >1.10   lab recheck was ordered 3/22 0533 aPTT 55 / HL >1.10      aPTT subtherapeutic 3/22 1550 aPTT 65         aPTT  subtherapeutic 3/23 0112 aPTT 67, therapeutic / HL > 1.1 not correlating  Goal of Therapy:  Heparin level 0.3-0.7 units/ml aPTT 66-102 seconds Monitor platelets by anticoagulation protocol: Yes   Plan:  Continue heparin infusion at 1050 units/hour  Recheck 8 hour aPTT level after rate change Hgb 10.5 > 8.5 > 8.2 monitor Monitor aPTT and HL levels daily until correlating, then monitor HL levels only Monitor CBC and signs/symptoms of bleeding  Thank you for involving pharmacy in this patient's care.   Otelia Sergeant, PharmD, [facility] 06/03/2023 2:44 AM

## 2023-06-03 NOTE — Consult Note (Signed)
 WOC Nurse Consult Note: Reason for Consult: pressure injuries, LE scabs, skin tears Wound type: See nursing flow sheets; Stage 2 pressure injury; coccyx; Stage 1 pressure injury; heel (per consult) Skin tear; left wrist and right elbow (POA).  Scabs over the LEs bilaterally. Noted also in images at the time of admission from reported falling in the home Pressure Injury POA: Yes Measurement: see nursing flow sheets Wound bed:see nursing flow sheets Drainage (amount, consistency, odor) see nursing flow sheets Periwound:intact  Dressing procedure/placement/frequency: Cleanse skin tears and buttock pressure injury with saline, pat dry Cover buttock wound with foam, change every three days and PRN  Cover left heel with foam, change every 3 days Prevalon boots bilaterally to offload heels Skin care order set for skin tears, single layer of xeroform, foam, change every other day Chair pressure redistribution pad if up in the chair.  Leave LE wounds open to air.    Re consult if needed, will not follow at this time. Thanks  Mariela Rex [employer], RN,CWOCN, CNS, CWON-AP [phone])

## 2023-06-03 NOTE — Progress Notes (Addendum)
 Progress Note   Patient: Walter Vargas ZOX:[id number] DOB: 01-Jul-1945 DOA: 05/24/2023     2 DOS: the patient was seen and examined on 06/03/2023   Brief hospital course: HPI on admission 06/01/23: "Louay Myrie is a 78 year old male with history of GERD, hyperlipidemia, CAD status post CABG, on Eliquis and amiodarone, carotid enterectomy, who presents emergency department for chief concerns of falling.   Patient was pending discharge to skilled nursing facility in the ED since 05/24/2023.   On day of admission, patient was noted to have hypoxic respite failure, desatting to 80% on room air.  Patient was placed on 6 L nasal cannula with improvement to 94%.  Patient has been titrated down to 3 L nasal cannula at this time with SpO2 100% on room air.  And during hypoxic episode, patient was noted to be altered and trying to pull out leads and oxygen supplementation.  Patient is currently more redirectable.   Vitals in the ED showed temperature of 98.9, respiration rate of 26, heart rate 94, blood pressure 125/70, SpO2 of 80% on room air, currently improved to 1 high percent on 3 L nasal cannula...." See H&P for full HPI on admission & ED course.  Patient was admitted for further evaluation and management of severe sepsis due  multifocal pneumonia complicated by acute respiratory failure with hypoxia.  Further hospital course and management as outlined below.    Assessment and Plan:   Severe sepsis due to Multifocal Pneumonia, suspect hospital-acquired Pt was boarding in the ED awaiting discharge to SNF for 7 days prior to admission. Severe sepsis evidenced by tachypnea, leukocytosis, lactic acidosis with pulmonary and cardiac organ involvement in setting of acute hypoxic respiratory failure --Continue Vancomymin, Cefepime & Doxycycline  --Changed Zithromax >> Doxycycline (to avoid prolong QT in pt on amiodarone) --Mucolytics, bronchodilators and other supportive care per orders --IS and  flutter --Follow cultures --Monitor fever curve, CBC, hemodynamics  Acute respiratory failure with hypoxia - due to PNA, pleural effusions, ?CHF --Mgmt of underlying issues as outlined --O2 per protocol, wean as tolerated, target sats > 90%  Chronic combined systolic and diastolic CHF  History of VT (peri-CABG surgery) Abnormal EKG Elevated Troponins likely Demand Ischemia in setting of sepsis and hypoxia Echo 04/20/2023: EF ~ 20--25%, grade II DD Given Lasix 40 mg IV x 1 on admission BNP elevated 3075 (note baseline BNP appears ~2200+) --Cardiology consulted  --Stopped digoxin due to bradycardia --Continue amiodarone, consider lowering dose to 100 mg for bradycardia, monitor HR off digoxin first --Lasix 20 mg PO daily. Aldactone 12.5 mg daily --Daily weights, strict Io's --1500 cc/day fluid restriction --Trend troponin to peak --Heparin drip x 36 hours -- stopped due to Hbg drop --Stat EKG and repeat troponin if chest pain --Maintain K>4, Mg>2 for hx of VT  Bilateral pleural effusions --Strict I's and O's --Will consider thoracentesis if needed  Normocytic anemia -- Hbg 10.5 on admission >> 8.5 >> 8.2 --Heparin drip has been stopped --Monitor CBC --Monitor for signs of bleeding --Transfuse RBC's if Hbg < 8 cardiac history  Hyperlipidemia --Lipitor  CAD - stable S/P CABG x 5 --Continue aspirin and Lipitor   Protein-calorie malnutrition, severe --Dietitian consulted        Subjective: Pt seen this AM, sleeping but woke to voice easily.  He reports feeling okay, no acute complaints.  He is confused as to what happened prior to this admission and asks "how did I get here?".    Denies chest pain or difficulty breathing,  fever or chills.    Physical Exam: Vitals:   06/03/23 0310 06/03/23 0517 06/03/23 0742 06/03/23 1133  BP: (!) 105/59  117/68 (!) 99/49  Pulse: (!) 51  (!) 52 (!) 51  Resp: 20  19 18   Temp: 98 F (36.7 C)  98.7 F (37.1 C) 97.8 F (36.6 C)   TempSrc: Oral     SpO2: 97%  100% 99%  Weight:  56.8 kg    Height:       General exam: sleeping, woke easily to voice, no acute distress, frail, chronically ill appearing HEENT: moist mucus membranes, hearing grossly normal  Respiratory system: lungs more clear today, improved rhonchi, no wheezes, normal respiratory effort at rest on room air Cardiovascular system: normal S1/S2, RRR, no JVD, no pedal edema.   Gastrointestinal system: soft, NT, ND Central nervous system: A&O x self and hospital. no gross focal neurologic deficits, normal speech Extremities: moves all, no edema, normal tonePsychiatry: normal mood, congruent affect   Data Reviewed:  Notable labs --   Na 134, BUN 25, Ca 7.8 otherwise normal BMP  Troponin trend: 26 >> 146 >> 239  Family Communication: None present, will attempt to call as time allows  Disposition: Status is: Inpatient Remains inpatient appropriate because: severity of illness as above, on IV therapies, ongoing evaluation   Planned Discharge Destination: Skilled nursing facility    Time spent: 45 minutes  Author: Pennie Banter, DO 06/03/2023 1:56 PM  For on call review [email or web address].

## 2023-06-03 NOTE — Progress Notes (Signed)
 PHARMACY - ANTICOAGULATION CONSULT NOTE  Pharmacy Consult for heparin Indication: chest pain/ACS    (apixaban PTA)  No Known Allergies  Patient Measurements: Height: 5\' 8"  (172.7 cm) Weight: 56.8 kg (125 lb 3.5 oz) IBW/kg (Calculated) : 68.4 Heparin Dosing Weight: 66.3 kg  Vital Signs: Temp: 98.7 F (37.1 C) (03/23 0742) Temp Source: Oral (03/23 0310) BP: 117/68 (03/23 0742) Pulse Rate: 52 (03/23 0742)  Labs: Recent Labs    06/01/23 1631 06/01/23 1809 06/02/23 0335 06/02/23 0533 06/02/23 0902 06/02/23 1550 06/03/23 0112 06/03/23 0927  HGB 10.5*  --  8.5*  --   --   --  8.2*  --   HCT 33.7*  --  27.9*  --   --   --  26.8*  --   PLT 376  --  291  --   --   --  295  --   APTT 39*  --  >200* 55*  --  65* 67* 64*  LABPROT 21.1*  --   --   --   --   --   --   --   INR 1.8*  --   --   --   --   --   --   --   HEPARINUNFRC >1.10*  --  >1.10* >1.10*  --   --  >1.10*  --   CREATININE 0.73  --  0.79  --   --   --  0.78  --   TROPONINIHS 26* 146*  --   --  239*  --   --   --     Estimated Creatinine Clearance: 62.1 mL/min (by C-G formula based on SCr of 0.78 mg/dL).  Medical History: Past Medical History:  Diagnosis Date   Essential hypertension    HFrEF (heart failure with reduced ejection fraction) (HCC)    Mitral regurgitation    Assessment: 78 y/o male presenting after mechanical fall. Patient has been boarding in ED pending placement, however, this evening he became altered from baseline and was found to have elevated troponins. 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 Pharmacy has been consulted to transition to heparin infusion.  Last dose of apixaban given: 5 mg @ 0955 on 3/21  Baseline labs: hgb 10.5, plt 376, INR pending, HL > 1.10, aPTT 39  3/22 0335 aPTT >200 / HL >1.10   lab recheck was ordered 3/22 0533 aPTT 55 / HL >1.10       aPTT subtherapeutic 3/22 1550 aPTT 65         aPTT subtherapeutic 3/23 0112 aPTT 67, therapeutic / HL > 1.1 not correlating 3/23 0927 aPTT 64, SUBtherapeutic  Goal of Therapy:  Heparin level 0.3-0.7 units/ml aPTT 66-102 seconds Monitor platelets by anticoagulation protocol: Yes   Plan:  Increase heparin infusion at 1150 units/hour  check confirmatory aPTT in 8 hour aPTT after rate change Hgb 10.5 > 8.5 > 8.2 monitor Monitor aPTT and HL levels daily until correlating, then monitor HL levels only Monitor CBC and signs/symptoms of bleeding  Thank you for involving pharmacy in this patient's care.   Bari Mantis PharmD Clinical Pharmacist 06/03/2023

## 2023-06-04 ENCOUNTER — Other Ambulatory Visit (HOSPITAL_COMMUNITY): Payer: Self-pay

## 2023-06-04 ENCOUNTER — Telehealth (HOSPITAL_COMMUNITY): Payer: Self-pay | Admitting: Pharmacy Technician

## 2023-06-04 ENCOUNTER — Inpatient Hospital Stay (HOSPITAL_COMMUNITY): Admit: 2023-06-04 | Discharge: 2023-06-04 | Disposition: A | Discharge status: Home / Self Care | Attending: Medical

## 2023-06-04 DIAGNOSIS — J189 Pneumonia, unspecified organism: Secondary | ICD-10-CM | POA: Diagnosis not present

## 2023-06-04 DIAGNOSIS — I255 Ischemic cardiomyopathy: Secondary | ICD-10-CM

## 2023-06-04 DIAGNOSIS — I5042 Chronic combined systolic (congestive) and diastolic (congestive) heart failure: Secondary | ICD-10-CM | POA: Diagnosis not present

## 2023-06-04 DIAGNOSIS — E43 Unspecified severe protein-calorie malnutrition: Secondary | ICD-10-CM

## 2023-06-04 DIAGNOSIS — R7989 Other specified abnormal findings of blood chemistry: Secondary | ICD-10-CM

## 2023-06-04 LAB — BASIC METABOLIC PANEL
Anion gap: 6 (ref 5–15)
BUN: 18 mg/dL (ref 8–23)
CO2: 23 mmol/L (ref 22–32)
Calcium: 8.1 mg/dL — ABNORMAL LOW (ref 8.9–10.3)
Chloride: 106 mmol/L (ref 98–111)
Creatinine, Ser: 0.71 mg/dL (ref 0.61–1.24)
GFR, Estimated: >60 mL/min (ref >60)
Glucose, Bld: 116 mg/dL — ABNORMAL HIGH (ref 70–99)
Potassium: 3.9 mmol/L (ref 3.5–5.1)
Sodium: 135 mmol/L (ref 135–145)

## 2023-06-04 LAB — ECHOCARDIOGRAM COMPLETE
AR max vel: 2.63 cm2
AV Area VTI: 2.98 cm2
AV Area mean vel: 2.29 cm2
AV Mean grad: 2 mmHg
AV Peak grad: 4 mmHg
Ao pk vel: 1 m/s
Area-P 1/2: 3.95 cm2
Calc EF: 38.2 %
Height: 68 in
S' Lateral: 4.8 cm
Single Plane A2C EF: 35.3 %
Single Plane A4C EF: 43.2 %
Weight: 2338.64 [oz_av]

## 2023-06-04 LAB — CBC
HCT: 30.1 % — ABNORMAL LOW (ref 39.0–52.0)
Hemoglobin: 9.3 g/dL — ABNORMAL LOW (ref 13.0–17.0)
MCH: 27.8 pg (ref 26.0–34.0)
MCHC: 30.9 g/dL (ref 30.0–36.0)
MCV: 90.1 fL (ref 80.0–100.0)
Platelets: 322 10*3/uL (ref 150–400)
RBC: 3.34 MIL/uL — ABNORMAL LOW (ref 4.22–5.81)
RDW: 19 % — ABNORMAL HIGH (ref 11.5–15.5)
WBC: 6.1 10*3/uL (ref 4.0–10.5)
nRBC: 0 % (ref 0.0–0.2)

## 2023-06-04 MED ORDER — ADULT MULTIVITAMIN W/MINERALS CH
1.0000 | ORAL_TABLET | Freq: Every day | ORAL | Status: DC
  Administered 2023-06-04 – 2023-06-22 (×18): 1 via ORAL
  Filled 2023-06-04 (×19): qty 1

## 2023-06-04 MED ORDER — ENSURE ENLIVE PO LIQD
237.0000 mL | Freq: Two times a day (BID) | ORAL | Status: DC
Start: 2023-06-04 — End: 2023-06-22
  Administered 2023-06-04 – 2023-06-22 (×21): 237 mL via ORAL

## 2023-06-04 MED ORDER — VITAMIN C 500 MG PO TABS
500.0000 mg | ORAL_TABLET | Freq: Two times a day (BID) | ORAL | Status: DC
  Administered 2023-06-04 – 2023-06-22 (×36): 500 mg via ORAL
  Filled 2023-06-04 (×36): qty 1

## 2023-06-04 MED ORDER — DAPAGLIFLOZIN PROPANEDIOL 10 MG PO TABS
10.0000 mg | ORAL_TABLET | Freq: Every day | ORAL | Status: DC
  Administered 2023-06-04 – 2023-06-22 (×19): 10 mg via ORAL
  Filled 2023-06-04 (×19): qty 1

## 2023-06-04 MED ORDER — AMOXICILLIN-POT CLAVULANATE 875-125 MG PO TABS
1.0000 | ORAL_TABLET | Freq: Two times a day (BID) | ORAL | Status: AC
  Administered 2023-06-04 – 2023-06-07 (×8): 1 via ORAL
  Filled 2023-06-04 (×8): qty 1

## 2023-06-04 MED ORDER — ZINC SULFATE 220 (50 ZN) MG PO CAPS
220.0000 mg | ORAL_CAPSULE | Freq: Every day | ORAL | Status: AC
  Administered 2023-06-04 – 2023-06-17 (×14): 220 mg via ORAL
  Filled 2023-06-04 (×14): qty 1

## 2023-06-04 MED ORDER — DOXYCYCLINE HYCLATE 100 MG PO TABS
100.0000 mg | ORAL_TABLET | Freq: Two times a day (BID) | ORAL | Status: AC
  Administered 2023-06-04 – 2023-06-07 (×8): 100 mg via ORAL
  Filled 2023-06-04 (×8): qty 1

## 2023-06-04 NOTE — Telephone Encounter (Signed)
 Patient Product/process development scientist completed.    The patient is insured through [facility]. Patient has Medicare and is not eligible for a copay card, but may be able to apply for patient assistance or Medicare RX Payment Plan (Patient Must reach out to their plan, if eligible for payment plan), if available.    Ran test claim for Entresto 24-26 mg and the current 30 day co-pay is $0.00.  Ran test claim for Farxiga 10 mg and the current 30 day co-pay is $0.00.  Ran test claim for Jardiance 10 mg and the current 30 day co-pay is $0.00.    This test claim was processed through [facility]- copay amounts may vary at other pharmacies due to pharmacy/plan contracts, or as the patient moves through the different stages of their insurance plan.     Roland Earl, CPHT Pharmacy Technician III Certified Patient Advocate [facility] Patient Advocate Team Direct Number: [phone]  Fax: [phone]

## 2023-06-04 NOTE — Progress Notes (Signed)
 Initial Nutrition Assessment  DOCUMENTATION CODES:   Severe malnutrition in context of chronic illness  INTERVENTION:   -MVI with minerals daily -500 mg vitamin C BID -220 mg zinc sulfate daily x 14 days -Ensure Enlive po BID, each supplement provides 350 kcal and 20 grams of protein.  -Magic cup BID with meals, each supplement provides 290 kcal and 9 grams of protein  -Liberalize diet to 2 gram sodium for wider variety of meal selections; continue with 1.5 L fluid restriction per conversation with MD  NUTRITION DIAGNOSIS:   Severe Malnutrition related to chronic illness (CAD, CHF) as evidenced by moderate fat depletion, severe fat depletion, percent weight loss, moderate muscle depletion, severe muscle depletion.  GOAL:   Patient will meet greater than or equal to 90% of their needs  MONITOR:   PO intake, Supplement acceptance  REASON FOR ASSESSMENT:   Consult Assessment of nutrition requirement/status  ASSESSMENT:   Pt with history of GERD, hyperlipidemia, CAD status post CABG, on Eliquis and amiodarone, carotid enterectomy, who presents for chief concerns of falling.  Pt admitted with severe sepsis and hospital acquired PNA.   Reviewed I/O's: -960 ml x 24 hours and -2.3 L since admission  UOP: 1.3 L x 24 hours  Per CWOCN note on 06/03/23, pt with stage 2 pressure injury to coccyx and stage 1 pressure injury to heel.   Pt sleeping soundly at time of visit. He did not awaken to voice or touch. Noted pt just finished working with acute rehab team prior to visit.   Pt currently on a heart healthy diet with 1.5 L fluid restriction. Pt with good oral intake. Noted meal completions 75-100%.   Reviewed wt hx; pt has experienced a 7.8% wt los over the past 3 months, which is significant for time frame.   Case discussed with hospitalist and cardiology; discussed concerns over increased nutritional needs secondary to wound healing and malnutrition. Recommended possibility of  liberalizing diet and fluid restriction to increase wider variety of meal selections and accommodate supplements. Cardiology and MD would like to continue fluid restriction as is, due to history of CHF, volume overload, and elevated BNP levels.   Medications reviewed and include lasix, miralax, senokot, and aldactone.   Per MD notes, plan SNF placement at discharge.   Labs reviewed: CBGS: 71 (inpatient orders for glycemic control are none).    NUTRITION - FOCUSED PHYSICAL EXAM:  Flowsheet Row Most Recent Value  Orbital Region Severe depletion  Upper Arm Region Severe depletion  Thoracic and Lumbar Region Moderate depletion  Buccal Region Severe depletion  Temple Region Severe depletion  Clavicle Bone Region Severe depletion  Clavicle and Acromion Bone Region Severe depletion  Scapular Bone Region Severe depletion  Dorsal Hand Severe depletion  Patellar Region Moderate depletion  Anterior Thigh Region Moderate depletion  Posterior Calf Region Moderate depletion  Edema (RD Assessment) Mild  Hair Reviewed  Eyes Reviewed  Mouth Reviewed  Skin Reviewed  Nails Reviewed       Diet Order:   Diet Order             Diet 2 gram sodium Fluid consistency: Thin  Diet effective now                   EDUCATION NEEDS:   Education needs have been addressed  Skin:  Skin Assessment: Skin Integrity Issues: Skin Integrity Issues:: Other (Comment), Stage II, Stage I Stage I: heel Stage II: coccyx Other: skin tears to lt arm and  thigh  Last BM:  05/31/23 (type 6)  Height:   Ht Readings from Last 1 Encounters:  06/01/23 5\' 8"  (1.727 m)    Weight:   Wt Readings from Last 1 Encounters:  06/04/23 57.9 kg    Ideal Body Weight:  70 kg  BMI:  Body mass index is 19.41 kg/m.  Estimated Nutritional Needs:   Kcal:  1800-2000  Protein:  100-115 grams  Fluid:  1.5 L    Levada Schilling, RD, LDN, CDCES Registered Dietitian III Certified Diabetes Care and Education  Specialist If unable to reach this RD, please use "RD Inpatient" group chat on secure chat between hours of 8am-4 pm daily

## 2023-06-04 NOTE — Progress Notes (Signed)
 Progress Note   Patient: Walter Vargas:[id number] DOB: Mar 23, 1945 DOA: 05/24/2023     3 DOS: the patient was seen and examined on 06/04/2023   Brief hospital course: HPI on admission 06/01/23: "Walter Vargas is a 78 year old male with history of GERD, hyperlipidemia, CAD status post CABG, on Eliquis and amiodarone, carotid enterectomy, who presents emergency department for chief concerns of falling.   Patient was pending discharge to skilled nursing facility in the ED since 05/24/2023.   On day of admission, patient was noted to have hypoxic respite failure, desatting to 80% on room air.  Patient was placed on 6 L nasal cannula with improvement to 94%.  Patient has been titrated down to 3 L nasal cannula at this time with SpO2 100% on room air.  And during hypoxic episode, patient was noted to be altered and trying to pull out leads and oxygen supplementation.  Patient is currently more redirectable.   Vitals in the ED showed temperature of 98.9, respiration rate of 26, heart rate 94, blood pressure 125/70, SpO2 of 80% on room air, currently improved to 1 high percent on 3 L nasal cannula...." See H&P for full HPI on admission & ED course.  Patient was admitted for further evaluation and management of severe sepsis due  multifocal pneumonia complicated by acute respiratory failure with hypoxia.  Further hospital course and management as outlined below.    Assessment and Plan:   Severe sepsis due to Multifocal Pneumonia, suspect hospital-acquired Pt was boarding in the ED awaiting discharge to SNF for 7 days prior to admission. Severe sepsis evidenced by tachypnea, leukocytosis, lactic acidosis with pulmonary and cardiac organ involvement in setting of acute hypoxic respiratory failure --Treated with IV Vancomymin, Cefepime & Doxycycline  --Transition to PO antibiotics -- Doxycycline and Augmentin --Mucolytics, bronchodilators and other supportive care per orders --IS and  flutter --Follow cultures --Monitor fever curve, CBC, hemodynamics  Acute respiratory failure with hypoxia - due to PNA, pleural effusions, ?CHF --Mgmt of underlying issues as outlined --O2 per protocol, wean as tolerated, target sats > 90%  Chronic combined systolic and diastolic CHF  History of VT (peri-CABG surgery) Abnormal EKG Elevated Troponins likely Demand Ischemia in setting of sepsis and hypoxia Echo 04/20/2023: EF ~ 20--25%, grade II DD Given Lasix 40 mg IV x 1 on admission BNP elevated 3075 (note baseline BNP appears ~2200+) --Cardiology consulted  --Stopped digoxin due to bradycardia --Continue amiodarone, consider lowering dose to 100 mg for bradycardia, monitor HR off digoxin first --Lasix 20 mg PO daily. Aldactone 12.5 mg daily --Daily weights, strict Io's --1500 cc/day fluid restriction --Trend troponin to peak --Heparin drip x 36 hours -- stopped due to Hbg drop --Stat EKG and repeat troponin if chest pain --Maintain K>4, Mg>2 for hx of VT  Bilateral pleural effusions --Strict I's and O's --Will consider thoracentesis if needed  Normocytic anemia -- Hbg 10.5 on admission >> 8.5 >> 8.2 --Heparin drip has been stopped --Monitor CBC --Monitor for signs of bleeding --Transfuse RBC's if Hbg < 8 cardiac history  Hyperlipidemia --Lipitor  CAD - stable S/P CABG x 5 --Continue aspirin and Lipitor   Protein-calorie malnutrition, severe --Dietitian consulted        Subjective: Pt seen this AM, awake resting in bed after getting cleaned up. He denies complaints including trouble bleeding or chest pain, fever or chills. Denies cough.    Physical Exam: Vitals:   06/03/23 2324 06/04/23 0433 06/04/23 0754 06/04/23 1157  BP: (!) 110/58 113/60 [name])  114/54 135/75  Pulse: (!) 54 (!) 52 (!) 53 64  Resp: 18 18    Temp: 97.6 F (36.4 C) 98.9 F (37.2 C) (!) 96.6 F (35.9 C) (!) 97.5 F (36.4 C)  TempSrc: Oral Oral Oral   SpO2: 98% 96% 98% 98%  Weight:  57.9  kg    Height:       General exam: sleeping, woke easily to voice, no acute distress, frail, chronically ill appearing HEENT: moist mucus membranes, hearing grossly normal  Respiratory system: CTAB, no wheezes, normal respiratory effort at rest on room air Cardiovascular system: normal S1/S2, RRR, no JVD, no pedal edema.   Gastrointestinal system: soft, NT, ND Central nervous system: A&O x self and hospital. no gross focal neurologic deficits, normal speech Extremities: moves all, no edema, normal tone Psychiatry: normal mood, congruent affect   Data Reviewed:  Notable labs --   Na 134, BUN 25, Ca 7.8 otherwise normal BMP  ECHO -- EF 20-25%, no LV WMA's, mild LVH, mild-mod MR  Family Communication: None present, will attempt to call as time allows  Disposition: Status is: Inpatient Remains inpatient appropriate because: severity of illness as above, on IV therapies, ongoing evaluation   Planned Discharge Destination: Skilled nursing facility    Time spent: 38 minutes  Author: Pennie Banter, DO 06/04/2023 1:57 PM  For on call review [email or web address].

## 2023-06-04 NOTE — Plan of Care (Signed)

## 2023-06-04 NOTE — Progress Notes (Signed)
 Rounding Note    Patient Name: Walter Vargas Date of Encounter: 06/04/2023  [facility] Cardiologist: Debbe Odea, MD   Subjective   Patient reports he feels better today. He endorses ongoing shortness of breath. However, not requiring supplemental O2 at this time. He denies chest pain. One episode of NSVT 10 beats noted on telemetry overnight. GDMT limited by hypotension and bradycardia at this time.   Inpatient Medications    Scheduled Meds:  amiodarone  200 mg Oral Daily   ascorbic acid  500 mg Oral Daily   aspirin  81 mg Oral Daily   atorvastatin  80 mg Oral Daily   dextromethorphan-guaiFENesin  1 tablet Oral BID   furosemide  20 mg Oral Daily   pantoprazole  40 mg Oral BID AC   polyethylene glycol  17 g Oral Daily   senna-docusate  1 tablet Oral BID   spironolactone  12.5 mg Oral Daily   Continuous Infusions:  ceFEPime (MAXIPIME) IV 2 g (06/04/23 0124)   doxycycline (VIBRAMYCIN) IV 100 mg (06/04/23 0515)   vancomycin 750 mg (06/03/23 2020)   PRN Meds: acetaminophen **OR** acetaminophen, hydrOXYzine, ondansetron **OR** ondansetron (ZOFRAN) IV   Vital Signs    Vitals:   06/03/23 1548 06/03/23 1927 06/03/23 2324 06/04/23 0433  BP: (!) 105/55 (!) 115/54 (!) 110/58 113/60  Pulse: (!) 54 (!) 55 (!) 54 (!) 52  Resp: 17 19 18 18   Temp: 98.7 F (37.1 C) 98.1 F (36.7 C) 97.6 F (36.4 C) 98.9 F (37.2 C)  TempSrc:  Oral Oral Oral  SpO2: 99% 99% 98% 96%  Weight:    57.9 kg  Height:        Intake/Output Summary (Last 24 hours) at 06/04/2023 0753 Last data filed at 06/04/2023 0443 Gross per 24 hour  Intake 340 ml  Output 1300 ml  Net -960 ml      06/04/2023    4:33 AM 06/03/2023    5:17 AM 06/01/2023    7:04 PM  Last 3 Weights  Weight (lbs) 127 lb 10.3 oz 125 lb 3.5 oz 146 lb 2.6 oz  Weight (kg) 57.9 kg 56.8 kg 66.3 kg      Telemetry    Sinus rhythm rate 50-55 bpm with one episode of NSVT 10 beats - Personally Reviewed  Physical Exam    GEN: Frail appearing. No acute distress.   Neck: No JVD Cardiac: RRR, no murmurs, rubs, or gallops.  Respiratory: Lung sounds diminished at the bases GI: Soft, nontender, non-distended  MS: No edema; No deformity. Boots in place.  Neuro:  Nonfocal  Psych: Normal affect   Labs    High Sensitivity Troponin:   Recent Labs  Lab 05/24/23 2104 05/24/23 2321 06/01/23 1631 06/01/23 1809 06/02/23 0902  TROPONINIHS 30* 33* 26* 146* 239*     Chemistry Recent Labs  Lab 06/01/23 1631 06/02/23 0141 06/02/23 0335 06/03/23 0112 06/04/23 0719  NA 136  --  134* 134* 135  K 5.0  --  4.1 3.5 3.9  CL 105  --  103 106 106  CO2 22  --  26 23 23   GLUCOSE 120*  --  89 87 116*  BUN 22  --  23 25* 18  CREATININE 0.73  --  0.79 0.78 0.71  CALCIUM 8.5*  --  8.3* 7.8* 8.1*  MG  --  1.9  --  1.9  --   PROT 6.9  --   --   --   --  ALBUMIN 3.1*  --   --   --   --   AST 23  --   --   --   --   ALT 22  --   --   --   --   ALKPHOS 78  --   --   --   --   BILITOT 1.1  --   --   --   --   GFRNONAA >60  --  >60 >60 >60  ANIONGAP 9  --  5 5 6     Lipids No results for input(s): "CHOL", "TRIG", "HDL", "LABVLDL", "LDLCALC", "CHOLHDL" in the last 168 hours.  Hematology Recent Labs  Lab 06/02/23 0335 06/03/23 0112 06/04/23 0719  WBC 8.5 5.1 6.1  RBC 3.03* 2.96* 3.34*  HGB 8.5* 8.2* 9.3*  HCT 27.9* 26.8* 30.1*  MCV 92.1 90.5 90.1  MCH 28.1 27.7 27.8  MCHC 30.5 30.6 30.9  RDW 19.3* 19.3* 19.0*  PLT 291 295 322   Thyroid No results for input(s): "TSH", "FREET4" in the last 168 hours.  BNP Recent Labs  Lab 06/01/23 1631  BNP 3,074.9*    DDimer No results for input(s): "DDIMER" in the last 168 hours.   Radiology    No results found.  Cardiac Studies   Echo 04/18/23 1. Left ventricular ejection fraction, by estimation, is 20 to 25%. The  left ventricle has severely decreased function. The left ventricle  demonstrates global hypokinesis with septal-lateral dyssynchrony  consistent  with IVCD. The left ventricular  internal cavity size was mildly dilated. Left ventricular diastolic  parameters are consistent with Grade II diastolic dysfunction  (pseudonormalization).   2. Right ventricular systolic function is mildly reduced. The right  ventricular size is normal. Tricuspid regurgitation signal is inadequate  for assessing PA pressure.   3. Left atrial size was moderately dilated.   4. Right atrial size was mildly dilated.   5. The mitral valve is normal in structure. Mild mitral valve  regurgitation. No evidence of mitral stenosis.   6. The aortic valve is tricuspid. Aortic valve regurgitation is not  visualized. No aortic stenosis is present.   7. The inferior vena cava is normal in size with greater than 50%  respiratory variability, suggesting right atrial pressure of 3 mmHg.    Intra-op TEE 02/2023  Left Ventricle: The left ventricle 20. The cavity size was severely  dilated. There is no increase in left ventricular wall thickness. Left  ventricular diffuse hypokinesis.  Right Ventricle: The right ventricle has moderately reduced systolic  function. The cavity was dialated. There is no increase in right  ventricular wall thickness.  Left Atrium: Left atrial size was not assessed. No left atrial/left atrial  appendage thrombus was detected.  Right Atrium: Right atrial size was not assessed.  Interatrial Septum: No atrial level shunt detected by color flow Doppler.  Pericardium: There is no evidence of pericardial effusion.  Mitral Valve: The mitral valve is normal in structure. Mitral valve  regurgitation is trivial by color flow Doppler. There is No evidence of  mitral stenosis.  Tricuspid Valve: The tricuspid valve was normal in structure. Tricuspid  valve regurgitation is trivial by color flow Doppler.  Aortic Valve: The aortic valve is normal in structure. Aortic valve  regurgitation was not visualized by color flow Doppler. There is no  stenosis of  the aortic valve.  Pulmonic Valve: The pulmonic valve was normal in structure.  Pulmonic valve regurgitation is trivial by color flow Doppler.  R/L heart cath 02/2023 Conclusions: Severe three-vessel coronary artery disease, as detailed below. Normal left and right heart filling pressures. Mild-moderately reduced Fick cardiac output/index.  Patient Profile     Walter Vargas is a 78 y.o. 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   Assessment & Plan    Elevated troponin CAD s/p CABG x5 03/01/2024 - 01/2023 presented with NSTEMi found to have diffuse CAD and underwent CABG x5 requiring impella placement. Presented again 3/13 for fall and was awaiting placement to SNF, found to have severe sepsis, possible PNA, elevated troponin and BNP - Troponin peaked at 239, started on IV heparin - Heparin x36 hours, stopped secondary to hgb drop - Denies chest pain - Suspect demand ischemia in the setting of sepsis and PNA - Echo ordered, further recommendations pending results - Continue ASA, Plavix, and Lipitor  HFrEF - Pre-CABG echo<20% moderately reduced RV. Intra op TEE showed EF20%. - POCUS 12/25 showed LVEF 25-30% improved RV - Echo 2/7 LVEF 20-25%, global HK, G2DD, mildly reduced RV, mild MR - Discharged to CIR on on lasix 20mg  daily, Farxiga 10mg  daily, spiro 12.5mg  daily, losartan 12.5mg  daily however appears PTA he was only on digoxin. Notes suggest during CIR GDMT not tolerated d/t orthostasis/hypotension - Digoxin stopped yesterday secondary to bradycardia, dig level 0.6 - Continue spironolactone 12.5 mg daily and lasix 20 mg daily (restarted yesterday) - Kidney  function stable - Continue to monitor kidney function, strict I/Os, and daily weights - Appears euvolemic on exam - Echo ordered - Further escalation of GDMT limited by hypotension, consider SGLT2i if BP improves  Severe sepsis PNA - Antibiotics per IM  Carotid artery stenosis s/p L endarterectomy - Performed 04/25/23 by [facility]  Hx VT per CABG - One run of NSVT 10 beats overnight - Continue amiodarone 200 mg daily  Hx CVA - ASA and plavix   For questions or updates, please contact [facility] Please consult [email or web address] for contact info under        Signed, Orion Crook, PA-C  06/04/2023, 7:53 AM

## 2023-06-04 NOTE — Consult Note (Signed)
 [facility] Liaison Note  06/04/2023  Walter Vargas [birth year] [id number]  Location: RN Hospital Liaison met patient at bedside at [facility].  Insurance: [employer]   [name] is a 78 y.o. male who is a Primary Care Patient of Cable, Genene Churn, NP [facility] at [facility]. The patient was screened for 30 day readmission hospitalization with noted high risk score for unplanned readmission risk with 1 IP in 6 months.  The patient was assessed for potential Care Management service needs for post hospital transition for care coordination. Review of patient's electronic medical record reveals patient was admitted for Pneumonia. Pt recommended for SNF level of care. Currently pending placement with assistance from [facility] Patrol/TOC.   Liaison visited bedside with no family present to introduced VBCI services once he returns to the community. Pt able to verify his primary provider as noted above. Pt alert and oriented to name, place and time but not to the situation. Unable to reach the son for further introduction on VBCI services. If pt is discharged to an affiliated facility will make a referral to PAC-RN to follow. If pt is discharged to a non-affiliated facility the facility will continue to address pt's needs.  Plan: [facility] Liaison will continue to follow progress and disposition to asess for post hospital community care coordination/management needs.  Referral request for community care coordination: pending disposition.   VBCI Care Management/Population Health does not replace or interfere with any arrangements made by the Inpatient Transition of Care team.   For questions contact:   Elliot Cousin, RN, BSN Hospital Liaison [place]   [facility], Population Health Office Hours MTWF  8:00 am-6:00 pm Direct Dial: [phone] mobile [email or web address]

## 2023-06-04 NOTE — Care Management Important Message (Signed)
 Important Message  Patient Details  Name: Walter Vargas MRN: [id number] Date of Birth: 05-07-1945   Important Message Given:  Yes - Medicare IM     Cristela Blue, CMA 06/04/2023, 12:52 PM

## 2023-06-05 DIAGNOSIS — J189 Pneumonia, unspecified organism: Secondary | ICD-10-CM | POA: Diagnosis not present

## 2023-06-05 DIAGNOSIS — E43 Unspecified severe protein-calorie malnutrition: Secondary | ICD-10-CM | POA: Diagnosis not present

## 2023-06-05 DIAGNOSIS — I5042 Chronic combined systolic (congestive) and diastolic (congestive) heart failure: Secondary | ICD-10-CM | POA: Diagnosis not present

## 2023-06-05 MED ORDER — LOSARTAN POTASSIUM 25 MG PO TABS
25.0000 mg | ORAL_TABLET | Freq: Every day | ORAL | Status: DC
  Administered 2023-06-05 – 2023-06-22 (×18): 25 mg via ORAL
  Filled 2023-06-05 (×17): qty 1

## 2023-06-05 NOTE — TOC Progression Note (Addendum)
 Transition of Care [facility]) - Progression Note    Patient Details  Name: PARSA RICKETT MRN: [id number] Date of Birth: 1946/02/10  [facility] CM/SW Contact  Margarito Liner, LCSW Phone Number: 06/05/2023, 3:36 PM  Clinical Narrative:   CSW left voicemail for son to confirm they can private pay copay cost for two weeks up front at [employer].  4:04 pm: CSW spoke to Theodis Shove at [facility] DSS. They have an open APS case with patient.   Expected Discharge Plan: Skilled Nursing Facility Barriers to Discharge: Continued Medical Work up  Expected Discharge Plan and Services       Living arrangements for the past 2 months: Single Family Home                                       Social Determinants of Health (SDOH) Interventions SDOH Screenings   Food Insecurity: No Food Insecurity (06/01/2023)  Housing: Low Risk  (06/01/2023)  Transportation Needs: No Transportation Needs (06/01/2023)  Utilities: Not At Risk (06/01/2023)  Depression (PHQ2-9): Low Risk  (10/19/2022)  Social Connections: Socially Isolated (06/01/2023)  Tobacco Use: Medium Risk (06/01/2023)    Readmission Risk Interventions    03/20/2023   12:12 PM  Readmission Risk Prevention Plan  Transportation Screening Complete  Medication Review Oceanographer) Complete  HRI or Home Care Consult Complete  SW Recovery Care/Counseling Consult Complete  Palliative Care Screening Not Applicable  Skilled Nursing Facility Not Applicable

## 2023-06-05 NOTE — Plan of Care (Signed)

## 2023-06-05 NOTE — Plan of Care (Signed)

## 2023-06-05 NOTE — Progress Notes (Signed)
 Physical Therapy Treatment Patient Details Name: Walter Vargas MRN: [id number] DOB: September 30, 1945 Today's Date: 06/05/2023   History of Present Illness 78 year old male with history of GERD, hyperlipidemia, CAD status post CABG, on Eliquis and amiodarone, carotid enterectomy, who presented to emergency department after a f fall.  Patient was pending discharge to skilled nursing facility in the ED since 05/24/2023, but was noted to have hypoxic respite failure, desatting to 80% on room air.  Now admitted with pneumonia.    PT Comments  Pt was long sitting in recliner upon arrival. He is A but has cognition deficits. Is talkative and cooperative but lacks insight of situation. Pt was agreeable to ambulation. He remained on rm air throughout session with sao2 > 90%. No LOB or safety concerns with ambulation in hallway. Distances was limited by pt's slight SOB/fatigue. DC recs remain appropriate to maximize independnece and safety with all ADLs.    If plan is discharge home, recommend the following: A lot of help with walking and/or transfers;A lot of help with bathing/dressing/bathroom     Equipment Recommendations  Other (comment) (Defer to next level of care)       Precautions / Restrictions Precautions Precautions: Fall Recall of Precautions/Restrictions: Impaired Restrictions Weight Bearing Restrictions Per Provider Order: No RUE Weight Bearing Per Provider Order: Weight bearing as tolerated LUE Weight Bearing Per Provider Order: Weight bearing as tolerated     Mobility  Bed Mobility  General bed mobility comments: Pt was in recliner pre/post session    Transfers Overall transfer level: Needs assistance Equipment used: Rolling walker (2 wheels) Transfers: Sit to/from Stand Sit to Stand: Min assist  General transfer comment: Min assist to stand from recliner. Vcs for handplacement, fwd wt shift, and overall imporved sequencing. poor eccentric control with stand to sit     Ambulation/Gait Ambulation/Gait assistance: Contact guard assist Gait Distance (Feet): 70 Feet Assistive device: Rolling walker (2 wheels) Gait Pattern/deviations: Step-through pattern, Decreased stride length, Trunk flexed, Decreased step length - right, Decreased step length - left Gait velocity: Decreased  General Gait Details: Pt was easily able to ambulate 70 ft without LOB however heavy reliance on RW for all dynamic stand activity.    Balance Overall balance assessment: Needs assistance Sitting-balance support: Feet supported Sitting balance-Leahy Scale: Good     Standing balance support: Bilateral upper extremity supported, During functional activity Standing balance-Leahy Scale: Good       Communication Communication Communication: No apparent difficulties  Cognition Arousal: Alert Behavior During Therapy: WFL for tasks assessed/performed   PT - Cognitive impairments: No family/caregiver present to determine baseline   Orientation impairments: Time, Situation    PT - Cognition Comments: Pt is alert but overall confused/disoriented. He does follow commands consistently throughout but lacks insight of current situation. Following commands: Intact      Cueing Cueing Techniques: Verbal cues         Pertinent Vitals/Pain Pain Assessment Pain Assessment: No/denies pain     PT Goals (current goals can now be found in the care plan section) Acute Rehab PT Goals Patient Stated Goal: none stated Progress towards PT goals: Progressing toward goals    Frequency    Min 2X/week           Co-evaluation     PT goals addressed during session: Mobility/safety with mobility;Balance;Proper use of DME        AM-PAC PT "6 Clicks" Mobility   Outcome Measure  Help needed turning from your back to your side while  in a flat bed without using bedrails?: A Little Help needed moving from lying on your back to sitting on the side of a flat bed without using  bedrails?: A Little Help needed moving to and from a bed to a chair (including a wheelchair)?: A Little Help needed standing up from a chair using your arms (e.g., wheelchair or bedside chair)?: A Little Help needed to walk in hospital room?: A Little Help needed climbing 3-5 steps with a railing? : A Lot 6 Click Score: 17    End of Session Equipment Utilized During Treatment: Gait belt Activity Tolerance: Patient limited by fatigue Patient left: in chair;with call bell/phone within reach;with chair alarm set Nurse Communication: Mobility status PT Visit Diagnosis: Other abnormalities of gait and mobility (R26.89);Difficulty in walking, not elsewhere classified (R26.2);Muscle weakness (generalized) (M62.81);Unsteadiness on feet (R26.81);Repeated falls (R29.6)     Time: 1610-9604 PT Time Calculation (min) (ACUTE ONLY): 22 min  Charges:    $Gait Training: 8-22 mins PT General Charges $$ ACUTE PT VISIT: 1 Visit                    Jetta Lout PTA 06/05/23, 11:58 AM

## 2023-06-05 NOTE — Progress Notes (Signed)
 Progress Note   Patient: Walter Vargas NWG:[id number] DOB: [birth year] DOA: 05/24/2023     4 DOS: the patient was seen and examined on 06/05/2023   Brief hospital course: HPI on admission 06/01/23: "Walter Vargas is a 78 year old male with history of GERD, hyperlipidemia, CAD status post CABG, on Eliquis and amiodarone, carotid enterectomy, who presents emergency department for chief concerns of falling.   Patient was pending discharge to skilled nursing facility in the ED since 05/24/2023.   On day of admission, patient was noted to have hypoxic respite failure, desatting to 80% on room air.  Patient was placed on 6 L nasal cannula with improvement to 94%.  Patient has been titrated down to 3 L nasal cannula at this time with SpO2 100% on room air.  And during hypoxic episode, patient was noted to be altered and trying to pull out leads and oxygen supplementation.  Patient is currently more redirectable.   Vitals in the ED showed temperature of 98.9, respiration rate of 26, heart rate 94, blood pressure 125/70, SpO2 of 80% on room air, currently improved to 1 high percent on 3 L nasal cannula...." See H&P for full HPI on admission & ED course.  Patient was admitted for further evaluation and management of severe sepsis due  multifocal pneumonia complicated by acute respiratory failure with hypoxia.  Further hospital course and management as outlined below.    Assessment and Plan:   Severe sepsis due to Multifocal Pneumonia, suspect hospital-acquired Pt was boarding in the ED awaiting discharge to SNF for 7 days prior to admission. Severe sepsis evidenced by tachypnea, leukocytosis, lactic acidosis with pulmonary and cardiac organ involvement in setting of acute hypoxic respiratory failure --Treated with IV Vancomymin, Cefepime & Doxycycline  --Transition to PO antibiotics -- Doxycycline and Augmentin --Mucolytics, bronchodilators and other supportive care per orders --IS and  flutter --Follow cultures --Monitor fever curve, CBC, hemodynamics  Acute respiratory failure with hypoxia - due to PNA, pleural effusions, ?CHF --Mgmt of underlying issues as outlined --O2 per protocol, wean as tolerated, target sats > 90%  Chronic combined systolic and diastolic CHF  History of VT (peri-CABG surgery) Abnormal EKG Elevated Troponins likely Demand Ischemia in setting of sepsis and hypoxia Hx of CAD s/p CABGx5 (Dec 2024) Echo 04/20/2023: EF ~ 20--25%, grade II DD Given Lasix 40 mg IV x 1 on admission BNP elevated 3075 (note baseline BNP appears ~2200+) --Cardiology consulted  --Stopped digoxin due to bradycardia --Continue amiodarone 200 mg daily --Lasix 20 mg PO daily. Aldactone 12.5 mg daily, low dose losartan added --ASA, Plavix, Lipitor --Daily weights, strict Io's --1500 cc/day fluid restriction --Trend troponin to peak --Heparin drip x 36 hours -- stopped due to Hbg drop --Stat EKG and repeat troponin if chest pain --Maintain K>4, Mg>2 for hx of VT  Bilateral pleural effusions --Strict I's and O's --Will consider thoracentesis if needed  Normocytic anemia -- Hbg 10.5 on admission >> 8.5 >> 8.2 >> 9.3 --Heparin drip has been stopped --Monitor CBC --Monitor for signs of bleeding --Transfuse RBC's if Hbg < 8 cardiac history  Hyperlipidemia --Lipitor  CAD - stable S/P CABG x 5 --Continue aspirin and Lipitor   Protein-calorie malnutrition, severe --Dietitian consulted        Subjective: Pt up in recliner when seen this AM.  Reports feeling okay, denies acute complaints.    Physical Exam: Vitals:   06/05/23 0443 06/05/23 0500 06/05/23 0807 06/05/23 1213  BP: 114/61  118/65 105/60  Pulse: (!) 52  (!)  57 (!) 53  Resp: 18     Temp: 97.8 F (36.6 C)  97.9 F (36.6 C) 97.8 F (36.6 C)  TempSrc: Oral     SpO2: 93%  98% 100%  Weight:  57.8 kg    Height:       General exam: awake & alert, no acute distress, frail, chronically ill  appearing HEENT: moist mucus membranes, hearing grossly normal  Respiratory system: CTAB, no wheezes, normal respiratory effort at rest on room air Cardiovascular system: normal S1/S2, RRR, no JVD, no pedal edema.   Gastrointestinal system: soft, NT, ND Central nervous system: A&O x self and hospital. no gross focal neurologic deficits, normal speech Extremities: moves all, no edema, normal tone Psychiatry: normal mood, congruent affect   Data Reviewed:  No new labs today   Notable labs 3/24 --  Na 134, BUN 25, Ca 7.8 otherwise normal BMP Hbg 9.3 improved from 8.2  ECHO -- EF 20-25%, no LV WMA's, mild LVH, mild-mod MR  Family Communication: None present, will attempt to call as time allows  Disposition: Status is: Inpatient Remains inpatient appropriate because: severity of illness as above, on IV therapies, ongoing evaluation   Planned Discharge Destination: Skilled nursing facility    Time spent: 38 minutes  Author: Pennie Banter, DO 06/05/2023 12:42 PM  For on call review [email or web address].

## 2023-06-05 NOTE — Progress Notes (Signed)
 Rounding Note    Patient Name: Walter Vargas Date of Encounter: 06/05/2023  [facility] Cardiologist: Debbe Odea, MD   Subjective   No chest pain.  He reports improvement in shortness of breath.  Inpatient Medications    Scheduled Meds:  amiodarone  200 mg Oral Daily   amoxicillin-clavulanate  1 tablet Oral Q12H   ascorbic acid  500 mg Oral BID   aspirin  81 mg Oral Daily   dapagliflozin propanediol  10 mg Oral Daily   dextromethorphan-guaiFENesin  1 tablet Oral BID   doxycycline  100 mg Oral Q12H   feeding supplement  237 mL Oral BID BM   furosemide  20 mg Oral Daily   multivitamin with minerals  1 tablet Oral Daily   pantoprazole  40 mg Oral BID AC   polyethylene glycol  17 g Oral Daily   senna-docusate  1 tablet Oral BID   spironolactone  12.5 mg Oral Daily   zinc sulfate (50mg  elemental zinc)  220 mg Oral Daily   Continuous Infusions:   PRN Meds: acetaminophen **OR** acetaminophen, hydrOXYzine, ondansetron **OR** ondansetron (ZOFRAN) IV   Vital Signs    Vitals:   06/05/23 0021 06/05/23 0443 06/05/23 0500 06/05/23 0807  BP: 113/65 114/61  118/65  Pulse: (!) 56 (!) 52  (!) 57  Resp: 20 18    Temp: 97.7 F (36.5 C) 97.8 F (36.6 C)  97.9 F (36.6 C)  TempSrc: Oral Oral    SpO2: 100% 93%  98%  Weight:   57.8 kg   Height:        Intake/Output Summary (Last 24 hours) at 06/05/2023 0928 Last data filed at 06/04/2023 2105 Gross per 24 hour  Intake 340 ml  Output 925 ml  Net -585 ml      06/05/2023    5:00 AM 06/04/2023    4:33 AM 06/03/2023    5:17 AM  Last 3 Weights  Weight (lbs) 127 lb 6.8 oz 127 lb 10.3 oz 125 lb 3.5 oz  Weight (kg) 57.8 kg 57.9 kg 56.8 kg      Telemetry    Sinus rhythm with intermittent sinus bradycardia in the high 40s.   - Personally Reviewed  Physical Exam   GEN: Frail appearing. No acute distress.   Neck: No JVD Cardiac: RRR, no murmurs, rubs, or gallops.  Respiratory: Lung sounds diminished at the  bases GI: Soft, nontender, non-distended  MS: No edema; No deformity. Boots in place.  Neuro:  Nonfocal  Psych: Normal affect   Labs    High Sensitivity Troponin:   Recent Labs  Lab 05/24/23 2104 05/24/23 2321 06/01/23 1631 06/01/23 1809 06/02/23 0902  TROPONINIHS 30* 33* 26* 146* 239*     Chemistry Recent Labs  Lab 06/01/23 1631 06/02/23 0141 06/02/23 0335 06/03/23 0112 06/04/23 0719  NA 136  --  134* 134* 135  K 5.0  --  4.1 3.5 3.9  CL 105  --  103 106 106  CO2 22  --  26 23 23   GLUCOSE 120*  --  89 87 116*  BUN 22  --  23 25* 18  CREATININE 0.73  --  0.79 0.78 0.71  CALCIUM 8.5*  --  8.3* 7.8* 8.1*  MG  --  1.9  --  1.9  --   PROT 6.9  --   --   --   --   ALBUMIN 3.1*  --   --   --   --  AST 23  --   --   --   --   ALT 22  --   --   --   --   ALKPHOS 78  --   --   --   --   BILITOT 1.1  --   --   --   --   GFRNONAA >60  --  >60 >60 >60  ANIONGAP 9  --  5 5 6     Lipids No results for input(s): "CHOL", "TRIG", "HDL", "LABVLDL", "LDLCALC", "CHOLHDL" in the last 168 hours.  Hematology Recent Labs  Lab 06/02/23 0335 06/03/23 0112 06/04/23 0719  WBC 8.5 5.1 6.1  RBC 3.03* 2.96* 3.34*  HGB 8.5* 8.2* 9.3*  HCT 27.9* 26.8* 30.1*  MCV 92.1 90.5 90.1  MCH 28.1 27.7 27.8  MCHC 30.5 30.6 30.9  RDW 19.3* 19.3* 19.0*  PLT 291 295 322   Thyroid No results for input(s): "TSH", "FREET4" in the last 168 hours.  BNP Recent Labs  Lab 06/01/23 1631  BNP 3,074.9*    DDimer No results for input(s): "DDIMER" in the last 168 hours.   Radiology    ECHOCARDIOGRAM COMPLETE Result Date: 06/04/2023    ECHOCARDIOGRAM REPORT   Patient Name:   Walter Vargas Date of Exam: 06/04/2023 Medical Rec #:  [id number]     Height:       68.0 in Accession #:    [id number]    Weight:       127.6 lb Date of Birth:  Aug 11, 1945     BSA:          1.689 m Patient Age:    78 years      BP:           114/54 mmHg Patient Gender: M             HR:           53 bpm. Exam Location:  [facility] Procedure:  2D Echo, Cardiac Doppler, Color Doppler, Strain Analysis and 3D Echo            (Both Spectral and Color Flow Doppler were utilized during            procedure). Indications:     Elevated Troponin  History:         Patient has prior history of Echocardiogram examinations, most                  recent 04/20/2023. Risk Factors:Hypertension. Mitral                  regurgitation.  Sonographer:     Cristela Blue Referring Phys:  [id number] CADENCE H FURTH Diagnosing Phys: Lorine Bears MD  Sonographer Comments: Global longitudinal strain was attempted. IMPRESSIONS  1. Left ventricular ejection fraction, by estimation, is 20 to 25%. Left ventricular ejection fraction by 3D volume is 31 %. Left ventricular ejection fraction by PLAX is 21 %. The left ventricle has severely decreased function. The left ventricle has no regional wall motion abnormalities. The left ventricular internal cavity size was mildly dilated. There is mild left ventricular hypertrophy. Left ventricular diastolic parameters are indeterminate. The average left ventricular global longitudinal strain is -8.3 %. The global longitudinal strain is abnormal.  2. Right ventricular systolic function is normal. The right ventricular size is normal. There is mildly elevated pulmonary artery systolic pressure.  3. Left atrial size was moderately dilated.  4. Right atrial size was mildly dilated.  5. Moderate pleural  effusion in the left lateral region.  6. The mitral valve is normal in structure. Mild to moderate mitral valve regurgitation. No evidence of mitral stenosis.  7. The aortic valve is normal in structure. Aortic valve regurgitation is not visualized. No aortic stenosis is present.  8. The inferior vena cava is normal in size with <50% respiratory variability, suggesting right atrial pressure of 8 mmHg. FINDINGS  Left Ventricle: Left ventricular ejection fraction, by estimation, is 20 to 25%. Left ventricular ejection fraction by PLAX is 21 %. Left ventricular  ejection fraction by 3D volume is 31 %. The left ventricle has severely decreased function. The left ventricle has no regional wall motion abnormalities. The average left ventricular global longitudinal strain is -8.3 %. Strain was performed and the global longitudinal strain is abnormal. The left ventricular internal cavity size was mildly dilated. There is mild left ventricular hypertrophy. Left ventricular diastolic parameters are indeterminate. Right Ventricle: The right ventricular size is normal. No increase in right ventricular wall thickness. Right ventricular systolic function is normal. There is mildly elevated pulmonary artery systolic pressure. The tricuspid regurgitant velocity is 2.79  m/s, and with an assumed right atrial pressure of 8 mmHg, the estimated right ventricular systolic pressure is 39.1 mmHg. Left Atrium: Left atrial size was moderately dilated. Right Atrium: Right atrial size was mildly dilated. Pericardium: There is no evidence of pericardial effusion. Mitral Valve: The mitral valve is normal in structure. Mild to moderate mitral valve regurgitation. No evidence of mitral valve stenosis. Tricuspid Valve: The tricuspid valve is normal in structure. Tricuspid valve regurgitation is trivial. No evidence of tricuspid stenosis. Aortic Valve: The aortic valve is normal in structure. Aortic valve regurgitation is not visualized. No aortic stenosis is present. Aortic valve mean gradient measures 2.0 mmHg. Aortic valve peak gradient measures 4.0 mmHg. Aortic valve area, by VTI measures 2.98 cm. Pulmonic Valve: The pulmonic valve was normal in structure. Pulmonic valve regurgitation is not visualized. No evidence of pulmonic stenosis. Aorta: The aortic root is normal in size and structure. Venous: The inferior vena cava is normal in size with less than 50% respiratory variability, suggesting right atrial pressure of 8 mmHg. IAS/Shunts: No atrial level shunt detected by color flow Doppler.  Additional Comments: 3D was performed not requiring image post processing on an independent workstation and was abnormal. There is a moderate pleural effusion in the left lateral region.  LEFT VENTRICLE PLAX 2D LV EF:         Left            Diastology                ventricular     LV e' medial:    9.36 cm/s                ejection        LV E/e' medial:  9.0                fraction by     LV e' lateral:   7.72 cm/s                PLAX is 21      LV E/e' lateral: 10.9                %. LVIDd:         5.30 cm         2D Longitudinal LVIDs:         4.80 cm  Strain LV PW:         1.40 cm         2D Strain GLS   -8.3 % LV IVS:        1.10 cm         Avg: LVOT diam:     2.10 cm LV SV:         52              3D Volume EF LV SV Index:   31              LV 3D EF:    Left LVOT Area:     3.46 cm                     ventricul                                             ar                                             ejection LV Volumes (MOD)                            fraction LV vol d, MOD    241.0 ml                   by 3D A2C:                                        volume is LV vol d, MOD    192.0 ml                   31 %. A4C: LV vol s, MOD    156.0 ml A2C:                           3D Volume EF: LV vol s, MOD    109.0 ml      3D EF:        31 % A4C:                           LV EDV:       206 ml LV SV MOD A2C:   85.0 ml       LV ESV:       143 ml LV SV MOD A4C:   192.0 ml      LV SV:        63 ml LV SV MOD BP:    87.8 ml RIGHT VENTRICLE RV Basal diam:  4.10 cm RV Mid diam:    3.80 cm RV S prime:     20.20 cm/s TAPSE (M-mode): 1.5 cm LEFT ATRIUM             Index        RIGHT ATRIUM           Index LA diam:        5.00 cm 2.96 cm/m   RA Area:  19.30 cm LA Vol (A2C):   93.3 ml 55.25 ml/m  RA Volume:   60.00 ml  35.53 ml/m LA Vol (A4C):   56.0 ml 33.16 ml/m LA Biplane Vol: 79.9 ml 47.32 ml/m  AORTIC VALVE AV Area (Vmax):    2.63 cm AV Area (Vmean):   2.29 cm AV Area (VTI):     2.98 cm AV Vmax:            100.00 cm/s AV Vmean:          62.700 cm/s AV VTI:            0.173 m AV Peak Grad:      4.0 mmHg AV Mean Grad:      2.0 mmHg LVOT Vmax:         75.80 cm/s LVOT Vmean:        41.500 cm/s LVOT VTI:          0.149 m LVOT/AV VTI ratio: 0.86  AORTA Ao Root diam: 3.40 cm MITRAL VALVE               TRICUSPID VALVE MV Area (PHT): 3.95 cm    TR Peak grad:   31.1 mmHg MV Decel Time: 192 msec    TR Vmax:        279.00 cm/s MV E velocity: 84.40 cm/s                            SHUNTS                            Systemic VTI:  0.15 m                            Systemic Diam: 2.10 cm Lorine Bears MD Electronically signed by Lorine Bears MD Signature Date/Time: 06/04/2023/8:50:13 AM    Final     Cardiac Studies   Echocardiogram was done yesterday and was personally reviewed by me.  Showed an EF of 20 to 25% with mild to moderate mitral regurgitation  Echo 04/18/23 1. Left ventricular ejection fraction, by estimation, is 20 to 25%. The  left ventricle has severely decreased function. The left ventricle  demonstrates global hypokinesis with septal-lateral dyssynchrony  consistent with IVCD. The left ventricular  internal cavity size was mildly dilated. Left ventricular diastolic  parameters are consistent with Grade II diastolic dysfunction  (pseudonormalization).   2. Right ventricular systolic function is mildly reduced. The right  ventricular size is normal. Tricuspid regurgitation signal is inadequate  for assessing PA pressure.   3. Left atrial size was moderately dilated.   4. Right atrial size was mildly dilated.   5. The mitral valve is normal in structure. Mild mitral valve  regurgitation. No evidence of mitral stenosis.   6. The aortic valve is tricuspid. Aortic valve regurgitation is not  visualized. No aortic stenosis is present.   7. The inferior vena cava is normal in size with greater than 50%  respiratory variability, suggesting right atrial pressure of 3 mmHg.    R/L heart cath  02/2023 Conclusions: Severe three-vessel coronary artery disease, as detailed below. Normal left and right heart filling pressures. Mild-moderately reduced Fick cardiac output/index.  Patient Profile     Walter Vargas is a 78 y.o. male with a hx of multivessel CAD status post CABG x 5 chronic combined systolic and  diastolic heart failure/BiV failure, ischemic cardiomyopathy with EF of <20% with most recent echo showing LVEF 20-25%,  history of per-CABG VT, history of tobacco use , carotid artery disease s/p L carotid endarterectomy, falls who is being seen for the continued evaluation of CHF.   Assessment & Plan    Elevated troponin CAD s/p CABG x5 03/02/2023 -Likely due to supply demand mismatch with no convincing evidence of acute coronary syndrome. - Continue ASA, Plavix, and Lipitor He has no anginal symptoms at the present time.  No further ischemic cardiac evaluation is planned.  HFrEF -Ejection fraction was 20 to 25% by echo yesterday which seems to be stable.  I added Farxiga 10 mg daily yesterday. Not on beta-blockers due to baseline bradycardia. Continue small dose spironolactone. I will add small dose losartan today.  Severe sepsis PNA - Antibiotics per IM  Carotid artery stenosis s/p L endarterectomy - Performed 04/25/23 by [facility]  History of frequent nonsustained ventricular tachycardia post CABG - Continue amiodarone 200 mg daily  Hx CVA - ASA and plavix   For questions or updates, please contact [facility] Please consult [email or web address] for contact info under        Signed, Lorine Bears, MD  06/05/2023, 9:28 AM

## 2023-06-06 ENCOUNTER — Ambulatory Visit: Payer: 59 | Admitting: Nurse Practitioner

## 2023-06-06 DIAGNOSIS — W19XXXA Unspecified fall, initial encounter: Secondary | ICD-10-CM

## 2023-06-06 DIAGNOSIS — J189 Pneumonia, unspecified organism: Secondary | ICD-10-CM | POA: Diagnosis not present

## 2023-06-06 DIAGNOSIS — Z8673 Personal history of transient ischemic attack (TIA), and cerebral infarction without residual deficits: Secondary | ICD-10-CM

## 2023-06-06 DIAGNOSIS — I739 Peripheral vascular disease, unspecified: Secondary | ICD-10-CM

## 2023-06-06 DIAGNOSIS — R102 Pelvic and perineal pain unspecified side: Secondary | ICD-10-CM

## 2023-06-06 DIAGNOSIS — I25118 Atherosclerotic heart disease of native coronary artery with other forms of angina pectoris: Secondary | ICD-10-CM | POA: Diagnosis not present

## 2023-06-06 DIAGNOSIS — I255 Ischemic cardiomyopathy: Secondary | ICD-10-CM | POA: Diagnosis not present

## 2023-06-06 DIAGNOSIS — A419 Sepsis, unspecified organism: Secondary | ICD-10-CM | POA: Diagnosis not present

## 2023-06-06 DIAGNOSIS — J9601 Acute respiratory failure with hypoxia: Secondary | ICD-10-CM

## 2023-06-06 DIAGNOSIS — R6520 Severe sepsis without septic shock: Secondary | ICD-10-CM | POA: Diagnosis not present

## 2023-06-06 LAB — CULTURE, BLOOD (ROUTINE X 2)
Culture: NO GROWTH
Culture: NO GROWTH
Special Requests: ADEQUATE

## 2023-06-06 MED ORDER — CHLORHEXIDINE GLUCONATE CLOTH 2 % EX PADS
6.0000 | MEDICATED_PAD | Freq: Every day | CUTANEOUS | Status: DC
  Administered 2023-06-06 – 2023-06-07 (×2): 6 via TOPICAL

## 2023-06-06 MED ORDER — GUAIFENESIN-DM 100-10 MG/5ML PO SYRP
5.0000 mL | ORAL_SOLUTION | ORAL | Status: DC | PRN
  Administered 2023-06-06 – 2023-06-10 (×3): 5 mL via ORAL
  Filled 2023-06-06 (×4): qty 10

## 2023-06-06 MED ORDER — MUPIROCIN 2 % EX OINT
1.0000 | TOPICAL_OINTMENT | Freq: Two times a day (BID) | CUTANEOUS | Status: AC
  Administered 2023-06-06 – 2023-06-10 (×10): 1 via NASAL
  Filled 2023-06-06: qty 22

## 2023-06-06 NOTE — TOC Progression Note (Signed)
 Transition of Care [facility]) - Progression Note    Patient Details  Name: Walter Vargas MRN: [id number] Date of Birth: 01/28/1946  Transition of Care [facility]) CM/SW Contact  Margarito Liner, LCSW Phone Number: 06/06/2023, 3:19 PM  Clinical Narrative:   Received call from son. 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 Son will follow back up with him in about an hour and call CSW back.  Expected Discharge Plan: Skilled Nursing Facility Barriers to Discharge: Continued Medical Work up  Expected Discharge Plan and Services       Living arrangements for the past 2 months: Single Family Home                                       Social Determinants of Health (SDOH) Interventions SDOH Screenings   Food Insecurity: No Food Insecurity (06/01/2023)  Housing: Low Risk  (06/01/2023)  Transportation Needs: No Transportation Needs (06/01/2023)  Utilities: Not At Risk (06/01/2023)  Depression (PHQ2-9): Low Risk  (10/19/2022)  Social Connections: Socially Isolated (06/01/2023)  Tobacco Use: Medium Risk (06/01/2023)    Readmission Risk Interventions    03/20/2023   12:12 PM  Readmission Risk Prevention Plan  Transportation Screening Complete  Medication Review Oceanographer) Complete  HRI or Home Care Consult Complete  SW Recovery Care/Counseling Consult Complete  Palliative Care Screening Not Applicable  Skilled Nursing Facility Not Applicable

## 2023-06-06 NOTE — Progress Notes (Signed)
 Physical Therapy Treatment Patient Details Name: Walter Vargas MRN: [id number] DOB: 12/10/1945 Today's Date: 06/06/2023   History of Present Illness 78 year old male with history of GERD, hyperlipidemia, CAD status post CABG, on Eliquis and amiodarone, carotid enterectomy, who presented to emergency department after a f fall.  Patient was pending discharge to skilled nursing facility in the ED since 05/24/2023, but was noted to have hypoxic respite failure, desatting to 80% on room air.  Now admitted with pneumonia.    PT Comments  Pt was long sitting in bed upon arrival. A and O x 2. Completely unaware he has had a BM in bed and on himself. Assisted with hygiene care prior to getting OOB/ambulation in hallway. Pt remains pleasantly confused throughout session. He remains at high risk of falls (especially with turning) even with constant use of RW. Pt will benefit from continued skilled PT to maximize his independence and safety with all ADLs. DC recs remain appropriate.    If plan is discharge home, recommend the following: A little help with walking and/or transfers;Assistance with cooking/housework;Direct supervision/assist for medications management;Direct supervision/assist for financial management;Assist for transportation;Help with stairs or ramp for entrance;Supervision due to cognitive status     Equipment Recommendations  Other (comment) (Defer to next level of care)       Precautions / Restrictions Precautions Precautions: Fall Recall of Precautions/Restrictions: Impaired Restrictions Weight Bearing Restrictions Per Provider Order: No RUE Weight Bearing Per Provider Order: Weight bearing as tolerated LUE Weight Bearing Per Provider Order: Weight bearing as tolerated Other Position/Activity Restrictions: sternal precautions     Mobility  Bed Mobility Overal bed mobility: Needs Assistance Bed Mobility: Supine to Sit, Sit to Supine  Supine to sit: Min assist  General bed  mobility comments: Min assist to progress to EOB. pt completely unaware he has BM in bed/on himself.    Transfers Overall transfer level: Needs assistance Equipment used: Rolling walker (2 wheels) Transfers: Sit to/from Stand Sit to Stand: Min assist  General transfer comment: Min assist to stand from EOB and from recliner.    Ambulation/Gait Ambulation/Gait assistance: Contact guard assist, Min assist Gait Distance (Feet): 75 Feet Assistive device: Rolling walker (2 wheels) Gait Pattern/deviations: Step-through pattern, Trunk flexed Gait velocity: Decreased  General Gait Details: Min assist only during turns. CGA + vcs throughout for posture and safety improvements.    Balance Overall balance assessment: Needs assistance Sitting-balance support: Feet supported Sitting balance-Leahy Scale: Good     Standing balance support: Bilateral upper extremity supported, During functional activity, Reliant on assistive device for balance Standing balance-Leahy Scale: Fair Standing balance comment: Due to cognition/impusivity/ poor awareness, pt is at risk of falls.      Communication Communication Communication: No apparent difficulties  Cognition Arousal: Alert Behavior During Therapy: WFL for tasks assessed/performed   PT - Cognitive impairments: No family/caregiver present to determine baseline   Orientation impairments: Time, Situation  PT - Cognition Comments: Pt is alert but disoriented x 3. He is pleasantly confused throughout. Is able to follow simple commands throughout. Following commands: Intact      Cueing Cueing Techniques: Verbal cues         Pertinent Vitals/Pain Pain Assessment Pain Assessment: 0-10 Pain Score: 2  Pain Location: L hip/ groin Pain Descriptors / Indicators: Discomfort, Grimacing Pain Intervention(s): Limited activity within patient's tolerance, Monitored during session, Premedicated before session, Repositioned     PT Goals (current goals  can now be found in the care plan section) Acute Rehab PT Goals  Patient Stated Goal: none stated Progress towards PT goals: Progressing toward goals    Frequency    Min 2X/week           Co-evaluation     PT goals addressed during session: Mobility/safety with mobility;Balance;Proper use of DME;Strengthening/ROM        AM-PAC PT "6 Clicks" Mobility   Outcome Measure  Help needed turning from your back to your side while in a flat bed without using bedrails?: A Little Help needed moving from lying on your back to sitting on the side of a flat bed without using bedrails?: A Little Help needed moving to and from a bed to a chair (including a wheelchair)?: A Little Help needed standing up from a chair using your arms (e.g., wheelchair or bedside chair)?: A Little Help needed to walk in hospital room?: A Little Help needed climbing 3-5 steps with a railing? : A Lot 6 Click Score: 17    End of Session   Activity Tolerance: Patient tolerated treatment well Patient left: in chair;with call bell/phone within reach;with chair alarm set Nurse Communication: Mobility status PT Visit Diagnosis: Other abnormalities of gait and mobility (R26.89);Difficulty in walking, not elsewhere classified (R26.2);Muscle weakness (generalized) (M62.81);Unsteadiness on feet (R26.81);Repeated falls (R29.6)     Time: 1029-1050 PT Time Calculation (min) (ACUTE ONLY): 21 min  Charges:    $Therapeutic Activity: 8-22 mins PT General Charges $$ ACUTE PT VISIT: 1 Visit                     Jetta Lout PTA 06/06/23, 1:12 PM

## 2023-06-06 NOTE — Progress Notes (Signed)
 Mobility Specialist - Progress Note   06/06/23 1155  Mobility  Activity Stood at bedside;Transferred to/from [facility];Ambulated with assistance in room  Level of Assistance Contact guard assist, steadying assist  Assistive Device Front wheel walker  Distance Ambulated (ft) 6 ft  Activity Response Tolerated well  Mobility visit 1 Mobility  Mobility Specialist Start Time (ACUTE ONLY) 1102  Mobility Specialist Stop Time (ACUTE ONLY) 1119  Mobility Specialist Time Calculation (min) (ACUTE ONLY) 17 min   MS responding to chair alarm. Pt sitting in the recliner, expressed having two BM while seated. Pt STS to RW MinG and transferred to the [facility] CGA-MinG. Pt stood at the [facility] for ~52mins while patient advocate completed pericare. Pt transferred to the recliner, left seated with alarm set and needs within reach.  Zetta Bills Mobility Specialist 06/06/23 12:01 PM

## 2023-06-06 NOTE — Progress Notes (Signed)
 Progress Note   Patient: Walter Vargas ZOX:[id number] DOB: 02-22-1946 DOA: 05/24/2023     5 DOS: the patient was seen and examined on 06/06/2023     Brief hospital course: HPI on admission 06/01/23: "Walter Vargas is a 78 year old male with history of GERD, hyperlipidemia, CAD status post CABG, on Eliquis and amiodarone, carotid enterectomy, who presents emergency department for chief concerns of falling.   Patient was pending discharge to skilled nursing facility in the ED since 05/24/2023.   On day of admission, patient was noted to have hypoxic respite failure, desatting to 80% on room air.  Patient was placed on 6 L nasal cannula with improvement to 94%.  Patient has been titrated down to 3 L nasal cannula at this time with SpO2 100% on room air.  And during hypoxic episode, patient was noted to be altered and trying to pull out leads and oxygen supplementation.  Patient is currently more redirectable.   Vitals in the ED showed temperature of 98.9, respiration rate of 26, heart rate 94, blood pressure 125/70, SpO2 of 80% on room air, currently improved to 1 high percent on 3 L nasal cannula...." See H&P for full HPI on admission & ED course.   Patient was admitted for further evaluation and management of severe sepsis due  multifocal pneumonia complicated by acute respiratory failure with hypoxia.   Further hospital course and management as outlined below.       Assessment and Plan:     Severe sepsis due to Multifocal Pneumonia, suspect hospital-acquired Pt was boarding in the ED awaiting discharge to SNF for 7 days prior to admission. Severe sepsis evidenced by tachypnea, leukocytosis, lactic acidosis with pulmonary and cardiac organ involvement in setting of acute hypoxic respiratory failure --Treated with IV Vancomymin, Cefepime & Doxycycline  --Transitioned to PO antibiotics -- Doxycycline and Augmentin --Mucolytics, bronchodilators and other supportive care per orders Follow-up  on culture results --Monitor fever curve, CBC, hemodynamics   Acute respiratory failure with hypoxia - due to PNA, pleural effusions, ?CHF --Mgmt of underlying issues as outlined --O2 per protocol, wean as tolerated, target sats > 90%   Chronic combined systolic and diastolic CHF  History of VT (peri-CABG surgery) Abnormal EKG Elevated Troponins likely Demand Ischemia in setting of sepsis and hypoxia Hx of CAD s/p CABGx5 (Dec 2024) Echo 04/20/2023: EF ~ 20--25%, grade II DD Given Lasix 40 mg IV x 1 on admission BNP elevated 3075 (note baseline BNP appears ~2200+) Cardiology on board we appreciate input --Stopped digoxin due to bradycardia --Continue amiodarone 200 mg daily Continue Lasix 20 mg PO daily. Aldactone 12.5 mg daily, low dose losartan  Continue aspirin, Plavix, Lipitor Monitor input and output Daily weighing Has completed a course of heparin drip --Stat EKG and repeat troponin if chest pain --Maintain K>4, Mg>2 for hx of VT   Bilateral pleural effusions Continue to monitor input and output --Will consider thoracentesis if needed   Normocytic anemia --Heparin drip has been stopped Monitor CBC closely --Monitor for signs of bleeding --Transfuse RBC's if Hbg < 8 cardiac history   Hyperlipidemia Continue Lipitor   CAD - stable S/P CABG x 5 Continue aspirin and Lipitor    Protein-calorie malnutrition, severe Dietitian on board   Subjective:  Patient seen and examined at bedside this morning Denies nausea vomiting abdominal pain or chest pain Medically ready awaiting placement  General exam: awake & alert, no acute distress, frail, chronically ill appearing HEENT: moist mucus membranes, hearing grossly normal  Respiratory  system: CTAB, no wheezes, normal respiratory effort at rest on room air Cardiovascular system: normal S1/S2, RRR, no JVD, no pedal edema.   Gastrointestinal system: soft, NT, ND Central nervous system: A&O x self and hospital. no gross  focal neurologic deficits, normal speech Extremities: moves all, no edema, normal tone Psychiatry: normal mood, congruent affect  Family Communication: None present, will attempt to call as time allows   Disposition: Status is: Inpatient Remains inpatient appropriate because: severity of illness as above, on IV therapies, ongoing evaluation    Planned Discharge Destination: Skilled nursing facility    Time spent: 41 minutes  Data Reviewed:    Latest Ref Rng & Units 06/04/2023    7:19 AM 06/03/2023    1:12 AM 06/02/2023    3:35 AM  BMP  Glucose 70 - 99 mg/dL 244  87  89   BUN 8 - 23 mg/dL 18  25  23    Creatinine 0.61 - 1.24 mg/dL 0.10  2.72  5.36   Sodium 135 - 145 mmol/L 135  134  134   Potassium 3.5 - 5.1 mmol/L 3.9  3.5  4.1   Chloride 98 - 111 mmol/L 106  106  103   CO2 22 - 32 mmol/L 23  23  26    Calcium 8.9 - 10.3 mg/dL 8.1  7.8  8.3        Latest Ref Rng & Units 06/04/2023    7:19 AM 06/03/2023    1:12 AM 06/02/2023    3:35 AM  CBC  WBC 4.0 - 10.5 K/uL 6.1  5.1  8.5   Hemoglobin 13.0 - 17.0 g/dL 9.3  8.2  8.5   Hematocrit 39.0 - 52.0 % 30.1  26.8  27.9   Platelets 150 - 400 K/uL 322  295  291      Vitals:   06/06/23 0500 06/06/23 0740 06/06/23 0934 06/06/23 1507  BP: 124/69 (!) 92/47 122/66 (!) 115/55  Pulse: 61 (!) 59 (!) 57 60  Resp: 18 16  17   Temp: 97.6 F (36.4 C) (!) 97.4 F (36.3 C)    TempSrc: Axillary Oral    SpO2: 97% 99%  100%  Weight: 57.5 kg     Height:         Author: Loyce Dys, MD 06/06/2023 5:15 PM  For on call review [email or web address].

## 2023-06-06 NOTE — Progress Notes (Signed)
 Occupational Therapy Treatment Patient Details Name: Walter Vargas MRN: [id number] DOB: June 20, 1945 Today's Date: 06/06/2023   History of present illness 78 year old male with history of GERD, hyperlipidemia, CAD status post CABG, on Eliquis and amiodarone, carotid enterectomy, who presented to emergency department after a fall.  Patient was in the ED from 05/24/2023 -06/01/23 pending discharge to skilled nursing facility, but was noted to have hypoxic respite failure, desatting to 80% on room air.  Now admitted with pneumonia.   OT comments  Mr Klutz was seen for OT treatment on this date. Upon arrival to room pt in bed, agreeable to tx. Pt requires MIN A + HHA sit<>stand. CGA standing grooming tasks, assist for setup and minor LOBs - pt self corrects with UE support on counter. Pt perseverative on calling bank to discuss finances for STR, assisted with use of phone to find contact. Goals updated to reflect progress, will continue to follow POC. Discharge recommendation remains appropriate.        If plan is discharge home, recommend the following:  A little help with walking and/or transfers;A little help with bathing/dressing/bathroom;Assistance with cooking/housework;Assist for transportation;Help with stairs or ramp for entrance;Direct supervision/assist for financial management;Direct supervision/assist for medications management;Supervision due to cognitive status   Equipment Recommendations  Other (comment)    Recommendations for Other Services      Precautions / Restrictions Precautions Precautions: Fall Recall of Precautions/Restrictions: Impaired Restrictions Weight Bearing Restrictions Per Provider Order: No       Mobility Bed Mobility Overal bed mobility: Needs Assistance Bed Mobility: Supine to Sit, Sit to Supine     Supine to sit: Supervision Sit to supine: Supervision        Transfers Overall transfer level: Needs assistance Equipment used: 1 person hand held  assist Transfers: Sit to/from Stand Sit to Stand: Min assist                 Balance Overall balance assessment: Needs assistance Sitting-balance support: Feet supported Sitting balance-Leahy Scale: Good     Standing balance support: No upper extremity supported, During functional activity Standing balance-Leahy Scale: Fair                             ADL either performed or assessed with clinical judgement   ADL Overall ADL's : Needs assistance/impaired                                       General ADL Comments: MIN A + HHA for ADL t/f ~5 ft. CGA standing grooming tasks, assist for setup    Extremity/Trunk Assessment Upper Extremity Assessment Upper Extremity Assessment: Overall WFL for tasks assessed   Lower Extremity Assessment Lower Extremity Assessment: Generalized weakness                 Communication Communication Communication: No apparent difficulties   Cognition Arousal: Alert Behavior During Therapy: WFL for tasks assessed/performed Cognition: Cognition impaired         Attention impairment (select first level of impairment): Sustained attention                     Following commands: Intact Following commands impaired: Follows multi-step commands inconsistently, Follows one step commands with increased time      Cueing   Cueing Techniques: Verbal cues  Exercises  Pertinent Vitals/ Pain       Pain Assessment Pain Assessment: 0-10 Pain Score: 2  Pain Location: B heels to touch Pain Descriptors / Indicators: Discomfort, Grimacing Pain Intervention(s): Limited activity within patient's tolerance, Repositioned   Frequency  Min 2X/week        Progress Toward Goals  OT Goals(current goals can now be found in the care plan section)  Progress towards OT goals: Progressing toward goals  Acute Rehab OT Goals Patient Stated Goal: to go home OT Goal Formulation: With patient Time  For Goal Achievement: 06/20/23 Potential to Achieve Goals: Fair ADL Goals Pt Will Perform Lower Body Dressing: with modified independence;sit to/from stand Pt Will Transfer to Toilet: with modified independence;ambulating;regular height toilet Pt Will Perform Toileting - Clothing Manipulation and hygiene: with modified independence;sitting/lateral leans;sit to/from stand Additional ADL Goal #1: pt will participate in further cognitive assessment/pill box assessment to facilitate improved IADL participation  Plan      Co-evaluation        PT goals addressed during session: Mobility/safety with mobility;Balance;Proper use of DME;Strengthening/ROM        AM-PAC OT "6 Clicks" Daily Activity     Outcome Measure   Help from another person eating meals?: None Help from another person taking care of personal grooming?: A Little Help from another person toileting, which includes using toliet, bedpan, or urinal?: A Little Help from another person bathing (including washing, rinsing, drying)?: A Lot Help from another person to put on and taking off regular upper body clothing?: A Little Help from another person to put on and taking off regular lower body clothing?: A Lot 6 Click Score: 17    End of Session Equipment Utilized During Treatment: Rolling walker (2 wheels)  OT Visit Diagnosis: Unsteadiness on feet (R26.81);Other abnormalities of gait and mobility (R26.89);Muscle weakness (generalized) (M62.81);Other symptoms and signs involving cognitive function   Activity Tolerance Patient tolerated treatment well   Patient Left in bed;with call bell/phone within reach;with bed alarm set   Nurse Communication          Time: 1610-9604 OT Time Calculation (min): 18 min  Charges: OT General Charges $OT Visit: 1 Visit OT Treatments $Self Care/Home Management : 8-22 mins  Kathie Dike, M.S. OTR/L  06/06/23, 3:41 PM  ascom [phone]

## 2023-06-06 NOTE — Progress Notes (Signed)
 Rounding Note    Patient Name: Walter Vargas Date of Encounter: 06/06/2023  [facility] Cardiologist: Walter Odea, MD   Subjective   Doing well today without chest pain. Shortness of breath improving. BP stable.   Inpatient Medications    Scheduled Meds:  amiodarone  200 mg Oral Daily   amoxicillin-clavulanate  1 tablet Oral Q12H   ascorbic acid  500 mg Oral BID   aspirin  81 mg Oral Daily   dapagliflozin propanediol  10 mg Oral Daily   dextromethorphan-guaiFENesin  1 tablet Oral BID   doxycycline  100 mg Oral Q12H   feeding supplement  237 mL Oral BID BM   furosemide  20 mg Oral Daily   losartan  25 mg Oral Daily   multivitamin with minerals  1 tablet Oral Daily   pantoprazole  40 mg Oral BID AC   polyethylene glycol  17 g Oral Daily   senna-docusate  1 tablet Oral BID   spironolactone  12.5 mg Oral Daily   zinc sulfate (50mg  elemental zinc)  220 mg Oral Daily   Continuous Infusions:  PRN Meds: acetaminophen **OR** acetaminophen, hydrOXYzine, ondansetron **OR** ondansetron (ZOFRAN) IV   Vital Signs    Vitals:   06/05/23 1558 06/05/23 2108 06/06/23 0500 06/06/23 0740  BP: (!) 115/56 115/86 124/69 (!) 92/47  Pulse: (!) 57 61 61 (!) 59  Resp: 16 18 18 16   Temp: 98 F (36.7 C) 98 F (36.7 C) 97.6 F (36.4 C) (!) 97.4 F (36.3 C)  TempSrc:  Oral Axillary Oral  SpO2: 100% 100% 97% 99%  Weight:   57.5 kg   Height:        Intake/Output Summary (Last 24 hours) at 06/06/2023 0854 Last data filed at 06/06/2023 4696 Gross per 24 hour  Intake --  Output 550 ml  Net -550 ml      06/06/2023    5:00 AM 06/05/2023    5:00 AM 06/04/2023    4:33 AM  Last 3 Weights  Weight (lbs) 126 lb 12.2 oz 127 lb 6.8 oz 127 lb 10.3 oz  Weight (kg) 57.5 kg 57.8 kg 57.9 kg      Telemetry    Not on telemetry - Personally Reviewed  Physical Exam   GEN: Frail appearing. No acute distress.   Neck: No JVD Cardiac: RRR, no murmurs, rubs, or gallops.   Respiratory: Clear to auscultation bilaterally. GI: Soft, nontender, non-distended  MS: No edema; No deformity. Boots in place.  Neuro:  Nonfocal  Psych: Normal affect   Labs    High Sensitivity Troponin:   Recent Labs  Lab 05/24/23 2104 05/24/23 2321 06/01/23 1631 06/01/23 1809 06/02/23 0902  TROPONINIHS 30* 33* 26* 146* 239*     Chemistry Recent Labs  Lab 06/01/23 1631 06/02/23 0141 06/02/23 0335 06/03/23 0112 06/04/23 0719  NA 136  --  134* 134* 135  K 5.0  --  4.1 3.5 3.9  CL 105  --  103 106 106  CO2 22  --  26 23 23   GLUCOSE 120*  --  89 87 116*  BUN 22  --  23 25* 18  CREATININE 0.73  --  0.79 0.78 0.71  CALCIUM 8.5*  --  8.3* 7.8* 8.1*  MG  --  1.9  --  1.9  --   PROT 6.9  --   --   --   --   ALBUMIN 3.1*  --   --   --   --  AST 23  --   --   --   --   ALT 22  --   --   --   --   ALKPHOS 78  --   --   --   --   BILITOT 1.1  --   --   --   --   GFRNONAA >60  --  >60 >60 >60  ANIONGAP 9  --  5 5 6     Lipids No results for input(s): "CHOL", "TRIG", "HDL", "LABVLDL", "LDLCALC", "CHOLHDL" in the last 168 hours.  Hematology Recent Labs  Lab 06/02/23 0335 06/03/23 0112 06/04/23 0719  WBC 8.5 5.1 6.1  RBC 3.03* 2.96* 3.34*  HGB 8.5* 8.2* 9.3*  HCT 27.9* 26.8* 30.1*  MCV 92.1 90.5 90.1  MCH 28.1 27.7 27.8  MCHC 30.5 30.6 30.9  RDW 19.3* 19.3* 19.0*  PLT 291 295 322   Thyroid No results for input(s): "TSH", "FREET4" in the last 168 hours.  BNP Recent Labs  Lab 06/01/23 1631  BNP 3,074.9*    DDimer No results for input(s): "DDIMER" in the last 168 hours.   Radiology    No results found.  Cardiac Studies   06/04/2023 Echo complete 1. Left ventricular ejection fraction, by estimation, is 20 to 25%. Left  ventricular ejection fraction by 3D volume is 31 %. Left ventricular  ejection fraction by PLAX is 21 %. The left ventricle has severely  decreased function. The left ventricle has  no regional wall motion abnormalities. The left  ventricular internal  cavity size was mildly dilated. There is mild left ventricular  hypertrophy. Left ventricular diastolic parameters are indeterminate. The  average left ventricular global longitudinal  strain is -8.3 %. The global longitudinal strain is abnormal.   2. Right ventricular systolic function is normal. The right ventricular  size is normal. There is mildly elevated pulmonary artery systolic  pressure.   3. Left atrial size was moderately dilated.   4. Right atrial size was mildly dilated.   5. Moderate pleural effusion in the left lateral region.   6. The mitral valve is normal in structure. Mild to moderate mitral valve  regurgitation. No evidence of mitral stenosis.   7. The aortic valve is normal in structure. Aortic valve regurgitation is  not visualized. No aortic stenosis is present.   8. The inferior vena cava is normal in size with <50% respiratory  variability, suggesting right atrial pressure of 8 mmHg.    Echo 04/18/23 1. Left ventricular ejection fraction, by estimation, is 20 to 25%. The  left ventricle has severely decreased function. The left ventricle  demonstrates global hypokinesis with septal-lateral dyssynchrony  consistent with IVCD. The left ventricular  internal cavity size was mildly dilated. Left ventricular diastolic  parameters are consistent with Grade II diastolic dysfunction  (pseudonormalization).   2. Right ventricular systolic function is mildly reduced. The right  ventricular size is normal. Tricuspid regurgitation signal is inadequate  for assessing PA pressure.   3. Left atrial size was moderately dilated.   4. Right atrial size was mildly dilated.   5. The mitral valve is normal in structure. Mild mitral valve  regurgitation. No evidence of mitral stenosis.   6. The aortic valve is tricuspid. Aortic valve regurgitation is not  visualized. No aortic stenosis is present.   7. The inferior vena cava is normal in size with greater  than 50%  respiratory variability, suggesting right atrial pressure of 3 mmHg.    R/L heart  cath 02/2023 Conclusions: Severe three-vessel coronary artery disease, as detailed below. Normal left and right heart filling pressures. Mild-moderately reduced Fick cardiac output/index.  Patient Profile     Walter Vargas is a 78 y.o. 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   Assessment & Plan    Elevated troponin CAD s/p CABG x5 03/02/2023 - Likely secondary to supply/demand mismatch with no convincing evidence of acute coronary syndrome - Continue aspirin, Plavix, and Lipitor - Not on BB due to baseline bradycardia - Denies anginal symptoms at this time - No further ischemic evaluation indicated  HFrEF - Pre-CABG echo<20% moderately reduced RV. Intra op TEE showed EF20%. - POCUS 12/25 showed LVEF 25-30% improved RV - Echo 2/7 LVEF 20-25%, global HK, G2DD, mildly reduced RV, mild MR - Discharged to CIR on on lasix 20mg  daily, Farxiga 10mg  daily, spiro 12.5mg  daily, losartan 12.5mg  daily however appears PTA he was only on digoxin. Notes suggest during CIR GDMT not tolerated d/t orthostasis/hypotension - PTA digoxin stopped 3/23 secondary to bradycardia - Echo 3/24 with EF 20 to 25%, stable from previous - Appears euvolemic on exam  - Kidney function stable  - Continue to monitor kidney function, strict I/Os, and daily weights - Continue Farxiga 10 mg daily, lasix 20 mg daily, losartan 25 mg daily, and spironolactone 12.5 mg daily - Further advancement of GDMT limited by bradycardia and hypotension  Severe sepsis PNA - Antibiotics per IM   Carotid artery stenosis  s/p L endarterectomy - Performed 04/25/23 by [facility]   Hx VT per CABG - Continue amiodarone 200 mg daily   Hx CVA - ASA and plavix   For questions or updates, please contact [facility] Please consult [email or web address] for contact info under        Signed, Orion Crook, PA-C  06/06/2023, 8:54 AM

## 2023-06-07 DIAGNOSIS — J189 Pneumonia, unspecified organism: Secondary | ICD-10-CM | POA: Diagnosis not present

## 2023-06-07 LAB — BASIC METABOLIC PANEL WITH GFR
Anion gap: 7 (ref 5–15)
BUN: 16 mg/dL (ref 8–23)
CO2: 26 mmol/L (ref 22–32)
Calcium: 8.5 mg/dL — ABNORMAL LOW (ref 8.9–10.3)
Chloride: 102 mmol/L (ref 98–111)
Creatinine, Ser: 0.59 mg/dL — ABNORMAL LOW (ref 0.61–1.24)
GFR, Estimated: >60 mL/min (ref >60)
Glucose, Bld: 86 mg/dL (ref 70–99)
Potassium: 4.1 mmol/L (ref 3.5–5.1)
Sodium: 135 mmol/L (ref 135–145)

## 2023-06-07 LAB — CBC WITH DIFFERENTIAL/PLATELET
Abs Immature Granulocytes: 0.03 10*3/uL (ref 0.00–0.07)
Basophils Absolute: 0 10*3/uL (ref 0.0–0.1)
Basophils Relative: 1 %
Eosinophils Absolute: 0.4 10*3/uL (ref 0.0–0.5)
Eosinophils Relative: 6 %
HCT: 31.1 % — ABNORMAL LOW (ref 39.0–52.0)
Hemoglobin: 9.5 g/dL — ABNORMAL LOW (ref 13.0–17.0)
Immature Granulocytes: 1 %
Lymphocytes Relative: 20 %
Lymphs Abs: 1.3 10*3/uL (ref 0.7–4.0)
MCH: 27.2 pg (ref 26.0–34.0)
MCHC: 30.5 g/dL (ref 30.0–36.0)
MCV: 89.1 fL (ref 80.0–100.0)
Monocytes Absolute: 0.8 10*3/uL (ref 0.1–1.0)
Monocytes Relative: 13 %
Neutro Abs: 4.1 10*3/uL (ref 1.7–7.7)
Neutrophils Relative %: 59 %
Platelets: 383 10*3/uL (ref 150–400)
RBC: 3.49 MIL/uL — ABNORMAL LOW (ref 4.22–5.81)
RDW: 19.6 % — ABNORMAL HIGH (ref 11.5–15.5)
WBC: 6.6 10*3/uL (ref 4.0–10.5)
nRBC: 0 % (ref 0.0–0.2)

## 2023-06-07 NOTE — Progress Notes (Signed)
 Progress Note   Patient: Walter Vargas:[id number] DOB: 1946/02/25 DOA: 05/24/2023     6 DOS: the patient was seen and examined on 06/07/2023    Brief hospital course: HPI on admission 06/01/23: "Walter Vargas is a 78 year old male with history of GERD, hyperlipidemia, CAD status post CABG, on Eliquis and amiodarone, carotid enterectomy, who presents emergency department for chief concerns of falling.   Patient was pending discharge to skilled nursing facility in the ED since 05/24/2023.   On day of admission, patient was noted to have hypoxic respite failure, desatting to 80% on room air.  Patient was placed on 6 L nasal cannula with improvement to 94%.  Patient has been titrated down to 3 L nasal cannula at this time with SpO2 100% on room air.  And during hypoxic episode, patient was noted to be altered and trying to pull out leads and oxygen supplementation.  Patient is currently more redirectable.   Vitals in the ED showed temperature of 98.9, respiration rate of 26, heart rate 94, blood pressure 125/70, SpO2 of 80% on room air, currently improved to 1 high percent on 3 L nasal cannula...." See H&P for full HPI on admission & ED course.   Patient was admitted for further evaluation and management of severe sepsis due  multifocal pneumonia complicated by acute respiratory failure with hypoxia.   Further hospital course and management as outlined below.       Assessment and Plan:     Severe sepsis due to Multifocal Pneumonia, suspect hospital-acquired Pt was boarding in the ED awaiting discharge to SNF for 7 days prior to admission. Severe sepsis evidenced by tachypnea, leukocytosis, lactic acidosis with pulmonary and cardiac organ involvement in setting of acute hypoxic respiratory failure --Treated with IV Vancomymin, Cefepime & Doxycycline  --Transitioned to PO antibiotics -- Doxycycline and Augmentin --Mucolytics, bronchodilators and other supportive care per orders Follow-up on  culture results --Monitor fever curve, CBC, hemodynamics   Acute respiratory failure with hypoxia - due to PNA, pleural effusions, ?CHF --Mgmt of underlying issues as outlined --O2 per protocol, wean as tolerated, target sats > 90%   Chronic combined systolic and diastolic CHF  History of VT (peri-CABG surgery) Abnormal EKG Elevated Troponins likely Demand Ischemia in setting of sepsis and hypoxia Hx of CAD s/p CABGx5 (Dec 2024) Echo 04/20/2023: EF ~ 20--25%, grade II DD Given Lasix 40 mg IV x 1 on admission BNP elevated 3075 (note baseline BNP appears ~2200+) Cardiology on board we appreciate input --Stopped digoxin due to bradycardia --Continue amiodarone 200 mg daily Continue Lasix 20 mg PO daily. Aldactone 12.5 mg daily, low dose losartan  Continue aspirin, Plavix, Lipitor Monitor input and output Continue daily weight Has completed a course of heparin drip --Stat EKG and repeat troponin if chest pain --Maintain K>4, Mg>2 for hx of VT   Bilateral pleural effusions Continue to monitor input and output --Will consider thoracentesis if needed   Normocytic anemia Monitor for signs of bleeding as well as CBC We will transfuse RBC's if Hbg < 8 cardiac history   Hyperlipidemia Continue Lipitor   CAD - stable S/P CABG x 5 Continue aspirin and Lipitor    Protein-calorie malnutrition, severe Dietitian on board   Subjective:  Patient seen and examined at bedside this morning TOC manager still working on placement He denies any acute overnight events  Physical examination General exam: awake & alert, no acute distress, frail, chronically ill appearing HEENT: moist mucus membranes, hearing grossly normal  Respiratory  system: CTAB, no wheezes, normal respiratory effort at rest on room air Cardiovascular system: normal S1/S2, RRR, no JVD, no pedal edema.   Gastrointestinal system: soft, NT, ND Central nervous system: A&O x self and hospital. no gross focal neurologic  deficits, normal speech Extremities: moves all, no edema, normal tone Psychiatry: normal mood, congruent affect   Family Communication: None present, will attempt to call as time allows   Disposition: Status is: Inpatient Remains inpatient appropriate because: severity of illness as above, on IV therapies, ongoing evaluation    Planned Discharge Destination: Skilled nursing facility     Data Reviewed:    Vitals:   06/07/23 0500 06/07/23 0522 06/07/23 0806 06/07/23 1542  BP:  117/68 108/65 (!) 117/54  Pulse:  (!) 56 (!) 58 (!) 56  Resp:  16 17 17   Temp:  97.8 F (36.6 C) 98.2 F (36.8 C) 98.3 F (36.8 C)  TempSrc:   Oral   SpO2:  100% 97% 98%  Weight: 59.9 kg     Height:          Latest Ref Rng & Units 06/07/2023    4:43 AM 06/04/2023    7:19 AM 06/03/2023    1:12 AM  CBC  WBC 4.0 - 10.5 K/uL 6.6  6.1  5.1   Hemoglobin 13.0 - 17.0 g/dL 9.5  9.3  8.2   Hematocrit 39.0 - 52.0 % 31.1  30.1  26.8   Platelets 150 - 400 K/uL 383  322  295        Latest Ref Rng & Units 06/07/2023    4:43 AM 06/04/2023    7:19 AM 06/03/2023    1:12 AM  BMP  Glucose 70 - 99 mg/dL 86  161  87   BUN 8 - 23 mg/dL 16  18  25    Creatinine 0.61 - 1.24 mg/dL 0.96  0.45  4.09   Sodium 135 - 145 mmol/L 135  135  134   Potassium 3.5 - 5.1 mmol/L 4.1  3.9  3.5   Chloride 98 - 111 mmol/L 102  106  106   CO2 22 - 32 mmol/L 26  23  23    Calcium 8.9 - 10.3 mg/dL 8.5  8.1  7.8      Time spent: 42 minutes  Author: Loyce Dys, MD 06/07/2023 5:51 PM  For on call review [email or web address].

## 2023-06-07 NOTE — Progress Notes (Signed)
 Physical Therapy Treatment Patient Details Name: Walter Vargas MRN: [id number] DOB: [birth year] Today's Date: 06/07/2023   History of Present Illness 78 year old male with history of GERD, hyperlipidemia, CAD status post CABG, on Eliquis and amiodarone, carotid enterectomy, who presented to emergency department after a fall.  Patient was in the ED from 05/24/2023 -06/01/23 pending discharge to skilled nursing facility, but was noted to have hypoxic respite failure, desatting to 80% on room air.  Now admitted with pneumonia.    PT Comments  Pt was long sitting in bed upon arrival. He is alert and O x 2. Pleasantly confused but consistently abl to follow commands. Pt demonstrated improved abilities to safely exit bed, stand to RW, and tolerate ambulation 200 ft. Vcs throughout session for safety and improved technique and sequencing. Overall pt is progressing. He will continue to benefit from skilled PT at DC to maximize his independence and safety with all ADLs.    If plan is discharge home, recommend the following: A little help with walking and/or transfers;Assistance with cooking/housework;Direct supervision/assist for medications management;Direct supervision/assist for financial management;Assist for transportation;Help with stairs or ramp for entrance;Supervision due to cognitive status   Can travel by private vehicle     No  Equipment Recommendations  Other (comment) (Defer to next level of care)       Precautions / Restrictions Precautions Precautions: Fall Recall of Precautions/Restrictions: Impaired Restrictions Weight Bearing Restrictions Per Provider Order: No     Mobility  Bed Mobility Overal bed mobility: Needs Assistance Bed Mobility: Supine to Sit, Sit to Supine  Supine to sit: Supervision Sit to supine: Supervision   Transfers Overall transfer level: Needs assistance Equipment used: Rolling walker (2 wheels) Transfers: Sit to/from Stand Sit to Stand: Contact guard  assist  General transfer comment: CGA for safety with vcs for handplacement and technique improvements    Ambulation/Gait Ambulation/Gait assistance: Contact guard assist Gait Distance (Feet): 200 Feet Assistive device: Rolling walker (2 wheels) Gait Pattern/deviations: Step-through pattern Gait velocity: Decreased  General Gait Details: pt was able to ambulate ~ 200 ft with RW. vcs to widen BOS, improve posture, stay inside RW. and to slow down.    Balance Overall balance assessment: Needs assistance Sitting-balance support: Feet supported Sitting balance-Leahy Scale: Good     Standing balance support: Bilateral upper extremity supported, During functional activity Standing balance-Leahy Scale: Fair          Cognition Arousal: Alert Behavior During Therapy: WFL for tasks assessed/performed   PT - Cognitive impairments: History of cognitive impairments   Orientation impairments: Time, Situation   PT - Cognition Comments: Pt is alert but disoriented x 2. He is pleasantly confused throughout. Is able to follow simple commands without issues Following commands: Intact      Cueing Cueing Techniques: Verbal cues         Pertinent Vitals/Pain Pain Assessment Pain Assessment: 0-10 Pain Score: 6  Pain Location: LLE/foot Pain Descriptors / Indicators: Discomfort, Grimacing Pain Intervention(s): Limited activity within patient's tolerance, Monitored during session, Repositioned     PT Goals (current goals can now be found in the care plan section) Acute Rehab PT Goals Patient Stated Goal: none stated Progress towards PT goals: Progressing toward goals    Frequency    Min 2X/week       Co-evaluation     PT goals addressed during session: Mobility/safety with mobility;Balance;Proper use of DME;Strengthening/ROM        AM-PAC PT "6 Clicks" Mobility   Outcome Measure  Help  needed turning from your back to your side while in a flat bed without using bedrails?:  A Little Help needed moving from lying on your back to sitting on the side of a flat bed without using bedrails?: A Little Help needed moving to and from a bed to a chair (including a wheelchair)?: A Little Help needed standing up from a chair using your arms (e.g., wheelchair or bedside chair)?: A Little Help needed to walk in hospital room?: A Little Help needed climbing 3-5 steps with a railing? : A Little 6 Click Score: 18    End of Session   Activity Tolerance: Patient tolerated treatment well Patient left: in chair;with call bell/phone within reach;with chair alarm set Nurse Communication: Mobility status PT Visit Diagnosis: Other abnormalities of gait and mobility (R26.89);Difficulty in walking, not elsewhere classified (R26.2);Muscle weakness (generalized) (M62.81);Unsteadiness on feet (R26.81);Repeated falls (R29.6)     Time: 7846-9629 PT Time Calculation (min) (ACUTE ONLY): 10 min  Charges:    $Gait Training: 8-22 mins PT General Charges $$ ACUTE PT VISIT: 1 Visit                    Jetta Lout PTA 06/07/23, 10:42 AM

## 2023-06-07 NOTE — Progress Notes (Signed)
 Nutrition Follow-up  DOCUMENTATION CODES:   Severe malnutrition in context of chronic illness  INTERVENTION:   -Continue MVI with minerals daily -Continue 500 mg vitamin C BID -Continue 220 mg zinc sulfate daily x 14 days -Continue Ensure Enlive po BID, each supplement provides 350 kcal and 20 grams of protein.  -Continue Magic cup BID with meals, each supplement provides 290 kcal and 9 grams of protein  -Continue 2 gram sodium diet  NUTRITION DIAGNOSIS:   Severe Malnutrition related to chronic illness (CAD, CHF) as evidenced by moderate fat depletion, severe fat depletion, percent weight loss, moderate muscle depletion, severe muscle depletion.  Ongoing  GOAL:   Patient will meet greater than or equal to 90% of their needs  Progressing   MONITOR:   PO intake, Supplement acceptance  REASON FOR ASSESSMENT:   Consult Assessment of nutrition requirement/status  ASSESSMENT:   Pt with history of GERD, hyperlipidemia, CAD status post CABG, on Eliquis and amiodarone, carotid enterectomy, who presents for chief concerns of falling.  Reviewed I/O's: +297 ml x 24 hours and -1.3 L since admission  UOP: 300 ml x 24 hours  Pt receiving nursing care art time of visit.   Pt remains with good appetite. Noted meal completions 90-100%. Pt also drinking Ensure supplements.   Reviewed wt hx; pt has experienced a 9.7% wt loss over the past week. Suspect diuresis may be contributing to weight loss. RD will continue to monitor weight trends.   Per TOC notes, plan for SNF placement at discharge.   Medications reviewed and include vitamin C, farxiga, lasix, miralax, aldactone, protonix, senokot, and zinc sulfate.   Labs reviewed: CBGS: 71 (inpatient orders for glycemic control are none).    Diet Order:   Diet Order             Diet 2 gram sodium Fluid consistency: Thin  Diet effective now                   EDUCATION NEEDS:   Education needs have been addressed  Skin:   Skin Assessment: Skin Integrity Issues: Skin Integrity Issues:: Other (Comment), Stage II, Stage I Stage I: heel Stage II: coccyx Other: skin tears to lt arm and thigh  Last BM:  06/06/23 (type 4)  Height:   Ht Readings from Last 1 Encounters:  06/01/23 5\' 8"  (1.727 m)    Weight:   Wt Readings from Last 1 Encounters:  06/07/23 59.9 kg    Ideal Body Weight:  70 kg  BMI:  Body mass index is 20.08 kg/m.  Estimated Nutritional Needs:   Kcal:  1800-2000  Protein:  100-115 grams  Fluid:  1.8-2.0 L    Levada Schilling, RD, LDN, CDCES Registered Dietitian III Certified Diabetes Care and Education Specialist If unable to reach this RD, please use "RD Inpatient" group chat on secure chat between hours of 8am-4 pm daily

## 2023-06-07 NOTE — TOC Progression Note (Signed)
 Transition of Care [facility]) - Progression Note    Patient Details  Name: Walter Vargas MRN: [id number] Date of Birth: 1945/11/04  [facility] CM/SW Contact  Marlowe Sax, RN Phone Number: 06/07/2023, 9:09 AM  Clinical Narrative:    Reached out to son Marion Downer) Cherrie Gauze Emergency Contact [phone] and was not able to reach, left a HIPAA complaint VM asking for a call back      Expected Discharge Plan: Skilled Nursing Facility Barriers to Discharge: Continued Medical Work up  Expected Discharge Plan and Services       Living arrangements for the past 2 months: Single Family Home                                       Social Determinants of Health (SDOH) Interventions SDOH Screenings   Food Insecurity: No Food Insecurity (06/01/2023)  Housing: Low Risk  (06/01/2023)  Transportation Needs: No Transportation Needs (06/01/2023)  Utilities: Not At Risk (06/01/2023)  Depression (PHQ2-9): Low Risk  (10/19/2022)  Social Connections: Socially Isolated (06/01/2023)  Tobacco Use: Medium Risk (06/01/2023)    Readmission Risk Interventions    03/20/2023   12:12 PM  Readmission Risk Prevention Plan  Transportation Screening Complete  Medication Review (RN Care Manager) Complete  HRI or Home Care Consult Complete  SW Recovery Care/Counseling Consult Complete  Palliative Care Screening Not Applicable  Skilled Nursing Facility Not Applicable

## 2023-06-08 DIAGNOSIS — J189 Pneumonia, unspecified organism: Secondary | ICD-10-CM | POA: Diagnosis not present

## 2023-06-08 LAB — CBC WITH DIFFERENTIAL/PLATELET
Abs Immature Granulocytes: 0.02 10*3/uL (ref 0.00–0.07)
Basophils Absolute: 0 10*3/uL (ref 0.0–0.1)
Basophils Relative: 1 %
Eosinophils Absolute: 0.3 10*3/uL (ref 0.0–0.5)
Eosinophils Relative: 4 %
HCT: 36.2 % — ABNORMAL LOW (ref 39.0–52.0)
Hemoglobin: 11 g/dL — ABNORMAL LOW (ref 13.0–17.0)
Immature Granulocytes: 0 %
Lymphocytes Relative: 21 %
Lymphs Abs: 1.4 10*3/uL (ref 0.7–4.0)
MCH: 27.6 pg (ref 26.0–34.0)
MCHC: 30.4 g/dL (ref 30.0–36.0)
MCV: 90.7 fL (ref 80.0–100.0)
Monocytes Absolute: 0.7 10*3/uL (ref 0.1–1.0)
Monocytes Relative: 11 %
Neutro Abs: 4.2 10*3/uL (ref 1.7–7.7)
Neutrophils Relative %: 63 %
Platelets: 393 10*3/uL (ref 150–400)
RBC: 3.99 MIL/uL — ABNORMAL LOW (ref 4.22–5.81)
RDW: 19.9 % — ABNORMAL HIGH (ref 11.5–15.5)
WBC: 6.6 10*3/uL (ref 4.0–10.5)
nRBC: 0 % (ref 0.0–0.2)

## 2023-06-08 LAB — BASIC METABOLIC PANEL WITH GFR
Anion gap: 7 (ref 5–15)
BUN: 15 mg/dL (ref 8–23)
CO2: 28 mmol/L (ref 22–32)
Calcium: 8.9 mg/dL (ref 8.9–10.3)
Chloride: 101 mmol/L (ref 98–111)
Creatinine, Ser: 0.65 mg/dL (ref 0.61–1.24)
GFR, Estimated: >60 mL/min (ref >60)
Glucose, Bld: 73 mg/dL (ref 70–99)
Potassium: 4.2 mmol/L (ref 3.5–5.1)
Sodium: 136 mmol/L (ref 135–145)

## 2023-06-08 MED ORDER — AMIODARONE HCL 200 MG PO TABS
100.0000 mg | ORAL_TABLET | Freq: Every day | ORAL | Status: DC
  Administered 2023-06-08 – 2023-06-22 (×15): 100 mg via ORAL
  Filled 2023-06-08 (×15): qty 1

## 2023-06-08 NOTE — Progress Notes (Signed)
 Progress Note   Patient: Walter Vargas ZOX:[id number] DOB: 1945-09-08 DOA: 05/24/2023     7 DOS: the patient was seen and examined on 06/08/2023   Brief hospital course: HPI on admission 06/01/23: "Chantz Montefusco is a 78 year old male with history of GERD, hyperlipidemia, CAD status post CABG, on Eliquis and amiodarone, carotid enterectomy, who presents emergency department for chief concerns of falling.   Patient was pending discharge to skilled nursing facility in the ED since 05/24/2023.   On day of admission, patient was noted to have hypoxic respite failure, desatting to 80% on room air.  Patient was placed on 6 L nasal cannula with improvement to 94%.  Patient has been titrated down to 3 L nasal cannula at this time with SpO2 100% on room air.  And during hypoxic episode, patient was noted to be altered and trying to pull out leads and oxygen supplementation.  Patient is currently more redirectable.   Vitals in the ED showed temperature of 98.9, respiration rate of 26, heart rate 94, blood pressure 125/70, SpO2 of 80% on room air, currently improved to 1 high percent on 3 L nasal cannula...." See H&P for full HPI on admission & ED course.   Patient was admitted for further evaluation and management of severe sepsis due  multifocal pneumonia complicated by acute respiratory failure with hypoxia.   Further hospital course and management as outlined below.       Assessment and Plan:     Severe sepsis due to Multifocal Pneumonia, suspect hospital-acquired Pt was boarding in the ED awaiting discharge to SNF for 7 days prior to admission. Severe sepsis evidenced by tachypnea, leukocytosis, lactic acidosis with pulmonary and cardiac organ involvement in setting of acute hypoxic respiratory failure Has completed and biotic course Continue mucolytics, bronchodilators and other supportive care per orders    Acute respiratory failure with hypoxia - due to PNA, pleural effusions, ?CHF --Mgmt of  underlying issues as outlined --O2 per protocol, wean as tolerated, target sats > 90%   Chronic combined systolic and diastolic CHF  History of VT (peri-CABG surgery) Abnormal EKG Elevated Troponins likely Demand Ischemia in setting of sepsis and hypoxia Hx of CAD s/p CABGx5 (Dec 2024) Echo 04/20/2023: EF ~ 20--25%, grade II DD Given Lasix 40 mg IV x 1 on admission BNP elevated 3075 (note baseline BNP appears ~2200+) Cardiology on board we appreciate input --Stopped digoxin due to bradycardia --Continue amiodarone 200 mg daily Continue Lasix 20 mg PO daily. Aldactone 12.5 mg daily, low dose losartan  Continue aspirin, Plavix, Lipitor Monitor input and output Continue daily weight Has completed a course of heparin drip --Stat EKG and repeat troponin if chest pain --Maintain K>4, Mg>2 for hx of VT   Bilateral pleural effusions Continue to monitor input and output --Will consider thoracentesis if needed   Normocytic anemia Monitor for signs of bleeding as well as CBC We will transfuse RBC's if Hbg < 8 cardiac history   Hyperlipidemia Continue Lipitor   CAD - stable S/P CABG x 5 Continue aspirin and Lipitor    Protein-calorie malnutrition, severe Dietitian on board   Subjective:  Patient seen and examined at bedside this morning TOC manager still working on placement He denies any acute overnight events   Physical examination General exam: awake & alert, no acute distress, frail, chronically ill appearing HEENT: moist mucus membranes, hearing grossly normal  Respiratory system: CTAB, no wheezes, normal respiratory effort at rest on room air Cardiovascular system: normal S1/S2, RRR, no  JVD, no pedal edema.   Gastrointestinal system: soft, NT, ND Central nervous system: A&O x self and hospital. no gross focal neurologic deficits, normal speech Extremities: moves all, no edema, normal tone Psychiatry: normal mood, congruent affect   Family Communication: None present,  will attempt to call as time allows   Disposition: Status is: Inpatient Remains inpatient appropriate because: severity of illness as above, on IV therapies, ongoing evaluation    Planned Discharge Destination: Skilled nursing facility     Data Reviewed:        Latest Ref Rng & Units 06/08/2023   10:10 AM 06/07/2023    4:43 AM 06/04/2023    7:19 AM  CBC  WBC 4.0 - 10.5 K/uL 6.6  6.6  6.1   Hemoglobin 13.0 - 17.0 g/dL 16.1  9.5  9.3   Hematocrit 39.0 - 52.0 % 36.2  31.1  30.1   Platelets 150 - 400 K/uL 393  383  322        Latest Ref Rng & Units 06/08/2023   10:10 AM 06/07/2023    4:43 AM 06/04/2023    7:19 AM  BMP  Glucose 70 - 99 mg/dL 73  86  096   BUN 8 - 23 mg/dL 15  16  18    Creatinine 0.61 - 1.24 mg/dL 0.45  4.09  8.11   Sodium 135 - 145 mmol/L 136  135  135   Potassium 3.5 - 5.1 mmol/L 4.2  4.1  3.9   Chloride 98 - 111 mmol/L 101  102  106   CO2 22 - 32 mmol/L 28  26  23    Calcium 8.9 - 10.3 mg/dL 8.9  8.5  8.1       Vitals:   06/08/23 0355 06/08/23 0500 06/08/23 0841 06/08/23 1514  BP: 110/63  111/66 (!) 102/51  Pulse: (!) 56  (!) 55 (!) 59  Resp: 16  16 16   Temp: 97.6 F (36.4 C)  97.9 F (36.6 C) 97.8 F (36.6 C)  TempSrc:   Oral   SpO2: 100%  100% 100%  Weight:  59.8 kg    Height:         Author: Loyce Dys, MD 06/08/2023 4:25 PM  For on call review [email or web address].

## 2023-06-08 NOTE — TOC Progression Note (Signed)
 Transition of Care [facility]) - Progression Note    Patient Details  Name: Walter Vargas MRN: [id number] Date of Birth: January 17, 1946  Transition of [facility]) CM/SW Contact  Marlowe Sax, RN Phone Number: 06/08/2023, 4:10 PM  Clinical Narrative:    Attempted to reach son to inquire about payment to go to STR, left a HIPAA complaint VM asking for a call back   Expected Discharge Plan: Skilled Nursing Facility Barriers to Discharge: Continued Medical Work up  Expected Discharge Plan and Services       Living arrangements for the past 2 months: Single Family Home                                       Social Determinants of Health (SDOH) Interventions SDOH Screenings   Food Insecurity: No Food Insecurity (06/01/2023)  Housing: Low Risk  (06/01/2023)  Transportation Needs: No Transportation Needs (06/01/2023)  Utilities: Not At Risk (06/01/2023)  Depression (PHQ2-9): Low Risk  (10/19/2022)  Social Connections: Socially Isolated (06/01/2023)  Tobacco Use: Medium Risk (06/01/2023)    Readmission Risk Interventions    03/20/2023   12:12 PM  Readmission Risk Prevention Plan  Transportation Screening Complete  Medication Review [employer]) Complete  HRI or Home Care Consult Complete  SW Recovery Care/Counseling Consult Complete  Palliative Care Screening Not Applicable  Skilled Nursing Facility Not Applicable

## 2023-06-08 NOTE — Plan of Care (Signed)
  Problem: Clinical Measurements: Goal: Diagnostic test results will improve Outcome: Progressing   Problem: Health Behavior/Discharge Planning: Goal: Ability to manage health-related needs will improve Outcome: Progressing   Problem: Clinical Measurements: Goal: Diagnostic test results will improve Outcome: Progressing   Problem: Activity: Goal: Risk for activity intolerance will decrease Outcome: Progressing   Problem: Nutrition: Goal: Adequate nutrition will be maintained Outcome: Progressing   Problem: Coping: Goal: Level of anxiety will decrease Outcome: Progressing

## 2023-06-08 NOTE — Care Management Important Message (Signed)
 Important Message  Patient Details  Name: Walter Vargas MRN: [id number] Date of Birth: [birth year]   Important Message Given:  Yes - Medicare IM     Bernadette Hoit 06/08/2023, 10:31 AM

## 2023-06-08 NOTE — Progress Notes (Signed)
 Physical Therapy Treatment Patient Details Name: Walter Vargas MRN: [id number] DOB: 24-Feb-1946 Today's Date: 06/08/2023   History of Present Illness 78 year old male with history of GERD, hyperlipidemia, CAD status post CABG, on Eliquis and amiodarone, carotid enterectomy, who presented to emergency department after a fall.  Patient was in the ED from 05/24/2023 -06/01/23 pending discharge to skilled nursing facility, but was noted to have hypoxic respite failure, desatting to 80% on room air.  Now admitted with pneumonia.    PT Comments  Pt in chair,  Ready to walk and completes x 1 lap on unit with RW and min a x 1 for general navigation and safety.  Overall does well.  Some decreased gait quality as he fatigues.  Need hand over hand assist for cues for hand placements with transitions.   If plan is discharge home, recommend the following: A little help with walking and/or transfers;Assistance with cooking/housework;Direct supervision/assist for medications management;Direct supervision/assist for financial management;Assist for transportation;Help with stairs or ramp for entrance;Supervision due to cognitive status   Can travel by private vehicle        Equipment Recommendations       Recommendations for Other Services       Precautions / Restrictions Precautions Precautions: Fall Recall of Precautions/Restrictions: Impaired Restrictions Weight Bearing Restrictions Per Provider Order: No RUE Weight Bearing Per Provider Order: Weight bearing as tolerated LUE Weight Bearing Per Provider Order: Weight bearing as tolerated Other Position/Activity Restrictions: sternal precautions     Mobility  Bed Mobility               General bed mobility comments: in chair before and after Patient Response: Cooperative  Transfers Overall transfer level: Needs assistance Equipment used: Rolling walker (2 wheels) Transfers: Sit to/from Stand Sit to Stand: Contact guard assist                 Ambulation/Gait Ambulation/Gait assistance: Contact guard assist Gait Distance (Feet): 200 Feet Assistive device: Rolling walker (2 wheels) Gait Pattern/deviations: Step-through pattern Gait velocity: Decreased     General Gait Details: some decreased quality as he fatigues but overall does well.   Stairs             Wheelchair Mobility     Tilt Bed Tilt Bed Patient Response: Cooperative  Modified Rankin (Stroke Patients Only)       Balance Overall balance assessment: Needs assistance Sitting-balance support: Feet supported Sitting balance-Leahy Scale: Good     Standing balance support: Bilateral upper extremity supported, During functional activity Standing balance-Leahy Scale: Fair Standing balance comment: Due to cognition/impusivity/ poor awareness, pt is at risk of falls.                            Communication Communication Communication: Impaired Factors Affecting Communication: Hearing impaired  Cognition Arousal: Alert Behavior During Therapy: WFL for tasks assessed/performed   PT - Cognitive impairments: History of cognitive impairments                         Following commands: Intact Following commands impaired: Follows multi-step commands inconsistently, Follows one step commands with increased time    Cueing Cueing Techniques: Verbal cues  Exercises      General Comments        Pertinent Vitals/Pain Pain Assessment Pain Assessment: No/denies pain    Home Living  Prior Function            PT Goals (current goals can now be found in the care plan section) Progress towards PT goals: Progressing toward goals    Frequency    Min 2X/week      PT Plan      Co-evaluation              AM-PAC PT "6 Clicks" Mobility   Outcome Measure  Help needed turning from your back to your side while in a flat bed without using bedrails?: A Little Help needed  moving from lying on your back to sitting on the side of a flat bed without using bedrails?: A Little Help needed moving to and from a bed to a chair (including a wheelchair)?: A Little Help needed standing up from a chair using your arms (e.g., wheelchair or bedside chair)?: A Little Help needed to walk in hospital room?: A Little Help needed climbing 3-5 steps with a railing? : A Little 6 Click Score: 18    End of Session Equipment Utilized During Treatment: Gait belt Activity Tolerance: Patient tolerated treatment well Patient left: in chair;with call bell/phone within reach;with chair alarm set Nurse Communication: Mobility status PT Visit Diagnosis: Other abnormalities of gait and mobility (R26.89);Difficulty in walking, not elsewhere classified (R26.2);Muscle weakness (generalized) (M62.81);Unsteadiness on feet (R26.81);Repeated falls (R29.6)     Time: 4098-1191 PT Time Calculation (min) (ACUTE ONLY): 10 min  Charges:    $Gait Training: 8-22 mins PT General Charges $$ ACUTE PT VISIT: 1 Visit                   Danielle Dess, PTA 06/08/23, 2:29 PM

## 2023-06-08 NOTE — Plan of Care (Signed)
  Problem: Clinical Measurements: Goal: Signs and symptoms of infection will decrease Outcome: Progressing   Problem: Respiratory: Goal: Ability to maintain adequate ventilation will improve Outcome: Progressing   Problem: Education: Goal: Knowledge of General Education information will improve Description: Including pain rating scale, medication(s)/side effects and non-pharmacologic comfort measures Outcome: Progressing   Problem: Clinical Measurements: Goal: Ability to maintain clinical measurements within normal limits will improve Outcome: Progressing Goal: Will remain free from infection Outcome: Progressing Goal: Diagnostic test results will improve Outcome: Progressing

## 2023-06-08 NOTE — Progress Notes (Signed)
 Occupational Therapy Treatment Patient Details Name: Walter Vargas MRN: [id number] DOB: October 18, 1945 Today's Date: 06/08/2023   History of present illness 78 year old male with history of GERD, hyperlipidemia, CAD status post CABG, on Eliquis and amiodarone, carotid enterectomy, who presented to emergency department after a fall.  Patient was in the ED from 05/24/2023 -06/01/23 pending discharge to skilled nursing facility, but was noted to have hypoxic respite failure, desatting to 80% on room air.  Now admitted with pneumonia.   OT comments  Pt is seated in recliner on arrival. Pleasant and agreeable to OT session. He denies pain. Pill box test performed this session, however with increased difficulty d/t low vision. Pt was able to state how many pills of each would be needed a day/week aloud to therapist after therapist read instructions for each pill bottle aloud. Reviewed education on safety regarding when/why to call the pharmacy with pt cognition seemingly improved compared to prior visits. Edu on possible pre-packaging of pills to maximize ease/safety with carryover/compliance. He appears at CGA/SUP for ADL performance/transfers and walked a lap around the nurses station with PT earlier. Pt left seated in recliner with all needs in place and will cont to require skilled acute OT services to maximize his safety and IND to return to PLOF.       If plan is discharge home, recommend the following:  A little help with walking and/or transfers;A little help with bathing/dressing/bathroom;Assistance with cooking/housework;Assist for transportation;Help with stairs or ramp for entrance;Direct supervision/assist for financial management;Direct supervision/assist for medications management;Supervision due to cognitive status   Equipment Recommendations  Other (comment) (defer)    Recommendations for Other Services      Precautions / Restrictions Precautions Precautions: Fall Recall of  Precautions/Restrictions: Impaired Restrictions Weight Bearing Restrictions Per Provider Order: No RUE Weight Bearing Per Provider Order: Weight bearing as tolerated LUE Weight Bearing Per Provider Order: Weight bearing as tolerated Other Position/Activity Restrictions: sternal precautions       Mobility Bed Mobility               General bed mobility comments: in chair before and after    Transfers                         Balance Overall balance assessment: Needs assistance Sitting-balance support: Feet supported Sitting balance-Leahy Scale: Good                                     ADL either performed or assessed with clinical judgement   ADL                                         General ADL Comments: performed pill box with increased difficulty d/t low vision; able to state how many pills of each would be needed a day/week aloud to therapist; appears at CGA/SUP for ADL performance/transfers    Extremity/Trunk Assessment              Vision       Perception     Praxis     Communication Communication Communication: Impaired Factors Affecting Communication: Hearing impaired   Cognition Arousal: Alert Behavior During Therapy: [facility] for tasks assessed/performed  Following commands: Intact Following commands impaired: Follows multi-step commands inconsistently, Follows one step commands with increased time      Cueing   Cueing Techniques: Verbal cues  Exercises Other Exercises Other Exercises: Reviewed precautions regarding medications and proper protocol for calling pharmacy with any questions/concerns regarding meds. Edu on having pharmacy pre organize his meds into packets if able to maximize ease.    Shoulder Instructions       General Comments      Pertinent Vitals/ Pain       Pain Assessment Pain Assessment: No/denies pain  Home Living                                           Prior Functioning/Environment              Frequency  Min 2X/week        Progress Toward Goals  OT Goals(current goals can now be found in the care plan section)  Progress towards OT goals: Progressing toward goals  Acute Rehab OT Goals Patient Stated Goal: return home OT Goal Formulation: With patient Time For Goal Achievement: 06/20/23 Potential to Achieve Goals: Fair  Plan      Co-evaluation                 AM-PAC OT "6 Clicks" Daily Activity     Outcome Measure   Help from another person eating meals?: None Help from another person taking care of personal grooming?: A Little Help from another person toileting, which includes using toliet, bedpan, or urinal?: A Little Help from another person bathing (including washing, rinsing, drying)?: A Little Help from another person to put on and taking off regular upper body clothing?: None Help from another person to put on and taking off regular lower body clothing?: A Little 6 Click Score: 20    End of Session    OT Visit Diagnosis: Unsteadiness on feet (R26.81);Other abnormalities of gait and mobility (R26.89);Muscle weakness (generalized) (M62.81);Other symptoms and signs involving cognitive function   Activity Tolerance Patient tolerated treatment well   Patient Left in chair;with call bell/phone within reach;with chair alarm set   Nurse Communication          Time: [phone] OT Time Calculation (min): 20 min  Charges: OT General Charges $OT Visit: 1 Visit OT Treatments $Self Care/Home Management : 8-22 mins  Temara Lanum, OTR/L  06/08/23, 4:12 PM   Jermane Brayboy E Danyia Borunda 06/08/2023, 4:09 PM

## 2023-06-09 DIAGNOSIS — J189 Pneumonia, unspecified organism: Secondary | ICD-10-CM | POA: Diagnosis not present

## 2023-06-09 LAB — CBC WITH DIFFERENTIAL/PLATELET
Abs Immature Granulocytes: 0.02 10*3/uL (ref 0.00–0.07)
Basophils Absolute: 0.1 10*3/uL (ref 0.0–0.1)
Basophils Relative: 1 %
Eosinophils Absolute: 0.4 10*3/uL (ref 0.0–0.5)
Eosinophils Relative: 5 %
HCT: 32.8 % — ABNORMAL LOW (ref 39.0–52.0)
Hemoglobin: 10.1 g/dL — ABNORMAL LOW (ref 13.0–17.0)
Immature Granulocytes: 0 %
Lymphocytes Relative: 20 %
Lymphs Abs: 1.3 10*3/uL (ref 0.7–4.0)
MCH: 28.2 pg (ref 26.0–34.0)
MCHC: 30.8 g/dL (ref 30.0–36.0)
MCV: 91.6 fL (ref 80.0–100.0)
Monocytes Absolute: 0.8 10*3/uL (ref 0.1–1.0)
Monocytes Relative: 12 %
Neutro Abs: 4.1 10*3/uL (ref 1.7–7.7)
Neutrophils Relative %: 62 %
Platelets: 378 10*3/uL (ref 150–400)
RBC: 3.58 MIL/uL — ABNORMAL LOW (ref 4.22–5.81)
RDW: 19.9 % — ABNORMAL HIGH (ref 11.5–15.5)
WBC: 6.7 10*3/uL (ref 4.0–10.5)
nRBC: 0 % (ref 0.0–0.2)

## 2023-06-09 LAB — BASIC METABOLIC PANEL WITH GFR
Anion gap: 7 (ref 5–15)
BUN: 22 mg/dL (ref 8–23)
CO2: 27 mmol/L (ref 22–32)
Calcium: 8.8 mg/dL — ABNORMAL LOW (ref 8.9–10.3)
Chloride: 101 mmol/L (ref 98–111)
Creatinine, Ser: 0.66 mg/dL (ref 0.61–1.24)
GFR, Estimated: >60 mL/min (ref >60)
Glucose, Bld: 89 mg/dL (ref 70–99)
Potassium: 4.8 mmol/L (ref 3.5–5.1)
Sodium: 135 mmol/L (ref 135–145)

## 2023-06-09 NOTE — Progress Notes (Signed)
 Mobility Specialist - Progress Note   06/09/23 1048  Mobility  Activity Ambulated with assistance in hallway  Level of Assistance Standby assist, set-up cues, supervision of patient - no hands on  Assistive Device Front wheel walker  Distance Ambulated (ft) 200 ft  Activity Response Tolerated well  Mobility Referral Yes  Mobility visit 1 Mobility  Mobility Specialist Start Time (ACUTE ONLY) 0945  Mobility Specialist Stop Time (ACUTE ONLY) 1000  Mobility Specialist Time Calculation (min) (ACUTE ONLY) 15 min   Pt sitting in recliner on RA upon arrival. Pt STS and ambulates in hallway Supervision without physical assistance. Pt returns to recliner with needs in reach and chair alarm active.   Terrilyn Saver  Mobility Specialist  06/09/23 10:49 AM

## 2023-06-09 NOTE — Progress Notes (Signed)
 Progress Note   Patient: Walter Vargas UJW:[id number] DOB: 1946-02-17 DOA: 05/24/2023     8 DOS: the patient was seen and examined on 06/09/2023    Brief hospital course: HPI on admission 06/01/23: "Walter Vargas is a 78 year old male with history of GERD, hyperlipidemia, CAD status post CABG, on Eliquis and amiodarone, carotid enterectomy, who presents emergency department for chief concerns of falling.   Patient was pending discharge to skilled nursing facility in the ED since 05/24/2023.   On day of admission, patient was noted to have hypoxic respite failure, desatting to 80% on room air.  Patient was placed on 6 L nasal cannula with improvement to 94%.  Patient has been titrated down to 3 L nasal cannula at this time with SpO2 100% on room air.  And during hypoxic episode, patient was noted to be altered and trying to pull out leads and oxygen supplementation.  Patient is currently more redirectable.   Vitals in the ED showed temperature of 98.9, respiration rate of 26, heart rate 94, blood pressure 125/70, SpO2 of 80% on room air, currently improved to 1 high percent on 3 L nasal cannula...." See H&P for full HPI on admission & ED course.   Patient was admitted for further evaluation and management of severe sepsis due  multifocal pneumonia complicated by acute respiratory failure with hypoxia.   Further hospital course and management as outlined below.       Assessment and Plan:     Severe sepsis due to Multifocal Pneumonia, suspect hospital-acquired Pt was boarding in the ED awaiting discharge to SNF for 7 days prior to admission. Severe sepsis evidenced by tachypnea, leukocytosis, lactic acidosis with pulmonary and cardiac organ involvement in setting of acute hypoxic respiratory failure Has completed and biotic course Continue mucolytics, bronchodilators and other supportive care per orders     Acute respiratory failure with hypoxia - due to PNA, pleural effusions, ?CHF --Mgmt  of underlying issues as outlined --O2 per protocol, wean as tolerated, target sats > 90%   Chronic combined systolic and diastolic CHF  History of VT (peri-CABG surgery) Abnormal EKG Elevated Troponins likely Demand Ischemia in setting of sepsis and hypoxia Hx of CAD s/p CABGx5 (Dec 2024) Echo 04/20/2023: EF ~ 20--25%, grade II DD Given Lasix 40 mg IV x 1 on admission BNP elevated 3075 (note baseline BNP appears ~2200+) Cardiology on board we appreciate input --Stopped digoxin due to bradycardia --Continue amiodarone 200 mg daily Continue Lasix 20 mg PO daily. Aldactone 12.5 mg daily, low dose losartan  Continue aspirin, Plavix, Lipitor Monitor input and output Continue daily weight Has completed a course of heparin drip --Stat EKG and repeat troponin if chest pain --Maintain K>4, Mg>2 for hx of VT   Bilateral pleural effusions Continue to monitor input and output --Will consider thoracentesis if needed   Normocytic anemia Monitor for signs of bleeding as well as CBC We will transfuse RBC's if Hbg < 8 cardiac history   Hyperlipidemia Continue Lipitor   CAD - stable S/P CABG x 5 Continue aspirin and Lipitor    Protein-calorie malnutrition, severe Dietitian on board   Subjective:  Patient seen and examined at bedside this morning Still continues to await placement Denies any acute overnight event   Physical examination General exam: awake & alert, no acute distress, frail, chronically ill appearing HEENT: moist mucus membranes, hearing grossly normal  Respiratory system: CTAB, no wheezes, normal respiratory effort at rest on room air Cardiovascular system: normal S1/S2, RRR, no  JVD, no pedal edema.   Gastrointestinal system: soft, NT, ND Central nervous system: A&O x self and hospital. no gross focal neurologic deficits, normal speech Extremities: moves all, no edema, normal tone Psychiatry: normal mood, congruent affect   Family Communication: None present, will  attempt to call as time allows   Disposition: Status is: Inpatient Remains inpatient appropriate because: severity of illness as above, on IV therapies, ongoing evaluation    Planned Discharge Destination: Skilled nursing facility     Data Reviewed:    Vitals:   06/08/23 2029 06/09/23 0532 06/09/23 0809 06/09/23 1510  BP: 110/60 113/60 106/60 (!) 105/52  Pulse: 63 (!) 54 (!) 55 (!) 58  Resp: 16 16 19 17   Temp: 98.2 F (36.8 C) 98.1 F (36.7 C) 97.7 F (36.5 C) 97.7 F (36.5 C)  TempSrc:   Oral Oral  SpO2: 100% 97% 100% 98%  Weight:  58.6 kg    Height:          Latest Ref Rng & Units 06/09/2023    4:28 AM 06/08/2023   10:10 AM 06/07/2023    4:43 AM  CBC  WBC 4.0 - 10.5 K/uL 6.7  6.6  6.6   Hemoglobin 13.0 - 17.0 g/dL 84.1  66.0  9.5   Hematocrit 39.0 - 52.0 % 32.8  36.2  31.1   Platelets 150 - 400 K/uL 378  393  383        Latest Ref Rng & Units 06/09/2023    4:28 AM 06/08/2023   10:10 AM 06/07/2023    4:43 AM  BMP  Glucose 70 - 99 mg/dL 89  73  86   BUN 8 - 23 mg/dL 22  15  16    Creatinine 0.61 - 1.24 mg/dL 6.30  1.60  1.09   Sodium 135 - 145 mmol/L 135  136  135   Potassium 3.5 - 5.1 mmol/L 4.8  4.2  4.1   Chloride 98 - 111 mmol/L 101  101  102   CO2 22 - 32 mmol/L 27  28  26    Calcium 8.9 - 10.3 mg/dL 8.8  8.9  8.5      Author: Loyce Dys, MD 06/09/2023 4:22 PM  For on call review [email or web address].

## 2023-06-10 DIAGNOSIS — J189 Pneumonia, unspecified organism: Secondary | ICD-10-CM | POA: Diagnosis not present

## 2023-06-10 NOTE — Plan of Care (Signed)

## 2023-06-10 NOTE — Progress Notes (Signed)
 Progress Note   Patient: Walter Vargas DOB: 11-22-1945 DOA: 05/24/2023     9 DOS: the patient was seen and examined on 06/10/2023     Brief hospital course: HPI on admission 06/01/23: "Walter Vargas is a 78 year old male with history of GERD, hyperlipidemia, CAD status post CABG, on Eliquis and amiodarone, carotid enterectomy, who presents emergency department for chief concerns of falling.   Patient was pending discharge to skilled nursing facility in the ED since 05/24/2023.   On day of admission, patient was noted to have hypoxic respite failure, desatting to 80% on room air.  Patient was placed on 6 L nasal cannula with improvement to 94%.  Patient has been titrated down to 3 L nasal cannula at this time with SpO2 100% on room air.  And during hypoxic episode, patient was noted to be altered and trying to pull out leads and oxygen supplementation.  Patient is currently more redirectable.   Vitals in the ED showed temperature of 98.9, respiration rate of 26, heart rate 94, blood pressure 125/70, SpO2 of 80% on room air, currently improved to 1 high percent on 3 L nasal cannula...." See H&P for full HPI on admission & ED course.   Patient was admitted for further evaluation and management of severe sepsis due  multifocal pneumonia complicated by acute respiratory failure with hypoxia.   Further hospital course and management as outlined below.       Assessment and Plan:     Severe sepsis due to Multifocal Pneumonia, suspect hospital-acquired Pt was boarding in the ED awaiting discharge to SNF for 7 days prior to admission. Severe sepsis evidenced by tachypnea, leukocytosis, lactic acidosis with pulmonary and cardiac organ involvement in setting of acute hypoxic respiratory failure Has completed and biotic course Continue mucolytics, bronchodilators and other supportive care per orders     Acute respiratory failure with hypoxia - due to PNA, pleural effusions,  ?CHF --Mgmt of underlying issues as outlined --O2 per protocol, wean as tolerated, target sats > 90%   Chronic combined systolic and diastolic CHF  History of VT (peri-CABG surgery) Abnormal EKG Elevated Troponins likely Demand Ischemia in setting of sepsis and hypoxia Hx of CAD s/p CABGx5 (Dec 2024) Echo 04/20/2023: EF ~ 20--25%, grade II DD Given Lasix 40 mg IV x 1 on admission BNP elevated 3075 (note baseline BNP appears ~2200+) Cardiology on board we appreciate input Continue amiodarone amiodarone 200 mg daily Continue Lasix 20 mg PO daily. Aldactone 12.5 mg daily, low dose losartan  Continue aspirin, Plavix, Lipitor Monitor input and output Continue daily weight Has completed a course of heparin drip    Bilateral pleural effusions Continue to monitor input and output --Will consider thoracentesis if needed   Normocytic anemia Monitor for signs of bleeding as well as CBC We will transfuse RBC's if Hbg < 8 cardiac history   Hyperlipidemia Continue Lipitor   CAD - stable S/P CABG x 5 Continue aspirin and Lipitor    Protein-calorie malnutrition, severe Dietitian on board   Subjective:  Patient seen and examined at bedside this morning Denies any acute overnight event TOC still working on placement    Physical examination General exam: awake & alert, no acute distress, frail, chronically ill appearing HEENT: moist mucus membranes, hearing grossly normal  Respiratory system: CTAB, no wheezes, normal respiratory effort at rest on room air Cardiovascular system: normal S1/S2, RRR, no JVD, no pedal edema.   Gastrointestinal system: soft, NT, ND Central nervous system: A&O x  self and hospital. no gross focal neurologic deficits, normal speech Extremities: moves all, no edema, normal tone Psychiatry: normal mood, congruent affect   Family Communication: None present, will attempt to call as time allows   Disposition: Status is: Inpatient Remains inpatient  appropriate because: severity of illness as above, on IV therapies, ongoing evaluation    Planned Discharge Destination: Skilled nursing facility     Data Reviewed:      Vitals:   06/09/23 1959 06/10/23 0422 06/10/23 0952 06/10/23 1558  BP: (!) 107/51 107/65 (!) 114/50 101/60  Pulse: 65 (!) 59 (!) 59 62  Resp: 18 18  17   Temp: 98 F (36.7 C) 98.2 F (36.8 C) 98.7 F (37.1 C) 98 F (36.7 C)  TempSrc: Oral Oral  Oral  SpO2: 98% 98%  100%  Weight:      Height:          Latest Ref Rng & Units 06/09/2023    4:28 AM 06/08/2023   10:10 AM 06/07/2023    4:43 AM  CBC  WBC 4.0 - 10.5 K/uL 6.7  6.6  6.6   Hemoglobin 13.0 - 17.0 g/dL 60.4  54.0  9.5   Hematocrit 39.0 - 52.0 % 32.8  36.2  31.1   Platelets 150 - 400 K/uL 378  393  383        Latest Ref Rng & Units 06/09/2023    4:28 AM 06/08/2023   10:10 AM 06/07/2023    4:43 AM  BMP  Glucose 70 - 99 mg/dL 89  73  86   BUN 8 - 23 mg/dL 22  15  16    Creatinine 0.61 - 1.24 mg/dL 9.81  1.91  4.78   Sodium 135 - 145 mmol/L 135  136  135   Potassium 3.5 - 5.1 mmol/L 4.8  4.2  4.1   Chloride 98 - 111 mmol/L 101  101  102   CO2 22 - 32 mmol/L 27  28  26    Calcium 8.9 - 10.3 mg/dL 8.8  8.9  8.5      Author: Loyce Dys, MD 06/10/2023 5:06 PM  For on call review [email or web address].

## 2023-06-10 NOTE — Progress Notes (Signed)
 Mobility Specialist - Progress Note   06/10/23 0910  Mobility  Activity Ambulated with assistance in hallway;Stood at bedside;Dangled on edge of bed  Level of Assistance Standby assist, set-up cues, supervision of patient - no hands on  Assistive Device Front wheel walker  Distance Ambulated (ft) 200 ft  Activity Response Tolerated well  Mobility Referral Yes  Mobility visit 1 Mobility  Mobility Specialist Start Time (ACUTE ONLY) [id number]  Mobility Specialist Stop Time (ACUTE ONLY) 0829  Mobility Specialist Time Calculation (min) (ACUTE ONLY) 18 min   Pt side lying in bed on RA upon arrival. Pt completes bed mobility indep. Pt STS with MinA. Pt ambulates in hallway SBA.Pt left in recliner with needs in reach and chair alarm activated.   Terrilyn Saver  Mobility Specialist  06/10/23 9:12 AM

## 2023-06-11 DIAGNOSIS — J189 Pneumonia, unspecified organism: Secondary | ICD-10-CM | POA: Diagnosis not present

## 2023-06-11 MED ORDER — TRAMADOL HCL 50 MG PO TABS
50.0000 mg | ORAL_TABLET | Freq: Four times a day (QID) | ORAL | Status: DC | PRN
  Administered 2023-06-11 – 2023-06-15 (×5): 50 mg via ORAL
  Filled 2023-06-11 (×5): qty 1

## 2023-06-11 NOTE — Plan of Care (Signed)
  Problem: Fluid Volume: Goal: Hemodynamic stability will improve Outcome: Progressing   Problem: Clinical Measurements: Goal: Diagnostic test results will improve Outcome: Progressing Goal: Signs and symptoms of infection will decrease Outcome: Progressing   Problem: Respiratory: Goal: Ability to maintain adequate ventilation will improve Outcome: Progressing   Problem: Education: Goal: Knowledge of General Education information will improve Description: Including pain rating scale, medication(s)/side effects and non-pharmacologic comfort measures Outcome: Not Progressing   Problem: Health Behavior/Discharge Planning: Goal: Ability to manage health-related needs will improve Outcome: Progressing   Problem: Clinical Measurements: Goal: Ability to maintain clinical measurements within normal limits will improve Outcome: Progressing Goal: Will remain free from infection Outcome: Progressing Goal: Diagnostic test results will improve Outcome: Progressing Goal: Respiratory complications will improve Outcome: Progressing Goal: Cardiovascular complication will be avoided Outcome: Progressing   Problem: Activity: Goal: Risk for activity intolerance will decrease Outcome: Progressing   Problem: Nutrition: Goal: Adequate nutrition will be maintained Outcome: Progressing   Problem: Coping: Goal: Level of anxiety will decrease Outcome: Progressing   Problem: Elimination: Goal: Will not experience complications related to bowel motility Outcome: Progressing Goal: Will not experience complications related to urinary retention Outcome: Progressing   Problem: Pain Managment: Goal: General experience of comfort will improve and/or be controlled Outcome: Progressing   Problem: Safety: Goal: Ability to remain free from injury will improve Outcome: Progressing   Problem: Skin Integrity: Goal: Risk for impaired skin integrity will decrease Outcome: Progressing

## 2023-06-11 NOTE — Progress Notes (Signed)
 Progress Note   Patient: Walter Vargas:[id number] DOB: [birth year] DOA: 05/24/2023     10 DOS: the patient was seen and examined on 06/11/2023     Brief hospital course: HPI on admission 06/01/23: "Notnamed Scholz is a 78 year old male with history of GERD, hyperlipidemia, CAD status post CABG, on Eliquis and amiodarone, carotid enterectomy, who presents emergency department for chief concerns of falling.   Patient was pending discharge to skilled nursing facility in the ED since 05/24/2023.   On day of admission, patient was noted to have hypoxic respite failure, desatting to 80% on room air.  Patient was placed on 6 L nasal cannula with improvement to 94%.  Patient has been titrated down to 3 L nasal cannula at this time with SpO2 100% on room air.  And during hypoxic episode, patient was noted to be altered and trying to pull out leads and oxygen supplementation.  Patient is currently more redirectable.   Vitals in the ED showed temperature of 98.9, respiration rate of 26, heart rate 94, blood pressure 125/70, SpO2 of 80% on room air, currently improved to 1 high percent on 3 L nasal cannula...." See H&P for full HPI on admission & ED course.   Patient was admitted for further evaluation and management of severe sepsis due  multifocal pneumonia complicated by acute respiratory failure with hypoxia.   Further hospital course and management as outlined below.       Assessment and Plan:     Severe sepsis due to Multifocal Pneumonia, suspect hospital-acquired Pt was boarding in the ED awaiting discharge to SNF for 7 days prior to admission. Severe sepsis evidenced by tachypnea, leukocytosis, lactic acidosis with pulmonary and cardiac organ involvement in setting of acute hypoxic respiratory failure Has completed antibiotic course Continue mucolytics, bronchodilators and other supportive care per orders     Acute respiratory failure with hypoxia - due to PNA, pleural effusions,  ?CHF --Mgmt of underlying issues as outlined --O2 per protocol, wean as tolerated, target sats > 90%   Chronic combined systolic and diastolic CHF  History of VT (peri-CABG surgery) Abnormal EKG Elevated Troponins likely Demand Ischemia in setting of sepsis and hypoxia Hx of CAD s/p CABGx5 (Dec 2024) Echo 04/20/2023: EF ~ 20--25%, grade II DD Given Lasix 40 mg IV x 1 on admission BNP elevated 3075 (note baseline BNP appears ~2200+) Cardiology was on board but have since signed off Continue amiodarone amiodarone 200 mg daily Continue Lasix 20 mg PO daily, low dose losartan  Continue aspirin, Plavix, Lipitor Monitor input and output Continue daily weight Has completed a course of heparin drip     Bilateral pleural effusions Continue to monitor input and output --Will consider thoracentesis if needed   Normocytic anemia Monitor for signs of bleeding as well as CBC To transfuse l transfuse RBC's if Hbg < 8 cardiac history   Hyperlipidemia Continue Lipitor   CAD - stable S/P CABG x 5 Continue aspirin and Lipitor    Protein-calorie malnutrition, severe Dietitian on board   Subjective:  Patient seen and examined at bedside this morning Denies any acute overnight event Transition of care manager working on placement     Physical examination General exam: awake & alert, no acute distress, frail, chronically ill appearing HEENT: moist mucus membranes, hearing grossly normal  Respiratory system: CTAB, no wheezes, normal respiratory effort at rest on room air Cardiovascular system: normal S1/S2, RRR, no JVD, no pedal edema.   Gastrointestinal system: soft, NT, ND Central nervous  system: A&O x self and hospital. no gross focal neurologic deficits, normal speech Extremities: moves all, no edema, normal tone Psychiatry: normal mood, congruent affect   Family Communication: None present, will attempt to call as time allows   Disposition: Status is: Inpatient Remains inpatient  appropriate because: severity of illness as above, on IV therapies, ongoing evaluation    Planned Discharge Destination: Skilled nursing facility     Data Reviewed:        Latest Ref Rng & Units 06/09/2023    4:28 AM 06/08/2023   10:10 AM 06/07/2023    4:43 AM  CBC  WBC 4.0 - 10.5 K/uL 6.7  6.6  6.6   Hemoglobin 13.0 - 17.0 g/dL 82.9  56.2  9.5   Hematocrit 39.0 - 52.0 % 32.8  36.2  31.1   Platelets 150 - 400 K/uL 378  393  383        Latest Ref Rng & Units 06/09/2023    4:28 AM 06/08/2023   10:10 AM 06/07/2023    4:43 AM  BMP  Glucose 70 - 99 mg/dL 89  73  86   BUN 8 - 23 mg/dL 22  15  16    Creatinine 0.61 - 1.24 mg/dL 1.30  8.65  7.84   Sodium 135 - 145 mmol/L 135  136  135   Potassium 3.5 - 5.1 mmol/L 4.8  4.2  4.1   Chloride 98 - 111 mmol/L 101  101  102   CO2 22 - 32 mmol/L 27  28  26    Calcium 8.9 - 10.3 mg/dL 8.8  8.9  8.5       Vitals:   06/11/23 0321 06/11/23 0401 06/11/23 0806 06/11/23 1615  BP: 101/62  (!) 101/57 (!) 97/59  Pulse: (!) 56  (!) 54 (!) 56  Resp: 18  20 18   Temp: 97.7 F (36.5 C)  97.7 F (36.5 C) 98 F (36.7 C)  TempSrc: Oral  Oral Oral  SpO2: 100%  100% 100%  Weight:  60.4 kg    Height:        Author: Loyce Dys, MD 06/11/2023 6:34 PM  For on call review [email or web address].

## 2023-06-11 NOTE — Progress Notes (Signed)
 Physical Therapy Treatment Patient Details Name: Walter Vargas MRN: [id number] DOB: 1945/12/25 Today's Date: 06/11/2023   History of Present Illness 78 year old male with history of GERD, hyperlipidemia, CAD status post CABG, on Eliquis and amiodarone, carotid enterectomy, who presented to emergency department after a fall.  Patient was in the ED from 05/24/2023 -06/01/23 pending discharge to skilled nursing facility, but was noted to have hypoxic respite failure, desatting to 80% on room air.  Now admitted with pneumonia.    PT Comments  Pt was pleasant and motivated to participate during the session and put forth good effort throughout. Pt required cuing for improved trunk flexion to assist with coming to standing along with min A.  Pt presented with min instability in standing including drifting during gait and increased sway during balance training but was able to self-correct without physical assist. Pt reported no adverse symptoms during the session with SpO2 and HR WNL throughout on room air including SpO2 that remained 98-99% even after amb.  Pt will benefit from continued PT services upon discharge to safely address deficits listed in patient problem list for decreased caregiver assistance and eventual return to PLOF.       If plan is discharge home, recommend the following: A little help with walking and/or transfers;Assistance with cooking/housework;Direct supervision/assist for medications management;Direct supervision/assist for financial management;Assist for transportation;Help with stairs or ramp for entrance;Supervision due to cognitive status   Can travel by private vehicle     Yes  Equipment Recommendations  Other (comment) (TBD at next venue of care)    Recommendations for Other Services       Precautions / Restrictions Precautions Precautions: Fall Recall of Precautions/Restrictions: Impaired Precaution/Restrictions Comments: mildly impulsive Restrictions Weight  Bearing Restrictions Per Provider Order: Yes RUE Weight Bearing Per Provider Order: Weight bearing as tolerated LUE Weight Bearing Per Provider Order: Weight bearing as tolerated Other Position/Activity Restrictions: sternal precautions     Mobility  Bed Mobility               General bed mobility comments: NT, in recliner    Transfers Overall transfer level: Needs assistance Equipment used: Rolling walker (2 wheels) Transfers: Sit to/from Stand Sit to Stand: Min assist           General transfer comment: Mod verbal cues for increased trunk flexion    Ambulation/Gait Ambulation/Gait assistance: Contact guard assist Gait Distance (Feet): 150 Feet x 1, 75 Feet x 1 Assistive device: Rolling walker (2 wheels) Gait Pattern/deviations: Step-through pattern, Decreased step length - right, Decreased step length - left Gait velocity: Decreased     General Gait Details: Slow cadence with mod verbal and tactile cues for amb closer to the RW with upright posture   Stairs             Wheelchair Mobility     Tilt Bed    Modified Rankin (Stroke Patients Only)       Balance Overall balance assessment: Needs assistance Sitting-balance support: Feet supported Sitting balance-Leahy Scale: Good     Standing balance support: Bilateral upper extremity supported, During functional activity Standing balance-Leahy Scale: Fair                              Hotel manager: Impaired  Cognition Arousal: Alert Behavior During Therapy: WFL for tasks assessed/performed   PT - Cognitive impairments: History of cognitive impairments   Orientation impairments: Time, Situation  Following commands: Intact Following commands impaired: Follows one step commands with increased time    Cueing Cueing Techniques: Verbal cues, Tactile cues  Exercises Other Exercises Other Exercises: Dynamic standing balance  training with reaching outside BOS with feet apart and together    General Comments        Pertinent Vitals/Pain Pain Assessment Pain Assessment: No/denies pain    Home Living                          Prior Function            PT Goals (current goals can now be found in the care plan section) Progress towards PT goals: Progressing toward goals    Frequency    Min 2X/week      PT Plan      Co-evaluation              AM-PAC PT "6 Clicks" Mobility   Outcome Measure  Help needed turning from your back to your side while in a flat bed without using bedrails?: A Little Help needed moving from lying on your back to sitting on the side of a flat bed without using bedrails?: A Little Help needed moving to and from a bed to a chair (including a wheelchair)?: A Little Help needed standing up from a chair using your arms (e.g., wheelchair or bedside chair)?: A Little Help needed to walk in hospital room?: A Little Help needed climbing 3-5 steps with a railing? : A Little 6 Click Score: 18    End of Session Equipment Utilized During Treatment: Gait belt Activity Tolerance: Patient tolerated treatment well Patient left: in chair;with call bell/phone within reach;with chair alarm set Nurse Communication: Mobility status PT Visit Diagnosis: Other abnormalities of gait and mobility (R26.89);Difficulty in walking, not elsewhere classified (R26.2);Muscle weakness (generalized) (M62.81);Unsteadiness on feet (R26.81);Repeated falls (R29.6)     Time: 0272-5366 PT Time Calculation (min) (ACUTE ONLY): 21 min  Charges:    $Gait Training: 8-22 mins PT General Charges $$ ACUTE PT VISIT: 1 Visit                    D. Scott Jadarius Commons PT, DPT 06/11/23, 1:51 PM

## 2023-06-11 NOTE — Plan of Care (Signed)
  Problem: Respiratory: Goal: Ability to maintain adequate ventilation will improve Outcome: Progressing   Problem: Clinical Measurements: Goal: Ability to maintain clinical measurements within normal limits will improve Outcome: Progressing   Problem: Activity: Goal: Risk for activity intolerance will decrease Outcome: Progressing   Problem: Nutrition: Goal: Adequate nutrition will be maintained Outcome: Progressing

## 2023-06-11 NOTE — Plan of Care (Signed)

## 2023-06-12 ENCOUNTER — Encounter: Payer: 59 | Admitting: Vascular Surgery

## 2023-06-12 ENCOUNTER — Ambulatory Visit (HOSPITAL_COMMUNITY): Payer: 59

## 2023-06-12 DIAGNOSIS — J189 Pneumonia, unspecified organism: Secondary | ICD-10-CM | POA: Diagnosis not present

## 2023-06-12 NOTE — TOC Progression Note (Signed)
 Transition of Care [facility]) - Progression Note    Patient Details  Name: Walter Vargas MRN: [id number] Date of Birth: 1945-09-10  Transition of Care [facility]) CM/SW Contact  Marlowe Sax, RN Phone Number: 06/12/2023, 10:49 AM  Clinical Narrative:    [name] the patient's son at [phone], left a HIPAA complaint VM asking for a call back called daughter [name] at [phone] left a HIPAA compliant VM asking for a call back     Expected Discharge Plan: Skilled Nursing Facility Barriers to Discharge: Continued Medical Work up  Expected Discharge Plan and Services       Living arrangements for the past 2 months: Single Family Home                                       Social Determinants of Health (SDOH) Interventions SDOH Screenings   Food Insecurity: No Food Insecurity (06/01/2023)  Housing: Low Risk  (06/01/2023)  Transportation Needs: No Transportation Needs (06/01/2023)  Utilities: Not At Risk (06/01/2023)  Depression (PHQ2-9): Low Risk  (10/19/2022)  Social Connections: Socially Isolated (06/01/2023)  Tobacco Use: Medium Risk (06/01/2023)    Readmission Risk Interventions    03/20/2023   12:12 PM  Readmission Risk Prevention Plan  Transportation Screening Complete  Medication Review Oceanographer) Complete  HRI or Home Care Consult Complete  SW Recovery Care/Counseling Consult Complete  Palliative Care Screening Not Applicable  Skilled Nursing Facility Not Applicable

## 2023-06-12 NOTE — NC FL2 (Addendum)
 [place] MEDICAID FL2 LEVEL OF CARE FORM     IDENTIFICATION  Patient Name: Walter Vargas Birthdate: 02-21-1946 Sex: male Admission Date (Current Location): 05/24/2023  [facility] and [place] Number:  Chiropodist and Address:  [facility], [address] [place], [place] [address]      Provider Number: [id number]  Attending Physician Name and Address:  Loyce Dys, MD  Relative Name and Phone Number:  Remi Deter [name]) Cherrie Gauze  Emergency Contact  [phone]    Current Level of Care: Hospital Recommended Level of Care: ALF Prior Approval Number:    Date Approved/Denied:   PASRR Number: [id number]  Discharge Plan: ALF    Current Diagnoses: Patient Active Problem List   Diagnosis Date Noted   Fall 06/06/2023   Acute respiratory failure with hypoxia (HCC) 06/06/2023   Pelvic pain 06/06/2023   Ischemic cardiomyopathy 06/03/2023   Pneumonia 06/01/2023   Essential hypertension 06/01/2023   CAD (coronary artery disease) 06/01/2023   Severe sepsis (HCC) 06/01/2023   VT (ventricular tachycardia) (HCC) 06/01/2023   Hyperlipidemia 06/01/2023   Elevated troponin 06/01/2023   Status post carotid endarterectomy 04/25/2023   Acute ischemic left middle cerebral artery (MCA) stroke (HCC) 04/19/2023   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, without accompanying behavioral or psychological disturbance (HCC) 04/04/2023   Pressure injury of skin 03/20/2023   Debility 03/20/2023   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   Postoperative shock, cardiogenic (HCC) 03/09/2023   Protein-calorie malnutrition, severe 03/03/2023   Cardiogenic shock (HCC) 03/01/2023   COVID-19 virus infection 02/18/2023   Chronic combined systolic and diastolic CHF (congestive heart failure) (HCC) 02/14/2023   AKI (acute kidney injury) (HCC) 02/13/2023   Acute exacerbation of CHF (congestive heart failure) (HCC) 02/10/2023   Non-ST elevation (NSTEMI) myocardial  infarction (HCC) 02/10/2023   Thickening of wall of gallbladder 02/10/2023   Elevated LFTs 02/10/2023   Pleural effusion 10/19/2022   Elevated brain natriuretic peptide (BNP) level 10/19/2022   Dyspnea on exertion 10/12/2022   Lower extremity edema 10/12/2022   Elevated blood pressure reading 06/21/2022   Callus 06/21/2022   Umbilical hernia without obstruction and without gangrene 06/21/2022   Impacted cerumen of right ear 05/26/2021   Chronic pain of both knees 05/26/2021   Preventative health care 05/26/2021   Screening for colon cancer 05/26/2021    Orientation RESPIRATION BLADDER Height & Weight     Self, Place  Normal Continent Weight: 63.6 kg Height:  5\' 8"  (172.7 cm)  BEHAVIORAL SYMPTOMS/MOOD NEUROLOGICAL BOWEL NUTRITION STATUS  Other (Comment) (None)  (None) Continent Diet (low sodium)  AMBULATORY STATUS COMMUNICATION OF NEEDS Skin   Supervision Verbally Normal   PU Stage 2 Dressing: Daily (Coccyx: Foam daily per documentation from February admission.)                   Personal Care Assistance Level of Assistance  Bathing, Feeding, Dressing Bathing Assistance: Limited assistance Feeding assistance: Limited assistance Dressing Assistance: Limited assistance     Functional Limitations Info  Sight, Hearing, Speech Sight Info: Adequate Hearing Info: Adequate Speech Info: Adequate    SPECIAL CARE FACTORS FREQUENCY  PT (By licensed PT), OT (By licensed OT)     PT Frequency: 5 x week OT Frequency: 5 x week            Contractures Contractures Info: Not present    Additional Factors Info  Code Status, Allergies Code Status Info: Full code Allergies Info: NKDA  Current Medications (06/12/2023):  This is the current hospital active medication list Current Facility-Administered Medications  Medication Dose Route Frequency Provider Last Rate Last Admin   amiodarone (PACERONE) tablet 100 mg  100 mg Oral Daily Rosezetta Schlatter T, MD   100 mg at  06/12/23 1610   ascorbic acid (VITAMIN C) tablet 500 mg  500 mg Oral BID Esaw Grandchild A, DO   500 mg at 06/12/23 9604   aspirin chewable tablet 81 mg  81 mg Oral Daily Cox, Amy N, DO   81 mg at 06/12/23 0949   dapagliflozin propanediol (FARXIGA) tablet 10 mg  10 mg Oral Daily Lorine Bears A, MD   10 mg at 06/12/23 0953   feeding supplement (ENSURE ENLIVE / ENSURE PLUS) liquid 237 mL  237 mL Oral BID BM Esaw Grandchild A, DO   237 mL at 06/10/23 1314   furosemide (LASIX) tablet 20 mg  20 mg Oral Daily Agbor-Etang, Arlys John, MD   20 mg at 06/12/23 0949   guaiFENesin-dextromethorphan (ROBITUSSIN DM) 100-10 MG/5ML syrup 5 mL  5 mL Oral Q4H PRN Rosezetta Schlatter T, MD   5 mL at 06/10/23 0919   hydrOXYzine (ATARAX) tablet 25 mg  25 mg Oral TID PRN Cox, Amy N, DO   25 mg at 06/11/23 2146   losartan (COZAAR) tablet 25 mg  25 mg Oral Daily Lorine Bears A, MD   25 mg at 06/12/23 5409   multivitamin with minerals tablet 1 tablet  1 tablet Oral Daily Esaw Grandchild A, DO   1 tablet at 06/12/23 0949   pantoprazole (PROTONIX) EC tablet 40 mg  40 mg Oral BID AC Cox, Amy N, DO   40 mg at 06/12/23 0949   polyethylene glycol (MIRALAX / GLYCOLAX) packet 17 g  17 g Oral Daily Esaw Grandchild A, DO   17 g at 06/12/23 8119   senna-docusate (Senokot-S) tablet 1 tablet  1 tablet Oral BID Esaw Grandchild A, DO   1 tablet at 06/12/23 0959   traMADol (ULTRAM) tablet 50 mg  50 mg Oral Q6H PRN Loyce Dys, MD   50 mg at 06/11/23 2146   zinc sulfate (50mg  elemental zinc) capsule 220 mg  220 mg Oral Daily Esaw Grandchild A, DO   220 mg at 06/12/23 1478     Discharge Medications: Please see discharge summary for a list of discharge medications.  Relevant Imaging Results:  Relevant Lab Results:   Additional Information SS#: [id number]. looing for private pay memory care  Marlowe Sax, RN

## 2023-06-12 NOTE — Progress Notes (Signed)
 Progress Note   Patient: Walter Vargas KGM:[id number] DOB: 1946-02-15 DOA: 05/24/2023     11 DOS: the patient was seen and examined on 06/12/2023    Brief hospital course: HPI on admission 06/01/23: "Walter Vargas is a 78 year old male with history of GERD, hyperlipidemia, CAD status post CABG, on Eliquis and amiodarone, carotid enterectomy, who presents emergency department for chief concerns of falling.   Patient was pending discharge to skilled nursing facility in the ED since 05/24/2023.   On day of admission, patient was noted to have hypoxic respite failure, desatting to 80% on room air.  Patient was placed on 6 L nasal cannula with improvement to 94%.  Patient has been titrated down to 3 L nasal cannula at this time with SpO2 100% on room air.  And during hypoxic episode, patient was noted to be altered and trying to pull out leads and oxygen supplementation.  Patient is currently more redirectable.   Vitals in the ED showed temperature of 98.9, respiration rate of 26, heart rate 94, blood pressure 125/70, SpO2 of 80% on room air, currently improved to 1 high percent on 3 L nasal cannula...." See H&P for full HPI on admission & ED course.   Patient was admitted for further evaluation and management of severe sepsis due  multifocal pneumonia complicated by acute respiratory failure with hypoxia.   Further hospital course and management as outlined below.       Assessment and Plan:     Severe sepsis due to Multifocal Pneumonia, suspect hospital-acquired Pt was boarding in the ED awaiting discharge to SNF for 7 days prior to admission. Severe sepsis evidenced by tachypnea, leukocytosis, lactic acidosis with pulmonary and cardiac organ involvement in setting of acute hypoxic respiratory failure Has completed antibiotic course Continue mucolytics, bronchodilators and other supportive care per orders     Acute respiratory failure with hypoxia - due to PNA, pleural effusions, ?CHF --Mgmt  of underlying issues as outlined --O2 per protocol, wean as tolerated, target sats > 90%   Chronic combined systolic and diastolic CHF  History of VT (peri-CABG surgery) Abnormal EKG Elevated Troponins likely Demand Ischemia in setting of sepsis and hypoxia Hx of CAD s/p CABGx5 (Dec 2024) Echo 04/20/2023: EF ~ 20--25%, grade II DD Given Lasix 40 mg IV x 1 on admission BNP elevated 3075 (note baseline BNP appears ~2200+) Cardiology was on board but have since signed off Continue amiodarone amiodarone 200 mg daily Continue Lasix 20 mg PO daily, low dose losartan  Continue aspirin, Plavix, Lipitor Monitor input and output Continue to monitor daily weight Has completed a course of heparin drip     Bilateral pleural effusions Continue to monitor input and output --Will consider thoracentesis if needed   Normocytic anemia Monitor for signs of bleeding as well as CBC To transfuse l transfuse RBC's if Hbg < 8 cardiac history   Hyperlipidemia Continue Lipitor   CAD - stable S/P CABG x 5 Continue aspirin and Lipitor    Protein-calorie malnutrition, severe Dietitian on board   Subjective:  Patient seen and examined at bedside this morning Patient denies nausea vomiting abdominal pain chest pain cough Transition of care manager still working on placement options     Physical examination General exam: awake & alert, no acute distress, frail, chronically ill appearing HEENT: moist mucus membranes, hearing grossly normal  Respiratory system: CTAB, no wheezes, normal respiratory effort at rest on room air Cardiovascular system: normal S1/S2, RRR, no JVD, no pedal edema.  Gastrointestinal system: soft, NT, ND Central nervous system: A&O x self and hospital. no gross focal neurologic deficits, normal speech Extremities: moves all, no edema, normal tone Psychiatry: normal mood, congruent affect   Family Communication: None present, will attempt to call as time allows    Disposition: Status is: Inpatient Remains inpatient appropriate because: severity of illness as above, on IV therapies, ongoing evaluation    Planned Discharge Destination: Skilled nursing facility     Data Reviewed:   Vitals:   06/12/23 0500 06/12/23 0750 06/12/23 0805 06/12/23 1631  BP:  116/64  106/73  Pulse:  (!) 59 (!) 57 (!) 52  Resp:  16  16  Temp:  97.9 F (36.6 C)  97.7 F (36.5 C)  TempSrc:  Axillary    SpO2:  97%  100%  Weight: 63.6 kg     Height:          Latest Ref Rng & Units 06/09/2023    4:28 AM 06/08/2023   10:10 AM 06/07/2023    4:43 AM  CBC  WBC 4.0 - 10.5 K/uL 6.7  6.6  6.6   Hemoglobin 13.0 - 17.0 g/dL 19.5  09.3  9.5   Hematocrit 39.0 - 52.0 % 32.8  36.2  31.1   Platelets 150 - 400 K/uL 378  393  383      Author: Loyce Dys, MD 06/12/2023 6:18 PM  For on call review [email or web address].

## 2023-06-12 NOTE — Plan of Care (Signed)
   Problem: Clinical Measurements: Goal: Diagnostic test results will improve Outcome: Progressing   Problem: Activity: Goal: Risk for activity intolerance will decrease Outcome: Progressing   Problem: Nutrition: Goal: Adequate nutrition will be maintained Outcome: Progressing

## 2023-06-12 NOTE — TOC Progression Note (Signed)
 Transition of Care [facility]) - Progression Note    Patient Details  Name: OSMOND STECKMAN MRN: [id number] Date of Birth: 1945-07-12  Transition [facility]) CM/SW Contact  Marlowe Sax, RN Phone Number: 06/12/2023, 3:47 PM  Clinical Narrative:     [name] the son, called me back and stated that he is prepared to pay out of pocket whet insurance does not cover he state that he has not seen the patient I explained that the patient will likely need to have long term care custodial care not Short term rehab, he is walking well with contact guard, he does however need some supervision and minor assistance  , I asked if he would qualify for Medicaid, he stated that the brother died and left him money that makes him not qualify for medicaid I explained that going to short term rehab he will still need supervision at home upon dc such as PCS services I explained that [facility] has not offered a long term bed and STR is a vast difference and I dont know if [facility] has a long term bed to offer.   I agreed to reach out to [facility] to see if they have a long term bed I asked what the back up plan would be if they do not,   He would like to reach out to [facility] since he has talked to her before and see if she can help find a Memory care unit self pay  He will call me back tomorrow with what they want to do   Expected Discharge Plan: Skilled Nursing Facility Barriers to Discharge: Continued Medical Work up  Expected Discharge Plan and Services       Living arrangements for the past 2 months: Single Family Home                                       Social Determinants of Health (SDOH) Interventions SDOH Screenings   Food Insecurity: No Food Insecurity (06/01/2023)  Housing: Low Risk  (06/01/2023)  Transportation Needs: No Transportation Needs (06/01/2023)  Utilities: Not At Risk (06/01/2023)  Depression (PHQ2-9): Low Risk  (10/19/2022)  Social  Connections: Socially Isolated (06/01/2023)  Tobacco Use: Medium Risk (06/01/2023)    Readmission Risk Interventions    03/20/2023   12:12 PM  Readmission Risk Prevention Plan  Transportation Screening Complete  Medication Review Oceanographer) Complete  HRI or Home Care Consult Complete  SW Recovery Care/Counseling Consult Complete  Palliative Care Screening Not Applicable  Skilled Nursing Facility Not Applicable

## 2023-06-12 NOTE — TOC Progression Note (Signed)
 Transition of Care [facility]) - Progression Note    Patient Details  Name: Walter Vargas MRN: [id number] Date of Birth: 24-Aug-1945  Transition of Care [facility]) CM/SW Contact  Marlowe Sax, RN Phone Number: 06/12/2023, 2:51 PM  Clinical Narrative:     Received ac  all from DSS Shanda Bumps attempted to call back at [phone] left a secure vm  Expected Discharge Plan: Skilled Nursing Facility Barriers to Discharge: Continued Medical Work up  Expected Discharge Plan and Services       Living arrangements for the past 2 months: Single Family Home                                       Social Determinants of Health (SDOH) Interventions SDOH Screenings   Food Insecurity: No Food Insecurity (06/01/2023)  Housing: Low Risk  (06/01/2023)  Transportation Needs: No Transportation Needs (06/01/2023)  Utilities: Not At Risk (06/01/2023)  Depression (PHQ2-9): Low Risk  (10/19/2022)  Social Connections: Socially Isolated (06/01/2023)  Tobacco Use: Medium Risk (06/01/2023)    Readmission Risk Interventions    03/20/2023   12:12 PM  Readmission Risk Prevention Plan  Transportation Screening Complete  Medication Review Oceanographer) Complete  HRI or Home Care Consult Complete  SW Recovery Care/Counseling Consult Complete  Palliative Care Screening Not Applicable  Skilled Nursing Facility Not Applicable

## 2023-06-12 NOTE — Progress Notes (Signed)
 Occupational Therapy Treatment Patient Details Name: Walter Vargas MRN: [id number] DOB: [birth year] Today's Date: 06/12/2023   History of present illness 78 year old male with history of GERD, hyperlipidemia, CAD status post CABG, on Eliquis and amiodarone, carotid enterectomy, who presented to emergency department after a fall.  Patient was in the ED from 05/24/2023 -06/01/23 pending discharge to skilled nursing facility, but was noted to have hypoxic respite failure, desatting to 80% on room air.  Now admitted with pneumonia.   OT comments  Pt seen for OT tx this date, agreeable to session with encouragement. On RA, SpO2 99% after activity. Pt presents with decreased cognition, lacks insight into deficits and has poor safety awareness. Requires max multimodal cuing for appropriate hand placement during functional STS using RW. Pt perseverates on switching hand placement on RW, requires hand-over-hand assist to place hands on bed prior to standing. Grooming tasks with CGA for dynamic standing balance at sink, functional mobility ~1 lap on unit with CGA and RW. OT facilitates transfer training with performing 2x STS from recliner with pt able to demonstrate effective, safe transfer end of session with CGA and good technique. Pt will require 24/7 supervision at hospital discharge due to impairments in balance, cognition, vision and strength. Would benefit from skilled OT to reduce falls risk and decrease caregiver burden, OT will continue to follow.       If plan is discharge home, recommend the following:  A little help with walking and/or transfers;A little help with bathing/dressing/bathroom;Assistance with cooking/housework;Assist for transportation;Help with stairs or ramp for entrance;Direct supervision/assist for financial management;Direct supervision/assist for medications management;Supervision due to cognitive status   Equipment Recommendations  Other (comment)    Recommendations for Other  Services      Precautions / Restrictions Precautions Precautions: Fall Recall of Precautions/Restrictions: Impaired Precaution/Restrictions Comments: mildly impulsive, sternal precautions Restrictions Weight Bearing Restrictions Per Provider Order: Yes RUE Weight Bearing Per Provider Order: Weight bearing as tolerated LUE Weight Bearing Per Provider Order: Weight bearing as tolerated Other Position/Activity Restrictions: sternal precautions       Mobility Bed Mobility Overal bed mobility: Needs Assistance Bed Mobility: Supine to Sit, Sit to Supine     Supine to sit: Supervision Sit to supine: Supervision        Transfers Overall transfer level: Needs assistance Equipment used: Rolling walker (2 wheels) Transfers: Sit to/from Stand Sit to Stand: Min assist     Step pivot transfers: Contact guard assist     General transfer comment: poor problem-solving ability, pt attempts several times to stand while pulling up from RW unsuccessfully. When provided opportunity to problem-solve, pt perseverates on wrong hand placement. minA to rise from regular height bed on 3rd attempt.     Balance Overall balance assessment: Needs assistance Sitting-balance support: Feet supported Sitting balance-Leahy Scale: Good Sitting balance - Comments: able to fix socks seated EOB   Standing balance support: No upper extremity supported, During functional activity Standing balance-Leahy Scale: Fair Standing balance comment: Due to cognition/impusivity/ poor awareness, pt is at risk of falls. CGA for standing balance with no UE support at sink                           ADL either performed or assessed with clinical judgement   ADL Overall ADL's : Needs assistance/impaired     Grooming: Standing;Wash/dry hands Grooming Details (indicate cue type and reason): CGA for standing balance  Functional mobility during ADLs: Rolling walker (2  wheels);Cueing for safety;Contact guard assist General ADL Comments: CGA with maxmultimodal cuing for hand placement during functional transfers. Even with max cuing, pt tends to switch hand placment to different areas of RW. HOH assist to place hands on bed / recliner to facilitate appropraite transfers     Communication Communication Communication: Impaired Factors Affecting Communication: Hearing impaired   Cognition Arousal: Alert Behavior During Therapy: WFL for tasks assessed/performed Cognition: Cognition impaired   Orientation impairments: Time, Situation Awareness: Intellectual awareness impaired, Online awareness impaired Memory impairment (select all impairments): Declarative long-term memory, Working memory Attention impairment (select first level of impairment): Sustained attention Executive functioning impairment (select all impairments): Initiation, Organization, Sequencing, Reasoning, Problem solving OT - Cognition Comments: Decreased safety awareness, decreased awareness of deficits, and slow processing appreciated t/o functional activity. Requires cuing for safety/re-direction to tasks.                 Following commands: Intact Following commands impaired: Follows one step commands with increased time      Cueing   Cueing Techniques: Verbal cues, Tactile cues        General Comments multiple wounds on skin covered with bandages    Pertinent Vitals/ Pain       Pain Assessment Pain Assessment: No/denies pain Pain Score: 0-No pain   Frequency  Min 2X/week        Progress Toward Goals  OT Goals(current goals can now be found in the care plan section)  Progress towards OT goals: Progressing toward goals  Acute Rehab OT Goals OT Goal Formulation: With patient Time For Goal Achievement: 06/20/23 Potential to Achieve Goals: Fair ADL Goals Pt Will Perform Grooming: with supervision;standing Pt Will Perform Upper Body Bathing: with  supervision;sitting;standing Pt Will Perform Lower Body Bathing: with supervision;sit to/from stand Pt Will Perform Upper Body Dressing: with supervision;sitting Pt Will Perform Lower Body Dressing: with modified independence;sit to/from stand Pt Will Transfer to Toilet: with modified independence;ambulating;regular height toilet Pt Will Perform Toileting - Clothing Manipulation and hygiene: with modified independence;sitting/lateral leans;sit to/from stand Additional ADL Goal #1: pt will participate in further cognitive assessment/pill box assessment to facilitate improved IADL participation  Plan         AM-PAC OT "6 Clicks" Daily Activity     Outcome Measure   Help from another person eating meals?: None Help from another person taking care of personal grooming?: A Little Help from another person toileting, which includes using toliet, bedpan, or urinal?: A Little Help from another person bathing (including washing, rinsing, drying)?: A Little Help from another person to put on and taking off regular upper body clothing?: None Help from another person to put on and taking off regular lower body clothing?: A Little 6 Click Score: 20    End of Session Equipment Utilized During Treatment: Gait belt;Rolling walker (2 wheels)  OT Visit Diagnosis: Unsteadiness on feet (R26.81);Other abnormalities of gait and mobility (R26.89);Muscle weakness (generalized) (M62.81);Other symptoms and signs involving cognitive function   Activity Tolerance Patient tolerated treatment well   Patient Left in chair;with call bell/phone within reach;with chair alarm set   Nurse Communication Mobility status        Time: 8119-1478 OT Time Calculation (min): 23 min  Charges: OT General Charges $OT Visit: 1 Visit OT Treatments $Self Care/Home Management : 8-22 mins $Therapeutic Activity: 8-22 mins  Caydan Mctavish L. Quanna Wittke, OTR/L  06/12/23, 12:49 PM

## 2023-06-12 NOTE — TOC Progression Note (Signed)
 Transition of Care [facility]) - Progression Note    Patient Details  Name: Walter Vargas MRN: [id number] Date of Birth: [birth year]  Transition of Care [facility]) CM/SW Contact  Marlowe Sax, RN Phone Number: 06/12/2023, 3:20 PM  Clinical Narrative:   Sherron Monday with Shanda Bumps from [employer], she has not been able to contact the family either, she will try again and let me know how it goes, they have met with the patient in the past and have been following    Expected Discharge Plan: Skilled Nursing Facility Barriers to Discharge: Continued Medical Work up  Expected Discharge Plan and Services       Living arrangements for the past 2 months: Single Family Home                                       Social Determinants of Health (SDOH) Interventions SDOH Screenings   Food Insecurity: No Food Insecurity (06/01/2023)  Housing: Low Risk  (06/01/2023)  Transportation Needs: No Transportation Needs (06/01/2023)  Utilities: Not At Risk (06/01/2023)  Depression (PHQ2-9): Low Risk  (10/19/2022)  Social Connections: Socially Isolated (06/01/2023)  Tobacco Use: Medium Risk (06/01/2023)    Readmission Risk Interventions    03/20/2023   12:12 PM  Readmission Risk Prevention Plan  Transportation Screening Complete  Medication Review (RN Care Manager) Complete  HRI or Home Care Consult Complete  SW Recovery Care/Counseling Consult Complete  Palliative Care Screening Not Applicable  Skilled Nursing Facility Not Applicable

## 2023-06-13 DIAGNOSIS — J189 Pneumonia, unspecified organism: Secondary | ICD-10-CM | POA: Diagnosis not present

## 2023-06-13 LAB — CBC WITH DIFFERENTIAL/PLATELET
Abs Immature Granulocytes: 0.03 10*3/uL (ref 0.00–0.07)
Basophils Absolute: 0 10*3/uL (ref 0.0–0.1)
Basophils Relative: 1 %
Eosinophils Absolute: 0.3 10*3/uL (ref 0.0–0.5)
Eosinophils Relative: 4 %
HCT: 32.7 % — ABNORMAL LOW (ref 39.0–52.0)
Hemoglobin: 10.4 g/dL — ABNORMAL LOW (ref 13.0–17.0)
Immature Granulocytes: 1 %
Lymphocytes Relative: 20 %
Lymphs Abs: 1.3 10*3/uL (ref 0.7–4.0)
MCH: 28.2 pg (ref 26.0–34.0)
MCHC: 31.8 g/dL (ref 30.0–36.0)
MCV: 88.6 fL (ref 80.0–100.0)
Monocytes Absolute: 0.8 10*3/uL (ref 0.1–1.0)
Monocytes Relative: 12 %
Neutro Abs: 4.2 10*3/uL (ref 1.7–7.7)
Neutrophils Relative %: 62 %
Platelets: 373 10*3/uL (ref 150–400)
RBC: 3.69 MIL/uL — ABNORMAL LOW (ref 4.22–5.81)
RDW: 19.9 % — ABNORMAL HIGH (ref 11.5–15.5)
WBC: 6.6 10*3/uL (ref 4.0–10.5)
nRBC: 0 % (ref 0.0–0.2)

## 2023-06-13 LAB — BASIC METABOLIC PANEL WITH GFR
Anion gap: 8 (ref 5–15)
BUN: 24 mg/dL — ABNORMAL HIGH (ref 8–23)
CO2: 27 mmol/L (ref 22–32)
Calcium: 8.5 mg/dL — ABNORMAL LOW (ref 8.9–10.3)
Chloride: 95 mmol/L — ABNORMAL LOW (ref 98–111)
Creatinine, Ser: 0.68 mg/dL (ref 0.61–1.24)
GFR, Estimated: >60 mL/min (ref >60)
Glucose, Bld: 95 mg/dL (ref 70–99)
Potassium: 4.7 mmol/L (ref 3.5–5.1)
Sodium: 130 mmol/L — ABNORMAL LOW (ref 135–145)

## 2023-06-13 NOTE — Progress Notes (Signed)
 Physical Therapy Treatment Patient Details Name: Walter Vargas MRN: [id number] DOB: 08-20-1945 Today's Date: 06/13/2023   History of Present Illness 78 year old male with history of GERD, hyperlipidemia, CAD status post CABG, on Eliquis and amiodarone, carotid enterectomy, who presented to emergency department after a fall.  Patient was in the ED from 05/24/2023 -06/01/23 pending discharge to skilled nursing facility, but was noted to have hypoxic respite failure, desatting to 80% on room air.  Now admitted with pneumonia.    PT Comments  Pt was long sitting in bed upon arrival. He is alert and oriented x 2. Remains extremely pleasant and cooperative. Was able to exit bed, stand to RW, and tolerate ambulation. Pt did have one episode of LOB with intervention to prevent fall. W/c follow for safety. Session progressed to pt ambulating outside on uneven terrain. Overall pt tolerated session well. He will continue to benefit from skilled PT at DC to maximize his independence and safety with all ADLs    If plan is discharge home, recommend the following: A little help with walking and/or transfers;Assistance with cooking/housework;Direct supervision/assist for medications management;Direct supervision/assist for financial management;Assist for transportation;Help with stairs or ramp for entrance;Supervision due to cognitive status     Equipment Recommendations  Other (comment)       Precautions / Restrictions Precautions Precautions: Fall Recall of Precautions/Restrictions: Impaired Restrictions Weight Bearing Restrictions Per Provider Order: Yes RUE Weight Bearing Per Provider Order: Weight bearing as tolerated LUE Weight Bearing Per Provider Order: Weight bearing as tolerated Other Position/Activity Restrictions: sternal precautions- unsure how long cardiologist likes precautions to be adhered to     Mobility  Bed Mobility Overal bed mobility: Needs Assistance Bed Mobility: Supine to Sit, Sit  to Supine  Supine to sit: Supervision   Transfers Overall transfer level: Needs assistance Equipment used: Rolling walker (2 wheels) Transfers: Sit to/from Stand Sit to Stand: Contact guard assist  General transfer comment: CGA with vcs for handplacement, fwd wt shift, and overall technique improvements    Ambulation/Gait Ambulation/Gait assistance: Contact guard assist, Mod assist Gait Distance (Feet): 200 Feet Assistive device: Rolling walker (2 wheels) Gait Pattern/deviations: Step-through pattern Gait velocity: Decreased  General Gait Details: Pt mostly required CGA for safety however one episode of LOB with mod assist to prevent falling when pt attempted to open door to go to different unit. Author took pt outside to have him ambulate on uneven surfaces.   Balance Overall balance assessment: Needs assistance Sitting-balance support: Feet supported Sitting balance-Leahy Scale: Good     Standing balance support: No upper extremity supported, During functional activity Standing balance-Leahy Scale: Fair Standing balance comment: Due to cognition/impusivity/ poor awareness, pt is at risk of falls.     Communication Communication Communication: No apparent difficulties  Cognition Arousal: Alert Behavior During Therapy: WFL for tasks assessed/performed   PT - Cognitive impairments: History of cognitive impairments   Orientation impairments: Time, Situation    PT - Cognition Comments: Pt is A and O x 2 howvere lacks full awareness of current situation + time. He is extremely cooperative and pleasant overall. Following commands: Intact Following commands impaired: Follows one step commands with increased time    Cueing Cueing Techniques: Verbal cues, Tactile cues         Pertinent Vitals/Pain Pain Assessment Pain Assessment: No/denies pain Pain Score: 0-No pain     PT Goals (current goals can now be found in the care plan section) Acute Rehab PT Goals Patient Stated  Goal: none stated Progress  towards PT goals: Progressing toward goals    Frequency    Min 2X/week       Co-evaluation     PT goals addressed during session: Mobility/safety with mobility;Proper use of DME;Balance;Strengthening/ROM        AM-PAC PT "6 Clicks" Mobility   Outcome Measure  Help needed turning from your back to your side while in a flat bed without using bedrails?: A Little Help needed moving from lying on your back to sitting on the side of a flat bed without using bedrails?: A Little Help needed moving to and from a bed to a chair (including a wheelchair)?: A Little Help needed standing up from a chair using your arms (e.g., wheelchair or bedside chair)?: A Little Help needed to walk in hospital room?: A Little Help needed climbing 3-5 steps with a railing? : A Little 6 Click Score: 18    End of Session Equipment Utilized During Treatment: Gait belt Activity Tolerance: Patient tolerated treatment well Patient left: in chair;with call bell/phone within reach;with chair alarm set Nurse Communication: Mobility status PT Visit Diagnosis: Other abnormalities of gait and mobility (R26.89);Difficulty in walking, not elsewhere classified (R26.2);Muscle weakness (generalized) (M62.81);Unsteadiness on feet (R26.81);Repeated falls (R29.6)     Time: 1111-1130 PT Time Calculation (min) (ACUTE ONLY): 19 min  Charges:    $Gait Training: 8-22 mins PT General Charges $$ ACUTE PT VISIT: 1 Visit                    Jetta Lout PTA 06/13/23, 2:30 PM

## 2023-06-13 NOTE — Progress Notes (Signed)
 Nutrition Follow-up  DOCUMENTATION CODES:   Severe malnutrition in context of chronic illness  INTERVENTION:   -Continue MVI with minerals daily -Continue 500 mg vitamin C BID -Continue 220 mg zinc sulfate daily x 14 days -Continue Ensure Enlive po BID, each supplement provides 350 kcal and 20 grams of protein.  -Continue Magic cup BID with meals, each supplement provides 290 kcal and 9 grams of protein  -Continue 2 gram sodium diet  NUTRITION DIAGNOSIS:   Severe Malnutrition related to chronic illness (CAD, CHF) as evidenced by moderate fat depletion, severe fat depletion, percent weight loss, moderate muscle depletion, severe muscle depletion.  Ongoing  GOAL:   Patient will meet greater than or equal to 90% of their needs  Progressing   MONITOR:   PO intake, Supplement acceptance  REASON FOR ASSESSMENT:   Consult Assessment of nutrition requirement/status  ASSESSMENT:   Pt with history of GERD, hyperlipidemia, CAD status post CABG, on Eliquis and amiodarone, carotid enterectomy, who presents for chief concerns of falling.  Reviewed I/O's: -450 ml x 24 hours and -5.1 L since 05/30/23   UOP: 1.2 L x 24 hours  Pt remains with good appetite. Noted meal completions 90-100%. Pt also drinking Ensure supplements.   Wt has been stable over the past week.   Per TOC notes, pt awaiting SNF placement.   Medications reviewed and include vitamin C, farxiga, lasix, protonix, miralax, senokot, and zinc sulfate.   Labs reviewed: Na: 130, CBGS: 71 (inpatient orders for glycemic control are ).    Diet Order:   Diet Order             Diet 2 gram sodium Fluid consistency: Thin  Diet effective now                   EDUCATION NEEDS:   Education needs have been addressed  Skin:  Skin Assessment: Skin Integrity Issues: Skin Integrity Issues:: Other (Comment), Stage II, Stage I Stage I: heel Stage II: coccyx Other: skin tears to lt arm and thigh  Last BM:  06/12/23  (type 3)  Height:   Ht Readings from Last 1 Encounters:  06/01/23 5\' 8"  (1.727 m)    Weight:   Wt Readings from Last 1 Encounters:  06/13/23 60 kg    Ideal Body Weight:  70 kg  BMI:  Body mass index is 20.11 kg/m.  Estimated Nutritional Needs:   Kcal:  1800-2000  Protein:  100-115 grams  Fluid:  1.8-2.0 L    Levada Schilling, RD, LDN, CDCES Registered Dietitian III Certified Diabetes Care and Education Specialist If unable to reach this RD, please use "RD Inpatient" group chat on secure chat between hours of 8am-4 pm daily

## 2023-06-13 NOTE — Progress Notes (Signed)
 Progress Note   Patient: Walter Vargas [id number] DOB: [birth year] DOA: 05/24/2023     12 DOS: the patient was seen and examined on 06/13/2023     Brief hospital course: HPI on admission 06/01/23: "Walter Vargas is a 78 year old male with history of GERD, hyperlipidemia, CAD status post CABG, on Eliquis and amiodarone, carotid enterectomy, who presents emergency department for chief concerns of falling.   Patient was pending discharge to skilled nursing facility in the ED since 05/24/2023.   On day of admission, patient was noted to have hypoxic respite failure, desatting to 80% on room air.  Patient was placed on 6 L nasal cannula with improvement to 94%.  Patient has been titrated down to 3 L nasal cannula at this time with SpO2 100% on room air.  And during hypoxic episode, patient was noted to be altered and trying to pull out leads and oxygen supplementation.  Patient is currently more redirectable.   Vitals in the ED showed temperature of 98.9, respiration rate of 26, heart rate 94, blood pressure 125/70, SpO2 of 80% on room air, currently improved to 1 high percent on 3 L nasal cannula...." See H&P for full HPI on admission & ED course.   Patient was admitted for further evaluation and management of severe sepsis due  multifocal pneumonia complicated by acute respiratory failure with hypoxia.   Further hospital course and management as outlined below.       Assessment and Plan:     Severe sepsis due to Multifocal Pneumonia, suspect hospital-acquired Pt was boarding in the ED awaiting discharge to SNF for 7 days prior to admission. Severe sepsis evidenced by tachypnea, leukocytosis, lactic acidosis with pulmonary and cardiac organ involvement in setting of acute hypoxic respiratory failure Has completed antibiotic course Continue mucolytics, bronchodilators and other supportive care per orders     Acute respiratory failure with hypoxia - due to PNA, pleural effusions,  ?CHF --Mgmt of underlying issues as outlined --O2 per protocol, wean as tolerated, target sats > 90%   Chronic combined systolic and diastolic CHF  History of VT (peri-CABG surgery) Abnormal EKG Elevated Troponins likely Demand Ischemia in setting of sepsis and hypoxia Hx of CAD s/p CABGx5 (Dec 2024) Echo 04/20/2023: EF ~ 20--25%, grade II DD Given Lasix 40 mg IV x 1 on admission BNP elevated 3075 (note baseline BNP appears ~2200+) Cardiology was on board but have since signed off Continue amiodarone amiodarone 200 mg daily Continue Lasix 20 mg PO daily, low dose losartan  Continue aspirin, Plavix, Lipitor Monitor input and output Continue to monitor daily weight Has completed a course of heparin drip     Bilateral pleural effusions Continue to monitor input and output --Will consider thoracentesis if needed   Normocytic anemia Monitor for signs of bleeding as well as CBC To transfuse RBC's if Hbg < 8 cardiac history   Hyperlipidemia Continue Lipitor   CAD - stable S/P CABG x 5 Continue aspirin and Lipitor    Protein-calorie malnutrition, severe Dietitian on board   Subjective:  Patient seen and examined at bedside this morning Patient denies any acute overnight events TOC still working on placement   Physical examination General exam: awake & alert, no acute distress, frail, chronically ill appearing HEENT: moist mucus membranes, hearing grossly normal  Respiratory system: CTAB, no wheezes, normal respiratory effort at rest on room air Cardiovascular system: normal S1/S2, RRR, no JVD, no pedal edema.   Gastrointestinal system: soft, NT, ND Central nervous system: A&O x  self and hospital. no gross focal neurologic deficits, normal speech Extremities: moves all, no edema, normal tone Psychiatry: normal mood, congruent affect   Family Communication: None present   Disposition: Status is: Inpatient Remains inpatient appropriate because: severity of illness as  above, on IV therapies, ongoing evaluation    Planned Discharge Destination: Patient is medically stable pending skilled nursing facility     Data Reviewed:    Vitals:   06/12/23 1631 06/12/23 2013 06/13/23 0438 06/13/23 0815  BP: 106/73 (!) 112/54  (!) 114/59  Pulse: (!) 52 (!) 101  (!) 55  Resp: 16 18  18   Temp: 97.7 F (36.5 C) 98.2 F (36.8 C)  97.7 F (36.5 C)  TempSrc:      SpO2: 100% 93%  98%  Weight:   60 kg   Height:          Latest Ref Rng & Units 06/13/2023    2:14 AM 06/09/2023    4:28 AM 06/08/2023   10:10 AM  CBC  WBC 4.0 - 10.5 K/uL 6.6  6.7  6.6   Hemoglobin 13.0 - 17.0 g/dL 16.1  09.6  04.5   Hematocrit 39.0 - 52.0 % 32.7  32.8  36.2   Platelets 150 - 400 K/uL 373  378  393        Latest Ref Rng & Units 06/13/2023    2:14 AM 06/09/2023    4:28 AM 06/08/2023   10:10 AM  BMP  Glucose 70 - 99 mg/dL 95  89  73   BUN 8 - 23 mg/dL 24  22  15    Creatinine 0.61 - 1.24 mg/dL 4.09  8.11  9.14   Sodium 135 - 145 mmol/L 130  135  136   Potassium 3.5 - 5.1 mmol/L 4.7  4.8  4.2   Chloride 98 - 111 mmol/L 95  101  101   CO2 22 - 32 mmol/L 27  27  28    Calcium 8.9 - 10.3 mg/dL 8.5  8.8  8.9      Author: Loyce Dys, MD 06/13/2023 12:26 PM  For on call review [email or web address].

## 2023-06-13 NOTE — TOC Progression Note (Signed)
 Transition of Care [facility]) - Progression Note    Patient Details  Name: Walter Vargas MRN: [id number] Date of Birth: [birth year]  Transition of Care [facility]) CM/SW Contact  Marlowe Sax, RN Phone Number: 06/13/2023, 10:57 AM  Clinical Narrative:     Called son Remi Deter to find out if he was able to speak with [facility] about finding a memory care facility, was not able to reach, left a HIPAA compliant VM asking for a call back  Expected Discharge Plan: Skilled Nursing Facility Barriers to Discharge: Continued Medical Work up  Expected Discharge Plan and Services       Living arrangements for the past 2 months: Single Family Home                                       Social Determinants of Health (SDOH) Interventions SDOH Screenings   Food Insecurity: No Food Insecurity (06/01/2023)  Housing: Low Risk  (06/01/2023)  Transportation Needs: No Transportation Needs (06/01/2023)  Utilities: Not At Risk (06/01/2023)  Depression (PHQ2-9): Low Risk  (10/19/2022)  Social Connections: Socially Isolated (06/01/2023)  Tobacco Use: Medium Risk (06/01/2023)    Readmission Risk Interventions    03/20/2023   12:12 PM  Readmission Risk Prevention Plan  Transportation Screening Complete  Medication Review [employer]) Complete  HRI or Home Care Consult Complete  SW Recovery Care/Counseling Consult Complete  Palliative Care Screening Not Applicable  Skilled Nursing Facility Not Applicable

## 2023-06-14 DIAGNOSIS — J189 Pneumonia, unspecified organism: Secondary | ICD-10-CM | POA: Diagnosis not present

## 2023-06-14 NOTE — Progress Notes (Signed)
 Progress Note   Patient: Walter Vargas ZOX:[id number] DOB: 1945-09-28 DOA: 05/24/2023     13 DOS: the patient was seen and examined on 06/14/2023     Brief hospital course: HPI on admission 06/01/23: "Walter Vargas is a 78 year old male with history of GERD, hyperlipidemia, CAD status post CABG, on Eliquis and amiodarone, carotid enterectomy, who presents emergency department for chief concerns of falling.   Patient was pending discharge to skilled nursing facility in the ED since 05/24/2023.   On day of admission, patient was noted to have hypoxic respite failure, desatting to 80% on room air.  Patient was placed on 6 L nasal cannula with improvement to 94%.  Patient has been titrated down to 3 L nasal cannula at this time with SpO2 100% on room air.  And during hypoxic episode, patient was noted to be altered and trying to pull out leads and oxygen supplementation.  Patient is currently more redirectable.   Vitals in the ED showed temperature of 98.9, respiration rate of 26, heart rate 94, blood pressure 125/70, SpO2 of 80% on room air, currently improved to 1 high percent on 3 L nasal cannula...." See H&P for full HPI on admission & ED course.   Patient was admitted for further evaluation and management of severe sepsis due  multifocal pneumonia complicated by acute respiratory failure with hypoxia.   Further hospital course and management as outlined below.       Assessment and Plan:     Severe sepsis due to Multifocal Pneumonia, suspect hospital-acquired Pt was boarding in the ED awaiting discharge to SNF for 7 days prior to admission. Severe sepsis evidenced by tachypnea, leukocytosis, lactic acidosis with pulmonary and cardiac organ involvement in setting of acute hypoxic respiratory failure Has completed antibiotic course Continue mucolytics, bronchodilators and other supportive care per orders     Acute respiratory failure with hypoxia - due to PNA, pleural effusions,  ?CHF --Mgmt of underlying issues as outlined --O2 per protocol, wean as tolerated, target sats > 90%   Chronic combined systolic and diastolic CHF  History of VT (peri-CABG surgery) Abnormal EKG Elevated Troponins likely Demand Ischemia in setting of sepsis and hypoxia Hx of CAD s/p CABGx5 (Dec 2024) Echo 04/20/2023: EF ~ 20--25%, grade II DD Given Lasix 40 mg IV x 1 on admission BNP elevated 3075 (note baseline BNP appears ~2200+) Cardiology was on board but have since signed off Continue amiodarone 200 mg daily Continue low dose losartan  Continue aspirin, Plavix, Lipitor Monitor input and output Continue to monitor daily weight Has completed a course of heparin drip     Bilateral pleural effusions Continue to monitor input and output --Will consider thoracentesis if needed   Normocytic anemia Monitor for signs of bleeding as well as CBC To transfuse RBC's if Hbg < 8 cardiac history   Hyperlipidemia Continue Lipitor   CAD - stable S/P CABG x 5 Continue aspirin and Lipitor    Protein-calorie malnutrition, severe Dietitian on board   Subjective:  Patient seen and examined at bedside this morning Denies any acute overnight events TOC still working on placement  Physical examination General exam: awake & alert, no acute distress, frail, chronically ill appearing HEENT: moist mucus membranes, hearing grossly normal  Respiratory system: CTAB, no wheezes, normal respiratory effort at rest on room air Cardiovascular system: normal S1/S2, RRR, no JVD, no pedal edema.   Gastrointestinal system: soft, NT, ND Central nervous system: A&O x self and hospital. no gross focal neurologic deficits,  normal speech Extremities: moves all, no edema, normal tone Psychiatry: normal mood, congruent affect   Family Communication: None present-son called but no response   Disposition: Status is: Inpatient Remains inpatient appropriate because: severity of illness as above, on IV  therapies, ongoing evaluation    Planned Discharge Destination: Patient is medically stable pending skilled nursing facility     Data Reviewed:      Latest Ref Rng & Units 06/13/2023    2:14 AM 06/09/2023    4:28 AM 06/08/2023   10:10 AM  BMP  Glucose 70 - 99 mg/dL 95  89  73   BUN 8 - 23 mg/dL 24  22  15    Creatinine 0.61 - 1.24 mg/dL 2.13  0.86  5.78   Sodium 135 - 145 mmol/L 130  135  136   Potassium 3.5 - 5.1 mmol/L 4.7  4.8  4.2   Chloride 98 - 111 mmol/L 95  101  101   CO2 22 - 32 mmol/L 27  27  28    Calcium 8.9 - 10.3 mg/dL 8.5  8.8  8.9     Vitals:   06/13/23 2030 06/14/23 0449 06/14/23 0500 06/14/23 0939  BP: (!) 103/54 113/63  (!) 106/59  Pulse: (!) 57 (!) 55  (!) 59  Resp: 16 18  17   Temp: 98.2 F (36.8 C) 97.9 F (36.6 C)  98 F (36.7 C)  TempSrc:      SpO2: 100% 100%  100%  Weight:   59.8 kg   Height:          Latest Ref Rng & Units 06/13/2023    2:14 AM 06/09/2023    4:28 AM 06/08/2023   10:10 AM  CBC  WBC 4.0 - 10.5 K/uL 6.6  6.7  6.6   Hemoglobin 13.0 - 17.0 g/dL 46.9  62.9  52.8   Hematocrit 39.0 - 52.0 % 32.7  32.8  36.2   Platelets 150 - 400 K/uL 373  378  393      Author: Loyce Dys, MD 06/14/2023 12:41 PM  For on call review [email or web address].

## 2023-06-14 NOTE — Progress Notes (Signed)
 Physical Therapy Treatment Patient Details Name: Walter Vargas MRN: [id number] DOB: 09-09-1945 Today's Date: 06/14/2023   History of Present Illness 78 year old male with history of GERD, hyperlipidemia, CAD status post CABG, on Eliquis and amiodarone, carotid enterectomy, who presented to emergency department after a fall.  Patient was in the ED from 05/24/2023 -06/01/23 pending discharge to skilled nursing facility, but was noted to have hypoxic respite failure, desatting to 80% on room air.  Now admitted with pneumonia.    PT Comments   Pt was long sitting in bed upon arrival. He is alert and oriented x 2. Remains extremely pleasant and cooperative. Was able to exit bed, stand to RW, and tolerate ambulation 2 laps in hallway.  Overall pt tolerated session well. He will continue to benefit from skilled PT at DC to maximize his independence and safety with all ADLs.    If plan is discharge home, recommend the following: A little help with walking and/or transfers;Assistance with cooking/housework;Direct supervision/assist for medications management;Direct supervision/assist for financial management;Assist for transportation;Help with stairs or ramp for entrance;Supervision due to cognitive status     Equipment Recommendations  Other (comment) (ongoing assessment)       Precautions / Restrictions Precautions Precautions: Fall Recall of Precautions/Restrictions: Impaired Restrictions Weight Bearing Restrictions Per Provider Order: Yes RUE Weight Bearing Per Provider Order: Weight bearing as tolerated LUE Weight Bearing Per Provider Order: Weight bearing as tolerated Other Position/Activity Restrictions: sternal precautions- unsure how long cardiologist likes precautions to be adhered to     Mobility  Bed Mobility Overal bed mobility: Needs Assistance Bed Mobility: Supine to Sit  Supine to sit: Supervision   Transfers Overall transfer level: Needs assistance Equipment used: Rolling  walker (2 wheels) Transfers: Sit to/from Stand Sit to Stand: Contact guard assist   Ambulation/Gait Ambulation/Gait assistance: Contact guard assist, Mod assist Gait Distance (Feet): 400 Feet Assistive device: Rolling walker (2 wheels) Gait Pattern/deviations: Step-through pattern Gait velocity: decreased  General Gait Details: Pt was easily able to ambulate 2 laps around unit with RW. does endorse some heel pain but overall tolerated well.   Balance Overall balance assessment: Needs assistance Sitting-balance support: Feet supported Sitting balance-Leahy Scale: Good     Standing balance support: During functional activity, Bilateral upper extremity supported Standing balance-Leahy Scale: Fair       Hotel manager: No apparent difficulties  Cognition Arousal: Alert Behavior During Therapy: WFL for tasks assessed/performed   PT - Cognitive impairments: History of cognitive impairments   Orientation impairments: Time, Situation      PT - Cognition Comments: Pt A and O x3. Seemed to have much better recall and memory today from things discussed last session. Following commands: Intact Following commands impaired: Follows one step commands with increased time    Cueing Cueing Techniques: Verbal cues, Tactile cues         Pertinent Vitals/Pain Pain Assessment Pain Assessment: No/denies pain Pain Score: 0-No pain     PT Goals (current goals can now be found in the care plan section) Acute Rehab PT Goals Patient Stated Goal: none stated Progress towards PT goals: Progressing toward goals    Frequency    Min 2X/week       Co-evaluation     PT goals addressed during session: Mobility/safety with mobility;Balance;Proper use of DME;Strengthening/ROM        AM-PAC PT "6 Clicks" Mobility   Outcome Measure  Help needed turning from your back to your side while in a flat bed without using  bedrails?: A Little Help needed moving from  lying on your back to sitting on the side of a flat bed without using bedrails?: A Little Help needed moving to and from a bed to a chair (including a wheelchair)?: A Little Help needed standing up from a chair using your arms (e.g., wheelchair or bedside chair)?: A Little Help needed to walk in hospital room?: A Little Help needed climbing 3-5 steps with a railing? : A Little 6 Click Score: 18    End of Session Equipment Utilized During Treatment: Gait belt Activity Tolerance: Patient tolerated treatment well Patient left: in bed;with call bell/phone within reach;with bed alarm set Nurse Communication: Mobility status PT Visit Diagnosis: Other abnormalities of gait and mobility (R26.89);Difficulty in walking, not elsewhere classified (R26.2);Muscle weakness (generalized) (M62.81);Unsteadiness on feet (R26.81);Repeated falls (R29.6)     Time: 4098-1191 PT Time Calculation (min) (ACUTE ONLY): 19 min  Charges:    $Gait Training: 8-22 mins PT General Charges $$ ACUTE PT VISIT: 1 Visit                     Jetta Lout PTA 06/14/23, 3:52 PM

## 2023-06-14 NOTE — Progress Notes (Signed)
 Occupational Therapy Treatment Patient Details Name: Walter Vargas MRN: [id number] DOB: 01-03-1946 Today's Date: 06/14/2023   History of present illness 78 year old male with history of GERD, hyperlipidemia, CAD status post CABG, on Eliquis and amiodarone, carotid enterectomy, who presented to emergency department after a fall.  Patient was in the ED from 05/24/2023 -06/01/23 pending discharge to skilled nursing facility, but was noted to have hypoxic respite failure, desatting to 80% on room air.  Now admitted with pneumonia.   OT comments  Pt received in bed, agreeable to participate with encouragement. Improved ability to perform STS transfers from seated EOB, requires mod verbal cues vs multimodal sequencing for hand placement. Pt able to don R sock using seated figure four, but requires MOD A for L sock due to cognition. Functional mobility using RW, CGA for safety and cues for AD proximity ~150 ft. Grooming tasks performed standing at sink, CGA for standing balance. Pt with 1 LOB while changing directions using RW, but able to self correct without physical assist. Pt left in recliner, needs in reach and alarm activated. OT will continue to progress for functional gains. Pt requires 24/7 supervision for ADLs due to cognitive impairments.       If plan is discharge home, recommend the following:  A little help with walking and/or transfers;A little help with bathing/dressing/bathroom;Assistance with cooking/housework;Assist for transportation;Help with stairs or ramp for entrance;Direct supervision/assist for financial management;Direct supervision/assist for medications management;Supervision due to cognitive status   Equipment Recommendations  Other (comment)    Recommendations for Other Services      Precautions / Restrictions Precautions Precautions: Fall Recall of Precautions/Restrictions: Impaired Restrictions Weight Bearing Restrictions Per Provider Order: Yes RUE Weight Bearing Per  Provider Order: Weight bearing as tolerated LUE Weight Bearing Per Provider Order: Weight bearing as tolerated Other Position/Activity Restrictions: sternal precautions- unsure how long cardiologist likes precautions to be adhered to       Mobility Bed Mobility Overal bed mobility: Needs Assistance Bed Mobility: Supine to Sit     Supine to sit: Supervision          Transfers Overall transfer level: Needs assistance Equipment used: Rolling walker (2 wheels) Transfers: Sit to/from Stand Sit to Stand: Contact guard assist           General transfer comment: CGA with vcs for hand placement, does better with RW outside proxmity to stand and then provide AD     Balance Overall balance assessment: Needs assistance Sitting-balance support: Feet supported       Standing balance support: During functional activity, Bilateral upper extremity supported Standing balance-Leahy Scale: Fair Standing balance comment: Due to cognition/impusivity/ poor awareness, pt is at risk of falls.                           ADL either performed or assessed with clinical judgement   ADL Overall ADL's : Needs assistance/impaired     Grooming: Standing;Wash/dry hands;Wash/dry face;Brushing hair Grooming Details (indicate cue type and reason): CGA for standing balance sink level         Upper Body Dressing : Minimal assistance;Sitting Upper Body Dressing Details (indicate cue type and reason): donn/doff gown Lower Body Dressing: Moderate assistance;Sit to/from stand Lower Body Dressing Details (indicate cue type and reason): dons socks seated EOB, pt donned first sock with SBA, and requires MOD A to don second sock due to cognition and pt preference  Functional mobility during ADLs: Rolling walker (2 wheels);Cueing for safety;Contact guard assist       Communication Communication Communication: No apparent difficulties   Cognition Arousal: Alert Behavior  During Therapy: WFL for tasks assessed/performed Cognition: Cognition impaired                               Following commands: Intact Following commands impaired: Follows one step commands with increased time      Cueing   Cueing Techniques: Verbal cues, Tactile cues             Pertinent Vitals/ Pain       Pain Assessment Pain Assessment: No/denies pain Pain Score: 0-No pain         Frequency  Min 2X/week        Progress Toward Goals  OT Goals(current goals can now be found in the care plan section)  Progress towards OT goals: Progressing toward goals  Acute Rehab OT Goals OT Goal Formulation: With patient Time For Goal Achievement: 06/20/23 Potential to Achieve Goals: Fair ADL Goals Pt Will Perform Grooming: with supervision;standing Pt Will Perform Upper Body Bathing: with supervision;sitting;standing Pt Will Perform Lower Body Bathing: with supervision;sit to/from stand Pt Will Perform Upper Body Dressing: with supervision;sitting Pt Will Perform Lower Body Dressing: with modified independence;sit to/from stand Pt Will Transfer to Toilet: with modified independence;ambulating;regular height toilet Pt Will Perform Toileting - Clothing Manipulation and hygiene: with modified independence;sitting/lateral leans;sit to/from stand Additional ADL Goal #1: pt will participate in further cognitive assessment/pill box assessment to facilitate improved IADL participation  Plan         AM-PAC OT "6 Clicks" Daily Activity     Outcome Measure   Help from another person eating meals?: None Help from another person taking care of personal grooming?: A Little Help from another person toileting, which includes using toliet, bedpan, or urinal?: A Little Help from another person bathing (including washing, rinsing, drying)?: A Little Help from another person to put on and taking off regular upper body clothing?: None Help from another person to put on and  taking off regular lower body clothing?: A Little 6 Click Score: 20    End of Session Equipment Utilized During Treatment: Gait belt;Rolling walker (2 wheels)  OT Visit Diagnosis: Unsteadiness on feet (R26.81);Other abnormalities of gait and mobility (R26.89);Muscle weakness (generalized) (M62.81);Other symptoms and signs involving cognitive function   Activity Tolerance Patient tolerated treatment well   Patient Left in chair;with call bell/phone within reach;with chair alarm set   Nurse Communication Mobility status        Time: 1610-9604 OT Time Calculation (min): 25 min  Charges: OT General Charges $OT Visit: 1 Visit OT Treatments $Self Care/Home Management : 23-37 mins Jhada Risk L. Domique Reardon, OTR/L  06/14/23, 9:16 AM

## 2023-06-14 NOTE — Plan of Care (Signed)
   Problem: Clinical Measurements: Goal: Diagnostic test results will improve Outcome: Progressing   Problem: Nutrition: Goal: Adequate nutrition will be maintained Outcome: Progressing

## 2023-06-15 ENCOUNTER — Ambulatory Visit: Admitting: Nurse Practitioner

## 2023-06-15 DIAGNOSIS — J189 Pneumonia, unspecified organism: Secondary | ICD-10-CM | POA: Diagnosis not present

## 2023-06-15 NOTE — TOC Progression Note (Signed)
 Transition of Care [facility]) - Progression Note    Patient Details  Name: EMANI MORAD MRN: [id number] Date of Birth: 1945/10/11  Transition of Care [facility]) CM/SW Contact  Marlowe Sax, RN Phone Number: 06/15/2023, 1:09 PM  Clinical Narrative:     Attempted to reach Hulen Shouts and he did not answer I attempted to reach out to [facility] his daughter,no answer, I received a call from Theodis Shove and she stated she is not able to reach the son or daughter either, She stated that she came into see the patient yesterday and he is agreeable to ALF but was not able to provide financial information  Expected Discharge Plan: Skilled Nursing Facility Barriers to Discharge: Continued Medical Work up  Expected Discharge Plan and Services       Living arrangements for the past 2 months: Single Family Home                                       Social Determinants of Health (SDOH) Interventions SDOH Screenings   Food Insecurity: No Food Insecurity (06/01/2023)  Housing: Low Risk  (06/01/2023)  Transportation Needs: No Transportation Needs (06/01/2023)  Utilities: Not At Risk (06/01/2023)  Depression (PHQ2-9): Low Risk  (10/19/2022)  Social Connections: Socially Isolated (06/01/2023)  Tobacco Use: Medium Risk (06/01/2023)    Readmission Risk Interventions    03/20/2023   12:12 PM  Readmission Risk Prevention Plan  Transportation Screening Complete  Medication Review Oceanographer) Complete  HRI or Home Care Consult Complete  SW Recovery Care/Counseling Consult Complete  Palliative Care Screening Not Applicable  Skilled Nursing Facility Not Applicable

## 2023-06-15 NOTE — Progress Notes (Signed)
 Mobility Specialist - Progress Note   06/15/23 1000  Mobility  Activity Refused mobility     Pt declined mobility no reason specified. Pt reports he's anticipating d/c today.    Walter Vargas Mobility Specialist 06/15/23, 10:42 AM

## 2023-06-15 NOTE — Plan of Care (Signed)

## 2023-06-15 NOTE — TOC Progression Note (Addendum)
 Transition of Care [facility]) - Progression Note    Patient Details  Name: Walter Vargas MRN: [id number] Date of Birth: 16-Aug-1945  Transition of Care [facility]) CM/SW Contact  Marlowe Sax, RN Phone Number: 06/15/2023, 3:39 PM  Clinical Narrative:     Spoke with Danielle at [facility], she has found a potential bed at ALF and will need FL2 and TB skin test  Faxed Fl2 to her and TB skin test will be ordered and completed   Expected Discharge Plan: Skilled Nursing Facility Barriers to Discharge: Continued Medical Work up  Expected Discharge Plan and Services       Living arrangements for the past 2 months: Single Family Home                                       Social Determinants of Health (SDOH) Interventions SDOH Screenings   Food Insecurity: No Food Insecurity (06/01/2023)  Housing: Low Risk  (06/01/2023)  Transportation Needs: No Transportation Needs (06/01/2023)  Utilities: Not At Risk (06/01/2023)  Depression (PHQ2-9): Low Risk  (10/19/2022)  Social Connections: Socially Isolated (06/01/2023)  Tobacco Use: Medium Risk (06/01/2023)    Readmission Risk Interventions    03/20/2023   12:12 PM  Readmission Risk Prevention Plan  Transportation Screening Complete  Medication Review (RN Care Manager) Complete  HRI or Home Care Consult Complete  SW Recovery Care/Counseling Consult Complete  Palliative Care Screening Not Applicable  Skilled Nursing Facility Not Applicable

## 2023-06-15 NOTE — Progress Notes (Signed)
 PT Cancellation Note  Patient Details Name: Walter Vargas MRN: [id number] DOB: 01/28/1946   Cancelled Treatment:     PT attempt. Pt politely refused stating, "I'm just tired today. Can you come back another time?"    Rushie Chestnut 06/15/2023, 2:06 PM

## 2023-06-15 NOTE — Progress Notes (Addendum)
 Progress Note   Patient: Walter Vargas:[id number] DOB: [birth year] DOA: 05/24/2023     14 DOS: the patient was seen and examined on 06/15/2023      Brief hospital course: HPI on admission 06/01/23: "Walter Vargas is a 78 year old male with history of GERD, hyperlipidemia, CAD status post CABG, on Eliquis and amiodarone, carotid enterectomy, who presents emergency department for chief concerns of falling.   Patient was pending discharge to skilled nursing facility in the ED since 05/24/2023.   On day of admission, patient was noted to have hypoxic respite failure, desatting to 80% on room air.  Patient was placed on 6 L nasal cannula with improvement to 94%.  Patient has been titrated down to 3 L nasal cannula at this time with SpO2 100% on room air.  And during hypoxic episode, patient was noted to be altered and trying to pull out leads and oxygen supplementation.  Patient is currently more redirectable.   Vitals in the ED showed temperature of 98.9, respiration rate of 26, heart rate 94, blood pressure 125/70, SpO2 of 80% on room air, currently improved to 1 high percent on 3 L nasal cannula...." See H&P for full HPI on admission & ED course.   Patient was admitted for further evaluation and management of severe sepsis due  multifocal pneumonia complicated by acute respiratory failure with hypoxia.   Further hospital course and management as outlined below.       Assessment and Plan:     Severe sepsis due to Multifocal Pneumonia, suspect hospital-acquired Pt was boarding in the ED awaiting discharge to SNF for 7 days prior to admission. Severe sepsis evidenced by tachypnea, leukocytosis, lactic acidosis with pulmonary and cardiac organ involvement in setting of acute hypoxic respiratory failure Has completed antibiotic course Continue mucolytics, bronchodilators and other supportive care per orders     Acute respiratory failure with hypoxia - due to PNA, pleural effusions,  ?CHF --Mgmt of underlying issues as outlined --O2 per protocol, wean as tolerated, target sats > 90%   Chronic combined systolic and diastolic CHF  History of VT (peri-CABG surgery) Abnormal EKG Elevated Troponins likely Demand Ischemia in setting of sepsis and hypoxia Hx of CAD s/p CABGx5 (Dec 2024) Echo 04/20/2023: EF ~ 20--25%, grade II DD Given Lasix 40 mg IV x 1 on admission BNP elevated 3075 (note baseline BNP appears ~2200+) Cardiology was on board but have since signed off Continue amiodarone 200 mg daily Continue low dose losartan  Continue aspirin, Plavix, Lipitor Monitor input and output Continue to monitor daily weight Has completed a course of heparin drip   Hyponatremia likely secondary to hypovolemia Encourage oral hydration Monitor sodium level closely   Bilateral pleural effusions Continue to monitor input and output --Will consider thoracentesis if needed   Normocytic anemia Monitor for signs of bleeding as well as CBC To transfuse RBC's if Hbg < 8 cardiac history   Hyperlipidemia Continue Lipitor   CAD - stable S/P CABG x 5 Continue aspirin and Lipitor    Protein-calorie malnutrition, severe Dietitian on board   Subjective:  Patient denied any overnight complaints Still awaiting placement   Physical examination General exam: awake & alert, no acute distress, frail, chronically ill appearing HEENT: moist mucus membranes, hearing grossly normal  Respiratory system: CTAB, no wheezes, normal respiratory effort at rest on room air Cardiovascular system: normal S1/S2, RRR, no JVD, no pedal edema.   Gastrointestinal system: soft, NT, ND Central nervous system: A&O x self and hospital. no  gross focal neurologic deficits, normal speech Extremities: moves all, no edema, normal tone Psychiatry: normal mood, congruent affect   Family Communication: None present-son called but no response   Disposition: Status is: Inpatient Remains inpatient appropriate  because: severity of illness as above, on IV therapies, ongoing evaluation    Planned Discharge Destination: Patient is medically stable pending skilled nursing facility     Data Reviewed:      Vitals:   06/14/23 2008 06/15/23 0416 06/15/23 0548 06/15/23 0917  BP: (!) 91/54 106/60  109/66  Pulse: 61 (!) 58  65  Resp: 20 16  20   Temp: 98.1 F (36.7 C) 98.1 F (36.7 C)  (!) 97.5 F (36.4 C)  TempSrc:      SpO2: 97% 92%  96%  Weight:   59.4 kg   Height:          Latest Ref Rng & Units 06/13/2023    2:14 AM 06/09/2023    4:28 AM 06/08/2023   10:10 AM  CBC  WBC 4.0 - 10.5 K/uL 6.6  6.7  6.6   Hemoglobin 13.0 - 17.0 g/dL 16.1  09.6  04.5   Hematocrit 39.0 - 52.0 % 32.7  32.8  36.2   Platelets 150 - 400 K/uL 373  378  393        Latest Ref Rng & Units 06/13/2023    2:14 AM 06/09/2023    4:28 AM 06/08/2023   10:10 AM  BMP  Glucose 70 - 99 mg/dL 95  89  73   BUN 8 - 23 mg/dL 24  22  15    Creatinine 0.61 - 1.24 mg/dL 4.09  8.11  9.14   Sodium 135 - 145 mmol/L 130  135  136   Potassium 3.5 - 5.1 mmol/L 4.7  4.8  4.2   Chloride 98 - 111 mmol/L 95  101  101   CO2 22 - 32 mmol/L 27  27  28    Calcium 8.9 - 10.3 mg/dL 8.5  8.8  8.9      Author: Loyce Dys, MD 06/15/2023 3:10 PM  For on call review [email or web address].

## 2023-06-16 DIAGNOSIS — J189 Pneumonia, unspecified organism: Secondary | ICD-10-CM | POA: Diagnosis not present

## 2023-06-16 NOTE — Progress Notes (Signed)
 Progress Note   Patient: Walter Vargas RUE:[id number] DOB: 1945-07-07 DOA: 05/24/2023     15 DOS: the patient was seen and examined on 06/16/2023    Brief hospital course: HPI on admission 06/01/23: "Walter Vargas is a 78 year old male with history of GERD, hyperlipidemia, CAD status post CABG, on Eliquis and amiodarone, carotid enterectomy, who presents emergency department for chief concerns of falling.   Patient was pending discharge to skilled nursing facility in the ED since 05/24/2023.   On day of admission, patient was noted to have hypoxic respite failure, desatting to 80% on room air.  Patient was placed on 6 L nasal cannula with improvement to 94%.  Patient has been titrated down to 3 L nasal cannula at this time with SpO2 100% on room air.  And during hypoxic episode, patient was noted to be altered and trying to pull out leads and oxygen supplementation.  Patient is currently more redirectable.   Vitals in the ED showed temperature of 98.9, respiration rate of 26, heart rate 94, blood pressure 125/70, SpO2 of 80% on room air, currently improved to 1 high percent on 3 L nasal cannula...." See H&P for full HPI on admission & ED course.   Patient was admitted for further evaluation and management of severe sepsis due  multifocal pneumonia complicated by acute respiratory failure with hypoxia.   Further hospital course and management as outlined below.       Assessment and Plan:     Severe sepsis due to Multifocal Pneumonia, suspect hospital-acquired Pt was boarding in the ED awaiting discharge to SNF for 7 days prior to admission. Severe sepsis evidenced by tachypnea, leukocytosis, lactic acidosis with pulmonary and cardiac organ involvement in setting of acute hypoxic respiratory failure Has completed antibiotic course Continue mucolytics, bronchodilators and other supportive care per orders     Acute respiratory failure with hypoxia - due to PNA, pleural effusions, ?CHF --Mgmt  of underlying issues as outlined --O2 per protocol, wean as tolerated, target sats > 90%   Chronic combined systolic and diastolic CHF  History of VT (peri-CABG surgery) Abnormal EKG Elevated Troponins likely Demand Ischemia in setting of sepsis and hypoxia Hx of CAD s/p CABGx5 (Dec 2024) Echo 04/20/2023: EF ~ 20--25%, grade II DD Given Lasix 40 mg IV x 1 on admission BNP elevated 3075 (note baseline BNP appears ~2200+) Cardiology was on board but have since signed off Continue amiodarone 200 mg daily Continue low dose losartan  Continue aspirin, Plavix, Lipitor Monitor input and output Continue to monitor daily weight Has completed a course of heparin drip   Hyponatremia likely secondary to hypovolemia Encourage oral hydration Monitor sodium level closely   Bilateral pleural effusions Continue to monitor input and output --Will consider thoracentesis if needed   Normocytic anemia Monitor for signs of bleeding as well as CBC To transfuse RBC's if Hbg < 8 cardiac history   Hyperlipidemia Continue Lipitor   CAD - stable S/P CABG x 5 Continue aspirin and Lipitor    Protein-calorie malnutrition, severe Dietitian on board   Subjective:  Patient seen and examined at bedside this morning .  Denies any acute overnight complaints TOC still working on placement   Physical examination General exam: awake & alert, no acute distress, frail, chronically ill appearing HEENT: moist mucus membranes, hearing grossly normal  Respiratory system: CTAB, no wheezes, normal respiratory effort at rest on room air Cardiovascular system: normal S1/S2, RRR, no JVD, no pedal edema.   Gastrointestinal system: soft, NT,  ND Central nervous system: A&O x self and hospital. no gross focal neurologic deficits, normal speech Extremities: moves all, no edema, normal tone Psychiatry: normal mood, congruent affect   Family Communication: None present-son called but no response   Disposition: Status  is: Inpatient Remains inpatient appropriate because: severity of illness as above, on IV therapies, ongoing evaluation    Planned Discharge Destination: Patient is medically stable pending skilled nursing facility According to [facility] patient's needs TB test.  QuantiFERON test ordered however according to the lab this is only done Monday through Thursday and on Fridays before noon, case manager Delilah has been updated on this.   Data Reviewed:        Latest Ref Rng & Units 06/13/2023    2:14 AM 06/09/2023    4:28 AM 06/08/2023   10:10 AM  BMP  Glucose 70 - 99 mg/dL 95  89  73   BUN 8 - 23 mg/dL 24  22  15    Creatinine 0.61 - 1.24 mg/dL 9.14  7.82  9.56   Sodium 135 - 145 mmol/L 130  135  136   Potassium 3.5 - 5.1 mmol/L 4.7  4.8  4.2   Chloride 98 - 111 mmol/L 95  101  101   CO2 22 - 32 mmol/L 27  27  28    Calcium 8.9 - 10.3 mg/dL 8.5  8.8  8.9      Vitals:   06/15/23 1601 06/15/23 2055 06/16/23 0500 06/16/23 0750  BP: 102/64 100/60  120/77  Pulse: (!) 58 60  60  Resp: 16 15  16   Temp: 98.2 F (36.8 C) 97.9 F (36.6 C)  97.8 F (36.6 C)  TempSrc:      SpO2: 96% 95%  98%  Weight:   58.5 kg   Height:          Latest Ref Rng & Units 06/13/2023    2:14 AM 06/09/2023    4:28 AM 06/08/2023   10:10 AM  CBC  WBC 4.0 - 10.5 K/uL 6.6  6.7  6.6   Hemoglobin 13.0 - 17.0 g/dL 21.3  08.6  57.8   Hematocrit 39.0 - 52.0 % 32.7  32.8  36.2   Platelets 150 - 400 K/uL 373  378  393      Author: Loyce Dys, MD 06/16/2023 11:41 AM  For on call review [email or web address].

## 2023-06-16 NOTE — Progress Notes (Signed)
 Mobility Specialist - Progress Note   06/16/23 0853  Mobility  Activity Ambulated with assistance in hallway;Stood at bedside;Dangled on edge of bed  Level of Assistance Standby assist, set-up cues, supervision of patient - no hands on  Assistive Device Front wheel walker  Distance Ambulated (ft) 350 ft  Activity Response Tolerated well  Mobility Referral Yes  Mobility visit 1 Mobility  Mobility Specialist Start Time (ACUTE ONLY) [id number]  Mobility Specialist Stop Time (ACUTE ONLY) 0845  Mobility Specialist Time Calculation (min) (ACUTE ONLY) 24 min   Pt supine in bed on RA upon arrival. Pt completes bed mobility HHA. Pt STS from heightened bed surface CGA. Pt ambulates in hallway SBA. Pt left in recliner with needs in reach and chair alarm on.   Terrilyn Saver  Mobility Specialist  06/16/23 8:54 AM

## 2023-06-17 DIAGNOSIS — W19XXXA Unspecified fall, initial encounter: Secondary | ICD-10-CM

## 2023-06-17 DIAGNOSIS — I25118 Atherosclerotic heart disease of native coronary artery with other forms of angina pectoris: Secondary | ICD-10-CM | POA: Diagnosis not present

## 2023-06-17 DIAGNOSIS — R0609 Other forms of dyspnea: Secondary | ICD-10-CM | POA: Diagnosis not present

## 2023-06-17 DIAGNOSIS — R5381 Other malaise: Secondary | ICD-10-CM

## 2023-06-17 DIAGNOSIS — I10 Essential (primary) hypertension: Secondary | ICD-10-CM

## 2023-06-17 DIAGNOSIS — J90 Pleural effusion, not elsewhere classified: Secondary | ICD-10-CM

## 2023-06-17 DIAGNOSIS — J189 Pneumonia, unspecified organism: Secondary | ICD-10-CM | POA: Diagnosis not present

## 2023-06-17 LAB — CBC WITH DIFFERENTIAL/PLATELET
Abs Immature Granulocytes: 0.02 10*3/uL (ref 0.00–0.07)
Basophils Absolute: 0 10*3/uL (ref 0.0–0.1)
Basophils Relative: 1 %
Eosinophils Absolute: 0.2 10*3/uL (ref 0.0–0.5)
Eosinophils Relative: 4 %
HCT: 31.5 % — ABNORMAL LOW (ref 39.0–52.0)
Hemoglobin: 9.9 g/dL — ABNORMAL LOW (ref 13.0–17.0)
Immature Granulocytes: 0 %
Lymphocytes Relative: 19 %
Lymphs Abs: 1.1 10*3/uL (ref 0.7–4.0)
MCH: 28.4 pg (ref 26.0–34.0)
MCHC: 31.4 g/dL (ref 30.0–36.0)
MCV: 90.5 fL (ref 80.0–100.0)
Monocytes Absolute: 0.9 10*3/uL (ref 0.1–1.0)
Monocytes Relative: 15 %
Neutro Abs: 3.6 10*3/uL (ref 1.7–7.7)
Neutrophils Relative %: 61 %
Platelets: 329 10*3/uL (ref 150–400)
RBC: 3.48 MIL/uL — ABNORMAL LOW (ref 4.22–5.81)
RDW: 20.2 % — ABNORMAL HIGH (ref 11.5–15.5)
WBC: 5.8 10*3/uL (ref 4.0–10.5)
nRBC: 0 % (ref 0.0–0.2)

## 2023-06-17 LAB — BASIC METABOLIC PANEL WITH GFR
Anion gap: 6 (ref 5–15)
BUN: 23 mg/dL (ref 8–23)
CO2: 28 mmol/L (ref 22–32)
Calcium: 8.6 mg/dL — ABNORMAL LOW (ref 8.9–10.3)
Chloride: 101 mmol/L (ref 98–111)
Creatinine, Ser: 0.73 mg/dL (ref 0.61–1.24)
GFR, Estimated: >60 mL/min (ref >60)
Glucose, Bld: 86 mg/dL (ref 70–99)
Potassium: 4.2 mmol/L (ref 3.5–5.1)
Sodium: 135 mmol/L (ref 135–145)

## 2023-06-17 NOTE — Progress Notes (Signed)
 Mobility Specialist - Progress Note   06/17/23 0900  Mobility  Activity Ambulated with assistance in hallway;Stood at bedside;Dangled on edge of bed  Level of Assistance Standby assist, set-up cues, supervision of patient - no hands on  Assistive Device Front wheel walker  Distance Ambulated (ft) 200 ft  Activity Response Tolerated well  Mobility Referral Yes  Mobility visit 1 Mobility  Mobility Specialist Start Time (ACUTE ONLY) [id number]  Mobility Specialist Stop Time (ACUTE ONLY) 0857  Mobility Specialist Time Calculation (min) (ACUTE ONLY) 15 min   Pt supine in bed on RA upon arrival. Pt completes bed mobility indep. Pt STS and ambulates in hallway SBA. Pt returns to bed with needs in reach and bed alarm activated.   Terrilyn Saver  Mobility Specialist  06/17/23 9:01 AM

## 2023-06-17 NOTE — Progress Notes (Signed)
 Progress Note   Patient: Walter Vargas:[id number] DOB: 31-May-1945 DOA: 05/24/2023     16 DOS: the patient was seen and examined on 06/17/2023    Brief hospital course: HPI on admission 06/01/23: "Walter Vargas is a 78 year old male with history of GERD, hyperlipidemia, CAD status post CABG, on Eliquis and amiodarone, carotid enterectomy, who presents emergency department for chief concerns of falling.   Patient was pending discharge to skilled nursing facility in the ED since 05/24/2023.   On day of admission, patient was noted to have hypoxic respite failure, desatting to 80% on room air.  Patient was placed on 6 L nasal cannula with improvement to 94%.  Patient has been titrated down to 3 L nasal cannula at this time with SpO2 100% on room air.  And during hypoxic episode, patient was noted to be altered and trying to pull out leads and oxygen supplementation.  Patient is currently more redirectable.   Vitals in the ED showed temperature of 98.9, respiration rate of 26, heart rate 94, blood pressure 125/70, SpO2 of 80% on room air, currently improved to 1 high percent on 3 L nasal cannula...." See H&P for full HPI on admission & ED course.   Patient was admitted for further evaluation and management of severe sepsis due  multifocal pneumonia complicated by acute respiratory failure with hypoxia.   Further hospital course and management as outlined below.   4/6: Remained hemodynamically stable-awaiting placement.   Assessment and Plan:     Severe sepsis due to Multifocal Pneumonia, suspect hospital-acquired Pt was boarding in the ED awaiting discharge to SNF for 7 days prior to admission. Severe sepsis evidenced by tachypnea, leukocytosis, lactic acidosis with pulmonary and cardiac organ involvement in setting of acute hypoxic respiratory failure Has completed antibiotic course Continue mucolytics, bronchodilators and other supportive care per orders     Acute respiratory failure  with hypoxia - due to PNA, pleural effusions, ?CHF Has been resolved-now on room air   Chronic combined systolic and diastolic CHF  History of VT (peri-CABG surgery) Abnormal EKG Elevated Troponins likely Demand Ischemia in setting of sepsis and hypoxia Hx of CAD s/p CABGx5 (Dec 2024) Echo 04/20/2023: EF ~ 20--25%, grade II DD Given Lasix 40 mg IV x 1 on admission BNP elevated 3075 (note baseline BNP appears ~2200+) Cardiology was on board but have since signed off Continue amiodarone 200 mg daily Continue low dose losartan  Continue aspirin, Plavix, Lipitor Monitor input and output Continue to monitor daily weight Has completed a course of heparin drip   Hyponatremia likely secondary to hypovolemia Resolved   Bilateral pleural effusions Continue to monitor input and output --Will consider thoracentesis if needed   Normocytic anemia Monitor for signs of bleeding as well as CBC To transfuse RBC's if Hbg < 8 cardiac history   Hyperlipidemia Continue Lipitor   CAD - stable S/P CABG x 5 Continue aspirin and Lipitor    Protein-calorie malnutrition, severe Dietitian on board   Subjective:  Patient was sitting comfortably in chair when seen today.  Continue to have some intermittent cough.  No new concern.   Physical examination General.  Frail and malnourished elderly man, in no acute distress. Pulmonary.  Lungs clear bilaterally, normal respiratory effort. CV.  Regular rate and rhythm, no JVD, rub or murmur. Abdomen.  Soft, nontender, nondistended, BS positive. CNS.  Alert and oriented .  No focal neurologic deficit. Extremities.  No edema, no cyanosis, pulses intact and symmetrical.   Family Communication:  No family at bedside   Disposition: Status is: Inpatient Remains inpatient appropriate because: severity of illness as above, on IV therapies, ongoing evaluation    Planned Discharge Destination: Patient is medically stable pending skilled nursing  facility According to [facility] patient's needs TB test.  QuantiFERON test ordered however according to the lab this is only done Monday through Thursday and on Fridays before noon, case manager Delilah has been updated on this.   Data Reviewed:        Latest Ref Rng & Units 06/17/2023    5:11 AM 06/13/2023    2:14 AM 06/09/2023    4:28 AM  BMP  Glucose 70 - 99 mg/dL 86  95  89   BUN 8 - 23 mg/dL 23  24  22    Creatinine 0.61 - 1.24 mg/dL 2.95  6.21  3.08   Sodium 135 - 145 mmol/L 135  130  135   Potassium 3.5 - 5.1 mmol/L 4.2  4.7  4.8   Chloride 98 - 111 mmol/L 101  95  101   CO2 22 - 32 mmol/L 28  27  27    Calcium 8.9 - 10.3 mg/dL 8.6  8.5  8.8      Vitals:   06/16/23 2001 06/17/23 0329 06/17/23 0500 06/17/23 0817  BP: (!) 113/58 (!) 105/59  136/78  Pulse:  (!) 56  (!) 57  Resp: 16 16  20   Temp: 97.9 F (36.6 C) 98.4 F (36.9 C)  97.8 F (36.6 C)  TempSrc:  Oral    SpO2: 100% 99%  100%  Weight:   57.4 kg   Height:          Latest Ref Rng & Units 06/17/2023    5:11 AM 06/13/2023    2:14 AM 06/09/2023    4:28 AM  CBC  WBC 4.0 - 10.5 K/uL 5.8  6.6  6.7   Hemoglobin 13.0 - 17.0 g/dL 9.9  65.7  84.6   Hematocrit 39.0 - 52.0 % 31.5  32.7  32.8   Platelets 150 - 400 K/uL 329  373  378      Author: Arnetha Courser, MD 06/17/2023 1:53 PM  For on call review [email or web address].

## 2023-06-18 DIAGNOSIS — J9601 Acute respiratory failure with hypoxia: Secondary | ICD-10-CM

## 2023-06-18 DIAGNOSIS — W19XXXA Unspecified fall, initial encounter: Secondary | ICD-10-CM | POA: Diagnosis not present

## 2023-06-18 DIAGNOSIS — J189 Pneumonia, unspecified organism: Secondary | ICD-10-CM | POA: Diagnosis not present

## 2023-06-18 DIAGNOSIS — I25118 Atherosclerotic heart disease of native coronary artery with other forms of angina pectoris: Secondary | ICD-10-CM | POA: Diagnosis not present

## 2023-06-18 NOTE — TOC Progression Note (Signed)
 Transition of Care [facility]) - Progression Note    Patient Details  Name: BIRT REINOSO MRN: [id number] Date of Birth: March 28, 1945  [facility]) CM/SW Contact  Marlowe Sax, RN Phone Number: 06/18/2023, 3:58 PM  Clinical Narrative:     Reached out to Duwayne Heck at [facility] asking for an update on finding ALF, waiting on a response  Expected Discharge Plan: Skilled Nursing Facility Barriers to Discharge: Continued Medical Work up  Expected Discharge Plan and Services       Living arrangements for the past 2 months: Single Family Home                                       Social Determinants of Health (SDOH) Interventions SDOH Screenings   Food Insecurity: No Food Insecurity (06/01/2023)  Housing: Low Risk  (06/01/2023)  Transportation Needs: No Transportation Needs (06/01/2023)  Utilities: Not At Risk (06/01/2023)  Depression (PHQ2-9): Low Risk  (10/19/2022)  Social Connections: Socially Isolated (06/01/2023)  Tobacco Use: Medium Risk (06/01/2023)    Readmission Risk Interventions    03/20/2023   12:12 PM  Readmission Risk Prevention Plan  Transportation Screening Complete  Medication Review Oceanographer) Complete  HRI or Home Care Consult Complete  SW Recovery Care/Counseling Consult Complete  Palliative Care Screening Not Applicable  Skilled Nursing Facility Not Applicable

## 2023-06-18 NOTE — NC FL2 (Signed)
 [place] MEDICAID FL2 LEVEL OF CARE FORM     IDENTIFICATION  Patient Name: Walter Vargas Birthdate: Jun 22, 1945 Sex: male Admission Date (Current Location): 05/24/2023  [facility] and [place] Number:  Chiropodist and Address:  [facility], [address] [place], [place] [address]      Provider Number: [id number]  Attending Physician Name and Address:  Arnetha Courser, MD  Relative Name and Phone Number:  Remi Deter [name]) Ether Griffins  Son  Emergency Contact  [phone]    Current Level of Care: Hospital Recommended Level of Care: Assisted Living Facility Prior Approval Number:    Date Approved/Denied:   PASRR Number: [id number]  Discharge Plan: Other (Comment) (ALF)    Current Diagnoses: Patient Active Problem List   Diagnosis Date Noted   Fall 06/06/2023   Acute respiratory failure with hypoxia (HCC) 06/06/2023   Pelvic pain 06/06/2023   Ischemic cardiomyopathy 06/03/2023   Pneumonia 06/01/2023   Essential hypertension 06/01/2023   CAD (coronary artery disease) 06/01/2023   Severe sepsis (HCC) 06/01/2023   VT (ventricular tachycardia) (HCC) 06/01/2023   Hyperlipidemia 06/01/2023   Elevated troponin 06/01/2023   Status post carotid endarterectomy 04/25/2023   Acute ischemic left middle cerebral artery (MCA) stroke (HCC) 04/19/2023   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, without accompanying behavioral or psychological disturbance (HCC) 04/04/2023   Pressure injury of skin 03/20/2023   Debility 03/20/2023   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   Postoperative shock, cardiogenic (HCC) 03/09/2023   Protein-calorie malnutrition, severe 03/03/2023   Cardiogenic shock (HCC) 03/01/2023   COVID-19 virus infection 02/18/2023   Chronic combined systolic and diastolic CHF (congestive heart failure) (HCC) 02/14/2023   AKI (acute kidney injury) (HCC) 02/13/2023   Acute exacerbation of CHF (congestive heart failure) (HCC) 02/10/2023    Non-ST elevation (NSTEMI) myocardial infarction (HCC) 02/10/2023   Thickening of wall of gallbladder 02/10/2023   Elevated LFTs 02/10/2023   Pleural effusion 10/19/2022   Elevated brain natriuretic peptide (BNP) level 10/19/2022   Dyspnea on exertion 10/12/2022   Lower extremity edema 10/12/2022   Elevated blood pressure reading 06/21/2022   Callus 06/21/2022   Umbilical hernia without obstruction and without gangrene 06/21/2022   Impacted cerumen of right ear 05/26/2021   Chronic pain of both knees 05/26/2021   Preventative health care 05/26/2021   Screening for colon cancer 05/26/2021    Orientation RESPIRATION BLADDER Height & Weight     Self, Place  Normal Continent Weight: 57.9 kg Height:  5\' 8"  (172.7 cm)  BEHAVIORAL SYMPTOMS/MOOD NEUROLOGICAL BOWEL NUTRITION STATUS  Other (Comment) (None)  (None) Continent Diet (low sodium)  AMBULATORY STATUS COMMUNICATION OF NEEDS Skin   Supervision Verbally Normal   PU Stage 2 Dressing: Daily (Coccyx: Foam daily per documentation from February admission.)                   Personal Care Assistance Level of Assistance  Bathing, Feeding, Dressing Bathing Assistance: Limited assistance Feeding assistance: Limited assistance Dressing Assistance: Limited assistance     Functional Limitations Info  Sight, Hearing, Speech Sight Info: Adequate Hearing Info: Adequate Speech Info: Adequate    SPECIAL CARE FACTORS FREQUENCY  PT (By licensed PT), OT (By licensed OT)     PT Frequency: 5 x week OT Frequency: 5 x week            Contractures Contractures Info: Not present    Additional Factors Info  Code Status, Allergies Code Status Info: Full code Allergies  Info: NKDA           Current Medications (06/18/2023):  This is the current hospital active medication list Current Facility-Administered Medications  Medication Dose Route Frequency Provider Last Rate Last Admin   amiodarone (PACERONE) tablet 100 mg  100 mg Oral  Daily Rosezetta Schlatter T, MD   100 mg at 06/18/23 7829   ascorbic acid (VITAMIN C) tablet 500 mg  500 mg Oral BID Esaw Grandchild A, DO   500 mg at 06/18/23 5621   aspirin chewable tablet 81 mg  81 mg Oral Daily Cox, Amy N, DO   81 mg at 06/18/23 0933   dapagliflozin propanediol (FARXIGA) tablet 10 mg  10 mg Oral Daily Lorine Bears A, MD   10 mg at 06/18/23 0933   feeding supplement (ENSURE ENLIVE / ENSURE PLUS) liquid 237 mL  237 mL Oral BID BM Esaw Grandchild A, DO   237 mL at 06/18/23 0935   furosemide (LASIX) tablet 20 mg  20 mg Oral Daily Agbor-Etang, Arlys John, MD   20 mg at 06/18/23 0935   guaiFENesin-dextromethorphan (ROBITUSSIN DM) 100-10 MG/5ML syrup 5 mL  5 mL Oral Q4H PRN Rosezetta Schlatter T, MD   5 mL at 06/10/23 0919   hydrOXYzine (ATARAX) tablet 25 mg  25 mg Oral TID PRN Cox, Amy N, DO   25 mg at 06/14/23 1105   losartan (COZAAR) tablet 25 mg  25 mg Oral Daily Lorine Bears A, MD   25 mg at 06/18/23 0933   multivitamin with minerals tablet 1 tablet  1 tablet Oral Daily Esaw Grandchild A, DO   1 tablet at 06/18/23 0933   pantoprazole (PROTONIX) EC tablet 40 mg  40 mg Oral BID AC Cox, Amy N, DO   40 mg at 06/18/23 0933   polyethylene glycol (MIRALAX / GLYCOLAX) packet 17 g  17 g Oral Daily Esaw Grandchild A, DO   17 g at 06/18/23 0933   senna-docusate (Senokot-S) tablet 1 tablet  1 tablet Oral BID Esaw Grandchild A, DO   1 tablet at 06/18/23 0933   traMADol (ULTRAM) tablet 50 mg  50 mg Oral Q6H PRN Loyce Dys, MD   50 mg at 06/15/23 2108     Discharge Medications: Please see discharge summary for a list of discharge medications.  Relevant Imaging Results:  Relevant Lab Results:   Additional Information SS#: [id number]. looing for private pay memory care  Marlowe Sax, RN

## 2023-06-18 NOTE — TOC Progression Note (Signed)
 Transition of Care [facility]) - Progression Note    Patient Details  Name: Walter Vargas MRN: [id number] Date of Birth: Sep 12, 1945  [facility]) CM/SW Contact  Marlowe Sax, RN Phone Number: 06/18/2023, 4:40 PM  Clinical Narrative:     Facilities coming to assess tomorrow and will let us know if he can go to them  Expected Discharge Plan: Skilled Nursing Facility Barriers to Discharge: Continued Medical Work up  Expected Discharge Plan and Services       Living arrangements for the past 2 months: Single Family Home                                       Social Determinants of Health (SDOH) Interventions SDOH Screenings   Food Insecurity: No Food Insecurity (06/01/2023)  Housing: Low Risk  (06/01/2023)  Transportation Needs: No Transportation Needs (06/01/2023)  Utilities: Not At Risk (06/01/2023)  Depression (PHQ2-9): Low Risk  (10/19/2022)  Social Connections: Socially Isolated (06/01/2023)  Tobacco Use: Medium Risk (06/01/2023)    Readmission Risk Interventions    03/20/2023   12:12 PM  Readmission Risk Prevention Plan  Transportation Screening Complete  Medication Review Oceanographer) Complete  HRI or Home Care Consult Complete  SW Recovery Care/Counseling Consult Complete  Palliative Care Screening Not Applicable  Skilled Nursing Facility Not Applicable

## 2023-06-18 NOTE — Progress Notes (Signed)
 Occupational Therapy Treatment Patient Details Name: Walter Vargas MRN: [id number] DOB: February 18, 1946 Today's Date: 06/18/2023   History of present illness 78 year old male with history of GERD, hyperlipidemia, CAD status post CABG, on Eliquis and amiodarone, carotid enterectomy, who presented to emergency department after a fall.  Patient was in the ED from 05/24/2023 -06/01/23 pending discharge to skilled nursing facility, but was noted to have hypoxic respiratory failure, desatting to 80% on room air.  Now admitted with pneumonia.   OT comments  Pt seen for OT tx. Pt in recliner, reporting a friend is coming soon for a visit. Pt initially hesitant to participate due to upcoming visit but with encouragement agreeable. Pt required increased assist this date for ADL transfers and mobility 2/2 posterior lean with standing and difficulty maintaining balance while turning. Pt demonstrating poor safety awareness and mildly impulsive requiring VC for safety and MIN-MOD A to recover from LOB while turning with the RW. PT continues to benefit from skilled OT services.       If plan is discharge home, recommend the following:  A little help with walking and/or transfers;A little help with bathing/dressing/bathroom;Assistance with cooking/housework;Assist for transportation;Help with stairs or ramp for entrance;Direct supervision/assist for financial management;Direct supervision/assist for medications management;Supervision due to cognitive status   Equipment Recommendations  Other (comment) (defer)    Recommendations for Other Services      Precautions / Restrictions Precautions Precautions: Fall Recall of Precautions/Restrictions: Impaired Precaution/Restrictions Comments: mildly impulsive; sternal precautions Restrictions Weight Bearing Restrictions Per Provider Order: Yes RUE Weight Bearing Per Provider Order: Weight bearing as tolerated LUE Weight Bearing Per Provider Order: Weight bearing as  tolerated Other Position/Activity Restrictions: sternal precautions- unsure how long cardiologist likes precautions to be adhered to       Mobility Bed Mobility               General bed mobility comments: Deferred (pt in recliner beginning/end of session)    Transfers Overall transfer level: Needs assistance Equipment used: Rolling walker (2 wheels) Transfers: Sit to/from Stand Sit to Stand: Min assist           General transfer comment: MIN A to prevent posterior LOB and VC for anterior weight shift and hands placement     Balance Overall balance assessment: Needs assistance Sitting-balance support: No upper extremity supported, Feet supported Sitting balance-Leahy Scale: Good   Postural control: Posterior lean Standing balance support: During functional activity, Bilateral upper extremity supported, Reliant on assistive device for balance Standing balance-Leahy Scale: Poor Standing balance comment: MIN-MOD A to correct for LOB while turning                           ADL either performed or assessed with clinical judgement   ADL Overall ADL's : Needs assistance/impaired                                     Functional mobility during ADLs: Rolling walker (2 wheels);Cueing for safety;Contact guard assist;Moderate assistance;Minimal assistance General ADL Comments: CGA-MIN A for ADL mobility with MIN-MOD A required to prevent 1 LOB while turning with RW with feet crossing over each other.    Extremity/Trunk Assessment              Diplomatic Services operational officer  Cognition Arousal: Alert Behavior During Therapy: Impulsive Cognition: No family/caregiver present to determine baseline             OT - Cognition Comments: Poor safety awareness, a bit impulsive, increased processing time, requires cues to redirect and improve safety                 Following commands: Impaired Following  commands impaired: Follows one step commands with increased time, Follows multi-step commands inconsistently      Cueing   Cueing Techniques: Verbal cues, Tactile cues  Exercises      Shoulder Instructions       General Comments      Pertinent Vitals/ Pain          Home Living                                          Prior Functioning/Environment              Frequency  Min 2X/week        Progress Toward Goals  OT Goals(current goals can now be found in the care plan section)  Progress towards OT goals: Progressing toward goals  Acute Rehab OT Goals Patient Stated Goal: return home OT Goal Formulation: With patient Time For Goal Achievement: 06/20/23 Potential to Achieve Goals: Fair  Plan      Co-evaluation                 AM-PAC OT "6 Clicks" Daily Activity     Outcome Measure   Help from another person eating meals?: None Help from another person taking care of personal grooming?: A Little Help from another person toileting, which includes using toliet, bedpan, or urinal?: A Little Help from another person bathing (including washing, rinsing, drying)?: A Little Help from another person to put on and taking off regular upper body clothing?: None Help from another person to put on and taking off regular lower body clothing?: A Little 6 Click Score: 20    End of Session Equipment Utilized During Treatment: Rolling walker (2 wheels)  OT Visit Diagnosis: Unsteadiness on feet (R26.81);Other abnormalities of gait and mobility (R26.89);Muscle weakness (generalized) (M62.81);Other symptoms and signs involving cognitive function   Activity Tolerance     Patient Left in chair;with call bell/phone within reach;with chair alarm set   Nurse Communication          Time: 1610-9604 OT Time Calculation (min): 11 min  Charges: OT General Charges $OT Visit: 1 Visit OT Treatments $Therapeutic Activity: 8-22 mins  Arman Filter., MPH,  MS, OTR/L ascom [phone] 06/18/23, 4:09 PM

## 2023-06-18 NOTE — Progress Notes (Signed)
 Progress Note   Patient: Walter Vargas ZOX:[id number] DOB: 09-Feb-1946 DOA: 05/24/2023     17 DOS: the patient was seen and examined on 06/18/2023    Brief hospital course: HPI on admission 06/01/23: "Walter Vargas is a 78 year old male with history of GERD, hyperlipidemia, CAD status post CABG, on Eliquis and amiodarone, carotid enterectomy, who presents emergency department for chief concerns of falling.   Patient was pending discharge to skilled nursing facility in the ED since 05/24/2023.   On day of admission, patient was noted to have hypoxic respite failure, desatting to 80% on room air.  Patient was placed on 6 L nasal cannula with improvement to 94%.  Patient has been titrated down to 3 L nasal cannula at this time with SpO2 100% on room air.  And during hypoxic episode, patient was noted to be altered and trying to pull out leads and oxygen supplementation.  Patient is currently more redirectable.   Vitals in the ED showed temperature of 98.9, respiration rate of 26, heart rate 94, blood pressure 125/70, SpO2 of 80% on room air, currently improved to 1 high percent on 3 L nasal cannula...." See H&P for full HPI on admission & ED course.   Patient was admitted for further evaluation and management of severe sepsis due  multifocal pneumonia complicated by acute respiratory failure with hypoxia.   Further hospital course and management as outlined below.   4/6: Remained hemodynamically stable-awaiting placement.  4/7: TB test was done today, hoping he can go in next couple of days.   Assessment and Plan:     Severe sepsis due to Multifocal Pneumonia, suspect hospital-acquired Pt was boarding in the ED awaiting discharge to SNF for 7 days prior to admission. Severe sepsis evidenced by tachypnea, leukocytosis, lactic acidosis with pulmonary and cardiac organ involvement in setting of acute hypoxic respiratory failure Has completed antibiotic course Continue mucolytics, bronchodilators  and other supportive care per orders     Acute respiratory failure with hypoxia - due to PNA, pleural effusions, ?CHF Has been resolved-now on room air   Chronic combined systolic and diastolic CHF  History of VT (peri-CABG surgery) Abnormal EKG Elevated Troponins likely Demand Ischemia in setting of sepsis and hypoxia Hx of CAD s/p CABGx5 (Dec 2024) Echo 04/20/2023: EF ~ 20--25%, grade II DD Given Lasix 40 mg IV x 1 on admission BNP elevated 3075 (note baseline BNP appears ~2200+) Cardiology was on board but have since signed off Continue amiodarone 200 mg daily Continue low dose losartan  Continue aspirin, Plavix, Lipitor Monitor input and output Continue to monitor daily weight Has completed a course of heparin drip   Hyponatremia likely secondary to hypovolemia Resolved   Bilateral pleural effusions Continue to monitor input and output --Will consider thoracentesis if needed   Normocytic anemia Monitor for signs of bleeding as well as CBC To transfuse RBC's if Hbg < 8 cardiac history   Hyperlipidemia Continue Lipitor   CAD - stable S/P CABG x 5 Continue aspirin and Lipitor    Protein-calorie malnutrition, severe Dietitian on board   Subjective:  Patient was eating breakfast comfortably when seen today.  He was asking for more coffee.  No other concern.   Physical examination General.  Frail and malnourished elderly man, in no acute distress. Pulmonary.  Lungs clear bilaterally, normal respiratory effort. CV.  Regular rate and rhythm, no JVD, rub or murmur. Abdomen.  Soft, nontender, nondistended, BS positive. CNS.  Alert and oriented .  No focal neurologic  deficit. Extremities.  No edema, no cyanosis, pulses intact and symmetrical.    Family Communication: Tried calling son with no response   Disposition: Status is: Inpatient Remains inpatient appropriate because: Needing placement    Planned Discharge Destination: Patient is medically stable pending  skilled nursing facility According to [facility] patient's needs TB test.  QuantiFERON test done today with pending results  Data Reviewed:        Latest Ref Rng & Units 06/17/2023    5:11 AM 06/13/2023    2:14 AM 06/09/2023    4:28 AM  BMP  Glucose 70 - 99 mg/dL 86  95  89   BUN 8 - 23 mg/dL 23  24  22    Creatinine 0.61 - 1.24 mg/dL 1.61  0.96  0.45   Sodium 135 - 145 mmol/L 135  130  135   Potassium 3.5 - 5.1 mmol/L 4.2  4.7  4.8   Chloride 98 - 111 mmol/L 101  95  101   CO2 22 - 32 mmol/L 28  27  27    Calcium 8.9 - 10.3 mg/dL 8.6  8.5  8.8      Vitals:   06/17/23 2004 06/18/23 0328 06/18/23 0445 06/18/23 0826  BP: 110/70 (!) 100/52  118/70  Pulse: 65 (!) 57  (!) 57  Resp: 19 19  20   Temp: 98.2 F (36.8 C) 98.3 F (36.8 C)  97.8 F (36.6 C)  TempSrc: Oral Oral    SpO2: 98% 99%  100%  Weight:   57.9 kg   Height:          Latest Ref Rng & Units 06/17/2023    5:11 AM 06/13/2023    2:14 AM 06/09/2023    4:28 AM  CBC  WBC 4.0 - 10.5 K/uL 5.8  6.6  6.7   Hemoglobin 13.0 - 17.0 g/dL 9.9  40.9  81.1   Hematocrit 39.0 - 52.0 % 31.5  32.7  32.8   Platelets 150 - 400 K/uL 329  373  378      Author: Arnetha Courser, MD 06/18/2023 1:33 PM  For on call review [email or web address].

## 2023-06-18 NOTE — Progress Notes (Signed)
 Physical Therapy Treatment Patient Details Name: Walter Vargas MRN: [id number] DOB: [birth year] Today's Date: 06/18/2023   History of Present Illness 78 year old male with history of GERD, hyperlipidemia, CAD status post CABG, on Eliquis and amiodarone, carotid enterectomy, who presented to emergency department after a fall.  Patient was in the ED from 05/24/2023 -06/01/23 pending discharge to skilled nursing facility, but was noted to have hypoxic respiratory failure, desatting to 80% on room air.  Now admitted with pneumonia.    PT Comments  Pt resting in recliner upon PT arrival; pt agreeable to therapy but didn't want to walk far d/t he was waiting for a phone call.  During session pt was CGA with transfers (x6 trials from recliner) and ambulation 180 feet with RW use.  Pt appearing impulsive during activities requiring assist for safety.  Will continue to focus on strengthening, balance, and progressive functional mobility during hospitalization.    If plan is discharge home, recommend the following: A little help with walking and/or transfers;Assistance with cooking/housework;Direct supervision/assist for medications management;Direct supervision/assist for financial management;Assist for transportation;Help with stairs or ramp for entrance;Supervision due to cognitive status   Can travel by private vehicle     Yes  Equipment Recommendations  Rolling walker (2 wheels)    Recommendations for Other Services       Precautions / Restrictions Precautions Precautions: Fall Recall of Precautions/Restrictions: Impaired Precaution/Restrictions Comments: mildly impulsive; sternal precautions Restrictions Weight Bearing Restrictions Per Provider Order: Yes RUE Weight Bearing Per Provider Order: Weight bearing as tolerated LUE Weight Bearing Per Provider Order: Weight bearing as tolerated Other Position/Activity Restrictions: sternal precautions- unsure how long cardiologist likes precautions to  be adhered to     Mobility  Bed Mobility               General bed mobility comments: Deferred (pt in recliner beginning/end of session)    Transfers Overall transfer level: Needs assistance Equipment used: Rolling walker (2 wheels) Transfers: Sit to/from Stand Sit to Stand: Contact guard assist           General transfer comment: vc's for UE/LE placement and to shift weight forward when standing (pt tending to stand with weight through his heels causing more posterior posture)    Ambulation/Gait Ambulation/Gait assistance: Contact guard assist Gait Distance (Feet): 180 Feet Assistive device: Rolling walker (2 wheels) Gait Pattern/deviations: Step-through pattern Gait velocity: increased     General Gait Details: steady ambulation with RW use although pt appearing a bit impulsive intermittently   Stairs             Wheelchair Mobility     Tilt Bed    Modified Rankin (Stroke Patients Only)       Balance Overall balance assessment: Needs assistance Sitting-balance support: No upper extremity supported, Feet supported Sitting balance-Leahy Scale: Good Sitting balance - Comments: steady reaching within BOS   Standing balance support: During functional activity, Bilateral upper extremity supported Standing balance-Leahy Scale: Fair Standing balance comment: pt tending to be impulsive with movement (causing concerns for fall risk)                            Communication Communication Communication: No apparent difficulties  Cognition Arousal: Alert Behavior During Therapy: Impulsive   PT - Cognitive impairments: History of cognitive impairments   Orientation impairments: Time, Situation  Following commands: Intact Following commands impaired: Follows one step commands with increased time    Cueing Cueing Techniques: Verbal cues, Tactile cues  Exercises General Exercises - Lower Extremity Long Arc  Quad: AROM, Strengthening, Both, 10 reps, Seated Hip Flexion/Marching: AROM, Strengthening, Both, 10 reps, Seated    General Comments  Pt agreeable to PT session.      Pertinent Vitals/Pain Pain Assessment Pain Assessment: No/denies pain Pain Intervention(s): Limited activity within patient's tolerance, Monitored during session Vitals (HR and SpO2 on room air) stable throughout treatment session.    Home Living                          Prior Function            PT Goals (current goals can now be found in the care plan section) Acute Rehab PT Goals Patient Stated Goal: to improve strength PT Goal Formulation: With patient Time For Goal Achievement: 07/02/23 Potential to Achieve Goals: Good Progress towards PT goals: Progressing toward goals    Frequency    Min 2X/week      PT Plan      Co-evaluation              AM-PAC PT "6 Clicks" Mobility   Outcome Measure  Help needed turning from your back to your side while in a flat bed without using bedrails?: A Little Help needed moving from lying on your back to sitting on the side of a flat bed without using bedrails?: A Little Help needed moving to and from a bed to a chair (including a wheelchair)?: A Little Help needed standing up from a chair using your arms (e.g., wheelchair or bedside chair)?: A Little Help needed to walk in hospital room?: A Little Help needed climbing 3-5 steps with a railing? : A Little 6 Click Score: 18    End of Session Equipment Utilized During Treatment: Gait belt Activity Tolerance: Patient tolerated treatment well Patient left: in chair;with call bell/phone within reach;with chair alarm set Nurse Communication: Mobility status;Precautions PT Visit Diagnosis: Other abnormalities of gait and mobility (R26.89);Difficulty in walking, not elsewhere classified (R26.2);Muscle weakness (generalized) (M62.81);Unsteadiness on feet (R26.81);Repeated falls (R29.6)     Time:  0272-5366 PT Time Calculation (min) (ACUTE ONLY): 14 min  Charges:    $Therapeutic Activity: 8-22 mins PT General Charges $$ ACUTE PT VISIT: 1 Visit                     Hendricks Limes, PT 06/18/23, 12:19 PM

## 2023-06-19 ENCOUNTER — Other Ambulatory Visit: Payer: Self-pay | Admitting: Thoracic Surgery (Cardiothoracic Vascular Surgery)

## 2023-06-19 DIAGNOSIS — W19XXXA Unspecified fall, initial encounter: Secondary | ICD-10-CM | POA: Diagnosis not present

## 2023-06-19 DIAGNOSIS — J9601 Acute respiratory failure with hypoxia: Secondary | ICD-10-CM | POA: Diagnosis not present

## 2023-06-19 DIAGNOSIS — Z951 Presence of aortocoronary bypass graft: Secondary | ICD-10-CM

## 2023-06-19 DIAGNOSIS — J189 Pneumonia, unspecified organism: Secondary | ICD-10-CM | POA: Diagnosis not present

## 2023-06-19 DIAGNOSIS — I25118 Atherosclerotic heart disease of native coronary artery with other forms of angina pectoris: Secondary | ICD-10-CM | POA: Diagnosis not present

## 2023-06-19 NOTE — Plan of Care (Signed)

## 2023-06-19 NOTE — Progress Notes (Signed)
 Progress Note   Patient: Walter Vargas UKG:[id number] DOB: 1946/02/06 DOA: 05/24/2023     18 DOS: the patient was seen and examined on 06/19/2023    Brief hospital course: HPI on admission 06/01/23: "Angelina Neece is a 78 year old male with history of GERD, hyperlipidemia, CAD status post CABG, on Eliquis and amiodarone, carotid enterectomy, who presents emergency department for chief concerns of falling.   Patient was pending discharge to skilled nursing facility in the ED since 05/24/2023.   On day of admission, patient was noted to have hypoxic respite failure, desatting to 80% on room air.  Patient was placed on 6 L nasal cannula with improvement to 94%.  Patient has been titrated down to 3 L nasal cannula at this time with SpO2 100% on room air.  And during hypoxic episode, patient was noted to be altered and trying to pull out leads and oxygen supplementation.  Patient is currently more redirectable.   Vitals in the ED showed temperature of 98.9, respiration rate of 26, heart rate 94, blood pressure 125/70, SpO2 of 80% on room air, currently improved to 1 high percent on 3 L nasal cannula...." See H&P for full HPI on admission & ED course.   Patient was admitted for further evaluation and management of severe sepsis due  multifocal pneumonia complicated by acute respiratory failure with hypoxia.   Further hospital course and management as outlined below.   4/6: Remained hemodynamically stable-awaiting placement.  4/7: TB test was done today, hoping he can go in next couple of days.  4/8: QuantiFERON-TB test results are pending, facility is coming over today to evaluate.   Assessment and Plan:     Severe sepsis due to Multifocal Pneumonia, suspect hospital-acquired Pt was boarding in the ED awaiting discharge to SNF for 7 days prior to admission. Severe sepsis evidenced by tachypnea, leukocytosis, lactic acidosis with pulmonary and cardiac organ involvement in setting of acute hypoxic  respiratory failure Has completed antibiotic course Continue mucolytics, bronchodilators and other supportive care per orders     Acute respiratory failure with hypoxia - due to PNA, pleural effusions, ?CHF Has been resolved-now on room air   Chronic combined systolic and diastolic CHF  History of VT (peri-CABG surgery) Abnormal EKG Elevated Troponins likely Demand Ischemia in setting of sepsis and hypoxia Hx of CAD s/p CABGx5 (Dec 2024) Echo 04/20/2023: EF ~ 20--25%, grade II DD Given Lasix 40 mg IV x 1 on admission BNP elevated 3075 (note baseline BNP appears ~2200+) Cardiology was on board but have since signed off Continue amiodarone 200 mg daily Continue low dose losartan  Continue aspirin, Plavix, Lipitor Monitor input and output Continue to monitor daily weight Has completed a course of heparin drip   Hyponatremia likely secondary to hypovolemia Resolved   Bilateral pleural effusions Continue to monitor input and output --Will consider thoracentesis if needed   Normocytic anemia Monitor for signs of bleeding as well as CBC To transfuse RBC's if Hbg < 8 cardiac history   Hyperlipidemia Continue Lipitor   CAD - stable S/P CABG x 5 Continue aspirin and Lipitor    Protein-calorie malnutrition, severe Dietitian on board   Subjective:  Patient with no new concern or complaints.   Physical examination General.  Frail and malnourished elderly man, in no acute distress. Pulmonary.  Lungs clear bilaterally, normal respiratory effort. CV.  Regular rate and rhythm, no JVD, rub or murmur. Abdomen.  Soft, nontender, nondistended, BS positive. CNS.  Alert and oriented .  No  focal neurologic deficit. Extremities.  No edema, no cyanosis, pulses intact and symmetrical.   Family Communication: Tried calling son with no response   Disposition: Status is: Inpatient Remains inpatient appropriate because: Needing placement    Planned Discharge Destination: Patient is  medically stable pending skilled nursing facility According to [facility] patient's needs TB test.  QuantiFERON test done today with pending results  Data Reviewed:        Latest Ref Rng & Units 06/17/2023    5:11 AM 06/13/2023    2:14 AM 06/09/2023    4:28 AM  BMP  Glucose 70 - 99 mg/dL 86  95  89   BUN 8 - 23 mg/dL 23  24  22    Creatinine 0.61 - 1.24 mg/dL 8.46  9.62  9.52   Sodium 135 - 145 mmol/L 135  130  135   Potassium 3.5 - 5.1 mmol/L 4.2  4.7  4.8   Chloride 98 - 111 mmol/L 101  95  101   CO2 22 - 32 mmol/L 28  27  27    Calcium 8.9 - 10.3 mg/dL 8.6  8.5  8.8      Vitals:   06/18/23 1700 06/18/23 1937 06/19/23 0441 06/19/23 0759  BP: (!) 98/55 (!) 108/58 (!) 102/59 115/71  Pulse:  63 (!) 54 (!) 55  Resp:  18 18 16   Temp:  97.9 F (36.6 C) 97.8 F (36.6 C) (!) 97.5 F (36.4 C)  TempSrc:  Oral Oral   SpO2:  99% 94% 100%  Weight:      Height:          Latest Ref Rng & Units 06/17/2023    5:11 AM 06/13/2023    2:14 AM 06/09/2023    4:28 AM  CBC  WBC 4.0 - 10.5 K/uL 5.8  6.6  6.7   Hemoglobin 13.0 - 17.0 g/dL 9.9  84.1  32.4   Hematocrit 39.0 - 52.0 % 31.5  32.7  32.8   Platelets 150 - 400 K/uL 329  373  378      Author: Arnetha Courser, MD 06/19/2023 1:47 PM  For on call review [email or web address].

## 2023-06-20 DIAGNOSIS — W19XXXA Unspecified fall, initial encounter: Secondary | ICD-10-CM | POA: Diagnosis not present

## 2023-06-20 DIAGNOSIS — J9601 Acute respiratory failure with hypoxia: Secondary | ICD-10-CM | POA: Diagnosis not present

## 2023-06-20 DIAGNOSIS — J189 Pneumonia, unspecified organism: Secondary | ICD-10-CM | POA: Diagnosis not present

## 2023-06-20 DIAGNOSIS — I25118 Atherosclerotic heart disease of native coronary artery with other forms of angina pectoris: Secondary | ICD-10-CM | POA: Diagnosis not present

## 2023-06-20 LAB — QUANTIFERON-TB GOLD PLUS (RQFGPL)
QuantiFERON Mitogen Value: >10 [IU]/mL
QuantiFERON Nil Value: 0.05 [IU]/mL
QuantiFERON TB1 Ag Value: 0.06 [IU]/mL
QuantiFERON TB2 Ag Value: 0.06 [IU]/mL

## 2023-06-20 LAB — QUANTIFERON-TB GOLD PLUS: QuantiFERON-TB Gold Plus: NEGATIVE

## 2023-06-20 NOTE — Progress Notes (Addendum)
 Occupational Therapy Re-evaluation Patient Details Name: Walter Vargas MRN: [id number] DOB: 1945/09/29 Today's Date: 06/20/2023   History of present illness 78 year old male with history of GERD, hyperlipidemia, CAD status post CABG, on Eliquis and amiodarone, carotid enterectomy, who presented to emergency department after a fall.  Patient was in the ED from 05/24/2023 -06/01/23 pending discharge to skilled nursing facility, but was noted to have hypoxic respiratory failure, desatting to 80% on room air.  Now admitted with pneumonia.   OT comments  Chart reviewed to date, pt greeted in chair. He is oriented to self and place, when asked why he is here he reports "I get asked that so many times, it gets boring" but does not provide reasoning to this therapist. Increased time for one step direction following with mild impulsivity noted. Pt is in agreement to OT tx session targeting improving functional activity tolerance in the setting of ADL tasks to improve ADL performance. Improvements noted during functional amb to bathroom on this date with pt amb with RW with CGA, no LOB with toilet transfer/directional changes on this date. MAX A required fro peri care following continent BM on toilet, CGA for grooming tasks standing at sink level.Goals reviewed and remain appropriate. Pt is left as received, all needs met. OT will continue to follow acutely.       If plan is discharge home, recommend the following:  A little help with walking and/or transfers;A little help with bathing/dressing/bathroom;Assistance with cooking/housework;Assist for transportation;Help with stairs or ramp for entrance;Direct supervision/assist for financial management;Direct supervision/assist for medications management;Supervision due to cognitive status   Equipment Recommendations  Other (comment) (defer to next venue of care)    Recommendations for Other Services      Precautions / Restrictions Precautions Precautions:  Fall Recall of Precautions/Restrictions: Impaired       Mobility Bed Mobility               General bed mobility comments: NT in recliner pre/post session    Transfers Overall transfer level: Needs assistance Equipment used: Rolling walker (2 wheels) Transfers: Sit to/from Stand Sit to Stand: Contact guard assist, Min assist           General transfer comment: multiple attempts     Balance Overall balance assessment: Needs assistance Sitting-balance support: No upper extremity supported, Feet supported Sitting balance-Leahy Scale: Good     Standing balance support: During functional activity, Bilateral upper extremity supported, Reliant on assistive device for balance Standing balance-Leahy Scale: Fair Standing balance comment: no LOB noted on this date                           ADL either performed or assessed with clinical judgement   ADL Overall ADL's : Needs assistance/impaired     Grooming: Oral care;Standing;Supervision/safety;Contact guard assist Grooming Details (indicate cue type and reason): with RW at sink level                 Toilet Transfer: Contact guard assist;Ambulation;Rolling walker (2 wheels);BSC/3in1   Toileting- Clothing Manipulation and Hygiene: Maximal assistance;Sit to/from stand Toileting - Clothing Manipulation Details (indicate cue type and reason): continent BM on toilet     Functional mobility during ADLs: Contact guard assist;Rolling walker (2 wheels) (approx 20' two attempts, intermittent vcs for technique) General ADL Comments: intermittent vcs throughout for technique/safety    Extremity/Trunk Assessment Upper Extremity Assessment Upper Extremity Assessment: Overall WFL for tasks assessed   Lower Extremity Assessment  Lower Extremity Assessment: Generalized weakness        Vision Patient Visual Report: No change from baseline     Perception     Praxis     Communication  Communication Communication: No apparent difficulties Factors Affecting Communication: Hearing impaired   Cognition Arousal: Alert Behavior During Therapy: Impulsive Cognition: No family/caregiver present to determine baseline   Orientation impairments: Time, Situation Awareness: Intellectual awareness impaired, Online awareness impaired Memory impairment (select all impairments): Declarative long-term memory, Working memory Attention impairment (select first level of impairment): Sustained attention Executive functioning impairment (select all impairments): Reasoning, Problem solving                   Following commands: Impaired Following commands impaired: Follows one step commands with increased time, Follows multi-step commands inconsistently      Cueing   Cueing Techniques: Verbal cues, Tactile cues  Exercises Other Exercises Other Exercises: edu re: role of OT, role of rehab, continued participation in ADL/mobility tasks    Shoulder Instructions       General Comments      Pertinent Vitals/ Pain       Pain Assessment Pain Assessment: No/denies pain  Home Living                                          Prior Functioning/Environment              Frequency  Min 2X/week        Progress Toward Goals  OT Goals(current goals can now be found in the care plan section)  Progress towards OT goals: Progressing toward goals  Acute Rehab OT Goals Patient Stated Goal: improve function OT Goal Formulation: With patient Time For Goal Achievement: 07/04/23 Potential to Achieve Goals: Good  Plan      Co-evaluation                 AM-PAC OT "6 Clicks" Daily Activity     Outcome Measure   Help from another person eating meals?: None Help from another person taking care of personal grooming?: None Help from another person toileting, which includes using toliet, bedpan, or urinal?: A Lot Help from another person bathing  (including washing, rinsing, drying)?: A Little Help from another person to put on and taking off regular upper body clothing?: None Help from another person to put on and taking off regular lower body clothing?: A Little 6 Click Score: 20    End of Session Equipment Utilized During Treatment: Rolling walker (2 wheels)  OT Visit Diagnosis: Unsteadiness on feet (R26.81);Other abnormalities of gait and mobility (R26.89);Muscle weakness (generalized) (M62.81);Other symptoms and signs involving cognitive function   Activity Tolerance Patient tolerated treatment well   Patient Left in chair;with call bell/phone within reach;with chair alarm set   Nurse Communication Mobility status        Time: 1610-9604 OT Time Calculation (min): 21 min  Charges: OT General Charges $OT Visit: 1 Visit OT Treatments $Self Care/Home Management : 8-22 mins  Oleta Mouse, OTD OTR/L  06/20/23, 1:45 PM

## 2023-06-20 NOTE — Plan of Care (Signed)
  Problem: Fluid Volume: Goal: Hemodynamic stability will improve Outcome: Adequate for Discharge   Problem: Clinical Measurements: Goal: Signs and symptoms of infection will decrease Outcome: Adequate for Discharge   Problem: Respiratory: Goal: Ability to maintain adequate ventilation will improve Outcome: Adequate for Discharge   Problem: Clinical Measurements: Goal: Ability to maintain clinical measurements within normal limits will improve Outcome: Adequate for Discharge Goal: Will remain free from infection Outcome: Adequate for Discharge Goal: Respiratory complications will improve Outcome: Adequate for Discharge   Problem: Nutrition: Goal: Adequate nutrition will be maintained Outcome: Adequate for Discharge   Problem: Coping: Goal: Level of anxiety will decrease Outcome: Adequate for Discharge   Problem: Elimination: Goal: Will not experience complications related to bowel motility Outcome: Adequate for Discharge   Problem: Pain Managment: Goal: General experience of comfort will improve and/or be controlled Outcome: Adequate for Discharge   Problem: Safety: Goal: Ability to remain free from injury will improve Outcome: Adequate for Discharge   Problem: Skin Integrity: Goal: Risk for impaired skin integrity will decrease Outcome: Adequate for Discharge   Problem: Health Behavior/Discharge Planning: Goal: Ability to manage health-related needs will improve Outcome: Not Met (add Reason) Note: Pt confused, unable to educate.

## 2023-06-20 NOTE — Progress Notes (Signed)
 Progress Note   Patient: Walter Vargas YQI:[id number] DOB: 12/30/1945 DOA: 05/24/2023     19 DOS: the patient was seen and examined on 06/20/2023    Brief hospital course: HPI on admission 06/01/23: "Walter Vargas is a 78 year old male with history of GERD, hyperlipidemia, CAD status post CABG, on Eliquis and amiodarone, carotid enterectomy, who presents emergency department for chief concerns of falling.   Patient was pending discharge to skilled nursing facility in the ED since 05/24/2023.   On day of admission, patient was noted to have hypoxic respite failure, desatting to 80% on room air.  Patient was placed on 6 L nasal cannula with improvement to 94%.  Patient has been titrated down to 3 L nasal cannula at this time with SpO2 100% on room air.  And during hypoxic episode, patient was noted to be altered and trying to pull out leads and oxygen supplementation.  Patient is currently more redirectable.   Vitals in the ED showed temperature of 98.9, respiration rate of 26, heart rate 94, blood pressure 125/70, SpO2 of 80% on room air, currently improved to 1 high percent on 3 L nasal cannula...." See H&P for full HPI on admission & ED course.   Patient was admitted for further evaluation and management of severe sepsis due  multifocal pneumonia complicated by acute respiratory failure with hypoxia.   Further hospital course and management as outlined below.   4/6: Remained hemodynamically stable-awaiting placement.  4/7: TB test was done today, hoping he can go in next couple of days.  4/8: QuantiFERON-TB test results are pending, facility is coming over today to evaluate.  4/9: Patient has been accepted at [facility], awaiting son to complete the paperwork and QuantiFERON-TB test.   Assessment and Plan:     Severe sepsis due to Multifocal Pneumonia, suspect hospital-acquired Pt was boarding in the ED awaiting discharge to SNF for 7 days prior to admission. Severe sepsis  evidenced by tachypnea, leukocytosis, lactic acidosis with pulmonary and cardiac organ involvement in setting of acute hypoxic respiratory failure Has completed antibiotic course Continue mucolytics, bronchodilators and other supportive care per orders     Acute respiratory failure with hypoxia - due to PNA, pleural effusions, ?CHF Has been resolved-now on room air   Chronic combined systolic and diastolic CHF  History of VT (peri-CABG surgery) Abnormal EKG Elevated Troponins likely Demand Ischemia in setting of sepsis and hypoxia Hx of CAD s/p CABGx5 (Dec 2024) Echo 04/20/2023: EF ~ 20--25%, grade II DD Given Lasix 40 mg IV x 1 on admission BNP elevated 3075 (note baseline BNP appears ~2200+) Cardiology was on board but have since signed off Continue amiodarone 200 mg daily Continue low dose losartan  Continue aspirin, Plavix, Lipitor Monitor input and output Continue to monitor daily weight Has completed a course of heparin drip   Hyponatremia likely secondary to hypovolemia Resolved   Bilateral pleural effusions Continue to monitor input and output --Will consider thoracentesis if needed   Normocytic anemia Monitor for signs of bleeding as well as CBC To transfuse RBC's if Hbg < 8 cardiac history   Hyperlipidemia Continue Lipitor   CAD - stable S/P CABG x 5 Continue aspirin and Lipitor    Protein-calorie malnutrition, severe Dietitian on board   Subjective:  Patient was getting ready to work with PT when seen today.  No new concern   Physical examination General.  Malnourished elderly man, in no acute distress. Pulmonary.  Lungs clear bilaterally, normal respiratory effort. CV.  Regular rate and rhythm, no JVD, rub or murmur. Abdomen.  Soft, nontender, nondistended, BS positive. CNS.  Alert and oriented .  No focal neurologic deficit. Extremities.  No edema, no cyanosis, pulses intact and symmetrical.   Family Communication:    Disposition: Status is:  Inpatient Remains inpatient appropriate because: Needing placement    Planned Discharge Destination: Patient is medically stable pending skilled nursing facility According to [facility] patient's needs TB test.  QuantiFERON test done today with pending results  Data Reviewed:        Latest Ref Rng & Units 06/17/2023    5:11 AM 06/13/2023    2:14 AM 06/09/2023    4:28 AM  BMP  Glucose 70 - 99 mg/dL 86  95  89   BUN 8 - 23 mg/dL 23  24  22    Creatinine 0.61 - 1.24 mg/dL 1.61  0.96  0.45   Sodium 135 - 145 mmol/L 135  130  135   Potassium 3.5 - 5.1 mmol/L 4.2  4.7  4.8   Chloride 98 - 111 mmol/L 101  95  101   CO2 22 - 32 mmol/L 28  27  27    Calcium 8.9 - 10.3 mg/dL 8.6  8.5  8.8      Vitals:   06/19/23 2017 06/20/23 0325 06/20/23 0455 06/20/23 0753  BP: 119/82 113/68  111/61  Pulse: 67 60  (!) 56  Resp: 18 18  16   Temp: 97.8 F (36.6 C) 98.4 F (36.9 C)  97.6 F (36.4 C)  TempSrc:      SpO2: 94% 98%  98%  Weight:   58.8 kg   Height:          Latest Ref Rng & Units 06/17/2023    5:11 AM 06/13/2023    2:14 AM 06/09/2023    4:28 AM  CBC  WBC 4.0 - 10.5 K/uL 5.8  6.6  6.7   Hemoglobin 13.0 - 17.0 g/dL 9.9  40.9  81.1   Hematocrit 39.0 - 52.0 % 31.5  32.7  32.8   Platelets 150 - 400 K/uL 329  373  378      Author: Arnetha Courser, MD 06/20/2023 2:55 PM  For on call review [email or web address].

## 2023-06-20 NOTE — TOC Progression Note (Signed)
 Transition of Care [facility]) - Progression Note    Patient Details  Name: Walter Vargas MRN: [id number] Date of Birth: 1946/01/25  Transition of Care [facility]) CM/SW Contact  Marlowe Sax, RN Phone Number: 06/20/2023, 1:49 PM  Clinical Narrative:    Per Care patrol, The patient was accepted at the [facility], The son will have to complete the paperwork and to send the check he will have 7-8 months of spend down and the [facility] can help with Medicaid application, they are hoping to do Placental skin Graft for wound thru [facility] If the family is on board with it, waiting for the son to call care patrol to complete the rest of the process   Expected Discharge Plan: Skilled Nursing Facility Barriers to Discharge: Continued Medical Work up  Expected Discharge Plan and Services       Living arrangements for the past 2 months: Single Family Home                                       Social Determinants of Health (SDOH) Interventions SDOH Screenings   Food Insecurity: No Food Insecurity (06/01/2023)  Housing: Low Risk  (06/01/2023)  Transportation Needs: No Transportation Needs (06/01/2023)  Utilities: Not At Risk (06/01/2023)  Depression (PHQ2-9): Low Risk  (10/19/2022)  Social Connections: Socially Isolated (06/01/2023)  Tobacco Use: Medium Risk (06/01/2023)    Readmission Risk Interventions    03/20/2023   12:12 PM  Readmission Risk Prevention Plan  Transportation Screening Complete  Medication Review Oceanographer) Complete  HRI or Home Care Consult Complete  SW Recovery Care/Counseling Consult Complete  Palliative Care Screening Not Applicable  Skilled Nursing Facility Not Applicable

## 2023-06-20 NOTE — Progress Notes (Signed)
 Physical Therapy Treatment Patient Details Name: Walter Vargas MRN: [id number] DOB: Jul 24, 1945 Today's Date: 06/20/2023   History of Present Illness 78 year old male with history of GERD, hyperlipidemia, CAD status post CABG, on Eliquis and amiodarone, carotid enterectomy, who presented to emergency department after a fall.  Patient was in the ED from 05/24/2023 -06/01/23 pending discharge to skilled nursing facility, but was noted to have hypoxic respite failure, desatting to 80% on room air.  Now admitted with pneumonia.    PT Comments  Pt was long sitting in bed upon arrival. He is alert and greets author by name however continues to present with inconsistent cognition presentation. Only truly oriented x 2 today but remains pleasant and cooperative. Poor overall insight of situation. Pt does however continue to move well and is able to tolerate getting OOB and ambulating around the unit with use of RW. Acute PT will continue to follow per current POC.    If plan is discharge home, recommend the following: A little help with walking and/or transfers;Assistance with cooking/housework;Direct supervision/assist for medications management;Direct supervision/assist for financial management;Assist for transportation;Help with stairs or ramp for entrance;Supervision due to cognitive status     Equipment Recommendations  Other (comment) (ongoing assessment)       Precautions / Restrictions Precautions Precautions: Fall Recall of Precautions/Restrictions: Impaired Restrictions Weight Bearing Restrictions Per Provider Order: Yes RUE Weight Bearing Per Provider Order: Weight bearing as tolerated LUE Weight Bearing Per Provider Order: Weight bearing as tolerated Other Position/Activity Restrictions: sternal precautions- unsure how long cardiologist likes precautions to be adhered to     Mobility  Bed Mobility Overal bed mobility: Needs Assistance Bed Mobility: Supine to Sit  Supine to sit:  Supervision   Transfers Overall transfer level: Needs assistance Equipment used: Rolling walker (2 wheels) Transfers: Sit to/from Stand Sit to Stand: Contact guard assist   Ambulation/Gait Ambulation/Gait assistance: Contact guard assist, Mod assist Gait Distance (Feet): 200 Feet Assistive device: Rolling walker (2 wheels) Gait Pattern/deviations: Step-through pattern Gait velocity: decreased  General Gait Details: Pt was easily able to ambulate 2 laps around unit with RW. does endorse some heel pain but overall tolerated well.    Balance Overall balance assessment: Needs assistance Sitting-balance support: Feet supported Sitting balance-Leahy Scale: Good     Standing balance support: During functional activity, Bilateral upper extremity supported Standing balance-Leahy Scale: Fair     Hotel manager: No apparent difficulties Factors Affecting Communication: Hearing impaired  Cognition Arousal: Alert Behavior During Therapy: WFL for tasks assessed/performed   PT - Cognitive impairments: History of cognitive impairments   Orientation impairments: Time, Situation    PT - Cognition Comments: Pt A and O x2. Pt has inconsistent cognition presentation throughout admission Following commands: Intact Following commands impaired: Follows one step commands with increased time    Cueing Cueing Techniques: Verbal cues, Tactile cues         Pertinent Vitals/Pain Pain Assessment Pain Assessment: No/denies pain     PT Goals (current goals can now be found in the care plan section) Acute Rehab PT Goals Patient Stated Goal: none stated Progress towards PT goals: Progressing toward goals    Frequency    Min 2X/week       Co-evaluation     PT goals addressed during session: Mobility/safety with mobility;Balance;Proper use of DME;Strengthening/ROM        AM-PAC PT "6 Clicks" Mobility   Outcome Measure  Help needed turning from your back  to your side while in a flat  bed without using bedrails?: A Little Help needed moving from lying on your back to sitting on the side of a flat bed without using bedrails?: A Little Help needed moving to and from a bed to a chair (including a wheelchair)?: A Little Help needed standing up from a chair using your arms (e.g., wheelchair or bedside chair)?: A Little Help needed to walk in hospital room?: A Little Help needed climbing 3-5 steps with a railing? : A Little 6 Click Score: 18    End of Session Equipment Utilized During Treatment: Gait belt Activity Tolerance: Patient tolerated treatment well Patient left: in bed;with call bell/phone within reach;with bed alarm set Nurse Communication: Mobility status PT Visit Diagnosis: Other abnormalities of gait and mobility (R26.89);Difficulty in walking, not elsewhere classified (R26.2);Muscle weakness (generalized) (M62.81);Unsteadiness on feet (R26.81);Repeated falls (R29.6)     Time: 1050-1104 PT Time Calculation (min) (ACUTE ONLY): 14 min  Charges:    $Gait Training: 8-22 mins PT General Charges $$ ACUTE PT VISIT: 1 Visit                    Jetta Lout PTA 06/20/23, 4:04 PM

## 2023-06-20 NOTE — TOC Progression Note (Signed)
 Transition of Care [facility]) - Progression Note    Patient Details  Name: Walter Vargas MRN: [id number] Date of Birth: June 14, 1945  Transition [facility] Care [facility]) CM/SW Contact  Marlowe Sax, RN Phone Number: 06/20/2023, 1:23 PM  Clinical Narrative:    Reached out to Danielle at [employer] asking for update, waiting on a response   Expected Discharge Plan: Skilled Nursing Facility Barriers to Discharge: Continued Medical Work up  Expected Discharge Plan and Services       Living arrangements for the past 2 months: Single Family Home                                       Social Determinants of Health (SDOH) Interventions SDOH Screenings   Food Insecurity: No Food Insecurity (06/01/2023)  Housing: Low Risk  (06/01/2023)  Transportation Needs: No Transportation Needs (06/01/2023)  Utilities: Not At Risk (06/01/2023)  Depression (PHQ2-9): Low Risk  (10/19/2022)  Social Connections: Socially Isolated (06/01/2023)  Tobacco Use: Medium Risk (06/01/2023)    Readmission Risk Interventions    03/20/2023   12:12 PM  Readmission Risk Prevention Plan  Transportation Screening Complete  Medication Review Oceanographer) Complete  HRI or Home Care Consult Complete  SW Recovery Care/Counseling Consult Complete  Palliative Care Screening Not Applicable  Skilled Nursing Facility Not Applicable

## 2023-06-20 NOTE — Progress Notes (Signed)
 Nutrition Follow-up  DOCUMENTATION CODES:   Severe malnutrition in context of chronic illness  INTERVENTION:   -Continue MVI with minerals daily -Continue 500 mg vitamin C BID -Continue 220 mg zinc sulfate daily x 14 days -Continue Ensure Enlive po BID, each supplement provides 350 kcal and 20 grams of protein.  -Continue Magic cup BID with meals, each supplement provides 290 kcal and 9 grams of protein  -Continue 2 gram sodium diet  NUTRITION DIAGNOSIS:   Severe Malnutrition related to chronic illness (CAD, CHF) as evidenced by moderate fat depletion, severe fat depletion, percent weight loss, moderate muscle depletion, severe muscle depletion.  Ongoing  GOAL:   Patient will meet greater than or equal to 90% of their needs  Progressing   MONITOR:   PO intake, Supplement acceptance  REASON FOR ASSESSMENT:   Consult Assessment of nutrition requirement/status  ASSESSMENT:   Pt with history of GERD, hyperlipidemia, CAD status post CABG, on Eliquis and amiodarone, carotid enterectomy, who presents for chief concerns of falling.  Reviewed I/O's: -1 L x 24 hours and -9.6 L since 06/06/23  UOP: 1.3 L x 24 hours  Pt sitting up in bed at time of visit. Pt pleasant and in good spirits, offer no complaints. Pt remains with good appetite. Noted meal completions 50-100%. Pt also consuming Ensure supplements.  Wt has been stable over the past week.   Per TOC notes, plan for SNF placement once bed is available.   Medications reviewed and include vitamin C, farxiga, lasix, miralax, and senokot.   Labs reviewed: CBGS: 71.    Diet Order:   Diet Order             Diet 2 gram sodium Fluid consistency: Thin  Diet effective now                   EDUCATION NEEDS:   Education needs have been addressed  Skin:  Skin Assessment: Skin Integrity Issues: Skin Integrity Issues:: Other (Comment), Stage II, Stage I Stage I: heel Stage II: coccyx Other: skin tears to lt arm  and thigh  Last BM:  4/825 (type 4)  Height:   Ht Readings from Last 1 Encounters:  06/01/23 5\' 8"  (1.727 m)    Weight:   Wt Readings from Last 1 Encounters:  06/20/23 58.8 kg    Ideal Body Weight:  70 kg  BMI:  Body mass index is 19.71 kg/m.  Estimated Nutritional Needs:   Kcal:  1800-2000  Protein:  100-115 grams  Fluid:  1.8-2.0 L    Levada Schilling, RD, LDN, CDCES Registered Dietitian III Certified Diabetes Care and Education Specialist If unable to reach this RD, please use "RD Inpatient" group chat on secure chat between hours of 8am-4 pm daily

## 2023-06-21 DIAGNOSIS — J189 Pneumonia, unspecified organism: Secondary | ICD-10-CM | POA: Diagnosis not present

## 2023-06-21 DIAGNOSIS — I25118 Atherosclerotic heart disease of native coronary artery with other forms of angina pectoris: Secondary | ICD-10-CM | POA: Diagnosis not present

## 2023-06-21 DIAGNOSIS — W19XXXA Unspecified fall, initial encounter: Secondary | ICD-10-CM | POA: Diagnosis not present

## 2023-06-21 DIAGNOSIS — R0609 Other forms of dyspnea: Secondary | ICD-10-CM | POA: Diagnosis not present

## 2023-06-21 NOTE — TOC Progression Note (Signed)
 Transition of Care [facility]) - Progression Note    Patient Details  Name: Walter Vargas MRN: [id number] Date of Birth: Jul 29, 1945  Transition of Care [facility]) CM/SW Contact  Marlowe Sax, RN Phone Number: 06/21/2023, 9:04 AM  Clinical Narrative:     Sent via secure email to Danielle at [facility] the TB skin test and explained that the patient is medically ready for DC and we need for the paperwork to be completed so that he can Admit to [facility]  Expected Discharge Plan: Skilled Nursing Facility Barriers to Discharge: Continued Medical Work up  Expected Discharge Plan and Services       Living arrangements for the past 2 months: Single Family Home                                       Social Determinants of Health (SDOH) Interventions SDOH Screenings   Food Insecurity: No Food Insecurity (06/01/2023)  Housing: Low Risk  (06/01/2023)  Transportation Needs: No Transportation Needs (06/01/2023)  Utilities: Not At Risk (06/01/2023)  Depression (PHQ2-9): Low Risk  (10/19/2022)  Social Connections: Socially Isolated (06/01/2023)  Tobacco Use: Medium Risk (06/01/2023)    Readmission Risk Interventions    03/20/2023   12:12 PM  Readmission Risk Prevention Plan  Transportation Screening Complete  Medication Review (RN Care Manager) Complete  HRI or Home Care Consult Complete  SW Recovery Care/Counseling Consult Complete  Palliative Care Screening Not Applicable  Skilled Nursing Facility Not Applicable

## 2023-06-21 NOTE — Plan of Care (Signed)

## 2023-06-21 NOTE — Progress Notes (Signed)
 Progress Note   Patient: Walter Vargas JXB:[id number] DOB: 1945/04/22 DOA: 05/24/2023     20 DOS: the patient was seen and examined on 06/21/2023    Brief hospital course: HPI on admission 06/01/23: "Hendry Speas is a 78 year old male with history of GERD, hyperlipidemia, CAD status post CABG, on Eliquis and amiodarone, carotid enterectomy, who presents emergency department for chief concerns of falling.   Patient was pending discharge to skilled nursing facility in the ED since 05/24/2023.   On day of admission, patient was noted to have hypoxic respite failure, desatting to 80% on room air.  Patient was placed on 6 L nasal cannula with improvement to 94%.  Patient has been titrated down to 3 L nasal cannula at this time with SpO2 100% on room air.  And during hypoxic episode, patient was noted to be altered and trying to pull out leads and oxygen supplementation.  Patient is currently more redirectable.   Vitals in the ED showed temperature of 98.9, respiration rate of 26, heart rate 94, blood pressure 125/70, SpO2 of 80% on room air, currently improved to 1 high percent on 3 L nasal cannula...." See H&P for full HPI on admission & ED course.   Patient was admitted for further evaluation and management of severe sepsis due  multifocal pneumonia complicated by acute respiratory failure with hypoxia.   Further hospital course and management as outlined below.   4/6: Remained hemodynamically stable-awaiting placement.  4/7: TB test was done today, hoping he can go in next couple of days.  4/8: QuantiFERON-TB test results are pending, facility is coming over today to evaluate.  4/9: Patient has been accepted at [facility], awaiting son to complete the paperwork and QuantiFERON-TB test.  4/10: QuantiFERON TB test negative.  Still awaiting son to complete the paperwork before he can go to facility.   Assessment and Plan:     Severe sepsis due to Multifocal Pneumonia, suspect  hospital-acquired Pt was boarding in the ED awaiting discharge to SNF for 7 days prior to admission. Severe sepsis evidenced by tachypnea, leukocytosis, lactic acidosis with pulmonary and cardiac organ involvement in setting of acute hypoxic respiratory failure Has completed antibiotic course Continue mucolytics, bronchodilators and other supportive care per orders     Acute respiratory failure with hypoxia - due to PNA, pleural effusions, ?CHF Has been resolved-now on room air   Chronic combined systolic and diastolic CHF  History of VT (peri-CABG surgery) Abnormal EKG Elevated Troponins likely Demand Ischemia in setting of sepsis and hypoxia Hx of CAD s/p CABGx5 (Dec 2024) Echo 04/20/2023: EF ~ 20--25%, grade II DD Given Lasix 40 mg IV x 1 on admission BNP elevated 3075 (note baseline BNP appears ~2200+) Cardiology was on board but have since signed off Continue amiodarone 200 mg daily Continue low dose losartan  Continue aspirin, Plavix, Lipitor Monitor input and output Continue to monitor daily weight Has completed a course of heparin drip   Hyponatremia likely secondary to hypovolemia Resolved   Bilateral pleural effusions Continue to monitor input and output --Will consider thoracentesis if needed   Normocytic anemia Monitor for signs of bleeding as well as CBC To transfuse RBC's if Hbg < 8 cardiac history   Hyperlipidemia Continue Lipitor   CAD - stable S/P CABG x 5 Continue aspirin and Lipitor    Protein-calorie malnutrition, severe Dietitian on board   Subjective:  Patient was seen and examined today.  No new concern.   Physical examination General.  Frail  elderly man, in no acute distress. Pulmonary.  Lungs clear bilaterally, normal respiratory effort. CV.  Regular rate and rhythm, no JVD, rub or murmur. Abdomen.  Soft, nontender, nondistended, BS positive. CNS.  Alert and oriented .  No focal neurologic deficit. Extremities.  No edema, no cyanosis,  pulses intact and symmetrical.   Family Communication: Family friend at bedside   Disposition: Status is: Inpatient Remains inpatient appropriate because: Needing placement    Planned Discharge Destination: Patient is medically stable pending skilled nursing facility According to [facility] patient's needs TB test.  QuantiFERON test done today with pending results  Data Reviewed:        Latest Ref Rng & Units 06/17/2023    5:11 AM 06/13/2023    2:14 AM 06/09/2023    4:28 AM  BMP  Glucose 70 - 99 mg/dL 86  95  89   BUN 8 - 23 mg/dL 23  24  22    Creatinine 0.61 - 1.24 mg/dL 1.61  0.96  0.45   Sodium 135 - 145 mmol/L 135  130  135   Potassium 3.5 - 5.1 mmol/L 4.2  4.7  4.8   Chloride 98 - 111 mmol/L 101  95  101   CO2 22 - 32 mmol/L 28  27  27    Calcium 8.9 - 10.3 mg/dL 8.6  8.5  8.8      Vitals:   06/20/23 2134 06/21/23 0358 06/21/23 0500 06/21/23 0758  BP: (!) 106/58 110/68  109/65  Pulse: (!) 58 (!) 57  (!) 55  Resp: 18 18  17   Temp: 97.8 F (36.6 C) 97.6 F (36.4 C)  97.8 F (36.6 C)  TempSrc:    Oral  SpO2: 97% 100%  100%  Weight:   59.5 kg   Height:          Latest Ref Rng & Units 06/17/2023    5:11 AM 06/13/2023    2:14 AM 06/09/2023    4:28 AM  CBC  WBC 4.0 - 10.5 K/uL 5.8  6.6  6.7   Hemoglobin 13.0 - 17.0 g/dL 9.9  40.9  81.1   Hematocrit 39.0 - 52.0 % 31.5  32.7  32.8   Platelets 150 - 400 K/uL 329  373  378      Author: Arnetha Courser, MD 06/21/2023 2:52 PM  For on call review [email or web address].

## 2023-06-22 DIAGNOSIS — J189 Pneumonia, unspecified organism: Secondary | ICD-10-CM | POA: Diagnosis not present

## 2023-06-22 DIAGNOSIS — I25118 Atherosclerotic heart disease of native coronary artery with other forms of angina pectoris: Secondary | ICD-10-CM | POA: Diagnosis not present

## 2023-06-22 DIAGNOSIS — W19XXXA Unspecified fall, initial encounter: Secondary | ICD-10-CM | POA: Diagnosis not present

## 2023-06-22 DIAGNOSIS — I472 Ventricular tachycardia, unspecified: Secondary | ICD-10-CM | POA: Diagnosis not present

## 2023-06-22 MED ORDER — GUAIFENESIN-DM 100-10 MG/5ML PO SYRP: 5.0000 mL | ORAL_SOLUTION | ORAL | Status: DC | PRN

## 2023-06-22 MED ORDER — ENSURE ENLIVE PO LIQD: 237.0000 mL | Freq: Two times a day (BID) | ORAL | Status: DC

## 2023-06-22 MED ORDER — FUROSEMIDE 20 MG PO TABS: 20.0000 mg | ORAL_TABLET | Freq: Every day | ORAL | Status: DC

## 2023-06-22 MED ORDER — POLYETHYLENE GLYCOL 3350 17 G PO PACK: 17.0000 g | PACK | Freq: Every day | ORAL | Status: DC

## 2023-06-22 MED ORDER — DAPAGLIFLOZIN PROPANEDIOL 10 MG PO TABS: 10.0000 mg | ORAL_TABLET | Freq: Every day | ORAL | Status: DC

## 2023-06-22 MED ORDER — LOSARTAN POTASSIUM 25 MG PO TABS: 25.0000 mg | ORAL_TABLET | Freq: Every day | ORAL | Status: DC

## 2023-06-22 MED ORDER — AMIODARONE HCL 100 MG PO TABS: 100.0000 mg | ORAL_TABLET | Freq: Every day | ORAL | Status: DC

## 2023-06-22 NOTE — TOC Transition Note (Signed)
 Transition of Care [facility]) - Discharge Note   Patient Details  Name: Walter Vargas MRN: [id number] Date of Birth: September 08, 1945  [facility]) CM/SW Contact:  Alvera Singh, RN Phone Number: 06/22/2023, 1:42 PM   Clinical Narrative:    Patient discharging to [facility]. Home Health set up with [facility]. Transport arranged with the community by [facility] with the [facility].    Final next level of care: Assisted Living Barriers to Discharge: No Barriers Identified   Patient Goals and CMS Choice     Choice offered to / list presented to : Adult Children      Discharge Placement                       Discharge Plan and Services Additional resources added to the After Visit Summary for                            [facility] Arranged: PT [facility] Agency: [facility] Date [facility] Agency Contacted: 06/22/23 Time HH Agency Contacted: [phone] Representative spoke with at [facility] Agency: Adelina Mings  Social Drivers of Health (SDOH) Interventions SDOH Screenings   Food Insecurity: No Food Insecurity (06/01/2023)  Housing: Low Risk  (06/01/2023)  Transportation Needs: No Transportation Needs (06/01/2023)  Utilities: Not At Risk (06/01/2023)  Depression (PHQ2-9): Low Risk  (10/19/2022)  Social Connections: Socially Isolated (06/01/2023)  Tobacco Use: Medium Risk (06/01/2023)     Readmission Risk Interventions    03/20/2023   12:12 PM  Readmission Risk Prevention Plan  Transportation Screening Complete  Medication Review Oceanographer) Complete  HRI or Home Care Consult Complete  SW Recovery Care/Counseling Consult Complete  Palliative Care Screening Not Applicable  Skilled Nursing Facility Not Applicable

## 2023-06-22 NOTE — TOC Progression Note (Signed)
 Transition of Care [facility]) - Progression Note    Patient Details  Name: Walter Vargas MRN: [id number] Date of Birth: 1946-03-01  Transition of Care [facility]) CM/SW Contact  Marlowe Sax, RN Phone Number: 06/22/2023, 9:42 AM  Clinical Narrative:     Spoke with Danielle at [facility], the family of this patient lives out of state and have been doing the paperwork on docu sign, the file he sent was not able to be opened by the facility, he will have to resend the paperwork  [facility] RW was requested to be delivered to the [facility], patient will need EMS transport since family lives out of state  Expected Discharge Plan: Skilled Nursing Facility Barriers to Discharge: Continued Medical Work up  Expected Discharge Plan and Services       Living arrangements for the past 2 months: Single Family Home                                       Social Determinants of Health (SDOH) Interventions SDOH Screenings   Food Insecurity: No Food Insecurity (06/01/2023)  Housing: Low Risk  (06/01/2023)  Transportation Needs: No Transportation Needs (06/01/2023)  Utilities: Not At Risk (06/01/2023)  Depression (PHQ2-9): Low Risk  (10/19/2022)  Social Connections: Socially Isolated (06/01/2023)  Tobacco Use: Medium Risk (06/01/2023)    Readmission Risk Interventions    03/20/2023   12:12 PM  Readmission Risk Prevention Plan  Transportation Screening Complete  Medication Review Oceanographer) Complete  HRI or Home Care Consult Complete  SW Recovery Care/Counseling Consult Complete  Palliative Care Screening Not Applicable  Skilled Nursing Facility Not Applicable

## 2023-06-22 NOTE — Plan of Care (Signed)

## 2023-06-22 NOTE — Discharge Summary (Signed)
 Physician Discharge Summary   Patient: Walter Vargas MRN: [id number] DOB: 05-22-1945  Admit date:     05/24/2023  Discharge date: 06/22/23  Discharge Physician: Arnetha Courser   PCP: [name], NP   Recommendations at discharge:  Please obtain CBC and BMP on follow-up Follow-up with primary care provider Follow-up with cardiology  Discharge Diagnoses: Principal Problem:   Pneumonia Active Problems:   Severe sepsis (HCC)   VT (ventricular tachycardia) (HCC)   Dyspnea on exertion   Pleural effusion   Elevated brain natriuretic peptide (BNP) level   Chronic combined systolic and diastolic CHF (congestive heart failure) (HCC)   Protein-calorie malnutrition, severe   Debility   S/P CABG x 5   Essential hypertension   CAD (coronary artery disease)   Hyperlipidemia   Elevated troponin   Ischemic cardiomyopathy   Fall   Acute respiratory failure with hypoxia (HCC)   Pelvic pain   Hospital Course: Walter Vargas is a 78 year old male with history of GERD, hyperlipidemia, CAD status post CABG, on Eliquis and amiodarone, carotid enterectomy, who presents emergency department for chief concerns of falling.  Patient was pending discharge to skilled nursing facility in the ED since 05/24/2023.  On day of admission, patient was noted to have hypoxic respite failure, desatting to 80% on room air.  Patient was placed on 6 L nasal cannula with improvement to 94%.  Patient has been titrated down to 3 L nasal cannula at this time with SpO2 100% on room air.  And during hypoxic episode, patient was noted to be altered and trying to pull out leads and oxygen supplementation.  Patient is currently more redirectable.  Vitals in the ED showed temperature of 98.9, respiration rate of 26, heart rate 94, blood pressure 125/70, SpO2 of 80% on room air, currently improved to 1 high percent on 3 L nasal cannula.  Patient was admitted for further evaluation and management of severe sepsis due  multifocal pneumonia complicated by acute respiratory failure with hypoxia.  Patient completed a course of antibiotics and remained stable since then.  He initially required oxygen due to acute respiratory failure secondary to pneumonia and pleural effusions which has been resolved and now he is on room air.  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  He found to have mildly elevated troponin likely due to demand ischemia with sepsis and hypoxia.  EF of 20 to 25% and grade 2 diastolic dysfunction according to the echo done on 04/20/2023.  BNP was elevated at 3075, also has baseline elevated BNP.  Cardiology was consulted.  He received 48 hours of heparin.  No other ischemic workup needed.  Cardiology later signed off with recommendations to continue amiodarone, low-dose losartan, aspirin, Plavix and Lipitor.  His Eliquis was discontinued as there is no concern of atrial fibrillation.  Patient was also found to have severe protein caloric malnutrition.  Dietitian was consulted and he will continue on dietary supplements.  Patient was getting wound care for his chronic bilateral heel wounds and will continue to get wound care at the facility.  No concern of infection at this time.  QuantiFERON-TB test was obtained at facility request and it was normal.  Patient is being discharged to SNF for further management.  He will continue on current medications and need to have a close follow-up with his providers for further assistance.      Consultants: Cardiology Procedures performed: None Disposition: Skilled nursing facility Diet  recommendation:  Discharge Diet Orders (From admission, onward)     Start     Ordered   06/22/23 0000  Diet - low sodium heart healthy        06/22/23 1257           Cardiac diet DISCHARGE MEDICATION: Allergies as of 06/22/2023   No Known Allergies      Medication List     STOP taking these medications     apixaban 5 MG Tabs tablet Commonly known as: ELIQUIS   digoxin 0.125 MG tablet Commonly known as: LANOXIN       TAKE these medications    amiodarone 100 MG tablet Commonly known as: PACERONE Take 1 tablet (100 mg total) by mouth daily. What changed:  medication strength how much to take   ascorbic acid 500 MG tablet Commonly known as: VITAMIN C Take 1 tablet (500 mg total) by mouth daily.   aspirin 81 MG chewable tablet Chew 1 tablet (81 mg total) by mouth daily.   atorvastatin 80 MG tablet Commonly known as: LIPITOR Take 1 tablet (80 mg total) by mouth daily.   dapagliflozin propanediol 10 MG Tabs tablet Commonly known as: FARXIGA Take 1 tablet (10 mg total) by mouth daily.   docusate 50 MG/5ML liquid Commonly known as: COLACE Take 10 mLs (100 mg total) by mouth 2 (two) times daily.   feeding supplement Liqd Take 237 mLs by mouth 2 (two) times daily between meals.   furosemide 20 MG tablet Commonly known as: LASIX Take 1 tablet (20 mg total) by mouth daily.   guaiFENesin-dextromethorphan 100-10 MG/5ML syrup Commonly known as: ROBITUSSIN DM Take 5 mLs by mouth every 4 (four) hours as needed for cough.   hydrOXYzine 25 MG tablet Commonly known as: ATARAX Take 1 tablet (25 mg total) by mouth 3 (three) times daily as needed for anxiety.   losartan 25 MG tablet Commonly known as: COZAAR Take 1 tablet (25 mg total) by mouth daily.   Multivitamin Men 50+ Tabs Take 1 tablet by mouth daily.   pantoprazole 40 MG tablet Commonly known as: PROTONIX Take 1 tablet (40 mg total) by mouth 2 (two) times daily before a meal.   polyethylene glycol 17 g packet Commonly known as: MIRALAX / GLYCOLAX Take 17 g by mouth daily.               Durable Medical Equipment  (From admission, onward)           Start     Ordered   06/21/23 1408  For home use only DME Walker rolling  Once       Question Answer Comment  Walker: With 5 Inch Wheels   Patient needs  a walker to treat with the following condition Generalized weakness      06/21/23 1408              Discharge Care Instructions  (From admission, onward)           Start     Ordered   06/22/23 0000  Discharge wound care:       Comments: Wound care  Daily      Comments: Silicone foam dressings to the heel,change every 3 days. ASSESS UNDER dressings each shift for any acute changes in the wounds.     Wound care  Every other day    Comments: Cleanse skin tears with saline, pat dry Cover with single layer of xeroform gauze, top with foam Change every other  day   06/22/23 1257            Contact information for follow-up providers     [name], NP. Schedule an appointment as soon as possible for a visit in 1 week(s).   Specialties: Nurse Practitioner, Family Medicine Contact information: [address] [place] [address] [phone]              Contact information for after-discharge care     Destination     [facility] .   Service: Assisted Living Contact information: [address] [place] [address] [phone]                    Discharge Exam: Ceasar Mons Weights   06/20/23 0455 06/21/23 0500 06/22/23 0600  Weight: 58.8 kg 59.5 kg 60 kg   General.  Severely malnourished elderly man, in no acute distress. Pulmonary.  Lungs clear bilaterally, normal respiratory effort. CV.  Regular rate and rhythm, no JVD, rub or murmur. Abdomen.  Soft, nontender, nondistended, BS positive. CNS.  Alert and oriented .  No focal neurologic deficit. Extremities.  No edema, no cyanosis, pulses intact and symmetrical.  Condition at discharge: stable  The results of significant diagnostics from this hospitalization (including imaging, microbiology, ancillary and laboratory) are listed below for reference.   Imaging Studies: ECHOCARDIOGRAM COMPLETE Result Date: 06/04/2023    ECHOCARDIOGRAM REPORT   Patient  Name:   REISS MOWREY Date of Exam: 06/04/2023 Medical Rec #:  [id number]     Height:       68.0 in Accession #:    [id number]    Weight:       127.6 lb Date of Birth:  Dec 12, 1945     BSA:          1.689 m Patient Age:    77 years      BP:           114/54 mmHg Patient Gender: M             HR:           53 bpm. Exam Location:  [facility] Procedure: 2D Echo, Cardiac Doppler, Color Doppler, Strain Analysis and 3D Echo            (Both Spectral and Color Flow Doppler were utilized during            procedure). Indications:     Elevated Troponin  History:         Patient has prior history of Echocardiogram examinations, most                  recent 04/20/2023. Risk Factors:Hypertension. Mitral                  regurgitation.  Sonographer:     Cristela Blue Referring Phys:  [id number] CADENCE H FURTH Diagnosing Phys: Lorine Bears MD  Sonographer Comments: Global longitudinal strain was attempted. IMPRESSIONS  1. Left ventricular ejection fraction, by estimation, is 20 to 25%. Left ventricular ejection fraction by 3D volume is 31 %. Left ventricular ejection fraction by PLAX is 21 %. The left ventricle has severely decreased function. The left ventricle has no regional wall motion abnormalities. The left ventricular internal cavity size was mildly dilated. There is mild left ventricular hypertrophy. Left ventricular diastolic parameters are indeterminate. The average left ventricular global longitudinal strain is -8.3 %. The global longitudinal strain is abnormal.  2. Right ventricular systolic  function is normal. The right ventricular size is normal. There is mildly elevated pulmonary artery systolic pressure.  3. Left atrial size was moderately dilated.  4. Right atrial size was mildly dilated.  5. Moderate pleural effusion in the left lateral region.  6. The mitral valve is normal in structure. Mild to moderate mitral valve regurgitation. No evidence of mitral stenosis.  7. The aortic valve is normal in structure. Aortic valve  regurgitation is not visualized. No aortic stenosis is present.  8. The inferior vena cava is normal in size with <50% respiratory variability, suggesting right atrial pressure of 8 mmHg. FINDINGS  Left Ventricle: Left ventricular ejection fraction, by estimation, is 20 to 25%. Left ventricular ejection fraction by PLAX is 21 %. Left ventricular ejection fraction by 3D volume is 31 %. The left ventricle has severely decreased function. The left ventricle has no regional wall motion abnormalities. The average left ventricular global longitudinal strain is -8.3 %. Strain was performed and the global longitudinal strain is abnormal. The left ventricular internal cavity size was mildly dilated. There is mild left ventricular hypertrophy. Left ventricular diastolic parameters are indeterminate. Right Ventricle: The right ventricular size is normal. No increase in right ventricular wall thickness. Right ventricular systolic function is normal. There is mildly elevated pulmonary artery systolic pressure. The tricuspid regurgitant velocity is 2.79  m/s, and with an assumed right atrial pressure of 8 mmHg, the estimated right ventricular systolic pressure is 39.1 mmHg. Left Atrium: Left atrial size was moderately dilated. Right Atrium: Right atrial size was mildly dilated. Pericardium: There is no evidence of pericardial effusion. Mitral Valve: The mitral valve is normal in structure. Mild to moderate mitral valve regurgitation. No evidence of mitral valve stenosis. Tricuspid Valve: The tricuspid valve is normal in structure. Tricuspid valve regurgitation is trivial. No evidence of tricuspid stenosis. Aortic Valve: The aortic valve is normal in structure. Aortic valve regurgitation is not visualized. No aortic stenosis is present. Aortic valve mean gradient measures 2.0 mmHg. Aortic valve peak gradient measures 4.0 mmHg. Aortic valve area, by VTI measures 2.98 cm. Pulmonic Valve: The pulmonic valve was normal in structure.  Pulmonic valve regurgitation is not visualized. No evidence of pulmonic stenosis. Aorta: The aortic root is normal in size and structure. Venous: The inferior vena cava is normal in size with less than 50% respiratory variability, suggesting right atrial pressure of 8 mmHg. IAS/Shunts: No atrial level shunt detected by color flow Doppler. Additional Comments: 3D was performed not requiring image post processing on an independent workstation and was abnormal. There is a moderate pleural effusion in the left lateral region.  LEFT VENTRICLE PLAX 2D LV EF:         Left            Diastology                ventricular     LV e' medial:    9.36 cm/s                ejection        LV E/e' medial:  9.0                fraction by     LV e' lateral:   7.72 cm/s                PLAX is 21      LV E/e' lateral: 10.9                %.  LVIDd:         5.30 cm         2D Longitudinal LVIDs:         4.80 cm         Strain LV PW:         1.40 cm         2D Strain GLS   -8.3 % LV IVS:        1.10 cm         Avg: LVOT diam:     2.10 cm LV SV:         52              3D Volume EF LV SV Index:   31              LV 3D EF:    Left LVOT Area:     3.46 cm                     ventricul                                             ar                                             ejection LV Volumes (MOD)                            fraction LV vol d, MOD    241.0 ml                   by 3D A2C:                                        volume is LV vol d, MOD    192.0 ml                   31 %. A4C: LV vol s, MOD    156.0 ml A2C:                           3D Volume EF: LV vol s, MOD    109.0 ml      3D EF:        31 % A4C:                           LV EDV:       206 ml LV SV MOD A2C:   85.0 ml       LV ESV:       143 ml LV SV MOD A4C:   192.0 ml      LV SV:        63 ml LV SV MOD BP:    87.8 ml RIGHT VENTRICLE RV Basal diam:  4.10 cm RV Mid diam:    3.80 cm RV S prime:     20.20 cm/s TAPSE (M-mode): 1.5 cm LEFT ATRIUM  Index        RIGHT  ATRIUM           Index LA diam:        5.00 cm 2.96 cm/m   RA Area:     19.30 cm LA Vol (A2C):   93.3 ml 55.25 ml/m  RA Volume:   60.00 ml  35.53 ml/m LA Vol (A4C):   56.0 ml 33.16 ml/m LA Biplane Vol: 79.9 ml 47.32 ml/m  AORTIC VALVE AV Area (Vmax):    2.63 cm AV Area (Vmean):   2.29 cm AV Area (VTI):     2.98 cm AV Vmax:           100.00 cm/s AV Vmean:          62.700 cm/s AV VTI:            0.173 m AV Peak Grad:      4.0 mmHg AV Mean Grad:      2.0 mmHg LVOT Vmax:         75.80 cm/s LVOT Vmean:        41.500 cm/s LVOT VTI:          0.149 m LVOT/AV VTI ratio: 0.86  AORTA Ao Root diam: 3.40 cm MITRAL VALVE               TRICUSPID VALVE MV Area (PHT): 3.95 cm    TR Peak grad:   31.1 mmHg MV Decel Time: 192 msec    TR Vmax:        279.00 cm/s MV E velocity: 84.40 cm/s                            SHUNTS                            Systemic VTI:  0.15 m                            Systemic Diam: 2.10 cm Lorine Bears MD Electronically signed by Lorine Bears MD Signature Date/Time: 06/04/2023/8:50:13 AM    Final    DG Chest Portable 1 View Result Date: 06/01/2023 CLINICAL DATA:  Shortness of breath.  Dyspnea.  Cough. EXAM: PORTABLE CHEST 1 VIEW COMPARISON:  05/09/2023 FINDINGS: Prior CABG again noted. New diffuse interstitial opacities in the upper lung fields, and atelectasis or consolidation in the lower lung fields. Increased bilateral pleural effusions also seen. IMPRESSION: New diffuse interstitial opacities in the upper lung fields, and atelectasis or consolidation in the lower lung fields. This is likely due to diffuse edema, although superimposed pneumonia cannot be excluded. Bilateral pleural effusions. Electronically Signed   By: Danae Orleans M.D.   On: 06/01/2023 17:50   CT PELVIS WO CONTRAST Result Date: 05/24/2023 CLINICAL DATA:  Status post trauma. EXAM: CT PELVIS WITHOUT CONTRAST TECHNIQUE: Multidetector CT imaging of the pelvis was performed following the standard protocol without  intravenous contrast. RADIATION DOSE REDUCTION: This exam was performed according to the departmental dose-optimization program which includes automated exposure control, adjustment of the mA and/or kV according to patient size and/or use of iterative reconstruction technique. COMPARISON:  February 10, 2023 FINDINGS: Urinary Tract:  No abnormality visualized. Bowel: There is no evidence of dilatation. Noninflamed diverticula are seen throughout the sigmoid colon. A large amount of stool is seen throughout the visualized large bowel. Vascular/Lymphatic: No  pathologically enlarged lymph nodes. There is marked severity calcification of the visualized portions of the abdominal aorta and bilateral iliac arteries. Metallic density coils are suspected along the medial aspect of the right lower quadrant (axial CT images 10 through 18, CT series 4). This represents a new finding when compared to the prior study. Reproductive: The prostate gland is unremarkable. No mass or other significant abnormality Other: There is a small, stable fat containing umbilical hernia. Marked severity pelvic wall anasarca is seen. Musculoskeletal: Chronic and degenerative changes are seen involving the bilateral hips, pelvis and lower lumbar spine. No acute osseous abnormalities are identified. IMPRESSION: 1. No acute osseous abnormality. 2. Chronic and degenerative changes involving the bilateral hips, pelvis and lower lumbar spine. 3. Marked severity pelvic wall anasarca. 4. Sigmoid diverticulosis. 5. Large stool burden without evidence of bowel obstruction. 6. Aortic atherosclerosis. Aortic Atherosclerosis (ICD10-I70.0). Electronically Signed   By: Aram Candela M.D.   On: 05/24/2023 23:57   CT Cervical Spine Wo Contrast Result Date: 05/24/2023 CLINICAL DATA:  Status post fall. EXAM: CT CERVICAL SPINE WITHOUT CONTRAST TECHNIQUE: Multidetector CT imaging of the cervical spine was performed without intravenous contrast. Multiplanar CT  image reconstructions were also generated. RADIATION DOSE REDUCTION: This exam was performed according to the departmental dose-optimization program which includes automated exposure control, adjustment of the mA and/or kV according to patient size and/or use of iterative reconstruction technique. COMPARISON:  None Available. FINDINGS: Alignment: There is reversal of the normal cervical spine lordosis. Approximately 2 mm anterolisthesis of the C3 vertebral body is noted on C4. Approximately 4.5 mm retrolisthesis of the C5 vertebral body is noted on C6. Skull base and vertebrae: No acute fracture. No primary bone lesion or focal pathologic process. Soft tissues and spinal canal: No prevertebral fluid or swelling. No visible canal hematoma. Disc levels: Marked severity endplate sclerosis, anterior osteophyte formation and posterior bony spurring are seen at the levels of C5-C6, C6-C7 and C7-T1. Mild to moderate severity multilevel endplate sclerosis is present throughout the remainder of the cervical spine. Marked severity intervertebral disc space narrowing is seen at C5-C6, C6-C7 and C7-T1 with moderate severity intervertebral disc space narrowing at the level of C4-C5. Bilateral marked severity multilevel facet joint hypertrophy is noted. Upper chest: Moderate severity biapical scarring, atelectasis and/or infiltrate is noted. Moderate to large bilateral pleural effusions are seen. Other: A 12 mm diameter tubular appearing area of low attenuation (approximately 4.36 Hounsfield units) is seen extending from the lower right neck into the anterior mediastinum. IMPRESSION: 1. No acute cervical spine fracture. 2. Marked severity multilevel degenerative changes, most prominent at the levels of C5-C6, C6-C7 and C7-T1. 3. Moderate severity biapical scarring, atelectasis and/or infiltrate. 4. Moderate to large bilateral pleural effusions. 5. Electronically Signed   By: Aram Candela M.D.   On: 05/24/2023 23:37   CT  HEAD WO CONTRAST ( ) Result Date: 05/24/2023 CLINICAL DATA:  Status post fall. EXAM: CT HEAD WITHOUT CONTRAST TECHNIQUE: Contiguous axial images were obtained from the base of the skull through the vertex without intravenous contrast. RADIATION DOSE REDUCTION: This exam was performed according to the departmental dose-optimization program which includes automated exposure control, adjustment of the mA and/or kV according to patient size and/or use of iterative reconstruction technique. COMPARISON:  April 19, 2023 FINDINGS: Brain: There is generalized cerebral atrophy with widening of the extra-axial spaces and ventricular dilatation. There are areas of decreased attenuation within the white matter tracts of the supratentorial brain, consistent with microvascular disease changes. Vascular: Bilateral  marked severity cavernous carotid artery calcification is noted. Skull: Normal. Negative for fracture or focal lesion. Sinuses/Orbits: There is marked severity right ethmoid sinus and moderate severity left-sided sphenoid sinus mucosal thickening. Other: None. IMPRESSION: 1. Generalized cerebral atrophy with chronic white matter small vessel ischemic changes. 2. No acute intracranial abnormality. 3. Marked severity right ethmoid sinus and moderate severity left-sided sphenoid sinus disease. Electronically Signed   By: Aram Candela M.D.   On: 05/24/2023 23:30   DG Shoulder Right Result Date: 05/24/2023 CLINICAL DATA:  Pain after fall. EXAM: RIGHT SHOULDER - 2+ VIEW COMPARISON:  None Available. FINDINGS: There is no evidence of fracture or dislocation. Superior subluxation of the humeral head consistent with chronic rotator cuff arthropathy. Mild acromioclavicular degenerative change. Soft tissues are unremarkable. IMPRESSION: 1. No fracture or dislocation of the right shoulder. 2. Superior subluxation of the humeral head consistent with chronic rotator cuff arthropathy. Electronically Signed   By: Narda Rutherford M.D.   On: 05/24/2023 23:23    Microbiology: Results for orders placed or performed during the hospital encounter of 05/24/23  Resp panel by RT-PCR (RSV, Flu A&B, Covid) Anterior Nasal Swab     Status: None   Collection Time: 05/24/23  9:04 PM   Specimen: Anterior Nasal Swab  Result Value Ref Range Status   SARS Coronavirus 2 by RT PCR NEGATIVE NEGATIVE Final    Comment: (NOTE) SARS-CoV-2 target nucleic acids are NOT DETECTED.  The SARS-CoV-2 RNA is generally detectable in upper respiratory specimens during the acute phase of infection. The lowest concentration of SARS-CoV-2 viral copies this assay can detect is 138 copies/mL. A negative result does not preclude SARS-Cov-2 infection and should not be used as the sole basis for treatment or other patient management decisions. A negative result may occur with  improper specimen collection/handling, submission of specimen other than nasopharyngeal swab, presence of viral mutation(s) within the areas targeted by this assay, and inadequate number of viral copies(<138 copies/mL). A negative result must be combined with clinical observations, patient history, and epidemiological information. The expected result is Negative.  Fact Sheet for Patients:  BloggerCourse.com  Fact Sheet for Healthcare Providers:  SeriousBroker.it  This test is no t yet approved or cleared by the [place] FDA and  has been authorized for detection and/or diagnosis of SARS-CoV-2 by FDA under an Emergency Use Authorization (EUA). This EUA will remain  in effect (meaning this test can be used) for the duration of the COVID-19 declaration under Section 564(b)(1) of the Act, 21 U.S.C.section 360bbb-3(b)(1), unless the authorization is terminated  or revoked sooner.       Influenza A by PCR NEGATIVE NEGATIVE Final   Influenza B by PCR NEGATIVE NEGATIVE Final    Comment: (NOTE) The Xpert Xpress  SARS-CoV-2/FLU/RSV plus assay is intended as an aid in the diagnosis of influenza from Nasopharyngeal swab specimens and should not be used as a sole basis for treatment. Nasal washings and aspirates are unacceptable for Xpert Xpress SARS-CoV-2/FLU/RSV testing.  Fact Sheet for Patients: BloggerCourse.com  Fact Sheet for Healthcare Providers: SeriousBroker.it  This test is not yet approved or cleared by the [place] FDA and has been authorized for detection and/or diagnosis of SARS-CoV-2 by FDA under an Emergency Use Authorization (EUA). This EUA will remain in effect (meaning this test can be used) for the duration of the COVID-19 declaration under Section 564(b)(1) of the Act, 21 U.S.C. section 360bbb-3(b)(1), unless the authorization is terminated or revoked.     Resp Syncytial  Virus by PCR NEGATIVE NEGATIVE Final    Comment: (NOTE) Fact Sheet for Patients: BloggerCourse.com  Fact Sheet for Healthcare Providers: SeriousBroker.it  This test is not yet approved or cleared by the [place] FDA and has been authorized for detection and/or diagnosis of SARS-CoV-2 by FDA under an Emergency Use Authorization (EUA). This EUA will remain in effect (meaning this test can be used) for the duration of the COVID-19 declaration under Section 564(b)(1) of the Act, 21 U.S.C. section 360bbb-3(b)(1), unless the authorization is terminated or revoked.  Performed at [facility], [address] [place], [place] [address]   Resp panel by RT-PCR (RSV, Flu A&B, Covid) Anterior Nasal Swab     Status: None   Collection Time: 06/01/23  4:13 PM   Specimen: Anterior Nasal Swab  Result Value Ref Range Status   SARS Coronavirus 2 by RT PCR NEGATIVE NEGATIVE Final    Comment: (NOTE) SARS-CoV-2 target nucleic acids are NOT DETECTED.  The SARS-CoV-2 RNA is generally detectable in  upper respiratory specimens during the acute phase of infection. The lowest concentration of SARS-CoV-2 viral copies this assay can detect is 138 copies/mL. A negative result does not preclude SARS-Cov-2 infection and should not be used as the sole basis for treatment or other patient management decisions. A negative result may occur with  improper specimen collection/handling, submission of specimen other than nasopharyngeal swab, presence of viral mutation(s) within the areas targeted by this assay, and inadequate number of viral copies(<138 copies/mL). A negative result must be combined with clinical observations, patient history, and epidemiological information. The expected result is Negative.  Fact Sheet for Patients:  BloggerCourse.com  Fact Sheet for Healthcare Providers:  SeriousBroker.it  This test is no t yet approved or cleared by the [place] FDA and  has been authorized for detection and/or diagnosis of SARS-CoV-2 by FDA under an Emergency Use Authorization (EUA). This EUA will remain  in effect (meaning this test can be used) for the duration of the COVID-19 declaration under Section 564(b)(1) of the Act, 21 U.S.C.section 360bbb-3(b)(1), unless the authorization is terminated  or revoked sooner.       Influenza A by PCR NEGATIVE NEGATIVE Final   Influenza B by PCR NEGATIVE NEGATIVE Final    Comment: (NOTE) The Xpert Xpress SARS-CoV-2/FLU/RSV plus assay is intended as an aid in the diagnosis of influenza from Nasopharyngeal swab specimens and should not be used as a sole basis for treatment. Nasal washings and aspirates are unacceptable for Xpert Xpress SARS-CoV-2/FLU/RSV testing.  Fact Sheet for Patients: BloggerCourse.com  Fact Sheet for Healthcare Providers: SeriousBroker.it  This test is not yet approved or cleared by the [place] FDA and has been  authorized for detection and/or diagnosis of SARS-CoV-2 by FDA under an Emergency Use Authorization (EUA). This EUA will remain in effect (meaning this test can be used) for the duration of the COVID-19 declaration under Section 564(b)(1) of the Act, 21 U.S.C. section 360bbb-3(b)(1), unless the authorization is terminated or revoked.     Resp Syncytial Virus by PCR NEGATIVE NEGATIVE Final    Comment: (NOTE) Fact Sheet for Patients: BloggerCourse.com  Fact Sheet for Healthcare Providers: SeriousBroker.it  This test is not yet approved or cleared by the [place] FDA and has been authorized for detection and/or diagnosis of SARS-CoV-2 by FDA under an Emergency Use Authorization (EUA). This EUA will remain in effect (meaning this test can be used) for the duration of the COVID-19 declaration under Section 564(b)(1) of the Act,  21 U.S.C. section 360bbb-3(b)(1), unless the authorization is terminated or revoked.  Performed at [facility], [address] [place], [place] [address]   Culture, blood (routine x 2)     Status: None   Collection Time: 06/01/23  4:29 PM   Specimen: BLOOD  Result Value Ref Range Status   Specimen Description BLOOD LEFT ANTECUBITAL  Final   Special Requests   Final    BOTTLES DRAWN AEROBIC AND ANAEROBIC Blood Culture adequate volume   Culture   Final    NO GROWTH 5 DAYS Performed at [facility], [address] [place], [place] [address]    Report Status 06/06/2023 FINAL  Final  Culture, blood (routine x 2)     Status: None   Collection Time: 06/01/23  6:09 PM   Specimen: BLOOD  Result Value Ref Range Status   Specimen Description BLOOD BLOOD LEFT FOREARM  Final   Special Requests   Final    BOTTLES DRAWN AEROBIC AND ANAEROBIC Blood Culture results may not be optimal due to an inadequate volume of blood received in culture bottles   Culture   Final    NO GROWTH 5  DAYS Performed at [facility], [address] [place], [place] [address]    Report Status 06/06/2023 FINAL  Final  MRSA Next Gen by PCR, Nasal     Status: Abnormal   Collection Time: 06/01/23  6:27 PM   Specimen: Nasal Mucosa; Nasal Swab  Result Value Ref Range Status   MRSA by PCR Next Gen DETECTED (A) NOT DETECTED Final    Comment: CRITICAL RESULT CALLED TO, READ BACK BY AND VERIFIED WITH: Luz Lex RN @ [phone] 06/01/23 [facility] (NOTE) The GeneXpert MRSA Assay (FDA approved for NASAL specimens only), is one component of a comprehensive MRSA colonization surveillance program. It is not intended to diagnose MRSA infection nor to guide or monitor treatment for MRSA infections. Test performance is not FDA approved in patients less than 27 years old. Performed at [facility], [address] [place], [place] [address]     Labs: CBC: Recent Labs  Lab 06/17/23 0511  WBC 5.8  NEUTROABS 3.6  HGB 9.9*  HCT 31.5*  MCV 90.5  PLT 329   Basic Metabolic Panel: Recent Labs  Lab 06/17/23 0511  NA 135  K 4.2  CL 101  CO2 28  GLUCOSE 86  BUN 23  CREATININE 0.73  CALCIUM 8.6*   Liver Function Tests: No results for input(s): "AST", "ALT", "ALKPHOS", "BILITOT", "PROT", "ALBUMIN" in the last 168 hours. CBG: No results for input(s): "GLUCAP" in the last 168 hours.  Discharge time spent: greater than 30 minutes.  This record has been created using Conservation officer, historic buildings. Errors have been sought and corrected,but may not always be located. Such creation errors do not reflect on the standard of care.   Signed: Arnetha Courser, MD [facility] 06/22/2023

## 2023-06-23 DIAGNOSIS — K219 Gastro-esophageal reflux disease without esophagitis: Secondary | ICD-10-CM | POA: Diagnosis not present

## 2023-06-23 DIAGNOSIS — R6520 Severe sepsis without septic shock: Secondary | ICD-10-CM | POA: Diagnosis not present

## 2023-06-23 DIAGNOSIS — D649 Anemia, unspecified: Secondary | ICD-10-CM | POA: Diagnosis not present

## 2023-06-23 DIAGNOSIS — A419 Sepsis, unspecified organism: Secondary | ICD-10-CM | POA: Diagnosis not present

## 2023-06-23 DIAGNOSIS — Z951 Presence of aortocoronary bypass graft: Secondary | ICD-10-CM | POA: Diagnosis not present

## 2023-06-23 DIAGNOSIS — Z7982 Long term (current) use of aspirin: Secondary | ICD-10-CM | POA: Diagnosis not present

## 2023-06-23 DIAGNOSIS — E43 Unspecified severe protein-calorie malnutrition: Secondary | ICD-10-CM | POA: Diagnosis not present

## 2023-06-23 DIAGNOSIS — S51011D Laceration without foreign body of right elbow, subsequent encounter: Secondary | ICD-10-CM | POA: Diagnosis not present

## 2023-06-23 DIAGNOSIS — I110 Hypertensive heart disease with heart failure: Secondary | ICD-10-CM | POA: Diagnosis not present

## 2023-06-23 DIAGNOSIS — Z556 Problems related to health literacy: Secondary | ICD-10-CM | POA: Diagnosis not present

## 2023-06-23 DIAGNOSIS — Z7984 Long term (current) use of oral hypoglycemic drugs: Secondary | ICD-10-CM | POA: Diagnosis not present

## 2023-06-23 DIAGNOSIS — Z9181 History of falling: Secondary | ICD-10-CM | POA: Diagnosis not present

## 2023-06-23 DIAGNOSIS — E785 Hyperlipidemia, unspecified: Secondary | ICD-10-CM | POA: Diagnosis not present

## 2023-06-23 DIAGNOSIS — I5042 Chronic combined systolic (congestive) and diastolic (congestive) heart failure: Secondary | ICD-10-CM | POA: Diagnosis not present

## 2023-06-23 DIAGNOSIS — I2510 Atherosclerotic heart disease of native coronary artery without angina pectoris: Secondary | ICD-10-CM | POA: Diagnosis not present

## 2023-06-25 ENCOUNTER — Telehealth: Payer: Self-pay

## 2023-06-25 DIAGNOSIS — I5042 Chronic combined systolic (congestive) and diastolic (congestive) heart failure: Secondary | ICD-10-CM | POA: Diagnosis not present

## 2023-06-25 DIAGNOSIS — Z7984 Long term (current) use of oral hypoglycemic drugs: Secondary | ICD-10-CM | POA: Diagnosis not present

## 2023-06-25 DIAGNOSIS — D649 Anemia, unspecified: Secondary | ICD-10-CM | POA: Diagnosis not present

## 2023-06-25 DIAGNOSIS — K219 Gastro-esophageal reflux disease without esophagitis: Secondary | ICD-10-CM | POA: Diagnosis not present

## 2023-06-25 DIAGNOSIS — E43 Unspecified severe protein-calorie malnutrition: Secondary | ICD-10-CM | POA: Diagnosis not present

## 2023-06-25 DIAGNOSIS — S51011D Laceration without foreign body of right elbow, subsequent encounter: Secondary | ICD-10-CM | POA: Diagnosis not present

## 2023-06-25 DIAGNOSIS — Z7982 Long term (current) use of aspirin: Secondary | ICD-10-CM | POA: Diagnosis not present

## 2023-06-25 DIAGNOSIS — Z951 Presence of aortocoronary bypass graft: Secondary | ICD-10-CM | POA: Diagnosis not present

## 2023-06-25 DIAGNOSIS — Z9181 History of falling: Secondary | ICD-10-CM | POA: Diagnosis not present

## 2023-06-25 DIAGNOSIS — I2510 Atherosclerotic heart disease of native coronary artery without angina pectoris: Secondary | ICD-10-CM | POA: Diagnosis not present

## 2023-06-25 DIAGNOSIS — R6520 Severe sepsis without septic shock: Secondary | ICD-10-CM | POA: Diagnosis not present

## 2023-06-25 DIAGNOSIS — A419 Sepsis, unspecified organism: Secondary | ICD-10-CM | POA: Diagnosis not present

## 2023-06-25 DIAGNOSIS — I110 Hypertensive heart disease with heart failure: Secondary | ICD-10-CM | POA: Diagnosis not present

## 2023-06-25 DIAGNOSIS — E785 Hyperlipidemia, unspecified: Secondary | ICD-10-CM | POA: Diagnosis not present

## 2023-06-25 DIAGNOSIS — Z556 Problems related to health literacy: Secondary | ICD-10-CM | POA: Diagnosis not present

## 2023-06-25 NOTE — Telephone Encounter (Signed)
 Noted.

## 2023-06-25 NOTE — Telephone Encounter (Signed)
 I spoke with Walter Vargas at [employer] and she said pt did not have injury from recent fall but will schedule 3 mth FU for pt. 07/09/23 at 10 AM was first available appt that met transportation needs at [facility] of [address] UC & ED precautions given and ann voiced understanding. Sending note to Margarie Shay NP.

## 2023-06-25 NOTE — Telephone Encounter (Signed)
 Left v/m requesting  pt to cb due to scheduling appt due to fall and time for pts 3 month FU. Sending to [facility] triage and Margarie Shay NP and [facility] pool.

## 2023-06-25 NOTE — Telephone Encounter (Signed)
 [name]

## 2023-06-26 ENCOUNTER — Ambulatory Visit: Payer: 59 | Admitting: Thoracic Surgery (Cardiothoracic Vascular Surgery)

## 2023-06-26 DIAGNOSIS — K5900 Constipation, unspecified: Secondary | ICD-10-CM | POA: Diagnosis not present

## 2023-06-26 DIAGNOSIS — I2510 Atherosclerotic heart disease of native coronary artery without angina pectoris: Secondary | ICD-10-CM | POA: Diagnosis not present

## 2023-06-26 DIAGNOSIS — I255 Ischemic cardiomyopathy: Secondary | ICD-10-CM | POA: Diagnosis not present

## 2023-06-26 DIAGNOSIS — R269 Unspecified abnormalities of gait and mobility: Secondary | ICD-10-CM | POA: Diagnosis not present

## 2023-06-26 DIAGNOSIS — I472 Ventricular tachycardia, unspecified: Secondary | ICD-10-CM | POA: Diagnosis not present

## 2023-06-26 DIAGNOSIS — I10 Essential (primary) hypertension: Secondary | ICD-10-CM | POA: Diagnosis not present

## 2023-06-26 DIAGNOSIS — K219 Gastro-esophageal reflux disease without esophagitis: Secondary | ICD-10-CM | POA: Diagnosis not present

## 2023-06-27 ENCOUNTER — Encounter: Payer: Self-pay | Admitting: Thoracic Surgery (Cardiothoracic Vascular Surgery)

## 2023-06-29 DIAGNOSIS — E785 Hyperlipidemia, unspecified: Secondary | ICD-10-CM | POA: Diagnosis not present

## 2023-06-29 DIAGNOSIS — Z556 Problems related to health literacy: Secondary | ICD-10-CM | POA: Diagnosis not present

## 2023-06-29 DIAGNOSIS — I5042 Chronic combined systolic (congestive) and diastolic (congestive) heart failure: Secondary | ICD-10-CM | POA: Diagnosis not present

## 2023-06-29 DIAGNOSIS — K219 Gastro-esophageal reflux disease without esophagitis: Secondary | ICD-10-CM | POA: Diagnosis not present

## 2023-06-29 DIAGNOSIS — Z9181 History of falling: Secondary | ICD-10-CM | POA: Diagnosis not present

## 2023-06-29 DIAGNOSIS — R6520 Severe sepsis without septic shock: Secondary | ICD-10-CM | POA: Diagnosis not present

## 2023-06-29 DIAGNOSIS — Z7982 Long term (current) use of aspirin: Secondary | ICD-10-CM | POA: Diagnosis not present

## 2023-06-29 DIAGNOSIS — Z951 Presence of aortocoronary bypass graft: Secondary | ICD-10-CM | POA: Diagnosis not present

## 2023-06-29 DIAGNOSIS — Z7984 Long term (current) use of oral hypoglycemic drugs: Secondary | ICD-10-CM | POA: Diagnosis not present

## 2023-06-29 DIAGNOSIS — A419 Sepsis, unspecified organism: Secondary | ICD-10-CM | POA: Diagnosis not present

## 2023-06-29 DIAGNOSIS — D649 Anemia, unspecified: Secondary | ICD-10-CM | POA: Diagnosis not present

## 2023-06-29 DIAGNOSIS — I2510 Atherosclerotic heart disease of native coronary artery without angina pectoris: Secondary | ICD-10-CM | POA: Diagnosis not present

## 2023-06-29 DIAGNOSIS — E43 Unspecified severe protein-calorie malnutrition: Secondary | ICD-10-CM | POA: Diagnosis not present

## 2023-06-29 DIAGNOSIS — I110 Hypertensive heart disease with heart failure: Secondary | ICD-10-CM | POA: Diagnosis not present

## 2023-06-29 DIAGNOSIS — S51011D Laceration without foreign body of right elbow, subsequent encounter: Secondary | ICD-10-CM | POA: Diagnosis not present

## 2023-07-02 NOTE — Telephone Encounter (Signed)
 Patient in SNF

## 2023-07-04 ENCOUNTER — Telehealth: Payer: Self-pay | Admitting: Nurse Practitioner

## 2023-07-04 DIAGNOSIS — I110 Hypertensive heart disease with heart failure: Secondary | ICD-10-CM | POA: Diagnosis not present

## 2023-07-04 DIAGNOSIS — K219 Gastro-esophageal reflux disease without esophagitis: Secondary | ICD-10-CM | POA: Diagnosis not present

## 2023-07-04 DIAGNOSIS — Z951 Presence of aortocoronary bypass graft: Secondary | ICD-10-CM | POA: Diagnosis not present

## 2023-07-04 DIAGNOSIS — Z7982 Long term (current) use of aspirin: Secondary | ICD-10-CM | POA: Diagnosis not present

## 2023-07-04 DIAGNOSIS — Z79899 Other long term (current) drug therapy: Secondary | ICD-10-CM | POA: Diagnosis not present

## 2023-07-04 DIAGNOSIS — Z556 Problems related to health literacy: Secondary | ICD-10-CM | POA: Diagnosis not present

## 2023-07-04 DIAGNOSIS — D649 Anemia, unspecified: Secondary | ICD-10-CM | POA: Diagnosis not present

## 2023-07-04 DIAGNOSIS — R6520 Severe sepsis without septic shock: Secondary | ICD-10-CM | POA: Diagnosis not present

## 2023-07-04 DIAGNOSIS — S51011D Laceration without foreign body of right elbow, subsequent encounter: Secondary | ICD-10-CM | POA: Diagnosis not present

## 2023-07-04 DIAGNOSIS — Z7984 Long term (current) use of oral hypoglycemic drugs: Secondary | ICD-10-CM | POA: Diagnosis not present

## 2023-07-04 DIAGNOSIS — I2510 Atherosclerotic heart disease of native coronary artery without angina pectoris: Secondary | ICD-10-CM | POA: Diagnosis not present

## 2023-07-04 DIAGNOSIS — Z9181 History of falling: Secondary | ICD-10-CM | POA: Diagnosis not present

## 2023-07-04 DIAGNOSIS — I5042 Chronic combined systolic (congestive) and diastolic (congestive) heart failure: Secondary | ICD-10-CM | POA: Diagnosis not present

## 2023-07-04 DIAGNOSIS — A419 Sepsis, unspecified organism: Secondary | ICD-10-CM | POA: Diagnosis not present

## 2023-07-04 DIAGNOSIS — E785 Hyperlipidemia, unspecified: Secondary | ICD-10-CM | POA: Diagnosis not present

## 2023-07-04 DIAGNOSIS — E43 Unspecified severe protein-calorie malnutrition: Secondary | ICD-10-CM | POA: Diagnosis not present

## 2023-07-04 DIAGNOSIS — E559 Vitamin D deficiency, unspecified: Secondary | ICD-10-CM | POA: Diagnosis not present

## 2023-07-04 NOTE — Telephone Encounter (Signed)
 Contacted lydia from [facility].  Confirmed the fax sent was not received. Lonzell Robin stated that she will re-fax plan of care to [phone]

## 2023-07-04 NOTE — Telephone Encounter (Signed)
 Copied from CRM [id number]. Topic: General - Other >> Jul 04, 2023  8:48 AM Kita Perish H wrote: Reason for CRM: Lonzell Robin is following up to verify if office received plan of care for patient faxed over on 4/18, please reach out if office has not received thanks.  Lydia-[facility] [phone] Ext [phone]

## 2023-07-06 DIAGNOSIS — S51011A Laceration without foreign body of right elbow, initial encounter: Secondary | ICD-10-CM | POA: Diagnosis not present

## 2023-07-09 ENCOUNTER — Ambulatory Visit: Admitting: Nurse Practitioner

## 2023-07-09 NOTE — Progress Notes (Deleted)
   Established Patient Office Visit  Subjective   Patient ID: Walter Vargas, male    DOB: [birth year]  Age: 78 y.o. MRN: [id number]  No chief complaint on file.   HPI  Since last office visit patient has had several hospital admissions the first 1 being 02/10/2023.  Patient came in with intermittent shortness of breath over the past couple weeks.  He also described vague chest pain.  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  Diagnosed with AKI and NSTEMI.  Patient did undergo right heart cath on 02/13/2023.  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  Patient was transferred to inpatient rehab.  While in rehab patient had some behavioral abnormalities and imaging was obtained that showed stroke. He was discharged and brought back to the emergency department on 05/24/2023 for multiple falls.  He has been discharged a friend showed up stating does not feel comfortable patient going home and would like patient placed.  While patient was in the ED he became hypoxic and was readmitted to the hospital.  He required oxygen.  Patient was found at the skilled nursing facility and discharged on 06/22/2023  {History (Optional):23778}  ROS    Objective:     There were no vitals taken for this visit. {Vitals History (Optional):23777}  Physical Exam   No results found for any visits on 07/09/23.  {Labs (Optional):23779}  The ASCVD Risk score (Arnett DK, et al., 2019) failed to calculate for the following reasons:   Risk score cannot be calculated because patient has a medical history suggesting prior/existing ASCVD    Assessment & Plan:   Problem List Items Addressed This Visit   None   No follow-ups on file.    Margarie Shay, NP

## 2023-07-10 DIAGNOSIS — I255 Ischemic cardiomyopathy: Secondary | ICD-10-CM | POA: Diagnosis not present

## 2023-07-11 ENCOUNTER — Telehealth: Payer: Self-pay | Admitting: Nurse Practitioner

## 2023-07-11 NOTE — Telephone Encounter (Signed)
 Called pt. No answer and Unable to leave voicemail. Mailbox is full. Will try again later.

## 2023-07-11 NOTE — Telephone Encounter (Signed)
 I have gotten lots of orders to sign for the patient home health and wound. He has had 2 appointments with me as of late that he has not made. Can we call and see if he is at home or in a facility. Can we try and get him into the office over the next 2 weeks?

## 2023-07-12 DIAGNOSIS — I2510 Atherosclerotic heart disease of native coronary artery without angina pectoris: Secondary | ICD-10-CM | POA: Diagnosis not present

## 2023-07-12 DIAGNOSIS — E785 Hyperlipidemia, unspecified: Secondary | ICD-10-CM | POA: Diagnosis not present

## 2023-07-12 DIAGNOSIS — S51011D Laceration without foreign body of right elbow, subsequent encounter: Secondary | ICD-10-CM | POA: Diagnosis not present

## 2023-07-12 DIAGNOSIS — E43 Unspecified severe protein-calorie malnutrition: Secondary | ICD-10-CM | POA: Diagnosis not present

## 2023-07-12 DIAGNOSIS — I6522 Occlusion and stenosis of left carotid artery: Secondary | ICD-10-CM | POA: Diagnosis not present

## 2023-07-12 DIAGNOSIS — I63512 Cerebral infarction due to unspecified occlusion or stenosis of left middle cerebral artery: Secondary | ICD-10-CM | POA: Diagnosis not present

## 2023-07-12 DIAGNOSIS — Z7984 Long term (current) use of oral hypoglycemic drugs: Secondary | ICD-10-CM | POA: Diagnosis not present

## 2023-07-12 DIAGNOSIS — I5042 Chronic combined systolic (congestive) and diastolic (congestive) heart failure: Secondary | ICD-10-CM | POA: Diagnosis not present

## 2023-07-12 DIAGNOSIS — Z9181 History of falling: Secondary | ICD-10-CM | POA: Diagnosis not present

## 2023-07-12 DIAGNOSIS — D649 Anemia, unspecified: Secondary | ICD-10-CM | POA: Diagnosis not present

## 2023-07-12 DIAGNOSIS — M6281 Muscle weakness (generalized): Secondary | ICD-10-CM | POA: Diagnosis not present

## 2023-07-12 DIAGNOSIS — Z556 Problems related to health literacy: Secondary | ICD-10-CM | POA: Diagnosis not present

## 2023-07-12 DIAGNOSIS — R2681 Unsteadiness on feet: Secondary | ICD-10-CM | POA: Diagnosis not present

## 2023-07-12 DIAGNOSIS — I110 Hypertensive heart disease with heart failure: Secondary | ICD-10-CM | POA: Diagnosis not present

## 2023-07-12 DIAGNOSIS — Z951 Presence of aortocoronary bypass graft: Secondary | ICD-10-CM | POA: Diagnosis not present

## 2023-07-12 DIAGNOSIS — A419 Sepsis, unspecified organism: Secondary | ICD-10-CM | POA: Diagnosis not present

## 2023-07-12 DIAGNOSIS — Z7982 Long term (current) use of aspirin: Secondary | ICD-10-CM | POA: Diagnosis not present

## 2023-07-12 DIAGNOSIS — K219 Gastro-esophageal reflux disease without esophagitis: Secondary | ICD-10-CM | POA: Diagnosis not present

## 2023-07-12 DIAGNOSIS — R6520 Severe sepsis without septic shock: Secondary | ICD-10-CM | POA: Diagnosis not present

## 2023-07-12 NOTE — Telephone Encounter (Signed)
 Called pt. No answer and Unable to leave voicemail.

## 2023-07-12 NOTE — Telephone Encounter (Signed)
 He has had two appointments with me that he has not made. I am not giving any orders until I see him in office. Can we try and see why he has not made it or at least try and get a virtual visit

## 2023-07-12 NOTE — Telephone Encounter (Signed)
 Copied from CRM [id number]. Topic: Clinical - Home Health Verbal Orders >> Jul 12, 2023 12:02 PM Adonis Hoot wrote: Caller/Agency: Tasha/[facility] Callback Number: [phone]  Patient keeps refusing services for wound care.Patient yells at them when they go to care for him,and tells them to leave his room,would like an order for discharge

## 2023-07-13 DIAGNOSIS — I2510 Atherosclerotic heart disease of native coronary artery without angina pectoris: Secondary | ICD-10-CM | POA: Diagnosis not present

## 2023-07-13 DIAGNOSIS — E785 Hyperlipidemia, unspecified: Secondary | ICD-10-CM | POA: Diagnosis not present

## 2023-07-13 DIAGNOSIS — Z951 Presence of aortocoronary bypass graft: Secondary | ICD-10-CM | POA: Diagnosis not present

## 2023-07-13 DIAGNOSIS — Z9181 History of falling: Secondary | ICD-10-CM | POA: Diagnosis not present

## 2023-07-13 DIAGNOSIS — Z7982 Long term (current) use of aspirin: Secondary | ICD-10-CM | POA: Diagnosis not present

## 2023-07-13 DIAGNOSIS — K219 Gastro-esophageal reflux disease without esophagitis: Secondary | ICD-10-CM | POA: Diagnosis not present

## 2023-07-13 DIAGNOSIS — E43 Unspecified severe protein-calorie malnutrition: Secondary | ICD-10-CM | POA: Diagnosis not present

## 2023-07-13 DIAGNOSIS — R6520 Severe sepsis without septic shock: Secondary | ICD-10-CM | POA: Diagnosis not present

## 2023-07-13 DIAGNOSIS — Z7984 Long term (current) use of oral hypoglycemic drugs: Secondary | ICD-10-CM | POA: Diagnosis not present

## 2023-07-13 DIAGNOSIS — I110 Hypertensive heart disease with heart failure: Secondary | ICD-10-CM | POA: Diagnosis not present

## 2023-07-13 DIAGNOSIS — A419 Sepsis, unspecified organism: Secondary | ICD-10-CM | POA: Diagnosis not present

## 2023-07-13 DIAGNOSIS — S51011D Laceration without foreign body of right elbow, subsequent encounter: Secondary | ICD-10-CM | POA: Diagnosis not present

## 2023-07-13 DIAGNOSIS — I5042 Chronic combined systolic (congestive) and diastolic (congestive) heart failure: Secondary | ICD-10-CM | POA: Diagnosis not present

## 2023-07-13 DIAGNOSIS — D649 Anemia, unspecified: Secondary | ICD-10-CM | POA: Diagnosis not present

## 2023-07-13 DIAGNOSIS — Z556 Problems related to health literacy: Secondary | ICD-10-CM | POA: Diagnosis not present

## 2023-07-13 NOTE — Telephone Encounter (Signed)
 Left voicemail for Walter Vargas/[facility] to call the office back.

## 2023-07-16 NOTE — Telephone Encounter (Signed)
 Copied from CRM [id number]. Topic: General - Other >> Jul 16, 2023 11:49 AM Freya Jesus wrote: Reason for CRM: Lonzell Robin with [facility] calling to follow up on patient's plan of care and to see if it was signed. [phone] ext. [phone]

## 2023-07-16 NOTE — Telephone Encounter (Signed)
 Left voicemail for [facility] to call the office back.

## 2023-07-17 NOTE — Telephone Encounter (Signed)
 Left voicemail for [facility] to call the office back.     Contacted patients alternate contact person. Left a VM to call back.

## 2023-07-20 DIAGNOSIS — I5042 Chronic combined systolic (congestive) and diastolic (congestive) heart failure: Secondary | ICD-10-CM | POA: Diagnosis not present

## 2023-07-20 DIAGNOSIS — I110 Hypertensive heart disease with heart failure: Secondary | ICD-10-CM | POA: Diagnosis not present

## 2023-07-20 DIAGNOSIS — Z9181 History of falling: Secondary | ICD-10-CM | POA: Diagnosis not present

## 2023-07-20 DIAGNOSIS — Z7982 Long term (current) use of aspirin: Secondary | ICD-10-CM | POA: Diagnosis not present

## 2023-07-20 DIAGNOSIS — R6520 Severe sepsis without septic shock: Secondary | ICD-10-CM | POA: Diagnosis not present

## 2023-07-20 DIAGNOSIS — E43 Unspecified severe protein-calorie malnutrition: Secondary | ICD-10-CM | POA: Diagnosis not present

## 2023-07-20 DIAGNOSIS — Z951 Presence of aortocoronary bypass graft: Secondary | ICD-10-CM | POA: Diagnosis not present

## 2023-07-20 DIAGNOSIS — D649 Anemia, unspecified: Secondary | ICD-10-CM | POA: Diagnosis not present

## 2023-07-20 DIAGNOSIS — S51011D Laceration without foreign body of right elbow, subsequent encounter: Secondary | ICD-10-CM | POA: Diagnosis not present

## 2023-07-20 DIAGNOSIS — I2510 Atherosclerotic heart disease of native coronary artery without angina pectoris: Secondary | ICD-10-CM | POA: Diagnosis not present

## 2023-07-20 DIAGNOSIS — A419 Sepsis, unspecified organism: Secondary | ICD-10-CM | POA: Diagnosis not present

## 2023-07-20 DIAGNOSIS — E785 Hyperlipidemia, unspecified: Secondary | ICD-10-CM | POA: Diagnosis not present

## 2023-07-20 DIAGNOSIS — Z556 Problems related to health literacy: Secondary | ICD-10-CM | POA: Diagnosis not present

## 2023-07-20 DIAGNOSIS — Z7984 Long term (current) use of oral hypoglycemic drugs: Secondary | ICD-10-CM | POA: Diagnosis not present

## 2023-07-20 DIAGNOSIS — K219 Gastro-esophageal reflux disease without esophagitis: Secondary | ICD-10-CM | POA: Diagnosis not present

## 2023-07-23 NOTE — Telephone Encounter (Signed)
 Lonzell Robin called back returning Janae's call regarding status of pt's plan of care. Spoke to [employer], he states due to pt not being seen since 02/05/23, ppw hasn't been signed. Jolan Natal stated he has even offered to do virtual visits instead of in person. Relayed message to [place], she states she'd let pt know to schedule visit soon.

## 2023-07-25 DIAGNOSIS — K219 Gastro-esophageal reflux disease without esophagitis: Secondary | ICD-10-CM | POA: Diagnosis not present

## 2023-07-25 DIAGNOSIS — S51011D Laceration without foreign body of right elbow, subsequent encounter: Secondary | ICD-10-CM | POA: Diagnosis not present

## 2023-07-25 DIAGNOSIS — Z951 Presence of aortocoronary bypass graft: Secondary | ICD-10-CM | POA: Diagnosis not present

## 2023-07-25 DIAGNOSIS — I110 Hypertensive heart disease with heart failure: Secondary | ICD-10-CM | POA: Diagnosis not present

## 2023-07-25 DIAGNOSIS — R6520 Severe sepsis without septic shock: Secondary | ICD-10-CM | POA: Diagnosis not present

## 2023-07-25 DIAGNOSIS — Z9181 History of falling: Secondary | ICD-10-CM | POA: Diagnosis not present

## 2023-07-25 DIAGNOSIS — I5042 Chronic combined systolic (congestive) and diastolic (congestive) heart failure: Secondary | ICD-10-CM | POA: Diagnosis not present

## 2023-07-25 DIAGNOSIS — E785 Hyperlipidemia, unspecified: Secondary | ICD-10-CM | POA: Diagnosis not present

## 2023-07-25 DIAGNOSIS — Z7984 Long term (current) use of oral hypoglycemic drugs: Secondary | ICD-10-CM | POA: Diagnosis not present

## 2023-07-25 DIAGNOSIS — E43 Unspecified severe protein-calorie malnutrition: Secondary | ICD-10-CM | POA: Diagnosis not present

## 2023-07-25 DIAGNOSIS — D649 Anemia, unspecified: Secondary | ICD-10-CM | POA: Diagnosis not present

## 2023-07-25 DIAGNOSIS — Z556 Problems related to health literacy: Secondary | ICD-10-CM | POA: Diagnosis not present

## 2023-07-25 DIAGNOSIS — I2510 Atherosclerotic heart disease of native coronary artery without angina pectoris: Secondary | ICD-10-CM | POA: Diagnosis not present

## 2023-07-25 DIAGNOSIS — A419 Sepsis, unspecified organism: Secondary | ICD-10-CM | POA: Diagnosis not present

## 2023-07-25 DIAGNOSIS — Z7982 Long term (current) use of aspirin: Secondary | ICD-10-CM | POA: Diagnosis not present

## 2023-07-26 ENCOUNTER — Ambulatory Visit: Payer: 59 | Attending: Cardiology | Admitting: Cardiology

## 2023-07-26 DIAGNOSIS — I2510 Atherosclerotic heart disease of native coronary artery without angina pectoris: Secondary | ICD-10-CM | POA: Diagnosis not present

## 2023-07-26 DIAGNOSIS — I10 Essential (primary) hypertension: Secondary | ICD-10-CM | POA: Diagnosis not present

## 2023-07-26 DIAGNOSIS — I639 Cerebral infarction, unspecified: Secondary | ICD-10-CM | POA: Diagnosis not present

## 2023-07-26 DIAGNOSIS — R2681 Unsteadiness on feet: Secondary | ICD-10-CM | POA: Diagnosis not present

## 2023-07-26 DIAGNOSIS — I255 Ischemic cardiomyopathy: Secondary | ICD-10-CM | POA: Diagnosis not present

## 2023-07-31 ENCOUNTER — Telehealth: Payer: Self-pay | Admitting: Nurse Practitioner

## 2023-07-31 DIAGNOSIS — E785 Hyperlipidemia, unspecified: Secondary | ICD-10-CM | POA: Diagnosis not present

## 2023-07-31 DIAGNOSIS — E43 Unspecified severe protein-calorie malnutrition: Secondary | ICD-10-CM | POA: Diagnosis not present

## 2023-07-31 DIAGNOSIS — Z556 Problems related to health literacy: Secondary | ICD-10-CM | POA: Diagnosis not present

## 2023-07-31 DIAGNOSIS — K219 Gastro-esophageal reflux disease without esophagitis: Secondary | ICD-10-CM | POA: Diagnosis not present

## 2023-07-31 DIAGNOSIS — Z7984 Long term (current) use of oral hypoglycemic drugs: Secondary | ICD-10-CM | POA: Diagnosis not present

## 2023-07-31 DIAGNOSIS — I5042 Chronic combined systolic (congestive) and diastolic (congestive) heart failure: Secondary | ICD-10-CM | POA: Diagnosis not present

## 2023-07-31 DIAGNOSIS — R6520 Severe sepsis without septic shock: Secondary | ICD-10-CM | POA: Diagnosis not present

## 2023-07-31 DIAGNOSIS — Z7982 Long term (current) use of aspirin: Secondary | ICD-10-CM | POA: Diagnosis not present

## 2023-07-31 DIAGNOSIS — Z9181 History of falling: Secondary | ICD-10-CM | POA: Diagnosis not present

## 2023-07-31 DIAGNOSIS — D649 Anemia, unspecified: Secondary | ICD-10-CM | POA: Diagnosis not present

## 2023-07-31 DIAGNOSIS — A419 Sepsis, unspecified organism: Secondary | ICD-10-CM | POA: Diagnosis not present

## 2023-07-31 DIAGNOSIS — Z951 Presence of aortocoronary bypass graft: Secondary | ICD-10-CM | POA: Diagnosis not present

## 2023-07-31 DIAGNOSIS — S51011D Laceration without foreign body of right elbow, subsequent encounter: Secondary | ICD-10-CM | POA: Diagnosis not present

## 2023-07-31 DIAGNOSIS — I2510 Atherosclerotic heart disease of native coronary artery without angina pectoris: Secondary | ICD-10-CM | POA: Diagnosis not present

## 2023-07-31 DIAGNOSIS — I110 Hypertensive heart disease with heart failure: Secondary | ICD-10-CM | POA: Diagnosis not present

## 2023-07-31 NOTE — Telephone Encounter (Signed)
 Copied from CRM [id number]. Topic: General - Other >> Jul 30, 2023  3:48 PM Emylou G wrote: Reason for CRM: Oakley Bellman w/[facility] [phone] When was the last time seen?  In regards to the skin test.. may have to discharge? ( They only could verify name and DOB )

## 2023-08-01 DIAGNOSIS — I255 Ischemic cardiomyopathy: Secondary | ICD-10-CM | POA: Diagnosis not present

## 2023-08-01 DIAGNOSIS — I509 Heart failure, unspecified: Secondary | ICD-10-CM | POA: Diagnosis not present

## 2023-08-01 DIAGNOSIS — R1310 Dysphagia, unspecified: Secondary | ICD-10-CM | POA: Diagnosis not present

## 2023-08-01 DIAGNOSIS — G8929 Other chronic pain: Secondary | ICD-10-CM | POA: Diagnosis not present

## 2023-08-01 DIAGNOSIS — I472 Ventricular tachycardia, unspecified: Secondary | ICD-10-CM | POA: Diagnosis not present

## 2023-08-01 DIAGNOSIS — Z951 Presence of aortocoronary bypass graft: Secondary | ICD-10-CM | POA: Diagnosis not present

## 2023-08-01 DIAGNOSIS — I2510 Atherosclerotic heart disease of native coronary artery without angina pectoris: Secondary | ICD-10-CM | POA: Diagnosis not present

## 2023-08-01 DIAGNOSIS — K5900 Constipation, unspecified: Secondary | ICD-10-CM | POA: Diagnosis not present

## 2023-08-01 DIAGNOSIS — I6930 Unspecified sequelae of cerebral infarction: Secondary | ICD-10-CM | POA: Diagnosis not present

## 2023-08-01 DIAGNOSIS — I10 Essential (primary) hypertension: Secondary | ICD-10-CM | POA: Diagnosis not present

## 2023-08-01 NOTE — Telephone Encounter (Signed)
 Returned call from [place] with [employer].  Advised that pt has not been seen in office since 02/05/23.  Last dated appointment was scheduled for 4/28 and pt was a no show.  Oakley Bellman mentioned that he was last seen in house by a PCP.  Was unable to see any record of that visit.  Advised to to tasha that patient no show their cardiology appointment for 07/26/23 as well.  Tasha verbalized understanding and stated that she will relay information with her office.

## 2023-08-07 ENCOUNTER — Telehealth: Payer: Self-pay

## 2023-08-07 DIAGNOSIS — G8929 Other chronic pain: Secondary | ICD-10-CM | POA: Diagnosis not present

## 2023-08-07 DIAGNOSIS — I509 Heart failure, unspecified: Secondary | ICD-10-CM | POA: Diagnosis not present

## 2023-08-07 NOTE — Telephone Encounter (Signed)
 Copied from CRM [id number]. Topic: General - Other >> Aug 07, 2023 11:25 AM Howard Macho wrote: Reason for CRM: lydia from [facility] called wanting to see if the patient has a current appointment so that they can get orders signed

## 2023-08-08 DIAGNOSIS — I255 Ischemic cardiomyopathy: Secondary | ICD-10-CM | POA: Diagnosis not present

## 2023-08-08 DIAGNOSIS — Z515 Encounter for palliative care: Secondary | ICD-10-CM | POA: Diagnosis not present

## 2023-08-08 DIAGNOSIS — I2510 Atherosclerotic heart disease of native coronary artery without angina pectoris: Secondary | ICD-10-CM | POA: Diagnosis not present

## 2023-08-08 NOTE — Telephone Encounter (Signed)
 Left voicemail for [facility].

## 2023-08-09 IMAGING — DX DG HIP (WITH OR WITHOUT PELVIS) 2-3V*L*
3 series · 3 of 3 positions shown · non-contrast
Comparison: None.

CLINICAL DATA: Left hip stiffness and decreased range of motion.

EXAM:
DG HIP (WITH OR WITHOUT PELVIS) 2-3V LEFT

[pelvis ap]
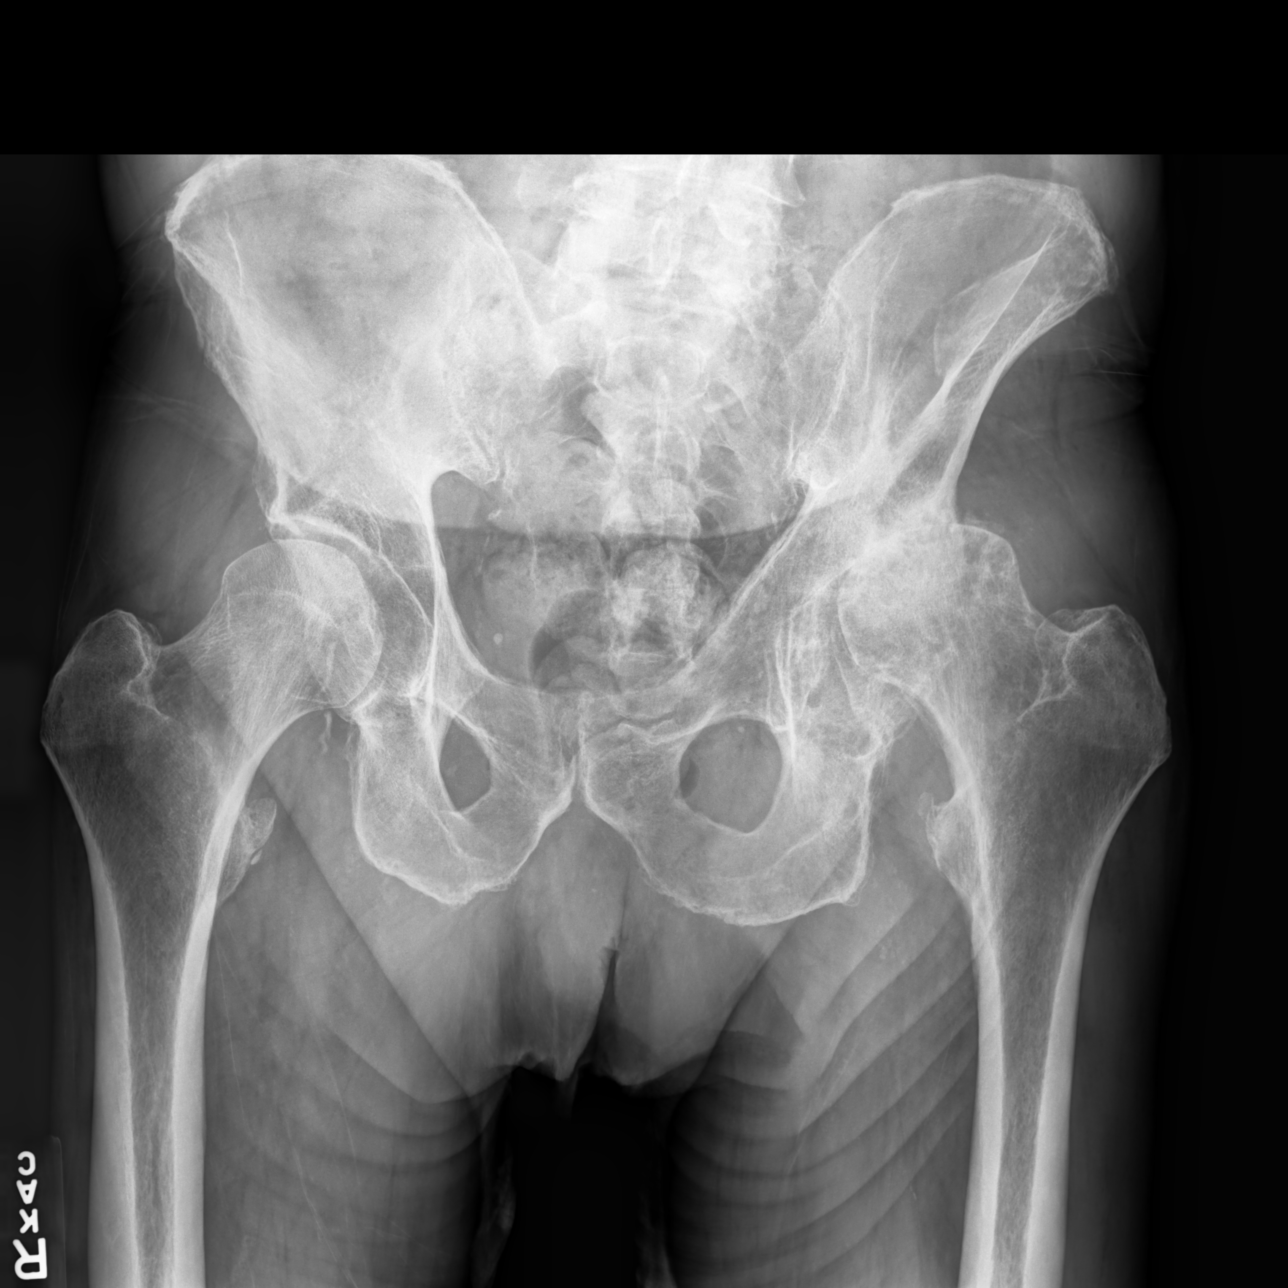

[hip ap]
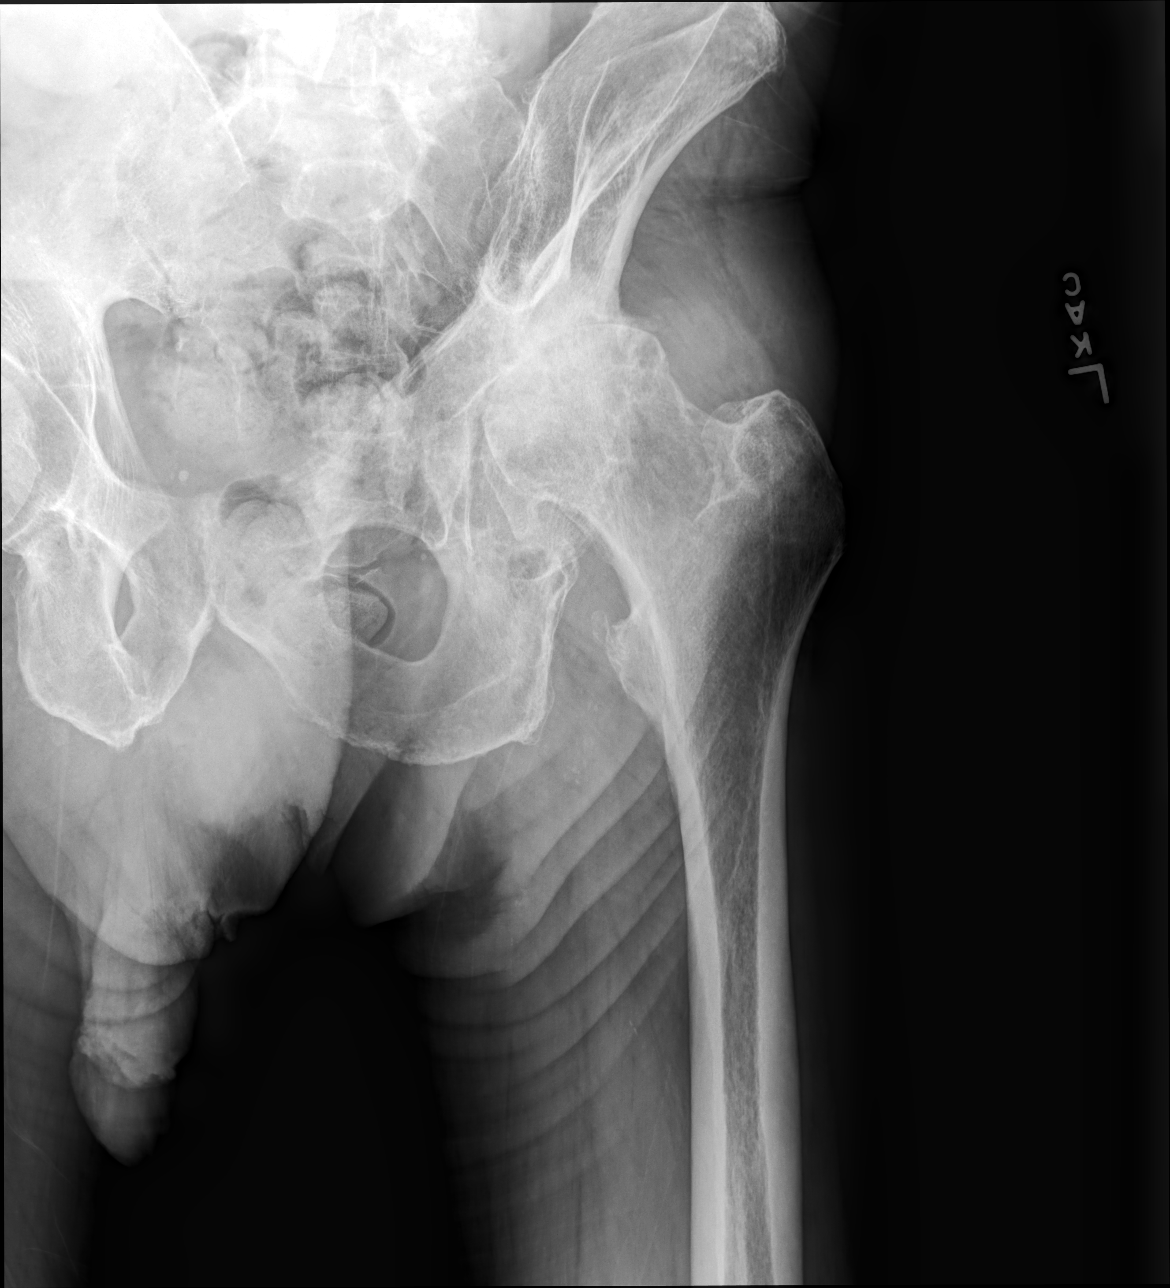

[hip (frog leg)]
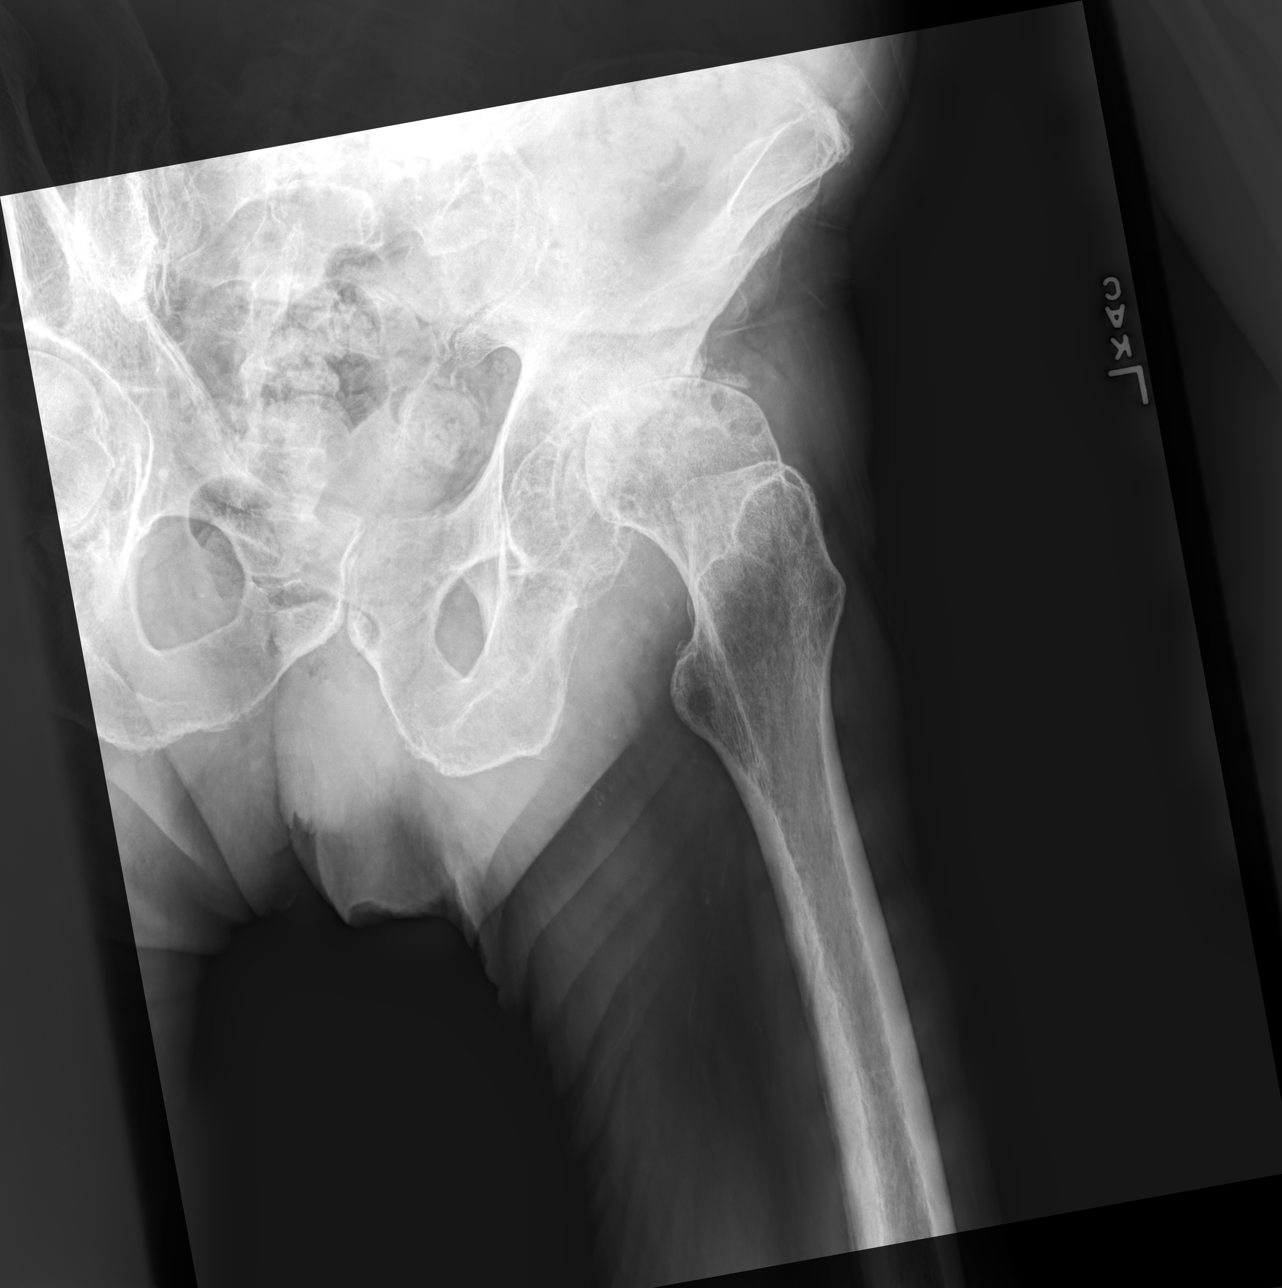

[3 of 3 positions shown; findings below may reference images not displayed]

FINDINGS: Severe degenerative changes in the left hip with complete loss of
superior joint space. The femoral head on the left is also subluxed
superiorly and laterally but not dislocated. No other significant
abnormalities.
IMPRESSION: Severe degenerative changes in the left hip with complete loss of
superior joint space. The femoral head is mildly malformed, likely
chronic. Subchondral lucencies are identified in the femoral head
and acetabulum. The femoral head is subluxed superiorly and
laterally but not dislocated. No other abnormalities.

## 2023-08-10 DIAGNOSIS — E43 Unspecified severe protein-calorie malnutrition: Secondary | ICD-10-CM | POA: Diagnosis not present

## 2023-08-10 DIAGNOSIS — Z7984 Long term (current) use of oral hypoglycemic drugs: Secondary | ICD-10-CM | POA: Diagnosis not present

## 2023-08-10 DIAGNOSIS — K219 Gastro-esophageal reflux disease without esophagitis: Secondary | ICD-10-CM | POA: Diagnosis not present

## 2023-08-10 DIAGNOSIS — Z9181 History of falling: Secondary | ICD-10-CM | POA: Diagnosis not present

## 2023-08-10 DIAGNOSIS — I5042 Chronic combined systolic (congestive) and diastolic (congestive) heart failure: Secondary | ICD-10-CM | POA: Diagnosis not present

## 2023-08-10 DIAGNOSIS — I2510 Atherosclerotic heart disease of native coronary artery without angina pectoris: Secondary | ICD-10-CM | POA: Diagnosis not present

## 2023-08-10 DIAGNOSIS — R6520 Severe sepsis without septic shock: Secondary | ICD-10-CM | POA: Diagnosis not present

## 2023-08-10 DIAGNOSIS — E785 Hyperlipidemia, unspecified: Secondary | ICD-10-CM | POA: Diagnosis not present

## 2023-08-10 DIAGNOSIS — Z556 Problems related to health literacy: Secondary | ICD-10-CM | POA: Diagnosis not present

## 2023-08-10 DIAGNOSIS — Z7982 Long term (current) use of aspirin: Secondary | ICD-10-CM | POA: Diagnosis not present

## 2023-08-10 DIAGNOSIS — Z951 Presence of aortocoronary bypass graft: Secondary | ICD-10-CM | POA: Diagnosis not present

## 2023-08-10 DIAGNOSIS — A419 Sepsis, unspecified organism: Secondary | ICD-10-CM | POA: Diagnosis not present

## 2023-08-10 DIAGNOSIS — D649 Anemia, unspecified: Secondary | ICD-10-CM | POA: Diagnosis not present

## 2023-08-10 DIAGNOSIS — S51011D Laceration without foreign body of right elbow, subsequent encounter: Secondary | ICD-10-CM | POA: Diagnosis not present

## 2023-08-10 DIAGNOSIS — I110 Hypertensive heart disease with heart failure: Secondary | ICD-10-CM | POA: Diagnosis not present

## 2023-08-14 DIAGNOSIS — S51011D Laceration without foreign body of right elbow, subsequent encounter: Secondary | ICD-10-CM | POA: Diagnosis not present

## 2023-08-14 DIAGNOSIS — D649 Anemia, unspecified: Secondary | ICD-10-CM | POA: Diagnosis not present

## 2023-08-14 DIAGNOSIS — Z7982 Long term (current) use of aspirin: Secondary | ICD-10-CM | POA: Diagnosis not present

## 2023-08-14 DIAGNOSIS — I110 Hypertensive heart disease with heart failure: Secondary | ICD-10-CM | POA: Diagnosis not present

## 2023-08-14 DIAGNOSIS — E785 Hyperlipidemia, unspecified: Secondary | ICD-10-CM | POA: Diagnosis not present

## 2023-08-14 DIAGNOSIS — Z9181 History of falling: Secondary | ICD-10-CM | POA: Diagnosis not present

## 2023-08-14 DIAGNOSIS — R6520 Severe sepsis without septic shock: Secondary | ICD-10-CM | POA: Diagnosis not present

## 2023-08-14 DIAGNOSIS — K219 Gastro-esophageal reflux disease without esophagitis: Secondary | ICD-10-CM | POA: Diagnosis not present

## 2023-08-14 DIAGNOSIS — I2510 Atherosclerotic heart disease of native coronary artery without angina pectoris: Secondary | ICD-10-CM | POA: Diagnosis not present

## 2023-08-14 DIAGNOSIS — Z951 Presence of aortocoronary bypass graft: Secondary | ICD-10-CM | POA: Diagnosis not present

## 2023-08-14 DIAGNOSIS — Z556 Problems related to health literacy: Secondary | ICD-10-CM | POA: Diagnosis not present

## 2023-08-14 DIAGNOSIS — A419 Sepsis, unspecified organism: Secondary | ICD-10-CM | POA: Diagnosis not present

## 2023-08-14 DIAGNOSIS — E43 Unspecified severe protein-calorie malnutrition: Secondary | ICD-10-CM | POA: Diagnosis not present

## 2023-08-14 DIAGNOSIS — Z7984 Long term (current) use of oral hypoglycemic drugs: Secondary | ICD-10-CM | POA: Diagnosis not present

## 2023-08-14 DIAGNOSIS — I5042 Chronic combined systolic (congestive) and diastolic (congestive) heart failure: Secondary | ICD-10-CM | POA: Diagnosis not present

## 2023-08-16 ENCOUNTER — Encounter: Payer: Self-pay | Admitting: Pharmacist

## 2023-08-16 NOTE — Progress Notes (Signed)
 Pharmacy Quality Measure Review  This patient is appearing on a report for being at risk of failing the adherence measure for cholesterol (statin) medications this calendar year.   Medication: ATORVASTATIN  80 MG Last fill date: 07/04/23 for 14 day supply  Insurance report was not up to date. No action needed at this time.  Medication has been refilled as of 5/20 for 28 day supply. Refills unclear.

## 2023-08-20 DIAGNOSIS — I509 Heart failure, unspecified: Secondary | ICD-10-CM | POA: Diagnosis not present

## 2023-08-20 DIAGNOSIS — G8929 Other chronic pain: Secondary | ICD-10-CM | POA: Diagnosis not present

## 2023-08-21 DIAGNOSIS — R2681 Unsteadiness on feet: Secondary | ICD-10-CM | POA: Diagnosis not present

## 2023-08-21 DIAGNOSIS — I63512 Cerebral infarction due to unspecified occlusion or stenosis of left middle cerebral artery: Secondary | ICD-10-CM | POA: Diagnosis not present

## 2023-08-21 DIAGNOSIS — I6522 Occlusion and stenosis of left carotid artery: Secondary | ICD-10-CM | POA: Diagnosis not present

## 2023-08-21 DIAGNOSIS — M6281 Muscle weakness (generalized): Secondary | ICD-10-CM | POA: Diagnosis not present

## 2023-09-11 DEATH — deceased
